# Patient Record
Sex: Male | Born: 1935 | Race: White | Hispanic: No | Marital: Married | Smoking: Former smoker
Health system: Southern US, Community
[De-identification: ages and names within clinical notes are randomized; demographics above are authoritative.]

## PROBLEM LIST (undated history)

## (undated) DIAGNOSIS — M1990 Unspecified osteoarthritis, unspecified site: Secondary | ICD-10-CM

## (undated) DIAGNOSIS — I340 Nonrheumatic mitral (valve) insufficiency: Secondary | ICD-10-CM

## (undated) DIAGNOSIS — K922 Gastrointestinal hemorrhage, unspecified: Secondary | ICD-10-CM

## (undated) DIAGNOSIS — I4821 Permanent atrial fibrillation: Secondary | ICD-10-CM

## (undated) DIAGNOSIS — J449 Chronic obstructive pulmonary disease, unspecified: Secondary | ICD-10-CM

## (undated) DIAGNOSIS — D649 Anemia, unspecified: Secondary | ICD-10-CM

## (undated) DIAGNOSIS — F419 Anxiety disorder, unspecified: Secondary | ICD-10-CM

## (undated) DIAGNOSIS — I5042 Chronic combined systolic (congestive) and diastolic (congestive) heart failure: Secondary | ICD-10-CM

## (undated) DIAGNOSIS — G629 Polyneuropathy, unspecified: Secondary | ICD-10-CM

## (undated) DIAGNOSIS — I639 Cerebral infarction, unspecified: Secondary | ICD-10-CM

## (undated) DIAGNOSIS — K31811 Angiodysplasia of stomach and duodenum with bleeding: Secondary | ICD-10-CM

## (undated) DIAGNOSIS — T7840XA Allergy, unspecified, initial encounter: Secondary | ICD-10-CM

## (undated) DIAGNOSIS — Z860100 Personal history of colon polyps, unspecified: Secondary | ICD-10-CM

## (undated) DIAGNOSIS — I2510 Atherosclerotic heart disease of native coronary artery without angina pectoris: Secondary | ICD-10-CM

## (undated) DIAGNOSIS — E785 Hyperlipidemia, unspecified: Secondary | ICD-10-CM

## (undated) DIAGNOSIS — I219 Acute myocardial infarction, unspecified: Secondary | ICD-10-CM

## (undated) HISTORY — DX: Nonrheumatic mitral (valve) insufficiency: I34.0

## (undated) HISTORY — PX: FOOT FRACTURE SURGERY: SHX645

## (undated) HISTORY — PX: MASTOIDECTOMY: SHX711

## (undated) HISTORY — PX: FRACTURE SURGERY: SHX138

## (undated) HISTORY — DX: Personal history of colonic polyps: Z86.010

## (undated) HISTORY — PX: CARPAL TUNNEL RELEASE: SHX101

## (undated) HISTORY — DX: Permanent atrial fibrillation: I48.21

## (undated) HISTORY — PX: PENILE PROSTHESIS IMPLANT: SHX240

## (undated) HISTORY — PX: KNEE ARTHROSCOPY: SUR90

## (undated) HISTORY — PX: COLONOSCOPY: SHX174

## (undated) HISTORY — DX: Anemia, unspecified: D64.9

## (undated) HISTORY — PX: SHOULDER ARTHROSCOPY: SHX128

## (undated) HISTORY — DX: Cerebral infarction, unspecified: I63.9

## (undated) HISTORY — DX: Anxiety disorder, unspecified: F41.9

## (undated) HISTORY — DX: Allergy, unspecified, initial encounter: T78.40XA

## (undated) HISTORY — PX: APPENDECTOMY: SHX54

## (undated) HISTORY — PX: NASAL SEPTUM SURGERY: SHX37

## (undated) HISTORY — DX: Hyperlipidemia, unspecified: E78.5

## (undated) HISTORY — DX: Chronic obstructive pulmonary disease, unspecified: J44.9

## (undated) HISTORY — DX: Unspecified osteoarthritis, unspecified site: M19.90

## (undated) HISTORY — PX: PENILE PROSTHESIS  REMOVAL: SHX2202

## (undated) HISTORY — DX: Chronic combined systolic (congestive) and diastolic (congestive) heart failure: I50.42

## (undated) HISTORY — DX: Personal history of colon polyps, unspecified: Z86.0100

## (undated) HISTORY — PX: TONSILLECTOMY AND ADENOIDECTOMY: SUR1326

## (undated) HISTORY — DX: Atherosclerotic heart disease of native coronary artery without angina pectoris: I25.10

## (undated) HISTORY — PX: PILONIDAL CYST / SINUS EXCISION: SUR543

---

## 1997-09-07 ENCOUNTER — Other Ambulatory Visit: Admission: RE | Admit: 1997-09-07 | Discharge: 1997-09-07 | Payer: Self-pay | Admitting: Geriatric Medicine

## 1999-11-28 ENCOUNTER — Encounter (INDEPENDENT_AMBULATORY_CARE_PROVIDER_SITE_OTHER): Payer: Self-pay | Admitting: *Deleted

## 1999-11-28 ENCOUNTER — Ambulatory Visit (HOSPITAL_COMMUNITY): Admission: RE | Admit: 1999-11-28 | Discharge: 1999-11-28 | Payer: Self-pay | Admitting: Gastroenterology

## 2000-06-11 DIAGNOSIS — M1990 Unspecified osteoarthritis, unspecified site: Secondary | ICD-10-CM

## 2000-06-11 HISTORY — DX: Unspecified osteoarthritis, unspecified site: M19.90

## 2001-04-04 ENCOUNTER — Encounter: Admission: RE | Admit: 2001-04-04 | Discharge: 2001-04-04 | Payer: Self-pay | Admitting: Geriatric Medicine

## 2001-04-04 ENCOUNTER — Encounter: Payer: Self-pay | Admitting: Geriatric Medicine

## 2004-07-01 ENCOUNTER — Encounter: Admission: RE | Admit: 2004-07-01 | Discharge: 2004-07-01 | Payer: Self-pay | Admitting: Orthopedic Surgery

## 2004-07-11 ENCOUNTER — Encounter: Admission: RE | Admit: 2004-07-11 | Discharge: 2004-07-11 | Payer: Self-pay | Admitting: Orthopedic Surgery

## 2004-07-13 ENCOUNTER — Ambulatory Visit (HOSPITAL_BASED_OUTPATIENT_CLINIC_OR_DEPARTMENT_OTHER): Admission: RE | Admit: 2004-07-13 | Discharge: 2004-07-13 | Payer: Self-pay | Admitting: Orthopedic Surgery

## 2004-07-13 ENCOUNTER — Ambulatory Visit (HOSPITAL_COMMUNITY): Admission: RE | Admit: 2004-07-13 | Discharge: 2004-07-13 | Payer: Self-pay | Admitting: Orthopedic Surgery

## 2004-07-18 ENCOUNTER — Encounter: Payer: Self-pay | Admitting: Internal Medicine

## 2004-07-27 ENCOUNTER — Ambulatory Visit (HOSPITAL_COMMUNITY): Admission: RE | Admit: 2004-07-27 | Discharge: 2004-07-27 | Payer: Self-pay | Admitting: Orthopedic Surgery

## 2004-07-27 ENCOUNTER — Ambulatory Visit (HOSPITAL_BASED_OUTPATIENT_CLINIC_OR_DEPARTMENT_OTHER): Admission: RE | Admit: 2004-07-27 | Discharge: 2004-07-27 | Payer: Self-pay | Admitting: Orthopedic Surgery

## 2004-09-20 ENCOUNTER — Ambulatory Visit (HOSPITAL_COMMUNITY): Admission: RE | Admit: 2004-09-20 | Discharge: 2004-09-20 | Payer: Self-pay | Admitting: Gastroenterology

## 2004-11-08 ENCOUNTER — Encounter: Payer: Self-pay | Admitting: Internal Medicine

## 2007-04-16 ENCOUNTER — Encounter: Payer: Self-pay | Admitting: Internal Medicine

## 2007-09-26 ENCOUNTER — Ambulatory Visit (HOSPITAL_COMMUNITY): Admission: RE | Admit: 2007-09-26 | Discharge: 2007-09-26 | Payer: Self-pay | Admitting: Geriatric Medicine

## 2008-04-14 ENCOUNTER — Ambulatory Visit: Payer: Self-pay | Admitting: Internal Medicine

## 2008-04-14 DIAGNOSIS — I10 Essential (primary) hypertension: Secondary | ICD-10-CM | POA: Insufficient documentation

## 2008-04-14 DIAGNOSIS — F39 Unspecified mood [affective] disorder: Secondary | ICD-10-CM

## 2008-04-14 DIAGNOSIS — J439 Emphysema, unspecified: Secondary | ICD-10-CM

## 2008-04-14 DIAGNOSIS — D649 Anemia, unspecified: Secondary | ICD-10-CM | POA: Insufficient documentation

## 2008-04-14 DIAGNOSIS — E785 Hyperlipidemia, unspecified: Secondary | ICD-10-CM | POA: Insufficient documentation

## 2008-04-14 DIAGNOSIS — I482 Chronic atrial fibrillation, unspecified: Secondary | ICD-10-CM

## 2008-04-14 DIAGNOSIS — I4891 Unspecified atrial fibrillation: Secondary | ICD-10-CM | POA: Insufficient documentation

## 2008-04-14 DIAGNOSIS — I2510 Atherosclerotic heart disease of native coronary artery without angina pectoris: Secondary | ICD-10-CM | POA: Insufficient documentation

## 2008-04-16 ENCOUNTER — Encounter: Payer: Self-pay | Admitting: Internal Medicine

## 2008-04-19 LAB — CONVERTED CEMR LAB
ALT: 30 units/L (ref 0–53)
AST: 32 units/L (ref 0–37)
Albumin: 4.3 g/dL (ref 3.5–5.2)
Alkaline Phosphatase: 52 units/L (ref 39–117)
BUN: 20 mg/dL (ref 6–23)
Basophils Absolute: 0 10*3/uL (ref 0.0–0.1)
Basophils Relative: 0.3 % (ref 0.0–3.0)
Bilirubin, Direct: 0.1 mg/dL (ref 0.0–0.3)
CO2: 31 meq/L (ref 19–32)
Calcium: 10.1 mg/dL (ref 8.4–10.5)
Chloride: 104 meq/L (ref 96–112)
Cholesterol: 123 mg/dL (ref 0–200)
Creatinine, Ser: 1 mg/dL (ref 0.4–1.5)
Direct LDL: 62.4 mg/dL
Eosinophils Absolute: 0.3 10*3/uL (ref 0.0–0.7)
Eosinophils Relative: 5.2 % — ABNORMAL HIGH (ref 0.0–5.0)
GFR calc Af Amer: 94 mL/min
GFR calc non Af Amer: 78 mL/min
Glucose, Bld: 85 mg/dL (ref 70–99)
HCT: 38.8 % — ABNORMAL LOW (ref 39.0–52.0)
HDL: 32.8 mg/dL — ABNORMAL LOW (ref >39.0)
Hemoglobin: 13.1 g/dL (ref 13.0–17.0)
Lymphocytes Relative: 29.3 % (ref 12.0–46.0)
MCHC: 33.9 g/dL (ref 30.0–36.0)
MCV: 87.6 fL (ref 78.0–100.0)
Monocytes Absolute: 0.5 10*3/uL (ref 0.1–1.0)
Monocytes Relative: 8.7 % (ref 3.0–12.0)
Neutro Abs: 3.5 10*3/uL (ref 1.4–7.7)
Neutrophils Relative %: 56.5 % (ref 43.0–77.0)
Phosphorus: 3.9 mg/dL (ref 2.3–4.6)
Platelets: 225 10*3/uL (ref 150–400)
Potassium: 4.8 meq/L (ref 3.5–5.1)
RBC: 4.43 M/uL (ref 4.22–5.81)
RDW: 13.1 % (ref 11.5–14.6)
Sodium: 141 meq/L (ref 135–145)
TSH: 1.18 microintl units/mL (ref 0.35–5.50)
Total Bilirubin: 0.6 mg/dL (ref 0.3–1.2)
Total CHOL/HDL Ratio: 3.8
Total Protein: 7.2 g/dL (ref 6.0–8.3)
Triglycerides: 625 mg/dL (ref 0–149)
VLDL: 125 mg/dL — ABNORMAL HIGH (ref 0–40)
WBC: 6.1 10*3/uL (ref 4.5–10.5)

## 2008-06-21 ENCOUNTER — Emergency Department: Payer: Self-pay | Admitting: Emergency Medicine

## 2008-07-02 ENCOUNTER — Ambulatory Visit: Payer: Self-pay | Admitting: Internal Medicine

## 2008-08-20 ENCOUNTER — Ambulatory Visit: Payer: Self-pay | Admitting: Family Medicine

## 2008-11-11 ENCOUNTER — Ambulatory Visit: Payer: Self-pay | Admitting: Internal Medicine

## 2008-11-11 DIAGNOSIS — J309 Allergic rhinitis, unspecified: Secondary | ICD-10-CM

## 2008-11-16 ENCOUNTER — Ambulatory Visit: Payer: Self-pay | Admitting: Internal Medicine

## 2008-11-17 LAB — CONVERTED CEMR LAB
ALT: 23 units/L (ref 0–53)
AST: 33 units/L (ref 0–37)
Albumin: 4.2 g/dL (ref 3.5–5.2)
Alkaline Phosphatase: 39 units/L (ref 39–117)
BUN: 22 mg/dL (ref 6–23)
Basophils Absolute: 0 10*3/uL (ref 0.0–0.1)
Basophils Relative: 0 % (ref 0.0–3.0)
Bilirubin, Direct: 0.1 mg/dL (ref 0.0–0.3)
CO2: 31 meq/L (ref 19–32)
Calcium: 9.5 mg/dL (ref 8.4–10.5)
Chloride: 110 meq/L (ref 96–112)
Cholesterol: 139 mg/dL (ref 0–200)
Creatinine, Ser: 1.1 mg/dL (ref 0.4–1.5)
Direct LDL: 82.1 mg/dL
Eosinophils Absolute: 0.4 10*3/uL (ref 0.0–0.7)
Eosinophils Relative: 5.8 % — ABNORMAL HIGH (ref 0.0–5.0)
Glucose, Bld: 82 mg/dL (ref 70–99)
HCT: 35.3 % — ABNORMAL LOW (ref 39.0–52.0)
HDL: 41.7 mg/dL (ref >39.00)
Hemoglobin: 12.5 g/dL — ABNORMAL LOW (ref 13.0–17.0)
Lymphocytes Relative: 25.4 % (ref 12.0–46.0)
Lymphs Abs: 1.9 10*3/uL (ref 0.7–4.0)
MCHC: 35.4 g/dL (ref 30.0–36.0)
MCV: 85.3 fL (ref 78.0–100.0)
Monocytes Absolute: 0.6 10*3/uL (ref 0.1–1.0)
Monocytes Relative: 8.5 % (ref 3.0–12.0)
Neutro Abs: 4.7 10*3/uL (ref 1.4–7.7)
Neutrophils Relative %: 60.3 % (ref 43.0–77.0)
Phosphorus: 3 mg/dL (ref 2.3–4.6)
Platelets: 228 10*3/uL (ref 150.0–400.0)
Potassium: 4.9 meq/L (ref 3.5–5.1)
RBC: 4.13 M/uL — ABNORMAL LOW (ref 4.22–5.81)
RDW: 13.6 % (ref 11.5–14.6)
Sodium: 143 meq/L (ref 135–145)
Total Bilirubin: 0.7 mg/dL (ref 0.3–1.2)
Total CHOL/HDL Ratio: 3
Total Protein: 6.9 g/dL (ref 6.0–8.3)
Triglycerides: 671 mg/dL — ABNORMAL HIGH (ref 0.0–149.0)
VLDL: 134.2 mg/dL — ABNORMAL HIGH (ref 0.0–40.0)
WBC: 7.6 10*3/uL (ref 4.5–10.5)

## 2009-02-11 ENCOUNTER — Telehealth: Payer: Self-pay | Admitting: Internal Medicine

## 2009-05-31 ENCOUNTER — Ambulatory Visit: Payer: Self-pay | Admitting: Internal Medicine

## 2009-05-31 DIAGNOSIS — M1990 Unspecified osteoarthritis, unspecified site: Secondary | ICD-10-CM | POA: Insufficient documentation

## 2009-06-02 LAB — CONVERTED CEMR LAB
ALT: 20 units/L (ref 0–53)
AST: 28 units/L (ref 0–37)
Albumin: 4.3 g/dL (ref 3.5–5.2)
Alkaline Phosphatase: 38 units/L — ABNORMAL LOW (ref 39–117)
BUN: 25 mg/dL — ABNORMAL HIGH (ref 6–23)
Basophils Absolute: 0 10*3/uL (ref 0.0–0.1)
Basophils Relative: 0.3 % (ref 0.0–3.0)
Bilirubin, Direct: 0 mg/dL (ref 0.0–0.3)
CO2: 30 meq/L (ref 19–32)
Calcium: 9.8 mg/dL (ref 8.4–10.5)
Chloride: 101 meq/L (ref 96–112)
Cholesterol: 134 mg/dL (ref 0–200)
Creatinine, Ser: 1.1 mg/dL (ref 0.4–1.5)
Direct LDL: 68.2 mg/dL
Eosinophils Absolute: 0.4 10*3/uL (ref 0.0–0.7)
Eosinophils Relative: 5 % (ref 0.0–5.0)
GFR calc non Af Amer: 69.16 mL/min (ref >60)
Glucose, Bld: 76 mg/dL (ref 70–99)
HCT: 36.7 % — ABNORMAL LOW (ref 39.0–52.0)
HDL: 46.6 mg/dL (ref >39.00)
Hemoglobin: 12.2 g/dL — ABNORMAL LOW (ref 13.0–17.0)
Lymphocytes Relative: 21.1 % (ref 12.0–46.0)
Lymphs Abs: 1.7 10*3/uL (ref 0.7–4.0)
MCHC: 33.3 g/dL (ref 30.0–36.0)
MCV: 90.2 fL (ref 78.0–100.0)
Monocytes Absolute: 0.7 10*3/uL (ref 0.1–1.0)
Monocytes Relative: 8.8 % (ref 3.0–12.0)
Neutro Abs: 5.2 10*3/uL (ref 1.4–7.7)
Neutrophils Relative %: 64.8 % (ref 43.0–77.0)
Phosphorus: 3.8 mg/dL (ref 2.3–4.6)
Platelets: 254 10*3/uL (ref 150.0–400.0)
Potassium: 5.2 meq/L — ABNORMAL HIGH (ref 3.5–5.1)
RBC: 4.07 M/uL — ABNORMAL LOW (ref 4.22–5.81)
RDW: 13 % (ref 11.5–14.6)
Sodium: 139 meq/L (ref 135–145)
TSH: 1.28 microintl units/mL (ref 0.35–5.50)
Total Bilirubin: 0.7 mg/dL (ref 0.3–1.2)
Total CHOL/HDL Ratio: 3
Total Protein: 6.6 g/dL (ref 6.0–8.3)
Triglycerides: 740 mg/dL — ABNORMAL HIGH (ref 0.0–149.0)
VLDL: 148 mg/dL — ABNORMAL HIGH (ref 0.0–40.0)
WBC: 8 10*3/uL (ref 4.5–10.5)

## 2009-07-04 ENCOUNTER — Ambulatory Visit: Payer: Self-pay | Admitting: Internal Medicine

## 2009-07-29 ENCOUNTER — Telehealth: Payer: Self-pay | Admitting: Internal Medicine

## 2009-08-02 ENCOUNTER — Encounter: Payer: Self-pay | Admitting: Internal Medicine

## 2009-09-28 ENCOUNTER — Encounter: Payer: Self-pay | Admitting: Internal Medicine

## 2009-12-06 ENCOUNTER — Ambulatory Visit: Payer: Self-pay | Admitting: Internal Medicine

## 2009-12-06 DIAGNOSIS — Z860100 Personal history of colon polyps, unspecified: Secondary | ICD-10-CM | POA: Insufficient documentation

## 2009-12-08 LAB — CONVERTED CEMR LAB
ALT: 20 units/L (ref 0–53)
AST: 26 units/L (ref 0–37)
Albumin: 4.3 g/dL (ref 3.5–5.2)
Alkaline Phosphatase: 51 units/L (ref 39–117)
BUN: 24 mg/dL — ABNORMAL HIGH (ref 6–23)
Basophils Absolute: 0 10*3/uL (ref 0.0–0.1)
Basophils Relative: 0.5 % (ref 0.0–3.0)
Bilirubin, Direct: 0.2 mg/dL (ref 0.0–0.3)
CO2: 31 meq/L (ref 19–32)
Calcium: 9.8 mg/dL (ref 8.4–10.5)
Chloride: 107 meq/L (ref 96–112)
Cholesterol: 136 mg/dL (ref 0–200)
Creatinine, Ser: 1 mg/dL (ref 0.4–1.5)
Direct LDL: 70.1 mg/dL
Eosinophils Absolute: 0.4 10*3/uL (ref 0.0–0.7)
Eosinophils Relative: 5.3 % — ABNORMAL HIGH (ref 0.0–5.0)
GFR calc non Af Amer: 77.99 mL/min (ref >60)
Glucose, Bld: 80 mg/dL (ref 70–99)
HCT: 36.1 % — ABNORMAL LOW (ref 39.0–52.0)
HDL: 45.7 mg/dL (ref >39.00)
Hemoglobin: 12.3 g/dL — ABNORMAL LOW (ref 13.0–17.0)
Lymphocytes Relative: 19.2 % (ref 12.0–46.0)
Lymphs Abs: 1.4 10*3/uL (ref 0.7–4.0)
MCHC: 33.9 g/dL (ref 30.0–36.0)
MCV: 88.9 fL (ref 78.0–100.0)
Monocytes Absolute: 0.7 10*3/uL (ref 0.1–1.0)
Monocytes Relative: 9.7 % (ref 3.0–12.0)
Neutro Abs: 4.8 10*3/uL (ref 1.4–7.7)
Neutrophils Relative %: 65.3 % (ref 43.0–77.0)
Phosphorus: 3.8 mg/dL (ref 2.3–4.6)
Platelets: 229 10*3/uL (ref 150.0–400.0)
Potassium: 5 meq/L (ref 3.5–5.1)
RBC: 4.06 M/uL — ABNORMAL LOW (ref 4.22–5.81)
RDW: 14.5 % (ref 11.5–14.6)
Sodium: 142 meq/L (ref 135–145)
TSH: 1.11 microintl units/mL (ref 0.35–5.50)
Total Bilirubin: 0.5 mg/dL (ref 0.3–1.2)
Total CHOL/HDL Ratio: 3
Total Protein: 7.1 g/dL (ref 6.0–8.3)
Triglycerides: 714 mg/dL — ABNORMAL HIGH (ref 0.0–149.0)
VLDL: 142.8 mg/dL — ABNORMAL HIGH (ref 0.0–40.0)
WBC: 7.4 10*3/uL (ref 4.5–10.5)

## 2010-01-17 ENCOUNTER — Ambulatory Visit: Payer: Self-pay | Admitting: Gastroenterology

## 2010-02-16 ENCOUNTER — Telehealth: Payer: Self-pay | Admitting: Gastroenterology

## 2010-02-17 ENCOUNTER — Ambulatory Visit: Payer: Self-pay | Admitting: Gastroenterology

## 2010-02-22 ENCOUNTER — Encounter: Payer: Self-pay | Admitting: Gastroenterology

## 2010-05-30 ENCOUNTER — Encounter: Payer: Self-pay | Admitting: Internal Medicine

## 2010-06-06 ENCOUNTER — Ambulatory Visit: Payer: Self-pay | Admitting: Internal Medicine

## 2010-06-06 DIAGNOSIS — R21 Rash and other nonspecific skin eruption: Secondary | ICD-10-CM

## 2010-06-11 DIAGNOSIS — I2510 Atherosclerotic heart disease of native coronary artery without angina pectoris: Secondary | ICD-10-CM

## 2010-06-11 DIAGNOSIS — I4821 Permanent atrial fibrillation: Secondary | ICD-10-CM

## 2010-06-11 HISTORY — DX: Permanent atrial fibrillation: I48.21

## 2010-06-11 HISTORY — DX: Atherosclerotic heart disease of native coronary artery without angina pectoris: I25.10

## 2010-06-11 HISTORY — PX: CARDIAC CATHETERIZATION: SHX172

## 2010-06-15 ENCOUNTER — Inpatient Hospital Stay (HOSPITAL_COMMUNITY)
Admission: RE | Admit: 2010-06-15 | Discharge: 2010-06-22 | Payer: Self-pay | Source: Home / Self Care | Attending: Interventional Cardiology | Admitting: Interventional Cardiology

## 2010-06-15 LAB — CARDIAC PANEL(CRET KIN+CKTOT+MB+TROPI)
CK, MB: 16.6 ng/mL (ref 0.3–4.0)
Relative Index: 8.7 — ABNORMAL HIGH (ref 0.0–2.5)
Total CK: 190 U/L (ref 7–232)
Troponin I: 0.25 ng/mL — ABNORMAL HIGH (ref 0.00–0.06)

## 2010-06-15 LAB — PROTIME-INR
INR: 1.12 (ref 0.00–1.49)
Prothrombin Time: 14.6 seconds (ref 11.6–15.2)

## 2010-06-15 LAB — MRSA PCR SCREENING: MRSA by PCR: NEGATIVE

## 2010-06-15 LAB — APTT: aPTT: 31 seconds (ref 24–37)

## 2010-06-16 LAB — BASIC METABOLIC PANEL
BUN: 25 mg/dL — ABNORMAL HIGH (ref 6–23)
CO2: 25 mEq/L (ref 19–32)
Calcium: 9.4 mg/dL (ref 8.4–10.5)
Chloride: 105 mEq/L (ref 96–112)
Creatinine, Ser: 1.11 mg/dL (ref 0.4–1.5)
GFR calc Af Amer: >60 mL/min (ref >60)
GFR calc non Af Amer: >60 mL/min (ref >60)
Glucose, Bld: 108 mg/dL — ABNORMAL HIGH (ref 70–99)
Potassium: 4 mEq/L (ref 3.5–5.1)
Sodium: 138 mEq/L (ref 135–145)

## 2010-06-16 LAB — CBC
HCT: 34.4 % — ABNORMAL LOW (ref 39.0–52.0)
Hemoglobin: 11.1 g/dL — ABNORMAL LOW (ref 13.0–17.0)
MCH: 28.3 pg (ref 26.0–34.0)
MCHC: 32.3 g/dL (ref 30.0–36.0)
MCV: 87.8 fL (ref 78.0–100.0)
Platelets: 220 10*3/uL (ref 150–400)
RBC: 3.92 MIL/uL — ABNORMAL LOW (ref 4.22–5.81)
RDW: 14 % (ref 11.5–15.5)
WBC: 10.8 10*3/uL — ABNORMAL HIGH (ref 4.0–10.5)

## 2010-06-16 LAB — CARDIAC PANEL(CRET KIN+CKTOT+MB+TROPI)
CK, MB: 199.3 ng/mL (ref 0.3–4.0)
CK, MB: 59.5 ng/mL (ref 0.3–4.0)
Relative Index: 15.7 — ABNORMAL HIGH (ref 0.0–2.5)
Relative Index: 17.4 — ABNORMAL HIGH (ref 0.0–2.5)
Total CK: 1146 U/L — ABNORMAL HIGH (ref 7–232)
Total CK: 379 U/L — ABNORMAL HIGH (ref 7–232)
Troponin I: 1.63 ng/mL (ref 0.00–0.06)
Troponin I: 18.56 ng/mL (ref 0.00–0.06)

## 2010-06-16 LAB — HEPARIN LEVEL (UNFRACTIONATED)
Heparin Unfractionated: 0.25 IU/mL — ABNORMAL LOW (ref 0.30–0.70)
Heparin Unfractionated: 0.38 IU/mL (ref 0.30–0.70)
Heparin Unfractionated: <0.1 IU/mL — ABNORMAL LOW (ref 0.30–0.70)

## 2010-06-26 LAB — PROTIME-INR
INR: 1.83 — ABNORMAL HIGH (ref 0.00–1.49)
INR: 1.92 — ABNORMAL HIGH (ref 0.00–1.49)
INR: 1.86 — ABNORMAL HIGH (ref 0.00–1.49)
INR: 1.92 — ABNORMAL HIGH (ref 0.00–1.49)
INR: 2.52 — ABNORMAL HIGH (ref 0.00–1.49)
Prothrombin Time: 21.3 seconds — ABNORMAL HIGH (ref 11.6–15.2)
Prothrombin Time: 22.1 seconds — ABNORMAL HIGH (ref 11.6–15.2)
Prothrombin Time: 21.6 seconds — ABNORMAL HIGH (ref 11.6–15.2)
Prothrombin Time: 22.1 seconds — ABNORMAL HIGH (ref 11.6–15.2)
Prothrombin Time: 27.3 seconds — ABNORMAL HIGH (ref 11.6–15.2)

## 2010-06-26 LAB — CK TOTAL AND CKMB (NOT AT ARMC)
CK, MB: 107.9 ng/mL (ref 0.3–4.0)
CK, MB: 92.9 ng/mL (ref 0.3–4.0)
CK, MB: 19.8 ng/mL (ref 0.3–4.0)
Relative Index: 10.1 — ABNORMAL HIGH (ref 0.0–2.5)
Relative Index: 8.8 — ABNORMAL HIGH (ref 0.0–2.5)
Relative Index: 4 — ABNORMAL HIGH (ref 0.0–2.5)
Total CK: 1057 U/L — ABNORMAL HIGH (ref 7–232)
Total CK: 1069 U/L — ABNORMAL HIGH (ref 7–232)
Total CK: 492 U/L — ABNORMAL HIGH (ref 7–232)

## 2010-06-26 LAB — DIFFERENTIAL
Basophils Absolute: 0 10*3/uL (ref 0.0–0.1)
Basophils Absolute: 0 10*3/uL (ref 0.0–0.1)
Basophils Absolute: 0 10*3/uL (ref 0.0–0.1)
Basophils Relative: 0 % (ref 0–1)
Basophils Relative: 0 % (ref 0–1)
Basophils Relative: 0 % (ref 0–1)
Eosinophils Absolute: 0 10*3/uL (ref 0.0–0.7)
Eosinophils Absolute: 0 10*3/uL (ref 0.0–0.7)
Eosinophils Absolute: 0.2 10*3/uL (ref 0.0–0.7)
Eosinophils Relative: 0 % (ref 0–5)
Eosinophils Relative: 0 % (ref 0–5)
Eosinophils Relative: 1 % (ref 0–5)
Lymphocytes Relative: 7 % — ABNORMAL LOW (ref 12–46)
Lymphocytes Relative: 4 % — ABNORMAL LOW (ref 12–46)
Lymphocytes Relative: 8 % — ABNORMAL LOW (ref 12–46)
Lymphs Abs: 1.5 10*3/uL (ref 0.7–4.0)
Lymphs Abs: 0.7 10*3/uL (ref 0.7–4.0)
Lymphs Abs: 1 10*3/uL (ref 0.7–4.0)
Monocytes Absolute: 2.1 10*3/uL — ABNORMAL HIGH (ref 0.1–1.0)
Monocytes Absolute: 1.7 10*3/uL — ABNORMAL HIGH (ref 0.1–1.0)
Monocytes Absolute: 1.6 10*3/uL — ABNORMAL HIGH (ref 0.1–1.0)
Monocytes Relative: 10 % (ref 3–12)
Monocytes Relative: 11 % (ref 3–12)
Monocytes Relative: 13 % — ABNORMAL HIGH (ref 3–12)
Neutro Abs: 16.8 10*3/uL — ABNORMAL HIGH (ref 1.7–7.7)
Neutro Abs: 12.6 10*3/uL — ABNORMAL HIGH (ref 1.7–7.7)
Neutro Abs: 9.5 10*3/uL — ABNORMAL HIGH (ref 1.7–7.7)
Neutrophils Relative %: 82 % — ABNORMAL HIGH (ref 43–77)
Neutrophils Relative %: 84 % — ABNORMAL HIGH (ref 43–77)
Neutrophils Relative %: 77 % (ref 43–77)

## 2010-06-26 LAB — CBC
HCT: 39.3 % (ref 39.0–52.0)
HCT: 32.5 % — ABNORMAL LOW (ref 39.0–52.0)
HCT: 31.5 % — ABNORMAL LOW (ref 39.0–52.0)
HCT: 35.5 % — ABNORMAL LOW (ref 39.0–52.0)
HCT: 35.8 % — ABNORMAL LOW (ref 39.0–52.0)
HCT: 37.8 % — ABNORMAL LOW (ref 39.0–52.0)
Hemoglobin: 12.9 g/dL — ABNORMAL LOW (ref 13.0–17.0)
Hemoglobin: 11 g/dL — ABNORMAL LOW (ref 13.0–17.0)
Hemoglobin: 10.6 g/dL — ABNORMAL LOW (ref 13.0–17.0)
Hemoglobin: 12 g/dL — ABNORMAL LOW (ref 13.0–17.0)
Hemoglobin: 12.2 g/dL — ABNORMAL LOW (ref 13.0–17.0)
Hemoglobin: 12.4 g/dL — ABNORMAL LOW (ref 13.0–17.0)
MCH: 29.1 pg (ref 26.0–34.0)
MCH: 29.3 pg (ref 26.0–34.0)
MCH: 28.9 pg (ref 26.0–34.0)
MCH: 28.6 pg (ref 26.0–34.0)
MCH: 29.1 pg (ref 26.0–34.0)
MCH: 28.9 pg (ref 26.0–34.0)
MCHC: 32.8 g/dL (ref 30.0–36.0)
MCHC: 33.8 g/dL (ref 30.0–36.0)
MCHC: 33.7 g/dL (ref 30.0–36.0)
MCHC: 33.8 g/dL (ref 30.0–36.0)
MCHC: 34.1 g/dL (ref 30.0–36.0)
MCHC: 32.8 g/dL (ref 30.0–36.0)
MCV: 88.5 fL (ref 78.0–100.0)
MCV: 86.7 fL (ref 78.0–100.0)
MCV: 85.8 fL (ref 78.0–100.0)
MCV: 84.5 fL (ref 78.0–100.0)
MCV: 85.4 fL (ref 78.0–100.0)
MCV: 88.1 fL (ref 78.0–100.0)
Platelets: 234 10*3/uL (ref 150–400)
Platelets: 168 10*3/uL (ref 150–400)
Platelets: 168 10*3/uL (ref 150–400)
Platelets: 223 10*3/uL (ref 150–400)
Platelets: 226 10*3/uL (ref 150–400)
Platelets: 255 10*3/uL (ref 150–400)
RBC: 4.44 MIL/uL (ref 4.22–5.81)
RBC: 3.75 MIL/uL — ABNORMAL LOW (ref 4.22–5.81)
RBC: 3.67 MIL/uL — ABNORMAL LOW (ref 4.22–5.81)
RBC: 4.2 MIL/uL — ABNORMAL LOW (ref 4.22–5.81)
RBC: 4.19 MIL/uL — ABNORMAL LOW (ref 4.22–5.81)
RBC: 4.29 MIL/uL (ref 4.22–5.81)
RDW: 14.2 % (ref 11.5–15.5)
RDW: 13.9 % (ref 11.5–15.5)
RDW: 13.6 % (ref 11.5–15.5)
RDW: 13.4 % (ref 11.5–15.5)
RDW: 13.6 % (ref 11.5–15.5)
RDW: 14 % (ref 11.5–15.5)
WBC: 22.2 10*3/uL — ABNORMAL HIGH (ref 4.0–10.5)
WBC: 20.5 10*3/uL — ABNORMAL HIGH (ref 4.0–10.5)
WBC: 15 10*3/uL — ABNORMAL HIGH (ref 4.0–10.5)
WBC: 12.3 10*3/uL — ABNORMAL HIGH (ref 4.0–10.5)
WBC: 10.2 10*3/uL (ref 4.0–10.5)
WBC: 12.1 10*3/uL — ABNORMAL HIGH (ref 4.0–10.5)

## 2010-06-26 LAB — COMPREHENSIVE METABOLIC PANEL
ALT: 2369 U/L — ABNORMAL HIGH (ref 0–53)
AST: 2565 U/L — ABNORMAL HIGH (ref 0–37)
Albumin: 3.6 g/dL (ref 3.5–5.2)
Alkaline Phosphatase: 82 U/L (ref 39–117)
BUN: 74 mg/dL — ABNORMAL HIGH (ref 6–23)
CO2: 19 mEq/L (ref 19–32)
Calcium: 8.6 mg/dL (ref 8.4–10.5)
Chloride: 101 mEq/L (ref 96–112)
Creatinine, Ser: 4.76 mg/dL — ABNORMAL HIGH (ref 0.4–1.5)
GFR calc Af Amer: 14 mL/min — ABNORMAL LOW (ref >60)
GFR calc non Af Amer: 12 mL/min — ABNORMAL LOW (ref >60)
Glucose, Bld: 102 mg/dL — ABNORMAL HIGH (ref 70–99)
Potassium: 4.3 mEq/L (ref 3.5–5.1)
Sodium: 132 mEq/L — ABNORMAL LOW (ref 135–145)
Total Bilirubin: 0.9 mg/dL (ref 0.3–1.2)
Total Protein: 6.2 g/dL (ref 6.0–8.3)

## 2010-06-26 LAB — BASIC METABOLIC PANEL
BUN: 41 mg/dL — ABNORMAL HIGH (ref 6–23)
BUN: 43 mg/dL — ABNORMAL HIGH (ref 6–23)
BUN: 64 mg/dL — ABNORMAL HIGH (ref 6–23)
BUN: 66 mg/dL — ABNORMAL HIGH (ref 6–23)
BUN: 52 mg/dL — ABNORMAL HIGH (ref 6–23)
BUN: 37 mg/dL — ABNORMAL HIGH (ref 6–23)
CO2: 21 mEq/L (ref 19–32)
CO2: 22 mEq/L (ref 19–32)
CO2: 19 mEq/L (ref 19–32)
CO2: 32 mEq/L (ref 19–32)
CO2: 31 mEq/L (ref 19–32)
CO2: 32 mEq/L (ref 19–32)
Calcium: 9.2 mg/dL (ref 8.4–10.5)
Calcium: 8.7 mg/dL (ref 8.4–10.5)
Calcium: 8.4 mg/dL (ref 8.4–10.5)
Calcium: 9.9 mg/dL (ref 8.4–10.5)
Calcium: 9.5 mg/dL (ref 8.4–10.5)
Calcium: 9.8 mg/dL (ref 8.4–10.5)
Chloride: 103 mEq/L (ref 96–112)
Chloride: 103 mEq/L (ref 96–112)
Chloride: 101 mEq/L (ref 96–112)
Chloride: 96 mEq/L (ref 96–112)
Chloride: 97 mEq/L (ref 96–112)
Chloride: 95 mEq/L — ABNORMAL LOW (ref 96–112)
Creatinine, Ser: 3.15 mg/dL — ABNORMAL HIGH (ref 0.4–1.5)
Creatinine, Ser: 2.92 mg/dL — ABNORMAL HIGH (ref 0.4–1.5)
Creatinine, Ser: 4.35 mg/dL — ABNORMAL HIGH (ref 0.4–1.5)
Creatinine, Ser: 3.41 mg/dL — ABNORMAL HIGH (ref 0.4–1.5)
Creatinine, Ser: 2.42 mg/dL — ABNORMAL HIGH (ref 0.4–1.5)
Creatinine, Ser: 1.75 mg/dL — ABNORMAL HIGH (ref 0.4–1.5)
GFR calc Af Amer: 23 mL/min — ABNORMAL LOW (ref >60)
GFR calc Af Amer: 25 mL/min — ABNORMAL LOW (ref >60)
GFR calc Af Amer: 16 mL/min — ABNORMAL LOW (ref >60)
GFR calc Af Amer: 21 mL/min — ABNORMAL LOW (ref >60)
GFR calc Af Amer: 32 mL/min — ABNORMAL LOW (ref >60)
GFR calc Af Amer: 46 mL/min — ABNORMAL LOW (ref >60)
GFR calc non Af Amer: 19 mL/min — ABNORMAL LOW (ref >60)
GFR calc non Af Amer: 21 mL/min — ABNORMAL LOW (ref >60)
GFR calc non Af Amer: 13 mL/min — ABNORMAL LOW (ref >60)
GFR calc non Af Amer: 18 mL/min — ABNORMAL LOW (ref >60)
GFR calc non Af Amer: 26 mL/min — ABNORMAL LOW (ref >60)
GFR calc non Af Amer: 38 mL/min — ABNORMAL LOW (ref >60)
Glucose, Bld: 122 mg/dL — ABNORMAL HIGH (ref 70–99)
Glucose, Bld: 152 mg/dL — ABNORMAL HIGH (ref 70–99)
Glucose, Bld: 98 mg/dL (ref 70–99)
Glucose, Bld: 119 mg/dL — ABNORMAL HIGH (ref 70–99)
Glucose, Bld: 101 mg/dL — ABNORMAL HIGH (ref 70–99)
Glucose, Bld: 137 mg/dL — ABNORMAL HIGH (ref 70–99)
Potassium: 5.8 mEq/L — ABNORMAL HIGH (ref 3.5–5.1)
Potassium: 5.2 mEq/L — ABNORMAL HIGH (ref 3.5–5.1)
Potassium: 4.8 mEq/L (ref 3.5–5.1)
Potassium: 3.4 mEq/L — ABNORMAL LOW (ref 3.5–5.1)
Potassium: 4 mEq/L (ref 3.5–5.1)
Potassium: 3.9 mEq/L (ref 3.5–5.1)
Sodium: 138 mEq/L (ref 135–145)
Sodium: 135 mEq/L (ref 135–145)
Sodium: 130 mEq/L — ABNORMAL LOW (ref 135–145)
Sodium: 138 mEq/L (ref 135–145)
Sodium: 137 mEq/L (ref 135–145)
Sodium: 135 mEq/L (ref 135–145)

## 2010-06-26 LAB — HEMOCCULT GUIAC POC 1CARD (OFFICE): Fecal Occult Bld: POSITIVE

## 2010-06-26 LAB — URINE MICROSCOPIC-ADD ON

## 2010-06-26 LAB — CREATININE, URINE, RANDOM: Creatinine, Urine: 72.5 mg/dL

## 2010-06-26 LAB — URINALYSIS, ROUTINE W REFLEX MICROSCOPIC
Bilirubin Urine: NEGATIVE
Ketones, ur: NEGATIVE mg/dL
Leukocytes, UA: NEGATIVE
Nitrite: NEGATIVE
Protein, ur: NEGATIVE mg/dL
Specific Gravity, Urine: 1.018 (ref 1.005–1.030)
Urine Glucose, Fasting: NEGATIVE mg/dL
Urobilinogen, UA: 0.2 mg/dL (ref 0.0–1.0)
pH: 5 (ref 5.0–8.0)

## 2010-06-26 LAB — TROPONIN I: Troponin I: 35.87 ng/mL (ref 0.00–0.06)

## 2010-06-26 LAB — SODIUM, URINE, RANDOM: Sodium, Ur: 13 mEq/L

## 2010-06-26 LAB — BRAIN NATRIURETIC PEPTIDE
Pro B Natriuretic peptide (BNP): 477 pg/mL — ABNORMAL HIGH (ref 0.0–100.0)
Pro B Natriuretic peptide (BNP): 748 pg/mL — ABNORMAL HIGH (ref 0.0–100.0)

## 2010-07-02 NOTE — Consult Note (Signed)
Eduardo [name], Eduardo [name]            ACCOUNT NO.:  [id number]  MEDICAL RECORD NO.:  [id number]          PATIENT TYPE:  INP  LOCATION:  2903                         FACILITY:  [facility]  PHYSICIAN:  Sol Blazing, M.D.DATE OF BIRTH:  [birth year]  DATE OF CONSULTATION:  06/18/2010 DATE OF DISCHARGE:                                CONSULTATION   REFERRING PHYSICIAN:  Belva Crome, MD  REASON FOR CONSULTATION:  Acute renal failure.  HISTORY OF PRESENT ILLNESS:  This is a 77 year old with history of atrial fibrillation and hyperlipidemia who had chronic dyspnea on exertion.  The patient was admitted for heart catheterization to rule out ischemia in a patient without clinical evidence of significant lung disease debilitated by dyspnea.  The patient underwent heart catheterization on June 15, 2010.  There was evidence of diastolic heart failure, moderate coronary disease with 50-70% stenosis of the right coronary artery and moderate LAD disease but without severe stenosis.  Procedure was complicated by right coronary artery dissection.  The patient had subsequent ST-elevation MI with positive cardiac enzymes.  He developed acute renal failure as well as bradycardia.  He required another trip to the cath lab on June 16, 2010 for aortogram and repeat coronary angiography.  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  Temporary pacemaker was placed during that second procedure as well, for bradycardia.  Beta-blocker and diltiazem were held and heart rate was stabilized.  The creatinine was 1.1 on admission on June 16, 2010, now up to 3.1 yesterday in the morning and up to 4.35 today in the morning.  The patient is getting fluids.  Cardiac enzymes have been positive.  The patient currently is complaining of dyspnea.  He denies any chest pain, fever, chills, sweats, headache, visual change, sore throat, difficulty swallowing,  abdominal pain, nausea, vomiting, or diarrhea. He does not have a Foley catheter in yet.  No joint pain or swelling. No skin rash or itching.  He denies any history of kidney disease.  PAST MEDICAL HISTORY: 1. Chronic atrial fibrillation. 2. History of coronary artery disease. 3. Hyperlipidemia.  PAST SURGICAL HISTORY:  Mastoid operation remotely.  CURRENT MEDICATIONS: 1. Wellbutrin. 2. Plavix. 3. Lexapro. 4. Zetia. 5. Lopressor 12.5 b.i.d. 6. Lovaza. 7. Crestor. 8. Coumadin. 9. Xanax p.r.n.  ALLERGIES:  SULFA and PENICILLIN medications.  SOCIAL HISTORY:  Noncontributory.  REVIEW OF SYSTEMS:  As above.  PHYSICAL EXAMINATION:  VITAL SIGNS:  Blood pressure is 129/82.  Lowest blood pressure was 89/30, AFib rate of 86, respirations 18, and O2 sat 95% on 2 L.  Dopamine is going at 3 mcg/kg/min.  Urine output has been reasonable today with 1015 mL out.  There was 626 mL out yesterday. Normal saline is going at 15/hour. GENERAL:  The patient is awake and alert.  He is not in any distress. He is slightly tachypneic. SKIN:  Warm and dry without rash, cyanosis, or edema. HEENT:  PERRLA, EOMI.  Throat is clear and moist. NECK:  Supple without JVD. CHEST:  He has rales at both bases, right greater than left about a third up on the  right.  There is some scattered expiratory wheezing as well. CARDIAC:  Irregular rhythm with no rub or gallop.  There is a 2/6 systolic ejection murmur. ABDOMEN:  Moderately obese, nontender, and nondistended.  No ascites. No masses. GU:  Normal male genitalia. EXTREMITIES:  No joint effusion or deformity.  No edema in the lower extremities.  Trace edema in the upper extremities. NEUROLOGIC:  Moves all 4 extremities equally well.  No gross cranial nerve deficits.  LABORATORY DATA:  Sodium 130, potassium 4.8, bicarb 19, BUN 64, and creatinine 4.35.  Hemoglobin 11, hematocrit 32, white blood count 20,000, and platelets 168.  BNP 477.  INR is 1.92.   Other chest x-ray from 2 days ago on June 16, 2010 showed mild cardiomegaly and temporary pacer wire in the right ventricle.  There have been no films shot since then.  Heart size was normal.  IMPRESSION: 1. Acute kidney injury secondary to IV contrast. 2. Acute ST-elevation myocardial infarction with right coronary artery     dissection after heart catheterization procedure. 3. Chronic atrial fibrillation. 4. Dyspnea, new onset tonight.  Suspect congestive heart failure or     volume overload. 5. Leukocytosis, unclear etiology.  PLAN: 1. Check urinalysis and urine electrolytes. 2. Stop IV fluids and give IV Lasix 80 mg IV q.8 h. x2. 3. Check blood cultures for high white blood count. 4. Followup chest x-ray in the morning. 5. Continue dopamine for now at 3 mcg/kg/min. 6. Place Foley.     Sol Blazing, M.D.     RDS/MEDQ  D:  06/18/2010  T:  06/19/2010  Job:  [id number]  Electronically Signed by Roney Jaffe M.D. on 07/02/2010 03:47:45 PM

## 2010-07-04 ENCOUNTER — Encounter: Payer: Self-pay | Admitting: Interventional Cardiology

## 2010-07-12 ENCOUNTER — Encounter: Payer: Self-pay | Admitting: Internal Medicine

## 2010-07-12 ENCOUNTER — Ambulatory Visit (INDEPENDENT_AMBULATORY_CARE_PROVIDER_SITE_OTHER): Payer: MEDICARE | Admitting: Internal Medicine

## 2010-07-12 ENCOUNTER — Encounter: Payer: Self-pay | Admitting: Interventional Cardiology

## 2010-07-12 DIAGNOSIS — R0989 Other specified symptoms and signs involving the circulatory and respiratory systems: Secondary | ICD-10-CM

## 2010-07-12 DIAGNOSIS — R0609 Other forms of dyspnea: Secondary | ICD-10-CM | POA: Insufficient documentation

## 2010-07-12 DIAGNOSIS — R21 Rash and other nonspecific skin eruption: Secondary | ICD-10-CM

## 2010-07-13 NOTE — Letter (Signed)
Summary: [facility] Instructions  [facility]  [place], [place] [address]   Phone: [phone]  Fax: [phone]       Eduardo [name]    03-Dec-1930    MRN: [id number]        Procedure Day /Date:FRIDAY 02/17/2010     Arrival Time:3PM     Procedure Time:4PM     Location of Procedure:                    X  [place] (4th Floor)                        [place]   Starting 5 days prior to your procedure 9/4/2011do not eat nuts, seeds, popcorn, corn, beans, peas,  salads, or any raw vegetables.  Do not take any fiber supplements (e.g. Metamucil, Citrucel, and Benefiber).  THE DAY BEFORE YOUR PROCEDURE         DATE: 02/16/2010  DAY: THURSDAY  1.  Drink clear liquids the entire day-NO SOLID FOOD  2.  Do not drink anything colored red or purple.  Avoid juices with pulp.  No orange juice.  3.  Drink at least 64 oz. (8 glasses) of fluid/clear liquids during the day to prevent dehydration and help the prep work efficiently.  CLEAR LIQUIDS INCLUDE: Water Jello Ice Popsicles Tea (sugar ok, no milk/cream) Powdered fruit flavored drinks Coffee (sugar ok, no milk/cream) Gatorade Juice: apple, white grape, white cranberry  Lemonade Clear bullion, consomm, broth Carbonated beverages (any kind) Strained chicken noodle soup Hard Candy                             4.  In the morning, mix first dose of MoviPrep solution:    Empty 1 Pouch A and 1 Pouch B into the disposable container    Add lukewarm drinking water to the top line of the container. Mix to dissolve    Refrigerate (mixed solution should be used within 24 hrs)  5.  Begin drinking the prep at 5:00 p.m. The MoviPrep container is divided by 4 marks.   Every 15 minutes drink the solution down to the next mark (approximately 8 oz) until the full liter is complete.   6.  Follow completed prep with 16 oz of clear liquid of your choice (Nothing red or  purple).  Continue to drink clear liquids until bedtime.  7.  Before going to bed, mix second dose of MoviPrep solution:    Empty 1 Pouch A and 1 Pouch B into the disposable container    Add lukewarm drinking water to the top line of the container. Mix to dissolve    Refrigerate  THE DAY OF YOUR PROCEDURE      DATE: 02/17/2010 [id number]  Beginning at 11a.m. (5 hours before procedure):         1. Every 15 minutes, drink the solution down to the next mark (approx 8 oz) until the full liter is complete.  2. Follow completed prep with 16 oz. of clear liquid of your choice.    3. You may drink clear liquids until 2PM (2 HOURS BEFORE PROCEDURE).   MEDICATION INSTRUCTIONS  Unless otherwise instructed, you should take regular prescription medications with a small sip of water   as early as possible the morning of your procedure.     Stop taking Coumadin on  10/12/2009  (5 days before procedure).PER DR Deatra Ina  Additional medication instructions: _         OTHER INSTRUCTIONS  You will need a responsible adult at least 75 years of age to accompany you and drive you home.   This person must remain in the waiting room during your procedure.  Wear loose fitting clothing that is easily removed.  Leave jewelry and other valuables at home.  However, you may wish to bring a book to read or  an iPod/MP3 player to listen to music as you wait for your procedure to start.  Remove all body piercing jewelry and leave at home.  Total time from sign-in until discharge is approximately 2-3 hours.  You should go home directly after your procedure and rest.  You can resume normal activities the  day after your procedure.  The day of your procedure you should not:   Drive   Make legal decisions   Operate machinery   Drink alcohol   Return to work  You will receive specific instructions about eating, activities and medications before you leave.    The above instructions have been  reviewed and explained to me by   _______________________    I fully understand and can verbalize these instructions _____________________________ Date _________
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
## 2010-07-13 NOTE — Assessment & Plan Note (Signed)
Summary: 6MTH FOLLOW UP / LFW   Vital Signs:  Patient profile:   77 year old male Height:      62.25 inches Weight:      158.25 pounds BMI:     28.82 Temp:     98.1 degrees F oral Pulse rate:   60 / minute Pulse rhythm:   irregular BP sitting:   122 / 70  (left arm) Cuff size:   regular  Vitals Entered By: Sherrian Divers CMA Deborra Medina) (December 06, 2009 8:59 AM) CC: follow-up visit   History of Present Illness: Doing okay Left hand is better  Just had right knee arthroscopy good outcome---doesn't even need PT  Due for colonoscopy history of polyps ---at [facility] wants to go to  [employer] now  [employer] about ideal weight eats healthy goes to gym daily  No heart trouble no chest pain No SOB---stamina is stable  Changed to lipitor due to persistent high triglycerides still on tricor no sig myalgias  Anxiety mostly controlled some stress from wife---"she is very reactive" still sees Dr Toy Care  Allergies: 1)  ! Penicillin V Potassium (Penicillin V Potassium) 2)  ! Sulfa  Past History:  Past medical, surgical, family and social histories (including risk factors) reviewed for relevance to current acute and chronic problems.  Past Medical History: Anemia-NOS Anticoagulation therapy Anxiety----------------------------------------------------------Dr Toy Care Atrial fibrillation---------------------------------------------------Dr Tamala Julian Coronary artery disease Hyperlipidemia Hypertension COPD Allergic rhinitis Osteoarthritis Colonic polyps, hx of  Past Surgical History: Cardiac cath 1992---50% LAD and luminal irreg in RCA Appendectomy Tonsillectomy Left carpal tunnel repair Mastoid operation x 3 on right Pilonidal cyst Left knee arthroscopy Rght shoulder arthroscopy Right heel fracture repair Nasal septal repair 2/09 Stress test okay Penile implant x 2--got infection after 2nd and had to remove 5/11 Right knee arthroscopy  --Dr Durward Fortes  Family History: Reviewed  history from 04/14/2008 and no changes required. Dad died @47  MI Mom died from chemical induced cancer (leukemia?) @81  Only child CAD on dad's side No HTN, DM No prostate or colon cancer  Social History: Reviewed history from 04/14/2008 and no changes required. 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 Quit in 1980--alcoholic  Has living will. Has designated wife, then step daughter [name] have health care POA. Not sure about DNR. Would leave decisions about feeding tube to wife at this time  Review of Systems       weight is stable sleeps great scattered mild arthritic problems  Physical Exam  General:  alert and normal appearance.   Neck:  supple, no masses, no thyromegaly, no carotid bruits, and no cervical lymphadenopathy.   Lungs:  normal respiratory effort and normal breath sounds.   Heart:  normal rate, no murmur, no gallop, and irregular rhythm.   Abdomen:  soft and non-tender.   Pulses:  1+ in feet Extremities:  no edema Neurologic:  alert & oriented X3 and strength normal in all extremities.   Psych:  normally interactive, good eye contact, not anxious appearing, and not depressed appearing.     Impression & Recommendations:  Problem # 1:  HYPERTENSION (ICD-401.9) Assessment Unchanged  good control no changes needed  His updated medication list for this problem includes:    Verapamil Hcl Cr 240 Mg Cr-tabs (Verapamil hcl) .Marland Kitchen... Take one by mouth daily  BP today: 122/70 Prior BP: 120/70 (07/04/2009)  Labs Reviewed: K+: 5.2 (05/31/2009) Creat: : 1.1 (05/31/2009)  Chol: 134 (05/31/2009)   HDL: 46.60 (05/31/2009)   LDL: DEL (04/14/2008)   TG: 740.0 (05/31/2009)  Orders: TLB-Renal Function Panel (80069-RENAL) TLB-CBC Platelet - w/Differential (85025-CBCD) TLB-TSH (Thyroid Stimulating Hormone)  (84443-TSH)  Problem # 2:  ATRIAL FIBRILLATION (ICD-427.31) Assessment: Unchanged good rate control on coumadin---managed by Dr Tamala Julian  His updated medication list for this problem includes:    Coumadin 5 Mg Tabs (Warfarin sodium) ..... Use as directed    Verapamil Hcl Cr 240 Mg Cr-tabs (Verapamil hcl) .Marland Kitchen... Take one by mouth daily  Problem # 3:  OSTEOARTHRITIS (ICD-715.90) Assessment: Unchanged knee better after arthroscopy scattered pain but generally doesn't take meds  Problem # 4:  HYPERLIPIDEMIA (ICD-272.4) Assessment: Comment Only  not sure zetia needed Dr Tamala Julian manages will check labs on lipitor   His updated medication list for this problem includes:    Lipitor 20 Mg Tabs (Atorvastatin calcium) .Marland Kitchen... Take one tablet by mouth daily    Tricor 145 Mg Tabs (Fenofibrate) .Marland Kitchen... Take one by mouth daily    Zetia 10 Mg Tabs (Ezetimibe) .Marland Kitchen... Take one by mouth daily  Labs Reviewed: SGOT: 28 (05/31/2009)   SGPT: 20 (05/31/2009)   HDL:46.60 (05/31/2009), 41.70 (11/16/2008)  LDL:DEL (04/14/2008)  Chol:134 (05/31/2009), 139 (11/16/2008)  Trig:740.0 (05/31/2009), 671.0 (11/16/2008)  Orders: TLB-Lipid Panel (80061-LIPID) TLB-Hepatic/Liver Function Pnl (80076-HEPATIC) Venipuncture [id number])  Problem # 5:  COLONIC POLYPS, HX OF (ICD-V12.72) Assessment: Comment Only  due for surveillance last in April of 2006 GI records in [place] records  Orders: Gastroenterology Referral (GI)  Problem # 6:  CORONARY ARTERY DISEASE (ICD-414.00) Assessment: Unchanged quiet  His updated medication list for this problem includes:    Verapamil Hcl Cr 240 Mg Cr-tabs (Verapamil hcl) .Marland Kitchen... Take one by mouth daily  Problem # 7:  ANXIETY (ICD-300.00) Assessment: Comment Only stable Dr Toy Care manages  His updated medication list for this problem includes:    Lexapro 20 Mg Tabs (Escitalopram oxalate) .Marland Kitchen... Take 1 & 1/2 tabs  by mouth daily    Budeprion Xl 300 Mg Xr24h-tab (Bupropion hcl) .Marland Kitchen... Take  one by mouth dialy  Complete Medication List: 1)  Coumadin 5 Mg Tabs (Warfarin sodium) .... Use as directed 2)  Verapamil Hcl Cr 240 Mg Cr-tabs (Verapamil hcl) .... Take one by mouth daily 3)  Lipitor 20 Mg Tabs (Atorvastatin calcium) .... Take one tablet by mouth daily 4)  Lexapro 20 Mg Tabs (Escitalopram oxalate) .... Take 1 & 1/2 tabs  by mouth daily 5)  Tricor 145 Mg Tabs (Fenofibrate) .... Take one by mouth daily 6)  Budeprion Xl 300 Mg Xr24h-tab (Bupropion hcl) .... Take one by mouth dialy 7)  Zetia 10 Mg Tabs (Ezetimibe) .... Take one by mouth daily 8)  Citracal Plus Tabs (Multiple minerals-vitamins) .... Take 2 by mouth daily 9)  Vitamin C 500 Mg Tabs (Ascorbic acid) .... Take one by mouth daily 10)  Selenium 200 Mcg Caps (Selenium) .... Take one by mouth dialy 11)  Vitamin E 400 Unit Caps (Vitamin e) .... Take one by mouth daily 12)  B-100 Complex Tabs (Vitamins-lipotropics) .... Take one by mouth daily 13)  Co Q-10 50 Mg Caps (Coenzyme q10) .... Take one by mouth daily 14)  Glucosamine-chondroitin 750-600 Mg Tabs (Glucosamine-chondroitin) .... Take 2 by mouth daily 15)  Saw Palmetto 450 Mg Caps (Saw palmetto (serenoa repens)) .... Take one by mouth daily 16)  Potassium 99 Mg Tabs (Potassium) .... Take one by mouth daily 17)  Zinc 100 Mg Tabs (Zinc) .... Take  one by mouth daily 18)  Fexofenadine Hcl 180 Mg Tabs (Fexofenadine hcl) .Marland Kitchen.. 1 daily as needed for allergies 19)  Vitamin D 400 Unit Tabs (Cholecalciferol) .... Take 1 by mouth once daily 20)  Vitamin B-12 1000 Mcg Tabs (Cyanocobalamin) .... As directed  Patient Instructions: 1)  Please schedule a follow-up appointment in 6 months .  2)  Schedule a colonoscopy/ sigmoidoscopy to help detect colon cancer.   Current Allergies (reviewed today): ! PENICILLIN V POTASSIUM (PENICILLIN V POTASSIUM) ! SULFA

## 2010-07-13 NOTE — Letter (Signed)
Summary: Results Letter  [facility]  [place], [place] [address]   Phone: [phone]  Fax: [phone]        January 17, 2010 MRN: [id number]    [facility] [address] [place], [place]  [address]    Dear Mr. MAYTON,  It is my pleasure to have treated you recently as a new patient in my office. I appreciate your confidence and the opportunity to participate in your care.  Since I do have a busy inpatient endoscopy schedule and office schedule, my office hours vary weekly. I am, however, available for emergency calls everyday through my office. If I am not available for an urgent office appointment, another one of our gastroenterologist will be able to assist you.  My well-trained staff are prepared to help you at all times. For emergencies after office hours, a physician from our Gastroenterology section is always available through my 24 hour answering service  Once again I welcome you as a new patient and I look forward to a happy and healthy relationship            Sincerely,  Inda Castle MD  This letter has been electronically signed by your physician.  Appended Document: Results Letter letter mailed

## 2010-07-13 NOTE — Assessment & Plan Note (Signed)
Summary: consult colonoscopy/lk   History of Present Illness Visit Type: consult  Primary GI MD: Erskine Emery MD [facility] Primary Provider: [name], MD Requesting Provider: [name], MD Chief Complaint: Consult colon. Pt is on Coumadin.  Pt denies any GI complaints  History of Present Illness:   Eduardo [name] is a pleasant 77 year old white male referred  at the request of Dr. [name] for colonoscopy.  He has a history of an adenomatous polyp removed 10 years ago.  He has no GI complaints.  The patient is on Coumadin because of atrial fibrillation.   GI Review of Systems      Denies abdominal pain, acid reflux, belching, bloating, chest pain, dysphagia with liquids, dysphagia with solids, heartburn, loss of appetite, nausea, vomiting, vomiting blood, weight loss, and  weight gain.        Denies anal fissure, black tarry stools, change in bowel habit, constipation, diarrhea, diverticulosis, fecal incontinence, heme positive stool, hemorrhoids, irritable bowel syndrome, jaundice, light color stool, liver problems, rectal bleeding, and  rectal pain.    Current Medications (verified): 1)  Coumadin 5 Mg Tabs (Warfarin Sodium) .... Use As Directed 2)  Verapamil Hcl Cr 240 Mg Cr-Tabs (Verapamil Hcl) .... Take One By Mouth Daily 3)  Lipitor 20 Mg Tabs (Atorvastatin Calcium) .... Take One Tablet By Mouth Daily 4)  Lexapro 20 Mg Tabs (Escitalopram Oxalate) .... Take 1 & 1/2 Tabs  By Mouth Daily 5)  Tricor 145 Mg Tabs (Fenofibrate) .... Take One By Mouth Daily 6)  Budeprion Xl 300 Mg Xr24h-Tab (Bupropion Hcl) .... Take One By Mouth Dialy 7)  Zetia 10 Mg Tabs (Ezetimibe) .... Take One By Mouth Daily 8)  Citracal Plus  Tabs (Multiple Minerals-Vitamins) .... Take 2 By Mouth Daily 9)  Vitamin C 500 Mg Tabs (Ascorbic Acid) .... Take One By Mouth Daily 10)  Selenium 200 Mcg Caps (Selenium) .... Take One By Mouth Dialy 11)  Vitamin E 400 Unit Caps (Vitamin E) .... Take One By Mouth Daily 12)   B-100 Complex  Tabs (Vitamins-Lipotropics) .... Take One By Mouth Daily 13)  Co Q-10 50 Mg Caps (Coenzyme Q10) .... Take One By Mouth Daily 14)  Glucosamine-Chondroitin 750-600 Mg Tabs (Glucosamine-Chondroitin) .... Take 2 By Mouth Daily 15)  Saw Palmetto 450 Mg Caps (Saw Palmetto (Serenoa Repens)) .... Take One By Mouth Daily 16)  Potassium 99 Mg Tabs (Potassium) .... Take One By Mouth Daily 17)  Zinc 100 Mg Tabs (Zinc) .... Take One By Mouth Daily 18)  Fexofenadine Hcl 180 Mg Tabs (Fexofenadine Hcl) .Marland Kitchen.. 1 Daily As Needed For Allergies 19)  Vitamin D 400 Unit Tabs (Cholecalciferol) .... Take 1 By Mouth Once Daily 20)  Vitamin B-12 1000 Mcg Tabs (Cyanocobalamin) .... As Directed  Allergies (verified): 1)  ! Penicillin V Potassium (Penicillin V Potassium) 2)  ! Sulfa  Past History:  Past Medical History: Reviewed history from 01/12/2010 and no changes required. Anemia-NOS Anticoagulation therapy Anxiety----------------------------------------------------------Dr Toy Care Atrial fibrillation---------------------------------------------------Dr Tamala Julian Coronary artery disease Hyperlipidemia Hypertension COPD Allergic rhinitis Osteoarthritis Colonic polyps, hx of Hyperplastic polyp  2001 Adenomatous Polyp 2001  Past Surgical History: Reviewed history from 12/06/2009 and no changes required. 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 Has designated wife, then step daughter [name] have health care POA. Not sure about DNR. Would leave decisions about feeding tube to wife at this time Daily Caffeine Use: 4 daily   Review of Systems       The patient complains of allergy/sinus, anemia, anxiety-new, arthritis/joint pain, back pain, fatigue, and shortness of breath.  The patient denies blood in urine, breast changes/lumps, change in vision, confusion, cough, coughing up blood, depression-new, fainting, fever, headaches-new, hearing problems, heart murmur, heart rhythm changes, itching, muscle pains/cramps, night sweats, nosebleeds, skin rash, sleeping problems, sore throat, swelling of feet/legs, swollen lymph glands, thirst - excessive, urination - excessive, urination changes/pain, urine leakage, vision changes, and voice change.         All other systems were reviewed and were negative   Vital Signs:  Patient profile:   77 year old male Height:      62.25 inches Weight:      158 pounds BMI:     28.77 BSA:     1.74 Pulse rate:   64 / minute Pulse rhythm:   irregular BP sitting:   132 / 72  (left arm) Cuff size:   regular  Vitals Entered By: Hope Pigeon CMA (January 17, 2010 8:32 AM)  Physical Exam  Additional Exam:  On physical exam he is a well-developed well-nourished male  skin: anicteric HEENT: normocephalic; PEERLA; no nasal or pharyngeal abnormalities neck: supple nodes: no cervical lymphadenopathy chest: clear to ausculatation and percussion heart: no murmurs, gallops, or rubs abd: soft, nontender; BS normoactive; no abdominal masses, tenderness, organomegaly rectal: deferred ext: no cynanosis, clubbing, edema skeletal: no deformities neuro: oriented x  3; no focal abnormalities    Impression & Recommendations:  Problem # 1:  COLONIC POLYPS, HX OF (ICD-V12.72)  Plan followup colonoscopy.  Coumadin will be held in advance of the procedure.  The risks and alternatives for holding anticoagulants or antiplatelet medicines were discussed with the patient. Risks, alternatives, and complications of the procedure, including bleeding, perforation, and possible need for surgery, were explained to the patient.  Patient's questions were answered.  Orders: Colonoscopy (Colon)  Problem # 2:  COPD (ICD-496) Assessment: Comment Only  Problem # 3:  CORONARY ARTERY DISEASE (ICD-414.00) Assessment: Comment Only  Problem # 4:  ATRIAL FIBRILLATION (ICD-427.31) Assessment: Comment Only  Problem # 5:  ANTICOAGULATION THERAPY (ICD-V58.61) Assessment: Comment Only  Patient Instructions: 1)  Copy sent to : [name], MD 2)  Colonoscopy and Flexible Sigmoidoscopy brochure given.  3)  Conscious Sedation brochure given.  4)  Your Colonoscopy is scheduled for 02/17/2010 at 4pm 5)  ou can pick up your MoviPrep today from your pharmacy 6)  You have been instructed to hold your coumadin 5 days prior to your colonoscopy per Dr Deatra Ina 7)  The medication list was reviewed and reconciled.  All changed / newly prescribed medications were explained.  A complete medication list was provided to the patient / caregiver. Prescriptions: MOVIPREP 100 GM  SOLR (PEG-KCL-NACL-NASULF-NA ASC-C) As per prep instructions.  #1 x 0   Entered by:   Genella Mech  CMA (AAMA)   Authorized by:   Inda Castle MD   Signed by:   Genella Mech CMA ([place]) on 01/17/2010   Method used:   Electronically to        [facility] (retail)       [address]       [place], [place]  [address]       Ph: [phone]       Fax: [phone]   RxID:   [id number]

## 2010-07-13 NOTE — Letter (Signed)
Summary: Patient Notice-Hyperplastic Polyps  [facility]  [place], [place] [address]   Phone: [phone]  Fax: [phone]        February 22, 2010 MRN: [id number]    [facility] [address] [place], [place]  [address]    Dear Mr. FETTEROLF,  I am pleased to inform you that the colon polyp(s) removed during your recent colonoscopy was (were) found to be hyperplastic.  These types of polyps are NOT pre-cancerous. In view of your family history of colon cancer, however, I recommend followup colonoscopy in 5 years.    Should you develop new or worsening symptoms of abdominal pain, bowel habit changes or bleeding from the rectum or bowels, please schedule an evaluation with either your primary care physician or with me.  Additional information/recommendations:  __No further action with gastroenterology is needed at this time.      Please follow-up with your primary care physician for your other      healthcare needs. __Please call [phone] to schedule a return visit to review      your situation.  __Please keep your follow-up visit as already scheduled.  _x_Continue treatment plan as outlined the day of your exam.  Please call us if you are having persistent problems or have questions about your condition that have not been fully answered at this time.  Sincerely,  Inda Castle MD This letter has been electronically signed by your physician.  Appended Document: Patient Notice-Hyperplastic Polyps letter mailed

## 2010-07-13 NOTE — Op Note (Signed)
Summary: Colonoscopy    [place]  Patient:    Eduardo [name], Eduardo [name]                     MRN: [id number] Proc. Date: 11/28/99 Dictator:   Garlan Fair III, M.D. CC:         Hal T. Stoneking, M.D.                           Procedure Report  PROCEDURE:  Colonoscopy and polypectomy.  REFERRING PHYSICIAN:  Hal T. Stoneking, M.D.  INDICATIONS:  [name] is a 77 year old male.  He underwent his complete physical examination performed by Dr. Lajean Manes August 14, 1999. He submitted stool cards for hemoccult testing September 12, 1999.  One of six stool cards was positive for blood.  Mr. [name] mother developed colon cancer at approximately age 61.  Mr. [name] takes Coumadin chronically to prevent stroke secondary to chronic atrial fibrillation.  Mr. [name] viewed our colonoscopy education film and I discussed with him the complications associated with colonoscopy and polypectomy including intestinal bleeding and intestinal perforation.  Mr. [name] __________  MEDICATION ALLERGIES:  Sulfa.  CHRONIC MEDICATIONS:  Coumadin, Verapamil and digoxin.  PAST MEDICAL HISTORY:  Three remote mastoid operations, chronic atrial fibrillation, hypercholesterolemia and coronary artery disease.  ENDOSCOPIST:  [name], M.D.  PREMEDICATION:  Demerol 30 mg, Versed 5 mg.  ENDOSCOPE:  Olympus pediatric colonoscopy.  DESCRIPTION OF PROCEDURE:  After obtaining informed consent the patient was placed in the left lateral decubitus position.  I administered intravenous Demerol and intravenous Versed to achieve conscious sedation for the procedure.  The patients blood pressure, oxygen saturation and cardiac rhythm were monitored throughout the procedure and documented in the medical record.  Anal inspection was normal.  Digital rectal exam revealed a non-nodular prostate.  The Olympus pediatric videocolonoscope was introduced into the rectum and  under direct vision, advanced to the cecum and identified a normal-appearing ileocecal valve.  Colonic preparation for the exam today was excellent.  Rectum:  Large nonbleeding internal hemorrhoids.  A 1 mm sessile polyp was removed with the cold biopsy forceps from the distal rectum.  Sigmoid colon and descending colon normal.  Splenic flexure normal.  Transverse colon normal.  Hepatic flexure normal.  Ascending colon normal.  Cecum and ileocecal valve:  From the proximal cecum a 1 mm sessile polyp was removed with the cold biopsy forceps.  ASSESSMENT: 1.  Large nonbleeding internal hemorrhoids. 2.  From the proximal cecum a 1 mm sessile polyp was removed with the cold biopsy forceps; from the distal rectum a 1 mm sessile polyp was removed with the cold biopsy forceps.  Both polyps were submitted in one bottle for pathological evaluation.  RECOMMENDATIONS:  Repeat colonoscopy in five years. DD:  11/28/99 TD:  11/30/99 Job: [id number]

## 2010-07-13 NOTE — Consult Note (Signed)
Summary: [facility] By: Edmonia James 08/16/2009 14:23:30  _____________________________________________________________________  External Attachment:    Type:   Image     Comment:   External Document  Appended Document: [place] left hand pain trying conservative Rx

## 2010-07-13 NOTE — Progress Notes (Signed)
Summary: prep ?'s  Phone Note Call from Patient Call back at Home Phone [phone] Call back at [phone]   Caller: wife, Chesley Noon Call For: Dr. Deatra Ina Reason for Call: Talk to Nurse Summary of Call: prep ?'s Initial call taken by: Lucien Mons,  February 16, 2010 9:42 AM  Follow-up for Phone Call        Spoke with pts. wife.  She wanted to know when the clear liquid diet started.  Explained to her that for his procedure tomorrow he needed to be on clear liquids today and tomorrow.  She said that he had a breakfast bar this morning.  Told her that he could not have any other solid foods today and to make sure he drinks plenty of clear liquids.  She verbalized understanding. Follow-up by: Alphonsa Gin RN,  February 16, 2010 10:09 AM

## 2010-07-13 NOTE — Assessment & Plan Note (Signed)
Summary: ROA FOR 6 MONTH FOLLOW-UP/JRR   Vital Signs:  Patient profile:   77 year old male Weight:      161 pounds Temp:     97.6 degrees F oral Pulse rate:   64 / minute Pulse rhythm:   regular BP sitting:   130 / 70  (left arm) Cuff size:   large  Vitals Entered By: Edwin Dada CMA Deborra Medina) (June 06, 2010 9:13 AM) CC: 18mth follow-up   History of Present Illness: Recent visit iwth Dr Tamala Julian Had noticed increased DOE --couldn't go more than 3 minutes on elliptical (did 20-30 minutes in past) Had posterior thigh pain also Also has DOE going up stairs that is new Stress test done and abnormal Now scheduled for cath  Ongoing rash on lateral left ankle and some on right ankle very itchy goes back at least 6 months OTC cortisone cream no help--though it did take away itch some  Anxiety has been controlled still sees Dr Toy Care yearly No new marital issues--things are "very good"  No palpitations  Allergies: 1)  ! Penicillin V Potassium (Penicillin V Potassium) 2)  ! Sulfa  Past History:  Past medical, surgical, family and social histories (including risk factors) reviewed for relevance to current acute and chronic problems.  Past Medical History: Reviewed history from 01/12/2010 and no changes required. Anemia-NOS Anticoagulation therapy Anxiety----------------------------------------------------------Dr Toy Care Atrial fibrillation---------------------------------------------------Dr Tamala Julian Coronary artery disease Hyperlipidemia Hypertension COPD Allergic rhinitis Osteoarthritis Colonic polyps, hx of Hyperplastic polyp  2001 Adenomatous Polyp 2001  Past Surgical History: Reviewed history from 12/06/2009 and no changes required. Cardiac cath 1992---50% LAD and luminal irreg in RCA Appendectomy Tonsillectomy Left carpal tunnel repair Mastoid operation x 3 on right Pilonidal cyst Left knee arthroscopy Rght shoulder arthroscopy Right heel fracture  repair Nasal septal repair 2/09 Stress test okay Penile implant x 2--got infection after 2nd and had to remove 5/11 Right knee arthroscopy  --Dr Durward Fortes  Family History: Reviewed history from 01/17/2010 and no changes required. Dad died @47  MI Mom died from chemical induced cancer (leukemia?) @81  Only child CAD on dad's side No HTN, DM No prostate cancer Family History of Colon Cancer:Mother   Social History: Reviewed history from 01/17/2010 and no changes required. 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 Has designated wife, then step daughter [name] have health care POA. Not sure about DNR. Would leave decisions about feeding tube to wife at this time Daily Caffeine Use: 4 daily   Review of Systems       sleeps well appetite is fine  weight is fairly stable--plans to start with Weight Watchers  Physical Exam  General:  alert and normal appearance.   Neck:  supple, no masses, no thyromegaly, and no cervical lymphadenopathy.   Lungs:  normal respiratory effort, no intercostal retractions, no accessory muscle use, normal breath sounds, no crackles, and no wheezes.   Heart:  normal rate, no murmur, no gallop, and irregular rhythm.   Msk:  no joint tenderness and no joint swelling.   Extremities:  no edema Skin:  diffuse slightly rash and partially lichenfied rash along distal calves and ankles Psych:  normally interactive, good eye contact, not anxious appearing, and not depressed appearing.     Impression & Recommendations:  Problem # 1:  RASH AND OTHER NONSPECIFIC SKIN ERUPTION (ICD-782.1) Assessment New  not clear cut  but looks like neuroderm not consistent with tinea  will try Rx steroid cream derm eval if persists  His updated medication list for this  problem includes:    Triamcinolone Acetonide 0.1 % Crea (Triamcinolone acetonide) .Marland Kitchen... Apply to rash three times a day till clear  Problem # 2:  CORONARY ARTERY DISEASE (ICD-414.00) Assessment: Deteriorated has increased DOE and abnormal stress test Dr Tamala Julian has cath planned  His updated medication list for this problem includes:    Verapamil Hcl Cr 240 Mg Cr-tabs (Verapamil hcl) .Marland Kitchen... Take one by mouth daily  Problem # 3:  ATRIAL FIBRILLATION (ICD-427.31) Assessment: Unchanged good rate control  Dr Tamala Julian monitors protimes  His updated medication list for this problem includes:    Coumadin 5 Mg Tabs (Warfarin sodium) ..... Use as directed    Verapamil Hcl Cr 240 Mg Cr-tabs (Verapamil hcl) .Marland Kitchen... Take one by mouth daily  Problem # 4:  HYPERTENSION (ICD-401.9) Assessment: Unchanged good control may need to consider beta blocker depending on cath results  His updated medication list for this problem includes:    Verapamil Hcl Cr 240 Mg Cr-tabs (Verapamil hcl) .Marland Kitchen... Take one by mouth daily  BP today: 130/70 Prior BP: 132/72 (01/17/2010)  Labs Reviewed: K+: 5.0 (12/06/2009) Creat: : 1.0 (12/06/2009)   Chol: 136 (12/06/2009)   HDL: 45.70 (12/06/2009)   LDL: DEL (04/14/2008)   TG: 714.0 (12/06/2009)  Problem # 5:  HYPERLIPIDEMIA (ICD-272.4) Assessment: Unchanged reasonable control on meds  His updated medication list for this problem includes:    Lipitor 20 Mg Tabs (Atorvastatin calcium) .Marland Kitchen... Take one tablet by mouth daily    Tricor 145 Mg Tabs (Fenofibrate) .Marland Kitchen... Take one by mouth daily    Zetia 10 Mg Tabs (Ezetimibe) .Marland Kitchen... Take one by mouth daily  Labs Reviewed: SGOT: 26 (12/06/2009)   SGPT: 20 (12/06/2009)   HDL:45.70 (12/06/2009), 46.60 (05/31/2009)  LDL:DEL (04/14/2008)  Chol:136 (12/06/2009), 134 (05/31/2009)  Trig:714.0 (12/06/2009), 740.0 (05/31/2009)  Problem # 6:  ANXIETY (ICD-300.00) Assessment: Unchanged controlled wtih meds  His updated medication list for  this problem includes:    Lexapro 20 Mg Tabs (Escitalopram oxalate) .Marland Kitchen... Take 1 & 1/2 tabs  by mouth daily    Budeprion Xl 300 Mg Xr24h-tab (Bupropion hcl) .Marland Kitchen... Take one by mouth dialy  Complete Medication List: 1)  Coumadin 5 Mg Tabs (Warfarin sodium) .... Use as directed 2)  Verapamil Hcl Cr 240 Mg Cr-tabs (Verapamil hcl) .... Take one by mouth daily 3)  Lipitor 20 Mg Tabs (Atorvastatin calcium) .... Take one tablet by mouth daily 4)  Lexapro 20 Mg Tabs (Escitalopram oxalate) .... Take 1 & 1/2 tabs  by mouth daily 5)  Tricor 145 Mg Tabs (Fenofibrate) .... Take one by mouth daily 6)  Budeprion Xl 300 Mg Xr24h-tab (Bupropion hcl) .... Take one by mouth dialy 7)  Zetia 10 Mg Tabs (Ezetimibe) .... Take one by mouth daily 8)  Citracal Plus Tabs (Multiple minerals-vitamins) .... Take 2 by mouth daily 9)  Co Q-10 50 Mg Caps (Coenzyme q10) .... Take one by mouth daily 10)  Glucosamine-chondroitin 750-600 Mg Tabs (Glucosamine-chondroitin) .... Take 2 by mouth daily 11)  Saw Palmetto 450 Mg Caps (Saw palmetto (serenoa repens)) .... Take one by mouth daily 12)  Fexofenadine Hcl 180 Mg Tabs (Fexofenadine hcl) .Marland Kitchen.. 1 daily as needed for allergies 13)  Triamcinolone Acetonide 0.1 % Crea (Triamcinolone acetonide) .... Apply to rash three times a day till clear  Patient Instructions: 1)  Please schedule a follow-up  appointment in 6 months .  Prescriptions: TRIAMCINOLONE ACETONIDE 0.1 % CREA (TRIAMCINOLONE ACETONIDE) apply to rash three times a day till clear  #60gm x 1   Entered and Authorized by:   Claris Gower MD   Signed by:   Claris Gower MD on 06/06/2010   Method used:   Electronically to        [facility] (retail)       [address]       [place], [place]  [address]       Ph: [phone]       Fax: [phone]   RxIDVU:[id number]    Orders Added: 1)  Est. Patient Level IV [id number]    Current Allergies (reviewed today): ! PENICILLIN V POTASSIUM (PENICILLIN V  POTASSIUM) ! SULFA

## 2010-07-13 NOTE — Procedures (Signed)
Summary: Colonoscopy  Patient: Eduardo [name] Note: All result statuses are Final unless otherwise noted.  Tests: (1) Colonoscopy (COL)   COL Colonoscopy           DONE (C)     [facility].     [place], [place]  [address]           COLONOSCOPY PROCEDURE REPORT           PATIENT:  Eduardo [name], Eduardo [name]  MR#:  [id number]     BIRTHDATE:  April 02, 1931, 78 yrs. old  GENDER:  male           ENDOSCOPIST:  Sandy Salaam. Deatra Ina, MD     Referred by:  [name], M.D.           PROCEDURE DATE:  02/17/2010     PROCEDURE:  Colonoscopy with snare polypectomy     ASA CLASS:  Class II     INDICATIONS:  1) screening  2) history of pre-cancerous     (adenomatous) colon polyps Last colo greater than 10 years ago     3)FH colon Ca - mother           MEDICATIONS:   Fentanyl 50 mcg IV, Versed 5 mg IV           DESCRIPTION OF PROCEDURE:   After the risks benefits and     alternatives of the procedure were thoroughly explained, informed     consent was obtained.  Digital rectal exam was performed and     revealed no abnormalities.   The LB CF-[id number] endoscope     was introduced through the anus and advanced to the cecum, which     was identified by both the appendix and ileocecal valve, without     limitations.  The quality of the prep was excellent, using     MoviPrep.  The instrument was then slowly withdrawn as the colon     was fully examined.     <<PROCEDUREIMAGES>>           FINDINGS:  A diminutive polyp was found in the rectum. It was 2 mm     in size. Polyp was snared, then cauterized with monopolar cautery.     Retrieval was successful (see image16). snare polyp  This was     otherwise a normal examination of the colon (see image1, image3,     image5, image6, image8, image9, image10, image13, image14, and     image17).   Retroflexed views in the rectum revealed no     abnormalities.    The time to cecum =  3.25  minutes. The scope     was then withdrawn  (time =  8.25  min) from the patient and the     procedure completed.           COMPLICATIONS:  None           ENDOSCOPIC IMPRESSION:     1) 2 mm diminutive polyp in the rectum     2) Otherwise normal examination     RECOMMENDATIONS:     1) followup colonoscopy 5 years in view of your family history     2) resume coumadin in am           REPEAT EXAM:  No           ______________________________     Sandy Salaam. Deatra Ina, MD  CC:           n.     REVISED:  02/17/2010 04:02 PM     eSIGNED:   Sandy Salaam. Kaplan at 02/17/2010 04:02 PM           Page 2 of 3   Derrol, Sharman Harrison, [id number]  Note: An exclamation mark (!) indicates a result that was not dispersed into the flowsheet. Document Creation Date: 02/17/2010 4:03 PM _______________________________________________________________________  (1) Order result status: Final Collection or observation date-time: 02/17/2010 15:53 Requested date-time:  Receipt date-time:  Reported date-time:  Referring Physician:   Ordering Physician: Erskine Emery [phone]) Specimen Source:  Source: Tawanna Cooler Order Number: [id number] Lab site:   Appended Document: Colonoscopy     Procedures Next Due Date:    Colonoscopy: 02/2015

## 2010-07-13 NOTE — Progress Notes (Signed)
Summary: hand is no better  Phone Note Call from Patient Call back at Home Phone [phone]   Caller: Patient Call For: Claris Gower MD Summary of Call: Hurt hand in october, saw Dr. [name], his hand is still not any better. Would like a referral to orthopaedic dr. Please advise.  Initial call taken by: Lacretia Nicks,  July 29, 2009 9:05 AM  Follow-up for Phone Call        okay to make referral to hand surgeon Follow-up by: Claris Gower MD,  July 29, 2009 10:48 AM  Additional Follow-up for Phone Call Additional follow up Details #1::        Spoke to the patients wife gave them [facility] phone number. They want to call to make the appt and will call us back after it is made.  Additional Follow-up by: Haynes Bast,  July 29, 2009 4:39 PM

## 2010-07-19 NOTE — Assessment & Plan Note (Signed)
Summary: S.O.B.,?RASH/CLE   Vital Signs:  Patient profile:   77 year old male Weight:      160 pounds O2 Sat:      98 % on Room air Pulse rate:   80 / minute Pulse rhythm:   regular Resp:     16 per minute BP sitting:   112 / 53  (left arm) Cuff size:   large  Vitals Entered By: Edwin Dada CMA Deborra Medina) (July 12, 2010 12:37 PM)  O2 Flow:  Room air CC: RASH, WHEEZING, SOB   History of Present Illness: Had MI due to RCA ostial dissection after cath Has already had cardiology follow up since then had echo--was fine  Having some wheezing at night Has affected his sleep since his hospital stay started months ago SOB also--that is why he had the diagnostic cath  Has some sensation across upper chest with exertion---like walking across parking lot to [facility]  Rash on forearms since in hospital some on legs also some itching Off verapamil now On pantoprazoke for gastroprotection for 1 month  No stomach trouble No heartburn     Allergies: 1)  ! Penicillin V Potassium (Penicillin V Potassium) 2)  ! Sulfa  Past History:  Past medical, surgical, family and social histories (including risk factors) reviewed for relevance to current acute and chronic problems.  Past Medical History: Reviewed history from 01/12/2010 and no changes required. Anemia-NOS Anticoagulation therapy Anxiety----------------------------------------------------------Dr Toy Care Atrial fibrillation---------------------------------------------------Dr Tamala Julian Coronary artery disease Hyperlipidemia Hypertension COPD Allergic rhinitis Osteoarthritis Colonic polyps, hx of Hyperplastic polyp  2001 Adenomatous Polyp 2001  Past Surgical History: Cardiac cath 1992---50% LAD and luminal irreg in RCA Appendectomy Tonsillectomy Left carpal tunnel repair Mastoid operation x 3 on right Pilonidal cyst Left knee arthroscopy Rght shoulder arthroscopy Right heel fracture repair Nasal septal  repair 2/09 Stress test okay Penile implant x 2--got infection after 2nd and had to remove 5/11 Right knee arthroscopy  --Dr Durward Fortes 1/12  Post cath MI from damage  Family History: Reviewed history from 01/17/2010 and no changes required. Dad died @47  MI Mom died from chemical induced cancer (leukemia?) @81  Only child CAD on dad's side No HTN, DM No prostate cancer Family History of Colon Cancer:Mother   Social History: Reviewed history from 01/17/2010 and no changes required. 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 Has designated wife, then step daughter [name] have health care POA. Not sure about DNR. Would leave decisions about feeding tube to wife at this time Daily Caffeine Use: 4 daily   Review of Systems       appetite okay weight is stable Now going to [facility]  Physical Exam  General:  alert.  NAD Mouth:  no erythema and no exudates.   Neck:  supple, no masses, no thyromegaly, and no cervical lymphadenopathy.   Lungs:  normal respiratory effort, no intercostal retractions, no accessory muscle use, normal breath sounds, no crackles, and no wheezes.   Heart:  normal rate, regular rhythm, no murmur, and no gallop.   Extremities:  no sig edema Psych:  normally interactive, good eye contact, not anxious appearing, and not depressed appearing.     Impression & Recommendations:  Problem # 1:  DYSPNEA/SHORTNESS OF BREATH (ICD-786.09) Assessment New  some wheezing esp at night Spirometry shows severe obstructin with FEV1/FVC  ~50% no prior symptoms and  hasn't smoked in more than 20 years  ??some resp infection as trigger  P: trial prednisone for 10 days    reeval in 2 weeks if symptoms aren't better  Orders: Spirometry w/Graph (94010)  Problem #  2:  RASH AND OTHER NONSPECIFIC SKIN ERUPTION (ICD-782.1) Assessment: Deteriorated rash may be reaction to med pantoprazole only new one---will stop  Complete Medication List: 1)  Coumadin 5 Mg Tabs (Warfarin sodium) .... Use as directed 2)  Verapamil Hcl Cr 240 Mg Cr-tabs (Verapamil hcl) .... Take one by mouth daily 3)  Lipitor 20 Mg Tabs (Atorvastatin calcium) .... Take one tablet by mouth daily 4)  Lexapro 20 Mg Tabs (Escitalopram oxalate) .... Take 1 & 1/2 tabs  by mouth daily 5)  Tricor 145 Mg Tabs (Fenofibrate) .... Take one by mouth daily 6)  Budeprion Xl 300 Mg Xr24h-tab (Bupropion hcl) .... Take one by mouth dialy 7)  Zetia 10 Mg Tabs (Ezetimibe) .... Take one by mouth daily 8)  Citracal Plus Tabs (Multiple minerals-vitamins) .... Take 2 by mouth daily 9)  Co Q-10 50 Mg Caps (Coenzyme q10) .... Take one by mouth daily 10)  Glucosamine-chondroitin 750-600 Mg Tabs (Glucosamine-chondroitin) .... Take 2 by mouth daily 11)  Saw Palmetto 450 Mg Caps (Saw palmetto (serenoa repens)) .... Take one by mouth daily 12)  Fexofenadine Hcl 180 Mg Tabs (Fexofenadine hcl) .Marland Kitchen.. 1 daily as needed for allergies 13)  Triamcinolone Acetonide 0.1 % Crea (Triamcinolone acetonide) .... Apply to rash three times a day till clear 14)  Prednisone 20 Mg Tabs (Prednisone) .... 2 tabs daily for 5 days, then 1 tab daily for 5 days for wheezing  Patient Instructions: 1)  Please call for reevaluation if breathing is still bad in 2-3 weeks 2)  Otherwise keep regular appt Prescriptions: PREDNISONE 20 MG TABS (PREDNISONE) 2 tabs daily for 5 days, then 1 tab daily for 5 days for wheezing  #15 x 0   Entered and Authorized by:   Claris Gower MD   Signed by:   Claris Gower MD on 07/12/2010   Method used:   Electronically to        [facility] (retail)       [address]       [place], [place]  [address]       Ph: [phone]       Fax: [phone]   [id number]    Orders Added: 1)   Spirometry w/Graph [94010] 2)  Est. Patient Level IV [id number]    Current Allergies (reviewed today): ! PENICILLIN V POTASSIUM (PENICILLIN V POTASSIUM) ! SULFA

## 2010-07-20 NOTE — Discharge Summary (Signed)
Eduardo [name], Eduardo [name]            ACCOUNT NO.:  [id number]  MEDICAL RECORD NO.:  [id number]          PATIENT TYPE:  INP  LOCATION:  2034                         FACILITY:  [facility]  PHYSICIAN:  Belva Crome, M.D.   DATE OF BIRTH:  [birth year]  DATE OF ADMISSION:  06/15/2010 DATE OF DISCHARGE:  06/22/2010                              DISCHARGE SUMMARY   REASON FOR ADMISSION TO THE HOSPITAL:  Coronary artery disease with a complication of outpatient diagnostic cath.  DISCHARGE DIAGNOSES: 1. 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     a.     High-grade atrio-ventricular block on June 19, 2010 requiring      temporary pacemaker. 2. Coronary atherosclerotic heart disease.     a.     80% distal RCA and at least 80% posterior descending      coronary artery ostial narrowing, likely the source of the patient's exertional dyspnea and chest discomfort prior to the      diagnostic cath. 3. Acute diastolic heart failure secondary to acute inferior     myocardial infarction and coronary atherosclerosis. 4. Acute renal failure secondary to contrast nephropathy and     hypotension, resolved at time of discharge. 5. Gastrointestinal bleeding/heme-positive stools on June 19, 2010. 6. Chronic atrial fibrillation.     a.     Complicated by high-grade arteriovenous block on the fourth      hospital day requiring insertion of temporary pacemaker.  High-      grade arteriovenous block was caused by the acute inferior      myocardial infarction as well as the combination of verapamil and      beta-blockers therapy. It resolved with time and med adjustment. 7. History of hypertension. 8. Leukocytosis, resolved at the time of discharge.  PROCEDURES PERFORMED: 1. Diagnostic catheterization on June 15, 2010. 2. Repeat catheterization on June 16, 2010, Dr. Larae Grooms.  CONSULTATIONS:  Nephrology on June 18, 2010.  DISCHARGE INSTRUCTIONS: 1. Enroll in phase II cardiac rehab with [facility]. 2. Medications:     a.     Aspirin 81 mg per day.     b.     Metoprolol 25 mg b.i.d.     c.     Nitroglycerin 0.4 mg sublingually p.r.n. chest pain.     d.     Pantoprazole 40 mg per day for 1 month.     e.     Allegra as needed 180 mg.     f.     Lexapro 20 mg daily.     g.     Lipitor 40 mg daily.     h.     Lovaza 1 g 4 times daily.     i.     TriCor 145 mg daily.     j.     Warfarin 5 mg tablets taken as directed.     k.     Wellbutrin 300 mg daily.  l.     Zetia 10 mg per day.  ACTIVITY:  Progress as instructed by Cardiac Rehab.  SPECIAL INSTRUCTION:  Call if recurrent anginal chest pain.  APPOINTMENTS: 1. Dr. Daneen Schick on June 30, 2010 at 1 p.m. 2. [facility] on June 26, 2010 at 11:45 a.m.  HISTORY AND PHYSICAL AND HOSPITAL COURSE:  The patient was admitted to the hospital after diagnostic catheterization was complicated by ostial RCA dissection.  The patient initially had no symptoms.  An hour and A half or so after the procedure was terminated, he developed mild ST elevation and substernal and neck discomfort.  He states that the discomfort was very similar to discomfort that he had been having intermittently for several days prior to the diagnostic procedure.  His complaint prior to admission however was mostly difficulty with exertion and limiting dyspnea.  He had also undergone a nuclear study that had some abnormalities suggesting ischemia in the anterior wall.  The patient was placed in the Coronary Care Unit and the decision to treat the patient conservatively was made because of the ostial RCA dissection that extended into the aortic root.  The concern was that further manipulation of the RCA ostium could cause perforation or the frank development of an aortic  dissection.  The patient was treated medically with IV nitroglycerin, IV fluids, and minimal analgesics.  He actually did quite well for 36 hours.  On the evening of June 16, 2010, he developed nausea, severe bradycardia, hypotension, and mild chest discomfort with associated mild ST elevation.  He was taken to the cath lab for insertion of temporary transvenous pacemaker due to high-grade AV block.  Following the cath lab, repeat coronary angiography was performed by Dr. Irish Lack.  The findings at this time were very similar to those at his diagnostic procedure 36 hours earlier.  No intervention was performed.  Over the next 2 days, the patient became progressively more ill.  He developed acute tubular necrosis secondary to contrast and hypotension. He developed abdominal distention with guaiac-positive stools. Antiplatelet therapy and anticoagulants were discontinued.  The pacemaker was taken out within 12 hours after verapamil and beta- blocker therapy were discontinued.  By the fourth hospital day, the patient began to feel better.  He had no chest discomfort after the evening that the repeat catheterization was performed.  The patient ambulated for 2-1/2 days intermittently prior to discharge. Creatinine peaked at 5 and at the time of discharge is 1.75.  Coumadin was restarted and the INR at discharge is 2.5.  Hemoglobin is stable at 12.4 and the potassium is 3.9.  Telemetry has not demonstrated any significant bradycardia.  The patient has not had nitroglycerin or angina since June 16, 2010.  The patient had significant enzyme elevation suggesting inferior infarction documented over the first 96 hours of his hospital stay.  The repeat catheterization did demonstrate collaterals from the left coronary system to the PDA.  The patient will remain on a proton pump inhibitor for 30 days.  At the time of discharge, the patient is improved and is felt to  be stable.     Belva Crome, M.D.     HWS/MEDQ  D:  06/22/2010  T:  06/23/2010  Job:  [id number]  cc:   Hal T. Stoneking, M.D. Venia Carbon, MD  Electronically Signed by Daneen Schick M.D. on 07/20/2010 04:56:38 PM

## 2010-07-24 NOTE — Procedures (Signed)
NAMEJEVION, KINNETT            ACCOUNT NO.:  [id number]  MEDICAL RECORD NO.:  [id number]          PATIENT TYPE:  INP  LOCATION:  2903                         FACILITY:  [facility]  PHYSICIAN:  Clawson OF BIRTH:  1930-06-17  DATE OF PROCEDURE:  06/19/2010 DATE OF DISCHARGE:                           CARDIAC CATHETERIZATION   PRIMARY CARDIOLOGIST:  Belva Crome, MD  PRIMARY CARE PHYSICIAN:  Hal T. [place], MD  PROCEDURES PERFORMED:  Aortogram of the ascending aorta, coronary angiogram, temporary pacemaker placement.  OPERATOR:  Jettie Booze, MD  INDICATIONS:  Bradycardia, hypotension, EKG changes.  PROCEDURE NARRATIVE:  Risks and benefits of cardiac catheterization were explained to the patient.  Informed consent was obtained.  He was brought to the cath lab.  He was prepped and draped in the usual sterile fashion.  His right groin was infiltrated with 1% lidocaine.  A 6-French sheath was placed into the right femoral vein using modified Seldinger technique.  A balloon-tipped temporary pacemaker wire was advanced to the right ventricle and a good position was found for capture.  The output was dropped to 0.5 mA.  The pacemaker output was then sent to 1 mA and the backup rate was placed at 50.  It was noted several times that capture was suboptimal.  Temporary pacemaker was repositioned several times to try and achieve better capture.  A 6-French sheath was placed into the right femoral artery using modified Seldinger technique. Left coronary artery angiography was performed using a JL-4 pigtail catheter.  The catheter was advanced to the vessel ostium under fluoroscopic guidance.  Digital angiography was performed in multiple projections using hand injection of contrast.  Right coronary artery was then performed using a JR-4 guide.  A single picture was obtained with a very small injection of contrast.  Prior to the coronary angiogram, pigtail had  been advanced to the ascending aorta and a power injection of contrast performed in the LAO projection.  The sheath was removed using manual compression.  The temporary pacemaker was sewn in along with the venous sheath.  FINDINGS:  The left main was widely patent.  Left circumflex is medium-sized vessel and widely patent.  OM-1 was medium-sized and patent.  The distal circumflex is medium-sized and patent.  Left anterior descending had moderate atherosclerosis proximally.  First diagonal is medium-sized and patent.  There are left-to-right collaterals.  RCA showed dissection of the proximal vessel, which extended into the ostium of the right coronary artery and into the right coronary cusp.  Aortogram showed no staining; however, during the selective right coronary angiogram shot, there was some staining noted, which was improved from the previous pictures from the day before.  IMPRESSION: 1. Dissection of the right coronary artery extending into the right     coronary cusp.  There is TIMI 2 flow in this vessel. 2. Left-to-right collaterals feeding the PDA distribution. 3. Aortic dissection appears improved.  RECOMMENDATIONS: 1. Intervention versus conservative therapy was considered, I decided     on conservative therapy because of the risks of further catheter     manipulation extending the dissection as well as further injection  of contrast worsening the dissection.  In addition, his enzymes     were fairly high earlier today.  He is not having any angina.  I do     not think there would be much to salvage in terms of his right     coronary artery territory.  In addition, attempted PCI will be     somewhat risky in terms of the aorta. 2. Hold rate-slowing drugs.  Hopefully he will need a temporary pacer     for too long.  I think his bradycardia is likely related to the     inferior infarction and hypotension is probably related to RV     involvement of the  infarction. 3. The patient will be monitored in the CCU.     Jettie Booze, MD     JSV/MEDQ  D:  06/19/2010  T:  06/20/2010  Job:  [id number]  Electronically Signed by Larae Grooms MD on 07/24/2010 09:05:44 AM

## 2010-08-01 ENCOUNTER — Encounter: Payer: Self-pay | Admitting: Internal Medicine

## 2010-08-09 ENCOUNTER — Telehealth: Payer: Self-pay | Admitting: Internal Medicine

## 2010-08-10 ENCOUNTER — Encounter: Payer: Self-pay | Admitting: Interventional Cardiology

## 2010-08-11 ENCOUNTER — Encounter: Payer: Self-pay | Admitting: Internal Medicine

## 2010-08-17 NOTE — Progress Notes (Signed)
Summary: wants referral to pulmonologist  Phone Note Call from Patient Call back at Home Phone [phone]   Caller: Patient Call For: Eduardo Gower MD Summary of Call: Pt states his breathing has not gotten any better and he is requesting a referral to [facility].              Marty Heck CMA, AAMA  August 09, 2010 12:03 PM   Follow-up for Phone Call        referral done Follow-up by: Eduardo Gower MD,  August 09, 2010 2:38 PM

## 2010-08-22 NOTE — Letter (Signed)
Summary: [facility]   [facility]   Imported By: Jamelle Haring 08/14/2010 11:02:07  _____________________________________________________________________  External Attachment:    Type:   Image     Comment:   External Document  Appended Document: [facility]  normal ENT exam

## 2010-08-25 ENCOUNTER — Encounter: Payer: Self-pay | Admitting: Internal Medicine

## 2010-08-25 ENCOUNTER — Ambulatory Visit (INDEPENDENT_AMBULATORY_CARE_PROVIDER_SITE_OTHER): Payer: MEDICARE | Admitting: Internal Medicine

## 2010-08-25 DIAGNOSIS — R0989 Other specified symptoms and signs involving the circulatory and respiratory systems: Secondary | ICD-10-CM

## 2010-08-28 ENCOUNTER — Institutional Professional Consult (permissible substitution) (INDEPENDENT_AMBULATORY_CARE_PROVIDER_SITE_OTHER): Payer: MEDICARE | Admitting: Emergency Medicine

## 2010-08-28 ENCOUNTER — Encounter: Payer: Self-pay | Admitting: Emergency Medicine

## 2010-08-28 DIAGNOSIS — R0609 Other forms of dyspnea: Secondary | ICD-10-CM

## 2010-08-29 NOTE — Letter (Signed)
Summary: [place] By: Jamelle Haring 08/23/2010 08:12:31  _____________________________________________________________________  External Attachment:    Type:   Image     Comment:   External Document

## 2010-08-29 NOTE — Assessment & Plan Note (Signed)
Summary: discuss pulminary referral, has sob whith exercise   Vital Signs:  Patient profile:   77 year old male Weight:      152 pounds O2 Sat:      95 % on Room air Pulse rate:   88 / minute Pulse rhythm:   regular BP sitting:   114 / 65  (left arm) Cuff size:   large  Vitals Entered By: Edwin Dada CMA Deborra Medina) (August 25, 2010 11:44 AM)  O2 Flow:  Room air CC: discuss referral and SOB   History of Present Illness: He didn't feel prednisone did much Wife felt it helped---less wheezing at night  Has been going to [facility] Does treadmill and crosstraining Has noted slight improvement over time While doing walking in hall-- got SOB after 3.5 minutes Did 15 minutes on treadmill at 2.5MPH without dyspnea though  Does have appt with Dr Lamonte Sakai next week  Has  been trying accupuncture plans to try some massage as well  Allergies: 1)  ! Penicillin V Potassium (Penicillin V Potassium) 2)  ! Sulfa  Past History:  Past medical, surgical, family and social histories (including risk factors) reviewed for relevance to current acute and chronic problems.  Past Medical History: Reviewed history from 01/12/2010 and no changes required. Anemia-NOS Anticoagulation therapy Anxiety----------------------------------------------------------Dr Toy Care Atrial fibrillation---------------------------------------------------Dr Tamala Julian Coronary artery disease Hyperlipidemia Hypertension COPD Allergic rhinitis Osteoarthritis Colonic polyps, hx of Hyperplastic polyp  2001 Adenomatous Polyp 2001  Past Surgical History: Reviewed history from 07/12/2010 and no changes required. Cardiac cath 1992---50% LAD and luminal irreg in RCA Appendectomy Tonsillectomy Left carpal tunnel repair Mastoid operation x 3 on right Pilonidal cyst Left knee arthroscopy Rght shoulder arthroscopy Right heel fracture repair Nasal septal repair 2/09 Stress test okay Penile implant x 2--got infection after  2nd and had to remove 5/11 Right knee arthroscopy  --Dr Durward Fortes 1/12  Post cath MI from damage  Family History: Reviewed history from 01/17/2010 and no changes required. Dad died @47  MI Mom died from chemical induced cancer (leukemia?) @81  Only child CAD on dad's side No HTN, DM No prostate cancer Family History of Colon Cancer:Mother   Social History: Reviewed history from 01/17/2010 and no changes required. 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 Has designated wife, then step daughter [name] have health care POA. Not sure about DNR. Would leave decisions about feeding tube to wife at this time Daily Caffeine Use: 4 daily    Impression & Recommendations:  Problem # 1:  DYSPNEA/SHORTNESS OF BREATH (ICD-786.09) Assessment Comment Only  COunselled more than half of 25 minute visit he has been anxious since cardiac event less activity though improving with [facility]  Discussed that his DOE is multifactorial He can continue increasing fitness efforts regardless The question is whether Rx for lungs will help. May be worth trial but I doubt Rx will lengthen his life he will review this with Dr Lamonte Sakai  His updated medication list for this problem includes:    Metoprolol Tartrate 25 Mg Tabs (Metoprolol tartrate) .Marland Kitchen... Take 1 by mouth two times a day  Complete Medication List: 1)  Metoprolol Tartrate 25 Mg Tabs (Metoprolol tartrate) .... Take 1 by mouth two times a day 2)  Coumadin 5 Mg Tabs (Warfarin sodium) .... Use as directed 3)  Verapamil Hcl Cr 240 Mg Cr-tabs (Verapamil hcl) .... Take one by mouth  daily 4)  Lipitor 20 Mg Tabs (Atorvastatin calcium) .... Take one tablet by mouth daily 5)  Lexapro 20 Mg Tabs (Escitalopram oxalate) .... Take 1 tabs  by mouth daily 6)   Tricor 145 Mg Tabs (Fenofibrate) .... Take one by mouth daily 7)  Budeprion Xl 300 Mg Xr24h-tab (Bupropion hcl) .... Take one by mouth dialy 8)  Zetia 10 Mg Tabs (Ezetimibe) .... Take one by mouth daily 9)  Citracal Plus Tabs (Multiple minerals-vitamins) .... Take 2 by mouth daily 10)  Co Q-10 50 Mg Caps (Coenzyme q10) .... Take one by mouth daily 11)  Glucosamine-chondroitin 750-600 Mg Tabs (Glucosamine-chondroitin) .... Take 2 by mouth daily 12)  Saw Palmetto 450 Mg Caps (Saw palmetto (serenoa repens)) .... Take one by mouth daily 13)  Fexofenadine Hcl 180 Mg Tabs (Fexofenadine hcl) .Marland Kitchen.. 1 daily as needed for allergies 14)  Fish Oil 1000 Mg Caps (Omega-3 fatty acids) .... Take 4 by mouth once daily 15)  Aspirin 81 Mg Tabs (Aspirin) .... Take 1 by mouth once daily  Patient Instructions: 1)  Please schedule a follow-up appointment in 6 months .    Orders Added: 1)  Est. Patient Level IV [id number]    Current Allergies (reviewed today): ! PENICILLIN V POTASSIUM (PENICILLIN V POTASSIUM) ! SULFA

## 2010-09-07 NOTE — Assessment & Plan Note (Signed)
Summary: dyspnea, COPD   Visit Type:  Initial Consult Copy to:  [name], MD Primary Provider/Referring Provider:  [name], MD  CC:  Pulmonary consult for dyspnea...pt c/o increased SOB w/ activity or exertion.  History of Present Illness: 77 yo man, former smoker, CAD s/p MI 1/12, A Fib, allergies, exertional SOB for a few yrs. This has been worse since recent hosp in 1/12 - was cathed x 2, no intervention made, course c/b renal failure. 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 No hypoxemia reported with exercise. Was sent from the hospital on metoprolol (new). He has heard wheezing, especially when nasal allergies.   Preventive Screening-Counseling & Management  Alcohol-Tobacco     Alcohol drinks/day: 0     Smoking Status: quit     Packs/Day: 2.5     Year Started: 1950     Year Quit: 1980     Pack years: 75pk-yrs  Current Medications (verified): 1)  Metoprolol Tartrate 25 Mg Tabs (Metoprolol Tartrate) .... Take 1 By Mouth Two Times A Day 2)  Coumadin 5 Mg Tabs (Warfarin Sodium) .... Use As Directed 3)  Verapamil Hcl Cr 240 Mg Cr-Tabs (Verapamil Hcl) .... Take One By Mouth Daily 4)  Lipitor 20 Mg Tabs (Atorvastatin Calcium) .... Take One Tablet By Mouth Daily 5)  Lexapro 20 Mg Tabs (Escitalopram Oxalate) .... Take 1 Tabs  By Mouth Daily 6)  Tricor 145 Mg Tabs (Fenofibrate) .... Take One By Mouth Daily 7)  Budeprion Xl 300 Mg Xr24h-Tab (Bupropion Hcl) .... Take One By Mouth Dialy 8)  Zetia 10 Mg Tabs (Ezetimibe) .... Take One By Mouth Daily 9)  Citracal Plus  Tabs (Multiple Minerals-Vitamins) .... Take 2 By Mouth Daily 10)  Co Q-10 50 Mg Caps (Coenzyme Q10) .... Take One By Mouth Daily 11)  Glucosamine-Chondroitin 750-600 Mg Tabs (Glucosamine-Chondroitin) .... Take 2 By Mouth Daily 12)  Saw Palmetto 450 Mg Caps (Saw Palmetto (Serenoa Repens)) .... Take One By Mouth Daily 13)  Fexofenadine Hcl 180 Mg Tabs (Fexofenadine Hcl) .Marland Kitchen.. 1 Daily As Needed For  Allergies 14)  Fish Oil 1000 Mg Caps (Omega-3 Fatty Acids) .... Take 4 By Mouth Once Daily 15)  Aspirin 81 Mg Tabs (Aspirin) .... Take 1 By Mouth Once Daily  Allergies (verified): 1)  ! Penicillin V Potassium (Penicillin V Potassium) 2)  ! Sulfa  Past History:  Past Medical History: Last updated: 01/12/2010 Anemia-NOS Anticoagulation therapy Anxiety----------------------------------------------------------Dr Toy Care Atrial fibrillation---------------------------------------------------Dr Tamala Julian Coronary artery disease Hyperlipidemia Hypertension COPD Allergic rhinitis Osteoarthritis Colonic polyps, hx of Hyperplastic polyp  2001 Adenomatous Polyp 2001  Family History: Last updated: 2010/01/22 Dad died @47  MI Mom died from chemical induced cancer (leukemia?) @81  Only child CAD on dad's side No HTN, DM No prostate cancer Family History of Colon Cancer:Mother   Past Surgical History: Reviewed history from 07/12/2010 and no changes required. 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 Has designated wife, then step daughter [name] have health  care POA. Not sure about DNR. Would leave decisions about feeding tube to wife at this time Daily Caffeine Use: 4 dailyAlcohol drinks/day:  0 Packs/Day:  2.5 Pack years:  75pk-yrs  Vital Signs:  Patient  profile:   77 year old male Height:      63 inches (160.02 cm) Weight:      156.13 pounds (70.97 kg) BMI:     27.76 O2 Sat:      94 % on Room air Temp:     97.9 degrees F (36.61 degrees C) oral Pulse rate:   69 / minute BP sitting:   112 / 70  (left arm) Cuff size:   regular  Vitals Entered By: Francesca Jewett CMA (August 28, 2010 4:35 PM)  O2 Sat at Rest %:  94 O2 Flow:  Room air CC: Pulmonary consult for dyspnea...pt c/o increased SOB w/ activity or exertion Is Patient Diabetic? No Comments Medications reviewed with patient Francesca Jewett CMA  August 28, 2010 4:47 PM   Physical Exam  General:  well developed, well nourished, in no acute distress Head:  normocephalic and atraumatic Eyes:  conjunctiva and sclera clear Mouth:  no erythema and no exudates.   Lungs:  clear bilaterally to auscultation Heart:  irreg irreg no M Msk:  no joint tenderness and no joint swelling.   Extremities:  no significant edema Neurologic:  alert & oriented X3 and strength normal in all extremities.   Psych:  alert and cooperative; normal mood and affect; normal attention span and concentration   Impression & Recommendations:  Problem # 1:  DYSPNEA/SHORTNESS OF BREATH (ICD-786.09)  Spiro consistent with COPD (done in Dr Alla German office), superimposed on his known CAD/A Fib. His dyspnea seems to be worse since d/c from the hosp - ? whether could be related to his b-blocker. Could consider dose change or changing to bisoprolol, selective b-blocker.  - trial SABA before exercise - full PFT - will consider bisoprolol or dose change metop with Drs Gerhard Munch  Orders: Consultation Level IV [phone])  Problem # 2:  ALLERGIC RHINITIS (ICD-477.9)  His updated medication list for this problem includes:    Fexofenadine Hcl 180 Mg Tabs (Fexofenadine hcl) .Marland Kitchen... 1 daily as needed for allergies  Patient Instructions: 1)  We will start ProAir, 2 puffs 15 minutes prior to exerxise, or as needed for shortness of  breath  2)  We will perform full pulmonary function testing on the date of your next visit 3)  We may need to discuss your b-blocker dosing with Dr Tamala Julian.  4)  Follow up with Dr Lamonte Sakai in 3 -4 weeks.

## 2010-09-12 ENCOUNTER — Telehealth: Payer: Self-pay | Admitting: *Deleted

## 2010-09-12 NOTE — Telephone Encounter (Signed)
Please tell him I recommend my partners at [facility] Please give him the number and let them know that I am "making a referral" for ongoing care---so they will be expecting his call and give him an appt

## 2010-09-12 NOTE — Telephone Encounter (Signed)
Pt wants to get a 2nd cardiology opinion on his recent problems.  He is asking that you recommend someone, prefers to go to [facility].  He will call to set up his own appointment.

## 2010-09-13 NOTE — Telephone Encounter (Signed)
Left message on home answering machine asking pt to return my call.

## 2010-09-13 NOTE — Telephone Encounter (Signed)
I am unsure about why he wants to go to [facility] Does he have a specific concern he wants addressed??

## 2010-09-13 NOTE — Telephone Encounter (Signed)
Pt called back.  He and his wife have discussed his going to another cardiologist, but now he instead wants a referral to [facility].  He has done some research and now thinks that is where he needs to  Go.

## 2010-09-18 ENCOUNTER — Ambulatory Visit (INDEPENDENT_AMBULATORY_CARE_PROVIDER_SITE_OTHER): Payer: MEDICARE | Admitting: Emergency Medicine

## 2010-09-18 DIAGNOSIS — R0989 Other specified symptoms and signs involving the circulatory and respiratory systems: Secondary | ICD-10-CM

## 2010-09-18 DIAGNOSIS — R0609 Other forms of dyspnea: Secondary | ICD-10-CM

## 2010-09-18 DIAGNOSIS — J449 Chronic obstructive pulmonary disease, unspecified: Secondary | ICD-10-CM

## 2010-09-18 LAB — PULMONARY FUNCTION TEST

## 2010-09-18 NOTE — Progress Notes (Signed)
PFT done today. 

## 2010-09-18 NOTE — Telephone Encounter (Signed)
Pt want to go to [facility] about his breathing, he states he knows it's not his heart, he want a 2nd opinion to see if he can come off some of his medications, he thinks some of the meds are causing his breathing problems. I asked pt if the [facility] center is cardiology or pulmology? He stated he didn't know, that he thought it was a group of specialty dr's.?

## 2010-09-18 NOTE — Telephone Encounter (Signed)
Please see if you can help him

## 2010-09-18 NOTE — Telephone Encounter (Signed)
I believe their [place] is mostly for geriatric care, like those with dementia  If he wants to see a cardiologist there, he should try to get the name of someone ---or we may be able to refer him there for a second opinion without a specific name

## 2010-09-20 ENCOUNTER — Ambulatory Visit (INDEPENDENT_AMBULATORY_CARE_PROVIDER_SITE_OTHER): Payer: 59 | Admitting: Internal Medicine

## 2010-09-20 ENCOUNTER — Encounter: Payer: Self-pay | Admitting: Internal Medicine

## 2010-09-20 VITALS — BP 148/70 | HR 69 | Temp 98.0°F | Ht 63.0 in | Wt 161.0 lb

## 2010-09-20 DIAGNOSIS — J449 Chronic obstructive pulmonary disease, unspecified: Secondary | ICD-10-CM

## 2010-09-20 DIAGNOSIS — I4891 Unspecified atrial fibrillation: Secondary | ICD-10-CM

## 2010-09-20 DIAGNOSIS — I2510 Atherosclerotic heart disease of native coronary artery without angina pectoris: Secondary | ICD-10-CM

## 2010-09-20 DIAGNOSIS — R609 Edema, unspecified: Secondary | ICD-10-CM | POA: Insufficient documentation

## 2010-09-20 NOTE — Progress Notes (Signed)
  Subjective:    Patient ID: Eduardo [name], male    DOB: 1931/01/05, 77 y.o.   MRN: [id number]  HPI Saw Dr Lamonte Sakai-- using proair bid and prn Had beta blocker stopped by Dr Tamala Julian and verapamil instead (due to nightmares, not the dyspnea)  No real improvement  Finished Heart Track Made slight progress but limited by dyspnea Has had chronic atrial fibrillation for 20 years--this is not new Had been evaluated and had procedure due to his dyspnea Worse now  Noisy at night Talks in sleep still  Now here due to concern about edema Just appeared recently Weight up slightly Not coughing No chest pain   Review of Systems Eating okay No GI problems    Objective:   Physical Exam  Constitutional: He appears well-developed and well-nourished. No distress.  Neck: Normal range of motion.  Cardiovascular: Normal rate and normal heart sounds.  Exam reveals no gallop.   No murmur heard.      Irregular Rate ~70  Pulmonary/Chest: Effort normal. No respiratory distress. He has no wheezes. He has no rales.  Musculoskeletal: He exhibits edema.       Trace edema in calves  Lymphadenopathy:    He has no cervical adenopathy.          Assessment & Plan:

## 2010-09-21 ENCOUNTER — Encounter: Payer: Self-pay | Admitting: Emergency Medicine

## 2010-09-22 ENCOUNTER — Ambulatory Visit (INDEPENDENT_AMBULATORY_CARE_PROVIDER_SITE_OTHER): Payer: 59 | Admitting: Emergency Medicine

## 2010-09-22 ENCOUNTER — Encounter: Payer: Self-pay | Admitting: Emergency Medicine

## 2010-09-22 DIAGNOSIS — J449 Chronic obstructive pulmonary disease, unspecified: Secondary | ICD-10-CM

## 2010-09-22 DIAGNOSIS — J309 Allergic rhinitis, unspecified: Secondary | ICD-10-CM

## 2010-09-22 MED ORDER — MOMETASONE FUROATE 50 MCG/ACT NA SUSP: 2.0000 | Freq: Every day | NASAL | Status: DC

## 2010-09-22 MED ORDER — BUDESONIDE-FORMOTEROL FUMARATE 160-4.5 MCG/ACT IN AERO
2.0000 | INHALATION_SPRAY | Freq: Two times a day (BID) | RESPIRATORY_TRACT | Status: DC
Start: 2010-09-22 — End: 2010-11-20

## 2010-09-22 MED ORDER — ALBUTEROL SULFATE HFA 108 (90 BASE) MCG/ACT IN AERS: 2.0000 | INHALATION_SPRAY | RESPIRATORY_TRACT | Status: DC | PRN

## 2010-09-22 NOTE — Progress Notes (Signed)
  Subjective:    Patient ID: Eduardo [name], male    DOB: 1931/06/06, 77 y.o.   MRN: [id number]  HPI 77 yo man, former smoker, CAD s/p MI 1/12, A Fib, allergies, exertional SOB for a few yrs. This has been worse since recent hosp in 1/12 - was cathed x 2, no intervention made, course c/b renal failure. 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 No hypoxemia reported with exercise. Was sent from the hospital on metoprolol (new). He has heard wheezing, especially when nasal allergies.   ROV 75/13/12 -- f/u SOB due to presumed COPD, CAD/MI, A Fib. Last time we started prn SABA to see if he would benefit - may have helped him some. He had full PFT's (09/18/10) . Dr Tamala Julian changed his metoprolol to verapamil but no change in breathing; he did get LE edema. He continues to have wheeze, cough, DOE. Also currently having severe PND and feels that it is contributing to his trouble. Taking allegra qd.    Review of Systems As per HPI    Objective:   Physical Exam Gen: Pleasant, well-nourished, in no distress,  normal affect  ENT: No lesions,  mouth clear,  oropharynx clear, no postnasal drip  Neck: No JVD, no TMG, no carotid bruits  Lungs: No use of accessory muscles, no dullness to percussion, clear without rales or rhonchi  Cardiovascular: RRR, heart sounds normal, no murmur or gallops, no peripheral edema  Musculoskeletal: No deformities, no cyanosis or clubbing  Neuro: alert, non focal  Skin: Warm, no lesions or rashes      Assessment & Plan:  ALLERGIC RHINITIS Add nasonex to allegra  COPD PFT's confirm moderately severe AFL. He may have benefitted from SABA, but not dramatically. Allergies seem to be contributing  - trial symbicort as he has borderline reversibility on spirometry - prn SABA

## 2010-09-22 NOTE — Assessment & Plan Note (Signed)
PFT's confirm moderately severe AFL. He may have benefitted from SABA, but not dramatically. Allergies seem to be contributing  - trial symbicort as he has borderline reversibility on spirometry - prn SABA

## 2010-09-22 NOTE — Assessment & Plan Note (Signed)
Add nasonex to [employer]

## 2010-09-22 NOTE — Patient Instructions (Addendum)
Start Symbicort 160/4.15mcg 2 puffs twice a day Use ProAir as needed Continue your allegra Start Nasonex, 2 sprays each nostril daily Follow with Dr Lamonte Sakai in 2 months or sooner if any problems.

## 2010-09-26 ENCOUNTER — Encounter: Payer: Self-pay | Admitting: Emergency Medicine

## 2010-10-03 NOTE — Telephone Encounter (Signed)
Dr [name], After you saw this patient does he still need a referral to [facility]? Pls advise, I got nowhere from talking to the patient as he wanted to see the Pulm first before deciding.

## 2010-10-04 ENCOUNTER — Telehealth: Payer: Self-pay | Admitting: Emergency Medicine

## 2010-10-04 DIAGNOSIS — J449 Chronic obstructive pulmonary disease, unspecified: Secondary | ICD-10-CM

## 2010-10-04 NOTE — Telephone Encounter (Signed)
lmomtcb x1 

## 2010-10-04 NOTE — Telephone Encounter (Signed)
atc x2 ,fast busy signal, wcb

## 2010-10-04 NOTE — Telephone Encounter (Signed)
Pt returned call.  He is wondering if RB feels he would be a candidate for pulm rehab.  Dr. Lamonte Sakai, pls advise.  Thanks!

## 2010-10-04 NOTE — Telephone Encounter (Signed)
Working with pulmonary now No further referral needed

## 2010-10-09 ENCOUNTER — Ambulatory Visit: Payer: MEDICARE | Admitting: Emergency Medicine

## 2010-10-09 NOTE — Telephone Encounter (Signed)
Yes, please notify pt that i will refer him for pulm rehab

## 2010-10-09 NOTE — Telephone Encounter (Signed)
Called, spoke with pt.  He is aware RB will be in office this evening and will call back once this has been addressed.  He verbalized understanding of this.    Dr. Lamonte Sakai, pt is calling back.  He wouldlike to know if he would be a candidate for pulm rehab.  Pls advise.  Thanks!

## 2010-10-09 NOTE — Telephone Encounter (Signed)
lmomtcb x1 to advise pt order was sent for pulm rehab.

## 2010-10-09 NOTE — Telephone Encounter (Signed)
Pt still waiting for a call back. [id number] or [phone].Marland Kitchen Pt would like to hear from someone today.

## 2010-10-09 NOTE — Telephone Encounter (Signed)
Pt aware order has been sent to [facility] and they will refax this order to [facility]. Per patient request.

## 2010-10-11 ENCOUNTER — Telehealth: Payer: Self-pay | Admitting: Emergency Medicine

## 2010-10-11 NOTE — Telephone Encounter (Signed)
Spoke w/ Marita Kansas and she gave me fax # [phone] to fax pt pft to over to get insurance to cover costs for his pulm rehab

## 2010-10-27 NOTE — Op Note (Signed)
NAME:  GRIFFON, LEYENDECKER            ACCOUNT NO.:  [id number]   MEDICAL RECORD NO.:  [id number]          PATIENT TYPE:  AMB   LOCATION:  [place]                          FACILITY:  [facility]   PHYSICIAN:  Rodney A. Mortenson, M.D.DATE OF BIRTH:  15-Dec-1930   DATE OF PROCEDURE:  07/27/2004  DATE OF DISCHARGE:                                 OPERATIVE REPORT   PREOPERATIVE DIAGNOSIS:  Tear of posterior horn of medial meniscus, left  knee.   POSTOPERATIVE DIAGNOSIS:  Tear of posterior horn of medial meniscus, left  knee.   SURGEON:  Geroge Baseman. Alphonzo Cruise, M.D.   SURGERY:  Arthroscopy and debridement of posterior horn, medial meniscus,  left knee.   ANESTHESIA:  MAC.   PATHOLOGY:  With the arthroscope, the knee was very carefully examination  was undertaken.  The superior pouch was visualized first.  It appeared  normal.  No synovitis.  The patellofemoral junction was absolutely normal  with normal articular cartilage on both sides of the joint.  The arthroscope  was then passed into the lateral compartment.  There was normal articular  cartilage over the lateral femoral condyle, lateral tibial plateau, and the  entire circumference of the lateral meniscus was normal.  The arthroscope  was placed in the intercondylar notch and the ACL was intact.  In the medial  compartment, there was normal articular cartilage of the medial femoral  condyle and medial tibial plateau, but there was a complex tear of the  posterior horn of the medial meniscus.  No other pathology was seen in the  knee.   DESCRIPTION OF PROCEDURE:  The patient was placed on the operating table in  the supine position with the pneumatic tourniquet about the left upper  thigh.  Left leg was placed in the leg holder.  Entire left lower extremity  was prepped with DuraPrep and draped out in the usual manner.  Marcaine was  placed in the knee and Xylocaine and epinephrine had been injected.  The  patient did have a nerve block.   An infusion cannula was placed in the  superior medial pouch and the knee distended with saline.  Anterior medial  and anterolateral portal was made and the arthroscope was introduced.  The  only pathology was seen in the medial compartment.  Through both medial and  lateral portals, a series of small baskets were inserted in the knee and the  posterior horn was aggressively debrided.  The frayed and torn portion of  the meniscus was completely decompressed.  This was followed with the intra-  articular shaver.  All debris was removed from the knee.  The rim was then  smoothed and balanced with a nice transition to the mid third of the medial  meniscus.  Excellent decompression of the knee was achieved.  A large bulky  compressive dressing was applied and the patient returned to the recovery  room in excellent condition.  Technically, this went extremely well.   FOLLOW-UP CARE:  1.  To my office on Wednesday.  2.  Percocet for pain.      RAM/MEDQ  D:  07/27/2004  T:  07/27/2004  Job:  [id number]

## 2010-10-27 NOTE — Procedures (Signed)
[place]  Patient:    Eduardo [name], Eduardo [name]                     MRN: [id number] Proc. Date: 11/28/99 Dictator:   Garlan Fair III, M.D. CC:         Hal T. Stoneking, M.D.                           Procedure Report  PROCEDURE:  Colonoscopy and polypectomy.  REFERRING PHYSICIAN:  Hal T. Stoneking, M.D.  INDICATIONS:  [name] is a 77 year old male.  He underwent his complete physical examination performed by Dr. Lajean Manes August 14, 1999. He submitted stool cards for hemoccult testing September 12, 1999.  One of six stool cards was positive for blood.  [name] developed colon cancer at approximately age 11.  [name] takes Coumadin chronically to prevent stroke secondary to chronic atrial fibrillation.  [name] viewed our colonoscopy education film and I discussed with him the complications associated with colonoscopy and polypectomy including intestinal bleeding and intestinal perforation.  [name] __________  MEDICATION ALLERGIES:  Sulfa.  CHRONIC MEDICATIONS:  Coumadin, Verapamil and digoxin.  PAST MEDICAL HISTORY:  Three remote mastoid operations, chronic atrial fibrillation, hypercholesterolemia and coronary artery disease.  ENDOSCOPIST:  [name], M.D.  PREMEDICATION:  Demerol 30 mg, Versed 5 mg.  ENDOSCOPE:  Olympus pediatric colonoscopy.  DESCRIPTION OF PROCEDURE:  After obtaining informed consent the patient was placed in the left lateral decubitus position.  I administered intravenous Demerol and intravenous Versed to achieve conscious sedation for the procedure.  The patients blood pressure, oxygen saturation and cardiac rhythm were monitored throughout the procedure and documented in the medical record.  Anal inspection was normal.  Digital rectal exam revealed a non-nodular prostate.  The Olympus pediatric videocolonoscope was introduced into the rectum and under  direct vision, advanced to the cecum and identified a normal-appearing ileocecal valve.  Colonic preparation for the exam today was excellent.  Rectum:  Large nonbleeding internal hemorrhoids.  A 1 mm sessile polyp was removed with the cold biopsy forceps from the distal rectum.  Sigmoid colon and descending colon normal.  Splenic flexure normal.  Transverse colon normal.  Hepatic flexure normal.  Ascending colon normal.  Cecum and ileocecal valve:  From the proximal cecum a 1 mm sessile polyp was removed with the cold biopsy forceps.  ASSESSMENT: 1.  Large nonbleeding internal hemorrhoids. 2.  From the proximal cecum a 1 mm sessile polyp was removed with the cold biopsy forceps; from the distal rectum a 1 mm sessile polyp was removed with the cold biopsy forceps.  Both polyps were submitted in one bottle for pathological evaluation.  RECOMMENDATIONS:  Repeat colonoscopy in five years. DD:  11/28/99 TD:  11/30/99 Job: [id number]

## 2010-11-15 ENCOUNTER — Encounter: Payer: Self-pay | Admitting: Emergency Medicine

## 2010-11-15 ENCOUNTER — Ambulatory Visit (INDEPENDENT_AMBULATORY_CARE_PROVIDER_SITE_OTHER): Payer: 59 | Admitting: Emergency Medicine

## 2010-11-15 VITALS — BP 118/70 | HR 81 | Temp 98.0°F | Ht 65.0 in | Wt 158.4 lb

## 2010-11-15 DIAGNOSIS — J449 Chronic obstructive pulmonary disease, unspecified: Secondary | ICD-10-CM

## 2010-11-15 NOTE — Patient Instructions (Signed)
Stop Symbicort now Continue to have your ProAir available to use as needed Continue your allegra every day Start Spiriva once a day ONE WEEK AFTER this visit as a trial to see if it helps your breathing.  Follow up in 1 month to review your symptoms.

## 2010-11-15 NOTE — Progress Notes (Signed)
  Subjective:    Patient ID: Eduardo [name], male    DOB: [birth year], 77 y.o.   MRN: [id number]  HPI 77 yo man, former smoker, CAD s/p MI 1/12, A Fib, allergies, exertional SOB for a few yrs. This has been worse since recent hosp in 1/12 - was cathed x 2, no intervention made, course c/b renal failure. 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 No hypoxemia reported with exercise. Was sent from the hospital on metoprolol (new). He has heard wheezing, especially when nasal allergies. Arlyce Harman at Dr Alla German office suggested AFL/COPD.   ROV 09/22/10 -- f/u SOB due to presumed COPD, CAD/MI, A Fib. Last time we started prn SABA to see if he would benefit - may have helped him some. He had full PFT's (09/18/10) . Dr Tamala Julian changed his metoprolol to verapamil but no change in breathing; he did get LE edema. He continues to have wheeze, cough, DOE. Also currently having severe PND and feels that it is contributing to his trouble. Taking allegra qd.  ROV 11/15/10 -- follow up for multifactorial dyspnea, CAD, probable COPD, allergic rhinitis. Returns today after we started Symbicort at last visit. He didn't fel that the SymbicorContinues to do Rehab at [place]. The therapists were concerned that he was having exertional dyspnea, having to stop during workouts. Over last few days more allergy sx, on fexafenadine.    Review of Systems As per HPI    Objective:   Physical Exam  Gen: Pleasant, well-nourished, in no distress,  normal affect  ENT: No lesions,  mouth clear,  oropharynx clear, no postnasal drip  Neck: No JVD, no TMG, no carotid bruits  Lungs: No use of accessory muscles, no dullness to percussion, clear without rales or rhonchi  Cardiovascular: RRR, heart sounds normal, no murmur or gallops, no peripheral edema  Musculoskeletal: No deformities, no cyanosis or clubbing  Neuro: alert, non focal  Skin: Warm, no lesions or rashes       Assessment & Plan:

## 2010-11-15 NOTE — Assessment & Plan Note (Signed)
Hasn't benefited from Symbicort, in fact UA irritation may be worse - stop symbicort,  - have ProAir available prn - trial spiriva after a week on no long-acting meds - rov 1 month

## 2010-11-20 ENCOUNTER — Encounter: Payer: Self-pay | Admitting: Internal Medicine

## 2010-11-20 ENCOUNTER — Ambulatory Visit (INDEPENDENT_AMBULATORY_CARE_PROVIDER_SITE_OTHER): Payer: 59 | Admitting: Internal Medicine

## 2010-11-20 DIAGNOSIS — R609 Edema, unspecified: Secondary | ICD-10-CM

## 2010-11-20 NOTE — Assessment & Plan Note (Signed)
He is still concerned but it is really fairly minimal Likely due to the verapamil---which he needs for BP and rate control for atrial fib Discussed support socks (in cooler weather), elevation and avoiding salt If sig worsening, could try prn lasix 20mg 

## 2010-11-20 NOTE — Patient Instructions (Signed)
Please use salt substitute instead of salt Keep appt in September

## 2010-11-20 NOTE — Progress Notes (Signed)
Subjective:    Patient ID: Eduardo [name], male    DOB: 17-Mar-1931, 77 y.o.   MRN: [id number]  HPI Still with a lot of fluid in legs Didn't do well on alternate medication for BP from Dr Tamala Julian (beta blocker?)  Has gone for pulmonary rehab symbicort didn't seem to be helping Now on spiriva--will start in 2 days  Breathing was better for awhile Still notes easy fatigue on machines at the gym No chest pain  No palpitations --he doesn't feel the atrial fib  Swelling is "bad" every day Not too bad in the AM but worsens as the day goes on  Current Outpatient Prescriptions on File Prior to Visit  Medication Sig Dispense Refill  . albuterol (PROAIR HFA) 108 (90 BASE) MCG/ACT inhaler Inhale 2 puffs into the lungs every 4 (four) hours as needed for wheezing or shortness of breath. 2 puffs 15 minutes prior to exerxise, or as needed for shortness of breath  1 Inhaler  11  . aspirin 81 MG tablet Take 81 mg by mouth daily.        Marland Kitchen buPROPion (WELLBUTRIN XL) 300 MG 24 hr tablet Take 1 tablet by mouth daily       . Coenzyme Q10 50 MG CAPS Take by mouth.        . fenofibrate (TRICOR) 145 MG tablet Take 145 mg by mouth daily.        . fexofenadine (ALLEGRA) 180 MG tablet Take 180 mg by mouth daily.        . fish oil-omega-3 fatty acids 1000 MG capsule Take 4 g by mouth daily.        . fluocinonide (LIDEX) 0.05 % cream Apply twice daily      . Glucosamine-Chondroitin 750-600 MG TABS Take by mouth. 2 tablets       . LEXAPRO 20 MG tablet 1 1/2 tablets daily      . LIPITOR 80 MG tablet Take 1 tablet by mouth daily      . mometasone (NASONEX) 50 MCG/ACT nasal spray 2 sprays by Nasal route daily.  17 g  6  . Multiple Minerals-Vitamins (CITRACAL PLUS PO) Take by mouth 2 (two) times daily.        . Saw Palmetto 450 MG CAPS Take by mouth daily.        . verapamil (VERELAN PM) 240 MG 24 hr capsule Take 1 tablet by mouth daily       . warfarin (COUMADIN) 5 MG tablet Use as directed      . DISCONTD:  budesonide-formoterol (SYMBICORT) 160-4.5 MCG/ACT inhaler Inhale 2 puffs into the lungs 2 (two) times daily.  1 Inhaler  12  . DISCONTD: ezetimibe (ZETIA) 10 MG tablet Take 10 mg by mouth daily.        Marland Kitchen DISCONTD: VOLTAREN 1 % GEL As needed       Past Medical History  Diagnosis Date  . Anemia   . Anxiety   . Atrial fibrillation   . CAD (coronary artery disease)   . Hyperlipidemia   . Hypertension   . COPD (chronic obstructive pulmonary disease)   . Allergy   . Osteoarthritis   . Hx of colonic polyps   . Hyperplastic polyp of intestine 2001  . Adenomatous polyp 2001    Past Surgical History  Procedure Date  . Cardiac catheterization 1992    50% LAD and luminal irreg in RCA, 1/12  Post cath MI from damage  . Appendectomy   .  Tonsillectomy   . Carpal tunnel release     left  . Mastoidectomy     x3 on right  . Pilonidal cyst / sinus excision   . Knee arthroscopy     left, right knee 10/2009  . Shoulder arthroscopy     right  . Foot fracture surgery     right heel repair  . Nasal septum surgery     nasal septal repair  . Penile prosthesis implant     x 2--got infection after 2nd and had to remove    Family History  Problem Relation Age of Onset  . Diabetes Neg Hx   . Hypertension Neg Hx   . Heart attack Father   . Cancer Mother     ? leukemia    History   Social History  . Marital Status: Married    Spouse Name: N/A    Number of Children: 4  . Years of Education: N/A   Occupational History  . retired Chief Strategy Officer the job trainin)    Social History Main Topics  . Smoking status: Former Smoker -- 2.5 packs/day for 30 years    Types: Cigarettes    Quit date: 06/11/1978  . Smokeless tobacco: Never Used  . Alcohol Use: No     QUIT [id number] alxoholic  . Drug Use: Not on file  . Sexually Active: Not on file   Other Topics Concern  . 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 Quit in 1980--alcoholicHas living will. Has designated wife, then step daughter [name] have health care POA. Not sure about DNR. Would leave decisions about feeding tube to wife at this timeDaily Caffeine Use: 4 dailyAlcohol drinks/day:  0Packs/Day:  2.5Pack years:  75pk-yrs   Review of Systems Weight is down over the past 2 months--has been working on this Sleeping okay  No PND--no sig orthopnea either    Objective:   Physical Exam  Constitutional: He appears well-developed and well-nourished. No distress.  Neck: Normal range of motion. Neck supple. No thyromegaly present.  Cardiovascular: Normal rate, normal heart sounds and intact distal pulses.  Exam reveals no gallop.   No murmur heard.      irregular  Pulmonary/Chest: Effort normal and breath sounds normal. No respiratory distress. He has no wheezes. He has no rales.  Abdominal: Soft. He exhibits no mass. There is no tenderness.  Musculoskeletal: Normal range of motion. He exhibits edema. He exhibits no tenderness.       Slight pitting along his tibias but no sig swelling  Lymphadenopathy:    He has no cervical adenopathy.  Skin: Skin is warm. No rash noted.          Assessment & Plan:

## 2010-11-22 ENCOUNTER — Ambulatory Visit: Payer: 59 | Admitting: Emergency Medicine

## 2010-12-08 ENCOUNTER — Ambulatory Visit: Payer: Self-pay | Admitting: Internal Medicine

## 2010-12-11 ENCOUNTER — Telehealth: Payer: Self-pay | Admitting: Emergency Medicine

## 2010-12-11 MED ORDER — TIOTROPIUM BROMIDE MONOHYDRATE 18 MCG IN CAPS: 18.0000 ug | ORAL_CAPSULE | Freq: Every day | RESPIRATORY_TRACT | Status: DC

## 2010-12-11 NOTE — Telephone Encounter (Signed)
Rx for spiriva was sent to pharm. Spoke with pt's spouse and notified this was done.

## 2010-12-15 ENCOUNTER — Ambulatory Visit (INDEPENDENT_AMBULATORY_CARE_PROVIDER_SITE_OTHER): Payer: 59 | Admitting: Emergency Medicine

## 2010-12-15 ENCOUNTER — Encounter: Payer: Self-pay | Admitting: Emergency Medicine

## 2010-12-15 DIAGNOSIS — J4489 Other specified chronic obstructive pulmonary disease: Secondary | ICD-10-CM

## 2010-12-15 DIAGNOSIS — J309 Allergic rhinitis, unspecified: Secondary | ICD-10-CM

## 2010-12-15 DIAGNOSIS — J449 Chronic obstructive pulmonary disease, unspecified: Secondary | ICD-10-CM

## 2010-12-15 NOTE — Progress Notes (Signed)
  Subjective:    Patient ID: Eduardo [name], male    DOB: May 31, 1931, 77 y.o.   MRN: [id number]  HPI 77 yo man, former smoker, CAD s/p MI 1/12, A Fib, allergies, exertional SOB for a few yrs. This has been worse since recent hosp in 1/12 - was cathed x 2, no intervention made, course c/b renal failure. 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 No hypoxemia reported with exercise. Was sent from the hospital on metoprolol (new). He has heard wheezing, especially when nasal allergies. Arlyce Harman at Dr Alla German office suggested AFL/COPD.   ROV 09/22/10 -- f/u SOB due to presumed COPD, CAD/MI, A Fib. Last time we started prn SABA to see if he would benefit - may have helped him some. He had full PFT's (09/18/10) . Dr Tamala Julian changed his metoprolol to verapamil but no change in breathing; he did get LE edema. He continues to have wheeze, cough, DOE. Also currently having severe PND and feels that it is contributing to his trouble. Taking allegra qd.  ROV 11/15/10 -- follow up for multifactorial dyspnea, CAD, probable COPD, allergic rhinitis. Returns today after we started Symbicort at last visit. He didn't fel that the SymbicorContinues to do Rehab at [place]. The therapists were concerned that he was having exertional dyspnea, having to stop during workouts. Over last few days more allergy sx, on fexafenadine.   ROV 12/15/10 -- probable COPD, CAD, allergic rhinitis. He didn't improve w Symbicort, so we started Spiriva last visit. He believes that his breathing is a bit better. He has done better at Rehab on eliptical and treadmill. No hypoxemia. He is taking both allegra and zyrtec for allergies, as well as nasonex - still has severe PND and associated cough.   Review of Systems As per HPI    Objective:  Physical Exam  Gen: Pleasant, well-nourished, in no distress,  normal affect  ENT: No lesions,  mouth clear,  oropharynx clear, no postnasal drip  Neck: No JVD, no TMG, no carotid bruits  Lungs:  No use of accessory muscles, no dullness to percussion, clear without rales or rhonchi  Cardiovascular: RRR, heart sounds normal, no murmur or gallops, no peripheral edema  Musculoskeletal: No deformities, no cyanosis or clubbing  Neuro: alert, non focal  Skin: Warm, no lesions or rashes  Assessment & Plan:  ALLERGIC RHINITIS Will stop allegra, continue zyrtec Start NSWs Stop Nasonex Start Astepro  COPD Continue Spiriva qd

## 2010-12-15 NOTE — Assessment & Plan Note (Signed)
Will stop allegra, continue zyrtec Start NSWs Stop Nasonex Start Astepro

## 2010-12-15 NOTE — Assessment & Plan Note (Signed)
Continue Spiriva qd

## 2010-12-15 NOTE — Patient Instructions (Signed)
Stop Allegra Continue your Zyretc daily Start doing nasal saline washes every day If you are still having drainage after a week on washes, please start Astepro nasal spray, 1 -2 sprays each nostril twice a day.  Continue your Spiriva daily Follow up in 6 weeks

## 2011-01-10 ENCOUNTER — Encounter: Payer: Self-pay | Admitting: Family Medicine

## 2011-01-10 ENCOUNTER — Ambulatory Visit (INDEPENDENT_AMBULATORY_CARE_PROVIDER_SITE_OTHER): Payer: 59 | Admitting: Family Medicine

## 2011-01-10 VITALS — BP 120/84 | HR 67 | Temp 97.6°F | Wt 160.2 lb

## 2011-01-10 DIAGNOSIS — Z4802 Encounter for removal of sutures: Secondary | ICD-10-CM | POA: Insufficient documentation

## 2011-01-10 NOTE — Assessment & Plan Note (Signed)
4 simple interrupted sutures removed easily and routine instructions give to pt.

## 2011-01-10 NOTE — Patient Instructions (Signed)
Take care and let us know if you have other concerns.  Have your leg checked if you have pus/drainage/redness/pain.

## 2011-01-10 NOTE — Progress Notes (Signed)
ER f/u.  Seen at ER 12/26/10.  Cut L lateral knee on a beach chair.  He got stitched and needs removal today.  Td given on 12/26/10.    Feeling well.  Meds, vitals, and allergies reviewed.   ROS: See HPI.  Otherwise, noncontributory.  nad L knee with lateral wound, now with good tissue opposition.  4 simple interrupted sutures removed, still with good opposition.  Tolerated well.  No retained FB noted on exam.  Covered with bandaid.

## 2011-01-29 ENCOUNTER — Ambulatory Visit (INDEPENDENT_AMBULATORY_CARE_PROVIDER_SITE_OTHER): Payer: 59 | Admitting: Emergency Medicine

## 2011-01-29 ENCOUNTER — Encounter: Payer: Self-pay | Admitting: Emergency Medicine

## 2011-01-29 DIAGNOSIS — J309 Allergic rhinitis, unspecified: Secondary | ICD-10-CM

## 2011-01-29 DIAGNOSIS — J449 Chronic obstructive pulmonary disease, unspecified: Secondary | ICD-10-CM

## 2011-01-29 NOTE — Assessment & Plan Note (Signed)
continue Spiriva 

## 2011-01-29 NOTE — Assessment & Plan Note (Signed)
-   zyrtec - NSW every night  - experiment with trading astepro for nasonex x 1 week. He will call if her feels better and wants a script called in.  - rov 6 months

## 2011-01-29 NOTE — Progress Notes (Signed)
  Subjective:    Patient ID: Eduardo [name], male    DOB: 05-19-1931, 77 y.o.   MRN: [id number]  HPI 77 yo man, former smoker, CAD s/p MI 1/12, A Fib, allergies, exertional SOB for a few yrs. This has been worse since recent hosp in 1/12 - was cathed x 2, no intervention made, course c/b renal failure. 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 No hypoxemia reported with exercise. Was sent from the hospital on metoprolol (new). He has heard wheezing, especially when nasal allergies. Arlyce Harman at Dr Alla German office suggested AFL/COPD.   ROV 09/22/10 -- f/u SOB due to presumed COPD, CAD/MI, A Fib. Last time we started prn SABA to see if he would benefit - may have helped him some. He had full PFT's (09/18/10) . Dr Tamala Julian changed his metoprolol to verapamil but no change in breathing; he did get LE edema. He continues to have wheeze, cough, DOE. Also currently having severe PND and feels that it is contributing to his trouble. Taking allegra qd.  ROV 11/15/10 -- follow up for multifactorial dyspnea, CAD, probable COPD, allergic rhinitis. Returns today after we started Symbicort at last visit. He didn't fel that the SymbicorContinues to do Rehab at [place]. The therapists were concerned that he was having exertional dyspnea, having to stop during workouts. Over last few days more allergy sx, on fexafenadine.   ROV 12/15/10 -- probable COPD, CAD, allergic rhinitis. He didn't improve w Symbicort, so we started Spiriva last visit. He believes that his breathing is a bit better. He has done better at Rehab on eliptical and treadmill. No hypoxemia. He is taking both allegra and zyrtec for allergies, as well as nasonex - still has severe PND and associated cough.   ROB 01/29/11 -- probable COPD, CAD, allergic rhinitis. Last time we discussed change from nasal steroid to astelin + NSW + zyrtec. He did not make the change. He isn't having the same degree of cough, but still with nasal gtt. Remains on Spiriva and  doing well.   Review of Systems As per HPI    Objective:  Physical Exam  Gen: Pleasant, well-nourished, in no distress,  normal affect  ENT: No lesions,  mouth clear,  oropharynx clear, no postnasal drip  Neck: No JVD, no TMG, no carotid bruits  Lungs: No use of accessory muscles, no dullness to percussion, clear without rales or rhonchi  Cardiovascular: RRR, heart sounds normal, no murmur or gallops, no peripheral edema  Musculoskeletal: No deformities, no cyanosis or clubbing  Neuro: alert, non focal  Skin: Warm, no lesions or rashes  Assessment & Plan:  COPD - continue Spiriva  ALLERGIC RHINITIS - zyrtec - NSW every night  - experiment with trading astepro for nasonex x 1 week. He will call if her feels better and wants a script called in.  - rov 6 months

## 2011-01-29 NOTE — Patient Instructions (Signed)
Continue your Spiriva daily Continue your nasal saline washes daily. You could increase to twice a day.  We will continue your Zyrtec Stop nasonex and start astepro 2 sprays each side twice a day for the next week Call our office if you prefer the astepro.  Follow up with Dr Lamonte Sakai in 6 months or sooner if you have any problems.

## 2011-02-08 ENCOUNTER — Telehealth: Payer: Self-pay | Admitting: Emergency Medicine

## 2011-02-08 MED ORDER — AZELASTINE HCL 0.15 % NA SOLN: NASAL | Status: DC

## 2011-02-08 NOTE — Telephone Encounter (Signed)
Pt states the astepro has helped him a lot and per RB can send rx if he likes it. rx has been sent and pt aware

## 2011-02-13 ENCOUNTER — Ambulatory Visit (INDEPENDENT_AMBULATORY_CARE_PROVIDER_SITE_OTHER): Payer: 59 | Admitting: Internal Medicine

## 2011-02-13 ENCOUNTER — Encounter: Payer: Self-pay | Admitting: Internal Medicine

## 2011-02-13 DIAGNOSIS — I4891 Unspecified atrial fibrillation: Secondary | ICD-10-CM

## 2011-02-13 DIAGNOSIS — R609 Edema, unspecified: Secondary | ICD-10-CM

## 2011-02-13 DIAGNOSIS — F411 Generalized anxiety disorder: Secondary | ICD-10-CM

## 2011-02-13 DIAGNOSIS — D649 Anemia, unspecified: Secondary | ICD-10-CM

## 2011-02-13 NOTE — Assessment & Plan Note (Signed)
Good rate control Needs to stay on the coumadin

## 2011-02-13 NOTE — Progress Notes (Signed)
Subjective:    Patient ID: Eduardo [name], male    DOB: 06-Dec-1930, 77 y.o.   MRN: [id number]  HPI Does feel that his breathing is better Did finish the pulmonary rehab 10% improvement on walking test Has been able to hike 3 miles again (though does have to rest)  Not happy with his bruising Wonders about aspirin instead---explained that the recent studies were not for atrial fib No sig palpitations  No sig edema Still notices a little No chest pain  Anxiety has been okay Not really depressed  Current Outpatient Prescriptions on File Prior to Visit  Medication Sig Dispense Refill  . albuterol (PROAIR HFA) 108 (90 BASE) MCG/ACT inhaler Inhale 2 puffs into the lungs every 4 (four) hours as needed for wheezing or shortness of breath. 2 puffs 15 minutes prior to exerxise, or as needed for shortness of breath  1 Inhaler  11  . aspirin 81 MG tablet Take 81 mg by mouth daily.        . Azelastine HCl (ASTEPRO) 0.15 % SOLN 2 sprays each nostril twice a day  30 mL  3  . buPROPion (WELLBUTRIN XL) 300 MG 24 hr tablet Take 1 tablet by mouth daily       . cetirizine (ZYRTEC) 10 MG tablet Take 10 mg by mouth daily.        . Coenzyme Q10 50 MG CAPS Take by mouth.        . fenofibrate (TRICOR) 145 MG tablet Take 145 mg by mouth daily.        . fish oil-omega-3 fatty acids 1000 MG capsule Take 4 g by mouth daily.        . fluocinonide (LIDEX) 0.05 % cream Apply twice daily      . LEXAPRO 20 MG tablet 1 1/2 tablets daily      . LIPITOR 80 MG tablet Take 1 tablet by mouth daily      . Multiple Minerals-Vitamins (CITRACAL PLUS PO) Take by mouth 2 (two) times daily.        Marland Kitchen tiotropium (SPIRIVA HANDIHALER) 18 MCG inhalation capsule Place 1 capsule (18 mcg total) into inhaler and inhale daily.  30 capsule  5  . verapamil (VERELAN PM) 240 MG 24 hr capsule Take 1 tablet by mouth daily       . warfarin (COUMADIN) 5 MG tablet Use as directed        Allergies  Allergen Reactions  . Penicillins     . Sulfonamide Derivatives     Past Medical History  Diagnosis Date  . Anemia   . Anxiety   . Atrial fibrillation   . CAD (coronary artery disease)   . Hyperlipidemia   . Hypertension   . COPD (chronic obstructive pulmonary disease)   . Allergy   . Osteoarthritis   . Hx of colonic polyps   . Hyperplastic polyp of intestine 2001  . Adenomatous polyp 2001    Past Surgical History  Procedure Date  . Cardiac catheterization 1992    50% LAD and luminal irreg in RCA, 1/12  Post cath MI from damage  . Appendectomy   . Tonsillectomy   . Carpal tunnel release     left  . Mastoidectomy     x3 on right  . Pilonidal cyst / sinus excision   . Knee arthroscopy     left, right knee 10/2009  . Shoulder arthroscopy     right  . Foot fracture surgery  right heel repair  . Nasal septum surgery     nasal septal repair  . Penile prosthesis implant     x 2--got infection after 2nd and had to remove    Family History  Problem Relation Age of Onset  . Diabetes Neg Hx   . Hypertension Neg Hx   . Heart attack Father   . Cancer Mother     ? leukemia    History   Social History  . Marital Status: Married    Spouse Name: N/A    Number of Children: 4  . Years of Education: N/A   Occupational History  . retired Chief Strategy Officer the job trainin)    Social History Main Topics  . Smoking status: Former Smoker -- 2.5 packs/day for 30 years    Types: Cigarettes    Quit date: 06/11/1978  . Smokeless tobacco: Never Used  . Alcohol Use: No     QUIT [id number] alxoholic  . Drug Use: Not on file  . Sexually Active: Not on file   Other Topics Concern  . 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 Quit in 1980--alcoholicHas living will. Has designated wife, then step daughter [name] have  health care POA. Not sure about DNR. Would leave decisions about feeding tube to wife at this timeDaily Caffeine Use: 4 dailyAlcohol drinks/day:  0Packs/Day:  2.5Pack years:  75pk-yrs   Review of Systems Has night time awakening--gets up and has some coffee--and them may get back to sleep Takes afternoon nap---20 -90 minutes. Doesn't refresh him though Goes to sleep about 10PM---awakens 3-4 AM then back to sleep 6-8AM. Generally refreshed in AM    Objective:   Physical Exam  Constitutional: He appears well-developed and well-nourished. No distress.  Neck: Normal range of motion. Neck supple. No thyromegaly present.  Cardiovascular: Normal rate, normal heart sounds and intact distal pulses.  Exam reveals no gallop.   No murmur heard.      irregular  Pulmonary/Chest: Effort normal and breath sounds normal. No respiratory distress. He has no wheezes. He has no rales.  Musculoskeletal: He exhibits no edema and no tenderness.  Lymphadenopathy:    He has no cervical adenopathy.  Psychiatric: He has a normal mood and affect. His behavior is normal. Judgment and thought content normal.          Assessment & Plan:

## 2011-02-13 NOTE — Assessment & Plan Note (Signed)
Seems back to baseline No sig issues No reason to change verapamil

## 2011-02-13 NOTE — Assessment & Plan Note (Signed)
Controlled on the lexapro No changes appropriate

## 2011-02-13 NOTE — Assessment & Plan Note (Signed)
Recent blood work at [facility] He will get me a copy

## 2011-02-23 ENCOUNTER — Telehealth: Payer: Self-pay | Admitting: *Deleted

## 2011-02-23 NOTE — Telephone Encounter (Signed)
Received faxed lab results from [facility], per note written by pt ok to leave message on machine. Per Dr.Letvak he will check blood count at next visit, lab results scanned in.

## 2011-03-01 ENCOUNTER — Ambulatory Visit: Payer: MEDICARE | Admitting: Internal Medicine

## 2011-03-09 ENCOUNTER — Ambulatory Visit: Payer: Self-pay | Admitting: Internal Medicine

## 2011-04-13 ENCOUNTER — Other Ambulatory Visit: Payer: Self-pay | Admitting: Emergency Medicine

## 2011-04-13 DIAGNOSIS — J449 Chronic obstructive pulmonary disease, unspecified: Secondary | ICD-10-CM

## 2011-04-13 MED ORDER — AZELASTINE HCL 0.15 % NA SOLN: NASAL | Status: DC

## 2011-04-13 MED ORDER — TIOTROPIUM BROMIDE MONOHYDRATE 18 MCG IN CAPS: 18.0000 ug | ORAL_CAPSULE | Freq: Every day | RESPIRATORY_TRACT | Status: DC

## 2011-04-13 MED ORDER — ALBUTEROL SULFATE HFA 108 (90 BASE) MCG/ACT IN AERS: 2.0000 | INHALATION_SPRAY | RESPIRATORY_TRACT | Status: DC | PRN

## 2011-04-24 ENCOUNTER — Telehealth: Payer: Self-pay | Admitting: Emergency Medicine

## 2011-04-24 DIAGNOSIS — J449 Chronic obstructive pulmonary disease, unspecified: Secondary | ICD-10-CM

## 2011-04-24 MED ORDER — ALBUTEROL SULFATE HFA 108 (90 BASE) MCG/ACT IN AERS: 2.0000 | INHALATION_SPRAY | RESPIRATORY_TRACT | Status: DC | PRN

## 2011-04-24 NOTE — Telephone Encounter (Signed)
Rx for spiriva and albuterol was sent 04/13/11. Looks like the albuterol printed instead of going electronically. i have re-[facility]. LMOMTCB to make pt aware of this

## 2011-04-25 NOTE — Telephone Encounter (Signed)
Pt is aware Proair rx has been resent to [facility] and will call only if he does not receive this. He is not out of the medication. He did receive the Spiriva.

## 2011-05-14 ENCOUNTER — Encounter: Payer: Self-pay | Admitting: Emergency Medicine

## 2011-05-14 ENCOUNTER — Ambulatory Visit (INDEPENDENT_AMBULATORY_CARE_PROVIDER_SITE_OTHER): Payer: 59 | Admitting: Emergency Medicine

## 2011-05-14 DIAGNOSIS — J449 Chronic obstructive pulmonary disease, unspecified: Secondary | ICD-10-CM

## 2011-05-14 DIAGNOSIS — J309 Allergic rhinitis, unspecified: Secondary | ICD-10-CM

## 2011-05-14 NOTE — Progress Notes (Signed)
  Subjective:    Patient ID: Eduardo [name], male    DOB: Feb 10, 1931, 77 y.o.   MRN: [id number] HPI 77 yo man, former smoker, CAD s/p MI 1/12, A Fib, allergies, exertional SOB for a few yrs. This has been worse since recent hosp in 1/12 - was cathed x 2, no intervention made, course c/b renal failure. 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 No hypoxemia reported with exercise. Was sent from the hospital on metoprolol (new). He has heard wheezing, especially when nasal allergies. Arlyce Harman at Dr Alla German office suggested AFL/COPD.   ROV 09/22/10 -- f/u SOB due to presumed COPD, CAD/MI, A Fib. Last time we started prn SABA to see if he would benefit - may have helped him some. He had full PFT's (09/18/10) . Dr Tamala Julian changed his metoprolol to verapamil but no change in breathing; he did get LE edema. He continues to have wheeze, cough, DOE. Also currently having severe PND and feels that it is contributing to his trouble. Taking allegra qd.  ROV 11/15/10 -- follow up for multifactorial dyspnea, CAD, probable COPD, allergic rhinitis. Returns today after we started Symbicort at last visit. He didn't fel that the SymbicorContinues to do Rehab at [facility]. The therapists were concerned that he was having exertional dyspnea, having to stop during workouts. Over last few days more allergy sx, on fexafenadine.   ROV 12/15/10 -- probable COPD, CAD, allergic rhinitis. He didn't improve w Symbicort, so we started Spiriva last visit. He believes that his breathing is a bit better. He has done better at Rehab on eliptical and treadmill. No hypoxemia. He is taking both allegra and zyrtec for allergies, as well as nasonex - still has severe PND and associated cough.   ROB 01/29/11 -- probable COPD, CAD, allergic rhinitis. Last time we discussed change from nasal steroid to astelin + NSW + zyrtec. He did not make the change. He isn't having the same degree of cough, but still with nasal gtt. Remains on Spiriva and  doing well.   ROV 05/14/11 -- probable COPD, CAD, allergic rhinitis. Returns for f/u. Last time changed nasal steroid to astepro. Still has significant nasal gtt, wheeze in the am, clearing mucous from throat. Arlyce Harman 4/12 was not impressive for significant AFL.   Review of Systems As per HPI    Objective:  Physical Exam  Gen: Pleasant, well-nourished, in no distress,  normal affect  ENT: No lesions,  mouth clear,  oropharynx clear, no postnasal drip  Neck: No JVD, no TMG, no carotid bruits  Lungs: No use of accessory muscles, no dullness to percussion, clear without rales or rhonchi  Cardiovascular: RRR, heart sounds normal, no murmur or gallops, no peripheral edema  Musculoskeletal: No deformities, no cyanosis or clubbing  Neuro: alert, non focal  Skin: Warm, no lesions or rashes  Assessment & Plan:  COPD - mod severe AFL, but has never felt better on BD's - restart exercise routine and follow overall clinical status.    ALLERGIC RHINITIS - still w poor control - same regimen for now but consider allergy referral.

## 2011-05-14 NOTE — Assessment & Plan Note (Signed)
-   still w poor control - same regimen for now but consider allergy referral.

## 2011-05-14 NOTE — Patient Instructions (Signed)
We will restart a scheduled exercise regimen Please continue your nasal spray and allergy regimen as you are taking it.  We may decide to refer you to an allergist at some point in the future Follow with Dr Lamonte Sakai in 6 months or sooner if you have any problems.

## 2011-05-14 NOTE — Assessment & Plan Note (Signed)
-   mod severe AFL, but has never felt better on BD's - restart exercise routine and follow overall clinical status.

## 2011-05-23 ENCOUNTER — Ambulatory Visit (INDEPENDENT_AMBULATORY_CARE_PROVIDER_SITE_OTHER): Payer: 59 | Admitting: Family Medicine

## 2011-05-23 ENCOUNTER — Encounter: Payer: Self-pay | Admitting: Family Medicine

## 2011-05-23 DIAGNOSIS — M792 Neuralgia and neuritis, unspecified: Secondary | ICD-10-CM

## 2011-05-23 DIAGNOSIS — B0223 Postherpetic polyneuropathy: Secondary | ICD-10-CM

## 2011-05-23 DIAGNOSIS — IMO0002 Reserved for concepts with insufficient information to code with codable children: Secondary | ICD-10-CM

## 2011-05-23 DIAGNOSIS — B029 Zoster without complications: Secondary | ICD-10-CM

## 2011-05-23 MED ORDER — ACYCLOVIR 800 MG PO TABS: ORAL_TABLET | ORAL | Status: DC

## 2011-05-23 NOTE — Progress Notes (Signed)
  Patient Name: Eduardo [name] Date of Birth: [birth year] Age: 77 y.o. Medical Record Number: [id number] Gender: male  History of Present Illness:  Eduardo [name] is a 77 y.o. very pleasant male patient who presents with the following:  Left sided arm shingles showed 3 days ago.   L sided arm pain dorsum started about 4 d ago, then rash showed up on upper arm. Painful. Also hit his hand less than a week ago. Intermittent bruisinng, on coumadin. No systemic symptoms. No fever, chills, sweats. Has had zostavax in the past.   Past Medical History, Surgical History, Social History, Family History, and Problem List have been reviewed in EHR and updated if relevant.  Review of Systems: ROS: GEN: Acute illness details above GI: Tolerating PO intake GU: maintaining adequate hydration and urination Pulm: No SOB Interactive and getting along well at home.  Otherwise, ROS is as per the HPI.   Physical Examination: Filed Vitals:   05/23/11 1520  BP: 120/60  Pulse: 60  Temp: 97.6 F (36.4 C)  TempSrc: Oral  Height: 5\' 5"  (1.651 m)  Weight: 162 lb 12.8 oz (73.846 kg)  SpO2: 98%    Body mass index is 27.09 kg/(m^2).   GEN: WDWN, NAD, Non-toxic, Alert & Oriented x 3 HEENT: Atraumatic, Normocephalic.  Ears and Nose: No external deformity. EXTR: No clubbing/cyanosis/edema NEURO: Normal gait.  PSYCH: Normally interactive. Conversant. Not depressed or anxious appearing.  Calm demeanor.  SKIN: scattered vesicles, L upper arm. Bruising b arms, L torso  Assessment and Plan: Shingles: Acyclovir. Counselled. No contact with newborns / infants. Discussed POC, length, possibility of long term pain

## 2011-06-12 DIAGNOSIS — J449 Chronic obstructive pulmonary disease, unspecified: Secondary | ICD-10-CM

## 2011-06-12 HISTORY — DX: Chronic obstructive pulmonary disease, unspecified: J44.9

## 2011-07-13 HISTORY — PX: CATARACT EXTRACTION W/ INTRAOCULAR LENS IMPLANT: SHX1309

## 2011-08-14 ENCOUNTER — Ambulatory Visit (INDEPENDENT_AMBULATORY_CARE_PROVIDER_SITE_OTHER): Payer: 59 | Admitting: Internal Medicine

## 2011-08-14 ENCOUNTER — Encounter: Payer: Self-pay | Admitting: Internal Medicine

## 2011-08-14 VITALS — BP 148/68 | HR 79 | Temp 97.8°F | Ht 65.0 in | Wt 163.0 lb

## 2011-08-14 DIAGNOSIS — E785 Hyperlipidemia, unspecified: Secondary | ICD-10-CM

## 2011-08-14 DIAGNOSIS — I10 Essential (primary) hypertension: Secondary | ICD-10-CM

## 2011-08-14 DIAGNOSIS — F411 Generalized anxiety disorder: Secondary | ICD-10-CM

## 2011-08-14 DIAGNOSIS — I4891 Unspecified atrial fibrillation: Secondary | ICD-10-CM

## 2011-08-14 DIAGNOSIS — J449 Chronic obstructive pulmonary disease, unspecified: Secondary | ICD-10-CM

## 2011-08-14 DIAGNOSIS — I2510 Atherosclerotic heart disease of native coronary artery without angina pectoris: Secondary | ICD-10-CM

## 2011-08-14 LAB — BASIC METABOLIC PANEL
BUN: 22 mg/dL (ref 6–23)
GFR: 85.57 mL/min (ref >60.00)
Glucose, Bld: 112 mg/dL — ABNORMAL HIGH (ref 70–99)
Potassium: 4.3 mEq/L (ref 3.5–5.1)

## 2011-08-14 LAB — CBC WITH DIFFERENTIAL/PLATELET
Basophils Absolute: 0 10*3/uL (ref 0.0–0.1)
Eosinophils Absolute: 0.4 10*3/uL (ref 0.0–0.7)
HCT: 32.1 % — ABNORMAL LOW (ref 39.0–52.0)
Lymphs Abs: 1.6 10*3/uL (ref 0.7–4.0)
MCHC: 32.2 g/dL (ref 30.0–36.0)
Monocytes Absolute: 0.5 10*3/uL (ref 0.1–1.0)
Monocytes Relative: 7.9 % (ref 3.0–12.0)
Platelets: 190 10*3/uL (ref 150.0–400.0)
RDW: 16.7 % — ABNORMAL HIGH (ref 11.5–14.6)

## 2011-08-14 LAB — TSH: TSH: 1.62 u[IU]/mL (ref 0.35–5.50)

## 2011-08-14 NOTE — Assessment & Plan Note (Signed)
Rate is well controlled Dr Tamala Julian monitors protimes

## 2011-08-14 NOTE — Assessment & Plan Note (Signed)
BP Readings from Last 3 Encounters:  08/14/11 148/68  05/23/11 120/60  05/14/11 130/84   Slightly up this time but has been fine No changes for now Due for repeat creatinine as it is chronically elevated

## 2011-08-14 NOTE — Assessment & Plan Note (Signed)
Lipids okay recently

## 2011-08-14 NOTE — Assessment & Plan Note (Signed)
exercise tolerance has decreased.  Finished pulm rehab quite a while ago and not walking as much Told him to increase walking now that weather is improving

## 2011-08-14 NOTE — Assessment & Plan Note (Signed)
Has been doing okay but is not comfortable with decreasing his dose of lexapro

## 2011-08-14 NOTE — Assessment & Plan Note (Signed)
Seems to be quiet No changes needed

## 2011-08-14 NOTE — Progress Notes (Signed)
Subjective:    Patient ID: Eduardo [name], male    DOB: July 14, 1930, 78 y.o.   MRN: [id number]  HPI Doing okay Had recent NMR lipoprofile by Dr Eduardo [name] No changes in meds made He checks the protimes   Shingles did resolve Dr Eduardo [name] prescribed acyclovir in perioperative period  Had right cataract removed about 2 weeks ago  No heart trouble Saw Dr Eduardo [name] in December---no problems No palpitations but he knows it is irregular No chest pain  Had wheezing at night and more breathing problems last week Seems to be better now Can only walk a block without having to stop again ?weather/temperature dependent Has bad post nasal drip at night still  Mood okay No persistent anxiety No depression or anhedonia Has volunteered to help with pulm/cardiac rehab  Current Outpatient Prescriptions on File Prior to Visit  Medication Sig Dispense Refill  . aspirin 81 MG tablet Take 81 mg by mouth daily.        . Azelastine HCl (ASTEPRO) 0.15 % SOLN 2 sprays each nostril twice a day  90 mL  3  . buPROPion (WELLBUTRIN XL) 300 MG 24 hr tablet Take 1 tablet by mouth daily       . cetirizine (ZYRTEC) 10 MG tablet Take 10 mg by mouth daily.        . Coenzyme Q10 50 MG CAPS Take by mouth.        . fish oil-omega-3 fatty acids 1000 MG capsule Take 4 g by mouth daily.        Marland Kitchen LEXAPRO 20 MG tablet 1 1/2 tablets daily      . LIPITOR 80 MG tablet Take 1 tablet by mouth daily      . mometasone (ELOCON) 0.1 % cream Apply 1 application topically daily.        Marland Kitchen tiotropium (SPIRIVA HANDIHALER) 18 MCG inhalation capsule Place 1 capsule (18 mcg total) into inhaler and inhale daily.  90 capsule  3  . verapamil (VERELAN PM) 240 MG 24 hr capsule Take 1 tablet by mouth daily       . warfarin (COUMADIN) 5 MG tablet Use as directed        Allergies  Allergen Reactions  . Penicillins   . Sulfonamide Derivatives     Past Medical History  Diagnosis Date  . Anemia   . Anxiety   . Atrial fibrillation   . CAD  (coronary artery disease)   . Hyperlipidemia   . Hypertension   . COPD (chronic obstructive pulmonary disease)   . Allergy   . Osteoarthritis   . Hx of colonic polyps   . Hyperplastic polyp of intestine 2001  . Adenomatous polyp 2001    Past Surgical History  Procedure Date  . Cardiac catheterization 1992    50% LAD and luminal irreg in RCA, 1/12  Post cath MI from damage  . Appendectomy   . Tonsillectomy   . Carpal tunnel release     left  . Mastoidectomy     x3 on right  . Pilonidal cyst / sinus excision   . Knee arthroscopy     left, right knee 10/2009  . Shoulder arthroscopy     right  . Foot fracture surgery     right heel repair  . Nasal septum surgery     nasal septal repair  . Penile prosthesis implant     x 2--got infection after 2nd and had to remove  . Cataract extraction w/ intraocular lens implant  2/13    Dr Eduardo [name]    Family History  Problem Relation Age of Onset  . Diabetes Neg Hx   . Hypertension Neg Hx   . Heart attack Father   . Cancer Mother     ? leukemia    History   Social History  . Marital Status: Married    Spouse Name: N/A    Number of Children: 4  . Years of Education: N/A   Occupational History  . retired Chief Strategy Officer the job trainin)    Social History Main Topics  . Smoking status: Former Smoker -- 2.5 packs/day for 30 years    Types: Cigarettes    Quit date: 06/11/1978  . Smokeless tobacco: Never Used  . Alcohol Use: No     QUIT [id number] alxoholic  . Drug Use: Not on file  . Sexually Active: Not on file   Other Topics Concern  . Not on file   Social History Narrative   Counselling psychologist (on the job training)Grew up in [place] children [place], [place]  and near [place])--2 stepchildrenFormer Smoker--quit in 1980Alcohol use-no. Quit in 1980--alcoholicHas living will. Has designated wife, then step daughter Eduardo [name] have health care POA. Not sure about DNR. Would leave  decisions about feeding tube to wife at this timeDaily Caffeine Use: 4 dailyAlcohol drinks/day:  0Packs/Day:  2.5Pack years:  75pk-yrs   Review of Systems Sleeps very well--despite PND and occ wheezing. Will have cup of coffee and then can go back to sleep Appetite is fine Weight is stable     Objective:   Physical Exam  Constitutional: He appears well-developed and well-nourished. No distress.  Neck: Normal range of motion. Neck supple.  Cardiovascular: Normal rate and normal heart sounds.  Exam reveals no gallop.   No murmur heard.      irregular  Pulmonary/Chest: Effort normal and breath sounds normal. No respiratory distress. He has no wheezes. He has no rales.  Musculoskeletal: He exhibits edema.       Trace edema in calves  Lymphadenopathy:    He has no cervical adenopathy.  Psychiatric: He has a normal mood and affect. His behavior is normal. Thought content normal.          Assessment & Plan:

## 2011-08-21 ENCOUNTER — Ambulatory Visit: Payer: 59 | Admitting: Internal Medicine

## 2011-09-08 ENCOUNTER — Emergency Department: Payer: Self-pay | Admitting: Emergency Medicine

## 2011-09-08 LAB — PROTIME-INR
INR: 2.1
Prothrombin Time: 24.1 s — ABNORMAL HIGH

## 2011-09-08 LAB — BASIC METABOLIC PANEL WITH GFR
Anion Gap: 11
BUN: 20 mg/dL — ABNORMAL HIGH
Calcium, Total: 9 mg/dL
Chloride: 105 mmol/L
Co2: 26 mmol/L
Creatinine: 0.98 mg/dL
EGFR (African American): >60
EGFR (Non-African Amer.): >60
Glucose: 97 mg/dL
Osmolality: 286
Potassium: 3.9 mmol/L
Sodium: 142 mmol/L

## 2011-09-08 LAB — TROPONIN I: Troponin-I: <0.02 ng/mL

## 2011-09-08 LAB — CBC
HCT: 35.4 % — ABNORMAL LOW
HGB: 11.4 g/dL — ABNORMAL LOW
MCH: 27.2 pg
MCHC: 32.1 g/dL
MCV: 85 fL
Platelet: 203 x10 3/mm 3
RBC: 4.18 x10 6/mm 3 — ABNORMAL LOW
RDW: 14.7 % — ABNORMAL HIGH
WBC: 8.8 x10 3/mm 3

## 2011-10-09 ENCOUNTER — Encounter: Payer: Self-pay | Admitting: Internal Medicine

## 2011-10-09 ENCOUNTER — Ambulatory Visit (INDEPENDENT_AMBULATORY_CARE_PROVIDER_SITE_OTHER): Payer: 59 | Admitting: Internal Medicine

## 2011-10-09 VITALS — BP 148/70 | HR 82 | Temp 97.0°F | Ht 65.0 in | Wt 163.0 lb

## 2011-10-09 DIAGNOSIS — M549 Dorsalgia, unspecified: Secondary | ICD-10-CM

## 2011-10-09 MED ORDER — HYDROCODONE-ACETAMINOPHEN 5-325 MG PO TABS: 1.0000 | ORAL_TABLET | Freq: Four times a day (QID) | ORAL | Status: AC | PRN

## 2011-10-09 NOTE — Assessment & Plan Note (Signed)
Quick onset which suggest sprain but pattern concerning for spinal stenosis Hasn't tried reasonable therapy  Will try heat Acetaminophen--then hydrocodone prn If no better in a couple of weeks, will refer to ortho

## 2011-10-09 NOTE — Progress Notes (Signed)
Subjective:    Patient ID: Eduardo [name], male    DOB: 08-13-1930, 78 y.o.   MRN: [id number]  HPI Has bilateral low back pain--actually in buttocks Also has posterior calf pain in both legs Variable but can be severe at times Started 2 weeks ago  Comes on when standing briefly  Pain worse if he walks No injury Didn't do anything new Stops if he sits  Hasn't tried heat or any meds Thinks he had some compressed discs in the past Scattered doses of acetaminophen---not helping  Current Outpatient Prescriptions on File Prior to Visit  Medication Sig Dispense Refill  . aspirin 81 MG tablet Take 81 mg by mouth daily.        . Azelastine HCl (ASTEPRO) 0.15 % SOLN 2 sprays each nostril twice a day  90 mL  3  . buPROPion (WELLBUTRIN XL) 300 MG 24 hr tablet Take 1 tablet by mouth daily       . cetirizine (ZYRTEC) 10 MG tablet Take 10 mg by mouth daily.        . Coenzyme Q10 50 MG CAPS Take by mouth.        . fish oil-omega-3 fatty acids 1000 MG capsule Take 4 g by mouth daily.        Marland Kitchen LEXAPRO 20 MG tablet 1 1/2 tablets daily      . LIPITOR 80 MG tablet Take 1 tablet by mouth daily      . mometasone (ELOCON) 0.1 % cream Apply 1 application topically daily.        Marland Kitchen tiotropium (SPIRIVA HANDIHALER) 18 MCG inhalation capsule Place 1 capsule (18 mcg total) into inhaler and inhale daily.  90 capsule  3  . verapamil (VERELAN PM) 240 MG 24 hr capsule Take 1 tablet by mouth daily       . warfarin (COUMADIN) 5 MG tablet Use as directed        Allergies  Allergen Reactions  . Penicillins   . Sulfonamide Derivatives     Past Medical History  Diagnosis Date  . Anemia   . Anxiety   . Atrial fibrillation   . CAD (coronary artery disease)   . Hyperlipidemia   . Hypertension   . COPD (chronic obstructive pulmonary disease)   . Allergy   . Osteoarthritis   . Hx of colonic polyps   . Hyperplastic polyp of intestine 2001  . Adenomatous polyp 2001    Past Surgical History  Procedure  Date  . Cardiac catheterization 1992    50% LAD and luminal irreg in RCA, 1/12  Post cath MI from damage  . Appendectomy   . Tonsillectomy   . Carpal tunnel release     left  . Mastoidectomy     x3 on right  . Pilonidal cyst / sinus excision   . Knee arthroscopy     left, right knee 10/2009  . Shoulder arthroscopy     right  . Foot fracture surgery     right heel repair  . Nasal septum surgery     nasal septal repair  . Penile prosthesis implant     x 2--got infection after 2nd and had to remove  . Cataract extraction w/ intraocular lens implant 2/13    Dr [name]    Family History  Problem Relation Age of Onset  . Diabetes Neg Hx   . Hypertension Neg Hx   . Heart attack Father   . Cancer Mother     ?  leukemia    History   Social History  . Marital Status: Married    Spouse Name: N/A    Number of Children: 4  . Years of Education: N/A   Occupational History  . retired Chief Strategy Officer the job trainin)    Social History Main Topics  . Smoking status: Former Smoker -- 2.5 packs/day for 30 years    Types: Cigarettes    Quit date: 06/11/1978  . Smokeless tobacco: Never Used  . Alcohol Use: No     QUIT [id number] alxoholic  . Drug Use: Not on file  . Sexually Active: Not on file   Other Topics Concern  . 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 Quit in 1980--alcoholicHas living will. Has designated wife, then step daughter [name] have health care POA. Not sure about DNR. Would leave decisions about feeding tube to wife at this timeDaily Caffeine Use: 4 dailyAlcohol drinks/day:  0Packs/Day:  2.5Pack years:  75pk-yrs   Review of Systems No change in bowel or bladder function    Objective:   Physical Exam  Constitutional: He appears well-developed and  well-nourished. No distress.  Cardiovascular:       Faint pedal pulses  Musculoskeletal: He exhibits no edema.       Slight weakness at right hip---flexion and extension--compared to left No other focal findings No spine or back tenderness  Neurological:       Gait stiff at first---then improves Faint symmetric LE reflexes  Psychiatric: He has a normal mood and affect. His behavior is normal.          Assessment & Plan:

## 2011-10-09 NOTE — Patient Instructions (Signed)
Please try acetaminophen 650mg  three times daily. Use the stronger medication ---hydrocodone if pain persists. Try heat therapy on back and with hot bath Call for referral to orthopedist if not better in 2 weeks or so

## 2011-10-11 HISTORY — PX: CARPAL TUNNEL RELEASE: SHX101

## 2011-11-12 ENCOUNTER — Encounter (INDEPENDENT_AMBULATORY_CARE_PROVIDER_SITE_OTHER): Payer: Self-pay | Admitting: Surgery

## 2011-11-12 ENCOUNTER — Encounter (HOSPITAL_COMMUNITY): Payer: Self-pay

## 2011-11-12 ENCOUNTER — Emergency Department (HOSPITAL_COMMUNITY)
Admission: EM | Admit: 2011-11-12 | Discharge: 2011-11-12 | Discharge status: Against Medical Advice | Payer: 59 | Attending: Emergency Medicine | Admitting: Emergency Medicine

## 2011-11-12 DIAGNOSIS — R109 Unspecified abdominal pain: Secondary | ICD-10-CM | POA: Insufficient documentation

## 2011-11-12 DIAGNOSIS — M25539 Pain in unspecified wrist: Secondary | ICD-10-CM | POA: Insufficient documentation

## 2011-11-12 NOTE — ED Notes (Signed)
sts fell last week and hurt left wrist and now below ribs on  Left side he has worsening pain.

## 2011-11-13 ENCOUNTER — Ambulatory Visit
Admission: RE | Admit: 2011-11-13 | Discharge: 2011-11-13 | Disposition: A | Discharge status: Home / Self Care | Payer: Medicare Other | Source: Ambulatory Visit | Attending: Interventional Cardiology | Admitting: Interventional Cardiology

## 2011-11-13 ENCOUNTER — Other Ambulatory Visit: Payer: Self-pay | Admitting: Interventional Cardiology

## 2011-11-13 DIAGNOSIS — Z9181 History of falling: Secondary | ICD-10-CM

## 2011-11-13 DIAGNOSIS — R109 Unspecified abdominal pain: Secondary | ICD-10-CM

## 2011-11-13 MED ORDER — IOHEXOL 300 MG/ML  SOLN
125.0000 mL | Freq: Once | INTRAMUSCULAR | Status: AC | PRN
  Administered 2011-11-13: 125 mL via INTRAVENOUS

## 2011-11-16 ENCOUNTER — Ambulatory Visit (INDEPENDENT_AMBULATORY_CARE_PROVIDER_SITE_OTHER): Payer: Medicare Other | Admitting: Internal Medicine

## 2011-11-16 ENCOUNTER — Encounter: Payer: Self-pay | Admitting: Internal Medicine

## 2011-11-16 VITALS — BP 148/80 | HR 73 | Temp 98.2°F | Ht 65.0 in | Wt 165.0 lb

## 2011-11-16 DIAGNOSIS — M79605 Pain in left leg: Secondary | ICD-10-CM | POA: Insufficient documentation

## 2011-11-16 DIAGNOSIS — R200 Anesthesia of skin: Secondary | ICD-10-CM | POA: Insufficient documentation

## 2011-11-16 DIAGNOSIS — M79604 Pain in right leg: Secondary | ICD-10-CM | POA: Insufficient documentation

## 2011-11-16 DIAGNOSIS — R209 Unspecified disturbances of skin sensation: Secondary | ICD-10-CM

## 2011-11-16 DIAGNOSIS — M79609 Pain in unspecified limb: Secondary | ICD-10-CM

## 2011-11-16 NOTE — Assessment & Plan Note (Signed)
Probably related to spinal stenosis Will set up PT for strength and balance training Check circulation ----to exclude possible vascular cause

## 2011-11-16 NOTE — Assessment & Plan Note (Signed)
Has distal lower extremity sensory loss Causing instability with walking and 2 recent falls Suspect this is related to spinal stenosis Will check B12 level though

## 2011-11-16 NOTE — Progress Notes (Signed)
Subjective:    Patient ID: Eduardo [name], male    DOB: 23-Feb-1931, 78 y.o.   MRN: [id number]  HPI Here with wife  [place] [address] 8 days ago--bent over to pick up something and kept going forward [place] onto left hand and damaged his ribs (sig bruising) Saw Dr Worthy Keeler done to exclude splenic damage Has left wrist sprain  Then fell in dining room Bruise on side now  Some numbness in feet Feels that his legs don't support him Has walking stick now---will get cane Legs get tired walking short distances or up stairs Sensation changes go up the leg  Current Outpatient Prescriptions on File Prior to Visit  Medication Sig Dispense Refill  . albuterol (PROVENTIL HFA;VENTOLIN HFA) 108 (90 BASE) MCG/ACT inhaler Inhale 2 puffs into the lungs every 6 (six) hours as needed. For shortness of breath      . aspirin 81 MG tablet Take 81 mg by mouth daily.        . Azelastine HCl (ASTEPRO) 0.15 % SOLN 2 sprays each nostril twice a day  90 mL  3  . buPROPion (WELLBUTRIN XL) 300 MG 24 hr tablet Take 1 tablet by mouth daily       . cetirizine (ZYRTEC) 10 MG tablet Take 10 mg by mouth daily.        . Coenzyme Q10 50 MG CAPS Take by mouth.        . fish oil-omega-3 fatty acids 1000 MG capsule Take 4 g by mouth daily.       Marland Kitchen LEXAPRO 20 MG tablet 1 1/2 tablets daily      . LIPITOR 80 MG tablet Take 1 tablet by mouth daily      . mometasone (ELOCON) 0.1 % cream Apply 1 application topically daily.        Marland Kitchen tiotropium (SPIRIVA HANDIHALER) 18 MCG inhalation capsule Place 1 capsule (18 mcg total) into inhaler and inhale daily.  90 capsule  3  . verapamil (VERELAN PM) 240 MG 24 hr capsule Take 1 tablet by mouth daily       . warfarin (COUMADIN) 5 MG tablet Take 5 mg by mouth at bedtime. Use as directed      . DISCONTD: albuterol (PROVENTIL HFA;VENTOLIN HFA) 108 (90 BASE) MCG/ACT inhaler Inhale 2 puffs into the lungs every 4 (four) hours as needed. 2 puffs 15 minutes prior to exerxise, or as needed for shortness  of breath         Allergies  Allergen Reactions  . Penicillins     itching  . Sulfonamide Derivatives     itching    Past Medical History  Diagnosis Date  . Anemia   . Anxiety   . Atrial fibrillation   . CAD (coronary artery disease)   . Hyperlipidemia   . Hypertension   . COPD (chronic obstructive pulmonary disease)   . Allergy   . Osteoarthritis   . Hx of colonic polyps   . Hyperplastic polyp of intestine 2001  . Adenomatous polyp 2001    Past Surgical History  Procedure Date  . Cardiac catheterization 1992    50% LAD and luminal irreg in RCA, 1/12  Post cath MI from damage  . Appendectomy   . Tonsillectomy   . Carpal tunnel release     left  . Mastoidectomy     x3 on right  . Pilonidal cyst / sinus excision   . Knee arthroscopy     left, right knee 10/2009  .  Shoulder arthroscopy     right  . Foot fracture surgery     right heel repair  . Nasal septum surgery     nasal septal repair  . Penile prosthesis implant     x 2--got infection after 2nd and had to remove  . Cataract extraction w/ intraocular lens implant 2/13    Dr [name]  . Carpal tunnel release 10/11/11    Dr Rush Barer    Family History  Problem Relation Age of Onset  . Diabetes Neg Hx   . Hypertension Neg Hx   . Heart attack Father   . Cancer Mother     ? leukemia    History   Social History  . Marital Status: Married    Spouse Name: N/A    Number of Children: 4  . Years of Education: N/A   Occupational History  . retired Chief Strategy Officer the job trainin)    Social History Main Topics  . Smoking status: Former Smoker -- 2.5 packs/day for 30 years    Types: Cigarettes    Quit date: 06/11/1978  . Smokeless tobacco: Never Used  . Alcohol Use: No     QUIT [id number] alxoholic  . Drug Use: Not on file  . Sexually Active: Not on file   Other Topics Concern  . 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 Quit in 1980--alcoholicHas living will. Has designated wife, then step daughter [name] have health care POA. Not sure about DNR. Would leave decisions about feeding tube to wife at this timeDaily Caffeine Use: 4 dailyAlcohol drinks/day:  0Packs/Day:  2.5Pack years:  75pk-yrs   Review of Systems Bowels are fine No loss of control of urine    Objective:   Physical Exam  Constitutional: He appears well-developed and well-nourished. No distress.  Cardiovascular:       Feet seem to be well perfused but no pulses felt  Pulmonary/Chest:       Pain from rib contusions---makes movement difficult  Musculoskeletal:       No spine tenderness Normal ROM of hips without pain  Neurological:       No focal weakness Trace knee reflexes and absent ankle---but symmetric Normal tone Decreased fine touch sensation in both feet Full weight bearing---gait is not ataxic  Psychiatric: He has a normal mood and affect. His behavior is normal.       Anxious about medical issues          Assessment & Plan:

## 2011-11-20 ENCOUNTER — Encounter: Payer: Self-pay | Admitting: Internal Medicine

## 2011-11-22 ENCOUNTER — Encounter: Payer: Self-pay | Admitting: *Deleted

## 2011-12-10 ENCOUNTER — Encounter: Payer: Self-pay | Admitting: Internal Medicine

## 2011-12-20 ENCOUNTER — Ambulatory Visit (INDEPENDENT_AMBULATORY_CARE_PROVIDER_SITE_OTHER): Payer: Medicare Other | Admitting: Internal Medicine

## 2011-12-20 ENCOUNTER — Encounter: Payer: Self-pay | Admitting: Internal Medicine

## 2011-12-20 VITALS — BP 130/70 | HR 72 | Temp 98.0°F | Wt 162.0 lb

## 2011-12-20 DIAGNOSIS — S7000XA Contusion of unspecified hip, initial encounter: Secondary | ICD-10-CM

## 2011-12-20 DIAGNOSIS — S7002XA Contusion of left hip, initial encounter: Secondary | ICD-10-CM

## 2011-12-20 MED ORDER — HYDROCODONE-ACETAMINOPHEN 5-325 MG PO TABS: 1.0000 | ORAL_TABLET | Freq: Four times a day (QID) | ORAL | Status: DC | PRN

## 2011-12-20 NOTE — Assessment & Plan Note (Signed)
nothing to suggest fracture but pain is severe Renewed hydrocodone Continue ice Has follow up next week Needs to use cane in the house

## 2011-12-20 NOTE — Patient Instructions (Signed)
Please cancel the leg arterial circulation test since Dr Tamala Julian tested his circulation already

## 2011-12-20 NOTE — Progress Notes (Signed)
Subjective:    Patient ID: Eduardo [name], male    DOB: 06-18-1930, 78 y.o.   MRN: [id number]  HPI [place] again today Slipped and hit sharp edge of plant stand in house--then went to floor Was in bare feet on wood floor---wasn't using cane  Hit the lateral part of back on left Hurts bad--sharp pain Did ice pack it --put on arnica to help prevent bruising Tried voltaren gel  Able to walk with pain---barely  Current Outpatient Prescriptions on File Prior to Visit  Medication Sig Dispense Refill  . albuterol (PROVENTIL HFA;VENTOLIN HFA) 108 (90 BASE) MCG/ACT inhaler Inhale 2 puffs into the lungs every 6 (six) hours as needed. For shortness of breath      . aspirin 81 MG tablet Take 81 mg by mouth daily.        . Azelastine HCl (ASTEPRO) 0.15 % SOLN 2 sprays each nostril twice a day  90 mL  3  . buPROPion (WELLBUTRIN XL) 300 MG 24 hr tablet Take 1 tablet by mouth daily       . cetirizine (ZYRTEC) 10 MG tablet Take 10 mg by mouth daily.        . Coenzyme Q10 50 MG CAPS Take by mouth.        . fish oil-omega-3 fatty acids 1000 MG capsule Take 4 g by mouth daily.       Marland Kitchen HYDROcodone-acetaminophen (NORCO) 5-325 MG per tablet Take 1 tablet by mouth as needed.       Marland Kitchen LEXAPRO 20 MG tablet 1 1/2 tablets daily      . LIPITOR 80 MG tablet Take 1 tablet by mouth daily      . mometasone (ELOCON) 0.1 % cream Apply 1 application topically daily.        Marland Kitchen tiotropium (SPIRIVA HANDIHALER) 18 MCG inhalation capsule Place 1 capsule (18 mcg total) into inhaler and inhale daily.  90 capsule  3  . verapamil (VERELAN PM) 240 MG 24 hr capsule Take 1 tablet by mouth daily       . warfarin (COUMADIN) 5 MG tablet Take 5 mg by mouth at bedtime. Use as directed        Allergies  Allergen Reactions  . Penicillins     itching  . Sulfonamide Derivatives     itching    Past Medical History  Diagnosis Date  . Anemia   . Anxiety   . Atrial fibrillation   . CAD (coronary artery disease)   .  Hyperlipidemia   . Hypertension   . COPD (chronic obstructive pulmonary disease)   . Allergy   . Osteoarthritis   . Hx of colonic polyps   . Hyperplastic polyp of intestine 2001  . Adenomatous polyp 2001    Past Surgical History  Procedure Date  . Cardiac catheterization 1992    50% LAD and luminal irreg in RCA, 1/12  Post cath MI from damage  . Appendectomy   . Tonsillectomy   . Carpal tunnel release     left  . Mastoidectomy     x3 on right  . Pilonidal cyst / sinus excision   . Knee arthroscopy     left, right knee 10/2009  . Shoulder arthroscopy     right  . Foot fracture surgery     right heel repair  . Nasal septum surgery     nasal septal repair  . Penile prosthesis implant     x 2--got infection after 2nd and had to  remove  . Cataract extraction w/ intraocular lens implant 2/13    Dr [name]  . Carpal tunnel release 10/11/11    Dr Rush Barer    Family History  Problem Relation Age of Onset  . Diabetes Neg Hx   . Hypertension Neg Hx   . Heart attack Father   . Cancer Mother     ? leukemia    History   Social History  . Marital Status: Married    Spouse Name: N/A    Number of Children: 4  . Years of Education: N/A   Occupational History  . retired Chief Strategy Officer the job trainin)    Social History Main Topics  . Smoking status: Former Smoker -- 2.5 packs/day for 30 years    Types: Cigarettes    Quit date: 06/11/1978  . Smokeless tobacco: Never Used  . Alcohol Use: No     QUIT [id number] alxoholic  . Drug Use: Not on file  . Sexually Active: Not on file   Other Topics Concern  . 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 Quit in 1980--alcoholicHas living will. Has designated wife, then step daughter [name] have health care POA. Not sure  about DNR. Would leave decisions about feeding tube to wife at this timeDaily Caffeine Use: 4 dailyAlcohol drinks/day:  0Packs/Day:  2.5Pack years:  75pk-yrs   Review of Systems No hematuria Voiding okay     Objective:   Physical Exam  Constitutional: He appears well-developed and well-nourished. No distress.  Musculoskeletal:       Some point tenderness over lateral pelvis above hip on left Normal ROM of hip---including internal and external rotation--without pain No spine tenderness  Neurological:       Can bear weight but with pain          Assessment & Plan:

## 2011-12-24 ENCOUNTER — Telehealth: Payer: Self-pay | Admitting: Internal Medicine

## 2011-12-24 NOTE — Telephone Encounter (Signed)
Patient has appt on 12/28/11 and he would like to have his legs tested for circulation (arterial circulation).

## 2011-12-25 ENCOUNTER — Inpatient Hospital Stay (HOSPITAL_COMMUNITY)
Admission: AD | Admit: 2011-12-25 | Discharge: 2011-12-28 | DRG: 811 | Disposition: A | Discharge status: Home / Self Care | Payer: Medicare Other | Source: Ambulatory Visit | Attending: Interventional Cardiology | Admitting: Interventional Cardiology

## 2011-12-25 ENCOUNTER — Encounter (HOSPITAL_COMMUNITY): Payer: Self-pay | Admitting: General Practice

## 2011-12-25 DIAGNOSIS — R296 Repeated falls: Secondary | ICD-10-CM

## 2011-12-25 DIAGNOSIS — R609 Edema, unspecified: Secondary | ICD-10-CM

## 2011-12-25 DIAGNOSIS — J811 Chronic pulmonary edema: Secondary | ICD-10-CM

## 2011-12-25 DIAGNOSIS — I214 Non-ST elevation (NSTEMI) myocardial infarction: Secondary | ICD-10-CM | POA: Diagnosis present

## 2011-12-25 DIAGNOSIS — I10 Essential (primary) hypertension: Secondary | ICD-10-CM | POA: Diagnosis present

## 2011-12-25 DIAGNOSIS — J4489 Other specified chronic obstructive pulmonary disease: Secondary | ICD-10-CM | POA: Diagnosis present

## 2011-12-25 DIAGNOSIS — I2510 Atherosclerotic heart disease of native coronary artery without angina pectoris: Secondary | ICD-10-CM | POA: Diagnosis present

## 2011-12-25 DIAGNOSIS — I4891 Unspecified atrial fibrillation: Secondary | ICD-10-CM | POA: Diagnosis present

## 2011-12-25 DIAGNOSIS — J9620 Acute and chronic respiratory failure, unspecified whether with hypoxia or hypercapnia: Secondary | ICD-10-CM | POA: Diagnosis present

## 2011-12-25 DIAGNOSIS — I509 Heart failure, unspecified: Secondary | ICD-10-CM | POA: Diagnosis present

## 2011-12-25 DIAGNOSIS — J439 Emphysema, unspecified: Secondary | ICD-10-CM | POA: Diagnosis present

## 2011-12-25 DIAGNOSIS — N179 Acute kidney failure, unspecified: Secondary | ICD-10-CM | POA: Diagnosis present

## 2011-12-25 DIAGNOSIS — H109 Unspecified conjunctivitis: Secondary | ICD-10-CM | POA: Diagnosis present

## 2011-12-25 DIAGNOSIS — Z7982 Long term (current) use of aspirin: Secondary | ICD-10-CM

## 2011-12-25 DIAGNOSIS — J309 Allergic rhinitis, unspecified: Secondary | ICD-10-CM

## 2011-12-25 DIAGNOSIS — Z7901 Long term (current) use of anticoagulants: Secondary | ICD-10-CM

## 2011-12-25 DIAGNOSIS — D62 Acute posthemorrhagic anemia: Principal | ICD-10-CM | POA: Diagnosis present

## 2011-12-25 DIAGNOSIS — Z85038 Personal history of other malignant neoplasm of large intestine: Secondary | ICD-10-CM

## 2011-12-25 DIAGNOSIS — D649 Anemia, unspecified: Secondary | ICD-10-CM

## 2011-12-25 DIAGNOSIS — I5033 Acute on chronic diastolic (congestive) heart failure: Secondary | ICD-10-CM | POA: Diagnosis present

## 2011-12-25 DIAGNOSIS — R404 Transient alteration of awareness: Secondary | ICD-10-CM | POA: Diagnosis present

## 2011-12-25 DIAGNOSIS — T45515A Adverse effect of anticoagulants, initial encounter: Secondary | ICD-10-CM | POA: Diagnosis present

## 2011-12-25 DIAGNOSIS — I5032 Chronic diastolic (congestive) heart failure: Secondary | ICD-10-CM | POA: Diagnosis present

## 2011-12-25 DIAGNOSIS — J449 Chronic obstructive pulmonary disease, unspecified: Secondary | ICD-10-CM | POA: Diagnosis present

## 2011-12-25 DIAGNOSIS — S7002XA Contusion of left hip, initial encounter: Secondary | ICD-10-CM

## 2011-12-25 DIAGNOSIS — Y998 Other external cause status: Secondary | ICD-10-CM

## 2011-12-25 DIAGNOSIS — R791 Abnormal coagulation profile: Secondary | ICD-10-CM | POA: Diagnosis present

## 2011-12-25 DIAGNOSIS — D500 Iron deficiency anemia secondary to blood loss (chronic): Secondary | ICD-10-CM | POA: Diagnosis present

## 2011-12-25 DIAGNOSIS — W19XXXA Unspecified fall, initial encounter: Secondary | ICD-10-CM | POA: Diagnosis present

## 2011-12-25 DIAGNOSIS — I252 Old myocardial infarction: Secondary | ICD-10-CM | POA: Diagnosis present

## 2011-12-25 DIAGNOSIS — I482 Chronic atrial fibrillation, unspecified: Secondary | ICD-10-CM | POA: Diagnosis present

## 2011-12-25 HISTORY — DX: Acute myocardial infarction, unspecified: I21.9

## 2011-12-25 LAB — ABO/RH: ABO/RH(D): A POS

## 2011-12-25 LAB — CARDIAC PANEL(CRET KIN+CKTOT+MB+TROPI)
Relative Index: 2.4 (ref 0.0–2.5)
Total CK: 811 U/L — ABNORMAL HIGH (ref 7–232)

## 2011-12-25 LAB — MAGNESIUM: Magnesium: 2.2 mg/dL (ref 1.5–2.5)

## 2011-12-25 MED ORDER — ATORVASTATIN CALCIUM 80 MG PO TABS
80.0000 mg | ORAL_TABLET | Freq: Every day | ORAL | Status: DC
  Administered 2011-12-25 – 2011-12-27 (×3): 80 mg via ORAL
  Filled 2011-12-25 (×4): qty 1

## 2011-12-25 MED ORDER — COENZYME Q10 50 MG PO CAPS: 50.0000 mg | ORAL_CAPSULE | Freq: Every day | ORAL | Status: DC

## 2011-12-25 MED ORDER — ONDANSETRON HCL 4 MG/2ML IJ SOLN: 4.0000 mg | Freq: Four times a day (QID) | INTRAMUSCULAR | Status: DC | PRN

## 2011-12-25 MED ORDER — ESCITALOPRAM OXALATE 20 MG PO TABS
30.0000 mg | ORAL_TABLET | Freq: Every day | ORAL | Status: DC
  Administered 2011-12-25 – 2011-12-28 (×4): 30 mg via ORAL
  Filled 2011-12-25 (×4): qty 1

## 2011-12-25 MED ORDER — ACETAMINOPHEN 325 MG PO TABS
650.0000 mg | ORAL_TABLET | ORAL | Status: DC | PRN
  Administered 2011-12-26 – 2011-12-28 (×2): 650 mg via ORAL
  Filled 2011-12-25 (×2): qty 2

## 2011-12-25 MED ORDER — ALBUTEROL SULFATE HFA 108 (90 BASE) MCG/ACT IN AERS
2.0000 | INHALATION_SPRAY | Freq: Four times a day (QID) | RESPIRATORY_TRACT | Status: DC | PRN
  Administered 2011-12-26 (×2): 2 via RESPIRATORY_TRACT
  Filled 2011-12-25: qty 6.7

## 2011-12-25 MED ORDER — NITROGLYCERIN 0.4 MG SL SUBL: 0.4000 mg | SUBLINGUAL_TABLET | SUBLINGUAL | Status: DC | PRN

## 2011-12-25 MED ORDER — MOMETASONE FUROATE 0.1 % EX CREA
1.0000 "application " | TOPICAL_CREAM | Freq: Every day | CUTANEOUS | Status: AC
  Administered 2011-12-26 – 2011-12-27 (×2): 1 via TOPICAL
  Filled 2011-12-25: qty 15

## 2011-12-25 MED ORDER — BUPROPION HCL ER (XL) 300 MG PO TB24
300.0000 mg | ORAL_TABLET | Freq: Every day | ORAL | Status: DC
  Administered 2011-12-25 – 2011-12-28 (×4): 300 mg via ORAL
  Filled 2011-12-25 (×4): qty 1

## 2011-12-25 MED ORDER — TIOTROPIUM BROMIDE MONOHYDRATE 18 MCG IN CAPS
18.0000 ug | ORAL_CAPSULE | Freq: Every day | RESPIRATORY_TRACT | Status: DC
  Administered 2011-12-26 – 2011-12-28 (×3): 18 ug via RESPIRATORY_TRACT
  Filled 2011-12-25: qty 5

## 2011-12-25 MED ORDER — BUPROPION HCL ER (XL) 300 MG PO TB24: 300.0000 mg | ORAL_TABLET | Freq: Every day | ORAL | Status: DC

## 2011-12-25 MED ORDER — OMEGA-3-ACID ETHYL ESTERS 1 G PO CAPS
4.0000 g | ORAL_CAPSULE | Freq: Every day | ORAL | Status: DC
  Administered 2011-12-25 – 2011-12-28 (×4): 4 g via ORAL
  Filled 2011-12-25 (×4): qty 4

## 2011-12-25 MED ORDER — ESCITALOPRAM OXALATE 20 MG PO TABS: 30.0000 mg | ORAL_TABLET | Freq: Every day | ORAL | Status: DC

## 2011-12-25 MED ORDER — VERAPAMIL HCL ER 240 MG PO CP24: 240.0000 mg | ORAL_CAPSULE | Freq: Every day | ORAL | Status: DC

## 2011-12-25 MED ORDER — OMEGA-3-ACID ETHYL ESTERS 1 G PO CAPS: 4.0000 g | ORAL_CAPSULE | Freq: Every day | ORAL | Status: DC

## 2011-12-25 MED ORDER — VERAPAMIL HCL ER 240 MG PO TBCR: 240.0000 mg | EXTENDED_RELEASE_TABLET | Freq: Every day | ORAL | Status: DC

## 2011-12-25 MED ORDER — HYDROCODONE-ACETAMINOPHEN 5-325 MG PO TABS: 1.0000 | ORAL_TABLET | Freq: Four times a day (QID) | ORAL | Status: DC | PRN

## 2011-12-25 MED ORDER — OMEGA-3 FATTY ACIDS 1000 MG PO CAPS: 4.0000 g | ORAL_CAPSULE | Freq: Every day | ORAL | Status: DC

## 2011-12-25 MED ORDER — LORATADINE 10 MG PO TABS
10.0000 mg | ORAL_TABLET | Freq: Every day | ORAL | Status: DC
  Administered 2011-12-25 – 2011-12-27 (×3): 10 mg via ORAL
  Filled 2011-12-25 (×3): qty 1

## 2011-12-25 MED ORDER — VERAPAMIL HCL ER 240 MG PO TBCR
240.0000 mg | EXTENDED_RELEASE_TABLET | Freq: Every day | ORAL | Status: DC
  Administered 2011-12-25 – 2011-12-28 (×4): 240 mg via ORAL
  Filled 2011-12-25 (×4): qty 1

## 2011-12-25 NOTE — Progress Notes (Signed)
Critical lab value received, MD on call notified, no orders given. Will continue to monitor.

## 2011-12-25 NOTE — Progress Notes (Signed)
CRITICAL VALUE ALERT  Critical value received:  CKMB: 19.6, Troponin: 0.73  Date of notification:  12/25/11  Time of notification:  1940  Critical value read back:yes  Nurse who received alert:  Cherly Anderson  MD notified (1st page):  Dr. Oran Rein  Time of first page:  1947  MD notified (2nd page):  Time of second page:  Responding MD:  Dr. Oran Rein  Time MD responded:  [phone]

## 2011-12-25 NOTE — H&P (Signed)
[place]  Office Visit     Patient: Eduardo [name], Eduardo [name] Provider: Daneen Schick, MD  DOB: 20-Apr-1931 Age: 78 Y Sex: Male Date: 12/25/2011  Phone: [phone]   Address: [address] [place], [address]  Pcp: HAL STONEKING       Subjective:     CC:    1. Chest discomfort.        HPI:  General:  He fell last week rushing to the bathroom. He hit his left side terribly hard. He has large area of ecchymosis around his lower abdomen front and back as well as down into his thighs bilaterally. No obvious palpable hematomas noted. He has dyspnea. He has had intermittent unusual sensation in his chest. He had nausea and dry heaves this morning. These episodes have been occurring in waves. There is no orthopnea..        ROS:  CONSTITUTIONAL:  Patient denies chills, fatigue, fever, insomnia, night sweats, and anorexia.  CARDIOLOGY:  Chest tightness yes. no Claudication. Cough yes. no Cyanosis. Dyspnea on exertion yes. Edema yes. no Fatigue. no Irregular heart beat. Leg edema yes. no Murmurs. no Near Syncope. no Orthopnea. no Palpitations. no PND (paroxsymal nocturnal dyspnea). Signs of GI bleeding None. Snoring and/or insomnia None. Syncope no. Transient neurological symptoms None. Visual changes none.  RESPIRATORY:  Patient denies DOE (dyspnea on exertion), cough, blood-tinged sputum, hemoptysis, pain with breathing , wheezing.  MUSCULOSKELETAL:  Patient complaining of legs weak and increasing falls.Marland Kitchen  NEUROLOGY:  Patient denies recent head trauma.  PSYCHOLOGY:  Patient denies anxiety, mania, memory loss, nervousness, nightmares .         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        Surgical History: Appendectomy , Tonsillectomy , Carpal tunnel , pilonidal cyst excision , left and right knee arthroscopies , right shoulder arthroscopy , right heel repair , nasal septum repair , penile prosthesis implant , cataract .        Hospitalization/Major Diagnostic Procedure: catheterization complication with right coronary osteal dissection January 20 12 .        Family History: Father: deceased Myocardial infarction Mother: deceased Leukemia        Social History:  General:  History of smoking  cigarettes: Former smoker Quit in year 1975 no Smoking.  no Tobacco Exposure.  no Alcohol.  Exercise: yes.  Occupation: unemployed, retired.  Marital Status: married.        Medications: Aspir-81 81 MG Tablet Delayed Release 1 tablet Once a day, Lexapro 30 MG Tablet 1 tablet Once a day, Wellbutrin XL 300 MG Tablet Extended Release 24 Hour 1 tablet every morning Once a day, ProAir HFA 108 (90 Base) MCG/ACT Aerosol Solution 2 puffs as needed as directed, Spiriva HandiHaler 18 mcg once daily, Zyrtec Allergy 10 mg 1 tablet once daily, Astepro 0.15 % Solution as directed , Omega 3 1000 MG Capsule 4 CAPSULES DAILY, CoQ-10 50 MG Capsule 1 capsule with a meal Once a day, Nitroglycerin 0.4 mg 0.4 mg tablet 1 tablet as directed as directed prn chest pain, Verapamil HCl CR 240MG  Capsule Extended Release 24 Hour TAKE 1 CAPSULE EVERY MORNING ONCE A DAY , Atorvastatin Calcium 80 MG Tablet 1 tablet Once a day, Warfarin Sodium  5MG  Tablet per pharmD 5 mg qd except 7.5 mg M, Hydrocodone-Acetaminophen 5-325 MG Tablet 1 tablet as needed for pain as directed, Medication List reviewed and reconciled with the patient       Allergies: Penicillin (for allergy), Sulfa drugs (for allergy).       Objective:     Vitals: Wt 171.6, Wt change 6.6 lb, Ht 62.5, BMI 30.88, Pulse sitting 112, BP sitting 140/70, Oxygen sat % 97% on room air.        Examination:  Cardiology Exam:  GENERAL APPEARANCE: pleasant, pale, NAD, elderly, male, mildly obese. Marland Kitchen Dyspneic. O2 sat 93%.  HEENT: marked pallor left conjunctiva.  CAROTID UPSTROKE: normal.  JVD: flat.  HEART: Irregularly irregular rate and rhythm, normal S1S2, no murmur, no rub, no gallop, or click.  HEART MURMUR: none.  LUNGS: clear to auscultation, no wheezing/rhonchi/rales.Faint expiratory wheezing.  ABDOMEN: soft, non-tender, no hepatomegaly, no masses palpated. Ecchymosis around entire lower abdomen. No obvious hematoma.  EXTREMITIES: no leg edema. There are scattered ecchymoses on both arms. Marland Kitchen  PERIPHERAL PULSES: 2+, bilateral.  NEUROLOGIC: grossly intact except gait is wide based and guarded.Marland Kitchen  MOOD: normal anxious.        Assessment:     Assessment:  1. Pallor - 782.61 (Primary), suspect anemia. CONFIRMED, with hgb 5.9. ? internal bleeding related to trauma from fall vs GIB  2. Coronary atherosclerosis of native coronary artery - 414.01, angina due to anemia.  3. Anticoagulant monitoring - V58.61, ecchymoses around abdomen due to recent fall. Concerned about possible internal bleeding. Had this concern earlier in the year following another fall but CT showed no spleen rupture or internal bleeding.  4. Atrial fibrillation - 427.31, slighly faster than usual  5. Dyspnea - 786.09, COPD, CHF, and ? component related to anemia.  6. Chronic diastolic heart failure - 123XX123, ? decompenstaed due to anemia inducedl stress.  7. Hypertension, essential - 401.1, stable.  8. Acute renal failure - 584.9    Plan:     1. Pallor Stop Warfarin Sodium Tablet, 5MG , per pharmD, Orally, 5 mg qd except 7.5 mg M .  Admit. R/O trauma induced blood loss Stool fo occult blood GI consult if no bleeding noted on CT Chest , abdomen , and hip CT to r/o hematoma/spleen rupture etc.       2. Coronary atherosclerosis of native coronary artery Continue Atorvastatin Calcium Tablet, 80 MG, 1 tablet,  Orally, Once a day ; Continue Nitroglycerin 0.4 mg tablet, 0.4 mg, 1 tablet as directed, SL, as directed prn chest pain ; Continue CoQ-10 Capsule, 50 MG, 1 capsule with a meal, Orally, Once a day ; Continue Omega 3 Capsule, 1000 MG, 4 CAPSULES, Orally, DAILY ; Stop Aspir-81 Tablet Delayed Release, 81 MG, 1 tablet, Orally, Once a day .  LAB: CBC with Diff    WBC 12.5 4.0-11.0 - K/ul H   RBC 2.21 4.20-5.80 - M/uL L   HGB 5.9 13.0-17.0 - g/dL LL   HCT 19.3 39.0-52.0 - % L   MCH 26.8 27.0-33.0 - pg L   MPV 8.3 7.5-10.7 - fL    MCV 87.3 80.0-94.0 - fL    MCHC 30.7 32.0-36.0 - g/dL L   RDW 16.4 11.5-15.5 - % H   NRBC# 0.02 -    PLT 237 150-400 - K/uL    NEUT % 75.3 43.3-71.9 - % H   NRBC% 0.10 - %    LYMPH% 10.6 16.8-43.5 - % L   MONO % 13.6  4.6-12.4 - % H   EOS % 0.1 0.0-7.8 - %    BASO % 0.4 0.0-1.0 - %    NEUT # 9.4 1.9-7.2 - K/uL H   LYMPH# 1.30 1.10-2.70 - K/uL    MONO # 1.7 0.3-0.8 - K/uL H   EOS # 0.0 0.0-0.6 - K/uL    BASO # 0.1 0.0-0.1 - K/uL     LAB: Troponin I [id number]) Diagnostic Imaging:EKG A fiib with IVCD, Boehler,Eileen 12/25/2011 03:11:25 PM > [name] 12/25/2011 03:17:23 PM >       3. Anticoagulant monitoring Stop Warfarin Sodium Tablet, 5MG , per pharmD, Orally, 5 mg qd except 7.5 mg M .  LAB: PT/INR in Office LAB: PT (Prothrombin Time) [id number]) May need to perrmanently stop coumadin due to increasing frailty and falls.       4. Atrial fibrillation Continue Verapamil HCl CR Capsule Extended Release 24 Hour, 240MG , TAKE 1 CAPSULE EVERY MORNING ONCE A DAY .       5. Dyspnea  LAB: BNP    B-NATRIURETIC PEPTIDE 390 0-100 - pg/mL H      6. Acute renal failure  LAB: Basic Metabolic    GLUCOSE 0000000  123456 - mg/dL H   BUN 42  6-26 - mg/dL H   CREATININE 1.09  0.60-1.30 - mg/dl    eGFR (NON-AFRICAN AMERICAN) 65 >60 - calc    eGFR (AFRICAN AMERICAN) 79 >60 - calc    SODIUM 134  136-145 - mmol/L L   POTASSIUM 4.7  3.5-5.5 - mmol/L    CHLORIDE 101  98-107 - mmol/L     C02 25  22-32 - mg/dL    ANION GAP 12.8 6.0-20.0 - mmol/L    CALCIUM 9.1  8.6-10.3 - mg/dL       7. Others Continue Hydrocodone-Acetaminophen Tablet, 5-325 MG, 1 tablet as needed for pain, Orally, as directed ; Continue Astepro Solution, 0.15 %, as directed, Nasally ; Continue Zyrtec Allergy, 10 mg, 1 tablet, once daily ; Continue Spiriva HandiHaler, 18 mcg, once, daily ; Continue ProAir HFA Aerosol Solution, 108 (90 Base) MCG/ACT, 2 puffs as needed, Inhalation, as directed ; Continue Wellbutrin XL Tablet Extended Release 24 Hour, 300 MG, 1 tablet every morning, Orally, Once a day ; Continue Lexapro Tablet, 30 MG, 1 tablet, Orally, Once a day .        Immunizations:        Labs:        Procedure Codes: 93000 EKG I AND R, 85025 ECL CBC PLATELET DIFF, 83880 ECL B NP, 80048 ECL BMP, [id number] BLOOD COLLECTION ROUTINE VENIPUNCTURE       Preventive:         Follow Up: prn (Reason: after hospital discharge)      Provider: Daneen Schick, MD  Patient: Ab, Ellerbe DOB: 07/21/1930 Date: 12/25/2011

## 2011-12-25 NOTE — Progress Notes (Signed)
PHARMACIST - PHYSICIAN ORDER COMMUNICATION  CONCERNING: P&T Medication Policy on Herbal Medications  DESCRIPTION:  This patient's order for:  Coenzyme Q-10  has been noted.  This product(s) is classified as an "herbal" or natural product. Due to a lack of definitive safety studies or FDA approval, nonstandard manufacturing practices, plus the potential risk of unknown drug-drug interactions while on inpatient medications, the Pharmacy and Therapeutics Committee does not permit the use of "herbal" or natural products of this type within [facility].   ACTION TAKEN: The pharmacy department is unable to verify this order at this time and your patient has been informed of this safety policy. Please reevaluate patient's clinical condition at discharge and address if the herbal or natural product(s) should be resumed at that time.   Doreene Eland, PharmD, BCPS.  Pager: [phone] 12/25/2011 7:00 PM

## 2011-12-26 ENCOUNTER — Encounter (HOSPITAL_COMMUNITY): Payer: Self-pay | Admitting: Radiology

## 2011-12-26 ENCOUNTER — Inpatient Hospital Stay (HOSPITAL_COMMUNITY): Payer: Medicare Other

## 2011-12-26 ENCOUNTER — Telehealth: Payer: Self-pay | Admitting: *Deleted

## 2011-12-26 DIAGNOSIS — D649 Anemia, unspecified: Secondary | ICD-10-CM

## 2011-12-26 DIAGNOSIS — R609 Edema, unspecified: Secondary | ICD-10-CM

## 2011-12-26 DIAGNOSIS — J309 Allergic rhinitis, unspecified: Secondary | ICD-10-CM

## 2011-12-26 DIAGNOSIS — J811 Chronic pulmonary edema: Secondary | ICD-10-CM | POA: Insufficient documentation

## 2011-12-26 DIAGNOSIS — J449 Chronic obstructive pulmonary disease, unspecified: Secondary | ICD-10-CM

## 2011-12-26 LAB — FOLATE RBC: RBC Folate: 1284 ng/mL — ABNORMAL HIGH (ref >=366)

## 2011-12-26 LAB — COMPREHENSIVE METABOLIC PANEL
Albumin: 3.4 g/dL — ABNORMAL LOW (ref 3.5–5.2)
Alkaline Phosphatase: 131 U/L — ABNORMAL HIGH (ref 39–117)
BUN: 33 mg/dL — ABNORMAL HIGH (ref 6–23)
CO2: 23 mEq/L (ref 19–32)
Chloride: 99 mEq/L (ref 96–112)
Creatinine, Ser: 0.91 mg/dL (ref 0.50–1.35)
GFR calc non Af Amer: 79 mL/min — ABNORMAL LOW (ref >90)
Potassium: 3.6 mEq/L (ref 3.5–5.1)
Total Bilirubin: 2.2 mg/dL — ABNORMAL HIGH (ref 0.3–1.2)

## 2011-12-26 LAB — CBC
HCT: 24.4 % — ABNORMAL LOW (ref 39.0–52.0)
Hemoglobin: 8.1 g/dL — ABNORMAL LOW (ref 13.0–17.0)
MCV: 84.7 fL (ref 78.0–100.0)
RBC: 2.88 MIL/uL — ABNORMAL LOW (ref 4.22–5.81)
RDW: 15.7 % — ABNORMAL HIGH (ref 11.5–15.5)
WBC: 10.8 10*3/uL — ABNORMAL HIGH (ref 4.0–10.5)

## 2011-12-26 LAB — TSH: TSH: 1.301 u[IU]/mL (ref 0.350–4.500)

## 2011-12-26 LAB — PREPARE RBC (CROSSMATCH)

## 2011-12-26 LAB — PROTIME-INR: INR: 2.88 — ABNORMAL HIGH (ref 0.00–1.49)

## 2011-12-26 LAB — IRON AND TIBC
Saturation Ratios: 6 % — ABNORMAL LOW (ref 20–55)
TIBC: 385 ug/dL (ref 215–435)
UIBC: 362 ug/dL (ref 125–400)

## 2011-12-26 MED ORDER — ALBUTEROL SULFATE (5 MG/ML) 0.5% IN NEBU
INHALATION_SOLUTION | RESPIRATORY_TRACT | Status: AC
  Filled 2011-12-26: qty 0.5

## 2011-12-26 MED ORDER — BUDESONIDE 0.25 MG/2ML IN SUSP
0.2500 mg | Freq: Four times a day (QID) | RESPIRATORY_TRACT | Status: DC
  Administered 2011-12-26 – 2011-12-27 (×3): 0.25 mg via RESPIRATORY_TRACT
  Filled 2011-12-26 (×7): qty 2

## 2011-12-26 MED ORDER — ALBUTEROL SULFATE HFA 108 (90 BASE) MCG/ACT IN AERS
2.0000 | INHALATION_SPRAY | Freq: Two times a day (BID) | RESPIRATORY_TRACT | Status: DC
  Administered 2011-12-26: 2 via RESPIRATORY_TRACT

## 2011-12-26 MED ORDER — ALBUTEROL SULFATE (5 MG/ML) 0.5% IN NEBU
2.5000 mg | INHALATION_SOLUTION | Freq: Four times a day (QID) | RESPIRATORY_TRACT | Status: DC
  Administered 2011-12-26 – 2011-12-28 (×8): 2.5 mg via RESPIRATORY_TRACT
  Filled 2011-12-26 (×8): qty 0.5

## 2011-12-26 MED ORDER — FUROSEMIDE 10 MG/ML IJ SOLN
40.0000 mg | Freq: Once | INTRAMUSCULAR | Status: AC
  Administered 2011-12-26: 40 mg via INTRAVENOUS
  Filled 2011-12-26: qty 4

## 2011-12-26 MED ORDER — SALINE SPRAY 0.65 % NA SOLN
1.0000 | NASAL | Status: DC | PRN
  Administered 2011-12-27: 1 via NASAL
  Filled 2011-12-26: qty 44

## 2011-12-26 MED ORDER — ALBUTEROL SULFATE HFA 108 (90 BASE) MCG/ACT IN AERS
2.0000 | INHALATION_SPRAY | RESPIRATORY_TRACT | Status: DC | PRN
  Administered 2011-12-26: 2 via RESPIRATORY_TRACT
  Filled 2011-12-26: qty 6.7

## 2011-12-26 MED ORDER — AZELASTINE HCL 0.1 % NA SOLN
2.0000 | Freq: Two times a day (BID) | NASAL | Status: DC
  Administered 2011-12-26 – 2011-12-28 (×4): 2 via NASAL
  Filled 2011-12-26: qty 30

## 2011-12-26 MED ORDER — BUDESONIDE 0.25 MG/2ML IN SUSP
0.2500 mg | Freq: Four times a day (QID) | RESPIRATORY_TRACT | Status: DC
  Filled 2011-12-26 (×3): qty 2

## 2011-12-26 NOTE — Care Management Note (Signed)
    Page 1 of 2   12/28/2011     2:18:08 PM   CARE MANAGEMENT NOTE 12/28/2011  Patient:  Eduardo [name], Eduardo [name]   Account Number:  [id number]  Date Initiated:  12/26/2011  Documentation initiated by:  Zubayr Bednarczyk  Subjective/Objective Assessment:   PT ADM WITH S/P FALL WITH DYSPNEA AND HGB <6.0.  PTA, PT INDEPENDENT, LIVES WITH SPOUSE.  HE STATES HE IS CURRENTLY GOING TO OUTPATIENT P.T. AT [facility].     Action/Plan:   MET WITH PT TO DISCUSS DC PLANS.  PT STATES WIFE TO PROVIDE CARE AT HOME.  HE HAS A RW AND A CANE AT HOME, IF NEEDED. WILL FOLLOW FOR HOME NEEDS AS PT PROGRESSES.   Anticipated DC Date:  12/29/2011   Anticipated DC Plan:  [place]  CM consult      [facility] Choice  HOME HEALTH   Choice offered to / List presented to:  C-1 Patient         arranged  HH-1 RN  [place] [facility]      [place].   Status of service:  Completed, signed off Medicare Important Message given?   (If response is "NO", the following Medicare IM given date fields will be blank) Date Medicare IM given:   Date Additional Medicare IM given:    Discharge Disposition:  [place]  Per UR Regulation:  Reviewed for med. necessity/level of care/duration of stay  If discussed at [place] of Stay Meetings, dates discussed:    Comments:  12/28/11 Davionna Blacksher,RN,BSN [place].  PT [place]; PREFERS [facility].  START OF CARE 24-48H POST DC DATE.  WIFE AT BEDSIDE; PT AWAITING PHYSICAL THERAPY CONSULT PRIOR TO DC HOME.  START OF CARE 24-48H POST DC DATE.

## 2011-12-26 NOTE — Progress Notes (Signed)
Pt temp 99.6 axillary while getting blood, MD notified, gave order to give 2 tylenol prn, order carried out. Will continue to monitor.

## 2011-12-26 NOTE — Progress Notes (Signed)
Patient Name: Eduardo [name] Date of Encounter: 12/26/2011    SUBJECTIVE: Dyspnea better and no recurrence of chest pain since admission. Frequent urination. No dizziness or syncope.  TELEMETRY:  A fib with slowing VR as transfusion has improved O2 carrying capacity.: Filed Vitals:   12/26/11 0212 12/26/11 0240 12/26/11 0612 12/26/11 0717  BP: 141/76 132/81 148/84   Pulse: 98 95 81   Temp: 99 F (37.2 C) 99.1 F (37.3 C) 98.2 F (36.8 C)   TempSrc: Oral Oral Oral   Resp: 18 18 19    Height:      Weight:   77.1 kg (169 lb 15.6 oz)   SpO2: 93% 92% 92% 97%    Intake/Output Summary (Last 24 hours) at 12/26/11 1033 Last data filed at 12/25/11 2350  Gross per 24 hour  Intake  412.5 ml  Output      0 ml  Net  412.5 ml    LABS: Basic Metabolic Panel:  Basename 12/26/11 0555 12/25/11 1853  NA 136 --  K 3.6 --  CL 99 --  CO2 23 --  GLUCOSE 91 --  BUN 33* --  CREATININE 0.91 --  CALCIUM 8.9 --  MG -- 2.2  PHOS -- --   CBC:  Basename 12/26/11 0555  WBC 10.8*  NEUTROABS --  HGB 8.1*  HCT 24.4*  MCV 84.7  PLT 246   Cardiac Enzymes:  Basename 12/25/11 1852  CKTOTAL 811*  CKMB 19.6*  CKMBINDEX --  TROPONINI 0.73*   BNP    Component Value Date/Time   PROBNP 748.0* 06/19/2010 0330   Hemoglobin A1C:  Basename 12/25/11 1853  HGBA1C 5.8*   Radiology/Studies:  CT studies show no retroperitoneal or free intrathoracic or intraperitoneal bleeding.  Physical Exam: Blood pressure 148/84, pulse 81, temperature 98.2 F (36.8 C), temperature source Oral, resp. rate 19, height 5\' 3"  (1.6 m), weight 77.1 kg (169 lb 15.6 oz), SpO2 97.00%. Weight change:    Large lower abdominal and, flank, and back ecchymoses  Chest is clear to auscultation anteriorly but faint lower lobe/basilar rales.  No murmur but irregularly irregular rhythm.  No edema  ASSESSMENT:  1. Acute on chronic blood loss anemia with evidence of iron deficiency  2. Acute on chronic diastolic  heart failure induced by stress of anemia.  3. NSTEMI type 2 due to low oxygen carrying capacity due to anemia  4. Acute on chronic respiratory failure due to COPD , anemia, and diastolic heart failure  5. Acute on chronic kidney failure wih azotemia due to GIB  6. Coagulopathy due to coumadin  7. Suspect GIB due to coagulopathy    Plan:  1. D/C coumadin 2. Blood transfusion 3. Diuresis 4. Consider pulmonary consult 5. LA nitrates if recurrent chest pain., 6. Guarded condition due to multiple comorbidities  Signed, Sinclair Grooms 12/26/2011, 10:33 AM

## 2011-12-26 NOTE — Consult Note (Signed)
Name: Eduardo [name] MRN: [id number] DOB: [birth year]    LOS: 1  Referring Provider:  Pernell Dupre Reason for Referral:  Help with bronchodilators  PULMONARY / CRITICAL CARE MEDICINE  HPI: 78 yo pulmonary pt of Dr. Lamonte Sakai who was admitted on 7/16 with anemia(on coumadin) without known source of blood loss. Tx with transfusion. Noted to have increased SOB and wheezing and PCCM asked to evaluate.  Past Medical History  Diagnosis Date  . Anemia   . Anxiety   . Atrial fibrillation   . CAD (coronary artery disease)   . Hyperlipidemia   . COPD (chronic obstructive pulmonary disease)   . Allergy   . Osteoarthritis   . Hx of colonic polyps   . Hyperplastic polyp of intestine 2001  . Adenomatous polyp 2001  . Hypertension 12/25/11    pt denies this history  . Myocardial infarction 06/2010    "twice; 1 day apart; during/after cath"  . Exertional dyspnea   . S/P appy    Past Surgical History  Procedure Date  . Appendectomy   . Carpal tunnel release     left  . Mastoidectomy     x3 on right  . Pilonidal cyst / sinus excision   . Knee arthroscopy     left, right knee 10/2009  . Shoulder arthroscopy     right  . Foot fracture surgery     right heel repair  . Nasal septum surgery     nasal septal repair  . Penile prosthesis implant     x 2--got infection after 2nd and had to remove  . Cataract extraction w/ intraocular lens implant 2/13    Dr [name]  . Carpal tunnel release 10/11/11    Dr Rush Barer  . Tonsillectomy and adenoidectomy   . Fracture surgery   . Cardiac catheterization 2012    50% LAD and luminal irreg in RCA, 1/12  Post cath MI from damage  . Penile prosthesis  removal    Prior to Admission medications   Medication Sig Start Date End Date Taking? Authorizing Provider  albuterol (PROVENTIL HFA;VENTOLIN HFA) 108 (90 BASE) MCG/ACT inhaler Inhale 2 puffs into the lungs every 6 (six) hours as needed. For shortness of breath   Yes Historical Provider, MD   aspirin 81 MG tablet Take 81 mg by mouth daily.     Yes Historical Provider, MD  atorvastatin (LIPITOR) 80 MG tablet Take 80 mg by mouth at bedtime.   Yes Historical Provider, MD  Azelastine HCl (ASTEPRO) 0.15 % SOLN 2 sprays each nostril twice a day 04/13/11  Yes Collene Gobble, MD  buPROPion (WELLBUTRIN XL) 300 MG 24 hr tablet Take 1 tablet by mouth daily  08/03/10  Yes Historical Provider, MD  cetirizine (ZYRTEC) 10 MG tablet Take 10 mg by mouth daily.   Yes Historical Provider, MD  Coenzyme Q10 (CO Q-10) 50 MG CAPS Take 50 mg by mouth daily.   Yes Historical Provider, MD  escitalopram (LEXAPRO) 20 MG tablet Take 30 mg by mouth daily.   Yes Historical Provider, MD  HYDROcodone-acetaminophen (NORCO) 5-325 MG per tablet Take 1-2 tablets by mouth every 6 (six) hours as needed. 12/20/11  Yes Venia Carbon, MD  omega-3 acid ethyl esters (LOVAZA) 1 G capsule Take 1 g by mouth 4 (four) times daily.   Yes Historical Provider, MD  tiotropium (SPIRIVA) 18 MCG inhalation capsule Place 36 mcg into inhaler and inhale daily.   Yes Historical Provider, MD  verapamil (VERELAN PM)  240 MG 24 hr capsule Take 1 tablet by mouth daily  09/08/10  Yes Historical Provider, MD   Allergies Allergies  Allergen Reactions  . Penicillins Itching  . Sulfonamide Derivatives Other (See Comments)    "it affected my kidneys"    Family History Family History  Problem Relation Age of Onset  . Diabetes Neg Hx   . Hypertension Neg Hx   . Heart attack Father   . Cancer Mother     ? leukemia   Social History  reports that he quit smoking about 33 years ago. His smoking use included Cigarettes. He has a 75 pack-year smoking history. He has never used smokeless tobacco. He reports that he drinks alcohol. He reports that he does not use illicit drugs.  Review Of Systems:  Taken extensively see hpi.  Brief patient description:  78 yo wm nad at rest  Events Since Admission: 7/27 transfusion  Current Status: NAD Vital  Signs: Temp:  [98.2 F (36.8 C)-99.6 F (37.6 C)] 98.2 F (36.8 C) (07/17 0612) Pulse Rate:  [81-112] 81  (07/17 0612) Resp:  [18-20] 19  (07/17 0612) BP: (132-149)/(76-84) 148/84 mmHg (07/17 0612) SpO2:  [92 %-97 %] 96 % (07/17 1401) Weight:  [169 lb 15.6 oz (77.1 kg)-170 lb 12.8 oz (77.474 kg)] 169 lb 15.6 oz (77.1 kg) (07/17 0612)  Physical Examination: General:  WNWDWM NAD Neuro:  intact HEENT:  No adenopathy Neck:  nno jvd Cardiovascular:  hsd Lungs:  Forced exp wheeze Abdomen:  Soft + bs Musculoskeletal:  intact Skin:  Warm, no edema  Principal Problem:  *Anemia Ct Abdomen Pelvis Wo Contrast  12/26/2011  *RADIOLOGY REPORT*  Clinical Data:  Anemia post trauma.  CT CHEST, ABDOMEN AND PELVIS WITHOUT CONTRAST  Technique:  Multidetector CT imaging of the chest, abdomen and pelvis was performed following the standard protocol without IV contrast.  Comparison:  CT abdomen and pelvis 11/13/2011.  CT CHEST  Findings:  Cardiac enlargement.  Calcification in the coronary arteries, mitral, and aortic valve regions.  Calcification of thoracic aorta without aneurysm.  No significant mediastinal fluid collection.  Scattered lymph nodes in the axilla and mediastinum are not pathologically enlarged.  The esophagus is decompressed. Atelectasis in the lung bases.  Scattered fibrosis and emphysematous changes in the lungs.  No pleural effusion or pneumothorax.  Airways appear patent.  Degenerative changes in the thoracic spine.  No vertebral compression or displacement.  No sternal depression.  Healing left posterior rib fractures.  IMPRESSION: Healing left rib fractures.  No evidence of mediastinal or chest hematoma.  CT ABDOMEN AND PELVIS  Findings:  There is an acute appearing hematoma in the subcutaneous soft tissues superficial to the left posterior iliac region.  This measures about 9.882 x 4.7 by 7.1 cm.  This is new since the previous study.  There is infiltration throughout the subcutaneous fat  in the left abdominal wall and extending down the gluteal region.  This also may represent contusion or hemorrhage.  The unenhanced appearance of the liver, spleen, gallbladder, pancreas, adrenal glands, and retroperitoneal lymph nodes is unremarkable.  The kidneys demonstrate multiple exophytic lesions likely representing cysts.  Calcification of the abdominal aorta without aneurysm.  The stomach, small bowel, and colon are not abnormally distended.  No free air or free fluid in the abdomen. No mesenteric or retroperitoneal fluid collections.  Pelvis:  The prostate gland is enlarged.  The bladder wall is not thickened.  No free or loculated pelvic fluid collections.  No significant  pelvic lymphadenopathy.  Degenerative changes in the lumbar spine.  No displaced fractures demonstrated in the pelvis, sacrum, or proximal hips.  IMPRESSION: Interval development of hematoma in the subcutaneous fat superficial to the superior aspect of the posterior left iliac bone.  Original Report Authenticated By: Neale Burly, M.D.   Ct Chest Wo Contrast  12/26/2011  *RADIOLOGY REPORT*  Clinical Data:  Anemia post trauma.  CT CHEST, ABDOMEN AND PELVIS WITHOUT CONTRAST  Technique:  Multidetector CT imaging of the chest, abdomen and pelvis was performed following the standard protocol without IV contrast.  Comparison:  CT abdomen and pelvis 11/13/2011.  CT CHEST  Findings:  Cardiac enlargement.  Calcification in the coronary arteries, mitral, and aortic valve regions.  Calcification of thoracic aorta without aneurysm.  No significant mediastinal fluid collection.  Scattered lymph nodes in the axilla and mediastinum are not pathologically enlarged.  The esophagus is decompressed. Atelectasis in the lung bases.  Scattered fibrosis and emphysematous changes in the lungs.  No pleural effusion or pneumothorax.  Airways appear patent.  Degenerative changes in the thoracic spine.  No vertebral compression or displacement.  No sternal  depression.  Healing left posterior rib fractures.  IMPRESSION: Healing left rib fractures.  No evidence of mediastinal or chest hematoma.  CT ABDOMEN AND PELVIS  Findings:  There is an acute appearing hematoma in the subcutaneous soft tissues superficial to the left posterior iliac region.  This measures about 9.882 x 4.7 by 7.1 cm.  This is new since the previous study.  There is infiltration throughout the subcutaneous fat in the left abdominal wall and extending down the gluteal region.  This also may represent contusion or hemorrhage.  The unenhanced appearance of the liver, spleen, gallbladder, pancreas, adrenal glands, and retroperitoneal lymph nodes is unremarkable.  The kidneys demonstrate multiple exophytic lesions likely representing cysts.  Calcification of the abdominal aorta without aneurysm.  The stomach, small bowel, and colon are not abnormally distended.  No free air or free fluid in the abdomen. No mesenteric or retroperitoneal fluid collections.  Pelvis:  The prostate gland is enlarged.  The bladder wall is not thickened.  No free or loculated pelvic fluid collections.  No significant pelvic lymphadenopathy.  Degenerative changes in the lumbar spine.  No displaced fractures demonstrated in the pelvis, sacrum, or proximal hips.  IMPRESSION: Interval development of hematoma in the subcutaneous fat superficial to the superior aspect of the posterior left iliac bone.  Original Report Authenticated By: Neale Burly, M.D.     ASSESSMENT AND PLAN  PULMONARY No results found for this basename: PHART:5,PCO2:5,PCO2ART:5,PO2ART:5,HCO3:5,O2SAT:5 in the last 168 hours Ventilator Settings:    A:  COPD/ P:   See orders for BD  CARDIOVASCULAR  Lab 12/25/11 1852  TROPONINI 0.73*  LATICACIDVEN --  PROBNP --      A: Afib/cad P:  Per cards  Blood loss anemia. Note Ct with hematoma left illiac P. -per cards, stop coumadin, transfuse as needed  Richardson Landry Minor ACNP Maryanna Shape  PCCM Pager [phone] till 3 pm If no answer page [phone] 12/26/2011, 2:02 PM  Patient has end exp wheezes.  He is clearly in some respiratory distress.  Will need active diureses and restart bronchodilator as above.  Transfusion would also be helpful.  Will continue to follow with you.  Patient seen and examined, agree with above note.  I dictated the care and orders written for this patient under my direction.  Jennet Maduro, M.D. [phone]

## 2011-12-26 NOTE — Telephone Encounter (Signed)
Eduardo [name] took message from spouse that pt was admitted for low hgb per his cardiologist, note in chart.  Wife called wanting to speak with Dr.Letvak, .left message to have patient's wife return my call.

## 2011-12-27 ENCOUNTER — Ambulatory Visit: Payer: 59 | Admitting: Emergency Medicine

## 2011-12-27 DIAGNOSIS — S7000XA Contusion of unspecified hip, initial encounter: Secondary | ICD-10-CM

## 2011-12-27 LAB — CBC
HCT: 24.7 % — ABNORMAL LOW (ref 39.0–52.0)
Hemoglobin: 8.1 g/dL — ABNORMAL LOW (ref 13.0–17.0)
MCH: 28 pg (ref 26.0–34.0)
RBC: 2.89 MIL/uL — ABNORMAL LOW (ref 4.22–5.81)

## 2011-12-27 LAB — BASIC METABOLIC PANEL
CO2: 25 mEq/L (ref 19–32)
Calcium: 9.1 mg/dL (ref 8.4–10.5)
Chloride: 99 mEq/L (ref 96–112)
Creatinine, Ser: 0.95 mg/dL (ref 0.50–1.35)
GFR calc non Af Amer: 78 mL/min — ABNORMAL LOW (ref >90)
Glucose, Bld: 109 mg/dL — ABNORMAL HIGH (ref 70–99)
Potassium: 3.6 mEq/L (ref 3.5–5.1)

## 2011-12-27 LAB — PREPARE RBC (CROSSMATCH)

## 2011-12-27 LAB — PROTIME-INR: Prothrombin Time: 24.8 seconds — ABNORMAL HIGH (ref 11.6–15.2)

## 2011-12-27 MED ORDER — DOCUSATE SODIUM 100 MG PO CAPS
100.0000 mg | ORAL_CAPSULE | Freq: Every day | ORAL | Status: DC
  Administered 2011-12-27 – 2011-12-28 (×2): 100 mg via ORAL
  Filled 2011-12-27 (×2): qty 1

## 2011-12-27 MED ORDER — NON FORMULARY: 10.0000 mg | Freq: Every day | Status: DC

## 2011-12-27 MED ORDER — CETIRIZINE HCL 10 MG PO TABS
10.0000 mg | ORAL_TABLET | Freq: Every day | ORAL | Status: DC
  Administered 2011-12-28: 10 mg via ORAL
  Filled 2011-12-27 (×2): qty 1

## 2011-12-27 MED ORDER — POTASSIUM CHLORIDE CRYS ER 20 MEQ PO TBCR
20.0000 meq | EXTENDED_RELEASE_TABLET | Freq: Once | ORAL | Status: AC
  Administered 2011-12-27: 20 meq via ORAL
  Filled 2011-12-27: qty 1

## 2011-12-27 MED ORDER — TOBRAMYCIN 0.3 % OP SOLN
1.0000 [drp] | Freq: Two times a day (BID) | OPHTHALMIC | Status: DC
  Administered 2011-12-27 – 2011-12-28 (×3): 1 [drp] via OPHTHALMIC
  Filled 2011-12-27 (×2): qty 5

## 2011-12-27 MED ORDER — FERROUS SULFATE 325 (65 FE) MG PO TABS
325.0000 mg | ORAL_TABLET | Freq: Two times a day (BID) | ORAL | Status: DC
  Administered 2011-12-27 – 2011-12-28 (×2): 325 mg via ORAL
  Filled 2011-12-27 (×4): qty 1

## 2011-12-27 MED ORDER — FUROSEMIDE 10 MG/ML IJ SOLN
40.0000 mg | Freq: Once | INTRAMUSCULAR | Status: AC
  Administered 2011-12-27: 40 mg via INTRAVENOUS
  Filled 2011-12-27: qty 4

## 2011-12-27 MED ORDER — FUROSEMIDE 10 MG/ML IJ SOLN
40.0000 mg | Freq: Two times a day (BID) | INTRAMUSCULAR | Status: AC
  Administered 2011-12-27 (×2): 40 mg via INTRAVENOUS
  Filled 2011-12-27 (×3): qty 4

## 2011-12-27 NOTE — Telephone Encounter (Signed)
I did speak to her today

## 2011-12-27 NOTE — Progress Notes (Signed)
Patient Name: Eduardo [name] Date of Encounter: 12/27/2011    SUBJECTIVE: He developed respiratory distress mid to early afternoon on yesterday. He was seen by critical care medicine. Bronchodilator therapy was adjusted. We diuresed him. Respiratory status is better today. He has not had chest pain. Stool is negative for blood.  TELEMETRY:  Atrial fibrillation with increasing heart rate.: Filed Vitals:   12/26/11 2228 12/27/11 0109 12/27/11 0440 12/27/11 0539  BP:    143/80  Pulse:   98   Temp:   99 F (37.2 C)   TempSrc:   Oral   Resp:   22   Height:      Weight:    77 kg (169 lb 12.1 oz)  SpO2: 94% 95% 98%     Intake/Output Summary (Last 24 hours) at 12/27/11 0745 Last data filed at 12/26/11 1727  Gross per 24 hour  Intake    240 ml  Output    525 ml  Net   -285 ml    LABS: Basic Metabolic Panel:  Basename 12/27/11 0500 12/26/11 0555 12/25/11 1853  NA 137 136 --  K 3.6 3.6 --  CL 99 99 --  CO2 25 23 --  GLUCOSE 109* 91 --  BUN 31* 33* --  CREATININE 0.95 0.91 --  CALCIUM 9.1 8.9 --  MG -- -- 2.2  PHOS -- -- --   CBC:  Basename 12/27/11 0500 12/26/11 0555  WBC 12.9* 10.8*  NEUTROABS -- --  HGB 8.1* 8.1*  HCT 24.7* 24.4*  MCV 85.5 84.7  PLT 245 246   Cardiac Enzymes:  Basename 12/25/11 1852  CKTOTAL 811*  CKMB 19.6*  CKMBINDEX --  TROPONINI 0.73*   BNP: No components found with this basename: POCBNP:3 Hemoglobin A1C:  Basename 12/25/11 1853  HGBA1C 5.8*   Iron/TIBC/Ferritin    Component Value Date/Time   IRON 23* 12/25/2011 1853   TIBC 385 12/25/2011 1853   FERRITIN 87 12/25/2011 1853   Radiology/Studies:  No new  Physical Exam: Blood pressure 143/80, pulse 98, temperature 99 F (37.2 C), temperature source Oral, resp. rate 22, height 5\' 3"  (1.6 m), weight 77 kg (169 lb 12.1 oz), SpO2 98.00%. Weight change: -0.474 kg (-1 lb 0.7 oz)   Still with some obvious respiratory effort but markedly improved  Right eye is red and has  drainage.  Mild JVD.  Rales are heard at the bases bilaterally.  Cardiac exam reveals a rhythm that is irregularly irregular  ASSESSMENT:  1. Severe anemia, acute on chronic. Apparent deficiency but without obvious evidence of GI blood loss.  2. Acute on chronic diastolic heart failure secondary to the stress of anemia  3. Type II non-ST elevation myocardial infarction due to the stress of anemia.  4. COPD with wheezing, improved on bronchodilator therapy.  5. Chronic renal insufficiency/chronic kidney disease, stable  6. Right eye conjunctivitis   Plan:  . Transfuse 2 units of blood today  2. Twice today starting before transfusion and in between the 2 units.  3. Treat conjunctivitis graft  4. Iron therapy  5. Strict I/O  Signed, Sinclair Grooms 12/27/2011, 7:45 AM

## 2011-12-27 NOTE — Telephone Encounter (Signed)
Patient admitted in hospital.  °

## 2011-12-27 NOTE — Progress Notes (Signed)
Per report, day shift RN stated MD wants to hold off on 1 unit of blood transfusion until he says it is ok to give.  Will continue to monitor pt.

## 2011-12-27 NOTE — Progress Notes (Signed)
Pt very short of breath, with expiratory wheezing, and fine crackles in lungs. MD on call notified, gave order to give lasix IV and potassium PO, orders carried out, will continue to monitor.

## 2011-12-27 NOTE — Progress Notes (Signed)
Name: Eduardo [name] MRN: [id number] DOB: 1931-03-12    LOS: 2  Referring Provider:  Pernell [name] Reason for Referral:  Dyspnea  Primary pulmonary: Eduardo [name]  PULMONARY / CRITICAL CARE MEDICINE  HPI: 78 yo pulmonary pt of Dr. Lamonte Sakai who was admitted on 7/16 with anemia(on coumadin) without known source of blood loss. Rx with transfusion. Noted to have increased SOB and wheezing and PCCM asked to evaluate.  Subjective/ Overnight:  SOB somewhat improved.    Vital Signs: Temp:  [98.7 F (37.1 C)-99.4 F (37.4 C)] 99 F (37.2 C) (07/18 0440) Pulse Rate:  [74-98] 98  (07/18 0440) Resp:  [20-22] 22  (07/18 0440) BP: (143)/(67-92) 143/80 mmHg (07/18 0539) SpO2:  [87 %-98 %] 98 % (07/18 0440) Weight:  [169 lb 12.1 oz (77 kg)] 169 lb 12.1 oz (77 kg) (07/18 0539) 2.5L [place]   Physical Examination: General:  Chronically ill appearing male, NAD  Neuro:  intact HEENT:  No jvd, no adenopathy Cardiovascular:  s1s2 irreg Lungs:  resps even mildly labored with activity, faint exp wheeze  Abdomen: mildly distended, extensive bruising to lower abd Musculoskeletal:  intact Skin:  Warm, no edema  Principal Problem:  *Anemia Active Problems:  ANXIETY  HYPERTENSION  CORONARY ARTERY DISEASE  Atrial fibrillation  COPD  DYSPNEA/SHORTNESS OF BREATH  Pulmonary edema   Lab 12/27/11 0500 12/26/11 0555  HGB 8.1* 8.1*  HCT 24.7* 24.4*  WBC 12.9* 10.8*  PLT 245 246    Lab 12/27/11 0500 12/26/11 0555 12/25/11 1853  NA 137 136 --  K 3.6 3.6 --  CL 99 99 --  CO2 25 23 --  GLUCOSE 109* 91 --  BUN 31* 33* --  CREATININE 0.95 0.91 --  CALCIUM 9.1 8.9 --  MG -- -- 2.2  PHOS -- -- --     Lab 12/25/11 1852  TROPONINI 0.73*   12/26/2011   CT CHEST  Findings:  Cardiac enlargement.  Calcification in the coronary arteries, mitral, and aortic valve regions.  Calcification of thoracic aorta without aneurysm.  No significant mediastinal fluid collection.  Scattered lymph nodes in the axilla and  mediastinum are not pathologically enlarged.  The esophagus is decompressed. Atelectasis in the lung bases.  Scattered fibrosis and emphysematous changes in the lungs.  No pleural effusion or pneumothorax.  Airways appear patent.  Degenerative changes in the thoracic spine.  No vertebral compression or displacement.  No sternal depression.  Healing left posterior rib fractures.   IMPRESSION: Healing left rib fractures.  No evidence of mediastinal or chest hematoma.    12/26/2011 CT ABDOMEN AND PELVIS  Findings:  There is an acute appearing hematoma in the subcutaneous soft tissues superficial to the left posterior iliac region.  This measures about 9.882 x 4.7 by 7.1 cm.  This is new since the previous study.  There is infiltration throughout the subcutaneous fat in the left abdominal wall and extending down the gluteal region.  This also may represent contusion or hemorrhage.  The unenhanced appearance of the liver, spleen, gallbladder, pancreas, adrenal glands, and retroperitoneal lymph nodes is unremarkable.  The kidneys demonstrate multiple exophytic lesions likely representing cysts.  Calcification of the abdominal aorta without aneurysm.  The stomach, small bowel, and colon are not abnormally distended.  No free air or free fluid in the abdomen. No mesenteric or retroperitoneal fluid collections.  Pelvis:  The prostate gland is enlarged.  The bladder wall is not thickened.  No free or loculated pelvic fluid collections.  No significant  pelvic lymphadenopathy.  Degenerative changes in the lumbar spine.  No displaced fractures demonstrated in the pelvis, sacrum, or proximal hips.   IMPRESSION: Interval development of hematoma in the subcutaneous fat superficial to the superior aspect of the posterior left iliac bone.    ASSESSMENT AND PLAN  COPD; mild by spirometry. His wheezing and sx have always been UA in nature, have responded to zyrtec, nasal saline washes, astepro nasal spray.  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  PLAN -  Defer decision re: ??when/if to restart anticoagulation to cards   Acute on chronic diastolic heart failure  PLAN -  Cont gentle diuresis per cards  May need additional dose lasix after blood   Anemia -  On coumadin.   Stool hemoccult neg.   Hematoma in sub-q fat superficial to the superior aspect of the posterior left iliac Bone. Has had multiple falls over the last month, initial Hgb reported as 5.0 (not in our system). Clearly an chronic and acute component here- the subcutaneous hematoma doesn't account for Hgb drop to 5. Fe panel shows evidence for Fe-deficiency PLAN -  Coumadin on hold  Transfusion today per cards  Start Iron replacement + colace Check hemolysis labs in the am Agree would consider Heme consult  Delirium, especially nocturnal- wife mentions this to me and is very concerned about it. I told her that this could reflect this acute illness (Hgb to 5), could relate to meds (he apparently takes most of his meds all at the same time before bedtime), ? An evolving dementia, etc.  - will need to work this up as they prepare to possibly go home; ? Can he go home safely, ? Does she need more help with him   [facility], NP 12/27/2011  8:41 AM Pager: [phone] or [phone]  *Care during the described time interval was provided by me and/or other providers on the critical care team. I have reviewed this patient's available data, including medical history, events of note, physical examination and test results as part of my  evaluation.   Attending Addendum:  I have seen the patient, discussed the issues, test results and plans with K. Whiteheart, NP. I agree with the Assessment and Plans as ammended above.   Baltazar Apo, MD, PhD 12/27/2011, 10:19 AM [facility] [phone] or if no answer [phone]

## 2011-12-28 ENCOUNTER — Inpatient Hospital Stay (HOSPITAL_COMMUNITY): Payer: Medicare Other

## 2011-12-28 ENCOUNTER — Ambulatory Visit: Payer: Medicare Other | Admitting: Internal Medicine

## 2011-12-28 DIAGNOSIS — Z7901 Long term (current) use of anticoagulants: Secondary | ICD-10-CM

## 2011-12-28 DIAGNOSIS — I5032 Chronic diastolic (congestive) heart failure: Secondary | ICD-10-CM | POA: Diagnosis present

## 2011-12-28 DIAGNOSIS — I252 Old myocardial infarction: Secondary | ICD-10-CM | POA: Diagnosis present

## 2011-12-28 DIAGNOSIS — R296 Repeated falls: Secondary | ICD-10-CM

## 2011-12-28 LAB — BASIC METABOLIC PANEL
BUN: 29 mg/dL — ABNORMAL HIGH (ref 6–23)
Calcium: 8.9 mg/dL (ref 8.4–10.5)
Creatinine, Ser: 0.9 mg/dL (ref 0.50–1.35)
GFR calc Af Amer: >90 mL/min (ref >90)
GFR calc non Af Amer: 79 mL/min — ABNORMAL LOW (ref >90)
Glucose, Bld: 99 mg/dL (ref 70–99)
Potassium: 3.2 mEq/L — ABNORMAL LOW (ref 3.5–5.1)

## 2011-12-28 LAB — TYPE AND SCREEN
ABO/RH(D): A POS
Antibody Screen: NEGATIVE
Unit division: 0
Unit division: 0

## 2011-12-28 LAB — CBC
HCT: 32.3 % — ABNORMAL LOW (ref 39.0–52.0)
Hemoglobin: 10.7 g/dL — ABNORMAL LOW (ref 13.0–17.0)
MCH: 28.5 pg (ref 26.0–34.0)
MCHC: 33.1 g/dL (ref 30.0–36.0)
RDW: 16.1 % — ABNORMAL HIGH (ref 11.5–15.5)

## 2011-12-28 MED ORDER — POTASSIUM CHLORIDE CRYS ER 20 MEQ PO TBCR
40.0000 meq | EXTENDED_RELEASE_TABLET | Freq: Three times a day (TID) | ORAL | Status: DC
  Administered 2011-12-28: 40 meq via ORAL
  Filled 2011-12-28: qty 2

## 2011-12-28 MED ORDER — OMEGA-3 FATTY ACIDS 1000 MG PO CAPS: 4.0000 g | ORAL_CAPSULE | Freq: Every day | ORAL | Status: DC

## 2011-12-28 MED ORDER — FERROUS SULFATE 325 (65 FE) MG PO TABS: 325.0000 mg | ORAL_TABLET | Freq: Two times a day (BID) | ORAL | Status: DC

## 2011-12-28 MED ORDER — TOBRAMYCIN 0.3 % OP SOLN: 1.0000 [drp] | Freq: Two times a day (BID) | OPHTHALMIC | Status: AC

## 2011-12-28 MED ORDER — TIOTROPIUM BROMIDE MONOHYDRATE 18 MCG IN CAPS: 18.0000 ug | ORAL_CAPSULE | Freq: Every day | RESPIRATORY_TRACT | Status: DC

## 2011-12-28 MED ORDER — ESCITALOPRAM OXALATE 20 MG PO TABS: 30.0000 mg | ORAL_TABLET | Freq: Every day | ORAL | Status: DC

## 2011-12-28 MED ORDER — NITROGLYCERIN 0.4 MG SL SUBL: 0.4000 mg | SUBLINGUAL_TABLET | SUBLINGUAL | Status: DC | PRN

## 2011-12-28 NOTE — Progress Notes (Signed)
Name: Eduardo [name] MRN: [id number] DOB: 11-Oct-1930    LOS: 3  Referring Provider:  Pernell Dupre Reason for Referral:  Dyspnea  Primary pulmonary: [place]  PULMONARY / CRITICAL CARE MEDICINE  HPI: 78 yo pulmonary pt of Dr. Lamonte Sakai who was admitted on 7/16 with anemia(on coumadin) without known source of blood loss. Rx with transfusion. Noted to have increased SOB and wheezing and PCCM asked to evaluate.  Subjective/ Overnight:  Breathing better.  Reports episode of restlessness while asleep.  States this happens at home also.  He reports feeling like he is acting out his dreams while asleep.  Also reports having shuffling/unsteady gait, and frequent falls recently.  Vital Signs: Temp:  [97.7 F (36.5 C)-98.7 F (37.1 C)] 97.9 F (36.6 C) (07/19 0442) Pulse Rate:  [85-112] 89  (07/19 0442) Resp:  [19-24] 19  (07/19 0442) BP: (129-158)/(69-87) 129/81 mmHg (07/19 0442) SpO2:  [94 %-99 %] 98 % (07/19 0442) Weight:  [169 lb 8.5 oz (76.9 kg)] 169 lb 8.5 oz (76.9 kg) (07/19 0442) 2.5L [place]   Physical Examination: General:  NAD Neuro:  CN intact HEENT:  No jvd, no adenopathy Cardiovascular:  s1s2 irreg Lungs:  Barrel chest, no wheeze Abdomen: soft, non tender Musculoskeletal:  intact Skin:  Warm, no edema  Principal Problem:  *Anemia Active Problems:  ANXIETY  HYPERTENSION  CORONARY ARTERY DISEASE  Atrial fibrillation  COPD  DYSPNEA/SHORTNESS OF BREATH  Pulmonary edema   Lab 12/28/11 0630 12/27/11 0500 12/26/11 0555  HGB 10.7* 8.1* 8.1*  HCT 32.3* 24.7* 24.4*  WBC 11.4* 12.9* 10.8*  PLT 249 245 246    Lab 12/28/11 0630 12/27/11 0500 12/26/11 0555 12/25/11 1853  NA 137 137 136 --  K 3.2* 3.6 -- --  CL 98 99 99 --  CO2 27 25 23  --  GLUCOSE 99 109* 91 --  BUN 29* 31* 33* --  CREATININE 0.90 0.95 0.91 --  CALCIUM 8.9 9.1 8.9 --  MG -- -- -- 2.2  PHOS -- -- -- --      Lab 12/25/11 1852  TROPONINI 0.73*   12/26/2011   CT CHEST  Findings:  Cardiac enlargement.   Calcification in the coronary arteries, mitral, and aortic valve regions.  Calcification of thoracic aorta without aneurysm.  No significant mediastinal fluid collection.  Scattered lymph nodes in the axilla and mediastinum are not pathologically enlarged.  The esophagus is decompressed. Atelectasis in the lung bases.  Scattered fibrosis and emphysematous changes in the lungs.  No pleural effusion or pneumothorax.  Airways appear patent.  Degenerative changes in the thoracic spine.  No vertebral compression or displacement.  No sternal depression.  Healing left posterior rib fractures.   IMPRESSION: Healing left rib fractures.  No evidence of mediastinal or chest hematoma.     ASSESSMENT AND PLAN  A: Mild COPD.  Mostly upper airway wheezing.  Improved. P: Continue spiriva and prn albuterol Continue astelin, zyrtec (loratadine has been ineffective) Titrate oxygen to keep SpO2 > 92%  A: Delirium.  From pt and wife description concern is for possible REM parasomnia.  Also may have subtle manifestations of Parkinson's disease. P: May need neurology evaluation>>defer to primary team whether this should be done as inpatient vs outpt   Respiratory status stable for d/c.  If he remains in hospital, then pulmonary service will follow up on 7/22.  If he is d/c home, then he should follow up with Dr. Lamonte Sakai in pulmonary office in 2 weeks after hospital d/c.  Plan of care d/c Dr. Tamala Julian.   Chesley Mires, MD [facility]Critical Care 12/28/2011, 8:56 AM Pager:  [phone] After 3pm call: [phone]

## 2011-12-28 NOTE — Evaluation (Signed)
Physical Therapy Evaluation Patient Details Name: Eduardo [name] MRN: [id number] DOB: 1931-02-24 Today's Date: 12/28/2011 Time: [id number] PT Time Calculation (min): 39 min  PT Assessment / Plan / Recommendation Clinical Impression  Pt adm with anemia s/p fall with large hematoma on his Rt flank. Pt endorses he's had numerous falls and PTA was undergoing OPPT for balance. Recommend HHPT for home safety eval (especially because pt/wife have concerns re: shower safety and ? need for a different seat).    PT Assessment  All further PT needs can be met in the next venue of care    Follow Up Recommendations  Home health PT;Supervision/Assistance - 24 hour    Barriers to Discharge        Equipment Recommendations  Rolling walker with 5" wheels    Recommendations for Other Services     Frequency      Precautions / Restrictions Precautions Precautions: Fall   Pertinent Vitals/Pain Denies pain      Mobility  Bed Mobility Bed Mobility: Not assessed Transfers Transfers: Sit to Stand;Stand to Sit Sit to Stand: 5: Supervision;With upper extremity assist;With armrests;From bed;From chair/3-in-1 (x 4) Stand to Sit: 5: Supervision;With upper extremity assist;With armrests;To chair/3-in-1 (x4) Details for Transfer Assistance: Pt required instructional cues for safe use of RW; with repetition able to demonstrate; required supervision due to unsteadiness Ambulation/Gait Ambulation/Gait Assistance: 4: Min guard Ambulation Distance (Feet): 150 Feet (150 over 4 different walks) Assistive device: Rolling walker Ambulation/Gait Assistance Details: Pt's RW way too tall for him (was borrowed). Fit him with correct height RW and instructed in safe use, including slowing down and staying closer to RW (especially with turns). Gait Pattern: Step-through pattern;Right steppage;Left steppage;Decreased stride length Stairs: No (vc and demonstrated how son/wife can assist him up steps)    Exercises      PT Diagnosis: Difficulty walking  PT Problem List: Decreased activity tolerance;Decreased balance;Decreased mobility;Decreased knowledge of use of DME PT Treatment Interventions:     PT Goals    Visit Information  Last PT Received On: 12/28/11 Assistance Needed: +1    Subjective Data  Subjective: Reports he first fell end of May, standing on sloped part of his yard and trying to bend over to pick up a tree branch. Then later fell going into bathroom "feet skittered" and recently fell going into kitchen (got off balance), hit his side on a baker's rack and then to the floor. Patient Stated Goal: to go home and continue PT for balance    Prior [place] Lives With: Spouse Available Help at Discharge: Family;Available 24 hours/day Type of Home: House Home Access: Stairs to enter [employer] of Steps: 2 Entrance Stairs-Rails: None Home Layout: Two level;Able to live on main level with bedroom/bathroom Bathroom Shower/Tub: Multimedia programmer: Handicapped height Bathroom Accessibility: Yes How Accessible: Accessible via walker [place]: Built-in shower seat;Grab bars in shower;Straight cane;Other (comment) (had borrowed RW that is too tall for him (present)) Prior Function Level of Independence: Independent with assistive device(s) (primarily has used cane until just recently) Able to Take Stairs?: Yes Driving: Yes Communication Communication: No difficulties    Cognition  Overall Cognitive Status: Appears within functional limits for tasks assessed/performed Arousal/Alertness: Awake/alert Orientation Level: Appears intact for tasks assessed Behavior During Session: Anxious Cognition - Other Comments: able to recall instructions given him and return demonstrate    Extremity/Trunk Assessment Right Lower Extremity Assessment RLE ROM/Strength/Tone: [facility] for tasks assessed RLE Sensation: WFL - Light Touch (reports numbness  gone since got blood) Left Lower Extremity Assessment LLE ROM/Strength/Tone: WFL for tasks assessed LLE Sensation: WFL - Light Touch (reports numbness gone since got blood)   Balance    End of Session PT - End of Session Equipment Utilized During Treatment: Gait belt Activity Tolerance: Other (comment) (limited by dyspnea) Patient left: in chair;with call bell/phone within reach;with family/visitor present (wife present for all education) Nurse Communication: Mobility status;Other (comment) (OK for d/c once RW and HHPT set up)  GP     Izaiah Tabb 12/28/2011, 5:12 PM  Pager [phone]

## 2011-12-28 NOTE — Discharge Summary (Signed)
Patient ID: Eduardo [name] MRN: [id number] DOB/AGE: 1931/03/19 78 y.o.  Admit date: 12/25/2011 Discharge date: 12/28/2011 Primary Physician: [name], MD Primary Discharge Diagnosis: Acute blood loss anemia Secondary Discharge Diagnosis: Non-ST elevation myocardial infarction, type II secondary to anemia  Acute on chronic diastolic heart failure, do to severe anemia  Anticoagulation therapy producing coagulopathy and potentially contributing to anemia  Frequent falls with trauma including recent rib fractures  COPD  Chronic atrial fibrillation,  Chronic coronary disease with suspected total occlusion of the right coronary artery  Hypertension  Depression  Patient Active Problem List  Diagnosis  . HYPERLIPIDEMIA  . ANEMIA-NOS  . ANXIETY  . HYPERTENSION  . CORONARY ARTERY DISEASE  . Atrial fibrillation  . ALLERGIC RHINITIS  . COPD  . DYSPNEA/SHORTNESS OF BREATH  . Edema  . Back pain with radiation  . Sensory loss  . Leg pain, bilateral  . Contusion of hip, left  . Anemia  . Pulmonary edema  . NSTEMI (non-ST elevated myocardial infarction)  . Acute on chronic diastolic heart failure  . Anticoagulation adequate with anticoagulant therapy  . Frequent falls   Significant Diagnostic Studies: Chest, abdominal, and pelvic CT scan on 12/25/2011  Consults: Pulmonary and [place] Hospital Course: Eduardo [name] was admitted after presenting to my office with a large oral abdominal and flank ecchymosis, pallor, dyspnea, and chest pain. He was found to have severe anemia with a hemoglobin of 5.6. He has been on chronic Coumadin therapy for atrial fibrillation embolic prophylaxis and recently has been unstable on his feet and has had frequent falls. Anemia evaluation suggested iron deficiency. He was transfused 4 units of blood during the hospital stay. Anemia and volume expansion related to transfusion exacerbated his known chronic diastolic heart  failure requiring aggressive diuresis. The superimposed COPD with wheezing required a pulmonary consultation to help with broncho-dilator management. Cardiac markers suggested myocardial injury, compatible with a non-ST elevation type II event due to anemia. After transfusion the hemoglobin was above 10.5. He denied angina. We decided to discontinue Coumadin therapy because of his anemia and increasing frailty with falls. CT scans done on admission did not demonstrate evidence of significant intracavitary bleeding to  Discharge Exam: Blood pressure 129/81, pulse 89, temperature 97.9 F (36.6 C), temperature source Oral, resp. rate 19, height 5\' 3"  (1.6 m), weight 76.9 kg (169 lb 8.5 oz), SpO2 98.00%.   Pallor has resolved.  Neck veins are nondistended.  Lungs reveal no wheezes or rhonchi. Faint rales are heard at the bases bilaterally.  No peripheral edema.  Rhythm is irregularly irregular.  Neurological status is grossly intact to Labs:   Lab Results  Component Value Date   WBC 11.4* 12/28/2011   HGB 10.7* 12/28/2011   HCT 32.3* 12/28/2011   MCV 86.1 12/28/2011   PLT 249 12/28/2011    Lab 12/28/11 0630 12/26/11 0555  NA 137 --  K 3.2* --  CL 98 --  CO2 27 --  BUN 29* --  CREATININE 0.90 --  CALCIUM 8.9 --  PROT -- 6.8  BILITOT -- 2.2*  ALKPHOS -- 131*  ALT -- 62*  AST -- 100*  GLUCOSE 99 --   Lab Results  Component Value Date   CKTOTAL 811* 12/25/2011   CKMB 19.6* 12/25/2011   TROPONINI 0.73* 12/25/2011    Lab Results  Component Value Date   CHOL 136 12/06/2009   CHOL 134 05/31/2009   CHOL 139 11/16/2008   Lab Results  Component Value Date   HDL 45.70 12/06/2009   HDL 46.60 05/31/2009   HDL 41.70 11/16/2008     Radiology: CT scan and  x-rays were performed and are contained within health link EKG: Atrial fibrillation.  FOLLOW UP PLANS AND APPOINTMENTS Discharge Orders    Future Appointments: Provider: Department: Dept Phone: Center:   02/25/2012 3:00 PM Venia Carbon, MD [facility] [phone] [id number]     Medication List  As of 12/28/2011 10:42 AM   STOP taking these medications         HYDROcodone-acetaminophen 5-325 MG per tablet         TAKE these medications         albuterol 108 (90 BASE) MCG/ACT inhaler   Commonly known as: PROVENTIL HFA;VENTOLIN HFA   Inhale 2 puffs into the lungs every 6 (six) hours as needed. For shortness of breath      aspirin 81 MG tablet   Take 81 mg by mouth daily.      atorvastatin 80 MG tablet   Commonly known as: LIPITOR   Take 80 mg by mouth at bedtime.      Azelastine HCl 0.15 % Soln   2 sprays each nostril twice a day      buPROPion 300 MG 24 hr tablet   Commonly known as: WELLBUTRIN XL   Take 1 tablet by mouth daily      cetirizine 10 MG tablet   Commonly known as: ZYRTEC   Take 10 mg by mouth daily.      Co Q-10 50 MG Caps   Take 50 mg by mouth daily.      escitalopram 20 MG tablet   Commonly known as: LEXAPRO   Take 30 mg by mouth daily.      ferrous sulfate 325 (65 FE) MG tablet   Take 1 tablet (325 mg total) by mouth 2 (two) times daily with a meal.      fish oil-omega-3 fatty acids 1000 MG capsule   Take 4 capsules (4 g total) by mouth daily.      nitroGLYCERIN 0.4 MG SL tablet   Commonly known as: NITROSTAT   Place 1 tablet (0.4 mg total) under the tongue every 5 (five) minutes x 3 doses as needed for chest pain.      omega-3 acid ethyl esters 1 G capsule   Commonly known as: LOVAZA   Take 1 g by mouth 4 (four) times daily.      tiotropium 18 MCG inhalation capsule   Commonly known as: SPIRIVA   Place 36 mcg into inhaler and inhale daily.      tobramycin 0.3 % ophthalmic solution   Commonly known as: TOBREX   Place 1 drop into the right eye 2 (two) times daily.      verapamil 240 MG 24 hr capsule   Commonly known as: VERELAN PM   Take 1 tablet by mouth daily           Follow-up Information    Follow up with Sinclair Grooms, MD in 2 weeks. (Call to  make appointment)    Contact information:   [address] [place] [phone]          BR[facility] [place] UP APPOINTMENTS  Time spent with patient to include physician time: 30 minutes Signed: Sinclair Grooms 12/28/2011, 10:42 AM

## 2012-01-01 ENCOUNTER — Telehealth: Payer: Self-pay | Admitting: Emergency Medicine

## 2012-01-01 NOTE — Telephone Encounter (Signed)
lmomtcb x1 --pt can be scheduled to see TP

## 2012-01-02 NOTE — Telephone Encounter (Signed)
Pt called back and Holly to schedule the appt. Nothing further needed.

## 2012-01-02 NOTE — Telephone Encounter (Signed)
Attempt to call patient on home and cell number provided, no answer.  LMOMTCB on both.

## 2012-01-02 NOTE — Telephone Encounter (Signed)
Patient would like OV with ONLY you, D/C from hospital 12/28/11... In next two week you are full, ok to double book on a day with only OV? Please advise , thanks.

## 2012-01-02 NOTE — Telephone Encounter (Signed)
Yes that's ok

## 2012-01-02 NOTE — Telephone Encounter (Signed)
Set an appt w/ RB for 01/09/12 at 2:00 pm.  Pt's wife verbalized understanding & stated nothing further needed at this time.  Satira Anis

## 2012-01-02 NOTE — Telephone Encounter (Signed)
Pt came home from the hospital on Fri., 12/28/11 and is requesting a HFU with RB only. I offered to get the pt in with TP but they are only wanting to see Rb. I will forward to Lauren to see if we can work the pt in on 01/14/12 @ 1:30pm. RB has only OV's on that day. Pls advise.

## 2012-01-09 ENCOUNTER — Encounter: Payer: Self-pay | Admitting: Emergency Medicine

## 2012-01-09 ENCOUNTER — Ambulatory Visit (INDEPENDENT_AMBULATORY_CARE_PROVIDER_SITE_OTHER): Payer: Medicare Other | Admitting: Emergency Medicine

## 2012-01-09 VITALS — BP 136/80 | HR 97 | Temp 97.4°F | Ht 63.0 in | Wt 155.0 lb

## 2012-01-09 DIAGNOSIS — J449 Chronic obstructive pulmonary disease, unspecified: Secondary | ICD-10-CM

## 2012-01-09 DIAGNOSIS — D649 Anemia, unspecified: Secondary | ICD-10-CM

## 2012-01-09 NOTE — Assessment & Plan Note (Signed)
Fe-deficient, also with low haptoglobin during hospitalization, although no other evidence hemolysis - CBC to be followed by Dr [name] - ? CSY/EGD if dropping

## 2012-01-09 NOTE — Patient Instructions (Addendum)
Please continue your Spiriva every day Follow with Dr Lamonte Sakai in 6 months or sooner if you have any problems

## 2012-01-09 NOTE — Progress Notes (Addendum)
  Subjective:    Patient ID: Eduardo [name], male    DOB: 1931-04-21, 78 y.o.   MRN: [id number] HPI 78 yo man, former smoker, CAD s/p MI 1/12, A Fib, allergies, exertional SOB for a few yrs. This has been worse since recent hosp in 1/12 - was cathed x 2, no intervention made, course c/b renal failure. 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 No hypoxemia reported with exercise. Was sent from the hospital on metoprolol (new). He has heard wheezing, especially when nasal allergies. Arlyce Harman at Dr Alla German office suggested AFL/COPD.   ROV 09/22/10 -- f/u SOB due to presumed COPD, CAD/MI, A Fib. Last time we started prn SABA to see if he would benefit - may have helped him some. He had full PFT's (09/18/10) . Dr Tamala Julian changed his metoprolol to verapamil but no change in breathing; he did get LE edema. He continues to have wheeze, cough, DOE. Also currently having severe PND and feels that it is contributing to his trouble. Taking allegra qd.  ROV 11/15/10 -- follow up for multifactorial dyspnea, CAD, probable COPD, allergic rhinitis. Returns today after we started Symbicort at last visit. He didn't fel that the SymbicorContinues to do Rehab at [facility]. The therapists were concerned that he was having exertional dyspnea, having to stop during workouts. Over last few days more allergy sx, on fexafenadine.   ROV 12/15/10 -- probable COPD, CAD, allergic rhinitis. He didn't improve w Symbicort, so we started Spiriva last visit. He believes that his breathing is a bit better. He has done better at Rehab on eliptical and treadmill. No hypoxemia. He is taking both allegra and zyrtec for allergies, as well as nasonex - still has severe PND and associated cough.   ROB 01/29/11 -- probable COPD, CAD, allergic rhinitis. Last time we discussed change from nasal steroid to astelin + NSW + zyrtec. He did not make the change. He isn't having the same degree of cough, but still with nasal gtt. Remains on Spiriva and  doing well.   ROV 05/14/11 -- probable COPD, CAD, allergic rhinitis. Returns for f/u. Last time changed nasal steroid to astepro. Still has significant nasal gtt, wheeze in the am, clearing mucous from throat. Arlyce Harman 4/12 was not impressive for significant AFL.   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 Was recently hospitalized with acute on chronic anemia while on coumadin. Also some degree of diastolic CHF and pulmonary edema. Feels much stronger, breathing is better. Occult blood negative. Fe started during the hosp.   Review of Systems As per HPI    Objective:  Physical Exam Filed Vitals:   01/09/12 1401  BP: 136/80  Pulse: 97  Temp: 97.4 F (36.3 C)   Gen: Pleasant, well-nourished, in no distress,  normal affect  ENT: No lesions,  mouth clear,  oropharynx clear, no postnasal drip  Neck: No JVD, no TMG, no carotid bruits  Lungs: No use of accessory muscles, no dullness to percussion, clear without rales or rhonchi  Cardiovascular: RRR, heart sounds normal, no murmur or gallops, no peripheral edema  Musculoskeletal: No deformities, no cyanosis or clubbing  Neuro: alert, non focal  Skin: Warm, no lesions or rashes  Assessment & Plan:  COPD Stable  - conitnue spiriva and prn SABA  Anemia Fe-deficient, also with low haptoglobin during hospitalization, although no other evidence hemolysis - CBC to be followed by Dr [name] - ? CSY/EGD if dropping

## 2012-01-09 NOTE — Assessment & Plan Note (Signed)
Stable  - conitnue spiriva and prn SABA

## 2012-01-10 ENCOUNTER — Encounter: Payer: Self-pay | Admitting: Internal Medicine

## 2012-01-11 ENCOUNTER — Encounter: Payer: Self-pay | Admitting: Internal Medicine

## 2012-01-11 ENCOUNTER — Ambulatory Visit (INDEPENDENT_AMBULATORY_CARE_PROVIDER_SITE_OTHER): Payer: Medicare Other | Admitting: Internal Medicine

## 2012-01-11 VITALS — BP 120/68 | HR 75 | Temp 97.9°F | Ht 63.0 in | Wt 159.2 lb

## 2012-01-11 DIAGNOSIS — I4891 Unspecified atrial fibrillation: Secondary | ICD-10-CM

## 2012-01-11 DIAGNOSIS — I214 Non-ST elevation (NSTEMI) myocardial infarction: Secondary | ICD-10-CM

## 2012-01-11 DIAGNOSIS — D649 Anemia, unspecified: Secondary | ICD-10-CM

## 2012-01-11 LAB — CBC WITH DIFFERENTIAL/PLATELET
Basophils Relative: 0.9 % (ref 0.0–3.0)
Eosinophils Absolute: 0.3 10*3/uL (ref 0.0–0.7)
Eosinophils Relative: 4.2 % (ref 0.0–5.0)
HCT: 40.4 % (ref 39.0–52.0)
Hemoglobin: 13.2 g/dL (ref 13.0–17.0)
Lymphocytes Relative: 23 % (ref 12.0–46.0)
Lymphs Abs: 1.9 10*3/uL (ref 0.7–4.0)
MCHC: 32.7 g/dL (ref 30.0–36.0)
MCV: 88.8 fl (ref 78.0–100.0)
Monocytes Absolute: 0.7 10*3/uL (ref 0.1–1.0)
Monocytes Relative: 8.3 % (ref 3.0–12.0)
Neutro Abs: 5.2 10*3/uL (ref 1.4–7.7)
Neutrophils Relative %: 63.6 % (ref 43.0–77.0)
Platelets: 301 10*3/uL (ref 150.0–400.0)
RBC: 4.55 Mil/uL (ref 4.22–5.81)
WBC: 8.1 10*3/uL (ref 4.5–10.5)

## 2012-01-11 LAB — BASIC METABOLIC PANEL
BUN: 21 mg/dL (ref 6–23)
Calcium: 9.7 mg/dL (ref 8.4–10.5)
Chloride: 101 mEq/L (ref 96–112)
Creatinine, Ser: 0.8 mg/dL (ref 0.4–1.5)
GFR: 103.66 mL/min (ref >60.00)
Glucose, Bld: 77 mg/dL (ref 70–99)
Potassium: 5 mEq/L (ref 3.5–5.1)

## 2012-01-11 NOTE — Assessment & Plan Note (Signed)
Due to severe anemia Doing well now with increased stamina, etc No med changes needed

## 2012-01-11 NOTE — Progress Notes (Signed)
Subjective:    Patient ID: Eduardo [name], male    DOB: April 17, 1931, 78 y.o.   MRN: [id number]  HPI Here with wife Feels better  Hospitalized for severe anemia after bad hematoma from fall Had non Q MI related to the anemia On iron now  Wife has noted improvement in functional status over the past 2 weeks Now able to walk independently with cane  Breathing has improved No longer thrashing around at night No wheezing No med changes other than changing coumadin to aspirin  No palpitations No chest pain No edema or just a trace  Current Outpatient Prescriptions on File Prior to Visit  Medication Sig Dispense Refill  . albuterol (PROVENTIL HFA;VENTOLIN HFA) 108 (90 BASE) MCG/ACT inhaler Inhale 2 puffs into the lungs every 6 (six) hours as needed. For shortness of breath      . aspirin 81 MG tablet Take 81 mg by mouth daily.        Marland Kitchen atorvastatin (LIPITOR) 80 MG tablet Take 80 mg by mouth at bedtime.      . Azelastine HCl (ASTEPRO) 0.15 % SOLN 2 sprays each nostril twice a day  90 mL  3  . buPROPion (WELLBUTRIN XL) 300 MG 24 hr tablet Take 1 tablet by mouth daily       . cetirizine (ZYRTEC) 10 MG tablet Take 10 mg by mouth daily.      . Coenzyme Q10 (CO Q-10) 50 MG CAPS Take 50 mg by mouth daily.      Marland Kitchen escitalopram (LEXAPRO) 20 MG tablet Take 30 mg by mouth daily.      . ferrous sulfate 325 (65 FE) MG tablet Take 1 tablet (325 mg total) by mouth 2 (two) times daily with a meal.  60 tablet  3  . fish oil-omega-3 fatty acids 1000 MG capsule Take 4 capsules (4 g total) by mouth daily.      . nitroGLYCERIN (NITROSTAT) 0.4 MG SL tablet Place 1 tablet (0.4 mg total) under the tongue every 5 (five) minutes x 3 doses as needed for chest pain.  25 tablet  3  . tiotropium (SPIRIVA) 18 MCG inhalation capsule Place 36 mcg into inhaler and inhale daily.      . verapamil (VERELAN PM) 240 MG 24 hr capsule Take 1 tablet by mouth daily         Allergies  Allergen Reactions  . Penicillins  Itching  . Sulfonamide Derivatives Other (See Comments)    "it affected my kidneys"    Past Medical History  Diagnosis Date  . Anemia   . Anxiety   . Atrial fibrillation   . CAD (coronary artery disease)   . Hyperlipidemia   . COPD (chronic obstructive pulmonary disease)   . Allergy   . Osteoarthritis   . Hx of colonic polyps   . Hyperplastic polyp of intestine 2001  . Adenomatous polyp 2001  . Hypertension 12/25/11    pt denies this history  . Myocardial infarction 06/2010    "twice; 1 day apart; during/after cath"  . Exertional dyspnea   . S/P appy     Past Surgical History  Procedure Date  . Appendectomy   . Carpal tunnel release     left  . Mastoidectomy     x3 on right  . Pilonidal cyst / sinus excision   . Knee arthroscopy     left, right knee 10/2009  . Shoulder arthroscopy     right  . Foot fracture surgery  right heel repair  . Nasal septum surgery     nasal septal repair  . Penile prosthesis implant     x 2--got infection after 2nd and had to remove  . Cataract extraction w/ intraocular lens implant 2/13    Dr [name]  . Carpal tunnel release 10/11/11    Dr Rush Barer  . Tonsillectomy and adenoidectomy   . Fracture surgery   . Cardiac catheterization 2012    50% LAD and luminal irreg in RCA, 1/12  Post cath MI from damage  . Penile prosthesis  removal     Family History  Problem Relation Age of Onset  . Diabetes Neg Hx   . Hypertension Neg Hx   . Heart attack Father   . Cancer Mother     ? leukemia    History   Social History  . Marital Status: Married    Spouse Name: N/A    Number of Children: 4  . Years of Education: N/A   Occupational History  . retired Chief Strategy Officer the job trainin)    Social History Main Topics  . Smoking status: Former Smoker -- 2.5 packs/day for 30 years    Types: Cigarettes    Quit date: 06/11/1978  . Smokeless tobacco: Never Used  . Alcohol Use: Yes     QUIT [id number] alcoholic  . Drug  Use: No  . Sexually Active: Not Currently   Other Topics Concern  . 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 Quit in 1980--alcoholicHas living will. Has designated wife, then step daughter [name] have health care POA. Not sure about DNR. Would leave decisions about feeding tube to wife at this timeDaily Caffeine Use: 4 dailyAlcohol drinks/day:  0Packs/Day:  2.5Pack years:  75pk-yrs   Review of Systems Sleeping very well now Appetite is great    Objective:   Physical Exam  Constitutional: He appears well-developed and well-nourished. No distress.  Neck: Normal range of motion. Neck supple. No thyromegaly present.  Cardiovascular: Normal rate, regular rhythm and normal heart sounds.  Exam reveals no gallop.   No murmur heard. Pulmonary/Chest: Effort normal and breath sounds normal. No respiratory distress. He has no wheezes. He has no rales.  Abdominal: Soft. There is no tenderness.  Musculoskeletal: He exhibits no edema.       Still with hard hematoma without tenderness on lower left back (above hip)  Lymphadenopathy:    He has no cervical adenopathy.  Psychiatric: He has a normal mood and affect. His behavior is normal.          Assessment & Plan:

## 2012-01-11 NOTE — Assessment & Plan Note (Signed)
Regular now Will continue just aspirin given the severity of bleeding he had

## 2012-01-11 NOTE — Assessment & Plan Note (Signed)
Mostly due to blood loss in hematoma Will recheck now Stop iron if hgb over 11

## 2012-01-15 ENCOUNTER — Encounter: Payer: Self-pay | Admitting: *Deleted

## 2012-01-16 ENCOUNTER — Telehealth: Payer: Self-pay | Admitting: *Deleted

## 2012-01-16 DIAGNOSIS — R296 Repeated falls: Secondary | ICD-10-CM

## 2012-01-16 DIAGNOSIS — M549 Dorsalgia, unspecified: Secondary | ICD-10-CM

## 2012-01-16 NOTE — Telephone Encounter (Signed)
[facility] calling wants order faxed to [facility] outpatient physical therapy b/c patient is being discharged from home health.

## 2012-01-21 NOTE — Telephone Encounter (Signed)
Order faxed to [facility]

## 2012-02-01 ENCOUNTER — Other Ambulatory Visit: Payer: Self-pay | Admitting: Emergency Medicine

## 2012-02-10 ENCOUNTER — Encounter: Payer: Self-pay | Admitting: Internal Medicine

## 2012-02-25 ENCOUNTER — Ambulatory Visit (INDEPENDENT_AMBULATORY_CARE_PROVIDER_SITE_OTHER): Payer: Medicare Other | Admitting: Internal Medicine

## 2012-02-25 ENCOUNTER — Encounter: Payer: Self-pay | Admitting: Internal Medicine

## 2012-02-25 VITALS — BP 142/82 | HR 74 | Temp 98.0°F | Ht 63.0 in | Wt 163.0 lb

## 2012-02-25 DIAGNOSIS — I2510 Atherosclerotic heart disease of native coronary artery without angina pectoris: Secondary | ICD-10-CM

## 2012-02-25 DIAGNOSIS — Z0000 Encounter for general adult medical examination without abnormal findings: Secondary | ICD-10-CM

## 2012-02-25 DIAGNOSIS — E785 Hyperlipidemia, unspecified: Secondary | ICD-10-CM

## 2012-02-25 DIAGNOSIS — I4891 Unspecified atrial fibrillation: Secondary | ICD-10-CM

## 2012-02-25 DIAGNOSIS — J449 Chronic obstructive pulmonary disease, unspecified: Secondary | ICD-10-CM

## 2012-02-25 DIAGNOSIS — I10 Essential (primary) hypertension: Secondary | ICD-10-CM

## 2012-02-25 NOTE — Assessment & Plan Note (Signed)
Still has DOE but this is stable Exercise tolerance is improving with regular exercise No changes in meds

## 2012-02-25 NOTE — Assessment & Plan Note (Signed)
Recent MI related to severe anemia Better now Working on increasing fitness

## 2012-02-25 NOTE — Assessment & Plan Note (Signed)
Dr Tamala Julian checked labs recently

## 2012-02-25 NOTE — Progress Notes (Signed)
Subjective:    Patient ID: Eduardo [name], male    DOB: [birth year], 78 y.o.   MRN: [id number]  HPI Here for physical Wife is here  Has gotten back to regular exercise Still some limitations in exercise tolerance but is improving Has trouble walking still---now using cane though (most he can go is 4 blocks) Still working with therapists for balance training  Still has some foot swelling Not a big problem though Does sit on stool with legs down  Current Outpatient Prescriptions on File Prior to Visit  Medication Sig Dispense Refill  . albuterol (PROVENTIL HFA;VENTOLIN HFA) 108 (90 BASE) MCG/ACT inhaler Inhale 2 puffs into the lungs every 6 (six) hours as needed. For shortness of breath      . aspirin 81 MG tablet Take 81 mg by mouth daily.        . ASTEPRO 0.15 % SOLN USE 2 SPRAYS IN EACH NOSTRIL TWICE A DAY  3 mL  2  . atorvastatin (LIPITOR) 80 MG tablet Take 80 mg by mouth at bedtime.      Marland Kitchen buPROPion (WELLBUTRIN XL) 300 MG 24 hr tablet Take 1 tablet by mouth daily       . cetirizine (ZYRTEC) 10 MG tablet Take 10 mg by mouth daily.      . Coenzyme Q10 (CO Q-10) 50 MG CAPS Take 50 mg by mouth daily.      Marland Kitchen escitalopram (LEXAPRO) 20 MG tablet Take 40 mg by mouth daily.       . nitroGLYCERIN (NITROSTAT) 0.4 MG SL tablet Place 1 tablet (0.4 mg total) under the tongue every 5 (five) minutes x 3 doses as needed for chest pain.  25 tablet  3  . tiotropium (SPIRIVA) 18 MCG inhalation capsule Place 36 mcg into inhaler and inhale daily.      . verapamil (VERELAN PM) 240 MG 24 hr capsule Take 1 tablet by mouth daily         Allergies  Allergen Reactions  . Penicillins Itching  . Sulfonamide Derivatives Other (See Comments)    "it affected my kidneys"    Past Medical History  Diagnosis Date  . Anemia   . Anxiety   . Atrial fibrillation   . CAD (coronary artery disease)   . Hyperlipidemia   . COPD (chronic obstructive pulmonary disease)   . Allergy   . Osteoarthritis   . Hx  of colonic polyps   . Hyperplastic polyp of intestine 2001  . Adenomatous polyp 2001  . Hypertension 12/25/11    pt denies this history  . Myocardial infarction 06/2010    "twice; 1 day apart; during/after cath"  . Exertional dyspnea   . S/P appy     Past Surgical History  Procedure Date  . Appendectomy   . Carpal tunnel release     left  . Mastoidectomy     x3 on right  . Pilonidal cyst / sinus excision   . Knee arthroscopy     left, right knee 10/2009  . Shoulder arthroscopy     right  . Foot fracture surgery     right heel repair  . Nasal septum surgery     nasal septal repair  . Penile prosthesis implant     x 2--got infection after 2nd and had to remove  . Cataract extraction w/ intraocular lens implant 2/13    Dr [name]  . Carpal tunnel release 10/11/11    Dr Rush Barer  . Tonsillectomy and adenoidectomy   .  Fracture surgery   . Cardiac catheterization 2012    50% LAD and luminal irreg in RCA, 1/12  Post cath MI from damage  . Penile prosthesis  removal     Family History  Problem Relation Age of Onset  . Diabetes Neg Hx   . Hypertension Neg Hx   . Heart attack Father   . Cancer Mother     ? leukemia    History   Social History  . Marital Status: Married    Spouse Name: N/A    Number of Children: 4  . Years of Education: N/A   Occupational History  . retired Chief Strategy Officer the job trainin)    Social History Main Topics  . Smoking status: Former Smoker -- 2.5 packs/day for 30 years    Types: Cigarettes    Quit date: 06/11/1978  . Smokeless tobacco: Never Used  . Alcohol Use: Yes     QUIT [id number] alcoholic  . Drug Use: No  . Sexually Active: Not Currently   Other Topics Concern  . Not on file   Social History Narrative   Counselling psychologist (on the job training)Grew up in [place], [place]  and near [place]2 stepchildrenFormer Smoker--quit in 1980Alcohol use-no. Quit in  1980--alcoholicHas living will. Has designated wife, then step daughter [name] have health care POA.Would accept resuscitation attempts.No feeding tube if cognitively unaware.   Review of Systems  Constitutional: Negative for fatigue and unexpected weight change.       Wears seat belt  HENT: Positive for hearing loss, congestion, rhinorrhea and postnasal drip. Negative for dental problem and tinnitus.        Ragweed sensitive---uses zyrtec Regular with dentist  Eyes: Negative for visual disturbance.       No diplopia or unilateral vision loss  Respiratory: Positive for cough and shortness of breath. Negative for chest tightness.        Stable DOE---like going up a flight of stairs  Cardiovascular: Positive for leg swelling. Negative for chest pain and palpitations.  Gastrointestinal: Negative for nausea, vomiting, abdominal pain, constipation and blood in stool.       No heartburn  Genitourinary: Negative for dysuria and difficulty urinating.       No sex--no problem  Musculoskeletal: Positive for back pain and arthralgias. Negative for joint swelling.  Skin: Negative for rash.       No suspicious lesions  Neurological: Positive for weakness and numbness. Negative for dizziness, syncope, light-headedness and headaches.       Numbness in toes bilaterally--intermittently  Hematological: Negative for adenopathy. Bruises/bleeds easily.  Psychiatric/Behavioral: Negative for disturbed wake/sleep cycle and dysphoric mood. The patient is nervous/anxious.        Does get short tempered at times---escitalopram increased On wellbutrin just in Am       Objective:   Physical Exam  Constitutional: He appears well-developed and well-nourished. No distress.  Neck: Normal range of motion. Neck supple. No thyromegaly present.  Cardiovascular: Normal rate, normal heart sounds and intact distal pulses.  Exam reveals no gallop.   No murmur heard.      Irregular Faint distal pulses    Pulmonary/Chest: Effort normal and breath sounds normal. No respiratory distress. He has no wheezes. He has no rales.  Abdominal: Soft. There is no tenderness.  Musculoskeletal: He exhibits no edema and no tenderness.  Lymphadenopathy:    He has no cervical adenopathy.  Neurological:       Gait normal for short distances No foot  drop  Psychiatric: He has a normal mood and affect. His behavior is normal.          Assessment & Plan:

## 2012-02-25 NOTE — Assessment & Plan Note (Signed)
BP Readings from Last 3 Encounters:  02/25/12 142/82  01/11/12 120/68  01/09/12 136/80   Reasonable control No changes for now

## 2012-02-25 NOTE — Assessment & Plan Note (Signed)
Good rate control ASA only due to bleeding problems

## 2012-02-25 NOTE — Assessment & Plan Note (Signed)
Doing better now UTD on colonoscopy Imms UTD

## 2012-03-20 ENCOUNTER — Other Ambulatory Visit: Payer: Self-pay | Admitting: Emergency Medicine

## 2012-06-11 DIAGNOSIS — G629 Polyneuropathy, unspecified: Secondary | ICD-10-CM

## 2012-06-11 HISTORY — DX: Polyneuropathy, unspecified: G62.9

## 2012-07-03 ENCOUNTER — Other Ambulatory Visit: Payer: Self-pay | Admitting: Emergency Medicine

## 2012-07-10 ENCOUNTER — Ambulatory Visit: Payer: Medicare Other | Admitting: Emergency Medicine

## 2012-07-18 ENCOUNTER — Ambulatory Visit (INDEPENDENT_AMBULATORY_CARE_PROVIDER_SITE_OTHER): Payer: Medicare Other | Admitting: Emergency Medicine

## 2012-07-18 ENCOUNTER — Encounter: Payer: Self-pay | Admitting: Emergency Medicine

## 2012-07-18 ENCOUNTER — Other Ambulatory Visit (INDEPENDENT_AMBULATORY_CARE_PROVIDER_SITE_OTHER): Payer: Medicare Other

## 2012-07-18 VITALS — BP 158/82 | HR 71 | Temp 98.3°F | Ht 63.0 in | Wt 169.8 lb

## 2012-07-18 DIAGNOSIS — J449 Chronic obstructive pulmonary disease, unspecified: Secondary | ICD-10-CM

## 2012-07-18 DIAGNOSIS — J309 Allergic rhinitis, unspecified: Secondary | ICD-10-CM

## 2012-07-18 DIAGNOSIS — D649 Anemia, unspecified: Secondary | ICD-10-CM

## 2012-07-18 LAB — CBC WITH DIFFERENTIAL/PLATELET
Basophils Absolute: 0 10*3/uL (ref 0.0–0.1)
Eosinophils Relative: 6.4 % — ABNORMAL HIGH (ref 0.0–5.0)
HCT: 36.5 % — ABNORMAL LOW (ref 39.0–52.0)
Lymphs Abs: 1.8 10*3/uL (ref 0.7–4.0)
Monocytes Relative: 8.5 % (ref 3.0–12.0)
Neutrophils Relative %: 61.1 % (ref 43.0–77.0)
Platelets: 183 10*3/uL (ref 150.0–400.0)
RDW: 14.1 % (ref 11.5–14.6)
WBC: 7.9 10*3/uL (ref 4.5–10.5)

## 2012-07-18 NOTE — Assessment & Plan Note (Addendum)
Try changing the timing of the [place] so that it doesn't drain to his throat when he goes to bed. continue zyrtec and astepro, try going to tid

## 2012-07-18 NOTE — Patient Instructions (Addendum)
Please continue spiriva Continue zyrtec Change the time of your nasal saline washes to the afternoon (not before bedtime) Try using your astepro 2 -3 times a day We will check your hemoglobin today Follow with Dr Lamonte Sakai in 4 months or sooner if you have any problems.

## 2012-07-18 NOTE — Assessment & Plan Note (Signed)
Will check hemoglobin today at his request

## 2012-07-18 NOTE — Assessment & Plan Note (Signed)
spiriva + SABA prn 

## 2012-07-18 NOTE — Progress Notes (Signed)
Subjective:    Patient ID: Eduardo [name], male    DOB: [birth year], 79 y.o.   MRN: [id number] HPI 79 yo man, former smoker, CAD s/p MI 1/12, A Fib, allergies, exertional SOB for a few yrs. This has been worse since recent hosp in 1/12 - was cathed x 2, no intervention made, course c/b renal failure. 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 No hypoxemia reported with exercise. Was sent from the hospital on metoprolol (new). He has heard wheezing, especially when nasal allergies. Arlyce Harman at Dr Alla German office suggested AFL/COPD.   ROV 09/22/10 -- f/u SOB due to presumed COPD, CAD/MI, A Fib. Last time we started prn SABA to see if he would benefit - may have helped him some. He had full PFT's (09/18/10) . Dr Tamala Julian changed his metoprolol to verapamil but no change in breathing; he did get LE edema. He continues to have wheeze, cough, DOE. Also currently having severe PND and feels that it is contributing to his trouble. Taking allegra qd.  ROV 11/15/10 -- follow up for multifactorial dyspnea, CAD, probable COPD, allergic rhinitis. Returns today after we started Symbicort at last visit. He didn't fel that the SymbicorContinues to do Rehab at [facility]. The therapists were concerned that he was having exertional dyspnea, having to stop during workouts. Over last few days more allergy sx, on fexafenadine.   ROV 12/15/10 -- probable COPD, CAD, allergic rhinitis. He didn't improve w Symbicort, so we started Spiriva last visit. He believes that his breathing is a bit better. He has done better at Rehab on eliptical and treadmill. No hypoxemia. He is taking both allegra and zyrtec for allergies, as well as nasonex - still has severe PND and associated cough.   ROB 01/29/11 -- probable COPD, CAD, allergic rhinitis. Last time we discussed change from nasal steroid to astelin + NSW + zyrtec. He did not make the change. He isn't having the same degree of cough, but still with nasal gtt. Remains on Spiriva and  doing well.   ROV 05/14/11 -- probable COPD, CAD, allergic rhinitis. Returns for f/u. Last time changed nasal steroid to astepro. Still has significant nasal gtt, wheeze in the am, clearing mucous from throat. Arlyce Harman 4/12 was not impressive for significant AFL.   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 Was recently hospitalized with acute on chronic anemia while on coumadin. Also some degree of diastolic CHF and pulmonary edema. Feels much stronger, breathing is better. Occult blood negative. Fe started during the hosp.   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 Also hx chronic anemia on Fe. Returns f/u. He reports that his last Hgb w Dr Tamala Julian was 75. He is able to exert, but has some DOE after a block. Remains on Spiriva, uses SABA prn a couple times a day.  Lots of mucous and sinus drainage - taking zyrtec, NSW, astepro. Doing NSW before bed and then getting drainage to throat.   Review of Systems As per HPI    Objective:  Physical Exam Filed Vitals:   07/18/12 1131  BP: 158/82  Pulse: 71  Temp: 98.3 F (36.8 C)   Gen: Pleasant, well-nourished, in no distress,  normal affect  ENT: No lesions,  mouth clear,  oropharynx clear, no postnasal drip  Neck: No JVD, no TMG, no carotid bruits  Lungs: No use of accessory muscles, no dullness to percussion, clear without rales or rhonchi  Cardiovascular: RRR, heart  sounds normal, no murmur or gallops, no peripheral edema  Musculoskeletal: No deformities, no cyanosis or clubbing  Neuro: alert, non focal  Skin: Warm, no lesions or rashes  Assessment & Plan:  ANEMIA-NOS Will check hemoglobin today at his request  COPD spiriva + SABA prn  ALLERGIC RHINITIS Try changing the timing of the [place] so that it doesn't drain to his throat when he goes to bed. continue zyrtec and astepro, try going to tid

## 2012-07-23 NOTE — Progress Notes (Signed)
Quick Note:  Spoke with patient, made him aware of results as listed below per RB. Patient verbalized understanding and nothing further needed at this time. ______

## 2012-08-08 ENCOUNTER — Telehealth: Payer: Self-pay | Admitting: Emergency Medicine

## 2012-08-08 NOTE — Telephone Encounter (Signed)
lmomtcb x1 for pt 

## 2012-08-11 MED ORDER — TIOTROPIUM BROMIDE MONOHYDRATE 18 MCG IN CAPS: ORAL_CAPSULE | RESPIRATORY_TRACT | Status: DC

## 2012-08-11 MED ORDER — AZELASTINE HCL 0.15 % NA SOLN: NASAL | Status: DC

## 2012-08-11 MED ORDER — ALBUTEROL SULFATE HFA 108 (90 BASE) MCG/ACT IN AERS: INHALATION_SPRAY | RESPIRATORY_TRACT | Status: DC

## 2012-08-11 NOTE — Telephone Encounter (Signed)
Spiriva, proair, and astepro rxs sent to [facility].  Pt aware and voiced no further questions or concerns at this time.

## 2012-09-01 ENCOUNTER — Encounter: Payer: Self-pay | Admitting: Internal Medicine

## 2012-09-01 ENCOUNTER — Ambulatory Visit (INDEPENDENT_AMBULATORY_CARE_PROVIDER_SITE_OTHER): Payer: Medicare Other | Admitting: Internal Medicine

## 2012-09-01 VITALS — BP 138/68 | HR 92 | Temp 98.1°F | Wt 168.0 lb

## 2012-09-01 DIAGNOSIS — F411 Generalized anxiety disorder: Secondary | ICD-10-CM

## 2012-09-01 DIAGNOSIS — J449 Chronic obstructive pulmonary disease, unspecified: Secondary | ICD-10-CM

## 2012-09-01 DIAGNOSIS — I4891 Unspecified atrial fibrillation: Secondary | ICD-10-CM

## 2012-09-01 DIAGNOSIS — I10 Essential (primary) hypertension: Secondary | ICD-10-CM

## 2012-09-01 MED ORDER — MONTELUKAST SODIUM 10 MG PO TABS: 10.0000 mg | ORAL_TABLET | Freq: Every day | ORAL | Status: DC

## 2012-09-01 NOTE — Assessment & Plan Note (Signed)
BP Readings from Last 3 Encounters:  09/01/12 138/68  07/18/12 158/82  02/25/12 142/82   Good control  No changes needed

## 2012-09-01 NOTE — Assessment & Plan Note (Signed)
Controlled with the meds No wean indicated at this point

## 2012-09-01 NOTE — Patient Instructions (Signed)
Please try the montelukast at bedtime. If the drainage and wheezing are not better after 2-4 weeks, don't refill it---let me know and we will try a different nasal spray

## 2012-09-01 NOTE — Assessment & Plan Note (Signed)
Seems to be stable Has post nasal drip and cough with wheezing at night in bed They will switch pillows Try singulair Consider trying ipratropium nasal if that doesn't work

## 2012-09-01 NOTE — Progress Notes (Signed)
Subjective:    Patient ID: Eduardo [name], male    DOB: 23-Jan-1931, 79 y.o.   MRN: [id number]  HPI Here with wife Still has breathing problems---it awakens him at night (post nasal drip).  Collection of phlegm makes him cough. Then he wheezes Zyrtec and astepro don't control symptoms Okay while sitting---drainage when he lies down Gets some SOB with the wheezing  Hard time bending down also Still can't walk far  No chest pain No palpitations but notes his pulse goes up with activity (110 with minimal exertion) Still goes to the gym but not really aerobic  No depressed but can be "grouchy" per wife Anxiety is not a problem  Current Outpatient Prescriptions on File Prior to Visit  Medication Sig Dispense Refill  . aspirin 81 MG tablet Take 81 mg by mouth daily.        Marland Kitchen atorvastatin (LIPITOR) 80 MG tablet Take 80 mg by mouth at bedtime.      . Azelastine HCl (ASTEPRO) 0.15 % SOLN USE 2 SPRAYS IN EACH NOSTRIL TWICE A DAY  90 mL  1  . B Complex-C (SUPER B COMPLEX PO) Take 1 tablet by mouth daily.      Marland Kitchen buPROPion (WELLBUTRIN XL) 300 MG 24 hr tablet Take 1 tablet by mouth daily       . cetirizine (ZYRTEC) 10 MG tablet Take 10 mg by mouth daily.      . Coenzyme Q10 (CO Q-10) 50 MG CAPS Take 50 mg by mouth daily.      Marland Kitchen escitalopram (LEXAPRO) 20 MG tablet Take 40 mg by mouth daily.       . nitroGLYCERIN (NITROSTAT) 0.4 MG SL tablet Place 1 tablet (0.4 mg total) under the tongue every 5 (five) minutes x 3 doses as needed for chest pain.  25 tablet  3  . tiotropium (SPIRIVA HANDIHALER) 18 MCG inhalation capsule INHALE THE CONTENTS OF 1 CAPSULE VIA HANDIHALER DAILY  90 capsule  1  . verapamil (VERELAN PM) 240 MG 24 hr capsule Take 1 tablet by mouth daily        No current facility-administered medications on file prior to visit.    Allergies  Allergen Reactions  . Penicillins Itching  . Sulfonamide Derivatives Other (See Comments)    "it affected my kidneys"    Past Medical  History  Diagnosis Date  . Anemia   . Anxiety   . Atrial fibrillation   . CAD (coronary artery disease)   . Hyperlipidemia   . COPD (chronic obstructive pulmonary disease)   . Allergy   . Osteoarthritis   . Hx of colonic polyps   . Hyperplastic polyp of intestine 2001  . Adenomatous polyp 2001  . Hypertension 12/25/11    pt denies this history  . Myocardial infarction 06/2010    "twice; 1 day apart; during/after cath"  . Exertional dyspnea   . S/P appy     Past Surgical History  Procedure Laterality Date  . Appendectomy    . Carpal tunnel release      left  . Mastoidectomy      x3 on right  . Pilonidal cyst / sinus excision    . Knee arthroscopy      left, right knee 10/2009  . Shoulder arthroscopy      right  . Foot fracture surgery      right heel repair  . Nasal septum surgery      nasal septal repair  . Penile prosthesis implant  x 2--got infection after 2nd and had to remove  . Cataract extraction w/ intraocular lens implant  2/13    Dr [name]  . Carpal tunnel release  10/11/11    Dr Rush Barer  . Tonsillectomy and adenoidectomy    . Fracture surgery    . Cardiac catheterization  2012    50% LAD and luminal irreg in RCA, 1/12  Post cath MI from damage  . Penile prosthesis  removal      Family History  Problem Relation Age of Onset  . Diabetes Neg Hx   . Hypertension Neg Hx   . Heart attack Father   . Cancer Mother     ? leukemia    History   Social History  . Marital Status: Married    Spouse Name: N/A    Number of Children: 4  . Years of Education: N/A   Occupational History  . retired Chief Strategy Officer the job trainin)    Social History Main Topics  . Smoking status: Former Smoker -- 2.50 packs/day for 30 years    Types: Cigarettes    Quit date: 06/11/1978  . Smokeless tobacco: Never Used  . Alcohol Use: Yes     Comment: QUIT [id number] alcoholic  . Drug Use: No  . Sexually Active: Not Currently   Other Topics Concern   . Not on file   Social History Narrative   Counselling psychologist (on the job training)   Grew up in [place]   Married---2nd   2 children ([place], [place]  and near [place])--2 stepchildren   Former Smoker--quit in 1980   Alcohol use-no. Quit in 1980--alcoholic   Has living will. Has designated wife, then step daughter [name] have health care POA.   Would accept resuscitation attempts.   No feeding tube if cognitively unaware.         Review of Systems Eventually sleeps okay Appetite is good Weight is down a pound    Objective:   Physical Exam  Constitutional: He appears well-developed and well-nourished. No distress.  Neck: Normal range of motion. Neck supple. No thyromegaly present.  Cardiovascular: Normal rate, regular rhythm and normal heart sounds.  Exam reveals no gallop.   No murmur heard. Pulmonary/Chest: Effort normal and breath sounds normal. No respiratory distress. He has no wheezes. He has no rales.  Abdominal: Soft. There is no tenderness.  Musculoskeletal: He exhibits no edema and no tenderness.  Lymphadenopathy:    He has no cervical adenopathy.  Psychiatric: He has a normal mood and affect. His behavior is normal.          Assessment & Plan:

## 2012-09-01 NOTE — Assessment & Plan Note (Signed)
Regular today On only aspirin due to bleeding issues

## 2012-09-25 ENCOUNTER — Telehealth: Payer: Self-pay | Admitting: Internal Medicine

## 2012-09-25 NOTE — Telephone Encounter (Signed)
Tina/Spouserequesting wheelchair for use with history of COPD, becoming hard for him to walk; also  requesting to get off Lipitor due to symptoms: peripheral neuropathy, please call with recommendations.

## 2012-09-26 NOTE — Telephone Encounter (Signed)
Please let them know it is okay for him to try off the lipitor. If insurance is to pay for a wheelchair, it is now required that we discuss it at an office visit and I properly document the reasons for the wheelchair to be paid for. They can set up an appt for this purpose if they like or we can do it at his next appt

## 2012-09-29 NOTE — Telephone Encounter (Signed)
LMOVM to return call.

## 2012-09-30 ENCOUNTER — Telehealth: Payer: Self-pay

## 2012-09-30 NOTE — Telephone Encounter (Signed)
Same message and 1st phone note

## 2012-09-30 NOTE — Telephone Encounter (Signed)
Pt left v/m returning Dee's call.

## 2012-09-30 NOTE — Telephone Encounter (Signed)
Spoke with patient and advised results, he will call for an appt when his wife gets back in town.

## 2012-10-08 ENCOUNTER — Ambulatory Visit (INDEPENDENT_AMBULATORY_CARE_PROVIDER_SITE_OTHER): Payer: Medicare Other | Admitting: Internal Medicine

## 2012-10-08 ENCOUNTER — Encounter: Payer: Self-pay | Admitting: Internal Medicine

## 2012-10-08 VITALS — BP 120/70 | HR 74 | Temp 97.7°F | Wt 167.0 lb

## 2012-10-08 DIAGNOSIS — M159 Polyosteoarthritis, unspecified: Secondary | ICD-10-CM

## 2012-10-08 NOTE — Patient Instructions (Signed)
Please start extended release acetaminophen 650mg  three times daily.

## 2012-10-08 NOTE — Assessment & Plan Note (Signed)
Hands and hips worst Limited with walking due to dyspnea Needs wheelchair only for out of house---Medicare won't cover (Rx given for tax though) Try regular acetaminophen

## 2012-10-08 NOTE — Progress Notes (Signed)
Subjective:    Patient ID: Eduardo [name], male    DOB: 1931-04-14, 79 y.o.   MRN: [id number]  HPI Came in with wife--then she had to leave  Having more trouble getting around When he goes out, can't walk far They are interested in getting a wheelchair Has handicapped permit already  Also problems with hands Stiff in AM--up to several hours Finally loosen up Hasn't used any meds  Still with pain in posterior hips with walking  Current Outpatient Prescriptions on File Prior to Visit  Medication Sig Dispense Refill  . albuterol (PROVENTIL HFA;VENTOLIN HFA) 108 (90 BASE) MCG/ACT inhaler Inhale 2 puffs into the lungs every 4 (four) hours as needed for shortness of breath.      Marland Kitchen aspirin 81 MG tablet Take 81 mg by mouth daily.        . Azelastine HCl (ASTEPRO) 0.15 % SOLN USE 2 SPRAYS IN EACH NOSTRIL TWICE A DAY  90 mL  1  . B Complex-C (SUPER B COMPLEX PO) Take 1 tablet by mouth daily.      Marland Kitchen buPROPion (WELLBUTRIN XL) 300 MG 24 hr tablet Take 1 tablet by mouth daily       . cetirizine (ZYRTEC) 10 MG tablet Take 10 mg by mouth daily.      . Coenzyme Q10 (CO Q-10) 50 MG CAPS Take 50 mg by mouth daily.      Marland Kitchen escitalopram (LEXAPRO) 20 MG tablet Take 40 mg by mouth daily.       . montelukast (SINGULAIR) 10 MG tablet Take 1 tablet (10 mg total) by mouth at bedtime.  30 tablet  11  . nitroGLYCERIN (NITROSTAT) 0.4 MG SL tablet Place 1 tablet (0.4 mg total) under the tongue every 5 (five) minutes x 3 doses as needed for chest pain.  25 tablet  3  . tiotropium (SPIRIVA HANDIHALER) 18 MCG inhalation capsule INHALE THE CONTENTS OF 1 CAPSULE VIA HANDIHALER DAILY  90 capsule  1  . verapamil (VERELAN PM) 240 MG 24 hr capsule Take 1 tablet by mouth daily        No current facility-administered medications on file prior to visit.    Allergies  Allergen Reactions  . Penicillins Itching  . Sulfonamide Derivatives Other (See Comments)    "it affected my kidneys"    Past Medical History   Diagnosis Date  . Anemia   . Anxiety   . Atrial fibrillation   . CAD (coronary artery disease)   . Hyperlipidemia   . COPD (chronic obstructive pulmonary disease)   . Allergy   . Osteoarthritis   . Hx of colonic polyps   . Hyperplastic polyp of intestine 2001  . Adenomatous polyp 2001  . Hypertension 12/25/11    pt denies this history  . Myocardial infarction 06/2010    "twice; 1 day apart; during/after cath"  . Exertional dyspnea   . S/P appy     Past Surgical History  Procedure Laterality Date  . Appendectomy    . Carpal tunnel release      left  . Mastoidectomy      x3 on right  . Pilonidal cyst / sinus excision    . Knee arthroscopy      left, right knee 10/2009  . Shoulder arthroscopy      right  . Foot fracture surgery      right heel repair  . Nasal septum surgery      nasal septal repair  . Penile prosthesis implant  x 2--got infection after 2nd and had to remove  . Cataract extraction w/ intraocular lens implant  2/13    Dr [name]  . Carpal tunnel release  10/11/11    Dr Rush Barer  . Tonsillectomy and adenoidectomy    . Fracture surgery    . Cardiac catheterization  2012    50% LAD and luminal irreg in RCA, 1/12  Post cath MI from damage  . Penile prosthesis  removal      Family History  Problem Relation Age of Onset  . Diabetes Neg Hx   . Hypertension Neg Hx   . Heart attack Father   . Cancer Mother     ? leukemia    History   Social History  . Marital Status: Married    Spouse Name: N/A    Number of Children: 4  . Years of Education: N/A   Occupational History  . retired Chief Strategy Officer the job trainin)    Social History Main Topics  . Smoking status: Former Smoker -- 2.50 packs/day for 30 years    Types: Cigarettes    Quit date: 06/11/1978  . Smokeless tobacco: Never Used  . Alcohol Use: Yes     Comment: [id number] [id number] alcoholic  . Drug Use: No  . Sexually Active: Not Currently   Other Topics Concern  . Not  on file   Social History Narrative   Counselling psychologist (on the job training)   Grew up in [place]   Married---2nd   2 children ([place], [place]  and near [place])--2 stepchildren   Former Smoker--quit in 1980   Alcohol use-no. Quit in 1980--alcoholic   Has living will. Has designated wife, then step daughter Eduardo [name] have health care POA.   Would accept resuscitation attempts.   No feeding tube if cognitively unaware.         Review of Systems Still gets easy dyspnea also Sleeps okay    Objective:   Physical Exam  Constitutional: He appears well-developed and well-nourished. No distress.  Cardiovascular: Intact distal pulses.   Musculoskeletal:  Mild thickening in PIP>DIPs in hands Marked decrease internal rotation of left hip          Assessment & Plan:

## 2012-10-14 ENCOUNTER — Ambulatory Visit: Payer: Medicare Other | Admitting: Internal Medicine

## 2012-11-07 ENCOUNTER — Encounter: Payer: Self-pay | Admitting: Emergency Medicine

## 2012-11-07 ENCOUNTER — Ambulatory Visit (INDEPENDENT_AMBULATORY_CARE_PROVIDER_SITE_OTHER): Payer: Medicare Other | Admitting: Emergency Medicine

## 2012-11-07 VITALS — BP 152/80 | HR 100 | Temp 98.6°F | Ht 63.0 in | Wt 167.8 lb

## 2012-11-07 DIAGNOSIS — J309 Allergic rhinitis, unspecified: Secondary | ICD-10-CM

## 2012-11-07 DIAGNOSIS — R05 Cough: Secondary | ICD-10-CM

## 2012-11-07 NOTE — Assessment & Plan Note (Signed)
Continue regimen including singulair, add back nasal saline

## 2012-11-07 NOTE — Progress Notes (Signed)
Subjective:    Patient ID: Eduardo [name], male    DOB: 1931/04/22, 79 y.o.   MRN: [id number] HPI 79 yo man, former smoker, CAD s/p MI 1/12, A Fib, allergies, exertional SOB for a few yrs. This has been worse since recent hosp in 1/12 - was cathed x 2, no intervention made, course c/b renal failure. 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 No hypoxemia reported with exercise. Was sent from the hospital on metoprolol (new). He has heard wheezing, especially when nasal allergies. Arlyce Harman at Dr Alla German office suggested AFL/COPD.   ROV 09/22/10 -- f/u SOB due to presumed COPD, CAD/MI, A Fib. Last time we started prn SABA to see if he would benefit - may have helped him some. He had full PFT's (09/18/10) . Dr Tamala Julian changed his metoprolol to verapamil but no change in breathing; he did get LE edema. He continues to have wheeze, cough, DOE. Also currently having severe PND and feels that it is contributing to his trouble. Taking allegra qd.  ROV 11/15/10 -- follow up for multifactorial dyspnea, CAD, probable COPD, allergic rhinitis. Returns today after we started Symbicort at last visit. He didn't fel that the SymbicorContinues to do Rehab at [place]. The therapists were concerned that he was having exertional dyspnea, having to stop during workouts. Over last few days more allergy sx, on fexafenadine.   ROV 12/15/10 -- probable COPD, CAD, allergic rhinitis. He didn't improve w Symbicort, so we started Spiriva last visit. He believes that his breathing is a bit better. He has done better at Rehab on eliptical and treadmill. No hypoxemia. He is taking both allegra and zyrtec for allergies, as well as nasonex - still has severe PND and associated cough.   ROB 01/29/11 -- probable COPD, CAD, allergic rhinitis. Last time we discussed change from nasal steroid to astelin + NSW + zyrtec. He did not make the change. He isn't having the same degree of cough, but still with nasal gtt. Remains on Spiriva and  doing well.   ROV 05/14/11 -- probable COPD, CAD, allergic rhinitis. Returns for f/u. Last time changed nasal steroid to astepro. Still has significant nasal gtt, wheeze in the am, clearing mucous from throat. Arlyce Harman 4/12 was not impressive for significant AFL.   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 Was recently hospitalized with acute on chronic anemia while on coumadin. Also some degree of diastolic CHF and pulmonary edema. Feels much stronger, breathing is better. Occult blood negative. Fe started during the hosp.   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 Also hx chronic anemia on Fe. Returns f/u. He reports that his last Hgb w Dr Tamala Julian was 66. He is able to exert, but has some DOE after a block. Remains on Spiriva, uses SABA prn a couple times a day.  Lots of mucous and sinus drainage - taking zyrtec, NSW, astepro. Doing NSW before bed and then getting drainage to throat.   ROV 11/07/12 -- COPD, CAD, allergic rhinitis, chronic anemia. He reports that he has been having more problems at night - feels a drainage, a globus sensation, wheezing, coughing. No real snoring. He tries to clear his throat but there is nothing there. He stopped doing nasal saline washes months ago, still on zyrtec, astepro. Dr [name] added singulair recently.   Review of Systems As per HPI    Objective:  Physical Exam Filed Vitals:   11/07/12 1423  BP: 152/80  Pulse: 100  Temp: 98.6 F (37 C)   Gen: Pleasant, well-nourished, in no distress,  normal affect  ENT: No lesions,  mouth clear,  oropharynx clear, no postnasal drip  Neck: No JVD, no TMG, no carotid bruits  Lungs: No use of accessory muscles, no dullness to percussion, clear without rales or rhonchi  Cardiovascular: RRR, heart sounds normal, no murmur or gallops, no peripheral edema  Musculoskeletal: No deformities, no cyanosis or clubbing  Neuro: alert, non focal  Skin:  Warm, no lesions or rashes  Assessment & Plan:  ALLERGIC RHINITIS Continue regimen including singulair, add back nasal saline  Cough The night time predominence suggests that there might be a gerd component in addition to his known allergies. Offered to treat this empirically - he would like to wait, work on wt loss, see how he improves. We can revisit in the future,

## 2012-11-07 NOTE — Assessment & Plan Note (Addendum)
The night time predominence suggests that there might be a gerd component in addition to his known allergies. Offered to treat this empirically - he would like to wait, work on wt loss, see how he improves. We can revisit in the future,

## 2012-11-07 NOTE — Patient Instructions (Addendum)
Please continue your current medications including your singulair Restart your nasal saline washes. Don't do them right before bedtime We may want to consider starting a reflux medication in the future if you continue to have trouble.  Work on [employer] and weight loss, and we will reassess to see if a reflux medication makes sense in the future We may want to consider a sleep study in the future depending on you symptoms and improvement.  Follow with Dr Lamonte Sakai in 3 months or sooner if you have any problems.

## 2012-12-23 ENCOUNTER — Ambulatory Visit (INDEPENDENT_AMBULATORY_CARE_PROVIDER_SITE_OTHER): Payer: Medicare Other | Admitting: Internal Medicine

## 2012-12-23 ENCOUNTER — Encounter: Payer: Self-pay | Admitting: Internal Medicine

## 2012-12-23 VITALS — BP 150/80 | HR 78 | Temp 97.9°F | Wt 166.0 lb

## 2012-12-23 DIAGNOSIS — M48062 Spinal stenosis, lumbar region with neurogenic claudication: Secondary | ICD-10-CM

## 2012-12-23 NOTE — Assessment & Plan Note (Signed)
Currently only mild claudication and early strength deficits on the right Will have him take the tylenol tid eval by PT

## 2012-12-23 NOTE — Progress Notes (Signed)
Subjective:    Patient ID: Eduardo [name], male    DOB: 05/31/1931, 79 y.o.   MRN: [id number]  HPI Here with wife Having more trouble with his balance---if he leans forward, he feels ready to fall over Walks up flight of stairs at home--gets dyspneic and then his legs are more tired  Wife feels he needs work with PT Goes to gym but doesn't have any direction Last therapy was about 6 months ago  Tries to limit going out for long distances Can walk up to a block Did use the wheelchair when vacationing at [employer]  Still with AM stiffness Gets pain in lower buttocks into posterior legs--when up on feet Hasn't been taking the tylenol regularly--just at bedtime  Current Outpatient Prescriptions on File Prior to Visit  Medication Sig Dispense Refill  . acetaminophen (TYLENOL) 650 MG CR tablet Take 650 mg by mouth 3 (three) times daily.      Marland Kitchen albuterol (PROVENTIL HFA;VENTOLIN HFA) 108 (90 BASE) MCG/ACT inhaler Inhale 2 puffs into the lungs every 4 (four) hours as needed for shortness of breath.      Marland Kitchen aspirin 81 MG tablet Take 81 mg by mouth daily.        . Azelastine HCl (ASTEPRO) 0.15 % SOLN USE 2 SPRAYS IN EACH NOSTRIL TWICE A DAY  90 mL  1  . B Complex-C (SUPER B COMPLEX PO) Take 1 tablet by mouth daily.      Marland Kitchen buPROPion (WELLBUTRIN XL) 300 MG 24 hr tablet Take 1 tablet by mouth daily       . cetirizine (ZYRTEC) 10 MG tablet Take 10 mg by mouth daily.      . Coenzyme Q10 (CO Q-10) 50 MG CAPS Take 50 mg by mouth daily.      Marland Kitchen escitalopram (LEXAPRO) 20 MG tablet Take 40 mg by mouth daily.       . montelukast (SINGULAIR) 10 MG tablet Take 1 tablet (10 mg total) by mouth at bedtime.  30 tablet  11  . nitroGLYCERIN (NITROSTAT) 0.4 MG SL tablet Place 1 tablet (0.4 mg total) under the tongue every 5 (five) minutes x 3 doses as needed for chest pain.  25 tablet  3  . pyridoxine (B-6) 100 MG tablet Take 100 mg by mouth daily.      Marland Kitchen tiotropium (SPIRIVA HANDIHALER) 18 MCG inhalation  capsule INHALE THE CONTENTS OF 1 CAPSULE VIA HANDIHALER DAILY  90 capsule  1  . verapamil (VERELAN PM) 240 MG 24 hr capsule Take 1 tablet by mouth daily       . vitamin B-12 (CYANOCOBALAMIN) 1000 MCG tablet Take 1,000 mcg by mouth daily.       No current facility-administered medications on file prior to visit.    Allergies  Allergen Reactions  . Penicillins Itching  . Sulfonamide Derivatives Other (See Comments)    "it affected my kidneys"    Past Medical History  Diagnosis Date  . Anemia   . Anxiety   . Atrial fibrillation   . CAD (coronary artery disease)   . Hyperlipidemia   . COPD (chronic obstructive pulmonary disease)   . Allergy   . Osteoarthritis   . Hx of colonic polyps   . Hyperplastic polyp of intestine 2001  . Adenomatous polyp 2001  . Hypertension 12/25/11    pt denies this history  . Myocardial infarction 06/2010    "twice; 1 day apart; during/after cath"  . Exertional dyspnea   . S/P appy  Past Surgical History  Procedure Laterality Date  . Appendectomy    . Carpal tunnel release      left  . Mastoidectomy      x3 on right  . Pilonidal cyst / sinus excision    . Knee arthroscopy      left, right knee 10/2009  . Shoulder arthroscopy      right  . Foot fracture surgery      right heel repair  . Nasal septum surgery      nasal septal repair  . Penile prosthesis implant      x 2--got infection after 2nd and had to remove  . Cataract extraction w/ intraocular lens implant  2/13    Dr [name]  . Carpal tunnel release  10/11/11    Dr Rush Barer  . Tonsillectomy and adenoidectomy    . Fracture surgery    . Cardiac catheterization  2012    50% LAD and luminal irreg in RCA, 1/12  Post cath MI from damage  . Penile prosthesis  removal      Family History  Problem Relation Age of Onset  . Diabetes Neg Hx   . Hypertension Neg Hx   . Heart attack Father   . Cancer Mother     ? leukemia    History   Social History  . Marital  Status: Married    Spouse Name: N/A    Number of Children: 4  . Years of Education: N/A   Occupational History  . retired Chief Strategy Officer the job trainin)    Social History Main Topics  . Smoking status: Former Smoker -- 2.50 packs/day for 30 years    Types: Cigarettes    Quit date: 06/11/1978  . Smokeless tobacco: Never Used  . Alcohol Use: Yes     Comment: QUIT [id number] alcoholic  . Drug Use: No  . Sexually Active: Not Currently   Other Topics Concern  . Not on file   Social History Narrative   Counselling psychologist (on the job training)   Grew up in [place]   Married---2nd   2 children ([place], [place]  and near [place])--2 stepchildren   Former Smoker--quit in 1980   Alcohol use-no. Quit in 1980--alcoholic   Has living will. Has designated wife, then step daughter [name] have health care POA.   Would accept resuscitation attempts.   No feeding tube if cognitively unaware.         Review of Systems No fever Not sick Appetite is okay    Objective:   Physical Exam  Constitutional: He appears well-developed and well-nourished. No distress.  Cardiovascular: Intact distal pulses.   Neurological:  Mild decreased strength in right leg--flexion at hip and knee only Gait is normal          Assessment & Plan:

## 2012-12-31 ENCOUNTER — Encounter: Payer: Self-pay | Admitting: Internal Medicine

## 2013-01-09 ENCOUNTER — Encounter: Payer: Self-pay | Admitting: Internal Medicine

## 2013-01-27 ENCOUNTER — Other Ambulatory Visit: Payer: Self-pay | Admitting: *Deleted

## 2013-01-27 MED ORDER — TIOTROPIUM BROMIDE MONOHYDRATE 18 MCG IN CAPS: ORAL_CAPSULE | RESPIRATORY_TRACT | Status: DC

## 2013-02-06 ENCOUNTER — Ambulatory Visit (INDEPENDENT_AMBULATORY_CARE_PROVIDER_SITE_OTHER): Payer: Medicare Other | Admitting: Emergency Medicine

## 2013-02-06 ENCOUNTER — Encounter: Payer: Self-pay | Admitting: Emergency Medicine

## 2013-02-06 ENCOUNTER — Ambulatory Visit: Payer: Medicare Other | Admitting: Emergency Medicine

## 2013-02-06 VITALS — BP 140/80 | HR 106 | Ht 64.0 in | Wt 169.4 lb

## 2013-02-06 DIAGNOSIS — J4489 Other specified chronic obstructive pulmonary disease: Secondary | ICD-10-CM

## 2013-02-06 DIAGNOSIS — J449 Chronic obstructive pulmonary disease, unspecified: Secondary | ICD-10-CM

## 2013-02-06 DIAGNOSIS — R059 Cough, unspecified: Secondary | ICD-10-CM

## 2013-02-06 DIAGNOSIS — R05 Cough: Secondary | ICD-10-CM

## 2013-02-06 DIAGNOSIS — J309 Allergic rhinitis, unspecified: Secondary | ICD-10-CM

## 2013-02-06 MED ORDER — TIOTROPIUM BROMIDE MONOHYDRATE 18 MCG IN CAPS: ORAL_CAPSULE | RESPIRATORY_TRACT | Status: DC

## 2013-02-06 NOTE — Assessment & Plan Note (Signed)
-   still believe that GERD could be a part of this. Will defer PPi for now so we can see if current interventions are effective.

## 2013-02-06 NOTE — Assessment & Plan Note (Signed)
Difficult to determine whether this is being driven by rhinitis or by his COPD. His drainage is severe, but do not believe it should make him worse on 6 minute walk.  - add Breo to Spiriva for a month - albuterol prn

## 2013-02-06 NOTE — Progress Notes (Signed)
Subjective:    Patient ID: Eduardo [name], male    DOB: 08-Jul-1930, 79 y.o.   MRN: [id number] HPI 79 yo man, former smoker, CAD s/p MI 1/12, A Fib, allergies, exertional SOB for a few yrs. This has been worse since recent hosp in 1/12 - was cathed x 2, no intervention made, course c/b renal failure. 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 No hypoxemia reported with exercise. Was sent from the hospital on metoprolol (new). He has heard wheezing, especially when nasal allergies. Arlyce Harman at Dr Alla German office suggested AFL/COPD.   ROV 09/22/10 -- f/u SOB due to presumed COPD, CAD/MI, A Fib. Last time we started prn SABA to see if he would benefit - may have helped him some. He had full PFT's (09/18/10) . Dr Tamala Julian changed his metoprolol to verapamil but no change in breathing; he did get LE edema. He continues to have wheeze, cough, DOE. Also currently having severe PND and feels that it is contributing to his trouble. Taking allegra qd.  ROV 11/15/10 -- follow up for multifactorial dyspnea, CAD, probable COPD, allergic rhinitis. Returns today after we started Symbicort at last visit. He didn't fel that the SymbicorContinues to do Rehab at [place]. The therapists were concerned that he was having exertional dyspnea, having to stop during workouts. Over last few days more allergy sx, on fexafenadine.   ROV 12/15/10 -- probable COPD, CAD, allergic rhinitis. He didn't improve w Symbicort, so we started Spiriva last visit. He believes that his breathing is a bit better. He has done better at Rehab on eliptical and treadmill. No hypoxemia. He is taking both allegra and zyrtec for allergies, as well as nasonex - still has severe PND and associated cough.   ROB 01/29/11 -- probable COPD, CAD, allergic rhinitis. Last time we discussed change from nasal steroid to astelin + NSW + zyrtec. He did not make the change. He isn't having the same degree of cough, but still with nasal gtt. Remains on Spiriva and  doing well.   ROV 05/14/11 -- probable COPD, CAD, allergic rhinitis. Returns for f/u. Last time changed nasal steroid to astepro. Still has significant nasal gtt, wheeze in the am, clearing mucous from throat. Arlyce Harman 4/12 was not impressive for significant AFL.   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 Was recently hospitalized with acute on chronic anemia while on coumadin. Also some degree of diastolic CHF and pulmonary edema. Feels much stronger, breathing is better. Occult blood negative. Fe started during the hosp.   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 Also hx chronic anemia on Fe. Returns f/u. He reports that his last Hgb w Dr Tamala Julian was 38. He is able to exert, but has some DOE after a block. Remains on Spiriva, uses SABA prn a couple times a day.  Lots of mucous and sinus drainage - taking zyrtec, NSW, astepro. Doing NSW before bed and then getting drainage to throat.   ROV 11/07/12 -- COPD, CAD, allergic rhinitis, chronic anemia. He reports that he has been having more problems at night - feels a drainage, a globus sensation, wheezing, coughing. No real snoring. He tries to clear his throat but there is nothing there. He stopped doing nasal saline washes months ago, still on zyrtec, astepro. Dr [name] added singulair recently.   ROV 02/06/13 -- COPD, CAD, allergic rhinitis, chronic anemia. He is still having trouble with cough especially at night. Has been doing  nasal saline at night. He had a 6 minute walk 8/28, walked shorter distance than he did previously, feels more SOB, has neuropathic pain. He is on spiriva, he is using albuterol 2x a day. Nasal congestion - on astepro qhs  Review of Systems As per HPI    Objective:  Physical Exam Filed Vitals:   02/06/13 1625  BP: 140/80  Pulse: 106  Height: 5\' 4"  (1.626 m)  Weight: 169 lb 6.4 oz (76.839 kg)  SpO2: 97%   Gen: Pleasant, well-nourished, in no distress,   normal affect  ENT: No lesions,  mouth clear,  oropharynx clear, no postnasal drip  Neck: No JVD, no TMG, no carotid bruits  Lungs: No use of accessory muscles, clear without rales or rhonchi  Cardiovascular: RRR, heart sounds normal, no murmur or gallops, no peripheral edema  Musculoskeletal: No deformities, no cyanosis or clubbing  Neuro: alert, non focal  Skin: Warm, no lesions or rashes  Assessment & Plan:  COPD Difficult to determine whether this is being driven by rhinitis or by his COPD. His drainage is severe, but do not believe it should make him worse on 6 minute walk.  - add Breo to Spiriva for a month - albuterol prn  ALLERGIC RHINITIS - change nasal saline to qam - increase freq of astepro - ? Add back nasal steroid in the future  Cough - still believe that GERD could be a part of this. Will defer PPi for now so we can see if current interventions are effective.

## 2013-02-06 NOTE — Patient Instructions (Addendum)
Continue Spiriva daily Start Breo once a day Use albuterol as needed Change the timing of nasal saline to the morning Use the astpro nasal spray up to 3x a day if needed for drainage.  Follow with Dr Lamonte Sakai in 1 month

## 2013-02-06 NOTE — Assessment & Plan Note (Signed)
-   change nasal saline to qam - increase freq of astepro - ? Add back nasal steroid in the future

## 2013-02-09 ENCOUNTER — Encounter: Payer: Self-pay | Admitting: Internal Medicine

## 2013-02-24 ENCOUNTER — Other Ambulatory Visit: Payer: Self-pay | Admitting: Interventional Cardiology

## 2013-02-24 DIAGNOSIS — E782 Mixed hyperlipidemia: Secondary | ICD-10-CM

## 2013-03-05 ENCOUNTER — Ambulatory Visit (INDEPENDENT_AMBULATORY_CARE_PROVIDER_SITE_OTHER): Payer: Medicare Other | Admitting: Emergency Medicine

## 2013-03-05 ENCOUNTER — Encounter: Payer: Self-pay | Admitting: Emergency Medicine

## 2013-03-05 VITALS — BP 138/80 | HR 74 | Temp 98.2°F | Ht 63.0 in | Wt 166.6 lb

## 2013-03-05 DIAGNOSIS — Z23 Encounter for immunization: Secondary | ICD-10-CM

## 2013-03-05 DIAGNOSIS — J449 Chronic obstructive pulmonary disease, unspecified: Secondary | ICD-10-CM

## 2013-03-05 DIAGNOSIS — R059 Cough, unspecified: Secondary | ICD-10-CM

## 2013-03-05 DIAGNOSIS — R05 Cough: Secondary | ICD-10-CM

## 2013-03-05 DIAGNOSIS — J4489 Other specified chronic obstructive pulmonary disease: Secondary | ICD-10-CM

## 2013-03-05 NOTE — Assessment & Plan Note (Addendum)
Continue same routine > nsw, astelin, singulair, zyrtec

## 2013-03-05 NOTE — Progress Notes (Signed)
Subjective:    Patient ID: Eduardo [name], male    DOB: 1930/10/31, 79 y.o.   MRN: [id number] HPI 79 yo man, former smoker, CAD s/p MI 1/12, A Fib, allergies, exertional SOB for a few yrs. This has been worse since recent hosp in 1/12 - was cathed x 2, no intervention made, course c/b renal failure. 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 No hypoxemia reported with exercise. Was sent from the hospital on metoprolol (new). He has heard wheezing, especially when nasal allergies. Arlyce Harman at Dr Alla German office suggested AFL/COPD.   ROV 09/22/10 -- f/u SOB due to presumed COPD, CAD/MI, A Fib. Last time we started prn SABA to see if he would benefit - may have helped him some. He had full PFT's (09/18/10) . Dr Tamala Julian changed his metoprolol to verapamil but no change in breathing; he did get LE edema. He continues to have wheeze, cough, DOE. Also currently having severe PND and feels that it is contributing to his trouble. Taking allegra qd.  ROV 11/15/10 -- follow up for multifactorial dyspnea, CAD, probable COPD, allergic rhinitis. Returns today after we started Symbicort at last visit. He didn't fel that the SymbicorContinues to do Rehab at [place]. The therapists were concerned that he was having exertional dyspnea, having to stop during workouts. Over last few days more allergy sx, on fexafenadine.   ROV 12/15/10 -- probable COPD, CAD, allergic rhinitis. He didn't improve w Symbicort, so we started Spiriva last visit. He believes that his breathing is a bit better. He has done better at Rehab on eliptical and treadmill. No hypoxemia. He is taking both allegra and zyrtec for allergies, as well as nasonex - still has severe PND and associated cough.   ROB 01/29/11 -- probable COPD, CAD, allergic rhinitis. Last time we discussed change from nasal steroid to astelin + NSW + zyrtec. He did not make the change. He isn't having the same degree of cough, but still with nasal gtt. Remains on Spiriva and  doing well.   ROV 05/14/11 -- probable COPD, CAD, allergic rhinitis. Returns for f/u. Last time changed nasal steroid to astepro. Still has significant nasal gtt, wheeze in the am, clearing mucous from throat. Arlyce Harman 4/12 was not impressive for significant AFL.   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 Was recently hospitalized with acute on chronic anemia while on coumadin. Also some degree of diastolic CHF and pulmonary edema. Feels much stronger, breathing is better. Occult blood negative. Fe started during the hosp.   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 Also hx chronic anemia on Fe. Returns f/u. He reports that his last Hgb w Dr Tamala Julian was 5. He is able to exert, but has some DOE after a block. Remains on Spiriva, uses SABA prn a couple times a day.  Lots of mucous and sinus drainage - taking zyrtec, NSW, astepro. Doing NSW before bed and then getting drainage to throat.   ROV 11/07/12 -- COPD, CAD, allergic rhinitis, chronic anemia. He reports that he has been having more problems at night - feels a drainage, a globus sensation, wheezing, coughing. No real snoring. He tries to clear his throat but there is nothing there. He stopped doing nasal saline washes months ago, still on zyrtec, astepro. Dr [name] added singulair recently.   ROV 02/06/13 -- COPD, CAD, allergic rhinitis, chronic anemia. He is still having trouble with cough especially at night. Has been doing  nasal saline at night. He had a 6 minute walk 8/28, walked shorter distance than he did previously, feels more SOB, has neuropathic pain. He is on spiriva, he is using albuterol 2x a day. Nasal congestion - on astepro qhs  ROV 03/05/13 -- COPD, CAD, allergic rhinitis, chronic anemia. Last time he was having more dyspnea, more cough. We tried adding Breo to Spiriva to see if he would benefit > his dyspnea and wheezing are both better. We discussed increasing  astepro but he hasn't needed to. He changed NSW to qam from bedtime.   Review of Systems As per HPI    Objective:  Physical Exam Filed Vitals:   03/05/13 1048  BP: 138/80  Pulse: 74  Temp: 98.2 F (36.8 C)  TempSrc: Oral  Height: 5\' 3"  (1.6 m)  Weight: 166 lb 9.6 oz (75.569 kg)  SpO2: 96%   Gen: Pleasant, well-nourished, in no distress,  normal affect  ENT: No lesions,  mouth clear,  oropharynx clear, no postnasal drip  Neck: No JVD, no TMG, no carotid bruits  Lungs: No use of accessory muscles, clear without rales or rhonchi  Cardiovascular: RRR, heart sounds normal, no murmur or gallops, no peripheral edema  Musculoskeletal: No deformities, no cyanosis or clubbing  Neuro: alert, non focal  Skin: Warm, no lesions or rashes  Assessment & Plan:  COPD We will continue breo + spiriva as he has benefited   Cough Continue same routine > nsw, astelin, singulair, zyrtec

## 2013-03-05 NOTE — Patient Instructions (Addendum)
Please continue your Breo and Spiriva Flu Shot today Please continue your nasal saline, astelin spray, singulair and zyrtec as you are taking them.  Follow with Dr Lamonte Sakai in 6 months or sooner if you have any problems

## 2013-03-05 NOTE — Assessment & Plan Note (Signed)
We will continue breo + spiriva as he has benefited

## 2013-03-11 ENCOUNTER — Encounter: Payer: Self-pay | Admitting: Internal Medicine

## 2013-03-16 ENCOUNTER — Ambulatory Visit (INDEPENDENT_AMBULATORY_CARE_PROVIDER_SITE_OTHER): Payer: Medicare Other | Admitting: Internal Medicine

## 2013-03-16 ENCOUNTER — Encounter: Payer: Self-pay | Admitting: Internal Medicine

## 2013-03-16 VITALS — BP 140/80 | HR 88 | Temp 97.7°F | Wt 164.0 lb

## 2013-03-16 DIAGNOSIS — Z0000 Encounter for general adult medical examination without abnormal findings: Secondary | ICD-10-CM

## 2013-03-16 DIAGNOSIS — J449 Chronic obstructive pulmonary disease, unspecified: Secondary | ICD-10-CM

## 2013-03-16 DIAGNOSIS — I4891 Unspecified atrial fibrillation: Secondary | ICD-10-CM

## 2013-03-16 DIAGNOSIS — I2510 Atherosclerotic heart disease of native coronary artery without angina pectoris: Secondary | ICD-10-CM

## 2013-03-16 DIAGNOSIS — E785 Hyperlipidemia, unspecified: Secondary | ICD-10-CM

## 2013-03-16 DIAGNOSIS — F411 Generalized anxiety disorder: Secondary | ICD-10-CM

## 2013-03-16 LAB — CBC WITH DIFFERENTIAL/PLATELET
Basophils Relative: 0.6 % (ref 0.0–3.0)
Eosinophils Absolute: 0.4 10*3/uL (ref 0.0–0.7)
Eosinophils Relative: 4.9 % (ref 0.0–5.0)
HCT: 39.1 % (ref 39.0–52.0)
Hemoglobin: 13.1 g/dL (ref 13.0–17.0)
Lymphs Abs: 1.7 10*3/uL (ref 0.7–4.0)
MCHC: 33.5 g/dL (ref 30.0–36.0)
MCV: 87.8 fl (ref 78.0–100.0)
Monocytes Relative: 7 % (ref 3.0–12.0)
Neutro Abs: 4.9 10*3/uL (ref 1.4–7.7)
Platelets: 217 10*3/uL (ref 150.0–400.0)
RDW: 14.1 % (ref 11.5–14.6)
WBC: 7.5 10*3/uL (ref 4.5–10.5)

## 2013-03-16 LAB — BASIC METABOLIC PANEL
Calcium: 9.5 mg/dL (ref 8.4–10.5)
Creatinine, Ser: 0.9 mg/dL (ref 0.4–1.5)
GFR: 83.11 mL/min (ref >60.00)
Glucose, Bld: 103 mg/dL — ABNORMAL HIGH (ref 70–99)
Potassium: 4.2 mEq/L (ref 3.5–5.1)
Sodium: 138 mEq/L (ref 135–145)

## 2013-03-16 LAB — HEPATIC FUNCTION PANEL
AST: 36 U/L (ref 0–37)
Albumin: 4.1 g/dL (ref 3.5–5.2)
Alkaline Phosphatase: 56 U/L (ref 39–117)
Total Protein: 6.9 g/dL (ref 6.0–8.3)

## 2013-03-16 LAB — LIPID PANEL: HDL: 34.3 mg/dL — ABNORMAL LOW (ref >39.00)

## 2013-03-16 LAB — LDL CHOLESTEROL, DIRECT: Direct LDL: 158.6 mg/dL

## 2013-03-16 NOTE — Progress Notes (Signed)
Subjective:    Patient ID: Eduardo [name], male    DOB: 1931/01/01, 79 y.o.   MRN: [id number]  HPI Here for physical Feels he is doing better Not coughing much now (had kept him up and made him depressed) Feels his meds now controlling this  Still with arthritis pain--mostly in AM in hands Still walks with a cane---will use wheelchair for long distances Balance problems with unlevel ground-- cane helps No falls this year Tries to exercise regularly Still working with physical therapist  No heart problems No palpitations No chest pain  No change in DOE  Current Outpatient Prescriptions on File Prior to Visit  Medication Sig Dispense Refill  . acetaminophen (TYLENOL) 650 MG CR tablet Take 650 mg by mouth 3 (three) times daily.      Marland Kitchen albuterol (PROVENTIL HFA;VENTOLIN HFA) 108 (90 BASE) MCG/ACT inhaler Inhale 2 puffs into the lungs every 4 (four) hours as needed for shortness of breath.      Marland Kitchen aspirin 81 MG tablet Take 81 mg by mouth daily.        . Azelastine HCl (ASTEPRO) 0.15 % SOLN USE 2 SPRAYS IN EACH NOSTRIL TWICE A DAY  90 mL  1  . B Complex-C (SUPER B COMPLEX PO) Take 1 tablet by mouth daily.      Marland Kitchen buPROPion (WELLBUTRIN XL) 300 MG 24 hr tablet Take 1 tablet by mouth daily       . cetirizine (ZYRTEC) 10 MG tablet Take 10 mg by mouth daily.      . Coenzyme Q10 (CO Q-10) 50 MG CAPS Take 50 mg by mouth daily.      Marland Kitchen escitalopram (LEXAPRO) 20 MG tablet Take 40 mg by mouth daily.       . fenofibrate 160 MG tablet Take 160 mg by mouth daily.      . Fluticasone Furoate-Vilanterol (BREO ELLIPTA) 100-25 MCG/INH AEPB Inhale 1 puff into the lungs daily.      . montelukast (SINGULAIR) 10 MG tablet Take 1 tablet (10 mg total) by mouth at bedtime.  30 tablet  11  . Multiple Vitamin (MULTIVITAMIN) tablet Take 1 tablet by mouth daily.      Marland Kitchen pyridoxine (B-6) 100 MG tablet Take 100 mg by mouth daily.      Marland Kitchen tiotropium (SPIRIVA HANDIHALER) 18 MCG inhalation capsule INHALE THE CONTENTS  OF 1 CAPSULE VIA HANDIHALER DAILY  90 capsule  3  . verapamil (VERELAN PM) 240 MG 24 hr capsule Take 1 tablet by mouth daily       . vitamin B-12 (CYANOCOBALAMIN) 1000 MCG tablet Take 1,000 mcg by mouth daily.       No current facility-administered medications on file prior to visit.    Allergies  Allergen Reactions  . Penicillins Itching  . Sulfonamide Derivatives Other (See Comments)    "it affected my kidneys"    Past Medical History  Diagnosis Date  . Anemia   . Anxiety   . Atrial fibrillation   . CAD (coronary artery disease)   . Hyperlipidemia   . COPD (chronic obstructive pulmonary disease)   . Allergy   . Osteoarthritis   . Hx of colonic polyps   . Hyperplastic polyp of intestine 2001  . Adenomatous polyp 2001  . Hypertension 12/25/11    pt denies this history  . Myocardial infarction 06/2010    "twice; 1 day apart; during/after cath"  . Exertional dyspnea   . S/P appy     Past Surgical History  Procedure Laterality Date  . Appendectomy    . Carpal tunnel release      left  . Mastoidectomy      x3 on right  . Pilonidal cyst / sinus excision    . Knee arthroscopy      left, right knee 10/2009  . Shoulder arthroscopy      right  . Foot fracture surgery      right heel repair  . Nasal septum surgery      nasal septal repair  . Penile prosthesis implant      x 2--got infection after 2nd and had to remove  . Cataract extraction w/ intraocular lens implant  2/13    Dr [name]  . Carpal tunnel release  10/11/11    Dr Rush Barer  . Tonsillectomy and adenoidectomy    . Fracture surgery    . Cardiac catheterization  2012    50% LAD and luminal irreg in RCA, 1/12  Post cath MI from damage  . Penile prosthesis  removal      Family History  Problem Relation Age of Onset  . Diabetes Neg Hx   . Hypertension Neg Hx   . Heart attack Father   . Cancer Mother     ? leukemia    History   Social History  . Marital Status: Married    Spouse Name:  N/A    Number of Children: 4  . Years of Education: N/A   Occupational History  . Retired Chief Strategy Officer the job trainin)    Social History Main Topics  . Smoking status: Former Smoker -- 2.50 packs/day for 30 years    Types: Cigarettes    Quit date: 06/11/1978  . Smokeless tobacco: Never Used  . Alcohol Use: Yes     Comment: QUIT [id number] alcoholic  . Drug Use: No  . Sexual Activity: Not Currently   Other Topics Concern  . Not on file   Social History Narrative   Grew up in [place]   Married---2nd   2 children ([place], [place]  and near [place])--2 stepchildren   Former Smoker--quit in 1980   Alcohol use-no. Quit in 1980--alcoholic      Has living will.   Has designated wife, then step daughter [name] have health care POA.   Would accept resuscitation attempts.   No feeding tube if cognitively unaware.            Review of Systems  Constitutional: Negative for fatigue and unexpected weight change.       Wears seat belt  HENT: Positive for hearing loss, congestion and rhinorrhea. Negative for dental problem and tinnitus.        Partial on top---regular with dentist  Eyes: Negative for visual disturbance.       No diplopia or unilateral vision loss  Respiratory: Positive for shortness of breath. Negative for cough and chest tightness.   Cardiovascular: Positive for leg swelling. Negative for chest pain and palpitations.       Slight swelling ---socks indent calf a little  Gastrointestinal: Negative for nausea, vomiting, abdominal pain, constipation and blood in stool.       No heartburn  Endocrine: Negative for cold intolerance and heat intolerance.  Genitourinary: Negative for urgency, frequency and difficulty urinating.       Nocturia x 1-2  Musculoskeletal: Positive for back pain and arthralgias. Negative for joint swelling.       Acetaminophen helps  Skin: Negative for rash.  Recent visit with dermatologist Several lesions treated   Allergic/Immunologic: Positive for environmental allergies. Negative for immunocompromised state.       Mild symptoms---no meds  Neurological: Positive for numbness. Negative for dizziness, syncope, weakness, light-headedness and headaches.       Decreased sensation in feet  Hematological: Negative for adenopathy. Bruises/bleeds easily.  Psychiatric/Behavioral: Negative for sleep disturbance and dysphoric mood. The patient is not nervous/anxious.        Objective:   Physical Exam  Constitutional: He is oriented to person, place, and time. He appears well-developed and well-nourished. No distress.  HENT:  Head: Normocephalic and atraumatic.  Right Ear: External ear normal.  Left Ear: External ear normal.  Mouth/Throat: Oropharynx is clear and moist. No oropharyngeal exudate.  Eyes: Conjunctivae and EOM are normal. Pupils are equal, round, and reactive to light.  Neck: Normal range of motion. Neck supple. No thyromegaly present.  Cardiovascular: Normal rate, normal heart sounds and intact distal pulses.  Exam reveals no gallop.   No murmur heard. irregular  Pulmonary/Chest: Effort normal and breath sounds normal. No respiratory distress. He has no wheezes. He has no rales.  Abdominal: Soft. There is no tenderness.  Musculoskeletal: He exhibits no edema and no tenderness.  Lymphadenopathy:    He has no cervical adenopathy.  Neurological: He is alert and oriented to person, place, and time.  Skin: No rash noted. No erythema.  Psychiatric: He has a normal mood and affect. His behavior is normal.          Assessment & Plan:

## 2013-03-16 NOTE — Assessment & Plan Note (Signed)
Stable DOE Cough and wheezing gone He is satisfied

## 2013-03-16 NOTE — Assessment & Plan Note (Signed)
Good rate control ASA only due to severe bleeding/anemia in past

## 2013-03-16 NOTE — Assessment & Plan Note (Signed)
Fenofibrate recently started for high triglycerides Will recheck labs

## 2013-03-16 NOTE — Assessment & Plan Note (Signed)
Doing well on meds No changes

## 2013-03-16 NOTE — Assessment & Plan Note (Signed)
MI associated with severe anemia Doing well now

## 2013-03-16 NOTE — Assessment & Plan Note (Signed)
Doing well Stable DOE UTD on imms Tries to exercise regularly

## 2013-03-20 ENCOUNTER — Telehealth: Payer: Self-pay | Admitting: Emergency Medicine

## 2013-03-20 MED ORDER — FLUTICASONE FUROATE-VILANTEROL 100-25 MCG/INH IN AEPB: 1.0000 | INHALATION_SPRAY | Freq: Every day | RESPIRATORY_TRACT | Status: DC

## 2013-03-20 NOTE — Telephone Encounter (Signed)
Rx was sent and pt aware

## 2013-03-27 ENCOUNTER — Telehealth: Payer: Self-pay | Admitting: Emergency Medicine

## 2013-03-27 NOTE — Telephone Encounter (Signed)
Pt states he rec'd his mail order for Breo from [facility] today, so sample(s) is no longer needed.  Pt states nothing further is needed at this time.  Satira Anis

## 2013-03-27 NOTE — Telephone Encounter (Signed)
Pt called back requesting we call him @ [phone] if he doesn't answer his home #. Eduardo [name]

## 2013-04-03 ENCOUNTER — Ambulatory Visit (INDEPENDENT_AMBULATORY_CARE_PROVIDER_SITE_OTHER): Payer: Medicare Other | Admitting: Interventional Cardiology

## 2013-04-03 ENCOUNTER — Encounter: Payer: Self-pay | Admitting: Interventional Cardiology

## 2013-04-03 VITALS — BP 130/70 | HR 80 | Ht 63.0 in | Wt 164.8 lb

## 2013-04-03 DIAGNOSIS — I5032 Chronic diastolic (congestive) heart failure: Secondary | ICD-10-CM

## 2013-04-03 DIAGNOSIS — J449 Chronic obstructive pulmonary disease, unspecified: Secondary | ICD-10-CM

## 2013-04-03 DIAGNOSIS — I10 Essential (primary) hypertension: Secondary | ICD-10-CM

## 2013-04-03 DIAGNOSIS — I4891 Unspecified atrial fibrillation: Secondary | ICD-10-CM

## 2013-04-03 DIAGNOSIS — I2510 Atherosclerotic heart disease of native coronary artery without angina pectoris: Secondary | ICD-10-CM

## 2013-04-03 NOTE — Progress Notes (Signed)
Patient ID: Eduardo [name], male   DOB: 06-18-1930, 79 y.o.   MRN: [id number]    [address], [address] [place], [place]  [address] Phone: [phone] Fax:  [phone]  Date:  04/03/2013   ID:  Eduardo [name], DOB [birth year], MRN [id number]  PCP:  [name], MD   ASSESSMENT:  1. Chronic atrial fibrillation with rate control  2. Coronary artery disease with stable angina pectoris  3. Chronic diastolic heart failure, stable but with some limitation  4. COPD, a significant comorbidity  5. Not on Coumadin therapy due to significant prior bleeding episodes  6. Hypertension currently under control  PLAN:  1. continue current medical regimen  2. I encouraged physical activity to help maintain strength and mobility  3. Clinical followup with me in one year.   SUBJECTIVE: Eduardo [name] is a 79 y.o. male is feeling the best he has in several years. Wheezing is improved because of the new medication started by his pulmonologist. He has not had angina. He and his wife were happy that he is off of anticoagulation therapy. No genital bleeding or falls. He denies orthopnea. There is significant exertional dyspnea. Appetite is good. He needs encouragement to stay active.   Wt Readings from Last 3 Encounters:  04/03/13 164 lb 12.8 oz (74.753 kg)  03/16/13 164 lb (74.39 kg)  03/05/13 166 lb 9.6 oz (75.569 kg)     Past Medical History  Diagnosis Date  . Anemia   . Anxiety   . Atrial fibrillation   . CAD (coronary artery disease)   . Hyperlipidemia   . COPD (chronic obstructive pulmonary disease)   . Allergy   . Osteoarthritis   . Hx of colonic polyps   . Hyperplastic polyp of intestine 2001  . Adenomatous polyp 2001  . Hypertension 12/25/11    pt denies this history  . Myocardial infarction 06/2010    "twice; 1 day apart; during/after cath"  . Exertional dyspnea   . S/P appy     Current Outpatient Prescriptions  Medication Sig Dispense Refill  .  acetaminophen (TYLENOL) 650 MG CR tablet Take 650 mg by mouth 3 (three) times daily.      Marland Kitchen albuterol (PROVENTIL HFA;VENTOLIN HFA) 108 (90 BASE) MCG/ACT inhaler Inhale 2 puffs into the lungs every 4 (four) hours as needed for shortness of breath.      Marland Kitchen aspirin 81 MG tablet Take 81 mg by mouth daily.        . Azelastine HCl (ASTEPRO) 0.15 % SOLN USE 2 SPRAYS IN EACH NOSTRIL TWICE A DAY  90 mL  1  . B Complex-C (SUPER B COMPLEX PO) Take 1 tablet by mouth daily.      Marland Kitchen buPROPion (WELLBUTRIN XL) 300 MG 24 hr tablet Take 1 tablet by mouth daily       . cetirizine (ZYRTEC) 10 MG tablet Take 10 mg by mouth daily.      . Coenzyme Q10 (CO Q-10) 50 MG CAPS Take 50 mg by mouth daily.      Marland Kitchen escitalopram (LEXAPRO) 20 MG tablet Take 40 mg by mouth daily.       . fenofibrate 160 MG tablet Take 160 mg by mouth daily.      . Fluticasone Furoate-Vilanterol (BREO ELLIPTA) 100-25 MCG/INH AEPB Inhale 1 puff into the lungs daily.  3 each  3  . montelukast (SINGULAIR) 10 MG tablet Take 1 tablet (10 mg total) by mouth at bedtime.  [place]  tablet  11  . Multiple Vitamin (MULTIVITAMIN) tablet Take 1 tablet by mouth daily.      Marland Kitchen pyridoxine (B-6) 100 MG tablet Take 100 mg by mouth daily.      Marland Kitchen tiotropium (SPIRIVA HANDIHALER) 18 MCG inhalation capsule INHALE THE CONTENTS OF 1 CAPSULE VIA HANDIHALER DAILY  90 capsule  3  . verapamil (VERELAN PM) 240 MG 24 hr capsule Take 1 tablet by mouth daily       . vitamin B-12 (CYANOCOBALAMIN) 1000 MCG tablet Take 1,000 mcg by mouth daily.       No current facility-administered medications for this visit.    Allergies:    Allergies  Allergen Reactions  . Penicillins Itching  . Sulfonamide Derivatives Other (See Comments)    "it affected my kidneys"    Social History:  The patient  reports that he quit smoking about 34 years ago. His smoking use included Cigarettes. He has a 75 pack-year smoking history. He has never used smokeless tobacco. He reports that he drinks alcohol. He  reports that he does not use illicit drugs.   ROS:  Please see the history of present illness.      All other systems reviewed and negative.   OBJECTIVE: VS:  BP 130/70  Pulse 80  Ht 5\' 3"  (1.6 m)  Wt 164 lb 12.8 oz (74.753 kg)  BMI 29.2 kg/m2 Well nourished, well developed, in no acute distress, moderately obese HEENT: normal Neck: JVD flat. Carotid bruit absent  Cardiac:  normal S1, S2; RRR; no murmur Lungs:  clear to auscultation bilaterally, no wheezing, rhonchi or rales Abd: soft, nontender, no hepatomegaly Ext: Edema  Trace bilateral . Pulses Present  Skin: warm and dry Neuro:  CNs 2-12 intact, no focal abnormalities noted  EKG:  Atrial fibrillation with controlled ventricular response. Interventricular conduction delay. No change from prior tracings.       Signed, Illene Labrador III, MD 04/03/2013 9:40 AM

## 2013-04-03 NOTE — Patient Instructions (Signed)
Your physician recommends that you continue on your current medications as directed. Please refer to the Current Medication list given to you today.  Your physician wants you to follow-up in: 1 year. You will receive a reminder letter in the mail two months in advance. If you don't receive a letter, please call our office to schedule the follow-up appointment.  

## 2013-04-11 ENCOUNTER — Encounter: Payer: Self-pay | Admitting: Internal Medicine

## 2013-04-24 ENCOUNTER — Encounter: Payer: Medicare Other | Admitting: Internal Medicine

## 2013-05-26 ENCOUNTER — Ambulatory Visit (INDEPENDENT_AMBULATORY_CARE_PROVIDER_SITE_OTHER): Payer: Medicare Other

## 2013-05-26 DIAGNOSIS — E782 Mixed hyperlipidemia: Secondary | ICD-10-CM

## 2013-05-26 LAB — LIPID PANEL
Triglycerides: 745 mg/dL — ABNORMAL HIGH (ref 0.0–149.0)
VLDL: 149 mg/dL — ABNORMAL HIGH (ref 0.0–40.0)

## 2013-05-26 LAB — ALT: ALT: 31 U/L (ref 0–53)

## 2013-06-01 ENCOUNTER — Telehealth: Payer: Self-pay

## 2013-06-01 DIAGNOSIS — E785 Hyperlipidemia, unspecified: Secondary | ICD-10-CM

## 2013-06-01 NOTE — Telephone Encounter (Signed)
called to verify what type of statin mediaction pt has been taking.pt sts that he has not been taking his medication because he just didnt want to."he felt he was taking to many medications and just stopped on his own".pt given lab results .advised pt that I would f/u with Ysidro Evert and Dr.Smith on his medication dosage and call him back. Pt verbalized understanding.

## 2013-06-03 MED ORDER — ATORVASTATIN CALCIUM 10 MG PO TABS: 10.0000 mg | ORAL_TABLET | Freq: Every day | ORAL | Status: DC

## 2013-06-03 NOTE — Telephone Encounter (Signed)
pt given Ysidro Evert S.pharm-D instructions. restart fenofibrate 160mg  to be taken daily with largest meal. start liptor 10mg  daily, Rx sent to pt pharmacy. repeat fasting labs appt made 07/16/12 for lipid, hepatic.pt agreeable with plan and verbalized understanding

## 2013-06-03 NOTE — Telephone Encounter (Signed)
Message copied by Lamar Laundry on Wed Jun 03, 2013  8:55 AM ------      Message from: SMART, Maralyn Sago      Created: Mon Jun 01, 2013 12:44 PM       Please see 05/29/13 and let me know if patient taking fenofibrate and/or lipitor. ------

## 2013-07-13 ENCOUNTER — Ambulatory Visit (INDEPENDENT_AMBULATORY_CARE_PROVIDER_SITE_OTHER): Payer: Medicare Other | Admitting: Family Medicine

## 2013-07-13 ENCOUNTER — Encounter: Payer: Self-pay | Admitting: Family Medicine

## 2013-07-13 ENCOUNTER — Ambulatory Visit: Payer: Medicare Other | Admitting: Family Medicine

## 2013-07-13 VITALS — BP 144/64 | HR 90 | Temp 100.4°F | Wt 163.8 lb

## 2013-07-13 DIAGNOSIS — J441 Chronic obstructive pulmonary disease with (acute) exacerbation: Secondary | ICD-10-CM

## 2013-07-13 MED ORDER — ALBUTEROL SULFATE (2.5 MG/3ML) 0.083% IN NEBU
2.5000 mg | INHALATION_SOLUTION | Freq: Once | RESPIRATORY_TRACT | Status: AC
  Administered 2013-07-13: 2.5 mg via RESPIRATORY_TRACT

## 2013-07-13 MED ORDER — PREDNISONE 20 MG PO TABS: ORAL_TABLET | ORAL | Status: DC

## 2013-07-13 MED ORDER — IPRATROPIUM BROMIDE 0.02 % IN SOLN
0.5000 mg | Freq: Once | RESPIRATORY_TRACT | Status: AC
Start: 2013-07-13 — End: 2013-07-13
  Administered 2013-07-13: 0.5 mg via RESPIRATORY_TRACT

## 2013-07-13 MED ORDER — DOXYCYCLINE HYCLATE 100 MG PO CAPS
100.0000 mg | ORAL_CAPSULE | Freq: Two times a day (BID) | ORAL | Status: DC
Start: 2013-07-13 — End: 2013-08-03

## 2013-07-13 NOTE — Patient Instructions (Signed)
You have a COPD exacerbation. Treat with albuterol/atrovent nebulizer in office today as well as may increase albuterol inhaler use to 3 times daily for next 2 days then back down to as needed. Treat with prednisone taper and doxycycline course. May also use over the counter guaifenesin immediate or fast relief with a glass of water to help mobilize mucous. Let us know if not improving as expected or any worsening symptoms such as worsening productive cough, persistent fever >101, or worsening shortness of breath. Good to see you today, I hope you start feeling better.

## 2013-07-13 NOTE — Progress Notes (Signed)
Pre-visit discussion using our clinic review tool. No additional management support is needed unless otherwise documented below in the visit note.  

## 2013-07-13 NOTE — Progress Notes (Signed)
   Subjective:    Patient ID: Eduardo [name], male    DOB: [birth year], 80 y.o.   MRN: [id number]  HPI CC: cough wit hdyspnea.  2d h/o PNDrainage and cough productive of sputum.  Has noticed increased dyspnea from coughing.  Increased wheezing as well as abd soreness from coughing.  Somewhat successful in bringing up phlegm.  Slept on recliner overnight.  Tmax 100.5 s/p tylenol this morning.  In office T 100.4.  On triple therapy for COPD by Dr. Lamonte Sakai (breo, spiriva) as well as singulair and albuterol prn (regularly taking bid). No sick contacts at home.  No smokers at home.  Did receive flu shot this year. Lab Results  Component Value Date   CREATININE 0.9 03/16/2013    Past Medical History  Diagnosis Date  . Anemia   . Anxiety   . Atrial fibrillation   . CAD (coronary artery disease)   . Hyperlipidemia   . COPD (chronic obstructive pulmonary disease)   . Allergy   . Osteoarthritis   . Hx of colonic polyps   . Hyperplastic polyp of intestine 2001  . Adenomatous polyp 2001  . Hypertension 12/25/11    pt denies this history  . Myocardial infarction 06/2010    "twice; 1 day apart; during/after cath"  . Exertional dyspnea   . S/P appy      Review of Systems Per HPI     Objective:   Physical Exam  Nursing note and vitals reviewed. Constitutional: He appears well-developed and well-nourished. No distress.  HENT:  Head: Normocephalic and atraumatic.  Right Ear: Hearing, tympanic membrane, external ear and ear canal normal.  Left Ear: Hearing, tympanic membrane, external ear and ear canal normal.  Nose: Nose normal. No mucosal edema or rhinorrhea.  Mouth/Throat: Uvula is midline, oropharynx is clear and moist and mucous membranes are normal. No oropharyngeal exudate, posterior oropharyngeal edema, posterior oropharyngeal erythema or tonsillar abscesses.  Eyes: Conjunctivae and EOM are normal. Pupils are equal, round, and reactive to light. No scleral icterus.  Neck:  Normal range of motion. Neck supple.  Cardiovascular: Normal rate, regular rhythm, normal heart sounds and intact distal pulses.   No murmur heard. Pulmonary/Chest: Effort normal. No respiratory distress. He has no decreased breath sounds. He has wheezes (exp throughout). He has no rhonchi. He has no rales.  Coarse and somewhat tight Crackles bibasilarly  Musculoskeletal: He exhibits no edema.  Lymphadenopathy:    He has no cervical adenopathy.  Skin: Skin is warm and dry. No rash noted.   After alb/atrovent neb - improved air flow and decreased wheeze, subjective improvement     Assessment & Plan:

## 2013-07-13 NOTE — Assessment & Plan Note (Signed)
Increased cough, sputum production, and dyspnea consistent with COPD exacerbation. Has decreased oxygenation from baseline as well as low grade fever. Will treat with prednisone taper and doxycycline course. Albuterol/atrovent neb provided in office. Discussed more frequent use of albuterol over next 2-3 days. Update if sxs persist or fail to improve.

## 2013-07-15 ENCOUNTER — Telehealth: Payer: Self-pay | Admitting: Emergency Medicine

## 2013-07-15 ENCOUNTER — Other Ambulatory Visit: Payer: Medicare Other

## 2013-07-15 ENCOUNTER — Ambulatory Visit (INDEPENDENT_AMBULATORY_CARE_PROVIDER_SITE_OTHER): Payer: Medicare Other | Admitting: Emergency Medicine

## 2013-07-15 ENCOUNTER — Ambulatory Visit: Payer: Medicare Other | Admitting: Pulmonary Disease

## 2013-07-15 ENCOUNTER — Encounter: Payer: Self-pay | Admitting: Emergency Medicine

## 2013-07-15 VITALS — BP 130/88 | HR 105 | Ht 63.0 in | Wt 169.8 lb

## 2013-07-15 DIAGNOSIS — J449 Chronic obstructive pulmonary disease, unspecified: Secondary | ICD-10-CM

## 2013-07-15 NOTE — Telephone Encounter (Signed)
Spoke with [name]. She is asking for pt to be worked in to see RB ONLY this afternoon. Pt saw PCP 07/13/13 was giving abx and prednisone. Pt is very SOB per [name]. Refuses to go to ED/UC. Per Meghan okay to work in on schedule this afternoon Called daughter and appt scheduled. Nothing further needed

## 2013-07-15 NOTE — Patient Instructions (Signed)
Please continue your prednisone and doxycycline as you are taking them Continue your zyrtec, singulair and astepro nasal spray Try using over the counter symptom relief like Thera-flu or Tylenol Cold & Flu.  You may also use over the counter allergy medications that contain chlorpheniramine Call our office if you are not improving in the next week Follow with Dr Lamonte Sakai in 3 months or sooner if you have any problems.

## 2013-07-15 NOTE — Progress Notes (Signed)
Subjective:    Patient ID: JEMARI HOARD, male    DOB: Oct 18, 1930, 80 y.o.   MRN: [id number] HPI 80 yo man, former smoker, CAD s/p MI 1/12, A Fib, allergies, exertional SOB for a few yrs. This has been worse since recent hosp in 1/12 - was cathed x 2, no intervention made, course c/b renal failure. 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 No hypoxemia reported with exercise. Was sent from the hospital on metoprolol (new). He has heard wheezing, especially when nasal allergies. Arlyce Harman at Dr Alla German office suggested AFL/COPD.   ROV 09/22/10 -- f/u SOB due to presumed COPD, CAD/MI, A Fib. Last time we started prn SABA to see if he would benefit - may have helped him some. He had full PFT's (09/18/10) . Dr Tamala Julian changed his metoprolol to verapamil but no change in breathing; he did get LE edema. He continues to have wheeze, cough, DOE. Also currently having severe PND and feels that it is contributing to his trouble. Taking allegra qd.  ROV 11/15/10 -- follow up for multifactorial dyspnea, CAD, probable COPD, allergic rhinitis. Returns today after we started Symbicort at last visit. He didn't fel that the SymbicorContinues to do Rehab at [facility]. The therapists were concerned that he was having exertional dyspnea, having to stop during workouts. Over last few days more allergy sx, on fexafenadine.   ROV 12/15/10 -- probable COPD, CAD, allergic rhinitis. He didn't improve w Symbicort, so we started Spiriva last visit. He believes that his breathing is a bit better. He has done better at Rehab on eliptical and treadmill. No hypoxemia. He is taking both allegra and zyrtec for allergies, as well as nasonex - still has severe PND and associated cough.   ROB 01/29/11 -- probable COPD, CAD, allergic rhinitis. Last time we discussed change from nasal steroid to astelin + NSW + zyrtec. He did not make the change. He isn't having the same degree of cough, but still with nasal gtt. Remains on Spiriva and  doing well.   ROV 05/14/11 -- probable COPD, CAD, allergic rhinitis. Returns for f/u. Last time changed nasal steroid to astepro. Still has significant nasal gtt, wheeze in the am, clearing mucous from throat. Arlyce Harman 4/12 was not impressive for significant AFL.   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 Was recently hospitalized with acute on chronic anemia while on coumadin. Also some degree of diastolic CHF and pulmonary edema. Feels much stronger, breathing is better. Occult blood negative. Fe started during the hosp.   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 Also hx chronic anemia on Fe. Returns f/u. He reports that his last Hgb w Dr Tamala Julian was 50. He is able to exert, but has some DOE after a block. Remains on Spiriva, uses SABA prn a couple times a day.  Lots of mucous and sinus drainage - taking zyrtec, NSW, astepro. Doing NSW before bed and then getting drainage to throat.   ROV 11/07/12 -- COPD, CAD, allergic rhinitis, chronic anemia. He reports that he has been having more problems at night - feels a drainage, a globus sensation, wheezing, coughing. No real snoring. He tries to clear his throat but there is nothing there. He stopped doing nasal saline washes months ago, still on zyrtec, astepro. Dr [name] added singulair recently.   ROV 02/06/13 -- COPD, CAD, allergic rhinitis, chronic anemia. He is still having trouble with cough especially at night. Has been doing  nasal saline at night. He had a 6 minute walk 8/28, walked shorter distance than he did previously, feels more SOB, has neuropathic pain. He is on spiriva, he is using albuterol 2x a day. Nasal congestion - on astepro qhs  ROV 03/05/13 -- COPD, CAD, allergic rhinitis, chronic anemia. Last time he was having more dyspnea, more cough. We tried adding Breo to Spiriva to see if he would benefit > his dyspnea and wheezing are both better. We discussed increasing  astepro but he hasn't needed to. He changed NSW to qam from bedtime.   ROV 07/15/13 -- COPD, CAD, allergic rhinitis, chronic anemia on Breo and Spiriva. He was seen 2/2 by PCP with URI sx > lots of nasal gtt, cough non-productive, some low grade fever. He was started on doxy + prednisone. The prednisone has helped him some. He is still having drainage. Remains on singulair, zyrtec, astepro nasal spray. Not using [place] regularly.   Review of Systems As per HPI    Objective:  Physical Exam Filed Vitals:   07/15/13 1550  BP: 130/88  Pulse: 105  Height: 5\' 3"  (1.6 m)  Weight: 169 lb 12.8 oz (77.021 kg)  SpO2: 93%   Gen: Pleasant, well-nourished, in no distress,  normal affect  ENT: No lesions,  mouth clear,  oropharynx clear, no postnasal drip  Neck: No JVD, insp and exp stridor  Lungs: No use of accessory muscles, referred UA noises.   Cardiovascular: RRR, heart sounds normal, no murmur or gallops, no peripheral edema  Musculoskeletal: No deformities, no cyanosis or clubbing  Neuro: alert, non focal  Skin: Warm, no lesions or rashes  Assessment & Plan:  COPD Acute flare of COPD and his UA instability in setting of apparent URI. I agree with his pred and doxy. This will have to run its course. Recommended symptomatic relief in addition to the above. He will call if not improving. If persists then will see him back and check CXR.

## 2013-07-15 NOTE — Assessment & Plan Note (Signed)
Acute flare of COPD and his UA instability in setting of apparent URI. I agree with his pred and doxy. This will have to run its course. Recommended symptomatic relief in addition to the above. He will call if not improving. If persists then will see him back and check CXR.

## 2013-08-03 ENCOUNTER — Ambulatory Visit (INDEPENDENT_AMBULATORY_CARE_PROVIDER_SITE_OTHER): Payer: Medicare Other | Admitting: Internal Medicine

## 2013-08-03 ENCOUNTER — Encounter: Payer: Self-pay | Admitting: Internal Medicine

## 2013-08-03 ENCOUNTER — Ambulatory Visit (INDEPENDENT_AMBULATORY_CARE_PROVIDER_SITE_OTHER)
Admission: RE | Admit: 2013-08-03 | Discharge: 2013-08-03 | Disposition: A | Discharge status: Home / Self Care | Payer: Medicare Other | Source: Ambulatory Visit | Attending: Internal Medicine | Admitting: Internal Medicine

## 2013-08-03 VITALS — BP 130/70 | HR 86 | Temp 97.8°F | Wt 162.0 lb

## 2013-08-03 DIAGNOSIS — M25532 Pain in left wrist: Secondary | ICD-10-CM

## 2013-08-03 DIAGNOSIS — M25539 Pain in unspecified wrist: Secondary | ICD-10-CM

## 2013-08-03 NOTE — Patient Instructions (Signed)
Please continue the acetaminophen. Try ice intermittently throughout the day. If you are not improving by the end of the week, please call for an appt with the sports medicine specialist--Dr Copland

## 2013-08-03 NOTE — Progress Notes (Signed)
Pre visit review using our clinic review tool, if applicable. No additional management support is needed unless otherwise documented below in the visit note. 

## 2013-08-03 NOTE — Progress Notes (Signed)
Subjective:    Patient ID: Eduardo [name], male    DOB: 1930-09-28, 80 y.o.   MRN: [id number]  HPI Here with wife  Having trouble with his left wrist Has been using pain med, voltaren gel and ice Started yesterday AM---awoke with the pain  Hadn't done anything new No known injury Has been wearing a brace that he has  ?slight swelling  Current Outpatient Prescriptions on File Prior to Visit  Medication Sig Dispense Refill  . acetaminophen (TYLENOL) 650 MG CR tablet Take 650 mg by mouth 3 (three) times daily.      Marland Kitchen albuterol (PROVENTIL HFA;VENTOLIN HFA) 108 (90 BASE) MCG/ACT inhaler Inhale 2 puffs into the lungs every 6 (six) hours as needed for wheezing or shortness of breath.      Marland Kitchen aspirin 81 MG tablet Take 81 mg by mouth daily.        Marland Kitchen atorvastatin (LIPITOR) 10 MG tablet Take 1 tablet (10 mg total) by mouth daily.  30 tablet  1  . Azelastine HCl (ASTEPRO) 0.15 % SOLN USE 2 SPRAYS IN EACH NOSTRIL TWICE A DAY  90 mL  1  . B Complex-C (SUPER B COMPLEX PO) Take 1 tablet by mouth daily.      Marland Kitchen buPROPion (WELLBUTRIN XL) 300 MG 24 hr tablet Take 1 tablet by mouth daily       . cetirizine (ZYRTEC) 10 MG tablet Take 10 mg by mouth daily.      . Coenzyme Q10 (CO Q-10) 50 MG CAPS Take 50 mg by mouth daily.      Marland Kitchen escitalopram (LEXAPRO) 20 MG tablet Take 40 mg by mouth daily.       . fenofibrate 160 MG tablet Take 160 mg by mouth daily.      . Fluticasone Furoate-Vilanterol (BREO ELLIPTA) 100-25 MCG/INH AEPB Inhale 1 puff into the lungs daily.  3 each  3  . montelukast (SINGULAIR) 10 MG tablet Take 1 tablet (10 mg total) by mouth at bedtime.  30 tablet  11  . Multiple Vitamin (MULTIVITAMIN) tablet Take 1 tablet by mouth daily.      Marland Kitchen pyridoxine (B-6) 100 MG tablet Take 100 mg by mouth daily.      Marland Kitchen tiotropium (SPIRIVA HANDIHALER) 18 MCG inhalation capsule INHALE THE CONTENTS OF 1 CAPSULE VIA HANDIHALER DAILY  90 capsule  3  . verapamil (VERELAN PM) 240 MG 24 hr capsule Take 1 tablet  by mouth daily       . vitamin B-12 (CYANOCOBALAMIN) 1000 MCG tablet Take 1,000 mcg by mouth daily.       No current facility-administered medications on file prior to visit.    Allergies  Allergen Reactions  . Penicillins Itching  . Sulfonamide Derivatives Other (See Comments)    "it affected my kidneys"    Past Medical History  Diagnosis Date  . Anemia   . Anxiety   . Atrial fibrillation   . CAD (coronary artery disease)   . Hyperlipidemia   . COPD (chronic obstructive pulmonary disease)   . Allergy   . Osteoarthritis   . Hx of colonic polyps   . Hyperplastic polyp of intestine 2001  . Adenomatous polyp 2001  . Hypertension 12/25/11    pt denies this history  . Myocardial infarction 06/2010    "twice; 1 day apart; during/after cath"  . Exertional dyspnea   . S/P appy     Past Surgical History  Procedure Laterality Date  . Appendectomy    .  Carpal tunnel release      left  . Mastoidectomy      x3 on right  . Pilonidal cyst / sinus excision    . Knee arthroscopy      left, right knee 10/2009  . Shoulder arthroscopy      right  . Foot fracture surgery      right heel repair  . Nasal septum surgery      nasal septal repair  . Penile prosthesis implant      x 2--got infection after 2nd and had to remove  . Cataract extraction w/ intraocular lens implant  2/13    Dr [name]  . Carpal tunnel release  10/11/11    Dr Rush Barer  . Tonsillectomy and adenoidectomy    . Fracture surgery    . Cardiac catheterization  2012    50% LAD and luminal irreg in RCA, 1/12  Post cath MI from damage  . Penile prosthesis  removal      Family History  Problem Relation Age of Onset  . Diabetes Neg Hx   . Hypertension Neg Hx   . Heart attack Father   . Cancer Mother     ? leukemia    History   Social History  . Marital Status: Married    Spouse Name: N/A    Number of Children: 4  . Years of Education: N/A   Occupational History  . Retired Tour manager the job trainin)    Social History Main Topics  . Smoking status: Former Smoker -- 2.50 packs/day for 30 years    Types: Cigarettes    Quit date: 06/11/1978  . Smokeless tobacco: Never Used  . Alcohol Use: Yes     Comment: [id number] alcoholic  . Drug Use: No  . Sexual Activity: Not Currently   Other Topics Concern  . Not on file   Social History Narrative   Grew up in [place]   Married---2nd   2 children ([place], [place]  and near [place])--2 stepchildren   Former Smoker--quit in 1980   Alcohol use-no. Quit in 1980--alcoholic      Has living will.   Has designated wife, then step daughter [name] have health care POA.   Would accept resuscitation attempts.   No feeding tube if cognitively unaware.            Review of Systems No fever No GI problems Is better from respiratory illness    Objective:   Physical Exam  Constitutional: He appears well-developed and well-nourished. No distress.  Musculoskeletal:  No sig swelling in left hand/wrist Marked tenderness over distal ulna Pain with passive supination>pronation Decreased grip strength          Assessment & Plan:

## 2013-08-03 NOTE — Assessment & Plan Note (Addendum)
Seems like soft tissue but striking localized tenderness X-ray is reassuring Not sure the brace is a good idea---probably some degree of tendonitis Discussed ice

## 2013-08-27 ENCOUNTER — Other Ambulatory Visit: Payer: Self-pay

## 2013-08-27 ENCOUNTER — Other Ambulatory Visit: Payer: Self-pay | Admitting: Interventional Cardiology

## 2013-08-27 MED ORDER — VERAPAMIL HCL ER 240 MG PO CP24: 240.0000 mg | ORAL_CAPSULE | Freq: Every day | ORAL | Status: DC

## 2013-09-16 ENCOUNTER — Other Ambulatory Visit: Payer: Self-pay

## 2013-09-16 ENCOUNTER — Telehealth: Payer: Self-pay | Admitting: Emergency Medicine

## 2013-09-16 MED ORDER — MONTELUKAST SODIUM 10 MG PO TABS: 10.0000 mg | ORAL_TABLET | Freq: Every day | ORAL | Status: DC

## 2013-09-16 MED ORDER — ATORVASTATIN CALCIUM 10 MG PO TABS: ORAL_TABLET | ORAL | Status: DC

## 2013-09-16 MED ORDER — ALBUTEROL SULFATE HFA 108 (90 BASE) MCG/ACT IN AERS: 2.0000 | INHALATION_SPRAY | Freq: Four times a day (QID) | RESPIRATORY_TRACT | Status: DC | PRN

## 2013-09-16 NOTE — Telephone Encounter (Signed)
Spoke with pt. Aware RX has been sent. Nothing further needed 

## 2013-09-16 NOTE — Telephone Encounter (Signed)
Pt request refill montelukast to [facility]. Advised done. Pt will schedule f/u appt with Dr [name] before refills are gone.

## 2013-10-21 ENCOUNTER — Encounter: Payer: Self-pay | Admitting: Internal Medicine

## 2013-10-21 ENCOUNTER — Ambulatory Visit (INDEPENDENT_AMBULATORY_CARE_PROVIDER_SITE_OTHER): Payer: Medicare Other | Admitting: Internal Medicine

## 2013-10-21 VITALS — BP 130/70 | HR 81 | Temp 98.2°F | Wt 163.0 lb

## 2013-10-21 DIAGNOSIS — R296 Repeated falls: Secondary | ICD-10-CM

## 2013-10-21 DIAGNOSIS — M48062 Spinal stenosis, lumbar region with neurogenic claudication: Secondary | ICD-10-CM

## 2013-10-21 DIAGNOSIS — Z9181 History of falling: Secondary | ICD-10-CM

## 2013-10-21 DIAGNOSIS — F411 Generalized anxiety disorder: Secondary | ICD-10-CM

## 2013-10-21 NOTE — Assessment & Plan Note (Signed)
Ongoing problems Combination of balance issues related to numbness on feet and poor endurance Some weakness at right hip---but not striking Discussed PT vs just working out in his gym---I think he can do this in the gym

## 2013-10-21 NOTE — Assessment & Plan Note (Signed)
This is part of the problem He needs to work on leg strength and that should help his endurance

## 2013-10-21 NOTE — Progress Notes (Signed)
Pre visit review using our clinic review tool, if applicable. No additional management support is needed unless otherwise documented below in the visit note. 

## 2013-10-21 NOTE — Assessment & Plan Note (Signed)
Seems to be reasonably controlled Will cut back the escitalopram to hopefully decrease his fall risk

## 2013-10-21 NOTE — Progress Notes (Signed)
Subjective:    Patient ID: Eduardo [name], male    DOB: 07/07/1930, 80 y.o.   MRN: [id number]  HPI Here with wife Has had some recent falls--twice in recent week and several before that Just loses balance--even falls backwards if he is just combing his hair [place] coming up basement stairs Does have some foot numbness  Wife still feels he has weakness in legs Fatigues easy if he walks more Still uses the cane  Does do yoga once a week Occasionally does weight work at his gym Has spoken to a trainer  Anxiety controlled "to a point" Unhappy about his limitations  Current Outpatient Prescriptions on File Prior to Visit  Medication Sig Dispense Refill  . acetaminophen (TYLENOL) 650 MG CR tablet Take 650 mg by mouth 3 (three) times daily.      Marland Kitchen albuterol (PROAIR HFA) 108 (90 BASE) MCG/ACT inhaler Inhale 2 puffs into the lungs every 6 (six) hours as needed for wheezing or shortness of breath.  3 Inhaler  3  . aspirin 81 MG tablet Take 81 mg by mouth daily.        Marland Kitchen atorvastatin (LIPITOR) 10 MG tablet TAKE 1 TABLET BY MOUTH DAILY  90 tablet  3  . Azelastine HCl (ASTEPRO) 0.15 % SOLN USE 2 SPRAYS IN EACH NOSTRIL TWICE A DAY  90 mL  1  . buPROPion (WELLBUTRIN XL) 300 MG 24 hr tablet Take 1 tablet by mouth daily       . cetirizine (ZYRTEC) 10 MG tablet Take 10 mg by mouth daily.      Marland Kitchen escitalopram (LEXAPRO) 20 MG tablet Take 40 mg by mouth daily.       . fenofibrate 160 MG tablet Take 160 mg by mouth daily.      . Fluticasone Furoate-Vilanterol (BREO ELLIPTA) 100-25 MCG/INH AEPB Inhale 1 puff into the lungs daily.  3 each  3  . montelukast (SINGULAIR) 10 MG tablet Take 1 tablet (10 mg total) by mouth at bedtime.  90 tablet  1  . Multiple Vitamin (MULTIVITAMIN) tablet Take 1 tablet by mouth daily.      Marland Kitchen tiotropium (SPIRIVA HANDIHALER) 18 MCG inhalation capsule INHALE THE CONTENTS OF 1 CAPSULE VIA HANDIHALER DAILY  90 capsule  3  . verapamil (VERELAN PM) 240 MG 24 hr capsule Take 1  capsule (240 mg total) by mouth at bedtime.  90 capsule  4  . vitamin B-12 (CYANOCOBALAMIN) 1000 MCG tablet Take 1,000 mcg by mouth daily.       No current facility-administered medications on file prior to visit.    Allergies  Allergen Reactions  . Penicillins Itching  . Sulfonamide Derivatives Other (See Comments)    "it affected my kidneys"    Past Medical History  Diagnosis Date  . Anemia   . Anxiety   . Atrial fibrillation   . CAD (coronary artery disease)   . Hyperlipidemia   . COPD (chronic obstructive pulmonary disease)   . Allergy   . Osteoarthritis   . Hx of colonic polyps   . Hyperplastic polyp of intestine 2001  . Adenomatous polyp 2001  . Hypertension 12/25/11    pt denies this history  . Myocardial infarction 06/2010    "twice; 1 day apart; during/after cath"  . Exertional dyspnea   . S/P appy     Past Surgical History  Procedure Laterality Date  . Appendectomy    . Carpal tunnel release      left  .  Mastoidectomy      x3 on right  . Pilonidal cyst / sinus excision    . Knee arthroscopy      left, right knee 10/2009  . Shoulder arthroscopy      right  . Foot fracture surgery      right heel repair  . Nasal septum surgery      nasal septal repair  . Penile prosthesis implant      x 2--got infection after 2nd and had to remove  . Cataract extraction w/ intraocular lens implant  2/13    Dr [name]  . Carpal tunnel release  10/11/11    Dr Rush Barer  . Tonsillectomy and adenoidectomy    . Fracture surgery    . Cardiac catheterization  2012    50% LAD and luminal irreg in RCA, 1/12  Post cath MI from damage  . Penile prosthesis  removal      Family History  Problem Relation Age of Onset  . Diabetes Neg Hx   . Hypertension Neg Hx   . Heart attack Father   . Cancer Mother     ? leukemia    History   Social History  . Marital Status: Married    Spouse Name: N/A    Number of Children: 4  . Years of Education: N/A    Occupational History  . Retired Chief Strategy Officer the job trainin)    Social History Main Topics  . Smoking status: Former Smoker -- 2.50 packs/day for 30 years    Types: Cigarettes    Quit date: 06/11/1978  . Smokeless tobacco: Never Used  . Alcohol Use: Yes     Comment: [id number] alcoholic  . Drug Use: No  . Sexual Activity: Not Currently   Other Topics Concern  . Not on file   Social History Narrative   Grew up in [place]   Married---2nd   2 children ([place], [place]  and near [place])--2 stepchildren   Former Smoker--quit in 1980   Alcohol use-no. Quit in 1980--alcoholic      Has living will.   Has designated wife, then step daughter [name] have health care POA.   Would accept resuscitation attempts.   No feeding tube if cognitively unaware.            Review of Systems Sleeping well Appetite is okay     Objective:   Physical Exam  Constitutional: He appears well-developed and well-nourished. No distress.  Musculoskeletal:  No joint swelling  Neurological:  Gait is fine with cane Romberg negative Decreased strength in right hip flexors and extensors (very mild) Very little fine touch sensation on plantar feet  Psychiatric: He has a normal mood and affect. His behavior is normal.          Assessment & Plan:

## 2013-11-12 ENCOUNTER — Ambulatory Visit (INDEPENDENT_AMBULATORY_CARE_PROVIDER_SITE_OTHER): Payer: Medicare Other | Admitting: Internal Medicine

## 2013-11-12 ENCOUNTER — Encounter: Payer: Self-pay | Admitting: Internal Medicine

## 2013-11-12 VITALS — BP 140/70 | HR 80 | Temp 98.2°F | Wt 161.0 lb

## 2013-11-12 DIAGNOSIS — R030 Elevated blood-pressure reading, without diagnosis of hypertension: Secondary | ICD-10-CM

## 2013-11-12 NOTE — Assessment & Plan Note (Signed)
BP Readings from Last 3 Encounters:  11/12/13 140/70  10/21/13 130/70  08/03/13 130/70   Repeat 144/70 on right Reassured his BP has been fine Might need to calibrate the machine the therapist is using

## 2013-11-12 NOTE — Progress Notes (Signed)
Subjective:    Patient ID: Eduardo [name], male    DOB: 1930/07/31, 80 y.o.   MRN: [id number]  HPI Started therapy with Jacqlyn Larsen from [place] at a gym (former [employer]) Doing work on different things---ball for [employer], etc Seems to be making some progress She checks his BP daily and thinks it is high (she uses automated machine) 149-164/90 or below  No headache No chest pain No SOB  Current Outpatient Prescriptions on File Prior to Visit  Medication Sig Dispense Refill  . acetaminophen (TYLENOL) 650 MG CR tablet Take 650 mg by mouth 3 (three) times daily.      Marland Kitchen albuterol (PROAIR HFA) 108 (90 BASE) MCG/ACT inhaler Inhale 2 puffs into the lungs every 6 (six) hours as needed for wheezing or shortness of breath.  3 Inhaler  3  . aspirin 81 MG tablet Take 81 mg by mouth daily.        Marland Kitchen atorvastatin (LIPITOR) 10 MG tablet TAKE 1 TABLET BY MOUTH DAILY  90 tablet  3  . Azelastine HCl (ASTEPRO) 0.15 % SOLN USE 2 SPRAYS IN EACH NOSTRIL TWICE A DAY  90 mL  1  . buPROPion (WELLBUTRIN XL) 300 MG 24 hr tablet Take 1 tablet by mouth daily       . cetirizine (ZYRTEC) 10 MG tablet Take 10 mg by mouth daily.      Marland Kitchen escitalopram (LEXAPRO) 20 MG tablet Take 20 mg by mouth daily.       . fenofibrate 160 MG tablet Take 160 mg by mouth daily.      . Fluticasone Furoate-Vilanterol (BREO ELLIPTA) 100-25 MCG/INH AEPB Inhale 1 puff into the lungs daily.  3 each  3  . montelukast (SINGULAIR) 10 MG tablet Take 1 tablet (10 mg total) by mouth at bedtime.  90 tablet  1  . Multiple Vitamin (MULTIVITAMIN) tablet Take 1 tablet by mouth daily.      Marland Kitchen tiotropium (SPIRIVA HANDIHALER) 18 MCG inhalation capsule INHALE THE CONTENTS OF 1 CAPSULE VIA HANDIHALER DAILY  90 capsule  3  . verapamil (VERELAN PM) 240 MG 24 hr capsule Take 1 capsule (240 mg total) by mouth at bedtime.  90 capsule  4  . vitamin B-12 (CYANOCOBALAMIN) 1000 MCG tablet Take 1,000 mcg by mouth daily.       No current facility-administered  medications on file prior to visit.    Allergies  Allergen Reactions  . Penicillins Itching  . Sulfonamide Derivatives Other (See Comments)    "it affected my kidneys"    Past Medical History  Diagnosis Date  . Anemia   . Anxiety   . Atrial fibrillation   . CAD (coronary artery disease)   . Hyperlipidemia   . COPD (chronic obstructive pulmonary disease)   . Allergy   . Osteoarthritis   . Hx of colonic polyps   . Hyperplastic polyp of intestine 2001  . Adenomatous polyp 2001  . Hypertension 12/25/11    pt denies this history  . Myocardial infarction 06/2010    "twice; 1 day apart; during/after cath"  . Exertional dyspnea   . S/P appy     Past Surgical History  Procedure Laterality Date  . Appendectomy    . Carpal tunnel release      left  . Mastoidectomy      x3 on right  . Pilonidal cyst / sinus excision    . Knee arthroscopy      left, right knee 10/2009  . Shoulder  arthroscopy      right  . Foot fracture surgery      right heel repair  . Nasal septum surgery      nasal septal repair  . Penile prosthesis implant      x 2--got infection after 2nd and had to remove  . Cataract extraction w/ intraocular lens implant  2/13    Dr [name]  . Carpal tunnel release  10/11/11    Dr Rush Barer  . Tonsillectomy and adenoidectomy    . Fracture surgery    . Cardiac catheterization  2012    50% LAD and luminal irreg in RCA, 1/12  Post cath MI from damage  . Penile prosthesis  removal      Family History  Problem Relation Age of Onset  . Diabetes Neg Hx   . Hypertension Neg Hx   . Heart attack Father   . Cancer Mother     ? leukemia    History   Social History  . Marital Status: Married    Spouse Name: N/A    Number of Children: 4  . Years of Education: N/A   Occupational History  . Retired Chief Strategy Officer the job trainin)    Social History Main Topics  . Smoking status: Former Smoker -- 2.50 packs/day for 30 years    Types: Cigarettes     Quit date: 06/11/1978  . Smokeless tobacco: Never Used  . Alcohol Use: Yes     Comment: [id number] alcoholic  . Drug Use: No  . Sexual Activity: Not Currently   Other Topics Concern  . Not on file   Social History Narrative   Grew up in [place]   Married---2nd   2 children ([place], [place]  and near [place])--2 stepchildren   Former Smoker--quit in 1980   Alcohol use-no. Quit in 1980--alcoholic      Has living will.   Has designated wife, then step daughter [name] have health care POA.   Would accept resuscitation attempts.   No feeding tube if cognitively unaware.            Review of Systems Balance seems to be improving Sleeping well    Objective:   Physical Exam  Constitutional: He appears well-developed and well-nourished. No distress.  Neck: Normal range of motion. Neck supple. No thyromegaly present.  Cardiovascular: Normal rate, regular rhythm and normal heart sounds.  Exam reveals no gallop.   No murmur heard. Pulmonary/Chest: Effort normal and breath sounds normal. No respiratory distress. He has no wheezes. He has no rales.  Musculoskeletal: He exhibits no edema and no tenderness.  Lymphadenopathy:    He has no cervical adenopathy.  Psychiatric: He has a normal mood and affect. His behavior is normal.          Assessment & Plan:

## 2013-11-17 ENCOUNTER — Telehealth: Payer: Self-pay | Admitting: Internal Medicine

## 2013-11-17 NOTE — Telephone Encounter (Signed)
Tell her no exercise if his BP is over 180/110.  I don't think she should have to test his blood pressure before he exercises if I don't think he has a problem. She has caused him great anxiety (and he does okay with that without any help from someone else!)

## 2013-11-17 NOTE — Telephone Encounter (Signed)
Spoke with therapist and advised results

## 2013-11-17 NOTE — Telephone Encounter (Signed)
Dr. [name], [name] a Physical Therapist at her own private practice travels to [place] (pt's gym) to assist Eduardo [name] in good exercise and a more therapeutic fitness program. She is very concerned with Eduardo [name] blood pressure. Yesterday's reading was 168/93. Other readings she has gotten from pt is 165/91. You recently saw pt on 6/5 and mentioned to him that the BP machine needed to be calibrated. Eduardo [name] went and bought a new machine, tested it on herself and compared the two machines and they were accurate. She states that she is not comfortable doing exercises when his BP is that high. She states this is an un medicated problem. She would like a verbal order with clarifications to not perform any formal exercises if BP reading is over a certain number. Patients next appointment with Eduardo [name] is tomorrow 6/10 @ [facility] callback # [phone]. Please advise.

## 2013-12-10 ENCOUNTER — Other Ambulatory Visit: Payer: Self-pay | Admitting: *Deleted

## 2013-12-10 MED ORDER — FENOFIBRATE 160 MG PO TABS: 160.0000 mg | ORAL_TABLET | Freq: Every day | ORAL | Status: DC

## 2014-01-01 ENCOUNTER — Ambulatory Visit (INDEPENDENT_AMBULATORY_CARE_PROVIDER_SITE_OTHER): Payer: Medicare Other | Admitting: Emergency Medicine

## 2014-01-01 ENCOUNTER — Encounter: Payer: Self-pay | Admitting: Emergency Medicine

## 2014-01-01 VITALS — BP 128/88 | HR 76 | Ht 63.0 in | Wt 162.8 lb

## 2014-01-01 DIAGNOSIS — J309 Allergic rhinitis, unspecified: Secondary | ICD-10-CM

## 2014-01-01 DIAGNOSIS — J4489 Other specified chronic obstructive pulmonary disease: Secondary | ICD-10-CM

## 2014-01-01 DIAGNOSIS — J449 Chronic obstructive pulmonary disease, unspecified: Secondary | ICD-10-CM

## 2014-01-01 NOTE — Patient Instructions (Signed)
We will continue your Breo and Spiriva daily You may need to restart your nasal saline washes this Fall for allergy season.  Continue your other medications as you have been taking them.  Follow with Dr Lamonte Sakai in 6 months or sooner if you have any problems

## 2014-01-01 NOTE — Progress Notes (Signed)
Subjective:    Patient ID: Eduardo [name], male    DOB: [birth year], 80 y.o.   MRN: [id number] HPI 80 yo man, former smoker, CAD s/p MI 1/12, A Fib, allergies, exertional SOB for a few yrs. This has been worse since recent hosp in 1/12 - was cathed x 2, no intervention made, course c/b renal failure. 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 No hypoxemia reported with exercise. Was sent from the hospital on metoprolol (new). He has heard wheezing, especially when nasal allergies. Arlyce Harman at Dr Alla German office suggested AFL/COPD.   ROV 09/22/10 -- f/u SOB due to presumed COPD, CAD/MI, A Fib. Last time we started prn SABA to see if he would benefit - may have helped him some. He had full PFT's (09/18/10) . Dr Tamala Julian changed his metoprolol to verapamil but no change in breathing; he did get LE edema. He continues to have wheeze, cough, DOE. Also currently having severe PND and feels that it is contributing to his trouble. Taking allegra qd.  ROV 11/15/10 -- follow up for multifactorial dyspnea, CAD, probable COPD, allergic rhinitis. Returns today after we started Symbicort at last visit. He didn't fel that the SymbicorContinues to do Rehab at [facility]. The therapists were concerned that he was having exertional dyspnea, having to stop during workouts. Over last few days more allergy sx, on fexafenadine.   ROV 12/15/10 -- probable COPD, CAD, allergic rhinitis. He didn't improve w Symbicort, so we started Spiriva last visit. He believes that his breathing is a bit better. He has done better at Rehab on eliptical and treadmill. No hypoxemia. He is taking both allegra and zyrtec for allergies, as well as nasonex - still has severe PND and associated cough.   ROB 01/29/11 -- probable COPD, CAD, allergic rhinitis. Last time we discussed change from nasal steroid to astelin + NSW + zyrtec. He did not make the change. He isn't having the same degree of cough, but still with nasal gtt. Remains on Spiriva and  doing well.   ROV 05/14/11 -- probable COPD, CAD, allergic rhinitis. Returns for f/u. Last time changed nasal steroid to astepro. Still has significant nasal gtt, wheeze in the am, clearing mucous from throat. Arlyce Harman 4/12 was not impressive for significant AFL.   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 Was recently hospitalized with acute on chronic anemia while on coumadin. Also some degree of diastolic CHF and pulmonary edema. Feels much stronger, breathing is better. Occult blood negative. Fe started during the hosp.   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 Also hx chronic anemia on Fe. Returns f/u. He reports that his last Hgb w Dr Tamala Julian was 5. He is able to exert, but has some DOE after a block. Remains on Spiriva, uses SABA prn a couple times a day.  Lots of mucous and sinus drainage - taking zyrtec, NSW, astepro. Doing NSW before bed and then getting drainage to throat.   ROV 11/07/12 -- COPD, CAD, allergic rhinitis, chronic anemia. He reports that he has been having more problems at night - feels a drainage, a globus sensation, wheezing, coughing. No real snoring. He tries to clear his throat but there is nothing there. He stopped doing nasal saline washes months ago, still on zyrtec, astepro. Dr [name] added singulair recently.   ROV 02/06/13 -- COPD, CAD, allergic rhinitis, chronic anemia. He is still having trouble with cough especially at night. Has been doing  nasal saline at night. He had a 6 minute walk 8/28, walked shorter distance than he did previously, feels more SOB, has neuropathic pain. He is on spiriva, he is using albuterol 2x a day. Nasal congestion - on astepro qhs  ROV 03/05/13 -- COPD, CAD, allergic rhinitis, chronic anemia. Last time he was having more dyspnea, more cough. We tried adding Breo to Spiriva to see if he would benefit > his dyspnea and wheezing are both better. We discussed increasing  astepro but he hasn't needed to. He changed NSW to qam from bedtime.   ROV 07/15/13 -- COPD, CAD, allergic rhinitis, chronic anemia on Breo and Spiriva. He was seen 2/2 by PCP with URI sx > lots of nasal gtt, cough non-productive, some low grade fever. He was started on doxy + prednisone. The prednisone has helped him some. He is still having drainage. Remains on singulair, zyrtec, astepro nasal spray. Not using [place] regularly.   ROV 01/01/14 -- COPD, CAD, allergic rhinitis, chronic anemia on Breo and Spiriva. He has been doing very well, especially since change to [facility]. He has been active, breathing is improved. He is doing well on his usual allergy regimen.   Review of Systems As per HPI    Objective:  Physical Exam Filed Vitals:   01/01/14 1020  BP: 128/88  Pulse: 76  Height: 5\' 3"  (1.6 m)  Weight: 162 lb 12.8 oz (73.846 kg)  SpO2: 94%   Gen: Pleasant, well-nourished, in no distress,  normal affect  ENT: No lesions,  mouth clear,  oropharynx clear, no postnasal drip  Neck: No JVD, insp and exp stridor  Lungs: No use of accessory muscles, referred UA noises.   Cardiovascular: RRR, heart sounds normal, no murmur or gallops, no peripheral edema  Musculoskeletal: No deformities, no cyanosis or clubbing  Neuro: alert, non focal  Skin: Warm, no lesions or rashes  Assessment & Plan:  COPD He has been doing very well. No evidence of exacerbations and he felt a clinical improvement when Breo was added to his regimen. We discussed possibly simplifying today by  stopping Spiriva. At this time he would like to leave the medications as they are because he is doing so well. He can revisit this next time. I reminded him to get the flu shot this fall. Follow in 6 months  ALLERGIC RHINITIS Continue same allergy regimen. Reminded him to add nasal saline washes daily this fall for allergy season.

## 2014-01-01 NOTE — Assessment & Plan Note (Signed)
He has been doing very well. No evidence of exacerbations and he felt a clinical improvement when Breo was added to his regimen. We discussed possibly simplifying today by  stopping Spiriva. At this time he would like to leave the medications as they are because he is doing so well. He can revisit this next time. I reminded him to get the flu shot this fall. Follow in 6 months

## 2014-01-01 NOTE — Assessment & Plan Note (Signed)
Continue same allergy regimen. Reminded him to add nasal saline washes daily this fall for allergy season.

## 2014-02-16 ENCOUNTER — Telehealth: Payer: Self-pay

## 2014-02-16 NOTE — Telephone Encounter (Signed)
Pt left v/m requesting authorization from Dr [name] to use a tens unit for pts back pain. Pt request cb.

## 2014-02-17 ENCOUNTER — Ambulatory Visit (INDEPENDENT_AMBULATORY_CARE_PROVIDER_SITE_OTHER): Payer: Medicare Other

## 2014-02-17 DIAGNOSIS — Z23 Encounter for immunization: Secondary | ICD-10-CM

## 2014-02-17 NOTE — Telephone Encounter (Signed)
It is okay for them to try the TENS unit

## 2014-02-17 NOTE — Telephone Encounter (Signed)
Spoke with patient and advised results   

## 2014-02-25 ENCOUNTER — Other Ambulatory Visit: Payer: Self-pay | Admitting: *Deleted

## 2014-02-25 MED ORDER — FENOFIBRATE 160 MG PO TABS: 160.0000 mg | ORAL_TABLET | Freq: Every day | ORAL | Status: DC

## 2014-03-09 ENCOUNTER — Telehealth: Payer: Self-pay | Admitting: Internal Medicine

## 2014-03-09 NOTE — Telephone Encounter (Signed)
I left a message on patient's voice mail that appointment was rescheduled to 03/2014  at 11:15.  I asked patient to call me back to confirm appointment.

## 2014-03-09 NOTE — Telephone Encounter (Signed)
Pt had to cancel his mcr wellness to tomorrow 0000000 due to a conflict with anther another Dr appt. Your next available cpe is not until March, will you be able to accommodate him before March?   [phone]

## 2014-03-09 NOTE — Telephone Encounter (Signed)
Please find him an earlier time

## 2014-03-17 ENCOUNTER — Encounter: Payer: Medicare Other | Admitting: Internal Medicine

## 2014-03-26 ENCOUNTER — Other Ambulatory Visit: Payer: Self-pay | Admitting: Internal Medicine

## 2014-03-30 ENCOUNTER — Encounter: Payer: Medicare Other | Admitting: Internal Medicine

## 2014-04-05 ENCOUNTER — Encounter: Payer: Self-pay | Admitting: Internal Medicine

## 2014-04-05 ENCOUNTER — Ambulatory Visit (INDEPENDENT_AMBULATORY_CARE_PROVIDER_SITE_OTHER): Payer: Medicare Other | Admitting: Internal Medicine

## 2014-04-05 VITALS — BP 150/70 | HR 95 | Temp 98.0°F | Wt 160.0 lb

## 2014-04-05 DIAGNOSIS — J069 Acute upper respiratory infection, unspecified: Secondary | ICD-10-CM

## 2014-04-05 NOTE — Assessment & Plan Note (Signed)
Lots of clear drainage Could be allergy but doubt anything in [place] that isn't here (mold?) Will try loratadine for symptom relief Sleep propped up Would consider empiric antibiotic if worsening later this week

## 2014-04-05 NOTE — Patient Instructions (Signed)
Please try loratadine 10mg  once or twice a day to reduce the drainage. Call if you are getting worse later this week.

## 2014-04-05 NOTE — Progress Notes (Signed)
Subjective:    Patient ID: Eduardo [name], male    DOB: May 05, 1931, 80 y.o.   MRN: [id number]  HPI Having "terrible" nasal dripping by afternoon Sinuses are clogges  Drainage to throat--very bad when lying down Throat is itchy--cough drops help in day but keeping him up at night  Started 3 days ago Recent trip to [employer]  Cough is persistent--mostly dry Regular SOB--- no worse then usual No fever No night sweats or chills  Hasn't tried anything for the drainage Wife did give him something--- may have helped a little  Current Outpatient Prescriptions on File Prior to Visit  Medication Sig Dispense Refill  . albuterol (PROAIR HFA) 108 (90 BASE) MCG/ACT inhaler Inhale 2 puffs into the lungs every 6 (six) hours as needed for wheezing or shortness of breath.  3 Inhaler  3  . aspirin 81 MG tablet Take 81 mg by mouth daily.        Marland Kitchen atorvastatin (LIPITOR) 10 MG tablet TAKE 1 TABLET BY MOUTH DAILY  90 tablet  3  . Azelastine HCl (ASTEPRO) 0.15 % SOLN USE 2 SPRAYS IN EACH NOSTRIL TWICE A DAY  90 mL  1  . cetirizine (ZYRTEC) 10 MG tablet Take 10 mg by mouth daily.      Marland Kitchen escitalopram (LEXAPRO) 20 MG tablet Take 20 mg by mouth daily.       . fenofibrate 160 MG tablet Take 1 tablet (160 mg total) by mouth daily.  90 tablet  0  . Fluticasone Furoate-Vilanterol (BREO ELLIPTA) 100-25 MCG/INH AEPB Inhale 1 puff into the lungs daily.  3 each  3  . montelukast (SINGULAIR) 10 MG tablet TAKE ONE TABLET BY MOUTH EVERY NIGHT AT BEDTIME  30 tablet  5  . Multiple Vitamin (MULTIVITAMIN) tablet Take 1 tablet by mouth daily.      . verapamil (VERELAN PM) 240 MG 24 hr capsule Take 1 capsule (240 mg total) by mouth at bedtime.  90 capsule  4   No current facility-administered medications on file prior to visit.    Allergies  Allergen Reactions  . Penicillins Itching  . Sulfonamide Derivatives Other (See Comments)    "it affected my kidneys"    Past Medical History  Diagnosis  Date  . Anemia   . Anxiety   . Atrial fibrillation   . CAD (coronary artery disease)   . Hyperlipidemia   . COPD (chronic obstructive pulmonary disease)   . Allergy   . Osteoarthritis   . Hx of colonic polyps   . Hyperplastic polyp of intestine 2001  . Adenomatous polyp 2001  . Hypertension 12/25/11    pt denies this history  . Myocardial infarction 06/2010    "twice; 1 day apart; during/after cath"  . Exertional dyspnea   . S/P appy   . Arrhythmia     Past Surgical History  Procedure Laterality Date  . Appendectomy    . Carpal tunnel release      left  . Mastoidectomy      x3 on right  . Pilonidal cyst / sinus excision    . Knee arthroscopy      left, right knee 10/2009  . Shoulder arthroscopy      right  . Foot fracture surgery      right heel repair  . Nasal septum surgery      nasal septal repair  . Penile prosthesis implant      x 2--got infection after 2nd and had to remove  .  Cataract extraction w/ intraocular lens implant  2/13    Dr [name]  . Carpal tunnel release  10/11/11    Dr Rush Barer  . Tonsillectomy and adenoidectomy    . Fracture surgery    . Cardiac catheterization  2012    50% LAD and luminal irreg in RCA, 1/12  Post cath MI from damage  . Penile prosthesis  removal      Family History  Problem Relation Age of Onset  . Diabetes Neg Hx   . Hypertension Neg Hx   . Heart attack Father   . Cancer Mother     ? leukemia    History   Social History  . Marital Status: Married    Spouse Name: N/A    Number of Children: 4  . Years of Education: N/A   Occupational History  . Retired Chief Strategy Officer the job trainin)    Social History Main Topics  . Smoking status: Former Smoker -- 2.50 packs/day for 30 years    Types: Cigarettes    Quit date: 06/11/1978  . Smokeless tobacco: Never Used  . Alcohol Use: Yes     Comment: QUIT [id number] alcoholic  . Drug Use: No  . Sexual Activity: Not Currently   Other Topics Concern  .  Not on file   Social History Narrative   Grew up in [place]   Married---2nd   2 children ([place], [place]  and near [place])--2 stepchildren   Former Smoker--quit in 1980   Alcohol use-no. Quit in 1980--alcoholic      Has living will.   Has designated wife, then step daughter [name] have health care POA.   Would accept resuscitation attempts.   No feeding tube if cognitively unaware.            Review of Systems Flu shot over a month ago No rash No vomiting or diarrhea Some rectal urgency with little success at times    Objective:   Physical Exam  Constitutional: He appears well-developed and well-nourished. No distress.  HENT:  Mouth/Throat: Oropharynx is clear and moist. No oropharyngeal exudate.  No sinus tenderness TMs normal Mild nasal congestion  Neck: Normal range of motion. Neck supple.  Pulmonary/Chest: Effort normal and breath sounds normal. No respiratory distress. He has no wheezes. He has no rales.  Lymphadenopathy:    He has no cervical adenopathy.          Assessment & Plan:

## 2014-04-15 ENCOUNTER — Ambulatory Visit (INDEPENDENT_AMBULATORY_CARE_PROVIDER_SITE_OTHER): Payer: Medicare Other | Admitting: Interventional Cardiology

## 2014-04-15 ENCOUNTER — Encounter: Payer: Self-pay | Admitting: Interventional Cardiology

## 2014-04-15 VITALS — BP 149/79 | HR 80 | Ht 67.0 in | Wt 159.0 lb

## 2014-04-15 DIAGNOSIS — I10 Essential (primary) hypertension: Secondary | ICD-10-CM

## 2014-04-15 DIAGNOSIS — E782 Mixed hyperlipidemia: Secondary | ICD-10-CM

## 2014-04-15 DIAGNOSIS — I25119 Atherosclerotic heart disease of native coronary artery with unspecified angina pectoris: Secondary | ICD-10-CM

## 2014-04-15 DIAGNOSIS — I4819 Other persistent atrial fibrillation: Secondary | ICD-10-CM

## 2014-04-15 DIAGNOSIS — I481 Persistent atrial fibrillation: Secondary | ICD-10-CM

## 2014-04-15 DIAGNOSIS — I2510 Atherosclerotic heart disease of native coronary artery without angina pectoris: Secondary | ICD-10-CM

## 2014-04-15 DIAGNOSIS — I5032 Chronic diastolic (congestive) heart failure: Secondary | ICD-10-CM

## 2014-04-15 NOTE — Progress Notes (Signed)
Patient ID: Eduardo [name], male   DOB: 1930-08-22, 80 y.o.   MRN: [id number]    [address], [place]  [address] Phone: [phone] Fax:  [phone]  Date:  04/15/2014   ID:  Eduardo [name], DOB 06/24/1930, MRN [id number]  PCP:  [name], MD   ASSESSMENT:  1. Chronic atrial fibrillation with good rate control. No anticoagulation therapy because of previous significant bleeding 2. Chronic combined systolic and diastolic heart failure 3. Coronary artery disease, asymptomatic 4. Hypertension, controlled  PLAN:  1. No change in current medical regimen 2. Clinical follow-up with me in one year 3. Call if angina, dyspnea, or edema.   SUBJECTIVE: Eduardo [name] is a 80 y.o. male who is limited by lower extremity weakness and pain. He has as back discomfort. He has not had syncope. He denies nitroglycerin use. No orthopnea or PND.   Wt Readings from Last 3 Encounters:  04/15/14 159 lb (72.122 kg)  04/05/14 160 lb (72.576 kg)  01/01/14 162 lb 12.8 oz (73.846 kg)     Past Medical History  Diagnosis Date  . Anemia   . Anxiety   . Atrial fibrillation   . CAD (coronary artery disease)   . Hyperlipidemia   . COPD (chronic obstructive pulmonary disease)   . Allergy   . Osteoarthritis   . Hx of colonic polyps   . Hyperplastic polyp of intestine 2001  . Adenomatous polyp 2001  . Hypertension 12/25/11    pt denies this history  . Myocardial infarction 06/2010    "twice; 1 day apart; during/after cath"  . Exertional dyspnea   . S/P appy   . Arrhythmia     Current Outpatient Prescriptions  Medication Sig Dispense Refill  . albuterol (PROAIR HFA) 108 (90 BASE) MCG/ACT inhaler Inhale 2 puffs into the lungs every 6 (six) hours as needed for wheezing or shortness of breath. 3 Inhaler 3  . ARIPiprazole (ABILIFY) 5 MG tablet Take 5 mg by mouth daily.    Marland Kitchen aspirin 81 MG tablet Take 81 mg by mouth daily.      Marland Kitchen atorvastatin (LIPITOR) 10 MG  tablet TAKE 1 TABLET BY MOUTH DAILY 90 tablet 3  . Azelastine HCl (ASTEPRO) 0.15 % SOLN USE 2 SPRAYS IN EACH NOSTRIL TWICE A DAY 90 mL 1  . cetirizine (ZYRTEC) 10 MG tablet Take 10 mg by mouth daily.    Marland Kitchen escitalopram (LEXAPRO) 20 MG tablet Take 20 mg by mouth daily.     . fenofibrate 160 MG tablet Take 1 tablet (160 mg total) by mouth daily. 90 tablet 0  . Fluticasone Furoate-Vilanterol (BREO ELLIPTA) 100-25 MCG/INH AEPB Inhale 1 puff into the lungs daily. 3 each 3  . montelukast (SINGULAIR) 10 MG tablet TAKE ONE TABLET BY MOUTH EVERY NIGHT AT BEDTIME 30 tablet 5  . Multiple Vitamin (MULTIVITAMIN) tablet Take 1 tablet by mouth daily.    . verapamil (VERELAN PM) 240 MG 24 hr capsule Take 1 capsule (240 mg total) by mouth at bedtime. 90 capsule 4   No current facility-administered medications for this visit.    Allergies:    Allergies  Allergen Reactions  . Penicillins Itching  . Sulfonamide Derivatives Other (See Comments)    "it affected my kidneys"    Social History:  The patient  reports that he quit smoking about 35 years ago. His smoking use included Cigarettes. He has a 75 pack-year smoking history. He has never used smokeless tobacco.  He reports that he drinks alcohol. He reports that he does not use illicit drugs.   ROS:  Please see the history of present illness.   He decided against chronic anticoagulation therapy because of a significant bleed remotely. He has not had transient neurological complaints. Appetite is stable.   All other systems reviewed and negative.   OBJECTIVE: VS:  BP 149/79 mmHg  Pulse 80  Ht 5\' 7"  (1.702 m)  Wt 159 lb (72.122 kg)  BMI 24.90 kg/m2 Well nourished, well developed, in no acute distress, elderly HEENT: normal Neck: JVD flat. Carotid bruit absent  Cardiac:  normal S1, S2; IIRR; no murmur Lungs:  clear to auscultation bilaterally, no wheezing, rhonchi or rales Abd: soft, nontender, no hepatomegaly Ext: Edema trace bilateral lower extremity  edema. Pulses trace bilateral Skin: warm and dry Neuro:  CNs 2-12 intact, no focal abnormalities noted  EKG:  Atrial fibrillation with controlled rate at 80 bpm Paul wave progression V1 through V3 inferolateral T-wave abnormality interventricular conduction delay       Signed, Illene Labrador III, MD 04/15/2014 8:44 AM

## 2014-04-15 NOTE — Patient Instructions (Signed)
Your physician recommends that you continue on your current medications as directed. Please refer to the Current Medication list given to you today.  Your physician wants you to follow-up in: 1 year with Dr.Smith You will receive a reminder letter in the mail two months in advance. If you don't receive a letter, please call our office to schedule the follow-up appointment.  

## 2014-04-19 ENCOUNTER — Telehealth: Payer: Self-pay

## 2014-04-19 ENCOUNTER — Ambulatory Visit (INDEPENDENT_AMBULATORY_CARE_PROVIDER_SITE_OTHER): Payer: Medicare Other | Admitting: Internal Medicine

## 2014-04-19 ENCOUNTER — Encounter: Payer: Self-pay | Admitting: Internal Medicine

## 2014-04-19 VITALS — BP 148/68 | HR 89 | Temp 97.7°F | Ht 63.0 in | Wt 158.0 lb

## 2014-04-19 DIAGNOSIS — I481 Persistent atrial fibrillation: Secondary | ICD-10-CM

## 2014-04-19 DIAGNOSIS — Z0000 Encounter for general adult medical examination without abnormal findings: Secondary | ICD-10-CM

## 2014-04-19 DIAGNOSIS — F39 Unspecified mood [affective] disorder: Secondary | ICD-10-CM

## 2014-04-19 DIAGNOSIS — Z23 Encounter for immunization: Secondary | ICD-10-CM

## 2014-04-19 DIAGNOSIS — E785 Hyperlipidemia, unspecified: Secondary | ICD-10-CM

## 2014-04-19 DIAGNOSIS — I4819 Other persistent atrial fibrillation: Secondary | ICD-10-CM

## 2014-04-19 DIAGNOSIS — M4806 Spinal stenosis, lumbar region: Secondary | ICD-10-CM

## 2014-04-19 DIAGNOSIS — Z7189 Other specified counseling: Secondary | ICD-10-CM

## 2014-04-19 DIAGNOSIS — J438 Other emphysema: Secondary | ICD-10-CM

## 2014-04-19 DIAGNOSIS — M48062 Spinal stenosis, lumbar region with neurogenic claudication: Secondary | ICD-10-CM

## 2014-04-19 DIAGNOSIS — I5032 Chronic diastolic (congestive) heart failure: Secondary | ICD-10-CM

## 2014-04-19 DIAGNOSIS — D649 Anemia, unspecified: Secondary | ICD-10-CM

## 2014-04-19 NOTE — Assessment & Plan Note (Signed)
Ongoing anxiety with dysthymia at times Doing well now on current regimen

## 2014-04-19 NOTE — Progress Notes (Signed)
Pre visit review using our clinic review tool, if applicable. No additional management support is needed unless otherwise documented below in the visit note. 

## 2014-04-19 NOTE — Assessment & Plan Note (Signed)
No problems with statin for secondary prevention Due for labs

## 2014-04-19 NOTE — Addendum Note (Signed)
Addended by: Despina Hidden on: 04/19/2014 05:09 PM   Modules accepted: Orders

## 2014-04-19 NOTE — Progress Notes (Signed)
Subjective:    Patient ID: Eduardo [name], male    DOB: Dec 23, 1930, 80 y.o.   MRN: [id number]  HPI Here for Medicare wellness and follow up Reviewed form and advanced directives No tobacco now and no alcohol Just had recent fall--he turned too quick. No injury Hearing mildly down but okay Vision is okay No recent depressed mood (he relates this to the abilify) Still drives and helps with instrumental ADLs Reviewed his list of other doctors Tries to do some exercise--but is limited. Walks with cane for stability Memory is not great--- doesn't think he has major cognitive loss  Did get over the last respiratory illness Just "a virus"  Still limited Can only do a limited amount of exertion---then gets SOB Relates to the COPD  Recent cardiology visit No chest pain No dizziness or syncope Mild stable edema No palpitations  Seeing Dr Cephus Shelling for his anxiety Doing much better with her regimen  Current Outpatient Prescriptions on File Prior to Visit  Medication Sig Dispense Refill  . albuterol (PROAIR HFA) 108 (90 BASE) MCG/ACT inhaler Inhale 2 puffs into the lungs every 6 (six) hours as needed for wheezing or shortness of breath. 3 Inhaler 3  . ARIPiprazole (ABILIFY) 5 MG tablet Take 5 mg by mouth daily.    Marland Kitchen aspirin 81 MG tablet Take 81 mg by mouth daily.      Marland Kitchen atorvastatin (LIPITOR) 10 MG tablet TAKE 1 TABLET BY MOUTH DAILY 90 tablet 3  . Azelastine HCl (ASTEPRO) 0.15 % SOLN USE 2 SPRAYS IN EACH NOSTRIL TWICE A DAY 90 mL 1  . cetirizine (ZYRTEC) 10 MG tablet Take 10 mg by mouth daily.    Marland Kitchen escitalopram (LEXAPRO) 20 MG tablet Take 20 mg by mouth daily.     . fenofibrate 160 MG tablet Take 1 tablet (160 mg total) by mouth daily. 90 tablet 0  . Fluticasone Furoate-Vilanterol (BREO ELLIPTA) 100-25 MCG/INH AEPB Inhale 1 puff into the lungs daily. 3 each 3  . montelukast (SINGULAIR) 10 MG tablet TAKE ONE TABLET BY MOUTH EVERY NIGHT AT BEDTIME 30 tablet 5  . Multiple  Vitamin (MULTIVITAMIN) tablet Take 1 tablet by mouth daily.    . verapamil (VERELAN PM) 240 MG 24 hr capsule Take 1 capsule (240 mg total) by mouth at bedtime. 90 capsule 4   No current facility-administered medications on file prior to visit.    Allergies  Allergen Reactions  . Penicillins Itching  . Sulfonamide Derivatives Other (See Comments)    "it affected my kidneys"    Past Medical History  Diagnosis Date  . Anemia   . Anxiety   . Atrial fibrillation   . CAD (coronary artery disease)   . Hyperlipidemia   . COPD (chronic obstructive pulmonary disease)   . Allergy   . Osteoarthritis   . Hx of colonic polyps   . Hyperplastic polyp of intestine 2001  . Adenomatous polyp 2001  . Hypertension 12/25/11    pt denies this history  . Myocardial infarction 06/2010    "twice; 1 day apart; during/after cath"  . Exertional dyspnea   . S/P appy   . Arrhythmia     Past Surgical History  Procedure Laterality Date  . Appendectomy    . Carpal tunnel release      left  . Mastoidectomy      x3 on right  . Pilonidal cyst / sinus excision    . Knee arthroscopy      left, right  knee 10/2009  . Shoulder arthroscopy      right  . Foot fracture surgery      right heel repair  . Nasal septum surgery      nasal septal repair  . Penile prosthesis implant      x 2--got infection after 2nd and had to remove  . Cataract extraction w/ intraocular lens implant  2/13    Dr [name]  . Carpal tunnel release  10/11/11    Dr Rush Barer  . Tonsillectomy and adenoidectomy    . Fracture surgery    . Cardiac catheterization  2012    50% LAD and luminal irreg in RCA, 1/12  Post cath MI from damage  . Penile prosthesis  removal      Family History  Problem Relation Age of Onset  . Diabetes Neg Hx   . Hypertension Neg Hx   . Heart attack Father   . Cancer Mother     ? leukemia    History   Social History  . Marital Status: Married    Spouse Name: N/A    Number of  Children: 4  . Years of Education: N/A   Occupational History  . Retired Chief Strategy Officer the job trainin)    Social History Main Topics  . Smoking status: Former Smoker -- 2.50 packs/day for 30 years    Types: Cigarettes    Quit date: 06/11/1978  . Smokeless tobacco: Never Used  . Alcohol Use: Yes     Comment: [id number] alcoholic  . Drug Use: No  . Sexual Activity: Not Currently   Other Topics Concern  . Not on file   Social History Narrative   Grew up in [place]   Married---2nd   2 children ([place], [place]  and near [place])--2 stepchildren   Former Smoker--quit in 1980   Alcohol use-no. Quit in 1980--alcoholic      Has living will.   Has designated wife, then step daughter [name] have health care POA.   Would accept resuscitation attempts.   No feeding tube if cognitively unaware.            Review of Systems Sleep is good initially. Will awaken early and have some coffee. Then goes back to bed for 3-4 hours Appetite is good Bowels are fine    Objective:   Physical Exam  Constitutional: He is oriented to person, place, and time. He appears well-developed and well-nourished. No distress.  HENT:  Mouth/Throat: Oropharynx is clear and moist. No oropharyngeal exudate.  Neck: Normal range of motion. Neck supple. No thyromegaly present.  Cardiovascular: Normal rate and normal heart sounds.  Exam reveals no gallop.   No murmur heard. Irregular  Feet cool. Faint pulse in right foot, absent in left  Abdominal: Soft. There is no tenderness.  Musculoskeletal: He exhibits no edema or tenderness.  Lymphadenopathy:    He has no cervical adenopathy.  Neurological: He is alert and oriented to person, place, and time.  President - "Elyn Peers, [place]" [phone] [id number] Recall 2/3  Skin: No rash noted. No erythema.  Psychiatric: He has a normal mood and affect. His behavior is normal.          Assessment & Plan:

## 2014-04-19 NOTE — Assessment & Plan Note (Signed)
I have personally reviewed the Medicare Annual Wellness questionnaire and have noted 1. The patient's medical and social history 2. Their use of alcohol, tobacco or illicit drugs 3. Their current medications and supplements 4. The patient's functional ability including ADL's, fall risks, home safety risks and hearing or visual             impairment. 5. Diet and physical activities 6. Evidence for depression or mood disorders  The patients weight, height, BMI and visual acuity have been recorded in the chart I have made referrals, counseling and provided education to the patient based review of the above and I have provided the pt with a written personalized care plan for preventive services.  I have provided you with a copy of your personalized plan for preventive services. Please take the time to review along with your updated medication list.  Needs prevnar No cancer screening due to age UTD otherwise

## 2014-04-19 NOTE — Assessment & Plan Note (Signed)
Has been compensated Rate related and from anemia

## 2014-04-19 NOTE — Assessment & Plan Note (Signed)
Chronic  Will recheck labs

## 2014-04-19 NOTE — Assessment & Plan Note (Signed)
See social history 

## 2014-04-19 NOTE — Assessment & Plan Note (Signed)
Stable but still limits him No med changes needed

## 2014-04-19 NOTE — Telephone Encounter (Signed)
We discussed this Okay to do labs at visit --was 5 by then

## 2014-04-19 NOTE — Telephone Encounter (Signed)
Pt left v/m; pt was advised not to eat lunch today; pt has medicare wellness appt today at 3:45 PM. Pt will need to eat lunch so he can take his medicine and pt will come back tomorrow morning for lab testing. Pt does not require cb; just wanted Dr [name] to know what he was going to do.

## 2014-04-19 NOTE — Assessment & Plan Note (Signed)
Good rate control Asa only due to frequent falls in past

## 2014-04-19 NOTE — Assessment & Plan Note (Signed)
This also limits him No pain issues lately though

## 2014-04-20 LAB — LIPID PANEL
CHOL/HDL RATIO: 5
CHOLESTEROL: 186 mg/dL (ref 0–200)
HDL: 38.1 mg/dL — AB (ref >39.00)
NonHDL: 147.9
Triglycerides: 873 mg/dL — ABNORMAL HIGH (ref 0.0–149.0)
VLDL: 174.6 mg/dL — AB (ref 0.0–40.0)

## 2014-04-20 LAB — COMPREHENSIVE METABOLIC PANEL
ALT: 28 U/L (ref 0–53)
AST: 43 U/L — ABNORMAL HIGH (ref 0–37)
Albumin: 3.8 g/dL (ref 3.5–5.2)
Alkaline Phosphatase: 48 U/L (ref 39–117)
BUN: 27 mg/dL — ABNORMAL HIGH (ref 6–23)
CO2: 23 meq/L (ref 19–32)
Calcium: 10.3 mg/dL (ref 8.4–10.5)
Chloride: 108 mEq/L (ref 96–112)
Creatinine, Ser: 1 mg/dL (ref 0.4–1.5)
GFR: 72.85 mL/min (ref >60.00)
GLUCOSE: 75 mg/dL (ref 70–99)
Potassium: 5.3 mEq/L — ABNORMAL HIGH (ref 3.5–5.1)
Sodium: 144 mEq/L (ref 135–145)
Total Bilirubin: 0.7 mg/dL (ref 0.2–1.2)
Total Protein: 7.4 g/dL (ref 6.0–8.3)

## 2014-04-20 LAB — CBC WITH DIFFERENTIAL/PLATELET
BASOS PCT: 0.8 % (ref 0.0–3.0)
Basophils Absolute: 0.1 10*3/uL (ref 0.0–0.1)
EOS PCT: 4.9 % (ref 0.0–5.0)
Eosinophils Absolute: 0.4 10*3/uL (ref 0.0–0.7)
HEMATOCRIT: 38.8 % — AB (ref 39.0–52.0)
Hemoglobin: 12.6 g/dL — ABNORMAL LOW (ref 13.0–17.0)
LYMPHS ABS: 1.9 10*3/uL (ref 0.7–4.0)
Lymphocytes Relative: 21.9 % (ref 12.0–46.0)
MCHC: 32.6 g/dL (ref 30.0–36.0)
MCV: 86.7 fl (ref 78.0–100.0)
MONO ABS: 0.6 10*3/uL (ref 0.1–1.0)
MONOS PCT: 7.4 % (ref 3.0–12.0)
NEUTROS PCT: 65 % (ref 43.0–77.0)
Neutro Abs: 5.6 10*3/uL (ref 1.4–7.7)
PLATELETS: 254 10*3/uL (ref 150.0–400.0)
RBC: 4.48 Mil/uL (ref 4.22–5.81)
RDW: 14.2 % (ref 11.5–15.5)
WBC: 8.6 10*3/uL (ref 4.0–10.5)

## 2014-04-20 LAB — T4, FREE: Free T4: 0.75 ng/dL (ref 0.60–1.60)

## 2014-04-21 LAB — LDL CHOLESTEROL, DIRECT: Direct LDL: 114.2 mg/dL

## 2014-04-29 ENCOUNTER — Emergency Department: Payer: Self-pay | Admitting: Emergency Medicine

## 2014-05-27 ENCOUNTER — Ambulatory Visit (INDEPENDENT_AMBULATORY_CARE_PROVIDER_SITE_OTHER): Payer: Medicare Other | Admitting: Internal Medicine

## 2014-05-27 ENCOUNTER — Encounter: Payer: Self-pay | Admitting: Internal Medicine

## 2014-05-27 VITALS — BP 138/70 | HR 81 | Temp 98.0°F | Wt 164.0 lb

## 2014-05-27 DIAGNOSIS — M4806 Spinal stenosis, lumbar region: Secondary | ICD-10-CM

## 2014-05-27 DIAGNOSIS — M48062 Spinal stenosis, lumbar region with neurogenic claudication: Secondary | ICD-10-CM

## 2014-05-27 NOTE — Progress Notes (Signed)
Pre visit review using our clinic review tool, if applicable. No additional management support is needed unless otherwise documented below in the visit note. 

## 2014-05-27 NOTE — Progress Notes (Signed)
Subjective:    Patient ID: Eduardo [name], male    DOB: [birth year], 80 y.o.   MRN: [id number]  HPI Here with wife to discuss surgery options for spinal stenosis  Has improved some with PT The therapist thinks he is plateaued She thinks orthotics might help him--no foot drop though She thought he might need MRI and surgery for the spinal stenosis  Currently can walk about 1 block--then has leg pain He will rest and then can go again Not sure how limited he would be just from his COPD Only mild low back arthritis Does have leg weakness and decreased sensation in feet  Current Outpatient Prescriptions on File Prior to Visit  Medication Sig Dispense Refill  . albuterol (PROAIR HFA) 108 (90 BASE) MCG/ACT inhaler Inhale 2 puffs into the lungs every 6 (six) hours as needed for wheezing or shortness of breath. 3 Inhaler 3  . aspirin 81 MG tablet Take 81 mg by mouth daily.      Marland Kitchen atorvastatin (LIPITOR) 10 MG tablet TAKE 1 TABLET BY MOUTH DAILY 90 tablet 3  . Azelastine HCl (ASTEPRO) 0.15 % SOLN USE 2 SPRAYS IN EACH NOSTRIL TWICE A DAY 90 mL 1  . cetirizine (ZYRTEC) 10 MG tablet Take 10 mg by mouth daily.    Marland Kitchen escitalopram (LEXAPRO) 20 MG tablet Take 20 mg by mouth daily.     . fenofibrate 160 MG tablet Take 1 tablet (160 mg total) by mouth daily. 90 tablet 0  . Fluticasone Furoate-Vilanterol (BREO ELLIPTA) 100-25 MCG/INH AEPB Inhale 1 puff into the lungs daily. 3 each 3  . montelukast (SINGULAIR) 10 MG tablet TAKE ONE TABLET BY MOUTH EVERY NIGHT AT BEDTIME 30 tablet 5  . Multiple Vitamin (MULTIVITAMIN) tablet Take 1 tablet by mouth daily.    . verapamil (VERELAN PM) 240 MG 24 hr capsule Take 1 capsule (240 mg total) by mouth at bedtime. 90 capsule 4   No current facility-administered medications on file prior to visit.    Allergies  Allergen Reactions  . Penicillins Itching  . Sulfonamide Derivatives Other (See Comments)    "it affected my kidneys"    Past Medical History    Diagnosis Date  . Anemia   . Anxiety   . Atrial fibrillation   . CAD (coronary artery disease)   . Hyperlipidemia   . COPD (chronic obstructive pulmonary disease)   . Allergy   . Osteoarthritis   . Hx of colonic polyps   . Hyperplastic polyp of intestine 2001  . Adenomatous polyp 2001  . Hypertension 12/25/11    pt denies this history  . Myocardial infarction 06/2010    "twice; 1 day apart; during/after cath"  . Exertional dyspnea   . S/P appy   . Arrhythmia     Past Surgical History  Procedure Laterality Date  . Appendectomy    . Carpal tunnel release      left  . Mastoidectomy      x3 on right  . Pilonidal cyst / sinus excision    . Knee arthroscopy      left, right knee 10/2009  . Shoulder arthroscopy      right  . Foot fracture surgery      right heel repair  . Nasal septum surgery      nasal septal repair  . Penile prosthesis implant      x 2--got infection after 2nd and had to remove  . Cataract extraction w/ intraocular lens implant  2/13  Dr [name]  . Carpal tunnel release  10/11/11    Dr Rush Barer  . Tonsillectomy and adenoidectomy    . Fracture surgery    . Cardiac catheterization  2012    50% LAD and luminal irreg in RCA, 1/12  Post cath MI from damage  . Penile prosthesis  removal      Family History  Problem Relation Age of Onset  . Diabetes Neg Hx   . Hypertension Neg Hx   . Heart attack Father   . Cancer Mother     ? leukemia    History   Social History  . Marital Status: Married    Spouse Name: N/A    Number of Children: 4  . Years of Education: N/A   Occupational History  . Retired Chief Strategy Officer the job trainin)    Social History Main Topics  . Smoking status: Former Smoker -- 2.50 packs/day for 30 years    Types: Cigarettes    Quit date: 06/11/1978  . Smokeless tobacco: Never Used  . Alcohol Use: Yes     Comment: [id number] alcoholic  . Drug Use: No  . Sexual Activity: Not Currently   Other Topics  Concern  . Not on file   Social History Narrative   Grew up in [place]   Married---2nd   2 children ([place], [place]  and near [place])--2 stepchildren   Former Smoker--quit in 1980   Alcohol use-no. Quit in 1980--alcoholic      Has living will.   Has designated wife, then step daughter [name] have health care POA.   Would accept resuscitation attempts.   No feeding tube if cognitively unaware.            Review of Systems  No problems with bowels--no loss of control Bladder control is fine     Objective:   Physical Exam  Constitutional: He appears well-developed and well-nourished. No distress.  Musculoskeletal:  Fairly normal arches--symmetric No spine tenderness  Neurological:  Gait is stiff but otherwise normal No focal leg weakness but can't lift up on toes           Assessment & Plan:

## 2014-05-27 NOTE — Assessment & Plan Note (Signed)
Discussed the seriousness of his stenosis---limited but still functional Multiple comorbidities that would complicate his surgical risk I am not excited about surgery They will think about it---thought there was an easy laser Rx (I have never heard of this) If he is interested in pursuing this--will check MRI

## 2014-06-01 ENCOUNTER — Other Ambulatory Visit: Payer: Self-pay

## 2014-06-01 MED ORDER — FENOFIBRATE 160 MG PO TABS
160.0000 mg | ORAL_TABLET | Freq: Every day | ORAL | Status: DC
Start: 2014-06-01 — End: 2014-09-21

## 2014-06-11 DIAGNOSIS — I639 Cerebral infarction, unspecified: Secondary | ICD-10-CM

## 2014-06-11 HISTORY — DX: Cerebral infarction, unspecified: I63.9

## 2014-06-17 ENCOUNTER — Other Ambulatory Visit: Payer: Self-pay

## 2014-06-17 MED ORDER — ATORVASTATIN CALCIUM 10 MG PO TABS
ORAL_TABLET | ORAL | Status: DC
Start: 2014-06-17 — End: 2014-12-31

## 2014-07-27 ENCOUNTER — Telehealth: Payer: Self-pay | Admitting: Emergency Medicine

## 2014-07-27 MED ORDER — FLUTICASONE FUROATE-VILANTEROL 100-25 MCG/INH IN AEPB: 1.0000 | INHALATION_SPRAY | Freq: Every day | RESPIRATORY_TRACT | Status: DC

## 2014-07-27 NOTE — Telephone Encounter (Signed)
Pt has pending OV on 08/27/14 with RB. Advised pt that I would send in enough medication to last until his appointment. This has been done. Nothing further was needed.

## 2014-08-25 ENCOUNTER — Other Ambulatory Visit: Payer: Self-pay

## 2014-08-25 ENCOUNTER — Telehealth: Payer: Self-pay | Admitting: *Deleted

## 2014-08-25 MED ORDER — VERAPAMIL HCL ER 240 MG PO CP24: 240.0000 mg | ORAL_CAPSULE | Freq: Every day | ORAL | Status: DC

## 2014-08-25 NOTE — Telephone Encounter (Signed)
He needs to be on Verapamil SR 240 mg daily

## 2014-08-25 NOTE — Telephone Encounter (Signed)
Pharmacist from [facility] called and states that the verelan pm does not come in a 240mg  capsule. It only comes in 100, 200, and 300mg . The plain verapamil does come in 240mg . Please advise. Thanks, MI

## 2014-08-25 NOTE — Telephone Encounter (Signed)
Routed to Dr.Smith to advise 

## 2014-08-26 ENCOUNTER — Other Ambulatory Visit: Payer: Self-pay | Admitting: *Deleted

## 2014-08-26 MED ORDER — VERAPAMIL HCL ER 240 MG PO TBCR: 240.0000 mg | EXTENDED_RELEASE_TABLET | Freq: Every day | ORAL | Status: DC

## 2014-08-27 ENCOUNTER — Ambulatory Visit (INDEPENDENT_AMBULATORY_CARE_PROVIDER_SITE_OTHER): Payer: PPO | Admitting: Emergency Medicine

## 2014-08-27 ENCOUNTER — Encounter: Payer: Self-pay | Admitting: Emergency Medicine

## 2014-08-27 VITALS — BP 110/82 | HR 66 | Ht 63.0 in | Wt 159.0 lb

## 2014-08-27 DIAGNOSIS — J438 Other emphysema: Secondary | ICD-10-CM

## 2014-08-27 DIAGNOSIS — J309 Allergic rhinitis, unspecified: Secondary | ICD-10-CM

## 2014-08-27 NOTE — Patient Instructions (Signed)
Please continue your Breo every day Use your nasal saline washes and Zyrtec daily Try using your Incentive Spirometer 2-3 times a day, 10 breaths each repetition.  Also work on using pursed-lip breathing Follow with Dr Lamonte Sakai in 6 -9 months or sooner if you have any problems

## 2014-08-27 NOTE — Assessment & Plan Note (Signed)
This is been a persistent and severe problem but he is fairly well controlled at this time. He is using nasal saline washes frequently as well as Zyrtec. He's no longer on Singulair.

## 2014-08-27 NOTE — Progress Notes (Signed)
Subjective:    Patient ID: Eduardo [name], male    DOB: Dec 26, 1930, 81 y.o.   MRN: [id number] HPI 81 yo man, former smoker, CAD s/p MI 1/12, A Fib, allergies, exertional SOB for a few yrs. This has been worse since recent hosp in 1/12 - was cathed x 2, no intervention made, course c/b renal failure. 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 No hypoxemia reported with exercise. Was sent from the hospital on metoprolol (new). He has heard wheezing, especially when nasal allergies. Arlyce Harman at Dr Alla German office suggested AFL/COPD.   ROV 09/22/10 -- f/u SOB due to presumed COPD, CAD/MI, A Fib. Last time we started prn SABA to see if he would benefit - may have helped him some. He had full PFT's (09/18/10) . Dr Tamala Julian changed his metoprolol to verapamil but no change in breathing; he did get LE edema. He continues to have wheeze, cough, DOE. Also currently having severe PND and feels that it is contributing to his trouble. Taking allegra qd.  ROV 11/15/10 -- follow up for multifactorial dyspnea, CAD, probable COPD, allergic rhinitis. Returns today after we started Symbicort at last visit. He didn't fel that the SymbicorContinues to do Rehab at [place]. The therapists were concerned that he was having exertional dyspnea, having to stop during workouts. Over last few days more allergy sx, on fexafenadine.   ROV 12/15/10 -- probable COPD, CAD, allergic rhinitis. He didn't improve w Symbicort, so we started Spiriva last visit. He believes that his breathing is a bit better. He has done better at Rehab on eliptical and treadmill. No hypoxemia. He is taking both allegra and zyrtec for allergies, as well as nasonex - still has severe PND and associated cough.   ROB 01/29/11 -- probable COPD, CAD, allergic rhinitis. Last time we discussed change from nasal steroid to astelin + NSW + zyrtec. He did not make the change. He isn't having the same degree of cough, but still with nasal gtt. Remains on Spiriva and  doing well.   ROV 05/14/11 -- probable COPD, CAD, allergic rhinitis. Returns for f/u. Last time changed nasal steroid to astepro. Still has significant nasal gtt, wheeze in the am, clearing mucous from throat. Arlyce Harman 4/12 was not impressive for significant AFL.   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 Was recently hospitalized with acute on chronic anemia while on coumadin. Also some degree of diastolic CHF and pulmonary edema. Feels much stronger, breathing is better. Occult blood negative. Fe started during the hosp.   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 Also hx chronic anemia on Fe. Returns f/u. He reports that his last Hgb w Dr Tamala Julian was 96. He is able to exert, but has some DOE after a block. Remains on Spiriva, uses SABA prn a couple times a day.  Lots of mucous and sinus drainage - taking zyrtec, NSW, astepro. Doing NSW before bed and then getting drainage to throat.   ROV 11/07/12 -- COPD, CAD, allergic rhinitis, chronic anemia. He reports that he has been having more problems at night - feels a drainage, a globus sensation, wheezing, coughing. No real snoring. He tries to clear his throat but there is nothing there. He stopped doing nasal saline washes months ago, still on zyrtec, astepro. Dr [name] added singulair recently.   ROV 02/06/13 -- COPD, CAD, allergic rhinitis, chronic anemia. He is still having trouble with cough especially at night. Has been doing  nasal saline at night. He had a 6 minute walk 8/28, walked shorter distance than he did previously, feels more SOB, has neuropathic pain. He is on spiriva, he is using albuterol 2x a day. Nasal congestion - on astepro qhs  ROV 03/05/13 -- COPD, CAD, allergic rhinitis, chronic anemia. Last time he was having more dyspnea, more cough. We tried adding Breo to Spiriva to see if he would benefit > his dyspnea and wheezing are both better. We discussed increasing  astepro but he hasn't needed to. He changed NSW to qam from bedtime.   ROV 07/15/13 -- COPD, CAD, allergic rhinitis, chronic anemia on Breo and Spiriva. He was seen 2/2 by PCP with URI sx > lots of nasal gtt, cough non-productive, some low grade fever. He was started on doxy + prednisone. The prednisone has helped him some. He is still having drainage. Remains on singulair, zyrtec, astepro nasal spray. Not using [place] regularly.   ROV 01/01/14 -- COPD, CAD, allergic rhinitis, chronic anemia on Breo and Spiriva. He has been doing very well, especially since change to [facility]. He has been active, breathing is improved. He is doing well on his usual allergy regimen.   ROV 08/27/14 -- follow up visit for COPD and allergic rhinitis, cough. He tells me that he has continued to have a lot of mucous - uses NSW's regularly. He does build up mucous overnight but it doesn't wake him. He is congested in the am. He is on zyrtec, no longer on singulair.  We discussed pursed lip breathing and incentive spirometry today. He is on Breo and tolerates well. His activity level is pretty good but spinal stenosis does slow him down. He works with a Clinical research associate 2x a week. No exacerbations since last time.   Review of Systems As per HPI    Objective:  Physical Exam Filed Vitals:   08/27/14 1042 08/27/14 1043  BP:  110/82  Pulse:  66  Height: 5\' 3"  (1.6 m)   Weight: 159 lb (72.122 kg)   SpO2:  96%   Gen: Pleasant, well-nourished, in no distress,  normal affect  ENT: No lesions,  mouth clear,  oropharynx clear, no postnasal drip  Neck: No JVD, insp and exp stridor  Lungs: No use of accessory muscles, referred UA noises.   Cardiovascular: RRR, heart sounds normal, no murmur or gallops, no peripheral edema  Musculoskeletal: No deformities, no cyanosis or clubbing  Neuro: alert, non focal  Skin: Warm, no lesions or rashes  Assessment & Plan:  COPD (chronic obstructive pulmonary disease) with emphysema He has severe  obstructive lung disease but has been remarkably functional and continues to exert. He is tolerating Breo. He had questions today about any type of breathing exercises that he may be able to do so we reviewed pursed lip breathing and also incentive spirometry.  - Plan to continue Breo as ordered - albuterol prn - rov 6-9 months   Allergic rhinitis This is been a persistent and severe problem but he is fairly well controlled at this time. He is using nasal saline washes frequently as well as Zyrtec. He's no longer on Singulair.

## 2014-08-27 NOTE — Assessment & Plan Note (Signed)
He has severe obstructive lung disease but has been remarkably functional and continues to exert. He is tolerating Breo. He had questions today about any type of breathing exercises that he may be able to do so we reviewed pursed lip breathing and also incentive spirometry.  - Plan to continue Breo as ordered - albuterol prn - rov 6-9 months

## 2014-09-01 ENCOUNTER — Other Ambulatory Visit: Payer: Self-pay | Admitting: Emergency Medicine

## 2014-09-02 ENCOUNTER — Telehealth: Payer: Self-pay | Admitting: Emergency Medicine

## 2014-09-02 MED ORDER — FLUTICASONE FUROATE-VILANTEROL 100-25 MCG/INH IN AEPB
1.0000 | INHALATION_SPRAY | Freq: Every day | RESPIRATORY_TRACT | Status: DC
Start: 2014-09-02 — End: 2015-01-05

## 2014-09-02 NOTE — Telephone Encounter (Signed)
Rx for [facility] has been sent in for 90 day supply with years worth of refills. Sample has been placed up front for pick up. Pt is aware. Nothing further was needed.

## 2014-09-21 ENCOUNTER — Other Ambulatory Visit: Payer: Self-pay | Admitting: *Deleted

## 2014-09-21 MED ORDER — FENOFIBRATE 160 MG PO TABS: 160.0000 mg | ORAL_TABLET | Freq: Every day | ORAL | Status: DC

## 2014-10-03 NOTE — H&P (Signed)
PATIENT NAME:  Eduardo [name], DIAB MR#:  [id number] DATE OF BIRTH:  1930/07/27  DATE OF ADMISSION:  09/08/2011  PRIMARY CARE PHYSICIAN: Dr. [name]  CARDIOLOGIST: Dr. Daneen Schick at [facility] in [place]: Chest pain.   HISTORY OF PRESENT ILLNESS: This is a 81 year old man who presents to the Emergency Room with left-sided chest pain in the lower chest described as sharp, intermittent over a 45 per minute period lasting seconds to minutes at a time, 4 to 5/10 in intensity. Currently now no pain. He did have another pain he described in the upper chest near his neck as an ache, not associated with any diaphoresis. No new shortness of breath. No nausea. He took a nitroglycerin and it made no difference. Pain went away on its own. Hospitalist services were contacted for further evaluation.   PAST MEDICAL HISTORY:  1. Atrial fibrillation. 2. Chronic obstructive pulmonary disease. 3. Coronary artery disease. 4. Mild heart attack after a coronary dissection after coronary catheterization last year.  5. Hyperlipidemia.  6. depression.   PAST SURGICAL HISTORY:  1. Three mastoid surgeries on the right. 2. Deviated septum. 3. Appendix. 4. Pilonidal cyst. 5. Bilateral knee arthroscopy. 6. Rotator cuff surgery.   ALLERGIES: Sulfa and penicillin.   MEDICATIONS: As per patient's medication list include:  1. Warfarin 7.5 mg on Monday and Thursday, 5 mg the rest of the week.  2. Zyrtec 10 mg daily.  3. Verapamil 240 mg daily.  4. Lipitor 80 mg at bedtime.  5. Lexapro 30 mg daily. 6. TriCor 145 mg daily.  7. Wellbutrin XL 300 mg daily.  8. Omega-3, 4 pills a day. 9. Aspirin 81 mg daily.  10. ProAir as needed. 11. Astepro nasal spray twice a day.  12. Spiriva 1 inhalation daily.  13. Co-Q10 daily.  14. Saw palmetto daily. 15. Multivitamin with minerals daily.  16. Nettle Tea.    SOCIAL HISTORY: No smoking. No alcohol. No drug use. Used to work as an Chief Financial Officer.   FAMILY  HISTORY: Mother died at 21, had leukemia and also colon cancer. Father died at 38 of a myocardial infarction.   REVIEW OF SYSTEMS: CONSTITUTIONAL: No fever, chills, or sweats. No weight loss. No weight gain. No weakness or fatigue. EYES: Does wear glasses. EARS, NOSE, MOUTH, AND THROAT: No hearing loss. No sore throat. No difficulty swallowing. CARDIOVASCULAR: Positive for chest pain. No palpitations. RESPIRATORY: Occasional shortness of breath with chronic obstructive pulmonary disease and wheeze. No coughing. No sputum. No hemoptysis. GASTROINTESTINAL: No nausea. No vomiting. No abdominal pain. No diarrhea. No constipation. No bright red blood per rectum. No melena. GENITOURINARY: No burning on urination. No hematuria. MUSCULOSKELETAL: No joint pain or muscle pain. INTEGUMENT: No rashes or eruptions. NEUROLOGIC: No fainting or blackouts. PSYCHIATRIC: No anxiety or depression. ENDOCRINE: No thyroid problems. HEMATOLOGIC/LYMPHATIC: No anemia. No easy bruising or bleeding.   PHYSICAL EXAMINATION:  VITAL SIGNS: Temperature 95.6, pulse 75, respirations 16, blood pressure 148/72, pulse oximetry 98%.   GENERAL: No respiratory distress.   EYES: Conjunctivae and lids normal. Pupils equal, round, and reactive to light. Extraocular muscles intact. No nystagmus.   EARS, NOSE, MOUTH, AND THROAT: Tympanic membranes no erythema. Nasal mucosa no erythema. Throat no erythema. No exudate seen. Lips and gums no lesions.   NECK: No JVD. No bruits. No lymphadenopathy. No thyromegaly. No thyroid nodules palpated.   RESPIRATORY: Lungs clear to auscultation. No use of accessory muscles to breathe. No rhonchi, rales, or wheeze heard.   CARDIOVASCULAR: S1,  S2 normal. No gallops, rubs, or murmurs heard. Carotid upstroke 2+ bilaterally. No bruits.   EXTREMITIES: Dorsalis pedis pulses 2+ bilaterally. No edema of the lower extremity. Pulse is irregular, irregular but rate controlled.   ABDOMEN: Soft, nontender. No  organomegaly/splenomegaly. Normoactive bowel sounds. No masses felt.   LYMPHATIC: No lymph nodes in the neck.   MUSCULOSKELETAL: No clubbing, edema, or cyanosis.   SKIN: No ulcers or lesions.   NEUROLOGIC: Cranial nerves II-XII grossly intact. Deep tendon reflexes 2+ bilateral lower extremities.   PSYCHIATRIC: Patient is oriented to person, place, and time.   LABORATORY, DIAGNOSTIC, AND RADIOLOGICAL DATA: EKG: Atrial fibrillation, nonspecific ST-T wave changes. White blood cell count 8.8, hemoglobin and hematocrit 11.4 and 35.4, platelet count 203, glucose 97, BUN 20, creatinine 0.98, sodium 142, potassium 3.9, chloride 105, CO2 26, calcium 9.0. Troponin negative. INR 2.1.     ASSESSMENT AND PLAN:  1. Chest pain, atypical. Will admit as an observation. Get serial cardiac enzymes to rule out myocardial infarction. Hopefully will be able to be discharged in the morning. Unable to do stress test on Sunday. They would rather follow up with her cardiologist as outpatient.  2. Atrial fibrillation, rate controlled on verapamil. INR therapeutic.  3. Chronic obstructive pulmonary disease with wheeze. Continue Spiriva. Will give DuoNeb nebulizer solution while here.  4. Hyperlipidemia. Continue Lipitor and TriCor.  5. Depression. Continue Wellbutrin and Prozac.  6. Allergies. Continue Zyrtec   TIME SPENT ON ADMISSION: 55 minutes.  ____________________________ Tana Conch. Leslye Peer, MD rjw:cms D: 09/08/2011 13:08:34 ET T: 09/08/2011 13:24:36 ET JOB#: [id number]  cc: Tana Conch. Leslye Peer, MD, <Dictator> Venia Carbon, MD Dr. Daneen Schick at [facility] in [facility] MD ELECTRONICALLY SIGNED 09/09/2011 7:19

## 2014-10-03 NOTE — H&P (Signed)
PATIENT NAME:  Eduardo [name], Eduardo [name] MR#:  [id number] DATE OF BIRTH:  06-May-1931  DATE OF ADMISSION:  09/08/2011  ADDENDUM  PRIMARY CARE PHYSICIAN: Dr. [name] CARDIOLOGIST: Dr. Daneen Schick at [facility] in [place]   Prior to patient being admitted to the hospital the patient signed out [place]. They stated that he feels fine and they want to go home and follow up with their normal cardiologist. I told them it would probably be best to stay overnight and check the cardiac enzymes. They did not want to do that so I had them sign out [place]. Risk of death and heart attack explained. Patient still signed out [place].   ____________________________ Tana Conch. Leslye Peer, MD rjw:cms D: 09/08/2011 15:13:17 ET T: 09/08/2011 15:35:43 ET JOB#: [id number]  cc: Tana Conch. Leslye Peer, MD, <Dictator> Venia Carbon, MD Dr. Daneen Schick at [facility] in [facility] MD ELECTRONICALLY SIGNED 09/09/2011 7:19

## 2014-10-25 ENCOUNTER — Encounter: Payer: Self-pay | Admitting: Emergency Medicine

## 2014-10-25 ENCOUNTER — Ambulatory Visit (INDEPENDENT_AMBULATORY_CARE_PROVIDER_SITE_OTHER): Payer: PPO | Admitting: Emergency Medicine

## 2014-10-25 VITALS — BP 130/80 | HR 56 | Ht 62.65 in | Wt 161.0 lb

## 2014-10-25 DIAGNOSIS — J438 Other emphysema: Secondary | ICD-10-CM | POA: Diagnosis not present

## 2014-10-25 NOTE — Progress Notes (Signed)
Subjective:    Patient ID: Eduardo [name], male    DOB: 1930-07-22, 81 y.o.   MRN: [id number] HPI 81 yo man, former smoker, CAD s/p MI 1/12, A Fib, allergies, exertional SOB for a few yrs. This has been worse since recent hosp in 1/12 - was cathed x 2, no intervention made, course c/b renal failure. 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 No hypoxemia reported with exercise. Was sent from the hospital on metoprolol (new). He has heard wheezing, especially when nasal allergies. Arlyce Harman at Dr Alla German office suggested AFL/COPD.   ROV 09/22/10 -- f/u SOB due to presumed COPD, CAD/MI, A Fib. Last time we started prn SABA to see if he would benefit - may have helped him some. He had full PFT's (09/18/10) . Dr Tamala Julian changed his metoprolol to verapamil but no change in breathing; he did get LE edema. He continues to have wheeze, cough, DOE. Also currently having severe PND and feels that it is contributing to his trouble. Taking allegra qd.  ROV 11/15/10 -- follow up for multifactorial dyspnea, CAD, probable COPD, allergic rhinitis. Returns today after we started Symbicort at last visit. He didn't fel that the SymbicorContinues to do Rehab at [facility]. The therapists were concerned that he was having exertional dyspnea, having to stop during workouts. Over last few days more allergy sx, on fexafenadine.   ROV 12/15/10 -- probable COPD, CAD, allergic rhinitis. He didn't improve w Symbicort, so we started Spiriva last visit. He believes that his breathing is a bit better. He has done better at Rehab on eliptical and treadmill. No hypoxemia. He is taking both allegra and zyrtec for allergies, as well as nasonex - still has severe PND and associated cough.   ROB 01/29/11 -- probable COPD, CAD, allergic rhinitis. Last time we discussed change from nasal steroid to astelin + NSW + zyrtec. He did not make the change. He isn't having the same degree of cough, but still with nasal gtt. Remains on Spiriva and  doing well.   ROV 05/14/11 -- probable COPD, CAD, allergic rhinitis. Returns for f/u. Last time changed nasal steroid to astepro. Still has significant nasal gtt, wheeze in the am, clearing mucous from throat. Arlyce Harman 4/12 was not impressive for significant AFL.   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 Was recently hospitalized with acute on chronic anemia while on coumadin. Also some degree of diastolic CHF and pulmonary edema. Feels much stronger, breathing is better. Occult blood negative. Fe started during the hosp.   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 Also hx chronic anemia on Fe. Returns f/u. He reports that his last Hgb w Dr Tamala Julian was 61. He is able to exert, but has some DOE after a block. Remains on Spiriva, uses SABA prn a couple times a day.  Lots of mucous and sinus drainage - taking zyrtec, NSW, astepro. Doing NSW before bed and then getting drainage to throat.   ROV 11/07/12 -- COPD, CAD, allergic rhinitis, chronic anemia. He reports that he has been having more problems at night - feels a drainage, a globus sensation, wheezing, coughing. No real snoring. He tries to clear his throat but there is nothing there. He stopped doing nasal saline washes months ago, still on zyrtec, astepro. Dr [name] added singulair recently.   ROV 02/06/13 -- COPD, CAD, allergic rhinitis, chronic anemia. He is still having trouble with cough especially at night. Has been doing  nasal saline at night. He had a 6 minute walk 8/28, walked shorter distance than he did previously, feels more SOB, has neuropathic pain. He is on spiriva, he is using albuterol 2x a day. Nasal congestion - on astepro qhs  ROV 03/05/13 -- COPD, CAD, allergic rhinitis, chronic anemia. Last time he was having more dyspnea, more cough. We tried adding Breo to Spiriva to see if he would benefit > his dyspnea and wheezing are both better. We discussed increasing  astepro but he hasn't needed to. He changed NSW to qam from bedtime.   ROV 07/15/13 -- COPD, CAD, allergic rhinitis, chronic anemia on Breo and Spiriva. He was seen 2/2 by PCP with URI sx > lots of nasal gtt, cough non-productive, some low grade fever. He was started on doxy + prednisone. The prednisone has helped him some. He is still having drainage. Remains on singulair, zyrtec, astepro nasal spray. Not using [place] regularly.   ROV 01/01/14 -- COPD, CAD, allergic rhinitis, chronic anemia on Breo and Spiriva. He has been doing very well, especially since change to [facility]. He has been active, breathing is improved. He is doing well on his usual allergy regimen.   ROV 08/27/14 -- follow up visit for COPD and allergic rhinitis, cough. He tells me that he has continued to have a lot of mucous - uses NSW's regularly. He does build up mucous overnight but it doesn't wake him. He is congested in the am. He is on zyrtec, no longer on singulair.  We discussed pursed lip breathing and incentive spirometry today. He is on Breo and tolerates well. His activity level is pretty good but spinal stenosis does slow him down. He works with a Clinical research associate 2x a week. No exacerbations since last time.   ROV 10/25/14 -- follow-up visit for COPD (FEV1 1.03 L or 50% predicted, 09/2010), cough and allergic rhinitis. He reports today progressive dyspnea for last 3 months, happens when sleeping and sometimes when supine. His wife has also noted some exertional SOB, has to stop after 200 feet. He is on Breo, but we stopped spiriva. With these new sx he added back spiriva 4 days ago. He is still able to do his daily workouts, trainer 2x a week.  No wheeze or cough. His wife is interested in looking into getting a scooter for him.   Review of Systems As per HPI    Objective:  Physical Exam Filed Vitals:   10/25/14 1447  BP: 130/80  Pulse: 56  Height: 5' 2.65" (1.591 m)  Weight: 161 lb (73.029 kg)  SpO2: 96%   Gen: Pleasant,  well-nourished, in no distress,  normal affect  ENT: No lesions,  mouth clear,  oropharynx clear, no postnasal drip  Neck: No JVD, insp and exp stridor  Lungs: No use of accessory muscles, referred UA noises.   Cardiovascular: RRR, heart sounds normal, no murmur or gallops, no peripheral edema  Musculoskeletal: No deformities, no cyanosis or clubbing  Neuro: alert, non focal  Skin: Warm, no lesions or rashes   Assessment & Plan:  COPD (chronic obstructive pulmonary disease) with emphysema Severe obstructive lung disease. He has been amazingly functional for years despite his degree of obstruction. Sound like he is beginning to have decreased functional capacity. We did stop his Spiriva several months ago and I suspect that he does miss it. On walking oximetry today he had to stop after 2 laps around the office but he did not desaturate below 92%. He also mentions some  nocturnal symptoms and shallow breathing without any witnessed apneas or snoring.   We will restart his Spiriva Respimat version. Continue Breo. Use albuterol prn. Walking oximetry today was reassuring. Consider a PSG in the future depending on response to Spiriva.. We will assist him in obtaining a motorized scooter if he desires to do this

## 2014-10-25 NOTE — Assessment & Plan Note (Signed)
Severe obstructive lung disease. He has been amazingly functional for years despite his degree of obstruction. Sound like he is beginning to have decreased functional capacity. We did stop his Spiriva several months ago and I suspect that he does miss it. On walking oximetry today he had to stop after 2 laps around the office but he did not desaturate below 92%. He also mentions some nocturnal symptoms and shallow breathing without any witnessed apneas or snoring.   We will restart his Spiriva Respimat version. Continue Breo. Use albuterol prn. Walking oximetry today was reassuring. Consider a PSG in the future depending on response to Spiriva.. We will assist him in obtaining a motorized scooter if he desires to do this

## 2014-10-25 NOTE — Patient Instructions (Signed)
We will restart Spiriva, 2 sprays once a day (Respimat version) Continue breo every day.  Walking oximetry today Forward any information you collect about a scooter to our office and we will try to assist with this.  We may decide to consider a sleep test at some point in the future.  Follow with Dr Lamonte Sakai in 3 months or sooner if you have any problems.

## 2014-11-01 ENCOUNTER — Telehealth: Payer: Self-pay | Admitting: Emergency Medicine

## 2014-11-01 MED ORDER — TIOTROPIUM BROMIDE MONOHYDRATE 2.5 MCG/ACT IN AERS: 2.0000 | INHALATION_SPRAY | Freq: Every day | RESPIRATORY_TRACT | Status: DC

## 2014-11-01 NOTE — Telephone Encounter (Signed)
Per 10/25/14 OV: Patient Instructions       We will restart Spiriva, 2 sprays once a day (Respimat version) Continue breo every day.   Walking oximetry today Forward any information you collect about a scooter to our office and we will try to assist with this.   We may decide to consider a sleep test at some point in the future.  Follow with Dr Lamonte Sakai in 3 months or sooner if you have any problems  ---  Called pt and aware RX for spiriva resp has been sent for 90 days. Nothing further needed

## 2014-12-02 ENCOUNTER — Telehealth: Payer: Self-pay | Admitting: Internal Medicine

## 2014-12-02 DIAGNOSIS — M48062 Spinal stenosis, lumbar region with neurogenic claudication: Secondary | ICD-10-CM

## 2014-12-02 NOTE — Telephone Encounter (Signed)
Patient said Dr. [name] suggested he sees Dr. Sharlet Salina but he needs a referral.

## 2014-12-08 ENCOUNTER — Telehealth: Payer: Self-pay | Admitting: Emergency Medicine

## 2014-12-08 MED ORDER — ALBUTEROL SULFATE HFA 108 (90 BASE) MCG/ACT IN AERS: 2.0000 | INHALATION_SPRAY | Freq: Four times a day (QID) | RESPIRATORY_TRACT | Status: DC | PRN

## 2014-12-08 NOTE — Telephone Encounter (Signed)
Spoke with pt. Aware proair RX sent in. Nothing further needed

## 2014-12-23 ENCOUNTER — Encounter: Payer: Self-pay | Admitting: Gastroenterology

## 2014-12-30 ENCOUNTER — Inpatient Hospital Stay (HOSPITAL_COMMUNITY)
Admission: EM | Admit: 2014-12-30 | Discharge: 2014-12-31 | DRG: 065 | Disposition: A | Discharge status: Home / Self Care | Payer: PPO | Attending: Internal Medicine | Admitting: Internal Medicine

## 2014-12-30 ENCOUNTER — Encounter (HOSPITAL_COMMUNITY): Payer: Self-pay | Admitting: *Deleted

## 2014-12-30 DIAGNOSIS — R531 Weakness: Secondary | ICD-10-CM | POA: Diagnosis not present

## 2014-12-30 DIAGNOSIS — F419 Anxiety disorder, unspecified: Secondary | ICD-10-CM | POA: Diagnosis present

## 2014-12-30 DIAGNOSIS — M4806 Spinal stenosis, lumbar region: Secondary | ICD-10-CM | POA: Diagnosis present

## 2014-12-30 DIAGNOSIS — E785 Hyperlipidemia, unspecified: Secondary | ICD-10-CM | POA: Diagnosis present

## 2014-12-30 DIAGNOSIS — I482 Chronic atrial fibrillation, unspecified: Secondary | ICD-10-CM | POA: Diagnosis present

## 2014-12-30 DIAGNOSIS — M48062 Spinal stenosis, lumbar region with neurogenic claudication: Secondary | ICD-10-CM | POA: Diagnosis present

## 2014-12-30 DIAGNOSIS — I2510 Atherosclerotic heart disease of native coronary artery without angina pectoris: Secondary | ICD-10-CM | POA: Diagnosis present

## 2014-12-30 DIAGNOSIS — I5043 Acute on chronic combined systolic (congestive) and diastolic (congestive) heart failure: Secondary | ICD-10-CM | POA: Diagnosis present

## 2014-12-30 DIAGNOSIS — I5032 Chronic diastolic (congestive) heart failure: Secondary | ICD-10-CM | POA: Diagnosis present

## 2014-12-30 DIAGNOSIS — I63432 Cerebral infarction due to embolism of left posterior cerebral artery: Secondary | ICD-10-CM | POA: Diagnosis not present

## 2014-12-30 DIAGNOSIS — Z87891 Personal history of nicotine dependence: Secondary | ICD-10-CM

## 2014-12-30 DIAGNOSIS — Z880 Allergy status to penicillin: Secondary | ICD-10-CM

## 2014-12-30 DIAGNOSIS — I63412 Cerebral infarction due to embolism of left middle cerebral artery: Secondary | ICD-10-CM

## 2014-12-30 DIAGNOSIS — Z9841 Cataract extraction status, right eye: Secondary | ICD-10-CM

## 2014-12-30 DIAGNOSIS — I4891 Unspecified atrial fibrillation: Secondary | ICD-10-CM | POA: Diagnosis present

## 2014-12-30 DIAGNOSIS — G459 Transient cerebral ischemic attack, unspecified: Secondary | ICD-10-CM | POA: Diagnosis present

## 2014-12-30 DIAGNOSIS — Z7982 Long term (current) use of aspirin: Secondary | ICD-10-CM

## 2014-12-30 DIAGNOSIS — F39 Unspecified mood [affective] disorder: Secondary | ICD-10-CM | POA: Diagnosis present

## 2014-12-30 DIAGNOSIS — Z961 Presence of intraocular lens: Secondary | ICD-10-CM | POA: Diagnosis present

## 2014-12-30 DIAGNOSIS — J449 Chronic obstructive pulmonary disease, unspecified: Secondary | ICD-10-CM | POA: Diagnosis present

## 2014-12-30 DIAGNOSIS — Z882 Allergy status to sulfonamides status: Secondary | ICD-10-CM

## 2014-12-30 DIAGNOSIS — I10 Essential (primary) hypertension: Secondary | ICD-10-CM | POA: Diagnosis present

## 2014-12-30 DIAGNOSIS — D649 Anemia, unspecified: Secondary | ICD-10-CM | POA: Diagnosis present

## 2014-12-30 DIAGNOSIS — J439 Emphysema, unspecified: Secondary | ICD-10-CM | POA: Diagnosis present

## 2014-12-30 DIAGNOSIS — R29898 Other symptoms and signs involving the musculoskeletal system: Secondary | ICD-10-CM | POA: Diagnosis present

## 2014-12-30 NOTE — ED Notes (Signed)
Pt states around 2230 tonight pt had a sudden onset of right hand weakness. Pt's right hand grip is weaker than left. Pt states he usually has full function and full ROM in all fingers. Pt now unable to touch each finger to thumb. Pt denies any recent fall or injury to hand. No drift, no facial droop, lower extremities strong and equal. Pt denies pain.

## 2014-12-31 ENCOUNTER — Observation Stay (HOSPITAL_COMMUNITY): Payer: PPO

## 2014-12-31 ENCOUNTER — Emergency Department (HOSPITAL_COMMUNITY): Payer: PPO

## 2014-12-31 ENCOUNTER — Encounter (HOSPITAL_COMMUNITY): Payer: Self-pay | Admitting: General Practice

## 2014-12-31 DIAGNOSIS — I2510 Atherosclerotic heart disease of native coronary artery without angina pectoris: Secondary | ICD-10-CM | POA: Diagnosis present

## 2014-12-31 DIAGNOSIS — Z87891 Personal history of nicotine dependence: Secondary | ICD-10-CM | POA: Diagnosis not present

## 2014-12-31 DIAGNOSIS — R531 Weakness: Secondary | ICD-10-CM | POA: Diagnosis present

## 2014-12-31 DIAGNOSIS — J449 Chronic obstructive pulmonary disease, unspecified: Secondary | ICD-10-CM | POA: Diagnosis present

## 2014-12-31 DIAGNOSIS — Z961 Presence of intraocular lens: Secondary | ICD-10-CM | POA: Diagnosis present

## 2014-12-31 DIAGNOSIS — I5032 Chronic diastolic (congestive) heart failure: Secondary | ICD-10-CM

## 2014-12-31 DIAGNOSIS — I482 Chronic atrial fibrillation: Secondary | ICD-10-CM

## 2014-12-31 DIAGNOSIS — F419 Anxiety disorder, unspecified: Secondary | ICD-10-CM | POA: Diagnosis present

## 2014-12-31 DIAGNOSIS — M6289 Other specified disorders of muscle: Secondary | ICD-10-CM | POA: Diagnosis not present

## 2014-12-31 DIAGNOSIS — F39 Unspecified mood [affective] disorder: Secondary | ICD-10-CM | POA: Diagnosis present

## 2014-12-31 DIAGNOSIS — M4806 Spinal stenosis, lumbar region: Secondary | ICD-10-CM | POA: Diagnosis present

## 2014-12-31 DIAGNOSIS — I639 Cerebral infarction, unspecified: Secondary | ICD-10-CM | POA: Insufficient documentation

## 2014-12-31 DIAGNOSIS — I4891 Unspecified atrial fibrillation: Secondary | ICD-10-CM | POA: Diagnosis present

## 2014-12-31 DIAGNOSIS — Z882 Allergy status to sulfonamides status: Secondary | ICD-10-CM | POA: Diagnosis not present

## 2014-12-31 DIAGNOSIS — I63432 Cerebral infarction due to embolism of left posterior cerebral artery: Secondary | ICD-10-CM | POA: Diagnosis present

## 2014-12-31 DIAGNOSIS — Z880 Allergy status to penicillin: Secondary | ICD-10-CM | POA: Diagnosis not present

## 2014-12-31 DIAGNOSIS — R42 Dizziness and giddiness: Secondary | ICD-10-CM | POA: Diagnosis not present

## 2014-12-31 DIAGNOSIS — Z7982 Long term (current) use of aspirin: Secondary | ICD-10-CM | POA: Diagnosis not present

## 2014-12-31 DIAGNOSIS — R29898 Other symptoms and signs involving the musculoskeletal system: Secondary | ICD-10-CM | POA: Diagnosis present

## 2014-12-31 DIAGNOSIS — J438 Other emphysema: Secondary | ICD-10-CM | POA: Diagnosis not present

## 2014-12-31 DIAGNOSIS — E785 Hyperlipidemia, unspecified: Secondary | ICD-10-CM | POA: Diagnosis present

## 2014-12-31 DIAGNOSIS — Z9841 Cataract extraction status, right eye: Secondary | ICD-10-CM | POA: Diagnosis not present

## 2014-12-31 DIAGNOSIS — I480 Paroxysmal atrial fibrillation: Secondary | ICD-10-CM | POA: Diagnosis not present

## 2014-12-31 DIAGNOSIS — I481 Persistent atrial fibrillation: Secondary | ICD-10-CM | POA: Diagnosis not present

## 2014-12-31 DIAGNOSIS — I10 Essential (primary) hypertension: Secondary | ICD-10-CM

## 2014-12-31 DIAGNOSIS — G459 Transient cerebral ischemic attack, unspecified: Secondary | ICD-10-CM | POA: Diagnosis present

## 2014-12-31 DIAGNOSIS — D649 Anemia, unspecified: Secondary | ICD-10-CM | POA: Diagnosis present

## 2014-12-31 LAB — DIFFERENTIAL
Basophils Absolute: 0 10*3/uL (ref 0.0–0.1)
Basophils Relative: 0 % (ref 0–1)
EOS PCT: 2 % (ref 0–5)
Eosinophils Absolute: 0.2 10*3/uL (ref 0.0–0.7)
LYMPHS ABS: 1.2 10*3/uL (ref 0.7–4.0)
Lymphocytes Relative: 15 % (ref 12–46)
Monocytes Absolute: 0.7 10*3/uL (ref 0.1–1.0)
Monocytes Relative: 8 % (ref 3–12)
NEUTROS ABS: 6.4 10*3/uL (ref 1.7–7.7)
NEUTROS PCT: 75 % (ref 43–77)

## 2014-12-31 LAB — URINALYSIS, ROUTINE W REFLEX MICROSCOPIC
Bilirubin Urine: NEGATIVE
GLUCOSE, UA: NEGATIVE mg/dL
Hgb urine dipstick: NEGATIVE
KETONES UR: NEGATIVE mg/dL
LEUKOCYTES UA: NEGATIVE
Nitrite: NEGATIVE
Protein, ur: 30 mg/dL — AB
Specific Gravity, Urine: 1.017 (ref 1.005–1.030)
Urobilinogen, UA: 0.2 mg/dL (ref 0.0–1.0)
pH: 6 (ref 5.0–8.0)

## 2014-12-31 LAB — I-STAT CHEM 8, ED
BUN: 34 mg/dL — ABNORMAL HIGH (ref 6–20)
CHLORIDE: 103 mmol/L (ref 101–111)
Calcium, Ion: 1.22 mmol/L (ref 1.13–1.30)
Creatinine, Ser: 1.4 mg/dL — ABNORMAL HIGH (ref 0.61–1.24)
Glucose, Bld: 101 mg/dL — ABNORMAL HIGH (ref 65–99)
HCT: 37 % — ABNORMAL LOW (ref 39.0–52.0)
Hemoglobin: 12.6 g/dL — ABNORMAL LOW (ref 13.0–17.0)
POTASSIUM: 3.9 mmol/L (ref 3.5–5.1)
SODIUM: 139 mmol/L (ref 135–145)
TCO2: 24 mmol/L (ref 0–100)

## 2014-12-31 LAB — I-STAT TROPONIN, ED: Troponin i, poc: 0.02 ng/mL (ref 0.00–0.08)

## 2014-12-31 LAB — COMPREHENSIVE METABOLIC PANEL
ALBUMIN: 4.1 g/dL (ref 3.5–5.0)
ALT: 20 U/L (ref 17–63)
AST: 33 U/L (ref 15–41)
Alkaline Phosphatase: 44 U/L (ref 38–126)
Anion gap: 9 (ref 5–15)
BUN: 31 mg/dL — ABNORMAL HIGH (ref 6–20)
CHLORIDE: 102 mmol/L (ref 101–111)
CO2: 26 mmol/L (ref 22–32)
CREATININE: 1.32 mg/dL — AB (ref 0.61–1.24)
Calcium: 9.9 mg/dL (ref 8.9–10.3)
GFR, EST AFRICAN AMERICAN: 57 mL/min — AB (ref >60)
GFR, EST NON AFRICAN AMERICAN: 49 mL/min — AB (ref >60)
Glucose, Bld: 107 mg/dL — ABNORMAL HIGH (ref 65–99)
Potassium: 4.2 mmol/L (ref 3.5–5.1)
Sodium: 137 mmol/L (ref 135–145)
Total Bilirubin: 0.7 mg/dL (ref 0.3–1.2)
Total Protein: 6.9 g/dL (ref 6.5–8.1)

## 2014-12-31 LAB — LIPID PANEL
CHOLESTEROL: 135 mg/dL (ref 0–200)
HDL: 39 mg/dL — ABNORMAL LOW (ref >40)
LDL CALC: UNDETERMINED mg/dL (ref 0–99)
TRIGLYCERIDES: 801 mg/dL — AB (ref <150)
Total CHOL/HDL Ratio: 3.5 RATIO
VLDL: UNDETERMINED mg/dL (ref 0–40)

## 2014-12-31 LAB — CBC
HEMATOCRIT: 35.8 % — AB (ref 39.0–52.0)
HEMOGLOBIN: 11.5 g/dL — AB (ref 13.0–17.0)
MCH: 28.1 pg (ref 26.0–34.0)
MCHC: 32.1 g/dL (ref 30.0–36.0)
MCV: 87.5 fL (ref 78.0–100.0)
Platelets: 199 10*3/uL (ref 150–400)
RBC: 4.09 MIL/uL — ABNORMAL LOW (ref 4.22–5.81)
RDW: 14.9 % (ref 11.5–15.5)
WBC: 8.5 10*3/uL (ref 4.0–10.5)

## 2014-12-31 LAB — URINE MICROSCOPIC-ADD ON

## 2014-12-31 LAB — RAPID URINE DRUG SCREEN, HOSP PERFORMED
AMPHETAMINES: NOT DETECTED
Barbiturates: NOT DETECTED
Benzodiazepines: NOT DETECTED
Cocaine: NOT DETECTED
Opiates: NOT DETECTED
Tetrahydrocannabinol: NOT DETECTED

## 2014-12-31 LAB — ETHANOL

## 2014-12-31 LAB — APTT: APTT: 32 s (ref 24–37)

## 2014-12-31 LAB — PROTIME-INR
INR: 1.13 (ref 0.00–1.49)
PROTHROMBIN TIME: 14.7 s (ref 11.6–15.2)

## 2014-12-31 MED ORDER — TIOTROPIUM BROMIDE MONOHYDRATE 18 MCG IN CAPS
1.0000 | ORAL_CAPSULE | Freq: Every day | RESPIRATORY_TRACT | Status: DC
  Administered 2014-12-31: 18 ug via RESPIRATORY_TRACT
  Filled 2014-12-31: qty 5

## 2014-12-31 MED ORDER — ASPIRIN 300 MG RE SUPP: 300.0000 mg | Freq: Every day | RECTAL | Status: DC

## 2014-12-31 MED ORDER — STROKE: EARLY STAGES OF RECOVERY BOOK
Freq: Once | Status: AC
  Administered 2014-12-31: 03:00:00
  Filled 2014-12-31: qty 1

## 2014-12-31 MED ORDER — ESCITALOPRAM OXALATE 10 MG PO TABS
20.0000 mg | ORAL_TABLET | Freq: Every day | ORAL | Status: DC
  Filled 2014-12-31: qty 2

## 2014-12-31 MED ORDER — ACETAMINOPHEN 650 MG RE SUPP: 650.0000 mg | RECTAL | Status: DC | PRN

## 2014-12-31 MED ORDER — AZELASTINE HCL 0.1 % NA SOLN
2.0000 | Freq: Every day | NASAL | Status: DC
  Filled 2014-12-31: qty 30

## 2014-12-31 MED ORDER — HEPARIN SODIUM (PORCINE) 5000 UNIT/ML IJ SOLN
5000.0000 [IU] | Freq: Three times a day (TID) | INTRAMUSCULAR | Status: DC
Start: 2014-12-31 — End: 2014-12-31
  Administered 2014-12-31: 5000 [IU] via SUBCUTANEOUS
  Filled 2014-12-31: qty 1

## 2014-12-31 MED ORDER — FLUTICASONE FUROATE-VILANTEROL 100-25 MCG/INH IN AEPB: 2.0000 | INHALATION_SPRAY | Freq: Every day | RESPIRATORY_TRACT | Status: DC

## 2014-12-31 MED ORDER — ALBUTEROL SULFATE (2.5 MG/3ML) 0.083% IN NEBU: 2.5000 mg | INHALATION_SOLUTION | Freq: Four times a day (QID) | RESPIRATORY_TRACT | Status: DC | PRN

## 2014-12-31 MED ORDER — ARIPIPRAZOLE 10 MG PO TABS
5.0000 mg | ORAL_TABLET | Freq: Every day | ORAL | Status: DC
  Filled 2014-12-31: qty 1

## 2014-12-31 MED ORDER — APIXABAN 5 MG PO TABS: 5.0000 mg | ORAL_TABLET | Freq: Two times a day (BID) | ORAL | Status: DC

## 2014-12-31 MED ORDER — ATORVASTATIN CALCIUM 40 MG PO TABS
40.0000 mg | ORAL_TABLET | Freq: Every day | ORAL | Status: DC
  Administered 2014-12-31: 40 mg via ORAL
  Filled 2014-12-31: qty 1

## 2014-12-31 MED ORDER — ASPIRIN 325 MG PO TABS
325.0000 mg | ORAL_TABLET | Freq: Every day | ORAL | Status: DC
  Administered 2014-12-31: 325 mg via ORAL
  Filled 2014-12-31: qty 1

## 2014-12-31 MED ORDER — FENOFIBRATE 160 MG PO TABS
160.0000 mg | ORAL_TABLET | Freq: Every day | ORAL | Status: DC
  Filled 2014-12-31: qty 1

## 2014-12-31 MED ORDER — APIXABAN 5 MG PO TABS
5.0000 mg | ORAL_TABLET | Freq: Two times a day (BID) | ORAL | Status: DC
  Administered 2014-12-31: 5 mg via ORAL
  Filled 2014-12-31: qty 1

## 2014-12-31 MED ORDER — ATORVASTATIN CALCIUM 40 MG PO TABS: 40.0000 mg | ORAL_TABLET | Freq: Every day | ORAL | Status: DC

## 2014-12-31 MED ORDER — VERAPAMIL HCL ER 240 MG PO TBCR
240.0000 mg | EXTENDED_RELEASE_TABLET | Freq: Every day | ORAL | Status: DC
  Filled 2014-12-31: qty 1

## 2014-12-31 MED ORDER — ACETAMINOPHEN 325 MG PO TABS: 650.0000 mg | ORAL_TABLET | ORAL | Status: DC | PRN

## 2014-12-31 MED ORDER — ALBUTEROL SULFATE HFA 108 (90 BASE) MCG/ACT IN AERS: 2.0000 | INHALATION_SPRAY | Freq: Four times a day (QID) | RESPIRATORY_TRACT | Status: DC | PRN

## 2014-12-31 NOTE — ED Notes (Signed)
Pt in room from CT scan. RR RN, Neurologist and this RN in room

## 2014-12-31 NOTE — Discharge Instructions (Signed)

## 2014-12-31 NOTE — Evaluation (Addendum)
Physical Therapy Evaluation and D/C Patient Details Name: Eduardo [name] MRN: [id number] DOB: [birth year] Today's Date: 12/31/2014   History of Present Illness  This 81 y.o. male admitted with acute onset of Rt hand weakness.  MRI revealed:  small acute ischemic nonhemorrhagic infarct posterior Lt frontal lobe, and a tiny acute ischemic nonhemorrhagic infarct Lt occipital lobe.  PMH includes COPD, A-Fib, MI, HTN, CAD  Clinical Impression  Pt admitted with above diagnosis. Pt currently without significant  functional limitations to keep pt in hospital.  Pt able to ambulate with quad cane and go up and down steps with supervision and no physical assist.  DOE 3/4 with SOB which pt states is premorbid.  Wife and pt feelcomfortable with d/c home today and understand to use quad cane in home and ourdoors.  To f/u with Outpt PT as PTA.Marland Kitchen  Pt to d/c today.  Will sign off.    Follow Up Recommendations Outpatient PT;Supervision/Assistance - 24 hour    Equipment Recommendations  None recommended by PT    Recommendations for Other Services       Precautions / Restrictions Precautions Precautions: Fall Restrictions Weight Bearing Restrictions: No      Mobility  Bed Mobility Overal bed mobility: Modified Independent                Transfers Overall transfer level: Needs assistance Equipment used: Quad cane Transfers: Sit to/from Stand Sit to Stand: Supervision Stand pivot transfers: Supervision       General transfer comment: cues needed to use cane in left hand since right hand weak  Ambulation/Gait Ambulation/Gait assistance: Supervision Ambulation Distance (Feet): 250 Feet Assistive device: Quad cane Gait Pattern/deviations: Step-through pattern;Decreased stride length   Gait velocity interpretation: <1.8 ft/sec, indicative of risk for recurrent falls General Gait Details: Pt able to ambulate with quad cane with good safety awareness.  DOE 3/4 with sats to 89% toward end  of walk.  Pt has not had resp meds as hospital does not carry them therefore pt states SOB normal.  Returned to 90% or better in 3 min of rest at EOB.  Pt feels he is at baseline for his mobility.   Stairs Stairs: Yes Stairs assistance: Supervision Stair Management: One rail Left;One rail Right;Alternating pattern;Forwards Number of Stairs: 10 General stair comments: No difficulties on stairs.  Used rail.  Pt states he will leave a cane on each floor as he understands PT is recommending that he use cane at all times at home.    Wheelchair Mobility    Modified Rankin (Stroke Patients Only)       Balance Overall balance assessment: Needs assistance Sitting-balance support: No upper extremity supported;Feet supported Sitting balance-Leahy Scale: Good     Standing balance support: Single extremity supported;During functional activity Standing balance-Leahy Scale: Fair Standing balance comment: can stand statically without UE support for static stance.                              Pertinent Vitals/Pain Pain Assessment: No/denies pain  Sats 88-90% on RA after ambulating distance.  Pt and wife states this is pts baseline and he hasnt' had breathing meds. Nurse aware.     Home Living Family/patient expects to be discharged to:: Private residence Living Arrangements: Spouse/significant other Available Help at Discharge: Family;Available 24 hours/day Type of Home: House Home Access: Stairs to enter Entrance Stairs-Rails: Right;Left;Can reach both Entrance Stairs-Number of Steps: 3 Home Layout: Two level;Able  to live on main level with bedroom/bathroom Home Equipment: Kasandra Knudsen - quad;Cane - single point;Shower seat - built in;Grab bars - tub/shower      Prior Function Level of Independence: Independent;Independent with assistive device(s)         Comments: pt ambulates with cane outdoors.  Drives.  H/o falls      Hand Dominance   Dominant Hand: Right     Extremity/Trunk Assessment   Upper Extremity Assessment: Defer to OT evaluation RUE Deficits / Details: strength shoulder to wrist 5/5.  Pt with full Active flexion and extension.  Able to opose to digit 4, not 5.            Lower Extremity Assessment: Generalized weakness      Cervical / Trunk Assessment: Normal  Communication   Communication: No difficulties  Cognition Arousal/Alertness: Awake/alert Behavior During Therapy: WFL for tasks assessed/performed Overall Cognitive Status: Within Functional Limits for tasks assessed       Memory: Decreased recall of precautions              General Comments General comments (skin integrity, edema, etc.): Reviewed signs/symptoms of stroke and BEFAST.  02 sats 92% on RA; BP 144/77    Exercises Other Exercises Other Exercises: Pt instructed in breathing exercises and given handout for home.        Assessment/Plan    PT Assessment All further PT needs can be met in the next venue of care  PT Diagnosis Generalized weakness   PT Problem List Decreased balance;Decreased mobility;Decreased activity tolerance;Decreased knowledge of use of DME;Decreased safety awareness;Decreased knowledge of precautions  PT Treatment Interventions     PT Goals (Current goals can be found in the Care Plan section) Acute Rehab PT Goals PT Goal Formulation: All assessment and education complete, DC therapy    Frequency     Barriers to discharge        Co-evaluation               End of Session Equipment Utilized During Treatment: Gait belt Activity Tolerance: Patient limited by fatigue Patient left: in bed;with call bell/phone within reach;with family/visitor present Nurse Communication: Mobility status         Time: 4599-7741 PT Time Calculation (min) (ACUTE ONLY): 15 min   Charges:   PT Evaluation $Initial PT Evaluation Tier I: 1 Procedure     PT G CodesDenice Paradise 01/21/15, 3:46 PM [facility] Acute Rehabilitation [phone] (pager)

## 2014-12-31 NOTE — ED Provider Notes (Addendum)
CSN: [id number]     Arrival date & time 12/30/14  2335 History  This chart was scribed for Everlene Balls, MD by Evelene Croon, ED Scribe. This patient was seen in room B16C/B16C and the patient's care was started 12:01 AM.   Chief Complaint  Patient presents with  . Extremity Weakness    The history is provided by the patient and the spouse.     HPI Comments:  Eduardo [name] is a 81 y.o. male with a history of AFIB who presents to the Emergency Department complaining of sudden onset constant weakness to his right hand which started ~2230 last night while seated at home. Wife denies facial droop and lower extremity weakness. No alleviating factors noted.   Past Medical History  Diagnosis Date  . Anemia   . Anxiety   . Atrial fibrillation   . CAD (coronary artery disease)   . Hyperlipidemia   . COPD (chronic obstructive pulmonary disease)   . Allergy   . Osteoarthritis   . Hx of colonic polyps   . Hyperplastic polyp of intestine 2001  . Adenomatous polyp 2001  . Hypertension 12/25/11    pt denies this history  . Myocardial infarction 06/2010    "twice; 1 day apart; during/after cath"  . Exertional dyspnea   . S/P appy   . Arrhythmia    Past Surgical History  Procedure Laterality Date  . Appendectomy    . Carpal tunnel release      left  . Mastoidectomy      x3 on right  . Pilonidal cyst / sinus excision    . Knee arthroscopy      left, right knee 10/2009  . Shoulder arthroscopy      right  . Foot fracture surgery      right heel repair  . Nasal septum surgery      nasal septal repair  . Penile prosthesis implant      x 2--got infection after 2nd and had to remove  . Cataract extraction w/ intraocular lens implant  2/13    Dr [name]  . Carpal tunnel release  10/11/11    Dr Rush Barer  . Tonsillectomy and adenoidectomy    . Fracture surgery    . Cardiac catheterization  2012    50% LAD and luminal irreg in RCA, 1/12  Post cath MI from damage  .  Penile prosthesis  removal     Family History  Problem Relation Age of Onset  . Diabetes Neg Hx   . Hypertension Neg Hx   . Heart attack Father   . Cancer Mother     ? leukemia   History  Substance Use Topics  . Smoking status: Former Smoker -- 2.50 packs/day for 30 years    Types: Cigarettes    Quit date: 06/11/1978  . Smokeless tobacco: Never Used  . Alcohol Use: Yes     Comment: QUIT [id number] alcoholic    Review of Systems  A complete 10 system review of systems was obtained and all systems are negative except as noted in the HPI and PMH.    Allergies  Penicillins and Sulfonamide derivatives  Home Medications   Prior to Admission medications   Medication Sig Start Date End Date Taking? Authorizing Provider  albuterol (PROAIR HFA) 108 (90 BASE) MCG/ACT inhaler Inhale 2 puffs into the lungs every 6 (six) hours as needed for wheezing or shortness of breath. 12/08/14   Collene Gobble, MD  ARIPiprazole (ABILIFY) 10 MG  tablet Take 10 mg by mouth daily.  05/26/14   Historical Provider, MD  aspirin 81 MG tablet Take 81 mg by mouth daily.      Historical Provider, MD  atorvastatin (LIPITOR) 10 MG tablet TAKE 1 TABLET BY MOUTH DAILY 06/17/14   Belva Crome, MD  Azelastine HCl (ASTEPRO) 0.15 % SOLN USE 2 SPRAYS IN EACH NOSTRIL TWICE A DAY 08/11/12   Collene Gobble, MD  cetirizine (ZYRTEC) 10 MG tablet Take 10 mg by mouth daily.    Historical Provider, MD  co-enzyme Q-10 30 MG capsule Take 30 mg by mouth daily.    Historical Provider, MD  escitalopram (LEXAPRO) 20 MG tablet Take 20 mg by mouth daily.     Historical Provider, MD  fenofibrate 160 MG tablet Take 1 tablet (160 mg total) by mouth daily. 09/21/14   Belva Crome, MD  Fluticasone Furoate-Vilanterol (BREO ELLIPTA) 100-25 MCG/INH AEPB Inhale 1 puff into the lungs daily. 09/02/14   Collene Gobble, MD  Multiple Vitamin (MULTIVITAMIN) tablet Take 1 tablet by mouth daily.    Historical Provider, MD  tiotropium (SPIRIVA) 18 MCG inhalation  capsule Place 18 mcg into inhaler and inhale daily.    Historical Provider, MD  Tiotropium Bromide Monohydrate (SPIRIVA RESPIMAT) 2.5 MCG/ACT AERS Inhale 2 puffs into the lungs daily. 11/01/14   Collene Gobble, MD  verapamil (CALAN-SR) 240 MG CR tablet Take 1 tablet (240 mg total) by mouth at bedtime. 08/26/14   Belva Crome, MD   BP 154/67 mmHg  Pulse 90  Temp(Src) 98.7 F (37.1 C) (Oral)  Resp 23  Ht 5\' 3"  (1.6 m)  Wt 163 lb (73.936 kg)  BMI 28.88 kg/m2  SpO2 98% Physical Exam  Constitutional: He is oriented to person, place, and time. Vital signs are normal. He appears well-developed and well-nourished.  Non-toxic appearance. He does not appear ill. No distress.  HENT:  Head: Normocephalic and atraumatic.  Nose: Nose normal.  Mouth/Throat: Oropharynx is clear and moist. No oropharyngeal exudate.  Eyes: Conjunctivae and EOM are normal. Pupils are equal, round, and reactive to light. No scleral icterus.  Neck: Normal range of motion. Neck supple. No tracheal deviation, no edema, no erythema and normal range of motion present. No thyroid mass and no thyromegaly present.  Cardiovascular: Normal rate, regular rhythm, S1 normal, S2 normal, normal heart sounds, intact distal pulses and normal pulses.  Exam reveals no gallop and no friction rub.   No murmur heard. Pulses:      Radial pulses are 2+ on the right side, and 2+ on the left side.       Dorsalis pedis pulses are 2+ on the right side, and 2+ on the left side.  Pulmonary/Chest: Effort normal and breath sounds normal. No respiratory distress. He has no wheezes. He has no rhonchi. He has no rales.  Abdominal: Soft. Normal appearance and bowel sounds are normal. He exhibits no distension, no ascites and no mass. There is no hepatosplenomegaly. There is no tenderness. There is no rebound, no guarding and no CVA tenderness.  Musculoskeletal: Normal range of motion. He exhibits no edema or tenderness.  Lymphadenopathy:    He has no cervical  adenopathy.  Neurological: He is alert and oriented to person, place, and time. He has normal strength. No cranial nerve deficit or sensory deficit. He exhibits abnormal muscle tone.  Functional deficits on the R hand.  No other abnormalities found.  Skin: Skin is warm, dry and intact. No  petechiae and no rash noted. He is not diaphoretic. No erythema. No pallor.  Psychiatric: He has a normal mood and affect. His behavior is normal. Judgment normal.  Nursing note and vitals reviewed.   ED Course  Procedures   DIAGNOSTIC STUDIES:  Oxygen Saturation is 98% on RA, normal by my interpretation.    COORDINATION OF CARE:  12:43 AM Discussed treatment plan with pt at bedside and pt agreed to plan.  Labs Review Labs Reviewed  CBC - Abnormal; Notable for the following:    RBC 4.09 (*)    Hemoglobin 11.5 (*)    HCT 35.8 (*)    All other components within normal limits  COMPREHENSIVE METABOLIC PANEL - Abnormal; Notable for the following:    Glucose, Bld 107 (*)    BUN 31 (*)    Creatinine, Ser 1.32 (*)    GFR calc non Af Amer 49 (*)    GFR calc Af Amer 57 (*)    All other components within normal limits  URINALYSIS, ROUTINE W REFLEX MICROSCOPIC (NOT AT [facility]) - Abnormal; Notable for the following:    Protein, ur 30 (*)    All other components within normal limits  I-STAT CHEM 8, ED - Abnormal; Notable for the following:    BUN 34 (*)    Creatinine, Ser 1.40 (*)    Glucose, Bld 101 (*)    Hemoglobin 12.6 (*)    HCT 37.0 (*)    All other components within normal limits  ETHANOL  PROTIME-INR  APTT  DIFFERENTIAL  URINE RAPID DRUG SCREEN, HOSP PERFORMED  URINE MICROSCOPIC-ADD ON  HEMOGLOBIN A1C  LIPID PANEL  I-STAT TROPOININ, ED    Imaging Review Dg Chest 2 View  12/31/2014   CLINICAL DATA:  81 year old male with right-sided weakness, shortness of breath and wheezing  EXAM: CHEST  2 VIEW  COMPARISON:  Radiograph dated 12/1911  FINDINGS: Two views of the chest demonstrate  emphysematous changes of the lungs. There is an area of increased opacity in the lingula similar to prior study likely atelectasis/ scarring. Pneumonia is not excluded. Clinical correlation is recommended. Stable cardiomegaly. There is no pleural effusion or pneumothorax. Osteopenia with degenerative changes of the spine.  IMPRESSION: Lingular atelectasis/scarring. Pneumonia is less likely. Clinical correlation is recommended.   Electronically Signed   By: Anner Crete M.D.   On: 12/31/2014 01:59   Ct Head Wo Contrast  12/31/2014   CLINICAL DATA:  81 year old male with right hand weakness. Code stroke.  EXAM: CT HEAD WITHOUT CONTRAST  TECHNIQUE: Contiguous axial images were obtained from the base of the skull through the vertex without intravenous contrast.  COMPARISON:  None.  FINDINGS: The ventricles are dilated and the sulci are prominent compatible with age-related atrophy. A septum pellucidum and cavum vergae noted. Periventricular and deep white matter hypodensities represent chronic microvascular ischemic changes. There is no intracranial hemorrhage. No mass effect or midline shift identified.  There is mild mucoperiosteal thickening of paranasal sinuses. Partial right mastoid surgery noted. The visualized mastoid air cells are well aerated. The calvarium is intact.  IMPRESSION: No acute intracranial pathology.  Age-related atrophy and chronic microvascular ischemic disease.  If symptoms persist and there are no contraindications, MRI may provide better evaluation if clinically indicated.  Critical Value/emergent results were called by telephone at the time of interpretation on 12/31/2014 at 12:17 am to Dr. Stark Jock, who verbally acknowledged these results.   Electronically Signed   By: Anner Crete M.D.   On: 12/31/2014  00:19     EKG Interpretation   Date/Time:  Friday December 31 2014 00:18:48 EDT Ventricular Rate:  92 PR Interval:    QRS Duration: 113 QT Interval:  376 QTC Calculation: 465 R  Axis:   85 Text Interpretation:  Atrial fibrillation Borderline intraventricular  conduction delay Borderline ST depression, diffuse leads Abnormal T,  consider ischemia, inferior leads Confirmed by Glynn Octave  [phone]) on 12/31/2014 1:00:06 AM      MDM   Final diagnoses:  Weakness   Patient presents emergency department for sudden onset right hand weakness beginning at 10 PM. He is still in the window, code stroke was called. Dr. Janann Colonel with neurology evaluated the patient recommends for inpatient workup. Patient and family do not want TPA for treatment. I believe this is the proper choice as well. He continues to have his neurological deficits. We'll page triad hospitalist for admission, I spoke with Dr. Posey Pronto who accepts the patient.  CRITICAL CARE Performed by: Everlene Balls   Total critical care time: 14min - tpa considered for ischemic stroke, family refused it  Critical care time was exclusive of separately billable procedures and treating other patients.  Critical care was necessary to treat or prevent imminent or life-threatening deterioration.  Critical care was time spent personally by me on the following activities: development of treatment plan with patient and/or surrogate as well as nursing, discussions with consultants, evaluation of patient's response to treatment, examination of patient, obtaining history from patient or surrogate, ordering and performing treatments and interventions, ordering and review of laboratory studies, ordering and review of radiographic studies, pulse oximetry and re-evaluation of patient's condition.   I personally performed the services described in this documentation, which was scribed in my presence. The recorded information has been reviewed and is accurate.   Everlene Balls, MD 12/31/14 [id number]  Everlene Balls, MD 12/31/14 [phone]

## 2014-12-31 NOTE — Progress Notes (Signed)
Pt arrived to 4N28 from ED. Pt is alert and oriented and in no distress.  Family at bedside. Safety measures in place.  Will continue to monitor.   Fredrich Romans, RN

## 2014-12-31 NOTE — ED Notes (Signed)
Admitting at bedside 

## 2014-12-31 NOTE — Progress Notes (Signed)
STROKE TEAM PROGRESS NOTE   HISTORY Eduardo [name] is a right handed 81 y.o. male with hx of A fib (not on anticoagulation), MI, HTN, HLD presenting with acute onset of right hand weakness. LSW 2230 when he noted difficulty using his right hand. Weakness is from the wrist hand, noted difficulty with grip strength. No proximal weakness, no sensory deficits. No LE weakness. No speech or visual changes. Feels symptoms are slightly improving though strength is not back to normal. No prior CVA history. He denies any abnormal positioning of his arm prior to symptom onset, no trauma or neck pain.   CT head imaging reviewed and overall unremarkable. Initial NIHSS of 0   Date last known well: 12/31/2014 Time last known well: 2230 tPA Given: no, too mild to treat Modified Rankin: Rankin Score=0   SUBJECTIVE (INTERVAL HISTORY) Multiple family members present. The patient's wife reports the patient had been on Coumadin in the past; however, this was discontinued over concerns of falling and bleeding requiring blood transfusion 2 years ago. The patient reports that his right hand is much improved. Wife is asking to go home.   OBJECTIVE Temp:  [97.9 F (36.6 C)-99.2 F (37.3 C)] 98.2 F (36.8 C) (07/22 0600) Pulse Rate:  [60-90] 60 (07/22 0600) Cardiac Rhythm:  [-]  Resp:  [16-26] 18 (07/22 0600) BP: (113-167)/(49-87) 113/49 mmHg (07/22 0600) SpO2:  [95 %-99 %] 98 % (07/22 0600) Weight:  [72.122 kg (159 lb)-73.936 kg (163 lb)] 73.1 kg (161 lb 2.5 oz) (07/22 0241)  No results for input(s): GLUCAP in the last 168 hours.  Recent Labs Lab 12/31/14 0012 12/31/14 0016  NA 139 137  K 3.9 4.2  CL 103 102  CO2  --  26  GLUCOSE 101* 107*  BUN 34* 31*  CREATININE 1.40* 1.32*  CALCIUM  --  9.9    Recent Labs Lab 12/31/14 0016  AST 33  ALT 20  ALKPHOS 44  BILITOT 0.7  PROT 6.9  ALBUMIN 4.1    Recent Labs Lab 12/31/14 0012 12/31/14 0016  WBC  --  8.5  NEUTROABS  --  6.4   HGB 12.6* 11.5*  HCT 37.0* 35.8*  MCV  --  87.5  PLT  --  199   No results for input(s): CKTOTAL, CKMB, CKMBINDEX, TROPONINI in the last 168 hours.  Recent Labs  12/31/14 0016  LABPROT 14.7  INR 1.13    Recent Labs  12/31/14 0037  COLORURINE YELLOW  LABSPEC 1.017  PHURINE 6.0  GLUCOSEU NEGATIVE  HGBUR NEGATIVE  BILIRUBINUR NEGATIVE  KETONESUR NEGATIVE  PROTEINUR 30*  UROBILINOGEN 0.2  NITRITE NEGATIVE  LEUKOCYTESUR NEGATIVE       Component Value Date/Time   CHOL 135 12/31/2014 0510   TRIG 801* 12/31/2014 0510   HDL 39* 12/31/2014 0510   CHOLHDL 3.5 12/31/2014 0510   VLDL UNABLE TO CALCULATE IF TRIGLYCERIDE OVER 400 mg/dL 12/31/2014 0510   LDLCALC UNABLE TO CALCULATE IF TRIGLYCERIDE OVER 400 mg/dL 12/31/2014 0510   Lab Results  Component Value Date   HGBA1C 5.8* 12/25/2011      Component Value Date/Time   LABOPIA NONE DETECTED 12/31/2014 0037   COCAINSCRNUR NONE DETECTED 12/31/2014 0037   LABBENZ NONE DETECTED 12/31/2014 0037   AMPHETMU NONE DETECTED 12/31/2014 0037   THCU NONE DETECTED 12/31/2014 0037   LABBARB NONE DETECTED 12/31/2014 0037     Recent Labs Lab 12/31/14 0016  ETH <5   2-D echocardiogram 12/31/2014 Study Conclusions - Left ventricle: The cavity  size was normal. There was severe concentric hypertrophy. Systolic function was normal. The estimated ejection fraction was in the range of 60% to 65%. Wall motion was normal; there were no regional wall motion abnormalities. The study was not technically sufficient to allow evaluation of LV diastolic dysfunction due to atrial fibrillation. - Aortic valve: Trileaflet; moderately thickened, severely calcified leaflets with fixed left coronary cusp. Valve mobility was restricted. There was mild stenosis. There was no regurgitation. Mean gradient (S): 9 mm Hg. Peak gradient (S): 19 mm Hg. Valve area (VTI): 2.07 cm^2. Valve area (Vmax): 1.58 cm^2. Valve area (Vmean):  1.75 cm^2. - Aortic root: The aortic root was normal in size. - Ascending aorta: The ascending aorta was normal in size. - Mitral valve: Calcified annulus. Moderately thickened, moderately calcified leaflets . Mobility was restricted. The findings are consistent with mild stenosis. There was mild regurgitation. Valve area by continuity equation (using LVOT flow): 2.59 cm^2. - Left atrium: The atrium was severely dilated. - Right ventricle: Systolic function was normal. - Right atrium: The atrium was mildly dilated. - Tricuspid valve: There was mild regurgitation. - Pulmonary arteries: Systolic pressure was within the normal range. PA peak pressure: 34 mm Hg (S). - Pericardium, extracardiac: There was no pericardial effusion.  CUS - Bilateral: 1-39% ICA stenosis. Vertebral artery flow is antegrade.  Imaging  Dg Chest 2 View 12/31/2014    Lingular atelectasis/scarring. Pneumonia is less likely. Clinical correlation is recommended.     Ct Head Wo Contrast 12/31/2014    No acute intracranial pathology.  Age-related atrophy and chronic microvascular ischemic disease.  If symptoms persist and there are no contraindications, MRI may provide better evaluation if clinically indicated.    Mri and Mra Head Wo Contrast 12/31/2014     MRI HEAD  1. Small acute ischemic nonhemorrhagic 10 x 9 x 12 mm cortical infarct involving the posterior left frontal lobe in the left precentral gyrus.  2. Additional tiny acute ischemic nonhemorrhagic 5 mm cortical infarct within the left occipital lobe.  3. Advanced age-related cerebral atrophy with mild chronic microvascular ischemic disease.    MRA HEAD    1. No proximal or large vessel occlusion identified within the intracranial circulation.  2. No correctable or focal hemosiderin and likely significant stenosis identified.  3. Diminutive left A2 segment with moderate to advanced multi focal atheromatous irregularity.  4. Distal branch atheromatous  irregularity within the MCA branches bilaterally.      PHYSICAL EXAM  Temp:  [97.9 F (36.6 C)-99.2 F (37.3 C)] 97.9 F (36.6 C) (07/22 1440) Pulse Rate:  [45-109] 71 (07/22 1440) Resp:  [16-26] 18 (07/22 1440) BP: (113-167)/(38-87) 148/55 mmHg (07/22 1440) SpO2:  [93 %-99 %] 93 % (07/22 1440) Weight:  [159 lb (72.122 kg)-161 lb 2.5 oz (73.1 kg)] 161 lb 2.5 oz (73.1 kg) (07/22 0241)  General - Well nourished, well developed, in no apparent distress.  Ophthalmologic - Fundi not visualized due to eye movement.  Cardiovascular - Regular rate and rhythm, not in afib rhythm.  Mental Status -  Level of arousal and orientation to time, place, and person were intact. Language including expression, naming, repetition, comprehension was assessed and found intact. Fund of Knowledge was assessed and was intact.  Cranial Nerves II - XII - II - Visual field intact OU. III, IV, VI - Extraocular movements intact V - Facial sensation intact bilaterally. VII - Facial movement intact bilaterally. VIII - Hearing & vestibular intact bilaterally. X - Palate elevates symmetrically. XI -  Chin turning & shoulder shrug intact bilaterally. XII - Tongue protrusion intact.  Motor Strength - The patient's strength was normal in all extremities except right wrist 4+/5 flexion and extension, difficulty with dexterity and pronator drift was absent.  Bulk was normal and fasciculations were absent.   Motor Tone - Muscle tone was assessed at the neck and appendages and was normal.  Reflexes - The patient's reflexes were 1+ in all extremities and he had no pathological reflexes.  Sensory - Light touch, temperature/pinprick were assessed and were symmetrical.    Coordination - The patient had normal movements in the hands and feet with no ataxia or dysmetria.  Tremor was absent.  Gait and Station - deferred due to CUS testing in room.  ASSESSMENT/PLAN Mr. TENNISON SLOMKA is a 81 y.o. male with history  of atrial fibrillation not on anticoagulation, previous MI, hypertension,COPD, and hyperlipidemia presenting with right hand weakness. He did not receive IV t-PA due to mild deficits.   Stroke:  Dominant left frontal and left occipital small infarct embolic secondary to atrial fibrillation.  Resultant  - improvement in deficits.  MRI - as above  MRA  unremarkable  Carotid Doppler unremarkable  2D Echo - EF 60-65%   LDL unable to calculate secondary to triglycerides greater than 801  HgbA1c pending  Eliquis for VTE prophylaxis  Diet Heart Room service appropriate?: Yes; Fluid consistency:: Thin  aspirin 81 mg orally every day prior to admission, now on eliquis (apixaban) for stroke prevention  Patient counseled to be compliant with his antithrombotic medications  Ongoing aggressive stroke risk factor management  Therapy recommendations:  Outpatient physical therapy recommended  Disposition: pending  afib  Pt was on coumadin but d/c due to a fall with extensive bleeding  However, due to cortical stroke, we would recommend eliquis for stroke prevention  Hypertension  Home meds: verapamil  Blood pressure mildly low at times   Hyperlipidemia  Home meds:  Lipitor10 mg daily and fenofibrate resumed in hospital  LDL unable to calculate, goal < 70  Lipitor increased to 20 mg daily  Continue statin at discharge  Follow up with PCP to consider PCSK9 initiators for high TG   Other Stroke Risk Factors  Advanced age  Cigarette smoker, quit smoking 36 years ago.  ETOH use  Coronary artery disease  Atrial fibrillation - now on Eliquis  Other Active Problems  Renal insufficiency  Other Pertinent History    Hospital day # 0  Neurology will sign off. Please call with questions. Pt will follow up with Dr. Erlinda Hong at [facility] in about 2 months. Thanks for the consult.  Rosalin Hawking, MD PhD Stroke Neurology 01/01/2015 12:19 AM    To contact Stroke Continuity  provider, please refer to [email or web address] After hours, contact General Neurology

## 2014-12-31 NOTE — Consult Note (Addendum)
Stroke Consult    Chief Complaint: right hand weakness  HPI: Eduardo [name] is a right handed 81 y.o. male with hx of A fib (not on anticoagulation), MI, HTN, HLD presenting with acute onset of right hand weakness. LSW 2230 when he noted difficulty using his right hand. Weakness is from the wrist hand, noted difficulty with grip strength. No proximal weakness, no sensory deficits. No LE weakness. No speech or visual changes. Feels symptoms are slightly improving though strength is not back to normal. No prior CVA history. He denies any abnormal positioning of his arm prior to symptom onset, no trauma or neck pain.   CT head imaging reviewed and overall unremarkable. Initial NIHSS of 0   Date last known well: 12/31/2014  Time last known well: 2230 tPA Given: no, too mild to treat Modified Rankin: Rankin Score=0  Past Medical History  Diagnosis Date  . Anemia   . Anxiety   . Atrial fibrillation   . CAD (coronary artery disease)   . Hyperlipidemia   . COPD (chronic obstructive pulmonary disease)   . Allergy   . Osteoarthritis   . Hx of colonic polyps   . Hyperplastic polyp of intestine 2001  . Adenomatous polyp 2001  . Hypertension 12/25/11    pt denies this history  . Myocardial infarction 06/2010    "twice; 1 day apart; during/after cath"  . Exertional dyspnea   . S/P appy   . Arrhythmia     Past Surgical History  Procedure Laterality Date  . Appendectomy    . Carpal tunnel release      left  . Mastoidectomy      x3 on right  . Pilonidal cyst / sinus excision    . Knee arthroscopy      left, right knee 10/2009  . Shoulder arthroscopy      right  . Foot fracture surgery      right heel repair  . Nasal septum surgery      nasal septal repair  . Penile prosthesis implant      x 2--got infection after 2nd and had to remove  . Cataract extraction w/ intraocular lens implant  2/13    Dr [name]  . Carpal tunnel release  10/11/11    Dr Rush Barer  .  Tonsillectomy and adenoidectomy    . Fracture surgery    . Cardiac catheterization  2012    50% LAD and luminal irreg in RCA, 1/12  Post cath MI from damage  . Penile prosthesis  removal      Family History  Problem Relation Age of Onset  . Diabetes Neg Hx   . Hypertension Neg Hx   . Heart attack Father   . Cancer Mother     ? leukemia   Social History:  reports that he quit smoking about 36 years ago. His smoking use included Cigarettes. He has a 75 pack-year smoking history. He has never used smokeless tobacco. He reports that he drinks alcohol. He reports that he does not use illicit drugs.  Allergies:  Allergies  Allergen Reactions  . Penicillins Itching  . Sulfonamide Derivatives Other (See Comments)    "it affected my kidneys"     (Not in a hospital admission)  ROS: Out of a complete 14 system review, the patient complains of only the following symptoms, and all other reviewed systems are negative. + hand weakness  Physical Examination: Filed Vitals:   12/31/14 0027  BP: 167/87  Pulse: 89  Temp:   Resp: 26   Physical Exam  Constitutional: He appears well-developed and well-nourished.  Psych: Affect appropriate to situation Eyes: No scleral injection HENT: No OP obstrucion Head: Normocephalic, atraumatic Cardio: irregular, no murmurs.  Respiratory: Effort normal and breath sounds normal.  GI: Soft. Bowel sounds are normal. No distension. There is no tenderness.  Skin: WDI   Neurologic Examination: Mental Status: Alert, oriented, thought content appropriate.  Speech fluent without evidence of aphasia.  Able to follow 3 step commands without difficulty. Cranial Nerves: II: funduscopic exam wnl bilaterally, visual fields grossly normal, pupils equal, round, reactive to light and accommodation III,IV, VI: ptosis not present, extra-ocular motions intact bilaterally V,VII: smile symmetric, facial light touch sensation normal bilaterally VIII: hearing  normal bilaterally IX,X: gag reflex present XI: trapezius strength/neck flexion strength normal bilaterally XII: tongue strength normal  Motor: Right : Upper extremity    Left:     Upper extremity 5/5 deltoid       5/5 deltoid 5/5 biceps      5/5 biceps  5/5 triceps      5/5 triceps 4+/5wrist flexion     5/5 wrist flexion 4+/5 wrist extension     5/5 wrist extension 4+/5 hand grip      5/5 hand grip  Lower extremity     Lower extremity 5/5 hip flexor      5/5 hip flexor 5/5 quadricep      5/5 quadriceps  5/5 hamstrings     5/5 hamstrings 5/5 plantar flexion       5/5 plantar flexion 5/5 plantar extension     5/5 plantar extension *Unable to write due to difficulty holding a pen Tone and bulk:normal tone throughout; no atrophy noted Sensory: Pinprick and light touch intact throughout, bilaterally Deep Tendon Reflexes: 2+ and symmetric throughout Plantars: Right: downgoing   Left: downgoing Cerebellar: normal finger-to-nose, and normal heel-to-shin test Gait: deferred  Laboratory Studies:   Basic Metabolic Panel:  Recent Labs Lab 12/31/14 0012  NA 139  K 3.9  CL 103  GLUCOSE 101*  BUN 34*  CREATININE 1.40*    Liver Function Tests: No results for input(s): AST, ALT, ALKPHOS, BILITOT, PROT, ALBUMIN in the last 168 hours. No results for input(s): LIPASE, AMYLASE in the last 168 hours. No results for input(s): AMMONIA in the last 168 hours.  CBC:  Recent Labs Lab 12/31/14 0012  HGB 12.6*  HCT 37.0*    Cardiac Enzymes: No results for input(s): CKTOTAL, CKMB, CKMBINDEX, TROPONINI in the last 168 hours.  BNP: Invalid input(s): POCBNP  CBG: No results for input(s): GLUCAP in the last 168 hours.  Microbiology: Results for orders placed or performed during the hospital encounter of 06/15/10  MR Staph DNA probe, amplified     Status: None   Collection Time: 06/15/10  4:25 PM  Result Value Ref Range Status   MRSA by PCR  NEGATIVE Final    NEGATIVE        The  GeneXpert MRSA Assay (FDA approved for NASAL specimens only), is one component of a comprehensive MRSA colonization surveillance program. It is not intended to diagnose MRSA infection nor to guide or monitor treatment for MRSA infections.    Coagulation Studies: No results for input(s): LABPROT, INR in the last 72 hours.  Urinalysis: No results for input(s): COLORURINE, LABSPEC, PHURINE, GLUCOSEU, HGBUR, BILIRUBINUR, KETONESUR, PROTEINUR, UROBILINOGEN, NITRITE, LEUKOCYTESUR in the last 168 hours.  Invalid input(s): APPERANCEUR  Lipid Panel:     Component Value Date/Time  CHOL 186 04/19/2014 1700   TRIG 873.0* 04/19/2014 1700   HDL 38.10* 04/19/2014 1700   CHOLHDL 5 04/19/2014 1700   VLDL 174.6* 04/19/2014 1700    HgbA1C:  Lab Results  Component Value Date   HGBA1C 5.8* 12/25/2011    Urine Drug Screen:  No results found for: LABOPIA, COCAINSCRNUR, LABBENZ, AMPHETMU, THCU, LABBARB  Alcohol Level: No results for input(s): ETH in the last 168 hours.  Other results: EKG: atrial fibrillation, .  Imaging: Ct Head Wo Contrast  12/31/2014   CLINICAL DATA:  81 year old male with right hand weakness. Code stroke.  EXAM: CT HEAD WITHOUT CONTRAST  TECHNIQUE: Contiguous axial images were obtained from the base of the skull through the vertex without intravenous contrast.  COMPARISON:  None.  FINDINGS: The ventricles are dilated and the sulci are prominent compatible with age-related atrophy. A septum pellucidum and cavum vergae noted. Periventricular and deep white matter hypodensities represent chronic microvascular ischemic changes. There is no intracranial hemorrhage. No mass effect or midline shift identified.  There is mild mucoperiosteal thickening of paranasal sinuses. Partial right mastoid surgery noted. The visualized mastoid air cells are well aerated. The calvarium is intact.  IMPRESSION: No acute intracranial pathology.  Age-related atrophy and chronic microvascular ischemic  disease.  If symptoms persist and there are no contraindications, MRI may provide better evaluation if clinically indicated.  Critical Value/emergent results were called by telephone at the time of interpretation on 12/31/2014 at 12:17 am to Dr. Stark Jock, who verbally acknowledged these results.   Electronically Signed   By: Anner Crete M.D.   On: 12/31/2014 00:19    Assessment: 81 y.o. male hx of HTN, HLD, A fib (not on anticoagulation) presenting with acute onset of right hand weakness. LSW 2230. Exam shows distal RUE weakness. CT head imaging reviewed, overall unremarkable. Differential includes small CVA vs possible peripheral neuropathy.   Code stroke activated. Initial NIHSS of 0 though with noted right hand weakness in a right handed person. Had extensive discussion of risk benefits of tPA with patient and his wife. Discussed ICH risk with tPA vs functional deficit or progression of symptoms if no treatment. They expressed understanding and decided to hold on tPA at this time. He will remain in the tPA window until 0300.   Plan: 1. HgbA1c, fasting lipid panel 2. MRI, MRA  of the brain without contrast 3. PT consult, OT consult, Speech consult 4. Echocardiogram 5. Carotid dopplers 6. Prophylactic therapy-ASA 325mg  daily. May need to consider anticoagulation pending MRI results 7. Risk factor modification 8. Telemetry monitoring 9. Frequent neuro checks 10. NPO until RN stroke swallow screen      Jim Like, DO [facility] [phone]  If 7pm- 7am, please page neurology on call as listed in [place]. 12/31/2014, 12:31 AM

## 2014-12-31 NOTE — Progress Notes (Signed)
Copay for Eliquis 5mg  BID/30 day supply will be $40.00  No prior authorization needed.  Pt notified of copay amt.  Lionel December, RN, BSN [phone]

## 2014-12-31 NOTE — Code Documentation (Signed)
Pt LSW at 2230 hrs on 7/21/16while drinking a glass of water in the kitchen with his wife.  He suddenly became unable to make a fist with his right hand  and pick up his glass.  No HA, blurred vision, nausea, no speech difficulties ,no facial droop, no motor deficits in LUE or BLE.  Family brought pt to the ED where he arrived at 2343 hrs.  He was seen in triage where a code stroke was called at 2355 hrs.  Pt was cleared for CT by Dr Claudine Mouton at 0001, with arrival of Dr Janann Colonel and RRT at [place].  Pt arrived in CT at 0004 hrs.   Upon completion of CT pt was taken to ED B16 where his NIHSS score was a 0.  Pt  continued with right hand weakness, inability to grasp a pen and write his name or make a fist.  Pt does state that movement of his fingers has increased since arrival.  TPA option was presented to Pt and his wife by Dr Janann Colonel but was declined by pt and wife. CT head results of no acute intracranial pathology were called to Dr Stark Jock at [phone].  Pt will remain within the window for TPA until 0300 hrs.

## 2014-12-31 NOTE — Evaluation (Signed)
Occupational Therapy Evaluation Patient Details Name: Eduardo [name] MRN: [id number] DOB: 1930-08-16 Today's Date: 12/31/2014    History of Present Illness This 81 y.o. male admitted with acute onset of Rt hand weakness.  MRI revealed:  small acute ischemic nonhemorrhagic infarct posterior Lt frontal lobe, and a tiny acute ischemic nonhemorrhagic infarct Lt occipital lobe.  PMH includes COPD, A-Fib, MI, HTN, CAD   Clinical Impression   Pt admitted with above.  He demonstrates decreased coordination Rt hand and impaired balance.  He requires supervision for ADLs.  Wife is very supportive.  HEP provided for Rt hand.  Recommend OPOT    Follow Up Recommendations  Outpatient OT;Supervision - Intermittent    Equipment Recommendations  None recommended by OT    Recommendations for Other Services       Precautions / Restrictions Precautions Precautions: Fall Restrictions Weight Bearing Restrictions: No      Mobility Bed Mobility Overal bed mobility: Modified Independent                Transfers Overall transfer level: Needs assistance   Transfers: Sit to/from Stand;Stand Pivot Transfers Sit to Stand: Supervision Stand pivot transfers: Supervision            Balance Overall balance assessment: Needs assistance Sitting-balance support: Feet supported Sitting balance-Leahy Scale: Good     Standing balance support: No upper extremity supported Standing balance-Leahy Scale: Fair                              ADL Overall ADL's : Needs assistance/impaired Eating/Feeding: Modified independent   Grooming: Wash/dry hands;Wash/dry face;Brushing hair;Supervision/safety;Standing   Upper Body Bathing: Set up;Sitting   Lower Body Bathing: Supervison/ safety;Sit to/from stand   Upper Body Dressing : Supervision/safety;Set up;Sitting   Lower Body Dressing: Supervision/safety;Sit to/from stand   Toilet Transfer: Supervision/safety;Ambulation;Comfort  height toilet   Toileting- Clothing Manipulation and Hygiene: Supervision/safety;Sit to/from stand   Tub/ Shower Transfer: Min guard;Ambulation;Grab bars;Shower seat   Functional mobility during ADLs: Min guard;Supervision/safety;Cane General ADL Comments: Pt with DOE 3/4 - h/o COPD     Vision Vision Assessment?: Yes Eye Alignment: Within Functional Limits Ocular Range of Motion: Within Functional Limits Tracking/Visual Pursuits: Able to track stimulus in all quads without difficulty Saccades: Within functional limits Visual Fields: No apparent deficits   Agricultural engineer Tested?: Yes   Praxis Praxis Praxis tested?: Within functional limits    Pertinent Vitals/Pain Pain Assessment: No/denies pain     Hand Dominance Right   Extremity/Trunk Assessment Upper Extremity Assessment Upper Extremity Assessment: RUE deficits/detail RUE Deficits / Details: strength shoulder to wrist 5/5.  Pt with full Active flexion and extension.  Able to opose to digit 4, not 5.    RUE Coordination: decreased fine motor   Lower Extremity Assessment Lower Extremity Assessment: Defer to PT evaluation       Communication Communication Communication: No difficulties   Cognition   Behavior During Therapy: WFL for tasks assessed/performed Overall Cognitive Status: Within Functional Limits for tasks assessed       Memory: Decreased recall of precautions             General Comments       Exercises Exercises: Other exercises Other Exercises Other Exercises: Pt instructed in HEP for coordination Rt hand - written info provided    Shoulder Instructions      Home Living Family/patient expects to be discharged to:: Private residence Living Arrangements: Spouse/significant other  Available Help at Discharge: Family;Available 24 hours/day Type of Home: House Home Access: Stairs to enter [employer] of Steps: 3 Entrance Stairs-Rails: Right;Left;Can reach  both Home Layout: Two level;Able to live on main level with bedroom/bathroom Alternate Level Stairs-Number of Steps: 16-20   Bathroom Shower/Tub: Occupational psychologist: Handicapped height                Prior Functioning/Environment Level of Independence: Independent;Independent with assistive device(s)        Comments: pt ambulates with cane.  Drives.  H/o falls     OT Diagnosis: Hemiplegia dominant side   OT Problem List: Decreased coordination   OT Treatment/Interventions:      OT Goals(Current goals can be found in the care plan section) Acute Rehab OT Goals OT Goal Formulation: All assessment and education complete, DC therapy  OT Frequency:     Barriers to D/C:            Co-evaluation              End of Session Nurse Communication: Mobility status  Activity Tolerance: Patient tolerated treatment well Patient left: in bed;with call bell/phone within reach;with family/visitor present   Time: 1344-1416 OT Time Calculation (min): 32 min Charges:  OT General Charges $OT Visit: 1 Procedure OT Evaluation $Initial OT Evaluation Tier I: 1 Procedure OT Treatments $Self Care/Home Management : 8-22 mins G-Codes:    Cloyde Oregel M 01/12/15, 2:34 PM

## 2014-12-31 NOTE — H&P (Signed)
[facility] History and Physical  Patient: Eduardo [name]  MRN: [id number]  DOB: 20-Apr-1931  DOS: the patient was seen and examined on 12/31/2014 PCP: [name], MD  Referring physician: Dr. Claudine Mouton Chief Complaint: Right hand weakness  HPI: Eduardo [name] is a 81 y.o. male with Past medical history of anemia, atrial fibrillation, coronary artery disease, COPD, dyslipidemia, not on any anticoagulation,. The patient presents with complaints of right hand weakness. Patient mentions that he was sitting and suddenly he felt that his right hand was significantly weak and he was having making a tight grip with that. He did not have any other weakness anywhere no tongue tingling or numbness no dizziness no lightheadedness no recent changes no slurred speech. And there are no recent change in his medication. Neck: No chills, He mentions he is compliant with all his medications.  Pt denies any fever, chills, headache, runny nose, neck pain, choking episodes, cough, rash anywhere,  chest pain, palpitation, shortness of breath, orthopnea, PND,  nausea, vomiting, diarrhea, constipation, abdominal pain, acid reflux, active bleeding, black color BM,  burning urination, increase urinary frequency,  fall, trauma or injury, dizziness, pedal edema, difficulty swallowing, focal neurological deficit.   The patient is coming from home  At his baseline ambulates without any support And is independent for most of his ADL manages her medication on his own.  Review of Systems: as mentioned in the history of present illness.  A comprehensive review of the other systems is negative.  Past Medical History  Diagnosis Date  . Anemia   . Anxiety   . Atrial fibrillation   . CAD (coronary artery disease)   . Hyperlipidemia   . COPD (chronic obstructive pulmonary disease)   . Allergy   . Osteoarthritis   . Hx of colonic polyps   . Hyperplastic polyp of intestine 2001  . Adenomatous polyp 2001    . Hypertension 12/25/11    pt denies this history  . Myocardial infarction 06/2010    "twice; 1 day apart; during/after cath"  . Exertional dyspnea   . S/P appy   . Arrhythmia    Past Surgical History  Procedure Laterality Date  . Appendectomy    . Carpal tunnel release      left  . Mastoidectomy      x3 on right  . Pilonidal cyst / sinus excision    . Knee arthroscopy      left, right knee 10/2009  . Shoulder arthroscopy      right  . Foot fracture surgery      right heel repair  . Nasal septum surgery      nasal septal repair  . Penile prosthesis implant      x 2--got infection after 2nd and had to remove  . Cataract extraction w/ intraocular lens implant  2/13    Dr [name]  . Carpal tunnel release  10/11/11    Dr Rush Barer  . Tonsillectomy and adenoidectomy    . Fracture surgery    . Cardiac catheterization  2012    50% LAD and luminal irreg in RCA, 1/12  Post cath MI from damage  . Penile prosthesis  removal     Social History:  reports that he quit smoking about 36 years ago. His smoking use included Cigarettes. He has a 75 pack-year smoking history. He has never used smokeless tobacco. He reports that he drinks alcohol. He reports that he does not use illicit drugs.  Allergies  Allergen Reactions  .  Penicillins Itching  . Sulfonamide Derivatives Other (See Comments)    "it affected my kidneys"    Family History  Problem Relation Age of Onset  . Diabetes Neg Hx   . Hypertension Neg Hx   . Heart attack Father   . Cancer Mother     ? leukemia    Prior to Admission medications   Medication Sig Start Date End Date Taking? Authorizing Provider  albuterol (PROAIR HFA) 108 (90 BASE) MCG/ACT inhaler Inhale 2 puffs into the lungs every 6 (six) hours as needed for wheezing or shortness of breath. 12/08/14  Yes Collene Gobble, MD  ARIPiprazole (ABILIFY) 10 MG tablet Take 5 mg by mouth daily.  05/26/14  Yes Historical Provider, MD  aspirin 81 MG tablet  Take 81 mg by mouth daily.     Yes Historical Provider, MD  atorvastatin (LIPITOR) 10 MG tablet TAKE 1 TABLET BY MOUTH DAILY 06/17/14  Yes Belva Crome, MD  Azelastine HCl (ASTEPRO) 0.15 % SOLN USE 2 SPRAYS IN EACH NOSTRIL TWICE A DAY Patient taking differently: Place 2 sprays into both nostrils at bedtime.  08/11/12  Yes Collene Gobble, MD  cetirizine (ZYRTEC) 10 MG tablet Take 10 mg by mouth daily.   Yes Historical Provider, MD  co-enzyme Q-10 30 MG capsule Take 30 mg by mouth daily.   Yes Historical Provider, MD  escitalopram (LEXAPRO) 20 MG tablet Take 20 mg by mouth daily.    Yes Historical Provider, MD  fenofibrate 160 MG tablet Take 1 tablet (160 mg total) by mouth daily. 09/21/14  Yes Belva Crome, MD  Fluticasone Furoate-Vilanterol (BREO ELLIPTA) 100-25 MCG/INH AEPB Inhale 1 puff into the lungs daily. Patient taking differently: Inhale 2 puffs into the lungs at bedtime.  09/02/14  Yes Collene Gobble, MD  Multiple Vitamin (MULTIVITAMIN) tablet Take 1 tablet by mouth daily.   Yes Historical Provider, MD  Tiotropium Bromide Monohydrate (SPIRIVA RESPIMAT) 2.5 MCG/ACT AERS Inhale 2 puffs into the lungs daily. Patient taking differently: Inhale 1 puff into the lungs every morning.  11/01/14  Yes Collene Gobble, MD  verapamil (CALAN-SR) 240 MG CR tablet Take 1 tablet (240 mg total) by mouth at bedtime. 08/26/14  Yes Belva Crome, MD    Physical Exam: Filed Vitals:   12/31/14 0033 12/31/14 0216 12/31/14 0241 12/31/14 0458  BP:  145/75 162/74 129/61  Pulse:  80 77 65  Temp: 99.2 F (37.3 C)  98.4 F (36.9 C) 97.9 F (36.6 C)  TempSrc:   Oral Oral  Resp:  16 18 18   Height:   5\' 3"  (1.6 m)   Weight:   73.1 kg (161 lb 2.5 oz)   SpO2:  95% 95% 99%    General: Alert, Awake and Oriented to Time, Place and Person. Appear in mild distress Eyes: PERRL ENT: Oral Mucosa clear moist. Neck: no JVD Cardiovascular: S1 and S2 Present, no Murmur, Peripheral Pulses Present Respiratory: Bilateral Air  entry equal and Decreased,  Clear to Auscultation, no Crackles, no wheezes Abdomen: Bowel Sound present, Soft and no tender Skin: no Rash Extremities: no Pedal edema, no calf tenderness Neurologic: Mental status AAOx3, speech normal, attention normal,  Cranial Nerves PERRL, EOM normal and present, facial sensation to light touch present,  Motor strength right hand grip 1 out of 5 Right elbow strength 5/5 Elsewhere 5 x 5  Sensation present to light touch,  reflexes present knee and biceps, babinski negative,  Cerebellar test normal finger nose finger.  Labs on Admission:  CBC:  Recent Labs Lab 12/31/14 0012 12/31/14 0016  WBC  --  8.5  NEUTROABS  --  6.4  HGB 12.6* 11.5*  HCT 37.0* 35.8*  MCV  --  87.5  PLT  --  199    CMP     Component Value Date/Time   NA 137 12/31/2014 0016   NA 142 09/08/2011 0956   K 4.2 12/31/2014 0016   K 3.9 09/08/2011 0956   CL 102 12/31/2014 0016   CL 105 09/08/2011 0956   CO2 26 12/31/2014 0016   CO2 26 09/08/2011 0956   GLUCOSE 107* 12/31/2014 0016   GLUCOSE 97 09/08/2011 0956   BUN 31* 12/31/2014 0016   BUN 20* 09/08/2011 0956   CREATININE 1.32* 12/31/2014 0016   CREATININE 0.98 09/08/2011 0956   CALCIUM 9.9 12/31/2014 0016   CALCIUM 9.0 09/08/2011 0956   PROT 6.9 12/31/2014 0016   ALBUMIN 4.1 12/31/2014 0016   AST 33 12/31/2014 0016   ALT 20 12/31/2014 0016   ALKPHOS 44 12/31/2014 0016   BILITOT 0.7 12/31/2014 0016   GFRNONAA 49* 12/31/2014 0016   GFRAA 57* 12/31/2014 0016    No results for input(s): LIPASE, AMYLASE in the last 168 hours.  No results for input(s): CKTOTAL, CKMB, CKMBINDEX, TROPONINI in the last 168 hours. BNP (last 3 results) No results for input(s): BNP in the last 8760 hours.  ProBNP (last 3 results) No results for input(s): PROBNP in the last 8760 hours.   Radiological Exams on Admission: Dg Chest 2 View  12/31/2014   CLINICAL DATA:  81 year old male with right-sided weakness, shortness of breath  and wheezing  EXAM: CHEST  2 VIEW  COMPARISON:  Radiograph dated 12/1911  FINDINGS: Two views of the chest demonstrate emphysematous changes of the lungs. There is an area of increased opacity in the lingula similar to prior study likely atelectasis/ scarring. Pneumonia is not excluded. Clinical correlation is recommended. Stable cardiomegaly. There is no pleural effusion or pneumothorax. Osteopenia with degenerative changes of the spine.  IMPRESSION: Lingular atelectasis/scarring. Pneumonia is less likely. Clinical correlation is recommended.   Electronically Signed   By: Anner Crete M.D.   On: 12/31/2014 01:59   Ct Head Wo Contrast  12/31/2014   CLINICAL DATA:  81 year old male with right hand weakness. Code stroke.  EXAM: CT HEAD WITHOUT CONTRAST  TECHNIQUE: Contiguous axial images were obtained from the base of the skull through the vertex without intravenous contrast.  COMPARISON:  None.  FINDINGS: The ventricles are dilated and the sulci are prominent compatible with age-related atrophy. A septum pellucidum and cavum vergae noted. Periventricular and deep white matter hypodensities represent chronic microvascular ischemic changes. There is no intracranial hemorrhage. No mass effect or midline shift identified.  There is mild mucoperiosteal thickening of paranasal sinuses. Partial right mastoid surgery noted. The visualized mastoid air cells are well aerated. The calvarium is intact.  IMPRESSION: No acute intracranial pathology.  Age-related atrophy and chronic microvascular ischemic disease.  If symptoms persist and there are no contraindications, MRI may provide better evaluation if clinically indicated.  Critical Value/emergent results were called by telephone at the time of interpretation on 12/31/2014 at 12:17 am to Dr. Stark Jock, who verbally acknowledged these results.   Electronically Signed   By: Anner Crete M.D.   On: 12/31/2014 00:19   EKG: Independently reviewed. normal sinus rhythm,  nonspecific ST and T waves changes.  Assessment/Plan Principal Problem:   Right hand weakness Active Problems:  Essential hypertension   Atrial fibrillation   COPD (chronic obstructive pulmonary disease) with emphysema   Chronic diastolic HF (heart failure)   Spinal stenosis, lumbar region, with neurogenic claudication   TIA (transient ischemic attack)   1. Right hand weakness The patient is presenting with numbness of right hand weakness. CT of the head is negative for any acute abnormality. The patient initially presented within window period for TPA but since his NIHSS was low family decided to again stop using TPA. Patient will be given aspirin. Will check MRI/MRA of brain. PTOT speech consultation. Monitor on telemetry. Echogram as well as carotid Doppler. Symptoms appear more likely peripheral neuropathy finding as opposed to generalized stroke versus TIA.  2. COPD. Does not appear to be flared up. We will continue with home inhalers.  3. Essential hypertension. Continue home medications.  4. Dyslipidemia. Increasing Lipitor from 10 mg to 40 mg.  5. Mood disorder. Continuing Lexapro as well as Abilify.  Advance goals of care discussion: Full code   Consults: ED discussed with neurology.  DVT Prophylaxis: subcutaneous Heparin Nutrition: Nothing by mouth pending stroke evaluation  Family Communication: family was present at bedside, opportunity was given to ask question and all questions were answered satisfactorily at the time of interview. Disposition: Admitted as observation, telemetry unit.  Author: Berle Mull, MD [facility] Pager: [phone] 12/31/2014  If 7PM-7AM, please contact night-coverage [email or web address] Password [email or web address]

## 2014-12-31 NOTE — Progress Notes (Signed)
  Echocardiogram 2D Echocardiogram has been performed.  Eduardo [name] 12/31/2014, 1:01 PM

## 2014-12-31 NOTE — Progress Notes (Signed)
*  PRELIMINARY RESULTS* Vascular Ultrasound Carotid Duplex (Doppler) has been completed.   Findings suggest 1-39% internal carotid artery stenosis bilaterally. Vertebral arteries are patent with antegrade flow.  12/31/2014 3:30 PM Maudry Mayhew, RVT, RDCS, RDMS

## 2014-12-31 NOTE — Discharge Summary (Signed)
Physician Discharge Summary  Eduardo [name] [id number] DOB: 04-Apr-1931 DOA: 12/30/2014  PCP: [name], MD  Admit date: 12/30/2014 Discharge date: 12/31/2014  Time spent: 35 minutes  Recommendations for Outpatient Follow-up:  1. Follow up with cardiology for A fib.  2. Follow up with neurology   Discharge Diagnoses:    Acute stroke.    Essential hypertension   Atrial fibrillation   COPD (chronic obstructive pulmonary disease) with emphysema   Chronic diastolic HF (heart failure)   Spinal stenosis, lumbar region, with neurogenic claudication   TIA (transient ischemic attack)   Discharge Condition: stable.   Diet recommendation: heart healthy  Filed Weights   12/30/14 2358 12/31/14 0019 12/31/14 0241  Weight: 73.936 kg (163 lb) 72.122 kg (159 lb) 73.1 kg (161 lb 2.5 oz)    History of present illness:  Eduardo [name] is a 81 y.o. male with Past medical history of anemia, atrial fibrillation, coronary artery disease, COPD, dyslipidemia, not on any anticoagulation,. The patient presents with complaints of right hand weakness. Patient mentions that he was sitting and suddenly he felt that his right hand was significantly weak and he was having making a tight grip with that. He did not have any other weakness anywhere no tongue tingling or numbness no dizziness no lightheadedness no recent changes no slurred speech. And there are no recent change in his medication. Neck: No chills, He mentions he is compliant with all his medications.  Pt denies any fever, chills, headache, runny nose, neck pain, choking episodes, cough, rash anywhere,  chest pain, palpitation, shortness of breath, orthopnea, PND,  nausea, vomiting, diarrhea, constipation, abdominal pain, acid reflux, active bleeding, black color BM,  burning urination, increase urinary frequency,  fall, trauma or injury, dizziness, pedal edema, difficulty swallowing, focal neurological deficit.   Hospital  Course:  1.Acute stroke:  The patient is presenting with numbness of right hand weakness. CT of the head is negative for any acute abnormality. The patient initially presented within window period for TPA but since his NIHSS was low family decided to again stop using TPA. MRI/MRA of brain; Small acute ischemic nonhemorrhagic 10 x 9 x 12 mm cortical infarct involving the posterior left frontal lobe in the left precentral gyrus. Echogram as well as carotid Doppler. Neurology recommending eliquis. Discussed with Dr Tamala Julian ok to use eliquis.  Risk benefit discussed with patient wife regarding anticoagulation.  Patient wants to be discharge today/ will complete stroke work up  2. COPD. We will continue with home inhalers.  3. Essential hypertension. Continue home medications.  4. Dyslipidemia. Increasing Lipitor from 10 mg to 40 mg. Continue with fenofibrate's.  Follow up with PCP for further options.   5. Mood disorder. Continuing Lexapro as well as Abilify.  Procedures: ECHO; no source of embolism Doppler: no Significant ICA stenosis/   Consultations:  Neurology  Phone conversation with Dr Tamala Julian   Discharge Exam: Filed Vitals:   12/31/14 1440  BP: 148/55  Pulse: 71  Temp: 97.9 F (36.6 C)  Resp: 18    General: Alert in no distress.  Cardiovascular: S 1, S 2 IRR Respiratory: CTA  Discharge Instructions    Current Discharge Medication List    CONTINUE these medications which have NOT CHANGED   Details  albuterol (PROAIR HFA) 108 (90 BASE) MCG/ACT inhaler Inhale 2 puffs into the lungs every 6 (six) hours as needed for wheezing or shortness of breath. Qty: 3 Inhaler, Refills: 3    ARIPiprazole (ABILIFY) 10 MG tablet Take  5 mg by mouth daily.  Refills: 0    aspirin 81 MG tablet Take 81 mg by mouth daily.      atorvastatin (LIPITOR) 10 MG tablet TAKE 1 TABLET BY MOUTH DAILY Qty: 90 tablet, Refills: 3    Azelastine HCl (ASTEPRO) 0.15 % SOLN USE 2 SPRAYS IN  EACH NOSTRIL TWICE A DAY Qty: 90 mL, Refills: 1    cetirizine (ZYRTEC) 10 MG tablet Take 10 mg by mouth daily.    co-enzyme Q-10 30 MG capsule Take 30 mg by mouth daily.    escitalopram (LEXAPRO) 20 MG tablet Take 20 mg by mouth daily.     fenofibrate 160 MG tablet Take 1 tablet (160 mg total) by mouth daily. Qty: 90 tablet, Refills: 1    Fluticasone Furoate-Vilanterol (BREO ELLIPTA) 100-25 MCG/INH AEPB Inhale 1 puff into the lungs daily. Qty: 180 each, Refills: 3    Multiple Vitamin (MULTIVITAMIN) tablet Take 1 tablet by mouth daily.    Tiotropium Bromide Monohydrate (SPIRIVA RESPIMAT) 2.5 MCG/ACT AERS Inhale 2 puffs into the lungs daily. Qty: 3 Inhaler, Refills: 3    verapamil (CALAN-SR) 240 MG CR tablet Take 1 tablet (240 mg total) by mouth at bedtime. Qty: 90 tablet, Refills: 2       Allergies  Allergen Reactions  . Penicillins Itching  . Sulfonamide Derivatives Other (See Comments)    "it affected my kidneys"      The results of significant diagnostics from this hospitalization (including imaging, microbiology, ancillary and laboratory) are listed below for reference.    Significant Diagnostic Studies: Dg Chest 2 View  12/31/2014   CLINICAL DATA:  81 year old male with right-sided weakness, shortness of breath and wheezing  EXAM: CHEST  2 VIEW  COMPARISON:  Radiograph dated 12/1911  FINDINGS: Two views of the chest demonstrate emphysematous changes of the lungs. There is an area of increased opacity in the lingula similar to prior study likely atelectasis/ scarring. Pneumonia is not excluded. Clinical correlation is recommended. Stable cardiomegaly. There is no pleural effusion or pneumothorax. Osteopenia with degenerative changes of the spine.  IMPRESSION: Lingular atelectasis/scarring. Pneumonia is less likely. Clinical correlation is recommended.   Electronically Signed   By: Anner Crete M.D.   On: 12/31/2014 01:59   Ct Head Wo Contrast  12/31/2014   CLINICAL  DATA:  81 year old male with right hand weakness. Code stroke.  EXAM: CT HEAD WITHOUT CONTRAST  TECHNIQUE: Contiguous axial images were obtained from the base of the skull through the vertex without intravenous contrast.  COMPARISON:  None.  FINDINGS: The ventricles are dilated and the sulci are prominent compatible with age-related atrophy. A septum pellucidum and cavum vergae noted. Periventricular and deep white matter hypodensities represent chronic microvascular ischemic changes. There is no intracranial hemorrhage. No mass effect or midline shift identified.  There is mild mucoperiosteal thickening of paranasal sinuses. Partial right mastoid surgery noted. The visualized mastoid air cells are well aerated. The calvarium is intact.  IMPRESSION: No acute intracranial pathology.  Age-related atrophy and chronic microvascular ischemic disease.  If symptoms persist and there are no contraindications, MRI may provide better evaluation if clinically indicated.  Critical Value/emergent results were called by telephone at the time of interpretation on 12/31/2014 at 12:17 am to Dr. Stark Jock, who verbally acknowledged these results.   Electronically Signed   By: Anner Crete M.D.   On: 12/31/2014 00:19   Mr Jodene Nam Head Wo Contrast  12/31/2014   CLINICAL DATA:  Initial evaluation for acute onset right  hand weakness.  EXAM: MRI HEAD WITHOUT CONTRAST  MRA HEAD WITHOUT CONTRAST  TECHNIQUE: Multiplanar, multiecho pulse sequences of the brain and surrounding structures were obtained without intravenous contrast. Angiographic images of the head were obtained using MRA technique without contrast.  COMPARISON:  Prior CT from earlier the same day.  FINDINGS: MRI HEAD FINDINGS  Diffuse prominence of the CSF containing spaces is compatible with generalized age-related cerebral atrophy. Patchy and confluent T2/FLAIR hyperintensity within the periventricular and deep white matter both cerebral hemispheres most consistent with chronic  small vessel ischemic disease. Small remote lacunar infarct within the right lentiform nuclei.  There is a small cortical infarct measuring 10 x 9 x 12 mm in the posterior left frontal region (series 4, image 37). This involves the precentral gyrus. Possible minimal subcortical extension. No significant mass effect or associated hemorrhage.  Additional tiny 5 mm cortical infarct within the left occipital lobe (series 4, image 18). No associated hemorrhage or mass effect.  No other acute intracranial infarct identified. Gray-white matter differentiation otherwise maintained. Normal intravascular flow voids are maintained.  No mass lesion, midline shift, or mass effect. Ventricular prominence related global parenchymal volume loss present without evidence of hydrocephalus. Cavum septum pellucidum noted. No extra-axial fluid collection.  Craniocervical junction within normal limits. Multilevel degenerative changes noted within the visualized upper cervical spine. Pituitary gland normal.  No acute abnormality about the orbits. Sequelae of prior bilateral lens extraction noted.  Mild mucosal thickening present within the ethmoidal air cells and maxillary sinuses bilaterally. No air-fluid levels to suggest active sinus infection. No mastoid effusion.  Bone marrow signal in cyst within normal limits. Scalp soft tissues are unremarkable.  MRA HEAD FINDINGS  ANTERIOR CIRCULATION:  Visualized portions of the distal cervical segments of the internal carotid arteries are widely patent with antegrade flow. The petrous, cavernous, and supra clinoid segments are widely patent. A1 segments, anterior communicating artery, and anterior cerebral arteries are opacified bilaterally. Left A2 segment is diminutive with multi focal atheromatous irregularity.  M1 segments patent without stenosis or occlusion. MCA bifurcations normal. Distal MCA branches symmetric bilaterally. Distal branch atheromatous irregularity present within the distal  MCA branches.  POSTERIOR CIRCULATION:  Vertebral arteries are patent to the vertebrobasilar junction. Posterior inferior cerebellar arteries patent proximally. Basilar artery patent. Superior cerebellar arteries well opacified. P1 segments are slightly hypoplastic bilaterally with bilateral posterior communicating arteries present. Posterior cerebral arteries are well opacified to their distal aspects.  No aneurysm or vascular malformation.  IMPRESSION: MRI HEAD IMPRESSION:  1. Small acute ischemic nonhemorrhagic 10 x 9 x 12 mm cortical infarct involving the posterior left frontal lobe in the left precentral gyrus. 2. Additional tiny acute ischemic nonhemorrhagic 5 mm cortical infarct within the left occipital lobe. 3. Advanced age-related cerebral atrophy with mild chronic microvascular ischemic disease.  MRA HEAD IMPRESSION:  1. No proximal or large vessel occlusion identified within the intracranial circulation. 2. No correctable or focal hemosiderin and likely significant stenosis identified. 3. Diminutive left A2 segment with moderate to advanced multi focal atheromatous irregularity. 4. Distal branch atheromatous irregularity within the MCA branches bilaterally.   Electronically Signed   By: Jeannine Boga M.D.   On: 12/31/2014 05:45   Mr Brain Wo Contrast  12/31/2014   CLINICAL DATA:  Initial evaluation for acute onset right hand weakness.  EXAM: MRI HEAD WITHOUT CONTRAST  MRA HEAD WITHOUT CONTRAST  TECHNIQUE: Multiplanar, multiecho pulse sequences of the brain and surrounding structures were obtained without intravenous contrast. Angiographic images of  the head were obtained using MRA technique without contrast.  COMPARISON:  Prior CT from earlier the same day.  FINDINGS: MRI HEAD FINDINGS  Diffuse prominence of the CSF containing spaces is compatible with generalized age-related cerebral atrophy. Patchy and confluent T2/FLAIR hyperintensity within the periventricular and deep white matter both  cerebral hemispheres most consistent with chronic small vessel ischemic disease. Small remote lacunar infarct within the right lentiform nuclei.  There is a small cortical infarct measuring 10 x 9 x 12 mm in the posterior left frontal region (series 4, image 37). This involves the precentral gyrus. Possible minimal subcortical extension. No significant mass effect or associated hemorrhage.  Additional tiny 5 mm cortical infarct within the left occipital lobe (series 4, image 18). No associated hemorrhage or mass effect.  No other acute intracranial infarct identified. Gray-white matter differentiation otherwise maintained. Normal intravascular flow voids are maintained.  No mass lesion, midline shift, or mass effect. Ventricular prominence related global parenchymal volume loss present without evidence of hydrocephalus. Cavum septum pellucidum noted. No extra-axial fluid collection.  Craniocervical junction within normal limits. Multilevel degenerative changes noted within the visualized upper cervical spine. Pituitary gland normal.  No acute abnormality about the orbits. Sequelae of prior bilateral lens extraction noted.  Mild mucosal thickening present within the ethmoidal air cells and maxillary sinuses bilaterally. No air-fluid levels to suggest active sinus infection. No mastoid effusion.  Bone marrow signal in cyst within normal limits. Scalp soft tissues are unremarkable.  MRA HEAD FINDINGS  ANTERIOR CIRCULATION:  Visualized portions of the distal cervical segments of the internal carotid arteries are widely patent with antegrade flow. The petrous, cavernous, and supra clinoid segments are widely patent. A1 segments, anterior communicating artery, and anterior cerebral arteries are opacified bilaterally. Left A2 segment is diminutive with multi focal atheromatous irregularity.  M1 segments patent without stenosis or occlusion. MCA bifurcations normal. Distal MCA branches symmetric bilaterally. Distal branch  atheromatous irregularity present within the distal MCA branches.  POSTERIOR CIRCULATION:  Vertebral arteries are patent to the vertebrobasilar junction. Posterior inferior cerebellar arteries patent proximally. Basilar artery patent. Superior cerebellar arteries well opacified. P1 segments are slightly hypoplastic bilaterally with bilateral posterior communicating arteries present. Posterior cerebral arteries are well opacified to their distal aspects.  No aneurysm or vascular malformation.  IMPRESSION: MRI HEAD IMPRESSION:  1. Small acute ischemic nonhemorrhagic 10 x 9 x 12 mm cortical infarct involving the posterior left frontal lobe in the left precentral gyrus. 2. Additional tiny acute ischemic nonhemorrhagic 5 mm cortical infarct within the left occipital lobe. 3. Advanced age-related cerebral atrophy with mild chronic microvascular ischemic disease.  MRA HEAD IMPRESSION:  1. No proximal or large vessel occlusion identified within the intracranial circulation. 2. No correctable or focal hemosiderin and likely significant stenosis identified. 3. Diminutive left A2 segment with moderate to advanced multi focal atheromatous irregularity. 4. Distal branch atheromatous irregularity within the MCA branches bilaterally.   Electronically Signed   By: Jeannine Boga M.D.   On: 12/31/2014 05:45    Microbiology: No results found for this or any previous visit (from the past 240 hour(s)).   Labs: Basic Metabolic Panel:  Recent Labs Lab 12/31/14 0012 12/31/14 0016  NA 139 137  K 3.9 4.2  CL 103 102  CO2  --  26  GLUCOSE 101* 107*  BUN 34* 31*  CREATININE 1.40* 1.32*  CALCIUM  --  9.9   Liver Function Tests:  Recent Labs Lab 12/31/14 0016  AST 33  ALT 20  ALKPHOS 44  BILITOT 0.7  PROT 6.9  ALBUMIN 4.1   No results for input(s): LIPASE, AMYLASE in the last 168 hours. No results for input(s): AMMONIA in the last 168 hours. CBC:  Recent Labs Lab 12/31/14 0012 12/31/14 0016  WBC   --  8.5  NEUTROABS  --  6.4  HGB 12.6* 11.5*  HCT 37.0* 35.8*  MCV  --  87.5  PLT  --  199   Cardiac Enzymes: No results for input(s): CKTOTAL, CKMB, CKMBINDEX, TROPONINI in the last 168 hours. BNP: BNP (last 3 results) No results for input(s): BNP in the last 8760 hours.  ProBNP (last 3 results) No results for input(s): PROBNP in the last 8760 hours.  CBG: No results for input(s): GLUCAP in the last 168 hours.     Signed:  Niel Hummer A  [facility] 12/31/2014, 3:26 PM

## 2014-12-31 NOTE — ED Notes (Signed)
Pt not receiving TPA at this time, but will be kept in the window until 0300.

## 2014-12-31 NOTE — Progress Notes (Addendum)
ANTICOAGULATION CONSULT NOTE - Initial Consult  Pharmacy Consult for apixaban Indication: atrial fibrillation  Allergies  Allergen Reactions  . Penicillins Itching  . Sulfonamide Derivatives Other (See Comments)    "it affected my kidneys"    Patient Measurements: Height: 5\' 3"  (160 cm) Weight: 161 lb 2.5 oz (73.1 kg) IBW/kg (Calculated) : 56.9  Vital Signs: Temp: 97.9 F (36.6 C) (07/22 1000) Temp Source: Oral (07/22 1000) BP: 115/46 mmHg (07/22 1004) Pulse Rate: 45 (07/22 1004)  Labs:  Recent Labs  12/31/14 0012 12/31/14 0016  HGB 12.6* 11.5*  HCT 37.0* 35.8*  PLT  --  199  APTT  --  32  LABPROT  --  14.7  INR  --  1.13  CREATININE 1.40* 1.32*    Estimated Creatinine Clearance: 39.4 mL/min (by C-G formula based on Cr of 1.32).   Medical History: Past Medical History  Diagnosis Date  . Anemia   . Anxiety   . Atrial fibrillation   . CAD (coronary artery disease)   . Hyperlipidemia   . COPD (chronic obstructive pulmonary disease)   . Allergy   . Osteoarthritis   . Hx of colonic polyps   . Hyperplastic polyp of intestine 2001  . Adenomatous polyp 2001  . Hypertension 12/25/11    pt denies this history  . Myocardial infarction 06/2010    "twice; 1 day apart; during/after cath"  . Exertional dyspnea   . S/P appy   . Arrhythmia    Assessment: 81 year old male presenting with right handed weakness. Patient was classified as code stroke. Alteplase was not given since pt's NIH was low and pt's family wanted to hold off. CT head showed no acute processes including hemorrhage. MRI showed a small acute ischemic nonhemorrhagic cortical infarct in the left fontal and left occipital lobes. MRA showed no vessel occlusion   PMH, A Fib, CAD, HLD, COPD, CVA  AC: None pta.   Renal: SCr 1.32  Heme: H&H 11.5/35.8, Plt 199  Plan:  Discontinue heparin Initiate apixaban 5 mg BID Monitor for s/sx of bleeding Educate patient  Pharmacy will sign off, but will  continue to monitor patient and are available as needed  Levester Fresh, PharmD, BCPS Clinical Pharmacist Pager [phone] 12/31/2014 1:09 PM

## 2014-12-31 NOTE — Progress Notes (Signed)
Discharge orders received. Pt and spouse educated on discharge instructions and stroke prevention. Pt verbalized understanding. Pt given discharge packet and prescriptions. IV removed. Pt and belongings taken downstairs by staff via wheelchair.

## 2014-12-31 NOTE — Care Management Note (Signed)
Case Management Note  Patient Details  Name: Eduardo [name] MRN: [id number] Date of Birth: 07-03-1930  Subjective/Objective:                    Action/Plan: Met with patient to provide Eliquis 30 day free card.  CM informed patient that a benefits check is underway to determine his copay.  Bedside RN will be updated when results are available.  Expected Discharge Date:                  Expected Discharge Plan:  Home/Self Care  In-House Referral:     Discharge planning Services  Medication Assistance, CM Consult  Post Acute Care Choice:    Choice offered to:     DME Arranged:    DME Agency:     HH Arranged:    HH Agency:     Status of Service:  In process, will continue to follow  Medicare Important Message Given:    Date Medicare IM Given:    Medicare IM give by:    Date Additional Medicare IM Given:    Additional Medicare Important Message give by:     If discussed at [place] of Stay Meetings, dates discussed:    Additional Comments:  Rolm Baptise, RN 12/31/2014, 2:58 PM

## 2015-01-01 ENCOUNTER — Other Ambulatory Visit: Payer: Self-pay | Admitting: Neurology

## 2015-01-01 DIAGNOSIS — I63412 Cerebral infarction due to embolism of left middle cerebral artery: Secondary | ICD-10-CM

## 2015-01-03 ENCOUNTER — Telehealth: Payer: Self-pay | Admitting: Neurology

## 2015-01-03 ENCOUNTER — Telehealth: Payer: Self-pay | Admitting: Interventional Cardiology

## 2015-01-03 NOTE — Telephone Encounter (Signed)
New message     Pt wife states pt had mild stroke last week. Pt wife states dr. Russella Dar lipitor from 10mg  to 40mg .   Pt wife states pt has taken 20mg  instead of 40mg  because they thought 40mg  was too much of an increase for pt Please call to discuss

## 2015-01-03 NOTE — Telephone Encounter (Signed)
Returned pt call. lmtcb if assistance is still needed 

## 2015-01-03 NOTE — Telephone Encounter (Signed)
Returned pt call. Pt would like clarification on his Atorvastatin. When pt was seen in the hospital last week for a stroke, he told the attending physician he was taking 20mg  of Atorvastatin. It was doubled to 40mg  qd. Because of his elevated triglycerides. When he returned home he realized he is actually taking 10mg . He is concerned that 40mg  is to high and would like clarification from [facility]. He also sts Dr.Smith wants to see him this wk. Adv pt I will fwd Dr.Smith the message and call back with clarification on med and f/u appt info

## 2015-01-03 NOTE — Telephone Encounter (Signed)
Follow up     Pt returning your call.   Please call to discuss

## 2015-01-03 NOTE — Telephone Encounter (Signed)
Pt wife called Dr Erlinda Hong saw pt in hospital over the weekend and she said the he said that pt could have enzyme injection twice a month to help Please call wife dg

## 2015-01-04 NOTE — Telephone Encounter (Signed)
Follow up      Calling to see what Dr Tamala Julian said about the lipitor dosage increase.

## 2015-01-04 NOTE — Telephone Encounter (Signed)
Called back to wife and answered all her questions.  Rosalin Hawking, MD PhD Stroke Neurology 01/04/2015 5:46 PM

## 2015-01-05 ENCOUNTER — Telehealth: Payer: Self-pay | Admitting: Emergency Medicine

## 2015-01-05 LAB — HEMOGLOBIN A1C
HEMOGLOBIN A1C: 5.8 % — AB (ref 4.8–5.6)
Mean Plasma Glucose: 120 mg/dL

## 2015-01-05 MED ORDER — FLUTICASONE FUROATE-VILANTEROL 100-25 MCG/INH IN AEPB: 2.0000 | INHALATION_SPRAY | Freq: Every day | RESPIRATORY_TRACT | Status: DC

## 2015-01-05 NOTE — Telephone Encounter (Signed)
Pt aware per Dr.Smith he should be on Atorvastatin 20mg  qd. F/u appt scheduled with Dr.Smith for 8/12 @ 9am

## 2015-01-05 NOTE — Telephone Encounter (Signed)
Spoke with pt and advised that rx for Memory Dance was went to pharmacy.

## 2015-01-05 NOTE — Telephone Encounter (Signed)
Called to give pt Dr.Smith's response.lmtcb

## 2015-01-05 NOTE — Telephone Encounter (Signed)
He should take 20 mg of atorvastatin daily not 40 and not 10. At some point over the next few weeks he needs follow-up here concerning the stroke and final decision about anticoagulation therapy

## 2015-01-07 MED ORDER — UNABLE TO FIND: Status: DC

## 2015-01-11 ENCOUNTER — Other Ambulatory Visit: Payer: Self-pay | Admitting: Physical Medicine and Rehabilitation

## 2015-01-11 DIAGNOSIS — M5416 Radiculopathy, lumbar region: Secondary | ICD-10-CM

## 2015-01-12 ENCOUNTER — Encounter: Payer: Self-pay | Admitting: Internal Medicine

## 2015-01-12 ENCOUNTER — Ambulatory Visit (INDEPENDENT_AMBULATORY_CARE_PROVIDER_SITE_OTHER): Payer: PPO | Admitting: Internal Medicine

## 2015-01-12 VITALS — BP 140/70 | HR 67 | Temp 97.5°F | Wt 159.0 lb

## 2015-01-12 DIAGNOSIS — I481 Persistent atrial fibrillation: Secondary | ICD-10-CM | POA: Diagnosis not present

## 2015-01-12 DIAGNOSIS — I119 Hypertensive heart disease without heart failure: Secondary | ICD-10-CM | POA: Diagnosis not present

## 2015-01-12 DIAGNOSIS — F39 Unspecified mood [affective] disorder: Secondary | ICD-10-CM

## 2015-01-12 DIAGNOSIS — I6319 Cerebral infarction due to embolism of other precerebral artery: Secondary | ICD-10-CM

## 2015-01-12 DIAGNOSIS — I4819 Other persistent atrial fibrillation: Secondary | ICD-10-CM

## 2015-01-12 NOTE — Progress Notes (Signed)
Pre visit review using our clinic review tool, if applicable. No additional management support is needed unless otherwise documented below in the visit note. 

## 2015-01-12 NOTE — Assessment & Plan Note (Signed)
Small left frontal CVA Hospital records reviewed Minimal residual Will do OT for hand Probably best to get PT to assess ---need to strengthen to minimize fall risk On apixaban now and increased statin

## 2015-01-12 NOTE — Assessment & Plan Note (Signed)
Good rate control Now on apixaban

## 2015-01-12 NOTE — Progress Notes (Signed)
Subjective:    Patient ID: Eduardo [name], male    DOB: [birth year], 81 y.o.   MRN: [id number]  HPI Here for follow up of recent stroke Wife is here  Had right hand weakness--couldn't move at all To ER--- CT negative but MRI confirmed small stroke Carotids were negative Echo didn't show any embolic source-- but had sig LVH  No chest pain No dizziness or syncope No SOB No palpitations Mild edema---he doesn't elevate his feet (but is trying and that helps)  Did see Dr Sharlet Salina Has MRI for back Will decide about Rx based on that  Current Outpatient Prescriptions on File Prior to Visit  Medication Sig Dispense Refill  . albuterol (PROAIR HFA) 108 (90 BASE) MCG/ACT inhaler Inhale 2 puffs into the lungs every 6 (six) hours as needed for wheezing or shortness of breath. 3 Inhaler 3  . apixaban (ELIQUIS) 5 MG TABS tablet Take 1 tablet (5 mg total) by mouth 2 (two) times daily. 60 tablet 0  . cetirizine (ZYRTEC) 10 MG tablet Take 10 mg by mouth daily.    Marland Kitchen escitalopram (LEXAPRO) 20 MG tablet Take 20 mg by mouth daily.     . fenofibrate 160 MG tablet Take 1 tablet (160 mg total) by mouth daily. 90 tablet 1  . Fluticasone Furoate-Vilanterol (BREO ELLIPTA) 100-25 MCG/INH AEPB Inhale 2 puffs into the lungs at bedtime. 180 each 3  . Multiple Vitamin (MULTIVITAMIN) tablet Take 1 tablet by mouth daily.    . verapamil (CALAN-SR) 240 MG CR tablet Take 1 tablet (240 mg total) by mouth at bedtime. 90 tablet 2   No current facility-administered medications on file prior to visit.    Allergies  Allergen Reactions  . Penicillins Itching  . Sulfonamide Derivatives Other (See Comments)    "it affected my kidneys"    Past Medical History  Diagnosis Date  . Anemia   . Anxiety   . Atrial fibrillation   . CAD (coronary artery disease)   . Hyperlipidemia   . COPD (chronic obstructive pulmonary disease)   . Allergy   . Osteoarthritis   . Hx of colonic polyps   . Hyperplastic polyp of  intestine 2001  . Adenomatous polyp 2001  . Hypertension 12/25/11    pt denies this history  . Myocardial infarction 06/2010    "twice; 1 day apart; during/after cath"  . Exertional dyspnea   . S/P appy   . Arrhythmia     Past Surgical History  Procedure Laterality Date  . Appendectomy    . Carpal tunnel release      left  . Mastoidectomy      x3 on right  . Pilonidal cyst / sinus excision    . Knee arthroscopy      left, right knee 10/2009  . Shoulder arthroscopy      right  . Foot fracture surgery      right heel repair  . Nasal septum surgery      nasal septal repair  . Penile prosthesis implant      x 2--got infection after 2nd and had to remove  . Cataract extraction w/ intraocular lens implant  2/13    Dr [name]  . Carpal tunnel release  10/11/11    Dr Rush Barer  . Tonsillectomy and adenoidectomy    . Fracture surgery    . Cardiac catheterization  2012    50% LAD and luminal irreg in RCA, 1/12  Post cath MI from damage  . Penile  prosthesis  removal      Family History  Problem Relation Age of Onset  . Diabetes Neg Hx   . Hypertension Neg Hx   . Heart attack Father   . Cancer Mother     ? leukemia    History   Social History  . Marital Status: Married    Spouse Name: N/A  . Number of Children: 4  . Years of Education: N/A   Occupational History  . Retired Chief Strategy Officer the job trainin)    Social History Main Topics  . Smoking status: Former Smoker -- 2.50 packs/day for 30 years    Types: Cigarettes    Quit date: 06/11/1978  . Smokeless tobacco: Never Used  . Alcohol Use: Yes     Comment: [id number] [id number] alcoholic  . Drug Use: No  . Sexual Activity: Not Currently   Other Topics Concern  . Not on file   Social History Narrative   Grew up in [place]   Married---2nd   2 children ([place], [place]  and near [place])--2 stepchildren   Former Smoker--quit in 1980   Alcohol use-no. Quit in 1980--alcoholic      Has living will.     Has designated wife, then step daughter [name] have health care POA.   Would accept resuscitation attempts.   No feeding tube if cognitively unaware.            Review of Systems Appetite is fine Sleeps well    Objective:   Physical Exam  Constitutional: He appears well-developed and well-nourished. No distress.  Neck: Normal range of motion. Neck supple. No thyromegaly present.  Cardiovascular: Normal rate and normal heart sounds.  Exam reveals no gallop.   No murmur heard. irregular  Pulmonary/Chest: Effort normal and breath sounds normal. No respiratory distress. He has no wheezes. He has no rales.  Musculoskeletal: He exhibits no edema.  Lymphadenopathy:    He has no cervical adenopathy.  Neurological:  Very subtle right hand weakness  Psychiatric: He has a normal mood and affect. His behavior is normal.          Assessment & Plan:

## 2015-01-12 NOTE — Assessment & Plan Note (Signed)
Still sees Dr Nicolasa Ducking Taken off abilify due to the stroke If this was embolic though--- I don't think it is related to the med (so if his mood worsens off this, it could be restarted)

## 2015-01-12 NOTE — Assessment & Plan Note (Signed)
Echo shows severe LVH No assessment of diastolic dysfunction but no CHF Will need to keep BP down  BP Readings from Last 3 Encounters:  01/12/15 140/70  12/31/14 148/55  10/25/14 130/80

## 2015-01-14 ENCOUNTER — Ambulatory Visit: Payer: PPO

## 2015-01-14 ENCOUNTER — Encounter: Payer: Self-pay | Admitting: Occupational Therapy

## 2015-01-14 ENCOUNTER — Telehealth: Payer: Self-pay | Admitting: Internal Medicine

## 2015-01-14 ENCOUNTER — Encounter: Payer: Self-pay | Admitting: Physical Therapy

## 2015-01-14 ENCOUNTER — Ambulatory Visit: Payer: PPO | Admitting: Physical Therapy

## 2015-01-14 ENCOUNTER — Ambulatory Visit: Payer: PPO | Attending: Internal Medicine | Admitting: Occupational Therapy

## 2015-01-14 DIAGNOSIS — R279 Unspecified lack of coordination: Secondary | ICD-10-CM | POA: Diagnosis present

## 2015-01-14 DIAGNOSIS — R2681 Unsteadiness on feet: Secondary | ICD-10-CM | POA: Insufficient documentation

## 2015-01-14 DIAGNOSIS — R41841 Cognitive communication deficit: Secondary | ICD-10-CM | POA: Diagnosis present

## 2015-01-14 DIAGNOSIS — Z7409 Other reduced mobility: Secondary | ICD-10-CM | POA: Diagnosis present

## 2015-01-14 DIAGNOSIS — R531 Weakness: Secondary | ICD-10-CM | POA: Insufficient documentation

## 2015-01-14 DIAGNOSIS — G8191 Hemiplegia, unspecified affecting right dominant side: Secondary | ICD-10-CM | POA: Diagnosis present

## 2015-01-14 DIAGNOSIS — R269 Unspecified abnormalities of gait and mobility: Secondary | ICD-10-CM

## 2015-01-14 DIAGNOSIS — I639 Cerebral infarction, unspecified: Secondary | ICD-10-CM

## 2015-01-14 DIAGNOSIS — R6889 Other general symptoms and signs: Secondary | ICD-10-CM | POA: Insufficient documentation

## 2015-01-14 DIAGNOSIS — IMO0002 Reserved for concepts with insufficient information to code with codable children: Secondary | ICD-10-CM

## 2015-01-14 DIAGNOSIS — I6980 Unspecified sequelae of other cerebrovascular disease: Secondary | ICD-10-CM | POA: Diagnosis present

## 2015-01-14 DIAGNOSIS — M6281 Muscle weakness (generalized): Secondary | ICD-10-CM | POA: Diagnosis present

## 2015-01-14 NOTE — Therapy (Signed)
[facility] [address] [place], [place], [address] Phone: [phone]   Fax:  [phone]  Physical Therapy Evaluation  Patient Details  Name: Eduardo [name] MRN: [id number] Date of Birth: Jun 22, 1996 Referring Provider:  Caren Griffins, MD  Encounter Date: 01/14/2015      PT End of Session - 01/14/15 1709    Visit Number 1   Number of Visits 17  eval + 16 visits   Date for PT Re-Evaluation 03/15/15   PT Start Time [id number]   PT Stop Time 1400   PT Time Calculation (min) 45 min   Equipment Utilized During Treatment Gait belt   Activity Tolerance Patient limited by fatigue   Behavior During Therapy [facility] for tasks assessed/performed      Past Medical History  Diagnosis Date  . Anemia   . Anxiety   . Atrial fibrillation   . CAD (coronary artery disease)   . Hyperlipidemia   . COPD (chronic obstructive pulmonary disease)   . Allergy   . Osteoarthritis   . Hx of colonic polyps   . Hyperplastic polyp of intestine 2001  . Adenomatous polyp 2001  . Hypertension 12/25/11    pt denies this history  . Myocardial infarction 06/2010    "twice; 1 day apart; during/after cath"  . Exertional dyspnea   . S/P appy   . Arrhythmia     Past Surgical History  Procedure Laterality Date  . Appendectomy    . Carpal tunnel release      left  . Mastoidectomy      x3 on right  . Pilonidal cyst / sinus excision    . Knee arthroscopy      left, right knee 10/2009  . Shoulder arthroscopy      right  . Foot fracture surgery      right heel repair  . Nasal septum surgery      nasal septal repair  . Penile prosthesis implant      x 2--got infection after 2nd and had to remove  . Cataract extraction w/ intraocular lens implant  2/13    Dr [name]  . Carpal tunnel release  10/11/11    Dr Rush Barer  . Tonsillectomy and adenoidectomy    . Fracture surgery    . Cardiac catheterization  2012    50% LAD and  luminal irreg in RCA, 1/12  Post cath MI from damage  . Penile prosthesis  removal      There were no vitals filed for this visit.  Visit Diagnosis:  Abnormality of gait - Plan: PT plan of care cert/re-cert  Unsteadiness - Plan: PT plan of care cert/re-cert  Weakness generalized - Plan: PT plan of care cert/re-cert  Hemiplegia affecting right dominant side - Plan: PT plan of care cert/re-cert  Decreased functional activity tolerance - Plan: PT plan of care cert/re-cert      Subjective Assessment - 01/14/15 1326    Subjective Pt is "not as steady on his feet", per wife, since pt sustained CVA on 12/30/14. She also reports pt having difficulty getting into/out of low chairs. Wife also notes decreased endurance. Prior to CVA, pt was indepedent for household ambulation and utilized cane for longer community distances. Since said CVA, pt/wife feel as though pt needs cane at all times, in all environments.   Pertinent History Chronic atrial fibrillation, combined systolic and diastolic heart failure, CAD, HTN   Limitations House hold activities;Walking   Patient Stated Goals "Improve  my endurance, get better balance, walk like I used to, and get back to my regimen."   Currently in Pain? Yes   Pain Score 3    Pain Location Back   Pain Orientation Right;Left   Pain Descriptors / Indicators Burning   Pain Type Chronic pain   Pain Radiating Towards buttocks and posterior aspect of B thighs   Pain Onset More than a month ago   Pain Frequency Intermittent   Aggravating Factors  prolonged standing, walking   Pain Relieving Factors sitting down   Effect of Pain on Daily Activities very limited in activity tolerance   Multiple Pain Sites No            [facility] PT Assessment - 01/14/15 0001    Assessment   Medical Diagnosis CVA   Onset Date/Surgical Date 12/30/14   Precautions   Precautions None   Restrictions   Weight Bearing Restrictions No   Balance Screen   Has the patient fallen  in the past 6 months Yes   How many times? 3   Has the patient had a decrease in activity level because of a fear of falling?  Yes   Is the patient reluctant to leave their home because of a fear of falling?  No   Home Environment   Living Environment Private residence   Living Arrangements Spouse/significant other   Available Help at Discharge Family   Type of [place] to enter   Entrance Stairs-Number of Steps 3   Entrance Stairs-Rails Can reach both   [place]  bedroom on main floor   Alternate Level Stairs-Number of Steps 26  2 flights   Alternate Level Stairs-Rails Can reach both   [place] Other (comment)  hurri-cane   Prior Function   Level of Independence Independent;Requires assistive device for independence;Independent with transfers  used huri-cane for longer community ambulation only   Vocation Retired   [employer] retired Chief Financial Officer   Leisure used to like to hike; like to go to [employer];    Cognition   Overall Cognitive Status Impaired/Different from baseline   Attention Sustained  Per wife, sustained attention seems to be impaired   Programmer, applications Movements are Fluid and Coordinated Yes   ROM / Strength   AROM / PROM / Strength Strength   Strength   Overall Strength Deficits   Overall Strength Comments B hip extension 4/5; R hip ABD 3+/5, L hip ABD 4-/5   Transfers   Transfers Sit to Stand;Stand to Sit   Sit to Stand 5: Supervision   Stand to Sit 5: Supervision   Ambulation/Gait   Ambulation/Gait Yes   Ambulation/Gait Assistance 5: Supervision;4: Min guard   Ambulation/Gait Assistance Details R toe catch x1 requiring min A to recover balance   Ambulation Distance (Feet) 693 Feet  during 6MWT; prolonged seated rest break after 388'   Assistive device Straight cane  "hurrycane"   Gait Pattern Step-through pattern;Decreased step length - left;Decreased stance time - right;Decreased weight shift  to right;Right flexed knee in stance;Trendelenburg;Decreased trunk rotation;Trunk flexed;Poor foot clearance - right  R Trendelenburg   Ambulation Surface Level;Indoor   Gait velocity 3.13 ft/sec   6 Minute Walk- Baseline   6 Minute Walk- Baseline --   6 Minute walk- Post Test   6 Minute Walk Post Test yes  RR: 30 breaths/min   BP (mmHg) 141/80 mmHg   HR (bpm) 77   02 Sat (%  RA) 96 %   Modified Borg Scale for Dyspnea 3- Moderate shortness of breath or breathing difficulty   6 minute walk test results    Aerobic Endurance Distance Walked 693   Endurance additional comments During initial 150', pt exhibited R Trendelenburg gait pattern; during remainder of test, pt exhibited B Trendelenburg. After ambulating for 2 minutes, 12 sec (388'), pt required seated rest break until 4 minutes, 28 sec.   Balance   Balance Assessed Yes   Dynamic Standing Balance   Dynamic Standing - Balance Support No upper extremity supported   Dynamic Standing - Level of Assistance 4: Min assist   Dynamic Standing - Comments Tendency toward posterior LOB when performing dynamic standing activities without UE support                           PT Education - 01/14/15 1708    Education provided Yes   Education Details PT evaluation findings, goals, and POC.   Person(s) Educated Patient;Spouse   Methods Explanation   Comprehension Verbalized understanding          PT Short Term Goals - 01/14/15 1721    PT SHORT TERM GOAL #1   Title Pt will perform home exercises with mod I using paper handout to indicate safe HEP compliance and to maximize functional gains. Target date: 02/11/15.   Status New   PT SHORT TERM GOAL #2   Title Pt/wife will report consistent participation in home walking program (3-5 days/week at minimum) to facilitate improved endurance, activity tolerance. Target date: 02/11/15.   Status New   PT SHORT TERM GOAL #3   Title Pt will increase 6 Minute Walk distance from 693' to  750' to indicate progress towrd significant improvement in functional endurance. Target date: 02/11/15.   Status New   PT SHORT TERM GOAL #4   Title Pt will negotiate 12 stairs with 2 rails and mod I to progress toward pt ability to safely access second floor of home without wife providing supervision/assistance. Target date: 02/11/15.   Status New   PT SHORT TERM GOAL #5   Title Perform Berg Balance Scale to assess/address functional standing balance and fall risk. Target date: 02/11/15.   Status New           PT Long Term Goals - 01/14/15 1725    PT LONG TERM GOAL #1   Title Pt will increase 6 Minute Walk Test distance from 693' to 806' to indicate significant improvement in functional endurance. Target date: 03/11/15   Status New   PT LONG TERM GOAL #2   Title Pt/wife will verbalize understanding of safe fitness regimen for gym progress toward pt-stated goal of being able to safely exercise at gym. Target date: 03/11/15   Status New   PT LONG TERM GOAL #3   Title Pt will report no more than 2-point increase in within-session pain rating to indicate ability to perform functional mobility without limitation of pain. Target date: 03/11/15   Status New   PT LONG TERM GOAL #4   Title Pt will increase Berg Balance score by 8 points from baseline to indicate significant improvement in functional standing balance. Target date: 03/11/15   Status New   PT LONG TERM GOAL #5   Title Pt/wife will verbalize understanding of CVA warning signs to indicate understanding of situations in which to seek medical attention. Target date: 03/11/15   Status New   Additional Long Term Goals  Additional Long Term Goals Yes   PT LONG TERM GOAL #6   Title Pt will ambulate > 200' over level surfaces with mod I to indicate increased stability/independence with household mobility. Target date: 03/11/15   Status New   PT LONG TERM GOAL #7   Title Pt will ambulate > 300' over unlevel, paved surfaces with mod I using LRAF  to indicate increased safety/independence with community mobility, return to PLOF. Target date: 03/11/15   Status New   PT LONG TERM GOAL #8   Title Pt will negotiate standard ramp and curb with mod I using LRAD to indicate increased safety/independence negotiating community obstacles, return to PLOF. Target date: 03/11/15   Status New   PT LONG TERM GOAL  #9   TITLE Pt will negotiate 26 stairs with 2 rails requiring single standing rest break (< 1 minute duration) with mod I, increased time to enable pt to safely access second floor of home. Target date: 03/11/15   Status New               Plan - Jan 27, 2015 1709    Clinical Impression Statement Pt is an 81 y/o M presenting to outpatient PT to address functional impairments associated with CVA sustained on 12/30/14. Prior to CVA, pt ambulated household distances independently and utilized single point cane for prolonged community ambulation. PT evaluation reveals the following functional impairments: generalized weakness in B hips (R > L); gait abnormality; significant impairment in endurance/functional activity tolerance, as indicated by 6 Minute Walk Test distance; impaired attention and awareness; decreased standing balance. Pt will benefit from skilled outpatient PT 2x/week for 8 weeks to address said impairments.   Pt will benefit from skilled therapeutic intervention in order to improve on the following deficits Abnormal gait;Decreased endurance;Decreased activity tolerance;Decreased balance;Decreased cognition;Decreased mobility;Decreased strength;Decreased safety awareness;Decreased knowledge of use of DME   Rehab Potential Good   PT Frequency 2x / week   PT Duration 8 weeks   PT Treatment/Interventions ADLs/Self Care Home Management;Functional mobility training;Stair training;Gait training;DME Instruction;Therapeutic activities;Therapeutic exercise;Balance training;Neuromuscular re-education;Patient/family education;Orthotic  Fit/Training;Vestibular;Manual techniques   PT Next Visit Plan Perform Berg. Initiate walking program and HEP.   Recommended Other Services SLP evaluation for cognitive remediation   Consulted and Agree with Plan of Care Patient;Family member/caregiver   Family Member Consulted wife, Karmen Stabs - 2015/01/27 1742    Functional Assessment Tool Used 6 Minute Walk distance: 693'   Functional Limitation Mobility: Walking and moving around   Mobility: Walking and Moving Around Current Status [phone]) At least 40 percent but less than 60 percent impaired, limited or restricted   Mobility: Walking and Moving Around Goal Status [phone]) At least 20 percent but less than 40 percent impaired, limited or restricted       Problem List Patient Active Problem List   Diagnosis Date Noted  . 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 CVA (cerebral infarction) 12/31/2014  . Right hand weakness 12/31/2014  . TIA (transient ischemic attack) 12/31/2014  . Advanced directives, counseling/discussion 04/19/2014  . Spinal stenosis, lumbar region, with neurogenic claudication 12/23/2012  . Osteoarthritis, multiple sites 10/08/2012  . Routine general medical examination at a health care facility 02/25/2012  . NSTEMI (non-ST elevated myocardial infarction) 12/28/2011  . Chronic diastolic HF (heart failure) 12/28/2011    Class: Acute  . Frequent falls 12/28/2011  . Allergic rhinitis 11/11/2008  . Hyperlipemia 04/14/2008  . Anemia 04/14/2008  . Episodic mood disorder  04/14/2008  . Essential hypertension 04/14/2008  . Coronary atherosclerosis 04/14/2008  . Atrial fibrillation 04/14/2008  . COPD (chronic obstructive pulmonary disease) with emphysema 04/14/2008    Billie Ruddy, PT, DPT [facility] [address] [place], [place], [address] Phone: [phone]   Fax:  [phone] 01/14/2015, 5:49 PM

## 2015-01-14 NOTE — Telephone Encounter (Signed)
Please put order in Epic

## 2015-01-14 NOTE — Therapy (Signed)
[facility] [address] [place], [place], [address] Phone: [phone]   Fax:  [phone]  Occupational Therapy Evaluation  Patient Details  Name: Eduardo [name] MRN: [id number] Date of Birth: 12/27/1930 Referring Provider:  Caren Griffins, MD  Encounter Date: 01/14/2015      OT End of Session - 01/14/15 1517    Visit Number 1  will need G code every 10 visits and d/c   Number of Visits 16   Date for OT Re-Evaluation 03/11/15   Authorization Type [employer] - assume medicare managed plan and will need G code!!!   Authorization Time Period 01/14/2015-03/11/2015   OT Start Time 1400   OT Stop Time 1444   OT Time Calculation (min) 44 min   Activity Tolerance Patient tolerated treatment well      Past Medical History  Diagnosis Date  . Anemia   . Anxiety   . Atrial fibrillation   . CAD (coronary artery disease)   . Hyperlipidemia   . COPD (chronic obstructive pulmonary disease)   . Allergy   . Osteoarthritis   . Hx of colonic polyps   . Hyperplastic polyp of intestine 2001  . Adenomatous polyp 2001  . Hypertension 12/25/11    pt denies this history  . Myocardial infarction 06/2010    "twice; 1 day apart; during/after cath"  . Exertional dyspnea   . S/P appy   . Arrhythmia     Past Surgical History  Procedure Laterality Date  . Appendectomy    . Carpal tunnel release      left  . Mastoidectomy      x3 on right  . Pilonidal cyst / sinus excision    . Knee arthroscopy      left, right knee 10/2009  . Shoulder arthroscopy      right  . Foot fracture surgery      right heel repair  . Nasal septum surgery      nasal septal repair  . Penile prosthesis implant      x 2--got infection after 2nd and had to remove  . Cataract extraction w/ intraocular lens implant  2/13    Dr [name]  . Carpal tunnel release  10/11/11    Dr Rush Barer  . Tonsillectomy and adenoidectomy    .  Fracture surgery    . Cardiac catheterization  2012    50% LAD and luminal irreg in RCA, 1/12  Post cath MI from damage  . Penile prosthesis  removal      There were no vitals filed for this visit.  Visit Diagnosis:  Hemiplegia affecting right dominant side - Plan: Ot plan of care cert/re-cert  Muscle weakness - Plan: Ot plan of care cert/re-cert  Lack of coordination due to stroke - Plan: Ot plan of care cert/re-cert  Impaired functional mobility and activity tolerance - Plan: Ot plan of care cert/re-cert      Subjective Assessment - 01/14/15 1406    Patient is accompained by: Family member  wife and family friend   Pertinent History see epic snapshot   Currently in Pain? Yes   Pain Score 3    Pain Location Back   Pain Orientation Right;Left   Pain Descriptors / Indicators Burning   Pain Type Chronic pain   Pain Radiating Towards buttocks and posterior aspect of b thights   Pain Onset More than a month ago   Pain Frequency Intermittent   Aggravating Factors  prolonged standing  and walking   Pain Relieving Factors sittng down   Effect of Pain on Daily Activities very limited in activity tolerance           [facility] OT Assessment - 01/14/15 1408    Assessment   Diagnosis CVA   Onset Date 12/30/14   Prior Therapy Acute OT only    Precautions   Precautions None   Restrictions   Weight Bearing Restrictions No   Balance Screen   Has the patient fallen in the past 6 months No   Has the patient had a decrease in activity level because of a fear of falling?  No   Is the patient reluctant to leave their home because of a fear of falling?  No   Home  Environment   Family/patient expects to be discharged to: Private residence   Living Arrangements Spouse/significant other   Available Help at Discharge Available 24 hours/day   [place] Two level   Bathroom Shower/Tub Walk-in Shower   Additional Comments Pt has grab bar and shower seat in shower,  raised toilet seat   Prior Function   Level of [place] Retired   ADL   Eating/Feeding Minimal assistance   Grooming Modified independent   Upper Body Bathing Modified independent  difficult to use R hand uses L hand more   Lower Body Bathing Modified independent   Upper Body Dressing Independent   Lower Body Dressing Modified independent  more difficult but able to complete   Toilet Tranfer Modified independent   Toileting - Clothing Manipulation Modified independent   [place] Transfer Modified independent   IADL   Shopping --  pt has not shopped yet would need assistance   Light Housekeeping Needs help with all home maintenance tasks   Meal Prep --  [place] own vehicle   Medication Management Is responsible for taking medication in correct dosages at correct time   Physiological scientist financial matters independently (budgets, writes checks, pays rent, bills goes to bank), collects and keeps track of income  with wife as he was previously doing   Public librarian Status Needs assist  supervision/ limited distance walking   Written Expression   Dominant Hand Right   Handwriting 75% legible  with printing name only   Vision - History   Baseline Vision Wears glasses only for reading   Additional Comments Pt states no visual changes since the stroke   Activity Tolerance   Activity Tolerance Tolerates 10-20 min activity with muiltiple rests   Cognition   Attention Selective;Alternating   Selective Attention Impaired   Selective Attention Impairment Verbal complex;Functional complex   Alternating Attention Impaired   Alternating Attention Impairment Verbal basic;Verbal complex;Functional basic;Functional complex   Memory Impaired  to be further assessed   Problem Solving Impaired   Problem Solving Impairment Functional complex;Verbal complex   Executive Function --  to be  further assessed   Sensation   Light Touch Appears Intact   Hot/Cold Appears Intact   Proprioception Appears Intact   Coordination   Gross Motor Movements are Fluid and Coordinated Yes   9 Hole Peg Test Right;Left   Right 9 Hole Peg Test 53.43   Left 9 Hole Peg Test 27.71   Tone   Assessment Location Right Upper Extremity   ROM / Strength   AROM / PROM / Strength AROM;Strength   AROM  Overall AROM  Within functional limits for tasks performed   Strength   Overall Strength Within functional limits for tasks performed   Overall Strength Comments Strength shoulder to wrist WFL's however weaker in R hand   Hand Function   Right Hand Gross Grasp Impaired   Right Hand Grip (lbs) 45 pounds   Right Hand Lateral Pinch 14 lbs   Right Hand 3 Point Pinch 8 lbs  2 pt = 9   Left Hand Gross Grasp Functional   Left Hand Grip (lbs) 55   Left Hand Lateral Pinch 15 lbs   Left 3 point pinch 12 lbs  11   RUE Tone   RUE Tone Within Functional Limits                           OT Short Term Goals - 01/14/15 1503    OT SHORT TERM GOAL #1   Title Pt will be mod I with HEP - 02/11/2115   Status New   OT SHORT TERM GOAL #2   Title Pt will demonstrate improved grip strenth in R hand by at least 5 pounds (baseline=45 pounds) to assist with functional tasks   Status New   OT SHORT TERM GOAL #3   Title Pt will demonstrate improved pinch strength R hand by at least 2 pounds combined (baseline = 31) to assist with functional tasks   Status New   OT SHORT TERM GOAL #4   Title Pt will be mod I with cutting meat, AE prn   Status New   OT SHORT TERM GOAL #5   Title Pt will demonstrate mod I with selective attention for basic familiar functional tasks   Status New   Additional Short Term Goals   Additional Short Term Goals Yes   OT SHORT TERM GOAL #6   Title Pt will complete at least 10 minutes of functional activity without rest breaks   Status New           OT Long Term  Goals - 01/14/15 1506    OT LONG TERM GOAL #1   Title Pt will be mod I with upgraded HEP- 03/11/2015   Status New   OT LONG TERM GOAL #2   Title Pt will demonstrate mod I with alternating attention with basic familiar functional tasks   Status New   OT LONG TERM GOAL #3   Title Pt will demonstrate improved grip strength in R hand by at least 8 pounds (baseline= 45) to assist with functional tasks   Status New   OT LONG TERM GOAL #4   Title Pt will be mod I with simple home mgmt tasks   Status New   OT LONG TERM GOAL #5   Title Pt will be able to complete 20 minutes of activity without rest break   Status New   Long Term Additional Goals   Additional Long Term Goals Yes   OT LONG TERM GOAL #6   Title Pt will demonstrate improved coordination as evidenced by decreasing time on 9 hole peg test by at least 10 seconds (baseline = 53.43) to assist with functional tasks   Status New               Plan - 01/14/15 1512    Clinical Impression Statement Pt is an 81 year old male s/p L CVA on 12/30/2014. Pt presents to the outpatient center today with the following deficits that impair his  ability to complete ADL and IADL activities:  decreased balance, decreased activity tolerance, decreased grip strength, decreased coordination, decreased pinch strength, decreased functional use of R hand, impared cognition. Pt will benefit from skilled OT to address these deficits to maximize independent functioning. Also feel pt would benefit from skilled ST to evaluate and address cognitive - linguistic skills.   Pt will benefit from skilled therapeutic intervention in order to improve on the following deficits (Retired) Decreased activity tolerance;Decreased balance;Decreased cognition;Decreased mobility;Decreased endurance;Decreased coordination;Decreased safety awareness;Decreased strength;Impaired UE functional use   Rehab Potential Good   OT Frequency 2x / week   OT Duration 8 weeks   OT  Treatment/Interventions Self-care/ADL training;Fluidtherapy;Therapeutic exercise;Neuromuscular education;Manual Therapy;Functional Mobility Training;DME and/or AE instruction;Cognitive remediation/compensation;Therapeutic activities;Patient/family education;Balance training   Plan review goals and tx plan, initiate HEP   Consulted and Agree with Plan of Care Patient;Family member/caregiver   Family Member Consulted wife          G-Codes - 02-06-15 1523    Functional Assessment Tool Used 9 hole peg, grip strength, clinical observation   Functional Limitation Carrying, moving and handling objects   Carrying, Moving and Handling Objects Current Status [phone]) At least 60 percent but less than 80 percent impaired, limited or restricted   Carrying, Moving and Handling Objects Goal Status [id number]) At least 20 percent but less than 40 percent impaired, limited or restricted      Problem List Patient Active Problem List   Diagnosis Date Noted  . 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 CVA (cerebral infarction) 12/31/2014  . Right hand weakness 12/31/2014  . TIA (transient ischemic attack) 12/31/2014  . Advanced directives, counseling/discussion 04/19/2014  . Spinal stenosis, lumbar region, with neurogenic claudication 12/23/2012  . Osteoarthritis, multiple sites 10/08/2012  . Routine general medical examination at a health care facility 02/25/2012  . NSTEMI (non-ST elevated myocardial infarction) 12/28/2011  . Chronic diastolic HF (heart failure) 12/28/2011    Class: Acute  . Frequent falls 12/28/2011  . Allergic rhinitis 11/11/2008  . Hyperlipemia 04/14/2008  . Anemia 04/14/2008  . Episodic mood disorder 04/14/2008  . Essential hypertension 04/14/2008  . Coronary atherosclerosis 04/14/2008  . Atrial fibrillation 04/14/2008  . COPD (chronic obstructive pulmonary disease) with emphysema 04/14/2008    Quay Burow, OTR/L 02/06/2015, 3:26 PM  [facility] [address] [place], [place], [address] Phone: [phone]   Fax:  [phone]

## 2015-01-14 NOTE — Telephone Encounter (Signed)
Angie from [facility] outpatient [facility] called. Need urgent order for outpatient PT and OT dated today 01/14/15. Pt is there at rehab now. Can you please order asap as pt is there now?   Jimmy Footman [phone] Fx: [phone]

## 2015-01-17 ENCOUNTER — Other Ambulatory Visit (HOSPITAL_COMMUNITY): Payer: Self-pay | Admitting: Dermatology

## 2015-01-17 ENCOUNTER — Ambulatory Visit (HOSPITAL_COMMUNITY)
Admission: RE | Admit: 2015-01-17 | Discharge: 2015-01-17 | Disposition: A | Discharge status: Home / Self Care | Payer: PPO | Source: Ambulatory Visit | Attending: Vascular Surgery | Admitting: Vascular Surgery

## 2015-01-17 ENCOUNTER — Ambulatory Visit: Payer: PPO

## 2015-01-17 ENCOUNTER — Telehealth: Payer: Self-pay | Admitting: Internal Medicine

## 2015-01-17 DIAGNOSIS — M7989 Other specified soft tissue disorders: Secondary | ICD-10-CM | POA: Insufficient documentation

## 2015-01-17 DIAGNOSIS — M79604 Pain in right leg: Secondary | ICD-10-CM

## 2015-01-17 DIAGNOSIS — I639 Cerebral infarction, unspecified: Secondary | ICD-10-CM

## 2015-01-17 NOTE — Progress Notes (Signed)
Preliminary results phoned and given to  [facility] Dr. Doug Sou nurse

## 2015-01-17 NOTE — Telephone Encounter (Signed)
[facility] is requesting a referral for speech therapy  Call back [phone]

## 2015-01-17 NOTE — Telephone Encounter (Signed)
Referral placed.

## 2015-01-18 ENCOUNTER — Encounter: Payer: Self-pay | Admitting: Internal Medicine

## 2015-01-18 ENCOUNTER — Ambulatory Visit: Payer: PPO | Admitting: Physical Therapy

## 2015-01-18 ENCOUNTER — Ambulatory Visit: Payer: PPO | Admitting: Occupational Therapy

## 2015-01-18 ENCOUNTER — Telehealth: Payer: Self-pay

## 2015-01-18 DIAGNOSIS — G8191 Hemiplegia, unspecified affecting right dominant side: Secondary | ICD-10-CM | POA: Diagnosis not present

## 2015-01-18 DIAGNOSIS — Z7409 Other reduced mobility: Secondary | ICD-10-CM

## 2015-01-18 DIAGNOSIS — R269 Unspecified abnormalities of gait and mobility: Secondary | ICD-10-CM

## 2015-01-18 DIAGNOSIS — R2681 Unsteadiness on feet: Secondary | ICD-10-CM

## 2015-01-18 DIAGNOSIS — IMO0002 Reserved for concepts with insufficient information to code with codable children: Secondary | ICD-10-CM

## 2015-01-18 DIAGNOSIS — R531 Weakness: Secondary | ICD-10-CM

## 2015-01-18 NOTE — Patient Instructions (Addendum)
  Coordination Activities  Perform the following activities for 20-30 minutes 2 times per day with right hand(s).   Rotate ball in fingertips (clockwise and counter-clockwise).  Toss ball in air and catch with the same hand.  Flip cards 1 at a time as fast as you can.  Deal cards with your thumb (Hold deck in hand and push card off top with thumb).  Rotate card in hand (clockwise and counter-clockwise).  Pick up coins, buttons, marbles, dried beans/pasta of different sizes and place in container.  Pick up coins and stack.  Pick up coins one at a time until you get 5-10 in your hand, then move coins from palm to fingertips to stack one at a time.  Lift fingers and thumb off table one at a time. Increase speed as able.   Touch thumb to each fingertip. Increase speed as able.   Practice writing skills, dot to dot, puzzles, etc.  Roll out red putty and pinch with each finger and thumb.  Can do ring/little fingers together if you need to.    Squeeze putty with your whole hand  20x.  Loop putty around fingers (doughnut) and put fingers and thumb in hole.  Then spread your fingers apart

## 2015-01-18 NOTE — Telephone Encounter (Signed)
I spoke with pt and it looks like he is getting separate therapy for another therapist, pt is getting OT and PT, both Dr. [name] and Dr. Renne Crigler ordered therapy and now this therapist Willaim Rayas needs an other for PT for gait balance, which is what pt got today from Billie Ruddy, PT, [place], not sure what else he may need, I did advise the letter was ready for pick-up.

## 2015-01-18 NOTE — Telephone Encounter (Signed)
Mrs Ohanlon left v/m requesting a note for Willaim Rayas, PT that pt is capable to do physical therapy and Jacqlyn Larsen can treat pt. Mrs Reichelderfer request cb.

## 2015-01-18 NOTE — Telephone Encounter (Signed)
Letter written Hopefully that will work Not sure what they needs since I made the referral anyway.Marland KitchenMarland Kitchen

## 2015-01-18 NOTE — Therapy (Signed)
[facility] [address] [place], [place], [address] Phone: [phone]   Fax:  [phone]  Physical Therapy Treatment  Patient Details  Name: Eduardo [name] MRN: [id number] Date of Birth: 05/01/1931 Referring Provider:  Venia Carbon, MD  Encounter Date: 01/18/2015      PT End of Session - 01/18/15 0907    Visit Number 2   Number of Visits 17   Date for PT Re-Evaluation 03/15/15   PT Start Time 0802   PT Stop Time 0845   PT Time Calculation (min) 43 min   Equipment Utilized During Treatment Gait belt   Activity Tolerance Patient tolerated treatment well;No increased pain   Behavior During Therapy [facility] for tasks assessed/performed      Past Medical History  Diagnosis Date  . Anemia   . Anxiety   . Atrial fibrillation   . CAD (coronary artery disease)   . Hyperlipidemia   . COPD (chronic obstructive pulmonary disease)   . Allergy   . Osteoarthritis   . Hx of colonic polyps   . Hyperplastic polyp of intestine 2001  . Adenomatous polyp 2001  . Hypertension 12/25/11    pt denies this history  . Myocardial infarction 06/2010    "twice; 1 day apart; during/after cath"  . Exertional dyspnea   . S/P appy   . Arrhythmia     Past Surgical History  Procedure Laterality Date  . Appendectomy    . Carpal tunnel release      left  . Mastoidectomy      x3 on right  . Pilonidal cyst / sinus excision    . Knee arthroscopy      left, right knee 10/2009  . Shoulder arthroscopy      right  . Foot fracture surgery      right heel repair  . Nasal septum surgery      nasal septal repair  . Penile prosthesis implant      x 2--got infection after 2nd and had to remove  . Cataract extraction w/ intraocular lens implant  2/13    Dr [name]  . Carpal tunnel release  10/11/11    Dr Rush Barer  . Tonsillectomy and adenoidectomy    . Fracture surgery    . Cardiac catheterization  2012    50% LAD and  luminal irreg in RCA, 1/12  Post cath MI from damage  . Penile prosthesis  removal      There were no vitals filed for this visit.  Visit Diagnosis:  Abnormality of gait  Unsteadiness  Weakness generalized  Hemiplegia affecting right dominant side      Subjective Assessment - 01/18/15 0805    Subjective Pt reporting having seen dermatologist yesterday. Dermatologist looked at L lower leg and recommended pt see vein specialist immediately due to suspected blood clot. However, vein specialist did not think pt had clot but did recommend an MRI this Thursday. Pt denies falls but reports noticing increased gait instability/imbalance with "turning too quickly" and walking over unlevel outdoor surfaces.   Pertinent History Chronic atrial fibrillation, combined systolic and diastolic heart failure, CAD, HTN   Limitations House hold activities;Walking   Patient Stated Goals "Improve my endurance, get better balance, walk like I used to, and get back to my regimen."   Currently in Pain? No/denies            [facility] PT Assessment - 01/18/15 0001    Standardized Balance Assessment   Standardized  Balance Assessment Berg Balance Test   Berg Balance Test   Sit to Stand Able to stand without using hands and stabilize independently   Standing Unsupported Able to stand safely 2 minutes   Sitting with Back Unsupported but Feet Supported on Floor or Stool Able to sit safely and securely 2 minutes   Stand to Sit Sits safely with minimal use of hands   Transfers Able to transfer safely, minor use of hands   Standing Unsupported with Eyes Closed Able to stand 10 seconds with supervision  (S) due to postural sway   Standing Ubsupported with Feet Together Able to place feet together independently and stand for 1 minute with supervision  lateral trunk lean to L side but no LOB   From Standing, Reach Forward with Outstretched Arm Can reach forward >12 cm safely (5")  8" prior to taking step   From  Standing Position, Pick up Object from Floor Able to pick up shoe, needs supervision   From Standing Position, Turn to Look Behind Over each Shoulder Needs supervision when turning   Turn 360 Degrees Needs close supervision or verbal cueing   Standing Unsupported, Alternately Place Feet on Step/Stool Able to complete >2 steps/needs minimal assist  8 steps with min guard due to near LOB to R side   Standing Unsupported, One Foot in Front Able to plae foot ahead of the other independently and hold 30 seconds   Standing on One Leg Tries to lift leg/unable to hold 3 seconds but remains standing independently   Total Score 39                     [facility] Adult PT Treatment/Exercise - 01/18/15 0001    Transfers   Transfers Sit to Stand;Stand to Sit   Sit to Stand 6: Modified independent (Device/Increase time)   Stand to Sit 6: Modified independent (Device/Increase time)   Ambulation/Gait   Ambulation/Gait Yes   Ambulation/Gait Assistance 5: Supervision;4: Min guard   Ambulation Distance (Feet) 250 Feet   Assistive device Straight cane  "hurrycane"   Gait Pattern Step-through pattern;Decreased step length - left;Decreased stance time - right;Decreased weight shift to right;Right flexed knee in stance;Trendelenburg;Decreased trunk rotation;Trunk flexed;Poor foot clearance - right  R Trendelenburg   Gait velocity --   Neuro Re-ed    Neuro Re-ed Details  Completed Berg Balance Scale with score of 39/56. See Merrilee Jansky for detailed findings. Instructed pt in single limb stance home exercise in corner with verbal, demonstration cueing for technique.   Exercises   Exercises Other Exercises   Other Exercises  Pt performed B clamshells 2 x20 reps per side with cueing for hip alignment, safe/proper technique. Also performed supine bridging x10 2 x20 reps with cueing for technique. During both exercises, freuent cueing for slow, quality movement required.                PT Education -  01/18/15 [phone]    Education provided Yes   Education Details Merrilee Jansky findings, functional implications, and fall risk. HEP; walking program   Person(s) Educated Patient   Methods Explanation;Demonstration;Verbal cues;Handout;Tactile cues   Comprehension Verbalized understanding;Returned demonstration;Need further instruction  Will likely need reinforcement          PT Short Term Goals - 01/18/15 0910    PT SHORT TERM GOAL #1   Title Pt will perform home exercises with mod I using paper handout to indicate safe HEP compliance and to maximize functional gains. Target date:  02/11/15.   Baseline Initiated on 8/9.   Status On-going   PT SHORT TERM GOAL #2   Title Pt/wife will report consistent participation in home walking program (3-5 days/week at minimum) to facilitate improved endurance, activity tolerance. Target date: 02/11/15.   Baseline Initiated on 8/9.   Status On-going   PT SHORT TERM GOAL #3   Title Pt will increase 6 Minute Walk distance from 693' to 750' to indicate progress towrd significant improvement in functional endurance. Target date: 02/11/15.   Status On-going   PT SHORT TERM GOAL #4   Title Pt will negotiate 12 stairs with 2 rails and mod I to progress toward pt ability to safely access second floor of home without wife providing supervision/assistance. Target date: 02/11/15.   Status New   PT SHORT TERM GOAL #5   Title Perform Berg Balance Scale to assess/address functional standing balance and fall risk. Target date: 02/11/15.   Baseline Scored 39/56 on 8/9.   Status Achieved           PT Long Term Goals - 01/18/15 0911    PT LONG TERM GOAL #1   Title Pt will increase 6 Minute Walk Test distance from 693' to 806' to indicate significant improvement in functional endurance. Target date: 03/11/15   Status On-going   PT LONG TERM GOAL #2   Title Pt/wife will verbalize understanding of safe fitness regimen for gym progress toward pt-stated goal of being able to safely  exercise at gym. Target date: 03/11/15   Status On-going   PT LONG TERM GOAL #3   Title Pt will report no more than 2-point increase in within-session pain rating to indicate ability to perform functional mobility without limitation of pain. Target date: 03/11/15   Status On-going   PT LONG TERM GOAL #4   Title Pt will increase Berg Balance score by 8 points from baseline to indicate significant improvement in functional standing balance. Target date: 03/11/15   Baseline 8/9: baseline Berg score = 39/56   Status On-going   PT LONG TERM GOAL #5   Title Pt/wife will verbalize understanding of CVA warning signs to indicate understanding of situations in which to seek medical attention. Target date: 03/11/15   Status On-going   PT LONG TERM GOAL #6   Title Pt will ambulate > 200' over level surfaces with mod I to indicate increased stability/independence with household mobility. Target date: 03/11/15   Status On-going   PT LONG TERM GOAL #7   Title Pt will ambulate > 300' over unlevel, paved surfaces with mod I using LRAF to indicate increased safety/independence with community mobility, return to PLOF. Target date: 03/11/15   Status On-going   PT LONG TERM GOAL #8   Title Pt will negotiate standard ramp and curb with mod I using LRAD to indicate increased safety/independence negotiating community obstacles, return to PLOF. Target date: 03/11/15   Status On-going   PT LONG TERM GOAL  #9   TITLE Pt will negotiate 26 stairs with 2 rails requiring single standing rest break (< 1 minute duration) with mod I, increased time to enable pt to safely access second floor of home. Target date: 03/11/15   Status On-going               Plan - 01/18/15 0908    Clinical Impression Statement Session focused on completion of Berg, initiation of walking program and HEP for strengthening and balance. Pt reports and exhibits increased gait instability with turning, ambulating  over unlevel surfaces. Will plan on  performing SOT on Balance Master to guide treatment. Continue per POC.   Pt will benefit from skilled therapeutic intervention in order to improve on the following deficits Abnormal gait;Decreased endurance;Decreased activity tolerance;Decreased balance;Decreased cognition;Decreased mobility;Decreased strength;Decreased safety awareness;Decreased knowledge of use of DME   Rehab Potential Good   PT Frequency 2x / week   PT Duration 8 weeks   PT Treatment/Interventions ADLs/Self Care Home Management;Functional mobility training;Stair training;Gait training;DME Instruction;Therapeutic activities;Therapeutic exercise;Balance training;Neuromuscular re-education;Patient/family education;Orthotic Fit/Training;Vestibular;Manual techniques   PT Next Visit Plan Perform SOT on Balance Master. Check safety with HEP. Ask about walking program. Address CVA risk factors and warning signs.   Consulted and Agree with Plan of Care Patient        Problem List Patient Active Problem List   Diagnosis Date Noted  . 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 CVA (cerebral infarction) 12/31/2014  . Right hand weakness 12/31/2014  . TIA (transient ischemic attack) 12/31/2014  . Advanced directives, counseling/discussion 04/19/2014  . Spinal stenosis, lumbar region, with neurogenic claudication 12/23/2012  . Osteoarthritis, multiple sites 10/08/2012  . Routine general medical examination at a health care facility 02/25/2012  . NSTEMI (non-ST elevated myocardial infarction) 12/28/2011  . Chronic diastolic HF (heart failure) 12/28/2011    Class: Acute  . Frequent falls 12/28/2011  . Allergic rhinitis 11/11/2008  . Hyperlipemia 04/14/2008  . Anemia 04/14/2008  . Episodic mood disorder 04/14/2008  . Essential hypertension 04/14/2008  . Coronary atherosclerosis 04/14/2008  . Atrial fibrillation 04/14/2008  . COPD (chronic obstructive pulmonary disease) with emphysema 04/14/2008    Billie Ruddy, PT, DPT [facility] [address] [place], [place], [address] Phone: [phone]   Fax:  [phone] 01/18/2015, 9:13 AM

## 2015-01-18 NOTE — Patient Instructions (Addendum)
SINGLE LIMB STANCE   Stance: single leg on floor. Raise leg. Try to hold for _10__ seconds. Repeat with other leg. Do this 3 times per day, _5__ days per week   Abduction: Clam (Eccentric) - Side-Lying   Lie on side with knees bent. Lift top knee, keeping feet together. Keep trunk steady. SLOWLY lower for 3-5 seconds. You should feel this in your "back pocket muscle" (like we talked about during PT). Perform 20 reps, 3 times per day on each side.   Bridging   Lie down with both knees bent, both feet positioned flat on bed. Slowly raise buttocks from floor, keeping stomach tight. Hold for 2 seconds, then SLOWLY lower your hips back to resting position. Perform 20 reps, 3 times per day.  Walking Program:  Begin walking for exercise for 3 minutes, 2 times/day, 3 days/week. Don't worry about how quickly you walk; go as slowly as you need to in order to walk 3 consecutive minutes. Progress your walking program by adding 1 minute to your routine each week, as tolerated. Be sure to wear good walking shoes, walk in a safe environment (at the gym, as we discussed), use your cane, and only progress to your tolerance.

## 2015-01-18 NOTE — Therapy (Signed)
[facility] [address] [place], [place], [address] Phone: [phone]   Fax:  [phone]  Occupational Therapy Treatment  Patient Details  Name: Eduardo [name] MRN: [id number] Date of Birth: 1931/03/25 Referring Provider:  Venia Carbon, MD  Encounter Date: 01/18/2015      OT End of Session - 01/18/15 0940    Visit Number 2  will need G code every 10 visits and d/c   Number of Visits 16   Date for OT Re-Evaluation 03/11/15   Authorization Type [employer] - assume medicare managed plan and will need G code!!!   Authorization Time Period 01/14/2015-03/11/2015   Authorization - Visit Number 2   Authorization - Number of Visits 10   OT Start Time [phone]   OT Stop Time 0930   OT Time Calculation (min) 41 min   Activity Tolerance Patient tolerated treatment well   Behavior During Therapy Impulsive  with frustration      Past Medical History  Diagnosis Date  . Anemia   . Anxiety   . Atrial fibrillation   . CAD (coronary artery disease)   . Hyperlipidemia   . COPD (chronic obstructive pulmonary disease)   . Allergy   . Osteoarthritis   . Hx of colonic polyps   . Hyperplastic polyp of intestine 2001  . Adenomatous polyp 2001  . Hypertension 12/25/11    pt denies this history  . Myocardial infarction 06/2010    "twice; 1 day apart; during/after cath"  . Exertional dyspnea   . S/P appy   . Arrhythmia     Past Surgical History  Procedure Laterality Date  . Appendectomy    . Carpal tunnel release      left  . Mastoidectomy      x3 on right  . Pilonidal cyst / sinus excision    . Knee arthroscopy      left, right knee 10/2009  . Shoulder arthroscopy      right  . Foot fracture surgery      right heel repair  . Nasal septum surgery      nasal septal repair  . Penile prosthesis implant      x 2--got infection after 2nd and had to remove  . Cataract extraction w/ intraocular lens implant   2/13    Dr [name]  . Carpal tunnel release  10/11/11    Dr Rush Barer  . Tonsillectomy and adenoidectomy    . Fracture surgery    . Cardiac catheterization  2012    50% LAD and luminal irreg in RCA, 1/12  Post cath MI from damage  . Penile prosthesis  removal      There were no vitals filed for this visit.  Visit Diagnosis:  Hemiplegia affecting right dominant side  Lack of coordination due to stroke  Impaired functional mobility and activity tolerance      Subjective Assessment - 01/18/15 0850    Subjective  Pt reports that he has swelling in RLE, but had it checked for DVT and it was NOT a DVT   Pertinent History see epic snapshot   Currently in Pain? No/denies                      OT Treatments/Exercises (OP) - 01/18/15 0935    ADLs   Overall ADLs Reviewed POC/goals with pt.  Pt reports that he has had difficulty with extending R thumb (at MP) since injury using wrench (prior  to CVA) and that he has had drooling for 3-4 months prior to CVA  and that MD is aware.   Fine Motor Coordination   Other Fine Motor Exercises attempted ball push-ups and finger ext with ball on table, but pt with max difficulty.  Coordination affected by frustration and attention.  Pt encouraged to slow down and try not to get frustrated with difficulty.                OT Education - 01/18/15 0900    Education provided Yes   Education Details coordination, putty HEP (red)   Person(s) Educated Patient   Methods Explanation;Verbal cues;Handout;Tactile cues   Comprehension Verbalized understanding;Returned demonstration          OT Short Term Goals - 01/14/15 1503    OT SHORT TERM GOAL #1   Title Pt will be mod I with HEP - 02/11/2115   Status New   OT SHORT TERM GOAL #2   Title Pt will demonstrate improved grip strenth in R hand by at least 5 pounds (baseline=45 pounds) to assist with functional tasks   Status New   OT SHORT TERM GOAL #3   Title Pt will  demonstrate improved pinch strength R hand by at least 2 pounds combined (baseline = 31) to assist with functional tasks   Status New   OT SHORT TERM GOAL #4   Title Pt will be mod I with cutting meat, AE prn   Status New   OT SHORT TERM GOAL #5   Title Pt will demonstrate mod I with selective attention for basic familiar functional tasks   Status New   Additional Short Term Goals   Additional Short Term Goals Yes   OT SHORT TERM GOAL #6   Title Pt will complete at least 10 minutes of functional activity without rest breaks   Status New           OT Long Term Goals - 01/14/15 1506    OT LONG TERM GOAL #1   Title Pt will be mod I with upgraded HEP- 03/11/2015   Status New   OT LONG TERM GOAL #2   Title Pt will demonstrate mod I with alternating attention with basic familiar functional tasks   Status New   OT LONG TERM GOAL #3   Title Pt will demonstrate improved grip strength in R hand by at least 8 pounds (baseline= 45) to assist with functional tasks   Status New   OT LONG TERM GOAL #4   Title Pt will be mod I with simple home mgmt tasks   Status New   OT LONG TERM GOAL #5   Title Pt will be able to complete 20 minutes of activity without rest break   Status New   Long Term Additional Goals   Additional Long Term Goals Yes   OT LONG TERM GOAL #6   Title Pt will demonstrate improved coordination as evidenced by decreasing time on 9 hole peg test by at least 10 seconds (baseline = 53.43) to assist with functional tasks   Status New               Plan - 01/18/15 0947    Clinical Impression Statement Pt instructed in initial HEP and verbalized understanding.  Pt needs cueing to slow down for coordination tasks due to incr speed with frustration which leads to incr difficulty.   Plan review HEP prn, coordination, activity tolerance   Consulted and Agree with Plan of  Care Patient        Problem List Patient Active Problem List   Diagnosis Date Noted  .  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 CVA (cerebral infarction) 12/31/2014  . Right hand weakness 12/31/2014  . TIA (transient ischemic attack) 12/31/2014  . Advanced directives, counseling/discussion 04/19/2014  . Spinal stenosis, lumbar region, with neurogenic claudication 12/23/2012  . Osteoarthritis, multiple sites 10/08/2012  . Routine general medical examination at a health care facility 02/25/2012  . NSTEMI (non-ST elevated myocardial infarction) 12/28/2011  . Chronic diastolic HF (heart failure) 12/28/2011    Class: Acute  . Frequent falls 12/28/2011  . Allergic rhinitis 11/11/2008  . Hyperlipemia 04/14/2008  . Anemia 04/14/2008  . Episodic mood disorder 04/14/2008  . Essential hypertension 04/14/2008  . Coronary atherosclerosis 04/14/2008  . Atrial fibrillation 04/14/2008  . COPD (chronic obstructive pulmonary disease) with emphysema 04/14/2008    [facility] 01/18/2015, 9:54 AM  [facility] [address] [place], [place], [address] Phone: [phone]   Fax:  [place], OTR/L 01/18/2015 9:54 AM

## 2015-01-19 NOTE — Telephone Encounter (Signed)
.  left message to have patient return my call.  

## 2015-01-19 NOTE — Telephone Encounter (Signed)
Please call Eduardo [name] and let her know it is okay to do a personally designed exercise routine for him---in fact, it is what I have recommended.  I hope I don't have to do yet another letter.

## 2015-01-19 NOTE — Telephone Encounter (Signed)
Spoke with patient's wife and advised results, she will discuss with physical therapy at [facility]

## 2015-01-19 NOTE — Telephone Encounter (Signed)
Called patients wife Eduardo [name]. What the patients wife is asking you for is this, the patient has been going to a gym for the past year working with Willaim Rayas who is a PT but works at a gym now. He works out with her twice a week. Willaim Rayas just wants to know that its okay to work with the patient in the gym, since his stroke. They pay her privately to work with him. She motivates him  and that is what they are asking you for the note, she wants to make sure that it is okay to be working out with him. Please call the wife back at home# [phone]. This is just extra time working and helping the patient be more mobile.

## 2015-01-19 NOTE — Telephone Encounter (Signed)
He can only see one therapist each for PT and OT. I cannot approve a second therapist

## 2015-01-20 ENCOUNTER — Encounter: Payer: Self-pay | Admitting: Occupational Therapy

## 2015-01-20 ENCOUNTER — Ambulatory Visit
Admission: RE | Admit: 2015-01-20 | Discharge: 2015-01-20 | Disposition: A | Discharge status: Home / Self Care | Payer: PPO | Source: Ambulatory Visit | Attending: Physical Medicine and Rehabilitation | Admitting: Physical Medicine and Rehabilitation

## 2015-01-20 ENCOUNTER — Ambulatory Visit: Payer: PPO | Admitting: Occupational Therapy

## 2015-01-20 DIAGNOSIS — M5416 Radiculopathy, lumbar region: Secondary | ICD-10-CM

## 2015-01-20 DIAGNOSIS — M1288 Other specific arthropathies, not elsewhere classified, other specified site: Secondary | ICD-10-CM | POA: Insufficient documentation

## 2015-01-20 DIAGNOSIS — M4806 Spinal stenosis, lumbar region: Secondary | ICD-10-CM | POA: Diagnosis not present

## 2015-01-20 DIAGNOSIS — M545 Low back pain: Secondary | ICD-10-CM | POA: Diagnosis present

## 2015-01-20 DIAGNOSIS — Z7409 Other reduced mobility: Secondary | ICD-10-CM

## 2015-01-20 DIAGNOSIS — G8191 Hemiplegia, unspecified affecting right dominant side: Secondary | ICD-10-CM

## 2015-01-20 DIAGNOSIS — M5126 Other intervertebral disc displacement, lumbar region: Secondary | ICD-10-CM | POA: Diagnosis not present

## 2015-01-20 DIAGNOSIS — IMO0002 Reserved for concepts with insufficient information to code with codable children: Secondary | ICD-10-CM

## 2015-01-20 NOTE — Therapy (Signed)
[facility] [address] [place], [place], [address] Phone: [phone]   Fax:  [phone]  Occupational Therapy Treatment  Patient Details  Name: Eduardo [name] MRN: [id number] Date of Birth: [birth year] Referring Provider:  Venia Carbon, MD  Encounter Date: 01/20/2015      OT End of Session - 01/20/15 1659    Visit Number 3   Number of Visits 16   Date for OT Re-Evaluation 03/11/15   Authorization Type [employer] - assume medicare managed plan and will need G code!!!   Authorization Time Period 01/14/2015-03/11/2015   OT Start Time 1616   OT Stop Time 1658   OT Time Calculation (min) 42 min   Activity Tolerance Patient tolerated treatment well      Past Medical History  Diagnosis Date  . Anemia   . Anxiety   . Atrial fibrillation   . CAD (coronary artery disease)   . Hyperlipidemia   . COPD (chronic obstructive pulmonary disease)   . Allergy   . Osteoarthritis   . Hx of colonic polyps   . Hyperplastic polyp of intestine 2001  . Adenomatous polyp 2001  . Hypertension 12/25/11    pt denies this history  . Myocardial infarction 06/2010    "twice; 1 day apart; during/after cath"  . Exertional dyspnea   . S/P appy   . Arrhythmia     Past Surgical History  Procedure Laterality Date  . Appendectomy    . Carpal tunnel release      left  . Mastoidectomy      x3 on right  . Pilonidal cyst / sinus excision    . Knee arthroscopy      left, right knee 10/2009  . Shoulder arthroscopy      right  . Foot fracture surgery      right heel repair  . Nasal septum surgery      nasal septal repair  . Penile prosthesis implant      x 2--got infection after 2nd and had to remove  . Cataract extraction w/ intraocular lens implant  2/13    Dr [name]  . Carpal tunnel release  10/11/11    Dr Rush Barer  . Tonsillectomy and adenoidectomy    . Fracture surgery    . Cardiac  catheterization  2012    50% LAD and luminal irreg in RCA, 1/12  Post cath MI from damage  . Penile prosthesis  removal      There were no vitals filed for this visit.  Visit Diagnosis:  Hemiplegia affecting right dominant side  Lack of coordination due to stroke  Impaired functional mobility and activity tolerance      Subjective Assessment - 01/20/15 1620    Subjective  I am trying to do the coordination things at home - they are hard   Pertinent History see epic snapshot   Currently in Pain? Yes  pt had MRI today   Pain Score 4    Pain Location Back   Pain Orientation Lower;Medial   Pain Type Chronic pain   Pain Radiating Towards both lower legs   Pain Onset More than a month ago  a couple years   Aggravating Factors  prolonged standing and walking   Pain Relieving Factors short distance walking,                       OT Treatments/Exercises (OP) - 01/20/15 0001    Exercises  Exercises Hand   Hand Exercises   Theraputty Flatten;Roll;Grip;Pinch;Locate Pegs  putty activities with red,20 pegs in green putty   Hand Gripper with Small Beads 35# of resistance picking up 1 inch blocks with min dropping and 4 rest breaks. Pt reports fatigue level of 3/10 however felt he needed to take break and stop activity 85% of the way through.                OT Education - 01/20/15 1641    Education provided Yes   Education Details full theraputty HEP with red putty   Person(s) Educated Patient   Methods Explanation;Demonstration;Verbal cues;Handout   Comprehension Verbalized understanding;Returned demonstration          OT Short Term Goals - 01/20/15 1643    OT SHORT TERM GOAL #1   Title Pt will be mod I with HEP - 02/11/2115   Status On-going   OT SHORT TERM GOAL #2   Title Pt will demonstrate improved grip strenth in R hand by at least 5 pounds (baseline=45 pounds) to assist with functional tasks   Status On-going   OT SHORT TERM GOAL #3   Title Pt  will demonstrate improved pinch strength R hand by at least 2 pounds combined (baseline = 31) to assist with functional tasks   Status On-going   OT SHORT TERM GOAL #4   Title Pt will be mod I with cutting meat, AE prn   Status On-going   OT SHORT TERM GOAL #5   Title Pt will demonstrate mod I with selective attention for basic familiar functional tasks   Status On-going   OT SHORT TERM GOAL #6   Title Pt will complete at least 10 minutes of functional activity without rest breaks   Status On-going           OT Long Term Goals - 01/20/15 1643    OT LONG TERM GOAL #1   Title Pt will be mod I with upgraded HEP- 03/11/2015   Status On-going   OT LONG TERM GOAL #2   Title Pt will demonstrate mod I with alternating attention with basic familiar functional tasks   Status On-going   OT LONG TERM GOAL #3   Title Pt will demonstrate improved grip strength in R hand by at least 8 pounds (baseline= 45) to assist with functional tasks   Status On-going   OT LONG TERM GOAL #4   Title Pt will be mod I with simple home mgmt tasks   Status On-going   OT LONG TERM GOAL #5   Title Pt will be able to complete 20 minutes of activity without rest break   Status On-going   OT LONG TERM GOAL #6   Title Pt will demonstrate improved coordination as evidenced by decreasing time on 9 hole peg test by at least 10 seconds (baseline = 53.43) to assist with functional tasks   Status On-going               Plan - 01/20/15 1643    Clinical Impression Statement Pt progressing toward goals. Pt has HEP for both coordination and strengthening (theraputty).    Pt will benefit from skilled therapeutic intervention in order to improve on the following deficits (Retired) Decreased activity tolerance;Decreased balance;Decreased cognition;Decreased mobility;Decreased endurance;Decreased coordination;Decreased safety awareness;Decreased strength;Impaired UE functional use   Rehab Potential Good   OT Frequency  2x / week   OT Duration 8 weeks   OT Treatment/Interventions Self-care/ADL training;Fluidtherapy;Therapeutic exercise;Neuromuscular education;Manual Therapy;Functional Mobility  Training;DME and/or AE instruction;Cognitive remediation/compensation;Therapeutic activities;Patient/family education;Balance training   Plan check HEP prn, coordination, strength activity tolerance,   Consulted and Agree with Plan of Care Patient        Problem List Patient Active Problem List   Diagnosis Date Noted  . 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 CVA (cerebral infarction) 12/31/2014  . Right hand weakness 12/31/2014  . TIA (transient ischemic attack) 12/31/2014  . Advanced directives, counseling/discussion 04/19/2014  . Spinal stenosis, lumbar region, with neurogenic claudication 12/23/2012  . Osteoarthritis, multiple sites 10/08/2012  . Routine general medical examination at a health care facility 02/25/2012  . NSTEMI (non-ST elevated myocardial infarction) 12/28/2011  . Chronic diastolic HF (heart failure) 12/28/2011    Class: Acute  . Frequent falls 12/28/2011  . Allergic rhinitis 11/11/2008  . Hyperlipemia 04/14/2008  . Anemia 04/14/2008  . Episodic mood disorder 04/14/2008  . Essential hypertension 04/14/2008  . Coronary atherosclerosis 04/14/2008  . Atrial fibrillation 04/14/2008  . COPD (chronic obstructive pulmonary disease) with emphysema 04/14/2008    Quay Burow, OTR/L 01/20/2015, 5:00 PM  [facility] [address] [place], [place], [address] Phone: [phone]   Fax:  [phone]

## 2015-01-20 NOTE — Patient Instructions (Signed)
Theraputty exercises:  Using red color putty:  Do these 1-2 times per day. STOP if you get pain and let your therapist know.  1. Make a ball     Make a pancake     Make a cone Do this sequence 3 times  2. Make a fat hot dog. Squeeze as hard as you can. Do this 5 times 3. Pull taffy.  Do this 5 times 4. Ring around the fingers.  Do this 5 times. 5. Make a snake:  2 pt pinch  3 pt pinch  Lateral pinch  Do these 2 times each

## 2015-01-21 ENCOUNTER — Encounter: Payer: Self-pay | Admitting: Interventional Cardiology

## 2015-01-21 ENCOUNTER — Ambulatory Visit (INDEPENDENT_AMBULATORY_CARE_PROVIDER_SITE_OTHER): Payer: PPO | Admitting: Interventional Cardiology

## 2015-01-21 VITALS — BP 118/56 | HR 46 | Ht 63.0 in | Wt 156.6 lb

## 2015-01-21 DIAGNOSIS — I10 Essential (primary) hypertension: Secondary | ICD-10-CM

## 2015-01-21 DIAGNOSIS — I5032 Chronic diastolic (congestive) heart failure: Secondary | ICD-10-CM

## 2015-01-21 DIAGNOSIS — I482 Chronic atrial fibrillation, unspecified: Secondary | ICD-10-CM

## 2015-01-21 DIAGNOSIS — I6310 Cerebral infarction due to embolism of unspecified precerebral artery: Secondary | ICD-10-CM

## 2015-01-21 DIAGNOSIS — I2510 Atherosclerotic heart disease of native coronary artery without angina pectoris: Secondary | ICD-10-CM | POA: Diagnosis not present

## 2015-01-21 MED ORDER — VERAPAMIL HCL ER 180 MG PO TBCR: 180.0000 mg | EXTENDED_RELEASE_TABLET | Freq: Every day | ORAL | Status: DC

## 2015-01-21 MED ORDER — APIXABAN 5 MG PO TABS: 5.0000 mg | ORAL_TABLET | Freq: Two times a day (BID) | ORAL | Status: DC

## 2015-01-21 NOTE — Telephone Encounter (Signed)
This encounter was created in error - please disregard.

## 2015-01-21 NOTE — Patient Instructions (Signed)
Called pt to schedule f/u in the [place] office, spoke with pt wife, she will have pt to call us and discuss if he wants to be seen in [place], or [place] office.

## 2015-01-21 NOTE — Patient Instructions (Signed)
Medication Instructions:  Your physician has recommended you make the following change in your medication:  REDUCE Verapamil to 180mg  daily. An Rx has been sent to your pharmacy  A refill Rx for Eliquis has been sent to your pharmacy  Labwork: None   Testing/Procedures: None   Follow-Up: Your physician recommends that you schedule a follow-up appointment in: 3-4 months with Dr.Smith   Any Other Special Instructions Will Be Listed Below (If Applicable).

## 2015-01-21 NOTE — Progress Notes (Signed)
Cardiology Office Note   Date:  01/21/2015   ID:  Eduardo [name], DOB 1930-11-11, MRN [id number]  PCP:  Eduardo Simpler, MD  Cardiologist:  Eduardo Grooms, MD   Chief Complaint  Patient presents with  . Atrial Fibrillation      History of Present Illness: Eduardo [name] is a 81 y.o. male who presents for chronic atrial fibrillation, recent embolic CVA, chronic anticoagulation therapy, chronic coronary artery disease, hypertension, and newly noted bradycardia.  Margin recently had right hand numbness and weakness. He was diagnosed as an embolic CVA. He still has some persisting but improved weakness. He denies dyspnea. He has not had angina. No episodes of syncope. There is no peripheral edema. He has not had bleeding on file a course which was recently started. He had grave concerns about anticoagulation after having a life-threatening bleed on Coumadin many years ago. So far things are going well.   Past Medical History  Diagnosis Date  . Anemia   . Anxiety   . Atrial fibrillation   . CAD (coronary artery disease)   . Hyperlipidemia   . COPD (chronic obstructive pulmonary disease)   . Allergy   . Osteoarthritis   . Hx of colonic polyps   . Hyperplastic polyp of intestine 2001  . Adenomatous polyp 2001  . Hypertension 12/25/11    pt denies this history  . Myocardial infarction 06/2010    "twice; 1 day apart; during/after cath"  . Exertional dyspnea   . S/P appy   . Arrhythmia     Past Surgical History  Procedure Laterality Date  . Appendectomy    . Carpal tunnel release      left  . Mastoidectomy      x3 on right  . Pilonidal cyst / sinus excision    . Knee arthroscopy      left, right knee 10/2009  . Shoulder arthroscopy      right  . Foot fracture surgery      right heel repair  . Nasal septum surgery      nasal septal repair  . Penile prosthesis implant      x 2--got infection after 2nd and had to remove  . Cataract extraction w/  intraocular lens implant  2/13    Dr Eduardo [name]  . Carpal tunnel release  10/11/11    Dr Eduardo [name]  . Tonsillectomy and adenoidectomy    . Fracture surgery    . Cardiac catheterization  2012    50% LAD and luminal irreg in RCA, 1/12  Post cath MI from damage  . Penile prosthesis  removal       Current Outpatient Prescriptions  Medication Sig Dispense Refill  . albuterol (PROAIR HFA) 108 (90 BASE) MCG/ACT inhaler Inhale 2 puffs into the lungs every 6 (six) hours as needed for wheezing or shortness of breath. 3 Inhaler 3  . apixaban (ELIQUIS) 5 MG TABS tablet Take 1 tablet (5 mg total) by mouth 2 (two) times daily. 60 tablet 0  . atorvastatin (LIPITOR) 10 MG tablet Take 20 mg by mouth daily.    Marland Kitchen azelastine (ASTELIN) 0.1 % nasal spray Place 2 sprays into both nostrils daily. Use in each nostril as directed    . cetirizine (ZYRTEC) 10 MG tablet Take 10 mg by mouth daily.    Marland Kitchen escitalopram (LEXAPRO) 20 MG tablet Take 20 mg by mouth daily.     . fenofibrate 160 MG tablet Take 1 tablet (160 mg total) by  mouth daily. 90 tablet 1  . Fluticasone Furoate-Vilanterol (BREO ELLIPTA) 100-25 MCG/INH AEPB Inhale 2 puffs into the lungs at bedtime. 180 each 3  . Multiple Vitamin (MULTIVITAMIN) tablet Take 1 tablet by mouth daily.    . Tiotropium Bromide Monohydrate (SPIRIVA RESPIMAT) 2.5 MCG/ACT AERS Inhale 1 puff into the lungs every morning.    . verapamil (CALAN-SR) 240 MG CR tablet Take 1 tablet (240 mg total) by mouth at bedtime. 90 tablet 2   No current facility-administered medications for this visit.    Allergies:   Penicillins and Sulfonamide derivatives    Social History:  The patient  reports that he quit smoking about 36 years ago. His smoking use included Cigarettes. He has a 75 pack-year smoking history. He has never used smokeless tobacco. He reports that he drinks alcohol. He reports that he does not use illicit drugs.   Family History:  The patient's family history includes  Cancer in his mother; Heart attack in his father. There is no history of Diabetes or Hypertension.    ROS:  Please see the history of present illness.   Otherwise, review of systems are positive for significant back discomfort, leg swelling, irregular heartbeat, easy bruising, difficulty with balance..   All other systems are reviewed and negative.    PHYSICAL EXAM: VS:  BP 118/56 mmHg  Pulse 46  Ht 5\' 3"  (1.6 m)  Wt 71.033 kg (156 lb 9.6 oz)  BMI 27.75 kg/m2  SpO2 94% , BMI Body mass index is 27.75 kg/(m^2). GEN: Well nourished, well developed, in no acute distress HEENT: normal Neck: no JVD, carotid bruits, or masses Cardiac: RRR; no murmurs, rubs, or gallops,no edema  Respiratory:  clear to auscultation bilaterally, normal work of breathing GI: soft, nontender, nondistended, + BS MS: no deformity or atrophy Skin: warm and dry, no rash Neuro:  Strength and sensation are intact Psych: euthymic mood, full affect   EKG:  EKG is ordered today.    Recent Labs: 12/31/2014: ALT 20; BUN 31*; Creatinine, Ser 1.32*; Hemoglobin 11.5*; Platelets 199; Potassium 4.2; Sodium 137    Lipid Panel    Component Value Date/Time   CHOL 135 12/31/2014 0510   TRIG 801* 12/31/2014 0510   HDL 39* 12/31/2014 0510   CHOLHDL 3.5 12/31/2014 0510   VLDL UNABLE TO CALCULATE IF TRIGLYCERIDE OVER 400 mg/dL 12/31/2014 0510   LDLCALC UNABLE TO CALCULATE IF TRIGLYCERIDE OVER 400 mg/dL 12/31/2014 0510   LDLDIRECT 114.2 04/19/2014 1700      Wt Readings from Last 3 Encounters:  01/21/15 71.033 kg (156 lb 9.6 oz)  01/12/15 72.122 kg (159 lb)  12/31/14 73.1 kg (161 lb 2.5 oz)      Other studies Reviewed: Additional studies/ records that were reviewed today include: Recent hospitalization records were reviewed.. Review of the above records demonstrates: Echocardiogram, laboratory data, and diagnostic tests and were all reviewed.   ASSESSMENT AND PLAN:  1. 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  2. Essential hypertension Improved.  3. Atherosclerosis of native coronary artery of native heart without angina pectoris Denies angina.  4. Chronic atrial fibrillation Slow ventricular response. We need to decrease the intensity of verapamil therapy.  5. Chronic diastolic HF (heart failure) No evidence of volume overload    Current medicines are reviewed at length with the patient today.  The patient does not have concerns regarding medicines.  The following changes have been made:  Decreased verapamil SR to 180 mg per day. Monitor heart rate at home. If heart rates remain less than 50 we will further decrease verapamil.  Labs/ tests ordered today include:  No orders of the defined types were placed in this encounter.     Disposition:   FU with HS in 3 months  Signed, Eduardo Grooms, MD  01/21/2015 9:58 AM    [facility] [place], [place], [place]  [address] Phone: [phone]; Fax: [phone]

## 2015-01-26 ENCOUNTER — Ambulatory Visit: Payer: PPO | Admitting: Physical Therapy

## 2015-01-26 VITALS — HR 78

## 2015-01-26 DIAGNOSIS — R2681 Unsteadiness on feet: Secondary | ICD-10-CM

## 2015-01-26 DIAGNOSIS — R531 Weakness: Secondary | ICD-10-CM

## 2015-01-26 DIAGNOSIS — G8191 Hemiplegia, unspecified affecting right dominant side: Secondary | ICD-10-CM | POA: Diagnosis not present

## 2015-01-26 DIAGNOSIS — R269 Unspecified abnormalities of gait and mobility: Secondary | ICD-10-CM

## 2015-01-26 NOTE — Therapy (Signed)
[facility] [address] [place], [place], [address] Phone: [phone]   Fax:  [phone]  Physical Therapy Treatment  Patient Details  Name: Eduardo [name] MRN: [id number] Date of Birth: 1930/08/01 Referring Provider:  Venia Carbon, MD  Encounter Date: 01/26/2015      PT End of Session - 01/26/15 1739    Visit Number 3   Number of Visits 17   Date for PT Re-Evaluation 03/15/15   PT Start Time 0933   PT Stop Time 1018   PT Time Calculation (min) 45 min   Equipment Utilized During Treatment Gait belt   Activity Tolerance Patient tolerated treatment well;No increased pain   Behavior During Therapy [place] for tasks assessed/performed      Past Medical History  Diagnosis Date  . Anemia   . Anxiety   . Atrial fibrillation   . CAD (coronary artery disease)   . Hyperlipidemia   . COPD (chronic obstructive pulmonary disease)   . Allergy   . Osteoarthritis   . Hx of colonic polyps   . Hyperplastic polyp of intestine 2001  . Adenomatous polyp 2001  . Hypertension 12/25/11    pt denies this history  . Myocardial infarction 06/2010    "twice; 1 day apart; during/after cath"  . Exertional dyspnea   . S/P appy   . Arrhythmia     Past Surgical History  Procedure Laterality Date  . Appendectomy    . Carpal tunnel release      left  . Mastoidectomy      x3 on right  . Pilonidal cyst / sinus excision    . Knee arthroscopy      left, right knee 10/2009  . Shoulder arthroscopy      right  . Foot fracture surgery      right heel repair  . Nasal septum surgery      nasal septal repair  . Penile prosthesis implant      x 2--got infection after 2nd and had to remove  . Cataract extraction w/ intraocular lens implant  2/13    Dr [name]  . Carpal tunnel release  10/11/11    Dr Rush Barer  . Tonsillectomy and adenoidectomy    . Fracture surgery    . Cardiac catheterization  2012    50% LAD  and luminal irreg in RCA, 1/12  Post cath MI from damage  . Penile prosthesis  removal      Filed Vitals:   01/26/15 0938  Pulse: 78  SpO2: 97%    Visit Diagnosis:  Abnormality of gait  Unsteadiness  Weakness generalized  Hemiplegia affecting right dominant side      Subjective Assessment - 01/26/15 0938    Subjective Pt reports having seen cardiologist since last session. Veramipil dosage decreased due to HR < 50 bpm. Cardiologist asked that pt monitor HR and let him know if it remains <50 bpm. Pt reports sometimes seeing another PT (pays out of pocket) to address back pain. Pt not seen PT since beginning this POC.   Pertinent History Chronic atrial fibrillation, combined systolic and diastolic heart failure, CAD, HTN. * Notify cardiologist if HR < 50 bpm   Limitations House hold activities;Walking   Patient Stated Goals "Improve my endurance, get better balance, walk like I used to, and get back to my regimen."   Currently in Pain? Yes   Pain Score 4    Pain Location Hip   Pain Orientation Right;Left;Posterior  Pain Descriptors / Indicators Sore   Pain Type Chronic pain   Pain Onset In the past 7 days   Pain Frequency Intermittent   Aggravating Factors  "If I don't take a pain pill"   Pain Relieving Factors "that's hard to call"   Multiple Pain Sites No                         [facility] Adult PT Treatment/Exercise - 01/26/15 0001    Transfers   Transfers Sit to Stand;Stand to Sit   Sit to Stand 6: Modified independent (Device/Increase time)   Stand to Sit 6: Modified independent (Device/Increase time)   Ambulation/Gait   Ambulation/Gait Yes   Ambulation/Gait Assistance 5: Supervision   Ambulation Distance (Feet) 345 Feet   Assistive device Straight cane  "hurrycane"   Gait Pattern Step-through pattern;Decreased step length - left;Decreased stance time - right;Decreased weight shift to right;Right flexed knee in stance;Trendelenburg;Decreased trunk  rotation;Trunk flexed  R Trendelenburg   Ambulation Surface Level;Indoor   Exercises   Exercises Other Exercises   Other Exercises  Pt performed all home exercises (including clamshells and bridging, as performed in previous session) with up to 50% cueing for proper technique. Modified bridging and clamshells to standing, Theraband-resisted B hip ABD, extension due to pt difficulty performing former exercises properly, and increased pt difficulty breathing in supine, sidelying. See Pt Instructions for details on exercises, reps, sets, frequency.                PT Education - 01/26/15 1733    Education provided Yes   Education Details Reviewed/modified HEP. Recommended pt see only one physical therapist at a time to ensure PT ability to assess improvement based on interventions.   Person(s) Educated Patient   Methods Explanation;Demonstration;Tactile cues;Handout   Comprehension Verbalized understanding;Returned demonstration;Need further instruction          PT Short Term Goals - 01/18/15 0910    PT SHORT TERM GOAL #1   Title Pt will perform home exercises with mod I using paper handout to indicate safe HEP compliance and to maximize functional gains. Target date: 02/11/15.   Baseline Initiated on 8/9.   Status On-going   PT SHORT TERM GOAL #2   Title Pt/wife will report consistent participation in home walking program (3-5 days/week at minimum) to facilitate improved endurance, activity tolerance. Target date: 02/11/15.   Baseline Initiated on 8/9.   Status On-going   PT SHORT TERM GOAL #3   Title Pt will increase 6 Minute Walk distance from 693' to 750' to indicate progress towrd significant improvement in functional endurance. Target date: 02/11/15.   Status On-going   PT SHORT TERM GOAL #4   Title Pt will negotiate 12 stairs with 2 rails and mod I to progress toward pt ability to safely access second floor of home without wife providing supervision/assistance. Target date:  02/11/15.   Status New   PT SHORT TERM GOAL #5   Title Perform Berg Balance Scale to assess/address functional standing balance and fall risk. Target date: 02/11/15.   Baseline Scored 39/56 on 8/9.   Status Achieved           PT Long Term Goals - 01/18/15 0911    PT LONG TERM GOAL #1   Title Pt will increase 6 Minute Walk Test distance from 693' to 806' to indicate significant improvement in functional endurance. Target date: 03/11/15   Status On-going   PT LONG TERM GOAL #  2   Title Pt/wife will verbalize understanding of safe fitness regimen for gym progress toward pt-stated goal of being able to safely exercise at gym. Target date: 03/11/15   Status On-going   PT LONG TERM GOAL #3   Title Pt will report no more than 2-point increase in within-session pain rating to indicate ability to perform functional mobility without limitation of pain. Target date: 03/11/15   Status On-going   PT LONG TERM GOAL #4   Title Pt will increase Berg Balance score by 8 points from baseline to indicate significant improvement in functional standing balance. Target date: 03/11/15   Baseline 8/9: baseline Berg score = 39/56   Status On-going   PT LONG TERM GOAL #5   Title Pt/wife will verbalize understanding of CVA warning signs to indicate understanding of situations in which to seek medical attention. Target date: 03/11/15   Status On-going   PT LONG TERM GOAL #6   Title Pt will ambulate > 200' over level surfaces with mod I to indicate increased stability/independence with household mobility. Target date: 03/11/15   Status On-going   PT LONG TERM GOAL #7   Title Pt will ambulate > 300' over unlevel, paved surfaces with mod I using LRAF to indicate increased safety/independence with community mobility, return to PLOF. Target date: 03/11/15   Status On-going   PT LONG TERM GOAL #8   Title Pt will negotiate standard ramp and curb with mod I using LRAD to indicate increased safety/independence negotiating  community obstacles, return to PLOF. Target date: 03/11/15   Status On-going   PT LONG TERM GOAL  #9   TITLE Pt will negotiate 26 stairs with 2 rails requiring single standing rest break (< 1 minute duration) with mod I, increased time to enable pt to safely access second floor of home. Target date: 03/11/15   Status On-going               Plan - 01/26/15 1740    Clinical Impression Statement Focused on functional LE strengthening, single limb stance stability to improve stability/independence with functional mobility. Modified HEP to maximize pt safety, promote proper technique. HR remained WNL (>75 bpm) throughout session. Continue per POC.   Pt will benefit from skilled therapeutic intervention in order to improve on the following deficits Abnormal gait;Decreased endurance;Decreased activity tolerance;Decreased balance;Decreased cognition;Decreased mobility;Decreased strength;Decreased safety awareness;Decreased knowledge of use of DME   Rehab Potential Good   PT Frequency 2x / week   PT Duration 8 weeks   PT Treatment/Interventions ADLs/Self Care Home Management;Functional mobility training;Stair training;Gait training;DME Instruction;Therapeutic activities;Therapeutic exercise;Balance training;Neuromuscular re-education;Patient/family education;Orthotic Fit/Training;Vestibular;Manual techniques   PT Next Visit Plan Consider performing SOT on Balance Master. Ask about walking program. Address CVA risk factors and warning signs.   Consulted and Agree with Plan of Care Patient        Problem List Patient Active Problem List   Diagnosis Date Noted  . 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 CVA (cerebral infarction) 12/31/2014  . Right hand weakness 12/31/2014  . TIA (transient ischemic attack) 12/31/2014  . Advanced directives, counseling/discussion 04/19/2014  . Spinal stenosis, lumbar region, with neurogenic claudication 12/23/2012  . Osteoarthritis,  multiple sites 10/08/2012  . Routine general medical examination at a health care facility 02/25/2012  . NSTEMI (non-ST elevated myocardial infarction) 12/28/2011  . Chronic diastolic HF (heart failure) 12/28/2011    Class: Acute  . Frequent falls 12/28/2011  . Allergic rhinitis 11/11/2008  . Hyperlipemia 04/14/2008  . Anemia  04/14/2008  . Episodic mood disorder 04/14/2008  . Essential hypertension 04/14/2008  . Coronary atherosclerosis 04/14/2008  . Atrial fibrillation 04/14/2008  . COPD (chronic obstructive pulmonary disease) with emphysema 04/14/2008   Billie Ruddy, PT, DPT [facility] [address] [place], [place], [address] Phone: [phone]   Fax:  [phone] 01/26/2015, 5:43 PM

## 2015-01-26 NOTE — Patient Instructions (Addendum)
Hamstring Stretch (Sitting)   Sitting, extend one leg and place hands on same thigh for support. Keeping torso straight, lean forward, sliding hands down leg, until a stretch is felt in back of thigh. This should be a slow, gentle stretch - it should NOT be painful. Hold _60___ seconds. Repeat with other leg. Perform twice on each leg each day. This should improve the soreness in the back of your legs.  SINGLE LIMB STANCE   Stand with your back to a corner. Don't touch the corner with your back, but stand close in case you lose your balance. Place a stable chair in front of you.  Stand on one leg o Try to hold for _10__ seconds. Repeat with other leg. Do this 3 times per day, _5__ days per week   Strengthening: Hip Abduction - Resisted   With RED tubing around right leg, other side toward countertop extend RIGHT leg out from side. LEFT hand should be holding onto countertop. Repeat _10___ times per set. Do _3___ sets per session on each side. Do __2_ sessions per day.   * Be sure you're standing up tall at your waist and that you're leading with your heel. You should feel this in the back of your hip, not the front.   Strengthening: Hip Extension - Resisted   Stand facing your countertop; finger tip support on countertop. With RED tubing around right ankle, pull leg straight back. Repeat _15___ times per set. Do _3___ sets per session on each side. Do __2__ sessions per day.    Walking Program:  Begin walking for exercise for 3 minutes, 2 times/day, 3 days/week. Don't worry about how quickly you walk; go as slowly as you need to in order to walk 3 consecutive minutes. Progress your walking program by adding 1 minute to your routine each week, as tolerated. Be sure to wear good walking shoes, walk in a safe environment (at the gym, as we discussed), use your cane, and only progress to your tolerance.

## 2015-01-28 ENCOUNTER — Ambulatory Visit: Payer: PPO | Admitting: Physical Therapy

## 2015-01-28 VITALS — BP 145/74 | HR 81 | Resp 16

## 2015-01-28 DIAGNOSIS — G8191 Hemiplegia, unspecified affecting right dominant side: Secondary | ICD-10-CM | POA: Diagnosis not present

## 2015-01-28 DIAGNOSIS — IMO0002 Reserved for concepts with insufficient information to code with codable children: Secondary | ICD-10-CM

## 2015-01-28 DIAGNOSIS — Z7409 Other reduced mobility: Secondary | ICD-10-CM

## 2015-01-28 DIAGNOSIS — R531 Weakness: Secondary | ICD-10-CM

## 2015-01-28 DIAGNOSIS — R2681 Unsteadiness on feet: Secondary | ICD-10-CM

## 2015-01-28 DIAGNOSIS — R269 Unspecified abnormalities of gait and mobility: Secondary | ICD-10-CM

## 2015-01-28 NOTE — Therapy (Signed)
[facility] [address] [place], [place], [address] Phone: [phone]   Fax:  [phone]  Physical Therapy Treatment  Patient Details  Name: Eduardo [name] MRN: [id number] Date of Birth: [birth year] Referring Provider:  Venia Carbon, MD  Encounter Date: 01/28/2015      PT End of Session - 01/28/15 1155    Visit Number 4   Number of Visits 17   PT Start Time 1100   PT Stop Time 1145   PT Time Calculation (min) 45 min   Equipment Utilized During Treatment Gait belt   Activity Tolerance Patient tolerated treatment well   Behavior During Therapy [facility] for tasks assessed/performed      Past Medical History  Diagnosis Date  . Anemia   . Anxiety   . Atrial fibrillation   . CAD (coronary artery disease)   . Hyperlipidemia   . COPD (chronic obstructive pulmonary disease)   . Allergy   . Osteoarthritis   . Hx of colonic polyps   . Hyperplastic polyp of intestine 2001  . Adenomatous polyp 2001  . Hypertension 12/25/11    pt denies this history  . Myocardial infarction 06/2010    "twice; 1 day apart; during/after cath"  . Exertional dyspnea   . S/P appy   . Arrhythmia     Past Surgical History  Procedure Laterality Date  . Appendectomy    . Carpal tunnel release      left  . Mastoidectomy      x3 on right  . Pilonidal cyst / sinus excision    . Knee arthroscopy      left, right knee 10/2009  . Shoulder arthroscopy      right  . Foot fracture surgery      right heel repair  . Nasal septum surgery      nasal septal repair  . Penile prosthesis implant      x 2--got infection after 2nd and had to remove  . Cataract extraction w/ intraocular lens implant  2/13    Dr [name]  . Carpal tunnel release  10/11/11    Dr Rush Barer  . Tonsillectomy and adenoidectomy    . Fracture surgery    . Cardiac catheterization  2012    50% LAD and luminal irreg in RCA, 1/12  Post cath MI from damage   . Penile prosthesis  removal      Filed Vitals:   01/28/15 1110  BP: 145/74  Pulse: 81  Resp: 16    Visit Diagnosis:  Abnormality of gait  Weakness generalized  Unsteadiness  Lack of coordination due to stroke  Impaired functional mobility and activity tolerance      Subjective Assessment - 01/28/15 1106    Subjective Stated the hamstring stretch has helped the leg pain;  blood pressure has been good  ; I took it today 138/74  ;  the balance ex's last time were too easy   Currently in Pain? No/denies         Therapeutic exercise;   6 min walk test;  Made .34 mile  (1716ft) ; walking on level surface; inside vq's for breathing;   Vitals afterward at 175/90  HR 80--he did have to focus on breathing  After 2 min rest vitals at 151/85 hr 77  Star Stepping ; both legs;  vq's for slow and high stepping;  Gait belt; CGA;     Walking; gait belt; verbal ques to turn head; disassociate head  and body and eye movt  - 2 laps in gym  230'                     PT Education - 01/28/15 1154    Education provided Yes   Education Details Discussed breathing technique as associated with COPD; instructed to focus on exhalation and getting deoxygenated air out to get good air in .    Person(s) Educated Patient   Methods Explanation   Comprehension Returned demonstration          PT Short Term Goals - 01/18/15 0910    PT SHORT TERM GOAL #1   Title Pt will perform home exercises with mod I using paper handout to indicate safe HEP compliance and to maximize functional gains. Target date: 02/11/15.   Baseline Initiated on 8/9.   Status On-going   PT SHORT TERM GOAL #2   Title Pt/wife will report consistent participation in home walking program (3-5 days/week at minimum) to facilitate improved endurance, activity tolerance. Target date: 02/11/15.   Baseline Initiated on 8/9.   Status On-going   PT SHORT TERM GOAL #3   Title Pt will increase 6 Minute Walk distance  from 693' to 750' to indicate progress towrd significant improvement in functional endurance. Target date: 02/11/15.   Status On-going   PT SHORT TERM GOAL #4   Title Pt will negotiate 12 stairs with 2 rails and mod I to progress toward pt ability to safely access second floor of home without wife providing supervision/assistance. Target date: 02/11/15.   Status New   PT SHORT TERM GOAL #5   Title Perform Berg Balance Scale to assess/address functional standing balance and fall risk. Target date: 02/11/15.   Baseline Scored 39/56 on 8/9.   Status Achieved           PT Long Term Goals - 01/18/15 0911    PT LONG TERM GOAL #1   Title Pt will increase 6 Minute Walk Test distance from 693' to 806' to indicate significant improvement in functional endurance. Target date: 03/11/15   Status On-going   PT LONG TERM GOAL #2   Title Pt/wife will verbalize understanding of safe fitness regimen for gym progress toward pt-stated goal of being able to safely exercise at gym. Target date: 03/11/15   Status On-going   PT LONG TERM GOAL #3   Title Pt will report no more than 2-point increase in within-session pain rating to indicate ability to perform functional mobility without limitation of pain. Target date: 03/11/15   Status On-going   PT LONG TERM GOAL #4   Title Pt will increase Berg Balance score by 8 points from baseline to indicate significant improvement in functional standing balance. Target date: 03/11/15   Baseline 8/9: baseline Berg score = 39/56   Status On-going   PT LONG TERM GOAL #5   Title Pt/wife will verbalize understanding of CVA warning signs to indicate understanding of situations in which to seek medical attention. Target date: 03/11/15   Status On-going   PT LONG TERM GOAL #6   Title Pt will ambulate > 200' over level surfaces with mod I to indicate increased stability/independence with household mobility. Target date: 03/11/15   Status On-going   PT LONG TERM GOAL #7   Title Pt will  ambulate > 300' over unlevel, paved surfaces with mod I using LRAF to indicate increased safety/independence with community mobility, return to PLOF. Target date: 03/11/15   Status On-going  PT LONG TERM GOAL #8   Title Pt will negotiate standard ramp and curb with mod I using LRAD to indicate increased safety/independence negotiating community obstacles, return to PLOF. Target date: 03/11/15   Status On-going   PT LONG TERM GOAL  #9   TITLE Pt will negotiate 26 stairs with 2 rails requiring single standing rest break (< 1 minute duration) with mod I, increased time to enable pt to safely access second floor of home. Target date: 03/11/15   Status On-going               Plan - 01/28/15 1156    Clinical Impression Statement Patient was able to complete the 6 min walk test with the vq's to keep breathing focused on exhalation; helped him not panic;  he demonstrated effecitve step reflex with loss of balance during the balance and gait activities; he felt the tx was effective and appeared to understand concept of seperating head/eye/body movt during mvt to stim balance reacitons;  vitals were WNL; expecpt his diastolic increased by 20 ; he was aymptomatic throughout the tx;  cont per POC        Problem List Patient Active Problem List   Diagnosis Date Noted  . 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 CVA (cerebral infarction) 12/31/2014  . Right hand weakness 12/31/2014  . TIA (transient ischemic attack) 12/31/2014  . Advanced directives, counseling/discussion 04/19/2014  . Spinal stenosis, lumbar region, with neurogenic claudication 12/23/2012  . Osteoarthritis, multiple sites 10/08/2012  . Routine general medical examination at a health care facility 02/25/2012  . NSTEMI (non-ST elevated myocardial infarction) 12/28/2011  . Chronic diastolic HF (heart failure) 12/28/2011    Class: Acute  . Frequent falls 12/28/2011  . Allergic rhinitis 11/11/2008  .  Hyperlipemia 04/14/2008  . Anemia 04/14/2008  . Episodic mood disorder 04/14/2008  . Essential hypertension 04/14/2008  . Coronary atherosclerosis 04/14/2008  . Atrial fibrillation 04/14/2008  . COPD (chronic obstructive pulmonary disease) with emphysema 04/14/2008    Rosaura Carpenter D PT DPT 01/28/2015, 12:01 PM  [facility] [address] [place], [place], [address] Phone: [phone]   Fax:  [phone]

## 2015-01-31 ENCOUNTER — Ambulatory Visit: Payer: PPO | Admitting: Physical Therapy

## 2015-01-31 ENCOUNTER — Ambulatory Visit: Payer: PPO | Admitting: Occupational Therapy

## 2015-01-31 ENCOUNTER — Encounter: Payer: Self-pay | Admitting: Occupational Therapy

## 2015-01-31 DIAGNOSIS — R531 Weakness: Secondary | ICD-10-CM

## 2015-01-31 DIAGNOSIS — G8191 Hemiplegia, unspecified affecting right dominant side: Secondary | ICD-10-CM | POA: Diagnosis not present

## 2015-01-31 DIAGNOSIS — IMO0002 Reserved for concepts with insufficient information to code with codable children: Secondary | ICD-10-CM

## 2015-01-31 DIAGNOSIS — R269 Unspecified abnormalities of gait and mobility: Secondary | ICD-10-CM

## 2015-01-31 DIAGNOSIS — Z7409 Other reduced mobility: Secondary | ICD-10-CM

## 2015-01-31 DIAGNOSIS — R2681 Unsteadiness on feet: Secondary | ICD-10-CM

## 2015-01-31 NOTE — Therapy (Signed)
[facility] [address] [place], [place], [address] Phone: [phone]   Fax:  [phone]  Occupational Therapy Treatment  Patient Details  Name: Eduardo [name] MRN: [id number] Date of Birth: 17-May-1931 Referring Provider:  Venia Carbon, MD  Encounter Date: 01/31/2015      OT End of Session - 01/31/15 1305    Visit Number 4   Number of Visits 16   Date for OT Re-Evaluation 03/11/15   Authorization Type [employer] - assume medicare managed plan and will need G code!!!   Authorization Time Period 01/14/2015-03/11/2015   OT Start Time 0930   OT Stop Time 1013   OT Time Calculation (min) 43 min   Activity Tolerance Patient tolerated treatment well      Past Medical History  Diagnosis Date  . Anemia   . Anxiety   . Atrial fibrillation   . CAD (coronary artery disease)   . Hyperlipidemia   . COPD (chronic obstructive pulmonary disease)   . Allergy   . Osteoarthritis   . Hx of colonic polyps   . Hyperplastic polyp of intestine 2001  . Adenomatous polyp 2001  . Hypertension 12/25/11    pt denies this history  . Myocardial infarction 06/2010    "twice; 1 day apart; during/after cath"  . Exertional dyspnea   . S/P appy   . Arrhythmia     Past Surgical History  Procedure Laterality Date  . Appendectomy    . Carpal tunnel release      left  . Mastoidectomy      x3 on right  . Pilonidal cyst / sinus excision    . Knee arthroscopy      left, right knee 10/2009  . Shoulder arthroscopy      right  . Foot fracture surgery      right heel repair  . Nasal septum surgery      nasal septal repair  . Penile prosthesis implant      x 2--got infection after 2nd and had to remove  . Cataract extraction w/ intraocular lens implant  2/13    Dr [name]  . Carpal tunnel release  10/11/11    Dr Rush Barer  . Tonsillectomy and adenoidectomy    . Fracture surgery    . Cardiac  catheterization  2012    50% LAD and luminal irreg in RCA, 1/12  Post cath MI from damage  . Penile prosthesis  removal      There were no vitals filed for this visit.  Visit Diagnosis:  Lack of coordination due to stroke  Impaired functional mobility and activity tolerance  Hemiplegia affecting right dominant side      Subjective Assessment - 01/31/15 0932    Subjective  My hand is doing better - the writing is getting better slowly   Pertinent History see epic snapshot   Patient Stated Goals get my hand working better   Currently in Pain? Yes   Pain Score 4    Pain Location Leg   Pain Orientation Right;Left;Posterior   Pain Descriptors / Indicators Sore   Pain Type Chronic pain   Pain Onset More than a month ago   Pain Frequency Intermittent   Aggravating Factors  If don't take a pain pill, worse in the morning   Pain Relieving Factors getting up and moving around, pain meds   Effect of Pain on Daily Activities limits my activity  OT Treatments/Exercises (OP) - 01/31/15 0001    ADLs   Eating Pt issued red foam to use on fork at home. Pt also stated he felt like wrist was weak when trying to hold fork. Pt given wrist exercsies to do at gym with 4 pound weight. Pt able to do 10 reps.    Writing Pt practiced writing at 3 sentence paragraph level. Pt benefited from minimal build up with coban wrap. Pt issued some coban for home use. Pt with 50% legibility.   Exercises   Exercises Hand   Hand Exercises   Hand Gripper with Small Beads 1 inch blocks with 35 pounds of resistance with 3 rest breaks.    Other Hand Exercises Demonstrated and had pt return demonstrate activities from HEP for fine motor program .Pt with moderate difficulty with most tasks. Pt became frustrated - recommended pt do short burst of activities vs long sessions. Pt in agreement and able to verbaliize understanding.                 OT Education - 01/31/15 1004     Education provided Yes   Education Details fine motor HEP, wrist exercises   Person(s) Educated Patient   Methods Explanation;Demonstration;Verbal cues;Handout   Comprehension Verbalized understanding;Returned demonstration          OT Short Term Goals - 01/31/15 1301    OT SHORT TERM GOAL #1   Title Pt will be mod I with HEP - 02/11/2115   Status On-going   OT SHORT TERM GOAL #2   Title Pt will demonstrate improved grip strenth in R hand by at least 5 pounds (baseline=45 pounds) to assist with functional tasks   Status On-going   OT SHORT TERM GOAL #3   Title Pt will demonstrate improved pinch strength R hand by at least 2 pounds combined (baseline = 31) to assist with functional tasks   Status On-going   OT SHORT TERM GOAL #4   Title Pt will be mod I with cutting meat, AE prn   Status On-going   OT SHORT TERM GOAL #5   Title Pt will demonstrate mod I with selective attention for basic familiar functional tasks   Status On-going   Additional Short Term Goals   Additional Short Term Goals Yes   OT SHORT TERM GOAL #6   Title Pt will complete at least 10 minutes of functional activity without rest breaks   Status On-going   OT SHORT TERM GOAL #7   Title Pt will demonstrate ability to write at 2 sentence level with 80% legibility with AE prn   Status New           OT Long Term Goals - 01/31/15 1302    OT LONG TERM GOAL #1   Title Pt will be mod I with upgraded HEP- 03/11/2015   Status On-going   OT LONG TERM GOAL #2   Title Pt will demonstrate mod I with alternating attention with basic familiar functional tasks   Status On-going   OT LONG TERM GOAL #3   Title Pt will demonstrate improved grip strength in R hand by at least 8 pounds (baseline= 45) to assist with functional tasks   Status On-going   OT LONG TERM GOAL #4   Title Pt will be mod I with simple home mgmt tasks   Status On-going   OT LONG TERM GOAL #5   Title Pt will be able to complete 20 minutes of activity  without rest  break   Status On-going   Long Term Additional Goals   Additional Long Term Goals Yes   OT LONG TERM GOAL #6   Title Pt will demonstrate improved coordination as evidenced by decreasing time on 9 hole peg test by at least 10 seconds (baseline = 53.43) to assist with functional tasks   Status On-going   OT LONG TERM GOAL #7   Title Pt will demonstrate ability to write at 3 sentence level with 100% legibility with  AE prn   Status New               Plan - 01/31/15 1303    Clinical Impression Statement Pt progressing toward goals. Pt has been compliant with HEP and issued fine motor program today. Pt able to return demonstrate.   Pt will benefit from skilled therapeutic intervention in order to improve on the following deficits (Retired) Decreased activity tolerance;Decreased balance;Decreased cognition;Decreased mobility;Decreased endurance;Decreased coordination;Decreased safety awareness;Decreased strength;Impaired UE functional use   Rehab Potential Good   OT Frequency 2x / week   OT Duration 8 weeks   OT Treatment/Interventions Self-care/ADL training;Fluidtherapy;Therapeutic exercise;Neuromuscular education;Manual Therapy;Functional Mobility Training;DME and/or AE instruction;Cognitive remediation/compensation;Therapeutic activities;Patient/family education;Balance training   Plan continue strength, coordination and functional RUE use, especially writing.   Consulted and Agree with Plan of Care Patient        Problem List Patient Active Problem List   Diagnosis Date Noted  . 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 CVA (cerebral infarction) 12/31/2014  . Right hand weakness 12/31/2014  . TIA (transient ischemic attack) 12/31/2014  . Advanced directives, counseling/discussion 04/19/2014  . Spinal stenosis, lumbar region, with neurogenic claudication 12/23/2012  . Osteoarthritis, multiple sites 10/08/2012  . Routine general medical  examination at a health care facility 02/25/2012  . NSTEMI (non-ST elevated myocardial infarction) 12/28/2011  . Chronic diastolic HF (heart failure) 12/28/2011    Class: Acute  . Frequent falls 12/28/2011  . Allergic rhinitis 11/11/2008  . Hyperlipemia 04/14/2008  . Anemia 04/14/2008  . Episodic mood disorder 04/14/2008  . Essential hypertension 04/14/2008  . Coronary atherosclerosis 04/14/2008  . Atrial fibrillation 04/14/2008  . COPD (chronic obstructive pulmonary disease) with emphysema 04/14/2008    Quay Burow, OTR/L 01/31/2015, 1:06 PM  [facility] [address] [place], [place], [address] Phone: [phone]   Fax:  [phone]

## 2015-01-31 NOTE — Patient Instructions (Addendum)
Exercises for your right wrist.  1. Sit at table with your hand off the table and palm facing down.  Hold 4 pound weight. Use your left hand to hold right forearm on the table. Pull wrist back and hold for count of 5 Return to starting position.  Do 10, take a break and do 10 more. Do 1 time per day.  STOP if you get wrist pain and let your therapist know.     Coordination Activities  Perform the following activities for 15-20 minutes 2 times per day with right hand(s).   Rotate ball in fingertips (clockwise and counter-clockwise).  Toss ball between hands.  Toss ball in air and catch with the same hand.  Flip cards 1 at a time as fast as you can.  Deal cards with your thumb (Hold deck in hand and push card off top with thumb).  Rotate card in hand (clockwise and counter-clockwise).  Shuffle cards.  Pick up coins and place in container or coin bank.

## 2015-01-31 NOTE — Therapy (Signed)
[facility] [address] [place], [place], [address] Phone: [phone]   Fax:  [phone]  Physical Therapy Treatment  Patient Details  Name: Eduardo [name] MRN: [id number] Date of Birth: June 27, 1930 Referring Provider:  Venia Carbon, MD  Encounter Date: 01/31/2015      PT End of Session - 01/31/15 1629    Visit Number 5   Number of Visits 17   Date for PT Re-Evaluation 03/15/15   PT Start Time 0842   PT Stop Time 0928   PT Time Calculation (min) 46 min   Equipment Utilized During Treatment Gait belt   Activity Tolerance Patient tolerated treatment well   Behavior During Therapy [facility] for tasks assessed/performed      Past Medical History  Diagnosis Date  . Anemia   . Anxiety   . Atrial fibrillation   . CAD (coronary artery disease)   . Hyperlipidemia   . COPD (chronic obstructive pulmonary disease)   . Allergy   . Osteoarthritis   . Hx of colonic polyps   . Hyperplastic polyp of intestine 2001  . Adenomatous polyp 2001  . Hypertension 12/25/11    pt denies this history  . Myocardial infarction 06/2010    "twice; 1 day apart; during/after cath"  . Exertional dyspnea   . S/P appy   . Arrhythmia     Past Surgical History  Procedure Laterality Date  . Appendectomy    . Carpal tunnel release      left  . Mastoidectomy      x3 on right  . Pilonidal cyst / sinus excision    . Knee arthroscopy      left, right knee 10/2009  . Shoulder arthroscopy      right  . Foot fracture surgery      right heel repair  . Nasal septum surgery      nasal septal repair  . Penile prosthesis implant      x 2--got infection after 2nd and had to remove  . Cataract extraction w/ intraocular lens implant  2/13    Dr [name]  . Carpal tunnel release  10/11/11    Dr Rush Barer  . Tonsillectomy and adenoidectomy    . Fracture surgery    . Cardiac catheterization  2012    50% LAD and luminal irreg in  RCA, 1/12  Post cath MI from damage  . Penile prosthesis  removal      There were no vitals filed for this visit.  Visit Diagnosis:  Abnormality of gait  Weakness generalized  Unsteadiness  Hemiplegia affecting right dominant side      Subjective Assessment - 01/31/15 0843    Subjective Pt expressing understanding that he needs to breathe more regularly during walking. Denies falls.   Pertinent History Chronic atrial fibrillation, combined systolic and diastolic heart failure, CAD, HTN. * Notify cardiologist if HR < 50 bpm   Limitations House hold activities;Walking   Patient Stated Goals "Improve my endurance, get better balance, walk like I used to, and get back to my regimen."   Currently in Pain? No/denies                         [facility] Adult PT Treatment/Exercise - 01/31/15 0001    Ambulation/Gait   Ambulation/Gait Yes   Ambulation/Gait Assistance 4: Min guard   Ambulation Distance (Feet) 415 Feet  then multiple trials x75-115' with focus on core stability  Assistive device None   Gait Pattern Step-through pattern;Decreased step length - left;Decreased stance time - right;Right flexed knee in stance;Trendelenburg;Decreased trunk rotation;Trunk flexed  R Trendelenburg noted during final 100'   Ambulation Surface Level;Indoor   Gait Comments Cueing focused on consistent breathing, pursed lip breathing during iniital trial. During subsequent shorter-distance trials, cueing focused on core stability, RLE stance stability.   Self-Care   Self-Care Other Self-Care Comments   Other Self-Care Comments  Discussed CVA risk factors, warning signs. Paper handout provided.   Neuro Re-ed    Neuro Re-ed Details  Pt performed the following activities in tall kneeling without UE support to increase proximal stability/control, to increase R stance stability: static tall kneeling x3 consecutive minutes; forward/retro stepping with RLE then LLE with tactile cueing for postural  alignment. Transitioned from tall kneeling <> quadruped for increased proprioceptive input. In quadruped, pt performed shoulder flexion with concurrent extension of contralateral LE x10 reps, x5 reps. Activity ended secondary to increased pain/muscle fatigue in B gluteus medius. Standing in front of mirror for visual feedback, pt performed alternating toe taps on 2" step with verbal cueing to increase pt attention to mirror/awareness of pelvic/hip instability (on RLE > LLE). Per pt request to perform tall kneeling at home, performed floor transfer using mat to ensure pt safety perform home activity. Pt  gave effective return demonstration of floor transfer with mod I. In tall kneeling without UE support, pt performed forward/retro /B lateral stepping without UE support x5 steps per direction. Activity ended secondary to R hamstring cramp when attempting squats in tall kneeling.                PT Education - 01/31/15 0850    Education provided Yes   Education Details CVA risk factors, warning signs.   Person(s) Educated Patient   Methods Explanation;Handout   Comprehension Verbalized understanding          PT Short Term Goals - 01/18/15 0910    PT SHORT TERM GOAL #1   Title Pt will perform home exercises with mod I using paper handout to indicate safe HEP compliance and to maximize functional gains. Target date: 02/11/15.   Baseline Initiated on 8/9.   Status On-going   PT SHORT TERM GOAL #2   Title Pt/wife will report consistent participation in home walking program (3-5 days/week at minimum) to facilitate improved endurance, activity tolerance. Target date: 02/11/15.   Baseline Initiated on 8/9.   Status On-going   PT SHORT TERM GOAL #3   Title Pt will increase 6 Minute Walk distance from 693' to 750' to indicate progress towrd significant improvement in functional endurance. Target date: 02/11/15.   Status On-going   PT SHORT TERM GOAL #4   Title Pt will negotiate 12 stairs with 2  rails and mod I to progress toward pt ability to safely access second floor of home without wife providing supervision/assistance. Target date: 02/11/15.   Status New   PT SHORT TERM GOAL #5   Title Perform Berg Balance Scale to assess/address functional standing balance and fall risk. Target date: 02/11/15.   Baseline Scored 39/56 on 8/9.   Status Achieved           PT Long Term Goals - 01/18/15 0911    PT LONG TERM GOAL #1   Title Pt will increase 6 Minute Walk Test distance from 693' to 806' to indicate significant improvement in functional endurance. Target date: 03/11/15   Status On-going   PT LONG TERM  GOAL #2   Title Pt/wife will verbalize understanding of safe fitness regimen for gym progress toward pt-stated goal of being able to safely exercise at gym. Target date: 03/11/15   Status On-going   PT LONG TERM GOAL #3   Title Pt will report no more than 2-point increase in within-session pain rating to indicate ability to perform functional mobility without limitation of pain. Target date: 03/11/15   Status On-going   PT LONG TERM GOAL #4   Title Pt will increase Berg Balance score by 8 points from baseline to indicate significant improvement in functional standing balance. Target date: 03/11/15   Baseline 8/9: baseline Berg score = 39/56   Status On-going   PT LONG TERM GOAL #5   Title Pt/wife will verbalize understanding of CVA warning signs to indicate understanding of situations in which to seek medical attention. Target date: 03/11/15   Status On-going   PT LONG TERM GOAL #6   Title Pt will ambulate > 200' over level surfaces with mod I to indicate increased stability/independence with household mobility. Target date: 03/11/15   Status On-going   PT LONG TERM GOAL #7   Title Pt will ambulate > 300' over unlevel, paved surfaces with mod I using LRAF to indicate increased safety/independence with community mobility, return to PLOF. Target date: 03/11/15   Status On-going   PT LONG  TERM GOAL #8   Title Pt will negotiate standard ramp and curb with mod I using LRAD to indicate increased safety/independence negotiating community obstacles, return to PLOF. Target date: 03/11/15   Status On-going   PT LONG TERM GOAL  #9   TITLE Pt will negotiate 26 stairs with 2 rails requiring single standing rest break (< 1 minute duration) with mod I, increased time to enable pt to safely access second floor of home. Target date: 03/11/15   Status On-going               Plan - 01/31/15 1630    Clinical Impression Statement Session focused on increased proximal stability/control, RLE stance stability to improve stability and energy efficiency of mobility/gait. Pt exhibited increased awareness of postural/gait impairments within this session and was able to self-correct with visual feedback of mirror. Continue per POC.   Pt will benefit from skilled therapeutic intervention in order to improve on the following deficits Abnormal gait;Decreased endurance;Decreased activity tolerance;Decreased balance;Decreased cognition;Decreased mobility;Decreased strength;Decreased safety awareness;Decreased knowledge of use of DME   Rehab Potential Good   PT Frequency 2x / week   PT Duration 8 weeks   PT Treatment/Interventions ADLs/Self Care Home Management;Functional mobility training;Stair training;Gait training;DME Instruction;Therapeutic activities;Therapeutic exercise;Balance training;Neuromuscular re-education;Patient/family education;Orthotic Fit/Training;Vestibular;Manual techniques   PT Next Visit Plan Continue to address proximal stability/control, single limb stance stability (focus on RLE), breathing technques during mobility.   Consulted and Agree with Plan of Care Patient        Problem List Patient Active Problem List   Diagnosis Date Noted  . 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 CVA (cerebral infarction) 12/31/2014  . Right hand weakness 12/31/2014  .  TIA (transient ischemic attack) 12/31/2014  . Advanced directives, counseling/discussion 04/19/2014  . Spinal stenosis, lumbar region, with neurogenic claudication 12/23/2012  . Osteoarthritis, multiple sites 10/08/2012  . Routine general medical examination at a health care facility 02/25/2012  . NSTEMI (non-ST elevated myocardial infarction) 12/28/2011  . Chronic diastolic HF (heart failure) 12/28/2011    Class: Acute  . Frequent falls 12/28/2011  . Allergic rhinitis 11/11/2008  .  Hyperlipemia 04/14/2008  . Anemia 04/14/2008  . Episodic mood disorder 04/14/2008  . Essential hypertension 04/14/2008  . Coronary atherosclerosis 04/14/2008  . Atrial fibrillation 04/14/2008  . COPD (chronic obstructive pulmonary disease) with emphysema 04/14/2008    Billie Ruddy, PT, DPT [facility] [address] [place], [place], [address] Phone: [phone]   Fax:  [phone] 01/31/2015, 4:35 PM

## 2015-02-02 ENCOUNTER — Ambulatory Visit: Payer: PPO | Admitting: Physical Therapy

## 2015-02-02 ENCOUNTER — Ambulatory Visit: Payer: PPO | Admitting: Occupational Therapy

## 2015-02-02 ENCOUNTER — Encounter: Payer: Self-pay | Admitting: Occupational Therapy

## 2015-02-02 VITALS — HR 87

## 2015-02-02 DIAGNOSIS — G8191 Hemiplegia, unspecified affecting right dominant side: Secondary | ICD-10-CM

## 2015-02-02 DIAGNOSIS — R2681 Unsteadiness on feet: Secondary | ICD-10-CM

## 2015-02-02 DIAGNOSIS — R269 Unspecified abnormalities of gait and mobility: Secondary | ICD-10-CM

## 2015-02-02 DIAGNOSIS — IMO0002 Reserved for concepts with insufficient information to code with codable children: Secondary | ICD-10-CM

## 2015-02-02 NOTE — Therapy (Signed)
[facility] [address] [place], [place], [address] Phone: [phone]   Fax:  [phone]  Occupational Therapy Treatment  Patient Details  Name: Eduardo [name] MRN: [id number] Date of Birth: 06/29/1930 Referring Provider:  Venia Carbon, MD  Encounter Date: 02/02/2015      OT End of Session - 02/02/15 1222    Visit Number 5   Number of Visits 16   Date for OT Re-Evaluation 03/11/15   Authorization Type [employer] - assume medicare managed plan and will need G code!!!   Authorization Time Period 01/14/2015-03/11/2015   Authorization - Visit Number 5   Authorization - Number of Visits 10   OT Start Time 1145   OT Stop Time 1227   OT Time Calculation (min) 42 min   Activity Tolerance Patient tolerated treatment well   Behavior During Therapy WFL for tasks assessed/performed      Past Medical History  Diagnosis Date  . Anemia   . Anxiety   . Atrial fibrillation   . CAD (coronary artery disease)   . Hyperlipidemia   . COPD (chronic obstructive pulmonary disease)   . Allergy   . Osteoarthritis   . Hx of colonic polyps   . Hyperplastic polyp of intestine 2001  . Adenomatous polyp 2001  . Hypertension 12/25/11    pt denies this history  . Myocardial infarction 06/2010    "twice; 1 day apart; during/after cath"  . Exertional dyspnea   . S/P appy   . Arrhythmia     Past Surgical History  Procedure Laterality Date  . Appendectomy    . Carpal tunnel release      left  . Mastoidectomy      x3 on right  . Pilonidal cyst / sinus excision    . Knee arthroscopy      left, right knee 10/2009  . Shoulder arthroscopy      right  . Foot fracture surgery      right heel repair  . Nasal septum surgery      nasal septal repair  . Penile prosthesis implant      x 2--got infection after 2nd and had to remove  . Cataract extraction w/ intraocular lens implant  2/13    Dr [name]  . Carpal  tunnel release  10/11/11    Dr Rush Barer  . Tonsillectomy and adenoidectomy    . Fracture surgery    . Cardiac catheterization  2012    50% LAD and luminal irreg in RCA, 1/12  Post cath MI from damage  . Penile prosthesis  removal      There were no vitals filed for this visit.  Visit Diagnosis:  Lack of coordination due to stroke  Hemiplegia affecting right dominant side      Subjective Assessment - 02/02/15 1151    Subjective  My signature when I sign checks looks like somebody else wrote it.  Some of it is legible.   Pertinent History see epic snapshot   Patient Stated Goals get my hand working better   Currently in Pain? Yes   Pain Score 3    Pain Location Back   Pain Orientation Right;Left;Lower   Pain Descriptors / Indicators Aching   Pain Type Acute pain   Pain Onset Yesterday   Pain Frequency Intermittent   Aggravating Factors  problem in the morning   Pain Relieving Factors better after stretching   Effect of Pain on Daily Activities I walk hunched  over in the morning   Multiple Pain Sites No                      OT Treatments/Exercises (OP) - 02/02/15 0001    Hand Exercises   Digiticizer 3,5,7 lb digiflex - needs to do 4th and 5th digit concurrently, 5 reps of 5, digiextend red - limited thumb due tro prior  thumb injury   Other Hand Exercises in hand manipulation with steel balls clockwise and counterclockwise.  Rotating nuts and bolts with right hand only.  Gripper at 35 lbs for 5 min   Other Hand Exercises Forearm gym to address forearm wrist, and hand coordination / relationship.  Worked on strengthening with resistive clothespins to address muscle endurance for right hand.                  OT Education - 02/02/15 1219    Education provided Yes   Education Details coordination exercises, reviewed putty exercises   Person(s) Educated Patient   Methods Explanation;Demonstration   Comprehension Verbalized understanding;Returned  demonstration          OT Short Term Goals - 02/02/15 1224    OT SHORT TERM GOAL #2   Title Pt will demonstrate improved grip strenth in R hand by at least 5 pounds (baseline=45 pounds) to assist with functional tasks   Status Achieved  60 lbs           OT Long Term Goals - 01/31/15 1302    OT LONG TERM GOAL #1   Title Pt will be mod I with upgraded HEP- 03/11/2015   Status On-going   OT LONG TERM GOAL #2   Title Pt will demonstrate mod I with alternating attention with basic familiar functional tasks   Status On-going   OT LONG TERM GOAL #3   Title Pt will demonstrate improved grip strength in R hand by at least 8 pounds (baseline= 45) to assist with functional tasks   Status On-going   OT LONG TERM GOAL #4   Title Pt will be mod I with simple home mgmt tasks   Status On-going   OT LONG TERM GOAL #5   Title Pt will be able to complete 20 minutes of activity without rest break   Status On-going   Long Term Additional Goals   Additional Long Term Goals Yes   OT LONG TERM GOAL #6   Title Pt will demonstrate improved coordination as evidenced by decreasing time on 9 hole peg test by at least 10 seconds (baseline = 53.43) to assist with functional tasks   Status On-going   OT LONG TERM GOAL #7   Title Pt will demonstrate ability to write at 3 sentence level with 100% legibility with  AE prn   Status New               Plan - 02/02/15 1223    Clinical Impression Statement Patient actively working on home exercise  / activity program and is progressing toward OT goals.  Patient very motivated for improved fiunction in right hand   Pt will benefit from skilled therapeutic intervention in order to improve on the following deficits (Retired) Decreased activity tolerance;Decreased balance;Decreased cognition;Decreased mobility;Decreased endurance;Decreased coordination;Decreased safety awareness;Decreased strength;Impaired UE functional use   Rehab Potential Good   OT  Frequency 2x / week   OT Duration 8 weeks   OT Treatment/Interventions Self-care/ADL training;Fluidtherapy;Therapeutic exercise;Neuromuscular education;Manual Therapy;Functional Mobility Training;DME and/or AE instruction;Cognitive remediation/compensation;Therapeutic activities;Patient/family education;Balance training  Plan strength, coordination and functional use of RUE, especially writing   OT Home Exercise Plan added coordination exercise   Consulted and Agree with Plan of Care Patient        Problem List Patient Active Problem List   Diagnosis Date Noted  . 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 CVA (cerebral infarction) 12/31/2014  . Right hand weakness 12/31/2014  . TIA (transient ischemic attack) 12/31/2014  . Advanced directives, counseling/discussion 04/19/2014  . Spinal stenosis, lumbar region, with neurogenic claudication 12/23/2012  . Osteoarthritis, multiple sites 10/08/2012  . Routine general medical examination at a health care facility 02/25/2012  . NSTEMI (non-ST elevated myocardial infarction) 12/28/2011  . Chronic diastolic HF (heart failure) 12/28/2011    Class: Acute  . Frequent falls 12/28/2011  . Allergic rhinitis 11/11/2008  . Hyperlipemia 04/14/2008  . Anemia 04/14/2008  . Episodic mood disorder 04/14/2008  . Essential hypertension 04/14/2008  . Coronary atherosclerosis 04/14/2008  . Atrial fibrillation 04/14/2008  . COPD (chronic obstructive pulmonary disease) with emphysema 04/14/2008    Mariah Milling, OTR/L 02/02/2015, 12:32 PM  [facility] [address] [place], [place], [address] Phone: [phone]   Fax:  [phone]

## 2015-02-02 NOTE — Patient Instructions (Signed)
Hip Stretch   In seated, cross your right knee over your left and GENTLY bring your chest toward your crossed. Hold for 60 seconds, 2 times to help with pain in right buttock.

## 2015-02-02 NOTE — Therapy (Signed)
[facility] [address] [place], [place], [address] Phone: [phone]   Fax:  [phone]  Physical Therapy Treatment  Patient Details  Name: Eduardo [name] MRN: [id number] Date of Birth: 1931-02-04 Referring Provider:  Venia Carbon, MD  Encounter Date: 02/02/2015      PT End of Session - 02/02/15 2007    Visit Number 6   Number of Visits 17   Date for PT Re-Evaluation 03/15/15   PT Start Time 1103   PT Stop Time 1145   PT Time Calculation (min) 42 min   Equipment Utilized During Treatment Gait belt   Activity Tolerance Patient limited by pain   Behavior During Therapy [facility] for tasks assessed/performed      Past Medical History  Diagnosis Date  . Anemia   . Anxiety   . Atrial fibrillation   . CAD (coronary artery disease)   . Hyperlipidemia   . COPD (chronic obstructive pulmonary disease)   . Allergy   . Osteoarthritis   . Hx of colonic polyps   . Hyperplastic polyp of intestine 2001  . Adenomatous polyp 2001  . Hypertension 12/25/11    pt denies this history  . Myocardial infarction 06/2010    "twice; 1 day apart; during/after cath"  . Exertional dyspnea   . S/P appy   . Arrhythmia     Past Surgical History  Procedure Laterality Date  . Appendectomy    . Carpal tunnel release      left  . Mastoidectomy      x3 on right  . Pilonidal cyst / sinus excision    . Knee arthroscopy      left, right knee 10/2009  . Shoulder arthroscopy      right  . Foot fracture surgery      right heel repair  . Nasal septum surgery      nasal septal repair  . Penile prosthesis implant      x 2--got infection after 2nd and had to remove  . Cataract extraction w/ intraocular lens implant  2/13    Dr [name]  . Carpal tunnel release  10/11/11    Dr Rush Barer  . Tonsillectomy and adenoidectomy    . Fracture surgery    . Cardiac catheterization  2012    50% LAD and luminal irreg in RCA,  1/12  Post cath MI from damage  . Penile prosthesis  removal      Filed Vitals:   02/02/15 1107  Pulse: 87  SpO2: 92%    Visit Diagnosis:  Abnormality of gait  Hemiplegia affecting right dominant side  Unsteadiness      Subjective Assessment - 02/02/15 1107    Subjective Pt reporting increased pain in R buttock since last PT session. Pt reported 4/10 pain upon waking, 2/10 pain at this time. Pt pointing to R piriformis as area of pain.   Pertinent History Chronic atrial fibrillation, combined systolic and diastolic heart failure, CAD, HTN. * Notify cardiologist if HR < 50 bpm   Limitations House hold activities;Walking   Patient Stated Goals "Improve my endurance, get better balance, walk like I used to, and get back to my regimen."   Currently in Pain? Yes   Pain Score 2    Pain Location Buttocks   Pain Orientation Right;Posterior   Pain Descriptors / Indicators Dull;Aching   Pain Type Acute pain   Pain Radiating Towards N/A   Pain Onset Yesterday   Pain Frequency Intermittent  Aggravating Factors  worst in the morning   Pain Relieving Factors better after stretching   Effect of Pain on Daily Activities "makes me walk around like an old man"   Multiple Pain Sites No            [facility] PT Assessment - 02/02/15 0001    Palpation   Palpation comment (+) R Fortin Finger Test, suggestive of pain in R SI joint   Special Tests    Special Tests Sacrolliac Tests   Sacroiliac Tests  Pelvic Compression   Pelvic Dictraction   Findings Negative   Pelvic Compression   Findings Negative   Sacral thrust    Findings Positive   Side Right   Gaenslen's test   Findings Positive   Side  Left   Comments pain in R SI Joint with L Gaenslen's                     [facility] Adult PT Treatment/Exercise - 02/02/15 1951    Ambulation/Gait   Ambulation/Gait Yes   Ambulation/Gait Assistance 4: Min guard   Ambulation Distance (Feet) 619 Feet  x115' then x504'    Assistive  device Straight cane   Gait Pattern Step-through pattern;Decreased step length - left;Decreased stance time - right;Right flexed knee in stance;Trendelenburg;Decreased trunk rotation;Trunk flexed  R Trendelenberg with increased fatigue during 6MWT   Ambulation Surface Level;Indoor   Gait Comments Initiated 6MWT; however, pt unable to ambulate >504' (2 min, 50 sec) consecutively prior to requesting seated rest break due to pain in R PSIS and R buttock (area of R piriformis).   Self-Care   Self-Care --   Other Self-Care Comments  --   Neuro Re-ed    Neuro Re-ed Details  Pt transferred from sitting on mat table <> tall kneeling on mat on floor with mod I. In talll kneeling without UE support, pt performed lateral stepping x5 reps per direction, forward/retro stepping 2 x3 reps each; squats x4 reps. Tall kneeling exercises ended secondary to increased pain in R PSIS, R piriformis, R gluteus medius.   Exercises   Exercises Other Exercises   Other Exercises  Instructed pt in seated R piriformis stretch 2 x60-second holds to address pain in R piriformis. Pt repeated stretch x60 seconds at end of session without cueing for technique.                PT Education - 02/02/15 1949    Education provided Yes   Education Details Location of SI joint; how SI joint hypermobility causes pain, discomfort in piriformis.   Person(s) Educated Patient   Methods Other (comment);Explanation  illustration   Comprehension Verbalized understanding          PT Short Term Goals - 01/18/15 0910    PT SHORT TERM GOAL #1   Title Pt will perform home exercises with mod I using paper handout to indicate safe HEP compliance and to maximize functional gains. Target date: 02/11/15.   Baseline Initiated on 8/9.   Status On-going   PT SHORT TERM GOAL #2   Title Pt/wife will report consistent participation in home walking program (3-5 days/week at minimum) to facilitate improved endurance, activity tolerance. Target  date: 02/11/15.   Baseline Initiated on 8/9.   Status On-going   PT SHORT TERM GOAL #3   Title Pt will increase 6 Minute Walk distance from 693' to 750' to indicate progress towrd significant improvement in functional endurance. Target date: 02/11/15.   Status On-going  PT SHORT TERM GOAL #4   Title Pt will negotiate 12 stairs with 2 rails and mod I to progress toward pt ability to safely access second floor of home without wife providing supervision/assistance. Target date: 02/11/15.   Status New   PT SHORT TERM GOAL #5   Title Perform Berg Balance Scale to assess/address functional standing balance and fall risk. Target date: 02/11/15.   Baseline Scored 39/56 on 8/9.   Status Achieved           PT Long Term Goals - 01/18/15 0911    PT LONG TERM GOAL #1   Title Pt will increase 6 Minute Walk Test distance from 693' to 806' to indicate significant improvement in functional endurance. Target date: 03/11/15   Status On-going   PT LONG TERM GOAL #2   Title Pt/wife will verbalize understanding of safe fitness regimen for gym progress toward pt-stated goal of being able to safely exercise at gym. Target date: 03/11/15   Status On-going   PT LONG TERM GOAL #3   Title Pt will report no more than 2-point increase in within-session pain rating to indicate ability to perform functional mobility without limitation of pain. Target date: 03/11/15   Status On-going   PT LONG TERM GOAL #4   Title Pt will increase Berg Balance score by 8 points from baseline to indicate significant improvement in functional standing balance. Target date: 03/11/15   Baseline 8/9: baseline Berg score = 39/56   Status On-going   PT LONG TERM GOAL #5   Title Pt/wife will verbalize understanding of CVA warning signs to indicate understanding of situations in which to seek medical attention. Target date: 03/11/15   Status On-going   PT LONG TERM GOAL #6   Title Pt will ambulate > 200' over level surfaces with mod I to indicate  increased stability/independence with household mobility. Target date: 03/11/15   Status On-going   PT LONG TERM GOAL #7   Title Pt will ambulate > 300' over unlevel, paved surfaces with mod I using LRAF to indicate increased safety/independence with community mobility, return to PLOF. Target date: 03/11/15   Status On-going   PT LONG TERM GOAL #8   Title Pt will negotiate standard ramp and curb with mod I using LRAD to indicate increased safety/independence negotiating community obstacles, return to PLOF. Target date: 03/11/15   Status On-going   PT LONG TERM GOAL  #9   TITLE Pt will negotiate 26 stairs with 2 rails requiring single standing rest break (< 1 minute duration) with mod I, increased time to enable pt to safely access second floor of home. Target date: 03/11/15   Status On-going               Plan - 02/02/15 2010    Clinical Impression Statement Continued to focus on proximal stability/control to increase gait stability and increase pt tolerance to ambulation. Pt continues to demonstrate excessive lateral movement of pelvis during R > L stance phase of gait. Suspect this is due to hip weakness (R Trendelenburg), which may be attributing to lower back/buttock pain, as pelvic movement appears to be placing a considerable loading of lumbar spine. Plan to continue to focus on core/hip strengthening/stability. Continue per POC.    Pt will benefit from skilled therapeutic intervention in order to improve on the following deficits Abnormal gait;Decreased endurance;Decreased activity tolerance;Decreased balance;Decreased cognition;Decreased mobility;Decreased strength;Decreased safety awareness;Decreased knowledge of use of DME   Rehab Potential Good   PT Frequency 2x /  week   PT Duration 8 weeks   PT Treatment/Interventions ADLs/Self Care Home Management;Functional mobility training;Stair training;Gait training;DME Instruction;Therapeutic activities;Therapeutic exercise;Balance  training;Neuromuscular re-education;Patient/family education;Orthotic Fit/Training;Vestibular;Manual techniques   PT Next Visit Plan Core activation, proximal stability/control, single limb stance stability (focus on RLE), breathing technques during mobility.   Consulted and Agree with Plan of Care Patient        Problem List Patient Active Problem List   Diagnosis Date Noted  . 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 CVA (cerebral infarction) 12/31/2014  . Right hand weakness 12/31/2014  . TIA (transient ischemic attack) 12/31/2014  . Advanced directives, counseling/discussion 04/19/2014  . Spinal stenosis, lumbar region, with neurogenic claudication 12/23/2012  . Osteoarthritis, multiple sites 10/08/2012  . Routine general medical examination at a health care facility 02/25/2012  . NSTEMI (non-ST elevated myocardial infarction) 12/28/2011  . Chronic diastolic HF (heart failure) 12/28/2011    Class: Acute  . Frequent falls 12/28/2011  . Allergic rhinitis 11/11/2008  . Hyperlipemia 04/14/2008  . Anemia 04/14/2008  . Episodic mood disorder 04/14/2008  . Essential hypertension 04/14/2008  . Coronary atherosclerosis 04/14/2008  . Atrial fibrillation 04/14/2008  . COPD (chronic obstructive pulmonary disease) with emphysema 04/14/2008   Billie Ruddy, PT, DPT [facility] [address] [place], [place], [address] Phone: [phone]   Fax:  [phone] 02/02/2015, 8:25 PM

## 2015-02-02 NOTE — Patient Instructions (Signed)
Coordination for right hand "In Hand manipulation"  Use 2 golf balls and rotate them against each other in your hand.  Rotate clockwise 1 min Rest Rotate counter-clockwise 1 min

## 2015-02-03 ENCOUNTER — Encounter: Payer: PPO | Admitting: Occupational Therapy

## 2015-02-04 ENCOUNTER — Ambulatory Visit: Payer: PPO | Admitting: Physical Therapy

## 2015-02-04 ENCOUNTER — Ambulatory Visit: Payer: PPO

## 2015-02-04 DIAGNOSIS — G8191 Hemiplegia, unspecified affecting right dominant side: Secondary | ICD-10-CM | POA: Diagnosis not present

## 2015-02-04 DIAGNOSIS — R41841 Cognitive communication deficit: Secondary | ICD-10-CM

## 2015-02-04 NOTE — Patient Instructions (Signed)
We will begin speech therapy focusing on linguistic thinking skills at higher levels twice a week.

## 2015-02-04 NOTE — Therapy (Signed)
[facility] [address] [place], [place], [address] Phone: [phone]   Fax:  [phone]  Speech Language Pathology Evaluation  Patient Details  Name: Eduardo [name] MRN: [id number] Date of Birth: 1930-12-06 Referring Provider:  Venia Carbon, MD  Encounter Date: 02/04/2015      End of Session - 02/04/15 1134    Visit Number 1   Number of Visits 16   Date for SLP Re-Evaluation 04/05/15   Authorization Type G - CODE!!!   SLP Start Time 27   SLP Stop Time  1102   SLP Time Calculation (min) 43 min   Activity Tolerance Patient tolerated treatment well      Past Medical History  Diagnosis Date  . Anemia   . Anxiety   . Atrial fibrillation   . CAD (coronary artery disease)   . Hyperlipidemia   . COPD (chronic obstructive pulmonary disease)   . Allergy   . Osteoarthritis   . Hx of colonic polyps   . Hyperplastic polyp of intestine 2001  . Adenomatous polyp 2001  . Hypertension 12/25/11    pt denies this history  . Myocardial infarction 06/2010    "twice; 1 day apart; during/after cath"  . Exertional dyspnea   . S/P appy   . Arrhythmia     Past Surgical History  Procedure Laterality Date  . Appendectomy    . Carpal tunnel release      left  . Mastoidectomy      x3 on right  . Pilonidal cyst / sinus excision    . Knee arthroscopy      left, right knee 10/2009  . Shoulder arthroscopy      right  . Foot fracture surgery      right heel repair  . Nasal septum surgery      nasal septal repair  . Penile prosthesis implant      x 2--got infection after 2nd and had to remove  . Cataract extraction w/ intraocular lens implant  2/13    Dr [name]  . Carpal tunnel release  10/11/11    Dr Rush Barer  . Tonsillectomy and adenoidectomy    . Fracture surgery    . Cardiac catheterization  2012    50% LAD and luminal irreg in RCA, 1/12  Post cath MI from damage  . Penile prosthesis   removal      There were no vitals filed for this visit.  Visit Diagnosis: Cognitive communication deficit      Subjective Assessment - 02/04/15 1029    Subjective "I'm more fatigued." (Differences since CVA)            SLP Evaluation [facility] - 02/04/15 1030    SLP Visit Information   SLP Received On 02/04/15   Onset Date Pt could not recall "3 maybe 4 weeks ago"   Medical Diagnosis CVA   Pain Assessment   Currently in Pain? Yes   Pain Score 2    Pain Location Back   Pain Orientation Right;Left;Lower   Pain Type Chronic pain   Pain Onset More than a month ago   Pain Frequency Constant   Pain Relieving Factors meds, stretching   Effect of Pain on Daily Activities limits my movement   Prior Functional Status   Cognitive/Linguistic Baseline Within functional limits   Type of Home House    Lives With Spouse   Vocation Retired   Associate Professor   Overall Cognitive Status Impaired/Different from baseline  Attention Alternating;Divided   Selective Attention --   Selective Attention Impairment Verbal complex   Alternating Attention Impaired   Alternating Attention Impairment Functional complex;Verbal complex   Divided Attention Impaired   Divided Attention Impairment Functional complex;Verbal complex   Awareness Impaired  possible impairment    Awareness Impairment Emergent impairment  pt did not correct mistakes on eval tasks 33% of the time   Problem Solving Impaired   Problem Solving Impairment Verbal complex  possibly exacerbated by decr'd attention   Auditory Comprehension   Overall Auditory Comprehension Appears within functional limits for tasks assessed   Verbal Expression   Overall Verbal Expression Appears within functional limits for tasks assessed   Oral Motor/Sensory Function   Overall Oral Motor/Sensory Function Appears within functional limits for tasks assessed   Overall Oral Motor/Sensory Function pt with anterior saliva leakage from lt labial margin    Motor Speech   Overall Motor Speech Appears within functional limits for tasks assessed     Pt's score on the Self Adminstered Gerocognitive Evaluation was 16/22 (scores above 17 are considered WNL). Pt had difficulty with mental math ("How many quarters are in $6.75?"), clock drawing (face too small to place all numbers legibly, wrong hand configuration, numbers outside of correct quadrants), and short term memory. Goals will be added to current POC as necessary after initiation of therapy.                    SLP Education - 2015/02/18 1133    Education provided Yes   Education Details deficits in cognitive-linguistics (attention - demo'd by slowed processing time)   Person(s) Educated Patient   Methods Explanation   Comprehension Verbalized understanding          SLP Short Term Goals - 02/18/2015 1140    SLP SHORT TERM GOAL #1   Title pt will demo alternating attention skills in mod complex cognitive-linguistic tasks with 95% success on both tasks over 3 sessions   Time 4   Period Weeks   Status New   SLP SHORT TERM GOAL #2   Title pt will demo emergent awareness demo'd by correcting mod complex cognitive linguistic tasks 100% of the time during task, over two therapy sessions   Time 4   Period Weeks   Status New          SLP Long Term Goals - 2015/02/18 1141    SLP LONG TERM GOAL #1   Title pt will demo divided attention in functional cognitive-linguistic tasks with 90% success each task over two sessions   Time 8   Period Weeks   Status New   SLP LONG TERM GOAL #2   Title pt will demo anticipatory awareness and review cognitive linguistic therapy tasks 100% of the time in 3 therapy sessions   Time 8   Period Weeks   Status New          Plan - 02/18/2015 1135    Clinical Impression Statement Pt presents with deficits in higher level cognitive-linguistic abilities such as attention. Skilled ST needed to maximize pt's potential in these areas.   Speech  Therapy Frequency 2x / week   Duration --  8 weeks   Treatment/Interventions Patient/family education;Compensatory strategies;Internal/external aids;Cognitive reorganization;SLP instruction and feedback;Functional tasks;Cueing hierarchy   Potential to Achieve Goals Good          G-Codes - 2015-02-18 1143    Functional Assessment Tool Used noms approx 30%   Functional Limitations Attention  Attention Current Status [phone]) At least 20 percent but less than 40 percent impaired, limited or restricted   Attention Goal Status [id number]) At least 1 percent but less than 20 percent impaired, limited or restricted      Problem List Patient Active Problem List   Diagnosis Date Noted  . 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 CVA (cerebral infarction) 12/31/2014  . Right hand weakness 12/31/2014  . TIA (transient ischemic attack) 12/31/2014  . Advanced directives, counseling/discussion 04/19/2014  . Spinal stenosis, lumbar region, with neurogenic claudication 12/23/2012  . Osteoarthritis, multiple sites 10/08/2012  . Routine general medical examination at a health care facility 02/25/2012  . NSTEMI (non-ST elevated myocardial infarction) 12/28/2011  . Chronic diastolic HF (heart failure) 12/28/2011    Class: Acute  . Frequent falls 12/28/2011  . Allergic rhinitis 11/11/2008  . Hyperlipemia 04/14/2008  . Anemia 04/14/2008  . Episodic mood disorder 04/14/2008  . Essential hypertension 04/14/2008  . Coronary atherosclerosis 04/14/2008  . Atrial fibrillation 04/14/2008  . COPD (chronic obstructive pulmonary disease) with emphysema 04/14/2008    [facility] , MS, CCC-SLP    02/04/2015, 12:22 PM  [facility] [address] [place], [place], [address] Phone: [phone]   Fax:  [phone]

## 2015-02-08 ENCOUNTER — Encounter: Payer: Self-pay | Admitting: Physical Therapy

## 2015-02-08 ENCOUNTER — Ambulatory Visit: Payer: PPO | Admitting: Physical Therapy

## 2015-02-08 ENCOUNTER — Encounter: Payer: Self-pay | Admitting: Occupational Therapy

## 2015-02-08 ENCOUNTER — Ambulatory Visit: Payer: PPO

## 2015-02-08 ENCOUNTER — Ambulatory Visit: Payer: PPO | Admitting: Occupational Therapy

## 2015-02-08 DIAGNOSIS — R2681 Unsteadiness on feet: Secondary | ICD-10-CM

## 2015-02-08 DIAGNOSIS — R41841 Cognitive communication deficit: Secondary | ICD-10-CM

## 2015-02-08 DIAGNOSIS — R531 Weakness: Secondary | ICD-10-CM

## 2015-02-08 DIAGNOSIS — G8191 Hemiplegia, unspecified affecting right dominant side: Secondary | ICD-10-CM

## 2015-02-08 DIAGNOSIS — IMO0002 Reserved for concepts with insufficient information to code with codable children: Secondary | ICD-10-CM

## 2015-02-08 DIAGNOSIS — R269 Unspecified abnormalities of gait and mobility: Secondary | ICD-10-CM

## 2015-02-08 NOTE — Patient Instructions (Signed)
  Please complete the assigned speech therapy homework and return it to your next session.  

## 2015-02-08 NOTE — Therapy (Signed)
[facility] [address] [place], [place], [address] Phone: [phone]   Fax:  [phone]  Speech Language Pathology Treatment  Patient Details  Name: Eduardo [name] MRN: [id number] Date of Birth: July 11, 1930 Referring Provider:  Venia Carbon, MD  Encounter Date: 02/08/2015      End of Session - 02/08/15 1716    Visit Number 2   Number of Visits 16   Date for SLP Re-Evaluation 04/05/15   SLP Start Time 1104   SLP Stop Time  1146   SLP Time Calculation (min) 42 min   Activity Tolerance Patient tolerated treatment well      Past Medical History  Diagnosis Date  . Anemia   . Anxiety   . Atrial fibrillation   . CAD (coronary artery disease)   . Hyperlipidemia   . COPD (chronic obstructive pulmonary disease)   . Allergy   . Osteoarthritis   . Hx of colonic polyps   . Hyperplastic polyp of intestine 2001  . Adenomatous polyp 2001  . Hypertension 12/25/11    pt denies this history  . Myocardial infarction 06/2010    "twice; 1 day apart; during/after cath"  . Exertional dyspnea   . S/P appy   . Arrhythmia     Past Surgical History  Procedure Laterality Date  . Appendectomy    . Carpal tunnel release      left  . Mastoidectomy      x3 on right  . Pilonidal cyst / sinus excision    . Knee arthroscopy      left, right knee 10/2009  . Shoulder arthroscopy      right  . Foot fracture surgery      right heel repair  . Nasal septum surgery      nasal septal repair  . Penile prosthesis implant      x 2--got infection after 2nd and had to remove  . Cataract extraction w/ intraocular lens implant  2/13    Dr [name]  . Carpal tunnel release  10/11/11    Dr Rush Barer  . Tonsillectomy and adenoidectomy    . Fracture surgery    . Cardiac catheterization  2012    50% LAD and luminal irreg in RCA, 1/12  Post cath MI from damage  . Penile prosthesis  removal      There were no vitals  filed for this visit.  Visit Diagnosis: Cognitive communication deficit      Subjective Assessment - 02/08/15 1108    Subjective Pt stretched this morning and does not feel pain like he did last week.               ADULT SLP TREATMENT - 02/08/15 1108    General Information   Behavior/Cognition Cooperative;Pleasant mood;Distractible   Treatment Provided   Treatment provided Cognitive-Linquistic   Pain Assessment   Pain Assessment No/denies pain   Cognitive-Linquistic Treatment   Treatment focused on Cognition   Skilled Treatment SLP facilitated pt's cognitive linguistic skills in attention by targeting alternating attention in  written and auditory means. Pt 84% with written and 100% with self correction with auditory. SLP discussed performing detailed tasks at home (e.g, meds ) and the need to double-check performance. Pt showed decr'd emergent awareness as he did not ID errors when SLP suggested he double-check his work.   Assessment / Recommendations / Plan   Plan Continue with current plan of care   Progression Toward Goals   Progression  toward goals Progressing toward goals          SLP Education - 02/08/15 1716    Education provided Yes   Education Details need to double check detailed work   Forensic psychologist) Educated Patient   Methods Explanation   Comprehension Verbalized understanding          SLP Short Term Goals - 02/08/15 1718    SLP SHORT TERM GOAL #1   Title pt will demo alternating attention skills in mod complex cognitive-linguistic tasks with 95% success on both tasks over 3 sessions   Time 4   Period Weeks   Status On-going   SLP SHORT TERM GOAL #2   Title pt will demo emergent awareness demo'd by correcting mod complex cognitive linguistic tasks 100% of the time during task, over two therapy sessions   Time 4   Period Weeks   Status On-going          SLP Long Term Goals - 02/08/15 1718    SLP LONG TERM GOAL #1   Title pt will demo divided  attention in functional cognitive-linguistic tasks with 90% success each task over two sessions   Time 8   Period Weeks   Status On-going   SLP LONG TERM GOAL #2   Title pt will demo anticipatory awareness and review cognitive linguistic therapy tasks 100% of the time in 3 therapy sessions   Time 8   Period Weeks   Status On-going          Plan - 02/08/15 1717    Clinical Impression Statement Pt with skilled ST cont necessary to maximize higher level cognitive linguistic skills like alternating and divided attention and emergent awareness.   Speech Therapy Frequency 2x / week   Duration --  8 weeks   Treatment/Interventions Patient/family education;Compensatory strategies;Internal/external aids;Cognitive reorganization;SLP instruction and feedback;Functional tasks;Cueing hierarchy   Potential to Achieve Goals Good        Problem List Patient Active Problem List   Diagnosis Date Noted  . 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 CVA (cerebral infarction) 12/31/2014  . Right hand weakness 12/31/2014  . TIA (transient ischemic attack) 12/31/2014  . Advanced directives, counseling/discussion 04/19/2014  . Spinal stenosis, lumbar region, with neurogenic claudication 12/23/2012  . Osteoarthritis, multiple sites 10/08/2012  . Routine general medical examination at a health care facility 02/25/2012  . NSTEMI (non-ST elevated myocardial infarction) 12/28/2011  . Chronic diastolic HF (heart failure) 12/28/2011    Class: Acute  . Frequent falls 12/28/2011  . Allergic rhinitis 11/11/2008  . Hyperlipemia 04/14/2008  . Anemia 04/14/2008  . Episodic mood disorder 04/14/2008  . Essential hypertension 04/14/2008  . Coronary atherosclerosis 04/14/2008  . Atrial fibrillation 04/14/2008  . COPD (chronic obstructive pulmonary disease) with emphysema 04/14/2008    [facility] , MS, CCC-SLP  02/08/2015, 5:19 PM  [facility] [address] [place], [place], [address] Phone: [phone]   Fax:  [phone]

## 2015-02-08 NOTE — Therapy (Signed)
[facility] [address] [place], [place], [address] Phone: [phone]   Fax:  [phone]  Occupational Therapy Treatment  Patient Details  Name: Eduardo [name] MRN: [id number] Date of Birth: 1931/03/15 Referring Provider:  Venia Carbon, MD  Encounter Date: 02/08/2015      OT End of Session - 02/08/15 1408    Visit Number 6   Number of Visits 16   Date for OT Re-Evaluation 03/11/15   Authorization Type [employer] - assume medicare managed plan and will need G code!!!   Authorization Time Period 01/14/2015-03/11/2015   Authorization - Visit Number 6   Authorization - Number of Visits 10   OT Start Time 1315   OT Stop Time 1400   OT Time Calculation (min) 45 min   Activity Tolerance Patient tolerated treatment well      Past Medical History  Diagnosis Date  . Anemia   . Anxiety   . Atrial fibrillation   . CAD (coronary artery disease)   . Hyperlipidemia   . COPD (chronic obstructive pulmonary disease)   . Allergy   . Osteoarthritis   . Hx of colonic polyps   . Hyperplastic polyp of intestine 2001  . Adenomatous polyp 2001  . Hypertension 12/25/11    pt denies this history  . Myocardial infarction 06/2010    "twice; 1 day apart; during/after cath"  . Exertional dyspnea   . S/P appy   . Arrhythmia     Past Surgical History  Procedure Laterality Date  . Appendectomy    . Carpal tunnel release      left  . Mastoidectomy      x3 on right  . Pilonidal cyst / sinus excision    . Knee arthroscopy      left, right knee 10/2009  . Shoulder arthroscopy      right  . Foot fracture surgery      right heel repair  . Nasal septum surgery      nasal septal repair  . Penile prosthesis implant      x 2--got infection after 2nd and had to remove  . Cataract extraction w/ intraocular lens implant  2/13    Dr [name]  . Carpal tunnel release  10/11/11    Dr Rush Barer  .  Tonsillectomy and adenoidectomy    . Fracture surgery    . Cardiac catheterization  2012    50% LAD and luminal irreg in RCA, 1/12  Post cath MI from damage  . Penile prosthesis  removal      There were no vitals filed for this visit.  Visit Diagnosis:  Hemiplegia affecting right dominant side  Lack of coordination due to stroke  Weakness generalized      Subjective Assessment - 02/08/15 1318    Pertinent History see epic snapshot   Patient Stated Goals get my hand working better   Currently in Pain? Yes   Pain Score 2    Pain Location Back   Pain Orientation Lower   Pain Descriptors / Indicators Aching   Pain Type Chronic pain   Pain Onset More than a month ago   Pain Frequency Intermittent   Aggravating Factors  standing   Pain Relieving Factors meds, stretching   Effect of Pain on Daily Activities limits my movement                      OT Treatments/Exercises (OP) - 02/08/15 0001  ADLs   Writing Writing 2 sentences at approx. 70% legibility in cursive with regular pen   ADL Comments Pinch strength: lateral = 11 lbs, tip = 9 lbs, 3 jaw = 10 lbs. Assessed more STG's today and progress to date   Hand Exercises   Other Hand Exercises Pinch strength: using lateral pinch and 3 jaw pinch to retrieve and replace clothespins on antenna to increase overall pinch strength.    Fine Motor Coordination   Other Fine Motor Exercises practiced in hand manipulation (fingertips to palm and back) manipulating 5 coins at a time with min drops/difficulty. Emphasized certain coordination exercises that help with intrinsic mvmt including: rotating ball, manipulating coins in hand, and dealing cards with thumb. Also encouraged pt to continue at home as well as practice writing at home.                   OT Short Term Goals - 02/08/15 1321    OT SHORT TERM GOAL #1   Title Pt will be mod I with HEP - 02/11/2115   Status Achieved   OT SHORT TERM GOAL #2   Title Pt  will demonstrate improved grip strenth in R hand by at least 5 pounds (baseline=45 pounds) to assist with functional tasks   Status Achieved  60 lbs   OT SHORT TERM GOAL #3   Title Pt will demonstrate improved pinch strength R hand by at least 2 pounds combined (baseline = 31) to assist with functional tasks   Status On-going   OT SHORT TERM GOAL #4   Title Pt will be mod I with cutting meat, AE prn   Status Achieved   OT SHORT TERM GOAL #5   Title Pt will demonstrate mod I with selective attention for basic familiar functional tasks   Status On-going   OT SHORT TERM GOAL #6   Title Pt will complete at least 10 minutes of functional activity without rest breaks   Status Achieved  per pt report   OT SHORT TERM GOAL #7   Title Pt will demonstrate ability to write at 2 sentence level with 80% legibility with AE prn   Status On-going           OT Long Term Goals - 01/31/15 1302    OT LONG TERM GOAL #1   Title Pt will be mod I with upgraded HEP- 03/11/2015   Status On-going   OT LONG TERM GOAL #2   Title Pt will demonstrate mod I with alternating attention with basic familiar functional tasks   Status On-going   OT LONG TERM GOAL #3   Title Pt will demonstrate improved grip strength in R hand by at least 8 pounds (baseline= 45) to assist with functional tasks   Status On-going   OT LONG TERM GOAL #4   Title Pt will be mod I with simple home mgmt tasks   Status On-going   OT LONG TERM GOAL #5   Title Pt will be able to complete 20 minutes of activity without rest break   Status On-going   Long Term Additional Goals   Additional Long Term Goals Yes   OT LONG TERM GOAL #6   Title Pt will demonstrate improved coordination as evidenced by decreasing time on 9 hole peg test by at least 10 seconds (baseline = 53.43) to assist with functional tasks   Status On-going   OT LONG TERM GOAL #7   Title Pt will demonstrate ability to write at  3 sentence level with 100% legibility with  AE  prn   Status New               Plan - 02/08/15 1409    Clinical Impression Statement Pt met STG's #1, #4, and #6 today. Pt with improved handwriting since last week.    Plan practice tracing letters for control, free writing, pinch and grip strength   Consulted and Agree with Plan of Care Patient        Problem List Patient Active Problem List   Diagnosis Date Noted  . 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 CVA (cerebral infarction) 12/31/2014  . Right hand weakness 12/31/2014  . TIA (transient ischemic attack) 12/31/2014  . Advanced directives, counseling/discussion 04/19/2014  . Spinal stenosis, lumbar region, with neurogenic claudication 12/23/2012  . Osteoarthritis, multiple sites 10/08/2012  . Routine general medical examination at a health care facility 02/25/2012  . NSTEMI (non-ST elevated myocardial infarction) 12/28/2011  . Chronic diastolic HF (heart failure) 12/28/2011    Class: Acute  . Frequent falls 12/28/2011  . Allergic rhinitis 11/11/2008  . Hyperlipemia 04/14/2008  . Anemia 04/14/2008  . Episodic mood disorder 04/14/2008  . Essential hypertension 04/14/2008  . Coronary atherosclerosis 04/14/2008  . Atrial fibrillation 04/14/2008  . COPD (chronic obstructive pulmonary disease) with emphysema 04/14/2008    Carey Bullocks, OTR/L 02/08/2015, 2:11 PM  [facility] [address] [place], [place], [address] Phone: [phone]   Fax:  [phone]

## 2015-02-08 NOTE — Therapy (Signed)
[facility] [address] [place], [place], [address] Phone: [phone]   Fax:  [phone]  Physical Therapy Treatment  Patient Details  Name: Eduardo [name] MRN: [id number] Date of Birth: 05/11/1931 Referring Provider:  Venia Carbon, MD  Encounter Date: 02/08/2015      PT End of Session - 02/08/15 1531    Visit Number 7   Number of Visits 17   Date for PT Re-Evaluation 03/15/15   PT Start Time 1400   PT Stop Time 1443   PT Time Calculation (min) 43 min   Activity Tolerance Patient tolerated treatment well   Behavior During Therapy [facility] for tasks assessed/performed      Past Medical History  Diagnosis Date  . Anemia   . Anxiety   . Atrial fibrillation   . CAD (coronary artery disease)   . Hyperlipidemia   . COPD (chronic obstructive pulmonary disease)   . Allergy   . Osteoarthritis   . Hx of colonic polyps   . Hyperplastic polyp of intestine 2001  . Adenomatous polyp 2001  . Hypertension 12/25/11    pt denies this history  . Myocardial infarction 06/2010    "twice; 1 day apart; during/after cath"  . Exertional dyspnea   . S/P appy   . Arrhythmia     Past Surgical History  Procedure Laterality Date  . Appendectomy    . Carpal tunnel release      left  . Mastoidectomy      x3 on right  . Pilonidal cyst / sinus excision    . Knee arthroscopy      left, right knee 10/2009  . Shoulder arthroscopy      right  . Foot fracture surgery      right heel repair  . Nasal septum surgery      nasal septal repair  . Penile prosthesis implant      x 2--got infection after 2nd and had to remove  . Cataract extraction w/ intraocular lens implant  2/13    Dr [name]  . Carpal tunnel release  10/11/11    Dr Rush Barer  . Tonsillectomy and adenoidectomy    . Fracture surgery    . Cardiac catheterization  2012    50% LAD and luminal irreg in RCA, 1/12  Post cath MI from damage  . Penile  prosthesis  removal      There were no vitals filed for this visit.  Visit Diagnosis:  Abnormality of gait  Unsteadiness  Weakness generalized  Hemiplegia affecting right dominant side      Subjective Assessment - 02/08/15 1406    Subjective Pt reporting pain in back has started going down B LEs.  Reports not ambulating long distances due to increased pain.    Pertinent History Chronic atrial fibrillation, combined systolic and diastolic heart failure, CAD, HTN. * Notify cardiologist if HR < 50 bpm   Limitations House hold activities;Walking   Patient Stated Goals "Improve my endurance, get better balance, walk like I used to, and get back to my regimen."   Currently in Pain? Yes   Pain Score 3    Pain Location Back  radiating to LEs.    Pain Orientation Lower   Pain Descriptors / Indicators Aching;Radiating   Pain Type Chronic pain   Pain Onset More than a month ago   Pain Frequency Intermittent   Aggravating Factors  standing and walking   Pain Relieving Factors meds, stretching, rest  [facility] PT Assessment - 02/08/15 1412    6 Minute walk- Post Test   6 Minute Walk Post Test yes   BP (mmHg) 186/86 mmHg  172/83 following 3 mins of rest   HR (bpm) 81   02 Sat (%RA) 99 %   Modified Borg Scale for Dyspnea 3- Moderate shortness of breath or breathing difficulty   6 minute walk test results    Aerobic Endurance Distance Walked 948   Endurance additional comments Continue to note Trendelenburg during most of 6MWT and no pain following 6MWT.                       [facility] PT Treatment/Exercise - 02/08/15 1412    Transfers   Transfers Sit to Stand;Stand to Sit   Sit to Stand 6: Modified independent (Device/Increase time)   Stand to Sit 6: Modified independent (Device/Increase time)   Ambulation/Gait   Ambulation/Gait Yes   Ambulation/Gait Assistance 5: Supervision;6: Modified independent (Device/Increase time)   Ambulation Distance (Feet)  948 Feet  w/ 6MWT   Assistive device Straight cane   Gait Pattern Step-through pattern;Decreased step length - left;Decreased stance time - right;Right flexed knee in stance;Trendelenburg;Decreased trunk rotation;Trunk flexed  R Trendelenberg with increased fatigue during 6MWT   Gait Comments Performed 6MWT, note heavy Trendelenburg during second half of test as well as R toe drag, esp when negotiating obstacles.     Neuro Re-ed    Neuro Re-ed Details  --   Exercises   Exercises Other Exercises   Other Exercises  Performed HEP as follows; red tband resisted hip abd/ext 15 reps BLE, SLS in corner 2x 10 secs per side, piriformis stretch BLE x 30 secs with cues for technique and safety                PT Education - 02/08/15 1409    Education provided Yes   Education Details educated on re-implementing walking program, stretching prior to ambulation, progress towards goals, education to assess BP and notify MD if remains elevated   Person(s) Educated Patient   Methods Explanation;Demonstration   Comprehension Returned demonstration;Verbalized understanding          PT Short Term Goals - 02/08/15 1422    PT SHORT TERM GOAL #1   Title Pt will perform home exercises with mod I using paper handout to indicate safe HEP compliance and to maximize functional gains. Target date: 02/11/15.   Baseline Still needs cues for technique on all exercises, will go over again next visit.    Status Not Met   PT SHORT TERM GOAL #2   Title Pt/wife will report consistent participation in home walking program (3-5 days/week at minimum) to facilitate improved endurance, activity tolerance. Target date: 02/11/15.   Baseline Pt reports that increased walking causes increased pain. Pt reports he will begin to attempt again.    Status Not Met   PT SHORT TERM GOAL #3   Title Pt will increase 6 Minute Walk distance from 693' to 750' to indicate progress towrd significant improvement in functional endurance.  Target date: 02/11/15.   Baseline 948' on 8/30   Status Achieved   PT SHORT TERM GOAL #4   Title Pt will negotiate 12 stairs with 2 rails and mod I to progress toward pt ability to safely access second floor of home without wife providing supervision/assistance. Target date: 02/11/15.   Baseline met 8/30   Status Achieved   PT SHORT TERM GOAL #5  Title Perform Berg Balance Scale to assess/address functional standing balance and fall risk. Target date: 02/11/15.   Baseline Scored 39/56 on 8/9.   Status Achieved           PT Long Term Goals - 02/08/15 1445    PT LONG TERM GOAL #1   Title Pt will increase 6 Minute Walk Test distance from 693' to 806' to indicate significant improvement in functional endurance. Target date: 03/11/15   Baseline 948' on 8/30    Status Achieved   PT LONG TERM GOAL #2   Title Pt/wife will verbalize understanding of safe fitness regimen for gym progress toward pt-stated goal of being able to safely exercise at gym. Target date: 03/11/15   Status On-going   PT LONG TERM GOAL #3   Title Pt will report no more than 2-point increase in within-session pain rating to indicate ability to perform functional mobility without limitation of pain. Target date: 03/11/15   Status On-going   PT LONG TERM GOAL #4   Title Pt will increase Berg Balance score by 8 points from baseline to indicate significant improvement in functional standing balance. Target date: 03/11/15   Baseline 8/9: baseline Berg score = 39/56   Status On-going   PT LONG TERM GOAL #5   Title Pt/wife will verbalize understanding of CVA warning signs to indicate understanding of situations in which to seek medical attention. Target date: 03/11/15   Status On-going   PT LONG TERM GOAL #6   Title Pt will ambulate > 200' over level surfaces with mod I to indicate increased stability/independence with household mobility. Target date: 03/11/15   Status On-going   PT LONG TERM GOAL #7   Title Pt will ambulate > 300'  over unlevel, paved surfaces with mod I using LRAF to indicate increased safety/independence with community mobility, return to PLOF. Target date: 03/11/15   Status On-going   PT LONG TERM GOAL #8   Title Pt will negotiate standard ramp and curb with mod I using LRAD to indicate increased safety/independence negotiating community obstacles, return to PLOF. Target date: 03/11/15   Status On-going   PT LONG TERM GOAL  #9   TITLE Pt will negotiate 26 stairs with 2 rails requiring single standing rest break (< 1 minute duration) with mod I, increased time to enable pt to safely access second floor of home. Target date: 03/11/15   Status On-going               Plan - 02/08/15 1532    Clinical Impression Statement Skilled session focused on assessing and checking off STG's.  Pt met 2/4 STG's as he has been limited by increased pain in low back from increased ambulation and requires cues for safe and appropriate carryover of HEP at this time.     Pt will benefit from skilled therapeutic intervention in order to improve on the following deficits Abnormal gait;Decreased endurance;Decreased activity tolerance;Decreased balance;Decreased cognition;Decreased mobility;Decreased strength;Decreased safety awareness;Decreased knowledge of use of DME   Rehab Potential Good   PT Frequency 2x / week   PT Duration 8 weeks   PT Treatment/Interventions ADLs/Self Care Home Management;Functional mobility training;Stair training;Gait training;DME Instruction;Therapeutic activities;Therapeutic exercise;Balance training;Neuromuscular re-education;Patient/family education;Orthotic Fit/Training;Vestibular;Manual techniques   PT Next Visit Plan Review HEP, core activation, proximal stability/control, SLS-focus on RLE, breathing techniques during mobility   Consulted and Agree with Plan of Care Patient        Problem List Patient Active Problem List   Diagnosis Date Noted  .  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 CVA (cerebral infarction) 12/31/2014  . Right hand weakness 12/31/2014  . TIA (transient ischemic attack) 12/31/2014  . Advanced directives, counseling/discussion 04/19/2014  . Spinal stenosis, lumbar region, with neurogenic claudication 12/23/2012  . Osteoarthritis, multiple sites 10/08/2012  . Routine general medical examination at a health care facility 02/25/2012  . NSTEMI (non-ST elevated myocardial infarction) 12/28/2011  . Chronic diastolic HF (heart failure) 12/28/2011    Class: Acute  . Frequent falls 12/28/2011  . Allergic rhinitis 11/11/2008  . Hyperlipemia 04/14/2008  . Anemia 04/14/2008  . Episodic mood disorder 04/14/2008  . Essential hypertension 04/14/2008  . Coronary atherosclerosis 04/14/2008  . Atrial fibrillation 04/14/2008  . COPD (chronic obstructive pulmonary disease) with emphysema 04/14/2008    Stefano Gaul 02/08/2015, 4:44 PM  [facility] [address] [place], [place], [address] Phone: [phone]   Fax:  [phone]  Documented by: Cameron Sprang, PT, MPT [facility] [address] [place], [place], [address] Phone: [phone]   Fax:  [phone] 02/08/2015, 4:45 PM

## 2015-02-10 ENCOUNTER — Ambulatory Visit: Payer: PPO | Attending: Internal Medicine

## 2015-02-10 ENCOUNTER — Ambulatory Visit: Payer: PPO

## 2015-02-10 ENCOUNTER — Telehealth: Payer: Self-pay | Admitting: Interventional Cardiology

## 2015-02-10 ENCOUNTER — Ambulatory Visit: Payer: PPO | Admitting: Occupational Therapy

## 2015-02-10 VITALS — HR 70

## 2015-02-10 DIAGNOSIS — R269 Unspecified abnormalities of gait and mobility: Secondary | ICD-10-CM | POA: Insufficient documentation

## 2015-02-10 DIAGNOSIS — Z7409 Other reduced mobility: Secondary | ICD-10-CM | POA: Insufficient documentation

## 2015-02-10 DIAGNOSIS — R279 Unspecified lack of coordination: Secondary | ICD-10-CM | POA: Diagnosis present

## 2015-02-10 DIAGNOSIS — R41841 Cognitive communication deficit: Secondary | ICD-10-CM | POA: Diagnosis present

## 2015-02-10 DIAGNOSIS — G8191 Hemiplegia, unspecified affecting right dominant side: Secondary | ICD-10-CM | POA: Insufficient documentation

## 2015-02-10 DIAGNOSIS — R531 Weakness: Secondary | ICD-10-CM | POA: Diagnosis present

## 2015-02-10 DIAGNOSIS — I6980 Unspecified sequelae of other cerebrovascular disease: Secondary | ICD-10-CM | POA: Diagnosis present

## 2015-02-10 DIAGNOSIS — M6281 Muscle weakness (generalized): Secondary | ICD-10-CM | POA: Insufficient documentation

## 2015-02-10 DIAGNOSIS — R2681 Unsteadiness on feet: Secondary | ICD-10-CM | POA: Diagnosis present

## 2015-02-10 DIAGNOSIS — R001 Bradycardia, unspecified: Secondary | ICD-10-CM

## 2015-02-10 DIAGNOSIS — IMO0002 Reserved for concepts with insufficient information to code with codable children: Secondary | ICD-10-CM

## 2015-02-10 NOTE — Telephone Encounter (Signed)
New message      Pt states his pulse this am was 40.  Do you think Dr Tamala Julian needs to change his medication?

## 2015-02-10 NOTE — Therapy (Signed)
[facility] [address] [place], [place], [address] Phone: [phone]   Fax:  [phone]  Physical Therapy Treatment  Patient Details  Name: Eduardo [name] MRN: [id number] Date of Birth: Jun 16, 1930 Referring Provider:  Venia Carbon, MD  Encounter Date: 02/10/2015      PT End of Session - 02/10/15 1537    Visit Number 8   Number of Visits 17   Date for PT Re-Evaluation 03/15/15   Authorization Type G-Code every 10th visit.   PT Start Time 1402   PT Stop Time 1445   PT Time Calculation (min) 43 min   Activity Tolerance Patient tolerated treatment well   Behavior During Therapy [facility] for tasks assessed/performed      Past Medical History  Diagnosis Date  . Anemia   . Anxiety   . Atrial fibrillation   . CAD (coronary artery disease)   . Hyperlipidemia   . COPD (chronic obstructive pulmonary disease)   . Allergy   . Osteoarthritis   . Hx of colonic polyps   . Hyperplastic polyp of intestine 2001  . Adenomatous polyp 2001  . Hypertension 12/25/11    pt denies this history  . Myocardial infarction 06/2010    "twice; 1 day apart; during/after cath"  . Exertional dyspnea   . S/P appy   . Arrhythmia     Past Surgical History  Procedure Laterality Date  . Appendectomy    . Carpal tunnel release      left  . Mastoidectomy      x3 on right  . Pilonidal cyst / sinus excision    . Knee arthroscopy      left, right knee 10/2009  . Shoulder arthroscopy      right  . Foot fracture surgery      right heel repair  . Nasal septum surgery      nasal septal repair  . Penile prosthesis implant      x 2--got infection after 2nd and had to remove  . Cataract extraction w/ intraocular lens implant  2/13    Dr [name]  . Carpal tunnel release  10/11/11    Dr Rush Barer  . Tonsillectomy and adenoidectomy    . Fracture surgery    . Cardiac catheterization  2012    50% LAD and luminal irreg in  RCA, 1/12  Post cath MI from damage  . Penile prosthesis  removal      Filed Vitals:   02/10/15 1406  Pulse: 70  SpO2: 98%    Visit Diagnosis:  Muscle weakness  Unsteadiness  Abnormality of gait      Subjective Assessment - 02/10/15 1406    Subjective Pt denied falls since last visit. Pt reported his HR was 40bpm at home this morning, he called and left a msg for his cardiologist to call him back.    Pertinent History Chronic atrial fibrillation, combined systolic and diastolic heart failure, CAD, HTN. * Notify cardiologist if HR < 50 bpm   Limitations House hold activities;Walking   Patient Stated Goals "Improve my endurance, get better balance, walk like I used to, and get back to my regimen."   Currently in Pain? Yes   Pain Score 2    Pain Location Buttocks   Pain Orientation Right;Left   Pain Descriptors / Indicators Aching   Pain Type Chronic pain   Pain Onset More than a month ago   Pain Frequency Intermittent   Aggravating Factors  walking  Pain Relieving Factors stretches       Therex: Pt performed and reviewed HEP, and progressed as appropriate. Pt continues to require cues for technique and to improve control/slow down. PT and pt discussed importance of walking program but did not ambulate during session. Please see pt instructions for details.  Neuro re-ed: 2 minutes Pt performed B SLS HEP. See pt instructions for details.                          PT Education - 02/10/15 1537    Education provided Yes   Education Details Pt reviewed, performed and progressed HEP as appropriate. PT reiterated the importance of informing MD regarding decreased HR this morning.   Person(s) Educated Patient   Methods Explanation;Tactile cues;Verbal cues;Handout   Comprehension Returned demonstration;Verbalized understanding;Need further instruction          PT Short Term Goals - 02/08/15 1422    PT SHORT TERM GOAL #1   Title Pt will perform home  exercises with mod I using paper handout to indicate safe HEP compliance and to maximize functional gains. Target date: 02/11/15.   Baseline Still needs cues for technique on all exercises, will go over again next visit.    Status Not Met   PT SHORT TERM GOAL #2   Title Pt/wife will report consistent participation in home walking program (3-5 days/week at minimum) to facilitate improved endurance, activity tolerance. Target date: 02/11/15.   Baseline Pt reports that increased walking causes increased pain. Pt reports he will begin to attempt again.    Status Not Met   PT SHORT TERM GOAL #3   Title Pt will increase 6 Minute Walk distance from 693' to 750' to indicate progress towrd significant improvement in functional endurance. Target date: 02/11/15.   Baseline 948' on 8/30   Status Achieved   PT SHORT TERM GOAL #4   Title Pt will negotiate 12 stairs with 2 rails and mod I to progress toward pt ability to safely access second floor of home without wife providing supervision/assistance. Target date: 02/11/15.   Baseline met 8/30   Status Achieved   PT SHORT TERM GOAL #5   Title Perform Berg Balance Scale to assess/address functional standing balance and fall risk. Target date: 02/11/15.   Baseline Scored 39/56 on 8/9.   Status Achieved           PT Long Term Goals - 02/08/15 1445    PT LONG TERM GOAL #1   Title Pt will increase 6 Minute Walk Test distance from 693' to 806' to indicate significant improvement in functional endurance. Target date: 03/11/15   Baseline 948' on 8/30    Status Achieved   PT LONG TERM GOAL #2   Title Pt/wife will verbalize understanding of safe fitness regimen for gym progress toward pt-stated goal of being able to safely exercise at gym. Target date: 03/11/15   Status On-going   PT LONG TERM GOAL #3   Title Pt will report no more than 2-point increase in within-session pain rating to indicate ability to perform functional mobility without limitation of pain. Target  date: 03/11/15   Status On-going   PT LONG TERM GOAL #4   Title Pt will increase Berg Balance score by 8 points from baseline to indicate significant improvement in functional standing balance. Target date: 03/11/15   Baseline 8/9: baseline Berg score = 39/56   Status On-going   PT LONG TERM GOAL #5  Title Pt/wife will verbalize understanding of CVA warning signs to indicate understanding of situations in which to seek medical attention. Target date: 03/11/15   Status On-going   PT LONG TERM GOAL #6   Title Pt will ambulate > 200' over level surfaces with mod I to indicate increased stability/independence with household mobility. Target date: 03/11/15   Status On-going   PT LONG TERM GOAL #7   Title Pt will ambulate > 300' over unlevel, paved surfaces with mod I using LRAF to indicate increased safety/independence with community mobility, return to PLOF. Target date: 03/11/15   Status On-going   PT LONG TERM GOAL #8   Title Pt will negotiate standard ramp and curb with mod I using LRAD to indicate increased safety/independence negotiating community obstacles, return to PLOF. Target date: 03/11/15   Status On-going   PT LONG TERM GOAL  #9   TITLE Pt will negotiate 26 stairs with 2 rails requiring single standing rest break (< 1 minute duration) with mod I, increased time to enable pt to safely access second floor of home. Target date: 03/11/15   Status On-going               Plan - 02/10/15 1539    Clinical Impression Statement Pt continues to require cues to improve technique and eccentric control. Pt demonstrated improved strength, as he was able to add resistance to clamshell HEP. Pt's HR ranged from 63-73bpm during session and SaO2 on room air remained >/=95% with no SOB, lightheadedness, nausea reported. Continue with POC.   Pt will benefit from skilled therapeutic intervention in order to improve on the following deficits Abnormal gait;Decreased endurance;Decreased activity  tolerance;Decreased balance;Decreased cognition;Decreased mobility;Decreased strength;Decreased safety awareness;Decreased knowledge of use of DME   Rehab Potential Good   PT Frequency 2x / week   PT Duration 8 weeks   PT Treatment/Interventions ADLs/Self Care Home Management;Functional mobility training;Stair training;Gait training;DME Instruction;Therapeutic activities;Therapeutic exercise;Balance training;Neuromuscular re-education;Patient/family education;Orthotic Fit/Training;Vestibular;Manual techniques   PT Next Visit Plan core activation, proximal stability/control, SLS-focus on RLE, breathing techniques during mobility   PT Home Exercise Plan Strengthening/flexibility/balance HEP   Consulted and Agree with Plan of Care Patient        Problem List Patient Active Problem List   Diagnosis Date Noted  . 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 CVA (cerebral infarction) 12/31/2014  . Right hand weakness 12/31/2014  . TIA (transient ischemic attack) 12/31/2014  . Advanced directives, counseling/discussion 04/19/2014  . Spinal stenosis, lumbar region, with neurogenic claudication 12/23/2012  . Osteoarthritis, multiple sites 10/08/2012  . Routine general medical examination at a health care facility 02/25/2012  . NSTEMI (non-ST elevated myocardial infarction) 12/28/2011  . Chronic diastolic HF (heart failure) 12/28/2011    Class: Acute  . Frequent falls 12/28/2011  . Allergic rhinitis 11/11/2008  . Hyperlipemia 04/14/2008  . Anemia 04/14/2008  . Episodic mood disorder 04/14/2008  . Essential hypertension 04/14/2008  . Coronary atherosclerosis 04/14/2008  . Atrial fibrillation 04/14/2008  . COPD (chronic obstructive pulmonary disease) with emphysema 04/14/2008    Anas Reister L 02/10/2015, 3:42 PM  [facility] [address] [place] [address] [place], [place], [address] Phone: [phone]   Fax:   [phone]     Geoffry Paradise, PT,DPT 02/10/2015 3:42 PM Phone: [phone] Fax: [phone]

## 2015-02-10 NOTE — Telephone Encounter (Signed)
Returned pt call. Pt wife sts that the pt was not home. She reports that this morning the pt checked his heart rate and it was 40 bpm. He rechecked it again and got 52,54 bpm. Pt did not have any complaints. Pt cks his bp regularly. Pt wife could not provide a reading,but sts that it is always good. Pt Verapamil was reduce at pt last o/v. Pt would like to know if it need to be reduced again. Adv pt I will fwd and update to Dr.Smith and call back with his recommendation.

## 2015-02-10 NOTE — Therapy (Signed)
[facility] [address] [place], [place], [address] Phone: [phone]   Fax:  [phone]  Occupational Therapy Treatment  Patient Details  Name: Eduardo [name] MRN: [id number] Date of Birth: 1931/02/26 Referring Provider:  Venia Carbon, MD  Encounter Date: 02/10/2015      OT End of Session - 02/10/15 1539    Visit Number 7   Number of Visits 16   Date for OT Re-Evaluation 03/11/15   Authorization Type [employer] - assume medicare managed plan and will need G code!!!   Authorization Time Period 01/14/2015-03/11/2015   Authorization - Visit Number 7   Authorization - Number of Visits 10   OT Start Time 1450   OT Stop Time 1530   OT Time Calculation (min) 40 min   Activity Tolerance Patient tolerated treatment well      Past Medical History  Diagnosis Date  . Anemia   . Anxiety   . Atrial fibrillation   . CAD (coronary artery disease)   . Hyperlipidemia   . COPD (chronic obstructive pulmonary disease)   . Allergy   . Osteoarthritis   . Hx of colonic polyps   . Hyperplastic polyp of intestine 2001  . Adenomatous polyp 2001  . Hypertension 12/25/11    pt denies this history  . Myocardial infarction 06/2010    "twice; 1 day apart; during/after cath"  . Exertional dyspnea   . S/P appy   . Arrhythmia     Past Surgical History  Procedure Laterality Date  . Appendectomy    . Carpal tunnel release      left  . Mastoidectomy      x3 on right  . Pilonidal cyst / sinus excision    . Knee arthroscopy      left, right knee 10/2009  . Shoulder arthroscopy      right  . Foot fracture surgery      right heel repair  . Nasal septum surgery      nasal septal repair  . Penile prosthesis implant      x 2--got infection after 2nd and had to remove  . Cataract extraction w/ intraocular lens implant  2/13    Dr [name]  . Carpal tunnel release  10/11/11    Dr Rush Barer  .  Tonsillectomy and adenoidectomy    . Fracture surgery    . Cardiac catheterization  2012    50% LAD and luminal irreg in RCA, 1/12  Post cath MI from damage  . Penile prosthesis  removal      There were no vitals filed for this visit.  Visit Diagnosis:  Lack of coordination due to stroke  Hemiplegia affecting right dominant side      Subjective Assessment - 02/10/15 1453    Pertinent History see epic snapshot   Patient Stated Goals get my hand working better   Currently in Pain? Yes   Pain Score 2    Pain Location Buttocks   Pain Orientation Right;Left   Pain Descriptors / Indicators Aching   Pain Type Chronic pain   Pain Onset More than a month ago   Pain Frequency Intermittent   Aggravating Factors  walking, standing   Pain Relieving Factors stretches                      OT Treatments/Exercises (OP) - 02/10/15 0001    ADLs   Writing Pre-writing ex's including: tracing shapes (triangles, circles), tracing  letters, and distal finger control worksheet with mild difficulty. Pt writing  2 setences in cursive with 85-90% legibility (improved from last session). Pt printing 3 sentences with 90% legibility. All writing with extra time required                  OT Short Term Goals - 02/10/15 1540    OT SHORT TERM GOAL #1   Title Pt will be mod I with HEP - 02/11/2115   Status Achieved   OT SHORT TERM GOAL #2   Title Pt will demonstrate improved grip strenth in R hand by at least 5 pounds (baseline=45 pounds) to assist with functional tasks   Status Achieved  60 lbs   OT SHORT TERM GOAL #3   Title Pt will demonstrate improved pinch strength R hand by at least 2 pounds combined (baseline = 31) to assist with functional tasks   Status On-going   OT SHORT TERM GOAL #4   Title Pt will be mod I with cutting meat, AE prn   Status Achieved   OT SHORT TERM GOAL #5   Title Pt will demonstrate mod I with selective attention for basic familiar functional tasks    Status Achieved   OT SHORT TERM GOAL #6   Title Pt will complete at least 10 minutes of functional activity without rest breaks   Status Achieved  per pt report   OT SHORT TERM GOAL #7   Title Pt will demonstrate ability to write at 2 sentence level with 80% legibility with AE prn   Status Achieved           OT Long Term Goals - 01/31/15 1302    OT LONG TERM GOAL #1   Title Pt will be mod I with upgraded HEP- 03/11/2015   Status On-going   OT LONG TERM GOAL #2   Title Pt will demonstrate mod I with alternating attention with basic familiar functional tasks   Status On-going   OT LONG TERM GOAL #3   Title Pt will demonstrate improved grip strength in R hand by at least 8 pounds (baseline= 45) to assist with functional tasks   Status On-going   OT LONG TERM GOAL #4   Title Pt will be mod I with simple home mgmt tasks   Status On-going   OT LONG TERM GOAL #5   Title Pt will be able to complete 20 minutes of activity without rest break   Status On-going   Long Term Additional Goals   Additional Long Term Goals Yes   OT LONG TERM GOAL #6   Title Pt will demonstrate improved coordination as evidenced by decreasing time on 9 hole peg test by at least 10 seconds (baseline = 53.43) to assist with functional tasks   Status On-going   OT LONG TERM GOAL #7   Title Pt will demonstrate ability to write at 3 sentence level with 100% legibility with  AE prn   Status New               Plan - 02/10/15 1541    Clinical Impression Statement Pt met STG's #5 and #7 today. Pt with improved legibility and attention.    Plan grip strength, pinch strength, coordination with key shaped pegs   Consulted and Agree with Plan of Care Patient        Problem List Patient Active Problem List   Diagnosis Date Noted  . 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 CVA (  cerebral infarction) 12/31/2014  . Right hand weakness 12/31/2014  . TIA (transient ischemic attack)  12/31/2014  . Advanced directives, counseling/discussion 04/19/2014  . Spinal stenosis, lumbar region, with neurogenic claudication 12/23/2012  . Osteoarthritis, multiple sites 10/08/2012  . Routine general medical examination at a health care facility 02/25/2012  . NSTEMI (non-ST elevated myocardial infarction) 12/28/2011  . Chronic diastolic HF (heart failure) 12/28/2011    Class: Acute  . Frequent falls 12/28/2011  . Allergic rhinitis 11/11/2008  . Hyperlipemia 04/14/2008  . Anemia 04/14/2008  . Episodic mood disorder 04/14/2008  . Essential hypertension 04/14/2008  . Coronary atherosclerosis 04/14/2008  . Atrial fibrillation 04/14/2008  . COPD (chronic obstructive pulmonary disease) with emphysema 04/14/2008    Carey Bullocks, OTR/L 02/10/2015, 3:42 PM  [facility] [address] [place], [place], [address] Phone: [phone]   Fax:  [phone]

## 2015-02-10 NOTE — Patient Instructions (Signed)
  Please complete the assigned speech therapy homework and return it to your next session.  

## 2015-02-10 NOTE — Therapy (Signed)
[facility] [address] [place], [place], [address] Phone: [phone]   Fax:  [phone]  Speech Language Pathology Treatment  Patient Details  Name: Eduardo [name] MRN: [id number] Date of Birth: 1931-05-19 Referring Provider:  Venia Carbon, MD  Encounter Date: 02/10/2015      End of Session - 02/10/15 1350    Visit Number 3   Number of Visits 16   Date for SLP Re-Evaluation 04/05/15   Authorization Type G - CODE!!!   SLP Start Time 1318   SLP Stop Time  1400   SLP Time Calculation (min) 42 min   Activity Tolerance Patient tolerated treatment well      Past Medical History  Diagnosis Date  . Anemia   . Anxiety   . Atrial fibrillation   . CAD (coronary artery disease)   . Hyperlipidemia   . COPD (chronic obstructive pulmonary disease)   . Allergy   . Osteoarthritis   . Hx of colonic polyps   . Hyperplastic polyp of intestine 2001  . Adenomatous polyp 2001  . Hypertension 12/25/11    pt denies this history  . Myocardial infarction 06/2010    "twice; 1 day apart; during/after cath"  . Exertional dyspnea   . S/P appy   . Arrhythmia     Past Surgical History  Procedure Laterality Date  . Appendectomy    . Carpal tunnel release      left  . Mastoidectomy      x3 on right  . Pilonidal cyst / sinus excision    . Knee arthroscopy      left, right knee 10/2009  . Shoulder arthroscopy      right  . Foot fracture surgery      right heel repair  . Nasal septum surgery      nasal septal repair  . Penile prosthesis implant      x 2--got infection after 2nd and had to remove  . Cataract extraction w/ intraocular lens implant  2/13    Dr [name]  . Carpal tunnel release  10/11/11    Dr Rush Barer  . Tonsillectomy and adenoidectomy    . Fracture surgery    . Cardiac catheterization  2012    50% LAD and luminal irreg in RCA, 1/12  Post cath MI from damage  . Penile prosthesis   removal      There were no vitals filed for this visit.  Visit Diagnosis: Cognitive communication deficit      Subjective Assessment - 02/10/15 1322    Subjective Pt finished homework and had 100% correct. He went through and changed all "cross-outs" from "x"s to lines through, and missed one that was still an "X".               ADULT SLP TREATMENT - 02/10/15 1325    General Information   Behavior/Cognition Cooperative;Pleasant mood;Distractible;Alert   Treatment Provided   Treatment provided Cognitive-Linquistic   Pain Assessment   Pain Assessment 0-10   Pain Score 2    Pain Location back, buttocks   Pain Descriptors / Indicators Sore   Pain Intervention(s) Monitored during session   Cognitive-Linquistic Treatment   Treatment focused on Cognition   Skilled Treatment SLP targeted attention and awareness in cognitive linguistic tasks in picture description in 6-step sequences. Initially, pt without double-checking sequences and having errors, but third sequence he began double checking for errors and self correcting (needed self correction 80% of the time).  Pt's last sequence he did not correct and self corrected errors. He remarked he would need to double check after third sequence, but failed to do so on his last sequence.   Assessment / Recommendations / Plan   Plan Continue with current plan of care   Progression Toward Goals   Progression toward goals Progressing toward goals            SLP Short Term Goals - 02/08/15 1718    SLP SHORT TERM GOAL #1   Title pt will demo alternating attention skills in mod complex cognitive-linguistic tasks with 95% success on both tasks over 3 sessions   Time 4   Period Weeks   Status On-going   SLP SHORT TERM GOAL #2   Title pt will demo emergent awareness demo'd by correcting mod complex cognitive linguistic tasks 100% of the time during task, over two therapy sessions   Time 4   Period Weeks   Status On-going           SLP Long Term Goals - 02/08/15 1718    SLP LONG TERM GOAL #1   Title pt will demo divided attention in functional cognitive-linguistic tasks with 90% success each task over two sessions   Time 8   Period Weeks   Status On-going   SLP LONG TERM GOAL #2   Title pt will demo anticipatory awareness and review cognitive linguistic therapy tasks 100% of the time in 3 therapy sessions   Time 8   Period Weeks   Status On-going          Plan - 02/10/15 1353    Clinical Impression Statement Pt with skilled ST cont necessary to maximize higher level cognitive linguistic skills like alternating and divided attention and emergent awareness. Pt remarked he did better when he double checked his responses - showing anticipatory awareness.   Speech Therapy Frequency 2x / week   Duration --  8 weeks   Treatment/Interventions Patient/family education;Compensatory strategies;Internal/external aids;Cognitive reorganization;SLP instruction and feedback;Functional tasks;Cueing hierarchy   Potential to Achieve Goals Good        Problem List Patient Active Problem List   Diagnosis Date Noted  . 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 CVA (cerebral infarction) 12/31/2014  . Right hand weakness 12/31/2014  . TIA (transient ischemic attack) 12/31/2014  . Advanced directives, counseling/discussion 04/19/2014  . Spinal stenosis, lumbar region, with neurogenic claudication 12/23/2012  . Osteoarthritis, multiple sites 10/08/2012  . Routine general medical examination at a health care facility 02/25/2012  . NSTEMI (non-ST elevated myocardial infarction) 12/28/2011  . Chronic diastolic HF (heart failure) 12/28/2011    Class: Acute  . Frequent falls 12/28/2011  . Allergic rhinitis 11/11/2008  . Hyperlipemia 04/14/2008  . Anemia 04/14/2008  . Episodic mood disorder 04/14/2008  . Essential hypertension 04/14/2008  . Coronary atherosclerosis 04/14/2008  . Atrial fibrillation  04/14/2008  . COPD (chronic obstructive pulmonary disease) with emphysema 04/14/2008    [facility] , MS, CCC-SLP  02/10/2015, 2:04 PM  [facility] [address] [place], [place], [address] Phone: [phone]   Fax:  [phone]

## 2015-02-10 NOTE — Patient Instructions (Signed)
SINGLE LIMB STANCE   Stance: single leg on floor. Raise leg. Try to hold for _10__ seconds. Repeat with other leg. Do this 3 times per day, _5__ days per week   Abduction: Clam (Eccentric) - Side-Lying   Lie on side with knees bent. Lift top knee, keeping feet together. Keep trunk steady. SLOWLY lower for 3-5 seconds. You should feel this in your "back pocket muscle" (like we talked about during PT). Perform 10 reps, 3 times per day on each side. Perform 3-4 days per week.    Bridging   Lie down with both knees bent, both feet positioned flat on bed. Slowly raise buttocks from floor, keeping stomach tight. Hold for 2 seconds, then SLOWLY lower your hips back to resting position. Perform 15 reps, 3 times per day. 3-4 days per week.  Walking Program:  Begin walking for exercise for 3 minutes, 2 times/day, 3 days/week. Don't worry about how quickly you walk; go as slowly as you need to in order to walk 3 consecutive minutes. Progress your walking program by adding 1 minute to your routine each week, as tolerated. Be sure to wear good walking shoes, walk in a safe environment (at the gym, as we discussed), use your cane, and only progress to your tolerance.   Hamstring Stretch (Sitting)   Sitting, extend one leg and place hands on same thigh for support. Keeping torso straight, lean forward, sliding hands down leg, until a stretch is felt in back of thigh. This should be a slow, gentle stretch - it should NOT be painful. Hold _60___ seconds. Repeat with other leg. Perform twice on each leg each day. This should improve the soreness in the back of your legs.      Lie supine, folded towel under sacrum, one ankle crossed onto opposite knee. Holding bottom leg behind knee, gently pull legs toward chest and roll toward top-leg side. Feel stretch in hip or pelvic region. Hold _30__ seconds.  Repeat _2-3__ times per session. Do _2__ sessions per day.  Copyright  VHI. All rights reserved.      Strengthening: Hip Abduction - Resisted   With RED tubing around right leg, other side toward countertop extend RIGHT leg out from side. LEFT hand should be holding onto countertop. Repeat _10___ times per set. Do _3___ sets per session on each side. Do __2_ sessions per day.   * Be sure you're standing up tall at your waist and that you're leading with your heel. You should feel this in the back of your hip, not the front.   Strengthening: Hip Extension - Resisted   Stand facing your countertop; finger tip support on countertop. With RED tubing around right ankle, pull leg straight back. Repeat _15___ times per set. Do _3___ sets per session on each side. Do __2__ sessions per day.

## 2015-02-11 ENCOUNTER — Ambulatory Visit (INDEPENDENT_AMBULATORY_CARE_PROVIDER_SITE_OTHER): Payer: PPO

## 2015-02-11 DIAGNOSIS — R001 Bradycardia, unspecified: Secondary | ICD-10-CM

## 2015-02-11 NOTE — Telephone Encounter (Signed)
Follow up  ° ° °Pt returning call  °

## 2015-02-11 NOTE — Telephone Encounter (Signed)
Please have him wear 24 hour holter ASAP

## 2015-02-11 NOTE — Telephone Encounter (Signed)
Returned pt call.  Pt wife is upset that it has taken so long for them to hear back. Adv her that it has been less than 24 hrs. Pt called in for the first time yesterday morning and I returned the call an hour later. A message was fwd to Dr.Smith and he responded at 9am this morning. Pt wife called back this morning before I had the opportunity to call them.  Pt wife aware of Dr.Smith's recommendation. Please have him wear 24 hour holter ASAP  Pt wife insist pt get monitor today. Adv her that I will need to talk with our monitor tech to see if we have any on hand, and if she has an available appt to put it on. Pt wife sts that any thing less would be unacceptable   Pt wife aware Katy B, monitor tech will work pt in today @ 11:45am for his holter.  Pt wife sts that they are going to [place] next week Thurs for their granddaughters wedding and are worrying about traveling. Adv her that pt monitor results may not be available by Thurs 9/8. Pt is asymptomatic. FYI fwd to Dr.Smith

## 2015-02-15 ENCOUNTER — Ambulatory Visit: Payer: PPO | Admitting: Rehabilitation

## 2015-02-15 ENCOUNTER — Other Ambulatory Visit: Payer: Self-pay

## 2015-02-15 ENCOUNTER — Encounter: Payer: Self-pay | Admitting: Rehabilitation

## 2015-02-15 ENCOUNTER — Encounter: Payer: PPO | Admitting: Speech Pathology

## 2015-02-15 ENCOUNTER — Ambulatory Visit: Payer: PPO | Admitting: Occupational Therapy

## 2015-02-15 DIAGNOSIS — R269 Unspecified abnormalities of gait and mobility: Secondary | ICD-10-CM

## 2015-02-15 DIAGNOSIS — IMO0002 Reserved for concepts with insufficient information to code with codable children: Secondary | ICD-10-CM

## 2015-02-15 DIAGNOSIS — M6281 Muscle weakness (generalized): Secondary | ICD-10-CM

## 2015-02-15 DIAGNOSIS — Z7409 Other reduced mobility: Secondary | ICD-10-CM

## 2015-02-15 DIAGNOSIS — R2681 Unsteadiness on feet: Secondary | ICD-10-CM

## 2015-02-15 DIAGNOSIS — G8191 Hemiplegia, unspecified affecting right dominant side: Secondary | ICD-10-CM

## 2015-02-15 MED ORDER — ATORVASTATIN CALCIUM 20 MG PO TABS: 20.0000 mg | ORAL_TABLET | Freq: Every day | ORAL | Status: DC

## 2015-02-15 NOTE — Therapy (Signed)
[facility] [address] [place], [place], [address] Phone: [phone]   Fax:  [phone]  Occupational Therapy Treatment  Patient Details  Name: Eduardo [name] MRN: [id number] Date of Birth: 05-Apr-1931 Referring Provider:  Venia Carbon, MD  Encounter Date: 02/15/2015      OT End of Session - 02/15/15 1419    Visit Number 8   Number of Visits 16   Date for OT Re-Evaluation 03/11/15   Authorization Type [employer] - assume medicare managed plan and will need G code!!!   Authorization Time Period 01/14/2015-03/11/2015   Authorization - Visit Number 8   Authorization - Number of Visits 10   OT Start Time [id number]   OT Stop Time [id number]   OT Time Calculation (min) 42 min   Activity Tolerance Patient tolerated treatment well      Past Medical History  Diagnosis Date  . Anemia   . Anxiety   . Atrial fibrillation   . CAD (coronary artery disease)   . Hyperlipidemia   . COPD (chronic obstructive pulmonary disease)   . Allergy   . Osteoarthritis   . Hx of colonic polyps   . Hyperplastic polyp of intestine 2001  . Adenomatous polyp 2001  . Hypertension 12/25/11    pt denies this history  . Myocardial infarction 06/2010    "twice; 1 day apart; during/after cath"  . Exertional dyspnea   . S/P appy   . Arrhythmia     Past Surgical History  Procedure Laterality Date  . Appendectomy    . Carpal tunnel release      left  . Mastoidectomy      x3 on right  . Pilonidal cyst / sinus excision    . Knee arthroscopy      left, right knee 10/2009  . Shoulder arthroscopy      right  . Foot fracture surgery      right heel repair  . Nasal septum surgery      nasal septal repair  . Penile prosthesis implant      x 2--got infection after 2nd and had to remove  . Cataract extraction w/ intraocular lens implant  2/13    Dr [name]  . Carpal tunnel release  10/11/11    Dr Rush Barer  .  Tonsillectomy and adenoidectomy    . Fracture surgery    . Cardiac catheterization  2012    50% LAD and luminal irreg in RCA, 1/12  Post cath MI from damage  . Penile prosthesis  removal      There were no vitals filed for this visit.  Visit Diagnosis:  Lack of coordination due to stroke  Hemiplegia affecting right dominant side      Subjective Assessment - 02/15/15 1449    Subjective  Pt reports that he is going to go around the 20th to have shots in his back.  "I can tell my hand is better"   Pertinent History see epic snapshot   Patient Stated Goals get my hand working better   Currently in Pain? No/denies            [facility] OT Assessment - 02/15/15 0001    Hand Function   Right Hand Grip (lbs) 60   Right Hand 3 Point Pinch 11 lbs                  OT Treatments/Exercises (OP) - 02/15/15 1422    ADLs   Writing Pt  practiced writing 5 sentences with approx 90-95% legibility.   Hand Exercises   Other Hand Exercises Picking up blocks using 35lbs sustained grip strength with min-mod difficulty and 1 rest break.  Removing/replacing clothespins with 1-8lb resistance using tip pinch with min difficulty for incr strength.   Other Hand Exercises Removing pegs from red putty to place in pegboard for incr pinch strength and coordination with min-mod difficulty.   Fine Motor Coordination   Fine Motor Coordination Grooved pegs   Grooved pegs with mod difficulty and min v.c. for compensation as pt demo difficulty with in-hand manipulation   Other Fine Motor Exercises Manipulating cylinder pegs in hand to flip with min difficulty/drops.   Other Fine Motor Exercises Manipulating coins in hand to place in containers with min-mod difficulty/drops.                    OT Short Term Goals - 02/15/15 1429    OT SHORT TERM GOAL #1   Title Pt will be mod I with HEP - 02/11/2115   Status Achieved   OT SHORT TERM GOAL #2   Title Pt will demonstrate improved grip strenth in  R hand by at least 5 pounds (baseline=45 pounds) to assist with functional tasks   Status Achieved  60 lbs   OT SHORT TERM GOAL #3   Title Pt will demonstrate improved pinch strength R hand by at least 2 pounds combined (baseline = 31) to assist with functional tasks   Status On-going  02/15/15:  11lbs 3-point   OT SHORT TERM GOAL #4   Title Pt will be mod I with cutting meat, AE prn   Status Achieved   OT SHORT TERM GOAL #5   Title Pt will demonstrate mod I with selective attention for basic familiar functional tasks   Status Achieved   OT SHORT TERM GOAL #6   Title Pt will complete at least 10 minutes of functional activity without rest breaks   Status Achieved  per pt report   OT SHORT TERM GOAL #7   Title Pt will demonstrate ability to write at 2 sentence level with 80% legibility with AE prn   Status Achieved           OT Long Term Goals - 01/31/15 1302    OT LONG TERM GOAL #1   Title Pt will be mod I with upgraded HEP- 03/11/2015   Status On-going   OT LONG TERM GOAL #2   Title Pt will demonstrate mod I with alternating attention with basic familiar functional tasks   Status On-going   OT LONG TERM GOAL #3   Title Pt will demonstrate improved grip strength in R hand by at least 8 pounds (baseline= 45) to assist with functional tasks   Status On-going   OT LONG TERM GOAL #4   Title Pt will be mod I with simple home mgmt tasks   Status On-going   OT LONG TERM GOAL #5   Title Pt will be able to complete 20 minutes of activity without rest break   Status On-going   Long Term Additional Goals   Additional Long Term Goals Yes   OT LONG TERM GOAL #6   Title Pt will demonstrate improved coordination as evidenced by decreasing time on 9 hole peg test by at least 10 seconds (baseline = 53.43) to assist with functional tasks   Status On-going   OT LONG TERM GOAL #7   Title Pt will demonstrate ability to  write at 3 sentence level with 100% legibility with  AE prn   Status New                Plan - 02/15/15 1420    Clinical Impression Statement Pt continues to progress with coordination, strength, and handwriting.   Plan coordination, hand strength   Consulted and Agree with Plan of Care Patient        Problem List Patient Active Problem List   Diagnosis Date Noted  . 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 CVA (cerebral infarction) 12/31/2014  . Right hand weakness 12/31/2014  . TIA (transient ischemic attack) 12/31/2014  . Advanced directives, counseling/discussion 04/19/2014  . Spinal stenosis, lumbar region, with neurogenic claudication 12/23/2012  . Osteoarthritis, multiple sites 10/08/2012  . Routine general medical examination at a health care facility 02/25/2012  . NSTEMI (non-ST elevated myocardial infarction) 12/28/2011  . Chronic diastolic HF (heart failure) 12/28/2011    Class: Acute  . Frequent falls 12/28/2011  . Allergic rhinitis 11/11/2008  . Hyperlipemia 04/14/2008  . Anemia 04/14/2008  . Episodic mood disorder 04/14/2008  . Essential hypertension 04/14/2008  . Coronary atherosclerosis 04/14/2008  . Atrial fibrillation 04/14/2008  . COPD (chronic obstructive pulmonary disease) with emphysema 04/14/2008    [facility] 02/15/2015, 3:04 PM  [facility] [address] [place], [place], [address] Phone: [phone]   Fax:  [place], OTR/L 02/15/2015 3:05 PM

## 2015-02-15 NOTE — Telephone Encounter (Signed)
Refill sent in

## 2015-02-15 NOTE — Therapy (Signed)
[facility] [address] [place], [place], [address] Phone: [phone]   Fax:  [phone]  Physical Therapy Treatment  Patient Details  Name: Eduardo [name] MRN: [id number] Date of Birth: 1930/08/31 Referring Provider:  Venia Carbon, MD  Encounter Date: 02/15/2015      PT End of Session - 02/15/15 1419    Visit Number 9   Number of Visits 17   Date for PT Re-Evaluation 03/15/15   Authorization Type G-Code every 10th visit.   PT Start Time 1315   PT Stop Time 1400   PT Time Calculation ([name]) 45 [name]   Activity Tolerance Patient tolerated treatment well   Behavior During Therapy WFL for tasks assessed/performed      Past Medical History  Diagnosis Date  . Anemia   . Anxiety   . Atrial fibrillation   . CAD (coronary artery disease)   . Hyperlipidemia   . COPD (chronic obstructive pulmonary disease)   . Allergy   . Osteoarthritis   . Hx of colonic polyps   . Hyperplastic polyp of intestine 2001  . Adenomatous polyp 2001  . Hypertension 12/25/11    pt denies this history  . Myocardial infarction 06/2010    "twice; 1 day apart; during/after cath"  . Exertional dyspnea   . S/P appy   . Arrhythmia     Past Surgical History  Procedure Laterality Date  . Appendectomy    . Carpal tunnel release      left  . Mastoidectomy      x3 on right  . Pilonidal cyst / sinus excision    . Knee arthroscopy      left, right knee 10/2009  . Shoulder arthroscopy      right  . Foot fracture surgery      right heel repair  . Nasal septum surgery      nasal septal repair  . Penile prosthesis implant      x 2--got infection after 2nd and had to remove  . Cataract extraction w/ intraocular lens implant  2/13    Dr [name]  . Carpal tunnel release  10/11/11    Dr Rush Barer  . Tonsillectomy and adenoidectomy    . Fracture surgery    . Cardiac catheterization  2012    50% LAD and luminal irreg in  RCA, 1/12  Post cath MI from damage  . Penile prosthesis  removal      There were no vitals filed for this visit.  Visit Diagnosis:  Abnormality of gait  Unsteadiness  Impaired functional mobility and activity tolerance  Muscle weakness      Subjective Assessment - 02/15/15 1318    Subjective Pt states that he visited with spine MD prior to this session and discussed results of MRI.  Note increased stenosis and arthritis per pt report and will be getting cortisone injection towards the end of the month.  Also discussed having to wear heart monitor for 24 hours but that no results had been read at this time.  Reports HR has been "fine" since then.       Pertinent History Chronic atrial fibrillation, combined systolic and diastolic heart failure, CAD, HTN. * Notify cardiologist if HR < 50 bpm   Limitations House hold activities;Walking   Patient Stated Goals "Improve my endurance, get better balance, walk like I used to, and get back to my regimen."   Currently in Pain? Yes   Pain Score 1  Pain Location Back   Pain Orientation Left;Right   Pain Descriptors / Indicators Aching   Pain Type Chronic pain   Pain Onset More than a month ago   Pain Frequency Intermittent   Aggravating Factors  walking and standing   Pain Relieving Factors stretches   Effect of Pain on Daily Activities limits movement                         [facility] Adult PT Treatment/Exercise - 02/15/15 0001    Transfers   Transfers Sit to Stand;Stand to Sit   Sit to Stand 6: Modified independent (Device/Increase time)   Stand to Sit 6: Modified independent (Device/Increase time)   Ambulation/Gait   Ambulation/Gait Yes   Ambulation/Gait Assistance 5: Supervision;4: [name] guard  [name]/guard on outdoor surfaces   Ambulation Distance (Feet) 150 Feet  and various short distances in therapy gym   Assistive device Straight cane   Gait Pattern Step-through pattern;Decreased step length - left;Decreased  stance time - right;Right flexed knee in stance;Trendelenburg;Decreased trunk rotation;Trunk flexed  R Trendelenberg with increased fatigue    Ambulation Surface Level;Unlevel;Indoor;Outdoor;Gravel;Grass;Other (comment)  mulch   Curb 5: Supervision;Other (comment)  [name]/guard   Curb Details (indicate cue type and reason) cues for safety and technique including slower speed of descent/ascent.     Gait Comments Performed gait on outdoor surfaces, over grass, gravel, mulch and up/down curb with use of SPC.  Requires S to [name]/guard for safety with heavy cues for slower gait speed and breath control.  Provided education to ambulate slower with pursed lip breathing during task rather than hurrying and then being out of breath.     High Level Balance   High Level Balance Activities Other (comment)   High Level Balance Comments Performed modified SLS in // bars with LLE on ball rolling forwards/backwards and side/side with focus on increased stability on RLE with BUE support>single UE support.   Also performed marching on airdex foam pad x 20 reps.  Note R ankle inversion, therefore had pt face mirror for increased visual feedback and pt was able to correct.  Utilized BUE support>single UE support.     Exercises   Exercises Other Exercises   Other Exercises  Had pt work on SLS while tapping to cones on red mat to increase balance challenge with compliant surface.  Cues for posture and increased stability on RLE when tapping with LLE.  Tactile cues at R hip to prevent hip from dropping during task.  Progressed to tipping cone over with one LE and bringing back up with other to increase time spent in SLS and further increase RLE WB.   Addressed core strengthening with balance and RLE WB with quadruped alternating UE/LE in extension x 10 reps each side progressing to alternating UE/LE in extension then flex back to extension x 5 reps without touch down in between to increase LE/UE stability, WB, balance and core  strength.                  PT Education - 02/15/15 1408    Education provided Yes   Education Details Pt educated on beginning walking program now that pain is lower.  Provided education on safety with breathing and options of going to gym that has track with wife in order to sit as needed (following the 3 mins).   Person(s) Educated Patient   Methods Explanation   Comprehension Verbalized understanding  PT Short Term Goals - 02/08/15 1422    PT SHORT TERM GOAL #1   Title Pt will perform home exercises with mod I using paper handout to indicate safe HEP compliance and to maximize functional gains. Target date: 02/11/15.   Baseline Still needs cues for technique on all exercises, will go over again next visit.    Status Not Met   PT SHORT TERM GOAL #2   Title Pt/wife will report consistent participation in home walking program (3-5 days/week at minimum) to facilitate improved endurance, activity tolerance. Target date: 02/11/15.   Baseline Pt reports that increased walking causes increased pain. Pt reports he will begin to attempt again.    Status Not Met   PT SHORT TERM GOAL #3   Title Pt will increase 6 Minute Walk distance from 693' to 750' to indicate progress towrd significant improvement in functional endurance. Target date: 02/11/15.   Baseline 948' on 8/30   Status Achieved   PT SHORT TERM GOAL #4   Title Pt will negotiate 12 stairs with 2 rails and mod I to progress toward pt ability to safely access second floor of home without wife providing supervision/assistance. Target date: 02/11/15.   Baseline met 8/30   Status Achieved   PT SHORT TERM GOAL #5   Title Perform Berg Balance Scale to assess/address functional standing balance and fall risk. Target date: 02/11/15.   Baseline Scored 39/56 on 8/9.   Status Achieved           PT Long Term Goals - 02/08/15 1445    PT LONG TERM GOAL #1   Title Pt will increase 6 Minute Walk Test distance from 693' to 806' to  indicate significant improvement in functional endurance. Target date: 03/11/15   Baseline 948' on 8/30    Status Achieved   PT LONG TERM GOAL #2   Title Pt/wife will verbalize understanding of safe fitness regimen for gym progress toward pt-stated goal of being able to safely exercise at gym. Target date: 03/11/15   Status On-going   PT LONG TERM GOAL #3   Title Pt will report no more than 2-point increase in within-session pain rating to indicate ability to perform functional mobility without limitation of pain. Target date: 03/11/15   Status On-going   PT LONG TERM GOAL #4   Title Pt will increase Berg Balance score by 8 points from baseline to indicate significant improvement in functional standing balance. Target date: 03/11/15   Baseline 8/9: baseline Berg score = 39/56   Status On-going   PT LONG TERM GOAL #5   Title Pt/wife will verbalize understanding of CVA warning signs to indicate understanding of situations in which to seek medical attention. Target date: 03/11/15   Status On-going   PT LONG TERM GOAL #6   Title Pt will ambulate > 200' over level surfaces with mod I to indicate increased stability/independence with household mobility. Target date: 03/11/15   Status On-going   PT LONG TERM GOAL #7   Title Pt will ambulate > 300' over unlevel, paved surfaces with mod I using LRAF to indicate increased safety/independence with community mobility, return to PLOF. Target date: 03/11/15   Status On-going   PT LONG TERM GOAL #8   Title Pt will negotiate standard ramp and curb with mod I using LRAD to indicate increased safety/independence negotiating community obstacles, return to PLOF. Target date: 03/11/15   Status On-going   PT LONG TERM GOAL  #9   TITLE Pt will  negotiate 26 stairs with 2 rails requiring single standing rest break (< 1 minute duration) with mod I, increased time to enable pt to safely access second floor of home. Target date: 03/11/15   Status On-going                Plan - 02/15/15 1419    Clinical Impression Statement Skilled session focused on dynamic balance on compliant surfaces with modified and performance of SLS for balance and RLE WB/stabilization/activation.  Also addressed core strengthening, proximal stabilization and balance with quadruped activities and ended session with gait over outdoor surfaces.  Continue to discuss initiation of walking program.   Vitals WFL during session with HR 72-79 during session and SaO2 at 93% at lowest.      Pt will benefit from skilled therapeutic intervention in order to improve on the following deficits Abnormal gait;Decreased endurance;Decreased activity tolerance;Decreased balance;Decreased cognition;Decreased mobility;Decreased strength;Decreased safety awareness;Decreased knowledge of use of DME   Rehab Potential Good   PT Frequency 2x / week   PT Duration 8 weeks   PT Treatment/Interventions ADLs/Self Care Home Management;Functional mobility training;Stair training;Gait training;DME Instruction;Therapeutic activities;Therapeutic exercise;Balance training;Neuromuscular re-education;Patient/family education;Orthotic Fit/Training;Vestibular;Manual techniques   PT Next Visit Plan G-CODE!! ankle strengthening on RLE (esp on compliant surface), gait and balance on compliant surfaces, breathing techniques during mobility   Consulted and Agree with Plan of Care Patient        Problem List Patient Active Problem List   Diagnosis Date Noted  . 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 CVA (cerebral infarction) 12/31/2014  . Right hand weakness 12/31/2014  . TIA (transient ischemic attack) 12/31/2014  . Advanced directives, counseling/discussion 04/19/2014  . Spinal stenosis, lumbar region, with neurogenic claudication 12/23/2012  . Osteoarthritis, multiple sites 10/08/2012  . Routine general medical examination at a health care facility 02/25/2012  . NSTEMI (non-ST elevated myocardial  infarction) 12/28/2011  . Chronic diastolic HF (heart failure) 12/28/2011    Class: Acute  . Frequent falls 12/28/2011  . Allergic rhinitis 11/11/2008  . Hyperlipemia 04/14/2008  . Anemia 04/14/2008  . Episodic mood disorder 04/14/2008  . Essential hypertension 04/14/2008  . Coronary atherosclerosis 04/14/2008  . Atrial fibrillation 04/14/2008  . COPD (chronic obstructive pulmonary disease) with emphysema 04/14/2008    Cameron Sprang, PT, MPT [facility] [address] [place], [place], [address] Phone: [phone]   Fax:  [phone] 02/15/2015, 2:27 PM

## 2015-02-15 NOTE — Telephone Encounter (Signed)
4. Dyslipidemia. Increasing Lipitor from 10 mg to 40 mg. Continue with fenofibrate's.  Follow up with PCP for further options

## 2015-02-15 NOTE — Telephone Encounter (Signed)
Pt should be taking lipitor 20mg  daily. Ok to refill

## 2015-02-16 ENCOUNTER — Telehealth: Payer: Self-pay | Admitting: *Deleted

## 2015-02-16 NOTE — Telephone Encounter (Signed)
[facility] called and said that they had a result for Holter Monitor test.  The result was AFIB, with multiform PVC.  They are faxing the result.  Holter Monitor placed on 02-11-15.  Called [facility] and will deliver lab report when it arrives.

## 2015-02-16 NOTE — Telephone Encounter (Signed)
holter results received. Pt has known afib Dr.Smith will be in the office tomorrow 9/8, will have him review

## 2015-02-17 ENCOUNTER — Ambulatory Visit: Payer: PPO

## 2015-02-17 ENCOUNTER — Ambulatory Visit: Payer: PPO | Admitting: Occupational Therapy

## 2015-02-17 ENCOUNTER — Encounter: Payer: Self-pay | Admitting: Occupational Therapy

## 2015-02-17 VITALS — HR 70

## 2015-02-17 DIAGNOSIS — IMO0002 Reserved for concepts with insufficient information to code with codable children: Secondary | ICD-10-CM

## 2015-02-17 DIAGNOSIS — R41841 Cognitive communication deficit: Secondary | ICD-10-CM

## 2015-02-17 DIAGNOSIS — M6281 Muscle weakness (generalized): Secondary | ICD-10-CM

## 2015-02-17 DIAGNOSIS — R2681 Unsteadiness on feet: Secondary | ICD-10-CM

## 2015-02-17 DIAGNOSIS — R269 Unspecified abnormalities of gait and mobility: Secondary | ICD-10-CM

## 2015-02-17 NOTE — Patient Instructions (Addendum)
Perform in a corner with a chair in front of you OR at kitchen sink with a chair behind you for safety:  "I love a Parade" Lift   Perform at counter, and hold onto counter. March along counter for about 10 feet. Repeat _4___ times. Do __1__ sessions per day.  http://gt2.exer.us/344   Copyright  VHI. All rights reserved.    Feet Together (Compliant Surface) Head Motion - Eyes Open   With eyes open, standing on compliant surface: __pillows______, feet together, move head slowly: up and down 10 times and side to side 10 times. Repeat __3__ times per session. Do __1__ sessions per day.  Copyright  VHI. All rights reserved.

## 2015-02-17 NOTE — Telephone Encounter (Signed)
Prelim holter report reviewed by Dr.Smith. Pt min heart rate 59 bpm, median 75 bpm, max 95 bpm. No action required

## 2015-02-17 NOTE — Therapy (Signed)
[facility] [address] [place], [place], [address] Phone: [phone]   Fax:  [phone]  Speech Language Pathology Treatment  Patient Details  Name: Eduardo [name] MRN: [id number] Date of Birth: Mar 10, 1931 Referring Provider:  Venia Carbon, MD  Encounter Date: 02/17/2015      End of Session - 02/17/15 1346    Visit Number 4   Number of Visits 16   Date for SLP Re-Evaluation 04/05/15   SLP Start Time 0846   SLP Stop Time  0930   SLP Time Calculation (min) 44 min   Activity Tolerance Patient tolerated treatment well      Past Medical History  Diagnosis Date  . Anemia   . Anxiety   . Atrial fibrillation   . CAD (coronary artery disease)   . Hyperlipidemia   . COPD (chronic obstructive pulmonary disease)   . Allergy   . Osteoarthritis   . Hx of colonic polyps   . Hyperplastic polyp of intestine 2001  . Adenomatous polyp 2001  . Hypertension 12/25/11    pt denies this history  . Myocardial infarction 06/2010    "twice; 1 day apart; during/after cath"  . Exertional dyspnea   . S/P appy   . Arrhythmia     Past Surgical History  Procedure Laterality Date  . Appendectomy    . Carpal tunnel release      left  . Mastoidectomy      x3 on right  . Pilonidal cyst / sinus excision    . Knee arthroscopy      left, right knee 10/2009  . Shoulder arthroscopy      right  . Foot fracture surgery      right heel repair  . Nasal septum surgery      nasal septal repair  . Penile prosthesis implant      x 2--got infection after 2nd and had to remove  . Cataract extraction w/ intraocular lens implant  2/13    Dr [name]  . Carpal tunnel release  10/11/11    Dr Rush Barer  . Tonsillectomy and adenoidectomy    . Fracture surgery    . Cardiac catheterization  2012    50% LAD and luminal irreg in RCA, 1/12  Post cath MI from damage  . Penile prosthesis  removal      There were no vitals  filed for this visit.  Visit Diagnosis: Cognitive communication deficit      Subjective Assessment - 02/17/15 0852    Currently in Pain? Yes   Pain Score 1    Pain Location Back   Pain Orientation Lower   Pain Descriptors / Indicators --  tender muscular pain   Pain Type Acute pain   Pain Onset In the past 7 days   Pain Frequency Several days a week   Aggravating Factors  standing   Pain Relieving Factors stretching it out   Effect of Pain on Daily Activities limits movement               ADULT SLP TREATMENT - 02/17/15 0855    General Information   Behavior/Cognition Cooperative;Pleasant mood;Distractible;Alert   Treatment Provided   Treatment provided Cognitive-Linquistic   Cognitive-Linquistic Treatment   Treatment focused on Cognition   Skilled Treatment SLP facilitated pt's attention to detail and error awareness by going through his homework (sequencing - mod complex) with SLP. Pt missed errors in two sets of sequences (15%), following his correction of the  task. In a simple checkbook task, pt made one error he did not change (10%). In deductive reasoning task ([place]) pt required initial cues rarely from SLP.    Assessment / Recommendations / Plan   Plan Continue with current plan of care   Progression Toward Goals   Progression toward goals Progressing toward goals            SLP Short Term Goals - 02/17/15 1348    SLP SHORT TERM GOAL #1   Title pt will demo alternating attention skills in mod complex cognitive-linguistic tasks with 95% success on both tasks over 3 sessions   Time 3   Period Weeks   Status On-going   SLP SHORT TERM GOAL #2   Title pt will demo emergent awareness demo'd by correcting mod complex cognitive linguistic tasks 100% of the time during task, over two therapy sessions   Time 3   Period Weeks   Status On-going          SLP Long Term Goals - 02/17/15 1349    SLP LONG TERM GOAL #1   Title pt will demo divided attention  in functional cognitive-linguistic tasks with 90% success each task over two sessions   Time 7   Period Weeks   Status On-going   SLP LONG TERM GOAL #2   Title pt will demo anticipatory awareness and review cognitive linguistic therapy tasks 100% of the time in 3 therapy sessions   Time 7   Period Weeks   Status On-going          Plan - 02/17/15 1347    Clinical Impression Statement Pt with skilled ST cont necessary to maximize higher level cognitive linguistic skills like alternating and divided attention and emergent awareness. Pt's emergent awareness during checking his work has improved this week.   Speech Therapy Frequency 2x / week   Duration --  7 weeks   Treatment/Interventions Patient/family education;Compensatory strategies;Internal/external aids;Cognitive reorganization;SLP instruction and feedback;Functional tasks;Cueing hierarchy   Potential to Achieve Goals Good        Problem List Patient Active Problem List   Diagnosis Date Noted  . 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 CVA (cerebral infarction) 12/31/2014  . Right hand weakness 12/31/2014  . TIA (transient ischemic attack) 12/31/2014  . Advanced directives, counseling/discussion 04/19/2014  . Spinal stenosis, lumbar region, with neurogenic claudication 12/23/2012  . Osteoarthritis, multiple sites 10/08/2012  . Routine general medical examination at a health care facility 02/25/2012  . NSTEMI (non-ST elevated myocardial infarction) 12/28/2011  . Chronic diastolic HF (heart failure) 12/28/2011    Class: Acute  . Frequent falls 12/28/2011  . Allergic rhinitis 11/11/2008  . Hyperlipemia 04/14/2008  . Anemia 04/14/2008  . Episodic mood disorder 04/14/2008  . Essential hypertension 04/14/2008  . Coronary atherosclerosis 04/14/2008  . Atrial fibrillation 04/14/2008  . COPD (chronic obstructive pulmonary disease) with emphysema 04/14/2008    [facility] , MS, CCC-SLP   02/17/2015,  1:49 PM  [facility] [address] [place], [place], [address] Phone: [phone]   Fax:  [phone]

## 2015-02-17 NOTE — Therapy (Signed)
[facility] [address] [place], [place], [address] Phone: [phone]   Fax:  [phone]  Occupational Therapy Treatment  Patient Details  Name: Eduardo [name] MRN: [id number] Date of Birth: 13-Apr-1931 Referring Provider:  Venia Carbon, MD  Encounter Date: 02/17/2015      OT End of Session - 02/17/15 1030    Visit Number 9   Number of Visits 16   Date for OT Re-Evaluation 03/11/15   Authorization Type [employer] - assume medicare managed plan and will need G code!!!   Authorization Time Period 01/14/2015-03/11/2015   Authorization - Visit Number 9   Authorization - Number of Visits 10   OT Start Time 0940   OT Stop Time 1020   OT Time Calculation (min) 40 min   Activity Tolerance Patient tolerated treatment well   Behavior During Therapy WFL for tasks assessed/performed      Past Medical History  Diagnosis Date  . Anemia   . Anxiety   . Atrial fibrillation   . CAD (coronary artery disease)   . Hyperlipidemia   . COPD (chronic obstructive pulmonary disease)   . Allergy   . Osteoarthritis   . Hx of colonic polyps   . Hyperplastic polyp of intestine 2001  . Adenomatous polyp 2001  . Hypertension 12/25/11    pt denies this history  . Myocardial infarction 06/2010    "twice; 1 day apart; during/after cath"  . Exertional dyspnea   . S/P appy   . Arrhythmia     Past Surgical History  Procedure Laterality Date  . Appendectomy    . Carpal tunnel release      left  . Mastoidectomy      x3 on right  . Pilonidal cyst / sinus excision    . Knee arthroscopy      left, right knee 10/2009  . Shoulder arthroscopy      right  . Foot fracture surgery      right heel repair  . Nasal septum surgery      nasal septal repair  . Penile prosthesis implant      x 2--got infection after 2nd and had to remove  . Cataract extraction w/ intraocular lens implant  2/13    Dr [name]  . Carpal  tunnel release  10/11/11    Dr Rush Barer  . Tonsillectomy and adenoidectomy    . Fracture surgery    . Cardiac catheterization  2012    50% LAD and luminal irreg in RCA, 1/12  Post cath MI from damage  . Penile prosthesis  removal      There were no vitals filed for this visit.  Visit Diagnosis:  Lack of coordination due to stroke  Muscle weakness      Subjective Assessment - 02/17/15 0953    Subjective  I still need to work on my writing.   Patient is accompained by: Family member   Pertinent History see epic snapshot   Patient Stated Goals get my hand working better   Currently in Pain? No/denies   Pain Score 0-No pain                      OT Treatments/Exercises (OP) - 02/17/15 0001    ADLs   Eating Practiced simulated cutting pattern using knife and fork.  Patient with increased tension in neck and shoulders during this task.  With verbal and physical cueing to use gentle sawing motion, patient  able to apply adequatre force to cut "food."  Practiced using spoon to effectively eat cereal.  Patient indicates that he is having difficulty controlling spoon to eat Cheerios.  Patient with tremor in right thumb - and tendency to hold spoon quite distally - less control. Cued patient to move up shaft of spoon and adjust grip to increase control with minimal results.  Attempted ace wrap to provide increased stability to thumb joint, with minimal result.     Home Maintenance Worked on threading and unthreading nuts and bolts one handed, two handed with vision, and two handed with vision occluded.  Patient needed verbal cueing and demonstration to ensure activation of ulnar aspect of hand in this task.   Writing Maintained ace wrap on right thumb to increase stability - patient with old thumb injury, lacks strength / stability, and now with slight tremor - ace wrap provided increased stability, and patient felt writing was better with wrap.  Patient may benefit from soft  thumb support.     Hand Exercises   Digiticizer 5 lbs 10 reps, 5th and 4th digit moving in unison   Fine Motor Coordination   Other Fine Motor Exercises Worked on in hand manipoulation tasks with emphasis on coordination between radial side and ulnar side of right hand.  Patient having difficulty transitioning balls across his palm due to 4th and 5th MCP flexion and extension - fingers tend toward flexion at MCP's, extension much more challenging.  With repetition and verbal cueing, and increased attention to specific motor patterns, patient able to effectively manipulate two steel balls clounter clockwise and clockwise.                                   OT Education - 02/17/15 1029    Education provided Yes   Education Details Writing exercise - start larger as warm up, work to smaller cursive writing, modified grip on spoon, knife, pen   Person(s) Educated Patient   Methods Explanation;Demonstration   Comprehension Verbalized understanding          OT Short Term Goals - 02/15/15 1429    OT SHORT TERM GOAL #1   Title Pt will be mod I with HEP - 02/11/2115   Status Achieved   OT SHORT TERM GOAL #2   Title Pt will demonstrate improved grip strenth in R hand by at least 5 pounds (baseline=45 pounds) to assist with functional tasks   Status Achieved  60 lbs   OT SHORT TERM GOAL #3   Title Pt will demonstrate improved pinch strength R hand by at least 2 pounds combined (baseline = 31) to assist with functional tasks   Status On-going  02/15/15:  11lbs 3-point   OT SHORT TERM GOAL #4   Title Pt will be mod I with cutting meat, AE prn   Status Achieved   OT SHORT TERM GOAL #5   Title Pt will demonstrate mod I with selective attention for basic familiar functional tasks   Status Achieved   OT SHORT TERM GOAL #6   Title Pt will complete at least 10 minutes of functional activity without rest breaks   Status Achieved  per pt report   OT SHORT TERM GOAL #7   Title Pt will  demonstrate ability to write at 2 sentence level with 80% legibility with AE prn   Status Achieved  OT Long Term Goals - 01/31/15 1302    OT LONG TERM GOAL #1   Title Pt will be mod I with upgraded HEP- 03/11/2015   Status On-going   OT LONG TERM GOAL #2   Title Pt will demonstrate mod I with alternating attention with basic familiar functional tasks   Status On-going   OT LONG TERM GOAL #3   Title Pt will demonstrate improved grip strength in R hand by at least 8 pounds (baseline= 45) to assist with functional tasks   Status On-going   OT LONG TERM GOAL #4   Title Pt will be mod I with simple home mgmt tasks   Status On-going   OT LONG TERM GOAL #5   Title Pt will be able to complete 20 minutes of activity without rest break   Status On-going   Long Term Additional Goals   Additional Long Term Goals Yes   OT LONG TERM GOAL #6   Title Pt will demonstrate improved coordination as evidenced by decreasing time on 9 hole peg test by at least 10 seconds (baseline = 53.43) to assist with functional tasks   Status On-going   OT LONG TERM GOAL #7   Title Pt will demonstrate ability to write at 3 sentence level with 100% legibility with  AE prn   Status New               Plan - 02/17/15 1031    Clinical Impression Statement Patient showing improved coordiantion, strength and this is translating to good progress toward overall OT goals.   Pt will benefit from skilled therapeutic intervention in order to improve on the following deficits (Retired) Decreased activity tolerance;Decreased balance;Decreased cognition;Decreased mobility;Decreased endurance;Decreased coordination;Decreased safety awareness;Decreased strength;Impaired UE functional use   Rehab Potential Good   OT Frequency 2x / week   OT Duration 8 weeks   OT Treatment/Interventions Self-care/ADL training;Fluidtherapy;Therapeutic exercise;Neuromuscular education;Manual Therapy;Functional Mobility Training;DME  and/or AE instruction;Cognitive remediation/compensation;Therapeutic activities;Patient/family education;Balance training   Plan ??consider soft thumb support to increase stability, coordination, in hand manipulation, hand strengthening, decrease shoulder / neck over involvement in activity   OT Home Exercise Plan added coordination exercise   Consulted and Agree with Plan of Care Patient        Problem List Patient Active Problem List   Diagnosis Date Noted  . 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 CVA (cerebral infarction) 12/31/2014  . Right hand weakness 12/31/2014  . TIA (transient ischemic attack) 12/31/2014  . Advanced directives, counseling/discussion 04/19/2014  . Spinal stenosis, lumbar region, with neurogenic claudication 12/23/2012  . Osteoarthritis, multiple sites 10/08/2012  . Routine general medical examination at a health care facility 02/25/2012  . NSTEMI (non-ST elevated myocardial infarction) 12/28/2011  . Chronic diastolic HF (heart failure) 12/28/2011    Class: Acute  . Frequent falls 12/28/2011  . Allergic rhinitis 11/11/2008  . Hyperlipemia 04/14/2008  . Anemia 04/14/2008  . Episodic mood disorder 04/14/2008  . Essential hypertension 04/14/2008  . Coronary atherosclerosis 04/14/2008  . Atrial fibrillation 04/14/2008  . COPD (chronic obstructive pulmonary disease) with emphysema 04/14/2008    Mariah Milling, OTR/L 02/17/2015, 10:34 AM  [facility] [address] [place], [place], [address] Phone: [phone]   Fax:  [phone]

## 2015-02-17 NOTE — Patient Instructions (Signed)
  Please complete the assigned speech therapy homework and return it to your next session.  

## 2015-02-17 NOTE — Therapy (Signed)
[facility] [address] [place], [place], [address] Phone: [phone]   Fax:  [phone]  Physical Therapy Treatment  Patient Details  Name: Eduardo [name] MRN: [id number] Date of Birth: 1930-12-23 Referring Provider:  Venia Carbon, MD  Encounter Date: 02/17/2015      PT End of Session - 02/17/15 0901    Visit Number 10   Number of Visits 17   Date for PT Re-Evaluation 03/15/15   Authorization Type G-Code every 10th visit.   PT Start Time 0802   PT Stop Time [phone]   PT Time Calculation (min) 41 min   Equipment Utilized During Treatment Gait belt   Activity Tolerance Patient tolerated treatment well   Behavior During Therapy WFL for tasks assessed/performed      Past Medical History  Diagnosis Date  . Anemia   . Anxiety   . Atrial fibrillation   . CAD (coronary artery disease)   . Hyperlipidemia   . COPD (chronic obstructive pulmonary disease)   . Allergy   . Osteoarthritis   . Hx of colonic polyps   . Hyperplastic polyp of intestine 2001  . Adenomatous polyp 2001  . Hypertension 12/25/11    pt denies this history  . Myocardial infarction 06/2010    "twice; 1 day apart; during/after cath"  . Exertional dyspnea   . S/P appy   . Arrhythmia     Past Surgical History  Procedure Laterality Date  . Appendectomy    . Carpal tunnel release      left  . Mastoidectomy      x3 on right  . Pilonidal cyst / sinus excision    . Knee arthroscopy      left, right knee 10/2009  . Shoulder arthroscopy      right  . Foot fracture surgery      right heel repair  . Nasal septum surgery      nasal septal repair  . Penile prosthesis implant      x 2--got infection after 2nd and had to remove  . Cataract extraction w/ intraocular lens implant  2/13    Dr [name]  . Carpal tunnel release  10/11/11    Dr Rush Barer  . Tonsillectomy and adenoidectomy    . Fracture surgery    . Cardiac  catheterization  2012    50% LAD and luminal irreg in RCA, 1/12  Post cath MI from damage  . Penile prosthesis  removal      Filed Vitals:   02/17/15 0803  Pulse: 70  SpO2: 96%  first reading at rest: End of session SaO2 room air: 98% and HR 86bpm.  Visit Diagnosis:  Abnormality of gait  Unsteadiness  Muscle weakness      Subjective Assessment - 02/17/15 0803    Subjective Pt denied falls or changes since last visit. Pt reported his HR "has been doing good".    Pertinent History Chronic atrial fibrillation, combined systolic and diastolic heart failure, CAD, HTN. * Notify cardiologist if HR < 50 bpm   Patient Stated Goals "Improve my endurance, get better balance, walk like I used to, and get back to my regimen."   Currently in Pain? Yes   Pain Score 2    Pain Location Back   Pain Orientation Right;Left   Pain Descriptors / Indicators Aching   Pain Type Chronic pain   Pain Onset More than a month ago   Pain Frequency Intermittent   Aggravating Factors  walking and standing   Pain Relieving Factors stretches           Therex: 6 MWT: 705' with one minute standing rest break to allow SaO2 on room air to increase >90%, as it decreased to 88% with pt experiencing SOB. SaO2 prior at rest: 96% and HR at rest: 70bpm. SaO2 at 4 minutes: 88% with SOB and HR 103bpm, one minute after 6MWT SaO2: 97% and HR 68bpm. Pt required seated rest break after 6MWT to allow SOB to subside.               Balance Exercises - 02/17/15 0854    Balance Exercises: Standing   Standing Eyes Opened Narrow base of support (BOS);Wide (BOA);Head turns;Foam/compliant surface;Other reps (comment);30 secs  10 reps and 30 second holds on pillow   Other Standing Exercises On blue mat: B heel taps to dots x10/LE, B Cone taps (single 2x5/LE), forward amb. with and without bean bags placed under blue mat. At counter, non-compliant surface: forward marches with 1 UE support and min guard-supervision  for safety. Pt required cues for technique and min guard to ensure safety, but did require min A during SLS cone taps to maintain balance.           PT Education - 02/17/15 0901    Education provided Yes   Education Details Balance HEP. Pursed lip breathing to improve SaO2 and decrease SOB.   Person(s) Educated Patient   Methods Explanation;Demonstration;Verbal cues;Handout   Comprehension Returned demonstration;Verbalized understanding          PT Short Term Goals - 02/08/15 1422    PT SHORT TERM GOAL #1   Title Pt will perform home exercises with mod I using paper handout to indicate safe HEP compliance and to maximize functional gains. Target date: 02/11/15.   Baseline Still needs cues for technique on all exercises, will go over again next visit.    Status Not Met   PT SHORT TERM GOAL #2   Title Pt/wife will report consistent participation in home walking program (3-5 days/week at minimum) to facilitate improved endurance, activity tolerance. Target date: 02/11/15.   Baseline Pt reports that increased walking causes increased pain. Pt reports he will begin to attempt again.    Status Not Met   PT SHORT TERM GOAL #3   Title Pt will increase 6 Minute Walk distance from 693' to 750' to indicate progress towrd significant improvement in functional endurance. Target date: 02/11/15.   Baseline 948' on 8/30   Status Achieved   PT SHORT TERM GOAL #4   Title Pt will negotiate 12 stairs with 2 rails and mod I to progress toward pt ability to safely access second floor of home without wife providing supervision/assistance. Target date: 02/11/15.   Baseline met 8/30   Status Achieved   PT SHORT TERM GOAL #5   Title Perform Berg Balance Scale to assess/address functional standing balance and fall risk. Target date: 02/11/15.   Baseline Scored 39/56 on 8/9.   Status Achieved           PT Long Term Goals - 02/08/15 1445    PT LONG TERM GOAL #1   Title Pt will increase 6 Minute Walk Test  distance from 693' to 806' to indicate significant improvement in functional endurance. Target date: 03/11/15   Baseline 948' on 8/30    Status Achieved   PT LONG TERM GOAL #2   Title Pt/wife will verbalize understanding of safe fitness regimen for gym  progress toward pt-stated goal of being able to safely exercise at gym. Target date: 03/11/15   Status On-going   PT LONG TERM GOAL #3   Title Pt will report no more than 2-point increase in within-session pain rating to indicate ability to perform functional mobility without limitation of pain. Target date: 03/11/15   Status On-going   PT LONG TERM GOAL #4   Title Pt will increase Berg Balance score by 8 points from baseline to indicate significant improvement in functional standing balance. Target date: 03/11/15   Baseline 8/9: baseline Berg score = 39/56   Status On-going   PT LONG TERM GOAL #5   Title Pt/wife will verbalize understanding of CVA warning signs to indicate understanding of situations in which to seek medical attention. Target date: 03/11/15   Status On-going   PT LONG TERM GOAL #6   Title Pt will ambulate > 200' over level surfaces with mod I to indicate increased stability/independence with household mobility. Target date: 03/11/15   Status On-going   PT LONG TERM GOAL #7   Title Pt will ambulate > 300' over unlevel, paved surfaces with mod I using LRAF to indicate increased safety/independence with community mobility, return to PLOF. Target date: 03/11/15   Status On-going   PT LONG TERM GOAL #8   Title Pt will negotiate standard ramp and curb with mod I using LRAD to indicate increased safety/independence negotiating community obstacles, return to PLOF. Target date: 03/11/15   Status On-going   PT LONG TERM GOAL  #9   TITLE Pt will negotiate 26 stairs with 2 rails requiring single standing rest break (< 1 minute duration) with mod I, increased time to enable pt to safely access second floor of home. Target date: 03/11/15   Status  On-going               Plan - March 06, 2015 0901    Clinical Impression Statement Pt improved 6MWT distance by 12' which is not considered a significant change, but was limited by decreased SaO2 on room air (88%) and had to take standing rest break to allow SaO2 >90%. Pt's HR was WNL during session, but SaO2 on room air did drop to 88% during 6MWT. Pt continues to experience increased postural sway and LOB during SLS activities, especially on compliant surfaces. Pt would continue to benefit from skilled PT to improve safety during functional mobility.   Pt will benefit from skilled therapeutic intervention in order to improve on the following deficits Abnormal gait;Decreased endurance;Decreased activity tolerance;Decreased balance;Decreased cognition;Decreased mobility;Decreased strength;Decreased safety awareness;Decreased knowledge of use of DME   Rehab Potential Good   PT Frequency 2x / week   PT Duration 8 weeks   PT Treatment/Interventions ADLs/Self Care Home Management;Functional mobility training;Stair training;Gait training;DME Instruction;Therapeutic activities;Therapeutic exercise;Balance training;Neuromuscular re-education;Patient/family education;Orthotic Fit/Training;Vestibular;Manual techniques   PT Next Visit Plan Initate ant/post weight shifting, as pt has tendency to lean in post. direction. Continue ankle strengthening/balance training on RLE (esp on compliant surface), on compliant surfaces, breathing techniques during mobility   PT Home Exercise Plan Strengthening/flexibility/balance HEP   Consulted and Agree with Plan of Care Patient          G-Codes - 03/06/2015 8850    Functional Assessment Tool Used 6 minute walk test (MWT): 705' with 1 minute rest break to allow SaO2 on room air to increase to >90%.   Functional Limitation Mobility: Walking and moving around   Mobility: Walking and Moving Around Current Status [phone]) At least 40 percent  but less than 60 percent  impaired, limited or restricted   Mobility: Walking and Moving Around Goal Status [phone]) At least 20 percent but less than 40 percent impaired, limited or restricted      Problem List Patient Active Problem List   Diagnosis Date Noted  . 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 CVA (cerebral infarction) 12/31/2014  . Right hand weakness 12/31/2014  . TIA (transient ischemic attack) 12/31/2014  . Advanced directives, counseling/discussion 04/19/2014  . Spinal stenosis, lumbar region, with neurogenic claudication 12/23/2012  . Osteoarthritis, multiple sites 10/08/2012  . Routine general medical examination at a health care facility 02/25/2012  . NSTEMI (non-ST elevated myocardial infarction) 12/28/2011  . Chronic diastolic HF (heart failure) 12/28/2011    Class: Acute  . Frequent falls 12/28/2011  . Allergic rhinitis 11/11/2008  . Hyperlipemia 04/14/2008  . Anemia 04/14/2008  . Episodic mood disorder 04/14/2008  . Essential hypertension 04/14/2008  . Coronary atherosclerosis 04/14/2008  . Atrial fibrillation 04/14/2008  . COPD (chronic obstructive pulmonary disease) with emphysema 04/14/2008    Dezire Turk L 02/17/2015, 9:06 AM  [facility] [address] [place], [place], [address] Phone: [phone]   Fax:  [phone]  Physical Therapy Progress Note  Dates of Reporting Period: 01/14/15 to 02/17/15  Objective Reports of Subjective Statement: Pt has progressed to balance/amb. Tasks on compliant surfaces vs. Non-compliant surfaces. Pt is also demonstrated improvement in slowing down his gait speed in order to improve safey.  Objective Measurements:      PT Short Term Goals - 02/08/15 1422    PT SHORT TERM GOAL #1   Title Pt will perform home exercises with mod I using paper handout to indicate safe HEP compliance and to maximize functional gains. Target date: 02/11/15.   Baseline Still needs  cues for technique on all exercises, will go over again next visit.    Status Not Met   PT SHORT TERM GOAL #2   Title Pt/wife will report consistent participation in home walking program (3-5 days/week at minimum) to facilitate improved endurance, activity tolerance. Target date: 02/11/15.   Baseline Pt reports that increased walking causes increased pain. Pt reports he will begin to attempt again.    Status Not Met   PT SHORT TERM GOAL #3   Title Pt will increase 6 Minute Walk distance from 693' to 750' to indicate progress towrd significant improvement in functional endurance. Target date: 02/11/15.   Baseline 948' on 8/30   Status Achieved   PT SHORT TERM GOAL #4   Title Pt will negotiate 12 stairs with 2 rails and mod I to progress toward pt ability to safely access second floor of home without wife providing supervision/assistance. Target date: 02/11/15.   Baseline met 8/30   Status Achieved   PT SHORT TERM GOAL #5   Title Perform Berg Balance Scale to assess/address functional standing balance and fall risk. Target date: 02/11/15.   Baseline Scored 39/56 on 8/9.   Status Achieved       Goal Update: Continue towards LTGs.  Plan: Continue with current POC.  Reason Skilled Services are Required: To improve strength, balance, endurance, and safety during functional mobility.     Geoffry Paradise, PT,DPT 02/17/2015 9:06 AM Phone: [phone] Fax: [phone]

## 2015-02-22 ENCOUNTER — Ambulatory Visit: Payer: PPO | Admitting: Occupational Therapy

## 2015-02-22 ENCOUNTER — Encounter: Payer: Self-pay | Admitting: Occupational Therapy

## 2015-02-22 ENCOUNTER — Ambulatory Visit: Payer: PPO | Admitting: Speech Pathology

## 2015-02-22 ENCOUNTER — Encounter: Payer: Self-pay | Admitting: Rehabilitation

## 2015-02-22 ENCOUNTER — Ambulatory Visit: Payer: PPO | Admitting: Rehabilitation

## 2015-02-22 DIAGNOSIS — R41841 Cognitive communication deficit: Secondary | ICD-10-CM

## 2015-02-22 DIAGNOSIS — M6281 Muscle weakness (generalized): Secondary | ICD-10-CM | POA: Diagnosis not present

## 2015-02-22 DIAGNOSIS — R531 Weakness: Secondary | ICD-10-CM

## 2015-02-22 DIAGNOSIS — IMO0002 Reserved for concepts with insufficient information to code with codable children: Secondary | ICD-10-CM

## 2015-02-22 DIAGNOSIS — Z7409 Other reduced mobility: Secondary | ICD-10-CM

## 2015-02-22 DIAGNOSIS — R269 Unspecified abnormalities of gait and mobility: Secondary | ICD-10-CM

## 2015-02-22 DIAGNOSIS — G8191 Hemiplegia, unspecified affecting right dominant side: Secondary | ICD-10-CM

## 2015-02-22 DIAGNOSIS — R2681 Unsteadiness on feet: Secondary | ICD-10-CM

## 2015-02-22 NOTE — Therapy (Signed)
[facility] [address] [place], [place], [address] Phone: [phone]   Fax:  [phone]  Occupational Therapy Treatment  Patient Details  Name: Eduardo [name] MRN: [id number] Date of Birth: 07/15/1930 Referring Provider:  Venia Carbon, MD  Encounter Date: 02/22/2015      OT End of Session - 02/22/15 1307    Visit Number 10   Number of Visits 16   Date for OT Re-Evaluation 03/11/15   Authorization Type [employer] - assume medicare managed plan and will need G code!!!   Authorization Time Period 01/14/2015-03/11/2015   Authorization - Visit Number 10   Authorization - Number of Visits 10   OT Start Time 1102   OT Stop Time 1145   OT Time Calculation (min) 43 min   Activity Tolerance Patient tolerated treatment well      Past Medical History  Diagnosis Date  . Anemia   . Anxiety   . Atrial fibrillation   . CAD (coronary artery disease)   . Hyperlipidemia   . COPD (chronic obstructive pulmonary disease)   . Allergy   . Osteoarthritis   . Hx of colonic polyps   . Hyperplastic polyp of intestine 2001  . Adenomatous polyp 2001  . Hypertension 12/25/11    pt denies this history  . Myocardial infarction 06/2010    "twice; 1 day apart; during/after cath"  . Exertional dyspnea   . S/P appy   . Arrhythmia     Past Surgical History  Procedure Laterality Date  . Appendectomy    . Carpal tunnel release      left  . Mastoidectomy      x3 on right  . Pilonidal cyst / sinus excision    . Knee arthroscopy      left, right knee 10/2009  . Shoulder arthroscopy      right  . Foot fracture surgery      right heel repair  . Nasal septum surgery      nasal septal repair  . Penile prosthesis implant      x 2--got infection after 2nd and had to remove  . Cataract extraction w/ intraocular lens implant  2/13    Dr [name]  . Carpal tunnel release  10/11/11    Dr Rush Barer  .  Tonsillectomy and adenoidectomy    . Fracture surgery    . Cardiac catheterization  2012    50% LAD and luminal irreg in RCA, 1/12  Post cath MI from damage  . Penile prosthesis  removal      There were no vitals filed for this visit.  Visit Diagnosis:  Lack of coordination due to stroke  Muscle weakness  Hemiplegia affecting right dominant side  Impaired functional mobility and activity tolerance      Subjective Assessment - 02/22/15 1107    Subjective  My hand is getting much better   Pertinent History see epic snapshot   Patient Stated Goals get my hand working better   Currently in Pain? No/denies                      OT Treatments/Exercises (OP) - 02/22/15 0001    ADLs   Writing Practiced writing with emphasis on starting big and then bringing letters down to normal size - pt needs mod vc's to slow down and keep letters big.  educated pt on soft thumb splint for support and how to obtain one - pt able  to verbalize understanding.    ADL Comments Checked goals.  Please see goal section for updates   Fine Motor Coordination   Other Fine Motor Exercises Addressed in hand manipulation with emphasis on adjusting speed based on activity, focus on hand orienation to task, 2 point vs 3 point pinch instead of always substituting lateral pinch when task is difficult and when possible increasing speed while maintaining accuracy.                    OT Short Term Goals - 02/22/15 1302    OT SHORT TERM GOAL #1   Title Pt will be mod I with HEP - 02/11/2115   Status Achieved   OT SHORT TERM GOAL #2   Title Pt will demonstrate improved grip strenth in R hand by at least 5 pounds (baseline=45 pounds) to assist with functional tasks   Status Achieved  60 lbs   OT SHORT TERM GOAL #3   Title Pt will demonstrate improved pinch strength R hand by at least 2 pounds combined (baseline = 31) to assist with functional tasks   Status Achieved  02/21/2015 combined = 38  pounds   OT SHORT TERM GOAL #4   Title Pt will be mod I with cutting meat, AE prn   Status Achieved   OT SHORT TERM GOAL #5   Title Pt will demonstrate mod I with selective attention for basic familiar functional tasks   Status Achieved   OT SHORT TERM GOAL #6   Title Pt will complete at least 10 minutes of functional activity without rest breaks   Status Achieved  per pt report   OT SHORT TERM GOAL #7   Title Pt will demonstrate ability to write at 2 sentence level with 80% legibility with AE prn   Status Achieved           OT Long Term Goals - 02/22/15 1303    OT LONG TERM GOAL #1   Title Pt will be mod I with upgraded HEP- 03/11/2015   Status On-going   OT LONG TERM GOAL #2   Title Pt will demonstrate mod I with alternating attention with basic familiar functional tasks   Status On-going   OT LONG TERM GOAL #3   Title Pt will demonstrate improved grip strength in R hand by at least 8 pounds (baseline= 45) to assist with functional tasks   Status Achieved  60 pounds.Pt feels strength is at basline   OT LONG TERM GOAL #4   Title Pt will be mod I with simple home mgmt tasks   Status Achieved  pt is cooking breakfast, doing dishes, sweeping floor, etc   OT LONG TERM GOAL #5   Title Pt will be able to complete 20 minutes of activity without rest break   Status Achieved  pt able to complete shower, dressing and hot meal prep before sitting down   OT LONG TERM GOAL #6   Title Pt will demonstrate improved coordination as evidenced by decreasing time on 9 hole peg test by at least 10 seconds (baseline = 53.43) to assist with functional tasks   Status On-going  44 on 02/21/2015   OT LONG TERM GOAL #7   Title Pt will demonstrate ability to write at 3 sentence level with 100% legibility with  AE prn   Status On-going               Plan - 02/22/15 1305    Clinical Impression Statement  Pt with good improvement in strength, functional use of hand and coordination. Pt feels  strength is now at baseline and is doing many activities at home.  Pt continues to exhibit coordination difficulties that also impact writing   Pt will benefit from skilled therapeutic intervention in order to improve on the following deficits (Retired) Decreased activity tolerance;Decreased balance;Decreased cognition;Decreased mobility;Decreased endurance;Decreased coordination;Decreased safety awareness;Decreased strength;Impaired UE functional use   Rehab Potential Good   OT Frequency 2x / week   OT Duration 8 weeks   OT Treatment/Interventions Self-care/ADL training;Fluidtherapy;Therapeutic exercise;Neuromuscular education;Manual Therapy;Functional Mobility Training;DME and/or AE instruction;Cognitive remediation/compensation;Therapeutic activities;Patient/family education;Balance training   Plan in hand manipulation, coordination, writing, speed of using hand, hand orientation to task   OT Home Exercise Plan added coordination exercise   Consulted and Agree with Plan of Care Patient          G-Codes - 2015/03/08 1309    Functional Assessment Tool Used 9 hole peg, grip strength, clinical observation   Functional Limitation Carrying, moving and handling objects   Carrying, Moving and Handling Objects Current Status (B3403) At least 40 percent but less than 60 percent impaired, limited or restricted   Carrying, Moving and Handling Objects Goal Status (J0964) At least 20 percent but less than 40 percent impaired, limited or restricted      Problem List Patient Active Problem List   Diagnosis Date Noted  . 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 CVA (cerebral infarction) 12/31/2014  . Right hand weakness 12/31/2014  . TIA (transient ischemic attack) 12/31/2014  . Advanced directives, counseling/discussion 04/19/2014  . Spinal stenosis, lumbar region, with neurogenic claudication 12/23/2012  . Osteoarthritis, multiple sites 10/08/2012  . Routine general medical  examination at a health care facility 02/25/2012  . NSTEMI (non-ST elevated myocardial infarction) 12/28/2011  . Chronic diastolic HF (heart failure) 12/28/2011    Class: Acute  . Frequent falls 12/28/2011  . Allergic rhinitis 11/11/2008  . Hyperlipemia 04/14/2008  . Anemia 04/14/2008  . Episodic mood disorder 04/14/2008  . Essential hypertension 04/14/2008  . Coronary atherosclerosis 04/14/2008  . Atrial fibrillation 04/14/2008  . COPD (chronic obstructive pulmonary disease) with emphysema 04/14/2008   Occupational Therapy Progress Note  Dates of Reporting Period: 01/14/2015  to 02/21/2015  Objective Reports of Subjective Statement: Pt states he feels strength in hand is at baseline and he is doing many activities at home now  Objective Measurements: 9 hole peg dropped 9 seconds, grip strength improved by 15 pounds to 60 pounds.   Goal Update: See goals above (pt has met 6/6 STG's and 3/7 LTG's)  Plan: See POC and goals.  Reason Skilled Services are Required: continue to address coordination, writing and in hand manipulation    Quay Burow, OTR/L Mar 08, 2015, 1:11 PM  [facility] [address] [place], [place], [address] Phone: [phone]   Fax:  [phone]

## 2015-02-22 NOTE — Telephone Encounter (Signed)
Called to give pt holter monitor results.[email or web address]

## 2015-02-22 NOTE — Telephone Encounter (Signed)
-----   Message from Belva Crome, MD sent at 02/19/2015  4:18 PM EDT ----- Let them know rate is in a very safe range on current therapy. No further changes needed.

## 2015-02-22 NOTE — Therapy (Signed)
[facility] [address] [place], [place], [address] Phone: [phone]   Fax:  [phone]  Speech Language Pathology Treatment  Patient Details  Name: Eduardo [name] MRN: [id number] Date of Birth: January 14, 1931 Referring Provider:  Venia Carbon, MD  Encounter Date: 02/22/2015      End of Session - 02/22/15 1057    SLP Start Time 1017   SLP Stop Time  [id number]   SLP Time Calculation (min) 41 min      Past Medical History  Diagnosis Date  . Anemia   . Anxiety   . Atrial fibrillation   . CAD (coronary artery disease)   . Hyperlipidemia   . COPD (chronic obstructive pulmonary disease)   . Allergy   . Osteoarthritis   . Hx of colonic polyps   . Hyperplastic polyp of intestine 2001  . Adenomatous polyp 2001  . Hypertension 12/25/11    pt denies this history  . Myocardial infarction 06/2010    "twice; 1 day apart; during/after cath"  . Exertional dyspnea   . S/P appy   . Arrhythmia     Past Surgical History  Procedure Laterality Date  . Appendectomy    . Carpal tunnel release      left  . Mastoidectomy      x3 on right  . Pilonidal cyst / sinus excision    . Knee arthroscopy      left, right knee 10/2009  . Shoulder arthroscopy      right  . Foot fracture surgery      right heel repair  . Nasal septum surgery      nasal septal repair  . Penile prosthesis implant      x 2--got infection after 2nd and had to remove  . Cataract extraction w/ intraocular lens implant  2/13    Dr [name]  . Carpal tunnel release  10/11/11    Dr Rush Barer  . Tonsillectomy and adenoidectomy    . Fracture surgery    . Cardiac catheterization  2012    50% LAD and luminal irreg in RCA, 1/12  Post cath MI from damage  . Penile prosthesis  removal      There were no vitals filed for this visit.  Visit Diagnosis: Cognitive communication deficit      Subjective Assessment - 02/22/15 1023    Subjective  "The homework was easy except the moving van sheet"   Currently in Pain? No/denies               ADULT SLP TREATMENT - 02/22/15 1032    General Information   Behavior/Cognition Cooperative;Pleasant mood;Distractible;Alert   Treatment Provided   Treatment provided Cognitive-Linquistic   Cognitive-Linquistic Treatment   Treatment focused on Cognition   Skilled Treatment Attention to detail and alternating attention sorting cards into 3 different rules for each pile with extended time and occasional min cues to attend to various piles and attend to the cards in his hand, 85% accuracy for each pile.  Initiate alternating attention/reasoning tasks [facility] court), with rare min A - to be completed for homework.   Assessment / Recommendations / Plan   Plan Continue with current plan of care   Progression Toward Goals   Progression toward goals Progressing toward goals          SLP Education - 02/22/15 1053    Education provided Yes   Education Details Alternating attention compensations.   Person(s) Educated Patient   Methods Explanation;Demonstration  Comprehension Verbalized understanding          SLP Short Term Goals - 02/22/15 1055    SLP SHORT TERM GOAL #1   Title pt will demo alternating attention skills in mod complex cognitive-linguistic tasks with 95% success on both tasks over 3 sessions   Time 2   Period Weeks   Status On-going   SLP SHORT TERM GOAL #2   Title pt will demo emergent awareness demo'd by correcting mod complex cognitive linguistic tasks 100% of the time during task, over two therapy sessions   Time 2   Period Weeks   Status On-going          SLP Long Term Goals - 02/22/15 1055    SLP LONG TERM GOAL #1   Title pt will demo divided attention in functional cognitive-linguistic tasks with 90% success each task over two sessions   Time 6   Period Weeks   Status On-going   SLP LONG TERM GOAL #2   Title pt will demo anticipatory awareness and  review cognitive linguistic therapy tasks 100% of the time in 3 therapy sessions   Time 6   Period Weeks   Status On-going          Plan - 02/22/15 1055    Clinical Impression Statement Pt with skilled ST cont necessary to maximize higher level cognitive linguistic skills like alternating and divided attention and emergent awareness. Pt's emergent awareness during checking his work has improved this week.   Speech Therapy Frequency 2x / week   Treatment/Interventions Patient/family education;Compensatory strategies;Internal/external aids;Cognitive reorganization;SLP instruction and feedback;Functional tasks;Cueing hierarchy   Potential to Achieve Goals Good        Problem List Patient Active Problem List   Diagnosis Date Noted  . 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 CVA (cerebral infarction) 12/31/2014  . Right hand weakness 12/31/2014  . TIA (transient ischemic attack) 12/31/2014  . Advanced directives, counseling/discussion 04/19/2014  . Spinal stenosis, lumbar region, with neurogenic claudication 12/23/2012  . Osteoarthritis, multiple sites 10/08/2012  . Routine general medical examination at a health care facility 02/25/2012  . NSTEMI (non-ST elevated myocardial infarction) 12/28/2011  . Chronic diastolic HF (heart failure) 12/28/2011    Class: Acute  . Frequent falls 12/28/2011  . Allergic rhinitis 11/11/2008  . Hyperlipemia 04/14/2008  . Anemia 04/14/2008  . Episodic mood disorder 04/14/2008  . Essential hypertension 04/14/2008  . Coronary atherosclerosis 04/14/2008  . Atrial fibrillation 04/14/2008  . COPD (chronic obstructive pulmonary disease) with emphysema 04/14/2008    Honore Wipperfurth, Annye Rusk MS, CCC-SLP 02/22/2015, 10:58 AM  [facility] [address] [place], [place], [address] Phone: [phone]   Fax:  [phone]

## 2015-02-22 NOTE — Patient Instructions (Signed)
Feet Apart (Compliant Surface) Varied Arm Positions - Eyes Open   With eyes open, standing on compliant surface: _Arms by your side_, feet shoulder width apart and arms out, look at a stationary object. Hold __15-20__ seconds. Repeat _2___ times per session. Do __2__ sessions per day.  Have chair in front of you for support if needed and do in corner of room in case of loss of balance.  Progress this by closing eyes (keep light touch on chair in front for safety).  Hold with eyes closed for 15 secs, repeat 2 times and do 2 times per day.    Do this progression for 2-3 days before increasing challenge.    Weight Shift: Anterior / Posterior (Righting / Equilibrium)    BEGIN WITH BACK LEANING AGAINST THE WALL AND FEET 4 INCHES AWAY. Move your hips off the wall and come to upright standing.  Hold for 5 seconds. Return slowly to the wall letting your hips bump the wall and return to stand.   Hold each position _10___ seconds. Repeat __10_ times per session. Do _2__ sessions per day.    Copyright  VHI. All rights reserved.

## 2015-02-22 NOTE — Therapy (Signed)
[facility] [address] [name], [place], [address] Phone: [phone]   Fax:  [phone]  Physical Therapy Treatment  Patient Details  Name: Eduardo [name] MRN: [id number] Date of Birth: 1930/06/24 Referring Provider:  Venia Carbon, MD  Encounter Date: 02/22/2015      PT End of Session - 02/22/15 1245    Visit Number 11   Number of Visits 17   Date for PT Re-Evaluation 03/15/15   Authorization Type G-Code every 10th visit.   PT Start Time 1149   PT Stop Time 1230   PT Time Calculation (min) 41 min   Activity Tolerance Patient tolerated treatment well   Behavior During Therapy WFL for tasks assessed/performed      Past Medical History  Diagnosis Date  . Anemia   . Anxiety   . Atrial fibrillation   . CAD (coronary artery disease)   . Hyperlipidemia   . COPD (chronic obstructive pulmonary disease)   . Allergy   . Osteoarthritis   . Hx of colonic polyps   . Hyperplastic polyp of intestine 2001  . Adenomatous polyp 2001  . Hypertension 12/25/11    pt denies this history  . Myocardial infarction 06/2010    "twice; 1 day apart; during/after cath"  . Exertional dyspnea   . S/P appy   . Arrhythmia     Past Surgical History  Procedure Laterality Date  . Appendectomy    . Carpal tunnel release      left  . Mastoidectomy      x3 on right  . Pilonidal cyst / sinus excision    . Knee arthroscopy      left, right knee 10/2009  . Shoulder arthroscopy      right  . Foot fracture surgery      right heel repair  . Nasal septum surgery      nasal septal repair  . Penile prosthesis implant      x 2--got infection after 2nd and had to remove  . Cataract extraction w/ intraocular lens implant  2/13    Dr [name]  . Carpal tunnel release  10/11/11    Dr Rush Barer  . Tonsillectomy and adenoidectomy    . Fracture surgery    . Cardiac catheterization  2012    50% LAD and luminal irreg in  RCA, 1/12  Post cath MI from damage  . Penile prosthesis  removal      There were no vitals filed for this visit.  Visit Diagnosis:  Abnormality of gait  Unsteadiness  Weakness generalized      Subjective Assessment - 02/22/15 1231    Subjective Pt states that granddaughter's wedding went well, was on his feet a lot with minimal pain in back.  No falls since last visit.    Limitations House hold activities;Walking   Patient Stated Goals "Improve my endurance, get better balance, walk like I used to, and get back to my regimen."   Currently in Pain? No/denies                         [facility] Adult PT Treatment/Exercise - 02/22/15 1232    Transfers   Transfers Sit to Stand;Stand to Sit   Sit to Stand 6: Modified independent (Device/Increase time)   Stand to Sit 6: Modified independent (Device/Increase time)   Ambulation/Gait   Ambulation/Gait Yes   Ambulation/Gait Assistance 5: Supervision  without device   Ambulation Distance (  Feet) 50 Feet   Gait Pattern Step-through pattern;Decreased step length - left;Decreased stance time - right;Right flexed knee in stance;Trendelenburg;Decreased trunk rotation;Trunk flexed  R Trendelenberg with increased fatigue    Gait Comments Performed short distance gait in clinic without AD at S level for safety.  Continue to note Trendelenburg gait pattern, esp following balance exercises.  Performed stretching during session to assess.     Dynamic Standing Balance   Foam balance beam comments: Performed wall bumps on foam balance beam to address ant/posterior weight shifts, graded movement in weight shifts as well as ankle/hip strategy.  Cues for safe technique and decreasing amount of B knee flex to compensate for poor anterior weight shift.    High Level Balance   High Level Balance Activities Other (comment)   High Level Balance Comments Performed corner balance tasks as follows; standing on foam pad with chair in front for support  with feet apart EO>feet apart EC (with light support on chair)>feet closer together EO>feet closer [facility] (provided some of these for HEP, see pt instruction for details).  Provided cues for safety with use of chair as well as visual targeting prior to closing eyes.  Performed the following activities on compliant surface to challenge balance, increase SLS and WB on RLE; marching forwards/backwards, tandem gait forwards/backwards, combination marching w/ tandem placement forwards and backwards x 3 reps each (of approx 8').  Cues for light touch on counter for safety.    Neuro Re-ed    Neuro Re-ed Details  Performed ant/posterior weight shifts while on small rocker board to address adequate weight shifts and functional ankle strength.  Requires tactile cues from therapist on small graded movements and cues for decreasing B knee flex as compensation for poor anterior weight shift.  Performed 2 sets of 10 reps.                  PT Education - 02/22/15 1244    Education provided Yes   Education Details Added corner balance tasks to HEP, see pt instructions.    Person(s) Educated Patient   Methods Explanation   Comprehension Verbalized understanding;Returned demonstration;Need further instruction          PT Short Term Goals - 02/08/15 1422    PT SHORT TERM GOAL #1   Title Pt will perform home exercises with mod I using paper handout to indicate safe HEP compliance and to maximize functional gains. Target date: 02/11/15.   Baseline Still needs cues for technique on all exercises, will go over again next visit.    Status Not Met   PT SHORT TERM GOAL #2   Title Pt/wife will report consistent participation in home walking program (3-5 days/week at minimum) to facilitate improved endurance, activity tolerance. Target date: 02/11/15.   Baseline Pt reports that increased walking causes increased pain. Pt reports he will begin to attempt again.    Status Not Met   PT SHORT TERM GOAL #3   Title Pt will  increase 6 Minute Walk distance from 693' to 750' to indicate progress towrd significant improvement in functional endurance. Target date: 02/11/15.   Baseline 948' on 8/30   Status Achieved   PT SHORT TERM GOAL #4   Title Pt will negotiate 12 stairs with 2 rails and mod I to progress toward pt ability to safely access second floor of home without wife providing supervision/assistance. Target date: 02/11/15.   Baseline met 8/30   Status Achieved   PT SHORT TERM GOAL #5  Title Perform Berg Balance Scale to assess/address functional standing balance and fall risk. Target date: 02/11/15.   Baseline Scored 39/56 on 8/9.   Status Achieved           PT Long Term Goals - 02/08/15 1445    PT LONG TERM GOAL #1   Title Pt will increase 6 Minute Walk Test distance from 693' to 806' to indicate significant improvement in functional endurance. Target date: 03/11/15   Baseline 948' on 8/30    Status Achieved   PT LONG TERM GOAL #2   Title Pt/wife will verbalize understanding of safe fitness regimen for gym progress toward pt-stated goal of being able to safely exercise at gym. Target date: 03/11/15   Status On-going   PT LONG TERM GOAL #3   Title Pt will report no more than 2-point increase in within-session pain rating to indicate ability to perform functional mobility without limitation of pain. Target date: 03/11/15   Status On-going   PT LONG TERM GOAL #4   Title Pt will increase Berg Balance score by 8 points from baseline to indicate significant improvement in functional standing balance. Target date: 03/11/15   Baseline 8/9: baseline Berg score = 39/56   Status On-going   PT LONG TERM GOAL #5   Title Pt/wife will verbalize understanding of CVA warning signs to indicate understanding of situations in which to seek medical attention. Target date: 03/11/15   Status On-going   PT LONG TERM GOAL #6   Title Pt will ambulate > 200' over level surfaces with mod I to indicate increased  stability/independence with household mobility. Target date: 03/11/15   Status On-going   PT LONG TERM GOAL #7   Title Pt will ambulate > 300' over unlevel, paved surfaces with mod I using LRAF to indicate increased safety/independence with community mobility, return to PLOF. Target date: 03/11/15   Status On-going   PT LONG TERM GOAL #8   Title Pt will negotiate standard ramp and curb with mod I using LRAD to indicate increased safety/independence negotiating community obstacles, return to PLOF. Target date: 03/11/15   Status On-going   PT LONG TERM GOAL  #9   TITLE Pt will negotiate 26 stairs with 2 rails requiring single standing rest break (< 1 minute duration) with mod I, increased time to enable pt to safely access second floor of home. Target date: 03/11/15   Status On-going               Plan - 02/22/15 1245    Clinical Impression Statement Skilled session addressed pts ability to perform anterior/posterior weight shifts, graded movement in these positions, ankle and hip strategies, high level balance on compliant surface and addition of balance exercises to HEP.  Pt tolerated well, SaO2 97% during session, however pt seemed somewhat more dyspneic today (2/5 on MRC dyspnea scale).  Continue POC.    Pt will benefit from skilled therapeutic intervention in order to improve on the following deficits Abnormal gait;Decreased endurance;Decreased activity tolerance;Decreased balance;Decreased cognition;Decreased mobility;Decreased strength;Decreased safety awareness;Decreased knowledge of use of DME   Rehab Potential Good   PT Frequency 2x / week   PT Duration 8 weeks   PT Treatment/Interventions ADLs/Self Care Home Management;Functional mobility training;Stair training;Gait training;DME Instruction;Therapeutic activities;Therapeutic exercise;Balance training;Neuromuscular re-education;Patient/family education;Orthotic Fit/Training;Vestibular;Manual techniques   PT Next Visit Plan Review new  additions to HEP from 02/22/15.  Continue ant/post weight shifting, as pt has tendency to lean in post. direction. Continue ankle strengthening/balance training on RLE (esp  on compliant surface), on compliant surfaces, breathing techniques during mobility   Consulted and Agree with Plan of Care Patient        Problem List Patient Active Problem List   Diagnosis Date Noted  . 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 CVA (cerebral infarction) 12/31/2014  . Right hand weakness 12/31/2014  . TIA (transient ischemic attack) 12/31/2014  . Advanced directives, counseling/discussion 04/19/2014  . Spinal stenosis, lumbar region, with neurogenic claudication 12/23/2012  . Osteoarthritis, multiple sites 10/08/2012  . Routine general medical examination at a health care facility 02/25/2012  . NSTEMI (non-ST elevated myocardial infarction) 12/28/2011  . Chronic diastolic HF (heart failure) 12/28/2011    Class: Acute  . Frequent falls 12/28/2011  . Allergic rhinitis 11/11/2008  . Hyperlipemia 04/14/2008  . Anemia 04/14/2008  . Episodic mood disorder 04/14/2008  . Essential hypertension 04/14/2008  . Coronary atherosclerosis 04/14/2008  . Atrial fibrillation 04/14/2008  . COPD (chronic obstructive pulmonary disease) with emphysema 04/14/2008    Cameron Sprang, PT, MPT [facility] [address] [place], [place], [address] Phone: [phone]   Fax:  [phone] 02/22/2015, 12:51 PM

## 2015-02-23 NOTE — Telephone Encounter (Signed)
-----   Message from Belva Crome, MD sent at 02/19/2015  4:18 PM EDT ----- Let them know rate is in a very safe range on current therapy. No further changes needed.

## 2015-02-23 NOTE — Telephone Encounter (Signed)
Pt aware of holter monitor results with verbal understanding.

## 2015-02-24 ENCOUNTER — Ambulatory Visit: Payer: PPO | Admitting: Physical Therapy

## 2015-02-24 ENCOUNTER — Ambulatory Visit: Payer: PPO | Admitting: Speech Pathology

## 2015-02-24 ENCOUNTER — Ambulatory Visit: Payer: PPO | Admitting: Occupational Therapy

## 2015-02-24 ENCOUNTER — Encounter: Payer: Self-pay | Admitting: Occupational Therapy

## 2015-02-24 DIAGNOSIS — R41841 Cognitive communication deficit: Secondary | ICD-10-CM

## 2015-02-24 DIAGNOSIS — M6281 Muscle weakness (generalized): Secondary | ICD-10-CM | POA: Diagnosis not present

## 2015-02-24 DIAGNOSIS — G8191 Hemiplegia, unspecified affecting right dominant side: Secondary | ICD-10-CM

## 2015-02-24 DIAGNOSIS — IMO0002 Reserved for concepts with insufficient information to code with codable children: Secondary | ICD-10-CM

## 2015-02-24 NOTE — Therapy (Signed)
[facility] [address] [place], [place], [address] Phone: [phone]   Fax:  [phone]  Occupational Therapy Treatment  Patient Details  Name: Eduardo [name] MRN: [id number] Date of Birth: [birth year] Referring Provider:  Venia Carbon, MD  Encounter Date: 02/24/2015      OT End of Session - 02/24/15 1503    Visit Number 11   Number of Visits 16   Date for OT Re-Evaluation 03/11/15   Authorization Type [employer] - assume medicare managed plan and will need G code!!!   Authorization Time Period 01/14/2015-03/11/2015   Authorization - Visit Number 11   Authorization - Number of Visits 11   OT Start Time [id number]   OT Stop Time 1400   OT Time Calculation (min) 43 min   Activity Tolerance Patient tolerated treatment well   Behavior During Therapy WFL for tasks assessed/performed      Past Medical History  Diagnosis Date  . Anemia   . Anxiety   . Atrial fibrillation   . CAD (coronary artery disease)   . Hyperlipidemia   . COPD (chronic obstructive pulmonary disease)   . Allergy   . Osteoarthritis   . Hx of colonic polyps   . Hyperplastic polyp of intestine 2001  . Adenomatous polyp 2001  . Hypertension 12/25/11    pt denies this history  . Myocardial infarction 06/2010    "twice; 1 day apart; during/after cath"  . Exertional dyspnea   . S/P appy   . Arrhythmia     Past Surgical History  Procedure Laterality Date  . Appendectomy    . Carpal tunnel release      left  . Mastoidectomy      x3 on right  . Pilonidal cyst / sinus excision    . Knee arthroscopy      left, right knee 10/2009  . Shoulder arthroscopy      right  . Foot fracture surgery      right heel repair  . Nasal septum surgery      nasal septal repair  . Penile prosthesis implant      x 2--got infection after 2nd and had to remove  . Cataract extraction w/ intraocular lens implant  2/13    Dr [name]  . Carpal  tunnel release  10/11/11    Dr Rush Barer  . Tonsillectomy and adenoidectomy    . Fracture surgery    . Cardiac catheterization  2012    50% LAD and luminal irreg in RCA, 1/12  Post cath MI from damage  . Penile prosthesis  removal      There were no vitals filed for this visit.  Visit Diagnosis:  Lack of coordination due to stroke  Hemiplegia affecting right dominant side      Subjective Assessment - 02/24/15 1344    Subjective  Patient shared homework from speech therapy in which he printed answers.  Printing very legible.   Pertinent History see epic snapshot   Patient Stated Goals get my hand working better   Currently in Pain? No/denies   Pain Score 0-No pain                      OT Treatments/Exercises (OP) - 02/24/15 0001    ADLs   Eating Patient reports that he has slowed down considerably when eating cold cereal, and has noticed less food dropping from spoon.  Discussed strategy to hold spoon closer to the base -  to increase strength of position.     Writing Practiced handwriting with emphasis on speed and size of letters.  Patient prefers cursive writing for check writing, and signatures.  Patient tends to print for other writing tasks.  Overall legibility improving considerably, especially when he reduces speed.     Fine Motor Coordination   In Hand Manipulation Training Worked on rotating steel balls clockwise and counterclockwise within his right hand.  Patient better able to complete this task, and increased challenge by completing this rotation while walking - without device.  Patient needed to completely stop walking when changing direction of rotation.                  OT Education - 02/24/15 1503    Education provided Yes   Education Details Reviewed coordination exercises, patient is completing at home.   Person(s) Educated Patient   Methods Explanation   Comprehension Verbalized understanding          OT Short Term Goals -  02/22/15 1302    OT SHORT TERM GOAL #1   Title Pt will be mod I with HEP - 02/11/2115   Status Achieved   OT SHORT TERM GOAL #2   Title Pt will demonstrate improved grip strenth in R hand by at least 5 pounds (baseline=45 pounds) to assist with functional tasks   Status Achieved  60 lbs   OT SHORT TERM GOAL #3   Title Pt will demonstrate improved pinch strength R hand by at least 2 pounds combined (baseline = 31) to assist with functional tasks   Status Achieved  02/21/2015 combined = 38 pounds   OT SHORT TERM GOAL #4   Title Pt will be mod I with cutting meat, AE prn   Status Achieved   OT SHORT TERM GOAL #5   Title Pt will demonstrate mod I with selective attention for basic familiar functional tasks   Status Achieved   OT SHORT TERM GOAL #6   Title Pt will complete at least 10 minutes of functional activity without rest breaks   Status Achieved  per pt report   OT SHORT TERM GOAL #7   Title Pt will demonstrate ability to write at 2 sentence level with 80% legibility with AE prn   Status Achieved           OT Long Term Goals - 02/22/15 1303    OT LONG TERM GOAL #1   Title Pt will be mod I with upgraded HEP- 03/11/2015   Status On-going   OT LONG TERM GOAL #2   Title Pt will demonstrate mod I with alternating attention with basic familiar functional tasks   Status On-going   OT LONG TERM GOAL #3   Title Pt will demonstrate improved grip strength in R hand by at least 8 pounds (baseline= 45) to assist with functional tasks   Status Achieved  60 pounds.Pt feels strength is at basline   OT LONG TERM GOAL #4   Title Pt will be mod I with simple home mgmt tasks   Status Achieved  pt is cooking breakfast, doing dishes, sweeping floor, etc   OT LONG TERM GOAL #5   Title Pt will be able to complete 20 minutes of activity without rest break   Status Achieved  pt able to complete shower, dressing and hot meal prep before sitting down   OT LONG TERM GOAL #6   Title Pt will  demonstrate improved coordination as evidenced by decreasing time  on 9 hole peg test by at least 10 seconds (baseline = 53.43) to assist with functional tasks   Status On-going  44 on 02/21/2015   OT LONG TERM GOAL #7   Title Pt will demonstrate ability to write at 3 sentence level with 100% legibility with  AE prn   Status On-going               Plan - 02/24/15 1504    Clinical Impression Statement Patient improving with coordination in right hand, and is reporting improved performance with functional use of right hand for eating, writing, etc.     Pt will benefit from skilled therapeutic intervention in order to improve on the following deficits (Retired) Decreased activity tolerance;Decreased balance;Decreased cognition;Decreased mobility;Decreased endurance;Decreased coordination;Decreased safety awareness;Decreased strength;Impaired UE functional use   Rehab Potential Good   OT Frequency 2x / week   OT Duration 8 weeks   OT Treatment/Interventions Self-care/ADL training;Fluidtherapy;Therapeutic exercise;Neuromuscular education;Manual Therapy;Functional Mobility Training;DME and/or AE instruction;Cognitive remediation/compensation;Therapeutic activities;Patient/family education;Balance training   Plan cognitive goal - check, coordination, and strengthening right hand   Consulted and Agree with Plan of Care Patient        Problem List Patient Active Problem List   Diagnosis Date Noted  . 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 CVA (cerebral infarction) 12/31/2014  . Right hand weakness 12/31/2014  . TIA (transient ischemic attack) 12/31/2014  . Advanced directives, counseling/discussion 04/19/2014  . Spinal stenosis, lumbar region, with neurogenic claudication 12/23/2012  . Osteoarthritis, multiple sites 10/08/2012  . Routine general medical examination at a health care facility 02/25/2012  . NSTEMI (non-ST elevated myocardial infarction) 12/28/2011   . Chronic diastolic HF (heart failure) 12/28/2011    Class: Acute  . Frequent falls 12/28/2011  . Allergic rhinitis 11/11/2008  . Hyperlipemia 04/14/2008  . Anemia 04/14/2008  . Episodic mood disorder 04/14/2008  . Essential hypertension 04/14/2008  . Coronary atherosclerosis 04/14/2008  . Atrial fibrillation 04/14/2008  . COPD (chronic obstructive pulmonary disease) with emphysema 04/14/2008    Mariah Milling, OTR/L 02/24/2015, 3:07 PM  [facility] [address] [place], [place], [address] Phone: [phone]   Fax:  [phone]

## 2015-02-24 NOTE — Patient Instructions (Signed)
Homework provided

## 2015-02-24 NOTE — Therapy (Signed)
[facility] [address] [place], [place], [address] Phone: [phone]   Fax:  [phone]  Speech Language Pathology Treatment  Patient Details  Name: Eduardo [name] MRN: [id number] Date of Birth: July 28, 1930 Referring Provider:  Venia Carbon, MD  Encounter Date: 02/24/2015      End of Session - 02/24/15 1314    Visit Number 6   Number of Visits 16   Date for SLP Re-Evaluation 04/05/15   Authorization Type G - CODE!!!   SLP Start Time [id number]   SLP Stop Time  1314   SLP Time Calculation (min) 44 min   Activity Tolerance Patient tolerated treatment well      Past Medical History  Diagnosis Date  . Anemia   . Anxiety   . Atrial fibrillation   . CAD (coronary artery disease)   . Hyperlipidemia   . COPD (chronic obstructive pulmonary disease)   . Allergy   . Osteoarthritis   . Hx of colonic polyps   . Hyperplastic polyp of intestine 2001  . Adenomatous polyp 2001  . Hypertension 12/25/11    pt denies this history  . Myocardial infarction 06/2010    "twice; 1 day apart; during/after cath"  . Exertional dyspnea   . S/P appy   . Arrhythmia     Past Surgical History  Procedure Laterality Date  . Appendectomy    . Carpal tunnel release      left  . Mastoidectomy      x3 on right  . Pilonidal cyst / sinus excision    . Knee arthroscopy      left, right knee 10/2009  . Shoulder arthroscopy      right  . Foot fracture surgery      right heel repair  . Nasal septum surgery      nasal septal repair  . Penile prosthesis implant      x 2--got infection after 2nd and had to remove  . Cataract extraction w/ intraocular lens implant  2/13    Dr [name]  . Carpal tunnel release  10/11/11    Dr Rush Barer  . Tonsillectomy and adenoidectomy    . Fracture surgery    . Cardiac catheterization  2012    50% LAD and luminal irreg in RCA, 1/12  Post cath MI from damage  . Penile prosthesis   removal      There were no vitals filed for this visit.  Visit Diagnosis: Cognitive communication deficit      Subjective Assessment - 02/24/15 1234    Subjective "Once I could put my mind to it, I did the homework easily"               ADULT SLP TREATMENT - 02/24/15 1234    General Information   Behavior/Cognition Cooperative;Pleasant mood;Distractible;Alert   Treatment Provided   Treatment provided Cognitive-Linquistic   Pain Assessment   Pain Assessment No/denies pain   Cognitive-Linquistic Treatment   Treatment focused on Cognition   Skilled Treatment Pt reported  he was able to do his homework wit relative ease - Facilitated alternating attention and attention to details with pt reading bank schedule chart and answering detail quesstions re: chart with occasional cues to id errors with details and rare min A for math to solve problems, with 90% accuracy on reading chart, attending to details and solving problems.  Pt divided attention between category matrices and naming bills and coins that  add up to given amount  with primarily alternating attention, requesting repetition of the amount frequently.  Pt verbalized frustration with needing money amout repeated frequently. He completed matrices with 95% accuracy and spervision cues.    Assessment / Recommendations / Plan   Plan Continue with current plan of care   Progression Toward Goals   Progression toward goals Progressing toward goals            SLP Short Term Goals - 02/24/15 1313    SLP SHORT TERM GOAL #1   Title pt will demo alternating attention skills in mod complex cognitive-linguistic tasks with 95% success on both tasks over 3 sessions   Time 2   Period Weeks   Status On-going   SLP SHORT TERM GOAL #2   Title pt will demo emergent awareness demo'd by correcting mod complex cognitive linguistic tasks 100% of the time during task, over two therapy sessions   Time 2   Period Weeks   Status On-going           SLP Long Term Goals - 02/24/15 1314    SLP LONG TERM GOAL #1   Title pt will demo divided attention in functional cognitive-linguistic tasks with 90% success each task over two sessions   Time 6   Period Weeks   Status On-going   SLP LONG TERM GOAL #2   Title pt will demo anticipatory awareness and review cognitive linguistic therapy tasks 100% of the time in 3 therapy sessions   Time 6   Period Weeks   Status On-going          Plan - 02/24/15 1313    Clinical Impression Statement Pt with skilled ST cont necessary to maximize higher level cognitive linguistic skills like alternating and divided attention and emergent awareness. Pt's emergent awareness during checking his work has improved this week.   Speech Therapy Frequency 2x / week   Treatment/Interventions Patient/family education;Compensatory strategies;Internal/external aids;Cognitive reorganization;SLP instruction and feedback;Functional tasks;Cueing hierarchy   Potential to Achieve Goals Good   Consulted and Agree with Plan of Care Patient        Problem List Patient Active Problem List   Diagnosis Date Noted  . 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 CVA (cerebral infarction) 12/31/2014  . Right hand weakness 12/31/2014  . TIA (transient ischemic attack) 12/31/2014  . Advanced directives, counseling/discussion 04/19/2014  . Spinal stenosis, lumbar region, with neurogenic claudication 12/23/2012  . Osteoarthritis, multiple sites 10/08/2012  . Routine general medical examination at a health care facility 02/25/2012  . NSTEMI (non-ST elevated myocardial infarction) 12/28/2011  . Chronic diastolic HF (heart failure) 12/28/2011    Class: Acute  . Frequent falls 12/28/2011  . Allergic rhinitis 11/11/2008  . Hyperlipemia 04/14/2008  . Anemia 04/14/2008  . Episodic mood disorder 04/14/2008  . Essential hypertension 04/14/2008  . Coronary atherosclerosis 04/14/2008  . Atrial  fibrillation 04/14/2008  . COPD (chronic obstructive pulmonary disease) with emphysema 04/14/2008    Najah Liverman, Annye Rusk MS, CCC-SLP 02/24/2015, 1:15 PM  [facility] [address] [place], [place], [address] Phone: [phone]   Fax:  [phone]

## 2015-03-01 ENCOUNTER — Encounter: Payer: Self-pay | Admitting: Neurology

## 2015-03-01 ENCOUNTER — Ambulatory Visit: Payer: PPO | Admitting: Physical Therapy

## 2015-03-01 ENCOUNTER — Encounter: Payer: Self-pay | Admitting: Occupational Therapy

## 2015-03-01 ENCOUNTER — Ambulatory Visit: Payer: PPO | Admitting: Speech Pathology

## 2015-03-01 ENCOUNTER — Ambulatory Visit (INDEPENDENT_AMBULATORY_CARE_PROVIDER_SITE_OTHER): Payer: PPO | Admitting: Neurology

## 2015-03-01 ENCOUNTER — Ambulatory Visit: Payer: PPO | Admitting: Occupational Therapy

## 2015-03-01 ENCOUNTER — Telehealth: Payer: Self-pay | Admitting: Neurology

## 2015-03-01 VITALS — HR 77

## 2015-03-01 VITALS — BP 119/61 | HR 76 | Ht 63.0 in | Wt 159.4 lb

## 2015-03-01 DIAGNOSIS — G8191 Hemiplegia, unspecified affecting right dominant side: Secondary | ICD-10-CM

## 2015-03-01 DIAGNOSIS — I69351 Hemiplegia and hemiparesis following cerebral infarction affecting right dominant side: Secondary | ICD-10-CM | POA: Insufficient documentation

## 2015-03-01 DIAGNOSIS — M6281 Muscle weakness (generalized): Secondary | ICD-10-CM

## 2015-03-01 DIAGNOSIS — Z7901 Long term (current) use of anticoagulants: Secondary | ICD-10-CM | POA: Diagnosis not present

## 2015-03-01 DIAGNOSIS — I10 Essential (primary) hypertension: Secondary | ICD-10-CM

## 2015-03-01 DIAGNOSIS — IMO0002 Reserved for concepts with insufficient information to code with codable children: Secondary | ICD-10-CM

## 2015-03-01 DIAGNOSIS — R2681 Unsteadiness on feet: Secondary | ICD-10-CM

## 2015-03-01 DIAGNOSIS — R269 Unspecified abnormalities of gait and mobility: Secondary | ICD-10-CM

## 2015-03-01 DIAGNOSIS — I482 Chronic atrial fibrillation, unspecified: Secondary | ICD-10-CM

## 2015-03-01 DIAGNOSIS — Z7409 Other reduced mobility: Secondary | ICD-10-CM

## 2015-03-01 DIAGNOSIS — R41841 Cognitive communication deficit: Secondary | ICD-10-CM

## 2015-03-01 DIAGNOSIS — E785 Hyperlipidemia, unspecified: Secondary | ICD-10-CM | POA: Insufficient documentation

## 2015-03-01 DIAGNOSIS — I63132 Cerebral infarction due to embolism of left carotid artery: Secondary | ICD-10-CM

## 2015-03-01 NOTE — Therapy (Signed)
[facility] [address] [place], [place], [address] Phone: [phone]   Fax:  [phone]  Occupational Therapy Treatment  Patient Details  Name: Eduardo [name] MRN: [id number] Date of Birth: 03-09-1931 Referring Colonel Krauser:  Venia Carbon, MD  Encounter Date: 03/01/2015      OT End of Session - 03/01/15 1428    Visit Number 12   Date for OT Re-Evaluation 03/11/15   Authorization Type [employer] - assume medicare managed plan and will need G code!!! copay with unlimited visits   Authorization Time Period 01/14/2015-03/11/2015   OT Start Time 1103   OT Stop Time 1145   OT Time Calculation (min) 42 min   Activity Tolerance Patient tolerated treatment well      Past Medical History  Diagnosis Date  . Anemia   . Anxiety   . Atrial fibrillation   . CAD (coronary artery disease)   . Hyperlipidemia   . COPD (chronic obstructive pulmonary disease)   . Allergy   . Osteoarthritis   . Hx of colonic polyps   . Hyperplastic polyp of intestine 2001  . Adenomatous polyp 2001  . Myocardial infarction 06/2010    "twice; 1 day apart; during/after cath"  . Exertional dyspnea   . S/P appy   . Arrhythmia   . Stroke     Past Surgical History  Procedure Laterality Date  . Appendectomy    . Carpal tunnel release      left  . Mastoidectomy      x3 on right  . Pilonidal cyst / sinus excision    . Knee arthroscopy      left, right knee 10/2009  . Shoulder arthroscopy      right  . Foot fracture surgery      right heel repair  . Nasal septum surgery      nasal septal repair  . Penile prosthesis implant      x 2--got infection after 2nd and had to remove  . Cataract extraction w/ intraocular lens implant  2/13    Dr [name]  . Carpal tunnel release  10/11/11    Dr Rush Barer  . Tonsillectomy and adenoidectomy    . Fracture surgery    . Cardiac catheterization  2012    50% LAD and luminal  irreg in RCA, 1/12  Post cath MI from damage  . Penile prosthesis  removal      There were no vitals filed for this visit.  Visit Diagnosis:  Lack of coordination due to stroke  Hemiplegia affecting right dominant side  Muscle weakness  Impaired functional mobility and activity tolerance      Subjective Assessment - 03/01/15 1308    Subjective  I was alone all weekend as my wife went to [place] and I did great!   Pertinent History see epic snapshot   Patient Stated Goals get my hand working better   Currently in Pain? No/denies                      OT Treatments/Exercises (OP) - 03/01/15 1421    ADLs   Writing Practiced writing in more "real live situation" with pt taking quick notes for telephone messages - legibility decreases to 80% when pt has to write quickly.  Will continue to address   Financial Management     Cognitive Exercises   Attention Span Alternating Practiced alternating between 2 functional tasks (working on organizational task then having to  stop to take a telephone message )  Pt with 95% accuracy for detail with telephone messages and 100% accuracy for complex organizational task. Pt with significant improvement in alternating attention with complex functional tasks. Pt reports his wife has verbalized that pt is paying attention better at home and therefore recalling more details.                   OT Short Term Goals - 03/01/15 1426    OT SHORT TERM GOAL #1   Title Pt will be mod I with HEP - 02/11/2115   Status Achieved   OT SHORT TERM GOAL #2   Title Pt will demonstrate improved grip strenth in R hand by at least 5 pounds (baseline=45 pounds) to assist with functional tasks   Status Achieved  60 lbs   OT SHORT TERM GOAL #3   Title Pt will demonstrate improved pinch strength R hand by at least 2 pounds combined (baseline = 31) to assist with functional tasks   Status Achieved  02/21/2015 combined = 38 pounds   OT SHORT TERM GOAL  #4   Title Pt will be mod I with cutting meat, AE prn   Status Achieved   OT SHORT TERM GOAL #5   Title Pt will demonstrate mod I with selective attention for basic familiar functional tasks   Status Achieved   OT SHORT TERM GOAL #6   Title Pt will complete at least 10 minutes of functional activity without rest breaks   Status Achieved  per pt report   OT SHORT TERM GOAL #7   Title Pt will demonstrate ability to write at 2 sentence level with 80% legibility with AE prn   Status Achieved           OT Long Term Goals - 03/01/15 1426    OT LONG TERM GOAL #1   Title Pt will be mod I with upgraded HEP- 03/11/2015   Status Achieved   OT LONG TERM GOAL #2   Title Pt will demonstrate mod I with alternating attention with basic familiar functional tasks   Status Achieved   OT LONG TERM GOAL #3   Title Pt will demonstrate improved grip strength in R hand by at least 8 pounds (baseline= 45) to assist with functional tasks   Status Achieved  60 pounds.Pt feels strength is at basline   OT LONG TERM GOAL #4   Title Pt will be mod I with simple home mgmt tasks   Status Achieved  pt is cooking breakfast, doing dishes, sweeping floor, etc   OT LONG TERM GOAL #5   Title Pt will be able to complete 20 minutes of activity without rest break   Status Achieved  pt able to complete shower, dressing and hot meal prep before sitting down   OT LONG TERM GOAL #6   Title Pt will demonstrate improved coordination as evidenced by decreasing time on 9 hole peg test by at least 10 seconds (baseline = 53.43) to assist with functional tasks   Status On-going  44 on 02/21/2015   OT LONG TERM GOAL #7   Title Pt will demonstrate ability to write at 3 sentence level with 100% legibility with  AE prn   Status On-going               Plan - 03/01/15 1426    Clinical Impression Statement Pt continues to make excellent progress and reports he stayed by himself this weekend while his wife  went to [place].  Pt reports the airlines called several times with changes in flights and pt was able to keep all details accurate and write himself notes when they called.    Pt will benefit from skilled therapeutic intervention in order to improve on the following deficits (Retired) Decreased activity tolerance;Decreased balance;Decreased cognition;Decreased mobility;Decreased endurance;Decreased coordination;Decreased safety awareness;Decreased strength;Impaired UE functional use   Rehab Potential Good   OT Frequency 2x / week   OT Duration 8 weeks   OT Treatment/Interventions Self-care/ADL training;Fluidtherapy;Therapeutic exercise;Neuromuscular education;Manual Therapy;Functional Mobility Training;DME and/or AE instruction;Cognitive remediation/compensation;Therapeutic activities;Patient/family education;Balance training   Plan focus on writing, coordination and strength in R hand   [place] added coordination exercise   Consulted and Agree with Plan of Care Patient        Problem List Patient Active Problem List   Diagnosis Date Noted  . 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 CVA (cerebral infarction) 12/31/2014  . Right hand weakness 12/31/2014  . TIA (transient ischemic attack) 12/31/2014  . Advanced directives, counseling/discussion 04/19/2014  . Spinal stenosis, lumbar region, with neurogenic claudication 12/23/2012  . Osteoarthritis, multiple sites 10/08/2012  . Routine general medical examination at a health care facility 02/25/2012  . NSTEMI (non-ST elevated myocardial infarction) 12/28/2011  . Chronic diastolic HF (heart failure) 12/28/2011    Class: Acute  . Frequent falls 12/28/2011  . Allergic rhinitis 11/11/2008  . Hyperlipemia 04/14/2008  . Anemia 04/14/2008  . Episodic mood disorder 04/14/2008  . Essential hypertension 04/14/2008  . Coronary atherosclerosis 04/14/2008  . Atrial fibrillation 04/14/2008  . COPD (chronic obstructive pulmonary  disease) with emphysema 04/14/2008    Quay Burow, OTR/L 03/01/2015, 2:31 PM  [facility] [address] [place], [place], [address] Phone: [phone]   Fax:  [phone]

## 2015-03-01 NOTE — Therapy (Signed)
[facility] [address] [place], [place], [address] Phone: [phone]   Fax:  [phone]  Physical Therapy Treatment  Patient Details  Name: Eduardo [name] MRN: [id number] Date of Birth: February 20, 1931 Referring Provider:  Venia Carbon, MD  Encounter Date: 03/01/2015      PT End of Session - 03/01/15 1246    Visit Number 12   Number of Visits 17   Date for PT Re-Evaluation 03/15/15   Authorization Type G-Code every 10th visit.   PT Start Time 1150   PT Stop Time 1230   PT Time Calculation (min) 40 min   Activity Tolerance Patient tolerated treatment well   Behavior During Therapy WFL for tasks assessed/performed      Past Medical History  Diagnosis Date  . Anemia   . Anxiety   . Atrial fibrillation   . CAD (coronary artery disease)   . Hyperlipidemia   . COPD (chronic obstructive pulmonary disease)   . Allergy   . Osteoarthritis   . Hx of colonic polyps   . Hyperplastic polyp of intestine 2001  . Adenomatous polyp 2001  . Hypertension 12/25/11    pt denies this history  . Myocardial infarction 06/2010    "twice; 1 day apart; during/after cath"  . Exertional dyspnea   . S/P appy   . Arrhythmia     Past Surgical History  Procedure Laterality Date  . Appendectomy    . Carpal tunnel release      left  . Mastoidectomy      x3 on right  . Pilonidal cyst / sinus excision    . Knee arthroscopy      left, right knee 10/2009  . Shoulder arthroscopy      right  . Foot fracture surgery      right heel repair  . Nasal septum surgery      nasal septal repair  . Penile prosthesis implant      x 2--got infection after 2nd and had to remove  . Cataract extraction w/ intraocular lens implant  2/13    Dr [name]  . Carpal tunnel release  10/11/11    Dr Rush Barer  . Tonsillectomy and adenoidectomy    . Fracture surgery    . Cardiac catheterization  2012    50% LAD and luminal irreg in  RCA, 1/12  Post cath MI from damage  . Penile prosthesis  removal      Filed Vitals:   03/01/15 1246  Pulse: 77  SpO2: 98%    Visit Diagnosis:  Abnormality of gait  Unsteadiness  Hemiplegia affecting right dominant side      Subjective Assessment - 03/01/15 1230    Subjective Pt reporting no falls. Reports he was able to "walk from the second terminal to the [employer]; my wife said, 'you aren't even breathing hard'." Pt reports plan to have spinal injection due to ongoing pain associated with spinal stenosis.   Pertinent History Chronic atrial fibrillation, combined systolic and diastolic heart failure, CAD, HTN, spinal stenosis. * Notify cardiologist if HR < 50 bpm   Limitations House hold activities;Walking   Patient Stated Goals "Improve my endurance, get better balance, walk like I used to, and get back to my regimen."   Currently in Pain? No/denies            [facility] PT Assessment - 03/01/15 0001    Berg Balance Test   Sit to Stand Able to stand without using hands  and stabilize independently   Standing Unsupported Able to stand safely 2 minutes   Sitting with Back Unsupported but Feet Supported on Floor or Stool Able to sit safely and securely 2 minutes   Stand to Sit Sits safely with minimal use of hands   Transfers Able to transfer safely, minor use of hands   Standing Unsupported with Eyes Closed Able to stand 10 seconds with supervision  secondary to A/P sway   Standing Ubsupported with Feet Together Able to place feet together independently and stand 1 minute safely   From Standing, Reach Forward with Outstretched Arm Can reach forward >12 cm safely (5")   From Standing Position, Pick up Object from Floor Able to pick up shoe safely and easily   From Standing Position, Turn to Look Behind Over each Shoulder Looks behind one side only/other side shows less weight shift  wt shift to R > L   Turn 360 Degrees Able to turn 360 degrees safely in 4 seconds or less    Standing Unsupported, Alternately Place Feet on Step/Stool Able to stand independently and complete 8 steps >20 seconds   Standing Unsupported, One Foot in Front Able to plae foot ahead of the other independently and hold 30 seconds   Standing on One Leg Tries to lift leg/unable to hold 3 seconds but remains standing independently   Total Score 48                     [facility] Adult PT Treatment/Exercise - 03/01/15 0001    Transfers   Transfers Sit to Stand;Stand to Sit   Sit to Stand 7: Independent   Stand to Sit 7: Independent   Ambulation/Gait   Ambulation/Gait Yes   Ambulation/Gait Assistance 6: Modified independent (Device/Increase time);5: Supervision   Ambulation Distance (Feet) 575 Feet  no rest breaks   Assistive device Straight cane  "hurry-cane"   Gait Pattern Step-through pattern;Decreased step length - left;Decreased stance time - right;Right flexed knee in stance;Trendelenburg;Decreased trunk rotation  R Trendelenberg with increased fatigue    Curb 6: Modified independent (Device/increase time);5: Supervision   Curb Details (indicate cue type and reason) Using [facility], pt descended with supervision, ascended with mod I.   Self-Care   Self-Care Other Self-Care Comments   Other Self-Care Comments  Discussed CVA warning signs, risk factors with pt verbalizing understanding of all prior education.   Neuro Re-ed    Neuro Re-ed Details  Completed Berg with score of 48/56; see Merrilee Jansky for detailed findings.                PT Education - 03/01/15 1239    Education provided Yes   Education Details Berg score, progress, and functional implications.   Person(s) Educated Patient   Methods Explanation   Comprehension Verbalized understanding          PT Short Term Goals - 02/08/15 1422    PT SHORT TERM GOAL #1   Title Pt will perform home exercises with mod I using paper handout to indicate safe HEP compliance and to maximize functional gains. Target date:  02/11/15.   Baseline Still needs cues for technique on all exercises, will go over again next visit.    Status Not Met   PT SHORT TERM GOAL #2   Title Pt/wife will report consistent participation in home walking program (3-5 days/week at minimum) to facilitate improved endurance, activity tolerance. Target date: 02/11/15.   Baseline Pt reports that increased walking causes increased pain.  Pt reports he will begin to attempt again.    Status Not Met   PT SHORT TERM GOAL #3   Title Pt will increase 6 Minute Walk distance from 693' to 750' to indicate progress towrd significant improvement in functional endurance. Target date: 02/11/15.   Baseline 948' on 8/30   Status Achieved   PT SHORT TERM GOAL #4   Title Pt will negotiate 12 stairs with 2 rails and mod I to progress toward pt ability to safely access second floor of home without wife providing supervision/assistance. Target date: 02/11/15.   Baseline met 8/30   Status Achieved   PT SHORT TERM GOAL #5   Title Perform Berg Balance Scale to assess/address functional standing balance and fall risk. Target date: 02/11/15.   Baseline Scored 39/56 on 8/9.   Status Achieved           PT Long Term Goals - 03/01/15 1158    PT LONG TERM GOAL #1   Title Pt will increase 6 Minute Walk Test distance from 693' to 806' to indicate significant improvement in functional endurance. Target date: 03/11/15   Baseline 948' on 8/30    Status Achieved   PT LONG TERM GOAL #2   Title Pt/wife will verbalize understanding of safe fitness regimen for gym progress toward pt-stated goal of being able to safely exercise at gym. Target date: 03/11/15   Status On-going   PT LONG TERM GOAL #3   Title Pt will report no more than 2-point increase in within-session pain rating to indicate ability to perform functional mobility without limitation of pain. Target date: 03/11/15   Status On-going   PT LONG TERM GOAL #4   Title Pt will increase Berg Balance score by 8 points from  baseline to indicate significant improvement in functional standing balance. Target date: 03/11/15   Baseline 8/9: baseline Berg score = 39/56;   9/20: Berg score = 48/56   Status Achieved   PT LONG TERM GOAL #5   Title Pt/wife will verbalize understanding of CVA warning signs to indicate understanding of situations in which to seek medical attention. Target date: 03/11/15   Baseline Met 03/01/15.   Status Achieved   PT LONG TERM GOAL #6   Title Pt will ambulate > 200' over level surfaces with mod I to indicate increased stability/independence with household mobility. Target date: 03/11/15   Baseline Met 03/01/15   Status Achieved   PT LONG TERM GOAL #7   Title Pt will ambulate > 300' over unlevel, paved surfaces with mod I using LRAD to indicate increased safety/independence with community mobility, return to PLOF. Target date: 03/11/15   Baseline Met 03/01/15   Status Achieved   PT LONG TERM GOAL #8   Title Pt will negotiate standard ramp and curb with mod I using LRAD to indicate increased safety/independence negotiating community obstacles, return to PLOF. Target date: 03/11/15   Status On-going   PT LONG TERM GOAL  #9   TITLE Pt will negotiate 26 stairs with 2 rails requiring single standing rest break (< 1 minute duration) with mod I, increased time to enable pt to safely access second floor of home. Target date: 03/11/15   Status On-going               Plan - 03/01/15 1248    Clinical Impression Statement Pt met LTG's for Berg (score increased from 39 to 48/56), household mobility with mod I, community mobility with mod I. Required supervision with curb  step negotiation during this session. Continue per POC.   Pt will benefit from skilled therapeutic intervention in order to improve on the following deficits Abnormal gait;Decreased endurance;Decreased activity tolerance;Decreased balance;Decreased cognition;Decreased mobility;Decreased strength;Decreased safety awareness;Decreased  knowledge of use of DME   Rehab Potential Good   PT Frequency 2x / week   PT Duration 8 weeks   PT Treatment/Interventions ADLs/Self Care Home Management;Functional mobility training;Stair training;Gait training;DME Instruction;Therapeutic activities;Therapeutic exercise;Balance training;Neuromuscular re-education;Patient/family education;Orthotic Fit/Training;Vestibular;Manual techniques   PT Next Visit Plan Review new additions to HEP from 02/22/15. Continue to address weight shifting (A/P and lateral with focus on lateral weight shift to L).   Consulted and Agree with Plan of Care Patient        Problem List Patient Active Problem List   Diagnosis Date Noted  . 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 CVA (cerebral infarction) 12/31/2014  . Right hand weakness 12/31/2014  . TIA (transient ischemic attack) 12/31/2014  . Advanced directives, counseling/discussion 04/19/2014  . Spinal stenosis, lumbar region, with neurogenic claudication 12/23/2012  . Osteoarthritis, multiple sites 10/08/2012  . Routine general medical examination at a health care facility 02/25/2012  . NSTEMI (non-ST elevated myocardial infarction) 12/28/2011  . Chronic diastolic HF (heart failure) 12/28/2011    Class: Acute  . Frequent falls 12/28/2011  . Allergic rhinitis 11/11/2008  . Hyperlipemia 04/14/2008  . Anemia 04/14/2008  . Episodic mood disorder 04/14/2008  . Essential hypertension 04/14/2008  . Coronary atherosclerosis 04/14/2008  . Atrial fibrillation 04/14/2008  . COPD (chronic obstructive pulmonary disease) with emphysema 04/14/2008    Billie Ruddy, PT, DPT [facility] [address] [place], [place], [address] Phone: [phone]   Fax:  [phone] 03/01/2015, 12:54 PM

## 2015-03-01 NOTE — Patient Instructions (Signed)
-   continue eliquis and lipitor for stroke prevention - continue fenofibrate and lipitor for high TG and LDL. Read the information for PCSK9 inhitors. - Follow up with your primary care physician for stroke risk factor modification. Recommend maintain blood pressure goal <130/80, diabetes with hemoglobin A1c goal below 6.5% and lipids with LDL cholesterol goal below 70 mg/dL.  - continue follow up with cardiologist - check BP at home - follow up in 3 months.

## 2015-03-01 NOTE — Telephone Encounter (Signed)
Pt called and wants to know if Dr. Erlinda Hong would want to see MRI of his lower back? Please call and [phone] Pt will be home until 1030 9/20.

## 2015-03-01 NOTE — Progress Notes (Signed)
STROKE NEUROLOGY FOLLOW UP NOTE  NAME: Eduardo [name] DOB: 02/12/1931  REASON FOR VISIT: stroke follow up HISTORY FROM: [name], wife and chart  Today we had Eduardo pleasure of seeing Eduardo [name] in follow-up at our Neurology Clinic. Pt was accompanied by wife.   History Summary Eduardo [name] is a 81 y.o. male with history of atrial fibrillation not on anticoagulation, previous MI, hypertension, COPD, and hyperlipidemia was admitted on 12/30/2014 for right hand weakness. Eduardo [name] did not receive IV t-PA due to mild deficits. MRI showed left frontal and left occipital small infarct, embolic pattern, likely due to A. fib not on anticoagulation. Stroke workup including MRA, carotid Doppler, 2-D echo, were unremarkable. Lipid panel found [name] to have high triglycerides at 801, LDL not able to calculate, A1c 5.8. Eduardo [name] was put on eliquis and increased Lipitor to 20 mg for stroke prevention, continued on fenofibrate for high TG. Eduardo [name] was discharged with outpatient PT OT.  Interval History During Eduardo interval time, Eduardo [name] has been doing well.  No recurrent strokelike symptoms. Eduardo [name] has been working with PT OT for right hand weakness, and muscle strengths much improved. Eduardo [name] has no side effect from an increase, Eduardo [name] is also on aspirin 81 due to previous MI. Blood pressure 119/61 today in clinic. Wife asked for information about PCSK9 inhibitors.   REVIEW OF SYSTEMS: Full 14 system review of systems performed and notable only for those listed below and in HPI above, all others are negative:  Constitutional:   Cardiovascular: Leg swelling Ear/Nose/Throat:  drooling Skin:  Eyes:   Respiratory:  SOB Gastroitestinal:   Genitourinary:  Hematology/Lymphatic:  Bruise easily Endocrine:  Musculoskeletal:  Back pain, aching muscles, walking difficulty Allergy/Immunology:   Neurological:   Psychiatric:  Sleep:   Eduardo following represents Eduardo [name]'s updated allergies and side effects  list: Allergies  Allergen Reactions  . Penicillins Itching  . Sulfonamide Derivatives Other (See Comments)    "it affected my kidneys"    Eduardo neurologically relevant items on Eduardo [name]'s problem list were reviewed on today's visit.  Neurologic Examination  A problem focused neurological exam (12 or more points of Eduardo single system neurologic examination, vital signs counts as 1 point, cranial nerves count for 8 points) was performed.  Blood pressure 119/61, pulse 76, height 5\' 3"  (1.6 m), weight 159 lb 6.4 oz (72.303 kg).  General - Well nourished, well developed, in no apparent distress.  Ophthalmologic - Sharp disc margins OU.  Cardiovascular - irregularly irregular heart rate and rhythm.  Mental Status -  Level of arousal and orientation to time, place, and person were intact. Language including expression, naming, repetition, comprehension was assessed and found intact. Fund of Knowledge was assessed and was intact.  Cranial Nerves II - XII - II - Visual field intact OU. III, IV, VI - Extraocular movements intact. V - Facial sensation intact bilaterally. VII - Facial movement intact bilaterally. VIII - Hearing & vestibular intact bilaterally. X - Palate elevates symmetrically. XI - Chin turning & shoulder shrug intact bilaterally. XII - Tongue protrusion intact.  Motor Strength - Eduardo [name]'s strength was normal in all extremities except very subtle right hand dexterity difficulty and pronator drift was absent.  Bulk was normal and fasciculations were absent.   Motor Tone - Muscle tone was assessed at Eduardo neck and appendages and was normal.  Reflexes - Eduardo [name]'s reflexes were 1+ in all extremities and Eduardo [name] had no pathological reflexes.  Sensory - Light touch,  temperature/pinprick were assessed and were normal.    Coordination - Eduardo [name] had normal movements in Eduardo hands and feet with no ataxia or dysmetria.  Tremor was absent.  Gait and Station - walk with  cane, stable, no tendency to fall.  Data reviewed: I personally reviewed Eduardo images and agree with Eduardo radiology interpretations.  2-D echocardiogram 12/31/2014 Study Conclusions - Left ventricle: Eduardo cavity size was normal. There was severe concentric hypertrophy. Systolic function was normal. Eduardo estimated ejection fraction was in Eduardo range of 60% to 65%. Wall motion was normal; there were no regional wall motion abnormalities. Eduardo study was not technically sufficient to allow evaluation of LV diastolic dysfunction due to atrial fibrillation. - Aortic valve: Trileaflet; moderately thickened, severely calcified leaflets with fixed left coronary cusp. Valve mobility was restricted. There was mild stenosis. There was no regurgitation. Mean gradient (S): 9 mm Hg. Peak gradient (S): 19 mm Hg. Valve area (VTI): 2.07 cm^2. Valve area (Vmax): 1.58 cm^2. Valve area (Vmean): 1.75 cm^2. - Aortic root: Eduardo aortic root was normal in size. - Ascending aorta: Eduardo ascending aorta was normal in size. - Mitral valve: Calcified annulus. Moderately thickened, moderately calcified leaflets . Mobility was restricted. Eduardo findings are consistent with mild stenosis. There was mild regurgitation. Valve area by continuity equation (using LVOT flow): 2.59 cm^2. - Left atrium: Eduardo atrium was severely dilated. - Right ventricle: Systolic function was normal. - Right atrium: Eduardo atrium was mildly dilated. - Tricuspid valve: There was mild regurgitation. - Pulmonary arteries: Systolic pressure was within Eduardo normal range. PA peak pressure: 34 mm Hg (S). - Pericardium, extracardiac: There was no pericardial effusion.  CUS - Bilateral: 1-39% ICA stenosis. Vertebral artery flow is antegrade.  Ct Head Wo Contrast 12/31/2014  No acute intracranial pathology. Age-related atrophy and chronic microvascular ischemic disease. If symptoms persist and there are no contraindications,  MRI may provide better evaluation if clinically indicated.   Mri and Mra Head Wo Contrast 12/31/2014   MRI HEAD  1. Small acute ischemic nonhemorrhagic 10 x 9 x 12 mm cortical infarct involving Eduardo posterior left frontal lobe in Eduardo left precentral gyrus.  2. Additional tiny acute ischemic nonhemorrhagic 5 mm cortical infarct within Eduardo left occipital lobe.  3. Advanced age-related cerebral atrophy with mild chronic microvascular ischemic disease.   MRA HEAD  1. No proximal or large vessel occlusion identified within Eduardo intracranial circulation.  2. No correctable or focal hemosiderin and likely significant stenosis identified.  3. Diminutive left A2 segment with moderate to advanced multi focal atheromatous irregularity.  4. Distal branch atheromatous irregularity within Eduardo MCA branches bilaterally.    Assessment: As you may recall, Eduardo [name] is a 81 y.o. Caucasian male with PMH of afib not on anticoagulation, previous MI, hypertension, COPD, and hyperlipidemia was admitted on 12/30/2014 for right hand weakness. MRI showed left frontal and left occipital small infarct, embolic pattern, likely due to A. fib not on anticoagulation. Stroke workup including MRA, carotid Doppler, 2-D echo, were unremarkable. Lipid panel found [name] to have high triglycerides at 801, LDL not able to calculate, A1c 5.8. Eduardo [name] was put on eliquis and increased Lipitor to 20 mg for stroke prevention, continued on fenofibrate for high TG. Eduardo [name] was discharged with outpatient PT OT. During Eduardo interval time, Eduardo [name] is doing well, right hand weakness almost resolved.  Plan:  - continue eliquis and lipitor for stroke prevention - continue fenofibrate and lipitor for high TG and LDL. Provided information for PCSK9 inhitors. -  Follow up with your primary care physician for stroke risk factor modification. Recommend maintain blood pressure goal <130/80, diabetes with hemoglobin A1c goal below 6.5% and lipids with LDL cholesterol  goal below 70 mg/dL.  - continue follow up with cardiology for A. fib management - check BP at home - RTC in 3 months.  I spent more than 25 minutes of face to face time with Eduardo [name]. Greater than 50% of time was spent in counseling and coordination of care. We have discussed about management of hyperlipidemia, stroke prevention, and Eduardo importance of continued PT OT.   No orders of Eduardo defined types were placed in this encounter.    Meds ordered this encounter  Medications  . aspirin 81 MG tablet    Sig: Take 81 mg by mouth daily.  . B Complex-C (SUPER B COMPLEX PO)    Sig: Take by mouth.    [name] Instructions  - continue eliquis and lipitor for stroke prevention - continue fenofibrate and lipitor for high TG and LDL. Read Eduardo information for PCSK9 inhitors. - Follow up with your primary care physician for stroke risk factor modification. Recommend maintain blood pressure goal <130/80, diabetes with hemoglobin A1c goal below 6.5% and lipids with LDL cholesterol goal below 70 mg/dL.  - continue follow up with cardiologist - check BP at home - follow up in 3 months.   Rosalin Hawking, MD PhD [facility] [address] [place], [place] [address] [phone]

## 2015-03-01 NOTE — Therapy (Signed)
[facility] [address] [place], [place], [address] Phone: [phone]   Fax:  [phone]  Speech Language Pathology Treatment  Patient Details  Name: Eduardo [name] MRN: [id number] Date of Birth: May 03, 1931 Referring Provider:  Venia Carbon, MD  Encounter Date: 03/01/2015      End of Session - 03/01/15 1317    Visit Number 7   Number of Visits 16   Date for SLP Re-Evaluation 04/05/15   SLP Start Time 1232   SLP Stop Time  1316   SLP Time Calculation (min) 44 min   Activity Tolerance Patient tolerated treatment well      Past Medical History  Diagnosis Date  . Anemia   . Anxiety   . Atrial fibrillation   . CAD (coronary artery disease)   . Hyperlipidemia   . COPD (chronic obstructive pulmonary disease)   . Allergy   . Osteoarthritis   . Hx of colonic polyps   . Hyperplastic polyp of intestine 2001  . Adenomatous polyp 2001  . Hypertension 12/25/11    pt denies this history  . Myocardial infarction 06/2010    "twice; 1 day apart; during/after cath"  . Exertional dyspnea   . S/P appy   . Arrhythmia     Past Surgical History  Procedure Laterality Date  . Appendectomy    . Carpal tunnel release      left  . Mastoidectomy      x3 on right  . Pilonidal cyst / sinus excision    . Knee arthroscopy      left, right knee 10/2009  . Shoulder arthroscopy      right  . Foot fracture surgery      right heel repair  . Nasal septum surgery      nasal septal repair  . Penile prosthesis implant      x 2--got infection after 2nd and had to remove  . Cataract extraction w/ intraocular lens implant  2/13    Dr [name]  . Carpal tunnel release  10/11/11    Dr Rush Barer  . Tonsillectomy and adenoidectomy    . Fracture surgery    . Cardiac catheterization  2012    50% LAD and luminal irreg in RCA, 1/12  Post cath MI from damage  . Penile prosthesis  removal      There were no vitals  filed for this visit.  Visit Diagnosis: Cognitive communication deficit      Subjective Assessment - 03/01/15 1233    Subjective "I cheated - I couldn't follow the news and find the words"   Currently in Pain? No/denies               ADULT SLP TREATMENT - 03/01/15 1235    General Information   Behavior/Cognition Cooperative;Pleasant mood;Distractible;Alert   Treatment Provided   Treatment provided Cognitive-Linquistic   Pain Assessment   Pain Assessment No/denies pain   Cognitive-Linquistic Treatment   Treatment focused on Cognition   Skilled Treatment Pt reported difficulty attending to TV and homework sheet at the same time. He stated he had to turn off the TV to complete word finding homework. Alternating attention reading map, delivery locations and sorting/scheduling deliveries according to map with  rare min A to attend to one way streets and to locate package number on chart - 90% accuracy reading map and assigning deliveries.  Pt divided attention between sorting cards according to suit and stating 4-5 letter words that ST spelled  aloud - 85% with rare min  request for repetition . As divided attention became mildly complex (alphabetizing letters in words while sorting) pt pirmarily used alternating attention .    Assessment / Recommendations / Plan   Plan Continue with current plan of care   Progression Toward Goals   Progression toward goals Progressing toward goals          SLP Education - 03/01/15 1311    Education provided Yes   Education Details compensations for attention    Person(s) Educated Patient   Methods Explanation   Comprehension Verbalized understanding          SLP Short Term Goals - 03/01/15 1317    SLP SHORT TERM GOAL #1   Title pt will demo alternating attention skills in mod complex cognitive-linguistic tasks with 95% success on both tasks over 3 sessions   Time 1   Period Weeks   Status On-going   SLP SHORT TERM GOAL #2   Title pt  will demo emergent awareness demo'd by correcting mod complex cognitive linguistic tasks 100% of the time during task, over two therapy sessions   Time 1   Period Weeks   Status On-going          SLP Long Term Goals - 03/01/15 1317    SLP LONG TERM GOAL #1   Title pt will demo divided attention in functional cognitive-linguistic tasks with 90% success each task over two sessions   Time 5   Period Weeks   Status On-going   SLP LONG TERM GOAL #2   Title pt will demo anticipatory awareness and review cognitive linguistic therapy tasks 100% of the time in 3 therapy sessions   Time 5   Period Weeks   Status On-going          Plan - 03/01/15 1314    Clinical Impression Statement Improvement with alternating attention with rare min A, and occasinal min A for divided attention of simple tasks. Continue skilled ST to maximize attention and awareness for independence.    Speech Therapy Frequency 2x / week   Treatment/Interventions Patient/family education;Compensatory strategies;Internal/external aids;Cognitive reorganization;SLP instruction and feedback;Functional tasks;Cueing hierarchy   Potential to Achieve Goals Good   Consulted and Agree with Plan of Care Patient        Problem List Patient Active Problem List   Diagnosis Date Noted  . 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 CVA (cerebral infarction) 12/31/2014  . Right hand weakness 12/31/2014  . TIA (transient ischemic attack) 12/31/2014  . Advanced directives, counseling/discussion 04/19/2014  . Spinal stenosis, lumbar region, with neurogenic claudication 12/23/2012  . Osteoarthritis, multiple sites 10/08/2012  . Routine general medical examination at a health care facility 02/25/2012  . NSTEMI (non-ST elevated myocardial infarction) 12/28/2011  . Chronic diastolic HF (heart failure) 12/28/2011    Class: Acute  . Frequent falls 12/28/2011  . Allergic rhinitis 11/11/2008  . Hyperlipemia  04/14/2008  . Anemia 04/14/2008  . Episodic mood disorder 04/14/2008  . Essential hypertension 04/14/2008  . Coronary atherosclerosis 04/14/2008  . Atrial fibrillation 04/14/2008  . COPD (chronic obstructive pulmonary disease) with emphysema 04/14/2008    Lovvorn, Annye Rusk MS, CCC-SLP 03/01/2015, 1:18 PM  [facility] [address] [place], [place], [address] Phone: [phone]   Fax:  [phone]

## 2015-03-02 NOTE — Telephone Encounter (Signed)
Rn call patient and he stated Dr. Erlinda Hong call him yesterday about the MRI. He stated he got all of his questions answer and understood.

## 2015-03-02 NOTE — Telephone Encounter (Signed)
Not really. Please kindly tell the patient that I have seen his MRI of the brain and the diagnoses are clear to me so far and I do not need to see the lower back MRI at this moment. Please let him keep the MRI of lower back and I will let him know if I need to see it. Thank you  Rosalin Hawking, MD PhD Stroke Neurology 03/02/2015 11:06 AM

## 2015-03-03 ENCOUNTER — Encounter: Payer: PPO | Admitting: Occupational Therapy

## 2015-03-03 ENCOUNTER — Ambulatory Visit: Payer: PPO

## 2015-03-03 ENCOUNTER — Encounter: Payer: PPO | Admitting: Speech Pathology

## 2015-03-07 ENCOUNTER — Encounter: Payer: Self-pay | Admitting: Occupational Therapy

## 2015-03-07 ENCOUNTER — Ambulatory Visit: Payer: PPO | Admitting: Occupational Therapy

## 2015-03-07 ENCOUNTER — Ambulatory Visit: Payer: PPO | Admitting: Physical Therapy

## 2015-03-07 ENCOUNTER — Ambulatory Visit: Payer: PPO | Admitting: Speech Pathology

## 2015-03-07 DIAGNOSIS — R2681 Unsteadiness on feet: Secondary | ICD-10-CM

## 2015-03-07 DIAGNOSIS — R269 Unspecified abnormalities of gait and mobility: Secondary | ICD-10-CM

## 2015-03-07 DIAGNOSIS — M6281 Muscle weakness (generalized): Secondary | ICD-10-CM

## 2015-03-07 DIAGNOSIS — G8191 Hemiplegia, unspecified affecting right dominant side: Secondary | ICD-10-CM

## 2015-03-07 DIAGNOSIS — IMO0002 Reserved for concepts with insufficient information to code with codable children: Secondary | ICD-10-CM

## 2015-03-07 DIAGNOSIS — R41841 Cognitive communication deficit: Secondary | ICD-10-CM

## 2015-03-07 NOTE — Therapy (Signed)
[facility] [address] [place], [place], [address] Phone: [phone]   Fax:  [phone]  Physical Therapy Treatment  Patient Details  Name: Eduardo [name] MRN: [id number] Date of Birth: Nov 10, 1930 Referring Provider:  Venia Carbon, MD  Encounter Date: 03/07/2015      PT End of Session - 03/07/15 1831    Visit Number 13   Number of Visits 17   Date for PT Re-Evaluation 03/15/15   Authorization Type G-Code every 10th visit.   PT Start Time 1319   PT Stop Time 1400   PT Time Calculation (min) 41 min   Activity Tolerance Patient tolerated treatment well   Behavior During Therapy WFL for tasks assessed/performed      Past Medical History  Diagnosis Date  . Anemia   . Anxiety   . Atrial fibrillation   . CAD (coronary artery disease)   . Hyperlipidemia   . COPD (chronic obstructive pulmonary disease)   . Allergy   . Osteoarthritis   . Hx of colonic polyps   . Hyperplastic polyp of intestine 2001  . Adenomatous polyp 2001  . Myocardial infarction 06/2010    "twice; 1 day apart; during/after cath"  . Exertional dyspnea   . S/P appy   . Arrhythmia   . Stroke     Past Surgical History  Procedure Laterality Date  . Appendectomy    . Carpal tunnel release      left  . Mastoidectomy      x3 on right  . Pilonidal cyst / sinus excision    . Knee arthroscopy      left, right knee 10/2009  . Shoulder arthroscopy      right  . Foot fracture surgery      right heel repair  . Nasal septum surgery      nasal septal repair  . Penile prosthesis implant      x 2--got infection after 2nd and had to remove  . Cataract extraction w/ intraocular lens implant  2/13    Dr [name]  . Carpal tunnel release  10/11/11    Dr Rush Barer  . Tonsillectomy and adenoidectomy    . Fracture surgery    . Cardiac catheterization  2012    50% LAD and luminal irreg in RCA, 1/12  Post cath MI from damage  .  Penile prosthesis  removal      There were no vitals filed for this visit.  Visit Diagnosis:  Hemiplegia affecting right dominant side  Abnormality of gait  Unsteadiness      Subjective Assessment - 03/07/15 1828    Subjective Pt reports significant improvement in back/buttock pain, symptoms since having gotten recent cortisone injections. Denies falls. Agreeable to discharge from PT today.   Pertinent History Chronic atrial fibrillation, combined systolic and diastolic heart failure, CAD, HTN, spinal stenosis. * Notify cardiologist if HR < 50 bpm   Limitations House hold activities;Walking   Patient Stated Goals "Improve my endurance, get better balance, walk like I used to, and get back to my regimen."   Currently in Pain? No/denies                         [facility] Adult PT Treatment/Exercise - 03/07/15 0001    Ambulation/Gait   Ambulation/Gait Yes   Ambulation/Gait Assistance 6: Modified independent (Device/Increase time)   Ambulation/Gait Assistance Details x500' outdoors, x200' indoors   Ambulation Distance (Feet) 700 Feet  Assistive device Straight cane   Gait Pattern Step-through pattern;Decreased step length - left;Decreased stance time - right;Right flexed knee in stance;Trendelenburg;Decreased trunk rotation  R Trendelenberg with increased fatigue    Ambulation Surface Level;Unlevel;Indoor;Outdoor;Paved   Stairs Yes   Stairs Assistance 6: Modified independent (Device/Increase time)   Stair Management Technique Two rails;Alternating pattern;Forwards   Number of Stairs 28   Height of Stairs 6   Door Management 6: Modified independent (Device/Increase time)  with SPC   Ramp 6: Modified independent (Device)  with SPC   Curb 6: Modified independent (Device/increase time)   Curb Details (indicate cue type and reason) with [facility]   Neuro Re-ed    Neuro Re-ed Details  Reviewed wall bumps and standing on compliant surface with EO then EC x30 seconds each with  pt requiring supervision and subtle cueing for technique. Instructed pt in standing in corner (chair in front for safety) on solid surface with wife BOA, EC performing horizontal and vertical head turns x10 reps each. Pt with effective return demonstration. See Pt Instructions for further details.                PT Education - 03/07/15 1830    Education provided Yes   Education Details Pt goals, progress, and discharge plan. Addition made to HEP; see Pt Instrucitons for details.   Person(s) Educated Patient   Methods Explanation;Demonstration;Verbal cues;Tactile cues;Handout   Comprehension Verbalized understanding;Returned demonstration          PT Short Term Goals - 03/07/15 1836    PT SHORT TERM GOAL #1   Title Pt will perform home exercises with mod I using paper handout to indicate safe HEP compliance and to maximize functional gains. Target date: 02/11/15.   Baseline Met 03/07/15.   Status Achieved   PT SHORT TERM GOAL #2   Title Pt/wife will report consistent participation in home walking program (3-5 days/week at minimum) to facilitate improved endurance, activity tolerance. Target date: 02/11/15.   Baseline Pt reports that increased walking causes increased pain. Pt reports he will begin to attempt again.    Status Not Met   PT SHORT TERM GOAL #3   Title Pt will increase 6 Minute Walk distance from 693' to 750' to indicate progress towrd significant improvement in functional endurance. Target date: 02/11/15.   Baseline 948' on 8/30   Status Achieved   PT SHORT TERM GOAL #4   Title Pt will negotiate 12 stairs with 2 rails and mod I to progress toward pt ability to safely access second floor of home without wife providing supervision/assistance. Target date: 02/11/15.   Baseline met 8/30   Status Achieved   PT SHORT TERM GOAL #5   Title Perform Berg Balance Scale to assess/address functional standing balance and fall risk. Target date: 02/11/15.   Baseline Scored 39/56 on 8/9.    Status Achieved           PT Long Term Goals - 03/07/15 1836    PT LONG TERM GOAL #1   Title Pt will increase 6 Minute Walk Test distance from 693' to 806' to indicate significant improvement in functional endurance. Target date: 03/11/15   Baseline 948' on 8/30    Status Achieved   PT LONG TERM GOAL #2   Title Pt/wife will verbalize understanding of safe fitness regimen for gym progress toward pt-stated goal of being able to safely exercise at gym. Target date: 03/11/15   Baseline 9/26: pt verbalizes plan to perform exercise under supervision  of licensed PT (as he has in past).   Status Achieved   PT LONG TERM GOAL #3   Title Pt will report no more than 2-point increase in within-session pain rating to indicate ability to perform functional mobility without limitation of pain. Target date: 03/11/15   Baseline Met 2023/03/11.   Status Achieved   PT LONG TERM GOAL #4   Title Pt will increase Berg Balance score by 8 points from baseline to indicate significant improvement in functional standing balance. Target date: 03/11/15   Baseline 8/9: baseline Berg score = 39/56;   9/20: Berg score = 48/56   Status Achieved   PT LONG TERM GOAL #5   Title Pt/wife will verbalize understanding of CVA warning signs to indicate understanding of situations in which to seek medical attention. Target date: 03/11/15   Baseline Met 03/01/15.   Status Achieved   PT LONG TERM GOAL #6   Title Pt will ambulate > 200' over level surfaces with mod I to indicate increased stability/independence with household mobility. Target date: 03/11/15   Baseline Met 03/01/15   Status Achieved   PT LONG TERM GOAL #7   Title Pt will ambulate > 300' over unlevel, paved surfaces with mod I using LRAD to indicate increased safety/independence with community mobility, return to PLOF. Target date: 03/11/15   Baseline Met 03/01/15   Status Achieved   PT LONG TERM GOAL #8   Title Pt will negotiate standard ramp and curb with mod I using LRAD  to indicate increased safety/independence negotiating community obstacles, return to PLOF. Target date: 03/11/15   Baseline Met 2023-03-11.   Status Achieved   PT LONG TERM GOAL  #9   TITLE Pt will negotiate 26 stairs with 2 rails requiring single standing rest break (< 1 minute duration) with mod I, increased time to enable pt to safely access second floor of home. Target date: 03/11/15   Baseline Met 03-11-23.   Status Achieved               Plan - 2015-03-11 1840    Clinical Impression Statement Pt has met 9 of 9 LTG's and therefore will be discharged from outpatient PT at this time. Pt verbalized understanding and was in full agreement wth D/C plan.   Pt will benefit from skilled therapeutic intervention in order to improve on the following deficits Abnormal gait;Decreased endurance;Decreased activity tolerance;Decreased balance;Decreased cognition;Decreased mobility;Decreased strength;Decreased safety awareness;Decreased knowledge of use of DME   Rehab Potential Good   PT Frequency 2x / week   PT Duration 8 weeks   PT Treatment/Interventions ADLs/Self Care Home Management;Functional mobility training;Stair training;Gait training;DME Instruction;Therapeutic activities;Therapeutic exercise;Balance training;Neuromuscular re-education;Patient/family education;Orthotic Fit/Training;Vestibular;Manual techniques   PT Next Visit Plan N/A, as patient will be discharged from outpatient PT at this time.   Consulted and Agree with Plan of Care Patient          G-Codes - 03/11/2015 1839    Functional Assessment Tool Used 6 minute walk test (MWT): 948' with no rest breaks. Vital signs WNL.   Functional Limitation Mobility: Walking and moving around   Mobility: Walking and Moving Around Current Status [phone]) At least 20 percent but less than 40 percent impaired, limited or restricted   Mobility: Walking and Moving Around Goal Status [phone]) At least 20 percent but less than 40 percent impaired, limited or  restricted   Mobility: Walking and Moving Around Discharge Status [phone]) At least 20 percent but less than 40 percent impaired, limited or restricted  Problem List Patient Active Problem List   Diagnosis Date Noted  . 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 Hyperlipidemia 03/01/2015  . Chronic anticoagulation 03/01/2015  . 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 CVA (cerebral infarction) 12/31/2014  . Right hand weakness 12/31/2014  . TIA (transient ischemic attack) 12/31/2014  . Advanced directives, counseling/discussion 04/19/2014  . Spinal stenosis, lumbar region, with neurogenic claudication 12/23/2012  . Osteoarthritis, multiple sites 10/08/2012  . Routine general medical examination at a health care facility 02/25/2012  . NSTEMI (non-ST elevated myocardial infarction) 12/28/2011  . Chronic diastolic HF (heart failure) 12/28/2011    Class: Acute  . Frequent falls 12/28/2011  . Allergic rhinitis 11/11/2008  . Hyperlipemia 04/14/2008  . Anemia 04/14/2008  . Episodic mood disorder 04/14/2008  . Essential hypertension 04/14/2008  . Coronary atherosclerosis 04/14/2008  . Atrial fibrillation 04/14/2008  . COPD (chronic obstructive pulmonary disease) with emphysema 04/14/2008    PHYSICAL THERAPY DISCHARGE SUMMARY  Visits from Start of Care: 13  Current functional level related to goals / functional outcomes: See goals and statuses above   Remaining deficits: Pt continues to utilize [facility] for all functional mobility; however, pt was using SPC prior to CVA.    Education / Equipment: Provided HEP, education on CVA risk factors and warning signs.  Plan: Patient agrees to discharge.  Patient goals were met. Patient is being discharged due to meeting the stated rehab goals.  ?????       Billie Ruddy, PT, [facility] [address] [place], [place], [address] Phone:  [phone]   Fax:  [phone] 03/07/2015, 6:42 PM

## 2015-03-07 NOTE — Therapy (Signed)
[facility] [address] [place], [place], [address] Phone: [phone]   Fax:  [phone]  Occupational Therapy Treatment  Patient Details  Name: Eduardo [name] MRN: [id number] Date of Birth: December 23, 1930 Referring Provider:  Venia Carbon, MD  Encounter Date: 03/07/2015      OT End of Session - 03/07/15 1433    Visit Number 13   Number of Visits 16   Date for OT Re-Evaluation 03/11/15   Authorization Type [employer] - assume medicare managed plan and will need G code!!! copay with unlimited visits   Authorization Time Period 01/14/2015-03/11/2015   Authorization - Visit Number 13   Authorization - Number of Visits 16   OT Start Time 1400   OT Stop Time 1443   OT Time Calculation (min) 43 min   Activity Tolerance Patient tolerated treatment well      Past Medical History  Diagnosis Date  . Anemia   . Anxiety   . Atrial fibrillation   . CAD (coronary artery disease)   . Hyperlipidemia   . COPD (chronic obstructive pulmonary disease)   . Allergy   . Osteoarthritis   . Hx of colonic polyps   . Hyperplastic polyp of intestine 2001  . Adenomatous polyp 2001  . Myocardial infarction 06/2010    "twice; 1 day apart; during/after cath"  . Exertional dyspnea   . S/P appy   . Arrhythmia   . Stroke     Past Surgical History  Procedure Laterality Date  . Appendectomy    . Carpal tunnel release      left  . Mastoidectomy      x3 on right  . Pilonidal cyst / sinus excision    . Knee arthroscopy      left, right knee 10/2009  . Shoulder arthroscopy      right  . Foot fracture surgery      right heel repair  . Nasal septum surgery      nasal septal repair  . Penile prosthesis implant      x 2--got infection after 2nd and had to remove  . Cataract extraction w/ intraocular lens implant  2/13    Dr [name]  . Carpal tunnel release  10/11/11    Dr Rush Barer  . Tonsillectomy and  adenoidectomy    . Fracture surgery    . Cardiac catheterization  2012    50% LAD and luminal irreg in RCA, 1/12  Post cath MI from damage  . Penile prosthesis  removal      There were no vitals filed for this visit.  Visit Diagnosis:  Hemiplegia affecting right dominant side  Lack of coordination due to stroke  Muscle weakness      Subjective Assessment - 03/07/15 1404    Subjective  I got my injection and I have almost no pain in my back anymore   Pertinent History see epic snapshot   Patient Stated Goals get my hand working better   Currently in Pain? No/denies                      OT Treatments/Exercises (OP) - 03/07/15 0001    ADLs   Writing Pt able to complete three sentences with cursive and three sentences with printing with 100% legibility. Pt''s printing easier to read however pt prefers to complete cursive. Pt able to verbalize strategies of big letters and slow rate.    Fine Motor Coordination  Other Fine Motor Exercises Therapeutic actvities with emphasis on in hand manipulation, timing, force and speed. Pt with min dificulty.                   OT Short Term Goals - 03/07/15 1431    OT SHORT TERM GOAL #1   Title Pt will be mod I with HEP - 02/11/2115   Status Achieved   OT SHORT TERM GOAL #2   Title Pt will demonstrate improved grip strenth in R hand by at least 5 pounds (baseline=45 pounds) to assist with functional tasks   Status Achieved  60 lbs   OT SHORT TERM GOAL #3   Title Pt will demonstrate improved pinch strength R hand by at least 2 pounds combined (baseline = 31) to assist with functional tasks   Status Achieved  02/21/2015 combined = 38 pounds   OT SHORT TERM GOAL #4   Title Pt will be mod I with cutting meat, AE prn   Status Achieved   OT SHORT TERM GOAL #5   Title Pt will demonstrate mod I with selective attention for basic familiar functional tasks   Status Achieved   OT SHORT TERM GOAL #6   Title Pt will complete  at least 10 minutes of functional activity without rest breaks   Status Achieved  per pt report   OT SHORT TERM GOAL #7   Title Pt will demonstrate ability to write at 2 sentence level with 80% legibility with AE prn   Status Achieved           OT Long Term Goals - 03/07/15 1431    OT LONG TERM GOAL #1   Title Pt will be mod I with upgraded HEP- 03/11/2015   Status Achieved   OT LONG TERM GOAL #2   Title Pt will demonstrate mod I with alternating attention with basic familiar functional tasks   Status Achieved   OT LONG TERM GOAL #3   Title Pt will demonstrate improved grip strength in R hand by at least 8 pounds (baseline= 45) to assist with functional tasks   Status Achieved  60 pounds.Pt feels strength is at basline   OT LONG TERM GOAL #4   Title Pt will be mod I with simple home mgmt tasks   Status Achieved  pt is cooking breakfast, doing dishes, sweeping floor, etc   OT LONG TERM GOAL #5   Title Pt will be able to complete 20 minutes of activity without rest break   Status Achieved  pt able to complete shower, dressing and hot meal prep before sitting down   OT LONG TERM GOAL #6   Title Pt will demonstrate improved coordination as evidenced by decreasing time on 9 hole peg test by at least 10 seconds (baseline = 53.43) to assist with functional tasks   Status On-going  44 on 02/21/2015   OT LONG TERM GOAL #7   Title Pt will demonstrate ability to write at 3 sentence level with 100% legibility with  AE prn   Status Achieved               Plan - 03/07/15 1431    Clinical Impression Statement Pt continues to make progress and has met all short term goals and all but one long term goal. Pt states he is very happy with his writing.   Pt will benefit from skilled therapeutic intervention in order to improve on the following deficits (Retired) Decreased activity tolerance;Decreased balance;Decreased cognition;Decreased  mobility;Decreased endurance;Decreased  coordination;Decreased safety awareness;Decreased strength;Impaired UE functional use   Rehab Potential Good   OT Frequency 2x / week   OT Duration 8 weeks   OT Treatment/Interventions Self-care/ADL training;Fluidtherapy;Therapeutic exercise;Neuromuscular education;Manual Therapy;Functional Mobility Training;DME and/or AE instruction;Cognitive remediation/compensation;Therapeutic activities;Patient/family education;Balance training   Plan fine motor coordination with emphasis on speed with accuracy and in hand manipulation   OT Home Exercise Plan added coordination exercise   Consulted and Agree with Plan of Care Patient        Problem List Patient Active Problem List   Diagnosis Date Noted  . 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 Hyperlipidemia 03/01/2015  . Chronic anticoagulation 03/01/2015  . 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 CVA (cerebral infarction) 12/31/2014  . Right hand weakness 12/31/2014  . TIA (transient ischemic attack) 12/31/2014  . Advanced directives, counseling/discussion 04/19/2014  . Spinal stenosis, lumbar region, with neurogenic claudication 12/23/2012  . Osteoarthritis, multiple sites 10/08/2012  . Routine general medical examination at a health care facility 02/25/2012  . NSTEMI (non-ST elevated myocardial infarction) 12/28/2011  . Chronic diastolic HF (heart failure) 12/28/2011    Class: Acute  . Frequent falls 12/28/2011  . Allergic rhinitis 11/11/2008  . Hyperlipemia 04/14/2008  . Anemia 04/14/2008  . Episodic mood disorder 04/14/2008  . Essential hypertension 04/14/2008  . Coronary atherosclerosis 04/14/2008  . Atrial fibrillation 04/14/2008  . COPD (chronic obstructive pulmonary disease) with emphysema 04/14/2008    Quay Burow, OTR/L 03/07/2015, 2:47 PM  [facility] [address] [place], [place],  [address] Phone: [phone]   Fax:  [phone]

## 2015-03-07 NOTE — Therapy (Signed)
[facility] [address] [place], [place], [address] Phone: [phone]   Fax:  [phone]  Speech Language Pathology Treatment  Patient Details  Name: Eduardo [name] MRN: [id number] Date of Birth: [birth year] Referring Provider:  Venia Carbon, MD  Encounter Date: 03/07/2015      End of Session - 03/07/15 1315    SLP Start Time 6546   SLP Stop Time  5035   SLP Time Calculation (min) 40 min      Past Medical History  Diagnosis Date  . Anemia   . Anxiety   . Atrial fibrillation   . CAD (coronary artery disease)   . Hyperlipidemia   . COPD (chronic obstructive pulmonary disease)   . Allergy   . Osteoarthritis   . Hx of colonic polyps   . Hyperplastic polyp of intestine 2001  . Adenomatous polyp 2001  . Myocardial infarction 06/2010    "twice; 1 day apart; during/after cath"  . Exertional dyspnea   . S/P appy   . Arrhythmia   . Stroke     Past Surgical History  Procedure Laterality Date  . Appendectomy    . Carpal tunnel release      left  . Mastoidectomy      x3 on right  . Pilonidal cyst / sinus excision    . Knee arthroscopy      left, right knee 10/2009  . Shoulder arthroscopy      right  . Foot fracture surgery      right heel repair  . Nasal septum surgery      nasal septal repair  . Penile prosthesis implant      x 2--got infection after 2nd and had to remove  . Cataract extraction w/ intraocular lens implant  2/13    Dr [name]  . Carpal tunnel release  10/11/11    Dr Rush Barer  . Tonsillectomy and adenoidectomy    . Fracture surgery    . Cardiac catheterization  2012    50% LAD and luminal irreg in RCA, 1/12  Post cath MI from damage  . Penile prosthesis  removal      There were no vitals filed for this visit.  Visit Diagnosis: Cognitive communication deficit      Subjective Assessment - 03/07/15 1238    Subjective "I had my spine shot last week - it  seems to be working"               ADULT SLP TREATMENT - 03/07/15 1239    General Information   Behavior/Cognition Cooperative;Pleasant mood;Distractible;Alert   Treatment Provided   Treatment provided Cognitive-Linquistic   Pain Assessment   Pain Assessment No/denies pain   Cognitive-Linquistic Treatment   Treatment focused on Cognition   Skilled Treatment Alternating attention facilitated between unscrambling moderately complex sentences and sorting/stragegizing to use the most cards with 95% on each and rare min verbal cues. Divided attention between  making 4 letter words from longer words and naming sequential categories with  occasional min cues ,80% on writing words and 90% on sequntial categorization - primarily alternated attention.    Assessment / Recommendations / Plan   Plan Continue with current plan of care   Progression Toward Goals   Progression toward goals Progressing toward goals            SLP Short Term Goals - 03/07/15 1310    SLP SHORT TERM GOAL #1   Title pt will demo alternating attention  skills in mod complex cognitive-linguistic tasks with 95% success on both tasks over 3 sessions   Time 1   Period Weeks   Status Achieved   SLP SHORT TERM GOAL #2   Title pt will demo emergent awareness demo'd by correcting mod complex cognitive linguistic tasks 100% of the time during task, over two therapy sessions   Time 1   Period Weeks   Status Not Met          SLP Long Term Goals - 03/07/15 1311    SLP LONG TERM GOAL #1   Title pt will demo divided attention in functional cognitive-linguistic tasks with 90% success each task over two sessions   Time 4   Period Weeks   Status On-going   SLP LONG TERM GOAL #2   Title pt will demo anticipatory awareness and review cognitive linguistic therapy tasks 100% of the time in 3 therapy sessions   Time 4   Period Weeks   Status On-going          Plan - 03/07/15 1312    Clinical Impression Statement  Pt continues to required min to mod A for simple divided attention tasks and error awareness. Continue skilled ST to maximize independence for high level cogntive activities   Speech Therapy Frequency 2x / week   Treatment/Interventions Patient/family education;Compensatory strategies;Internal/external aids;Cognitive reorganization;SLP instruction and feedback;Functional tasks;Cueing hierarchy   Potential to Achieve Goals Good   Consulted and Agree with Plan of Care Patient        Problem List Patient Active Problem List   Diagnosis Date Noted  . 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 Hyperlipidemia 03/01/2015  . Chronic anticoagulation 03/01/2015  . 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 CVA (cerebral infarction) 12/31/2014  . Right hand weakness 12/31/2014  . TIA (transient ischemic attack) 12/31/2014  . Advanced directives, counseling/discussion 04/19/2014  . Spinal stenosis, lumbar region, with neurogenic claudication 12/23/2012  . Osteoarthritis, multiple sites 10/08/2012  . Routine general medical examination at a health care facility 02/25/2012  . NSTEMI (non-ST elevated myocardial infarction) 12/28/2011  . Chronic diastolic HF (heart failure) 12/28/2011    Class: Acute  . Frequent falls 12/28/2011  . Allergic rhinitis 11/11/2008  . Hyperlipemia 04/14/2008  . Anemia 04/14/2008  . Episodic mood disorder 04/14/2008  . Essential hypertension 04/14/2008  . Coronary atherosclerosis 04/14/2008  . Atrial fibrillation 04/14/2008  . COPD (chronic obstructive pulmonary disease) with emphysema 04/14/2008    [name], Annye Rusk MS, CCC-SLP 03/07/2015, 1:15 PM  [facility] [address] [place], [place], [address] Phone: [phone]   Fax:  [phone]

## 2015-03-07 NOTE — Patient Instructions (Signed)
Feet Apart (Compliant Surface) Varied Arm Positions - Eyes Open   With eyes open, standing on compliant surface with eyes closed. Arms by your side_, feet shoulder width apart and arms out, look at a stationary object. Hold __15-20__ seconds. Repeat _2___ times per session. Do __2__ sessions per day. Have chair in front of you for support if needed and do in corner of room in case of loss of balance.   Feet Apart, Head Motion - Eyes Closed   With eyes closed and feet shoulder width apart, move head slowly, up and down. Repeat _10___ times per session. Then perform with head moving up/down 10 times. Do _1-2___ sessions per day.    Weight Shift: Anterior / Posterior (Righting / Equilibrium)    BEGIN WITH BACK LEANING AGAINST THE WALL AND FEET 6 INCHES AWAY. Move your hips off the wall and come to upright standing. Hold for 5 seconds. Return slowly to the wall letting your hips bump the wall and return to stand.  Hold each position _10___ seconds. Repeat __10_ times per session. Do _2__ sessions per day.

## 2015-03-09 ENCOUNTER — Ambulatory Visit: Payer: PPO

## 2015-03-09 ENCOUNTER — Encounter: Payer: Self-pay | Admitting: Occupational Therapy

## 2015-03-09 ENCOUNTER — Ambulatory Visit: Payer: PPO | Admitting: Occupational Therapy

## 2015-03-09 ENCOUNTER — Ambulatory Visit: Payer: PPO | Admitting: Physical Therapy

## 2015-03-09 DIAGNOSIS — R41841 Cognitive communication deficit: Secondary | ICD-10-CM

## 2015-03-09 DIAGNOSIS — IMO0002 Reserved for concepts with insufficient information to code with codable children: Secondary | ICD-10-CM

## 2015-03-09 DIAGNOSIS — M6281 Muscle weakness (generalized): Secondary | ICD-10-CM | POA: Diagnosis not present

## 2015-03-09 NOTE — Therapy (Signed)
[facility] [address] [place], [place], [address] Phone: [phone]   Fax:  [phone]  Occupational Therapy Treatment  Patient Details  Name: Eduardo [name] MRN: [id number] Date of Birth: 1931-03-23 Referring Provider:  Venia Carbon, MD  Encounter Date: 03/09/2015      OT End of Session - 03/09/15 1353    Visit Number 14   Number of Visits 16   Date for OT Re-Evaluation 03/11/15   Authorization Type [employer] - assume medicare managed plan and will need G code!!! copay with unlimited visits   Authorization Time Period 01/14/2015-03/11/2015   Authorization - Visit Number 14   Authorization - Number of Visits 16   OT Start Time 2549   OT Stop Time 1355   OT Time Calculation (min) 40 min   Activity Tolerance Patient tolerated treatment well   Behavior During Therapy [facility] for tasks assessed/performed      Past Medical History  Diagnosis Date  . Anemia   . Anxiety   . Atrial fibrillation   . CAD (coronary artery disease)   . Hyperlipidemia   . COPD (chronic obstructive pulmonary disease)   . Allergy   . Osteoarthritis   . Hx of colonic polyps   . Hyperplastic polyp of intestine 2001  . Adenomatous polyp 2001  . Myocardial infarction 06/2010    "twice; 1 day apart; during/after cath"  . Exertional dyspnea   . S/P appy   . Arrhythmia   . Stroke     Past Surgical History  Procedure Laterality Date  . Appendectomy    . Carpal tunnel release      left  . Mastoidectomy      x3 on right  . Pilonidal cyst / sinus excision    . Knee arthroscopy      left, right knee 10/2009  . Shoulder arthroscopy      right  . Foot fracture surgery      right heel repair  . Nasal septum surgery      nasal septal repair  . Penile prosthesis implant      x 2--got infection after 2nd and had to remove  . Cataract extraction w/ intraocular lens implant  2/13    Dr [name]  . Carpal tunnel  release  10/11/11    Dr Rush Barer  . Tonsillectomy and adenoidectomy    . Fracture surgery    . Cardiac catheterization  2012    50% LAD and luminal irreg in RCA, 1/12  Post cath MI from damage  . Penile prosthesis  removal      There were no vitals filed for this visit.  Visit Diagnosis:  Lack of coordination due to stroke      Subjective Assessment - 03/09/15 1331    Subjective  I am doing well on my own, my wife went to visit her frined.   Pertinent History see epic snapshot   Patient Stated Goals get my hand working better   Currently in Pain? No/denies   Pain Score 0-No pain            [facility] OT Assessment - 03/09/15 0001    Coordination   Right 9 Hole Peg Test 31.84 seconds                  OT Treatments/Exercises (OP) - 03/09/15 0001    ADLs   Writing Reviewed with patient the legibility with both printing and writing tasks.  Patient very  pleased with progress with handwriting and printing at this time.     Fine Motor Coordination   Other Fine Motor Exercises [place] tweezer dexterity pegboard to address remaining goal of speed and accuarcy of fine motor coordination tasks.  Patient able to effectively utilize tweezers for very fine pegs effectively and manipulate pegs with tweezers without difficulty.  ABle to manipulate small pegs in hand without difficulty as well.                  OT Education - 03/09/15 1353    Education provided Yes   Education Details Reviewed long term goals   Person(s) Educated Patient   Methods Explanation;Demonstration   Comprehension Verbalized understanding          OT Short Term Goals - 03/07/15 1431    OT SHORT TERM GOAL #1   Title Pt will be mod I with HEP - 02/11/2115   Status Achieved   OT SHORT TERM GOAL #2   Title Pt will demonstrate improved grip strenth in R hand by at least 5 pounds (baseline=45 pounds) to assist with functional tasks   Status Achieved  60 lbs   OT SHORT TERM GOAL #3    Title Pt will demonstrate improved pinch strength R hand by at least 2 pounds combined (baseline = 31) to assist with functional tasks   Status Achieved  02/21/2015 combined = 38 pounds   OT SHORT TERM GOAL #4   Title Pt will be mod I with cutting meat, AE prn   Status Achieved   OT SHORT TERM GOAL #5   Title Pt will demonstrate mod I with selective attention for basic familiar functional tasks   Status Achieved   OT SHORT TERM GOAL #6   Title Pt will complete at least 10 minutes of functional activity without rest breaks   Status Achieved  per pt report   OT SHORT TERM GOAL #7   Title Pt will demonstrate ability to write at 2 sentence level with 80% legibility with AE prn   Status Achieved           OT Long Term Goals - 03/09/15 1358    OT LONG TERM GOAL #1   Title Pt will be mod I with upgraded HEP- 03/11/2015   Status Achieved   OT LONG TERM GOAL #2   Title Pt will demonstrate mod I with alternating attention with basic familiar functional tasks   Status Achieved   OT LONG TERM GOAL #3   Title Pt will demonstrate improved grip strength in R hand by at least 8 pounds (baseline= 45) to assist with functional tasks   Status Achieved   OT LONG TERM GOAL #4   Title Pt will be mod I with simple home mgmt tasks   Status Achieved   OT LONG TERM GOAL #5   Title Pt will be able to complete 20 minutes of activity without rest break   Status Achieved   OT LONG TERM GOAL #6   Title Pt will demonstrate improved coordination as evidenced by decreasing time on 9 hole peg test by at least 10 seconds (baseline = 53.43) to assist with functional tasks   Status Achieved   OT LONG TERM GOAL #7   Title Pt will demonstrate ability to write at 3 sentence level with 100% legibility with  AE prn   Status Achieved               Plan - 03/09/15 1354  Clinical Impression Statement Patient has met all long term goals and is ready for discharge.     Pt will benefit from skilled  therapeutic intervention in order to improve on the following deficits (Retired) Decreased activity tolerance;Decreased balance;Decreased cognition;Decreased mobility;Decreased endurance;Decreased coordination;Decreased safety awareness;Decreased strength;Impaired UE functional use   Rehab Potential Good   OT Frequency 2x / week   OT Duration 8 weeks   OT Treatment/Interventions Self-care/ADL training;Fluidtherapy;Therapeutic exercise;Neuromuscular education;Manual Therapy;Functional Mobility Training;DME and/or AE instruction;Cognitive remediation/compensation;Therapeutic activities;Patient/family education;Balance training   Plan discharge from [facility] added coordination exercise   Consulted and Agree with Plan of Care Patient          G-Codes - 31-Mar-2015 1549    Functional Assessment Tool Used 9 hole peg, grip strength, clinical observation   Functional Limitation Carrying, moving and handling objects   Carrying, Moving and Handling Objects Current Status (W2993) At least 20 percent but less than 40 percent impaired, limited or restricted   Carrying, Moving and Handling Objects Goal Status (Z1696) At least 20 percent but less than 40 percent impaired, limited or restricted   Carrying, Moving and Handling Objects Discharge Status [phone]) At least 20 percent but less than 40 percent impaired, limited or restricted      Problem List Patient Active Problem List   Diagnosis Date Noted  . 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 Hyperlipidemia 03/01/2015  . Chronic anticoagulation 03/01/2015  . 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 CVA (cerebral infarction) 12/31/2014  . Right hand weakness 12/31/2014  . TIA (transient ischemic attack) 12/31/2014  . Advanced directives, counseling/discussion 04/19/2014  . Spinal stenosis, lumbar region, with neurogenic claudication 12/23/2012  . Osteoarthritis, multiple sites  10/08/2012  . Routine general medical examination at a health care facility 02/25/2012  . NSTEMI (non-ST elevated myocardial infarction) 12/28/2011  . Chronic diastolic HF (heart failure) 12/28/2011    Class: Acute  . Frequent falls 12/28/2011  . Allergic rhinitis 11/11/2008  . Hyperlipemia 04/14/2008  . Anemia 04/14/2008  . Episodic mood disorder 04/14/2008  . Essential hypertension 04/14/2008  . Coronary atherosclerosis 04/14/2008  . Atrial fibrillation 04/14/2008  . COPD (chronic obstructive pulmonary disease) with emphysema 04/14/2008   OCCUPATIONAL THERAPY DISCHARGE SUMMARY  Visits from Start of Care: 14  Current functional level related to goals / functional outcomes: Modified independence with ADL, legible cursive and printing, improved coordination and strength in left hand.   Remaining deficits: Thumb instability left (longstanding), tremor left hand   Education / Equipment: Patient independent in home exercise program.  Patient instructed on potential splint to purchase to help with thumb stability in left hand.   Plan: Patient agrees to discharge.  Patient goals were partially met. Patient is being discharged due to meeting the stated rehab goals.  ?????      Mariah Milling, OTR/L 03/31/15, 3:50 PM  [facility] [address] [place], [place], [address] Phone: [phone]   Fax:  [phone]

## 2015-03-10 NOTE — Therapy (Signed)
[facility] [address] [place], [place], [address] Phone: [phone]   Fax:  [phone]  Speech Language Pathology Treatment  Patient Details  Name: Eduardo [name] MRN: [id number] Date of Birth: 05-22-1931 Referring Provider:  Venia Carbon, MD  Encounter Date: 03/09/2015      End of Session - 03/09/15 1100    Visit Number 9   Number of Visits 16   Date for SLP Re-Evaluation 04/05/15   SLP Start Time 3295   SLP Stop Time  1445   SLP Time Calculation (min) 42 min   Activity Tolerance Patient tolerated treatment well      Past Medical History  Diagnosis Date  . Anemia   . Anxiety   . Atrial fibrillation   . CAD (coronary artery disease)   . Hyperlipidemia   . COPD (chronic obstructive pulmonary disease)   . Allergy   . Osteoarthritis   . Hx of colonic polyps   . Hyperplastic polyp of intestine 2001  . Adenomatous polyp 2001  . Myocardial infarction 06/2010    "twice; 1 day apart; during/after cath"  . Exertional dyspnea   . S/P appy   . Arrhythmia   . Stroke     Past Surgical History  Procedure Laterality Date  . Appendectomy    . Carpal tunnel release      left  . Mastoidectomy      x3 on right  . Pilonidal cyst / sinus excision    . Knee arthroscopy      left, right knee 10/2009  . Shoulder arthroscopy      right  . Foot fracture surgery      right heel repair  . Nasal septum surgery      nasal septal repair  . Penile prosthesis implant      x 2--got infection after 2nd and had to remove  . Cataract extraction w/ intraocular lens implant  2/13    Dr [name]  . Carpal tunnel release  10/11/11    Dr Rush Barer  . Tonsillectomy and adenoidectomy    . Fracture surgery    . Cardiac catheterization  2012    50% LAD and luminal irreg in RCA, 1/12  Post cath MI from damage  . Penile prosthesis  removal      There were no vitals filed for this visit.  Visit Diagnosis:  Cognitive communication deficit      Subjective Assessment - 03/09/15 1404    Subjective "I graduated another class today - the OT."               ADULT SLP TREATMENT - 03/09/15 1404    General Information   Behavior/Cognition Cooperative;Pleasant mood;Distractible;Alert   Treatment Provided   Treatment provided Cognitive-Linquistic   Pain Assessment   Pain Assessment No/denies pain   Cognitive-Linquistic Treatment   Treatment focused on Cognition   Skilled Treatment Alternating attention facilitated between written task and auditory task (ID animals in a word stream) completed with 100% animals and 90% divided attnetion noted. In auditory (simple math) and word search task, pt was 92% successful  with responses but divided attention seen 25% of the time- alternating attentoin seen 75%. Errors ID'd with SLP assistance 90% of the time.   Assessment / Recommendations / Plan   Plan Continue with current plan of care   Progression Toward Goals   Progression toward goals Progressing toward goals  SLP Short Term Goals - 03/07/15 1310    SLP SHORT TERM GOAL #1   Title pt will demo alternating attention skills in mod complex cognitive-linguistic tasks with 95% success on both tasks over 3 sessions   Time 1   Period Weeks   Status Achieved   SLP SHORT TERM GOAL #2   Title pt will demo emergent awareness demo'd by correcting mod complex cognitive linguistic tasks 100% of the time during task, over two therapy sessions   Time 1   Period Weeks   Status Not Met          SLP Long Term Goals - 03/07/15 1311    SLP LONG TERM GOAL #1   Title pt will demo divided attention in functional cognitive-linguistic tasks with 90% success each task over two sessions   Time 4   Period Weeks   Status On-going   SLP LONG TERM GOAL #2   Title pt will demo anticipatory awareness and review cognitive linguistic therapy tasks 100% of the time in 3 therapy sessions   Time 4    Period Weeks   Status On-going          Plan - 03/10/15 1101    Clinical Impression Statement Pt continues to required min to mod A for simple divided attention tasks and error awareness. Continue skilled ST to maximize independence for high level cogntive activities   Speech Therapy Frequency 2x / week   Duration 4 weeks   Treatment/Interventions Patient/family education;Compensatory strategies;Internal/external aids;Cognitive reorganization;SLP instruction and feedback;Functional tasks;Cueing hierarchy   Potential to Achieve Goals Good        Problem List Patient Active Problem List   Diagnosis Date Noted  . 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 Hyperlipidemia 03/01/2015  . Chronic anticoagulation 03/01/2015  . 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 CVA (cerebral infarction) 12/31/2014  . Right hand weakness 12/31/2014  . TIA (transient ischemic attack) 12/31/2014  . Advanced directives, counseling/discussion 04/19/2014  . Spinal stenosis, lumbar region, with neurogenic claudication 12/23/2012  . Osteoarthritis, multiple sites 10/08/2012  . Routine general medical examination at a health care facility 02/25/2012  . NSTEMI (non-ST elevated myocardial infarction) 12/28/2011  . Chronic diastolic HF (heart failure) 12/28/2011    Class: Acute  . Frequent falls 12/28/2011  . Allergic rhinitis 11/11/2008  . Hyperlipemia 04/14/2008  . Anemia 04/14/2008  . Episodic mood disorder 04/14/2008  . Essential hypertension 04/14/2008  . Coronary atherosclerosis 04/14/2008  . Atrial fibrillation 04/14/2008  . COPD (chronic obstructive pulmonary disease) with emphysema 04/14/2008    [facility] , MS, CCC-SLP  03/10/2015, 11:03 AM  [facility] [address] [place], [place], [address] Phone: [phone]   Fax:  [phone]

## 2015-03-10 NOTE — Patient Instructions (Signed)
  Please complete the assigned speech therapy homework and return it to your next session.  

## 2015-03-11 ENCOUNTER — Encounter: Payer: Self-pay | Admitting: Emergency Medicine

## 2015-03-11 ENCOUNTER — Ambulatory Visit (INDEPENDENT_AMBULATORY_CARE_PROVIDER_SITE_OTHER): Payer: PPO | Admitting: Emergency Medicine

## 2015-03-11 VITALS — BP 118/66 | HR 78 | Ht 62.65 in | Wt 158.6 lb

## 2015-03-11 DIAGNOSIS — Z23 Encounter for immunization: Secondary | ICD-10-CM | POA: Diagnosis not present

## 2015-03-11 DIAGNOSIS — J438 Other emphysema: Secondary | ICD-10-CM

## 2015-03-11 NOTE — Assessment & Plan Note (Signed)
Severe disease but stable, good exercise tolerance. No exacerbations  Continue current BD regimen of spiriva and breo, albuterol prn Flu shot today Follow with Dr Lamonte Sakai in 6 months or sooner if you have any problems

## 2015-03-11 NOTE — Patient Instructions (Signed)
Please continue your Breo and Spiriva as you are using them  Keep albuterol available to use if needed for shortness of breath or wheezing. Continue zyrtec daily Use astelin nasal spray as needed for drainage, especially this Fall Flu shot today.  Follow with Dr Lamonte Sakai in 6 months or sooner if you have any problems

## 2015-03-11 NOTE — Progress Notes (Signed)
Subjective:    Patient ID: Eduardo [name], male    DOB: 10-22-1930, 81 y.o.   MRN: [id number] HPI 81 yo man, former smoker, CAD s/p MI 1/12, A Fib, allergies, exertional SOB for a few yrs. This has been worse since recent hosp in 1/12 - was cathed x 2, no intervention made, course c/b renal failure. 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 No hypoxemia reported with exercise. Was sent from the hospital on metoprolol (new). He has heard wheezing, especially when nasal allergies. Arlyce Harman at Dr Alla German office suggested AFL/COPD.   ROV 09/22/10 -- f/u SOB due to presumed COPD, CAD/MI, A Fib. Last time we started prn SABA to see if he would benefit - may have helped him some. He had full PFT's (09/18/10) . Dr Tamala Julian changed his metoprolol to verapamil but no change in breathing; he did get LE edema. He continues to have wheeze, cough, DOE. Also currently having severe PND and feels that it is contributing to his trouble. Taking allegra qd.  ROV 11/15/10 -- follow up for multifactorial dyspnea, CAD, probable COPD, allergic rhinitis. Returns today after we started Symbicort at last visit. He didn't fel that the SymbicorContinues to do Rehab at [facility]. The therapists were concerned that he was having exertional dyspnea, having to stop during workouts. Over last few days more allergy sx, on fexafenadine.   ROV 12/15/10 -- probable COPD, CAD, allergic rhinitis. He didn't improve w Symbicort, so we started Spiriva last visit. He believes that his breathing is a bit better. He has done better at Rehab on eliptical and treadmill. No hypoxemia. He is taking both allegra and zyrtec for allergies, as well as nasonex - still has severe PND and associated cough.   ROB 01/29/11 -- probable COPD, CAD, allergic rhinitis. Last time we discussed change from nasal steroid to astelin + NSW + zyrtec. He did not make the change. He isn't having the same degree of cough, but still with nasal gtt. Remains on Spiriva and  doing well.   ROV 05/14/11 -- probable COPD, CAD, allergic rhinitis. Returns for f/u. Last time changed nasal steroid to astepro. Still has significant nasal gtt, wheeze in the am, clearing mucous from throat. Arlyce Harman 4/12 was not impressive for significant AFL.   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 Was recently hospitalized with acute on chronic anemia while on coumadin. Also some degree of diastolic CHF and pulmonary edema. Feels much stronger, breathing is better. Occult blood negative. Fe started during the hosp.   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 Also hx chronic anemia on Fe. Returns f/u. He reports that his last Hgb w Dr Tamala Julian was 78. He is able to exert, but has some DOE after a block. Remains on Spiriva, uses SABA prn a couple times a day.  Lots of mucous and sinus drainage - taking zyrtec, NSW, astepro. Doing NSW before bed and then getting drainage to throat.   ROV 11/07/12 -- COPD, CAD, allergic rhinitis, chronic anemia. He reports that he has been having more problems at night - feels a drainage, a globus sensation, wheezing, coughing. No real snoring. He tries to clear his throat but there is nothing there. He stopped doing nasal saline washes months ago, still on zyrtec, astepro. Dr [name] added singulair recently.   ROV 02/06/13 -- COPD, CAD, allergic rhinitis, chronic anemia. He is still having trouble with cough especially at night. Has been doing  nasal saline at night. He had a 6 minute walk 8/28, walked shorter distance than he did previously, feels more SOB, has neuropathic pain. He is on spiriva, he is using albuterol 2x a day. Nasal congestion - on astepro qhs  ROV 03/05/13 -- COPD, CAD, allergic rhinitis, chronic anemia. Last time he was having more dyspnea, more cough. We tried adding Breo to Spiriva to see if he would benefit > his dyspnea and wheezing are both better. We discussed increasing  astepro but he hasn't needed to. He changed NSW to qam from bedtime.   ROV 07/15/13 -- COPD, CAD, allergic rhinitis, chronic anemia on Breo and Spiriva. He was seen 2/2 by PCP with URI sx > lots of nasal gtt, cough non-productive, some low grade fever. He was started on doxy + prednisone. The prednisone has helped him some. He is still having drainage. Remains on singulair, zyrtec, astepro nasal spray. Not using [place] regularly.   ROV 01/01/14 -- COPD, CAD, allergic rhinitis, chronic anemia on Breo and Spiriva. He has been doing very well, especially since change to [facility]. He has been active, breathing is improved. He is doing well on his usual allergy regimen.   ROV 08/27/14 -- follow up visit for COPD and allergic rhinitis, cough. He tells me that he has continued to have a lot of mucous - uses NSW's regularly. He does build up mucous overnight but it doesn't wake him. He is congested in the am. He is on zyrtec, no longer on singulair.  We discussed pursed lip breathing and incentive spirometry today. He is on Breo and tolerates well. His activity level is pretty good but spinal stenosis does slow him down. He works with a Clinical research associate 2x a week. No exacerbations since last time.   ROV 10/25/14 -- follow-up visit for COPD (FEV1 1.03 L or 50% predicted, 09/2010), cough and allergic rhinitis. He reports today progressive dyspnea for last 3 months, happens when sleeping and sometimes when supine. His wife has also noted some exertional SOB, has to stop after 200 feet. He is on Breo, but we stopped spiriva. With these new sx he added back spiriva 4 days ago. He is still able to do his daily workouts, trainer 2x a week.  No wheeze or cough. His wife is interested in looking into getting a scooter for him.   ROV 03/11/15 -- follow up visit for severe COPD, rhinitis and chronic cough. 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 His cognitive abilities are  improved to baseline. Breathing has been good since last visit.  He usually has trouble with Fall allergies. He is on Zyrtec, astelin prn.  He is stable on breo and spiriva. He is able to walk indefinitely. Sometimes coughs. No wheezing.   Review of Systems As per HPI    Objective:  Physical Exam Filed Vitals:   03/11/15 1627  BP: 118/66  Pulse: 78  Height: 5' 2.65" (1.591 m)  Weight: 158 lb 9.6 oz (71.94 kg)  SpO2: 97%   Gen: Pleasant, well-nourished, in no distress,  normal affect  ENT: No lesions,  mouth clear,  oropharynx clear, no postnasal drip  Neck: No JVD, insp and exp stridor  Lungs: No use of accessory muscles, referred UA noises.   Cardiovascular: irregular, heart sounds normal, no murmur or gallops, no peripheral edema  Musculoskeletal: No deformities, no cyanosis or clubbing  Neuro: alert, non focal  Skin: Warm, no lesions or rashes   Assessment & Plan:  COPD (chronic obstructive pulmonary disease) with emphysema Severe disease but stable, good exercise tolerance. No exacerbations  Continue current BD regimen of spiriva and breo, albuterol prn Flu shot today Follow with Dr Lamonte Sakai in 6 months or sooner if you have any problems

## 2015-03-14 ENCOUNTER — Telehealth: Payer: Self-pay

## 2015-03-14 NOTE — Telephone Encounter (Signed)
l mom to schedule f/u. Pt Sees Dr. Tamala Julian in [place] office

## 2015-03-14 NOTE — Telephone Encounter (Signed)
-----   Message from Stana Bunting, RN sent at 01/17/2015  7:58 AM EDT ----- Regarding: FW: appt Chante [place],  This is more your area of expertise. Can you work some magic? -Moo  ----- Message -----    From: Minna Merritts, MD    Sent: 01/16/2015  11:23 AM      To: Venia Carbon, MD, Stana Bunting, RN Subject: appt                                           No problem! Happy to help. Mandi,  can we set him up to be seen in [place]. Perhaps see when he is supposed to have his regular follow up with cardiology and we can step in line with that. thx Tim  ----- Message -----    From: Venia Carbon, MD    Sent: 01/12/2015  11:29 AM      To: Minna Merritts, MD  Tim,  He would like to switch to you ---closer for him in [place] and he has heard nice things about you. Can you set this up?  Rich

## 2015-03-16 ENCOUNTER — Encounter: Payer: PPO | Admitting: Occupational Therapy

## 2015-03-16 ENCOUNTER — Ambulatory Visit: Payer: PPO | Attending: Internal Medicine

## 2015-03-16 ENCOUNTER — Ambulatory Visit: Payer: PPO | Admitting: Physical Therapy

## 2015-03-16 DIAGNOSIS — R41841 Cognitive communication deficit: Secondary | ICD-10-CM | POA: Diagnosis present

## 2015-03-16 NOTE — Patient Instructions (Signed)
  Please complete the assigned speech therapy homework and return it to your next session.  

## 2015-03-16 NOTE — Therapy (Addendum)
[facility] [address] [place], [place], [address] Phone: [phone]   Fax:  [phone]  Speech Language Pathology Treatment  Patient Details  Name: Eduardo [name] MRN: [id number] Date of Birth: September 29, 1930 Referring Provider:  Venia Carbon, MD  Encounter Date: 03/16/2015      End of Session - 03/16/15 1712    Visit Number 10   Number of Visits 16   Date for SLP Re-Evaluation 04/05/15   SLP Start Time 1449   SLP Stop Time  1530   SLP Time Calculation (min) 41 min   Activity Tolerance Patient tolerated treatment well      Past Medical History  Diagnosis Date  . Anemia   . Anxiety   . Atrial fibrillation   . CAD (coronary artery disease)   . Hyperlipidemia   . COPD (chronic obstructive pulmonary disease)   . Allergy   . Osteoarthritis   . Hx of colonic polyps   . Hyperplastic polyp of intestine 2001  . Adenomatous polyp 2001  . Myocardial infarction 06/2010    "twice; 1 day apart; during/after cath"  . Exertional dyspnea   . S/P appy   . Arrhythmia   . Stroke     Past Surgical History  Procedure Laterality Date  . Appendectomy    . Carpal tunnel release      left  . Mastoidectomy      x3 on right  . Pilonidal cyst / sinus excision    . Knee arthroscopy      left, right knee 10/2009  . Shoulder arthroscopy      right  . Foot fracture surgery      right heel repair  . Nasal septum surgery      nasal septal repair  . Penile prosthesis implant      x 2--got infection after 2nd and had to remove  . Cataract extraction w/ intraocular lens implant  2/13    Dr [name]  . Carpal tunnel release  10/11/11    Dr Rush Barer  . Tonsillectomy and adenoidectomy    . Fracture surgery    . Cardiac catheterization  2012    50% LAD and luminal irreg in RCA, 1/12  Post cath MI from damage  . Penile prosthesis  removal      There were no vitals filed for this visit.  Visit  Diagnosis: Cognitive communication deficit      Subjective Assessment - 03/16/15 1456    Subjective I Watched the vice president's debate. It was a zoo."               ADULT SLP TREATMENT - 03/16/15 1456    General Information   Behavior/Cognition Cooperative;Pleasant mood;Distractible;Alert   Treatment Provided   Treatment provided Cognitive-Linquistic   Pain Assessment   Pain Assessment 0-10   Pain Score 1    Pain Location lower back   Pain Descriptors / Indicators Aching   Pain Intervention(s) Monitored during session   Cognitive-Linquistic Treatment   Treatment focused on Cognition   Skilled Treatment Alternating attention between two mod complex tasks completed with good success. 6-7 switches in 35 minutes   Assessment / Recommendations / Plan   Plan Continue with current plan of care   Progression Toward Goals   Progression toward goals Progressing toward goals            SLP Short Term Goals - 03/07/15 1310    SLP SHORT TERM GOAL #1  Title pt will demo alternating attention skills in mod complex cognitive-linguistic tasks with 95% success on both tasks over 3 sessions   Time 1   Period Weeks   Status Achieved   SLP SHORT TERM GOAL #2   Title pt will demo emergent awareness demo'd by correcting mod complex cognitive linguistic tasks 100% of the time during task, over two therapy sessions   Time 1   Period Weeks   Status Not Met          SLP Long Term Goals - 04/15/2015 1718    SLP LONG TERM GOAL #1   Title pt will demo divided attention in functional cognitive-linguistic tasks with 90% success each task over two sessions   Time 3   Period Weeks   Status On-going   SLP LONG TERM GOAL #2   Title pt will demo anticipatory awareness and review cognitive linguistic therapy tasks 100% of the time in 3 therapy sessions   Time 3   Period Weeks   Status On-going          Plan - 04-15-15 1713    Clinical Impression Statement Alternating attention  skills look WNL/WFL from today's session. Cont with divided attention tasks and work with error awareness.   Speech Therapy Frequency 2x / week   Duration --  3 weeks   Treatment/Interventions Patient/family education;Compensatory strategies;Internal/external aids;Cognitive reorganization;SLP instruction and feedback;Functional tasks;Cueing hierarchy   Potential to Achieve Goals Good          G-Codes - 2015-04-15 1719    Functional Limitations Attention   Attention Current Status (Z6109) At least 1 percent but less than 20 percent impaired, limited or restricted   Attention Goal Status (U0454) At least 1 percent but less than 20 percent impaired, limited or restricted      Speech Therapy Progress Note  Dates of Reporting Period: 02-04-15 to present  Objective Reports of Subjective Statement: Pt has completed 10 ST visits focusing on cognitive-linguistic skills   Objective Measurements: Pt's attention skills have improved, as have his skills with error awareness with simple and mod complex tasks. See above goals for details. He is now WFL/WNL with alternating attention skills between two mod complex linguistic tasks.  Goal Update: See above goals for details  Plan: Pt will be seen for approx 3 more weeks to address divided attention skills.  Reason Skilled Services are Required: Pt's skills in divided attention were WNL/WFL pre morbidly, skilled ST needed to maximize pt's cognitive-linguistic skills to improve pt's QOL.     Problem List Patient Active Problem List   Diagnosis Date Noted  . Cerebral infarction due to embolism of left carotid artery ([place]) 03/01/2015  . Hyperlipidemia 03/01/2015  . Chronic anticoagulation 03/01/2015  . 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 CVA (cerebral infarction) 12/31/2014  . Right hand weakness 12/31/2014  . TIA (transient ischemic attack) 12/31/2014  . Advanced directives, counseling/discussion 04/19/2014  . Spinal  stenosis, lumbar region, with neurogenic claudication 12/23/2012  . Osteoarthritis, multiple sites 10/08/2012  . Routine general medical examination at a health care facility 02/25/2012  . NSTEMI (non-ST elevated myocardial infarction) ([place]) 12/28/2011  . Chronic diastolic HF (heart failure) ([place]) 12/28/2011    Class: Acute  . Frequent falls 12/28/2011  . Allergic rhinitis 11/11/2008  . Hyperlipemia 04/14/2008  . Anemia 04/14/2008  . Episodic mood disorder ([place]) 04/14/2008  . Essential hypertension 04/14/2008  . Coronary atherosclerosis 04/14/2008  . Atrial fibrillation ([place]) 04/14/2008  . COPD (chronic obstructive pulmonary  disease) with emphysema ([place]) 04/14/2008    [facility] , [place], [place]  03/16/2015, 5:19 PM  [facility] [address] [place], [place], [address] Phone: [phone]   Fax:  [phone]

## 2015-03-18 ENCOUNTER — Ambulatory Visit: Payer: PPO

## 2015-03-18 ENCOUNTER — Encounter: Payer: PPO | Admitting: Occupational Therapy

## 2015-03-18 ENCOUNTER — Other Ambulatory Visit: Payer: Self-pay | Admitting: *Deleted

## 2015-03-18 ENCOUNTER — Ambulatory Visit: Payer: PPO | Admitting: Physical Therapy

## 2015-03-18 MED ORDER — FENOFIBRATE 160 MG PO TABS: 160.0000 mg | ORAL_TABLET | Freq: Every day | ORAL | Status: DC

## 2015-03-22 ENCOUNTER — Ambulatory Visit: Payer: PPO

## 2015-03-22 ENCOUNTER — Ambulatory Visit: Payer: PPO | Admitting: Physical Therapy

## 2015-03-22 ENCOUNTER — Encounter: Payer: PPO | Admitting: Occupational Therapy

## 2015-03-22 DIAGNOSIS — R41841 Cognitive communication deficit: Secondary | ICD-10-CM | POA: Diagnosis not present

## 2015-03-22 NOTE — Therapy (Signed)
[facility] [address] [place], [place], [address] Phone: [phone]   Fax:  [phone]  Speech Language Pathology Treatment  Patient Details  Name: Eduardo [name] MRN: [id number] Date of Birth: April 24, 1931 Referring Provider:  Venia Carbon, MD  Encounter Date: 03/22/2015      End of Session - 03/22/15 1403    Visit Number 11   Number of Visits 16   Date for SLP Re-Evaluation 04/05/15   Authorization Type G - CODE!!!   SLP Start Time 1317   SLP Stop Time  1400   SLP Time Calculation (min) 43 min   Activity Tolerance Patient tolerated treatment well      Past Medical History  Diagnosis Date  . Anemia   . Anxiety   . Atrial fibrillation   . CAD (coronary artery disease)   . Hyperlipidemia   . COPD (chronic obstructive pulmonary disease)   . Allergy   . Osteoarthritis   . Hx of colonic polyps   . Hyperplastic polyp of intestine 2001  . Adenomatous polyp 2001  . Myocardial infarction 06/2010    "twice; 1 day apart; during/after cath"  . Exertional dyspnea   . S/P appy   . Arrhythmia   . Stroke     Past Surgical History  Procedure Laterality Date  . Appendectomy    . Carpal tunnel release      left  . Mastoidectomy      x3 on right  . Pilonidal cyst / sinus excision    . Knee arthroscopy      left, right knee 10/2009  . Shoulder arthroscopy      right  . Foot fracture surgery      right heel repair  . Nasal septum surgery      nasal septal repair  . Penile prosthesis implant      x 2--got infection after 2nd and had to remove  . Cataract extraction w/ intraocular lens implant  2/13    Dr [name]  . Carpal tunnel release  10/11/11    Dr Rush Barer  . Tonsillectomy and adenoidectomy    . Fracture surgery    . Cardiac catheterization  2012    50% LAD and luminal irreg in RCA, 1/12  Post cath MI from damage  . Penile prosthesis  removal      There were no vitals filed  for this visit.  Visit Diagnosis: Cognitive communication deficit      Subjective Assessment - 03/22/15 1325    Subjective "Eduardo [name], I'm doing pretty good. I think today will be my last day." Pt arrives witih 90% of homework completed.               ADULT SLP TREATMENT - 03/22/15 1320    General Information   Behavior/Cognition Cooperative;Pleasant mood;Distractible;Alert   Treatment Provided   Treatment provided Cognitive-Linquistic   Pain Assessment   Pain Assessment 0-10   Pain Score 1    Pain Location lower back   Pain Descriptors / Indicators Aching   Pain Intervention(s) Monitored during session   Cognitive-Linquistic Treatment   Treatment focused on Cognition   Skilled Treatment Divided attention tasks (written/auditory) completed with  35% divided and 65% alternating attention. Pt reports he will likely stop one task and complete the other if needed, with challenging divided attention tasks in the future.   Assessment / Recommendations / Plan   Plan Continue with current plan of care   Progression Toward Goals  Progression toward goals Progressing toward goals            SLP Short Term Goals - 03/07/15 1310    SLP SHORT TERM GOAL #1   Title pt will demo alternating attention skills in mod complex cognitive-linguistic tasks with 95% success on both tasks over 3 sessions   Time 1   Period Weeks   Status Achieved   SLP SHORT TERM GOAL #2   Title pt will demo emergent awareness demo'd by correcting mod complex cognitive linguistic tasks 100% of the time during task, over two therapy sessions   Time 1   Period Weeks   Status Not Met          SLP Long Term Goals - 04-19-2015 1404    SLP LONG TERM GOAL #1   Title pt will demo divided attention in functional cognitive-linguistic tasks with 90% success each task over two sessions   Status Not Met   SLP LONG TERM GOAL #2   Title pt will demo anticipatory awareness and review cognitive linguistic therapy tasks  100% of the time in 3 therapy sessions   Status Not Met          Plan - 19-Apr-2015 1404    Clinical Impression Statement Pt would like to discharge today. He is pleased with progress and no longer desires ST.   Potential to Achieve Goals Good          G-Codes - Apr 19, 2015 1406    Functional Assessment Tool Used noms    Functional Limitations Attention   Attention Goal Status [phone]) At least 1 percent but less than 20 percent impaired, limited or restricted   Attention Discharge Status ([id number]) At least 1 percent but less than 20 percent impaired, limited or restricted      [place]  Visits from Start of Care: 11  Current functional level related to goals / functional outcomes: See goal summary above. Pt had deficits lingering in cognitive-linguistics, specifically in divided attention. Pt indicated in situations with divided attention he would likely copmlete one task then do the other. Pt requested discharge as he is pleased with progress.   Remaining deficits: Attention   Education / Equipment: Therapy plan   Plan: Patient agrees to discharge.  Patient goals were partially met. Patient is being discharged due to being pleased with the current functional level.  ?????       Problem List Patient Active Problem List   Diagnosis Date Noted  . 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 Hyperlipidemia 03/01/2015  . Chronic anticoagulation 03/01/2015  . 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 CVA (cerebral infarction) 12/31/2014  . Right hand weakness 12/31/2014  . TIA (transient ischemic attack) 12/31/2014  . Advanced directives, counseling/discussion 04/19/2014  . Spinal stenosis, lumbar region, with neurogenic claudication 12/23/2012  . Osteoarthritis, multiple sites 10/08/2012  . Routine general medical examination at a health care facility 02/25/2012  . NSTEMI (non-ST elevated  myocardial infarction) ([place]) 12/28/2011  . Chronic diastolic HF (heart failure) ([place]) 12/28/2011    Class: Acute  . Frequent falls 12/28/2011  . Allergic rhinitis 11/11/2008  . Hyperlipemia 04/14/2008  . Anemia 04/14/2008  . Episodic mood disorder ([place]) 04/14/2008  . Essential hypertension 04/14/2008  . Coronary atherosclerosis 04/14/2008  . Atrial fibrillation ([place]) 04/14/2008  . COPD (chronic obstructive pulmonary disease) with emphysema ([place]) 04/14/2008    Najee Cowens , [place], [place]  04-19-15, 2:11 PM  [place]  [facility] [address] [place], [place], [address] Phone: [phone]   Fax:  [phone]

## 2015-03-25 ENCOUNTER — Encounter: Payer: PPO | Admitting: Occupational Therapy

## 2015-03-25 ENCOUNTER — Ambulatory Visit: Payer: PPO | Admitting: Physical Therapy

## 2015-04-25 ENCOUNTER — Ambulatory Visit: Payer: PPO | Admitting: Interventional Cardiology

## 2015-04-26 ENCOUNTER — Telehealth: Payer: Self-pay | Admitting: Interventional Cardiology

## 2015-04-26 NOTE — Telephone Encounter (Signed)
I don't know if there is a hazard with tumeric and his current medical regimen. I will ask Gay Filler.

## 2015-04-26 NOTE — Telephone Encounter (Signed)
New message     Pt C/O medication issue:  1. Name of Medication: Tumeric - Seen Dr. Sharlet [place] @  [facility] on yesterday  2. How are you currently taking this medication (dosage and times per day)? Has not started yet   3. Are you having a reaction (difficulty breathing--STAT)? Has not started yet - patient has pain in back    4. What is your medication issue? Should he used Tumeric as antiflammatory

## 2015-04-26 NOTE — Telephone Encounter (Signed)
Fwd to Dr.Smith to advise 

## 2015-04-27 NOTE — Telephone Encounter (Signed)
Tumeric may increase his bleeding risk slightly since he is on Eliquis.  Will just need to watch for any signs of bleeding or excessive bruising.

## 2015-04-27 NOTE — Telephone Encounter (Signed)
Called to give pt Dr.Smith's and Sally,Pharm-D recommendation. [email or web address]

## 2015-04-28 NOTE — Telephone Encounter (Signed)
Left a detailed message on pt voicemail as requested.  Per Rogers Blocker, RPH at 04/27/2015 4:16 PM             Tumeric may increase his bleeding risk slightly since he is on Eliquis. Will just need to watch for any signs of bleeding or excessive bruising.       Pt is to call back if he has any additional questions

## 2015-04-28 NOTE — Telephone Encounter (Signed)
Follow up      Returning a call to the nurse.  OK to leave a message on vm

## 2015-04-29 ENCOUNTER — Other Ambulatory Visit: Payer: Self-pay | Admitting: Internal Medicine

## 2015-04-29 DIAGNOSIS — E785 Hyperlipidemia, unspecified: Secondary | ICD-10-CM

## 2015-05-03 ENCOUNTER — Encounter: Payer: Self-pay | Admitting: Cardiovascular Disease

## 2015-05-03 ENCOUNTER — Ambulatory Visit (INDEPENDENT_AMBULATORY_CARE_PROVIDER_SITE_OTHER): Payer: PPO | Admitting: Cardiovascular Disease

## 2015-05-03 VITALS — BP 140/60 | HR 63 | Ht 63.0 in | Wt 160.0 lb

## 2015-05-03 DIAGNOSIS — J438 Other emphysema: Secondary | ICD-10-CM | POA: Diagnosis not present

## 2015-05-03 DIAGNOSIS — I4891 Unspecified atrial fibrillation: Secondary | ICD-10-CM

## 2015-05-03 DIAGNOSIS — R0602 Shortness of breath: Secondary | ICD-10-CM | POA: Diagnosis not present

## 2015-05-03 DIAGNOSIS — I214 Non-ST elevation (NSTEMI) myocardial infarction: Secondary | ICD-10-CM | POA: Diagnosis not present

## 2015-05-03 DIAGNOSIS — E785 Hyperlipidemia, unspecified: Secondary | ICD-10-CM

## 2015-05-03 DIAGNOSIS — I63132 Cerebral infarction due to embolism of left carotid artery: Secondary | ICD-10-CM

## 2015-05-03 DIAGNOSIS — I2510 Atherosclerotic heart disease of native coronary artery without angina pectoris: Secondary | ICD-10-CM

## 2015-05-03 DIAGNOSIS — Z7189 Other specified counseling: Secondary | ICD-10-CM | POA: Insufficient documentation

## 2015-05-03 NOTE — Progress Notes (Signed)
Patient ID: Eduardo [name], male    DOB: 31-May-1931, 81 y.o.   MRN: [id number]  HPI Comments: Eduardo [name] is a 81 y.o. male who presents for a second opinion regarding his cardiac issues including chronic atrial fibrillation, embolic stroke, anticoagulation discussion, coronary artery disease, hypertension. For many years he has been well managed in [place]  History dates back to cardiac catheterization in 2012 with moderate LAD and RCA disease noted at that time, STEMI with repeat catheterization documenting RCA dissection, managed medically  Echocardiogram showing severe biatrial enlargement, calcification of the mitral valve, normal ejection fraction Carotid ultrasound with less than 39% bilateral carotid disease  Prior history of GI bleed/profound anemia in July 2013 with a fall at the time Hemoglobin at that time was around 6. 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, periodically seen by the pain clinic  Suffered a stroke July [id number] felt to be embolic, anticoagulation restarted So far he has been tolerating eliquis 5 mill grams twice a day with no symptoms, denies any bleeding  Relatively sedentary, limited by his back pain, does exercise 2 days per week No symptoms of chest pain on exertion breath   Verapamil dose recently decreased from 240 mg daily down to 180 mg daily for bradycardia On a lower dose he has noticed heart rate has improved Denies any lightheadedness or dizziness on standing Overall feels well with no complaints   lab work reviewed with him showing chronically elevated triglycerides for which she is on fenofibrate. Total cholesterol at goal, less than 150 hemoglobin A1c less than 6 Nonsmoker   EKG on today's visit shows atrial fibrillation with ventricular rate 63 bpm, nonspecific ST abnormality   Allergies  Allergen Reactions  .  Penicillins Itching  . Sulfonamide Derivatives Other (See Comments)    "it affected my kidneys"    Current Outpatient Prescriptions on File Prior to Visit  Medication Sig Dispense Refill  . albuterol (PROAIR HFA) 108 (90 BASE) MCG/ACT inhaler Inhale 2 puffs into the lungs every 6 (six) hours as needed for wheezing or shortness of breath. 3 Inhaler 3  . apixaban (ELIQUIS) 5 MG TABS tablet Take 1 tablet (5 mg total) by mouth 2 (two) times daily. 60 tablet 11  . aspirin 81 MG tablet Take 81 mg by mouth daily.    Marland Kitchen atorvastatin (LIPITOR) 20 MG tablet Take 1 tablet (20 mg total) by mouth daily. 90 tablet 3  . azelastine (ASTELIN) 0.1 % nasal spray Place 2 sprays into both nostrils daily. Use in each nostril as directed    . B Complex-C (SUPER B COMPLEX PO) Take by mouth.    . cetirizine (ZYRTEC) 10 MG tablet Take 10 mg by mouth daily.    Marland Kitchen escitalopram (LEXAPRO) 20 MG tablet Take 20 mg by mouth daily.     . fenofibrate 160 MG tablet Take 1 tablet (160 mg total) by mouth daily. 90 tablet 0  . Fluticasone Furoate-Vilanterol (BREO ELLIPTA) 100-25 MCG/INH AEPB Inhale 2 puffs into the lungs at bedtime. 180 each 3  . Multiple Vitamin (MULTIVITAMIN) tablet Take 1 tablet by mouth daily.    . Tiotropium Bromide Monohydrate (SPIRIVA RESPIMAT) 2.5 MCG/ACT AERS Inhale 2 puffs into the lungs every morning.     . verapamil (CALAN-SR) 180 MG CR tablet Take 1 tablet (180 mg total) by mouth at bedtime. 30 tablet 11  No current facility-administered medications on file prior to visit.    Past Medical History  Diagnosis Date  . Anemia   . Anxiety   . Atrial fibrillation ([place])   . CAD (coronary artery disease)   . Hyperlipidemia   . COPD (chronic obstructive pulmonary disease) ([place])   . Allergy   . Osteoarthritis   . Hx of colonic polyps   . Hyperplastic polyp of intestine 2001  . Adenomatous polyp 2001  . Myocardial infarction ([place]) 06/2010    "twice; 1 day apart; during/after cath"  . Exertional  dyspnea   . S/P appy   . Arrhythmia   . Stroke [facility])     Past Surgical History  Procedure Laterality Date  . Appendectomy    . Carpal tunnel release      left  . Mastoidectomy      x3 on right  . Pilonidal cyst / sinus excision    . Knee arthroscopy      left, right knee 10/2009  . Shoulder arthroscopy      right  . Foot fracture surgery      right heel repair  . Nasal septum surgery      nasal septal repair  . Penile prosthesis implant      x 2--got infection after 2nd and had to remove  . Cataract extraction w/ intraocular lens implant  2/13    Dr [name]  . Carpal tunnel release  10/11/11    Dr Rush Barer  . Tonsillectomy and adenoidectomy    . Fracture surgery    . Cardiac catheterization  2012    50% LAD and luminal irreg in RCA, 1/12  Post cath MI from damage  . Penile prosthesis  removal      Social History  reports that he quit smoking about 36 years ago. His smoking use included Cigarettes. He has a 75 pack-year smoking history. He has never used smokeless tobacco. He reports that he does not drink alcohol or use illicit drugs.  Family History family history includes Cancer in his mother; Heart attack in his father. There is no history of Diabetes or Hypertension.   Review of Systems  Constitutional: Negative.   Respiratory: Negative.   Cardiovascular: Negative.   Gastrointestinal: Negative.   Musculoskeletal: Positive for back pain and arthralgias.  Skin: Negative.   Allergic/Immunologic: Negative.   Neurological: Negative.   Hematological: Negative.   Psychiatric/Behavioral: Negative.   All other systems reviewed and are negative.   BP 140/60 mmHg  Pulse 63  Ht 5\' 3"  (1.6 m)  Wt 160 lb (72.576 kg)  BMI 28.35 kg/m2  Physical Exam  Constitutional: He is oriented to person, place, and time. He appears well-developed and well-nourished.  HENT:  Head: Normocephalic.  Nose: Nose normal.  Mouth/Throat: Oropharynx is clear and moist.   Eyes: Conjunctivae are normal. Pupils are equal, round, and reactive to light.  Neck: Normal range of motion. Neck supple. No JVD present.  Cardiovascular: Normal rate, normal heart sounds and intact distal pulses.  An irregularly irregular rhythm present. Exam reveals no gallop and no friction rub.   No murmur heard. Pulmonary/Chest: Effort normal and breath sounds normal. No respiratory distress. He has no wheezes. He has no rales. He exhibits no tenderness.  Abdominal: Soft. Bowel sounds are normal. He exhibits no distension. There is no tenderness.  Musculoskeletal: Normal range of motion. He exhibits no edema or tenderness.  Lymphadenopathy:    He has no cervical adenopathy.  Neurological: He is  alert and oriented to person, place, and time. Coordination normal.  Skin: Skin is warm and dry. No rash noted. No erythema.  Psychiatric: He has a normal mood and affect. His behavior is normal. Judgment and thought content normal.

## 2015-05-03 NOTE — Assessment & Plan Note (Signed)
Prior cardiac catheterization showing 50-70% LAD disease, moderate RCA disease Dissection of RCA treated medically

## 2015-05-03 NOTE — Assessment & Plan Note (Signed)
He denies any angina  Cholesterol at goal,  No further testing needed  Will defer back to Dr. Tamala Julian

## 2015-05-03 NOTE — Assessment & Plan Note (Signed)
Very mild bilateral carotid disease  Previous embolic stroke likely secondary to atrial fibrillation, was not on anticoagulation at the time

## 2015-05-03 NOTE — Patient Instructions (Signed)
You are doing well. No medication changes were made.  Please call us if you have new issues that need to be addressed before your next appt.    

## 2015-05-03 NOTE — Assessment & Plan Note (Signed)
Cholesterol at goal  He does not want  Additional medications for triglycerides

## 2015-05-03 NOTE — Assessment & Plan Note (Signed)
We did discuss the risk and benefit of eliquis. He is willing to stay on his current medications Recommended he hold the medication and call us for any severe fall or bleeding

## 2015-05-03 NOTE — Assessment & Plan Note (Signed)
Long discussion concerning anticoagulation.  He is tolerating Eliquis  5 mg twice a day  He does have a significant fall risk history , bleeding risk  If he does have a significant bleed in the future, perhaps would be a candidate for a Watchman device  To close the left atrial appendage

## 2015-05-03 NOTE — Assessment & Plan Note (Signed)
Stable chronic mild shortness of breath, ambulates without oxygen, no recent COPD exacerbation

## 2015-05-11 ENCOUNTER — Other Ambulatory Visit: Payer: Medicare Other

## 2015-05-12 ENCOUNTER — Other Ambulatory Visit (INDEPENDENT_AMBULATORY_CARE_PROVIDER_SITE_OTHER): Payer: PPO

## 2015-05-12 ENCOUNTER — Encounter: Payer: Self-pay | Admitting: Gastroenterology

## 2015-05-12 DIAGNOSIS — E785 Hyperlipidemia, unspecified: Secondary | ICD-10-CM | POA: Diagnosis not present

## 2015-05-12 LAB — COMPREHENSIVE METABOLIC PANEL
ALT: 18 U/L (ref 0–53)
AST: 26 U/L (ref 0–37)
Albumin: 4.2 g/dL (ref 3.5–5.2)
Alkaline Phosphatase: 44 U/L (ref 39–117)
BILIRUBIN TOTAL: 0.5 mg/dL (ref 0.2–1.2)
BUN: 28 mg/dL — ABNORMAL HIGH (ref 6–23)
CALCIUM: 10.2 mg/dL (ref 8.4–10.5)
CHLORIDE: 105 meq/L (ref 96–112)
CO2: 30 meq/L (ref 19–32)
Creatinine, Ser: 1.05 mg/dL (ref 0.40–1.50)
GFR: 71.86 mL/min (ref >60.00)
Glucose, Bld: 83 mg/dL (ref 70–99)
POTASSIUM: 4.5 meq/L (ref 3.5–5.1)
Sodium: 141 mEq/L (ref 135–145)
Total Protein: 7.2 g/dL (ref 6.0–8.3)

## 2015-05-12 LAB — CBC WITH DIFFERENTIAL/PLATELET
BASOS ABS: 0.1 10*3/uL (ref 0.0–0.1)
Basophils Relative: 0.7 % (ref 0.0–3.0)
EOS PCT: 4.9 % (ref 0.0–5.0)
Eosinophils Absolute: 0.4 10*3/uL (ref 0.0–0.7)
HCT: 38.3 % — ABNORMAL LOW (ref 39.0–52.0)
Hemoglobin: 12.7 g/dL — ABNORMAL LOW (ref 13.0–17.0)
LYMPHS PCT: 27.2 % (ref 12.0–46.0)
Lymphs Abs: 2.1 10*3/uL (ref 0.7–4.0)
MCHC: 33.2 g/dL (ref 30.0–36.0)
MCV: 87 fl (ref 78.0–100.0)
MONOS PCT: 7.8 % (ref 3.0–12.0)
Monocytes Absolute: 0.6 10*3/uL (ref 0.1–1.0)
NEUTROS ABS: 4.5 10*3/uL (ref 1.4–7.7)
Neutrophils Relative %: 59.4 % (ref 43.0–77.0)
PLATELETS: 207 10*3/uL (ref 150.0–400.0)
RBC: 4.4 Mil/uL (ref 4.22–5.81)
RDW: 15.3 % (ref 11.5–15.5)
WBC: 7.5 10*3/uL (ref 4.0–10.5)

## 2015-05-12 LAB — LIPID PANEL
Cholesterol: 150 mg/dL (ref 0–200)
HDL: 44.1 mg/dL (ref >39.00)
Total CHOL/HDL Ratio: 3

## 2015-05-12 LAB — T4, FREE: Free T4: 0.77 ng/dL (ref 0.60–1.60)

## 2015-05-12 LAB — LDL CHOLESTEROL, DIRECT: LDL DIRECT: 88 mg/dL

## 2015-05-18 ENCOUNTER — Encounter: Payer: Self-pay | Admitting: Internal Medicine

## 2015-05-18 ENCOUNTER — Ambulatory Visit (INDEPENDENT_AMBULATORY_CARE_PROVIDER_SITE_OTHER): Payer: PPO | Admitting: Internal Medicine

## 2015-05-18 VITALS — BP 128/68 | HR 67 | Temp 98.1°F | Ht 63.0 in | Wt 157.0 lb

## 2015-05-18 DIAGNOSIS — I69351 Hemiplegia and hemiparesis following cerebral infarction affecting right dominant side: Secondary | ICD-10-CM | POA: Diagnosis not present

## 2015-05-18 DIAGNOSIS — Z0000 Encounter for general adult medical examination without abnormal findings: Secondary | ICD-10-CM

## 2015-05-18 DIAGNOSIS — I5032 Chronic diastolic (congestive) heart failure: Secondary | ICD-10-CM | POA: Diagnosis not present

## 2015-05-18 DIAGNOSIS — Z7189 Other specified counseling: Secondary | ICD-10-CM

## 2015-05-18 DIAGNOSIS — I481 Persistent atrial fibrillation: Secondary | ICD-10-CM | POA: Diagnosis not present

## 2015-05-18 DIAGNOSIS — I4819 Other persistent atrial fibrillation: Secondary | ICD-10-CM

## 2015-05-18 DIAGNOSIS — D649 Anemia, unspecified: Secondary | ICD-10-CM

## 2015-05-18 DIAGNOSIS — J439 Emphysema, unspecified: Secondary | ICD-10-CM | POA: Diagnosis not present

## 2015-05-18 DIAGNOSIS — F39 Unspecified mood [affective] disorder: Secondary | ICD-10-CM

## 2015-05-18 NOTE — Progress Notes (Signed)
Subjective:    Patient ID: Eduardo [name], male    DOB: 23-Jul-1930, 81 y.o.   MRN: [id number]  HPI Here for Medicare wellness and follow up of chronic medical problems Reviewed form and advanced directives Reviewed other doctors Wife is here No alcohol or tobacco Regular exercise Vision is fine. No significant hearing problems Independent with instrumental ADLs Mildly forgetful--but no worrisome cognitive concerns  Right hand is mostly better---trouble writing but no trouble eating, etc Is right handed Done with all rehab He never was concerned about his language Still working for balance training--walks with cane  No palpitations No chest pain Some SOB if he pushes it at the gym No dizziness or syncope Remains on the eliquis  Regular cough--but not a lot Feels post nasal drip may contribute to this Almost always dry Rarely needs the albuterol  Still sees Dr Nicolasa Ducking for depression Mood seems to be okay  Current Outpatient Prescriptions on File Prior to Visit  Medication Sig Dispense Refill  . albuterol (PROAIR HFA) 108 (90 BASE) MCG/ACT inhaler Inhale 2 puffs into the lungs every 6 (six) hours as needed for wheezing or shortness of breath. 3 Inhaler 3  . apixaban (ELIQUIS) 5 MG TABS tablet Take 1 tablet (5 mg total) by mouth 2 (two) times daily. 60 tablet 11  . aspirin 81 MG tablet Take 81 mg by mouth daily.    Marland Kitchen atorvastatin (LIPITOR) 20 MG tablet Take 1 tablet (20 mg total) by mouth daily. 90 tablet 3  . azelastine (ASTELIN) 0.1 % nasal spray Place 2 sprays into both nostrils daily. Use in each nostril as directed    . B Complex-C (SUPER B COMPLEX PO) Take by mouth.    . cetirizine (ZYRTEC) 10 MG tablet Take 10 mg by mouth daily.    Marland Kitchen escitalopram (LEXAPRO) 20 MG tablet Take 20 mg by mouth daily.     . fenofibrate 160 MG tablet Take 1 tablet (160 mg total) by mouth daily. 90 tablet 0  . Flavocoxid-Cit Zn Bisglcinate (LIMBREL500) 500-50 MG CAPS 1 po bid prn    .  Fluticasone Furoate-Vilanterol (BREO ELLIPTA) 100-25 MCG/INH AEPB Inhale 2 puffs into the lungs at bedtime. 180 each 3  . Multiple Vitamin (MULTIVITAMIN) tablet Take 1 tablet by mouth daily.    . Tiotropium Bromide Monohydrate (SPIRIVA RESPIMAT) 2.5 MCG/ACT AERS Inhale 2 puffs into the lungs every morning.     . verapamil (CALAN-SR) 180 MG CR tablet Take 1 tablet (180 mg total) by mouth at bedtime. 30 tablet 11   No current facility-administered medications on file prior to visit.    Allergies  Allergen Reactions  . Penicillins Itching  . Sulfonamide Derivatives Other (See Comments)    "it affected my kidneys"    Past Medical History  Diagnosis Date  . Anemia   . Anxiety   . Atrial fibrillation ([place])   . CAD (coronary artery disease)   . Hyperlipidemia   . COPD (chronic obstructive pulmonary disease) ([place])   . Allergy   . Osteoarthritis   . Hx of colonic polyps   . Hyperplastic polyp of intestine 2001  . Adenomatous polyp 2001  . Myocardial infarction ([place]) 06/2010    "twice; 1 day apart; during/after cath"  . Exertional dyspnea   . S/P appy   . Arrhythmia   . Stroke [facility])     Past Surgical History  Procedure Laterality Date  . Appendectomy    . Carpal tunnel release  left  . Mastoidectomy      x3 on right  . Pilonidal cyst / sinus excision    . Knee arthroscopy      left, right knee 10/2009  . Shoulder arthroscopy      right  . Foot fracture surgery      right heel repair  . Nasal septum surgery      nasal septal repair  . Penile prosthesis implant      x 2--got infection after 2nd and had to remove  . Cataract extraction w/ intraocular lens implant  2/13    Dr [name]  . Carpal tunnel release  10/11/11    Dr Rush Barer  . Tonsillectomy and adenoidectomy    . Fracture surgery    . Cardiac catheterization  2012    50% LAD and luminal irreg in RCA, 1/12  Post cath MI from damage  . Penile prosthesis  removal      Family History  Problem  Relation Age of Onset  . Diabetes Neg Hx   . Hypertension Neg Hx   . Heart attack Father   . Cancer Mother     ? leukemia    Social History   Social History  . Marital Status: Married    Spouse Name: N/A  . Number of Children: 4  . Years of Education: N/A   Occupational History  . Retired Chief Strategy Officer the job trainin)    Social History Main Topics  . Smoking status: Former Smoker -- 2.50 packs/day for 30 years    Types: Cigarettes    Quit date: 06/11/1978  . Smokeless tobacco: Never Used  . Alcohol Use: No     Comment: QUIT [id number] alcoholic  . Drug Use: No  . Sexual Activity: Not Currently   Other Topics Concern  . Not on file   Social History Narrative   Grew up in [place]   Married---2nd   2 children ([place], [place]  and near [place])--2 stepchildren   Former Smoker--quit in 1980   Alcohol use-no. Quit in 1980--alcoholic      Has living will.   Has designated wife, then step daughter [name] have health care POA.   Would accept resuscitation attempts.   No feeding tube if cognitively unaware.            Review of Systems Having bothersome joint pains---okay to take tylenol Appetite is good Sleeps okay Weight is stable Wears seat belt Teeth okay---regular with dentist Bowels fine--no blood Voids okay-- no hematuria Some spots on skin---derm is treating    Objective:   Physical Exam  Constitutional: He is oriented to person, place, and time. He appears well-developed and well-nourished. No distress.  HENT:  Mouth/Throat: Oropharynx is clear and moist. No oropharyngeal exudate.  Neck: Normal range of motion. Neck supple. No thyromegaly present.  Cardiovascular: Normal rate and normal heart sounds.  Exam reveals no gallop.   No murmur heard. Irregular Feet cool but faint pulses  Pulmonary/Chest: Effort normal and breath sounds normal. No respiratory distress. He has no wheezes. He has no rales.  Abdominal: Soft. There is no  tenderness.  Musculoskeletal: He exhibits no edema or tenderness.  Lymphadenopathy:    He has no cervical adenopathy.  Neurological: He is alert and oriented to person, place, and time.  President-- blocked out [id number] Recall 2/3  Skin: No rash noted. No erythema.  Psychiatric: He has a normal mood and affect. His behavior is normal.  Assessment & Plan:

## 2015-05-18 NOTE — Assessment & Plan Note (Signed)
I have personally reviewed the Medicare Annual Wellness questionnaire and have noted 1. The patient's medical and social history 2. Their use of alcohol, tobacco or illicit drugs 3. Their current medications and supplements 4. The patient's functional ability including ADL's, fall risks, home safety risks and hearing or visual             impairment. 5. Diet and physical activities 6. Evidence for depression or mood disorders  The patients weight, height, BMI and visual acuity have been recorded in the chart I have made referrals, counseling and provided education to the patient based review of the above and I have provided the pt with a written personalized care plan for preventive services.  I have provided you with a copy of your personalized plan for preventive services. Please take the time to review along with your updated medication list.  UTD on imms Exercises regularly No cancer screening due to age

## 2015-05-18 NOTE — Assessment & Plan Note (Signed)
See social history 

## 2015-05-18 NOTE — Assessment & Plan Note (Signed)
Good rate control On apixaban

## 2015-05-18 NOTE — Assessment & Plan Note (Signed)
Minimal right hand trouble Mostly resolved On appropriate secondary infection

## 2015-05-18 NOTE — Assessment & Plan Note (Signed)
Controlled on current regimen Limited exercise tolerance but no decline

## 2015-05-18 NOTE — Progress Notes (Signed)
Pre visit review using our clinic review tool, if applicable. No additional management support is needed unless otherwise documented below in the visit note. 

## 2015-05-18 NOTE — Assessment & Plan Note (Signed)
Chronic depression is controlled Sees Dr Nicolasa Ducking still

## 2015-05-18 NOTE — Assessment & Plan Note (Signed)
Compensated No changes needed Will stick with Dr Tamala Julian

## 2015-05-18 NOTE — Assessment & Plan Note (Signed)
Goes back at least 3 years No Rx

## 2015-05-20 ENCOUNTER — Telehealth: Payer: Self-pay | Admitting: Emergency Medicine

## 2015-05-20 NOTE — Telephone Encounter (Signed)
Patient Returned call  [phone]

## 2015-05-20 NOTE — Telephone Encounter (Signed)
lmtcb X1 for pt  

## 2015-05-20 NOTE — Telephone Encounter (Signed)
lmtcb X2 for pt.  

## 2015-05-23 ENCOUNTER — Ambulatory Visit (INDEPENDENT_AMBULATORY_CARE_PROVIDER_SITE_OTHER)
Admission: RE | Admit: 2015-05-23 | Discharge: 2015-05-23 | Disposition: A | Discharge status: Home / Self Care | Payer: PPO | Source: Ambulatory Visit | Attending: Internal Medicine | Admitting: Internal Medicine

## 2015-05-23 ENCOUNTER — Ambulatory Visit (INDEPENDENT_AMBULATORY_CARE_PROVIDER_SITE_OTHER): Payer: PPO | Admitting: Internal Medicine

## 2015-05-23 ENCOUNTER — Encounter: Payer: Self-pay | Admitting: Internal Medicine

## 2015-05-23 VITALS — BP 138/78 | HR 80 | Temp 97.7°F | Ht 63.0 in | Wt 161.2 lb

## 2015-05-23 DIAGNOSIS — J432 Centrilobular emphysema: Secondary | ICD-10-CM | POA: Diagnosis not present

## 2015-05-23 DIAGNOSIS — J449 Chronic obstructive pulmonary disease, unspecified: Secondary | ICD-10-CM

## 2015-05-23 MED ORDER — PREDNISONE 10 MG PO TABS: ORAL_TABLET | ORAL | Status: DC

## 2015-05-23 MED ORDER — AZITHROMYCIN 250 MG PO TABS: ORAL_TABLET | ORAL | Status: DC

## 2015-05-23 MED ORDER — TIOTROPIUM BROMIDE-OLODATEROL 2.5-2.5 MCG/ACT IN AERS: 2.0000 | INHALATION_SPRAY | Freq: Every day | RESPIRATORY_TRACT | Status: DC

## 2015-05-23 NOTE — Telephone Encounter (Signed)
lmtcb for Eduardo [name].  

## 2015-05-23 NOTE — Telephone Encounter (Signed)
Called and LM with [name]

## 2015-05-23 NOTE — Progress Notes (Signed)
Subjective:    Patient ID: Eduardo [name], male    DOB: 01/17/1931, 81 y.o.   MRN: [id number] HPI 81 yo man, former smoker, CAD s/p MI 1/12, A Fib, allergies, exertional SOB for a few yrs. This has been worse since recent hosp in 1/12 - was cathed x 2, no intervention made, course c/b renal failure. 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 No hypoxemia reported with exercise. Was sent from the hospital on metoprolol (new). He has heard wheezing, especially when nasal allergies. Arlyce Harman at Dr Alla German office suggested AFL/COPD.   ROV 09/22/10 -- f/u SOB due to presumed COPD, CAD/MI, A Fib. Last time we started prn SABA to see if he would benefit - may have helped him some. He had full PFT's (09/18/10) . Dr Tamala Julian changed his metoprolol to verapamil but no change in breathing; he did get LE edema. He continues to have wheeze, cough, DOE. Also currently having severe PND and feels that it is contributing to his trouble. Taking allegra qd.  ROV 11/15/10 -- follow up for multifactorial dyspnea, CAD, probable COPD, allergic rhinitis. Returns today after we started Symbicort at last visit. He didn't fel that the SymbicorContinues to do Rehab at [facility]. The therapists were concerned that he was having exertional dyspnea, having to stop during workouts. Over last few days more allergy sx, on fexafenadine.   ROV 12/15/10 -- probable COPD, CAD, allergic rhinitis. He didn't improve w Symbicort, so we started Spiriva last visit. He believes that his breathing is a bit better. He has done better at Rehab on eliptical and treadmill. No hypoxemia. He is taking both allegra and zyrtec for allergies, as well as nasonex - still has severe PND and associated cough.   ROB 01/29/11 -- probable COPD, CAD, allergic rhinitis. Last time we discussed change from nasal steroid to astelin + NSW + zyrtec. He did not make the change. He isn't having the same degree of cough, but still with nasal gtt. Remains on Spiriva and  doing well.   ROV 05/14/11 -- probable COPD, CAD, allergic rhinitis. Returns for f/u. Last time changed nasal steroid to astepro. Still has significant nasal gtt, wheeze in the am, clearing mucous from throat. Arlyce Harman 4/12 was not impressive for significant AFL.   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 Was recently hospitalized with acute on chronic anemia while on coumadin. Also some degree of diastolic CHF and pulmonary edema. Feels much stronger, breathing is better. Occult blood negative. Fe started during the hosp.   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 Also hx chronic anemia on Fe. Returns f/u. He reports that his last Hgb w Dr Tamala Julian was 66. He is able to exert, but has some DOE after a block. Remains on Spiriva, uses SABA prn a couple times a day.  Lots of mucous and sinus drainage - taking zyrtec, NSW, astepro. Doing NSW before bed and then getting drainage to throat.   ROV 11/07/12 -- COPD, CAD, allergic rhinitis, chronic anemia. He reports that he has been having more problems at night - feels a drainage, a globus sensation, wheezing, coughing. No real snoring. He tries to clear his throat but there is nothing there. He stopped doing nasal saline washes months ago, still on zyrtec, astepro. Dr [name] added singulair recently.   ROV 02/06/13 -- COPD, CAD, allergic rhinitis, chronic anemia. He is still having trouble with cough especially at night. Has been doing  nasal saline at night. He had a 6 minute walk 8/28, walked shorter distance than he did previously, feels more SOB, has neuropathic pain. He is on spiriva, he is using albuterol 2x a day. Nasal congestion - on astepro qhs  ROV 03/05/13 -- COPD, CAD, allergic rhinitis, chronic anemia. Last time he was having more dyspnea, more cough. We tried adding Breo to Spiriva to see if he would benefit > his dyspnea and wheezing are both better. We discussed increasing  astepro but he hasn't needed to. He changed NSW to qam from bedtime.   ROV 07/15/13 -- COPD, CAD, allergic rhinitis, chronic anemia on Breo and Spiriva. He was seen 2/2 by PCP with URI sx > lots of nasal gtt, cough non-productive, some low grade fever. He was started on doxy + prednisone. The prednisone has helped him some. He is still having drainage. Remains on singulair, zyrtec, astepro nasal spray. Not using [place] regularly.   ROV 01/01/14 -- COPD, CAD, allergic rhinitis, chronic anemia on Breo and Spiriva. He has been doing very well, especially since change to [facility]. He has been active, breathing is improved. He is doing well on his usual allergy regimen.   ROV 08/27/14 -- follow up visit for COPD and allergic rhinitis, cough. He tells me that he has continued to have a lot of mucous - uses NSW's regularly. He does build up mucous overnight but it doesn't wake him. He is congested in the am. He is on zyrtec, no longer on singulair.  We discussed pursed lip breathing and incentive spirometry today. He is on Breo and tolerates well. His activity level is pretty good but spinal stenosis does slow him down. He works with a Clinical research associate 2x a week. No exacerbations since last time.   ROV 10/25/14 -- follow-up visit for COPD (FEV1 1.03 L or 50% predicted, 09/2010), cough and allergic rhinitis. He reports today progressive dyspnea for last 3 months, happens when sleeping and sometimes when supine. His wife has also noted some exertional SOB, has to stop after 200 feet. He is on Breo, but we stopped spiriva. With these new sx he added back spiriva 4 days ago. He is still able to do his daily workouts, trainer 2x a week.  No wheeze or cough. His wife is interested in looking into getting a scooter for him.   ROV 03/11/15 -- follow up visit for severe COPD, rhinitis and chronic cough. 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 His cognitive abilities are  improved to baseline. Breathing has been good since last visit.  He usually has trouble with Fall allergies. He is on Zyrtec, astelin prn.  He is stable on breo and spiriva. He is able to walk indefinitely. Sometimes coughs. No wheezing.  rec Please continue your Breo and Spiriva as you are using them  Keep albuterol available to use if needed for shortness of breath or wheezing. Continue zyrtec daily Use astelin nasal spray as needed for drainage, especially this Fall Flu shot today.    05/23/2015  Acute ov/Jasraj Lappe re:  Chief Complaint  Patient presents with  . Acute Visit    Pt of Dr Agustina Caroli. Pt c/o decreased o2 sats, increased DOE, fever and congestion x 1 wk.    onset one week with sratchy throat / hacking cough/ 101.5 week prior to OV  / comfortable at rest p saba   No obvious day to day  or daytime variability or assoc   cp or chest tightness, subjective wheeze or overt sinus or hb symptoms. No unusual exp hx or h/o childhood pna/ asthma or knowledge of premature birth.  Sleeping ok without nocturnal  or early am exacerbation  of respiratory  c/o's or need for noct saba. Also denies any obvious fluctuation of symptoms with weather or environmental changes or other aggravating or alleviating factors except as outlined above   Current Medications, Allergies, Complete Past Medical History, Past Surgical History, Family History, and Social History were reviewed in [employer] record.  ROS  The following are not active complaints unless bolded sore throat, dysphagia, dental problems, itching, sneezing,  nasal congestion or excess/ purulent secretions, ear ache,   fever, chills, sweats, unintended wt loss, classically pleuritic or exertional cp, hemoptysis,  orthopnea pnd or leg swelling, presyncope, palpitations, abdominal pain, anorexia, nausea, vomiting, diarrhea  or change in bowel or bladder habits, change in stools or urine, dysuria,hematuria,  rash, arthralgias,  visual complaints, headache, numbness, weakness or ataxia or problems with walking or coordination,  change in mood/affect or memory.            Objective:  Physical Exam  Wt Readings from Last 3 Encounters:  05/23/15 161 lb 3.2 oz (73.12 kg)  05/18/15 157 lb (71.215 kg)  05/03/15 160 lb (72.576 kg)    Vital signs reviewed   amb severely hoarse wm nad   ENT: No lesions,  mouth clear,  oropharynx clear, no postnasal drip  Neck: No JVD, insp and exp pseudowheezing   Lungs: No use of accessory muscles, referred UA noises.   Cardiovascular: irregular, heart sounds normal, no murmur or gallops, no peripheral edema  Musculoskeletal: No deformities, no cyanosis or clubbing  Neuro: alert, non focal  Skin: Warm, no lesions or rashes   CXR PA and Lateral:   05/23/2015 :    I personally reviewed images and agree with radiology impression as follows:    Stable lingular scarring. No acute cardiopulmonary abnormality seen.     Assessment & Plan:

## 2015-05-23 NOTE — Telephone Encounter (Signed)
[phone] calling back

## 2015-05-23 NOTE — Patient Instructions (Addendum)
zpak  Prednisone 10 mg take  4 each am x 2 days,   2 each am x 2 days,  1 each am x 2 days and stop  Stop breo Stop spiriva   stiolto 2 puffs each am   Only use your albuterol (proair)as a rescue medication to be used if you can't catch your breath by resting or doing a relaxed purse lip breathing pattern.  - The less you use it, the better it will work when you need it. - Ok to use up to 2 puffs  every 4 hours if you must but call for immediate appointment if use goes up over your usual need - Don't leave home without it !!  (think of it like the spare tire for your car)   Work on inhaler technique:  relax and gently blow all the way out then take a nice smooth deep breath back in, triggering the inhaler at same time you start breathing in.  Hold for up to 5 seconds if you can. Blow out thru nose. Rinse and gargle with water when done    Please remember to go to the  x-ray department downstairs for your tests - we will call you with the results when they are available.  GERD (REFLUX)  is an extremely common cause of respiratory symptoms just like yours , many times with no obvious heartburn at all.    It can be treated with medication, but also with lifestyle changes including elevation of the head of your bed (ideally with 6 inch  bed blocks),  Smoking cessation, avoidance of late meals, excessive alcohol, and avoid fatty foods, chocolate, peppermint, colas, red wine, and acidic juices such as orange juice.  NO MINT OR MENTHOL PRODUCTS SO NO COUGH DROPS  USE SUGARLESS CANDY INSTEAD (Jolley ranchers or Stover's or Life Savers) or even ice chips will also do - the key is to swallow to prevent all throat clearing. NO OIL BASED VITAMINS - use powdered substitutes.  Use mucinex dm up to 1200 every 12 hours as needed for cough    Please schedule a follow up office visit in 2 weeks, sooner if needed to see Tammy, Byrum or me for recheck

## 2015-05-24 NOTE — Telephone Encounter (Signed)
Left message for Tina to call back. 

## 2015-05-24 NOTE — Progress Notes (Signed)
Quick Note:  LMTCB ______ 

## 2015-05-24 NOTE — Telephone Encounter (Signed)
Eduardo [name] called back for chest x-ray results.

## 2015-05-25 ENCOUNTER — Encounter: Payer: Self-pay | Admitting: Internal Medicine

## 2015-05-25 DIAGNOSIS — J449 Chronic obstructive pulmonary disease, unspecified: Secondary | ICD-10-CM | POA: Insufficient documentation

## 2015-05-25 NOTE — Assessment & Plan Note (Addendum)
PFT's  09/18/10    FEV1 1.20 (58 % ) ratio 59  p 17 % improvement from saba with DLCO  56 % corrects to 102 % for alv volume  - 05/23/2015  extensive coaching HFA effectiveness =    90% > try off dpi's and just use stiolto 2 each am   Symptoms are markedly disproportionate to objective findings and not clear this is all a lung problem but pt does appear to have difficult airway management issues. DDX of  difficult airways management all start with A and  include Adherence, Ace Inhibitors, Acid Reflux, Active Sinus Disease, Alpha 1 Antitripsin deficiency, Anxiety masquerading as Airways dz,  ABPA,  allergy(esp in young), Aspiration (esp in elderly), Adverse effects of meds,  Active smokers, A bunch of PE's (a small clot burden can't cause this syndrome unless there is already severe underlying pulm or vascular dz with poor reserve) plus two Bs  = Bronchiectasis and Beta blocker use..and one C= CHF  Adherence is always the initial "prime suspect" and is a multilayered concern that requires a "trust but verify" approach in every patient - starting with knowing how to use medications, especially inhalers, correctly, keeping up with refills and understanding the fundamental difference between maintenance and prns vs those medications only taken for a very short course and then stopped and not refilled.  - The proper method of use, as well as anticipated side effects, of a metered-dose inhaler are discussed and demonstrated to the patient. Improved effectiveness after extensive coaching during this visit to a level of approximately 90 % from a baseline of 50 % > try respimat stiolto   ? Adverse effects of dpis > try respimat  ? Acute sinusitis/ rhinitis > doubt so  zpak only for now  ? Allergy/asthmatic component > also doubt but cover short term with Prednisone 10 mg take  4 each am x 2 days,   2 each am x 2 days,  1 each am x 2 days and stop and f/u in 2 weeks -obviously if he continues to exacerbate off  dpi's then an inhaled steroid needs to be considered again or treatment directed at reflux, which can cause some of the same symptoms that don't bother him chronically, mainly sensation of postnasal drainage and chronic hoarseness.   I had an extended discussion with the patient and wife  reviewing all relevant studies completed to date and  lasting 15 to 20 minutes of a 25 minute visit    Each maintenance medication was reviewed in detail including most importantly the difference between maintenance and prns and under what circumstances the prns are to be triggered using an action plan format that is not reflected in the computer generated alphabetically organized AVS.    Please see instructions for details which were reviewed in writing and the patient given a copy highlighting the part that I personally wrote and discussed at today's ov.

## 2015-05-25 NOTE — Progress Notes (Signed)
Quick Note:  LMTCB ______ 

## 2015-05-26 ENCOUNTER — Telehealth: Payer: Self-pay | Admitting: Internal Medicine

## 2015-05-26 NOTE — Telephone Encounter (Signed)
LMTCB

## 2015-05-26 NOTE — Telephone Encounter (Signed)
Result Note     Call pt: Reviewed cxr and no acute change so no change in recommendations made at ov  --  lmomtcb x1 

## 2015-05-26 NOTE — Telephone Encounter (Signed)
Left Message to make Appointment.  Eduardo [name]

## 2015-05-27 NOTE — Telephone Encounter (Signed)
lmtcb x2 for pt. 

## 2015-05-27 NOTE — Telephone Encounter (Signed)
Patient Returned call  [phone]

## 2015-05-27 NOTE — Telephone Encounter (Signed)
LMTCB for pt 

## 2015-05-27 NOTE — Telephone Encounter (Signed)
Results have been explained to patient, pt expressed understanding. Nothing further needed.  

## 2015-06-03 ENCOUNTER — Telehealth: Payer: Self-pay | Admitting: Internal Medicine

## 2015-06-03 NOTE — Telephone Encounter (Signed)
I do not see anything in the chart showing that we tried to call patient. Attempted to call back, Left message for patient to call back.

## 2015-06-07 NOTE — Telephone Encounter (Signed)
lmtcb x2 for pt. 

## 2015-06-08 NOTE — Telephone Encounter (Signed)
LMTCB x 3 Will close encounter, no record of our office contacting the patient. Left Detailed message stating this and that if the patient has anything further to discuss to call back and a new phone note will be created.

## 2015-06-10 ENCOUNTER — Ambulatory Visit (INDEPENDENT_AMBULATORY_CARE_PROVIDER_SITE_OTHER): Payer: PPO | Admitting: Internal Medicine

## 2015-06-10 ENCOUNTER — Encounter: Payer: Self-pay | Admitting: Internal Medicine

## 2015-06-10 VITALS — BP 136/84 | HR 80 | Ht 63.0 in | Wt 157.0 lb

## 2015-06-10 DIAGNOSIS — J449 Chronic obstructive pulmonary disease, unspecified: Secondary | ICD-10-CM

## 2015-06-10 MED ORDER — TIOTROPIUM BROMIDE-OLODATEROL 2.5-2.5 MCG/ACT IN AERS: 2.0000 | INHALATION_SPRAY | Freq: Every day | RESPIRATORY_TRACT | Status: DC

## 2015-06-10 NOTE — Progress Notes (Signed)
Subjective:    Patient ID: Eduardo [name], male    DOB: 1930-07-04, 81 y.o.   MRN: [id number] HPI 81 yo man, former smoker, CAD s/p MI 1/12, A Fib, allergies, exertional SOB for a few yrs. This has been worse since recent hosp in 1/12 - was cathed x 2, no intervention made, course c/b renal failure. 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 No hypoxemia reported with exercise. Was sent from the hospital on metoprolol (new). He has heard wheezing, especially when nasal allergies. Arlyce Harman at Dr Alla German office suggested AFL/COPD.   ROV 09/22/10 -- f/u SOB due to presumed COPD, CAD/MI, A Fib. Last time we started prn SABA to see if he would benefit - may have helped him some. He had full PFT's (09/18/10) . Dr Tamala Julian changed his metoprolol to verapamil but no change in breathing; he did get LE edema. He continues to have wheeze, cough, DOE. Also currently having severe PND and feels that it is contributing to his trouble. Taking allegra qd.  ROV 11/15/10 -- follow up for multifactorial dyspnea, CAD, probable COPD, allergic rhinitis. Returns today after we started Symbicort at last visit. He didn't fel that the SymbicorContinues to do Rehab at [place]. The therapists were concerned that he was having exertional dyspnea, having to stop during workouts. Over last few days more allergy sx, on fexafenadine.   ROV 12/15/10 -- probable COPD, CAD, allergic rhinitis. He didn't improve w Symbicort, so we started Spiriva last visit. He believes that his breathing is a bit better. He has done better at Rehab on eliptical and treadmill. No hypoxemia. He is taking both allegra and zyrtec for allergies, as well as nasonex - still has severe PND and associated cough.   ROB 01/29/11 -- probable COPD, CAD, allergic rhinitis. Last time we discussed change from nasal steroid to astelin + NSW + zyrtec. He did not make the change. He isn't having the same degree of cough, but still with nasal gtt. Remains on Spiriva and  doing well.   ROV 05/14/11 -- probable COPD, CAD, allergic rhinitis. Returns for f/u. Last time changed nasal steroid to astepro. Still has significant nasal gtt, wheeze in the am, clearing mucous from throat. Arlyce Harman 4/12 was not impressive for significant AFL.   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 Was recently hospitalized with acute on chronic anemia while on coumadin. Also some degree of diastolic CHF and pulmonary edema. Feels much stronger, breathing is better. Occult blood negative. Fe started during the hosp.   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 Also hx chronic anemia on Fe. Returns f/u. He reports that his last Hgb w Dr Tamala Julian was 41. He is able to exert, but has some DOE after a block. Remains on Spiriva, uses SABA prn a couple times a day.  Lots of mucous and sinus drainage - taking zyrtec, NSW, astepro. Doing NSW before bed and then getting drainage to throat.   ROV 11/07/12 -- COPD, CAD, allergic rhinitis, chronic anemia. He reports that he has been having more problems at night - feels a drainage, a globus sensation, wheezing, coughing. No real snoring. He tries to clear his throat but there is nothing there. He stopped doing nasal saline washes months ago, still on zyrtec, astepro. Dr [name] added singulair recently.   ROV 02/06/13 -- COPD, CAD, allergic rhinitis, chronic anemia. He is still having trouble with cough especially at night. Has been doing  nasal saline at night. He had a 6 minute walk 8/28, walked shorter distance than he did previously, feels more SOB, has neuropathic pain. He is on spiriva, he is using albuterol 2x a day. Nasal congestion - on astepro qhs  ROV 03/05/13 -- COPD, CAD, allergic rhinitis, chronic anemia. Last time he was having more dyspnea, more cough. We tried adding Breo to Spiriva to see if he would benefit > his dyspnea and wheezing are both better. We discussed increasing  astepro but he hasn't needed to. He changed NSW to qam from bedtime.   ROV 07/15/13 -- COPD, CAD, allergic rhinitis, chronic anemia on Breo and Spiriva. He was seen 2/2 by PCP with URI sx > lots of nasal gtt, cough non-productive, some low grade fever. He was started on doxy + prednisone. The prednisone has helped him some. He is still having drainage. Remains on singulair, zyrtec, astepro nasal spray. Not using Tyro regularly.   ROV 01/01/14 -- COPD, CAD, allergic rhinitis, chronic anemia on Breo and Spiriva. He has been doing very well, especially since change to [facility]. He has been active, breathing is improved. He is doing well on his usual allergy regimen.   ROV 08/27/14 -- follow up visit for COPD and allergic rhinitis, cough. He tells me that he has continued to have a lot of mucous - uses NSW's regularly. He does build up mucous overnight but it doesn't wake him. He is congested in the am. He is on zyrtec, no longer on singulair.  We discussed pursed lip breathing and incentive spirometry today. He is on Breo and tolerates well. His activity level is pretty good but spinal stenosis does slow him down. He works with a Clinical research associate 2x a week. No exacerbations since last time.   ROV 10/25/14 -- follow-up visit for COPD (FEV1 1.03 L or 50% predicted, 09/2010), cough and allergic rhinitis. He reports today progressive dyspnea for last 3 months, happens when sleeping and sometimes when supine. His wife has also noted some exertional SOB, has to stop after 200 feet. He is on Breo, but we stopped spiriva. With these new sx he added back spiriva 4 days ago. He is still able to do his daily workouts, trainer 2x a week.  No wheeze or cough. His wife is interested in looking into getting a scooter for him.   ROV 03/11/15 -- follow up visit for severe COPD, rhinitis and chronic cough. 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 His cognitive abilities are  improved to baseline. Breathing has been good since last visit.  He usually has trouble with Fall allergies. He is on Zyrtec, astelin prn.  He is stable on breo and spiriva. He is able to walk indefinitely. Sometimes coughs. No wheezing.  rec Please continue your Breo and Spiriva as you are using them  Keep albuterol available to use if needed for shortness of breath or wheezing. Continue zyrtec daily Use astelin nasal spray as needed for drainage, especially this Fall Flu shot today.    05/23/2015  Acute ov/Wert re: severe copd Chief Complaint  Patient presents with  . Acute Visit    Pt of Dr Agustina Caroli. Pt c/o decreased o2 sats, increased DOE, fever and congestion x 1 wk.    onset one week with sratchy throat / hacking cough/ 101.5 week prior to OV  / comfortable at rest p saba  rec zpak  Prednisone 10  mg take  4 each am x 2 days,   2 each am x 2 days,  1 each am x 2 days and stop  Stop breo Stop spiriva  stiolto 2 puffs each am  Only use your albuterol (proair)as a rescue medication Work on inhaler technique:  GERD diet  Use mucinex dm up to 1200 every 12 hours as needed for cough   Please schedule a follow up office visit in 2 weeks, sooner if needed to see Tammy, Byrum or me for recheck         06/10/2015  f/u ov/Wert re: copd GOLD II on stiolto and rare need for saba  / no longer carries with him  Chief Complaint  Patient presents with  . Follow-up    breathing is doing better.  No concerns. Refill Stiolto.  not sure he's really better on stiolto vs spiriva and has lots of spiriva at home he hasn't used   No obvious day to day or daytime variability or assoc  excess/ purulent sputum or mucus plugs   cp or chest tightness, subjective wheeze or overt sinus or hb symptoms. No unusual exp hx or h/o childhood pna/ asthma or knowledge of premature birth.  Sleeping ok without nocturnal  or early am exacerbation  of respiratory  c/o's or need for noct saba. Also denies any obvious  fluctuation of symptoms with weather or environmental changes or other aggravating or alleviating factors except as outlined above   Current Medications, Allergies, Complete Past Medical History, Past Surgical History, Family History, and Social History were reviewed in [employer] record.  ROS  The following are not active complaints unless bolded sore throat, dysphagia, dental problems, itching, sneezing,  nasal congestion or excess/ purulent secretions, ear ache,   fever, chills, sweats, unintended wt loss, classically pleuritic or exertional cp, hemoptysis,  orthopnea pnd or leg swelling, presyncope, palpitations, abdominal pain, anorexia, nausea, vomiting, diarrhea  or change in bowel or bladder habits, change in stools or urine, dysuria,hematuria,  rash, arthralgias, visual complaints, headache, numbness, weakness or ataxia or problems with walking or coordination,  change in mood/affect or memory.            Objective:  Physical Exam  06/10/2015      161   05/23/15 161 lb 3.2 oz (73.12 kg)  05/18/15 157 lb (71.215 kg)  05/03/15 160 lb (72.576 kg)    Vital signs reviewed   amb still a little  hoarse wm nad   ENT: No lesions,  mouth clear,  oropharynx clear, no postnasal drip  Neck: No JVD,  Min ronchi bilaterally / mostly pseudowheeze    Lungs: No use of accessory muscles, referred UA noises.   Cardiovascular: irregular, heart sounds normal, no murmur or gallops, no peripheral edema  Musculoskeletal: No deformities, no cyanosis or clubbing  Neuro: alert, non focal  Skin: Warm, no lesions or rashes   CXR PA and Lateral:   05/23/2015 :    I personally reviewed images and agree with radiology impression as follows:    Stable lingular scarring. No acute cardiopulmonary abnormality seen.     Assessment & Plan:

## 2015-06-10 NOTE — Patient Instructions (Addendum)
Ok to change back to spiriva and see if you note any difference and if it's significant then resume the stiolto   Keep your rescue inhaler on hand  At all times

## 2015-06-15 NOTE — Assessment & Plan Note (Signed)
PFT's  09/18/10    FEV1 1.20 (58 % ) ratio 59  p 17 % improvement from saba with DLCO  56 % corrects to 102 % for alv volume  - 05/23/2015  extensive coaching HFA effectiveness =    90% > try off dpi's and just use stiolto 2 each am   Clearly improved on stiolto but wants to try to use up his spiriva first. Since not clear his improvement was really due to stiolto vs stopping the two dpi's (which may have irritated his upper airway enough to imitate aecopd) fine with me to resume just the spiriva to see to what extent this reduces his ex tol or increase need for saba and if either is a problem resume rx with stiolto  I had an extended discussion with the patient reviewing all relevant studies completed to date and  lasting 15 to 20 minutes of a 25 minute visit    Each maintenance medication was reviewed in detail including most importantly the difference between maintenance and prns and under what circumstances the prns are to be triggered using an action plan format that is not reflected in the computer generated alphabetically organized AVS.    Please see instructions for details which were reviewed in writing and the patient given a copy highlighting the part that I personally wrote and discussed at today's ov.

## 2015-06-16 ENCOUNTER — Other Ambulatory Visit: Payer: Self-pay | Admitting: Cardiovascular Disease

## 2015-06-16 ENCOUNTER — Other Ambulatory Visit: Payer: Self-pay | Admitting: Interventional Cardiology

## 2015-06-16 MED ORDER — FENOFIBRATE 160 MG PO TABS: 160.0000 mg | ORAL_TABLET | Freq: Every day | ORAL | Status: DC

## 2015-06-16 NOTE — Telephone Encounter (Signed)
Pt calling requesting refill on fenofibrate, 90 day supply, sent to [facility]. Please advise

## 2015-06-16 NOTE — Telephone Encounter (Signed)
Refill sent for Fenofibrate 90 day supply to [facility].

## 2015-06-22 ENCOUNTER — Encounter: Payer: Self-pay | Admitting: Neurology

## 2015-06-22 ENCOUNTER — Ambulatory Visit (INDEPENDENT_AMBULATORY_CARE_PROVIDER_SITE_OTHER): Payer: PPO | Admitting: Neurology

## 2015-06-22 VITALS — BP 133/61 | HR 64 | Ht 63.0 in | Wt 157.6 lb

## 2015-06-22 DIAGNOSIS — Z7901 Long term (current) use of anticoagulants: Secondary | ICD-10-CM

## 2015-06-22 DIAGNOSIS — E785 Hyperlipidemia, unspecified: Secondary | ICD-10-CM | POA: Diagnosis not present

## 2015-06-22 DIAGNOSIS — I482 Chronic atrial fibrillation, unspecified: Secondary | ICD-10-CM

## 2015-06-22 DIAGNOSIS — I10 Essential (primary) hypertension: Secondary | ICD-10-CM | POA: Diagnosis not present

## 2015-06-22 DIAGNOSIS — I63132 Cerebral infarction due to embolism of left carotid artery: Secondary | ICD-10-CM

## 2015-06-22 NOTE — Progress Notes (Signed)
STROKE NEUROLOGY FOLLOW UP NOTE  NAME: Eduardo [name] DOB: 1931-05-06  REASON FOR VISIT: stroke follow up HISTORY FROM: Patient, wife and chart  Today we had the pleasure of seeing Eduardo [name] in follow-up at our Neurology Clinic. Pt was accompanied by wife.   History Summary Eduardo [name] is a 82 y.o. male with history of atrial fibrillation not on anticoagulation, previous MI, hypertension, COPD, and hyperlipidemia was admitted on 12/30/2014 for right hand weakness. He did not receive IV t-PA due to mild deficits. MRI showed left frontal and left occipital small infarct, embolic pattern, likely due to A. fib not on anticoagulation. Stroke workup including MRA, carotid Doppler, 2-D echo, were unremarkable. Lipid panel found patient to have high triglycerides at 801, LDL not able to calculate, A1c 5.8. He was put on eliquis and increased Lipitor to 20 mg for stroke prevention, continued on fenofibrate for high TG. He was discharged with outpatient PT OT.  03/01/15 follow up -  the patient has been doing well.  No recurrent strokelike symptoms. He has been working with PT OT for right hand weakness, and muscle strengths much improved. He has no side effect from an increase, he is also on aspirin 81 due to previous MI. Blood pressure 119/61 today in clinic. Wife asked for information about PCSK9 inhibitors.   Interval History During the interval time, the pt has been doing well. No stroke like symptoms. Right hand weakness further improved and now handwriting is much better. No complains. Follows with cardiology for afib and CAD, continued on eliquis and ASA 81 without side effects. They have talked with PCP about PCSK9 inhibitors but would like to hold off on it at this time. BP today 133/61.  REVIEW OF SYSTEMS: Full 14 system review of systems performed and notable only for those listed below and in HPI above, all others are negative:  Constitutional:   Cardiovascular:    Ear/Nose/Throat:   Skin:  Eyes:   Respiratory:   Gastroitestinal:   Genitourinary:  Hematology/Lymphatic:  Bruise easily Endocrine:  Musculoskeletal:   Allergy/Immunology:   Neurological:   Psychiatric:  Sleep:   The following represents the patient's updated allergies and side effects list: Allergies  Allergen Reactions  . Penicillins Itching  . Sulfonamide Derivatives Other (See Comments)    "it affected my kidneys"    The neurologically relevant items on the patient's problem list were reviewed on today's visit.  Neurologic Examination  A problem focused neurological exam (12 or more points of the single system neurologic examination, vital signs counts as 1 point, cranial nerves count for 8 points) was performed.  Blood pressure 133/61, pulse 64, height 5\' 3"  (1.6 m), weight 157 lb 9.6 oz (71.487 kg).  General - Well nourished, well developed, in no apparent distress.  Ophthalmologic - Sharp disc margins OU.  Cardiovascular - irregularly irregular heart rate and rhythm.  Mental Status -  Level of arousal and orientation to time, place, and person were intact. Language including expression, naming, repetition, comprehension was assessed and found intact. Fund of Knowledge was assessed and was intact.  Cranial Nerves II - XII - II - Visual field intact OU. III, IV, VI - Extraocular movements intact. V - Facial sensation intact bilaterally. VII - Facial movement intact bilaterally. VIII - Hearing & vestibular intact bilaterally. X - Palate elevates symmetrically. XI - Chin turning & shoulder shrug intact bilaterally. XII - Tongue protrusion intact.  Motor Strength - The patient's strength was normal  in all extremities and pronator drift was absent.  Bulk was normal and fasciculations were absent.   Motor Tone - Muscle tone was assessed at the neck and appendages and was normal.  Reflexes - The patient's reflexes were 1+ in all extremities and he had no  pathological reflexes.  Sensory - Light touch, temperature/pinprick were assessed and were normal.    Coordination - The patient had normal movements in the hands and feet with no ataxia or dysmetria.  Tremor was absent.  Gait and Station - walk with cane, stable, no tendency to fall.  Data reviewed: I personally reviewed the images and agree with the radiology interpretations.  2-D echocardiogram 12/31/2014 Study Conclusions - Left ventricle: The cavity size was normal. There was severe concentric hypertrophy. Systolic function was normal. The estimated ejection fraction was in the range of 60% to 65%. Wall motion was normal; there were no regional wall motion abnormalities. The study was not technically sufficient to allow evaluation of LV diastolic dysfunction due to atrial fibrillation. - Aortic valve: Trileaflet; moderately thickened, severely calcified leaflets with fixed left coronary cusp. Valve mobility was restricted. There was mild stenosis. There was no regurgitation. Mean gradient (S): 9 mm Hg. Peak gradient (S): 19 mm Hg. Valve area (VTI): 2.07 cm^2. Valve area (Vmax): 1.58 cm^2. Valve area (Vmean): 1.75 cm^2. - Aortic root: The aortic root was normal in size. - Ascending aorta: The ascending aorta was normal in size. - Mitral valve: Calcified annulus. Moderately thickened, moderately calcified leaflets . Mobility was restricted. The findings are consistent with mild stenosis. There was mild regurgitation. Valve area by continuity equation (using LVOT flow): 2.59 cm^2. - Left atrium: The atrium was severely dilated. - Right ventricle: Systolic function was normal. - Right atrium: The atrium was mildly dilated. - Tricuspid valve: There was mild regurgitation. - Pulmonary arteries: Systolic pressure was within the normal range. PA peak pressure: 34 mm Hg (S). - Pericardium, extracardiac: There was no pericardial effusion.  CUS -  Bilateral: 1-39% ICA stenosis. Vertebral artery flow is antegrade.  Ct Head Wo Contrast 12/31/2014  No acute intracranial pathology. Age-related atrophy and chronic microvascular ischemic disease. If symptoms persist and there are no contraindications, MRI may provide better evaluation if clinically indicated.   Mri and Mra Head Wo Contrast 12/31/2014   MRI HEAD  1. Small acute ischemic nonhemorrhagic 10 x 9 x 12 mm cortical infarct involving the posterior left frontal lobe in the left precentral gyrus.  2. Additional tiny acute ischemic nonhemorrhagic 5 mm cortical infarct within the left occipital lobe.  3. Advanced age-related cerebral atrophy with mild chronic microvascular ischemic disease.   MRA HEAD  1. No proximal or large vessel occlusion identified within the intracranial circulation.  2. No correctable or focal hemosiderin and likely significant stenosis identified.  3. Diminutive left A2 segment with moderate to advanced multi focal atheromatous irregularity.  4. Distal branch atheromatous irregularity within the MCA branches bilaterally.    Assessment: As you may recall, he is a 82 y.o. Caucasian male with PMH of afib not on anticoagulation, previous MI, hypertension, COPD, and hyperlipidemia was admitted on 12/30/2014 for right hand weakness. MRI showed left frontal and left occipital small infarct, embolic pattern, likely due to A. fib not on anticoagulation. Stroke workup including MRA, carotid Doppler, 2-D echo, were unremarkable. Lipid panel found patient to have high triglycerides at 801, LDL not able to calculate, A1c 5.8. He was put on eliquis and increased Lipitor to 20 mg for  stroke prevention, continued on fenofibrate for high TG. Continued ASA 81 for hx of CAD and NSTEMI. He was discharged with outpatient PT OT. During the interval time, he is doing well, right hand weakness has resolved.  Plan:  - continue eliquis and baby ASA and lipitor for stroke  and cardiac prevention - continue fenofibrate and lipitor for high TG and LDL. May consider PCSK9 inhibitors if needed. - Follow up with your primary care physician for stroke risk factor modification. Recommend maintain blood pressure goal around 130/80, diabetes with hemoglobin A1c goal below 6.5% and lipids with LDL cholesterol goal below 70 mg/dL.  - continue follow up with cardiology for A. fib management - check BP at home - follow up as needed.   No orders of the defined types were placed in this encounter.    No orders of the defined types were placed in this encounter.    Patient Instructions  - continue eliquis and baby ASA and lipitor for stroke and cardiac prevention - continue fenofibrate and lipitor for high TG and LDL. May consider PCSK9 inhibitors if needed. - Follow up with your primary care physician for stroke risk factor modification. Recommend maintain blood pressure goal around 130/80, diabetes with hemoglobin A1c goal below 6.5% and lipids with LDL cholesterol goal below 70 mg/dL.  - continue follow up with cardiology for A. fib management - check BP at home - follow up as needed.    Rosalin Hawking, MD PhD [facility] [address], [place], [place] [address] [phone]

## 2015-06-22 NOTE — Patient Instructions (Signed)
-   continue eliquis and baby ASA and lipitor for stroke and cardiac prevention - continue fenofibrate and lipitor for high TG and LDL. May consider PCSK9 inhibitors if needed. - Follow up with your primary care physician for stroke risk factor modification. Recommend maintain blood pressure goal around 130/80, diabetes with hemoglobin A1c goal below 6.5% and lipids with LDL cholesterol goal below 70 mg/dL.  - continue follow up with cardiology for A. fib management - check BP at home - follow up as needed.

## 2015-07-07 DIAGNOSIS — F411 Generalized anxiety disorder: Secondary | ICD-10-CM | POA: Diagnosis not present

## 2015-07-07 DIAGNOSIS — F39 Unspecified mood [affective] disorder: Secondary | ICD-10-CM | POA: Diagnosis not present

## 2015-08-02 ENCOUNTER — Other Ambulatory Visit: Payer: Self-pay

## 2015-08-02 MED ORDER — APIXABAN 5 MG PO TABS: 5.0000 mg | ORAL_TABLET | Freq: Two times a day (BID) | ORAL | Status: DC

## 2015-08-02 NOTE — Telephone Encounter (Signed)
90 day supply

## 2015-08-10 DIAGNOSIS — L308 Other specified dermatitis: Secondary | ICD-10-CM | POA: Diagnosis not present

## 2015-08-10 DIAGNOSIS — K922 Gastrointestinal hemorrhage, unspecified: Secondary | ICD-10-CM

## 2015-08-10 DIAGNOSIS — K31811 Angiodysplasia of stomach and duodenum with bleeding: Secondary | ICD-10-CM

## 2015-08-10 DIAGNOSIS — B078 Other viral warts: Secondary | ICD-10-CM | POA: Diagnosis not present

## 2015-08-10 HISTORY — DX: Gastrointestinal hemorrhage, unspecified: K92.2

## 2015-08-10 HISTORY — DX: Angiodysplasia of stomach and duodenum with bleeding: K31.811

## 2015-08-14 ENCOUNTER — Telehealth: Payer: Self-pay | Admitting: Physician Assistant

## 2015-08-14 ENCOUNTER — Emergency Department (HOSPITAL_COMMUNITY): Payer: PPO

## 2015-08-14 ENCOUNTER — Encounter (HOSPITAL_COMMUNITY): Payer: Self-pay

## 2015-08-14 ENCOUNTER — Inpatient Hospital Stay (HOSPITAL_COMMUNITY)
Admission: EM | Admit: 2015-08-14 | Discharge: 2015-08-18 | DRG: 378 | Disposition: A | Discharge status: Home / Self Care | Payer: PPO | Attending: Internal Medicine | Admitting: Internal Medicine

## 2015-08-14 DIAGNOSIS — E785 Hyperlipidemia, unspecified: Secondary | ICD-10-CM | POA: Diagnosis present

## 2015-08-14 DIAGNOSIS — Z8673 Personal history of transient ischemic attack (TIA), and cerebral infarction without residual deficits: Secondary | ICD-10-CM

## 2015-08-14 DIAGNOSIS — I110 Hypertensive heart disease with heart failure: Secondary | ICD-10-CM | POA: Diagnosis not present

## 2015-08-14 DIAGNOSIS — I252 Old myocardial infarction: Secondary | ICD-10-CM

## 2015-08-14 DIAGNOSIS — I482 Chronic atrial fibrillation, unspecified: Secondary | ICD-10-CM | POA: Diagnosis present

## 2015-08-14 DIAGNOSIS — Z87891 Personal history of nicotine dependence: Secondary | ICD-10-CM | POA: Diagnosis not present

## 2015-08-14 DIAGNOSIS — K921 Melena: Secondary | ICD-10-CM | POA: Diagnosis present

## 2015-08-14 DIAGNOSIS — I10 Essential (primary) hypertension: Secondary | ICD-10-CM | POA: Diagnosis present

## 2015-08-14 DIAGNOSIS — I25119 Atherosclerotic heart disease of native coronary artery with unspecified angina pectoris: Secondary | ICD-10-CM | POA: Diagnosis not present

## 2015-08-14 DIAGNOSIS — R079 Chest pain, unspecified: Secondary | ICD-10-CM | POA: Diagnosis present

## 2015-08-14 DIAGNOSIS — J439 Emphysema, unspecified: Secondary | ICD-10-CM | POA: Diagnosis not present

## 2015-08-14 DIAGNOSIS — Z7982 Long term (current) use of aspirin: Secondary | ICD-10-CM | POA: Diagnosis not present

## 2015-08-14 DIAGNOSIS — I4891 Unspecified atrial fibrillation: Secondary | ICD-10-CM | POA: Diagnosis present

## 2015-08-14 DIAGNOSIS — I5032 Chronic diastolic (congestive) heart failure: Secondary | ICD-10-CM | POA: Diagnosis present

## 2015-08-14 DIAGNOSIS — D62 Acute posthemorrhagic anemia: Secondary | ICD-10-CM | POA: Diagnosis not present

## 2015-08-14 DIAGNOSIS — K31811 Angiodysplasia of stomach and duodenum with bleeding: Principal | ICD-10-CM | POA: Diagnosis present

## 2015-08-14 DIAGNOSIS — Z7901 Long term (current) use of anticoagulants: Secondary | ICD-10-CM

## 2015-08-14 DIAGNOSIS — I5043 Acute on chronic combined systolic (congestive) and diastolic (congestive) heart failure: Secondary | ICD-10-CM | POA: Diagnosis present

## 2015-08-14 DIAGNOSIS — F419 Anxiety disorder, unspecified: Secondary | ICD-10-CM | POA: Diagnosis present

## 2015-08-14 DIAGNOSIS — R0602 Shortness of breath: Secondary | ICD-10-CM | POA: Diagnosis not present

## 2015-08-14 DIAGNOSIS — R0789 Other chest pain: Secondary | ICD-10-CM | POA: Diagnosis not present

## 2015-08-14 HISTORY — DX: Angiodysplasia of stomach and duodenum with bleeding: K31.811

## 2015-08-14 HISTORY — DX: Polyneuropathy, unspecified: G62.9

## 2015-08-14 HISTORY — DX: Gastrointestinal hemorrhage, unspecified: K92.2

## 2015-08-14 LAB — CBC
HCT: 19.8 % — ABNORMAL LOW (ref 39.0–52.0)
HCT: 24.1 % — ABNORMAL LOW (ref 39.0–52.0)
Hemoglobin: 6.5 g/dL — CL (ref 13.0–17.0)
Hemoglobin: 8 g/dL — ABNORMAL LOW (ref 13.0–17.0)
MCH: 29.8 pg (ref 26.0–34.0)
MCH: 29.3 pg (ref 26.0–34.0)
MCHC: 32.8 g/dL (ref 30.0–36.0)
MCHC: 33.2 g/dL (ref 30.0–36.0)
MCV: 90.8 fL (ref 78.0–100.0)
MCV: 88.3 fL (ref 78.0–100.0)
PLATELETS: 203 10*3/uL (ref 150–400)
Platelets: 245 10*3/uL (ref 150–400)
RBC: 2.18 MIL/uL — ABNORMAL LOW (ref 4.22–5.81)
RBC: 2.73 MIL/uL — AB (ref 4.22–5.81)
RDW: 16.2 % — ABNORMAL HIGH (ref 11.5–15.5)
RDW: 18.3 % — ABNORMAL HIGH (ref 11.5–15.5)
WBC: 11.3 10*3/uL — ABNORMAL HIGH (ref 4.0–10.5)
WBC: 9.9 10*3/uL (ref 4.0–10.5)

## 2015-08-14 LAB — PREPARE RBC (CROSSMATCH)

## 2015-08-14 LAB — I-STAT TROPONIN, ED: Troponin i, poc: 0.45 ng/mL (ref 0.00–0.08)

## 2015-08-14 LAB — PROTIME-INR
INR: 1.62 — ABNORMAL HIGH (ref 0.00–1.49)
PROTHROMBIN TIME: 19.3 s — AB (ref 11.6–15.2)

## 2015-08-14 LAB — BASIC METABOLIC PANEL
Anion gap: 14 (ref 5–15)
BUN: 45 mg/dL — ABNORMAL HIGH (ref 6–20)
CO2: 21 mmol/L — ABNORMAL LOW (ref 22–32)
Calcium: 9.6 mg/dL (ref 8.9–10.3)
Chloride: 104 mmol/L (ref 101–111)
Creatinine, Ser: 1.13 mg/dL (ref 0.61–1.24)
GFR calc Af Amer: >60 mL/min (ref >60)
GFR calc non Af Amer: 59 mL/min — ABNORMAL LOW (ref >60)
Glucose, Bld: 100 mg/dL — ABNORMAL HIGH (ref 65–99)
Potassium: 4.2 mmol/L (ref 3.5–5.1)
Sodium: 139 mmol/L (ref 135–145)

## 2015-08-14 LAB — HEPATIC FUNCTION PANEL
ALBUMIN: 3.5 g/dL (ref 3.5–5.0)
ALT: 17 U/L (ref 17–63)
AST: 30 U/L (ref 15–41)
Alkaline Phosphatase: 31 U/L — ABNORMAL LOW (ref 38–126)
BILIRUBIN DIRECT: 0.1 mg/dL (ref 0.1–0.5)
BILIRUBIN TOTAL: 1 mg/dL (ref 0.3–1.2)
Indirect Bilirubin: 0.9 mg/dL (ref 0.3–0.9)
Total Protein: 5.9 g/dL — ABNORMAL LOW (ref 6.5–8.1)

## 2015-08-14 LAB — TROPONIN I: TROPONIN I: 0.59 ng/mL — AB (ref <0.031)

## 2015-08-14 LAB — MRSA PCR SCREENING: MRSA BY PCR: NEGATIVE

## 2015-08-14 MED ORDER — SODIUM CHLORIDE 0.9 % IV SOLN
10.0000 mL/h | Freq: Once | INTRAVENOUS | Status: AC
  Administered 2015-08-14: 10 mL/h via INTRAVENOUS

## 2015-08-14 MED ORDER — FLUTICASONE FUROATE-VILANTEROL 100-25 MCG/INH IN AEPB
1.0000 | INHALATION_SPRAY | Freq: Every day | RESPIRATORY_TRACT | Status: DC
  Administered 2015-08-14 – 2015-08-17 (×4): 1 via RESPIRATORY_TRACT
  Filled 2015-08-14: qty 28

## 2015-08-14 MED ORDER — SODIUM CHLORIDE 0.9 % IV SOLN
80.0000 mg | Freq: Once | INTRAVENOUS | Status: AC
  Administered 2015-08-14: 80 mg via INTRAVENOUS
  Filled 2015-08-14: qty 80

## 2015-08-14 MED ORDER — VERAPAMIL HCL ER 180 MG PO TBCR: 180.0000 mg | EXTENDED_RELEASE_TABLET | Freq: Every day | ORAL | Status: DC

## 2015-08-14 MED ORDER — ACETAMINOPHEN 650 MG RE SUPP: 650.0000 mg | Freq: Four times a day (QID) | RECTAL | Status: DC | PRN

## 2015-08-14 MED ORDER — ONDANSETRON HCL 4 MG/2ML IJ SOLN: 4.0000 mg | Freq: Four times a day (QID) | INTRAMUSCULAR | Status: DC | PRN

## 2015-08-14 MED ORDER — ACETAMINOPHEN 325 MG PO TABS
650.0000 mg | ORAL_TABLET | Freq: Four times a day (QID) | ORAL | Status: DC | PRN
  Filled 2015-08-14: qty 2

## 2015-08-14 MED ORDER — ESCITALOPRAM OXALATE 10 MG PO TABS: 20.0000 mg | ORAL_TABLET | Freq: Every day | ORAL | Status: DC

## 2015-08-14 MED ORDER — SODIUM CHLORIDE 0.9 % IV SOLN
8.0000 mg/h | INTRAVENOUS | Status: DC
  Administered 2015-08-14 – 2015-08-15 (×2): 8 mg/h via INTRAVENOUS
  Filled 2015-08-14 (×7): qty 80

## 2015-08-14 MED ORDER — ALBUTEROL SULFATE HFA 108 (90 BASE) MCG/ACT IN AERS: 2.0000 | INHALATION_SPRAY | Freq: Four times a day (QID) | RESPIRATORY_TRACT | Status: DC | PRN

## 2015-08-14 MED ORDER — MORPHINE SULFATE (PF) 2 MG/ML IV SOLN: 1.0000 mg | INTRAVENOUS | Status: DC | PRN

## 2015-08-14 MED ORDER — VERAPAMIL HCL ER 180 MG PO TBCR
180.0000 mg | EXTENDED_RELEASE_TABLET | Freq: Every day | ORAL | Status: DC
  Administered 2015-08-14: 180 mg via ORAL
  Filled 2015-08-14 (×2): qty 1

## 2015-08-14 MED ORDER — ONDANSETRON HCL 4 MG PO TABS: 4.0000 mg | ORAL_TABLET | Freq: Four times a day (QID) | ORAL | Status: DC | PRN

## 2015-08-14 MED ORDER — TIOTROPIUM BROMIDE MONOHYDRATE 18 MCG IN CAPS
18.0000 ug | ORAL_CAPSULE | Freq: Every day | RESPIRATORY_TRACT | Status: DC
  Administered 2015-08-15 – 2015-08-17 (×3): 18 ug via RESPIRATORY_TRACT
  Filled 2015-08-14 (×2): qty 5

## 2015-08-14 MED ORDER — ALBUTEROL SULFATE (2.5 MG/3ML) 0.083% IN NEBU: 2.5000 mg | INHALATION_SOLUTION | Freq: Four times a day (QID) | RESPIRATORY_TRACT | Status: DC | PRN

## 2015-08-14 MED ORDER — TIOTROPIUM BROMIDE MONOHYDRATE 18 MCG IN CAPS
18.0000 ug | ORAL_CAPSULE | Freq: Every day | RESPIRATORY_TRACT | Status: DC
  Filled 2015-08-14: qty 5

## 2015-08-14 MED ORDER — SODIUM CHLORIDE 0.9% FLUSH
3.0000 mL | Freq: Two times a day (BID) | INTRAVENOUS | Status: DC
  Administered 2015-08-15 – 2015-08-18 (×7): 3 mL via INTRAVENOUS

## 2015-08-14 MED ORDER — LAMOTRIGINE 150 MG PO TABS
150.0000 mg | ORAL_TABLET | Freq: Every day | ORAL | Status: DC
  Administered 2015-08-15 – 2015-08-18 (×4): 150 mg via ORAL
  Filled 2015-08-14 (×7): qty 1

## 2015-08-14 NOTE — Consult Note (Addendum)
Reason for Consult: Chest pain and sob in a patient with GI Bleed  Referring Physician: Dr. Neysa Hotter Eduardo [name] is an 82 y.o. male.   HPI: The patient is an 82 yo man with multiple medical problems who presents with chest pain and sob in the setting of acute blood loss anemia. He has a baseline Hgb of 12-13 and today was found to be 6.5 after presenting with a couple of days of dyspnea with exertion and sscp which is dull but does not radiate. He notes black stools for a week.  His ECG in the ER demontrated atrial fib and diffuse STT changes but no acute findings. His history is notable for multiple GI bleeds though this is the first on Eliquis. He had a stroke last year. He appears to have been in chronic atrial fib since 2013. I did not go back further in the chart. His primary cardiologist is Dr. Tamala Julian.   PMH: Past Medical History  Diagnosis Date  . Anemia   . Anxiety   . Atrial fibrillation ([place])   . CAD (coronary artery disease)   . Hyperlipidemia   . COPD (chronic obstructive pulmonary disease) ([place])   . Allergy   . Osteoarthritis   . Hx of colonic polyps   . Hyperplastic polyp of intestine 2001  . Adenomatous polyp 2001  . Myocardial infarction ([place]) 06/2010    "twice; 1 day apart; during/after cath"  . Exertional dyspnea   . S/P appy   . Arrhythmia   . Stroke [facility])     PSHX: Past Surgical History  Procedure Laterality Date  . Appendectomy    . Carpal tunnel release      left  . Mastoidectomy      x3 on right  . Pilonidal cyst / sinus excision    . Knee arthroscopy      left, right knee 10/2009  . Shoulder arthroscopy      right  . Foot fracture surgery      right heel repair  . Nasal septum surgery      nasal septal repair  . Penile prosthesis implant      x 2--got infection after 2nd and had to remove  . Cataract extraction w/ intraocular lens implant  2/13    Dr [name]  . Carpal tunnel release  10/11/11    Dr Rush Barer  .  Tonsillectomy and adenoidectomy    . Fracture surgery    . Cardiac catheterization  2012    50% LAD and luminal irreg in RCA, 1/12  Post cath MI from damage  . Penile prosthesis  removal      FAMHX: Family History  Problem Relation Age of Onset  . Diabetes Neg Hx   . Hypertension Neg Hx   . Heart attack Father   . Cancer Mother     ? leukemia    Social History:  reports that he quit smoking about 37 years ago. His smoking use included Cigarettes. He has a 75 pack-year smoking history. He has never used smokeless tobacco. He reports that he does not drink alcohol or use illicit drugs.  Allergies:  Allergies  Allergen Reactions  . Penicillins Itching    Has patient had a PCN reaction causing immediate rash, facial/tongue/throat swelling, SOB or lightheadedness with hypotension: Yes Has patient had a PCN reaction causing severe rash involving mucus membranes or skin necrosis: No Has patient had a PCN reaction that required hospitalization No Has patient had a PCN  reaction occurring within the last 10 years: No If all of the above answers are "NO", then may proceed with Cephalosporin use.   . Sulfonamide Derivatives Other (See Comments)    "it affected my kidneys"  . Tape Other (See Comments)    Arms black and blue, tears skin.  Please use "paper" tape    Medications: reviewed. Eliquis will be held.  Dg Chest 2 View  08/14/2015  CLINICAL DATA:  Shortness of breath for 1 week EXAM: CHEST  2 VIEW COMPARISON:  05/23/2015 FINDINGS: There is moderate cardiac enlargement. Diffuse bilateral bronchial wall thickening is identified. Basilar predominant interstitial coarsening is also noted. No superimposed pulmonary edema, airspace consolidation or pleural effusion identified. Spondylosis is present within the thoracic spine. IMPRESSION: 1. No acute findings. 2. Changes of chronic bronchitis. Electronically Signed   By: Kerby Moors M.D.   On: 08/14/2015 10:31    ROS  As stated in the  HPI and negative for all other systems.  Physical Exam  Vitals:Blood pressure 129/81, pulse 98, temperature 98.7 F (37.1 C), temperature source Oral, resp. rate 24, height 5\' 3"  (1.6 m), weight 157 lb 6.4 oz (71.396 kg), SpO2 97 %.  Chronically ill appearing elderly man, NAD HEENT: Unremarkable Neck:  7 cm JVD, no thyromegally Lymphatics:  No adenopathy Back:  No CVA tenderness Lungs:  Clear with scattered rales HEART:  IRegular rate rhythm, no murmurs, no rubs, no clicks Abd:  soft, positive bowel sounds, no organomegally, no rebound, no guarding Ext:  2 plus pulses, no edema, no cyanosis, no clubbing Skin:  No rashes no nodules Neuro: non-focal. Speech is intact   ECG - atrial fib with diffuse STT abnormality  Labs - reviewed   Assessment/Plan: 1. Crescendo angina with borderline troponin - this is due to supply demand mismatch and does not represent an acute coronary occlusion. Treatment will be supportive. No indication for invasive evaluation. Might consider an outpatient stress test.  2. Acute GI bleed - he has had black stools for a week. He will need GI eval and at least endoscopy. He will need his Eliquis held in the short term. He will likely need 2 units of PRBC's. Watch for volume overload and can use IV lasix if the patient develops sob with transfusion. 3. Stroke prevention - if GI bleed can be treated, would suggest he undergo re-initiation of Eliquis when ok with GI team followed by insertion of a Watchman device followed by discontinuation of Eliquis.  4. Atrial fib - this is a chronic problem. His rate is controlled. Continue home meds for rate control.   Carleene Overlie TaylorMD 08/14/2015, 2:48 PM

## 2015-08-14 NOTE — H&P (Signed)
[facility] History and Physical  Eduardo [name] [id number] DOB: [birth year] DOA: 08/14/2015  Referring physician: Wilson Singer PCP: [name], MD   Chief Complaint: worsening dyspnea and chest pain  HPI: Eduardo [name] is a delightful 82 y.o. male with a past medical history that includes A. Fib on eliquis, COPD not on home oxygen, chronic diastolic heart failure, hypertension presents to the emergency Department chief complaint of worsening dyspnea with exertion and intermittent chest pain. Initial evaluation reveals anemia likely related to acute blood loss from GI bleed, elevated troponin.  Information is obtained from the patient reports worsening dyspnea with exertion over the last 2 days. Associated symptoms include 3-4 melonic loose stools over the last 2 days as well. Addition he's experienced intermittent chest pain. Pain is located left anterior radiates to both shoulders described as sharp. Each episode last only a minute or 2. He denies abdominal pain nausea vomiting. He denies headache dizziness syncope or near-syncope. He denies fever chills cough dysuria hematuria frequency or urgency. He does take Eliquis. He reported earlier that he also takes ibuprofen but this is error. He takes tylenol regularly.   The emergency department he's afebrile hemodynamically stable and not hypoxic. He is provided with 2 units of normal saline protonix bolus and  Protonix drip initiated.   Review of Systems:  10 point review of systems complete and all systems are negative except as indicated in the history of present illness   Past Medical History  Diagnosis Date  . Anemia   . Anxiety   . Atrial fibrillation ([place])   . CAD (coronary artery disease)   . Hyperlipidemia   . COPD (chronic obstructive pulmonary disease) ([place])   . Allergy   . Osteoarthritis   . Hx of colonic polyps   . Hyperplastic polyp of intestine 2001  . Adenomatous polyp 2001  . Myocardial infarction ([place])  06/2010    "twice; 1 day apart; during/after cath"  . Exertional dyspnea   . S/P appy   . Arrhythmia   . Stroke [facility])    Past Surgical History  Procedure Laterality Date  . Appendectomy    . Carpal tunnel release      left  . Mastoidectomy      x3 on right  . Pilonidal cyst / sinus excision    . Knee arthroscopy      left, right knee 10/2009  . Shoulder arthroscopy      right  . Foot fracture surgery      right heel repair  . Nasal septum surgery      nasal septal repair  . Penile prosthesis implant      x 2--got infection after 2nd and had to remove  . Cataract extraction w/ intraocular lens implant  2/13    Dr [name]  . Carpal tunnel release  10/11/11    Dr Rush Barer  . Tonsillectomy and adenoidectomy    . Fracture surgery    . Cardiac catheterization  2012    50% LAD and luminal irreg in RCA, 1/12  Post cath MI from damage  . Penile prosthesis  removal     Social History:  reports that he quit smoking about 37 years ago. His smoking use included Cigarettes. He has a 75 pack-year smoking history. He has never used smokeless tobacco. He reports that he does not drink alcohol or use illicit drugs. He is married lives at home with his wife and has done so for the last 30 years. He is  a retired Chief Financial Officer. He is independent with ADLs Allergies  Allergen Reactions  . Penicillins Itching    Has patient had a PCN reaction causing immediate rash, facial/tongue/throat swelling, SOB or lightheadedness with hypotension: Yes Has patient had a PCN reaction causing severe rash involving mucus membranes or skin necrosis: No Has patient had a PCN reaction that required hospitalization No Has patient had a PCN reaction occurring within the last 10 years: No If all of the above answers are "NO", then may proceed with Cephalosporin use.   . Sulfonamide Derivatives Other (See Comments)    "it affected my kidneys"  . Tape Other (See Comments)    Arms black and blue, tears skin.   Please use "paper" tape    Family History  Problem Relation Age of Onset  . Diabetes Neg Hx   . Hypertension Neg Hx   . Heart attack Father   . Cancer Mother     ? leukemia     Prior to Admission medications   Medication Sig Start Date End Date Taking? Authorizing Provider  acetaminophen (TYLENOL) 650 MG CR tablet Take 650 mg by mouth 3 (three) times daily.    Historical Provider, MD  albuterol (PROAIR HFA) 108 (90 BASE) MCG/ACT inhaler Inhale 2 puffs into the lungs every 6 (six) hours as needed for wheezing or shortness of breath. 12/08/14   Collene Gobble, MD  apixaban (ELIQUIS) 5 MG TABS tablet Take 1 tablet (5 mg total) by mouth 2 (two) times daily. 08/02/15   Minna Merritts, MD  aspirin 81 MG tablet Take 81 mg by mouth daily.    Historical Provider, MD  atorvastatin (LIPITOR) 20 MG tablet Take 1 tablet (20 mg total) by mouth daily. 02/15/15   Belva Crome, MD  azelastine (ASTELIN) 0.1 % nasal spray Place 2 sprays into both nostrils daily. Use in each nostril as directed    Historical Provider, MD  B Complex-C (SUPER B COMPLEX PO) Take by mouth.    Historical Provider, MD  BREO ELLIPTA 100-25 MCG/INH AEPB Inhale 1 puff into the lungs 2 (two) times daily. 07/20/15   Historical Provider, MD  cetirizine (ZYRTEC) 10 MG tablet Take 10 mg by mouth daily.    Historical Provider, MD  escitalopram (LEXAPRO) 20 MG tablet Take 20 mg by mouth daily.     Historical Provider, MD  fenofibrate 160 MG tablet Take 1 tablet (160 mg total) by mouth daily. 06/16/15   Minna Merritts, MD  Flavocoxid-Cit Zn Bisglcinate (LIMBREL500) 500-50 MG CAPS 1 po bid prn 04/25/15   Historical Provider, MD  lamoTRIgine (LAMICTAL) 150 MG tablet Take 150 mg by mouth daily. 07/07/15   Historical Provider, MD  Multiple Vitamin (MULTIVITAMIN) tablet Take 1 tablet by mouth daily.    Historical Provider, MD  Tiotropium Bromide-Olodaterol (STIOLTO RESPIMAT) 2.5-2.5 MCG/ACT AERS Inhale 2 puffs into the lungs daily. 06/10/15   Tanda Rockers, MD  triamcinolone cream (KENALOG) 0.1 % Apply 1 application topically daily as needed (for skin).  08/10/15   Historical Provider, MD  verapamil (CALAN-SR) 180 MG CR tablet Take 1 tablet (180 mg total) by mouth at bedtime. 01/21/15   Belva Crome, MD   Physical Exam: Filed Vitals:   08/14/15 1130 08/14/15 1257 08/14/15 1313 08/14/15 1321  BP: 122/55 131/67 138/53 132/60  Pulse: 80 98 97 100  Temp:  98.6 F (37 C)  98.7 F (37.1 C)  TempSrc:  Oral  Oral  Resp: 19 18 19  19  Height:      Weight:      SpO2: 98% 97% 97% 97%    Wt Readings from Last 3 Encounters:  08/14/15 71.396 kg (157 lb 6.4 oz)  06/22/15 71.487 kg (157 lb 9.6 oz)  06/10/15 71.215 kg (157 lb)    General:  Appears calm and comfortable, Slightly pale Eyes: PERRL, normal lids, irises & conjunctiva ENT: grossly normal hearing, lips & tongue his membranes of his mouth are slightly dry slightly pale Neck: no LAD, masses or thyromegaly Cardiovascular: Irregularly irregular, no m/r/g. No LE edema.  Respiratory: CTA bilaterally, no w/r/r. Normal respiratory effort. Mild use of abdominal accessory muscles Abdomen:  slightly firm sluggish bowel sounds nontender to palpation throughout, no guarding or rebounding Skin: no rash or induration seen on limited exam Musculoskeletal: grossly normal tone BUE/BLE Psychiatric: grossly normal mood and affect, speech fluent and appropriate Neurologic: grossly non-focal. Speech clear facial symmetry           Labs on Admission:  Basic Metabolic Panel:  Recent Labs Lab 08/14/15 1000  NA 139  K 4.2  CL 104  CO2 21*  GLUCOSE 100*  BUN 45*  CREATININE 1.13  CALCIUM 9.6   Liver Function Tests: No results for input(s): AST, ALT, ALKPHOS, BILITOT, PROT, ALBUMIN in the last 168 hours. No results for input(s): LIPASE, AMYLASE in the last 168 hours. No results for input(s): AMMONIA in the last 168 hours. CBC:  Recent Labs Lab 08/14/15 1000  WBC 11.3*  HGB 6.5*  HCT  19.8*  MCV 90.8  PLT 245   Cardiac Enzymes: No results for input(s): CKTOTAL, CKMB, CKMBINDEX, TROPONINI in the last 168 hours.  BNP (last 3 results) No results for input(s): BNP in the last 8760 hours.  ProBNP (last 3 results) No results for input(s): PROBNP in the last 8760 hours.  CBG: No results for input(s): GLUCAP in the last 168 hours.  Radiological Exams on Admission: Dg Chest 2 View  08/14/2015  CLINICAL DATA:  Shortness of breath for 1 week EXAM: CHEST  2 VIEW COMPARISON:  05/23/2015 FINDINGS: There is moderate cardiac enlargement. Diffuse bilateral bronchial wall thickening is identified. Basilar predominant interstitial coarsening is also noted. No superimposed pulmonary edema, airspace consolidation or pleural effusion identified. Spondylosis is present within the thoracic spine. IMPRESSION: 1. No acute findings. 2. Changes of chronic bronchitis. Electronically Signed   By: Kerby Moors M.D.   On: 08/14/2015 10:31    EKG: Independently reviewed. Atrial fibrillation Incomplete left bundle branch block ST depr, consider ischemia, anterolateral lds  Assessment/Plan Principal Problem:   Acute blood loss anemia Active Problems:   Hyperlipemia   Essential hypertension   Atrial fibrillation (HCC)   COPD (chronic obstructive pulmonary disease) with emphysema (HCC)   Chronic diastolic HF (heart failure) (HCC)   Chest pain  #1. Acute blood loss anemia. Likely related to GI bleed given melenic stools in setting of Eliquis. Hemoglobin 6.5 on admission. Chart review indicates 3 months ago hemoglobin 12.7. Chart review indicates last colonoscopy 2011 revealing 1 polyp otherwise normal. He is afebrile and hemodynamically stable and not hypoxic. -Admit to step down -Transfuse 2 units of packed red blood cells -Obtain serial CBCs -Continue protonix gtt -hold eliquis -discussed with GI who will see in am (given pt is stable) -Nothing by mouth  #2. Chest pain. Likely demand  ischemia in the setting of A. fib and chronic diastolic heart failure. Troponin 0.45. EKG without acute changes. Improved on admission -Monitor on telemetry -Cycle  troponins -Serial EKG -Cardiology consult  #3. A. Fib. [place] score 6. On eliquis. Currently rate controlled. Home medications include Eliquis, verapamil. -Hold Eliquis -Holding verapamil do to nothing by mouth status -monitor on tele  4. Chronic diastolic heart failure. Currently compensated. EF in July 2016 60%. -Daily weights -Monitor intake and output  #5. Hypertension. Blood pressure controlled in the emergency department. Home medications include verapamil. -Monitor  #6. COPD. Not on home oxygen. Appears stable at baseline. Will continue home inhalers  Dr Henrene Pastor gastroenterology Aaron Edelman with cardiology   Code Status: limited DVT Prophylaxis: Family Communication: wife [name]) at bedside Disposition Plan: home when ready  Time spent: 66 minutes  [facility]

## 2015-08-14 NOTE — ED Notes (Signed)
Eduardo Black, NP at bedside 

## 2015-08-14 NOTE — ED Notes (Signed)
Attempted report x1. 

## 2015-08-14 NOTE — ED Notes (Signed)
Attempted report x 2 

## 2015-08-14 NOTE — ED Notes (Signed)
Pt and family member asking about bed placement. Explained delay.

## 2015-08-14 NOTE — ED Notes (Signed)
Pt reports shortness of breath x 1 week, worse with exertion but still occurs at rest. Symptoms especially worse last 2 days. Pt took home inhalers in previous days with some relief, has not taken any today. Pt hx COPD, A Fib, 2 prev MIs.

## 2015-08-14 NOTE — Telephone Encounter (Signed)
82 yo male with COPD, Afib, CAD.  I spoke to the patient's wife who relayed the issues Eduardo [name] is having:  CP, which radiates across his shoulders and worsening dyspnea for the last two days.  Exertion seems to make it worse.  I  Recommended coming to the ER for evaluation and not waiting until tomorrow.  Tarri Fuller PAC

## 2015-08-14 NOTE — ED Provider Notes (Signed)
CSN: [id number]     Arrival date & time 08/14/15  [id number] History   First MD Initiated Contact with Patient 08/14/15 1034     Chief Complaint  Patient presents with  . Shortness of Breath  . Chest Pain     (Consider location/radiation/quality/duration/timing/severity/associated sxs/prior Treatment) HPI   82yM with exertional dyspnea. Worsening over past week or so. Feels ok at rest but now even minimal effort makes him "winded." Sometimes also has pressure in upper chest which improved with rest. No fever or chills. No cough. No unusual swelling. Has had melanotic stool for the last several days.  He is on eliquis for afib/previous CVA. No hx of PUD. Doesn't drink alcohol. Has been taking 600mg  ibuprofen twice per day for the last several weeks for back pain.   Past Medical History  Diagnosis Date  . Anemia   . Anxiety   . Atrial fibrillation ([place])   . CAD (coronary artery disease)   . Hyperlipidemia   . COPD (chronic obstructive pulmonary disease) ([place])   . Allergy   . Osteoarthritis   . Hx of colonic polyps   . Hyperplastic polyp of intestine 2001  . Adenomatous polyp 2001  . Myocardial infarction ([place]) 06/2010    "twice; 1 day apart; during/after cath"  . Exertional dyspnea   . S/P appy   . Arrhythmia   . Stroke [facility])    Past Surgical History  Procedure Laterality Date  . Appendectomy    . Carpal tunnel release      left  . Mastoidectomy      x3 on right  . Pilonidal cyst / sinus excision    . Knee arthroscopy      left, right knee 10/2009  . Shoulder arthroscopy      right  . Foot fracture surgery      right heel repair  . Nasal septum surgery      nasal septal repair  . Penile prosthesis implant      x 2--got infection after 2nd and had to remove  . Cataract extraction w/ intraocular lens implant  2/13    Dr [name]  . Carpal tunnel release  10/11/11    Dr Rush Barer  . Tonsillectomy and adenoidectomy    . Fracture surgery    . Cardiac  catheterization  2012    50% LAD and luminal irreg in RCA, 1/12  Post cath MI from damage  . Penile prosthesis  removal     Family History  Problem Relation Age of Onset  . Diabetes Neg Hx   . Hypertension Neg Hx   . Heart attack Father   . Cancer Mother     ? leukemia   Social History  Substance Use Topics  . Smoking status: Former Smoker -- 2.50 packs/day for 30 years    Types: Cigarettes    Quit date: 06/11/1978  . Smokeless tobacco: Never Used  . Alcohol Use: No     Comment: QUIT [id number] alcoholic    Review of Systems  All systems reviewed and negative, other than as noted in HPI.   Allergies  Penicillins and Sulfonamide derivatives  Home Medications   Prior to Admission medications   Medication Sig Start Date End Date Taking? Authorizing Provider  acetaminophen (TYLENOL) 650 MG CR tablet Take 650 mg by mouth 3 (three) times daily.    Historical Provider, MD  albuterol (PROAIR HFA) 108 (90 BASE) MCG/ACT inhaler Inhale 2 puffs into the lungs every 6 (six) hours as  needed for wheezing or shortness of breath. 12/08/14   Collene Gobble, MD  apixaban (ELIQUIS) 5 MG TABS tablet Take 1 tablet (5 mg total) by mouth 2 (two) times daily. 08/02/15   Minna Merritts, MD  aspirin 81 MG tablet Take 81 mg by mouth daily.    Historical Provider, MD  atorvastatin (LIPITOR) 20 MG tablet Take 1 tablet (20 mg total) by mouth daily. 02/15/15   Belva Crome, MD  azelastine (ASTELIN) 0.1 % nasal spray Place 2 sprays into both nostrils daily. Use in each nostril as directed    Historical Provider, MD  B Complex-C (SUPER B COMPLEX PO) Take by mouth.    Historical Provider, MD  cetirizine (ZYRTEC) 10 MG tablet Take 10 mg by mouth daily.    Historical Provider, MD  escitalopram (LEXAPRO) 20 MG tablet Take 20 mg by mouth daily.     Historical Provider, MD  fenofibrate 160 MG tablet Take 1 tablet (160 mg total) by mouth daily. 06/16/15   Minna Merritts, MD  Flavocoxid-Cit Zn Bisglcinate (LIMBREL500)  500-50 MG CAPS 1 po bid prn 04/25/15   Historical Provider, MD  Multiple Vitamin (MULTIVITAMIN) tablet Take 1 tablet by mouth daily.    Historical Provider, MD  Tiotropium Bromide-Olodaterol (STIOLTO RESPIMAT) 2.5-2.5 MCG/ACT AERS Inhale 2 puffs into the lungs daily. 06/10/15   Tanda Rockers, MD  verapamil (CALAN-SR) 180 MG CR tablet Take 1 tablet (180 mg total) by mouth at bedtime. 01/21/15   Belva Crome, MD   BP 122/55 mmHg  Pulse 80  Temp(Src) 97.6 F (36.4 C) (Oral)  Resp 19  Ht 5\' 3"  (1.6 m)  Wt 157 lb 6.4 oz (71.396 kg)  BMI 27.89 kg/m2  SpO2 98% Physical Exam  Constitutional: He appears well-developed and well-nourished. No distress.  Sitting in bed. Pleasant. NAD.   HENT:  Head: Normocephalic and atraumatic.  Eyes: Right eye exhibits no discharge. Left eye exhibits no discharge.  Pale conjunctiva  Neck: Neck supple.  Cardiovascular: Regular rhythm and normal heart sounds.  Exam reveals no gallop and no friction rub.   No murmur heard. irreg irreg  Pulmonary/Chest: Effort normal and breath sounds normal. No respiratory distress.  Abdominal: Soft. He exhibits no distension. There is no tenderness.  Musculoskeletal: He exhibits no edema or tenderness.  Neurological: He is alert.  Skin: Skin is warm and dry.  Psychiatric: He has a normal mood and affect. His behavior is normal. Thought content normal.  Nursing note and vitals reviewed.   ED Course  Procedures (including critical care time)  CRITICAL CARE Performed by: Virgel Manifold Total critical care time: 35 minutes Critical care time was exclusive of separately billable procedures and treating other patients. Critical care was necessary to treat or prevent imminent or life-threatening deterioration. Critical care was time spent personally by me on the following activities: development of treatment plan with patient and/or surrogate as well as nursing, discussions with consultants, evaluation of patient's response  to treatment, examination of patient, obtaining history from patient or surrogate, ordering and performing treatments and interventions, ordering and review of laboratory studies, ordering and review of radiographic studies, pulse oximetry and re-evaluation of patient's condition.  Labs Review Labs Reviewed  BASIC METABOLIC PANEL - Abnormal; Notable for the following:    CO2 21 (*)    Glucose, Bld 100 (*)    BUN 45 (*)    GFR calc non Af Amer 59 (*)    All other components within normal  limits  CBC - Abnormal; Notable for the following:    WBC 11.3 (*)    RBC 2.18 (*)    Hemoglobin 6.5 (*)    HCT 19.8 (*)    RDW 16.2 (*)    All other components within normal limits  I-STAT TROPOININ, ED - Abnormal; Notable for the following:    Troponin i, poc 0.45 (*)    All other components within normal limits  OCCULT BLOOD X 1 CARD TO LAB, STOOL  PREPARE RBC (CROSSMATCH)  TYPE AND SCREEN    Imaging Review Dg Chest 2 View  08/14/2015  CLINICAL DATA:  Shortness of breath for 1 week EXAM: CHEST  2 VIEW COMPARISON:  05/23/2015 FINDINGS: There is moderate cardiac enlargement. Diffuse bilateral bronchial wall thickening is identified. Basilar predominant interstitial coarsening is also noted. No superimposed pulmonary edema, airspace consolidation or pleural effusion identified. Spondylosis is present within the thoracic spine. IMPRESSION: 1. No acute findings. 2. Changes of chronic bronchitis. Electronically Signed   By: Kerby Moors M.D.   On: 08/14/2015 10:31   I have personally reviewed and evaluated these images and lab results as part of my medical decision-making.   EKG Interpretation   Date/Time:  Sunday August 14 2015 09:48:57 EST Ventricular Rate:  77 PR Interval:    QRS Duration: 118 QT Interval:  366 QTC Calculation: 414 R Axis:   80 Text Interpretation:  Atrial fibrillation Incomplete left bundle branch  block ST depr, consider ischemia, anterolateral lds Confirmed by Wilson Singer   MD,  Reisa Coppola ([id number]) on 08/14/2015 10:42:33 AM      MDM   Final diagnoses:  Acute blood loss anemia    82 year old male with exertional dyspnea and fatigue. Significantly anemic. Hemoglobin from baseline around 12 -> 6. Reports melanotic stool recently. He is on Eliquis and also reports taking ibuprofen regularly for the past several weeks or back pain. Likely upper GI bleed. Protonix started. Will transfuse. He does complain of some chest pain with exertion as well. His troponin is mildly elevated. I suspect this is more secondary to demand ischemia but he does have a known history of CAD.    Virgel Manifold, MD 08/16/15 [phone]

## 2015-08-15 ENCOUNTER — Encounter (HOSPITAL_COMMUNITY): Payer: Self-pay | Admitting: Physician Assistant

## 2015-08-15 ENCOUNTER — Encounter (HOSPITAL_COMMUNITY)
Admission: EM | Disposition: A | Discharge status: Home / Self Care | Payer: Self-pay | Source: Home / Self Care | Attending: Internal Medicine

## 2015-08-15 DIAGNOSIS — D62 Acute posthemorrhagic anemia: Secondary | ICD-10-CM

## 2015-08-15 DIAGNOSIS — I110 Hypertensive heart disease with heart failure: Secondary | ICD-10-CM | POA: Diagnosis not present

## 2015-08-15 DIAGNOSIS — K921 Melena: Secondary | ICD-10-CM | POA: Diagnosis present

## 2015-08-15 DIAGNOSIS — K31811 Angiodysplasia of stomach and duodenum with bleeding: Secondary | ICD-10-CM | POA: Diagnosis present

## 2015-08-15 DIAGNOSIS — I482 Chronic atrial fibrillation: Secondary | ICD-10-CM | POA: Diagnosis not present

## 2015-08-15 DIAGNOSIS — I5032 Chronic diastolic (congestive) heart failure: Secondary | ICD-10-CM | POA: Diagnosis not present

## 2015-08-15 HISTORY — PX: ESOPHAGOGASTRODUODENOSCOPY: SHX5428

## 2015-08-15 LAB — CBC
HEMATOCRIT: 23.2 % — AB (ref 39.0–52.0)
HEMATOCRIT: 27.9 % — AB (ref 39.0–52.0)
HEMOGLOBIN: 7.6 g/dL — AB (ref 13.0–17.0)
Hemoglobin: 9.1 g/dL — ABNORMAL LOW (ref 13.0–17.0)
MCH: 28.6 pg (ref 26.0–34.0)
MCH: 29 pg (ref 26.0–34.0)
MCHC: 32.8 g/dL (ref 30.0–36.0)
MCHC: 32.6 g/dL (ref 30.0–36.0)
MCV: 87.2 fL (ref 78.0–100.0)
MCV: 88.9 fL (ref 78.0–100.0)
PLATELETS: 192 10*3/uL (ref 150–400)
Platelets: 203 10*3/uL (ref 150–400)
RBC: 2.66 MIL/uL — ABNORMAL LOW (ref 4.22–5.81)
RBC: 3.14 MIL/uL — AB (ref 4.22–5.81)
RDW: 18.9 % — AB (ref 11.5–15.5)
RDW: 18 % — ABNORMAL HIGH (ref 11.5–15.5)
WBC: 8.5 10*3/uL (ref 4.0–10.5)
WBC: 7.7 10*3/uL (ref 4.0–10.5)

## 2015-08-15 LAB — COMPREHENSIVE METABOLIC PANEL
ALBUMIN: 3.3 g/dL — AB (ref 3.5–5.0)
ALT: 18 U/L (ref 17–63)
ANION GAP: 7 (ref 5–15)
AST: 29 U/L (ref 15–41)
Alkaline Phosphatase: 28 U/L — ABNORMAL LOW (ref 38–126)
BUN: 34 mg/dL — AB (ref 6–20)
CALCIUM: 8.8 mg/dL — AB (ref 8.9–10.3)
CO2: 24 mmol/L (ref 22–32)
Chloride: 111 mmol/L (ref 101–111)
Creatinine, Ser: 1.03 mg/dL (ref 0.61–1.24)
GFR calc Af Amer: >60 mL/min (ref >60)
GFR calc non Af Amer: >60 mL/min (ref >60)
GLUCOSE: 95 mg/dL (ref 65–99)
Potassium: 3.7 mmol/L (ref 3.5–5.1)
SODIUM: 142 mmol/L (ref 135–145)
Total Bilirubin: 0.5 mg/dL (ref 0.3–1.2)
Total Protein: 5.2 g/dL — ABNORMAL LOW (ref 6.5–8.1)

## 2015-08-15 LAB — PREPARE RBC (CROSSMATCH)

## 2015-08-15 LAB — TROPONIN I: TROPONIN I: 0.58 ng/mL — AB (ref <0.031)

## 2015-08-15 SURGERY — EGD (ESOPHAGOGASTRODUODENOSCOPY)
Anesthesia: Moderate Sedation

## 2015-08-15 MED ORDER — SODIUM CHLORIDE 0.9 % IV SOLN
INTRAVENOUS | Status: DC
  Administered 2015-08-15: 10:00:00 via INTRAVENOUS

## 2015-08-15 MED ORDER — PANTOPRAZOLE SODIUM 40 MG PO TBEC
40.0000 mg | DELAYED_RELEASE_TABLET | Freq: Every day | ORAL | Status: DC
  Administered 2015-08-16 – 2015-08-18 (×3): 40 mg via ORAL
  Filled 2015-08-15 (×3): qty 1

## 2015-08-15 MED ORDER — MIDAZOLAM HCL 5 MG/ML IJ SOLN
INTRAMUSCULAR | Status: AC
  Filled 2015-08-15: qty 2

## 2015-08-15 MED ORDER — BUTAMBEN-TETRACAINE-BENZOCAINE 2-2-14 % EX AERO
INHALATION_SPRAY | CUTANEOUS | Status: DC | PRN
  Administered 2015-08-15: 2 via TOPICAL

## 2015-08-15 MED ORDER — MIDAZOLAM HCL 10 MG/2ML IJ SOLN
INTRAMUSCULAR | Status: DC | PRN
  Administered 2015-08-15: 2 mg via INTRAVENOUS
  Administered 2015-08-15: 1 mg via INTRAVENOUS

## 2015-08-15 MED ORDER — DIPHENHYDRAMINE HCL 50 MG/ML IJ SOLN
INTRAMUSCULAR | Status: AC
  Filled 2015-08-15: qty 1

## 2015-08-15 MED ORDER — VERAPAMIL HCL ER 180 MG PO TBCR
180.0000 mg | EXTENDED_RELEASE_TABLET | Freq: Every day | ORAL | Status: DC
  Administered 2015-08-15 – 2015-08-17 (×3): 180 mg via ORAL
  Filled 2015-08-15 (×3): qty 1

## 2015-08-15 MED ORDER — SODIUM CHLORIDE 0.9 % IV SOLN
Freq: Once | INTRAVENOUS | Status: AC
  Administered 2015-08-15: 13:00:00 via INTRAVENOUS

## 2015-08-15 MED ORDER — FENTANYL CITRATE (PF) 100 MCG/2ML IJ SOLN
INTRAMUSCULAR | Status: DC | PRN
Start: 2015-08-15 — End: 2015-08-15
  Administered 2015-08-15: 25 ug via INTRAVENOUS

## 2015-08-15 MED ORDER — ESCITALOPRAM OXALATE 10 MG PO TABS
20.0000 mg | ORAL_TABLET | Freq: Every day | ORAL | Status: DC
  Administered 2015-08-15 – 2015-08-17 (×4): 20 mg via ORAL
  Filled 2015-08-15 (×4): qty 2

## 2015-08-15 MED ORDER — FENTANYL CITRATE (PF) 100 MCG/2ML IJ SOLN
INTRAMUSCULAR | Status: AC
  Filled 2015-08-15: qty 2

## 2015-08-15 NOTE — Progress Notes (Addendum)
[facility] TEAM 1 - Stepdown/ICU TEAM PROGRESS NOTE  Eduardo [name] [id number] DOB: [birth year] DOA: 08/14/2015 PCP: [name], MD  Admit HPI / Brief Narrative: 82 yo M with history of A fib on Eliquis, COPD, and HTN who presented to the ED with exertional dyspnea and chest pressure. He had been having melena for a few days and was found to have a hemoglobin of 6.5 with a baseline of 12.7 3 months prior. His troponin was elevated at 0.45 w/ depression and TWI in V5-6.   HPI/Subjective: The patient is sitting up at the bedside and resting comfortably.  He denies any chest pain shortness breath fevers chills nausea or vomiting.  He is quite pleasant and can provide a full history.  Assessment/Plan:  UGIB on anticoag - melena  For endoscopic evaluation today - holding anticoag   Acute blood loss anemia  Goal will be to keep Hgb 8-9 - transfuse 1 additional unit (total of 4 thus far) and recheck Hgb this PM   Chest pain - Hx of CAD Cards suggests no ASA - Cards following - no plans for further CAD evaluation at this time   Afib  CHA2DS2-VASc 6 - on chronic eliquis - anticoagulation currently on hold - plan is to resume when safe to do so and then refer for watchman evaluation   COPD Well compensated at present  HTN Follow blood pressure trend  Chronic diastolic CHF Well compensated at present  Code Status: NO CPR - NO INTUBATION Family Communication: Spoke with family at bedside Disposition Plan: SDU  Consultants: [facility]  [facility]   Procedures: none  Antibiotics: none  DVT prophylaxis: SCDs  Objective: Blood pressure 138/61, pulse 75, temperature 98.2 F (36.8 C), temperature source Oral, resp. rate 17, height 5\' 3"  (1.6 m), weight 69.6 kg (153 lb 7 oz), SpO2 99 %.  Intake/Output Summary (Last 24 hours) at 08/15/15 1158 Last data filed at 08/15/15 1100  Gross per 24 hour  Intake 940.42 ml  Output    825 ml  Net 115.42 ml    Exam: General: No acute respiratory distress Lungs: Clear to auscultation bilaterally without wheezes or crackles Cardiovascular: Regular rate without murmur gallop or rub normal S1 and S2 Abdomen: Nontender, nondistended, soft, bowel sounds positive, no rebound, no ascites, no appreciable mass Extremities: No significant cyanosis, clubbing, or edema bilateral lower extremities  Data Reviewed:  Basic Metabolic Panel:  Recent Labs Lab 08/14/15 1000 08/15/15 0457  NA 139 142  K 4.2 3.7  CL 104 111  CO2 21* 24  GLUCOSE 100* 95  BUN 45* 34*  CREATININE 1.13 1.03  CALCIUM 9.6 8.8*    CBC:  Recent Labs Lab 08/14/15 1000 08/14/15 1904 08/15/15 0457  WBC 11.3* 9.9 8.5  HGB 6.5* 8.0* 7.6*  HCT 19.8* 24.1* 23.2*  MCV 90.8 88.3 87.2  PLT 245 203 203    Liver Function Tests:  Recent Labs Lab 08/14/15 1904 08/15/15 0457  AST 30 29  ALT 17 18  ALKPHOS 31* 28*  BILITOT 1.0 0.5  PROT 5.9* 5.2*  ALBUMIN 3.5 3.3*   Coags:  Recent Labs Lab 08/14/15 1904  INR 1.62*   Cardiac Enzymes:  Recent Labs Lab 08/14/15 1904 08/15/15 0457  TROPONINI 0.59* 0.58*    Recent Results (from the past 240 hour(s))  MRSA PCR Screening     Status: None   Collection Time: 08/14/15  7:34 PM  Result Value Ref Range Status   MRSA by PCR NEGATIVE  NEGATIVE Final    Comment:        The GeneXpert MRSA Assay (FDA approved for NASAL specimens only), is one component of a comprehensive MRSA colonization surveillance program. It is not intended to diagnose MRSA infection nor to guide or monitor treatment for MRSA infections.      Studies:   Recent x-ray studies have been reviewed in detail by the Attending Physician  Scheduled Meds:  Scheduled Meds: . escitalopram  20 mg Oral QHS  . fluticasone furoate-vilanterol  1 puff Inhalation QHS  . lamoTRIgine  150 mg Oral Daily  . sodium chloride flush  3 mL Intravenous Q12H  . tiotropium  18 mcg Inhalation QHS  . verapamil   180 mg Oral QHS    Time spent on care of this patient: 35 mins   MCCLUNG,JEFFREY T , MD   [facility] Office  [phone] Pager - Text Page per Shea Evans as per below:  On-Call/Text Page:      [email or web address]      password [email or web address]  If 7PM-7AM, please contact night-coverage [email or web address] Password [email or web address] 08/15/2015, 11:58 AM   LOS: 1 day

## 2015-08-15 NOTE — Progress Notes (Signed)
    Subjective:  Denies CP or dyspnea   Objective:  Filed Vitals:   08/14/15 2318 08/14/15 2355 08/15/15 0405 08/15/15 0500  BP: 135/63  125/64   Pulse: 86  81   Temp: 98.7 F (37.1 C)  98.1 F (36.7 C)   TempSrc: Oral  Oral   Resp:   19   Height:      Weight:    153 lb 7 oz (69.6 kg)  SpO2:  97% 95%     Intake/Output from previous day:  Intake/Output Summary (Last 24 hours) at 08/15/15 0734 Last data filed at 08/15/15 0600  Gross per 24 hour  Intake 755.42 ml  Output    825 ml  Net -69.58 ml    Physical Exam: Physical exam: Well-developed well-nourished in no acute distress.  Skin is warm and dry.  HEENT is normal.  Neck is supple. No thyromegaly.  Chest is clear to auscultation with normal expansion.  Cardiovascular exam is irregular Abdominal exam nontender or distended. No masses palpated. Extremities show no edema. neuro grossly intact    Lab Results: Basic Metabolic Panel:  Recent Labs  08/14/15 1000 08/15/15 0457  NA 139 142  K 4.2 3.7  CL 104 111  CO2 21* 24  GLUCOSE 100* 95  BUN 45* 34*  CREATININE 1.13 1.03  CALCIUM 9.6 8.8*   CBC:  Recent Labs  08/14/15 1904 08/15/15 0457  WBC 9.9 8.5  HGB 8.0* 7.6*  HCT 24.1* 23.2*  MCV 88.3 87.2  PLT 203 203   Cardiac Enzymes:  Recent Labs  08/14/15 1904 08/15/15 0457  TROPONINI 0.59* 0.58*     Assessment/Plan:  1 Atrial fibrillation-continue verapamil for rate control; apixaban on hold due to active GI bleed. Once GI bleed improved and hemoglobin stable, will resume anticoagulation and refer for Watchman. 2 GI bleed-Continue protonix; given chest pain on admission and mildly elevated troponin, would transfuse to keep Hgb > 9; GI consult pending. 3 Elevated troponin-likely related to demand ischemia in the setting of severe anemia; plan outpt nuclear study when he improves. 4 acute blood loss anemia-transfuse as per #2. 5 H/o diastolic CHF-follow exam and diurese as needed; not  volume overloaded at present. 6 CAD-Would not resume ASA at time of DC given GI bleed and need for anticoagulation; resume statin at DC.  Kirk Ruths 08/15/2015, 7:34 AM

## 2015-08-15 NOTE — Consult Note (Signed)
CARDIOLOGY CONSULT NOTE  Patient ID: Eduardo [name], MRN: [id number], DOB/AGE: 1930/08/21 82 y.o. Admit date: 08/14/2015 Date of Consult: 08/15/2015  Primary Physician: [name], MD Primary Cardiologist: Dr Tamala Julian Referring Physician: Dr Lovena Le  Chief Complaint: GI Bleed Reason for Consultation: Consideration of Watchman  HPI: 82 year old gentleman with a chronic atrial fibrillation since at least 2013. He had been on warfarin for many years and tolerated it well until he had a fall a few years back. He had a large hematoma on the left side when he fell and warfarin was discontinued at that point. He presented in July 2016 with an acute stroke. He was started on Eliquis at that time. He had done well until recently when he developed melena. He is now hospitalized with chest pain and shortness of breath in the setting of an acute GI bleed with anemia. He has required packed red blood cell transfusion and has stabilized at this point. He is going for EGD today. Eliquis has been placed on hold. Consultation requested for consideration of left atrial appendage occlusion in this gentleman with GI bleeding on anticoagulation.  The patient has had no recurrence of chest pain or shortness of breath at rest in the hospital. His wife states that he did very well on warfarin for decades until he had a fall a few years back with extensive bruising and blood loss. He has had no recurrent falls. No other complaints at this time.  Medical History:  Past Medical History  Diagnosis Date  . Anemia 2012    chronic. Acute due to large hematoma 2013.   [place] Kitchen Anxiety   . Atrial fibrillation ([place]) 2012  . CAD (coronary artery disease) 2012  . Hyperlipidemia   . COPD (chronic obstructive pulmonary disease) ([place]) 2013    exertional dyspnea  . Allergy   . Osteoarthritis 2002    spinal stenosis, spondylolisthesis, disc protrusion multilevel in lumbar spine  . Hx of colonic polyps 2001, 2011    adenomatous  2001. HP 2001, 2011.  . Myocardial infarction ([place]) 06/2010.  12/2011.     "twice; 1 day apart; during/after cath" NQMI in setting severe anemia 2013.   . Stroke ([place])   . Neuropathy ([place]) 2014    in feet, likely due to spinal stenosis, DDD, HNP      Surgical History:  Past Surgical History  Procedure Laterality Date  . Appendectomy    . Carpal tunnel release      left  . Mastoidectomy      x3 on right  . Pilonidal cyst / sinus excision    . Knee arthroscopy      left, right knee 10/2009  . Shoulder arthroscopy      right  . Foot fracture surgery      right heel repair  . Nasal septum surgery      nasal septal repair  . Penile prosthesis implant      x 2--got infection after 2nd and had to remove  . Cataract extraction w/ intraocular lens implant  2/13    Dr [name]  . Carpal tunnel release  10/11/11    Dr Rush Barer  . Tonsillectomy and adenoidectomy    . Fracture surgery    . Cardiac catheterization  2012    50% LAD and luminal irreg in RCA, 1/12  Post cath MI from damage  . Penile prosthesis  removal    . Colonoscopy  2001, 02/2010    Dr Deatra Ina.  Home Meds: Prior to Admission medications   Medication Sig Start Date End Date Taking? Authorizing Provider  acetaminophen (TYLENOL) 650 MG CR tablet Take 650 mg by mouth 2 (two) times daily.    Yes Historical Provider, MD  albuterol (PROAIR HFA) 108 (90 BASE) MCG/ACT inhaler Inhale 2 puffs into the lungs every 6 (six) hours as needed for wheezing or shortness of breath. 12/08/14  Yes Collene Gobble, MD  apixaban (ELIQUIS) 5 MG TABS tablet Take 1 tablet (5 mg total) by mouth 2 (two) times daily. 08/02/15  Yes Minna Merritts, MD  aspirin 81 MG tablet Take 81 mg by mouth at bedtime.    Yes Historical Provider, MD  atorvastatin (LIPITOR) 20 MG tablet Take 1 tablet (20 mg total) by mouth daily. Patient taking differently: Take 20 mg by mouth daily at 6 PM.  02/15/15  Yes Belva Crome, MD  B Complex-C (SUPER B COMPLEX  PO) Take 1 tablet by mouth at bedtime.    Yes Historical Provider, MD  BREO ELLIPTA 100-25 MCG/INH AEPB Inhale 1 puff into the lungs at bedtime.  07/20/15  Yes Historical Provider, MD  cetirizine (ZYRTEC) 10 MG tablet Take 10 mg by mouth at bedtime.    Yes Historical Provider, MD  escitalopram (LEXAPRO) 20 MG tablet Take 20 mg by mouth at bedtime.    Yes Historical Provider, MD  fenofibrate 160 MG tablet Take 1 tablet (160 mg total) by mouth daily. Patient taking differently: Take 160 mg by mouth at bedtime.  06/16/15  Yes Minna Merritts, MD  Flavocoxid (LIMBREL) 500 MG CAPS Take 500 mg by mouth 2 (two) times daily. Medical food product   Yes Historical Provider, MD  lamoTRIgine (LAMICTAL) 150 MG tablet Take 150 mg by mouth at bedtime. Reported on 08/14/2015 07/07/15  Yes Historical Provider, MD  Multiple Vitamin (MULTIVITAMIN) tablet Take 1 tablet by mouth at bedtime.    Yes Historical Provider, MD  tiotropium (SPIRIVA) 18 MCG inhalation capsule Place 18 mcg into inhaler and inhale at bedtime.   Yes Historical Provider, MD  Tiotropium Bromide-Olodaterol (STIOLTO RESPIMAT) 2.5-2.5 MCG/ACT AERS Inhale 2 puffs into the lungs daily. Patient taking differently: Inhale 2 puffs into the lungs at bedtime.  06/10/15  Yes Tanda Rockers, MD  triamcinolone cream (KENALOG) 0.1 % Apply 1 application topically 2 (two) times daily.  08/10/15  Yes Historical Provider, MD  TURMERIC PO Take 1,200 mg by mouth 2 (two) times daily.   Yes Historical Provider, MD  verapamil (CALAN-SR) 180 MG CR tablet Take 1 tablet (180 mg total) by mouth at bedtime. 01/21/15  Yes Belva Crome, MD    Inpatient Medications:  . sodium chloride   Intravenous Once  . escitalopram  20 mg Oral QHS  . fluticasone furoate-vilanterol  1 puff Inhalation QHS  . lamoTRIgine  150 mg Oral Daily  . sodium chloride flush  3 mL Intravenous Q12H  . tiotropium  18 mcg Inhalation QHS  . verapamil  180 mg Oral QHS   . sodium chloride 20 mL/hr at 08/15/15  0957  . pantoprozole (PROTONIX) infusion 8 mg/hr (08/15/15 0130)    Allergies:  Allergies  Allergen Reactions  . Penicillins Itching    Has patient had a PCN reaction causing immediate rash, facial/tongue/throat swelling, SOB or lightheadedness with hypotension: Yes Has patient had a PCN reaction causing severe rash involving mucus membranes or skin necrosis: No Has patient had a PCN reaction that required hospitalization No Has patient had a  PCN reaction occurring within the last 10 years: No If all of the above answers are "NO", then may proceed with Cephalosporin use.   . Sulfonamide Derivatives Other (See Comments)    "it affected my kidneys"  . Tape Other (See Comments)    Arms black and blue, tears skin.  Please use "paper" tape    Social History   Social History  . Marital Status: Married    Spouse Name: N/A  . Number of Children: 4  . Years of Education: N/A   Occupational History  . Retired Chief Strategy Officer the job trainin)    Social History Main Topics  . Smoking status: Former Smoker -- 2.50 packs/day for 30 years    Types: Cigarettes    Quit date: 06/11/1978  . Smokeless tobacco: Never Used  . Alcohol Use: No     Comment: QUIT [id number] alcoholic  . Drug Use: No  . Sexual Activity: Not Currently   Other Topics Concern  . Not on file   Social History Narrative   Grew up in [place]   Married---2nd   2 children ([place], [place]  and near [place])--2 stepchildren   Former Smoker--quit in 1980   Alcohol use-no. Quit in 1980--alcoholic      Has living will.   Has designated wife, then step daughter [name] have health care POA.   Would accept resuscitation attempts.   No feeding tube if cognitively unaware.              Family History  Problem Relation Age of Onset  . Diabetes Neg Hx   . Hypertension Neg Hx   . Heart attack Father   . Cancer Mother     ? leukemia     Review of Systems: General: negative for chills, fever, night  sweats or weight changes.  ENT: negative for rhinorrhea or epistaxis Cardiovascular: Positive for chest pain. Negative for palpitations, orthopnea, or PND Dermatological: negative for rash Respiratory: negative for cough or wheezing, positive for shortness of breath GI: Positive for melena and blood in stool. Negative for nausea or vomiting GU: no hematuria, urgency, or frequency Neurologic: negative for visual changes, syncope, headache. Positive for dizziness Heme: Positive for easy bruising Endo: negative for excessive thirst, thyroid disorder, or flushing Musculoskeletal: negative for joint pain or swelling, negative for myalgias  All other systems reviewed and are otherwise negative except as noted above.  Physical Exam: Blood pressure 144/65, pulse 90, temperature 98.6 F (37 C), temperature source Oral, resp. rate 23, height 5\' 3"  (1.6 m), weight 69.6 kg (153 lb 7 oz), SpO2 99 %. Pt is alert and oriented, very pleasant elderly male, in no distress. HEENT: normal Neck: JVP normal. Carotid upstrokes normal without bruits. No thyromegaly. Lungs: equal expansion, clear bilaterally CV: Apex is discrete and nondisplaced, irregularly irregular without murmur or gallop Abd: soft, NT, +BS, no bruit, no hepatosplenomegaly Back: no CVA tenderness Ext: no C/C/E        DP/PT pulses intact and = Skin: warm and dry without rash Neuro: CNII-XII intact             Strength intact = bilaterally    Labs:  Recent Labs  08/14/15 1904 08/15/15 0457  TROPONINI 0.59* 0.58*   Lab Results  Component Value Date   WBC 8.5 08/15/2015   HGB 7.6* 08/15/2015   HCT 23.2* 08/15/2015   MCV 87.2 08/15/2015   PLT 203 08/15/2015    Recent Labs Lab 08/15/15 0457  NA 142  K 3.7  CL 111  CO2 24  BUN 34*  CREATININE 1.03  CALCIUM 8.8*  PROT 5.2*  BILITOT 0.5  ALKPHOS 28*  ALT 18  AST 29  GLUCOSE 95   Lab Results  Component Value Date   CHOL 150 05/12/2015   HDL 44.10 05/12/2015    LDLCALC UNABLE TO CALCULATE IF TRIGLYCERIDE OVER 400 mg/dL 12/31/2014   TRIG * 05/12/2015    791.0 Triglyceride is over 400; calculations on Lipids are invalid.   No results found for: DDIMER  Radiology/Studies:  Dg Chest 2 View  08/14/2015  CLINICAL DATA:  Shortness of breath for 1 week EXAM: CHEST  2 VIEW COMPARISON:  05/23/2015 FINDINGS: There is moderate cardiac enlargement. Diffuse bilateral bronchial wall thickening is identified. Basilar predominant interstitial coarsening is also noted. No superimposed pulmonary edema, airspace consolidation or pleural effusion identified. Spondylosis is present within the thoracic spine. IMPRESSION: 1. No acute findings. 2. Changes of chronic bronchitis. Electronically Signed   By: Kerby Moors M.D.   On: 08/14/2015 10:31    EKG: Atrial fibrillation with left IVCD and nonspecific ST abnormality  Cardiac Studies: 2-D echocardiogram 12/31/2014: Study Conclusions  - Left ventricle: The cavity size was normal. There was severe concentric hypertrophy. Systolic function was normal. The estimated ejection fraction was in the range of 60% to 65%. Wall motion was normal; there were no regional wall motion abnormalities. The study was not technically sufficient to allow evaluation of LV diastolic dysfunction due to atrial fibrillation. - Aortic valve: Trileaflet; moderately thickened, severely calcified leaflets with fixed left coronary cusp. Valve mobility was restricted. There was mild stenosis. There was no regurgitation. Mean gradient (S): 9 mm Hg. Peak gradient (S): 19 mm Hg. Valve area (VTI): 2.07 cm^2. Valve area (Vmax): 1.58 cm^2. Valve area (Vmean): 1.75 cm^2. - Aortic root: The aortic root was normal in size. - Ascending aorta: The ascending aorta was normal in size. - Mitral valve: Calcified annulus. Moderately thickened, moderately calcified leaflets . Mobility was restricted. The findings are consistent with  mild stenosis. There was mild regurgitation. Valve area by continuity equation (using LVOT flow): 2.59 cm^2. - Left atrium: The atrium was severely dilated. - Right ventricle: Systolic function was normal. - Right atrium: The atrium was mildly dilated. - Tricuspid valve: There was mild regurgitation. - Pulmonary arteries: Systolic pressure was within the normal range. PA peak pressure: 34 mm Hg (S). - Pericardium, extracardiac: There was no pericardial effusion.  ASSESSMENT AND PLAN:  I have seen Eduardo [name] is a 82 y.o. male in hospital consultation today who has been referred by Dr Lovena Le for a Watchman left atrial appendage closure device.  He has a history of chronic atrial fibrillation and stroke that occurred last year off of anticoagulation.  This patients CHA2DS2-VASc Score and unadjusted Ischemic Stroke Rate (% per year) is equal to 9.7 % stroke rate/year from a score of 6 which necessitates long term oral anticoagulation to prevent stroke. HasBled score is 5.  Unfortunately, He is not felt to be a long term Warfarin candidate secondary to recurrent bleeding.  The patients chart has been reviewed and I along with their referring cardiologist feel that they would be a candidate for short term oral anticoagulation.  Procedural risks for the Watchman implant have been reviewed with the patient including a 1% risk of stroke, 2% risk of perforation, 0.1% risk of device embolization.  Given the patient's poor candidacy for long-term oral anticoagulation, ability to tolerate short term oral  anticoagulation, I have recommended consideration of the watchman left atrial appendage closure system.   At this time, the patient and his wife are reluctant to consider an invasive procedure such as watchman. They do understand the rationale for this procedure and also understand his ongoing bleeding risk with a background of chronic oral anticoagulation. His wife is particularly concerned about his  advanced age. I have reviewed the procedural details and expected recovery in depth with both of them today. I think it would be worthwhile for them to follow-up with Chanetta Marshall, NP in the office in a few weeks to obtain more information on the procedure. At that point, they will have results from an EGD as well as formal recommendations from gastroenterology. Despite his advanced age, he seems to be in reasonably good condition and I don't think he would be at excessive risk of undergoing general anesthesia for Watchman implantation.   They seem to have a preference of returning to warfarin since he had done well on this for so many years prior to his fall. I will leave this to the discretion of the primary cardiology team and await formal GI recommendations for when this patient can resume oral anticoagulation.  Again, will arrange outpatient FU with Chanetta Marshall, NP and Dr Tamala Julian to provide more information on Watchman and assess the patient's clinical status at the time of follow-up.   SignedSherren Mocha MD, [facility] 08/15/2015, 12:27 PM

## 2015-08-15 NOTE — Op Note (Addendum)
[facility] [place], [id number]   ENDOSCOPY PROCEDURE REPORT  PATIENT: Rodrick, Priestly  MR#: [id number] BIRTHDATE: [birth year] , 33  yrs. old GENDER: male ENDOSCOPIST: [name], MD, [facility] PROCEDURE DATE:  08/15/2015 PROCEDURE:  EGD w/ control of bleeding ASA CLASS:     Class III INDICATIONS:  diagnostic procedure and melena. MEDICATIONS: Fentanyl 25 mcg IV and Versed 3 mg IV TOPICAL ANESTHETIC: Cetacaine Spray  DESCRIPTION OF PROCEDURE: After the risks benefits and alternatives of the procedure were thoroughly explained, informed consent was obtained.  The PENTAX GASTOROSCOPE [id number] endoscope was introduced through the mouth and advanced to the second portion of the duodenum , Without limitations.  The instrument was slowly withdrawn as the mucosa was fully examined.    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 Retroflexed views revealed as previously described.     The scope was then withdrawn from the patient and the procedure completed.  COMPLICATIONS: There were no immediate complications.  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 warfarin - wife concerned about risks of Watchman though that may be a good option - I told her would definitely do that if more bleding after this Tx will see tomorrow and advise dc/f/u plans likely Hgb through our office and then f/u to be determined (GI)   eSigned:  [name], MD, [facility] 08/15/2015 3:17 PM Revised: 08/15/2015 3:17  PM  CC: The patient

## 2015-08-15 NOTE — Consult Note (Signed)
[facility] Consult: 8:24 AM 08/15/2015  LOS: 1 day    Referring Provider: Dr Thereasa Solo  Primary Care Physician:  [name], MD Primary Gastroenterologist:  Dr. Deatra Ina     Reason for Consultation:  Blood loss anemia   HPI: Eduardo [name] is a 82 y.o. male.  On Eliquis as well as  83 ASA for A fib since acute but small, embolic CVA in [id number].  hHad previously taken Coumadin but was not taking in 12/2014.  Post CVA cognitive/linguistic issues that have resolved.  CAD with MI 2012. COPD.  Acute, transfusion-requiring anemia on chronic iron deficiency anemia associated with large, trauma related hematoma in 12/2011.  Non Q MI a/w severe anemia at the time and Coumadin discontinued.  Hx diastolic dysfunction, though on 2011 and 2016 echos unable to assess diastolic function due to a fib but LVEF 60 to 65%.  Equilibrium issues and falls due to poor balance, LE numbness and poor endurance.   Spinal stenosis and back pain but no NSAIDs in use. Hyperlipidemia.   02/2010 Colonoscopy.  For hx of adenomatous polyp 2001, Mother hx colon cancer.  Diminutive rectal polyp (hyperplastic).   2001 Colonoscopy pathology only: HP and adenomatus polyp.  Note per cardiologist Dr Rockey Situ in 04/2015 states "If he does have a significant bleed in the future, perhaps would be a candidate for a Watchman deviceto close the left atrial appendage".   Admitted from ED yesterday AM with increased above baseline DOE and exertional chest pain in previous 48 hours.  7 to 10 days dark stools.  Stool norm is formed, brown, daily.  Started to turn dark and formed then maroon and loose up to 3 per day.  No nausea, heartburn, anorexia, abdominal pain, weight fluctuation, cough.  No dizziness or falls.  Hgb 6.5, MCV 90.   Hgb 11.5 to 12.5 from 12/2014 -  05/2015.   Platelets WNL.  Coags 19/1.6 BUN up to 45.  Creatinine normal at 1.1.  Protonix gtt initiated.  Eliquis held, last dose was 3/5 in AM. .  S/p PRBC x2.        Past Medical History  Diagnosis Date  . Anemia 2012    chronic. Acute due to large hematoma 2013.   Marland Kitchen Anxiety   . Atrial fibrillation ([place]) 2012  . CAD (coronary artery disease) 2012  . Hyperlipidemia   . COPD (chronic obstructive pulmonary disease) ([place]) 2013    exertional dyspnea  . Allergy   . Osteoarthritis   . Hx of colonic polyps 2001, 2011    adenomatous 2001. HP 2001, 2011.  . Myocardial infarction ([place]) 06/2010    "twice; 1 day apart; during/after cath" NQMI in setting severe anemia 2013.   . Stroke [facility])     Past Surgical History  Procedure Laterality Date  . Appendectomy    . Carpal tunnel release      left  . Mastoidectomy      x3 on right  . Pilonidal cyst / sinus excision    . Knee arthroscopy  left, right knee 10/2009  . Shoulder arthroscopy      right  . Foot fracture surgery      right heel repair  . Nasal septum surgery      nasal septal repair  . Penile prosthesis implant      x 2--got infection after 2nd and had to remove  . Cataract extraction w/ intraocular lens implant  2/13    Dr [name]  . Carpal tunnel release  10/11/11    Dr Rush Barer  . Tonsillectomy and adenoidectomy    . Fracture surgery    . Cardiac catheterization  2012    50% LAD and luminal irreg in RCA, 1/12  Post cath MI from damage  . Penile prosthesis  removal    . Colonoscopy  2001, 02/2010    Dr Deatra Ina.     Prior to Admission medications   Medication Sig Start Date End Date Taking? Authorizing Provider  acetaminophen (TYLENOL) 650 MG CR tablet Take 650 mg by mouth 2 (two) times daily.    Yes Historical Provider, MD  albuterol (PROAIR HFA) 108 (90 BASE) MCG/ACT inhaler Inhale 2 puffs into the lungs every 6 (six) hours as needed for wheezing or shortness of breath. 12/08/14  Yes Collene Gobble, MD  apixaban (ELIQUIS) 5 MG TABS tablet Take 1 tablet (5 mg total) by mouth 2 (two) times daily. 08/02/15  Yes Minna Merritts, MD  aspirin 81 MG tablet Take 81 mg by mouth at bedtime.    Yes Historical Provider, MD  atorvastatin (LIPITOR) 20 MG tablet Take 1 tablet (20 mg total) by mouth daily. Patient taking differently: Take 20 mg by mouth daily at 6 PM.  02/15/15  Yes Belva Crome, MD  B Complex-C (SUPER B COMPLEX PO) Take 1 tablet by mouth at bedtime.    Yes Historical Provider, MD  BREO ELLIPTA 100-25 MCG/INH AEPB Inhale 1 puff into the lungs at bedtime.  07/20/15  Yes Historical Provider, MD  cetirizine (ZYRTEC) 10 MG tablet Take 10 mg by mouth at bedtime.    Yes Historical Provider, MD  escitalopram (LEXAPRO) 20 MG tablet Take 20 mg by mouth at bedtime.    Yes Historical Provider, MD  fenofibrate 160 MG tablet Take 1 tablet (160 mg total) by mouth daily. Patient taking differently: Take 160 mg by mouth at bedtime.  06/16/15  Yes Minna Merritts, MD  Flavocoxid (LIMBREL) 500 MG CAPS Take 500 mg by mouth 2 (two) times daily. Medical food product   Yes Historical Provider, MD  lamoTRIgine (LAMICTAL) 150 MG tablet Take 150 mg by mouth at bedtime. Reported on 08/14/2015 07/07/15  Yes Historical Provider, MD  Multiple Vitamin (MULTIVITAMIN) tablet Take 1 tablet by mouth at bedtime.    Yes Historical Provider, MD  tiotropium (SPIRIVA) 18 MCG inhalation capsule Place 18 mcg into inhaler and inhale at bedtime.   Yes Historical Provider, MD  Tiotropium Bromide-Olodaterol (STIOLTO RESPIMAT) 2.5-2.5 MCG/ACT AERS Inhale 2 puffs into the lungs daily. Patient taking differently: Inhale 2 puffs into the lungs at bedtime.  06/10/15  Yes Tanda Rockers, MD  triamcinolone cream (KENALOG) 0.1 % Apply 1 application topically 2 (two) times daily.  08/10/15  Yes Historical Provider, MD  TURMERIC PO Take 1,200 mg by mouth 2 (two) times daily.   Yes Historical Provider, MD  verapamil (CALAN-SR) 180 MG CR tablet Take  1 tablet (180 mg total) by mouth at bedtime. 01/21/15  Yes Belva Crome, MD  Scheduled Meds: . fluticasone furoate-vilanterol  1 puff Inhalation QHS  . lamoTRIgine  150 mg Oral Daily  . sodium chloride flush  3 mL Intravenous Q12H  . tiotropium  18 mcg Inhalation QHS  . verapamil  180 mg Oral Daily   Infusions: . pantoprozole (PROTONIX) infusion 8 mg/hr (08/15/15 0130)   PRN Meds: acetaminophen **OR** acetaminophen, albuterol, morphine injection, ondansetron **OR** ondansetron (ZOFRAN) IV   Allergies as of 08/14/2015 - Review Complete 08/14/2015  Allergen Reaction Noted  . Penicillins Itching   . Sulfonamide derivatives Other (See Comments)   . Tape Other (See Comments) 08/14/2015    Family History  Problem Relation Age of Onset  . Diabetes Neg Hx   . Hypertension Neg Hx   . Heart attack Father   . Cancer Mother     ? leukemia    Social History   Social History  . Marital Status: Married    Spouse Name: N/A  . Number of Children: 4  . Years of Education: N/A   Occupational History  . Retired Chief Strategy Officer the job trainin)    Social History Main Topics  . Smoking status: Former Smoker -- 2.50 packs/day for 30 years    Types: Cigarettes    Quit date: 06/11/1978  . Smokeless tobacco: Never Used  . Alcohol Use: No     Comment: QUIT [id number] alcoholic  . Drug Use: No  . Sexual Activity: Not Currently   Other Topics Concern  . Not on file   Social History Narrative   Grew up in [place]   Married---2nd   2 children ([place], [place]  and near [place])--2 stepchildren   Former Smoker--quit in 1980   Alcohol use-no. Quit in 1980--alcoholic      Has living will.   Has designated wife, then step daughter [name] have health care POA.   Would accept resuscitation attempts.   No feeding tube if cognitively unaware.             REVIEW OF SYSTEMS: Constitutional:  No weight loss, no profound fatigue ENT:  No nose bleeds Pulm:  Per HPI.  No  cough, no pleuritic pain.  CV:  No palpitations, no LE edema.  GU:  No hematuria, no frequency GI:  Per HPI.  No hx liver or GB disease Heme:  Per HPI.  No excessive bruising or bleeding.    Transfusions:  In 2013 and last night.  Neuro:  + numbness in feet, balance compromised: uses cane when leaves his home.  Balances on furniture/walls at home as needed.  No recent falls.  Derm:  No itching, no rash or sores.  Endocrine:  No sweats or chills.  No polyuria or dysuria Immunization:  Up to date on multiple vaccines.  Reviewed record Travel:  None beyond local counties in last few months.    PHYSICAL EXAM: Vital signs in last 24 hours: Filed Vitals:   08/15/15 0405 08/15/15 0801  BP: 125/64 138/61  Pulse: 81 75  Temp: 98.1 F (36.7 C) 98.2 F (36.8 C)  Resp: 19 17   Wt Readings from Last 3 Encounters:  08/15/15 69.6 kg (153 lb 7 oz)  06/22/15 71.487 kg (157 lb 9.6 oz)  06/10/15 71.215 kg (157 lb)    General: pleasant, comfortable WM.  No distress.  Though mild pallor, does not look ill Head:  No swelling, asymmetry or trauma  Eyes:  No conj pallor.  EOMI Ears:  Not HOH  Nose:  No congestion or discharge  Mouth:  Good dental repair. Bridge in upper jaw.  Pink, moist and clear oral MM.  Tongue midline Neck:  No mass, no TMG, no JVD Lungs:  Clear bil.  No cough or SOB Heart: Irreg/irreg, rate controlled Abdomen:  Soft, NT, ND.  No bruits, masses, HSM, hernias.  Active BS.   Rectal: deferred.     Musc/Skeltl: no joint redness, swelling, gross deformity.  Extremities:  No CCE.    Neurologic:  Oriented x 3.  Moves all 4 limbs, strength not tested.  No tremor Skin:  No telangectasia.  No sores Tattoos:  none Nodes:  No cervical adenopathy   Psych:  Pleasant, calm, appropriate.   Good historian.   Intake/Output from previous day: 03/05 0701 - 03/06 0700 In: 755.4 [I.V.:420.4; Blood:335] Out: 825 [Urine:825] Intake/Output this shift:    LAB RESULTS:  Recent Labs   08/14/15 1000 08/14/15 1904 08/15/15 0457  WBC 11.3* 9.9 8.5  HGB 6.5* 8.0* 7.6*  HCT 19.8* 24.1* 23.2*  PLT 245 203 203   BMET Lab Results  Component Value Date   NA 142 08/15/2015   NA 139 08/14/2015   NA 141 05/12/2015   K 3.7 08/15/2015   K 4.2 08/14/2015   K 4.5 05/12/2015   CL 111 08/15/2015   CL 104 08/14/2015   CL 105 05/12/2015   CO2 24 08/15/2015   CO2 21* 08/14/2015   CO2 30 05/12/2015   GLUCOSE 95 08/15/2015   GLUCOSE 100* 08/14/2015   GLUCOSE 83 05/12/2015   BUN 34* 08/15/2015   BUN 45* 08/14/2015   BUN 28* 05/12/2015   CREATININE 1.03 08/15/2015   CREATININE 1.13 08/14/2015   CREATININE 1.05 05/12/2015   CALCIUM 8.8* 08/15/2015   CALCIUM 9.6 08/14/2015   CALCIUM 10.2 05/12/2015   LFT  Recent Labs  08/14/15 1904 08/15/15 0457  PROT 5.9* 5.2*  ALBUMIN 3.5 3.3*  AST 30 29  ALT 17 18  ALKPHOS 31* 28*  BILITOT 1.0 0.5  BILIDIR 0.1  --   IBILI 0.9  --    PT/INR Lab Results  Component Value Date   INR 1.62* 08/14/2015   INR 1.13 12/31/2014   INR 2.20* 12/27/2011   Hepatitis Panel No results for input(s): HEPBSAG, HCVAB, HEPAIGM, HEPBIGM in the last 72 hours. C-Diff No components found for: CDIFF Lipase  No results found for: LIPASE  Drugs of Abuse     Component Value Date/Time   LABOPIA NONE DETECTED 12/31/2014 0037   COCAINSCRNUR NONE DETECTED 12/31/2014 0037   LABBENZ NONE DETECTED 12/31/2014 0037   AMPHETMU NONE DETECTED 12/31/2014 0037   THCU NONE DETECTED 12/31/2014 0037   LABBARB NONE DETECTED 12/31/2014 0037     RADIOLOGY STUDIES: Dg Chest 2 View  08/14/2015  CLINICAL DATA:  Shortness of breath for 1 week EXAM: CHEST  2 VIEW COMPARISON:  05/23/2015 FINDINGS: There is moderate cardiac enlargement. Diffuse bilateral bronchial wall thickening is identified. Basilar predominant interstitial coarsening is also noted. No superimposed pulmonary edema, airspace consolidation or pleural effusion identified. Spondylosis is present  within the thoracic spine. IMPRESSION: 1. No acute findings. 2. Changes of chronic bronchitis. Electronically Signed   By: Kerby Moors M.D.   On: 08/14/2015 10:31    ENDOSCOPIC STUDIES: Per HPI  IMPRESSION:   *  Acute blood loss anemia in setting of UGIB with melena in pt on chronic AC (Eliquis) for hx A fib, CVA. S/p PRBC x 2 and PPI gtt in place.   *  Hx anxiety.  Pt and wife concerned that he is not receiving Lexapro and Lamictal prescribed for this issue and current situation makes him more anxious.   *  CAD.  Acute MI 06/2010.  NQMI in setting of transfusion requiring anemia 12/2011. Anginal sxs for ~ 48 hours PTA: Troponin slightly elevated at 0.4, then 0.5 x 2.  Receiving Verapamil.  Dr Stanford Breed plans to resume Abixiban when safe but refer pt for Watchman atrial appendage occlusion.     PLAN:     *  EGD.  2 PM.  Last dose Eliquis was yesterday AM.  Ok to get AM meds now, including Lexapro.    *  BID CBC.    Azucena Freed  08/15/2015, 8:24 AM Pager: [phone]       [facility] Attending   I have taken an interval history, reviewed the chart and examined the patient. I agree with the Advanced Practitioner's note, impression and recommendations. Patient was seen and examined prior to EGD.  [name], MD, [facility] [phone] (pager) [phone] after 5 PM, weekends and holidays  08/15/2015 3:31 PM

## 2015-08-16 ENCOUNTER — Encounter (HOSPITAL_COMMUNITY): Payer: Self-pay | Admitting: Physician Assistant

## 2015-08-16 DIAGNOSIS — J438 Other emphysema: Secondary | ICD-10-CM

## 2015-08-16 DIAGNOSIS — D62 Acute posthemorrhagic anemia: Secondary | ICD-10-CM | POA: Diagnosis not present

## 2015-08-16 DIAGNOSIS — I5032 Chronic diastolic (congestive) heart failure: Secondary | ICD-10-CM

## 2015-08-16 DIAGNOSIS — I10 Essential (primary) hypertension: Secondary | ICD-10-CM

## 2015-08-16 DIAGNOSIS — R0789 Other chest pain: Secondary | ICD-10-CM | POA: Diagnosis not present

## 2015-08-16 DIAGNOSIS — I482 Chronic atrial fibrillation: Secondary | ICD-10-CM | POA: Diagnosis not present

## 2015-08-16 DIAGNOSIS — K31811 Angiodysplasia of stomach and duodenum with bleeding: Secondary | ICD-10-CM | POA: Diagnosis present

## 2015-08-16 DIAGNOSIS — K921 Melena: Secondary | ICD-10-CM | POA: Diagnosis not present

## 2015-08-16 LAB — CBC
HEMATOCRIT: 25.1 % — AB (ref 39.0–52.0)
HEMOGLOBIN: 8.2 g/dL — AB (ref 13.0–17.0)
MCH: 29.1 pg (ref 26.0–34.0)
MCHC: 32.7 g/dL (ref 30.0–36.0)
MCV: 89 fL (ref 78.0–100.0)
Platelets: 188 10*3/uL (ref 150–400)
RBC: 2.82 MIL/uL — ABNORMAL LOW (ref 4.22–5.81)
RDW: 17.9 % — AB (ref 11.5–15.5)
WBC: 7.4 10*3/uL (ref 4.0–10.5)

## 2015-08-16 LAB — COMPREHENSIVE METABOLIC PANEL
ALBUMIN: 3.1 g/dL — AB (ref 3.5–5.0)
ALK PHOS: 28 U/L — AB (ref 38–126)
ALT: 17 U/L (ref 17–63)
AST: 30 U/L (ref 15–41)
Anion gap: 10 (ref 5–15)
BUN: 24 mg/dL — AB (ref 6–20)
CALCIUM: 9 mg/dL (ref 8.9–10.3)
CHLORIDE: 106 mmol/L (ref 101–111)
CO2: 26 mmol/L (ref 22–32)
CREATININE: 0.92 mg/dL (ref 0.61–1.24)
GFR calc Af Amer: >60 mL/min (ref >60)
GFR calc non Af Amer: >60 mL/min (ref >60)
GLUCOSE: 92 mg/dL (ref 65–99)
Potassium: 4.3 mmol/L (ref 3.5–5.1)
Sodium: 142 mmol/L (ref 135–145)
Total Bilirubin: 0.5 mg/dL (ref 0.3–1.2)
Total Protein: 5.2 g/dL — ABNORMAL LOW (ref 6.5–8.1)

## 2015-08-16 LAB — HEMOGLOBIN: Hemoglobin: 9.3 g/dL — ABNORMAL LOW (ref 13.0–17.0)

## 2015-08-16 NOTE — Care Management Important Message (Signed)
Important Message  Patient Details  Name: WENCES DELPOZO MRN: [id number] Date of Birth: 01-06-1931   Medicare Important Message Given:  Yes    Fabiana Dromgoole, Leroy Sea 08/16/2015, 8:22 AM

## 2015-08-16 NOTE — Progress Notes (Signed)
[facility] TEAM 1 - Stepdown/ICU TEAM Progress Note  Eduardo [name] [id number] DOB: 1931/02/10 DOA: 08/14/2015 PCP: [name], MD  Admit HPI / Brief Narrative: 82 yo WM PMHx  A fib on Eliquis, COPD, and HTN who presented to the ED with exertional dyspnea and chest pressure. He had been having melena for a few days and was found to have a hemoglobin of 6.5 with a baseline of 12.7 3 months prior. His troponin was elevated at 0.45 w/ depression and TWI in V5-6.   HPI/Subjective: 3/7  A/O 4, NAD  Assessment/Plan:  UGIB on anticoag/Bleeding AVM  - holding anticoag  -Bleeding AVM see EGD report below -Per GI if patient H/H stable overnight DC in a.m.   Acute blood loss anemia  -Goal will be to keep Hgb 8-9  - transfuse 1 additional unit (total of 4 thus far) and recheck Hgb this PM  -Cardiology requests transfuse for hemoglobin <9   Chest pain - Hx of CAD -Cards suggests no ASA - Cards following - no plans for further CAD evaluation at this time   Afib  -CHA2DS2-VASc 6 - on chronic eliquis  -If no bleed overnight would restart Apixaban in A.m.  -Cardiology plans to place Watchman device as outpatient  - plan is to resume when safe to do so and then refer for watchman evaluation   COPD Well compensated at present  HTN -Fairly well controlled  -Continue verapamil 180 mg daily  Chronic diastolic CHF -Well compensated at present    Code Status: FULL Family Communication: no family present at time of exam Disposition Plan: 3/8    Consultants: [facility]  [facility]   Procedure/Significant Events: 3/6 EGD;-2 small AVM's in gastric body, greater curve - one actively bleeding - both Tx w/ APC and the actively bleeding one was clipped post APC Tx - gastric settings used    Culture NA  Antibiotics: NA  DVT prophylaxis: SCD   Devices NA   LINES / TUBES:  NA    Continuous Infusions:   Objective: VITAL SIGNS: Temp: 98.8 F (37.1  C) (03/07 2006) Temp Source: Oral (03/07 2006) BP: 130/67 mmHg (03/07 1617) Pulse Rate: 91 (03/07 1616) SPO2; FIO2:   Intake/Output Summary (Last 24 hours) at 08/16/15 2153 Last data filed at 08/16/15 1930  Gross per 24 hour  Intake    960 ml  Output   1100 ml  Net   -140 ml     Exam: General: A/O 4, NAD, No acute respiratory distress Eyes: Negative headache, negative scleral hemorrhage ENT: Negative Runny nose, negative gingival bleeding, Neck:  Negative scars, masses, torticollis, lymphadenopathy, JVD Lungs: Clear to auscultation bilaterally without wheezes or crackles Cardiovascular: Irregular irregular rhythm and rate, without murmur gallop or rub normal S1 and S2 Abdomen:negative abdominal pain, nondistended, positive soft, bowel sounds, no rebound, no ascites, no appreciable mass Extremities: No significant cyanosis, clubbing, or edema bilateral lower extremities Psychiatric:  Negative depression, negative anxiety, negative fatigue, negative mania Neurologic:  Cranial nerves II through XII intact, tongue/uvula midline, all extremities muscle strength 5/5, sensation intact throughout,negative dysarthria, negative expressive aphasia, negative receptive aphasia.   Data Reviewed: Basic Metabolic Panel:  Recent Labs Lab 08/14/15 1000 08/15/15 0457 08/16/15 0408  NA 139 142 142  K 4.2 3.7 4.3  CL 104 111 106  CO2 21* 24 26  GLUCOSE 100* 95 92  BUN 45* 34* 24*  CREATININE 1.13 1.03 0.92  CALCIUM 9.6 8.8* 9.0   Liver Function Tests:  Recent Labs Lab 08/14/15 1904 08/15/15 0457 08/16/15 0408  AST 30 29 30   ALT 17 18 17   ALKPHOS 31* 28* 28*  BILITOT 1.0 0.5 0.5  PROT 5.9* 5.2* 5.2*  ALBUMIN 3.5 3.3* 3.1*   No results for input(s): LIPASE, AMYLASE in the last 168 hours. No results for input(s): AMMONIA in the last 168 hours. CBC:  Recent Labs Lab 08/14/15 1000 08/14/15 1904 08/15/15 0457 08/15/15 1544 08/16/15 0408 08/16/15 1850  WBC 11.3* 9.9 8.5  7.7 7.4  --   HGB 6.5* 8.0* 7.6* 9.1* 8.2* 9.3*  HCT 19.8* 24.1* 23.2* 27.9* 25.1*  --   MCV 90.8 88.3 87.2 88.9 89.0  --   PLT 245 203 203 192 188  --    Cardiac Enzymes:  Recent Labs Lab 08/14/15 1904 08/15/15 0457  TROPONINI 0.59* 0.58*   BNP (last 3 results) No results for input(s): BNP in the last 8760 hours.  ProBNP (last 3 results) No results for input(s): PROBNP in the last 8760 hours.  CBG: No results for input(s): GLUCAP in the last 168 hours.  Recent Results (from the past 240 hour(s))  MRSA PCR Screening     Status: None   Collection Time: 08/14/15  7:34 PM  Result Value Ref Range Status   MRSA by PCR NEGATIVE NEGATIVE Final    Comment:        The GeneXpert MRSA Assay (FDA approved for NASAL specimens only), is one component of a comprehensive MRSA colonization surveillance program. It is not intended to diagnose MRSA infection nor to guide or monitor treatment for MRSA infections.      Studies:  Recent x-ray studies have been reviewed in detail by the Attending Physician  Scheduled Meds:  Scheduled Meds: . escitalopram  20 mg Oral QHS  . fluticasone furoate-vilanterol  1 puff Inhalation QHS  . lamoTRIgine  150 mg Oral Daily  . pantoprazole  40 mg Oral QAC breakfast  . sodium chloride flush  3 mL Intravenous Q12H  . tiotropium  18 mcg Inhalation QHS  . verapamil  180 mg Oral QHS    Time spent on care of this patient: 40 mins   WOODS, Geraldo Docker , MD  [facility] Office  [phone] Pager [phone]  On-Call/Text Page:      [email or web address]      password [email or web address]  If 7PM-7AM, please contact night-coverage [email or web address] Password [email or web address] 08/16/2015, 9:53 PM   LOS: 2 days   Care during the described time interval was provided by me .  I have reviewed this patient's available data, including medical history, events of note, physical examination, and all test results as part of my evaluation. I have personally reviewed and interpreted all  radiology studies.   [name], MD [phone] Pager

## 2015-08-16 NOTE — Progress Notes (Signed)
    Subjective:  Denies CP or dyspnea   Objective:  Filed Vitals:   08/15/15 1943 08/15/15 2100 08/15/15 2343 08/16/15 0345  BP: 131/77  117/76   Pulse:      Temp: 98.5 F (36.9 C)  98.2 F (36.8 C) 98.6 F (37 C)  TempSrc: Oral  Oral Oral  Resp: 21 25 17    Height:      Weight:      SpO2: 98%  98% 96%    Intake/Output from previous day:  Intake/Output Summary (Last 24 hours) at 08/16/15 0719 Last data filed at 08/16/15 0349  Gross per 24 hour  Intake    710 ml  Output    775 ml  Net    -65 ml    Physical Exam: Physical exam: Well-developed well-nourished in no acute distress.  Skin is warm and dry.  HEENT is normal.  Neck is supple. No thyromegaly.  Chest is clear to auscultation with normal expansion.  Cardiovascular exam is irregular Abdominal exam nontender or distended. No masses palpated. Extremities show no edema. neuro grossly intact    Lab Results: Basic Metabolic Panel:  Recent Labs  08/15/15 0457 08/16/15 0408  NA 142 142  K 3.7 4.3  CL 111 106  CO2 24 26  GLUCOSE 95 92  BUN 34* 24*  CREATININE 1.03 0.92  CALCIUM 8.8* 9.0   CBC:  Recent Labs  08/15/15 1544 08/16/15 0408  WBC 7.7 7.4  HGB 9.1* 8.2*  HCT 27.9* 25.1*  MCV 88.9 89.0  PLT 192 188   Cardiac Enzymes:  Recent Labs  08/14/15 1904 08/15/15 0457  TROPONINI 0.59* 0.58*     Assessment/Plan:  1 Atrial fibrillation-continue verapamil for rate control; apixaban on hold due to active GI bleed. Will resume when ok with GI. I favor continuing apixaban instead of resuming coumadin. Watchman can be considered as outpatient. 2 GI bleed-Continue protonix; EGD results noted-related to AVMs which have been addressed; would continue to transfuse to keep Hgb > 9. 3 Elevated troponin-likely related to demand ischemia in the setting of severe anemia; plan outpt nuclear study when he improves. 4 acute blood loss anemia-transfuse as per #2. 5 H/o diastolic CHF-follow exam and  diurese as needed; not volume overloaded at present. 6 CAD-Would not resume ASA at time of DC given GI bleed and need for anticoagulation; resume statin at DC. Patient can be transferred to telemetry from cardiac standpoint. Kirk Ruths 08/16/2015, 7:19 AM

## 2015-08-16 NOTE — Progress Notes (Signed)
          Daily Rounding Note  08/16/2015, 8:36 AM  LOS: 2 days   SUBJECTIVE:       Cleaned up his HH breakfast plate.  No BM since early in admission, none yesterday or today.  Feels better, well enough to go home.   No chest pain or SOB.  No dizziness.  OBJECTIVE:         Vital signs in last 24 hours:    Temp:  [97.4 F (36.3 C)-99 F (37.2 C)] 98.4 F (36.9 C) (03/07 0808) Pulse Rate:  [72-91] 81 (03/07 0808) Resp:  [14-25] 18 (03/07 0808) BP: (117-188)/(51-79) 129/72 mmHg (03/07 0808) SpO2:  [84 %-100 %] 98 % (03/07 0808)   Filed Weights   08/14/15 0951 08/14/15 1800 08/15/15 0500  Weight: 71.396 kg (157 lb 6.4 oz) 70.6 kg (155 lb 10.3 oz) 69.6 kg (153 lb 7 oz)   General: looks well.  comfortable   Heart: regular at present. Chest: clear bil.  No labored breathing.  No cough Abdomen: soft, NT, ND.   Active BS  Extremities: no CCE Neuro/Psych:  Pleasant, calm, cooperative.  No gross deficits.   Intake/Output from previous day: 03/06 0701 - 03/07 0700 In: 710 [P.O.:120; I.V.:255; Blood:335] Out: [id number] [Urine:1075]  Lab Results:  Recent Labs  08/15/15 0457 08/15/15 1544 08/16/15 0408  WBC 8.5 7.7 7.4  HGB 7.6* 9.1* 8.2*  HCT 23.2* 27.9* 25.1*  PLT 203 192 188      ASSESMENT:   * Acute blood loss anemia in setting of UGIB with melena in pt on chronic AC (Eliquis) for hx A fib, CVA. Eliquis on hols S/p PRBC x 3.    08/15/15 EGD: 2 small gastric AVMs, 1 actively bleeding.  Both treated with APC ablation, clipping of the bleeder.   PPI drip replaced with once daily oral Protonix. (not on PPI at home)  * CAD. Acute MI 06/2010. NQMI in setting of transfusion requiring anemia 12/2011. Anginal sxs for ~ 48 hours PTA: Troponin slightly elevated at 0.4, then 0.5 x 2. Dr Stanford Breed plans to resume Abixiban when safe but refer pt for Watchman atrial appendage occlusion procedure.    PLAN   *  CBC in AM.  *  Resume  Eliquis today or wait another 24 hours?  *  Duration of PPI treatment?, was not taking and had no GI sxs PTA.     Azucena Freed  08/16/2015, 8:36 AM Pager: [phone]    [facility] Attending   I have taken an interval history, reviewed the chart and examined the patient. I agree with the Advanced Practitioner's note, impression and recommendations.   No melena/stools Feels better hgb down though  Will check Hgb and 1 U RBC if < 9 If looking ok and no good signs of bleeding will restart apixaban tomorrow and probably could go home Would leave on a daily PPI to reduce bleeding risk on apixaban  [name], MD, [facility] [phone] (pager) [phone] after 5 PM, weekends and holidays  08/16/2015 5:14 PM

## 2015-08-17 DIAGNOSIS — K31811 Angiodysplasia of stomach and duodenum with bleeding: Secondary | ICD-10-CM | POA: Diagnosis not present

## 2015-08-17 DIAGNOSIS — I482 Chronic atrial fibrillation: Secondary | ICD-10-CM | POA: Diagnosis not present

## 2015-08-17 DIAGNOSIS — K921 Melena: Secondary | ICD-10-CM | POA: Diagnosis not present

## 2015-08-17 DIAGNOSIS — D62 Acute posthemorrhagic anemia: Secondary | ICD-10-CM | POA: Diagnosis not present

## 2015-08-17 LAB — FERRITIN: Ferritin: 26 ng/mL (ref 24–336)

## 2015-08-17 LAB — CBC
HCT: 26 % — ABNORMAL LOW (ref 39.0–52.0)
HCT: 28.6 % — ABNORMAL LOW (ref 39.0–52.0)
Hemoglobin: 8.5 g/dL — ABNORMAL LOW (ref 13.0–17.0)
Hemoglobin: 9.8 g/dL — ABNORMAL LOW (ref 13.0–17.0)
MCH: 29.2 pg (ref 26.0–34.0)
MCH: 30.7 pg (ref 26.0–34.0)
MCHC: 32.7 g/dL (ref 30.0–36.0)
MCHC: 34.3 g/dL (ref 30.0–36.0)
MCV: 89.3 fL (ref 78.0–100.0)
MCV: 89.7 fL (ref 78.0–100.0)
PLATELETS: 209 10*3/uL (ref 150–400)
PLATELETS: 203 10*3/uL (ref 150–400)
RBC: 2.91 MIL/uL — AB (ref 4.22–5.81)
RBC: 3.19 MIL/uL — AB (ref 4.22–5.81)
RDW: 18 % — AB (ref 11.5–15.5)
RDW: 17.5 % — AB (ref 11.5–15.5)
WBC: 6.7 10*3/uL (ref 4.0–10.5)
WBC: 6.3 10*3/uL (ref 4.0–10.5)

## 2015-08-17 LAB — FOLATE: FOLATE: 34.9 ng/mL (ref >5.9)

## 2015-08-17 LAB — IRON AND TIBC
IRON: 70 ug/dL (ref 45–182)
SATURATION RATIOS: 15 % — AB (ref 17.9–39.5)
TIBC: 462 ug/dL — AB (ref 250–450)
UIBC: 392 ug/dL

## 2015-08-17 LAB — RETICULOCYTES
RBC.: 3.19 MIL/uL — ABNORMAL LOW (ref 4.22–5.81)
Retic Count, Absolute: 178.6 10*3/uL (ref 19.0–186.0)
Retic Ct Pct: 5.6 % — ABNORMAL HIGH (ref 0.4–3.1)

## 2015-08-17 LAB — PREPARE RBC (CROSSMATCH)

## 2015-08-17 LAB — VITAMIN B12: Vitamin B-12: 974 pg/mL — ABNORMAL HIGH (ref 180–914)

## 2015-08-17 MED ORDER — ATORVASTATIN CALCIUM 20 MG PO TABS
20.0000 mg | ORAL_TABLET | Freq: Every day | ORAL | Status: DC
  Administered 2015-08-17: 20 mg via ORAL
  Filled 2015-08-17: qty 1

## 2015-08-17 MED ORDER — ALBUTEROL SULFATE (2.5 MG/3ML) 0.083% IN NEBU: 2.5000 mg | INHALATION_SOLUTION | RESPIRATORY_TRACT | Status: DC | PRN

## 2015-08-17 MED ORDER — SODIUM CHLORIDE 0.9 % IV SOLN
Freq: Once | INTRAVENOUS | Status: AC
  Administered 2015-08-17: 08:00:00 via INTRAVENOUS

## 2015-08-17 NOTE — Progress Notes (Signed)
     [facility] Progress Note  Subjective:  Feels well.  Eating entire meals.  No BM since procedure.  Receiving a unit of PRBC's currently.  Objective:  Vital signs in last 24 hours: Temp:  [97.6 F (36.4 C)-99.2 F (37.3 C)] 97.6 F (36.4 C) (03/08 0700) Pulse Rate:  [77-91] 77 (03/08 0700) Resp:  [17-28] 19 (03/08 0300) BP: (111-149)/(65-106) 116/65 mmHg (03/08 0300) SpO2:  [95 %-98 %] 95 % (03/08 0700) Weight:  [154 lb 12.2 oz (70.2 kg)] 154 lb 12.2 oz (70.2 kg) (03/08 0300)   General:  Alert, Well-developed, in NAD Heart:  Regular rate and rhythm; no murmurs Pulm:  CTAB.  No W/R/R. Abdomen:  Soft, non-distended.  BS present.  Non-tender, Extremities:  Without edema. Neurologic:  Alert and oriented x 4;  grossly normal neurologically. Psych:  Alert and cooperative. Normal mood and affect.  Intake/Output from previous day: 03/07 0701 - 03/08 0700 In: 840 [P.O.:840] Out: 600 [Urine:600] Intake/Output this shift: Total I/O In: 240 [P.O.:240] Out: -   Lab Results:  Recent Labs  08/15/15 1544 08/16/15 0408 08/16/15 1850 08/17/15 0325  WBC 7.7 7.4  --  6.7  HGB 9.1* 8.2* 9.3* 8.5*  HCT 27.9* 25.1*  --  26.0*  PLT 192 188  --  209   BMET  LFT  PT/INR  Recent Labs  08/14/15 1904  LABPROT 19.3*  INR 1.62*   Assessment / Plan: *Acute blood loss anemia in setting of UGIB with melena in pt on chronic AC (Eliquis) for hx A fib, CVA. Eliquis on hold. S/p PRBC x 4 (receiving 4th one currently).  Hgb was down yesterday AM then bumped up for the afternoon but was down some again today so they are transfusing one more unit. 08/15/15 EGD: 2 small gastric AVMs, 1 actively bleeding. Both treated with APC ablation, clipping of the bleeder.  No further signs of bleeding, but no BM since procedure.  Ok to restart Eliquis today and ok for discharge from GI standpoint later today or tomorrow AM.  Should continue on daily PPI for prophylaxis.  *CAD. Acute MI  06/2010. NQMI in setting of transfusion requiring anemia 12/2011. Anginal sxs for ~ 48 hours PTA: Troponin slightly elevated at 0.4, then 0.5 x 2. Dr Stanford Breed plans to resume Abixiban when safe but refer pt for Watchman atrial appendage occlusion procedure.    LOS: 3 days   ZEHR, JESSICA D.  08/17/2015, 8:52 AM  Pager number [facility] Attending   I have taken an interval history, reviewed the chart and examined the patient. I agree with the Advanced Practitioner's note, impression and recommendations.     I do not think he is bleeding - Hgb up and down a bit but in a narrow range. Did get 1 U RBC. Ok to restart anti-coagulation when cardiology wants. Signing off. GI f/u prn Stay on a PPI to help reduce future bleeding risk Will need PCP f/u Hgb along with cardiology.  [name], MD, [facility] [phone] (pager) [phone] after 5 PM, weekends and holidays  08/17/2015 4:35 PM

## 2015-08-17 NOTE — Progress Notes (Signed)
[facility] TEAM 1 - Stepdown/ICU TEAM PROGRESS NOTE  Eduardo [name] [id number] DOB: 10-07-1930 DOA: 08/14/2015 PCP: [name], MD  Admit HPI / Brief Narrative: 82 yo M with history of A fib on Eliquis, COPD, and HTN who presented to the ED with exertional dyspnea and chest pressure. He had been having melena for a few days and was found to have a hemoglobin of 6.5 with a baseline of 12.7 3 months prior. His troponin was elevated at 0.45 w/ depression and TWI in V5-6.   HPI/Subjective: The patient is resting comfortably in a bedside chair.  He is tolerating his meals without difficulty.  He has not yet had a bowel movement.  He denies chest pain shortness breath fevers chills nausea or vomiting.  He denies abdominal pain.  Assessment/Plan:  UGIB due to Gastric AVM  2 gastric AVMs note on EGD, with one actively bleeding requiring clip and injection - QD PPI - cleared for anticoag per GI, but Cards favors holding til Monday (f/u in Cards clinic)   Acute blood loss anemia  Goal will be to keep Hgb 8-9 - hemoglobin does not yet appear to have stabilized patient receiving 1 unit of packed red blood cells presently, representing his fourth unit since admission -  recheck hemoglobin this evening and again in the morning - if remains stable code potentially be discharged 3/9   Chest pain - Hx of CAD Cards suggests no ASA - Cards following - no plans for further CAD evaluation at this time   Afib  CHA2DS2-VASc 6 - on chronic eliquis - anticoagulation currently on hold - plan is to resume when safe to do so (Monday per Cards) and then refer for watchman evaluation   COPD Well compensated presently   HTN Currently well controlled   Chronic diastolic CHF Well compensated at present  Code Status: NO CPR - NO INTUBATION Family Communication: Spoke with wife at bedside Disposition Plan: stable for transfer to tele bed later today if Hgb holds steady - possible d/c home 3/9 if Hgb  stable   Consultants: [facility]   Procedures: EGD 3/6  Antibiotics: none  DVT prophylaxis: SCDs  Objective: Blood pressure 116/53, pulse 58, temperature 97.5 F (36.4 C), temperature source Oral, resp. rate 18, height 5\' 3"  (1.6 m), weight 70.2 kg (154 lb 12.2 oz), SpO2 98 %.  Intake/Output Summary (Last 24 hours) at 08/17/15 1036 Last data filed at 08/17/15 0836  Gross per 24 hour  Intake    720 ml  Output   1350 ml  Net   -630 ml   Exam: General: No acute respiratory distress - alert and pleasant  Lungs: Clear to auscultation bilaterally - no wheezes or crackles Cardiovascular: Regular rate without murmur gallop or rub  Abdomen: Nontender, nondistended, soft, bowel sounds positive, no rebound Extremities: No significant cyanosis, clubbing, edema bilateral lower extremities  Data Reviewed:  Basic Metabolic Panel:  Recent Labs Lab 08/14/15 1000 08/15/15 0457 08/16/15 0408  NA 139 142 142  K 4.2 3.7 4.3  CL 104 111 106  CO2 21* 24 26  GLUCOSE 100* 95 92  BUN 45* 34* 24*  CREATININE 1.13 1.03 0.92  CALCIUM 9.6 8.8* 9.0    CBC:  Recent Labs Lab 08/14/15 1904 08/15/15 0457 08/15/15 1544 08/16/15 0408 08/16/15 1850 08/17/15 0325  WBC 9.9 8.5 7.7 7.4  --  6.7  HGB 8.0* 7.6* 9.1* 8.2* 9.3* 8.5*  HCT 24.1* 23.2* 27.9* 25.1*  --  26.0*  MCV 88.3 87.2 88.9 89.0  --  89.3  PLT 203 203 192 188  --  209    Liver Function Tests:  Recent Labs Lab 08/14/15 1904 08/15/15 0457 08/16/15 0408  AST 30 29 30   ALT 17 18 17   ALKPHOS 31* 28* 28*  BILITOT 1.0 0.5 0.5  PROT 5.9* 5.2* 5.2*  ALBUMIN 3.5 3.3* 3.1*   Coags:  Recent Labs Lab 08/14/15 1904  INR 1.62*   Cardiac Enzymes:  Recent Labs Lab 08/14/15 1904 08/15/15 0457  TROPONINI 0.59* 0.58*    Recent Results (from the past 240 hour(s))  MRSA PCR Screening     Status: None   Collection Time: 08/14/15  7:34 PM  Result Value Ref Range Status   MRSA by PCR NEGATIVE  NEGATIVE Final    Comment:        The GeneXpert MRSA Assay (FDA approved for NASAL specimens only), is one component of a comprehensive MRSA colonization surveillance program. It is not intended to diagnose MRSA infection nor to guide or monitor treatment for MRSA infections.      Studies:   Recent x-ray studies have been reviewed in detail by the Attending Physician  Scheduled Meds:  Scheduled Meds: . sodium chloride   Intravenous Once  . escitalopram  20 mg Oral QHS  . fluticasone furoate-vilanterol  1 puff Inhalation QHS  . lamoTRIgine  150 mg Oral Daily  . pantoprazole  40 mg Oral QAC breakfast  . sodium chloride flush  3 mL Intravenous Q12H  . tiotropium  18 mcg Inhalation QHS  . verapamil  180 mg Oral QHS    Time spent on care of this patient: 35 mins   MCCLUNG,JEFFREY T , MD   [facility] Office  [phone] Pager - Text Page per Shea Evans as per below:  On-Call/Text Page:      [email or web address]      password [email or web address]  If 7PM-7AM, please contact night-coverage [email or web address] Password [email or web address] 08/17/2015, 10:36 AM   LOS: 3 days

## 2015-08-17 NOTE — Progress Notes (Signed)
When giving nighttime medications patients stated he took his own home medications. Discussed with patient why we need to know what medications are being taken and to alert [place] if he is ready for meds and we haven't brought them. Patient very agreeable.

## 2015-08-17 NOTE — Progress Notes (Signed)
    Subjective:  Denies CP or dyspnea   Objective:  Filed Vitals:   08/16/15 2310 08/16/15 2322 08/17/15 0300 08/17/15 0700  BP: 111/74  116/65   Pulse:    77  Temp: 98.9 F (37.2 C)  99.2 F (37.3 C) 97.6 F (36.4 C)  TempSrc: Oral  Oral Oral  Resp: 28  19   Height:      Weight:   154 lb 12.2 oz (70.2 kg)   SpO2:  98%  95%    Intake/Output from previous day:  Intake/Output Summary (Last 24 hours) at 08/17/15 0814 Last data filed at 08/16/15 1930  Gross per 24 hour  Intake    840 ml  Output    600 ml  Net    240 ml    Physical Exam: Physical exam: Well-developed well-nourished in no acute distress.  Skin is warm and dry.  HEENT is normal.  Neck is supple.  Chest is clear to auscultation with normal expansion.  Cardiovascular exam is irregular Abdominal exam nontender or distended. No masses palpated. Extremities show no edema. neuro grossly intact    Lab Results: Basic Metabolic Panel:  Recent Labs  08/15/15 0457 08/16/15 0408  NA 142 142  K 3.7 4.3  CL 111 106  CO2 24 26  GLUCOSE 95 92  BUN 34* 24*  CREATININE 1.03 0.92  CALCIUM 8.8* 9.0   CBC:  Recent Labs  08/16/15 0408 08/16/15 1850 08/17/15 0325  WBC 7.4  --  6.7  HGB 8.2* 9.3* 8.5*  HCT 25.1*  --  26.0*  MCV 89.0  --  89.3  PLT 188  --  209   Cardiac Enzymes:  Recent Labs  08/14/15 1904 08/15/15 0457  TROPONINI 0.59* 0.58*     Assessment/Plan:  1 Atrial fibrillation-continue verapamil for rate control; apixaban on hold due to active GI bleed. Hgb continues to trend down; would not resume anticoagulation for now. He has a fu appt Monday in our office; recheck Hgb then and if stable, resume anticoagulation then. I favor continuing apixaban instead of resuming coumadin. Watchman can be considered as outpatient. 2 GI bleed-Continue protonix; EGD results noted-related to AVMs which have been addressed; would continue to transfuse to keep Hgb > 9. 3 Elevated troponin-likely  related to demand ischemia in the setting of severe anemia; plan outpt nuclear study when he improves. 4 acute blood loss anemia-transfuse as per #2. 5 H/o diastolic CHF-follow exam and diurese as needed; not volume overloaded at present. 6 CAD-Would not resume ASA at time of DC given GI bleed and need for anticoagulation; resume statin at DC.  Kirk Ruths 08/17/2015, 8:14 AM

## 2015-08-18 ENCOUNTER — Other Ambulatory Visit: Payer: Self-pay | Admitting: *Deleted

## 2015-08-18 DIAGNOSIS — K31811 Angiodysplasia of stomach and duodenum with bleeding: Secondary | ICD-10-CM | POA: Diagnosis not present

## 2015-08-18 DIAGNOSIS — D62 Acute posthemorrhagic anemia: Secondary | ICD-10-CM | POA: Diagnosis not present

## 2015-08-18 DIAGNOSIS — I482 Chronic atrial fibrillation: Secondary | ICD-10-CM | POA: Diagnosis not present

## 2015-08-18 DIAGNOSIS — R0789 Other chest pain: Secondary | ICD-10-CM | POA: Diagnosis not present

## 2015-08-18 DIAGNOSIS — I5032 Chronic diastolic (congestive) heart failure: Secondary | ICD-10-CM | POA: Diagnosis not present

## 2015-08-18 DIAGNOSIS — E785 Hyperlipidemia, unspecified: Secondary | ICD-10-CM

## 2015-08-18 LAB — TYPE AND SCREEN
ABO/RH(D): A POS
Antibody Screen: NEGATIVE
Unit division: 0
Unit division: 0
Unit division: 0
Unit division: 0

## 2015-08-18 LAB — CBC
HEMATOCRIT: 27.8 % — AB (ref 39.0–52.0)
Hemoglobin: 9.5 g/dL — ABNORMAL LOW (ref 13.0–17.0)
MCH: 30.4 pg (ref 26.0–34.0)
MCHC: 34.2 g/dL (ref 30.0–36.0)
MCV: 89.1 fL (ref 78.0–100.0)
PLATELETS: 201 10*3/uL (ref 150–400)
RBC: 3.12 MIL/uL — AB (ref 4.22–5.81)
RDW: 17.5 % — ABNORMAL HIGH (ref 11.5–15.5)
WBC: 6.5 10*3/uL (ref 4.0–10.5)

## 2015-08-18 LAB — HEMOGLOBIN AND HEMATOCRIT, BLOOD
HCT: 29.8 % — ABNORMAL LOW (ref 39.0–52.0)
HEMOGLOBIN: 10 g/dL — AB (ref 13.0–17.0)

## 2015-08-18 LAB — COMPREHENSIVE METABOLIC PANEL
ALBUMIN: 3.2 g/dL — AB (ref 3.5–5.0)
ALT: 20 U/L (ref 17–63)
AST: 32 U/L (ref 15–41)
Alkaline Phosphatase: 35 U/L — ABNORMAL LOW (ref 38–126)
Anion gap: 10 (ref 5–15)
BUN: 20 mg/dL (ref 6–20)
CHLORIDE: 104 mmol/L (ref 101–111)
CO2: 25 mmol/L (ref 22–32)
CREATININE: 0.95 mg/dL (ref 0.61–1.24)
Calcium: 9.1 mg/dL (ref 8.9–10.3)
GFR calc Af Amer: >60 mL/min (ref >60)
GLUCOSE: 82 mg/dL (ref 65–99)
POTASSIUM: 4.1 mmol/L (ref 3.5–5.1)
Sodium: 139 mmol/L (ref 135–145)
Total Bilirubin: 0.5 mg/dL (ref 0.3–1.2)
Total Protein: 5.3 g/dL — ABNORMAL LOW (ref 6.5–8.1)

## 2015-08-18 MED ORDER — PANTOPRAZOLE SODIUM 40 MG PO TBEC: 40.0000 mg | DELAYED_RELEASE_TABLET | Freq: Every day | ORAL | Status: DC

## 2015-08-18 NOTE — Discharge Summary (Signed)
Physician Discharge Summary  Eduardo [name] [id number] DOB: 03-16-1931 DOA: 08/14/2015  PCP: [name], MD  Admit date: 08/14/2015 Discharge date: 08/18/2015  Time spent: 40 minutes  Recommendations for Outpatient Follow-up:  UGIB on anticoag/Bleeding AVM  - holding anticoag until patient sees cardiology on Monday  -Bleeding AVM see EGD report below -Hemoglobin stable safe for discharge  -Schedule follow-up for Dr. [name] in 1-2 weeks upper GI bleed/acute blood loss anemia  Acute blood loss anemia  -Goal will be to keep Hgb 8-9 - transfuse 1 additional unit (total of 4 thus far) and recheck Hgb this PM  -Cardiology requests transfuse for hemoglobin <9  Chest pain - Hx of CAD -no plans for further CAD evaluation at this time  -Restart aspirin 81 mg daily  Chronic Afib  -CHA2DS2-VASc 6 - on chronic eliquis  -If no bleed overnight would restart Apixaban in A.m.  -Cardiology plans to place Watchman device as outpatient, patient has appointment on Monday with cardiology to discuss Watchman device vs restarting anticoagulation    COPD Well compensated at present  HTN -Fairly well controlled  -Continue verapamil 180 mg daily  Chronic diastolic CHF -Well compensated at present     Discharge Diagnoses:  Principal Problem:   Acute blood loss anemia Active Problems:   Hyperlipemia   Essential hypertension   Atrial fibrillation (HCC)   COPD (chronic obstructive pulmonary disease) with emphysema (HCC)   Chronic diastolic HF (heart failure) (HCC)   Chest pain   Gastric angiodysplasia with hemorrhage   Melena   AVM (arteriovenous malformation) of stomach, acquired with hemorrhage   Discharge Condition: Stable  Diet recommendation: Heart healthy  Filed Weights   08/15/15 0500 08/17/15 0300 08/18/15 0417  Weight: 69.6 kg (153 lb 7 oz) 70.2 kg (154 lb 12.2 oz) 71 kg (156 lb 8.4 oz)    History of present illness:  82 yo WM PMHx A fib on  Eliquis, COPD, and HTN who presented to the ED with exertional dyspnea and chest pressure. He had been having melena for a few days and was found to have a hemoglobin of 6.5 with a baseline of 12.7 3 months prior. His troponin was elevated at 0.45 w/ depression and TWI in V5-6. During his hospitalization patient was treated for upper GI bleed secondary to AVM. Patient's Eliquis will BE held until he sees cardiologist on Monday.   Consultants: [facility]  [facility]   Procedure/Significant Events: 3/6 EGD;-2 small AVM's in gastric body, greater curve - one actively bleeding - both Tx w/ APC and the actively bleeding one was clipped post APC Tx - gastric settings used     Discharge Exam: Filed Vitals:   08/17/15 2325 08/18/15 0417 08/18/15 0731 08/18/15 1124  BP: 133/74 125/61    Pulse: 75 67    Temp: 97.5 F (36.4 C) 98.4 F (36.9 C) 97.7 F (36.5 C) 98 F (36.7 C)  TempSrc: Oral Oral Oral Oral  Resp: 15 23    Height:      Weight:  71 kg (156 lb 8.4 oz)    SpO2: 97% 98% 98% 97%    General: A/O 4, NAD, No acute respiratory distress Eyes: Negative headache, negative scleral hemorrhage ENT: Negative Runny nose, negative gingival bleeding, Neck: Negative scars, masses, torticollis, lymphadenopathy, JVD Lungs: Clear to auscultation bilaterally without wheezes or crackles Cardiovascular: Regular rhythm and rate, without murmur gallop or rub normal S1 and S2   Discharge Instructions  Discharge Instructions    AMB  Referral to [facility] Management    Complete by:  As directed   Please assign to [facility] for transition of care. History of AFIB, CHF, COPD. Written consent obtained. Likely discharge today 08/18/15. Please call with questions. Marthenia Rolling, [place], RN,BSN [facility] Liaison-[phone]  Reason for consult:  Please assign to [facility] RNCM  Diagnoses of:   COPD/ Pneumonia Heart Failure    Expected date of contact:  1-3 days (reserved for hospital  discharges)            Medication List    ASK your doctor about these medications        acetaminophen 650 MG CR tablet  Commonly known as:  TYLENOL  Take 650 mg by mouth 2 (two) times daily.     albuterol 108 (90 Base) MCG/ACT inhaler  Commonly known as:  PROAIR HFA  Inhale 2 puffs into the lungs every 6 (six) hours as needed for wheezing or shortness of breath.     apixaban 5 MG Tabs tablet  Commonly known as:  ELIQUIS  Take 1 tablet (5 mg total) by mouth 2 (two) times daily.     aspirin 81 MG tablet  Take 81 mg by mouth at bedtime.     atorvastatin 20 MG tablet  Commonly known as:  LIPITOR  Take 1 tablet (20 mg total) by mouth daily.     BREO ELLIPTA 100-25 MCG/INH Aepb  Generic drug:  fluticasone furoate-vilanterol  Inhale 1 puff into the lungs at bedtime.     cetirizine 10 MG tablet  Commonly known as:  ZYRTEC  Take 10 mg by mouth at bedtime.     escitalopram 20 MG tablet  Commonly known as:  LEXAPRO  Take 20 mg by mouth at bedtime.     fenofibrate 160 MG tablet  Take 1 tablet (160 mg total) by mouth daily.     lamoTRIgine 150 MG tablet  Commonly known as:  LAMICTAL  Take 150 mg by mouth at bedtime. Reported on 08/14/2015     LIMBREL 500 MG Caps  Generic drug:  Flavocoxid  Take 500 mg by mouth 2 (two) times daily. Medical food product     multivitamin tablet  Take 1 tablet by mouth at bedtime.     SUPER B COMPLEX PO  Take 1 tablet by mouth at bedtime.     tiotropium 18 MCG inhalation capsule  Commonly known as:  SPIRIVA  Place 18 mcg into inhaler and inhale at bedtime.     Tiotropium Bromide-Olodaterol 2.5-2.5 MCG/ACT Aers  Commonly known as:  STIOLTO RESPIMAT  Inhale 2 puffs into the lungs daily.     triamcinolone cream 0.1 %  Commonly known as:  KENALOG  Apply 1 application topically 2 (two) times daily.     TURMERIC PO  Take 1,200 mg by mouth 2 (two) times daily.     verapamil 180 MG CR tablet  Commonly known as:  CALAN-SR  Take 1  tablet (180 mg total) by mouth at bedtime.       Allergies  Allergen Reactions  . Penicillins Itching    Has patient had a PCN reaction causing immediate rash, facial/tongue/throat swelling, SOB or lightheadedness with hypotension: Yes Has patient had a PCN reaction causing severe rash involving mucus membranes or skin necrosis: No Has patient had a PCN reaction that required hospitalization No Has patient had a PCN reaction occurring within the last 10 years: No If all of the above answers are "NO",  then may proceed with Cephalosporin use.   . Sulfonamide Derivatives Other (See Comments)    "it affected my kidneys"  . Tape Other (See Comments)    Arms black and blue, tears skin.  Please use "paper" tape      The results of significant diagnostics from this hospitalization (including imaging, microbiology, ancillary and laboratory) are listed below for reference.    Significant Diagnostic Studies: Dg Chest 2 View  08/14/2015  CLINICAL DATA:  Shortness of breath for 1 week EXAM: CHEST  2 VIEW COMPARISON:  05/23/2015 FINDINGS: There is moderate cardiac enlargement. Diffuse bilateral bronchial wall thickening is identified. Basilar predominant interstitial coarsening is also noted. No superimposed pulmonary edema, airspace consolidation or pleural effusion identified. Spondylosis is present within the thoracic spine. IMPRESSION: 1. No acute findings. 2. Changes of chronic bronchitis. Electronically Signed   By: Kerby Moors M.D.   On: 08/14/2015 10:31    Microbiology: Recent Results (from the past 240 hour(s))  MRSA PCR Screening     Status: None   Collection Time: 08/14/15  7:34 PM  Result Value Ref Range Status   MRSA by PCR NEGATIVE NEGATIVE Final    Comment:        The GeneXpert MRSA Assay (FDA approved for NASAL specimens only), is one component of a comprehensive MRSA colonization surveillance program. It is not intended to diagnose MRSA infection nor to guide or monitor  treatment for MRSA infections.      Labs: Basic Metabolic Panel:  Recent Labs Lab 08/14/15 1000 08/15/15 0457 08/16/15 0408 08/18/15 0220  NA 139 142 142 139  K 4.2 3.7 4.3 4.1  CL 104 111 106 104  CO2 21* 24 26 25   GLUCOSE 100* 95 92 82  BUN 45* 34* 24* 20  CREATININE 1.13 1.03 0.92 0.95  CALCIUM 9.6 8.8* 9.0 9.1   Liver Function Tests:  Recent Labs Lab 08/14/15 1904 08/15/15 0457 08/16/15 0408 08/18/15 0220  AST 30 29 30  32  ALT 17 18 17 20   ALKPHOS 31* 28* 28* 35*  BILITOT 1.0 0.5 0.5 0.5  PROT 5.9* 5.2* 5.2* 5.3*  ALBUMIN 3.5 3.3* 3.1* 3.2*   No results for input(s): LIPASE, AMYLASE in the last 168 hours. No results for input(s): AMMONIA in the last 168 hours. CBC:  Recent Labs Lab 08/15/15 1544 08/16/15 0408 08/16/15 1850 08/17/15 0325 08/17/15 1558 08/18/15 0220 08/18/15 1008  WBC 7.7 7.4  --  6.7 6.3 6.5  --   HGB 9.1* 8.2* 9.3* 8.5* 9.8* 9.5* 10.0*  HCT 27.9* 25.1*  --  26.0* 28.6* 27.8* 29.8*  MCV 88.9 89.0  --  89.3 89.7 89.1  --   PLT 192 188  --  209 203 201  --    Cardiac Enzymes:  Recent Labs Lab 08/14/15 1904 08/15/15 0457  TROPONINI 0.59* 0.58*   BNP: BNP (last 3 results) No results for input(s): BNP in the last 8760 hours.  ProBNP (last 3 results) No results for input(s): PROBNP in the last 8760 hours.  CBG: No results for input(s): GLUCAP in the last 168 hours.     Signed:  [name], MD [facility] [phone] pager

## 2015-08-18 NOTE — Consult Note (Signed)
   [facility] CM Inpatient Consult   08/18/2015  Eduardo [name] [birth year] [id number]    Patient screened for [place] Management services. Went to bedside to discuss and offer [place] Management services. Spoke with patient and wife and both are agreeable. Written consent signed. Explained that he will receive post hospital transition of care calls and will be evaluated for monthly home visits. Confirmed Primary Care MD is Dr. [name]. Best contact number is home at [phone]. Patient has history of COPD, CHF, AFIB. Could benefit from post transition of care program with [facility] Management. Left [facility] Care Management packet and contact information at bedside. Appreciative of visit. Will make inpatient RNCM aware [facility] Management will follow post discharge.  Marthenia Rolling, MSN-Ed, RN,BSN [facility] Liaison [phone]

## 2015-08-19 ENCOUNTER — Telehealth: Payer: Self-pay | Admitting: *Deleted

## 2015-08-19 NOTE — Telephone Encounter (Signed)
Transition Care Management Follow-up Telephone Call   Date discharged? 08/18/15   How have you been since you were released from the hospital? Much improved.   Do you understand why you were in the hospital? yes   Do you understand the discharge instructions? yes   Where were you discharged to? home   Items Reviewed:  Medications reviewed: yes  Allergies reviewed: yes  Dietary changes reviewed: no  Referrals reviewed: cardiology, GI   Functional Questionnaire:   Activities of Daily Living (ADLs):   He states they are independent in the following: ambulation, bathing and hygiene, feeding, continence, grooming, toileting and dressing States they require assistance with the following: none   Any transportation issues/concerns?: no   Any patient concerns? no   Confirmed importance and date/time of follow-up visits scheduled yes, 09/01/15 @ 1430  Provider Appointment booked with [name], MD  Confirmed with patient if condition begins to worsen call PCP or go to the ER.  Patient was given the office number and encouraged to call back with question or concerns.  : yes

## 2015-08-19 NOTE — Telephone Encounter (Signed)
Transitional care call attempted.  Left message for patient to return call. 

## 2015-08-21 NOTE — Progress Notes (Signed)
Cardiology Office Note:    Date:  08/22/2015   ID:  Eduardo [name], DOB 1930/12/08, MRN [id number]  PCP:  [name], MD  Cardiologist:  Dr. Daneen Schick   Electrophysiologist:  Dr. Cristopher Peru   Chief Complaint  Patient presents with  . Hospitalization Follow-up    s/p UGI Bleed (tx with 4 units PRBCs)    History of Present Illness:     Eduardo [name] is a 82 y.o. male with a hx of chronic atrial fibrillation, prior warfarin therapy (discontinued after a fall resulting in large hematoma), CVA in 7/16 (Eliquis started at that time) 6, CAD with LHC in 0000000 complicated by dissection of ostial RCA (treated conservatively), diastolic CHF, HTN, HL, prior GI bleed.     Admitted 3/5-3/9 with crescendo angina and borderline troponin elevation in the setting of acute GI bleed. He was transfused with 4 units PRBCs.  EGD demonstrated 2 small AVMs both treated with APC (bleeding AVM clipped).  Troponin elevation was felt to be related to demand ischemia. Seen by Dr. Sherren Mocha in the hospital for consideration of Watchman Device implantation.  The patient and his wife were reluctant to consider invasive procedure and plan was to FU with Chanetta Marshall, NP as an OP to discuss this further.  Prior to discharge, recommendation from cardiology was to recheck Hgb at follow-up visit and, if stable, resume anticoagulation (Apixaban favored over Warfarin).  OP nuc study was recommended once he recovered.  It was recommended that ASA not be resumed due to risk of GI bleed and need for anticoagulation.    CHADS2-VASc=6.  Returns for FU. Here with his wife.  He is doing well.  He denies chest pain, syncope, orthopnea, PND. He has chronic LE edema that is stable. He has chronic SOB that is stable.     Past Medical History  Diagnosis Date  . Anemia 2012, 08/2015    chronic. Acute due to large hematoma 2013. 2017: due to gastric AVM bleeding.   . Anxiety   . Atrial fibrillation ([place]) 2012  . CAD  (coronary artery disease) 2012  . Hyperlipidemia   . COPD (chronic obstructive pulmonary disease) ([place]) 2013    exertional dyspnea  . Allergy   . Osteoarthritis 2002    spinal stenosis, spondylolisthesis, disc protrusion multilevel in lumbar spine  . Hx of colonic polyps 2001, 2011    adenomatous 2001. HP 2001, 2011.  . Myocardial infarction ([place]) 06/2010.  12/2011.     "twice; 1 day apart; during/after cath". NQMI in setting severe anemia 2013.   . Stroke ([place])   . Neuropathy ([place]) 2014    in feet, likely due to spinal stenosis, DDD, HNP  . Upper GI bleed 08/2015    EGD: 2 small gastric AVMs, 1 actively bleeding.  Both treated with APC ablation, clipping of the bleeder  . Gastric angiodysplasia with hemorrhage 08/2015    Past Surgical History  Procedure Laterality Date  . Appendectomy    . Carpal tunnel release      left  . Mastoidectomy      x3 on right  . Pilonidal cyst / sinus excision    . Knee arthroscopy      left, right knee 10/2009  . Shoulder arthroscopy      right  . Foot fracture surgery      right heel repair  . Nasal septum surgery      nasal septal repair  . Penile prosthesis implant  x 2--got infection after 2nd and had to remove  . Cataract extraction w/ intraocular lens implant  2/13    Dr [name]  . Carpal tunnel release  10/11/11    Dr Rush Barer  . Tonsillectomy and adenoidectomy    . Fracture surgery    . Cardiac catheterization  2012    50% LAD and luminal irreg in RCA, 1/12  Post cath MI from damage  . Penile prosthesis  removal    . Colonoscopy  2001, 02/2010    Dr Deatra Ina.   . Esophagogastroduodenoscopy N/A 08/15/2015    [name] MD; 2 small gastric AVMs, 1 actively bleeding.  Both treated with APC ablation, clipping of the bleeder    Current Medications: Outpatient Prescriptions Prior to Visit  Medication Sig Dispense Refill  . albuterol (PROAIR HFA) 108 (90 BASE) MCG/ACT inhaler Inhale 2 puffs into the lungs every 6 (six)  hours as needed for wheezing or shortness of breath. 3 Inhaler 3  . atorvastatin (LIPITOR) 20 MG tablet Take 1 tablet (20 mg total) by mouth daily. (Patient taking differently: Take 20 mg by mouth daily at 6 PM. ) 90 tablet 3  . B Complex-C (SUPER B COMPLEX PO) Take 1 tablet by mouth at bedtime.     Marland Kitchen BREO ELLIPTA 100-25 MCG/INH AEPB Inhale 1 puff into the lungs at bedtime.     . cetirizine (ZYRTEC) 10 MG tablet Take 10 mg by mouth at bedtime.     Marland Kitchen escitalopram (LEXAPRO) 20 MG tablet Take 20 mg by mouth at bedtime.     . fenofibrate 160 MG tablet Take 1 tablet (160 mg total) by mouth daily. (Patient taking differently: Take 160 mg by mouth at bedtime. ) 90 tablet 3  . Flavocoxid (LIMBREL) 500 MG CAPS Take 500 mg by mouth 2 (two) times daily. Medical food product    . lamoTRIgine (LAMICTAL) 150 MG tablet Take 150 mg by mouth at bedtime. Reported on 08/14/2015    . Multiple Vitamin (MULTIVITAMIN) tablet Take 1 tablet by mouth at bedtime.     . pantoprazole (PROTONIX) 40 MG tablet Take 1 tablet (40 mg total) by mouth daily before breakfast. 30 tablet 0  . Tiotropium Bromide-Olodaterol (STIOLTO RESPIMAT) 2.5-2.5 MCG/ACT AERS Inhale 2 puffs into the lungs daily. (Patient taking differently: Inhale 2 puffs into the lungs at bedtime. ) 3 Inhaler 3  . triamcinolone cream (KENALOG) 0.1 % Apply 1 application topically 2 (two) times daily.     . verapamil (CALAN-SR) 180 MG CR tablet Take 1 tablet (180 mg total) by mouth at bedtime. 30 tablet 11  . acetaminophen (TYLENOL) 650 MG CR tablet Take 650 mg by mouth 2 (two) times daily.     Marland Kitchen aspirin 81 MG tablet Take 81 mg by mouth at bedtime.      No facility-administered medications prior to visit.     Allergies:   Penicillins; Sulfonamide derivatives; and Tape   Social History   Social History  . Marital Status: Married    Spouse Name: N/A  . Number of Children: 4  . Years of Education: N/A   Occupational History  . Retired Chief Strategy Officer the  job trainin)    Social History Main Topics  . Smoking status: Former Smoker -- 2.50 packs/day for 30 years    Types: Cigarettes    Quit date: 06/11/1978  . Smokeless tobacco: Never Used  . Alcohol Use: No     Comment: QUIT [id number] alcoholic  . Drug Use: No  .  Sexual Activity: Not Currently   Other Topics Concern  . None   Social History Narrative   Grew up in [place]   Married---2nd   2 children ([place], [place]  and near [place])--2 stepchildren   Former Smoker--quit in 1980   Alcohol use-no. Quit in 1980--alcoholic      Has living will.   Has designated wife, then step daughter [name] have health care POA.   Would accept resuscitation attempts.   No feeding tube if cognitively unaware.     Family History:  The patient's family history includes Cancer in his mother; Heart attack in his father. There is no history of Diabetes or Hypertension.   ROS:   Please see the history of present illness.    Review of Systems  Cardiovascular: Positive for chest pain, dyspnea on exertion and irregular heartbeat.  Hematologic/Lymphatic: Positive for bleeding problem. Bruises/bleeds easily.  Musculoskeletal: Positive for back pain.  Gastrointestinal: Positive for hematochezia.  Neurological: Positive for loss of balance.  All other systems reviewed and are negative.   Physical Exam:    VS:  BP 138/60 mmHg  Pulse 54  Ht 5\' 3"  (1.6 m)  Wt 160 lb (72.576 kg)  BMI 28.35 kg/m2   GEN: Well nourished, well developed, in no acute distress HEENT: normal Neck: no JVD, no masses Cardiac: Normal S1/S2, irreg irreg rhythm; no murmurs, rubs, or gallops, trace to 1+ bilat LE edema;     Respiratory:  clear to auscultation bilaterally; no wheezing, rhonchi or rales GI: soft, nontender, nondistended MS: no deformity or atrophy Skin: warm and dry Neuro: No focal deficits  Psych: Alert and oriented x 3, normal affect  Wt Readings from Last 3 Encounters:  08/22/15 160 lb (72.576 kg)    08/18/15 156 lb 8.4 oz (71 kg)  06/22/15 157 lb 9.6 oz (71.487 kg)      Studies/Labs Reviewed:     EKG:  EKG is  ordered today.  The ekg ordered today demonstrates AFib, HR 53, normal axis, no change from prior tracing  Recent Labs: 08/18/2015: ALT 20; BUN 20; Creatinine, Ser 0.95; Hemoglobin 10.0*; Platelets 201; Potassium 4.1; Sodium 139   Recent Lipid Panel    Component Value Date/Time   CHOL 150 05/12/2015 0806   TRIG * 05/12/2015 0806    791.0 Triglyceride is over 400; calculations on Lipids are invalid.   HDL 44.10 05/12/2015 0806   CHOLHDL 3 05/12/2015 0806   VLDL UNABLE TO CALCULATE IF TRIGLYCERIDE OVER 400 mg/dL 12/31/2014 0510   LDLCALC UNABLE TO CALCULATE IF TRIGLYCERIDE OVER 400 mg/dL 12/31/2014 0510   LDLDIRECT 88.0 05/12/2015 0806    Additional studies/ records that were reviewed today include:   Carotid US 7/16 B/L ICA 1-39%  Echo 7/16 Severe concentric LVH, EF 60-65%, normal wall motion, calcified aortic valve leaflets with restricted motion, mild aortic stenosis (mean 9 mmHg), MAC, mild mitral stenosis, mild MR, severe LAE, normal RV function, mild TR, PASP 34 mmHg  LHC 1/12 LM patent LCx patent OM1 patent Distal LCx patent LAD moderate atherosclerosis proximally; L-R collaterals RCA proximal dissection  ASSESSMENT:     1. Coronary artery disease involving native coronary artery of native heart without angina pectoris   2. History of GI bleed   3. Anemia, unspecified anemia type   4. Chronic atrial fibrillation (HCC)   5. History of CVA (cerebrovascular accident)   6. Essential hypertension     PLAN:     In order of problems listed above:  1. CAD - No further angina. He had elevated Troponin levels in the setting of acute UGI bleed.  This was related to demand ischemia. I reviewed with Dr. Daneen Schick.  We do not feel that he needs stress testing at this time.  No ASA as he will likely go back on Apixaban. Continue statin.    2. Hx of GI bleed  - s/p acute bleed 2/2 AVMs. These were treated with clipping and APC.  Reviewed notes.  Dr. Carlean Purl felt like anticoagulation could be resumed per cardiology's recommendations.  3. Anemia - Repeat CBC today. If hemoglobin stable, resume Eliquis 5 mg twice a day. I would plan on checking CBC quite often over 6 weeks' time after resuming Eliquis.  4. Chronic atrial fibrillation - Rate controlled. As noted, hemoglobin will be checked again today. If stable, resume Eliquis.  5. History of CVA - He has high risk for recurrent stroke. Resume Eliquis if possible. If he has recurrent bleeding on Eliquis, reconsider Watchman Device.  6. HTN - Controlled.    Medication Adjustments/Labs and Tests Ordered: Current medicines are reviewed at length with the patient today.  Concerns regarding medicines are outlined above.  Medication changes, Labs and Tests ordered today are outlined in the Patient Instructions noted below. Patient Instructions  Medication Instructions:  Your physician recommends that you continue on your current medications as directed. Please refer to the Current Medication list given to you today.  Labwork: TODAY BMET, CBC W/DIFF  Testing/Procedures: NONE  Follow-Up: KEEP YOUR APPT WITH DR. Tamala Julian 11/11/15  Any Other Special Instructions Will Be Listed Below (If Applicable).  If you need a refill on your cardiac medications before your next appointment, please call your pharmacy.     Signed, Richardson Dopp, PA-C  08/22/2015 9:59 AM    [facility] [place], [place], [place]  [address] Phone: [phone]; Fax: [phone]

## 2015-08-22 ENCOUNTER — Other Ambulatory Visit: Payer: Self-pay | Admitting: *Deleted

## 2015-08-22 ENCOUNTER — Ambulatory Visit (INDEPENDENT_AMBULATORY_CARE_PROVIDER_SITE_OTHER): Payer: PPO | Admitting: Physician Assistant

## 2015-08-22 ENCOUNTER — Telehealth: Payer: Self-pay | Admitting: Physician Assistant

## 2015-08-22 ENCOUNTER — Encounter: Payer: Self-pay | Admitting: Physician Assistant

## 2015-08-22 VITALS — BP 138/60 | HR 54 | Ht 63.0 in | Wt 160.0 lb

## 2015-08-22 DIAGNOSIS — Z8719 Personal history of other diseases of the digestive system: Secondary | ICD-10-CM

## 2015-08-22 DIAGNOSIS — Z8673 Personal history of transient ischemic attack (TIA), and cerebral infarction without residual deficits: Secondary | ICD-10-CM

## 2015-08-22 DIAGNOSIS — I2510 Atherosclerotic heart disease of native coronary artery without angina pectoris: Secondary | ICD-10-CM | POA: Diagnosis not present

## 2015-08-22 DIAGNOSIS — I10 Essential (primary) hypertension: Secondary | ICD-10-CM

## 2015-08-22 DIAGNOSIS — D649 Anemia, unspecified: Secondary | ICD-10-CM | POA: Diagnosis not present

## 2015-08-22 DIAGNOSIS — I482 Chronic atrial fibrillation, unspecified: Secondary | ICD-10-CM

## 2015-08-22 LAB — CBC WITH DIFFERENTIAL/PLATELET
BASOS PCT: 0 % (ref 0–1)
Basophils Absolute: 0 10*3/uL (ref 0.0–0.1)
EOS ABS: 0.3 10*3/uL (ref 0.0–0.7)
EOS PCT: 4 % (ref 0–5)
HCT: 31.5 % — ABNORMAL LOW (ref 39.0–52.0)
Hemoglobin: 10.3 g/dL — ABNORMAL LOW (ref 13.0–17.0)
LYMPHS ABS: 1.2 10*3/uL (ref 0.7–4.0)
Lymphocytes Relative: 17 % (ref 12–46)
MCH: 29.9 pg (ref 26.0–34.0)
MCHC: 32.7 g/dL (ref 30.0–36.0)
MCV: 91.6 fL (ref 78.0–100.0)
MONO ABS: 0.7 10*3/uL (ref 0.1–1.0)
MONOS PCT: 10 % (ref 3–12)
MPV: 9.6 fL (ref 8.6–12.4)
Neutro Abs: 4.8 10*3/uL (ref 1.7–7.7)
Neutrophils Relative %: 69 % (ref 43–77)
PLATELETS: 270 10*3/uL (ref 150–400)
RBC: 3.44 MIL/uL — ABNORMAL LOW (ref 4.22–5.81)
RDW: 16.8 % — AB (ref 11.5–15.5)
WBC: 7 10*3/uL (ref 4.0–10.5)

## 2015-08-22 LAB — BASIC METABOLIC PANEL
BUN: 23 mg/dL (ref 7–25)
CALCIUM: 9.2 mg/dL (ref 8.6–10.3)
CHLORIDE: 106 mmol/L (ref 98–110)
CO2: 28 mmol/L (ref 20–31)
CREATININE: 1.01 mg/dL (ref 0.70–1.11)
GLUCOSE: 100 mg/dL — AB (ref 65–99)
Potassium: 4.4 mmol/L (ref 3.5–5.3)
Sodium: 139 mmol/L (ref 135–146)

## 2015-08-22 MED ORDER — VERAPAMIL HCL ER 180 MG PO TBCR: 180.0000 mg | EXTENDED_RELEASE_TABLET | Freq: Every day | ORAL | Status: DC

## 2015-08-22 NOTE — Telephone Encounter (Signed)
Lmptcb for lab results and recommendations 

## 2015-08-22 NOTE — Patient Outreach (Signed)
[facility]) Care Management Transition of Care Telephone Outreach, week 1 post-discharge 08/22/2015  Eduardo [name] 1931/04/27 [id number]  Successful telephone outreach to Eduardo [name], who is an 82 year old male referred to [facility] after inpatient hospital visit 3/5-3/19, 2017, for UGIB with anticoagulation.  Today, I spoke with the pt.'s wife, Robynn Pane, as she reported patient was resting after visiting the doctor today.  HIPPA verified through Ms. Artist Beach.  Ms. Artist Beach reported that patient has been feeling good after being discharged from the hospital and that he followed up today with his cardiologist.  Ms. Artist Beach reported that patient is taking his medications as prescribed and fills his own pill box.  Ms. Artist Beach reported that he has a BP machine and pulse oximeter at home, which they "rarely need to use," stating that Mr. Ganser manages his healthcare with her assistance without difficulty.  Ms. Artist Beach reported that he "weighs himself frequently," although he does not record his weights, stating, "his weight stays steady between 155-160 pounds... Today it was 155 pounds."  Ms. Artist Beach and I discussed purpose of [place] and she verbalized that she "doesn't believe we need you to come out to our home."   [facility] transition of care guidelines discussed with her, and she shared that they "would prefer phone calls over a home visit; we don't need anyone coming to our home, because we manage ourselves very well."  I explained that participation was voluntary, noting that he had signed the written consent while hospitalized, and asked her to discuss with Mr. Deubel, which she stated she would do.  Plan: Ms. Artist Beach will discuss [place] follow up phone call with Mr. Atiles [facility] RNCM will call patient back next week during morning hours for transition of care outreach for (post-discharge) week [place], RN, BSN, [place] Coordinator [facility] Care Management  [phone]

## 2015-08-22 NOTE — Telephone Encounter (Signed)
New Message:  Pt is calling in to get the results to his blood test that was done today

## 2015-08-22 NOTE — Patient Instructions (Addendum)
Medication Instructions:  Your physician recommends that you continue on your current medications as directed. Please refer to the Current Medication list given to you today.  Labwork: TODAY BMET, CBC W/DIFF  Testing/Procedures: NONE  Follow-Up: KEEP YOUR APPT WITH DR. Tamala Julian 11/11/15  Any Other Special Instructions Will Be Listed Below (If Applicable).  If you need a refill on your cardiac medications before your next appointment, please call your pharmacy.

## 2015-08-23 ENCOUNTER — Encounter: Payer: Self-pay | Admitting: Physician Assistant

## 2015-08-23 NOTE — Telephone Encounter (Signed)
Returned call to patient's wife no answer.LMTC. 

## 2015-08-23 NOTE — Telephone Encounter (Signed)
Letter is on my cart in [facility]. Richardson Dopp, PA-C   08/23/2015 5:27 PM

## 2015-08-23 NOTE — Telephone Encounter (Signed)
Follow Up:  Pt says she needs to talk to you again,she needs lab order.

## 2015-08-23 NOTE — Telephone Encounter (Signed)
Spoke to patient's wife Eduardo [name] recommendations given.Stated Physical Therapist needs a letter from [employer] PA clearing him to return to Physical Therapy in a gym setting.Needs letter to indicate what he should not do.Stated when letter is completed mail to her home and she will give to PT.Message sent to [facility].

## 2015-08-23 NOTE — Telephone Encounter (Signed)
Follow up      Returning a call to the nurse to get lab results.  Pt request someone call him soon because he has another appt to go to

## 2015-08-23 NOTE — Telephone Encounter (Signed)
He may slowly resume his normal activities. Richardson Dopp, PA-C   08/23/2015 12:47 PM

## 2015-08-23 NOTE — Telephone Encounter (Signed)
Returned call to patient's wife lab results given.Wife stated patient will have repeat HGB's done at PCP and results to be faxed to [facility] PA.Stated she wanted to ask Nicki Reaper if ok for husband to go back to gym.Message sent to [facility] for advice.

## 2015-08-24 NOTE — Telephone Encounter (Signed)
08/22/15 lab results with order to have repeat Hgb's faxed to PCP Dr.Letvak at fax # [phone].

## 2015-08-24 NOTE — Telephone Encounter (Signed)
08/22/15 Lab results with order to have repeat Hgb's faxed to PCP Dr.Letvak at fax # [phone].

## 2015-08-29 ENCOUNTER — Encounter: Payer: Self-pay | Admitting: *Deleted

## 2015-08-29 ENCOUNTER — Telehealth: Payer: Self-pay | Admitting: Physician Assistant

## 2015-08-29 ENCOUNTER — Encounter: Payer: Self-pay | Admitting: Physician Assistant

## 2015-08-29 ENCOUNTER — Other Ambulatory Visit (INDEPENDENT_AMBULATORY_CARE_PROVIDER_SITE_OTHER): Payer: PPO

## 2015-08-29 DIAGNOSIS — D649 Anemia, unspecified: Secondary | ICD-10-CM | POA: Diagnosis not present

## 2015-08-29 DIAGNOSIS — H52213 Irregular astigmatism, bilateral: Secondary | ICD-10-CM | POA: Diagnosis not present

## 2015-08-29 DIAGNOSIS — Z9841 Cataract extraction status, right eye: Secondary | ICD-10-CM | POA: Diagnosis not present

## 2015-08-29 DIAGNOSIS — Z9842 Cataract extraction status, left eye: Secondary | ICD-10-CM | POA: Diagnosis not present

## 2015-08-29 LAB — HEMOGLOBIN: Hemoglobin: 10.9 g/dL — ABNORMAL LOW (ref 13.0–17.0)

## 2015-08-29 NOTE — Telephone Encounter (Signed)
Lmptcb for lab results. I also need to let pt know letter for exercise program is ready and who does he need me to fax it too.

## 2015-08-29 NOTE — Telephone Encounter (Signed)
Lmptcb for lab results 

## 2015-08-29 NOTE — Telephone Encounter (Signed)
Scott ,  See note below from today pt call; you did a note on 3/14 say he could resume exercise. Pt asking note needs to state what degree can he exercise. Please advise.   Pt said he needs a note to go back to exercising and what degree he could exercise. Please fax to [phone].  Thank you Arbie Cookey

## 2015-08-29 NOTE — Telephone Encounter (Signed)
New letter completed. Richardson Dopp, PA-C   08/29/2015 1:41 PM

## 2015-08-29 NOTE — Telephone Encounter (Signed)
New Message:   Pt said he needs a note to go back to exercising and what degree he could exercise. Please fax to [phone].

## 2015-08-30 ENCOUNTER — Other Ambulatory Visit: Payer: Self-pay | Admitting: *Deleted

## 2015-08-30 NOTE — Telephone Encounter (Signed)
Lmptcb x 3

## 2015-08-30 NOTE — Patient Outreach (Signed)
[facility]) Care Management [facility] Community CM Transition of Care telephone outreach, post-discharge week 2 08/30/2015  Eduardo [name] 1931-03-04 [id number]  Unsuccessful telephone outreach to follow up with patient after discharge from [place] visit March 5-9, 2017.  HIPPA compliant VM msg left for patient, asking for him to call me back; will re-try later this week if I don't hear back from him first.  Oneta Rack, RN, BSN, [place] Coordinator [facility] Care Management  [phone]

## 2015-08-30 NOTE — Telephone Encounter (Signed)
Ptcb and has been notified about lab results. I verified fax # for letter for pt and he states this is to his house. I stated to pt that I cannot verify that is a secure fax and if he is ok with that then I will fax letter to him. Pt said yes ok.

## 2015-08-30 NOTE — Telephone Encounter (Signed)
F/u  Pt giving fax # for letter to be sent- [phone]. Please call back and discuss.

## 2015-09-01 ENCOUNTER — Encounter: Payer: Self-pay | Admitting: Internal Medicine

## 2015-09-01 ENCOUNTER — Ambulatory Visit (INDEPENDENT_AMBULATORY_CARE_PROVIDER_SITE_OTHER): Payer: PPO | Admitting: Internal Medicine

## 2015-09-01 VITALS — BP 158/86 | HR 80 | Temp 98.0°F | Wt 159.0 lb

## 2015-09-01 DIAGNOSIS — Z5189 Encounter for other specified aftercare: Secondary | ICD-10-CM

## 2015-09-01 DIAGNOSIS — I4891 Unspecified atrial fibrillation: Secondary | ICD-10-CM

## 2015-09-01 DIAGNOSIS — K31811 Angiodysplasia of stomach and duodenum with bleeding: Secondary | ICD-10-CM | POA: Diagnosis not present

## 2015-09-01 DIAGNOSIS — R0789 Other chest pain: Secondary | ICD-10-CM | POA: Diagnosis not present

## 2015-09-01 MED ORDER — PANTOPRAZOLE SODIUM 40 MG PO TBEC: 40.0000 mg | DELAYED_RELEASE_TABLET | Freq: Every day | ORAL | Status: DC

## 2015-09-01 NOTE — Assessment & Plan Note (Signed)
Due to anemia No recurrence

## 2015-09-01 NOTE — Progress Notes (Signed)
Subjective:    Patient ID: Eduardo [name], male    DOB: 06-08-1931, 82 y.o.   MRN: [id number]  HPI Here for hospital follow up due to GI bleed Wife is here  Had upper chest ache felt to be anginal Demand ischemia only diagnosed Anemia--- needed 4 units of blood 2 gastric AVMs  No black stools Bowels are good No blood in stool  eliquis restarted now Off ASA No chest pain or ache No palpitations No dizziness Troubled by edema--even persists in AM (more than his usual). No pain   Did have consultation about a Watchman device He is not excited about this yet  Current Outpatient Prescriptions on File Prior to Visit  Medication Sig Dispense Refill  . albuterol (PROAIR HFA) 108 (90 BASE) MCG/ACT inhaler Inhale 2 puffs into the lungs every 6 (six) hours as needed for wheezing or shortness of breath. 3 Inhaler 3  . atorvastatin (LIPITOR) 20 MG tablet Take 1 tablet (20 mg total) by mouth daily. (Patient taking differently: Take 20 mg by mouth daily at 6 PM. ) 90 tablet 3  . B Complex-C (SUPER B COMPLEX PO) Take 1 tablet by mouth at bedtime.     . cetirizine (ZYRTEC) 10 MG tablet Take 10 mg by mouth at bedtime.     Marland Kitchen escitalopram (LEXAPRO) 20 MG tablet Take 20 mg by mouth at bedtime.     . fenofibrate 160 MG tablet Take 1 tablet (160 mg total) by mouth daily. (Patient taking differently: Take 160 mg by mouth at bedtime. ) 90 tablet 3  . Flavocoxid (LIMBREL) 500 MG CAPS Take 500 mg by mouth 2 (two) times daily. Medical food product    . lamoTRIgine (LAMICTAL) 150 MG tablet Take 150 mg by mouth at bedtime. Reported on 08/14/2015    . Multiple Vitamin (MULTIVITAMIN) tablet Take 1 tablet by mouth at bedtime.     . pantoprazole (PROTONIX) 40 MG tablet Take 1 tablet (40 mg total) by mouth daily before breakfast. 30 tablet 0  . Tiotropium Bromide-Olodaterol (STIOLTO RESPIMAT) 2.5-2.5 MCG/ACT AERS Inhale 2 puffs into the lungs daily. (Patient taking differently: Inhale 2 puffs into the lungs  at bedtime. ) 3 Inhaler 3  . triamcinolone cream (KENALOG) 0.1 % Apply 1 application topically 2 (two) times daily.     . verapamil (CALAN-SR) 180 MG CR tablet Take 1 tablet (180 mg total) by mouth at bedtime. 90 tablet 3   No current facility-administered medications on file prior to visit.    Allergies  Allergen Reactions  . Penicillins Itching    Has patient had a PCN reaction causing immediate rash, facial/tongue/throat swelling, SOB or lightheadedness with hypotension: Yes Has patient had a PCN reaction causing severe rash involving mucus membranes or skin necrosis: No Has patient had a PCN reaction that required hospitalization No Has patient had a PCN reaction occurring within the last 10 years: No If all of the above answers are "NO", then may proceed with Cephalosporin use.   . Sulfonamide Derivatives Other (See Comments)    "it affected my kidneys"  . Tape Other (See Comments)    Arms black and blue, tears skin.  Please use "paper" tape    Past Medical History  Diagnosis Date  . Anemia 2012, 08/2015    chronic. Acute due to large hematoma 2013. 2017: due to gastric AVM bleeding.   . Anxiety   . Atrial fibrillation ([place]) 2012  . CAD (coronary artery disease) 2012  . Hyperlipidemia   .  COPD (chronic obstructive pulmonary disease) ([place]) 2013    exertional dyspnea  . Allergy   . Osteoarthritis 2002    spinal stenosis, spondylolisthesis, disc protrusion multilevel in lumbar spine  . Hx of colonic polyps 2001, 2011    adenomatous 2001. HP 2001, 2011.  . Myocardial infarction ([place]) 06/2010.  12/2011.     "twice; 1 day apart; during/after cath". NQMI in setting severe anemia 2013.   . Stroke ([place])   . Neuropathy ([place]) 2014    in feet, likely due to spinal stenosis, DDD, HNP  . Upper GI bleed 08/2015    EGD: 2 small gastric AVMs, 1 actively bleeding.  Both treated with APC ablation, clipping of the bleeder  . Gastric angiodysplasia with hemorrhage 08/2015    Past Surgical  History  Procedure Laterality Date  . Appendectomy    . Carpal tunnel release      left  . Mastoidectomy      x3 on right  . Pilonidal cyst / sinus excision    . Knee arthroscopy      left, right knee 10/2009  . Shoulder arthroscopy      right  . Foot fracture surgery      right heel repair  . Nasal septum surgery      nasal septal repair  . Penile prosthesis implant      x 2--got infection after 2nd and had to remove  . Cataract extraction w/ intraocular lens implant  2/13    Dr [name]  . Carpal tunnel release  10/11/11    Dr Rush Barer  . Tonsillectomy and adenoidectomy    . Fracture surgery    . Cardiac catheterization  2012    50% LAD and luminal irreg in RCA, 1/12  Post cath MI from damage  . Penile prosthesis  removal    . Colonoscopy  2001, 02/2010    Dr Deatra Ina.   . Esophagogastroduodenoscopy N/A 08/15/2015    [name] MD; 2 small gastric AVMs, 1 actively bleeding.  Both treated with APC ablation, clipping of the bleeder    Family History  Problem Relation Age of Onset  . Diabetes Neg Hx   . Hypertension Neg Hx   . Heart attack Father   . Cancer Mother     ? leukemia    Social History   Social History  . Marital Status: Married    Spouse Name: N/A  . Number of Children: 4  . Years of Education: N/A   Occupational History  . Retired Chief Strategy Officer the job trainin)    Social History Main Topics  . Smoking status: Former Smoker -- 2.50 packs/day for 30 years    Types: Cigarettes    Quit date: 06/11/1978  . Smokeless tobacco: Never Used  . Alcohol Use: No     Comment: QUIT [id number] alcoholic  . Drug Use: No  . Sexual Activity: Not Currently   Other Topics Concern  . Not on file   Social History Narrative   Grew up in [place]   Married---2nd   2 children ([place], [place]  and near [place])--2 stepchildren   Former Smoker--quit in 1980   Alcohol use-no. Quit in 1980--alcoholic      Has living will.   Has designated wife,  then step daughter [name] have health care POA.   Would accept resuscitation attempts.   No feeding tube if cognitively unaware.   Review of Systems Appetite is good Sleeps well --in bed Voids okay  Objective:   Physical Exam  Constitutional: He appears well-developed and well-nourished. No distress.  Neck: Normal range of motion. Neck supple. No thyromegaly present.  Cardiovascular: Normal rate and normal heart sounds.  Exam reveals no gallop.   No murmur heard. Slightly irregular  Pulmonary/Chest: Effort normal and breath sounds normal. No respiratory distress. He has no wheezes. He has no rales.  Abdominal: Soft. There is no tenderness.  Musculoskeletal:  Trace to 1+ edema in both calves. No tenderness  Lymphadenopathy:    He has no cervical adenopathy.          Assessment & Plan:

## 2015-09-01 NOTE — Progress Notes (Signed)
Pre visit review using our clinic review tool, if applicable. No additional management support is needed unless otherwise documented below in the visit note. 

## 2015-09-01 NOTE — Assessment & Plan Note (Signed)
Good rate control  Back on eliquis which is appropriate I would agree with them that we should hold off on Watchman unless he has another bleed

## 2015-09-01 NOTE — Assessment & Plan Note (Addendum)
No evidence of recurrence Hemoglobin is on the rise Will continue the PPI for now

## 2015-09-02 ENCOUNTER — Other Ambulatory Visit: Payer: Self-pay | Admitting: *Deleted

## 2015-09-02 ENCOUNTER — Ambulatory Visit: Payer: Self-pay | Admitting: *Deleted

## 2015-09-02 NOTE — Patient Outreach (Signed)
[facility]) Care Management  09/02/2015  PETIE RUDLOFF 1930-07-22 [id number]   Transition of Care, week 2, attempt 3  Unsuccessful telephone outreach to follow up with patient after discharge from [place] visit March 5-9, 2017. Mr. Trettel returned my call from earlier today and left a VM msg, asking that I call him back this afternoon.    Mr. Schlabach wife, Ms. Artist Beach, answered the phone and said that Mr. Landino was not available at this time.  She stated that she "would have him call back next week."  I offered to generate telephone outreach next week, but Ms. Artist Beach said, "it really works better if he just calls you."  I asked her to please have him a leave a msg for me if he got my VM, and she said she would do so.  Ms. Artist Beach said that Mr. Winward "was doing great."  Plan:  Will listen out for a call from Mr. Ellwood next week, week 3 in transition of care post-hospital discharge.  Oneta Rack, RN, BSN, [employer] [facility] Care Management  [phone]

## 2015-09-02 NOTE — Patient Outreach (Signed)
[facility]) Care Management [place] telephone outreach   09/02/2015   Eduardo [name] 11-Mar-1931 [id number]   Transition of Care, week 2, attempt 2  Unsuccessful telephone outreach to follow up with patient after discharge from [place] visit March 5-9, 2017. HIPPA compliant VM msg left for patient, asking for him to call me back; will re-try next week if I don't hear back from him first.  Oneta Rack, RN, BSN, [place] Coordinator [facility] Care Management  [phone]

## 2015-09-06 ENCOUNTER — Ambulatory Visit: Payer: PPO | Admitting: *Deleted

## 2015-09-08 ENCOUNTER — Other Ambulatory Visit: Payer: Self-pay | Admitting: *Deleted

## 2015-09-08 ENCOUNTER — Encounter: Payer: Self-pay | Admitting: *Deleted

## 2015-09-08 NOTE — Patient Outreach (Signed)
[place] [facility]) Care Management  09/08/2015  HORUS BELLOFATTO 17-Aug-1930 [id number]   Successful telephone outreach to Mr. Jamicheal Glunz, who is an 82 y/o male followed by [facility] for transition of care after recent [facility] hospital visit, March 5-9, 2017.  Numerous previous unsuccessful telephone attempts to engage patient have been placed to Mr. Paskett since his discharge home.  Today, Mr. Manjarres reports that he is "doing great, having trouble, and going to the gym to get some light exercise." Mr. donivan burau that he does not wish to continue participating in [place] outreach as he believes he does not need [facility] CM services at this time.  I explained [facility] CM services to Mr. Steedley, and let him know that should he wish to participate in [facility] services in the future, we would be happy to become re-involved.  I also explained that I would put a letter in the mail to him with my direct contact information if he wished to resume participation in the [facility] program.  I expressed encouragement to the patient around his reports that he is doing well post-discharge, and Mr. Catton was very appreciative of the phone call.  Plan: I will close this [facility] encounter and send a letter to both the patient and his PCP informing of same.  Oneta Rack, RN, BSN, [employer] [facility] Care Management  [phone]

## 2015-09-09 ENCOUNTER — Encounter: Payer: Self-pay | Admitting: Emergency Medicine

## 2015-09-09 ENCOUNTER — Ambulatory Visit (INDEPENDENT_AMBULATORY_CARE_PROVIDER_SITE_OTHER): Payer: PPO | Admitting: Emergency Medicine

## 2015-09-09 VITALS — BP 144/72 | HR 80 | Wt 158.0 lb

## 2015-09-09 DIAGNOSIS — J438 Other emphysema: Secondary | ICD-10-CM | POA: Diagnosis not present

## 2015-09-09 DIAGNOSIS — J309 Allergic rhinitis, unspecified: Secondary | ICD-10-CM

## 2015-09-09 NOTE — Progress Notes (Signed)
Subjective:    Patient ID: Eduardo [name], male    DOB: 1931-06-05, 82 y.o.   MRN: [id number] HPI 82 yo man, former smoker, CAD s/p MI 1/12, A Fib, allergies, exertional SOB for a few yrs. This has been worse since recent hosp in 1/12 - was cathed x 2, no intervention made, course c/b renal failure. 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 No hypoxemia reported with exercise. Was sent from the hospital on metoprolol (new). He has heard wheezing, especially when nasal allergies. Arlyce Harman at Dr Alla German office suggested AFL/COPD.    ROV 08/27/14 -- follow up visit for COPD and allergic rhinitis, cough. He tells me that he has continued to have a lot of mucous - uses NSW's regularly. He does build up mucous overnight but it doesn't wake him. He is congested in the am. He is on zyrtec, no longer on singulair.  We discussed pursed lip breathing and incentive spirometry today. He is on Breo and tolerates well. His activity level is pretty good but spinal stenosis does slow him down. He works with a Clinical research associate 2x a week. No exacerbations since last time.   ROV 10/25/14 -- follow-up visit for COPD (FEV1 1.03 L or 50% predicted, 09/2010), cough and allergic rhinitis. He reports today progressive dyspnea for last 3 months, happens when sleeping and sometimes when supine. His wife has also noted some exertional SOB, has to stop after 200 feet. He is on Breo, but we stopped spiriva. With these new sx he added back spiriva 4 days ago. He is still able to do his daily workouts, trainer 2x a week.  No wheeze or cough. His wife is interested in looking into getting a scooter for him.   ROV 03/11/15 -- follow up visit for severe COPD, rhinitis and chronic cough. 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 His cognitive abilities are improved to baseline. Breathing has been good since last visit.  He usually has trouble with Fall allergies. He is on  Zyrtec, astelin prn.  He is stable on breo and spiriva. He is able to walk indefinitely. Sometimes coughs. No wheezing.   ROV 09/09/15 -- patient with a history of significant COPD, allergic rhinitis, chronic cough. Been following him for the last 5-6 years. He was last seen in our office by Dr Melvyn Novas in December 2016 at which time he was treated for an acute exacerbation and also given a trial of Stiolto. He was subsequently continued on Spiriva until it ran out. He then switched to [facility] and he feels that he is doing well on it. 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 He is now back on his anti-coagulation. He is doing well, breathing is not limiting. He has astelin NS that he can use prn. Is on zyrtec.   Review of Systems As per HPI    Objective:  Physical Exam Filed Vitals:   09/09/15 1644  BP: 144/72  Pulse: 80  Weight: 158 lb (71.668 kg)  SpO2: 93%   Gen: Pleasant, well-nourished, in no distress,  normal affect  ENT: No lesions,  mouth clear,  oropharynx clear, no postnasal drip  Neck: No JVD, insp and exp stridor  Lungs: No use of accessory muscles, referred UA noises.   Cardiovascular: irregular, heart sounds normal, no murmur or gallops, no peripheral edema  Musculoskeletal: No deformities, no cyanosis or clubbing  Neuro: alert, non focal  Skin: Warm, no lesions or rashes   Assessment & Plan:  COPD (chronic obstructive pulmonary disease) with emphysema ([place]) He has done well on Stiolto, and we will continue this. He will use albuterol when necessary. We reviewed the signs and symptoms of an acute exacerbation and he will call if he develops any of these. Follow-up in 6 months  Allergic rhinitis Continue Zyrtec daily and Astelin nasal spray when necessary

## 2015-09-09 NOTE — Patient Instructions (Addendum)
We will continue Stioloto 2 puffs every day Take albuterol 2 puffs up to every 4 hours if needed for shortness of breath.  Follow with Dr Lamonte Sakai in 6 months or sooner if you have any problems

## 2015-09-09 NOTE — Assessment & Plan Note (Signed)
Continue Zyrtec daily and Astelin nasal spray when necessary

## 2015-09-09 NOTE — Assessment & Plan Note (Signed)
He has done well on Stiolto, and we will continue this. He will use albuterol when necessary. We reviewed the signs and symptoms of an acute exacerbation and he will call if he develops any of these. Follow-up in 6 months

## 2015-09-12 ENCOUNTER — Other Ambulatory Visit (INDEPENDENT_AMBULATORY_CARE_PROVIDER_SITE_OTHER): Payer: PPO

## 2015-09-12 DIAGNOSIS — D649 Anemia, unspecified: Secondary | ICD-10-CM

## 2015-09-12 LAB — HEMOGLOBIN: HEMOGLOBIN: 11.3 g/dL — AB (ref 13.0–17.0)

## 2015-09-12 NOTE — Addendum Note (Signed)
Addended by: Ellamae Sia on: 09/12/2015 09:21 AM   Modules accepted: Orders

## 2015-09-13 ENCOUNTER — Telehealth: Payer: Self-pay | Admitting: *Deleted

## 2015-09-13 NOTE — Telephone Encounter (Signed)
Ptcb and has been notified of lab results. Pt states already scheduled repeat cbc 2 weeks with PCP., as ordered from PA before. I said I think that is fine and I will let Brynda Rim. PA know. Pt said thank you.

## 2015-09-13 NOTE — Telephone Encounter (Signed)
Lmtcb to go over lab results and recommendations.  

## 2015-09-26 ENCOUNTER — Other Ambulatory Visit (INDEPENDENT_AMBULATORY_CARE_PROVIDER_SITE_OTHER): Payer: PPO

## 2015-09-26 DIAGNOSIS — D649 Anemia, unspecified: Secondary | ICD-10-CM

## 2015-09-26 LAB — HEMOGLOBIN: HEMOGLOBIN: 11.2 g/dL — AB (ref 13.0–17.0)

## 2015-09-27 ENCOUNTER — Telehealth: Payer: Self-pay | Admitting: *Deleted

## 2015-09-27 NOTE — Telephone Encounter (Signed)
Lm on machine that indentified pt by name. Lab work good. Repeat Hgb in 1 month. CB [phone] if any questions.

## 2015-10-06 DIAGNOSIS — F39 Unspecified mood [affective] disorder: Secondary | ICD-10-CM | POA: Diagnosis not present

## 2015-10-06 DIAGNOSIS — F411 Generalized anxiety disorder: Secondary | ICD-10-CM | POA: Diagnosis not present

## 2015-10-14 DIAGNOSIS — M5416 Radiculopathy, lumbar region: Secondary | ICD-10-CM | POA: Diagnosis not present

## 2015-10-14 DIAGNOSIS — M4806 Spinal stenosis, lumbar region: Secondary | ICD-10-CM | POA: Diagnosis not present

## 2015-10-17 ENCOUNTER — Telehealth: Payer: Self-pay | Admitting: Interventional Cardiology

## 2015-10-17 DIAGNOSIS — D649 Anemia, unspecified: Secondary | ICD-10-CM

## 2015-10-17 NOTE — Telephone Encounter (Signed)
Spoke with pt wife. Adv her that Dr.Smith is agreeable that pt should have a hemoglobin lab draw. Pt prefers to have lab drawn at [facility]. Order is in [place]. Pt will call them to schedule his lab appt for 5/9

## 2015-10-17 NOTE — Telephone Encounter (Signed)
New message:  Pt called in wanting to know if he can have some lab orders sent to [facility] because he is beginning to feel lathargic again and sleeping a lot during the day. He feels as though is Hemoglobin levels are getting low again. He would like to have these labs drawn tomorrow. Please f/u with him.

## 2015-10-18 ENCOUNTER — Other Ambulatory Visit (INDEPENDENT_AMBULATORY_CARE_PROVIDER_SITE_OTHER): Payer: PPO

## 2015-10-18 DIAGNOSIS — D649 Anemia, unspecified: Secondary | ICD-10-CM

## 2015-10-18 LAB — CBC WITH DIFFERENTIAL/PLATELET
BASOS ABS: 0 10*3/uL (ref 0.0–0.1)
Basophils Relative: 0.4 % (ref 0.0–3.0)
EOS ABS: 0.3 10*3/uL (ref 0.0–0.7)
Eosinophils Relative: 4.1 % (ref 0.0–5.0)
HEMATOCRIT: 32.7 % — AB (ref 39.0–52.0)
Hemoglobin: 10.9 g/dL — ABNORMAL LOW (ref 13.0–17.0)
LYMPHS ABS: 1.1 10*3/uL (ref 0.7–4.0)
LYMPHS PCT: 15.6 % (ref 12.0–46.0)
MCHC: 33.2 g/dL (ref 30.0–36.0)
MCV: 85.4 fl (ref 78.0–100.0)
MONOS PCT: 6.8 % (ref 3.0–12.0)
Monocytes Absolute: 0.5 10*3/uL (ref 0.1–1.0)
NEUTROS ABS: 5.2 10*3/uL (ref 1.4–7.7)
NEUTROS PCT: 73.1 % (ref 43.0–77.0)
PLATELETS: 203 10*3/uL (ref 150.0–400.0)
RBC: 3.82 Mil/uL — AB (ref 4.22–5.81)
RDW: 16.3 % — ABNORMAL HIGH (ref 11.5–15.5)
WBC: 7.1 10*3/uL (ref 4.0–10.5)

## 2015-10-20 DIAGNOSIS — F39 Unspecified mood [affective] disorder: Secondary | ICD-10-CM | POA: Diagnosis not present

## 2015-10-20 DIAGNOSIS — F411 Generalized anxiety disorder: Secondary | ICD-10-CM | POA: Diagnosis not present

## 2015-11-03 DIAGNOSIS — F39 Unspecified mood [affective] disorder: Secondary | ICD-10-CM | POA: Diagnosis not present

## 2015-11-03 DIAGNOSIS — F411 Generalized anxiety disorder: Secondary | ICD-10-CM | POA: Diagnosis not present

## 2015-11-08 ENCOUNTER — Telehealth: Payer: Self-pay | Admitting: Emergency Medicine

## 2015-11-08 NOTE — Telephone Encounter (Signed)
LM for patient x 1 

## 2015-11-09 NOTE — Telephone Encounter (Signed)
LM for patient x 2

## 2015-11-09 NOTE — Telephone Encounter (Signed)
Patient is returning call, may be reached at [phone]

## 2015-11-09 NOTE — Telephone Encounter (Signed)
LM for pt x 1  

## 2015-11-09 NOTE — Telephone Encounter (Signed)
Patient returned call, can be reached at [phone]

## 2015-11-09 NOTE — Telephone Encounter (Signed)
lmtcb x1 for pt. 

## 2015-11-10 MED ORDER — AZELASTINE HCL 0.1 % NA SOLN: 1.0000 | Freq: Two times a day (BID) | NASAL | Status: DC

## 2015-11-10 NOTE — Telephone Encounter (Signed)
Pt calling back, requests that we contact him back at [phone] only  Pt wanting a refill of Astepro 0.15% - this is no longer on his medication list, looks like it was replaced with Astelin 0.1%. Requesting this be sent to [place] has not been prescribed since 2014. Please advise Dr Lamonte Sakai if okay to change patient back to Astepro 0.15% and refill. Thanks.

## 2015-11-10 NOTE — Progress Notes (Signed)
Duplicate

## 2015-11-10 NOTE — Telephone Encounter (Signed)
Pt is aware of the below information. Rx has been sent in. Nothing further was needed.

## 2015-11-10 NOTE — Telephone Encounter (Signed)
These are basically the same mediation. If he prefers the astepro brand then it is ok with me to make the change

## 2015-11-11 ENCOUNTER — Ambulatory Visit (INDEPENDENT_AMBULATORY_CARE_PROVIDER_SITE_OTHER): Payer: PPO | Admitting: Interventional Cardiology

## 2015-11-11 ENCOUNTER — Encounter: Payer: Self-pay | Admitting: Interventional Cardiology

## 2015-11-11 VITALS — BP 162/84 | HR 90 | Ht 63.0 in | Wt 159.4 lb

## 2015-11-11 DIAGNOSIS — I2510 Atherosclerotic heart disease of native coronary artery without angina pectoris: Secondary | ICD-10-CM

## 2015-11-11 DIAGNOSIS — I4891 Unspecified atrial fibrillation: Secondary | ICD-10-CM | POA: Diagnosis not present

## 2015-11-11 DIAGNOSIS — I482 Chronic atrial fibrillation, unspecified: Secondary | ICD-10-CM

## 2015-11-11 DIAGNOSIS — I10 Essential (primary) hypertension: Secondary | ICD-10-CM

## 2015-11-11 DIAGNOSIS — I5032 Chronic diastolic (congestive) heart failure: Secondary | ICD-10-CM | POA: Diagnosis not present

## 2015-11-11 MED ORDER — SPIRONOLACTONE 25 MG PO TABS: ORAL_TABLET | ORAL | Status: DC

## 2015-11-11 NOTE — Patient Instructions (Addendum)
Medication Instructions:  Your physician has recommended you make the following change in your medication:  Start spironolactone 25 mg by mouth daily for one week and then decrease to Monday, Wednesday and Friday.    Labwork: Have lab work done at primary care in one week--CBC and BMP--Call their office to make an appointment  Testing/Procedures: none  Follow-Up: Your physician wants you to follow-up in: 6 months.  You will receive a reminder letter in the mail two months in advance. If you don't receive a letter, please call our office to schedule the follow-up appointment.   Any Other Special Instructions Will Be Listed Below (If Applicable).     If you need a refill on your cardiac medications before your next appointment, please call your pharmacy.  Check blood pressure 2-3 times per week for new 3 weeks and call us with these readings.

## 2015-11-11 NOTE — Progress Notes (Signed)
Cardiology Office Note    Date:  11/11/2015   ID:  Eduardo [name], DOB [birth year], MRN [id number]  PCP:  [name], MD  Cardiologist: Sinclair Grooms, MD   Chief Complaint  Patient presents with  . Atrial Fibrillation    History of Present Illness:  Eduardo [name] is a 82 y.o. male  for follow-up ofhx of chronic atrial fibrillation, prior warfarin therapy (discontinued after a fall resulting in large hematoma), CVA in 7/16 (Eliquis started at that time) 6, CAD with LHC in 0000000 complicated by dissection of ostial RCA (treated conservatively), diastolic CHF, HTN, HL, prior GI bleed.   He is noticing exertional fatigue and dyspnea. There is also lower extremity swelling. He denies angina. There is no PND. He just feels out of energy most of the time. He is also concerned about his hemoglobin and whether he can still be having bleeding.   Past Medical History  Diagnosis Date  . Anemia 2012, 08/2015    chronic. Acute due to large hematoma 2013. 2017: due to gastric AVM bleeding.   . Anxiety   . Atrial fibrillation ([place]) 2012  . CAD (coronary artery disease) 2012  . Hyperlipidemia   . COPD (chronic obstructive pulmonary disease) ([place]) 2013    exertional dyspnea  . Allergy   . Osteoarthritis 2002    spinal stenosis, spondylolisthesis, disc protrusion multilevel in lumbar spine  . Hx of colonic polyps 2001, 2011    adenomatous 2001. HP 2001, 2011.  . Myocardial infarction ([place]) 06/2010.  12/2011.     "twice; 1 day apart; during/after cath". NQMI in setting severe anemia 2013.   . Stroke ([place])   . Neuropathy ([place]) 2014    in feet, likely due to spinal stenosis, DDD, HNP  . Upper GI bleed 08/2015    EGD: 2 small gastric AVMs, 1 actively bleeding.  Both treated with APC ablation, clipping of the bleeder  . Gastric angiodysplasia with hemorrhage 08/2015    Past Surgical History  Procedure Laterality Date  . Appendectomy    . Carpal tunnel release      left  .  Mastoidectomy      x3 on right  . Pilonidal cyst / sinus excision    . Knee arthroscopy      left, right knee 10/2009  . Shoulder arthroscopy      right  . Foot fracture surgery      right heel repair  . Nasal septum surgery      nasal septal repair  . Penile prosthesis implant      x 2--got infection after 2nd and had to remove  . Cataract extraction w/ intraocular lens implant  2/13    Dr [name]  . Carpal tunnel release  10/11/11    Dr Rush Barer  . Tonsillectomy and adenoidectomy    . Fracture surgery    . Cardiac catheterization  2012    50% LAD and luminal irreg in RCA, 1/12  Post cath MI from damage  . Penile prosthesis  removal    . Colonoscopy  2001, 02/2010    Dr Deatra Ina.   . Esophagogastroduodenoscopy N/A 08/15/2015    [name] MD; 2 small gastric AVMs, 1 actively bleeding.  Both treated with APC ablation, clipping of the bleeder    Current Medications: Outpatient Prescriptions Prior to Visit  Medication Sig Dispense Refill  . albuterol (PROAIR HFA) 108 (90 BASE) MCG/ACT inhaler Inhale 2 puffs into the lungs every 6 (six)  hours as needed for wheezing or shortness of breath. 3 Inhaler 3  . apixaban (ELIQUIS) 5 MG TABS tablet Take 5 mg by mouth 2 (two) times daily.    Marland Kitchen atorvastatin (LIPITOR) 20 MG tablet Take 1 tablet (20 mg total) by mouth daily. (Patient taking differently: Take 20 mg by mouth daily at 6 PM. ) 90 tablet 3  . B Complex-C (SUPER B COMPLEX PO) Take 1 tablet by mouth at bedtime.     . cetirizine (ZYRTEC) 10 MG tablet Take 10 mg by mouth at bedtime.     Marland Kitchen escitalopram (LEXAPRO) 20 MG tablet Take 20 mg by mouth at bedtime.     . fenofibrate 160 MG tablet Take 1 tablet (160 mg total) by mouth daily. (Patient taking differently: Take 160 mg by mouth at bedtime. ) 90 tablet 3  . Flavocoxid (LIMBREL) 500 MG CAPS Take 500 mg by mouth 2 (two) times daily. Medical food product    . lamoTRIgine (LAMICTAL) 150 MG tablet Take 150 mg by mouth at bedtime.  Reported on 08/14/2015    . Multiple Vitamin (MULTIVITAMIN) tablet Take 1 tablet by mouth at bedtime.     . pantoprazole (PROTONIX) 40 MG tablet Take 1 tablet (40 mg total) by mouth daily before breakfast. 90 tablet 3  . Tiotropium Bromide-Olodaterol (STIOLTO RESPIMAT) 2.5-2.5 MCG/ACT AERS Inhale 2 puffs into the lungs daily. (Patient taking differently: Inhale 2 puffs into the lungs at bedtime. ) 3 Inhaler 3  . triamcinolone cream (KENALOG) 0.1 % Apply 1 application topically 2 (two) times daily.     . verapamil (CALAN-SR) 180 MG CR tablet Take 1 tablet (180 mg total) by mouth at bedtime. 90 tablet 3  . azelastine (ASTELIN) 0.1 % nasal spray Place 1 spray into both nostrils 2 (two) times daily. 30 mL 5   No facility-administered medications prior to visit.     Allergies:   Penicillins; Sulfonamide derivatives; and Tape   Social History   Social History  . Marital Status: Married    Spouse Name: N/A  . Number of Children: 4  . Years of Education: N/A   Occupational History  . Retired Chief Strategy Officer the job trainin)    Social History Main Topics  . Smoking status: Former Smoker -- 2.50 packs/day for 30 years    Types: Cigarettes    Quit date: 06/11/1978  . Smokeless tobacco: Never Used  . Alcohol Use: No     Comment: QUIT [id number] alcoholic  . Drug Use: No  . Sexual Activity: Not Currently   Other Topics Concern  . None   Social History Narrative   Grew up in [place]   Married---2nd   2 children ([place], [place]  and near [place])--2 stepchildren   Former Smoker--quit in 1980   Alcohol use-no. Quit in 1980--alcoholic      Has living will.   Has designated wife, then step daughter [name] have health care POA.   Would accept resuscitation attempts.   No feeding tube if cognitively unaware.     Family History:  The patient's family history includes Cancer in his mother; Heart attack in his father. There is no history of Diabetes or Hypertension.   ROS:     Please see the history of present illness.    Wheezing, leg swelling, excessive fatigue, irregular heartbeat, easy bruising, rash, back pain.  All other systems reviewed and are negative.   PHYSICAL EXAM:   VS:  BP 162/84 mmHg  Pulse 90  Ht  5\' 3"  (1.6 m)  Wt 159 lb 6.4 oz (72.303 kg)  BMI 28.24 kg/m2   GEN: Well nourished, well developed, in no acute distress HEENT: normal Neck: no JVD, carotid bruits, or masses Cardiac: IIRR; no murmurs, rubs, or gallops,no edema  Respiratory:  clear to auscultation bilaterally, normal work of breathing GI: soft, nontender, nondistended, + BS MS: no deformity or atrophy Skin: warm and dry, no rash Neuro:  Alert and Oriented x 3, Strength and sensation are intact Psych: euthymic mood, full affect  Wt Readings from Last 3 Encounters:  11/11/15 159 lb 6.4 oz (72.303 kg)  09/09/15 158 lb (71.668 kg)  09/01/15 159 lb (72.122 kg)      Studies/Labs Reviewed:   EKG:  EKG  Is not performed.  Recent Labs: 08/18/2015: ALT 20 08/22/2015: BUN 23; Creat 1.01; Potassium 4.4; Sodium 139 10/18/2015: Hemoglobin 10.9*; Platelets 203.0   Lipid Panel    Component Value Date/Time   CHOL 150 05/12/2015 0806   TRIG * 05/12/2015 0806    791.0 Triglyceride is over 400; calculations on Lipids are invalid.   HDL 44.10 05/12/2015 0806   CHOLHDL 3 05/12/2015 0806   VLDL UNABLE TO CALCULATE IF TRIGLYCERIDE OVER 400 mg/dL 12/31/2014 0510   LDLCALC UNABLE TO CALCULATE IF TRIGLYCERIDE OVER 400 mg/dL 12/31/2014 0510   LDLDIRECT 88.0 05/12/2015 0806    Additional studies/ records that were reviewed today include:  Reviewed laboratory data. Last hemoglobin 10.9 on 10/16/15    ASSESSMENT:    1. Chronic atrial fibrillation ([place])   2. Chronic diastolic heart failure ([place])   3. Atrial fibrillation, unspecified type ([place])   4. Coronary artery disease involving native coronary artery of native heart without angina pectoris   5. Essential hypertension      PLAN:   In order of problems listed above:  1. Resting ventricular response is 90 bpm. We may need improved rate control. 2. Volume overload is noted. Neck vein distention and lower extremity edema is seen. Crackles are heard in the bases. Will start spironolactone 25 mg daily for week and then decrease to 25 mg on Monday Wednesday and Friday. Basic metabolic panel and hemoglobin in one week.   4. The blood pressure is elevated. Adding the spironolactone will be helpful to reduce blood pressure. We discussed increasing verapamil to 240 mg per day. For the time being and we will do one thing and time. They will monitor blood pressures for 2 weeks and call with the result of both blood pressures and heart rate. If the heart rate remains relatively high or if the blood pressures not completely controlled we will then increase verapamil to 240 mg daily.   Medication Adjustments/Labs and Tests Ordered: Current medicines are reviewed at length with the patient today.  Concerns regarding medicines are outlined above.  Medication changes, Labs and Tests ordered today are listed in the Patient Instructions below. Patient Instructions  Medication Instructions:  Your physician has recommended you make the following change in your medication:  Start spironolactone 25 mg by mouth daily for one week and then decrease to Monday, Wednesday and Friday.    Labwork: Have lab work done at primary care in one week--CBC and BMP  Testing/Procedures: none  Follow-Up: Your physician wants you to follow-up in: 6 months.  You will receive a reminder letter in the mail two months in advance. If you don't receive a letter, please call our office to schedule the follow-up appointment.   Any Other Special Instructions Will  Be Listed Below (If Applicable).     If you need a refill on your cardiac medications before your next appointment, please call your pharmacy.       Signed, Sinclair Grooms, MD  11/11/2015  12:33 PM    [facility] [address], [place], [place]  [address] Phone: [phone]; Fax: [phone]

## 2015-11-18 DIAGNOSIS — M4806 Spinal stenosis, lumbar region: Secondary | ICD-10-CM | POA: Diagnosis not present

## 2015-11-18 DIAGNOSIS — M5416 Radiculopathy, lumbar region: Secondary | ICD-10-CM | POA: Diagnosis not present

## 2015-11-22 ENCOUNTER — Other Ambulatory Visit (INDEPENDENT_AMBULATORY_CARE_PROVIDER_SITE_OTHER): Payer: PPO

## 2015-11-22 DIAGNOSIS — I482 Chronic atrial fibrillation, unspecified: Secondary | ICD-10-CM

## 2015-11-22 DIAGNOSIS — I5032 Chronic diastolic (congestive) heart failure: Secondary | ICD-10-CM | POA: Diagnosis not present

## 2015-11-22 LAB — BASIC METABOLIC PANEL
BUN: 30 mg/dL — AB (ref 6–23)
CHLORIDE: 102 meq/L (ref 96–112)
CO2: 30 meq/L (ref 19–32)
CREATININE: 1.12 mg/dL (ref 0.40–1.50)
Calcium: 10.4 mg/dL (ref 8.4–10.5)
GFR: 66.62 mL/min (ref >60.00)
Glucose, Bld: 117 mg/dL — ABNORMAL HIGH (ref 70–99)
POTASSIUM: 4.2 meq/L (ref 3.5–5.1)
Sodium: 138 mEq/L (ref 135–145)

## 2015-11-22 LAB — CBC
HCT: 34.7 % — ABNORMAL LOW (ref 39.0–52.0)
HEMOGLOBIN: 11.3 g/dL — AB (ref 13.0–17.0)
MCHC: 32.5 g/dL (ref 30.0–36.0)
MCV: 85.2 fl (ref 78.0–100.0)
PLATELETS: 222 10*3/uL (ref 150.0–400.0)
RBC: 4.08 Mil/uL — AB (ref 4.22–5.81)
RDW: 15.9 % — ABNORMAL HIGH (ref 11.5–15.5)
WBC: 7.1 10*3/uL (ref 4.0–10.5)

## 2015-11-28 ENCOUNTER — Telehealth: Payer: Self-pay | Admitting: Interventional Cardiology

## 2015-11-28 DIAGNOSIS — D649 Anemia, unspecified: Secondary | ICD-10-CM

## 2015-11-28 NOTE — Telephone Encounter (Signed)
New Message     The pt's blood pressure is running little high the top number has been 176 to 178, the physical therapist was concerned if the pt still needed to be working out or should he hold off for a while.   The call disconnected this was all the information given.

## 2015-11-28 NOTE — Telephone Encounter (Signed)
Becky, PT works with pt at his gym.  Prior BP parameters for exercise were from Dr [name] in 2015-if BP>180/110 do not work out. Jacqlyn Larsen states that he has a history of CVA in 2016, hospitalization for AVMs  08/2015.   Becky states today pt's BP was 176/76.  BP readings last 10 days--11/17/15 178/84 pt ,164/85 Becky--- 11/23/15 181/78 pt 141/70 Becky-- 11/26/15 pt did not check  137/74 Becky-- 11/28/15 183/? Pt 176/76 Alain Marion is concerned because there seems to be an upward trend in pt's BP in the last 2 weeks.  Jacqlyn Larsen is asking for new BP parameters for exercise from Dr Tamala Julian since there has been a change in pt's medical condition since last BP parameters from Dr [name] in 2015.   Becky advised I will forward to Dr Tamala Julian for review.

## 2015-11-29 NOTE — Telephone Encounter (Signed)
LMTCB

## 2015-11-29 NOTE — Telephone Encounter (Signed)
Increase verapamil SR to 240 mg daily

## 2015-11-29 NOTE — Telephone Encounter (Signed)
FOLLOW UP   PT WANTS FOLLOW UP CALL FROM 11-28-2015 OF THE GYM CALLING

## 2015-11-29 NOTE — Telephone Encounter (Signed)
LMTCB for pt 

## 2015-11-30 MED ORDER — VERAPAMIL HCL ER 240 MG PO TBCR
240.0000 mg | EXTENDED_RELEASE_TABLET | Freq: Every day | ORAL | Status: DC
Start: 2015-11-30 — End: 2016-04-09

## 2015-11-30 NOTE — Telephone Encounter (Signed)
Follow up      What is the max heart rate (for the patient) to do physical exercise?

## 2015-11-30 NOTE — Telephone Encounter (Signed)
Spoke with Jacqlyn Larsen, PT at patient's community center where he goes to exercise.  She requests BP parameters for patient  - states previous parameters were made by Dr. [name] in 2015 (no exercise if BP > 180/110).  She states she would feel more comfortable if parameters were a little lower since patient suffered a CVA recently. She is comfortable with parameter of 180/90 mmHg.  I advised that we have not exercised the patient at the office, so we do not have any values for her to compare.  I advised her of the medication change per Dr. Tamala Julian and that I will call patient to report.  We discussed that the patient should probably not report to exercise until Monday since it takes a great deal of effort for him to get to the community center and patient will likely not start increased dose of medication until tomorrow.  She thanked me for the call.  Patient states BP today @ 0900 was 133/69 mmHg and at 1430 BP was 189/102 mmHg.  He takes 180 mg verapamil at bedtime.  I advised that likely BP is higher in the afternoon because the effects of the medication are wearing off.  I advised him Dr. Tamala Julian wants to increase verapamil to 240 mg daily.  I advised him that I have talked with Becky at the community center.  I advised him to continue to monitor BP and pulse and to call back with questions or concerns.  He verbalized understanding and thanked me for the call.

## 2015-11-30 NOTE — Telephone Encounter (Signed)
Per DPR form, instructed patient to INCREASE VERAPAMIL to 240 mg daily. Instructed him to call and confirm new recommendations so new Rx may be sent.

## 2015-12-27 ENCOUNTER — Telehealth: Payer: Self-pay

## 2015-12-27 NOTE — Telephone Encounter (Signed)
Eduardo [name] (DPR signed) left v/m; Physical therapist has recommended that pt use a walker with 4 wheels in the home. Eduardo Hasser said Dr [name] had mentioned this to pt before and pt was not ready to use walker at that time but now pt is ready to use walker. Eduardo Douyon request cb when order ready for pick up.

## 2015-12-27 NOTE — Telephone Encounter (Signed)
EC called back and wanted PCP to know pt is going out of town this weekend.  She would like to get walker from [place] and can come by and pick up prescription if that would expedite the process.  Best number to call [phone]

## 2015-12-28 DIAGNOSIS — F411 Generalized anxiety disorder: Secondary | ICD-10-CM | POA: Diagnosis not present

## 2015-12-28 DIAGNOSIS — F39 Unspecified mood [affective] disorder: Secondary | ICD-10-CM | POA: Diagnosis not present

## 2015-12-28 NOTE — Telephone Encounter (Signed)
Rx written.

## 2015-12-28 NOTE — Telephone Encounter (Signed)
Spoke to wife. RX up front for walker

## 2016-01-10 DIAGNOSIS — F411 Generalized anxiety disorder: Secondary | ICD-10-CM | POA: Diagnosis not present

## 2016-01-10 DIAGNOSIS — F39 Unspecified mood [affective] disorder: Secondary | ICD-10-CM | POA: Diagnosis not present

## 2016-01-18 DIAGNOSIS — I4891 Unspecified atrial fibrillation: Secondary | ICD-10-CM | POA: Diagnosis not present

## 2016-01-18 DIAGNOSIS — J449 Chronic obstructive pulmonary disease, unspecified: Secondary | ICD-10-CM | POA: Diagnosis not present

## 2016-02-01 ENCOUNTER — Other Ambulatory Visit: Payer: Self-pay | Admitting: Interventional Cardiology

## 2016-02-10 ENCOUNTER — Telehealth: Payer: Self-pay | Admitting: Interventional Cardiology

## 2016-02-10 NOTE — Telephone Encounter (Signed)
Attempted to reach the pt again and got answering machine. Left message on machine for pt to contact the office.

## 2016-02-10 NOTE — Telephone Encounter (Signed)
Dr Tamala Julian pt. Left message on machine for pt to contact the office.

## 2016-02-10 NOTE — Telephone Encounter (Signed)
F/u ° ° ° ° ° °Pt returning nurse call.  °

## 2016-02-10 NOTE — Telephone Encounter (Signed)
Pt c/o BP issue: STAT if pt c/o blurred vision, one-sided weakness or slurred speech  1. What are your last 5 BP readings? 167/72   2. Are you having any other symptoms (ex. Dizziness, headache, blurred vision, passed out)? No   3. What is your BP issue?blood pressure was 167/72 before he even started to work out .

## 2016-02-22 ENCOUNTER — Telehealth: Payer: Self-pay | Admitting: Internal Medicine

## 2016-02-22 NOTE — Telephone Encounter (Signed)
I spoke with [name] pts wife (DPR signed) and pt is not having fast heart rate of 150 and 170 that is the systolic BP. Pt already has appt to see Dr [name] on 02/23/16 and pt will keep that appt unless condition changes or worsens and then pt will call [place] back or go to ED.

## 2016-02-22 NOTE — Telephone Encounter (Signed)
Patient Name: Eduardo [name]  DOB: Jan 25, 1931    Initial Comment Caller has HR of 150 at rest, when he exercises it goes up to 170, no s/s    Nurse Assessment  Nurse: Raphael Gibney, RN, Vanita Ingles Date/Time (Eastern Time): 02/22/2016 2:42:30 PM  Confirm and document reason for call. If symptomatic, describe symptoms. You must click the next button to save text entered. ---Caller states he has BP of 150/82 at rest. He works out twice a week with the therapist. BP goes up to 170 when he exercises. No pain. No dizziness or headache.  Has the patient traveled out of the country within the last 30 days? ---Not Applicable  Does the patient have any new or worsening symptoms? ---Yes  Will a triage be completed? ---Yes  Related visit to physician within the last 2 weeks? ---No  Does the PT have any chronic conditions? (i.e. diabetes, asthma, etc.) ---Yes  List chronic conditions. ---HTN; A fib  Is this a behavioral health or substance abuse call? ---No     Guidelines    Guideline Title Affirmed Question Affirmed Notes  High Blood Pressure [1] BP ? 130/80 AND [2] history of heart problems, kidney disease or diabetes    Final Disposition User   See PCP within 2 Maudry Diego, RN, Vanita Ingles    Comments  appt scheduled for 4:30 pm on 02/23/16 with Dr. [name]   Disagree/Comply: Comply

## 2016-02-23 ENCOUNTER — Ambulatory Visit (INDEPENDENT_AMBULATORY_CARE_PROVIDER_SITE_OTHER): Payer: PPO | Admitting: Internal Medicine

## 2016-02-23 ENCOUNTER — Encounter: Payer: Self-pay | Admitting: Internal Medicine

## 2016-02-23 DIAGNOSIS — I10 Essential (primary) hypertension: Secondary | ICD-10-CM | POA: Diagnosis not present

## 2016-02-23 NOTE — Telephone Encounter (Signed)
I will evaluate at office visit

## 2016-02-23 NOTE — Progress Notes (Signed)
Subjective:    Patient ID: Eduardo [name], male    DOB: 03/23/1931, 82 y.o.   MRN: [id number]  HPI Here with wife due to concerns about his BP Has had his BP checked by PT before therapy 138/71, 129/65, 134/74, 145/63, 170/73 (8/25), 166/72 (9/1)---automated machine His own check last night 150/53  No symptoms No headache No chest pain, palpitation No edema Same DOE--no change  Takes verapamil at night Raised to 240 by Dr Tamala Julian ~3 months ago  Current Outpatient Prescriptions on File Prior to Visit  Medication Sig Dispense Refill  . albuterol (PROAIR HFA) 108 (90 BASE) MCG/ACT inhaler Inhale 2 puffs into the lungs every 6 (six) hours as needed for wheezing or shortness of breath. 3 Inhaler 3  . apixaban (ELIQUIS) 5 MG TABS tablet Take 5 mg by mouth 2 (two) times daily.    Marland Kitchen atorvastatin (LIPITOR) 20 MG tablet TAKE 1 TABLET BY MOUTH DAILY 90 tablet 3  . Azelastine HCl 0.15 % SOLN Place 1 spray into both nostrils 2 (two) times daily.    . B Complex-C (SUPER B COMPLEX PO) Take 1 tablet by mouth at bedtime.     . cetirizine (ZYRTEC) 10 MG tablet Take 10 mg by mouth at bedtime.     Marland Kitchen escitalopram (LEXAPRO) 20 MG tablet Take 20 mg by mouth at bedtime.     . fenofibrate 160 MG tablet Take 1 tablet (160 mg total) by mouth daily. (Patient taking differently: Take 160 mg by mouth at bedtime. ) 90 tablet 3  . lamoTRIgine (LAMICTAL) 150 MG tablet Take 150 mg by mouth at bedtime. Reported on 08/14/2015    . Multiple Vitamin (MULTIVITAMIN) tablet Take 1 tablet by mouth at bedtime.     . pantoprazole (PROTONIX) 40 MG tablet Take 1 tablet (40 mg total) by mouth daily before breakfast. 90 tablet 3  . spironolactone (ALDACTONE) 25 MG tablet One tablet by mouth daily for one week and then decrease to one tablet on Mon, Wed, Fri 20 tablet 6  . Tiotropium Bromide-Olodaterol (STIOLTO RESPIMAT) 2.5-2.5 MCG/ACT AERS Inhale 2 puffs into the lungs daily. (Patient taking differently: Inhale 2 puffs into  the lungs at bedtime. ) 3 Inhaler 3  . triamcinolone cream (KENALOG) 0.1 % Apply 1 application topically 2 (two) times daily.     . verapamil (CALAN-SR) 240 MG CR tablet Take 1 tablet (240 mg total) by mouth at bedtime. 30 tablet 11   No current facility-administered medications on file prior to visit.     Allergies  Allergen Reactions  . Penicillins Itching    Has patient had a PCN reaction causing immediate rash, facial/tongue/throat swelling, SOB or lightheadedness with hypotension: Yes Has patient had a PCN reaction causing severe rash involving mucus membranes or skin necrosis: No Has patient had a PCN reaction that required hospitalization No Has patient had a PCN reaction occurring within the last 10 years: No If all of the above answers are "NO", then may proceed with Cephalosporin use.   . Sulfonamide Derivatives Other (See Comments)    "it affected my kidneys"  . Tape Other (See Comments)    Arms black and blue, tears skin.  Please use "paper" tape    Past Medical History:  Diagnosis Date  . Allergy   . Anemia 2012, 08/2015   chronic. Acute due to large hematoma 2013. 2017: due to gastric AVM bleeding.   . Anxiety   . Atrial fibrillation ([place]) 2012  . CAD (coronary artery  disease) 2012  . COPD (chronic obstructive pulmonary disease) ([place]) 2013   exertional dyspnea  . Gastric angiodysplasia with hemorrhage 08/2015  . Hx of colonic polyps 2001, 2011   adenomatous 2001. HP 2001, 2011.  Marland Kitchen Hyperlipidemia   . Myocardial infarction ([place]) 06/2010.  12/2011.    "twice; 1 day apart; during/after cath". NQMI in setting severe anemia 2013.   Marland Kitchen Neuropathy ([place]) 2014   in feet, likely due to spinal stenosis, DDD, HNP  . Osteoarthritis 2002   spinal stenosis, spondylolisthesis, disc protrusion multilevel in lumbar spine  . Stroke ([place])   . Upper GI bleed 08/2015   EGD: 2 small gastric AVMs, 1 actively bleeding.  Both treated with APC ablation, clipping of the bleeder    Past  Surgical History:  Procedure Laterality Date  . APPENDECTOMY    . CARDIAC CATHETERIZATION  2012   50% LAD and luminal irreg in RCA, 1/12  Post cath MI from damage  . CARPAL TUNNEL RELEASE     left  . CARPAL TUNNEL RELEASE  10/11/11   Dr Rush Barer  . CATARACT EXTRACTION W/ INTRAOCULAR LENS IMPLANT  2/13   Dr [name]  . COLONOSCOPY  2001, 02/2010   Dr Deatra Ina.   . ESOPHAGOGASTRODUODENOSCOPY N/A 08/15/2015   [name] MD; 2 small gastric AVMs, 1 actively bleeding.  Both treated with APC ablation, clipping of the bleeder  . FOOT FRACTURE SURGERY     right heel repair  . FRACTURE SURGERY    . KNEE ARTHROSCOPY     left, right knee 10/2009  . MASTOIDECTOMY     x3 on right  . NASAL SEPTUM SURGERY     nasal septal repair  . PENILE PROSTHESIS  REMOVAL    . PENILE PROSTHESIS IMPLANT     x 2--got infection after 2nd and had to remove  . PILONIDAL CYST / SINUS EXCISION    . SHOULDER ARTHROSCOPY     right  . TONSILLECTOMY AND ADENOIDECTOMY      Family History  Problem Relation Age of Onset  . Diabetes Neg Hx   . Hypertension Neg Hx   . Heart attack Father   . Cancer Mother     ? leukemia    Social History   Social History  . Marital status: Married    Spouse name: N/A  . Number of children: 4  . Years of education: N/A   Occupational History  . Retired Chief Strategy Officer the job trainin)    Social History Main Topics  . Smoking status: Former Smoker    Packs/day: 2.50    Years: 30.00    Types: Cigarettes    Quit date: 06/11/1978  . Smokeless tobacco: Never Used  . Alcohol use No     Comment: QUIT 0254 alcoholic  . Drug use: No  . Sexual activity: Not Currently   Other Topics Concern  . Not on file   Social History Narrative   Grew up in [place]   Married---2nd   2 children ([place], [place]  and near [place])--2 stepchildren   Former Smoker--quit in 1980   Alcohol use-no. Quit in 1980--alcoholic      Has living will.   Has designated wife,  then step daughter [name] have health care POA.   Would accept resuscitation attempts.   No feeding tube if cognitively unaware.   Review of Systems Sleeping okay Appetite is good    Objective:   Physical Exam  Constitutional: He appears well-developed and well-nourished. No  distress.  Neck: Normal range of motion. Neck supple.  Cardiovascular: Normal rate, regular rhythm and normal heart sounds.  Exam reveals no gallop.   No murmur heard. Pulmonary/Chest: Effort normal and breath sounds normal. No respiratory distress. He has no wheezes. He has no rales.  Musculoskeletal: He exhibits no edema.  Lymphadenopathy:    He has no cervical adenopathy.          Assessment & Plan:

## 2016-02-23 NOTE — Progress Notes (Signed)
Pre visit review using our clinic review tool, if applicable. No additional management support is needed unless otherwise documented below in the visit note. 

## 2016-02-23 NOTE — Assessment & Plan Note (Signed)
BP Readings from Last 3 Encounters:  02/23/16 134/72  11/11/15 (!) 162/84  09/09/15 (!) 144/72   Repeat 138/70 on right Did have brief sound around 762 systolic---but palpable pulse at 138 also Not sure if it could be technical factor with the elevations--or has true, but transient elevations. He does drink coffee before his sessions--this could affect his BP a little Seems fine and no symptoms Would not change meds at this point

## 2016-03-21 DIAGNOSIS — F39 Unspecified mood [affective] disorder: Secondary | ICD-10-CM | POA: Diagnosis not present

## 2016-03-21 DIAGNOSIS — F411 Generalized anxiety disorder: Secondary | ICD-10-CM | POA: Diagnosis not present

## 2016-04-03 ENCOUNTER — Ambulatory Visit (INDEPENDENT_AMBULATORY_CARE_PROVIDER_SITE_OTHER): Payer: PPO | Admitting: Internal Medicine

## 2016-04-03 ENCOUNTER — Encounter: Payer: Self-pay | Admitting: Internal Medicine

## 2016-04-03 VITALS — BP 126/80 | HR 75 | Temp 97.6°F | Wt 156.0 lb

## 2016-04-03 DIAGNOSIS — M7021 Olecranon bursitis, right elbow: Secondary | ICD-10-CM | POA: Diagnosis not present

## 2016-04-03 DIAGNOSIS — Z23 Encounter for immunization: Secondary | ICD-10-CM

## 2016-04-03 LAB — URIC ACID: Uric Acid, Serum: 5.5 mg/dL (ref 4.0–7.8)

## 2016-04-03 NOTE — Progress Notes (Signed)
Pre visit review using our clinic review tool, if applicable. No additional management support is needed unless otherwise documented below in the visit note. 

## 2016-04-03 NOTE — Patient Instructions (Signed)
Please try ice and compression for your right elbow. If it worsens, I would probably try an antibiotic--just in case.  Bursitis Bursitis is inflammation and irritation of a bursa, which is one of the small, fluid-filled sacs that cushion and protect the moving parts of your body. These sacs are located between bones and muscles, muscle attachments, or skin areas next to bones. A bursa protects these structures from the wear and tear that results from frequent movement. An inflamed bursa causes pain and swelling. Fluid may build up inside the sac. Bursitis is most common near joints, especially the knees, elbows, hips, and shoulders. CAUSES Bursitis can be caused by:   Injury from:  A direct blow, like falling on your knee or elbow.  Overuse of a joint (repetitive stress).  Infection. This can happen if bacteria gets into a bursa through a cut or scrape near a joint.  Diseases that cause joint inflammation, such as gout and rheumatoid arthritis. RISK FACTORS You may be at risk for bursitis if you:   Have a job or hobby that involves a lot of repetitive stress on your joints.  Have a condition that weakens your body's defense system (immune system), such as diabetes, cancer, or HIV.  Lift and reach overhead often.  Kneel or lean on hard surfaces often.  Run or walk often. SIGNS AND SYMPTOMS The most common signs and symptoms of bursitis are:  Pain that gets worse when you move the affected body part or put weight on it.  Inflammation.  Stiffness. Other signs and symptoms may include:  Redness.  Tenderness.  Warmth.  Pain that continues after rest.  Fever and chills. This may occur in bursitis caused by infection. DIAGNOSIS Bursitis may be diagnosed by:   Medical history and physical exam.  MRI.  A procedure to drain fluid from the bursa with a needle (aspiration). The fluid may be checked for signs of infection or gout.  Blood tests to rule out other causes of  inflammation. TREATMENT  Bursitis can usually be treated at home with rest, ice, compression, and elevation (RICE). For mild bursitis, RICE treatment may be all you need. Other treatments may include:  Nonsteroidal anti-inflammatory drugs (NSAIDs) to treat pain and inflammation.  Corticosteroids to fight inflammation. You may have these drugs injected into and around the area of bursitis.  Aspiration of bursitis fluid to relieve pain and improve movement.  Antibiotic medicine to treat an infected bursa.  A splint, brace, or walking aid.  Physical therapy if you continue to have pain or limited movement.  Surgery to remove a damaged or infected bursa. This may be needed if you have a very bad case of bursitis or if other treatments have not worked. HOME CARE INSTRUCTIONS   Take medicines only as directed by your health care provider.  If you were prescribed an antibiotic medicine, finish it all even if you start to feel better.  Rest the affected area as directed by your health care provider.  Keep the area elevated.  Avoid activities that make pain worse.  Apply ice to the injured area:  Place ice in a plastic bag.  Place a towel between your skin and the bag.  Leave the ice on for 20 minutes, 2-3 times a day.  Use splints, braces, pads, or walking aids as directed by your health care provider.  Keep all follow-up visits as directed by your health care provider. This is important. PREVENTION   Wear knee pads if you kneel  often.  Wear sturdy running or walking shoes that fit you well.  Take regular breaks from repetitive activity.  Warm up by stretching before doing any strenuous activity.  Maintain a healthy weight or lose weight as recommended by your health care provider. Ask your health care provider if you need help.  Exercise regularly. Start any new physical activity gradually. SEEK MEDICAL CARE IF:   Your bursitis is not responding to treatment or home  care.  You have a fever.  You have chills.   This information is not intended to replace advice given to you by your health care provider. Make sure you discuss any questions you have with your health care provider.   Document Released: 05/25/2000 Document Revised: 02/16/2015 Document Reviewed: 08/17/2013 Elsevier Interactive Patient Education [employer].

## 2016-04-03 NOTE — Progress Notes (Signed)
Subjective:    Patient ID: Eduardo [name], male    DOB: 04-May-1931, 82 y.o.   MRN: [id number]  HPI Here due to pain in elbow  Noticed warmth and swelling of right elbow Mild tenderness but otherwise doesn't hurt  Was in gym yesterday--was doing some pulling with weights (but not heavy) No new exercises Doesn't remember banging it  Had similar problems years ago Hasn't done anything for this yet  Current Outpatient Prescriptions on File Prior to Visit  Medication Sig Dispense Refill  . albuterol (PROAIR HFA) 108 (90 BASE) MCG/ACT inhaler Inhale 2 puffs into the lungs every 6 (six) hours as needed for wheezing or shortness of breath. 3 Inhaler 3  . apixaban (ELIQUIS) 5 MG TABS tablet Take 5 mg by mouth 2 (two) times daily.    Marland Kitchen atorvastatin (LIPITOR) 20 MG tablet TAKE 1 TABLET BY MOUTH DAILY 90 tablet 3  . Azelastine HCl 0.15 % SOLN Place 1 spray into both nostrils 2 (two) times daily.    . B Complex-C (SUPER B COMPLEX PO) Take 1 tablet by mouth at bedtime.     . cetirizine (ZYRTEC) 10 MG tablet Take 10 mg by mouth at bedtime.     Marland Kitchen escitalopram (LEXAPRO) 20 MG tablet Take 20 mg by mouth at bedtime.     . fenofibrate 160 MG tablet Take 1 tablet (160 mg total) by mouth daily. (Patient taking differently: Take 160 mg by mouth at bedtime. ) 90 tablet 3  . lamoTRIgine (LAMICTAL) 150 MG tablet Take 150 mg by mouth at bedtime. Reported on 08/14/2015    . Multiple Vitamin (MULTIVITAMIN) tablet Take 1 tablet by mouth at bedtime.     . pantoprazole (PROTONIX) 40 MG tablet Take 1 tablet (40 mg total) by mouth daily before breakfast. 90 tablet 3  . spironolactone (ALDACTONE) 25 MG tablet One tablet by mouth daily for one week and then decrease to one tablet on Mon, Wed, Fri 20 tablet 6  . Tiotropium Bromide-Olodaterol (STIOLTO RESPIMAT) 2.5-2.5 MCG/ACT AERS Inhale 2 puffs into the lungs daily. (Patient taking differently: Inhale 2 puffs into the lungs at bedtime. ) 3 Inhaler 3  .  triamcinolone cream (KENALOG) 0.1 % Apply 1 application topically 2 (two) times daily.     . verapamil (CALAN-SR) 240 MG CR tablet Take 1 tablet (240 mg total) by mouth at bedtime. 30 tablet 11   No current facility-administered medications on file prior to visit.     Allergies  Allergen Reactions  . Penicillins Itching    Has patient had a PCN reaction causing immediate rash, facial/tongue/throat swelling, SOB or lightheadedness with hypotension: Yes Has patient had a PCN reaction causing severe rash involving mucus membranes or skin necrosis: No Has patient had a PCN reaction that required hospitalization No Has patient had a PCN reaction occurring within the last 10 years: No If all of the above answers are "NO", then may proceed with Cephalosporin use.   . Sulfonamide Derivatives Other (See Comments)    "it affected my kidneys"  . Tape Other (See Comments)    Arms black and blue, tears skin.  Please use "paper" tape    Past Medical History:  Diagnosis Date  . Allergy   . Anemia 2012, 08/2015   chronic. Acute due to large hematoma 2013. 2017: due to gastric AVM bleeding.   . Anxiety   . Atrial fibrillation ([place]) 2012  . CAD (coronary artery disease) 2012  . COPD (chronic obstructive pulmonary disease) (  [place]) 2013   exertional dyspnea  . Gastric angiodysplasia with hemorrhage 08/2015  . Hx of colonic polyps 2001, 2011   adenomatous 2001. HP 2001, 2011.  Marland Kitchen Hyperlipidemia   . Myocardial infarction 06/2010.  12/2011.    "twice; 1 day apart; during/after cath". NQMI in setting severe anemia 2013.   Marland Kitchen Neuropathy ([place]) 2014   in feet, likely due to spinal stenosis, DDD, HNP  . Osteoarthritis 2002   spinal stenosis, spondylolisthesis, disc protrusion multilevel in lumbar spine  . Stroke ([place])   . Upper GI bleed 08/2015   EGD: 2 small gastric AVMs, 1 actively bleeding.  Both treated with APC ablation, clipping of the bleeder    Past Surgical History:  Procedure Laterality Date  .  APPENDECTOMY    . CARDIAC CATHETERIZATION  2012   50% LAD and luminal irreg in RCA, 1/12  Post cath MI from damage  . CARPAL TUNNEL RELEASE     left  . CARPAL TUNNEL RELEASE  10/11/11   Dr Rush Barer  . CATARACT EXTRACTION W/ INTRAOCULAR LENS IMPLANT  2/13   Dr [name]  . COLONOSCOPY  2001, 02/2010   Dr Deatra Ina.   . ESOPHAGOGASTRODUODENOSCOPY N/A 08/15/2015   [name] MD; 2 small gastric AVMs, 1 actively bleeding.  Both treated with APC ablation, clipping of the bleeder  . FOOT FRACTURE SURGERY     right heel repair  . FRACTURE SURGERY    . KNEE ARTHROSCOPY     left, right knee 10/2009  . MASTOIDECTOMY     x3 on right  . NASAL SEPTUM SURGERY     nasal septal repair  . PENILE PROSTHESIS  REMOVAL    . PENILE PROSTHESIS IMPLANT     x 2--got infection after 2nd and had to remove  . PILONIDAL CYST / SINUS EXCISION    . SHOULDER ARTHROSCOPY     right  . TONSILLECTOMY AND ADENOIDECTOMY      Family History  Problem Relation Age of Onset  . Diabetes Neg Hx   . Hypertension Neg Hx   . Heart attack Father   . Cancer Mother     ? leukemia    Social History   Social History  . Marital status: Married    Spouse name: N/A  . Number of children: 4  . Years of education: N/A   Occupational History  . Retired Chief Strategy Officer the job trainin)    Social History Main Topics  . Smoking status: Former Smoker    Packs/day: 2.50    Years: 30.00    Types: Cigarettes    Quit date: 06/11/1978  . Smokeless tobacco: Never Used  . Alcohol use No     Comment: QUIT [id number] alcoholic  . Drug use: No  . Sexual activity: Not Currently   Other Topics Concern  . Not on file   Social History Narrative   Grew up in [place]   Married---2nd   2 children ([place], [place]  and near [place])--2 stepchildren   Former Smoker--quit in 1980   Alcohol use-no. Quit in 1980--alcoholic      Has living will.   Has designated wife, then step daughter [name] have health care  POA.   Would accept resuscitation attempts.   No feeding tube if cognitively unaware.   Review of Systems No fever Doesn't feel sick No history of gout    Objective:   Physical Exam  Musculoskeletal:  Mild swelling, redness, warmth and tenderness of right olecranon bursa No  skin breaks Doesn't seem infected          Assessment & Plan:

## 2016-04-03 NOTE — Assessment & Plan Note (Signed)
Looks more inflamed and not infected No history of gout--will hold off on aspiration but check uric acid (reassuring if low) Try ice and compression If worsens, might try empiric antibiotic

## 2016-04-03 NOTE — Addendum Note (Signed)
Addended by: Pilar Grammes on: 04/03/2016 03:10 PM   Modules accepted: Orders

## 2016-04-09 ENCOUNTER — Other Ambulatory Visit: Payer: Self-pay | Admitting: *Deleted

## 2016-04-09 MED ORDER — VERAPAMIL HCL ER 240 MG PO TBCR: 240.0000 mg | EXTENDED_RELEASE_TABLET | Freq: Every day | ORAL | 1 refills | Status: DC

## 2016-04-16 ENCOUNTER — Ambulatory Visit (INDEPENDENT_AMBULATORY_CARE_PROVIDER_SITE_OTHER): Payer: PPO | Admitting: Emergency Medicine

## 2016-04-16 ENCOUNTER — Encounter: Payer: Self-pay | Admitting: Emergency Medicine

## 2016-04-16 DIAGNOSIS — J438 Other emphysema: Secondary | ICD-10-CM

## 2016-04-16 DIAGNOSIS — J301 Allergic rhinitis due to pollen: Secondary | ICD-10-CM

## 2016-04-16 MED ORDER — IPRATROPIUM BROMIDE 0.03 % NA SOLN: 2.0000 | Freq: Every day | NASAL | 5 refills | Status: DC

## 2016-04-16 NOTE — Patient Instructions (Signed)
Stop astelin nasal spray for now.  Try starting ipratropium nasal spray, 2 sprays each nostril 1 hour before bedtime. Continue your Stiolto  Take albuterol 2 puffs up to every 4 hours if needed for shortness of breath.  Follow with Dr Lamonte Sakai in 6 months or sooner if you have any problems

## 2016-04-16 NOTE — Assessment & Plan Note (Signed)
Appears to be doing well. We will continue Stiolto as ordered. Albuterol as needed. Flu shot up-to-date.

## 2016-04-16 NOTE — Addendum Note (Signed)
Addended by: Desmond Dike C on: 04/16/2016 02:30 PM   Modules accepted: Orders

## 2016-04-16 NOTE — Assessment & Plan Note (Signed)
Continues to have severe drainage particularly at night. It causes him to clear his throat and cough in the evenings when supine. I will temporarily stop Astelin nasal spray. Try starting ipratropium nasal spray one hour before bedtime.

## 2016-04-16 NOTE — Progress Notes (Signed)
Subjective:    Patient ID: Eduardo [name], male    DOB: January 28, 1931, 82 y.o.   MRN: [id number] HPI 82 yo man, former smoker, CAD s/p MI 1/12, A Fib, allergies, exertional SOB for a few yrs. This has been worse since recent hosp in 1/12 - was cathed x 2, no intervention made, course c/b renal failure. 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 No hypoxemia reported with exercise. Was sent from the hospital on metoprolol (new). He has heard wheezing, especially when nasal allergies. Arlyce Harman at Dr Alla German office suggested AFL/COPD.    ROV 08/27/14 -- follow up visit for COPD and allergic rhinitis, cough. He tells me that he has continued to have a lot of mucous - uses NSW's regularly. He does build up mucous overnight but it doesn't wake him. He is congested in the am. He is on zyrtec, no longer on singulair.  We discussed pursed lip breathing and incentive spirometry today. He is on Breo and tolerates well. His activity level is pretty good but spinal stenosis does slow him down. He works with a Clinical research associate 2x a week. No exacerbations since last time.   ROV 10/25/14 -- follow-up visit for COPD (FEV1 1.03 L or 50% predicted, 09/2010), cough and allergic rhinitis. He reports today progressive dyspnea for last 3 months, happens when sleeping and sometimes when supine. His wife has also noted some exertional SOB, has to stop after 200 feet. He is on Breo, but we stopped spiriva. With these new sx he added back spiriva 4 days ago. He is still able to do his daily workouts, trainer 2x a week.  No wheeze or cough. His wife is interested in looking into getting a scooter for him.   ROV 03/11/15 -- follow up visit for severe COPD, rhinitis and chronic cough. 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 His cognitive abilities are improved to baseline. Breathing has been good since last visit.  He usually has trouble with Fall allergies. He is on  Zyrtec, astelin prn.  He is stable on breo and spiriva. He is able to walk indefinitely. Sometimes coughs. No wheezing.   ROV 09/09/15 -- patient with a history of significant COPD, allergic rhinitis, chronic cough. Been following him for the last 5-6 years. He was last seen in our office by Dr Melvyn Novas in December 2016 at which time he was treated for an acute exacerbation and also given a trial of Stiolto. He was subsequently continued on Spiriva until it ran out. He then switched to [facility] and he feels that he is doing well on it. 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 He is now back on his anti-coagulation. He is doing well, breathing is not limiting. He has astelin NS that he can use prn. Is on zyrtec.   ROV 04/16/16 -- patient has a history of COPD, allergic rhinitis, chronic cough. He is currently managed on Stiolto. He feels that he has been doing fairly well. He has been less active due to back discomfort and increased dyspnea. He does exercise twice a week. No acute flares since last time. He has a lot of nasal gtt at night, a lot of throat clearing and cough at night.    Review of Systems As per HPI    Objective:  Physical Exam Vitals:   04/16/16 1410  BP: 132/62  BP Location: Left  Arm  Cuff Size: Normal  Pulse: 66  SpO2: 96%  Weight: 168 lb (76.2 kg)  Height: 5\' 3"  (1.6 m)   Gen: Pleasant, well-nourished, in no distress,  normal affect  ENT: No lesions,  mouth clear,  oropharynx clear, no postnasal drip  Neck: No JVD, insp and exp stridor  Lungs: No use of accessory muscles, referred UA noises.   Cardiovascular: irregular, heart sounds normal, no murmur or gallops, no peripheral edema  Musculoskeletal: No deformities, no cyanosis or clubbing  Neuro: alert, non focal  Skin: Warm, no lesions or rashes   Assessment & Plan:  COPD (chronic obstructive pulmonary disease) with emphysema (HCC) Appears to be doing well. We will continue  Stiolto as ordered. Albuterol as needed. Flu shot up-to-date.  Allergic rhinitis Continues to have severe drainage particularly at night. It causes him to clear his throat and cough in the evenings when supine. I will temporarily stop Astelin nasal spray. Try starting ipratropium nasal spray one hour before bedtime.   Baltazar Apo, MD, PhD 04/16/2016, 2:25 PM [facility] [phone] or if no answer [phone]

## 2016-05-10 ENCOUNTER — Other Ambulatory Visit: Payer: Self-pay | Admitting: Internal Medicine

## 2016-05-10 DIAGNOSIS — I4891 Unspecified atrial fibrillation: Secondary | ICD-10-CM

## 2016-05-14 ENCOUNTER — Telehealth: Payer: Self-pay | Admitting: Emergency Medicine

## 2016-05-14 NOTE — Telephone Encounter (Signed)
lmtcb x1 for pt. 

## 2016-05-15 NOTE — Telephone Encounter (Signed)
Patient returned call, CB is [phone]

## 2016-05-15 NOTE — Telephone Encounter (Signed)
lmtcb x1 for pt. 

## 2016-05-15 NOTE — Telephone Encounter (Signed)
lmtcb x2 for pt. 

## 2016-05-16 NOTE — Telephone Encounter (Signed)
lmtcb x2 for pt. 

## 2016-05-17 NOTE — Telephone Encounter (Signed)
Patient is returning call, CB is [phone].

## 2016-05-17 NOTE — Telephone Encounter (Signed)
LMTCB

## 2016-05-18 ENCOUNTER — Other Ambulatory Visit (INDEPENDENT_AMBULATORY_CARE_PROVIDER_SITE_OTHER): Payer: PPO

## 2016-05-18 DIAGNOSIS — I4891 Unspecified atrial fibrillation: Secondary | ICD-10-CM | POA: Diagnosis not present

## 2016-05-18 DIAGNOSIS — R7989 Other specified abnormal findings of blood chemistry: Secondary | ICD-10-CM | POA: Diagnosis not present

## 2016-05-18 LAB — CBC WITH DIFFERENTIAL/PLATELET
BASOS PCT: 0.6 % (ref 0.0–3.0)
Basophils Absolute: 0 10*3/uL (ref 0.0–0.1)
EOS PCT: 6.2 % — AB (ref 0.0–5.0)
Eosinophils Absolute: 0.4 10*3/uL (ref 0.0–0.7)
HCT: 35.5 % — ABNORMAL LOW (ref 39.0–52.0)
Hemoglobin: 11.7 g/dL — ABNORMAL LOW (ref 13.0–17.0)
LYMPHS ABS: 2 10*3/uL (ref 0.7–4.0)
Lymphocytes Relative: 29.7 % (ref 12.0–46.0)
MCHC: 33 g/dL (ref 30.0–36.0)
MCV: 87.5 fl (ref 78.0–100.0)
MONOS PCT: 7.5 % (ref 3.0–12.0)
Monocytes Absolute: 0.5 10*3/uL (ref 0.1–1.0)
NEUTROS ABS: 3.7 10*3/uL (ref 1.4–7.7)
NEUTROS PCT: 56 % (ref 43.0–77.0)
PLATELETS: 204 10*3/uL (ref 150.0–400.0)
RBC: 4.06 Mil/uL — AB (ref 4.22–5.81)
RDW: 16.2 % — AB (ref 11.5–15.5)
WBC: 6.6 10*3/uL (ref 4.0–10.5)

## 2016-05-18 LAB — COMPREHENSIVE METABOLIC PANEL
ALK PHOS: 53 U/L (ref 39–117)
ALT: 16 U/L (ref 0–53)
AST: 24 U/L (ref 0–37)
Albumin: 4.3 g/dL (ref 3.5–5.2)
BUN: 30 mg/dL — ABNORMAL HIGH (ref 6–23)
CO2: 29 mEq/L (ref 19–32)
Calcium: 10 mg/dL (ref 8.4–10.5)
Chloride: 106 mEq/L (ref 96–112)
Creatinine, Ser: 1.11 mg/dL (ref 0.40–1.50)
GFR: 67.23 mL/min (ref >60.00)
GLUCOSE: 72 mg/dL (ref 70–99)
POTASSIUM: 4.2 meq/L (ref 3.5–5.1)
Sodium: 142 mEq/L (ref 135–145)
Total Bilirubin: 0.5 mg/dL (ref 0.2–1.2)
Total Protein: 7.2 g/dL (ref 6.0–8.3)

## 2016-05-18 LAB — LIPID PANEL
CHOLESTEROL: 134 mg/dL (ref 0–200)
HDL: 42.8 mg/dL (ref >39.00)
Total CHOL/HDL Ratio: 3
Triglycerides: 952 mg/dL — ABNORMAL HIGH (ref 0.0–149.0)

## 2016-05-18 LAB — T4, FREE: Free T4: 0.77 ng/dL (ref 0.60–1.60)

## 2016-05-18 LAB — LDL CHOLESTEROL, DIRECT: Direct LDL: 65 mg/dL

## 2016-05-18 NOTE — Telephone Encounter (Signed)
RB  Pt called and wanted to let you know that the new nasal spray is working great for him  Instructions   Stop astelin nasal spray for now.  Try starting ipratropium nasal spray, 2 sprays each nostril 1 hour before bedtime. Continue your Stiolto  Take albuterol 2 puffs up to every 4 hours if needed for shortness of breath.  Follow with Dr Lamonte Sakai in 6 months or sooner if you have any problems

## 2016-05-18 NOTE — Telephone Encounter (Signed)
lmomtcb x 2  

## 2016-05-21 NOTE — Telephone Encounter (Signed)
Pt aware of below message. Pt state he does not need refills at this time, but will contact when needed. Nothing further needed.

## 2016-05-21 NOTE — Telephone Encounter (Signed)
Thank you. We will probably continue this indefinitely. Please give him refills if he needs them.

## 2016-05-22 ENCOUNTER — Encounter: Payer: PPO | Admitting: Internal Medicine

## 2016-05-25 ENCOUNTER — Encounter: Payer: PPO | Admitting: Internal Medicine

## 2016-05-29 ENCOUNTER — Encounter: Payer: Self-pay | Admitting: Internal Medicine

## 2016-05-29 ENCOUNTER — Encounter: Payer: Self-pay | Admitting: Interventional Cardiology

## 2016-05-29 ENCOUNTER — Ambulatory Visit (INDEPENDENT_AMBULATORY_CARE_PROVIDER_SITE_OTHER): Payer: PPO | Admitting: Internal Medicine

## 2016-05-29 ENCOUNTER — Ambulatory Visit (INDEPENDENT_AMBULATORY_CARE_PROVIDER_SITE_OTHER): Payer: PPO | Admitting: Interventional Cardiology

## 2016-05-29 VITALS — BP 138/72 | HR 65 | Ht 63.0 in | Wt 154.0 lb

## 2016-05-29 VITALS — BP 132/80 | HR 71 | Temp 97.8°F | Ht 62.0 in | Wt 155.0 lb

## 2016-05-29 DIAGNOSIS — I482 Chronic atrial fibrillation, unspecified: Secondary | ICD-10-CM

## 2016-05-29 DIAGNOSIS — J438 Other emphysema: Secondary | ICD-10-CM | POA: Diagnosis not present

## 2016-05-29 DIAGNOSIS — I480 Paroxysmal atrial fibrillation: Secondary | ICD-10-CM | POA: Diagnosis not present

## 2016-05-29 DIAGNOSIS — I5032 Chronic diastolic (congestive) heart failure: Secondary | ICD-10-CM | POA: Diagnosis not present

## 2016-05-29 DIAGNOSIS — I119 Hypertensive heart disease without heart failure: Secondary | ICD-10-CM

## 2016-05-29 DIAGNOSIS — E784 Other hyperlipidemia: Secondary | ICD-10-CM | POA: Diagnosis not present

## 2016-05-29 DIAGNOSIS — Z0000 Encounter for general adult medical examination without abnormal findings: Secondary | ICD-10-CM | POA: Diagnosis not present

## 2016-05-29 DIAGNOSIS — K31811 Angiodysplasia of stomach and duodenum with bleeding: Secondary | ICD-10-CM

## 2016-05-29 DIAGNOSIS — Z7901 Long term (current) use of anticoagulants: Secondary | ICD-10-CM | POA: Diagnosis not present

## 2016-05-29 DIAGNOSIS — I69351 Hemiplegia and hemiparesis following cerebral infarction affecting right dominant side: Secondary | ICD-10-CM | POA: Diagnosis not present

## 2016-05-29 DIAGNOSIS — I2510 Atherosclerotic heart disease of native coronary artery without angina pectoris: Secondary | ICD-10-CM

## 2016-05-29 DIAGNOSIS — F39 Unspecified mood [affective] disorder: Secondary | ICD-10-CM

## 2016-05-29 DIAGNOSIS — I10 Essential (primary) hypertension: Secondary | ICD-10-CM

## 2016-05-29 DIAGNOSIS — E7849 Other hyperlipidemia: Secondary | ICD-10-CM

## 2016-05-29 NOTE — Assessment & Plan Note (Signed)
Will continue PPI

## 2016-05-29 NOTE — Assessment & Plan Note (Signed)
Paroxysmal On eliquis

## 2016-05-29 NOTE — Progress Notes (Signed)
Cardiology Office Note    Date:  05/29/2016   ID:  HUBBERT [name], DOB 1930-11-03, MRN [id number]  PCP:  [name], MD  Cardiologist: Sinclair Grooms, MD   Chief Complaint  Patient presents with  . Atrial Fibrillation    History of Present Illness:  Eduardo [name] is a 82 y.o. male follow-up ofhx of chronic atrial fibrillation, prior warfarin therapy (discontinued after a fall resulting in large hematoma), CVA in 7/16 (Eliquis started at that time) 6, CAD with LHC in 8/67 complicated by dissection of ostial RCA (treated conservatively), diastolic CHF, HTN, HL, prior GI bleed.     He is doing well. He has not bled on anticoagulation. He denies chest pain. He does have exertional dyspnea. His wife states that he is very sedentary. He won't go out to walk. They do go to the gym twice a week.   Past Medical History:  Diagnosis Date  . Allergy   . Anemia 2012, 08/2015   chronic. Acute due to large hematoma 2013. 2017: due to gastric AVM bleeding.   . Anxiety   . Atrial fibrillation ([place]) 2012  . CAD (coronary artery disease) 2012  . COPD (chronic obstructive pulmonary disease) ([place]) 2013   exertional dyspnea  . Gastric angiodysplasia with hemorrhage 08/2015  . Hx of colonic polyps 2001, 2011   adenomatous 2001. HP 2001, 2011.  Marland Kitchen Hyperlipidemia   . Myocardial infarction 06/2010.  12/2011.    "twice; 1 day apart; during/after cath". NQMI in setting severe anemia 2013.   Marland Kitchen Neuropathy ([place]) 2014   in feet, likely due to spinal stenosis, DDD, HNP  . Osteoarthritis 2002   spinal stenosis, spondylolisthesis, disc protrusion multilevel in lumbar spine  . Stroke ([place])   . Upper GI bleed 08/2015   EGD: 2 small gastric AVMs, 1 actively bleeding.  Both treated with APC ablation, clipping of the bleeder    Past Surgical History:  Procedure Laterality Date  . APPENDECTOMY    . CARDIAC CATHETERIZATION  2012   50% LAD and luminal irreg in RCA, 1/12  Post cath MI from damage    . CARPAL TUNNEL RELEASE     left  . CARPAL TUNNEL RELEASE  10/11/11   Dr Rush Barer  . CATARACT EXTRACTION W/ INTRAOCULAR LENS IMPLANT  2/13   Dr [name]  . COLONOSCOPY  2001, 02/2010   Dr Deatra Ina.   . ESOPHAGOGASTRODUODENOSCOPY N/A 08/15/2015   [name] MD; 2 small gastric AVMs, 1 actively bleeding.  Both treated with APC ablation, clipping of the bleeder  . FOOT FRACTURE SURGERY     right heel repair  . FRACTURE SURGERY    . KNEE ARTHROSCOPY     left, right knee 10/2009  . MASTOIDECTOMY     x3 on right  . NASAL SEPTUM SURGERY     nasal septal repair  . PENILE PROSTHESIS  REMOVAL    . PENILE PROSTHESIS IMPLANT     x 2--got infection after 2nd and had to remove  . PILONIDAL CYST / SINUS EXCISION    . SHOULDER ARTHROSCOPY     right  . TONSILLECTOMY AND ADENOIDECTOMY      Current Medications: Outpatient Medications Prior to Visit  Medication Sig Dispense Refill  . albuterol (PROAIR HFA) 108 (90 BASE) MCG/ACT inhaler Inhale 2 puffs into the lungs every 6 (six) hours as needed for wheezing or shortness of breath. 3 Inhaler 3  . apixaban (ELIQUIS) 5 MG TABS tablet Take 5  mg by mouth 2 (two) times daily.    Marland Kitchen atorvastatin (LIPITOR) 20 MG tablet TAKE 1 TABLET BY MOUTH DAILY 90 tablet 3  . B Complex-C (SUPER B COMPLEX PO) Take 1 tablet by mouth at bedtime.     . cetirizine (ZYRTEC) 10 MG tablet Take 10 mg by mouth at bedtime.     Marland Kitchen escitalopram (LEXAPRO) 20 MG tablet Take 20 mg by mouth at bedtime.     . lamoTRIgine (LAMICTAL) 150 MG tablet Take 150 mg by mouth at bedtime. Reported on 08/14/2015    . Multiple Vitamin (MULTIVITAMIN) tablet Take 1 tablet by mouth at bedtime.     . pantoprazole (PROTONIX) 40 MG tablet Take 1 tablet (40 mg total) by mouth daily before breakfast. 90 tablet 3  . triamcinolone cream (KENALOG) 0.1 % Apply 1 application topically 2 (two) times daily.     . verapamil (CALAN-SR) 240 MG CR tablet Take 1 tablet (240 mg total) by mouth at bedtime. 90  tablet 1  . Azelastine HCl 0.15 % SOLN Place 1 spray into both nostrils 2 (two) times daily.    . fenofibrate 160 MG tablet Take 1 tablet (160 mg total) by mouth daily. (Patient not taking: Reported on 05/29/2016) 90 tablet 3  . ipratropium (ATROVENT) 0.03 % nasal spray Place 2 sprays into both nostrils at bedtime. (Patient not taking: Reported on 05/29/2016) 30 mL 5  . spironolactone (ALDACTONE) 25 MG tablet One tablet by mouth daily for one week and then decrease to one tablet on Mon, Wed, Fri (Patient not taking: Reported on 05/29/2016) 20 tablet 6  . Tiotropium Bromide-Olodaterol (STIOLTO RESPIMAT) 2.5-2.5 MCG/ACT AERS Inhale 2 puffs into the lungs daily. (Patient not taking: Reported on 05/29/2016) 3 Inhaler 3   No facility-administered medications prior to visit.      Allergies:   Penicillins; Sulfonamide derivatives; and Tape   Social History   Social History  . Marital status: Married    Spouse name: N/A  . Number of children: 4  . Years of education: N/A   Occupational History  . Retired Chief Strategy Officer the job trainin)    Social History Main Topics  . Smoking status: Former Smoker    Packs/day: 2.50    Years: 30.00    Types: Cigarettes    Quit date: 06/11/1978  . Smokeless tobacco: Never Used  . Alcohol use No     Comment: QUIT [id number] alcoholic  . Drug use: No  . Sexual activity: Not Currently   Other Topics Concern  . None   Social History Narrative   Grew up in [place]   Married---2nd   2 children ([place], [place]  and near [place])--2 stepchildren   Former Smoker--quit in 1980   Alcohol use-no. Quit in 1980--alcoholic      Has living will.   Has designated wife, then step daughter [name] have health care POA.   Would accept resuscitation attempts.   No feeding tube if cognitively unaware.     Family History:  The patient's family history includes Cancer in his mother; Heart attack in his father.   ROS:   Please see the history of present  illness.    No blood in the urine or stool. Back pain difficulty with balance easy bruising decreased hearing shortness of breath on exertion.  All other systems reviewed and are negative.   PHYSICAL EXAM:   VS:  BP 138/72 (BP Location: Left Arm)   Pulse 65   Ht 5\' 3"  (1.6  m)   Wt 154 lb (69.9 kg)   BMI 27.28 kg/m    GEN: Well nourished, well developed, in no acute distress  HEENT: normal  Neck: no JVD, carotid bruits, or masses Cardiac: IIRR; no murmurs, rubs, or gallops,no edema  Respiratory:  clear to auscultation bilaterally, normal work of breathing GI: soft, nontender, nondistended, + BS MS: no deformity or atrophy  Skin: warm and dry, no rash Neuro:  Alert and Oriented x 3, Strength and sensation are intact Psych: euthymic mood, full affect  Wt Readings from Last 3 Encounters:  05/29/16 155 lb (70.3 kg)  05/29/16 154 lb (69.9 kg)  04/16/16 168 lb (76.2 kg)      Studies/Labs Reviewed:   EKG:  EKG  Not performed  Recent Labs: 05/18/2016: ALT 16; BUN 30; Creatinine, Ser 1.11; Hemoglobin 11.7; Platelets 204.0; Potassium 4.2; Sodium 142   Lipid Panel    Component Value Date/Time   CHOL 134 05/18/2016 0810   TRIG (H) 05/18/2016 0810    952.0 Triglyceride is over 400; calculations on Lipids are invalid.   HDL 42.80 05/18/2016 0810   CHOLHDL 3 05/18/2016 0810   VLDL UNABLE TO CALCULATE IF TRIGLYCERIDE OVER 400 mg/dL 12/31/2014 0510   LDLCALC UNABLE TO CALCULATE IF TRIGLYCERIDE OVER 400 mg/dL 12/31/2014 0510   LDLDIRECT 65.0 05/18/2016 0810    Additional studies/ records that were reviewed today include:  No new data    ASSESSMENT:    1. Atherosclerosis of native coronary artery of native heart without angina pectoris   2. Chronic diastolic HF (heart failure) ([place])   3. Essential hypertension   4. 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 Chronic atrial fibrillation (HCC)   6. Other emphysema ([place])   7. Other hyperlipidemia   8. Chronic  anticoagulation      PLAN:  In order of problems listed above:  1. Clinically stable without 2. No evidence of volume overload 3. Blood pressures under adequate control 4. Addressed above 5. Continue current anticoagulation regimen 6. Problem not a dressed. 7. Followed by primary care 8. This point no bleeding, occasions on the current medical regimen.   Medication Adjustments/Labs and Tests Ordered: Current medicines are reviewed at length with the patient today.  Concerns regarding medicines are outlined above.  Medication changes, Labs and Tests ordered today are listed in the Patient Instructions below. Patient Instructions  Medication Instructions:  None  Labwork: None  Testing/Procedures: None  Follow-Up: Your physician wants you to follow-up in: 9-12 months with Dr. Tamala Julian.  You will receive a reminder letter in the mail two months in advance. If you don't receive a letter, please call our office to schedule the follow-up appointment.   Any Other Special Instructions Will Be Listed Below (If Applicable).     If you need a refill on your cardiac medications before your next appointment, please call your pharmacy.      Signed, Sinclair Grooms, MD  05/29/2016 1:43 PM    [facility] [place], [place], [place]  [address] Phone: [phone]; Fax: [phone]

## 2016-05-29 NOTE — Assessment & Plan Note (Signed)
Chronic depression controlled with meds---Dr Nicolasa Ducking

## 2016-05-29 NOTE — Assessment & Plan Note (Signed)
Neutral fluid status Better with improvement of anemia

## 2016-05-29 NOTE — Assessment & Plan Note (Signed)
Very mild in hand Continues to work with therapist

## 2016-05-29 NOTE — Progress Notes (Signed)
Subjective:    Patient ID: Eduardo [name], male    DOB: 08-Jul-1930, 82 y.o.   MRN: [id number]  HPI Here with wife for Medicare wellness and follow up of chronic health conditions Reviewed form and advanced directives Reviewed multiple other doctors-- see his other form No alcohol or tobacco 1 fall this year--down step in garage-- mild soreness in shoulder Chronic mood issues Vision is fine--reading glasses Hearing aides yesterday--likes them Independent with instrumental ADLs No sig memory issues  No evidence of further GI bleeding Asked about the protonix--I recommended continuing this No abdominal pain  Still has vague weakness in right hand Going to PT still twice a week Good function--- handwriting still not great and hard to loosen things with it  Still sees Dr Nicolasa Ducking for depression Mood has been good No anhedonia  No chest pain No SOB--but has stable DOE while doing upper body work or fast walking No dizziness or syncope Still has balance issues No palpitations  Current Outpatient Prescriptions on File Prior to Visit  Medication Sig Dispense Refill  . albuterol (PROAIR HFA) 108 (90 BASE) MCG/ACT inhaler Inhale 2 puffs into the lungs every 6 (six) hours as needed for wheezing or shortness of breath. 3 Inhaler 3  . apixaban (ELIQUIS) 5 MG TABS tablet Take 5 mg by mouth 2 (two) times daily.    Marland Kitchen atorvastatin (LIPITOR) 20 MG tablet TAKE 1 TABLET BY MOUTH DAILY 90 tablet 3  . B Complex-C (SUPER B COMPLEX PO) Take 1 tablet by mouth at bedtime.     . cetirizine (ZYRTEC) 10 MG tablet Take 10 mg by mouth at bedtime.     Marland Kitchen escitalopram (LEXAPRO) 20 MG tablet Take 20 mg by mouth at bedtime.     . lamoTRIgine (LAMICTAL) 150 MG tablet Take 150 mg by mouth at bedtime. Reported on 08/14/2015    . Multiple Vitamin (MULTIVITAMIN) tablet Take 1 tablet by mouth at bedtime.     . pantoprazole (PROTONIX) 40 MG tablet Take 1 tablet (40 mg total) by mouth daily before breakfast. 90  tablet 3  . triamcinolone cream (KENALOG) 0.1 % Apply 1 application topically 2 (two) times daily.     . verapamil (CALAN-SR) 240 MG CR tablet Take 1 tablet (240 mg total) by mouth at bedtime. 90 tablet 1   No current facility-administered medications on file prior to visit.     Allergies  Allergen Reactions  . Penicillins Itching    Has patient had a PCN reaction causing immediate rash, facial/tongue/throat swelling, SOB or lightheadedness with hypotension: Yes Has patient had a PCN reaction causing severe rash involving mucus membranes or skin necrosis: No Has patient had a PCN reaction that required hospitalization No Has patient had a PCN reaction occurring within the last 10 years: No If all of the above answers are "NO", then may proceed with Cephalosporin use.   . Sulfonamide Derivatives Other (See Comments)    "it affected my kidneys"  . Tape Other (See Comments)    Arms black and blue, tears skin.  Please use "paper" tape    Past Medical History:  Diagnosis Date  . Allergy   . Anemia 2012, 08/2015   chronic. Acute due to large hematoma 2013. 2017: due to gastric AVM bleeding.   . Anxiety   . Atrial fibrillation ([place]) 2012  . CAD (coronary artery disease) 2012  . COPD (chronic obstructive pulmonary disease) ([place]) 2013   exertional dyspnea  . Gastric angiodysplasia with hemorrhage 08/2015  .  Hx of colonic polyps 2001, 2011   adenomatous 2001. HP 2001, 2011.  Marland Kitchen Hyperlipidemia   . Myocardial infarction 06/2010.  12/2011.    "twice; 1 day apart; during/after cath". NQMI in setting severe anemia 2013.   Marland Kitchen Neuropathy ([place]) 2014   in feet, likely due to spinal stenosis, DDD, HNP  . Osteoarthritis 2002   spinal stenosis, spondylolisthesis, disc protrusion multilevel in lumbar spine  . Stroke ([place])   . Upper GI bleed 08/2015   EGD: 2 small gastric AVMs, 1 actively bleeding.  Both treated with APC ablation, clipping of the bleeder    Past Surgical History:  Procedure  Laterality Date  . APPENDECTOMY    . CARDIAC CATHETERIZATION  2012   50% LAD and luminal irreg in RCA, 1/12  Post cath MI from damage  . CARPAL TUNNEL RELEASE     left  . CARPAL TUNNEL RELEASE  10/11/11   Dr Rush Barer  . CATARACT EXTRACTION W/ INTRAOCULAR LENS IMPLANT  2/13   Dr [name]  . COLONOSCOPY  2001, 02/2010   Dr Deatra Ina.   . ESOPHAGOGASTRODUODENOSCOPY N/A 08/15/2015   [name] MD; 2 small gastric AVMs, 1 actively bleeding.  Both treated with APC ablation, clipping of the bleeder  . FOOT FRACTURE SURGERY     right heel repair  . FRACTURE SURGERY    . KNEE ARTHROSCOPY     left, right knee 10/2009  . MASTOIDECTOMY     x3 on right  . NASAL SEPTUM SURGERY     nasal septal repair  . PENILE PROSTHESIS  REMOVAL    . PENILE PROSTHESIS IMPLANT     x 2--got infection after 2nd and had to remove  . PILONIDAL CYST / SINUS EXCISION    . SHOULDER ARTHROSCOPY     right  . TONSILLECTOMY AND ADENOIDECTOMY      Family History  Problem Relation Age of Onset  . Cancer Mother     ? leukemia  . Heart attack Father   . Diabetes Neg Hx   . Hypertension Neg Hx     Social History   Social History  . Marital status: Married    Spouse name: N/A  . Number of children: 4  . Years of education: N/A   Occupational History  . Retired Chief Strategy Officer the job trainin)    Social History Main Topics  . Smoking status: Former Smoker    Packs/day: 2.50    Years: 30.00    Types: Cigarettes    Quit date: 06/11/1978  . Smokeless tobacco: Never Used  . Alcohol use No     Comment: QUIT [id number] alcoholic  . Drug use: No  . Sexual activity: Not Currently   Other Topics Concern  . Not on file   Social History Narrative   Grew up in [place]   Married---2nd   2 children ([place], [place]  and near [place])--2 stepchildren   Former Smoker--quit in 1980   Alcohol use-no. Quit in 1980--alcoholic      Has living will.   Has designated wife, then step daughter [name] have health care POA.   Would accept resuscitation attempts.   No feeding tube if cognitively unaware.   Review of Systems Appetite is good Weight stable Sleeps well Wears seat belt Teeth are okay--- upper partial. Keeps up with dentist Occasional back pain--relates to spinal stenosis.no radiation to legs Ongoing skin lesions-- uses cream and keeps up with dermatologist Bowels are fine. No blood in stool.  Objective:   Physical Exam  Constitutional: He is oriented to person, place, and time. He appears well-nourished. No distress.  HENT:  Mouth/Throat: Oropharynx is clear and moist. No oropharyngeal exudate.  Neck: Normal range of motion. Neck supple. No thyromegaly present.  Cardiovascular: Normal rate, regular rhythm, normal heart sounds and intact distal pulses.  Exam reveals no gallop.   No murmur heard. Rare skip  Pulmonary/Chest: Effort normal. No respiratory distress. He has no wheezes. He has no rales.  Decreased breath sounds but clear  Musculoskeletal: He exhibits no edema or tenderness.  Lymphadenopathy:    He has no cervical adenopathy.  Neurological: He is alert and oriented to person, place, and time.  President-- "Trump, Obama, ?" [id number] Recall 2/3  Mild decreased right grip strength  Skin: No rash noted. No erythema.  Psychiatric: He has a normal mood and affect. His behavior is normal.          Assessment & Plan:

## 2016-05-29 NOTE — Progress Notes (Signed)
Pre visit review using our clinic review tool, if applicable. No additional management support is needed unless otherwise documented below in the visit note. 

## 2016-05-29 NOTE — Assessment & Plan Note (Signed)
I have personally reviewed the Medicare Annual Wellness questionnaire and have noted 1. The patient's medical and social history 2. Their use of alcohol, tobacco or illicit drugs 3. Their current medications and supplements 4. The patient's functional ability including ADL's, fall risks, home safety risks and hearing or visual             impairment. 5. Diet and physical activities 6. Evidence for depression or mood disorders  The patients weight, height, BMI and visual acuity have been recorded in the chart I have made referrals, counseling and provided education to the patient based review of the above and I have provided the pt with a written personalized care plan for preventive services.  I have provided you with a copy of your personalized plan for preventive services. Please take the time to review along with your updated medication list.  No cancer screening due to age UTD on imms Discussed fitness

## 2016-05-29 NOTE — Patient Instructions (Signed)
Medication Instructions:  None  Labwork: None  Testing/Procedures: None  Follow-Up: Your physician wants you to follow-up in: 9-12 months with Dr. Smith.  You will receive a reminder letter in the mail two months in advance. If you don't receive a letter, please call our office to schedule the follow-up appointment.   Any Other Special Instructions Will Be Listed Below (If Applicable).     If you need a refill on your cardiac medications before your next appointment, please call your pharmacy.   

## 2016-06-13 ENCOUNTER — Other Ambulatory Visit: Payer: Self-pay | Admitting: Internal Medicine

## 2016-06-22 ENCOUNTER — Telehealth: Payer: Self-pay | Admitting: Interventional Cardiology

## 2016-06-22 NOTE — Telephone Encounter (Signed)
Received a call from [facility] PT.She stated patient comes to exercise at her gym.Stated today his pulse 48 B/P 138/63.Stated patient feels fine no complaints.Stated his pulse normally in mid 70's.She checked pulse manually.She wanted to ask Dr.Smith vital signs parameters.Advised Dr.Smith out of office will send message to him for advice.

## 2016-06-22 NOTE — Telephone Encounter (Signed)
New message  Pt c/o BP issue: STAT if pt c/o blurred vision, one-sided weakness or slurred speech  1. What are your last 5 BP readings? 2 days ago 160/69 yesterday 143/69 today 135/63 pulse 48   2. Are you having any other symptoms (ex. Dizziness, headache, blurred vision, passed out? no  3. What is your BP issue? Per PT wants to know with pt pulse can he go ahead with PT today.

## 2016-06-23 NOTE — Telephone Encounter (Signed)
He has atrial fib and peripheral pulse count will be inconsistent and not a reason to change therapy of alter program if patient is asymptomatic.

## 2016-06-25 NOTE — Telephone Encounter (Signed)
Returned call to patient no answer.LMTC. 

## 2016-06-29 NOTE — Telephone Encounter (Signed)
Returned call to patient.Left message on personal voice mail Dr.Smith's recommendations.Unable to call Eduardo [name] PT no phone number listed.

## 2016-06-30 ENCOUNTER — Other Ambulatory Visit: Payer: Self-pay | Admitting: Internal Medicine

## 2016-06-30 ENCOUNTER — Other Ambulatory Visit: Payer: Self-pay | Admitting: Cardiovascular Disease

## 2016-07-02 NOTE — Telephone Encounter (Signed)
Please review for refill. Thanks!  

## 2016-07-03 NOTE — Telephone Encounter (Signed)
.   fenofibrate 160 MG tablet Take 1 tablet (160 mg total) by mouth daily. (Patient not taking: Reported on 05/29/2016) 90 tablet

## 2016-07-03 NOTE — Telephone Encounter (Signed)
Please advise on refill request as per last office visit, patient reported not taking. Thanks, MI

## 2016-07-03 NOTE — Telephone Encounter (Signed)
If he does actually need it, that will need to come from PCP as they follow lipids.

## 2016-07-04 DIAGNOSIS — L853 Xerosis cutis: Secondary | ICD-10-CM | POA: Diagnosis not present

## 2016-07-04 DIAGNOSIS — L821 Other seborrheic keratosis: Secondary | ICD-10-CM | POA: Diagnosis not present

## 2016-07-04 DIAGNOSIS — L308 Other specified dermatitis: Secondary | ICD-10-CM | POA: Diagnosis not present

## 2016-07-04 DIAGNOSIS — L738 Other specified follicular disorders: Secondary | ICD-10-CM | POA: Diagnosis not present

## 2016-07-06 ENCOUNTER — Other Ambulatory Visit: Payer: Self-pay

## 2016-07-06 NOTE — Telephone Encounter (Signed)
Left a message on cell to find out how much fenofibrate he has. Will it last until after Dr [name] returns on Monday.

## 2016-07-06 NOTE — Telephone Encounter (Signed)
Pt left v/m; pt requested wrong med refill; pt does not need Lamictal; pt wants the fenofibrate refilled; see phone note from Dr Rockey Situ on 06/30/16; see 05/18/16 lab results and Dr Everardo Beals comments for labs; pt seen 05/29/16 annual. Pt request cb. Larene Beach said to send note to her; Dr [name] out of office.

## 2016-07-06 NOTE — Telephone Encounter (Signed)
Approved: can refill for a year 

## 2016-07-06 NOTE — Telephone Encounter (Signed)
Pt left v/m requesting refill lamictal.do not see where Dr [name] has prescribed. Last annual exam on 05/29/16. Pt thinks cardiologist transferred refills to be done by PCP.Please advise. Pt request cb when refilled. [facility] [place].

## 2016-07-09 MED ORDER — FENOFIBRATE 160 MG PO TABS: 160.0000 mg | ORAL_TABLET | Freq: Every day | ORAL | 3 refills | Status: DC

## 2016-07-09 NOTE — Telephone Encounter (Signed)
Left message on vm advising him we will follow up at next cpe.

## 2016-07-09 NOTE — Telephone Encounter (Signed)
Approved: okay x 1 year Let him know that there is a lot of variability in the triglycerides---so I would not change anything due to the increase. When he comes next time for his yearly, he should make sure he has an overnight fast

## 2016-07-09 NOTE — Telephone Encounter (Signed)
Spoke to pt. He said he took his last one yesterday. I told him I wanted to get with Dr [name] because his Triglycerides went up over 150 points in a year from 791 to 952. I was not sure if any changes needed to be made. He said they are usually always high, but not this high.

## 2016-07-12 ENCOUNTER — Ambulatory Visit (INDEPENDENT_AMBULATORY_CARE_PROVIDER_SITE_OTHER): Payer: PPO | Admitting: Adult Health

## 2016-07-12 ENCOUNTER — Encounter: Payer: Self-pay | Admitting: Adult Health

## 2016-07-12 DIAGNOSIS — J439 Emphysema, unspecified: Secondary | ICD-10-CM | POA: Diagnosis not present

## 2016-07-12 MED ORDER — PREDNISONE 10 MG PO TABS: ORAL_TABLET | ORAL | 0 refills | Status: DC

## 2016-07-12 MED ORDER — DOXYCYCLINE HYCLATE 100 MG PO TABS: 100.0000 mg | ORAL_TABLET | Freq: Two times a day (BID) | ORAL | 0 refills | Status: DC

## 2016-07-12 NOTE — Assessment & Plan Note (Signed)
Exacerbation  xopenex neb x 1   Plan  Patient Instructions  Extend Doxycycline for additional 4 days .  Mucinex DM Twice daily As needed  Cough/congestion  Prednisone taper over next week.  Saline nasal rinses As needed   Follow up with Dr. Lamonte Sakai  3 months and As needed   Please contact office for sooner follow up if symptoms do not improve or worsen or seek emergency care

## 2016-07-12 NOTE — Patient Instructions (Signed)
Extend Doxycycline for additional 4 days .  Mucinex DM Twice daily As needed  Cough/congestion  Prednisone taper over next week.  Saline nasal rinses As needed   Follow up with Dr. Lamonte Sakai  3 months and As needed   Please contact office for sooner follow up if symptoms do not improve or worsen or seek emergency care

## 2016-07-12 NOTE — Progress Notes (Signed)
@Patient  ID: Eduardo [name], male    DOB: 07/21/1930, 83 y.o.   MRN: [id number]  Chief Complaint  Patient presents with  . Acute Visit    COPD /coug h    Referring provider: Venia Carbon, MD  HPI: 83 yo man, former smoker, CAD s/p MI 1/12, A Fib, followed for COPD , AR and Chronic cough   07/12/2016 Acute OV  Pt presents for an acute office visit. Complains for last 2-3 days of increased cough, congestion with thick yellow mucus. And wheezing . No fever or body aches.  Good appetite w/ no nv/d. Taking mucinex with min help.   He remains on Stiolto for COPD   He is currently on Doxycycline from Dermatology , on 8/10 days.  Allergies  Allergen Reactions  . Penicillins Itching    Has patient had a PCN reaction causing immediate rash, facial/tongue/throat swelling, SOB or lightheadedness with hypotension: Yes Has patient had a PCN reaction causing severe rash involving mucus membranes or skin necrosis: No Has patient had a PCN reaction that required hospitalization No Has patient had a PCN reaction occurring within the last 10 years: No If all of the above answers are "NO", then may proceed with Cephalosporin use.   . Sulfonamide Derivatives Other (See Comments)    "it affected my kidneys"  . Tape Other (See Comments)    Arms black and blue, tears skin.  Please use "paper" tape    Immunization History  Administered Date(s) Administered  . Influenza Split 03/12/2011, 05/12/2012  . Influenza Whole 05/31/2009  . Influenza,inj,Quad PF,36+ Mos 03/05/2013, 02/17/2014, 03/11/2015, 04/03/2016  . Pneumococcal Conjugate-13 04/19/2014  . Pneumococcal Polysaccharide-23 06/11/2005  . Td 12/27/2010  . Zoster 09/09/2006    Past Medical History:  Diagnosis Date  . Allergy   . Anemia 2012, 08/2015   chronic. Acute due to large hematoma 2013. 2017: due to gastric AVM bleeding.   . Anxiety   . Atrial fibrillation ([place]) 2012  . CAD (coronary artery disease) 2012  . COPD  (chronic obstructive pulmonary disease) ([place]) 2013   exertional dyspnea  . Gastric angiodysplasia with hemorrhage 08/2015  . Hx of colonic polyps 2001, 2011   adenomatous 2001. HP 2001, 2011.  Marland Kitchen Hyperlipidemia   . Myocardial infarction 06/2010.  12/2011.    "twice; 1 day apart; during/after cath". NQMI in setting severe anemia 2013.   Marland Kitchen Neuropathy ([place]) 2014   in feet, likely due to spinal stenosis, DDD, HNP  . Osteoarthritis 2002   spinal stenosis, spondylolisthesis, disc protrusion multilevel in lumbar spine  . Stroke ([place])   . Upper GI bleed 08/2015   EGD: 2 small gastric AVMs, 1 actively bleeding.  Both treated with APC ablation, clipping of the bleeder    Tobacco History: History  Smoking Status  . Former Smoker  . Packs/day: 2.50  . Years: 30.00  . Types: Cigarettes  . Quit date: 06/11/1978  Smokeless Tobacco  . Never Used   Counseling given: Not Answered   Outpatient Encounter Prescriptions as of 07/12/2016  Medication Sig  . acetaminophen (TYLENOL) 650 MG CR tablet Take 1,300 mg by mouth 2 (two) times daily.  Marland Kitchen albuterol (PROAIR HFA) 108 (90 BASE) MCG/ACT inhaler Inhale 2 puffs into the lungs every 6 (six) hours as needed for wheezing or shortness of breath.  Marland Kitchen apixaban (ELIQUIS) 5 MG TABS tablet Take 5 mg by mouth 2 (two) times daily.  Marland Kitchen atorvastatin (LIPITOR) 20 MG tablet TAKE 1 TABLET BY MOUTH DAILY  .  azelastine (ASTELIN) 0.1 % nasal spray Place 2 sprays into both nostrils at bedtime. Use in each nostril as directed  . B Complex-C (SUPER B COMPLEX PO) Take 1 tablet by mouth at bedtime.   . cetirizine (ZYRTEC) 10 MG tablet Take 10 mg by mouth at bedtime.   . cholecalciferol (VITAMIN D) 1000 units tablet Take 1,000 Units by mouth daily.  Marland Kitchen escitalopram (LEXAPRO) 20 MG tablet Take 20 mg by mouth at bedtime.   . fenofibrate 160 MG tablet Take 1 tablet (160 mg total) by mouth at bedtime.  . lamoTRIgine (LAMICTAL) 150 MG tablet Take 150 mg by mouth at bedtime. Reported on  08/14/2015  . Multiple Vitamin (MULTIVITAMIN) tablet Take 1 tablet by mouth at bedtime.   . pantoprazole (PROTONIX) 40 MG tablet TAKE 1 TABLET (40 MG TOTAL) BY MOUTH DAILY BEFORE BREAKFAST.  Marland Kitchen spironolactone (ALDACTONE) 25 MG tablet Take 25 mg by mouth 3 (three) times a week. (Mondays, Wednesdays, and Fridays)  . STIOLTO RESPIMAT 2.5-2.5 MCG/ACT AERS INHALE 2 PUFFS INTO THE LUNGS DAILY  . triamcinolone cream (KENALOG) 0.1 % Apply 1 application topically 2 (two) times daily.   . verapamil (CALAN-SR) 240 MG CR tablet Take 1 tablet (240 mg total) by mouth at bedtime.  Marland Kitchen doxycycline (VIBRA-TABS) 100 MG tablet Take 1 tablet (100 mg total) by mouth 2 (two) times daily.  . predniSONE (DELTASONE) 10 MG tablet 4 tabs for 2 days, then 3 tabs for 2 days, 2 tabs for 2 days, then 1 tab for 2 days, then stop   No facility-administered encounter medications on file as of 07/12/2016.      Review of Systems  Constitutional:   No  weight loss, night sweats,  Fevers, chills, fatigue, or  lassitude.  HEENT:   No headaches,  Difficulty swallowing,  Tooth/dental problems, or  Sore throat,                No sneezing, itching, ear ache,  +nasal congestion, post nasal drip,   CV:  No chest pain,  Orthopnea, PND, swelling in lower extremities, anasarca, dizziness, palpitations, syncope.   GI  No heartburn, indigestion, abdominal pain, nausea, vomiting, diarrhea, change in bowel habits, loss of appetite, bloody stools.   Resp:    No wheezing.  No chest wall deformity  Skin: no rash or lesions.  GU: no dysuria, change in color of urine, no urgency or frequency.  No flank pain, no hematuria   MS:  No joint pain or swelling.  No decreased range of motion.  No back pain.    Physical Exam  BP 136/66 (BP Location: Left Arm, Cuff Size: Normal)   Pulse 75   Ht 5\' 3"  (1.6 m)   Wt 153 lb (69.4 kg)   SpO2 96%   BMI 27.10 kg/m   GEN: A/Ox3; pleasant , NAD, elderly    HEENT:  [place]/AT,  EACs-clear, TMs-wnl,  NOSE-clear drainage  THROAT-clear, no lesions, no postnasal drip or exudate noted.   NECK:  Supple w/ fair ROM; no JVD; normal carotid impulses w/o bruits; no thyromegaly or nodules palpated; no lymphadenopathy.    RESP  Scattered rhonchi , trace exp wheeze , talking in full sentences. . no accessory muscle use, no dullness to percussion  CARD:  RRR, no m/r/g, no peripheral edema, pulses intact, no cyanosis or clubbing.  GI:   Soft & nt; nml bowel sounds; no organomegaly or masses detected.   Musco: Warm bil, no deformities or joint swelling noted.  Neuro: alert, no focal deficits noted.    Skin: Warm, no lesions or rashes  Psych:  No change in mood or affect. No depression or anxiety.  No memory loss.  Lab Results:  CBC   Imaging: No results found.   Assessment & Plan:   COPD (chronic obstructive pulmonary disease) with emphysema ([place]) Exacerbation  xopenex neb x 1   Plan  Patient Instructions  Extend Doxycycline for additional 4 days .  Mucinex DM Twice daily As needed  Cough/congestion  Prednisone taper over next week.  Saline nasal rinses As needed   Follow up with Dr. Lamonte Sakai  3 months and As needed   Please contact office for sooner follow up if symptoms do not improve or worsen or seek emergency care         Rexene Edison, NP 07/12/2016

## 2016-07-13 ENCOUNTER — Inpatient Hospital Stay (HOSPITAL_COMMUNITY)
Admission: EM | Admit: 2016-07-13 | Discharge: 2016-07-20 | DRG: 871 | Disposition: A | Discharge status: Home Health | Payer: PPO | Attending: Internal Medicine | Admitting: Internal Medicine

## 2016-07-13 ENCOUNTER — Encounter (HOSPITAL_COMMUNITY): Payer: Self-pay | Admitting: Emergency Medicine

## 2016-07-13 ENCOUNTER — Emergency Department (HOSPITAL_COMMUNITY): Payer: PPO

## 2016-07-13 DIAGNOSIS — J029 Acute pharyngitis, unspecified: Secondary | ICD-10-CM | POA: Diagnosis present

## 2016-07-13 DIAGNOSIS — N183 Chronic kidney disease, stage 3 (moderate): Secondary | ICD-10-CM | POA: Diagnosis present

## 2016-07-13 DIAGNOSIS — I5043 Acute on chronic combined systolic (congestive) and diastolic (congestive) heart failure: Secondary | ICD-10-CM | POA: Diagnosis present

## 2016-07-13 DIAGNOSIS — Z7901 Long term (current) use of anticoagulants: Secondary | ICD-10-CM

## 2016-07-13 DIAGNOSIS — Z9981 Dependence on supplemental oxygen: Secondary | ICD-10-CM

## 2016-07-13 DIAGNOSIS — I482 Chronic atrial fibrillation, unspecified: Secondary | ICD-10-CM | POA: Diagnosis present

## 2016-07-13 DIAGNOSIS — I4891 Unspecified atrial fibrillation: Secondary | ICD-10-CM | POA: Diagnosis not present

## 2016-07-13 DIAGNOSIS — M48062 Spinal stenosis, lumbar region with neurogenic claudication: Secondary | ICD-10-CM | POA: Diagnosis present

## 2016-07-13 DIAGNOSIS — I252 Old myocardial infarction: Secondary | ICD-10-CM

## 2016-07-13 DIAGNOSIS — N189 Chronic kidney disease, unspecified: Secondary | ICD-10-CM

## 2016-07-13 DIAGNOSIS — Z882 Allergy status to sulfonamides status: Secondary | ICD-10-CM

## 2016-07-13 DIAGNOSIS — J440 Chronic obstructive pulmonary disease with acute lower respiratory infection: Secondary | ICD-10-CM | POA: Diagnosis present

## 2016-07-13 DIAGNOSIS — E871 Hypo-osmolality and hyponatremia: Secondary | ICD-10-CM | POA: Diagnosis present

## 2016-07-13 DIAGNOSIS — J441 Chronic obstructive pulmonary disease with (acute) exacerbation: Secondary | ICD-10-CM | POA: Diagnosis not present

## 2016-07-13 DIAGNOSIS — Z880 Allergy status to penicillin: Secondary | ICD-10-CM

## 2016-07-13 DIAGNOSIS — I481 Persistent atrial fibrillation: Secondary | ICD-10-CM | POA: Diagnosis present

## 2016-07-13 DIAGNOSIS — R6521 Severe sepsis with septic shock: Secondary | ICD-10-CM | POA: Diagnosis present

## 2016-07-13 DIAGNOSIS — Z66 Do not resuscitate: Secondary | ICD-10-CM | POA: Diagnosis present

## 2016-07-13 DIAGNOSIS — Z8673 Personal history of transient ischemic attack (TIA), and cerebral infarction without residual deficits: Secondary | ICD-10-CM

## 2016-07-13 DIAGNOSIS — I248 Other forms of acute ischemic heart disease: Secondary | ICD-10-CM | POA: Diagnosis present

## 2016-07-13 DIAGNOSIS — J9621 Acute and chronic respiratory failure with hypoxia: Secondary | ICD-10-CM | POA: Diagnosis present

## 2016-07-13 DIAGNOSIS — J209 Acute bronchitis, unspecified: Secondary | ICD-10-CM | POA: Diagnosis present

## 2016-07-13 DIAGNOSIS — M1990 Unspecified osteoarthritis, unspecified site: Secondary | ICD-10-CM | POA: Diagnosis present

## 2016-07-13 DIAGNOSIS — N179 Acute kidney failure, unspecified: Secondary | ICD-10-CM

## 2016-07-13 DIAGNOSIS — B338 Other specified viral diseases: Secondary | ICD-10-CM

## 2016-07-13 DIAGNOSIS — E872 Acidosis: Secondary | ICD-10-CM | POA: Diagnosis present

## 2016-07-13 DIAGNOSIS — I519 Heart disease, unspecified: Secondary | ICD-10-CM

## 2016-07-13 DIAGNOSIS — R778 Other specified abnormalities of plasma proteins: Secondary | ICD-10-CM | POA: Diagnosis present

## 2016-07-13 DIAGNOSIS — E875 Hyperkalemia: Secondary | ICD-10-CM | POA: Diagnosis present

## 2016-07-13 DIAGNOSIS — L282 Other prurigo: Secondary | ICD-10-CM | POA: Diagnosis present

## 2016-07-13 DIAGNOSIS — Z79899 Other long term (current) drug therapy: Secondary | ICD-10-CM

## 2016-07-13 DIAGNOSIS — Z87891 Personal history of nicotine dependence: Secondary | ICD-10-CM | POA: Diagnosis not present

## 2016-07-13 DIAGNOSIS — I130 Hypertensive heart and chronic kidney disease with heart failure and stage 1 through stage 4 chronic kidney disease, or unspecified chronic kidney disease: Secondary | ICD-10-CM | POA: Diagnosis present

## 2016-07-13 DIAGNOSIS — R7989 Other specified abnormal findings of blood chemistry: Secondary | ICD-10-CM | POA: Diagnosis present

## 2016-07-13 DIAGNOSIS — T380X5A Adverse effect of glucocorticoids and synthetic analogues, initial encounter: Secondary | ICD-10-CM | POA: Diagnosis present

## 2016-07-13 DIAGNOSIS — R0602 Shortness of breath: Secondary | ICD-10-CM | POA: Diagnosis not present

## 2016-07-13 DIAGNOSIS — J9600 Acute respiratory failure, unspecified whether with hypoxia or hypercapnia: Secondary | ICD-10-CM

## 2016-07-13 DIAGNOSIS — R5381 Other malaise: Secondary | ICD-10-CM

## 2016-07-13 DIAGNOSIS — L270 Generalized skin eruption due to drugs and medicaments taken internally: Secondary | ICD-10-CM | POA: Diagnosis not present

## 2016-07-13 DIAGNOSIS — K219 Gastro-esophageal reflux disease without esophagitis: Secondary | ICD-10-CM | POA: Diagnosis present

## 2016-07-13 DIAGNOSIS — Z7952 Long term (current) use of systemic steroids: Secondary | ICD-10-CM

## 2016-07-13 DIAGNOSIS — T360X5A Adverse effect of penicillins, initial encounter: Secondary | ICD-10-CM | POA: Diagnosis not present

## 2016-07-13 DIAGNOSIS — I340 Nonrheumatic mitral (valve) insufficiency: Secondary | ICD-10-CM

## 2016-07-13 DIAGNOSIS — Z8249 Family history of ischemic heart disease and other diseases of the circulatory system: Secondary | ICD-10-CM

## 2016-07-13 DIAGNOSIS — J9601 Acute respiratory failure with hypoxia: Secondary | ICD-10-CM | POA: Diagnosis present

## 2016-07-13 DIAGNOSIS — R739 Hyperglycemia, unspecified: Secondary | ICD-10-CM | POA: Diagnosis present

## 2016-07-13 DIAGNOSIS — B974 Respiratory syncytial virus as the cause of diseases classified elsewhere: Secondary | ICD-10-CM | POA: Diagnosis present

## 2016-07-13 DIAGNOSIS — I2510 Atherosclerotic heart disease of native coronary artery without angina pectoris: Secondary | ICD-10-CM

## 2016-07-13 DIAGNOSIS — A4189 Other specified sepsis: Principal | ICD-10-CM | POA: Diagnosis present

## 2016-07-13 DIAGNOSIS — F419 Anxiety disorder, unspecified: Secondary | ICD-10-CM | POA: Diagnosis present

## 2016-07-13 DIAGNOSIS — I5032 Chronic diastolic (congestive) heart failure: Secondary | ICD-10-CM | POA: Diagnosis present

## 2016-07-13 DIAGNOSIS — A419 Sepsis, unspecified organism: Secondary | ICD-10-CM | POA: Diagnosis present

## 2016-07-13 DIAGNOSIS — E785 Hyperlipidemia, unspecified: Secondary | ICD-10-CM | POA: Diagnosis present

## 2016-07-13 LAB — I-STAT TROPONIN, ED: Troponin i, poc: 0 ng/mL (ref 0.00–0.08)

## 2016-07-13 LAB — I-STAT CHEM 8, ED
BUN: 36 mg/dL — ABNORMAL HIGH (ref 6–20)
CALCIUM ION: 1.31 mmol/L (ref 1.15–1.40)
Chloride: 104 mmol/L (ref 101–111)
Creatinine, Ser: 1.1 mg/dL (ref 0.61–1.24)
GLUCOSE: 152 mg/dL — AB (ref 65–99)
HCT: 36 % — ABNORMAL LOW (ref 39.0–52.0)
Hemoglobin: 12.2 g/dL — ABNORMAL LOW (ref 13.0–17.0)
Potassium: 3.9 mmol/L (ref 3.5–5.1)
Sodium: 140 mmol/L (ref 135–145)
TCO2: 24 mmol/L (ref 0–100)

## 2016-07-13 LAB — BASIC METABOLIC PANEL
Anion gap: 15 (ref 5–15)
BUN: 35 mg/dL — ABNORMAL HIGH (ref 6–20)
CALCIUM: 10.3 mg/dL (ref 8.9–10.3)
CHLORIDE: 101 mmol/L (ref 101–111)
CO2: 24 mmol/L (ref 22–32)
CREATININE: 1.29 mg/dL — AB (ref 0.61–1.24)
GFR calc non Af Amer: 50 mL/min — ABNORMAL LOW (ref >60)
GFR, EST AFRICAN AMERICAN: 57 mL/min — AB (ref >60)
Glucose, Bld: 153 mg/dL — ABNORMAL HIGH (ref 65–99)
Potassium: 3.9 mmol/L (ref 3.5–5.1)
SODIUM: 140 mmol/L (ref 135–145)

## 2016-07-13 LAB — CBC
HCT: 35.1 % — ABNORMAL LOW (ref 39.0–52.0)
HEMOGLOBIN: 11.2 g/dL — AB (ref 13.0–17.0)
MCH: 28.8 pg (ref 26.0–34.0)
MCHC: 31.9 g/dL (ref 30.0–36.0)
MCV: 90.2 fL (ref 78.0–100.0)
Platelets: 220 10*3/uL (ref 150–400)
RBC: 3.89 MIL/uL — ABNORMAL LOW (ref 4.22–5.81)
RDW: 14.4 % (ref 11.5–15.5)
WBC: 16.6 10*3/uL — AB (ref 4.0–10.5)

## 2016-07-13 MED ORDER — DILTIAZEM HCL 100 MG IV SOLR
5.0000 mg/h | INTRAVENOUS | Status: DC
  Administered 2016-07-13: 5 mg/h via INTRAVENOUS
  Administered 2016-07-15: 10 mg/h via INTRAVENOUS
  Administered 2016-07-16: 15 mg/h via INTRAVENOUS
  Administered 2016-07-16: 5 mg/h via INTRAVENOUS
  Administered 2016-07-17: 15 mg/h via INTRAVENOUS
  Filled 2016-07-13 (×9): qty 100

## 2016-07-13 MED ORDER — LEVALBUTEROL HCL 0.63 MG/3ML IN NEBU
0.6300 mg | INHALATION_SOLUTION | Freq: Once | RESPIRATORY_TRACT | Status: AC
  Administered 2016-07-12: 0.63 mg via RESPIRATORY_TRACT

## 2016-07-13 MED ORDER — LEVALBUTEROL HCL 1.25 MG/0.5ML IN NEBU
1.2500 mg | INHALATION_SOLUTION | RESPIRATORY_TRACT | Status: DC | PRN
  Administered 2016-07-13: 1.25 mg via RESPIRATORY_TRACT
  Filled 2016-07-13: qty 0.5

## 2016-07-13 MED ORDER — DILTIAZEM LOAD VIA INFUSION
20.0000 mg | Freq: Once | INTRAVENOUS | Status: AC
  Administered 2016-07-13: 20 mg via INTRAVENOUS
  Filled 2016-07-13: qty 20

## 2016-07-13 MED ORDER — SODIUM CHLORIDE 0.9 % IV BOLUS (SEPSIS)
1000.0000 mL | Freq: Once | INTRAVENOUS | Status: AC
  Administered 2016-07-14: 1000 mL via INTRAVENOUS

## 2016-07-13 NOTE — ED Notes (Signed)
Family at bedside. 

## 2016-07-13 NOTE — ED Provider Notes (Signed)
[facility] DEPT Provider Note   CSN: [id number] Arrival date & time: 07/13/16  2239     History   Chief Complaint Chief Complaint  Patient presents with  . Shortness of Breath    HPI Eduardo [name] is a 83 y.o. male.  83 yo M with a cc of sob.  Going on for 3 days.  Cough, congestion, fevers.  Seen at pulm clinic yesterday found to have likely COPD exacerbation.  On doxy, prednisone, home inhalers.  Worsening today with fever.  Denies edema, denies CP.  Denies sick contacts.    The history is provided by the patient.  Shortness of Breath  This is a new problem. The average episode lasts 3 days. The problem occurs continuously.The current episode started more than 2 days ago. The problem has not changed since onset.Associated symptoms include a fever and cough. Pertinent negatives include no headaches, no chest pain, no vomiting, no abdominal pain and no rash. He has tried nothing for the symptoms. He has had no prior hospitalizations. Associated medical issues include COPD and chronic lung disease.    Past Medical History:  Diagnosis Date  . Allergy   . Anemia 2012, 08/2015   chronic. Acute due to large hematoma 2013. 2017: due to gastric AVM bleeding.   . Anxiety   . Atrial fibrillation ([place]) 2012  . CAD (coronary artery disease) 2012  . COPD (chronic obstructive pulmonary disease) ([place]) 2013   exertional dyspnea  . Gastric angiodysplasia with hemorrhage 08/2015  . Hx of colonic polyps 2001, 2011   adenomatous 2001. HP 2001, 2011.  Marland Kitchen Hyperlipidemia   . Myocardial infarction 06/2010.  12/2011.    "twice; 1 day apart; during/after cath". NQMI in setting severe anemia 2013.   Marland Kitchen Neuropathy ([place]) 2014   in feet, likely due to spinal stenosis, DDD, HNP  . Osteoarthritis 2002   spinal stenosis, spondylolisthesis, disc protrusion multilevel in lumbar spine  . Stroke ([place])   . Upper GI bleed 08/2015   EGD: 2 small gastric AVMs, 1 actively bleeding.  Both treated with APC  ablation, clipping of the bleeder    Patient Active Problem List   Diagnosis Date Noted  . Olecranon bursitis of right elbow 04/03/2016  . AVM (arteriovenous malformation) of stomach, acquired with hemorrhage   . Gastric angiodysplasia with hemorrhage   . Acute blood loss anemia 08/14/2015  . Encounter for anticoagulation discussion and counseling 05/03/2015  . Hemiparesis affecting right side as late effect of stroke ([place]) 03/01/2015  . Chronic anticoagulation 03/01/2015  . 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 Advanced directives, counseling/discussion 04/19/2014  . Spinal stenosis, lumbar region, with neurogenic claudication 12/23/2012  . Osteoarthritis, multiple sites 10/08/2012  . Routine general medical examination at a health care facility 02/25/2012  . Chronic diastolic HF (heart failure) ([place]) 12/28/2011    Class: Acute  . Allergic rhinitis 11/11/2008  . Hyperlipemia 04/14/2008  . Anemia 04/14/2008  . Episodic mood disorder ([place]) 04/14/2008  . Essential hypertension 04/14/2008  . Coronary atherosclerosis 04/14/2008  . Atrial fibrillation ([place]) 04/14/2008  . COPD (chronic obstructive pulmonary disease) with emphysema ([place]) 04/14/2008    Past Surgical History:  Procedure Laterality Date  . APPENDECTOMY    . CARDIAC CATHETERIZATION  2012   50% LAD and luminal irreg in RCA, 1/12  Post cath MI from damage  . CARPAL TUNNEL RELEASE     left  . CARPAL TUNNEL RELEASE  10/11/11   Dr Rush Barer  . CATARACT EXTRACTION  W/ INTRAOCULAR LENS IMPLANT  2/13   Dr [name]  . COLONOSCOPY  2001, 02/2010   Dr Deatra Ina.   . ESOPHAGOGASTRODUODENOSCOPY N/A 08/15/2015   [name] MD; 2 small gastric AVMs, 1 actively bleeding.  Both treated with APC ablation, clipping of the bleeder  . FOOT FRACTURE SURGERY     right heel repair  . FRACTURE SURGERY    . KNEE ARTHROSCOPY     left, right knee 10/2009  . MASTOIDECTOMY     x3 on right  . NASAL SEPTUM  SURGERY     nasal septal repair  . PENILE PROSTHESIS  REMOVAL    . PENILE PROSTHESIS IMPLANT     x 2--got infection after 2nd and had to remove  . PILONIDAL CYST / SINUS EXCISION    . SHOULDER ARTHROSCOPY     right  . TONSILLECTOMY AND ADENOIDECTOMY         Home Medications    Prior to Admission medications   Medication Sig Start Date End Date Taking? Authorizing Provider  acetaminophen (TYLENOL) 650 MG CR tablet Take 1,300 mg by mouth 2 (two) times daily.    Historical Provider, MD  albuterol (PROAIR HFA) 108 (90 BASE) MCG/ACT inhaler Inhale 2 puffs into the lungs every 6 (six) hours as needed for wheezing or shortness of breath. 12/08/14   Collene Gobble, MD  apixaban (ELIQUIS) 5 MG TABS tablet Take 5 mg by mouth 2 (two) times daily.    Historical Provider, MD  atorvastatin (LIPITOR) 20 MG tablet TAKE 1 TABLET BY MOUTH DAILY 02/01/16   Belva Crome, MD  azelastine (ASTELIN) 0.1 % nasal spray Place 2 sprays into both nostrils at bedtime. Use in each nostril as directed    Historical Provider, MD  B Complex-C (SUPER B COMPLEX PO) Take 1 tablet by mouth at bedtime.     Historical Provider, MD  cetirizine (ZYRTEC) 10 MG tablet Take 10 mg by mouth at bedtime.     Historical Provider, MD  cholecalciferol (VITAMIN D) 1000 units tablet Take 1,000 Units by mouth daily.    Historical Provider, MD  doxycycline (VIBRA-TABS) 100 MG tablet Take 1 tablet (100 mg total) by mouth 2 (two) times daily. 07/12/16   Tammy S Parrett, NP  escitalopram (LEXAPRO) 20 MG tablet Take 20 mg by mouth at bedtime.     Historical Provider, MD  fenofibrate 160 MG tablet Take 1 tablet (160 mg total) by mouth at bedtime. 07/09/16   Venia Carbon, MD  lamoTRIgine (LAMICTAL) 150 MG tablet Take 150 mg by mouth at bedtime. Reported on 08/14/2015 07/07/15   Historical Provider, MD  Multiple Vitamin (MULTIVITAMIN) tablet Take 1 tablet by mouth at bedtime.     Historical Provider, MD  pantoprazole (PROTONIX) 40 MG tablet TAKE 1  TABLET (40 MG TOTAL) BY MOUTH DAILY BEFORE BREAKFAST. 07/02/16   Venia Carbon, MD  predniSONE (DELTASONE) 10 MG tablet 4 tabs for 2 days, then 3 tabs for 2 days, 2 tabs for 2 days, then 1 tab for 2 days, then stop 07/12/16   Melvenia Needles, NP  spironolactone (ALDACTONE) 25 MG tablet Take 25 mg by mouth 3 (three) times a week. (Mondays, Wednesdays, and Fridays)    Historical Provider, MD  STIOLTO RESPIMAT 2.5-2.5 MCG/ACT AERS INHALE 2 PUFFS INTO THE LUNGS DAILY 06/13/16   Tanda Rockers, MD  triamcinolone cream (KENALOG) 0.1 % Apply 1 application topically 2 (two) times daily.  08/10/15   Historical Provider, MD  verapamil (CALAN-SR) 240 MG CR tablet Take 1 tablet (240 mg total) by mouth at bedtime. 04/09/16   Belva Crome, MD    Family History Family History  Problem Relation Age of Onset  . Cancer Mother     ? leukemia  . Heart attack Father   . Diabetes Neg Hx   . Hypertension Neg Hx     Social History Social History  Substance Use Topics  . Smoking status: Former Smoker    Packs/day: 2.50    Years: 30.00    Types: Cigarettes    Quit date: 06/11/1978  . Smokeless tobacco: Never Used  . Alcohol use No     Comment: QUIT 5956 alcoholic     Allergies   Penicillins; Sulfonamide derivatives; and Tape   Review of Systems Review of Systems  Constitutional: Positive for chills and fever.  HENT: Positive for congestion. Negative for facial swelling.   Eyes: Negative for discharge and visual disturbance.  Respiratory: Positive for cough and shortness of breath.   Cardiovascular: Negative for chest pain and palpitations.  Gastrointestinal: Negative for abdominal pain, diarrhea and vomiting.  Musculoskeletal: Negative for arthralgias and myalgias.  Skin: Negative for color change and rash.  Neurological: Negative for tremors, syncope and headaches.  Psychiatric/Behavioral: Negative for confusion and dysphoric mood.     Physical Exam Updated Vital Signs BP 157/86 (BP Location:  Right Arm)   Pulse (!) 138 Comment: A-FIB  Temp 99.8 F (37.7 C) (Oral)   Resp 22   SpO2 99%   Physical Exam  Constitutional: He is oriented to person, place, and time. He appears well-developed and well-nourished.  HENT:  Head: Normocephalic and atraumatic.  Eyes: EOM are normal. Pupils are equal, round, and reactive to light.  Neck: Normal range of motion. Neck supple. No JVD present.  Cardiovascular: Normal rate and regular rhythm.  Exam reveals no gallop and no friction rub.   No murmur heard. Pulmonary/Chest: No respiratory distress. He has wheezes.  Diffuse wheezes with prolonged expiration  Abdominal: He exhibits no distension and no mass. There is no tenderness. There is no rebound and no guarding.  Musculoskeletal: Normal range of motion.  Neurological: He is alert and oriented to person, place, and time.  Skin: No rash noted. No pallor.  Psychiatric: He has a normal mood and affect. His behavior is normal.  Nursing note and vitals reviewed.    ED Treatments / Results  Labs (all labs ordered are listed, but only abnormal results are displayed) Labs Reviewed  CBC - Abnormal; Notable for the following:       Result Value   WBC 16.6 (*)    RBC 3.89 (*)    Hemoglobin 11.2 (*)    HCT 35.1 (*)    All other components within normal limits  BASIC METABOLIC PANEL - Abnormal; Notable for the following:    Glucose, Bld 153 (*)    BUN 35 (*)    Creatinine, Ser 1.29 (*)    GFR calc non Af Amer 50 (*)    GFR calc Af Amer 57 (*)    All other components within normal limits  I-STAT CHEM 8, ED - Abnormal; Notable for the following:    BUN 36 (*)    Glucose, Bld 152 (*)    Hemoglobin 12.2 (*)    HCT 36.0 (*)    All other components within normal limits  BRAIN NATRIURETIC PEPTIDE  INFLUENZA PANEL BY PCR (TYPE A & B)  I-STAT TROPOININ, ED  EKG  EKG Interpretation  Date/Time:  Friday July 13 2016 22:47:45 EST Ventricular Rate:  141 PR Interval:    QRS  Duration: 119 QT Interval:  325 QTC Calculation: 498 R Axis:   82 Text Interpretation:  Atrial fibrillation with rapid ventricular response Nonspecific intraventricular conduction delay Repolarization abnormality, prob rate related Baseline wander in lead(s) V2 Since last tracing rate faster st changes likely rate related Otherwise no significant change Confirmed by Tyrone Nine MD, DANIEL [id number]) on 07/13/2016 11:01:05 PM       Radiology Dg Chest Portable 1 View  Result Date: 07/13/2016 CLINICAL DATA:  Shortness of breath. Diagnosed with bronchitis yesterday. Wheezing. Fall. EXAM: PORTABLE CHEST 1 VIEW COMPARISON:  Chest radiographs 08/14/2015 FINDINGS: Chronic hyperinflation and bronchial thickening. The heart is enlarged, stable from prior. Atherosclerosis of the thoracic aorta. Subsegmental left basilar atelectasis. No confluent consolidation, pulmonary edema, pleural effusion or pneumothorax. The bones are under mineralized, remote right clavicle fracture. Remote right rib fractures. IMPRESSION: Chronic hyperinflation and bronchial thickening. Chronic cardiomegaly.  Thoracic aortic atherosclerosis. Electronically Signed   By: Jeb Levering M.D.   On: 07/13/2016 23:21    Procedures Procedures (including critical care time)  Medications Ordered in ED Medications  diltiazem (CARDIZEM) 1 mg/mL load via infusion 20 mg (20 mg Intravenous Bolus from Bag 07/13/16 2357)    And  diltiazem (CARDIZEM) 100 mg in dextrose 5 % 100 mL (1 mg/mL) infusion (5 mg/hr Intravenous New Bag/Given 07/13/16 2353)  levalbuterol (XOPENEX) nebulizer solution 1.25 mg (1.25 mg Nebulization Given 07/13/16 2355)  sodium chloride 0.9 % bolus 1,000 mL (1,000 mLs Intravenous New Bag/Given 07/14/16 0008)     Initial Impression / Assessment and Plan / ED Course  I have reviewed the triage vital signs and the nursing notes.  Pertinent labs & imaging results that were available during my care of the patient were reviewed by me and  considered in my medical decision making (see chart for details).     83 yo M With a chief complaint of cough congestion fevers. Going on for the past 3 days. Has been doing his home inhalers and ran his own and doxycycline without improvement. Patient is only able to take a few steps at a time without becoming acutely short of breath. Is on eliquis, I doubt PE.  Likely COPD exacerbation. Patient is complicated by his history of A. fib. Is currently in A. fib with RVR. This is likely making his shortness of breath worse. We'll attempt to give him xopenex and start him on a diltiazem drip.   Patient had some improvement clinically with Xopenex and diltiazem. Heart rate now in the 110s to 120s. Patient still with diffuse wheezes. Discussed with hospitalist for admission.  CRITICAL CARE Performed by: Cecilio Asper   Total critical care time: 35 minutes  Critical care time was exclusive of separately billable procedures and treating other patients.  Critical care was necessary to treat or prevent imminent or life-threatening deterioration.  Critical care was time spent personally by me on the following activities: development of treatment plan with patient and/or surrogate as well as nursing, discussions with consultants, evaluation of patient's response to treatment, examination of patient, obtaining history from patient or surrogate, ordering and performing treatments and interventions, ordering and review of laboratory studies, ordering and review of radiographic studies, pulse oximetry and re-evaluation of patient's condition.   The patients results and plan were reviewed and discussed.   Any x-rays performed were independently reviewed by myself.  Differential diagnosis were considered with the presenting HPI.  Medications  diltiazem (CARDIZEM) 1 mg/mL load via infusion 20 mg (20 mg Intravenous Bolus from Bag 07/13/16 2357)    And  diltiazem (CARDIZEM) 100 mg in dextrose 5 % 100  mL (1 mg/mL) infusion (5 mg/hr Intravenous New Bag/Given 07/13/16 2353)  levalbuterol (XOPENEX) nebulizer solution 1.25 mg (1.25 mg Nebulization Given 07/13/16 2355)  sodium chloride 0.9 % bolus 1,000 mL (1,000 mLs Intravenous New Bag/Given 07/14/16 0008)    Vitals:   07/13/16 2247 07/13/16 2257  BP:  157/86  Pulse:  (!) 138  Resp:  22  Temp:  99.8 F (37.7 C)  TempSrc:  Oral  SpO2: 90% 99%    Final diagnoses:  COPD exacerbation (HCC)  Atrial fibrillation with rapid ventricular response (HCC)    Admission/ observation were discussed with the admitting physician, patient and/or family and they are comfortable with the plan.    Final Clinical Impressions(s) / ED Diagnoses   Final diagnoses:  COPD exacerbation ([place])  Atrial fibrillation with rapid ventricular response [facility])    New Prescriptions New Prescriptions   No medications on file     Deno Etienne, DO 07/14/16 0112

## 2016-07-13 NOTE — ED Notes (Signed)
Pt and family made aware of bed assignment 

## 2016-07-13 NOTE — Addendum Note (Signed)
Addended by: Parke Poisson E on: 07/13/2016 09:30 AM   Modules accepted: Orders

## 2016-07-13 NOTE — ED Triage Notes (Signed)
Per [facility]   SOB diagnosed with Bronchitis yesterday,.   Neb and abts and prednisone yesterday . Pt unable to cath breath with inhales. Wheezing upper lobes, and dec in lower lobes. Duo neb an improved with the pt. Rhonchi in lower lobes and some wheezing. Pain when coughing. Pt has hx of Afib and is in Afib. Pt takes   15 albuterol 1 atrovent Hx COPD    20LH

## 2016-07-14 ENCOUNTER — Encounter (HOSPITAL_COMMUNITY): Payer: Self-pay | Admitting: Nurse Practitioner

## 2016-07-14 DIAGNOSIS — Z66 Do not resuscitate: Secondary | ICD-10-CM | POA: Diagnosis not present

## 2016-07-14 DIAGNOSIS — E785 Hyperlipidemia, unspecified: Secondary | ICD-10-CM | POA: Diagnosis not present

## 2016-07-14 DIAGNOSIS — J441 Chronic obstructive pulmonary disease with (acute) exacerbation: Secondary | ICD-10-CM | POA: Diagnosis not present

## 2016-07-14 DIAGNOSIS — T360X5A Adverse effect of penicillins, initial encounter: Secondary | ICD-10-CM | POA: Diagnosis not present

## 2016-07-14 DIAGNOSIS — R0600 Dyspnea, unspecified: Secondary | ICD-10-CM | POA: Diagnosis not present

## 2016-07-14 DIAGNOSIS — I4891 Unspecified atrial fibrillation: Secondary | ICD-10-CM

## 2016-07-14 DIAGNOSIS — R739 Hyperglycemia, unspecified: Secondary | ICD-10-CM | POA: Diagnosis present

## 2016-07-14 DIAGNOSIS — J209 Acute bronchitis, unspecified: Secondary | ICD-10-CM | POA: Diagnosis not present

## 2016-07-14 DIAGNOSIS — A4189 Other specified sepsis: Secondary | ICD-10-CM | POA: Diagnosis not present

## 2016-07-14 DIAGNOSIS — I5032 Chronic diastolic (congestive) heart failure: Secondary | ICD-10-CM | POA: Diagnosis not present

## 2016-07-14 DIAGNOSIS — E871 Hypo-osmolality and hyponatremia: Secondary | ICD-10-CM | POA: Diagnosis not present

## 2016-07-14 DIAGNOSIS — I130 Hypertensive heart and chronic kidney disease with heart failure and stage 1 through stage 4 chronic kidney disease, or unspecified chronic kidney disease: Secondary | ICD-10-CM | POA: Diagnosis not present

## 2016-07-14 DIAGNOSIS — E872 Acidosis: Secondary | ICD-10-CM | POA: Diagnosis not present

## 2016-07-14 DIAGNOSIS — I481 Persistent atrial fibrillation: Secondary | ICD-10-CM | POA: Diagnosis not present

## 2016-07-14 DIAGNOSIS — F419 Anxiety disorder, unspecified: Secondary | ICD-10-CM | POA: Diagnosis not present

## 2016-07-14 DIAGNOSIS — I214 Non-ST elevation (NSTEMI) myocardial infarction: Secondary | ICD-10-CM | POA: Diagnosis not present

## 2016-07-14 DIAGNOSIS — I482 Chronic atrial fibrillation: Secondary | ICD-10-CM | POA: Diagnosis not present

## 2016-07-14 DIAGNOSIS — L270 Generalized skin eruption due to drugs and medicaments taken internally: Secondary | ICD-10-CM | POA: Diagnosis not present

## 2016-07-14 DIAGNOSIS — R778 Other specified abnormalities of plasma proteins: Secondary | ICD-10-CM | POA: Diagnosis present

## 2016-07-14 DIAGNOSIS — A419 Sepsis, unspecified organism: Secondary | ICD-10-CM

## 2016-07-14 DIAGNOSIS — J440 Chronic obstructive pulmonary disease with acute lower respiratory infection: Secondary | ICD-10-CM | POA: Diagnosis not present

## 2016-07-14 DIAGNOSIS — Z452 Encounter for adjustment and management of vascular access device: Secondary | ICD-10-CM | POA: Diagnosis not present

## 2016-07-14 DIAGNOSIS — N179 Acute kidney failure, unspecified: Secondary | ICD-10-CM | POA: Diagnosis not present

## 2016-07-14 DIAGNOSIS — J9621 Acute and chronic respiratory failure with hypoxia: Secondary | ICD-10-CM | POA: Diagnosis not present

## 2016-07-14 DIAGNOSIS — E875 Hyperkalemia: Secondary | ICD-10-CM | POA: Diagnosis not present

## 2016-07-14 DIAGNOSIS — I248 Other forms of acute ischemic heart disease: Secondary | ICD-10-CM | POA: Diagnosis not present

## 2016-07-14 DIAGNOSIS — J029 Acute pharyngitis, unspecified: Secondary | ICD-10-CM | POA: Diagnosis not present

## 2016-07-14 DIAGNOSIS — J9601 Acute respiratory failure with hypoxia: Secondary | ICD-10-CM | POA: Diagnosis present

## 2016-07-14 DIAGNOSIS — Z87891 Personal history of nicotine dependence: Secondary | ICD-10-CM | POA: Diagnosis not present

## 2016-07-14 DIAGNOSIS — L282 Other prurigo: Secondary | ICD-10-CM | POA: Diagnosis present

## 2016-07-14 DIAGNOSIS — R748 Abnormal levels of other serum enzymes: Secondary | ICD-10-CM | POA: Diagnosis not present

## 2016-07-14 DIAGNOSIS — R7989 Other specified abnormal findings of blood chemistry: Secondary | ICD-10-CM

## 2016-07-14 DIAGNOSIS — R0602 Shortness of breath: Secondary | ICD-10-CM | POA: Diagnosis not present

## 2016-07-14 DIAGNOSIS — B974 Respiratory syncytial virus as the cause of diseases classified elsewhere: Secondary | ICD-10-CM | POA: Diagnosis not present

## 2016-07-14 DIAGNOSIS — N183 Chronic kidney disease, stage 3 (moderate): Secondary | ICD-10-CM | POA: Diagnosis not present

## 2016-07-14 DIAGNOSIS — R05 Cough: Secondary | ICD-10-CM | POA: Diagnosis not present

## 2016-07-14 DIAGNOSIS — R6521 Severe sepsis with septic shock: Secondary | ICD-10-CM | POA: Diagnosis not present

## 2016-07-14 DIAGNOSIS — I2510 Atherosclerotic heart disease of native coronary artery without angina pectoris: Secondary | ICD-10-CM | POA: Diagnosis not present

## 2016-07-14 DIAGNOSIS — R5381 Other malaise: Secondary | ICD-10-CM | POA: Diagnosis not present

## 2016-07-14 LAB — CBC
HEMATOCRIT: 32.1 % — AB (ref 39.0–52.0)
Hemoglobin: 10.3 g/dL — ABNORMAL LOW (ref 13.0–17.0)
MCH: 28.9 pg (ref 26.0–34.0)
MCHC: 32.1 g/dL (ref 30.0–36.0)
MCV: 90.2 fL (ref 78.0–100.0)
Platelets: 188 10*3/uL (ref 150–400)
RBC: 3.56 MIL/uL — ABNORMAL LOW (ref 4.22–5.81)
RDW: 14.4 % (ref 11.5–15.5)
WBC: 13.8 10*3/uL — AB (ref 4.0–10.5)

## 2016-07-14 LAB — LACTIC ACID, PLASMA
LACTIC ACID, VENOUS: 3 mmol/L — AB (ref 0.5–1.9)
LACTIC ACID, VENOUS: 1.5 mmol/L (ref 0.5–1.9)

## 2016-07-14 LAB — I-STAT ARTERIAL BLOOD GAS, ED
ACID-BASE DEFICIT: 3 mmol/L — AB (ref 0.0–2.0)
Bicarbonate: 20.9 mmol/L (ref 20.0–28.0)
O2 SAT: 98 %
PH ART: 7.403 (ref 7.350–7.450)
Patient temperature: 98.6
TCO2: 22 mmol/L (ref 0–100)
pCO2 arterial: 33.5 mmHg (ref 32.0–48.0)
pO2, Arterial: 102 mmHg (ref 83.0–108.0)

## 2016-07-14 LAB — MRSA PCR SCREENING: MRSA by PCR: NEGATIVE

## 2016-07-14 LAB — BASIC METABOLIC PANEL
ANION GAP: 15 (ref 5–15)
BUN: 39 mg/dL — AB (ref 6–20)
CALCIUM: 9.9 mg/dL (ref 8.9–10.3)
CO2: 21 mmol/L — AB (ref 22–32)
Chloride: 103 mmol/L (ref 101–111)
Creatinine, Ser: 1.21 mg/dL (ref 0.61–1.24)
GFR calc Af Amer: >60 mL/min (ref >60)
GFR calc non Af Amer: 54 mL/min — ABNORMAL LOW (ref >60)
GLUCOSE: 232 mg/dL — AB (ref 65–99)
Potassium: 3.8 mmol/L (ref 3.5–5.1)
Sodium: 139 mmol/L (ref 135–145)

## 2016-07-14 LAB — GLUCOSE, CAPILLARY
GLUCOSE-CAPILLARY: 135 mg/dL — AB (ref 65–99)
GLUCOSE-CAPILLARY: 156 mg/dL — AB (ref 65–99)

## 2016-07-14 LAB — CBG MONITORING, ED
GLUCOSE-CAPILLARY: 153 mg/dL — AB (ref 65–99)
Glucose-Capillary: 93 mg/dL (ref 65–99)

## 2016-07-14 LAB — TROPONIN I
TROPONIN I: 3.9 ng/mL — AB (ref <0.03)
Troponin I: 0.48 ng/mL (ref <0.03)
Troponin I: 2.69 ng/mL (ref <0.03)

## 2016-07-14 LAB — HEPARIN LEVEL (UNFRACTIONATED): Heparin Unfractionated: >2.2 IU/mL — ABNORMAL HIGH (ref 0.30–0.70)

## 2016-07-14 LAB — I-STAT CG4 LACTIC ACID, ED: Lactic Acid, Venous: 5.29 mmol/L (ref 0.5–1.9)

## 2016-07-14 LAB — I-STAT TROPONIN, ED: TROPONIN I, POC: 0.57 ng/mL — AB (ref 0.00–0.08)

## 2016-07-14 LAB — BRAIN NATRIURETIC PEPTIDE: B Natriuretic Peptide: 261.9 pg/mL — ABNORMAL HIGH (ref 0.0–100.0)

## 2016-07-14 LAB — APTT
aPTT: 42 seconds — ABNORMAL HIGH (ref 24–36)
aPTT: 64 seconds — ABNORMAL HIGH (ref 24–36)

## 2016-07-14 LAB — INFLUENZA PANEL BY PCR (TYPE A & B)
Influenza A By PCR: NEGATIVE
Influenza B By PCR: NEGATIVE

## 2016-07-14 MED ORDER — ONDANSETRON HCL 4 MG/2ML IJ SOLN: 4.0000 mg | Freq: Four times a day (QID) | INTRAMUSCULAR | Status: DC | PRN

## 2016-07-14 MED ORDER — INSULIN ASPART 100 UNIT/ML ~~LOC~~ SOLN
0.0000 [IU] | Freq: Three times a day (TID) | SUBCUTANEOUS | Status: DC
  Administered 2016-07-14: 1 [IU] via SUBCUTANEOUS
  Administered 2016-07-15: 2 [IU] via SUBCUTANEOUS
  Administered 2016-07-15 – 2016-07-17 (×6): 1 [IU] via SUBCUTANEOUS
  Administered 2016-07-17: 2 [IU] via SUBCUTANEOUS
  Administered 2016-07-17: 3 [IU] via SUBCUTANEOUS
  Administered 2016-07-18 (×2): 2 [IU] via SUBCUTANEOUS
  Administered 2016-07-18: 1 [IU] via SUBCUTANEOUS
  Administered 2016-07-20: 2 [IU] via SUBCUTANEOUS

## 2016-07-14 MED ORDER — AZELASTINE HCL 0.1 % NA SOLN
2.0000 | Freq: Every day | NASAL | Status: DC
  Administered 2016-07-14 – 2016-07-19 (×7): 2 via NASAL
  Filled 2016-07-14 (×2): qty 30

## 2016-07-14 MED ORDER — ACETAMINOPHEN 500 MG PO TABS
1000.0000 mg | ORAL_TABLET | Freq: Two times a day (BID) | ORAL | Status: DC
  Administered 2016-07-14 – 2016-07-20 (×13): 1000 mg via ORAL
  Filled 2016-07-14 (×13): qty 2

## 2016-07-14 MED ORDER — ADULT MULTIVITAMIN W/MINERALS CH
1.0000 | ORAL_TABLET | Freq: Every day | ORAL | Status: DC
  Administered 2016-07-14 – 2016-07-19 (×7): 1 via ORAL
  Filled 2016-07-14 (×7): qty 1

## 2016-07-14 MED ORDER — FENOFIBRATE 160 MG PO TABS
160.0000 mg | ORAL_TABLET | Freq: Every day | ORAL | Status: DC
  Administered 2016-07-14 – 2016-07-19 (×7): 160 mg via ORAL
  Filled 2016-07-14 (×7): qty 1

## 2016-07-14 MED ORDER — LORATADINE 10 MG PO TABS
10.0000 mg | ORAL_TABLET | Freq: Every day | ORAL | Status: DC
  Administered 2016-07-14 – 2016-07-20 (×7): 10 mg via ORAL
  Filled 2016-07-14 (×7): qty 1

## 2016-07-14 MED ORDER — METHYLPREDNISOLONE SODIUM SUCC 125 MG IJ SOLR
60.0000 mg | Freq: Three times a day (TID) | INTRAMUSCULAR | Status: DC
  Administered 2016-07-14 – 2016-07-15 (×3): 60 mg via INTRAVENOUS
  Filled 2016-07-14 (×3): qty 2

## 2016-07-14 MED ORDER — LEVALBUTEROL HCL 0.63 MG/3ML IN NEBU
0.6300 mg | INHALATION_SOLUTION | Freq: Four times a day (QID) | RESPIRATORY_TRACT | Status: DC
  Administered 2016-07-14 – 2016-07-17 (×16): 0.63 mg via RESPIRATORY_TRACT
  Filled 2016-07-14 (×16): qty 3

## 2016-07-14 MED ORDER — PANTOPRAZOLE SODIUM 40 MG PO TBEC
40.0000 mg | DELAYED_RELEASE_TABLET | Freq: Two times a day (BID) | ORAL | Status: DC
  Administered 2016-07-14 – 2016-07-20 (×12): 40 mg via ORAL
  Filled 2016-07-14 (×12): qty 1

## 2016-07-14 MED ORDER — LEVOFLOXACIN IN D5W 500 MG/100ML IV SOLN
500.0000 mg | INTRAVENOUS | Status: DC
  Administered 2016-07-14: 500 mg via INTRAVENOUS
  Filled 2016-07-14: qty 100

## 2016-07-14 MED ORDER — APIXABAN 5 MG PO TABS
5.0000 mg | ORAL_TABLET | Freq: Two times a day (BID) | ORAL | Status: DC
  Administered 2016-07-14 (×2): 5 mg via ORAL
  Filled 2016-07-14 (×2): qty 1

## 2016-07-14 MED ORDER — LAMOTRIGINE 150 MG PO TABS
150.0000 mg | ORAL_TABLET | Freq: Every day | ORAL | Status: DC
  Administered 2016-07-14 – 2016-07-19 (×7): 150 mg via ORAL
  Filled 2016-07-14 (×8): qty 1

## 2016-07-14 MED ORDER — METHYLPREDNISOLONE SODIUM SUCC 125 MG IJ SOLR
125.0000 mg | INTRAMUSCULAR | Status: AC
  Administered 2016-07-14: 125 mg via INTRAVENOUS
  Filled 2016-07-14: qty 2

## 2016-07-14 MED ORDER — ATORVASTATIN CALCIUM 20 MG PO TABS
20.0000 mg | ORAL_TABLET | Freq: Every day | ORAL | Status: DC
  Filled 2016-07-14: qty 1

## 2016-07-14 MED ORDER — ACETAMINOPHEN 325 MG PO TABS: 650.0000 mg | ORAL_TABLET | Freq: Four times a day (QID) | ORAL | Status: DC | PRN

## 2016-07-14 MED ORDER — TRIAMCINOLONE ACETONIDE 0.1 % EX CREA: 1.0000 "application " | TOPICAL_CREAM | Freq: Two times a day (BID) | CUTANEOUS | Status: DC | PRN

## 2016-07-14 MED ORDER — ONDANSETRON HCL 4 MG PO TABS: 4.0000 mg | ORAL_TABLET | Freq: Four times a day (QID) | ORAL | Status: DC | PRN

## 2016-07-14 MED ORDER — ARFORMOTEROL TARTRATE 15 MCG/2ML IN NEBU
15.0000 ug | INHALATION_SOLUTION | Freq: Two times a day (BID) | RESPIRATORY_TRACT | Status: DC
  Administered 2016-07-14 – 2016-07-19 (×12): 15 ug via RESPIRATORY_TRACT
  Filled 2016-07-14 (×13): qty 2

## 2016-07-14 MED ORDER — LEVOFLOXACIN IN D5W 500 MG/100ML IV SOLN
500.0000 mg | INTRAVENOUS | Status: AC
  Administered 2016-07-16 – 2016-07-18 (×2): 500 mg via INTRAVENOUS
  Filled 2016-07-14 (×2): qty 100

## 2016-07-14 MED ORDER — INSULIN ASPART 100 UNIT/ML ~~LOC~~ SOLN: 0.0000 [IU] | Freq: Every day | SUBCUTANEOUS | Status: DC

## 2016-07-14 MED ORDER — GUAIFENESIN ER 600 MG PO TB12
600.0000 mg | ORAL_TABLET | Freq: Two times a day (BID) | ORAL | Status: DC
  Administered 2016-07-14 – 2016-07-20 (×14): 600 mg via ORAL
  Filled 2016-07-14 (×14): qty 1

## 2016-07-14 MED ORDER — HYDROCOD POLST-CPM POLST ER 10-8 MG/5ML PO SUER
5.0000 mL | Freq: Two times a day (BID) | ORAL | Status: DC
  Administered 2016-07-14 – 2016-07-19 (×10): 5 mL via ORAL
  Filled 2016-07-14 (×11): qty 5

## 2016-07-14 MED ORDER — ESCITALOPRAM OXALATE 20 MG PO TABS
20.0000 mg | ORAL_TABLET | Freq: Every day | ORAL | Status: DC
  Administered 2016-07-14 – 2016-07-19 (×7): 20 mg via ORAL
  Filled 2016-07-14: qty 1
  Filled 2016-07-14: qty 2
  Filled 2016-07-14 (×5): qty 1

## 2016-07-14 MED ORDER — LEVALBUTEROL HCL 0.63 MG/3ML IN NEBU
0.6300 mg | INHALATION_SOLUTION | RESPIRATORY_TRACT | Status: DC | PRN
  Filled 2016-07-14: qty 3

## 2016-07-14 MED ORDER — PANTOPRAZOLE SODIUM 40 MG PO TBEC
40.0000 mg | DELAYED_RELEASE_TABLET | Freq: Every day | ORAL | Status: DC
  Administered 2016-07-14: 40 mg via ORAL
  Filled 2016-07-14: qty 1

## 2016-07-14 MED ORDER — ACETAMINOPHEN 650 MG RE SUPP: 650.0000 mg | Freq: Four times a day (QID) | RECTAL | Status: DC | PRN

## 2016-07-14 MED ORDER — VANCOMYCIN HCL 500 MG IV SOLR
500.0000 mg | Freq: Two times a day (BID) | INTRAVENOUS | Status: DC
  Administered 2016-07-14 – 2016-07-15 (×4): 500 mg via INTRAVENOUS
  Filled 2016-07-14 (×5): qty 500

## 2016-07-14 MED ORDER — SPIRONOLACTONE 25 MG PO TABS
25.0000 mg | ORAL_TABLET | ORAL | Status: DC
  Administered 2016-07-16 – 2016-07-18 (×2): 25 mg via ORAL
  Filled 2016-07-14 (×2): qty 1

## 2016-07-14 MED ORDER — BUDESONIDE 0.5 MG/2ML IN SUSP
0.5000 mg | Freq: Two times a day (BID) | RESPIRATORY_TRACT | Status: DC
  Administered 2016-07-14 – 2016-07-19 (×12): 0.5 mg via RESPIRATORY_TRACT
  Filled 2016-07-14 (×13): qty 2

## 2016-07-14 MED ORDER — FLUTICASONE PROPIONATE 50 MCG/ACT NA SUSP
2.0000 | Freq: Two times a day (BID) | NASAL | Status: DC
  Administered 2016-07-14 – 2016-07-20 (×12): 2 via NASAL
  Filled 2016-07-14 (×2): qty 16

## 2016-07-14 MED ORDER — VERAPAMIL HCL ER 240 MG PO TBCR
240.0000 mg | EXTENDED_RELEASE_TABLET | Freq: Every day | ORAL | Status: DC
  Administered 2016-07-14 – 2016-07-19 (×7): 240 mg via ORAL
  Filled 2016-07-14 (×9): qty 1

## 2016-07-14 MED ORDER — HEPARIN (PORCINE) IN NACL 100-0.45 UNIT/ML-% IJ SOLN
1150.0000 [IU]/h | INTRAMUSCULAR | Status: DC
  Administered 2016-07-14: 850 [IU]/h via INTRAVENOUS
  Filled 2016-07-14 (×2): qty 250

## 2016-07-14 NOTE — Plan of Care (Signed)
Problem: Education: Goal: Knowledge of [place] [facility] Education information/materials will improve Outcome: Progressing Discussed with the patient and his wife about breathing treatments and condom catheter to reduce his labored breathing upon excretion with some teach back displayed.

## 2016-07-14 NOTE — H&P (Signed)
History and Physical    Eduardo [name] TKW:[id number] DOB: [birth year] DOA: 07/13/2016  Referring MD/NP/PA: Deno Etienne PCP: [name], MD  Patient coming from: Home  Chief Complaint:  Shortness of breath  HPI: Eduardo [name] is a 83 y.o. male with medical history significant of  HTN, HLD, chronic a.fib on Eliquis (followed by Dr. Daneen Schick), COPD not on oxygen at home( followed by Dr. Lamonte Sakai), CAD s/p MI w/ PCI, CVA; who presents with complaints of progressively worsening shortness of breath and cough over the last 3 days. Patient reports that the cough was productive of a white gray sputum. He had been utilizing his home breathing treatments and inhalers as prescribed without relief in symptoms. 2 days ago he was seen in his pulmonologist office and thought to have a COPD exacerbation. He was advised to continue doxycycline, prednisone taper, and utilize mucinex. The patient had been on a seven-day course of doxycycline prescribed by his dermatologist for a itchy rash of his legs and back that he had been dealing with. Patient reports taking prednisone and continuing the doxycycline.That night the patient developed a fever  up to 100.95F, but initially felt better upon waking up. By midafternoon was feeling more congested and coughing more. Patient denies any significant chest pain, nausea, vomiting, headache, or diarrhea.  ED Course: Upon admission patient was seen to be in atrial fibrillation with heart rates up into the 140s requiring is oxygen to maintain O2 saturations. WBC 16.6, BUN 36, creatinine 1.29, BNP 261, troponin 0.0 Chest x-ray showed cardiomegaly and hyperinflation without  acute infiltrate.  He was started on a diltiazem drip due to elevated heart rates.  Review of S ystems: As per HPI otherwise 10 point review of systems negative.   Past Medical History:  Diagnosis Date  . Allergy   . Anemia 2012, 08/2015   chronic. Acute due to large hematoma 2013. 2017: due to  gastric AVM bleeding.   . Anxiety   . Atrial fibrillation ([place]) 2012  . CAD (coronary artery disease) 2012  . COPD (chronic obstructive pulmonary disease) ([place]) 2013   exertional dyspnea  . Gastric angiodysplasia with hemorrhage 08/2015  . Hx of colonic polyps 2001, 2011   adenomatous 2001. HP 2001, 2011.  Marland Kitchen Hyperlipidemia   . Myocardial infarction 06/2010.  12/2011.    "twice; 1 day apart; during/after cath". NQMI in setting severe anemia 2013.   Marland Kitchen Neuropathy ([place]) 2014   in feet, likely due to spinal stenosis, DDD, HNP  . Osteoarthritis 2002   spinal stenosis, spondylolisthesis, disc protrusion multilevel in lumbar spine  . Stroke ([place])   . Upper GI bleed 08/2015   EGD: 2 small gastric AVMs, 1 actively bleeding.  Both treated with APC ablation, clipping of the bleeder    Past Surgical History:  Procedure Laterality Date  . APPENDECTOMY    . CARDIAC CATHETERIZATION  2012   50% LAD and luminal irreg in RCA, 1/12  Post cath MI from damage  . CARPAL TUNNEL RELEASE     left  . CARPAL TUNNEL RELEASE  10/11/11   Dr Rush Barer  . CATARACT EXTRACTION W/ INTRAOCULAR LENS IMPLANT  2/13   Dr [name]  . COLONOSCOPY  2001, 02/2010   Dr Deatra Ina.   . ESOPHAGOGASTRODUODENOSCOPY N/A 08/15/2015   [name] MD; 2 small gastric AVMs, 1 actively bleeding.  Both treated with APC ablation, clipping of the bleeder  . FOOT FRACTURE SURGERY     right heel repair  .  FRACTURE SURGERY    . KNEE ARTHROSCOPY     left, right knee 10/2009  . MASTOIDECTOMY     x3 on right  . NASAL SEPTUM SURGERY     nasal septal repair  . PENILE PROSTHESIS  REMOVAL    . PENILE PROSTHESIS IMPLANT     x 2--got infection after 2nd and had to remove  . PILONIDAL CYST / SINUS EXCISION    . SHOULDER ARTHROSCOPY     right  . TONSILLECTOMY AND ADENOIDECTOMY       reports that he quit smoking about 38 years ago. His smoking use included Cigarettes. He has a 75.00 pack-year smoking history. He has never used  smokeless tobacco. He reports that he does not drink alcohol or use drugs.  Allergies  Allergen Reactions  . Penicillins Itching    Has patient had a PCN reaction causing immediate rash, facial/tongue/throat swelling, SOB or lightheadedness with hypotension: Yes Has patient had a PCN reaction causing severe rash involving mucus membranes or skin necrosis: No Has patient had a PCN reaction that required hospitalization No Has patient had a PCN reaction occurring within the last 10 years: No If all of the above answers are "NO", then may proceed with Cephalosporin use.   . Sulfonamide Derivatives Other (See Comments)    "it affected my kidneys"  . Tape Other (See Comments)    Arms black and blue, tears skin.  Please use "paper" tape    Family History  Problem Relation Age of Onset  . Cancer Mother     ? leukemia  . Heart attack Father   . Diabetes Neg Hx   . Hypertension Neg Hx     Prior to Admission medications   Medication Sig Start Date End Date Taking? Authorizing Provider  acetaminophen (TYLENOL) 650 MG CR tablet Take 1,300 mg by mouth 2 (two) times daily.   Yes Historical Provider, MD  albuterol (PROAIR HFA) 108 (90 BASE) MCG/ACT inhaler Inhale 2 puffs into the lungs every 6 (six) hours as needed for wheezing or shortness of breath. 12/08/14  Yes Collene Gobble, MD  apixaban (ELIQUIS) 5 MG TABS tablet Take 5 mg by mouth 2 (two) times daily.   Yes Historical Provider, MD  atorvastatin (LIPITOR) 20 MG tablet TAKE 1 TABLET BY MOUTH DAILY 02/01/16  Yes Belva Crome, MD  azelastine (ASTELIN) 0.1 % nasal spray Place 2 sprays into both nostrils at bedtime. Use in each nostril as directed   Yes Historical Provider, MD  B Complex-C (SUPER B COMPLEX PO) Take 1 tablet by mouth at bedtime.    Yes Historical Provider, MD  cetirizine (ZYRTEC) 10 MG tablet Take 10 mg by mouth at bedtime.    Yes Historical Provider, MD  cholecalciferol (VITAMIN D) 1000 units tablet Take 1,000 Units by mouth  daily.   Yes Historical Provider, MD  doxycycline (VIBRA-TABS) 100 MG tablet Take 1 tablet (100 mg total) by mouth 2 (two) times daily. Patient taking differently: Take 100 mg by mouth at bedtime.  07/12/16  Yes Tammy S Parrett, NP  escitalopram (LEXAPRO) 20 MG tablet Take 20 mg by mouth at bedtime.    Yes Historical Provider, MD  fenofibrate 160 MG tablet Take 1 tablet (160 mg total) by mouth at bedtime. 07/09/16  Yes Venia Carbon, MD  lamoTRIgine (LAMICTAL) 150 MG tablet Take 150 mg by mouth at bedtime. Reported on 08/14/2015 07/07/15  Yes Historical Provider, MD  Multiple Vitamin (MULTIVITAMIN) tablet Take 1 tablet by  mouth at bedtime.    Yes Historical Provider, MD  pantoprazole (PROTONIX) 40 MG tablet TAKE 1 TABLET (40 MG TOTAL) BY MOUTH DAILY BEFORE BREAKFAST. 07/02/16  Yes Venia Carbon, MD  predniSONE (DELTASONE) 10 MG tablet 4 tabs for 2 days, then 3 tabs for 2 days, 2 tabs for 2 days, then 1 tab for 2 days, then stop 07/12/16  Yes Tammy S Parrett, NP  spironolactone (ALDACTONE) 25 MG tablet Take 25 mg by mouth 3 (three) times a week. (Mondays, Wednesdays, and Fridays)   Yes Historical Provider, MD  STIOLTO RESPIMAT 2.5-2.5 MCG/ACT AERS INHALE 2 PUFFS INTO THE LUNGS DAILY 06/13/16  Yes Tanda Rockers, MD  triamcinolone cream (KENALOG) 0.1 % Apply 1 application topically 2 (two) times daily as needed (for skin).  08/10/15  Yes Historical Provider, MD  verapamil (CALAN-SR) 240 MG CR tablet Take 1 tablet (240 mg total) by mouth at bedtime. 04/09/16  Yes Belva Crome, MD    Physical Exam:   Constitutional: Well-developed male who appears in moderate respiratory distress Vitals:   07/13/16 2247 07/13/16 2257  BP:  157/86  Pulse:  (!) 138  Resp:  22  Temp:  99.8 F (37.7 C)  TempSrc:  Oral  SpO2: 90% 99%   Eyes: PERRL, lids and conjunctivae normal ENMT: Mucous membranes are moist. Posterior pharynx clear of any exudate or lesions.  Neck: normal, supple, no masses, no  thyromegaly Respiratory: Tachypneic with diffuse expiratory wheezes appreciated throughout both lungs fields. Patient only able to shorten sentences Cardiovascular: Irregularly irregular with castrate, no murmurs / rubs / gallops. Trace lower  extremity edema. 2+ pedal pulses. No carotid bruits.  Abdomen: no tenderness, no masses palpated. No hepatosplenomegaly. Bowel sounds positive.  Musculoskeletal: no clubbing / cyanosis. No joint deformity upper and lower extremities. Good ROM, no contractures. Normal muscle tone.  Skin: Positive nonspecific rash of the lower extremities and back noted. No other lesions, ulcers. No induration Neurologic: CN 2-12 grossly intact. Sensation intact, DTR normal. Strength 5/5 in all 4.  Psychiatric: Normal judgment and insight. Alert and oriented x 3. Anxious mood.     Labs on Admission: I have personally reviewed following labs and imaging studies  CBC:  Recent Labs Lab 07/13/16 2254 07/13/16 2313  WBC 16.6*  --   HGB 11.2* 12.2*  HCT 35.1* 36.0*  MCV 90.2  --   PLT 220  --    Basic Metabolic Panel:  Recent Labs Lab 07/13/16 2254 07/13/16 2313  NA 140 140  K 3.9 3.9  CL 101 104  CO2 24  --   GLUCOSE 153* 152*  BUN 35* 36*  CREATININE 1.29* 1.10  CALCIUM 10.3  --    GFR: Estimated Creatinine Clearance: 44.5 mL/min (by C-G formula based on SCr of 1.1 mg/dL). Liver Function Tests: No results for input(s): AST, ALT, ALKPHOS, BILITOT, PROT, ALBUMIN in the last 168 hours. No results for input(s): LIPASE, AMYLASE in the last 168 hours. No results for input(s): AMMONIA in the last 168 hours. Coagulation Profile: No results for input(s): INR, PROTIME in the last 168 hours. Cardiac Enzymes: No results for input(s): CKTOTAL, CKMB, CKMBINDEX, TROPONINI in the last 168 hours. BNP (last 3 results) No results for input(s): PROBNP in the last 8760 hours. HbA1C: No results for input(s): HGBA1C in the last 72 hours. CBG: No results for input(s):  GLUCAP in the last 168 hours. Lipid Profile: No results for input(s): CHOL, HDL, LDLCALC, TRIG, CHOLHDL, LDLDIRECT in the last  72 hours. Thyroid Function Tests: No results for input(s): TSH, T4TOTAL, FREET4, T3FREE, THYROIDAB in the last 72 hours. Anemia Panel: No results for input(s): VITAMINB12, FOLATE, FERRITIN, TIBC, IRON, RETICCTPCT in the last 72 hours. Urine analysis:    Component Value Date/Time   COLORURINE YELLOW 12/31/2014 0037   APPEARANCEUR CLEAR 12/31/2014 0037   LABSPEC 1.017 12/31/2014 0037   PHURINE 6.0 12/31/2014 0037   GLUCOSEU NEGATIVE 12/31/2014 0037   HGBUR NEGATIVE 12/31/2014 0037   BILIRUBINUR NEGATIVE 12/31/2014 0037   KETONESUR NEGATIVE 12/31/2014 0037   PROTEINUR 30 (A) 12/31/2014 0037   UROBILINOGEN 0.2 12/31/2014 0037   NITRITE NEGATIVE 12/31/2014 0037   LEUKOCYTESUR NEGATIVE 12/31/2014 0037   Sepsis Labs: No results found for this or any previous visit (from the past 240 hour(s)).   Radiological Exams on Admission: Dg Chest Portable 1 View  Result Date: 07/13/2016 CLINICAL DATA:  Shortness of breath. Diagnosed with bronchitis yesterday. Wheezing. Fall. EXAM: PORTABLE CHEST 1 VIEW COMPARISON:  Chest radiographs 08/14/2015 FINDINGS: Chronic hyperinflation and bronchial thickening. The heart is enlarged, stable from prior. Atherosclerosis of the thoracic aorta. Subsegmental left basilar atelectasis. No confluent consolidation, pulmonary edema, pleural effusion or pneumothorax. The bones are under mineralized, remote right clavicle fracture. Remote right rib fractures. IMPRESSION: Chronic hyperinflation and bronchial thickening. Chronic cardiomegaly.  Thoracic aortic atherosclerosis. Electronically Signed   By: Jeb Levering M.D.   On: 07/13/2016 23:21    EKG: Independently reviewed. Atrial fibrillation w/ RVR 141 bpm  Assessment/Plan Acute respiratory failure with hypoxia / COPD exacerbation:  -  admit to stepdown  - Continuous pulse oximetry with  nasal cannula oxygen and keep O2 sats greater than 92% - Check sputum culture, respiratory viral panel, urine legionella and strep - Solu-Medrol IV - Xopenex  - Brovana and Budesonide nebs - Solu-Medrol 125 mg IV 1 dose now, then 60 mg IV every 8 hours  SIRS/SEPSIS: The patient presents with complaints of cough and congestion found to be tachycardic and  tachypneic with new oxygen requirement. Previously noted to be febrile up to 100.8 at home with elevated WBC although this could be secondary to recent steroids. Chest x-ray showing chronic hyperinflation with bronchial thickening. Patient had artery been on doxycycline with worsening symptoms therefore this was discontinued and antibiotic coverage was broadened. - Follow-up blood cultures and other cultures as seen above - Sepsis order set initiated - Check stat lactic acid - Empiric antibiotics of vancomycin and Levaquin   Atrial fibrillation on chronic anticoagulation: Patient on  Eliquis at home. Reports baseline heart rates into the 100's Chadsvasc = 5. - Diltazem gtt, wean off as able - Continue verapamil and Eliquis - Consider need to consult cardiology  Elevated troponin: Patient's initial troponin 0.48 suspect secondary to supply demand. Patient currently complaining of no pain. Upon review of patient's previous troponins it seems that he is chronically elevated. - continue to monitor  Diastolic CHF: Romie Minus presents with mildly elevated BNP of 261. Patient does not appear fluid overloaded at this time. Last EF noted to be 60-65%  - Continue spironolactone  Chronic kidney disease stage III: Patient's baseline creatinine previously been noted to be 0.95 to 1.12 in the past. He presents with elevation in creatinine to 1.29 and BUN of 35. - Recheck BMP in a.m.   Pruritic rash: Patient was prescribed a seven-day course of doxycycline by his dermatologist initially or. - Continue triamcinolone   Dyslipidemia   - Continue atorvastatin  and  fenofibrate   Hyperglycemia likely secondary  to steroids  Anxiety - continue Lexapro  Gerd - continue Protonix  DVT prophylaxis: Eliquis Code Status:  partial Family Communication: Discussed plan of care with the patient has one present at bedside Disposition Plan: Likely discharge home once medically stable  Consults called: None Admission status: Inpatient  Norval Morton MD [facility] Pager [phone]  If 7PM-7AM, please contact night-coverage [email or web address] Password [facility]  07/14/2016, 12:36 AM

## 2016-07-14 NOTE — Consult Note (Signed)
Cardiology Consult    Patient ID: Eduardo [name] MRN: [id number], DOB/AGE: 09-Aug-1930   Admit date: 07/13/2016 Date of Consult: 07/14/2016  Primary Physician: [name], MD Primary Cardiologist: Linard Millers, MD  Requesting Provider: I. Doyle Askew, MD  Patient Profile    83 year old male with a history of CAD, chronic atrial fibrillation on eliquis, COPD, hyperlipidemia, and stroke, who presented to the emergency department on February 2 with a four-day history of progressive dyspnea, cough, and wheezing, and was noted to have an elevated troponin.  Past Medical History   Past Medical History:  Diagnosis Date  . Allergy   . Anemia 2012, 08/2015   chronic. Acute due to large hematoma 2013. 2017: due to gastric AVM bleeding.   . Anxiety   . Atrial fibrillation ([place]) 2012   a.  CHA2DS2VASc = 6-->eliquis;  b. 12/2015 Echo: EF 60-65%, no rwma, LVH, mild AS/MS/MR, sev dil LA, mild TR, PASP 46mmHg.  Marland Kitchen CAD (coronary artery disease)    a. 06/2010 Cath: LM nl, LAD 50p/m, LCX 58m, RCA 50p/m -->catheter dissection but good flow, 70d, RPDA 50-70;  b. 06/2010 Relook cath due to bradycardia and inf ST elev: RCA dissection @ ostium extending into coronary cusp and prox RCA, Ao dissection-->less staining than previously-->Med Rx.  Marland Kitchen COPD (chronic obstructive pulmonary disease) ([place]) 2013   exertional dyspnea  . Gastric angiodysplasia with hemorrhage 08/2015  . Hx of colonic polyps 2001, 2011   adenomatous 2001. HP 2001, 2011.  Marland Kitchen Hyperlipidemia   . Myocardial infarction 06/2010.  12/2011.   "twice; 1 day apart; during/after cath". NQMI in setting severe anemia 2013.   Marland Kitchen Neuropathy ([place]) 2014   in feet, likely due to spinal stenosis, DDD, HNP  . Osteoarthritis 2002   spinal stenosis, spondylolisthesis, disc protrusion multilevel in lumbar spine  . Stroke ([place]) 2016  . Upper GI bleed 08/2015   EGD: 2 small gastric AVMs, 1 actively bleeding.  Both treated with APC ablation, clipping of the bleeder      Past Surgical History:  Procedure Laterality Date  . APPENDECTOMY    . CARDIAC CATHETERIZATION  2012   50% LAD and luminal irreg in RCA, 1/12  Post cath MI from damage  . CARPAL TUNNEL RELEASE     left  . CARPAL TUNNEL RELEASE  10/11/11   Dr Rush Barer  . CATARACT EXTRACTION W/ INTRAOCULAR LENS IMPLANT  2/13   Dr [name]  . COLONOSCOPY  2001, 02/2010   Dr Deatra Ina.   . ESOPHAGOGASTRODUODENOSCOPY N/A 08/15/2015   [name] MD; 2 small gastric AVMs, 1 actively bleeding.  Both treated with APC ablation, clipping of the bleeder  . FOOT FRACTURE SURGERY     right heel repair  . FRACTURE SURGERY    . KNEE ARTHROSCOPY     left, right knee 10/2009  . MASTOIDECTOMY     x3 on right  . NASAL SEPTUM SURGERY     nasal septal repair  . PENILE PROSTHESIS  REMOVAL    . PENILE PROSTHESIS IMPLANT     x 2--got infection after 2nd and had to remove  . PILONIDAL CYST / SINUS EXCISION    . SHOULDER ARTHROSCOPY     right  . TONSILLECTOMY AND ADENOIDECTOMY       Allergies  Allergies  Allergen Reactions  . Penicillins Itching    Has patient had a PCN reaction causing immediate rash, facial/tongue/throat swelling, SOB or lightheadedness with hypotension: Yes Has patient had a PCN reaction causing severe  rash involving mucus membranes or skin necrosis: No Has patient had a PCN reaction that required hospitalization No Has patient had a PCN reaction occurring within the last 10 years: No If all of the above answers are "NO", then may proceed with Cephalosporin use.   . Sulfonamide Derivatives Other (See Comments)    "it affected my kidneys"  . Tape Other (See Comments)    Arms black and blue, tears skin.  Please use "paper" tape    History of Present Illness    83 year old male with the above complex past medical history. 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 This is ultimately medically managed. He also has a history of chronic atrial fibrillation and has been anticoagulated with eliquis since his stroke in 2016. Other history includes hyperlipidemia, history GI bleed, anemia, anxiety, and COPD followed by pulmonology.  He was in his usual state of health until approximate 4 days prior to admission, when he began to experience progressive dyspnea, cough, and wheezing. At the time, he had been under treatment for a rash and on doxycycline therapy. He saw his primary care provider approximately 3 days ago and was advised to continue doxycycline. Oral steroids were added. Unfortunately, he had worsening of dyspnea and wheezing and on the evening of February 2 was having significant trouble breathing at rest. He denies chest pain. He presented to the [facility] ED for evaluation and was noted to be in rapid atrial fibrillation in the 140s. Chest x-ray showed chronic hyperinflation and bronchial thickening. WBCs were elevated at 13.8. Troponin has risen to 2.69. He was placed on IV diltiazem for rate control with improvement. He is also receiving IV Solu-Medrol, and inhalers. Currently denies chest pain. We've been asked to evaluate secondary to elevated troponin.  Inpatient Medications    . acetaminophen  1,000 mg Oral BID  . apixaban  5 mg Oral BID  . arformoterol  15 mcg Nebulization BID  . atorvastatin  20 mg Oral Daily  . azelastine  2 spray Each Nare QHS  . budesonide (PULMICORT) nebulizer solution  0.5 mg Nebulization BID  . escitalopram  20 mg Oral QHS  . fenofibrate  160 mg Oral QHS  . guaiFENesin  600 mg Oral BID  . insulin aspart  0-5 Units Subcutaneous QHS  . insulin aspart  0-9 Units Subcutaneous TID WC  . lamoTRIgine  150 mg Oral QHS  . levalbuterol  0.63 mg Nebulization Q6H  . loratadine  10 mg Oral Daily  . methylPREDNISolone (SOLU-MEDROL) injection  60 mg  Intravenous Q8H  . multivitamin with minerals  1 tablet Oral QHS  . pantoprazole  40 mg Oral QAC breakfast  . [START ON 07/16/2016] spironolactone  25 mg Oral Once per day on Mon Wed Fri  . verapamil  240 mg Oral QHS    Family History    Family History  Problem Relation Age of Onset  . Cancer Mother     ? leukemia  . Heart attack Father   . Diabetes Neg Hx   . Hypertension Neg Hx     Social History    Social History   Social History  . Marital status: Married    Spouse name: N/A  . Number of children: 4  . Years of education: N/A   Occupational History  . Retired  electrical engineer(on the job trainin)    Social History Main Topics  . Smoking status: Former Smoker    Packs/day: 2.50    Years: 30.00    Types: Cigarettes    Quit date: 06/11/1978  . Smokeless tobacco: Never Used  . Alcohol use No     Comment: QUIT [id number] alcoholic  . Drug use: No  . Sexual activity: Not Currently   Other Topics Concern  . Not on file   Social History Narrative   Grew up in [place], [place].  Lives in [place].   Married---2nd   2 children ([place], [place]  and near [place])--2 stepchildren   Former Smoker--quit in 1980   Alcohol use-no. Quit in 1980--alcoholic      Has living will.   Has designated wife, then step daughter [name] have health care POA.   Would accept resuscitation attempts.   No feeding tube if cognitively unaware.     Review of Systems    General:  No chills, fever, night sweats or weight changes.  Cardiovascular:  No chest pain, +++ dyspnea on exertion, no edema, orthopnea, palpitations, paroxysmal nocturnal dyspnea. Dermatological: No rash, lesions/masses Respiratory: +++ cough, dyspnea Urologic: No hematuria, dysuria Abdominal:   No nausea, vomiting, diarrhea, bright red blood per rectum, melena, or hematemesis Neurologic:  No visual changes, wkns, changes in mental status. All other systems reviewed and are otherwise negative except as noted  above.  Physical Exam    Blood pressure 135/62, pulse 80, temperature 99.8 F (37.7 C), temperature source Oral, resp. rate 23, SpO2 96 %.  General: Pleasant, NAD Psych: Normal affect. Neuro: Alert and oriented X 3. Moves all extremities spontaneously. HEENT: Normal  Neck: Supple without bruits or JVD. Lungs:  Resp regular and unlabored, coarse breath sounds throughout with inspiratory and expiratory wheezing. Heart: Irregularly irregular, no s3, s4, or murmurs. Abdomen: Firm, protuberant, nontender, BS + x 4.  Extremities: No clubbing, cyanosis or edema. DP/PT/Radials 2+ and equal bilaterally.  Labs    Troponin [facility] Test)  Recent Labs  07/14/16 0241  TROPIPOC 0.57*    Recent Labs  07/14/16 0210 07/14/16 0715  TROPONINI 0.48* 2.69*   Lab Results  Component Value Date   WBC 13.8 (H) 07/14/2016   HGB 10.3 (L) 07/14/2016   HCT 32.1 (L) 07/14/2016   MCV 90.2 07/14/2016   PLT 188 07/14/2016    Recent Labs Lab 07/14/16 0210  NA 139  K 3.8  CL 103  CO2 21*  BUN 39*  CREATININE 1.21  CALCIUM 9.9  GLUCOSE 232*   Lab Results  Component Value Date   CHOL 134 05/18/2016   HDL 42.80 05/18/2016   LDLCALC UNABLE TO CALCULATE IF TRIGLYCERIDE OVER 400 mg/dL 12/31/2014   TRIG (H) 05/18/2016    952.0 Triglyceride is over 400; calculations on Lipids are invalid.     Radiology Studies    Dg Chest Portable 1 View  Result Date: 07/13/2016 CLINICAL DATA:  Shortness of breath. Diagnosed with bronchitis yesterday. Wheezing. Fall. EXAM: PORTABLE CHEST 1 VIEW COMPARISON:  Chest radiographs 08/14/2015 FINDINGS: Chronic hyperinflation and bronchial thickening. The heart is enlarged, stable from prior. Atherosclerosis of the thoracic aorta. Subsegmental left basilar atelectasis. No confluent consolidation, pulmonary edema, pleural effusion or pneumothorax. The bones are under mineralized, remote right clavicle fracture. Remote right rib fractures. IMPRESSION: Chronic  hyperinflation and bronchial thickening. Chronic cardiomegaly.  Thoracic aortic atherosclerosis. Electronically Signed   By: Jeb Levering M.D.   On: 07/13/2016 23:21  ECG & Cardiac Imaging    Atrial fibrillation, 141, IVCD, inferolateral ST depression.  Assessment & Plan    1. Acute respiratory failure with hypoxia/acute exacerbation of COPD: Patient presented with a four-day history of progressive dyspnea despite outpatient doxycycline therapy and initiation of oral steroids. He has been seen by internal medicine and pulmonology has also been consult at. IV steroids, inhalers, and empiric antibiotics have been initiated.  2. Coronary artery disease/elevated troponin: In the setting of above, patient has been noted to have an elevated troponin him a currently at 2.69. He has not been having any chest pain. He has had dyspnea in the setting of above. Plan to continue to cycle cardiac markers. We will hold his eliquis and place him on heparin for the time being. Follow-up echocardiogram. If echo shows normal LV function and troponin trend flattens, we would consider an inpatient versus outpatient noninvasive evaluation. Patient first to avoid diagnostic catheterization at all costs given prior history of right coronary artery dissection during catheterization. Continue statin therapy. He is not on beta blocker in the setting of advanced COPD.  3. Chronic atrial fibrillation: Rate was elevated in the setting of acute respiratory failure. Currently that are controlled on IV diltiazem. With management of respiratory illness, suspect rates will improve and oral verapamil can be resumed. In the setting of elevated troponin, will hold eliquis and place him on heparin for the time being, in case invasive evaluation is required.  4. Stage III chronic kidney disease: Creatinine mildly elevated. Follow.  5. Hyperlipidemia: Continue current statin and fiber regimen.  6. Normocytic anemia:  Stable.  Signed, [name], NP 07/14/2016, 12:47 PM  Patient seen with NP, agree with the above note.  He presents with what appears to be a COPD exacerbation with marked wheezing and dyspnea.  No chest pain except with cough.  Troponin elevated, initially in atrial fibrillation with RVR.  - Treat COPD exacerbation.  - Stop Eliquis and start heparin gtt.  Continue diltiazem gtt, rate control much better.  - Will trend troponin and check echo.  Patient wants to avoid cath if at all possible (h/o catheter-induced RCA dissection).  Will follow clinical course.   Loralie Champagne 07/14/2016 1:39 PM

## 2016-07-14 NOTE — Progress Notes (Signed)
Pharmacy Antibiotic Note  Eduardo [name] is a 83 y.o. male admitted on 07/13/2016 with pneumonia.  Pharmacy has been consulted for Vancomycin dosing. WBC elevated. CrCl ~45.   Plan: -Vancomycin 500 mg IV q12h -Levaquin 500 mg IV q24h per MD -Trend WBC, temp, renal function -Drug levels as indicated   Temp (24hrs), Avg:99.8 F (37.7 C), Min:99.8 F (37.7 C), Max:99.8 F (37.7 C)   Recent Labs Lab 07/13/16 2254 07/13/16 2313  WBC 16.6*  --   CREATININE 1.29* 1.10    Estimated Creatinine Clearance: 44.5 mL/min (by C-G formula based on SCr of 1.1 mg/dL).    Allergies  Allergen Reactions  . Penicillins Itching    Has patient had a PCN reaction causing immediate rash, facial/tongue/throat swelling, SOB or lightheadedness with hypotension: Yes Has patient had a PCN reaction causing severe rash involving mucus membranes or skin necrosis: No Has patient had a PCN reaction that required hospitalization No Has patient had a PCN reaction occurring within the last 10 years: No If all of the above answers are "NO", then may proceed with Cephalosporin use.   . Sulfonamide Derivatives Other (See Comments)    "it affected my kidneys"  . Tape Other (See Comments)    Arms black and blue, tears skin.  Please use "paper" tape    Narda Bonds 07/14/2016 1:58 AM

## 2016-07-14 NOTE — ED Notes (Signed)
Dr Tyrone Nine given a copy of lactic acid results 5.29

## 2016-07-14 NOTE — ED Notes (Signed)
Dr. Doyle Askew text paged to notify that troponin resulted at 2.69 and lactic acid resulted at 3.0. Waiting return phone call.

## 2016-07-14 NOTE — ED Notes (Signed)
Dr Tyrone Nine given a copy of troponin .[place]

## 2016-07-14 NOTE — Progress Notes (Signed)
ANTICOAGULATION CONSULT NOTE - Initial Consult  Pharmacy Consult for Heparin (pt on apixaban PTA) Indication: chest pain/ACS  Allergies  Allergen Reactions  . Penicillins Itching    Has patient had a PCN reaction causing immediate rash, facial/tongue/throat swelling, SOB or lightheadedness with hypotension: Yes Has patient had a PCN reaction causing severe rash involving mucus membranes or skin necrosis: No Has patient had a PCN reaction that required hospitalization No Has patient had a PCN reaction occurring within the last 10 years: No If all of the above answers are "NO", then may proceed with Cephalosporin use.   . Sulfonamide Derivatives Other (See Comments)    "it affected my kidneys"  . Tape Other (See Comments)    Arms black and blue, tears skin.  Please use "paper" tape    Patient Measurements:   Heparin Dosing Weight: ~70 kg  Vital Signs: BP: 135/62 (02/03 1143) Pulse Rate: 80 (02/03 1143)  Labs:  Recent Labs  07/13/16 2254 07/13/16 2313 07/14/16 0210 07/14/16 0715  HGB 11.2* 12.2* 10.3*  --   HCT 35.1* 36.0* 32.1*  --   PLT 220  --  188  --   CREATININE 1.29* 1.10 1.21  --   TROPONINI  --   --  0.48* 2.69*    Estimated Creatinine Clearance: 40.5 mL/min (by C-G formula based on SCr of 1.21 mg/dL).   Medical History: Past Medical History:  Diagnosis Date  . Allergy   . Anemia 2012, 08/2015   chronic. Acute due to large hematoma 2013. 2017: due to gastric AVM bleeding.   . Anxiety   . Atrial fibrillation ([place]) 2012   a.  CHA2DS2VASc = 6-->eliquis;  b. 12/2015 Echo: EF 60-65%, no rwma, LVH, mild AS/MS/MR, sev dil LA, mild TR, PASP 83mmHg.  Marland Kitchen CAD (coronary artery disease)    a. 06/2010 Cath: LM nl, LAD 50p/m, LCX 55m, RCA 50p/m -->catheter dissection but good flow, 70d, RPDA 50-70;  b. 06/2010 Relook cath due to bradycardia and inf ST elev: RCA dissection @ ostium extending into coronary cusp and prox RCA, Ao dissection-->less staining than previously-->Med  Rx.  Marland Kitchen COPD (chronic obstructive pulmonary disease) ([place]) 2013   exertional dyspnea  . Gastric angiodysplasia with hemorrhage 08/2015  . Hx of colonic polyps 2001, 2011   adenomatous 2001. HP 2001, 2011.  Marland Kitchen Hyperlipidemia   . Myocardial infarction 06/2010.  12/2011.   "twice; 1 day apart; during/after cath". NQMI in setting severe anemia 2013.   Marland Kitchen Neuropathy ([place]) 2014   in feet, likely due to spinal stenosis, DDD, HNP  . Osteoarthritis 2002   spinal stenosis, spondylolisthesis, disc protrusion multilevel in lumbar spine  . Stroke ([place]) 2016  . Upper GI bleed 08/2015   EGD: 2 small gastric AVMs, 1 actively bleeding.  Both treated with APC ablation, clipping of the bleeder    Assessment: 83 yo male with PMH s/f CAD, AFib on apixaban PTA (last dose documented as 2/2), h/o stroke, HLD, and COPD. Pt is presenting with dyspnea, cough, wheezing, and was noted to have elevated troponins. Pharmacy consulted to dose heparin for ACS, holding apixaban while on heparin.   Baseline Heparin level and APTT not known. Hgb 10.3, PLTC 188, no bleeding noted.     Goal of Therapy:  Heparin level 0.3-0.7 units/ml Monitor platelets by anticoagulation protocol: Yes   Plan:  No Heparin bolus as pt is on apixaban PTA.  Ordered baseline APTT and heparin level Start heparin infusion at 850 units/hr Check anti-Xa level in  8 hours and daily while on heparin Continue to monitor H&H and platelets  Carlean Jews, Pharm.D. PGY1 Pharmacy Resident 2/3/20181:24 PM Pager [phone]

## 2016-07-14 NOTE — Progress Notes (Signed)
Eduardo [name] for Heparin (pt on apixaban PTA) Indication: chest pain/ACS  Allergies  Allergen Reactions  . Penicillins Itching    Has patient had a PCN reaction causing immediate rash, facial/tongue/throat swelling, SOB or lightheadedness with hypotension: Yes Has patient had a PCN reaction causing severe rash involving mucus membranes or skin necrosis: No Has patient had a PCN reaction that required hospitalization No Has patient had a PCN reaction occurring within the last 10 years: No If all of the above answers are "NO", then may proceed with Cephalosporin use.   . Sulfonamide Derivatives Other (See Comments)    "it affected my kidneys"  . Tape Other (See Comments)    Arms black and blue, tears skin.  Please use "paper" tape    Patient Measurements: Height: 5\' 3"  (160 cm) Weight: 155 lb 13.8 oz (70.7 kg) IBW/kg (Calculated) : 56.9 Heparin Dosing Weight: ~70 kg  Vital Signs: Temp: 97.6 F (36.4 C) (02/03 1938) Temp Source: Oral (02/03 1938) BP: 154/84 (02/03 2213) Pulse Rate: 82 (02/03 2100)  Labs:  Recent Labs  07/13/16 2254 07/13/16 2313 07/14/16 0210 07/14/16 0715 07/14/16 1322 07/14/16 2117  HGB 11.2* 12.2* 10.3*  --   --   --   HCT 35.1* 36.0* 32.1*  --   --   --   PLT 220  --  188  --   --   --   APTT  --   --   --   --  42* 64*  HEPARINUNFRC  --   --   --   --  >2.20*  --   CREATININE 1.29* 1.10 1.21  --   --   --   TROPONINI  --   --  0.48* 2.69* 3.90*  --     Estimated Creatinine Clearance: 40.8 mL/min (by C-G formula based on SCr of 1.21 mg/dL).   Medical History: Past Medical History:  Diagnosis Date  . Allergy   . Anemia 2012, 08/2015   chronic. Acute due to large hematoma 2013. 2017: due to gastric AVM bleeding.   . Anxiety   . Atrial fibrillation ([place]) 2012   a.  CHA2DS2VASc = 6-->eliquis;  b. 12/2015 Echo: EF 60-65%, no rwma, LVH, mild AS/MS/MR, sev dil LA, mild TR, PASP 66mmHg.  Marland Kitchen CAD (coronary artery  disease)    a. 06/2010 Cath: LM nl, LAD 50p/m, LCX 50m, RCA 50p/m -->catheter dissection but good flow, 70d, RPDA 50-70;  b. 06/2010 Relook cath due to bradycardia and inf ST elev: RCA dissection @ ostium extending into coronary cusp and prox RCA, Ao dissection-->less staining than previously-->Med Rx.  Marland Kitchen COPD (chronic obstructive pulmonary disease) ([place]) 2013   exertional dyspnea  . Gastric angiodysplasia with hemorrhage 08/2015  . Hx of colonic polyps 2001, 2011   adenomatous 2001. HP 2001, 2011.  Marland Kitchen Hyperlipidemia   . Myocardial infarction 06/2010.  12/2011.   "twice; 1 day apart; during/after cath". NQMI in setting severe anemia 2013.   Marland Kitchen Neuropathy ([place]) 2014   in feet, likely due to spinal stenosis, DDD, HNP  . Osteoarthritis 2002   spinal stenosis, spondylolisthesis, disc protrusion multilevel in lumbar spine  . Stroke ([place]) 2016  . Upper GI bleed 08/2015   EGD: 2 small gastric AVMs, 1 actively bleeding.  Both treated with APC ablation, clipping of the bleeder    Assessment: 83 yo male with PMH s/f CAD, AFib on apixaban PTA (last dose documented as 2/2), h/o stroke, HLD, and COPD. Pt  is presenting with dyspnea, cough, wheezing, and was noted to have elevated troponins. Pharmacy consulted to dose heparin for ACS, holding apixaban while on heparin. Heparin level was > 2.2 today so will need to dose using aPTTs -aPTT= 62  Goal of Therapy:  APTT= 66-102 Heparin level 0.3-0.7 units/ml Monitor platelets by anticoagulation protocol: Yes   Plan:  -Increase heparin to 950 units/hr -aPTT in 8 hours and daily wth CBC daily -Heparin level daily  Hildred Laser, Pharm D 07/14/2016 10:29 PM

## 2016-07-14 NOTE — Progress Notes (Signed)
Pt seen and examined at bedside, please see earlier admission note by Dr. Tamala Julian. Pt admitted for evaluation and management of acute respiratory distress thought to be secondary to acute COPD with bronchitis. Noted elevated troponins as well. Cardiology and PCCM consulted, assistance appreciated. Pt will go to SDU when bed available.   Faye Ramsay, MD  [facility] Pager [phone]  If 7PM-7AM, please contact night-coverage [email or web address] Password [email or web address]

## 2016-07-14 NOTE — Progress Notes (Signed)
PHARMACY NOTE:  ANTIMICROBIAL RENAL DOSAGE ADJUSTMENT  Current antimicrobial regimen includes a mismatch between antimicrobial dosage and estimated renal function.  As per policy approved by the Pharmacy & Therapeutics and Medical Executive Committees, the antimicrobial dosage will be adjusted accordingly.  Current antimicrobial dosage:  Levaquin 500mg  IV Q24  Indication: Sepsis / possible PNA  Renal Function: Estimated Creatinine Clearance: 40.5 mL/min (by C-G formula based on SCr of 1.21 mg/dL).  Antimicrobial dosage has been changed to:  Levaquin 500mg  IV Q48  Thank you for allowing pharmacy to be a part of this patient's care.  Reginia Naas, [facility] 07/14/2016 10:07 AM

## 2016-07-14 NOTE — ED Notes (Signed)
Dr. Doyle Askew returned phone call. New orders received. Will continue to monitor.

## 2016-07-14 NOTE — Consult Note (Signed)
Name: Eduardo [name] MRN: [id number] DOB: [birth year]    ADMISSION DATE:  07/13/2016 CONSULTATION DATE:  2/3  REFERRING MD :  Doyle Askew   CHIEF COMPLAINT:  Acute on chronic respiratory failure   BRIEF PATIENT DESCRIPTION:  83 year old male w/ known h/o Chronic respiratory failure in setting of COPD (f/b [place]). Admitted 2/3 w/ acute bronchitis, AECOPD and resultant acute on chronic respiratory failure   SIGNIFICANT EVENTS    STUDIES:     HISTORY OF PRESENT ILLNESS:   This is a 83 year old male f/b [place] for COPD on chronic oxygen. Was in usual state of health until ~ 7d ago when developed cough. It was initially productive of grey sputum and began to be associated w/ increasing shortness of breath and wheezing so he actually went to our office on 2/1. At that time he was felt to have an AECOPD. He already had doxy for a rash he was being treated for. We instructed him to continue the doxy and he was prescribed a prednisone taper. He continued to worsen over the following days. The cough was almost persistent and he developed raspy voice w/ rhonchus upper airway noises. The pm hrs of 2/2 the shortness of breath progressed to resting dyspnea so he called EMS and reported to the ER. In the ER he was found to be in [place] w/ RVR, was in acute respiratory distress w/ marked accessory muscle use and required escalating oxygen to maintain sats > 90%. PCCM was asked to see re: acute on chronic respiratory failure  PAST MEDICAL HISTORY :   has a past medical history of Allergy; Anemia (2012, 08/2015); Anxiety; Atrial fibrillation ([place]) (2012); CAD (coronary artery disease); COPD (chronic obstructive pulmonary disease) ([place]) (2013); Gastric angiodysplasia with hemorrhage (08/2015); colonic polyps (2001, 2011); Hyperlipidemia; Myocardial infarction (06/2010.  12/2011.); Neuropathy ([place]) (2014); Osteoarthritis (2002); Stroke [facility]) (2016); and Upper GI bleed (08/2015).  has a past surgical history that includes  Appendectomy; Carpal tunnel release; Mastoidectomy; Pilonidal cyst / sinus excision; Knee arthroscopy; Shoulder arthroscopy; Foot fracture surgery; Nasal septum surgery; Penile prosthesis implant; Cataract extraction w/ intraocular lens implant (2/13); Carpal tunnel release (10/11/11); Tonsillectomy and adenoidectomy; Fracture surgery; Cardiac catheterization (2012); Penile prosthesis removal; Colonoscopy (2001, 02/2010); and Esophagogastroduodenoscopy (N/A, 08/15/2015). Prior to Admission medications   Medication Sig Start Date End Date Taking? Authorizing Provider  acetaminophen (TYLENOL) 650 MG CR tablet Take 1,300 mg by mouth 2 (two) times daily.   Yes Historical Provider, MD  albuterol (PROAIR HFA) 108 (90 BASE) MCG/ACT inhaler Inhale 2 puffs into the lungs every 6 (six) hours as needed for wheezing or shortness of breath. 12/08/14  Yes Collene Gobble, MD  apixaban (ELIQUIS) 5 MG TABS tablet Take 5 mg by mouth 2 (two) times daily.   Yes Historical Provider, MD  atorvastatin (LIPITOR) 20 MG tablet TAKE 1 TABLET BY MOUTH DAILY 02/01/16  Yes Belva Crome, MD  azelastine (ASTELIN) 0.1 % nasal spray Place 2 sprays into both nostrils at bedtime. Use in each nostril as directed   Yes Historical Provider, MD  B Complex-C (SUPER B COMPLEX PO) Take 1 tablet by mouth at bedtime.    Yes Historical Provider, MD  cetirizine (ZYRTEC) 10 MG tablet Take 10 mg by mouth at bedtime.    Yes Historical Provider, MD  cholecalciferol (VITAMIN D) 1000 units tablet Take 1,000 Units by mouth daily.   Yes Historical Provider, MD  doxycycline (VIBRA-TABS) 100 MG tablet Take 1 tablet (100 mg total)  by mouth 2 (two) times daily. Patient taking differently: Take 100 mg by mouth at bedtime.  07/12/16  Yes Tammy S Parrett, NP  escitalopram (LEXAPRO) 20 MG tablet Take 20 mg by mouth at bedtime.    Yes Historical Provider, MD  fenofibrate 160 MG tablet Take 1 tablet (160 mg total) by mouth at bedtime. 07/09/16  Yes Venia Carbon, MD    lamoTRIgine (LAMICTAL) 150 MG tablet Take 150 mg by mouth at bedtime. Reported on 08/14/2015 07/07/15  Yes Historical Provider, MD  Multiple Vitamin (MULTIVITAMIN) tablet Take 1 tablet by mouth at bedtime.    Yes Historical Provider, MD  pantoprazole (PROTONIX) 40 MG tablet TAKE 1 TABLET (40 MG TOTAL) BY MOUTH DAILY BEFORE BREAKFAST. 07/02/16  Yes Venia Carbon, MD  predniSONE (DELTASONE) 10 MG tablet 4 tabs for 2 days, then 3 tabs for 2 days, 2 tabs for 2 days, then 1 tab for 2 days, then stop 07/12/16  Yes Tammy S Parrett, NP  spironolactone (ALDACTONE) 25 MG tablet Take 25 mg by mouth 3 (three) times a week. (Mondays, Wednesdays, and Fridays)   Yes Historical Provider, MD  STIOLTO RESPIMAT 2.5-2.5 MCG/ACT AERS INHALE 2 PUFFS INTO THE LUNGS DAILY 06/13/16  Yes Tanda Rockers, MD  triamcinolone cream (KENALOG) 0.1 % Apply 1 application topically 2 (two) times daily as needed (for skin).  08/10/15  Yes Historical Provider, MD  verapamil (CALAN-SR) 240 MG CR tablet Take 1 tablet (240 mg total) by mouth at bedtime. 04/09/16  Yes Belva Crome, MD   Allergies  Allergen Reactions  . Penicillins Itching    Has patient had a PCN reaction causing immediate rash, facial/tongue/throat swelling, SOB or lightheadedness with hypotension: Yes Has patient had a PCN reaction causing severe rash involving mucus membranes or skin necrosis: No Has patient had a PCN reaction that required hospitalization No Has patient had a PCN reaction occurring within the last 10 years: No If all of the above answers are "NO", then may proceed with Cephalosporin use.   . Sulfonamide Derivatives Other (See Comments)    "it affected my kidneys"  . Tape Other (See Comments)    Arms black and blue, tears skin.  Please use "paper" tape    FAMILY HISTORY:  family history includes Cancer in his mother; Heart attack in his father. SOCIAL HISTORY:  reports that he quit smoking about 38 years ago. His smoking use included Cigarettes. He  has a 75.00 pack-year smoking history. He has never used smokeless tobacco. He reports that he does not drink alcohol or use drugs.  REVIEW OF SYSTEMS:   Constitutional: Negative for fever, chills, weight loss, + fatigue and diaphoresis.  HENT: Negative for hearing loss, ear pain, nosebleeds, congestion, sore throat, neck pain, tinnitus and ear discharge.   Eyes: Negative for blurred vision, double vision, photophobia, pain, discharge and redness.  Respiratory: + cough, hemoptysis, sputum production grey sputum, shortness of breath worse , wheezing not better w/ SABA and no stridor.   Cardiovascular: Negative for chest pain, palpitations, orthopnea, claudication, leg swelling and PND.  Gastrointestinal: Negative for heartburn, nausea, vomiting, abdominal pain, diarrhea, constipation, blood in stool and melena.  Genitourinary: Negative for dysuria, urgency, frequency, hematuria and flank pain.  Musculoskeletal: Negative for myalgias, back pain, joint pain and falls.  Skin: Negative for itching and rash.  Neurological: Negative for dizziness, tingling, tremors, sensory change, speech change, focal weakness, seizures, loss of consciousness, weakness and headaches.  Endo/Heme/Allergies: Negative for environmental allergies and  polydipsia. Does not bruise/bleed easily.  SUBJECTIVE:  Feels a little better  VITAL SIGNS: Temp:  [99.8 F (37.7 C)] 99.8 F (37.7 C) (02/02 2257) Pulse Rate:  [78-138] 80 (02/03 1143) Resp:  [17-26] 23 (02/03 1143) BP: (135-161)/(47-97) 135/62 (02/03 1143) SpO2:  [90 %-99 %] 96 % (02/03 1143) FiO2 (%):  [28 %] 28 % (02/03 0845)  PHYSICAL EXAMINATION: General:  83 year old white male, resting in bed. No acute distress when resting  Neuro:  Awake, alert, no focal def  HEENT:  NCAT, no JVD. Rhonchus upper airway noises and upper airway wheeze  Cardiovascular:  Tachy irreg irreg AF on tele  Lungs:  Diffuse wheeze and rhonchi  Abdomen:  Soft, not  tender Musculoskeletal:  Equal st and bulk  Skin:  Warm and dry   Recent Labs Lab 07/13/16 2254 07/13/16 2313 07/14/16 0210  NA 140 140 139  K 3.9 3.9 3.8  CL 101 104 103  CO2 24  --  21*  BUN 35* 36* 39*  CREATININE 1.29* 1.10 1.21  GLUCOSE 153* 152* 232*    Recent Labs Lab 07/13/16 2254 07/14/16 0210 07/14/16 0244 07/14/16 0715  WBC 16.6* 13.8*  --   --   LATICACIDVEN  --   --  5.29* 3.0*      Recent Labs Lab 07/13/16 2254 07/13/16 2313 07/14/16 0210  HGB 11.2* 12.2* 10.3*  HCT 35.1* 36.0* 32.1*  WBC 16.6*  --  13.8*  PLT 220  --  188   Dg Chest Portable 1 View  Result Date: 07/13/2016 CLINICAL DATA:  Shortness of breath. Diagnosed with bronchitis yesterday. Wheezing. Fall. EXAM: PORTABLE CHEST 1 VIEW COMPARISON:  Chest radiographs 08/14/2015 FINDINGS: Chronic hyperinflation and bronchial thickening. The heart is enlarged, stable from prior. Atherosclerosis of the thoracic aorta. Subsegmental left basilar atelectasis. No confluent consolidation, pulmonary edema, pleural effusion or pneumothorax. The bones are under mineralized, remote right clavicle fracture. Remote right rib fractures. IMPRESSION: Chronic hyperinflation and bronchial thickening. Chronic cardiomegaly.  Thoracic aortic atherosclerosis. Electronically Signed   By: Jeb Levering M.D.   On: 07/13/2016 23:21    ASSESSMENT / PLAN:  Acute on chronic hypoxic respiratory failure in setting of  Acute bronchitis and AECOPD  -d-dx includes slow to resolve viral process OR bacterial superinfection. Has a significant upper airway component Plan Scheduled BDs Wean O2 BIPAP PRN Systemic steroids Agree w/ empiric levaquin (would rx x5d) Ck resp viral panel Add tussionex Add nasal steroids  Increase PPI to BID   Sirs/sepsis LA cleared. Some of the lactic acidosis was d/t respiratory failure > shock state.  Plan No further lactic acid draws Admit to tele Cont abx   Af w/ RVR; Chadsvasc:  mild  troponin rise  Known CAD Plan Tele recs per cards    ckd stage III Plan F/u am chemistry   Hyperglycemia  Plan ssi   Erick Colace ACNP-BC [place] Pager # [phone] OR # [phone] if no answer  07/14/2016, 12:49 PM   ATTENDING NOTE / ATTESTATION NOTE :   I have discussed the case with the resident/APP  Marni Griffon  I agree with the resident/APP's  history, physical examination, assessment, and plans.    I have edited the above note and modified it according to our agreed history, physical examination, assessment and plan.   Briefly, 83 year old male w/ known h/o Chronic respiratory failure in setting of COPD (f/b [place]). Admitted 2/3 w/ acute bronchitis, AECOPD and resultant acute on  chronic respiratory failure. He had recent cough, wheezing, prompting visit to his pulmonologist. He was given antibiotic and prednisone. He initially improved but worse in the last couple of days.  Patient was treated as acute exacerbation of COPD and possible healthcare associated pneumonia. He was noted to be in rapid atrial fib.  Troponin has also been climbing up.  Patient was tried on BiPAP. He has been taken off BiPAP for rest and appears comfortable on nasal cannula. When I saw him, his breathing is better, denies any chest pain.  Vitals:  Vitals:   07/14/16 1443 07/14/16 1500 07/14/16 1538 07/14/16 1600  BP: (!) 181/79 116/81  (!) 147/63  Pulse: 86 67  78  Resp: (!) 26 (!) 26  16  Temp: 98.5 F (36.9 C) 98.5 F (36.9 C)  98.4 F (36.9 C)  TempSrc: Oral Oral  Oral  SpO2: 92% 94% 93% 95%  Weight: 70.7 kg (155 lb 13.8 oz)     Height: 5\' 3"  (1.6 m)       Constitutional/General: well-nourished, well-developed, in mild resp distress.   Body mass index is 27.61 kg/m. Wt Readings from Last 3 Encounters:  07/14/16 70.7 kg (155 lb 13.8 oz)  07/12/16 69.4 kg (153 lb)  05/29/16 70.3 kg (155 lb)    HEENT: PERLA, anicteric sclerae. (-) Oral thrush.   Neck: No  masses. Midline trachea. No JVD, (-) LAD. (-) bruits appreciated.  Respiratory/Chest: Grossly normal chest. (-) deformity. Occasional Accessory muscle use.  Symmetric expansion. Diminished BS on both lower lung zones. Rhonchi in [place]. Occasional wheezing.  (-) egophony  Cardiovascular: variable s1, heart sounds normal, no murmur or gallops,  Trace peripheral edema  Gastrointestinal:  Normal bowel sounds. Soft, non-tender. No hepatosplenomegaly.  (-) masses.   Musculoskeletal:  Normal muscle tone.   Extremities: Grossly normal. (-) clubbing, cyanosis.  (-) edema  Skin: (-) rash,lesions seen.   Neurological/Psychiatric : sedated, intubated. CN grossly intact. (-) lateralizing signs.   CBC Recent Labs     07/13/16  2254  07/13/16  2313  07/14/16  0210  WBC  16.6*   --   13.8*  HGB  11.2*  12.2*  10.3*  HCT  35.1*  36.0*  32.1*  PLT  220   --   188    Coag's Recent Labs     07/14/16  1322  APTT  42*    BMET Recent Labs     07/13/16  2254  07/13/16  2313  07/14/16  0210  NA  140  140  139  K  3.9  3.9  3.8  CL  101  104  103  CO2  24   --   21*  BUN  35*  36*  39*  CREATININE  1.29*  1.10  1.21  GLUCOSE  153*  152*  232*    Electrolytes Recent Labs     07/13/16  2254  07/14/16  0210  CALCIUM  10.3  9.9    Sepsis Markers No results for input(s): PROCALCITON, O2SATVEN in the last 72 hours.  Invalid input(s): LACTICACIDVEN  ABG Recent Labs     07/14/16  1135  PHART  7.403  PCO2ART  33.5  PO2ART  102.0    Liver Enzymes No results for input(s): AST, ALT, ALKPHOS, BILITOT, ALBUMIN in the last 72 hours.  Cardiac Enzymes Recent Labs     07/14/16  0210  07/14/16  0715  07/14/16  1322  TROPONINI  0.48*  2.69*  3.90*    Glucose Recent Labs     07/14/16  0808  07/14/16  1222  07/14/16  1705  GLUCAP  93  153*  135*    Imaging Dg Chest Portable 1 View  Result Date: 07/13/2016 CLINICAL DATA:  Shortness of breath. Diagnosed with  bronchitis yesterday. Wheezing. Fall. EXAM: PORTABLE CHEST 1 VIEW COMPARISON:  Chest radiographs 08/14/2015 FINDINGS: Chronic hyperinflation and bronchial thickening. The heart is enlarged, stable from prior. Atherosclerosis of the thoracic aorta. Subsegmental left basilar atelectasis. No confluent consolidation, pulmonary edema, pleural effusion or pneumothorax. The bones are under mineralized, remote right clavicle fracture. Remote right rib fractures. IMPRESSION: Chronic hyperinflation and bronchial thickening. Chronic cardiomegaly.  Thoracic aortic atherosclerosis. Electronically Signed   By: Jeb Levering M.D.   On: 07/13/2016 23:21   Assessment/plan : 1. Acute on chronic hypoxemic respiratory failure secondary to acute exacerbation of COPD + Bronchitis + Demand ischemia - Pt is a partial DNR. No intubation, no CPR. May use pressors.  - Bipap prn for resp distress. Suggest may try on and off and at HS to help with copd and demand ischemia if he tolerates it.  - Keep o2 sats > 88%. Currently on 3L HC [place].  - cont brovana, budesonide, xopenex, atrovent - cont IV steroids - on vanc and levaquin > try to deescalate once with cultures.   2. Demand ischemia. Atiral fibrillation - Bipap prn. - on heparin drip - try to keep even   3. AKI - keep euvolemic to even   Plan to transfer pt to SDU. PCCM will follow.   I spent  30  minutes of Critical Care time with this patient today. This is my time spent independent of the APP or resident.   Family :Family updated at length today.  Son updated at bedside.    Monica Becton, MD 07/14/2016, 6:24 PM [facility] Pager [phone] After 3 pm or if no answer, call [phone]

## 2016-07-14 NOTE — ED Notes (Signed)
I will draw lactic acid when collect troponin..per orders nurse

## 2016-07-15 ENCOUNTER — Inpatient Hospital Stay (HOSPITAL_COMMUNITY): Payer: PPO

## 2016-07-15 DIAGNOSIS — R0600 Dyspnea, unspecified: Secondary | ICD-10-CM

## 2016-07-15 DIAGNOSIS — R748 Abnormal levels of other serum enzymes: Secondary | ICD-10-CM

## 2016-07-15 DIAGNOSIS — E785 Hyperlipidemia, unspecified: Secondary | ICD-10-CM

## 2016-07-15 LAB — ECHOCARDIOGRAM COMPLETE
AO mean calculated velocity dopler: 146 cm/s
AV Area mean vel: 1.49 cm2
AV Mean grad: 10 mmHg
AV Peak grad: 19 mmHg
AV VEL mean LVOT/AV: 0.47
AV pk vel: 216 cm/s
AV vel: 1.5
AVA: 1.5 cm2
AVAREAMEANVIN: 0.86 cm2/m2
AVAREAVTI: 1.6 cm2
AVAREAVTIIND: 0.86 cm2/m2
Ao pk vel: 0.51 m/s
CHL CUP AV PEAK INDEX: 0.92
CHL CUP AV VALUE AREA INDEX: 0.86
CHL CUP DOP CALC LVOT VTI: 21.8 cm
FS: 21 % — AB (ref 28–44)
HEIGHTINCHES: 63 in
IV/PV OW: 0.77
LA ID, A-P, ES: 54 mm
LA diam index: 3.1 cm/m2
LA vol A4C: 84 ml
LA vol: 89.2 mL
LAVOLIN: 51.3 mL/m2
LDCA: 3.14 cm2
LEFT ATRIUM END SYS DIAM: 54 mm
LV PW d: 11.7 mm — AB (ref 0.6–1.1)
LVOT SV: 68 mL
LVOT diameter: 20 mm
LVOT peak VTI: 0.48 cm
LVOTPV: 110 cm/s
Lateral S' vel: 10.2 cm/s
TAPSE: 18 mm
VTI: 45.7 cm
WEIGHTICAEL: 2493.84 [oz_av]

## 2016-07-15 LAB — CBC
HEMATOCRIT: 31.4 % — AB (ref 39.0–52.0)
HEMOGLOBIN: 10.1 g/dL — AB (ref 13.0–17.0)
MCH: 29 pg (ref 26.0–34.0)
MCHC: 32.2 g/dL (ref 30.0–36.0)
MCV: 90.2 fL (ref 78.0–100.0)
PLATELETS: 181 10*3/uL (ref 150–400)
RBC: 3.48 MIL/uL — AB (ref 4.22–5.81)
RDW: 14.8 % (ref 11.5–15.5)
WBC: 12.2 10*3/uL — AB (ref 4.0–10.5)

## 2016-07-15 LAB — GLUCOSE, CAPILLARY
Glucose-Capillary: 139 mg/dL — ABNORMAL HIGH (ref 65–99)
Glucose-Capillary: 178 mg/dL — ABNORMAL HIGH (ref 65–99)
Glucose-Capillary: 150 mg/dL — ABNORMAL HIGH (ref 65–99)
Glucose-Capillary: 155 mg/dL — ABNORMAL HIGH (ref 65–99)

## 2016-07-15 LAB — BASIC METABOLIC PANEL
Anion gap: 12 (ref 5–15)
BUN: 49 mg/dL — ABNORMAL HIGH (ref 6–20)
CALCIUM: 10 mg/dL (ref 8.9–10.3)
CHLORIDE: 102 mmol/L (ref 101–111)
CO2: 21 mmol/L — AB (ref 22–32)
Creatinine, Ser: 1.15 mg/dL (ref 0.61–1.24)
GFR calc non Af Amer: 57 mL/min — ABNORMAL LOW (ref >60)
Glucose, Bld: 148 mg/dL — ABNORMAL HIGH (ref 65–99)
POTASSIUM: 5.3 mmol/L — AB (ref 3.5–5.1)
Sodium: 135 mmol/L (ref 135–145)

## 2016-07-15 LAB — RESPIRATORY PANEL BY PCR
ADENOVIRUS-RVPPCR: NOT DETECTED
BORDETELLA PERTUSSIS-RVPCR: NOT DETECTED
CORONAVIRUS 229E-RVPPCR: NOT DETECTED
CORONAVIRUS HKU1-RVPPCR: NOT DETECTED
Chlamydophila pneumoniae: NOT DETECTED
Coronavirus NL63: NOT DETECTED
Coronavirus OC43: NOT DETECTED
Influenza A: NOT DETECTED
Influenza B: NOT DETECTED
METAPNEUMOVIRUS-RVPPCR: NOT DETECTED
Mycoplasma pneumoniae: NOT DETECTED
PARAINFLUENZA VIRUS 2-RVPPCR: NOT DETECTED
Parainfluenza Virus 1: NOT DETECTED
Parainfluenza Virus 3: NOT DETECTED
Parainfluenza Virus 4: NOT DETECTED
Respiratory Syncytial Virus: DETECTED — AB
Rhinovirus / Enterovirus: NOT DETECTED

## 2016-07-15 LAB — APTT
aPTT: >200 seconds (ref 24–36)
aPTT: 72 seconds — ABNORMAL HIGH (ref 24–36)

## 2016-07-15 LAB — STREP PNEUMONIAE URINARY ANTIGEN: STREP PNEUMO URINARY ANTIGEN: NEGATIVE

## 2016-07-15 MED ORDER — GUAIFENESIN-DM 100-10 MG/5ML PO SYRP: 5.0000 mL | ORAL_SOLUTION | ORAL | Status: DC | PRN

## 2016-07-15 MED ORDER — METHYLPREDNISOLONE SODIUM SUCC 125 MG IJ SOLR
60.0000 mg | Freq: Four times a day (QID) | INTRAMUSCULAR | Status: DC
  Administered 2016-07-15 – 2016-07-16 (×4): 60 mg via INTRAVENOUS
  Filled 2016-07-15 (×4): qty 2

## 2016-07-15 MED ORDER — ASPIRIN 81 MG PO CHEW
81.0000 mg | CHEWABLE_TABLET | Freq: Every day | ORAL | Status: DC
  Administered 2016-07-16 – 2016-07-20 (×5): 81 mg via ORAL
  Filled 2016-07-15 (×5): qty 1

## 2016-07-15 MED ORDER — ATORVASTATIN CALCIUM 80 MG PO TABS
80.0000 mg | ORAL_TABLET | Freq: Every day | ORAL | Status: DC
  Administered 2016-07-15 – 2016-07-19 (×5): 80 mg via ORAL
  Filled 2016-07-15 (×5): qty 1

## 2016-07-15 MED ORDER — ASPIRIN 81 MG PO CHEW
324.0000 mg | CHEWABLE_TABLET | Freq: Once | ORAL | Status: AC
  Administered 2016-07-15: 324 mg via ORAL
  Filled 2016-07-15: qty 4

## 2016-07-15 NOTE — Progress Notes (Signed)
DAILY PROGRESS NOTE  Subjective:  No chest pain. Still significant dyspnea. In rate-controlled a-fib. Troponin elevated to 3.5. Family is very concerned about cath given prior history of spontaneous RCA dissection.  Objective:  Temp:  [97.6 F (36.4 C)-98.5 F (36.9 C)] 97.7 F (36.5 C) (02/04 0817) Pulse Rate:  [60-91] 66 (02/04 0817) Resp:  [14-28] 19 (02/04 0817) BP: (116-181)/(62-103) 142/83 (02/04 0817) SpO2:  [91 %-99 %] 95 % (02/04 0823) Weight:  [155 lb 13.8 oz (70.7 kg)] 155 lb 13.8 oz (70.7 kg) (02/03 1443) Weight change:   Intake/Output from previous day: 02/03 0701 - 02/04 0700 In: 1471.6 [P.O.:120; I.V.:251.6; IV Piggyback:1100] Out: 850 [Urine:850]  Intake/Output from this shift: No intake/output data recorded.  Medications: No current facility-administered medications on file prior to encounter.    Current Outpatient Prescriptions on File Prior to Encounter  Medication Sig Dispense Refill  . acetaminophen (TYLENOL) 650 MG CR tablet Take 1,300 mg by mouth 2 (two) times daily.    Marland Kitchen albuterol (PROAIR HFA) 108 (90 BASE) MCG/ACT inhaler Inhale 2 puffs into the lungs every 6 (six) hours as needed for wheezing or shortness of breath. 3 Inhaler 3  . apixaban (ELIQUIS) 5 MG TABS tablet Take 5 mg by mouth 2 (two) times daily.    Marland Kitchen atorvastatin (LIPITOR) 20 MG tablet TAKE 1 TABLET BY MOUTH DAILY 90 tablet 3  . azelastine (ASTELIN) 0.1 % nasal spray Place 2 sprays into both nostrils at bedtime. Use in each nostril as directed    . B Complex-C (SUPER B COMPLEX PO) Take 1 tablet by mouth at bedtime.     . cetirizine (ZYRTEC) 10 MG tablet Take 10 mg by mouth at bedtime.     . cholecalciferol (VITAMIN D) 1000 units tablet Take 1,000 Units by mouth daily.    Marland Kitchen doxycycline (VIBRA-TABS) 100 MG tablet Take 1 tablet (100 mg total) by mouth 2 (two) times daily. (Patient taking differently: Take 100 mg by mouth at bedtime. ) 8 tablet 0  . escitalopram (LEXAPRO) 20 MG tablet Take  20 mg by mouth at bedtime.     . fenofibrate 160 MG tablet Take 1 tablet (160 mg total) by mouth at bedtime. 90 tablet 3  . lamoTRIgine (LAMICTAL) 150 MG tablet Take 150 mg by mouth at bedtime. Reported on 08/14/2015    . Multiple Vitamin (MULTIVITAMIN) tablet Take 1 tablet by mouth at bedtime.     . pantoprazole (PROTONIX) 40 MG tablet TAKE 1 TABLET (40 MG TOTAL) BY MOUTH DAILY BEFORE BREAKFAST. 90 tablet 3  . predniSONE (DELTASONE) 10 MG tablet 4 tabs for 2 days, then 3 tabs for 2 days, 2 tabs for 2 days, then 1 tab for 2 days, then stop 20 tablet 0  . spironolactone (ALDACTONE) 25 MG tablet Take 25 mg by mouth 3 (three) times a week. (Mondays, Wednesdays, and Fridays)    . STIOLTO RESPIMAT 2.5-2.5 MCG/ACT AERS INHALE 2 PUFFS INTO THE LUNGS DAILY 12 Inhaler 3  . triamcinolone cream (KENALOG) 0.1 % Apply 1 application topically 2 (two) times daily as needed (for skin).     . verapamil (CALAN-SR) 240 MG CR tablet Take 1 tablet (240 mg total) by mouth at bedtime. 90 tablet 1    Physical Exam: General appearance: alert and mild distress Lungs: rales bilaterally, rhonchi bilaterally and wheezes bilaterally Heart: irregularly irregular rhythm Extremities: extremities normal, atraumatic, no cyanosis or edema Neurologic: Grossly normal  Lab Results: Results for orders placed or performed during  the hospital encounter of 07/13/16 (from the past 48 hour(s))  CBC     Status: Abnormal   Collection Time: 07/13/16 10:54 PM  Result Value Ref Range   WBC 16.6 (H) 4.0 - 10.5 K/uL   RBC 3.89 (L) 4.22 - 5.81 MIL/uL   Hemoglobin 11.2 (L) 13.0 - 17.0 g/dL   HCT 35.1 (L) 39.0 - 52.0 %   MCV 90.2 78.0 - 100.0 fL   MCH 28.8 26.0 - 34.0 pg   MCHC 31.9 30.0 - 36.0 g/dL   RDW 14.4 11.5 - 15.5 %   Platelets 220 150 - 400 K/uL  Basic metabolic panel     Status: Abnormal   Collection Time: 07/13/16 10:54 PM  Result Value Ref Range   Sodium 140 135 - 145 mmol/L   Potassium 3.9 3.5 - 5.1 mmol/L   Chloride 101  101 - 111 mmol/L   CO2 24 22 - 32 mmol/L   Glucose, Bld 153 (H) 65 - 99 mg/dL   BUN 35 (H) 6 - 20 mg/dL   Creatinine, Ser 1.29 (H) 0.61 - 1.24 mg/dL   Calcium 10.3 8.9 - 10.3 mg/dL   GFR calc non Af Amer 50 (L) >60 mL/min   GFR calc Af Amer 57 (L) >60 mL/min    Comment: (NOTE) The eGFR has been calculated using the CKD EPI equation. This calculation has not been validated in all clinical situations. eGFR's persistently <60 mL/min signify possible Chronic Kidney Disease.    Anion gap 15 5 - 15  Brain natriuretic peptide     Status: Abnormal   Collection Time: 07/13/16 11:03 PM  Result Value Ref Range   B Natriuretic Peptide 261.9 (H) 0.0 - 100.0 pg/mL  I-Stat Troponin, ED - 0, 3, 6 hours (not at [facility])     Status: None   Collection Time: 07/13/16 11:11 PM  Result Value Ref Range   Troponin i, poc 0.00 0.00 - 0.08 ng/mL   Comment 3            Comment: Due to the release kinetics of cTnI, a negative result within the first hours of the onset of symptoms does not rule out myocardial infarction with certainty. If myocardial infarction is still suspected, repeat the test at appropriate intervals.   I-Stat Chem 8, ED     Status: Abnormal   Collection Time: 07/13/16 11:13 PM  Result Value Ref Range   Sodium 140 135 - 145 mmol/L   Potassium 3.9 3.5 - 5.1 mmol/L   Chloride 104 101 - 111 mmol/L   BUN 36 (H) 6 - 20 mg/dL   Creatinine, Ser 1.10 0.61 - 1.24 mg/dL   Glucose, Bld 152 (H) 65 - 99 mg/dL   Calcium, Ion 1.31 1.15 - 1.40 mmol/L   TCO2 24 0 - 100 mmol/L   Hemoglobin 12.2 (L) 13.0 - 17.0 g/dL   HCT 36.0 (L) 39.0 - 52.0 %  Influenza panel by PCR (type A & B)     Status: None   Collection Time: 07/14/16 12:09 AM  Result Value Ref Range   Influenza A By PCR NEGATIVE NEGATIVE   Influenza B By PCR NEGATIVE NEGATIVE    Comment: (NOTE) The Xpert Xpress Flu assay is intended as an aid in the diagnosis of  influenza and should not be used as a sole basis for treatment.  This  assay  is FDA approved for nasopharyngeal swab specimens only. Nasal  washings and aspirates are unacceptable for Xpert Xpress Flu  testing.   Respiratory Panel by PCR     Status: Abnormal   Collection Time: 07/14/16 12:09 AM  Result Value Ref Range   Adenovirus NOT DETECTED NOT DETECTED   Coronavirus 229E NOT DETECTED NOT DETECTED   Coronavirus HKU1 NOT DETECTED NOT DETECTED   Coronavirus NL63 NOT DETECTED NOT DETECTED   Coronavirus OC43 NOT DETECTED NOT DETECTED   Metapneumovirus NOT DETECTED NOT DETECTED   Rhinovirus / Enterovirus NOT DETECTED NOT DETECTED   Influenza A NOT DETECTED NOT DETECTED   Influenza B NOT DETECTED NOT DETECTED   Parainfluenza Virus 1 NOT DETECTED NOT DETECTED   Parainfluenza Virus 2 NOT DETECTED NOT DETECTED   Parainfluenza Virus 3 NOT DETECTED NOT DETECTED   Parainfluenza Virus 4 NOT DETECTED NOT DETECTED   Respiratory Syncytial Virus DETECTED (A) NOT DETECTED    Comment: CRITICAL RESULT CALLED TO, READ BACK BY AND VERIFIED WITH: Adora Fridge AT [phone] 07/15/16 BY L BENFIELD    Bordetella pertussis NOT DETECTED NOT DETECTED   Chlamydophila pneumoniae NOT DETECTED NOT DETECTED   Mycoplasma pneumoniae NOT DETECTED NOT DETECTED  CBC     Status: Abnormal   Collection Time: 07/14/16  2:10 AM  Result Value Ref Range   WBC 13.8 (H) 4.0 - 10.5 K/uL   RBC 3.56 (L) 4.22 - 5.81 MIL/uL   Hemoglobin 10.3 (L) 13.0 - 17.0 g/dL   HCT 32.1 (L) 39.0 - 52.0 %   MCV 90.2 78.0 - 100.0 fL   MCH 28.9 26.0 - 34.0 pg   MCHC 32.1 30.0 - 36.0 g/dL   RDW 14.4 11.5 - 15.5 %   Platelets 188 150 - 400 K/uL  Basic metabolic panel     Status: Abnormal   Collection Time: 07/14/16  2:10 AM  Result Value Ref Range   Sodium 139 135 - 145 mmol/L   Potassium 3.8 3.5 - 5.1 mmol/L   Chloride 103 101 - 111 mmol/L   CO2 21 (L) 22 - 32 mmol/L   Glucose, Bld 232 (H) 65 - 99 mg/dL   BUN 39 (H) 6 - 20 mg/dL   Creatinine, Ser 1.21 0.61 - 1.24 mg/dL   Calcium 9.9 8.9 - 10.3 mg/dL   GFR calc non Af  Amer 54 (L) >60 mL/min   GFR calc Af Amer >60 >60 mL/min    Comment: (NOTE) The eGFR has been calculated using the CKD EPI equation. This calculation has not been validated in all clinical situations. eGFR's persistently <60 mL/min signify possible Chronic Kidney Disease.    Anion gap 15 5 - 15  Troponin I     Status: Abnormal   Collection Time: 07/14/16  2:10 AM  Result Value Ref Range   Troponin I 0.48 (HH) <0.03 ng/mL    Comment: CRITICAL RESULT CALLED TO, READ BACK BY AND VERIFIED WITH: HILL J,RN 07/14/16 0316 WAYK   I-Stat Troponin, ED - 0, 3, 6 hours (not at [facility])     Status: Abnormal   Collection Time: 07/14/16  2:41 AM  Result Value Ref Range   Troponin i, poc 0.57 (HH) 0.00 - 0.08 ng/mL   Comment NOTIFIED PHYSICIAN    Comment 3            Comment: Due to the release kinetics of cTnI, a negative result within the first hours of the onset of symptoms does not rule out myocardial infarction with certainty. If myocardial infarction is still suspected, repeat the test at appropriate intervals.   I-Stat CG4 Lactic Acid,  ED     Status: Abnormal   Collection Time: 07/14/16  2:44 AM  Result Value Ref Range   Lactic Acid, Venous 5.29 (HH) 0.5 - 1.9 mmol/L   Comment NOTIFIED PHYSICIAN   Troponin I     Status: Abnormal   Collection Time: 07/14/16  7:15 AM  Result Value Ref Range   Troponin I 2.69 (HH) <0.03 ng/mL    Comment: CRITICAL VALUE NOTED.  VALUE IS CONSISTENT WITH PREVIOUSLY REPORTED AND CALLED VALUE.  Lactic acid, plasma     Status: Abnormal   Collection Time: 07/14/16  7:15 AM  Result Value Ref Range   Lactic Acid, Venous 3.0 (HH) 0.5 - 1.9 mmol/L    Comment: CRITICAL RESULT CALLED TO, READ BACK BY AND VERIFIED WITH: W.HICKS RN @ [phone] 07/14/16 BY C.EDENS   CBG monitoring, ED     Status: None   Collection Time: 07/14/16  8:08 AM  Result Value Ref Range   Glucose-Capillary 93 65 - 99 mg/dL  I-Stat arterial blood gas, ED     Status: Abnormal   Collection Time:  07/14/16 11:35 AM  Result Value Ref Range   pH, Arterial 7.403 7.350 - 7.450   pCO2 arterial 33.5 32.0 - 48.0 mmHg   pO2, Arterial 102.0 83.0 - 108.0 mmHg   Bicarbonate 20.9 20.0 - 28.0 mmol/L   TCO2 22 0 - 100 mmol/L   O2 Saturation 98.0 %   Acid-base deficit 3.0 (H) 0.0 - 2.0 mmol/L   Patient temperature 98.6 F    Collection site RADIAL, ALLEN'S TEST ACCEPTABLE    Drawn by Operator    Sample type ARTERIAL   CBG monitoring, ED     Status: Abnormal   Collection Time: 07/14/16 12:22 PM  Result Value Ref Range   Glucose-Capillary 153 (H) 65 - 99 mg/dL  Troponin I     Status: Abnormal   Collection Time: 07/14/16  1:22 PM  Result Value Ref Range   Troponin I 3.90 (HH) <0.03 ng/mL    Comment: CRITICAL VALUE NOTED.  VALUE IS CONSISTENT WITH PREVIOUSLY REPORTED AND CALLED VALUE.  Lactic acid, plasma     Status: None   Collection Time: 07/14/16  1:22 PM  Result Value Ref Range   Lactic Acid, Venous 1.5 0.5 - 1.9 mmol/L  APTT     Status: Abnormal   Collection Time: 07/14/16  1:22 PM  Result Value Ref Range   aPTT 42 (H) 24 - 36 seconds    Comment:        IF BASELINE aPTT IS ELEVATED, SUGGEST PATIENT RISK ASSESSMENT BE USED TO DETERMINE APPROPRIATE ANTICOAGULANT THERAPY.   Heparin level (unfractionated)     Status: Abnormal   Collection Time: 07/14/16  1:22 PM  Result Value Ref Range   Heparin Unfractionated >2.20 (H) 0.30 - 0.70 IU/mL    Comment: RESULTS CONFIRMED BY MANUAL DILUTION        IF HEPARIN RESULTS ARE BELOW EXPECTED VALUES, AND PATIENT DOSAGE HAS BEEN CONFIRMED, SUGGEST FOLLOW UP TESTING OF ANTITHROMBIN III LEVELS.   MRSA PCR Screening     Status: None   Collection Time: 07/14/16  2:37 PM  Result Value Ref Range   MRSA by PCR NEGATIVE NEGATIVE    Comment:        The GeneXpert MRSA Assay (FDA approved for NASAL specimens only), is one component of a comprehensive MRSA colonization surveillance program. It is not intended to diagnose MRSA infection nor to  guide or monitor treatment for MRSA  infections.   Glucose, capillary     Status: Abnormal   Collection Time: 07/14/16  5:05 PM  Result Value Ref Range   Glucose-Capillary 135 (H) 65 - 99 mg/dL  Heparin level (unfractionated)     Status: Abnormal   Collection Time: 07/14/16  9:17 PM  Result Value Ref Range   Heparin Unfractionated >2.20 (H) 0.30 - 0.70 IU/mL    Comment: RESULTS CONFIRMED BY MANUAL DILUTION        IF HEPARIN RESULTS ARE BELOW EXPECTED VALUES, AND PATIENT DOSAGE HAS BEEN CONFIRMED, SUGGEST FOLLOW UP TESTING OF ANTITHROMBIN III LEVELS.   APTT     Status: Abnormal   Collection Time: 07/14/16  9:17 PM  Result Value Ref Range   aPTT 64 (H) 24 - 36 seconds    Comment:        IF BASELINE aPTT IS ELEVATED, SUGGEST PATIENT RISK ASSESSMENT BE USED TO DETERMINE APPROPRIATE ANTICOAGULANT THERAPY.   Glucose, capillary     Status: Abnormal   Collection Time: 07/14/16 10:10 PM  Result Value Ref Range   Glucose-Capillary 156 (H) 65 - 99 mg/dL  Basic metabolic panel     Status: Abnormal   Collection Time: 07/15/16  6:36 AM  Result Value Ref Range   Sodium 135 135 - 145 mmol/L   Potassium 5.3 (H) 3.5 - 5.1 mmol/L    Comment: HEMOLYSIS AT THIS LEVEL MAY AFFECT RESULT   Chloride 102 101 - 111 mmol/L   CO2 21 (L) 22 - 32 mmol/L   Glucose, Bld 148 (H) 65 - 99 mg/dL   BUN 49 (H) 6 - 20 mg/dL   Creatinine, Ser 1.15 0.61 - 1.24 mg/dL   Calcium 10.0 8.9 - 10.3 mg/dL   GFR calc non Af Amer 57 (L) >60 mL/min   GFR calc Af Amer >60 >60 mL/min    Comment: (NOTE) The eGFR has been calculated using the CKD EPI equation. This calculation has not been validated in all clinical situations. eGFR's persistently <60 mL/min signify possible Chronic Kidney Disease.    Anion gap 12 5 - 15  APTT     Status: Abnormal   Collection Time: 07/15/16  6:36 AM  Result Value Ref Range   aPTT >200 (HH) 24 - 36 seconds    Comment:        IF BASELINE aPTT IS ELEVATED, SUGGEST PATIENT RISK  ASSESSMENT BE USED TO DETERMINE APPROPRIATE ANTICOAGULANT THERAPY. REPEATED TO VERIFY CRITICAL RESULT CALLED TO, READ BACK BY AND VERIFIED WITH: Adora Fridge 07/15/16 0837 RHOLMES   CBC     Status: Abnormal   Collection Time: 07/15/16  6:36 AM  Result Value Ref Range   WBC 12.2 (H) 4.0 - 10.5 K/uL   RBC 3.48 (L) 4.22 - 5.81 MIL/uL   Hemoglobin 10.1 (L) 13.0 - 17.0 g/dL   HCT 31.4 (L) 39.0 - 52.0 %   MCV 90.2 78.0 - 100.0 fL   MCH 29.0 26.0 - 34.0 pg   MCHC 32.2 30.0 - 36.0 g/dL   RDW 14.8 11.5 - 15.5 %   Platelets 181 150 - 400 K/uL  Glucose, capillary     Status: Abnormal   Collection Time: 07/15/16  8:16 AM  Result Value Ref Range   Glucose-Capillary 139 (H) 65 - 99 mg/dL    Imaging: Dg Chest Portable 1 View  Result Date: 07/13/2016 CLINICAL DATA:  Shortness of breath. Diagnosed with bronchitis yesterday. Wheezing. Fall. EXAM: PORTABLE CHEST 1 VIEW COMPARISON:  Chest radiographs 08/14/2015 FINDINGS:  Chronic hyperinflation and bronchial thickening. The heart is enlarged, stable from prior. Atherosclerosis of the thoracic aorta. Subsegmental left basilar atelectasis. No confluent consolidation, pulmonary edema, pleural effusion or pneumothorax. The bones are under mineralized, remote right clavicle fracture. Remote right rib fractures. IMPRESSION: Chronic hyperinflation and bronchial thickening. Chronic cardiomegaly.  Thoracic aortic atherosclerosis. Electronically Signed   By: Jeb Levering M.D.   On: 07/13/2016 23:21    Assessment:  1. Principal Problem: 2.   Acute respiratory failure with hypoxia (HCC) 3. Active Problems: 4.   Atrial fibrillation with rapid ventricular response ([place]) 5.   Chronic diastolic HF (heart failure) ([place]) 6.   COPD exacerbation (HCC) 7.   Elevated troponin 8.   Hyperglycemia 9.   Dyslipidemia 10.   Pruritic rash 11.   Sepsis ([employer]) 12.   Plan:  1. Respiratory failure with hypoxia - RSV noted on viral panel. Chronic a-fib which is  rate-controlled. Troponin elevated higher than expected d/t demand ischemia. Again discussed cath once respiratory status improves, however, they have significant reservations. Awaiting echo today - if LV function decreased or if there are new WMA's, would push more for cath. On IV heparin. Load aspirin 324 mg today then 81 mg daily thereafter. Increase lipitor to 80 mg QHS.  Time Spent Directly with Patient:  30 minutes  Length of Stay:  LOS: 1 day   Pixie Casino, MD, [facility] Attending Cardiologist [place] 07/15/2016, 8:53 AM

## 2016-07-15 NOTE — Plan of Care (Signed)
Problem: Education: Goal: Knowledge of [facility] Education information/materials will improve Outcome: Progressing Discussed patient plan of care for the night and his breathing getting better with expiratory wheezing decreasing with some teach back displayed.

## 2016-07-15 NOTE — Progress Notes (Signed)
[place] for Heparin (pt on apixaban PTA) Indication: chest pain/ACS  Allergies  Allergen Reactions  . Penicillins Itching    Has patient had a PCN reaction causing immediate rash, facial/tongue/throat swelling, SOB or lightheadedness with hypotension: Yes Has patient had a PCN reaction causing severe rash involving mucus membranes or skin necrosis: No Has patient had a PCN reaction that required hospitalization No Has patient had a PCN reaction occurring within the last 10 years: No If all of the above answers are "NO", then may proceed with Cephalosporin use.   . Sulfonamide Derivatives Other (See Comments)    "it affected my kidneys"  . Tape Other (See Comments)    Arms black and blue, tears skin.  Please use "paper" tape    Patient Measurements: Height: 5\' 3"  (160 cm) Weight: 155 lb 13.8 oz (70.7 kg) IBW/kg (Calculated) : 56.9 Heparin Dosing Weight: ~70 kg  Vital Signs: Temp: 97.7 F (36.5 C) (02/04 0817) Temp Source: Oral (02/04 0817) BP: 142/83 (02/04 0817) Pulse Rate: 66 (02/04 0817)  Labs:  Recent Labs  07/13/16 2254 07/13/16 2313 07/14/16 0210 07/14/16 0715  07/14/16 1322 07/14/16 2117 07/15/16 0636 07/15/16 0854  HGB 11.2* 12.2* 10.3*  --   --   --   --  10.1*  --   HCT 35.1* 36.0* 32.1*  --   --   --   --  31.4*  --   PLT 220  --  188  --   --   --   --  181  --   APTT  --   --   --   --   < > 42* 64* >200* 72*  HEPARINUNFRC  --   --   --   --   --  >2.20* >2.20*  --   --   CREATININE 1.29* 1.10 1.21  --   --   --   --  1.15  --   TROPONINI  --   --  0.48* 2.69*  --  3.90*  --   --   --   < > = values in this interval not displayed.  Estimated Creatinine Clearance: 43 mL/min (by C-G formula based on SCr of 1.15 mg/dL).   Medical History: Past Medical History:  Diagnosis Date  . Allergy   . Anemia 2012, 08/2015   chronic. Acute due to large hematoma 2013. 2017: due to gastric AVM bleeding.   . Anxiety   .  Atrial fibrillation ([place]) 2012   a.  CHA2DS2VASc = 6-->eliquis;  b. 12/2015 Echo: EF 60-65%, no rwma, LVH, mild AS/MS/MR, sev dil LA, mild TR, PASP 26mmHg.  Marland Kitchen CAD (coronary artery disease)    a. 06/2010 Cath: LM nl, LAD 50p/m, LCX 77m, RCA 50p/m -->catheter dissection but good flow, 70d, RPDA 50-70;  b. 06/2010 Relook cath due to bradycardia and inf ST elev: RCA dissection @ ostium extending into coronary cusp and prox RCA, Ao dissection-->less staining than previously-->Med Rx.  Marland Kitchen COPD (chronic obstructive pulmonary disease) ([place]) 2013   exertional dyspnea  . Gastric angiodysplasia with hemorrhage 08/2015  . Hx of colonic polyps 2001, 2011   adenomatous 2001. HP 2001, 2011.  Marland Kitchen Hyperlipidemia   . Myocardial infarction 06/2010.  12/2011.   "twice; 1 day apart; during/after cath". NQMI in setting severe anemia 2013.   Marland Kitchen Neuropathy ([place]) 2014   in feet, likely due to spinal stenosis, DDD, HNP  . Osteoarthritis 2002   spinal stenosis, spondylolisthesis, disc  protrusion multilevel in lumbar spine  . Stroke ([place]) 2016  . Upper GI bleed 08/2015   EGD: 2 small gastric AVMs, 1 actively bleeding.  Both treated with APC ablation, clipping of the bleeder    Assessment: 83 yo male with PMH s/f CAD, AFib on apixaban PTA (last dose documented as 2/2), h/o stroke, HLD, and COPD. Pt is presenting with dyspnea, cough, wheezing, and was noted to have elevated troponins. Pharmacy consulted to dose heparin for ACS, holding apixaban while on heparin. Heparin level was > 2.2 at baseline so will need to dose using aPTTs -FYI - erroneous lab draw explains APTT >200 at 0636 this AM -Lab redraw at 0854 this AM - aPTT= therapeutic x1 at 72, Hgb low stable ~10, PLTC stable, no bleeding noted per RN.    Goal of Therapy:  APTT= 66-102 Heparin level 0.3-0.7 units/ml Monitor platelets by anticoagulation protocol: Yes   Plan:  -Continue heparin at 950 units/hr -daily aPTT, HL, CBC -monitor for s/sx bleeding, clinical  picture -Follow up transition back to apixaban when clinically appropriate   Carlean Jews, Pharm.D. PGY1 Pharmacy Resident 2/4/20189:51 AM Pager [phone]

## 2016-07-15 NOTE — Evaluation (Signed)
Physical Therapy Evaluation Patient Details Name: Eduardo [name] MRN: [id number] DOB: December 23, 1930 Today's Date: 07/15/2016   History of Present Illness  Patient is an 83 yo male admitted 07/13/16 with acute resp failure with hypoxia.  CHF/COPD exacerbation, Afib, elevated troponin (per chart demand ischemia), and hyperkalemia.    PMH:  CHF, COPD, HLD, anemia, anxiety, Afib, CAD, MI, CVA, neuropathy, CKD  Clinical Impression  Patient presents with problems listed below.  Will benefit from acute PT to maximize functional independence prior to discharge home with wife.  Patient was going to gym 2x/week for therapy (unsure if PT versus trainer).  Recommend patient return to gym for this service at d/c.    Follow Up Recommendations Outpatient PT;Supervision for mobility/OOB (Return to OP therapy at gym)    Equipment Recommendations  None recommended by PT    Recommendations for Other Services       Precautions / Restrictions Precautions Precautions: Fall Restrictions Weight Bearing Restrictions: No      Mobility  Bed Mobility Overal bed mobility: Needs Assistance Bed Mobility: Supine to Sit;Sit to Supine     Supine to sit: Min assist;HOB elevated Sit to supine: Min assist;HOB elevated   General bed mobility comments: Min assist to raise trunk to sitting, and to bring LE's onto bed to return to supine.  Patient sat EOB x 12 minutes with good balance.  Patient with DOE and "wheezing" with increased WOB.  Patient performed LE exercises in sitting.  Required frequent rest breaks during exercises, and when talking to answer questions.  O2 sats remained 93 - 97% with mobility on O2.  Transfers                 General transfer comment: NT - Limited session due to elevated troponin, K+ at 5.3, and DOE of 4/4.  Ambulation/Gait                Stairs            Wheelchair Mobility    Modified Rankin (Stroke Patients Only)       Balance Overall balance  assessment: Needs assistance Sitting-balance support: Feet unsupported;No upper extremity supported Sitting balance-Leahy Scale: Fair Sitting balance - Comments: Flexed posture.                                     Pertinent Vitals/Pain Pain Assessment: No/denies pain    Home Living Family/patient expects to be discharged to:: Private residence Living Arrangements: Spouse/significant other Available Help at Discharge: Family;Available 24 hours/day Type of Home: House Home Access: Stairs to enter Entrance Stairs-Rails: Psychiatric nurse of Steps: 3 Home Layout: Two level;Able to live on main level with bedroom/bathroom Home Equipment: Gilford Rile - 2 wheels;Walker - 4 wheels;Cane - single point;Shower seat;Electric scooter      Prior Function Level of Independence: Independent with assistive device(s)         Comments: Patient uses rollator or scooter for longer distances.  Uses cane majority of the time.  Patient independent in ADL's per patient and wife.  Patient drives.  Patient goes to gym with therapist 2x/week.     Hand Dominance        Extremity/Trunk Assessment   Upper Extremity Assessment Upper Extremity Assessment: Generalized weakness;Defer to OT evaluation    Lower Extremity Assessment Lower Extremity Assessment: Generalized weakness;RLE deficits/detail;LLE deficits/detail (At least 4-/5 strength.  ) RLE Sensation: history of peripheral neuropathy  LLE Sensation: history of peripheral neuropathy    Cervical / Trunk Assessment Cervical / Trunk Assessment: Kyphotic  Communication   Communication: No difficulties  Cognition Arousal/Alertness: Awake/alert Behavior During Therapy: WFL for tasks assessed/performed;Anxious Overall Cognitive Status: Within Functional Limits for tasks assessed                      General Comments      Exercises General Exercises - Lower Extremity Ankle Circles/Pumps: AROM;Both;10  reps;Seated Long Arc Quad: AROM;Both;10 reps;Seated Hip Flexion/Marching: AROM;Both;10 reps;Seated   Assessment/Plan    PT Assessment Patient needs continued PT services  PT Problem List Decreased strength;Decreased activity tolerance;Decreased balance;Decreased mobility;Decreased knowledge of use of DME;Cardiopulmonary status limiting activity;Impaired sensation          PT Treatment Interventions DME instruction;Gait training;Functional mobility training;Therapeutic activities;Therapeutic exercise;Balance training;Patient/family education    PT Goals (Current goals can be found in the Care Plan section)  Acute Rehab PT Goals Patient Stated Goal: To feel better PT Goal Formulation: With patient/family Time For Goal Achievement: 07/22/16 Potential to Achieve Goals: Good    Frequency Min 3X/week   Barriers to discharge        Co-evaluation               End of Session Equipment Utilized During Treatment: Oxygen Activity Tolerance: Patient limited by fatigue;Treatment limited secondary to medical complications (Comment) (Limited due to DOE) Patient left: in bed;with call bell/phone within reach;with family/visitor present Nurse Communication: Mobility status (Limited session today due to medical issues)         Time: 0737-1062 PT Time Calculation (min) (ACUTE ONLY): 26 min   Charges:   PT Evaluation $PT Eval High Complexity: 1 Procedure PT Treatments $Therapeutic Activity: 8-22 mins   PT G Codes:        Despina Pole 08/10/16, 5:08 PM Carita Pian. Sanjuana Kava, [place] Pager [phone]

## 2016-07-15 NOTE — Progress Notes (Signed)
  Echocardiogram 2D Echocardiogram has been performed.  Eduardo [name] 07/15/2016, 4:59 PM

## 2016-07-15 NOTE — Progress Notes (Signed)
PROGRESS NOTE    Eduardo [name]  IDP:[id number] DOB: 31-Jan-1931 DOA: 07/13/2016  PCP: [name], MD   Brief Narrative:  83 year old male w/ known h/o Chronic respiratory failure in setting of COPD (f/b [place]). Admitted 2/3 w/ acute bronchitis, AECOPD and resultant acute on chronic respiratory failure   Assessment & Plan:   Principal Problem:   Acute on chronic respiratory failure with hypoxia ([place]), RSV positive  - pt with known advanced COPD and oxygen dependent at baseline, failed recent treatment with doxycycline and prednisone in an outpatient setting and now admitted for worsening acute COPD exacerbation with acute bronchitis  - seems bit worse this AM with more wheezing and rales - continue to monitor in SDU - continue BD's scheduled and as needed - increase frequency of steroids to QID (was on TID) - add antitussives as needed  - flutter valve ordered  - keep on empiric Levaquin for now, Ok to stop Vanc  - PCCM following, assistance appreciated   Active Problems:   Atrial fibrillation with rapid ventricular response ([place]) - CHADS2VASC2 6 - with mild troponin elevation and likely secondary to sepsis - pt also with known CAD - currently on cardizem and heparin drip - cardiology team following, assistance appreciated  - ECHO pending     CAD with elevated troponin - added aspirin 325 mg today per cardiology and after that 82 mg PO QD  - cardiology also increased lipitor to 80 mg PO QD  - may need cath     Sepsis secondary to RSV bronchitis - treat COPD, supportive care  - empiric ABX Levaquin for now - lactic acid cleared     Hyperkalemia - monitor  - BMP In AM  DVT prophylaxis: On heparin drip  Code Status: No CPR and no intubation but OK with medications per ACLS  Family Communication: Patient and wife at bedside  Disposition Plan: to be determined but likely home   Consultants:   PCCM  Cardiology   Procedures:   None  Antimicrobials:    Vancomycin 2/3 --> 2/4  Levaquin 2/3 -->   Subjective: Still with dyspnea and chest tightness.   Objective: Vitals:   07/15/16 0823 07/15/16 0900 07/15/16 1200 07/15/16 1300  BP:  115/69 (!) 146/76 134/75  Pulse:  70 64 67  Resp:  19 16 15   Temp:   97.6 F (36.4 C)   TempSrc:   Oral   SpO2: 95% 91% 94% 96%  Weight:      Height:        Intake/Output Summary (Last 24 hours) at 07/15/16 1431 Last data filed at 07/15/16 1300  Gross per 24 hour  Intake           685.01 ml  Output              600 ml  Net            85.01 ml   Filed Weights   07/14/16 1443  Weight: 70.7 kg (155 lb 13.8 oz)    Examination:  General exam: Appears to be in mild distress due to dyspnea.  Respiratory system: rales bilaterally with diffuse wheezing and tachypnea  Cardiovascular system: IRRR. No JVD, rubs, gallops or clicks.  Gastrointestinal system: Abdomen is nondistended, soft and nontender. No organomegaly  Central nervous system: Alert and oriented. No focal neurological deficits. Psychiatry: Judgement and insight appear normal. Mood & affect appropriate.    Data Reviewed: I have personally reviewed following labs and  imaging studies  CBC:  Recent Labs Lab 07/13/16 2254 07/13/16 2313 07/14/16 0210 07/15/16 0636  WBC 16.6*  --  13.8* 12.2*  HGB 11.2* 12.2* 10.3* 10.1*  HCT 35.1* 36.0* 32.1* 31.4*  MCV 90.2  --  90.2 90.2  PLT 220  --  188 810   Basic Metabolic Panel:  Recent Labs Lab 07/13/16 2254 07/13/16 2313 07/14/16 0210 07/15/16 0636  NA 140 140 139 135  K 3.9 3.9 3.8 5.3*  CL 101 104 103 102  CO2 24  --  21* 21*  GLUCOSE 153* 152* 232* 148*  BUN 35* 36* 39* 49*  CREATININE 1.29* 1.10 1.21 1.15  CALCIUM 10.3  --  9.9 10.0   Cardiac Enzymes:  Recent Labs Lab 07/14/16 0210 07/14/16 0715 07/14/16 1322  TROPONINI 0.48* 2.69* 3.90*   CBG:  Recent Labs Lab 07/14/16 1222 07/14/16 1705 07/14/16 2210 07/15/16 0816 07/15/16 1211  GLUCAP 153* 135*  156* 139* 178*   Recent Results (from the past 240 hour(s))  Respiratory Panel by PCR     Status: Abnormal   Collection Time: 07/14/16 12:09 AM  Result Value Ref Range Status   Adenovirus NOT DETECTED NOT DETECTED Final   Coronavirus 229E NOT DETECTED NOT DETECTED Final   Coronavirus HKU1 NOT DETECTED NOT DETECTED Final   Coronavirus NL63 NOT DETECTED NOT DETECTED Final   Coronavirus OC43 NOT DETECTED NOT DETECTED Final   Metapneumovirus NOT DETECTED NOT DETECTED Final   Rhinovirus / Enterovirus NOT DETECTED NOT DETECTED Final   Influenza A NOT DETECTED NOT DETECTED Final   Influenza B NOT DETECTED NOT DETECTED Final   Parainfluenza Virus 1 NOT DETECTED NOT DETECTED Final   Parainfluenza Virus 2 NOT DETECTED NOT DETECTED Final   Parainfluenza Virus 3 NOT DETECTED NOT DETECTED Final   Parainfluenza Virus 4 NOT DETECTED NOT DETECTED Final   Respiratory Syncytial Virus DETECTED (A) NOT DETECTED Final    Comment: CRITICAL RESULT CALLED TO, READ BACK BY AND VERIFIED WITH: Adora Fridge AT 1751 07/15/16 BY L BENFIELD    Bordetella pertussis NOT DETECTED NOT DETECTED Final   Chlamydophila pneumoniae NOT DETECTED NOT DETECTED Final   Mycoplasma pneumoniae NOT DETECTED NOT DETECTED Final  Culture, blood (routine x 2) Call MD if unable to obtain prior to antibiotics being given     Status: None (Preliminary result)   Collection Time: 07/14/16  2:10 AM  Result Value Ref Range Status   Specimen Description BLOOD RIGHT ARM  Final   Special Requests BOTTLES DRAWN AEROBIC AND ANAEROBIC 5ML  Final   Culture NO GROWTH 1 DAY  Final   Report Status PENDING  Incomplete  Culture, blood (routine x 2) Call MD if unable to obtain prior to antibiotics being given     Status: None (Preliminary result)   Collection Time: 07/14/16  2:15 AM  Result Value Ref Range Status   Specimen Description BLOOD RIGHT HAND  Final   Special Requests BOTTLES DRAWN AEROBIC AND ANAEROBIC 5ML  Final   Culture NO GROWTH 1 DAY   Final   Report Status PENDING  Incomplete  MRSA PCR Screening     Status: None   Collection Time: 07/14/16  2:37 PM  Result Value Ref Range Status   MRSA by PCR NEGATIVE NEGATIVE Final    Comment:        The GeneXpert MRSA Assay (FDA approved for NASAL specimens only), is one component of a comprehensive MRSA colonization surveillance program. It is not intended to diagnose  MRSA infection nor to guide or monitor treatment for MRSA infections.     Radiology Studies: Dg Chest Portable 1 View  Result Date: 07/13/2016 CLINICAL DATA:  Shortness of breath. Diagnosed with bronchitis yesterday. Wheezing. Fall. EXAM: PORTABLE CHEST 1 VIEW COMPARISON:  Chest radiographs 08/14/2015 FINDINGS: Chronic hyperinflation and bronchial thickening. The heart is enlarged, stable from prior. Atherosclerosis of the thoracic aorta. Subsegmental left basilar atelectasis. No confluent consolidation, pulmonary edema, pleural effusion or pneumothorax. The bones are under mineralized, remote right clavicle fracture. Remote right rib fractures. IMPRESSION: Chronic hyperinflation and bronchial thickening. Chronic cardiomegaly.  Thoracic aortic atherosclerosis. Electronically Signed   By: Jeb Levering M.D.   On: 07/13/2016 23:21      Scheduled Meds: . acetaminophen  1,000 mg Oral BID  . arformoterol  15 mcg Nebulization BID  . [START ON 07/16/2016] aspirin  81 mg Oral Daily  . atorvastatin  80 mg Oral q1800  . azelastine  2 spray Each Nare QHS  . budesonide (PULMICORT) nebulizer solution  0.5 mg Nebulization BID  . chlorpheniramine-HYDROcodone  5 mL Oral Q12H  . escitalopram  20 mg Oral QHS  . fenofibrate  160 mg Oral QHS  . fluticasone  2 spray Each Nare BID  . guaiFENesin  600 mg Oral BID  . insulin aspart  0-5 Units Subcutaneous QHS  . insulin aspart  0-9 Units Subcutaneous TID WC  . lamoTRIgine  150 mg Oral QHS  . levalbuterol  0.63 mg Nebulization Q6H  . [START ON 07/16/2016] levofloxacin (LEVAQUIN) IV   500 mg Intravenous Q48H  . loratadine  10 mg Oral Daily  . methylPREDNISolone (SOLU-MEDROL) injection  60 mg Intravenous Q6H  . multivitamin with minerals  1 tablet Oral QHS  . pantoprazole  40 mg Oral BID  . [START ON 07/16/2016] spironolactone  25 mg Oral Once per day on Mon Wed Fri  . verapamil  240 mg Oral QHS   Continuous Infusions: . diltiazem (CARDIZEM) infusion Stopped (07/15/16 1100)  . heparin 950 Units/hr (07/14/16 2307)     LOS: 1 day   Time spent: 20 minutes   Faye Ramsay, MD [facility] Pager [phone]  If 7PM-7AM, please contact night-coverage [email or web address] Password [email or web address] 07/15/2016, 2:31 PM

## 2016-07-16 ENCOUNTER — Inpatient Hospital Stay (HOSPITAL_COMMUNITY): Payer: PPO

## 2016-07-16 DIAGNOSIS — J9601 Acute respiratory failure with hypoxia: Secondary | ICD-10-CM

## 2016-07-16 DIAGNOSIS — J441 Chronic obstructive pulmonary disease with (acute) exacerbation: Secondary | ICD-10-CM

## 2016-07-16 DIAGNOSIS — I2510 Atherosclerotic heart disease of native coronary artery without angina pectoris: Secondary | ICD-10-CM

## 2016-07-16 LAB — BASIC METABOLIC PANEL
Anion gap: 9 (ref 5–15)
BUN: 67 mg/dL — ABNORMAL HIGH (ref 6–20)
CHLORIDE: 102 mmol/L (ref 101–111)
CO2: 22 mmol/L (ref 22–32)
Calcium: 9.9 mg/dL (ref 8.9–10.3)
Creatinine, Ser: 1.51 mg/dL — ABNORMAL HIGH (ref 0.61–1.24)
GFR, EST AFRICAN AMERICAN: 47 mL/min — AB (ref >60)
GFR, EST NON AFRICAN AMERICAN: 41 mL/min — AB (ref >60)
Glucose, Bld: 156 mg/dL — ABNORMAL HIGH (ref 65–99)
POTASSIUM: 4.8 mmol/L (ref 3.5–5.1)
SODIUM: 133 mmol/L — AB (ref 135–145)

## 2016-07-16 LAB — CBC
HCT: 30 % — ABNORMAL LOW (ref 39.0–52.0)
Hemoglobin: 9.7 g/dL — ABNORMAL LOW (ref 13.0–17.0)
MCH: 29.2 pg (ref 26.0–34.0)
MCHC: 32.3 g/dL (ref 30.0–36.0)
MCV: 90.4 fL (ref 78.0–100.0)
Platelets: 201 10*3/uL (ref 150–400)
RBC: 3.32 MIL/uL — ABNORMAL LOW (ref 4.22–5.81)
RDW: 14.5 % (ref 11.5–15.5)
WBC: 14 10*3/uL — ABNORMAL HIGH (ref 4.0–10.5)

## 2016-07-16 LAB — EXPECTORATED SPUTUM ASSESSMENT W GRAM STAIN, RFLX TO RESP C

## 2016-07-16 LAB — APTT
APTT: 136 s — AB (ref 24–36)
aPTT: 52 seconds — ABNORMAL HIGH (ref 24–36)

## 2016-07-16 LAB — GLUCOSE, CAPILLARY
Glucose-Capillary: 143 mg/dL — ABNORMAL HIGH (ref 65–99)
Glucose-Capillary: 136 mg/dL — ABNORMAL HIGH (ref 65–99)
Glucose-Capillary: 146 mg/dL — ABNORMAL HIGH (ref 65–99)
Glucose-Capillary: 150 mg/dL — ABNORMAL HIGH (ref 65–99)

## 2016-07-16 LAB — EXPECTORATED SPUTUM ASSESSMENT W REFEX TO RESP CULTURE

## 2016-07-16 LAB — HEPARIN LEVEL (UNFRACTIONATED): HEPARIN UNFRACTIONATED: 1.8 [IU]/mL — AB (ref 0.30–0.70)

## 2016-07-16 MED ORDER — METHYLPREDNISOLONE SODIUM SUCC 125 MG IJ SOLR: 80.0000 mg | Freq: Two times a day (BID) | INTRAMUSCULAR | Status: DC

## 2016-07-16 MED ORDER — DEXAMETHASONE SODIUM PHOSPHATE 10 MG/ML IJ SOLN
6.0000 mg | Freq: Four times a day (QID) | INTRAMUSCULAR | Status: DC
  Administered 2016-07-16 – 2016-07-18 (×7): 6 mg via INTRAVENOUS
  Filled 2016-07-16 (×9): qty 0.6

## 2016-07-16 MED ORDER — PNEUMOCOCCAL VAC POLYVALENT 25 MCG/0.5ML IJ INJ: 0.5000 mL | INJECTION | INTRAMUSCULAR | Status: DC | PRN

## 2016-07-16 MED ORDER — APIXABAN 2.5 MG PO TABS
2.5000 mg | ORAL_TABLET | Freq: Two times a day (BID) | ORAL | Status: DC
Start: 2016-07-16 — End: 2016-07-18
  Administered 2016-07-16 – 2016-07-18 (×5): 2.5 mg via ORAL
  Filled 2016-07-16 (×5): qty 1

## 2016-07-16 MED ORDER — METHYLPREDNISOLONE SODIUM SUCC 125 MG IJ SOLR: 80.0000 mg | Freq: Four times a day (QID) | INTRAMUSCULAR | Status: DC

## 2016-07-16 MED ORDER — SODIUM CHLORIDE 0.9% FLUSH
10.0000 mL | INTRAVENOUS | Status: DC | PRN
  Administered 2016-07-16: 40 mL
  Filled 2016-07-16: qty 40

## 2016-07-16 NOTE — Progress Notes (Signed)
[facility] [place] Progress Note Patient Name: Eduardo [name] DOB: Jan 25, 1931 MRN: [id number]   Date of Service  07/16/2016  HPI/Events of Note  Nurse reports that pt has wheezes and crackles. ? Fluid overload  eICU Interventions  Order CXR     Intervention Category Intermediate Interventions: Other:  Juanice Warburton 07/16/2016, 6:18 AM

## 2016-07-16 NOTE — Progress Notes (Signed)
Progress Note  Patient Name: Eduardo [name] Date of Encounter: 07/16/2016  Primary Cardiologist: Pernell Dupre  Subjective   No chest pain. Still with dyspnea.   Inpatient Medications    Scheduled Meds: . acetaminophen  1,000 mg Oral BID  . arformoterol  15 mcg Nebulization BID  . aspirin  81 mg Oral Daily  . atorvastatin  80 mg Oral q1800  . azelastine  2 spray Each Nare QHS  . budesonide (PULMICORT) nebulizer solution  0.5 mg Nebulization BID  . chlorpheniramine-HYDROcodone  5 mL Oral Q12H  . escitalopram  20 mg Oral QHS  . fenofibrate  160 mg Oral QHS  . fluticasone  2 spray Each Nare BID  . guaiFENesin  600 mg Oral BID  . insulin aspart  0-5 Units Subcutaneous QHS  . insulin aspart  0-9 Units Subcutaneous TID WC  . lamoTRIgine  150 mg Oral QHS  . levalbuterol  0.63 mg Nebulization Q6H  . levofloxacin (LEVAQUIN) IV  500 mg Intravenous Q48H  . loratadine  10 mg Oral Daily  . methylPREDNISolone (SOLU-MEDROL) injection  60 mg Intravenous Q6H  . multivitamin with minerals  1 tablet Oral QHS  . pantoprazole  40 mg Oral BID  . spironolactone  25 mg Oral Once per day on Mon Wed Fri  . verapamil  240 mg Oral QHS   Continuous Infusions: . diltiazem (CARDIZEM) infusion 5 mg/hr (07/16/16 0508)  . heparin 1,150 Units/hr (07/16/16 0431)   PRN Meds: acetaminophen **OR** acetaminophen, guaiFENesin-dextromethorphan, levalbuterol, levalbuterol, ondansetron **OR** ondansetron (ZOFRAN) IV, pneumococcal 23 valent vaccine, triamcinolone cream   Vital Signs    Vitals:   07/16/16 0600 07/16/16 0700 07/16/16 0800 07/16/16 0826  BP: (!) 155/99 (!) 158/91    Pulse: 86 74    Resp: (!) 22 15    Temp:   97.7 F (36.5 C)   TempSrc:   Oral   SpO2: 95% 96%  95%  Weight:      Height:        Intake/Output Summary (Last 24 hours) at 07/16/16 1234 Last data filed at 07/16/16 0700  Gross per 24 hour  Intake           674.98 ml  Output             1300 ml  Net          -625.02 ml    Filed Weights   07/14/16 1443 07/16/16 0400  Weight: 155 lb 13.8 oz (70.7 kg) 156 lb 15.5 oz (71.2 kg)    Telemetry    Atrial fib, rate 70s- Personally Reviewed  ECG    Physical Exam   GEN: Appears to be dyspneic. Audible wheezing when standing by the bedside   Neck: No JVD Cardiac: Irreg irreg, no murmurs, rubs, or gallops.  Respiratory: Coarse BS throughout both lungs with poor air movement.  GI: Soft, nontender, non-distended  Ext: No edema; No deformity. Neuro:  Nonfocal  Psych: Normal affect   Labs    Chemistry Recent Labs Lab 07/14/16 0210 07/15/16 0636 07/16/16 0222  NA 139 135 133*  K 3.8 5.3* 4.8  CL 103 102 102  CO2 21* 21* 22  GLUCOSE 232* 148* 156*  BUN 39* 49* 67*  CREATININE 1.21 1.15 1.51*  CALCIUM 9.9 10.0 9.9  GFRNONAA 54* 57* 41*  GFRAA >60 >60 47*  ANIONGAP 15 12 9      Hematology Recent Labs Lab 07/14/16 0210 07/15/16 0636 07/16/16 0222  WBC 13.8* 12.2* 14.0*  RBC 3.56* 3.48* 3.32*  HGB 10.3* 10.1* 9.7*  HCT 32.1* 31.4* 30.0*  MCV 90.2 90.2 90.4  MCH 28.9 29.0 29.2  MCHC 32.1 32.2 32.3  RDW 14.4 14.8 14.5  PLT 188 181 201    Cardiac Enzymes Recent Labs Lab 07/14/16 0210 07/14/16 0715 07/14/16 1322  TROPONINI 0.48* 2.69* 3.90*    Recent Labs Lab 07/13/16 2311 07/14/16 0241  TROPIPOC 0.00 0.57*     BNP Recent Labs Lab 07/13/16 2303  BNP 261.9*     DDimer No results for input(s): DDIMER in the last 168 hours.   Radiology    Dg Chest Port 1 View  Result Date: 07/16/2016 CLINICAL DATA:  Acute respiratory failure. Cough and shortness of breath and chest congestion. EXAM: PORTABLE CHEST 1 VIEW COMPARISON:  07/13/2016 and 08/14/2015 FINDINGS: Chronic cardiomegaly with aortic atherosclerosis. Pulmonary vascularity is normal and the lungs are clear. No acute bone abnormality. Old fracture of the right clavicle. IMPRESSION: No acute abnormality.  Chronic cardiomegaly. Aortic atherosclerosis. Electronically Signed   By:  Lorriane Shire M.D.   On: 07/16/2016 07:59    Cardiac Studies   Echo 07/15/16: Left ventricle: The cavity size was normal. Wall thickness was   increased in a pattern of mild LVH. Systolic function was mildly   reduced. The estimated ejection fraction was in the range of 45%   to 50%. Mild diffuse hypokinesis with no identifiable regional   variations. Doppler parameters are consistent with elevated mean   left atrial filling pressure. - Aortic valve: Valve mobility was mildly restricted. There was   mild stenosis. Valve area (VTI): 1.5 cm^2. Valve area (Vmax): 1.6   cm^2. Valve area (Vmean): 1.49 cm^2. - Mitral valve: Severely calcified annulus. Mildly thickened   leaflets . The findings are consistent with mild stenosis. There   was moderate to severe regurgitation. Valve area by continuity   equation (using LVOT flow): 1.61 cm^2. - Left atrium: The atrium was severely dilated. - Right ventricle: The cavity size was mildly dilated. Systolic   function was mildly reduced. - Right atrium: The atrium was severely dilated. - Tricuspid valve: There was mild-moderate regurgitation directed   centrally. - Pulmonary arteries: Systolic pressure was mildly increased. PA   peak pressure: 50 mm Hg (S).  Patient Profile     83 y.o. male with history of CAD, chronic atrial fib on Eliquis, COPD, HLD and CVA admitted with dyspnea, cough and wheezing. Troponin was elevated. He is being treated for a COPD exacerbation/acute URI.    Assessment & Plan    1. CAD/elevated troponin: Pt with known moderate CAD by cath in January 2012. This was complicated by dissection of the ostial RCA. He has done well with medical management since then. Now with elevated troponin in the setting of COPD exacerbation and rapid atrial fib. Echo 07/15/16 with mild global LV systolic dysfunction. I do not think there is a clear cut indication for cardiac cath right now. His elevated troponin is likely due to demand ischemia as  he has diffuse moderate CAD with heavily calcified coronaries by cath 6 years ago. I would continue medical management of CAD for now.   2. Persistent atrial fibrillation: rapid atrial fib on admission.  HR now controlled on verapamil. Should be ok to stop heparin and restart Eliquis since no invasive procedures are planned.   3. Mitral regurgitation: Moderate to severe MR by echo this week. This may be contributing to his LV dysfunction. He is not a good  candidate for open surgical procedure. Will follow    Signed, Lauree Chandler, MD  07/16/2016, 12:34 PM

## 2016-07-16 NOTE — Progress Notes (Signed)
[place] for Heparin Indication: chest pain/ACS  Allergies  Allergen Reactions  . Penicillins Itching    Has patient had a PCN reaction causing immediate rash, facial/tongue/throat swelling, SOB or lightheadedness with hypotension: Yes Has patient had a PCN reaction causing severe rash involving mucus membranes or skin necrosis: No Has patient had a PCN reaction that required hospitalization No Has patient had a PCN reaction occurring within the last 10 years: No If all of the above answers are "NO", then may proceed with Cephalosporin use.   . Sulfonamide Derivatives Other (See Comments)    "it affected my kidneys"  . Tape Other (See Comments)    Arms black and blue, tears skin.  Please use "paper" tape    Patient Measurements: Height: 5\' 3"  (160 cm) Weight: 156 lb 15.5 oz (71.2 kg) IBW/kg (Calculated) : 56.9 Heparin Dosing Weight: ~70 kg  Vital Signs: Temp: 97.7 F (36.5 C) (02/05 0400) Temp Source: Oral (02/05 0400) BP: 167/100 (02/05 0400) Pulse Rate: 70 (02/05 0400)  Labs:  Recent Labs  07/14/16 0210 07/14/16 0715  07/14/16 1322 07/14/16 2117 07/15/16 0636 07/15/16 0854 07/16/16 0222 07/16/16 0223  HGB 10.3*  --   --   --   --  10.1*  --  9.7*  --   HCT 32.1*  --   --   --   --  31.4*  --  30.0*  --   PLT 188  --   --   --   --  181  --  201  --   APTT  --   --   < > 42* 64* >200* 72*  --  52*  HEPARINUNFRC  --   --   --  >2.20* >2.20*  --   --   --  1.80*  CREATININE 1.21  --   --   --   --  1.15  --  1.51*  --   TROPONINI 0.48* 2.69*  --  3.90*  --   --   --   --   --   < > = values in this interval not displayed.  Estimated Creatinine Clearance: 32.8 mL/min (by C-G formula based on SCr of 1.51 mg/dL (H)).  Assessment: 83 y.o. male with h/o Afib, Eliquis on hold, for heparin  Goal of Therapy:  APTT= 66-102 Heparin level 0.3-0.7 units/ml Monitor platelets by anticoagulation protocol: Yes   Plan:  Increase  Heparin 1150 units/hr PTT in 8 hours  Phillis Knack, PharmD, BCPS

## 2016-07-16 NOTE — Evaluation (Signed)
Occupational Therapy Evaluation Patient Details Name: Eduardo [name] MRN: [id number] DOB: [birth year] Today's Date: 07/16/2016    History of Present Illness Patient is an 83 yo male admitted 07/13/16 with acute resp failure with hypoxia.  CHF/COPD exacerbation, Afib, elevated troponin (per chart demand ischemia), and hyperkalemia.    PMH:  CHF, COPD, HLD, anemia, anxiety, Afib, CAD, MI, CVA, neuropathy, CKD   Clinical Impression   Pt is independent at baseline in ADL. Reports impaired balance at baseline. Pt presents with audible wheezing, poor activity tolerance and generalized weakness. Pt will benefit from acute OT to address ADL, ADL transfers and instruct in energy conservation strategies. Will follow.    Follow Up Recommendations  Home health OT    Equipment Recommendations  None recommended by OT    Recommendations for Other Services       Precautions / Restrictions Precautions Precautions: Fall Restrictions Weight Bearing Restrictions: No      Mobility Bed Mobility Overal bed mobility: Needs Assistance Bed Mobility: Supine to Sit;Sit to Supine     Supine to sit: Min assist;HOB elevated Sit to supine: Min assist;HOB elevated   General bed mobility comments: min assist for trunk with supine to sit, assisted LEs back into bed, increased WOB and wheezing with bed mobility.  Transfers Overall transfer level: Needs assistance Equipment used: Rolling walker (2 wheeled) Transfers: Sit to/from Stand Sit to Stand: Min assist         General transfer comment: stood at EOB and took steps to [place] Overall balance assessment: Needs assistance Sitting-balance support: Feet unsupported;No upper extremity supported Sitting balance-Leahy Scale: Fair Sitting balance - Comments: Flexed posture.     Standing balance-Leahy Scale: Poor                              ADL Overall ADL's : Needs assistance/impaired Eating/Feeding: Independent;Bed  level   Grooming: Wash/dry hands;Wash/dry face;Sitting;Set up   Upper Body Bathing: Set up;Sitting   Lower Body Bathing: Minimal assistance;Sit to/from stand   Upper Body Dressing : Set up;Sitting   Lower Body Dressing: Minimal assistance;Sit to/from stand Lower Body Dressing Details (indicate cue type and reason): able to cross foot over opposite knee  Toilet Transfer: Minimal assistance;Ambulation;RW;BSC   Toileting- Clothing Manipulation and Hygiene: Minimal assistance;Sit to/from stand         General ADL Comments: Began education in energy conservation. Pt is aware of pursed lip breathing.     Vision     Perception     Praxis      Pertinent Vitals/Pain Pain Assessment: No/denies pain     Hand Dominance Right   Extremity/Trunk Assessment Upper Extremity Assessment Upper Extremity Assessment: Overall WFL for tasks assessed (bruised R forearm with mild edema)   Lower Extremity Assessment Lower Extremity Assessment: Defer to PT evaluation RLE Sensation: history of peripheral neuropathy LLE Sensation: history of peripheral neuropathy   Cervical / Trunk Assessment Cervical / Trunk Assessment: Kyphotic   Communication Communication Communication: No difficulties   Cognition Arousal/Alertness: Awake/alert Behavior During Therapy: WFL for tasks assessed/performed;Anxious Overall Cognitive Status: Within Functional Limits for tasks assessed                     General Comments       Exercises       Shoulder Instructions      Home Living Family/patient expects to be discharged to:: Private residence Living Arrangements:  Spouse/significant other Available Help at Discharge: Family;Available 24 hours/day Type of Home: House Home Access: Stairs to enter [employer] of Steps: 3 Entrance Stairs-Rails: Right;Left Home Layout: Two level;Able to live on main level with bedroom/bathroom     Bathroom Shower/Tub: Walk-in shower   Bathroom  Toilet: Handicapped height     Home Equipment: Environmental consultant - 2 wheels;Walker - 4 wheels;Cane - single point;Electric scooter;Shower seat - built in;Grab bars - toilet;Grab bars - tub/shower          Prior Functioning/Environment Level of Independence: Independent with assistive device(s)        Comments: Patient uses rollator or scooter for longer distances.  Uses cane majority of the time.  Patient independent in ADL's per patient and wife.  Patient drives.  Patient goes to gym with therapist 2x/week.        OT Problem List: Decreased strength;Impaired balance (sitting and/or standing);Decreased activity tolerance;Decreased knowledge of use of DME or AE;Cardiopulmonary status limiting activity   OT Treatment/Interventions: Self-care/ADL training;Energy conservation;DME and/or AE instruction;Therapeutic activities;Patient/family education;Balance training    OT Goals(Current goals can be found in the care plan section) Acute Rehab OT Goals Patient Stated Goal: To feel better OT Goal Formulation: With patient Time For Goal Achievement: 07/30/16 Potential to Achieve Goals: Good ADL Goals Pt Will Perform Grooming: with supervision;standing Pt Will Perform Lower Body Bathing: with supervision;sit to/from stand Pt Will Perform Lower Body Dressing: with supervision;sit to/from stand Pt Will Transfer to Toilet: with supervision;ambulating Pt Will Perform Toileting - Clothing Manipulation and hygiene: with supervision;sitting/lateral leans Pt Will Perform Tub/Shower Transfer: with supervision;ambulating;shower seat;grab bars;rolling walker Additional ADL Goal #1: Pt will utilize energy conservation strategies in ADL independently.  OT Frequency: Min 2X/week   Barriers to D/C:            Co-evaluation              End of Session Equipment Utilized During Treatment: Rolling walker;Gait belt;Oxygen Nurse Communication:  (aware of bruising in R UE, elevated on 2 pillows, moved BP  c)  Activity Tolerance: Patient limited by fatigue Patient left: in bed;with call bell/phone within reach;with family/visitor present   Time: 2778-2423 OT Time Calculation (min): 21 min Charges:  OT General Charges $OT Visit: 1 Procedure OT Evaluation $OT Eval Moderate Complexity: 1 Procedure G-Codes:    Malka So 07/16/2016, 12:46 PM  [phone]

## 2016-07-16 NOTE — Plan of Care (Signed)
Problem: Education: Goal: Knowledge of [facility] Education information/materials will improve Outcome: Progressing Discussed picc line, droplet precautions and RSV readings with the patient and family with some teach back displayed.

## 2016-07-16 NOTE — Progress Notes (Signed)
Name: Eduardo [name] MRN: [id number] DOB: Oct 21, 1930    ADMISSION DATE:  07/13/2016 CONSULTATION DATE:  2/3  REFERRING MD :  Doyle Askew   CHIEF COMPLAINT:  Acute on chronic respiratory failure   BRIEF PATIENT DESCRIPTION:  83 year old male w/ known h/o Chronic respiratory failure in setting of COPD (f/b Yanely Mast). Admitted 2/3 w/ acute bronchitis, AECOPD and resultant acute on chronic respiratory failure   SIGNIFICANT EVENTS    STUDIES:     HISTORY OF PRESENT ILLNESS:   This is a 83 year old male f/b Ercie Eliasen for COPD on chronic oxygen. Was in usual state of health until ~ 7d ago when developed cough. It was initially productive of grey sputum and began to be associated w/ increasing shortness of breath and wheezing so he actually went to our office on 2/1. At that time he was felt to have an AECOPD. He already had doxy for a rash he was being treated for. We instructed him to continue the doxy and he was prescribed a prednisone taper. He continued to worsen over the following days. The cough was almost persistent and he developed raspy voice w/ rhonchus upper airway noises. The pm hrs of 2/2 the shortness of breath progressed to resting dyspnea so he called EMS and reported to the ER. In the ER he was found to be in [place] w/ RVR, was in acute respiratory distress w/ marked accessory muscle use and required escalating oxygen to maintain sats > 90%. PCCM was asked to see re: acute on chronic respiratory failure   SUBJECTIVE:  Still with UA noise, dyspnea. resp pattern a bit better   VITAL SIGNS: Temp:  [97.6 F (36.4 C)-98.3 F (36.8 C)] 97.6 F (36.4 C) (02/05 1337) Pulse Rate:  [70-86] 75 (02/05 1337) Resp:  [10-22] 16 (02/05 1337) BP: (141-172)/(71-104) 163/83 (02/05 1337) SpO2:  [92 %-99 %] 96 % (02/05 1337) Weight:  [71.2 kg (156 lb 15.5 oz)] 71.2 kg (156 lb 15.5 oz) (02/05 0400)  PHYSICAL EXAMINATION: General: elderly man in bed, on O2, NAD but loud UA noise Neuro:  Awake and  appropriate, non-focal HEENT:  OP clear, no LAD, loud insp and exp stridor Cardiovascular:  Irreg irreg, controlled rate on dilt 5 Lungs:  Bilateral exp wheezes + UA referred noise Abdomen: Soft benign Musculoskeletal:  No deformities Skin:  Large UE bruise, no rash   Recent Labs Lab 07/14/16 0210 07/15/16 0636 07/16/16 0222  NA 139 135 133*  K 3.8 5.3* 4.8  CL 103 102 102  CO2 21* 21* 22  BUN 39* 49* 67*  CREATININE 1.21 1.15 1.51*  GLUCOSE 232* 148* 156*    Recent Labs Lab 07/13/16 2254 07/14/16 0210 07/14/16 0244 07/14/16 0715 07/14/16 1322 07/15/16 0636 07/16/16 0222  WBC 16.6* 13.8*  --   --   --  12.2* 14.0*  LATICACIDVEN  --   --  5.29* 3.0* 1.5  --   --       Recent Labs Lab 07/14/16 0210 07/15/16 0636 07/16/16 0222  HGB 10.3* 10.1* 9.7*  HCT 32.1* 31.4* 30.0*  WBC 13.8* 12.2* 14.0*  PLT 188 181 201   Dg Chest Port 1 View  Result Date: 07/16/2016 CLINICAL DATA:  Acute respiratory failure. Cough and shortness of breath and chest congestion. EXAM: PORTABLE CHEST 1 VIEW COMPARISON:  07/13/2016 and 08/14/2015 FINDINGS: Chronic cardiomegaly with aortic atherosclerosis. Pulmonary vascularity is normal and the lungs are clear. No acute bone abnormality. Old fracture of the right clavicle.  IMPRESSION: No acute abnormality.  Chronic cardiomegaly. Aortic atherosclerosis. Electronically Signed   By: Lorriane Shire M.D.   On: 07/16/2016 07:59    ASSESSMENT / PLAN:  Acute on chronic hypoxic respiratory failure Acute bronchitis AECOPD  RSV pharyngitis  Plan Scheduled nebs Solumedrol Would stop levaquin after 5 days Continue nasal steroid tussionex prn BID PPI   Af w/ RVR; mild troponin rise  Known CAD Plan dilt gtt and further management per cardiology  Anticoagulation w Eliquis lipitor ASA   Acute on ckd stage III Plan Follow BMP  Hyperglycemia  Plan ssi    Resolved problems: SIRS / Sepsis   Time Spent 35 minutes  Baltazar Apo,  MD, PhD 07/16/2016, 3:02 PM [facility] [phone] or if no answer [phone]

## 2016-07-16 NOTE — Progress Notes (Addendum)
PROGRESS NOTE    Eduardo [name]  IOM:[id number] DOB: 1930/06/18 DOA: 07/13/2016  PCP: [name], MD   Brief Narrative:  83 year old male w/ known h/o Chronic respiratory failure in setting of COPD (f/b [place]). Admitted 2/3 w/ acute bronchitis, AECOPD and resultant acute on chronic respiratory failure   Assessment & Plan:   Principal Problem:   Acute on chronic respiratory failure with hypoxia (HCC), RSV positive  - pt with known advanced COPD and oxygen dependent at baseline, failed recent treatment with doxycycline and prednisone in an outpatient setting and now admitted for worsening acute COPD exacerbation with acute bronchitis  - seems bit better this AM but still with diffuse wheezing and rales bilaterally  - continue to monitor in SDU - continue BD's scheduled and as needed - increased frequency of steroids to QID (was on TID) - added antitussives as needed  - flutter valve ordered and pt using so far Q2 hours when awake  - keep on empiric Levaquin for now, vanc was discontinued 2/4 - PCCM following, assistance appreciated   Active Problems:   Atrial fibrillation with rapid ventricular response ([place]) - CHADS2VASC2 6 - with mild troponin elevation and likely secondary to sepsis - pt also with known CAD - currently on cardizem and heparin drip - cardiology team following, assistance appreciated  - ECHO pending     Acute kidney injury, hyponatremia  - unclear etiology - will ask for bladder scan, and repeat BMP in AM - if Cr continues trending up, will get renal US    CAD with elevated troponin - added aspirin 325 mg today per cardiology and after that 81 mg PO QD  - cardiology also increased lipitor to 80 mg PO QD  - may need cath     Septic shock secondary to RSV bronchitis - treat COPD, supportive care  - empiric ABX Levaquin for now - lactic acid cleared     Hyperkalemia - resolved  - BMP In AM  DVT prophylaxis: On heparin drip  Code Status: No  CPR and no intubation but OK with medications per ACLS  Family Communication: Patient and wife at bedside  Disposition Plan: to be determined but likely home   Consultants:   PCCM  Cardiology   Procedures:   None  Antimicrobials:   Vancomycin 2/3 --> 2/4  Levaquin 2/3 -->   Subjective: Still with dyspnea and chest tightness.   Objective: Vitals:   07/16/16 0600 07/16/16 0700 07/16/16 0800 07/16/16 0826  BP: (!) 155/99 (!) 158/91    Pulse: 86 74    Resp: (!) 22 15    Temp:   97.7 F (36.5 C)   TempSrc:   Oral   SpO2: 95% 96%  95%  Weight:      Height:        Intake/Output Summary (Last 24 hours) at 07/16/16 1232 Last data filed at 07/16/16 0700  Gross per 24 hour  Intake           674.98 ml  Output             1300 ml  Net          -625.02 ml   Filed Weights   07/14/16 1443 07/16/16 0400  Weight: 70.7 kg (155 lb 13.8 oz) 71.2 kg (156 lb 15.5 oz)    Examination:  General exam: Appears to be in mild distress due to dyspnea.  Respiratory system: rales bilaterally with diffuse wheezing and tachypnea  Cardiovascular  system: IRRR. No JVD, rubs, gallops or clicks.  Gastrointestinal system: Abdomen is nondistended, soft and nontender. No organomegaly  Central nervous system: Alert and oriented. No focal neurological deficits. Psychiatry: Judgement and insight appear normal. Mood & affect appropriate.    Data Reviewed: I have personally reviewed following labs and imaging studies  CBC:  Recent Labs Lab 07/13/16 2254 07/13/16 2313 07/14/16 0210 07/15/16 0636 07/16/16 0222  WBC 16.6*  --  13.8* 12.2* 14.0*  HGB 11.2* 12.2* 10.3* 10.1* 9.7*  HCT 35.1* 36.0* 32.1* 31.4* 30.0*  MCV 90.2  --  90.2 90.2 90.4  PLT 220  --  188 181 409   Basic Metabolic Panel:  Recent Labs Lab 07/13/16 2254 07/13/16 2313 07/14/16 0210 07/15/16 0636 07/16/16 0222  NA 140 140 139 135 133*  K 3.9 3.9 3.8 5.3* 4.8  CL 101 104 103 102 102  CO2 24  --  21* 21* 22    GLUCOSE 153* 152* 232* 148* 156*  BUN 35* 36* 39* 49* 67*  CREATININE 1.29* 1.10 1.21 1.15 1.51*  CALCIUM 10.3  --  9.9 10.0 9.9   Cardiac Enzymes:  Recent Labs Lab 07/14/16 0210 07/14/16 0715 07/14/16 1322  TROPONINI 0.48* 2.69* 3.90*   CBG:  Recent Labs Lab 07/15/16 0816 07/15/16 1211 07/15/16 1722 07/15/16 2150 07/16/16 0843  GLUCAP 139* 178* 150* 155* 143*   Recent Results (from the past 240 hour(s))  Respiratory Panel by PCR     Status: Abnormal   Collection Time: 07/14/16 12:09 AM  Result Value Ref Range Status   Adenovirus NOT DETECTED NOT DETECTED Final   Coronavirus 229E NOT DETECTED NOT DETECTED Final   Coronavirus HKU1 NOT DETECTED NOT DETECTED Final   Coronavirus NL63 NOT DETECTED NOT DETECTED Final   Coronavirus OC43 NOT DETECTED NOT DETECTED Final   Metapneumovirus NOT DETECTED NOT DETECTED Final   Rhinovirus / Enterovirus NOT DETECTED NOT DETECTED Final   Influenza A NOT DETECTED NOT DETECTED Final   Influenza B NOT DETECTED NOT DETECTED Final   Parainfluenza Virus 1 NOT DETECTED NOT DETECTED Final   Parainfluenza Virus 2 NOT DETECTED NOT DETECTED Final   Parainfluenza Virus 3 NOT DETECTED NOT DETECTED Final   Parainfluenza Virus 4 NOT DETECTED NOT DETECTED Final   Respiratory Syncytial Virus DETECTED (A) NOT DETECTED Final    Comment: CRITICAL RESULT CALLED TO, READ BACK BY AND VERIFIED WITH: Adora Fridge AT 8119 07/15/16 BY L BENFIELD    Bordetella pertussis NOT DETECTED NOT DETECTED Final   Chlamydophila pneumoniae NOT DETECTED NOT DETECTED Final   Mycoplasma pneumoniae NOT DETECTED NOT DETECTED Final  Culture, blood (routine x 2) Call MD if unable to obtain prior to antibiotics being given     Status: None (Preliminary result)   Collection Time: 07/14/16  2:10 AM  Result Value Ref Range Status   Specimen Description BLOOD RIGHT ARM  Final   Special Requests BOTTLES DRAWN AEROBIC AND ANAEROBIC 5ML  Final   Culture NO GROWTH 1 DAY  Final   Report  Status PENDING  Incomplete  Culture, blood (routine x 2) Call MD if unable to obtain prior to antibiotics being given     Status: None (Preliminary result)   Collection Time: 07/14/16  2:15 AM  Result Value Ref Range Status   Specimen Description BLOOD RIGHT HAND  Final   Special Requests BOTTLES DRAWN AEROBIC AND ANAEROBIC 5ML  Final   Culture NO GROWTH 1 DAY  Final   Report Status PENDING  Incomplete  MRSA PCR Screening     Status: None   Collection Time: 07/14/16  2:37 PM  Result Value Ref Range Status   MRSA by PCR NEGATIVE NEGATIVE Final    Comment:        The GeneXpert MRSA Assay (FDA approved for NASAL specimens only), is one component of a comprehensive MRSA colonization surveillance program. It is not intended to diagnose MRSA infection nor to guide or monitor treatment for MRSA infections.   Culture, expectorated sputum-assessment     Status: None   Collection Time: 07/15/16  9:18 PM  Result Value Ref Range Status   Specimen Description SPUTUM  Final   Special Requests NONE  Final   Sputum evaluation THIS SPECIMEN IS ACCEPTABLE FOR SPUTUM CULTURE  Final   Report Status 07/16/2016 FINAL  Final  Culture, respiratory (NON-Expectorated)     Status: None (Preliminary result)   Collection Time: 07/15/16  9:18 PM  Result Value Ref Range Status   Specimen Description SPUTUM  Final   Special Requests NONE Reflexed from [id number]  Final   Gram Stain   Final    ABUNDANT WBC PRESENT, PREDOMINANTLY PMN MODERATE GRAM POSITIVE COCCI IN CLUSTERS FEW YEAST RARE GRAM POSITIVE RODS    Culture PENDING  Incomplete   Report Status PENDING  Incomplete    Radiology Studies: Dg Chest Port 1 View  Result Date: 07/16/2016 CLINICAL DATA:  Acute respiratory failure. Cough and shortness of breath and chest congestion. EXAM: PORTABLE CHEST 1 VIEW COMPARISON:  07/13/2016 and 08/14/2015 FINDINGS: Chronic cardiomegaly with aortic atherosclerosis. Pulmonary vascularity is normal and the lungs are  clear. No acute bone abnormality. Old fracture of the right clavicle. IMPRESSION: No acute abnormality.  Chronic cardiomegaly. Aortic atherosclerosis. Electronically Signed   By: Lorriane Shire M.D.   On: 07/16/2016 07:59      Scheduled Meds: . acetaminophen  1,000 mg Oral BID  . arformoterol  15 mcg Nebulization BID  . aspirin  81 mg Oral Daily  . atorvastatin  80 mg Oral q1800  . azelastine  2 spray Each Nare QHS  . budesonide (PULMICORT) nebulizer solution  0.5 mg Nebulization BID  . chlorpheniramine-HYDROcodone  5 mL Oral Q12H  . escitalopram  20 mg Oral QHS  . fenofibrate  160 mg Oral QHS  . fluticasone  2 spray Each Nare BID  . guaiFENesin  600 mg Oral BID  . insulin aspart  0-5 Units Subcutaneous QHS  . insulin aspart  0-9 Units Subcutaneous TID WC  . lamoTRIgine  150 mg Oral QHS  . levalbuterol  0.63 mg Nebulization Q6H  . levofloxacin (LEVAQUIN) IV  500 mg Intravenous Q48H  . loratadine  10 mg Oral Daily  . methylPREDNISolone (SOLU-MEDROL) injection  60 mg Intravenous Q6H  . multivitamin with minerals  1 tablet Oral QHS  . pantoprazole  40 mg Oral BID  . spironolactone  25 mg Oral Once per day on Mon Wed Fri  . verapamil  240 mg Oral QHS   Continuous Infusions: . diltiazem (CARDIZEM) infusion 5 mg/hr (07/16/16 0508)  . heparin 1,150 Units/hr (07/16/16 0431)     LOS: 2 days   Time spent: 20 minutes   Faye Ramsay, MD [facility] Pager [phone]  If 7PM-7AM, please contact night-coverage [email or web address] Password [facility] 07/16/2016, 12:32 PM

## 2016-07-16 NOTE — Progress Notes (Signed)
Peripherally Inserted Central Catheter/Midline Placement  The IV Nurse has discussed with the patient and/or persons authorized to consent for the patient, the purpose of this procedure and the potential benefits and risks involved with this procedure.  The benefits include less needle sticks, lab draws from the catheter, and the patient may be discharged home with the catheter. Risks include, but not limited to, infection, bleeding, blood clot (thrombus formation), and puncture of an artery; nerve damage and irregular heartbeat and possibility to perform a PICC exchange if needed/ordered by physician.  Alternatives to this procedure were also discussed.  Bard Power PICC patient education guide, fact sheet on infection prevention and patient information card has been provided to patient /or left at bedside.    PICC/Midline Placement Documentation  PICC Single Lumen 38/37/79 PICC Right Basilic 36 cm 0 cm (Active)  Indication for Insertion or Continuance of Line Poor Vasculature-patient has had multiple peripheral attempts or PIVs lasting less than 24 hours 07/16/2016  7:00 PM  Exposed Catheter (cm) 0 cm 07/16/2016  7:00 PM  Dressing Change Due 07/23/16 07/16/2016  7:00 PM       [place] 07/16/2016, 7:20 PM

## 2016-07-17 DIAGNOSIS — N179 Acute kidney failure, unspecified: Secondary | ICD-10-CM

## 2016-07-17 DIAGNOSIS — B338 Other specified viral diseases: Secondary | ICD-10-CM

## 2016-07-17 DIAGNOSIS — I340 Nonrheumatic mitral (valve) insufficiency: Secondary | ICD-10-CM

## 2016-07-17 DIAGNOSIS — N189 Chronic kidney disease, unspecified: Secondary | ICD-10-CM

## 2016-07-17 DIAGNOSIS — B974 Respiratory syncytial virus as the cause of diseases classified elsewhere: Secondary | ICD-10-CM

## 2016-07-17 DIAGNOSIS — I519 Heart disease, unspecified: Secondary | ICD-10-CM

## 2016-07-17 DIAGNOSIS — I481 Persistent atrial fibrillation: Secondary | ICD-10-CM

## 2016-07-17 LAB — CBC
HCT: 27.9 % — ABNORMAL LOW (ref 39.0–52.0)
Hemoglobin: 8.9 g/dL — ABNORMAL LOW (ref 13.0–17.0)
MCH: 28.9 pg (ref 26.0–34.0)
MCHC: 31.9 g/dL (ref 30.0–36.0)
MCV: 90.6 fL (ref 78.0–100.0)
PLATELETS: 192 10*3/uL (ref 150–400)
RBC: 3.08 MIL/uL — AB (ref 4.22–5.81)
RDW: 14.4 % (ref 11.5–15.5)
WBC: 10.9 10*3/uL — ABNORMAL HIGH (ref 4.0–10.5)

## 2016-07-17 LAB — BASIC METABOLIC PANEL WITH GFR
Anion gap: 10 (ref 5–15)
BUN: 78 mg/dL — ABNORMAL HIGH (ref 6–20)
CO2: 24 mmol/L (ref 22–32)
Calcium: 9.8 mg/dL (ref 8.9–10.3)
Chloride: 98 mmol/L — ABNORMAL LOW (ref 101–111)
Creatinine, Ser: 1.57 mg/dL — ABNORMAL HIGH (ref 0.61–1.24)
GFR calc Af Amer: 45 mL/min — ABNORMAL LOW
GFR calc non Af Amer: 39 mL/min — ABNORMAL LOW
Glucose, Bld: 148 mg/dL — ABNORMAL HIGH (ref 65–99)
Potassium: 5.5 mmol/L — ABNORMAL HIGH (ref 3.5–5.1)
Sodium: 132 mmol/L — ABNORMAL LOW (ref 135–145)

## 2016-07-17 LAB — POTASSIUM: POTASSIUM: 5.4 mmol/L — AB (ref 3.5–5.1)

## 2016-07-17 LAB — LEGIONELLA PNEUMOPHILA SEROGP 1 UR AG: L. pneumophila Serogp 1 Ur Ag: NEGATIVE

## 2016-07-17 LAB — GLUCOSE, CAPILLARY
GLUCOSE-CAPILLARY: 135 mg/dL — AB (ref 65–99)
Glucose-Capillary: 135 mg/dL — ABNORMAL HIGH (ref 65–99)
Glucose-Capillary: 207 mg/dL — ABNORMAL HIGH (ref 65–99)
Glucose-Capillary: 155 mg/dL — ABNORMAL HIGH (ref 65–99)

## 2016-07-17 MED ORDER — HYDRALAZINE HCL 20 MG/ML IJ SOLN
5.0000 mg | Freq: Four times a day (QID) | INTRAMUSCULAR | Status: DC | PRN
  Administered 2016-07-17: 5 mg via INTRAVENOUS
  Filled 2016-07-17: qty 1

## 2016-07-17 MED ORDER — LEVALBUTEROL HCL 0.63 MG/3ML IN NEBU
0.6300 mg | INHALATION_SOLUTION | Freq: Three times a day (TID) | RESPIRATORY_TRACT | Status: DC
  Administered 2016-07-18 (×2): 0.63 mg via RESPIRATORY_TRACT
  Filled 2016-07-17: qty 3

## 2016-07-17 MED ORDER — SENNOSIDES-DOCUSATE SODIUM 8.6-50 MG PO TABS
1.0000 | ORAL_TABLET | Freq: Two times a day (BID) | ORAL | Status: DC
  Administered 2016-07-17 (×2): 1 via ORAL
  Filled 2016-07-17 (×2): qty 1

## 2016-07-17 MED ORDER — SODIUM POLYSTYRENE SULFONATE 15 GM/60ML PO SUSP
15.0000 g | Freq: Once | ORAL | Status: AC
  Administered 2016-07-17: 15 g via ORAL
  Filled 2016-07-17: qty 60

## 2016-07-17 NOTE — Progress Notes (Signed)
Tapered Cardizem drip down until discontinued

## 2016-07-17 NOTE — Plan of Care (Signed)
Problem: Education: Goal: Knowledge of [place] [facility] Education information/materials will improve Outcome: Progressing Discussed with patient and wife about his increased blood pressure and potassium with measures being done to correct it with some teach back displayed.

## 2016-07-17 NOTE — Progress Notes (Signed)
RT placed pt on RA due to stable sats 

## 2016-07-17 NOTE — Progress Notes (Addendum)
PROGRESS NOTE    Eduardo [name]  YTK:[id number] DOB: 08-Mar-1931 DOA: 07/13/2016  PCP: [name], MD   Brief Narrative:  83 year old male w/ known h/o Chronic respiratory failure in setting of COPD (f/b [place]). Admitted 2/3 w/ acute bronchitis, AECOPD and resultant acute on chronic respiratory failure   Assessment & Plan:   Principal Problem:   Acute on chronic respiratory failure with hypoxia (HCC), RSV positive  - pt with known advanced COPD and oxygen dependent at baseline, failed recent treatment with doxycycline and prednisone in an outpatient setting and now admitted for worsening acute COPD exacerbation with acute bronchitis  - seems bit better this AM but still with diffuse wheezing and rales bilaterally  - continue to monitor in SDU - continue BD's scheduled and as needed - increased frequency of steroids and changed to decadron per PCCM and no plan to taper down yet until resp status improves  - continue antitussives as needed  - flutter valve ordered and pt using so far Q2 hours when awake  - today is day #4/5 of Levaquin, vanc was discontinued 2/4 - PCCM following, assistance appreciated   Active Problems:   Atrial fibrillation with rapid ventricular response ([place]) - CHADS2VASC2 6 - with mild troponin elevation and likely secondary to sepsis rather than ACS pattern  - pt also with known CAD - HR stable so far and due to mild bradycardia, plan to take off Cardizem drip, continue to monitor HR on Verapamil  - per cardiology, verapamil is not an ideal choice but no BB given severe COPD - continue Eliquis  - cardiology team following, assistance appreciated     New LV dysfunction/moderate-severe MR - MR could be contributing to LV dysfunction - not a good candidate for open surgical procedure - hold ACEI for now given AKI on CKD - no over pulmonary edema on exam     Acute kidney injury, hyponatremia  - unclear etiology, noted on blood work 2/5 - BMP  pending this AM     CAD with elevated troponin - added aspirin 325 mg today per cardiology and after that 81 mg PO QD  - cardiology also increased lipitor to 80 mg PO QD  - may need cath but this is now on hold in the setting of acute COPD     Septic shock secondary to RSV bronchitis - treat COPD, supportive care  - empiric ABX Levaquin for now, day #4/5 - lactic acid cleared     Hyperkalemia - BMP pending this AM   DVT prophylaxis: On heparin drip  Code Status: No CPR and no intubation but OK with medications per ACLS  Family Communication: Patient and daughter at bedside  Disposition Plan: to be determined but likely home   Consultants:   PCCM  Cardiology   Procedures:   None  Antimicrobials:   Vancomycin 2/3 --> 2/4  Levaquin 2/3 -->   Subjective: Still with dyspnea and chest tightness.   Objective: Vitals:   07/17/16 0752 07/17/16 0852 07/17/16 0939 07/17/16 1215  BP: (!) 147/68   (!) 151/79  Pulse: 65  (!) 53 (!) 54  Resp: 16  (!) 21 14  Temp: 98 F (36.7 C)   97.6 F (36.4 C)  TempSrc: Oral   Axillary  SpO2: 92% 100% 92% 97%  Weight:      Height:        Intake/Output Summary (Last 24 hours) at 07/17/16 1329 Last data filed at 07/17/16 0600  Gross  per 24 hour  Intake           658.33 ml  Output             2300 ml  Net         -1641.67 ml   Filed Weights   07/14/16 1443 07/16/16 0400  Weight: 70.7 kg (155 lb 13.8 oz) 71.2 kg (156 lb 15.5 oz)    Examination:  General exam: Appears to be in mild distress due to dyspnea.  Respiratory system: rales bilaterally with diffuse wheezing and tachypnea  Cardiovascular system: IRRR. No JVD, rubs, gallops or clicks.  Gastrointestinal system: Abdomen is nondistended, soft and nontender. No organomegaly  Central nervous system: Alert and oriented. No focal neurological deficits. Psychiatry: Judgement and insight appear normal. Mood & affect appropriate.    Data Reviewed: I have personally reviewed  following labs and imaging studies  CBC:  Recent Labs Lab 07/13/16 2254 07/13/16 2313 07/14/16 0210 07/15/16 0636 07/16/16 0222 07/17/16 0500  WBC 16.6*  --  13.8* 12.2* 14.0* 10.9*  HGB 11.2* 12.2* 10.3* 10.1* 9.7* 8.9*  HCT 35.1* 36.0* 32.1* 31.4* 30.0* 27.9*  MCV 90.2  --  90.2 90.2 90.4 90.6  PLT 220  --  188 181 201 009   Basic Metabolic Panel:  Recent Labs Lab 07/13/16 2254 07/13/16 2313 07/14/16 0210 07/15/16 0636 07/16/16 0222  NA 140 140 139 135 133*  K 3.9 3.9 3.8 5.3* 4.8  CL 101 104 103 102 102  CO2 24  --  21* 21* 22  GLUCOSE 153* 152* 232* 148* 156*  BUN 35* 36* 39* 49* 67*  CREATININE 1.29* 1.10 1.21 1.15 1.51*  CALCIUM 10.3  --  9.9 10.0 9.9   Cardiac Enzymes:  Recent Labs Lab 07/14/16 0210 07/14/16 0715 07/14/16 1322  TROPONINI 0.48* 2.69* 3.90*   CBG:  Recent Labs Lab 07/16/16 1335 07/16/16 1641 07/16/16 2216 07/17/16 0750 07/17/16 1214  GLUCAP 136* 146* 150* 135* 207*   Recent Results (from the past 240 hour(s))  Respiratory Panel by PCR     Status: Abnormal   Collection Time: 07/14/16 12:09 AM  Result Value Ref Range Status   Adenovirus NOT DETECTED NOT DETECTED Final   Coronavirus 229E NOT DETECTED NOT DETECTED Final   Coronavirus HKU1 NOT DETECTED NOT DETECTED Final   Coronavirus NL63 NOT DETECTED NOT DETECTED Final   Coronavirus OC43 NOT DETECTED NOT DETECTED Final   Metapneumovirus NOT DETECTED NOT DETECTED Final   Rhinovirus / Enterovirus NOT DETECTED NOT DETECTED Final   Influenza A NOT DETECTED NOT DETECTED Final   Influenza B NOT DETECTED NOT DETECTED Final   Parainfluenza Virus 1 NOT DETECTED NOT DETECTED Final   Parainfluenza Virus 2 NOT DETECTED NOT DETECTED Final   Parainfluenza Virus 3 NOT DETECTED NOT DETECTED Final   Parainfluenza Virus 4 NOT DETECTED NOT DETECTED Final   Respiratory Syncytial Virus DETECTED (A) NOT DETECTED Final    Comment: CRITICAL RESULT CALLED TO, READ BACK BY AND VERIFIED WITH: Adora Fridge AT 2330 07/15/16 BY L BENFIELD    Bordetella pertussis NOT DETECTED NOT DETECTED Final   Chlamydophila pneumoniae NOT DETECTED NOT DETECTED Final   Mycoplasma pneumoniae NOT DETECTED NOT DETECTED Final  Culture, blood (routine x 2) Call MD if unable to obtain prior to antibiotics being given     Status: None (Preliminary result)   Collection Time: 07/14/16  2:10 AM  Result Value Ref Range Status   Specimen Description BLOOD RIGHT ARM  Final  Special Requests BOTTLES DRAWN AEROBIC AND ANAEROBIC 5ML  Final   Culture NO GROWTH 2 DAYS  Final   Report Status PENDING  Incomplete  Culture, blood (routine x 2) Call MD if unable to obtain prior to antibiotics being given     Status: None (Preliminary result)   Collection Time: 07/14/16  2:15 AM  Result Value Ref Range Status   Specimen Description BLOOD RIGHT HAND  Final   Special Requests BOTTLES DRAWN AEROBIC AND ANAEROBIC 5ML  Final   Culture NO GROWTH 2 DAYS  Final   Report Status PENDING  Incomplete  MRSA PCR Screening     Status: None   Collection Time: 07/14/16  2:37 PM  Result Value Ref Range Status   MRSA by PCR NEGATIVE NEGATIVE Final    Comment:        The GeneXpert MRSA Assay (FDA approved for NASAL specimens only), is one component of a comprehensive MRSA colonization surveillance program. It is not intended to diagnose MRSA infection nor to guide or monitor treatment for MRSA infections.   Culture, expectorated sputum-assessment     Status: None   Collection Time: 07/15/16  9:18 PM  Result Value Ref Range Status   Specimen Description SPUTUM  Final   Special Requests NONE  Final   Sputum evaluation THIS SPECIMEN IS ACCEPTABLE FOR SPUTUM CULTURE  Final   Report Status 07/16/2016 FINAL  Final  Culture, respiratory (NON-Expectorated)     Status: None (Preliminary result)   Collection Time: 07/15/16  9:18 PM  Result Value Ref Range Status   Specimen Description SPUTUM  Final   Special Requests NONE Reflexed from  [id number]  Final   Gram Stain   Final    ABUNDANT WBC PRESENT, PREDOMINANTLY PMN MODERATE GRAM POSITIVE COCCI IN CLUSTERS FEW YEAST RARE GRAM POSITIVE RODS    Culture CULTURE REINCUBATED FOR BETTER GROWTH  Final   Report Status PENDING  Incomplete    Radiology Studies: Dg Chest Port 1 View  Result Date: 07/16/2016 CLINICAL DATA:  PICC line placement EXAM: PORTABLE CHEST 1 VIEW COMPARISON:  07/16/2016 CXR FINDINGS: Stable cardiomegaly with aortic atherosclerosis. New right-sided PICC line tip in the distal SVC. Atelectasis at the left lung base. No pneumonic consolidation or overt pulmonary edema. Probable tiny left pleural effusion. No suspicious osseous abnormalities. Old right mid clavicular fracture. Calcific tendinopathy of the left rotator cuff. IMPRESSION: 1. PICC line tip in the distal SVC. 2. Stable cardiomegaly with aortic atherosclerosis. 3. Left basilar atelectasis with probable trace left effusion. Electronically Signed   By: Ashley Royalty M.D.   On: 07/16/2016 21:01   Dg Chest Port 1 View  Result Date: 07/16/2016 CLINICAL DATA:  Acute respiratory failure. Cough and shortness of breath and chest congestion. EXAM: PORTABLE CHEST 1 VIEW COMPARISON:  07/13/2016 and 08/14/2015 FINDINGS: Chronic cardiomegaly with aortic atherosclerosis. Pulmonary vascularity is normal and the lungs are clear. No acute bone abnormality. Old fracture of the right clavicle. IMPRESSION: No acute abnormality.  Chronic cardiomegaly. Aortic atherosclerosis. Electronically Signed   By: Lorriane Shire M.D.   On: 07/16/2016 07:59      Scheduled Meds: . acetaminophen  1,000 mg Oral BID  . apixaban  2.5 mg Oral BID  . arformoterol  15 mcg Nebulization BID  . aspirin  81 mg Oral Daily  . atorvastatin  80 mg Oral q1800  . azelastine  2 spray Each Nare QHS  . budesonide (PULMICORT) nebulizer solution  0.5 mg Nebulization BID  . chlorpheniramine-HYDROcodone  5 mL Oral Q12H  . dexamethasone  6 mg Intravenous Q6H  .  escitalopram  20 mg Oral QHS  . fenofibrate  160 mg Oral QHS  . fluticasone  2 spray Each Nare BID  . guaiFENesin  600 mg Oral BID  . insulin aspart  0-5 Units Subcutaneous QHS  . insulin aspart  0-9 Units Subcutaneous TID WC  . lamoTRIgine  150 mg Oral QHS  . levalbuterol  0.63 mg Nebulization Q6H  . levofloxacin (LEVAQUIN) IV  500 mg Intravenous Q48H  . loratadine  10 mg Oral Daily  . multivitamin with minerals  1 tablet Oral QHS  . pantoprazole  40 mg Oral BID  . senna-docusate  1 tablet Oral BID  . spironolactone  25 mg Oral Once per day on Mon Wed Fri  . verapamil  240 mg Oral QHS   Continuous Infusions:    LOS: 3 days   Time spent: 20 minutes   Faye Ramsay, MD [facility] Pager [phone]  If 7PM-7AM, please contact night-coverage [email or web address] Password [email or web address] 07/17/2016, 1:29 PM

## 2016-07-17 NOTE — Progress Notes (Signed)
Progress Note  Patient Name: Eduardo [name] Date of Encounter: 07/17/2016  Primary Cardiologist: Pernell Dupre  Subjective   Still with junky-sounding congestion and dyspnea. No chest pain. No palpitations. No dizziness.  Inpatient Medications    Scheduled Meds: . acetaminophen  1,000 mg Oral BID  . apixaban  2.5 mg Oral BID  . arformoterol  15 mcg Nebulization BID  . aspirin  81 mg Oral Daily  . atorvastatin  80 mg Oral q1800  . azelastine  2 spray Each Nare QHS  . budesonide (PULMICORT) nebulizer solution  0.5 mg Nebulization BID  . chlorpheniramine-HYDROcodone  5 mL Oral Q12H  . dexamethasone  6 mg Intravenous Q6H  . escitalopram  20 mg Oral QHS  . fenofibrate  160 mg Oral QHS  . fluticasone  2 spray Each Nare BID  . guaiFENesin  600 mg Oral BID  . insulin aspart  0-5 Units Subcutaneous QHS  . insulin aspart  0-9 Units Subcutaneous TID WC  . lamoTRIgine  150 mg Oral QHS  . levalbuterol  0.63 mg Nebulization Q6H  . levofloxacin (LEVAQUIN) IV  500 mg Intravenous Q48H  . loratadine  10 mg Oral Daily  . multivitamin with minerals  1 tablet Oral QHS  . pantoprazole  40 mg Oral BID  . senna-docusate  1 tablet Oral BID  . spironolactone  25 mg Oral Once per day on Mon Wed Fri  . verapamil  240 mg Oral QHS   Continuous Infusions: . diltiazem (CARDIZEM) infusion 15 mg/hr (07/17/16 0520)   PRN Meds: acetaminophen **OR** acetaminophen, guaiFENesin-dextromethorphan, levalbuterol, levalbuterol, ondansetron **OR** ondansetron (ZOFRAN) IV, pneumococcal 23 valent vaccine, sodium chloride flush, triamcinolone cream   Vital Signs    Vitals:   07/17/16 0700 07/17/16 0752 07/17/16 0852 07/17/16 0939  BP: (!) 147/68 (!) 147/68    Pulse: 62 65  (!) 53  Resp: 11 16  (!) 21  Temp:  98 F (36.7 C)    TempSrc:  Oral    SpO2: 94% 92% 100% 92%  Weight:      Height:        Intake/Output Summary (Last 24 hours) at 07/17/16 1139 Last data filed at 07/17/16 0600  Gross per 24 hour   Intake           658.33 ml  Output             2300 ml  Net         -1641.67 ml   Filed Weights   07/14/16 1443 07/16/16 0400  Weight: 155 lb 13.8 oz (70.7 kg) 156 lb 15.5 oz (71.2 kg)    Telemetry    Atrial fib - rates 70s but occasionally dipping into 40s even during day - Personally Reviewed   Physical Exam   GEN: No acute distress, audible congested exhalation HEENT: Normocephalic, atraumatic, sclera non-icteric. Neck: No JVD or bruits. Cardiac: Irregularly irregular, rate controlled, no murmurs, rubs, or gallops.  Radials/DP/PT 1+ and equal bilaterally.  Respiratory: Diffuse rhonchi and wheezing. Breathing is unlabored. GI: Soft, nontender, non-distended, BS +x 4. MS: no deformity. Extremities: No clubbing or cyanosis. No edema. Distal pedal pulses are 2+ and equal bilaterally. Neuro:  AAOx3. Follows commands. Psych:  Responds to questions appropriately with a normal affect.  Labs    Chemistry Recent Labs Lab 07/14/16 0210 07/15/16 0636 07/16/16 0222  NA 139 135 133*  K 3.8 5.3* 4.8  CL 103 102 102  CO2 21* 21* 22  GLUCOSE 232* 148*  156*  BUN 39* 49* 67*  CREATININE 1.21 1.15 1.51*  CALCIUM 9.9 10.0 9.9  GFRNONAA 54* 57* 41*  GFRAA >60 >60 47*  ANIONGAP 15 12 9      Hematology Recent Labs Lab 07/15/16 0636 07/16/16 0222 07/17/16 0500  WBC 12.2* 14.0* 10.9*  RBC 3.48* 3.32* 3.08*  HGB 10.1* 9.7* 8.9*  HCT 31.4* 30.0* 27.9*  MCV 90.2 90.4 90.6  MCH 29.0 29.2 28.9  MCHC 32.2 32.3 31.9  RDW 14.8 14.5 14.4  PLT 181 201 192    Cardiac Enzymes Recent Labs Lab 07/14/16 0210 07/14/16 0715 07/14/16 1322  TROPONINI 0.48* 2.69* 3.90*    Recent Labs Lab 07/13/16 2311 07/14/16 0241  TROPIPOC 0.00 0.57*     BNP Recent Labs Lab 07/13/16 2303  BNP 261.9*     DDimer No results for input(s): DDIMER in the last 168 hours.   Radiology    Dg Chest Port 1 View  Result Date: 07/16/2016 CLINICAL DATA:  PICC line placement EXAM: PORTABLE CHEST  1 VIEW COMPARISON:  07/16/2016 CXR FINDINGS: Stable cardiomegaly with aortic atherosclerosis. New right-sided PICC line tip in the distal SVC. Atelectasis at the left lung base. No pneumonic consolidation or overt pulmonary edema. Probable tiny left pleural effusion. No suspicious osseous abnormalities. Old right mid clavicular fracture. Calcific tendinopathy of the left rotator cuff. IMPRESSION: 1. PICC line tip in the distal SVC. 2. Stable cardiomegaly with aortic atherosclerosis. 3. Left basilar atelectasis with probable trace left effusion. Electronically Signed   By: Ashley Royalty M.D.   On: 07/16/2016 21:01   Dg Chest Port 1 View  Result Date: 07/16/2016 CLINICAL DATA:  Acute respiratory failure. Cough and shortness of breath and chest congestion. EXAM: PORTABLE CHEST 1 VIEW COMPARISON:  07/13/2016 and 08/14/2015 FINDINGS: Chronic cardiomegaly with aortic atherosclerosis. Pulmonary vascularity is normal and the lungs are clear. No acute bone abnormality. Old fracture of the right clavicle. IMPRESSION: No acute abnormality.  Chronic cardiomegaly. Aortic atherosclerosis. Electronically Signed   By: Lorriane Shire M.D.   On: 07/16/2016 07:59    Cardiac Studies   2D Echo 07/15/16 - Left ventricle: The cavity size was normal. Wall thickness was   increased in a pattern of mild LVH. Systolic function was mildly   reduced. The estimated ejection fraction was in the range of 45%   to 50%. Mild diffuse hypokinesis with no identifiable regional   variations. Doppler parameters are consistent with elevated mean   left atrial filling pressure. - Aortic valve: Valve mobility was mildly restricted. There was   mild stenosis. Valve area (VTI): 1.5 cm^2. Valve area (Vmax): 1.6   cm^2. Valve area (Vmean): 1.49 cm^2. - Mitral valve: Severely calcified annulus. Mildly thickened   leaflets . The findings are consistent with mild stenosis. There   was moderate to severe regurgitation. Valve area by continuity    equation (using LVOT flow): 1.61 cm^2. - Left atrium: The atrium was severely dilated. - Right ventricle: The cavity size was mildly dilated. Systolic   function was mildly reduced. - Right atrium: The atrium was severely dilated. - Tricuspid valve: There was mild-moderate regurgitation directed   centrally. - Pulmonary arteries: Systolic pressure was mildly increased. PA   peak pressure: 50 mm Hg (S).  Patient Profile     83 y.o. male with CAD (cath 2012 c/b dissection of RCA with ST elevation/temp pacer, medically managed), chronic atrial fib on Eliquis, chronic respiratory failure/COPD, HLD and CVA admitted with dyspnea, cough and wheezing, found to  have AECOPD and + RSV. Troponin was elevated to 3.9.  Assessment & Plan    1. AECOPD/RSV - still with significant adventitious lung sounds. Per pulm.  2. CAD/elevated troponin - Pt with known moderate CAD by cath in January 2012. This was complicated by dissection of the ostial RCA. He has done well with medical management since then. Now with elevated troponin in the setting of COPD exacerbation and rapid atrial fib. Echo 07/15/16 with mild global LV systolic dysfunction. Dr. Angelena Form feels there is not a a clear cut indication for cardiac cath right now. His elevated troponin is likely due to demand ischemia as he has diffuse moderate CAD with heavily calcified coronaries by cath 6 years ago. Recommend continued medical management, reserving cath if he develops angina. Statin was titrated - add baseline LFTs to AM labs.  3. New LV dysfunction/moderate-severe MR - MR could be contributing to LV dysfunction. He is not a good candidate for open surgical procedure. Will follow. Hold off ACEI for now given AKI on CKD. CXR yesterday with tiny left pleural effusion, no overt pulmonary edema.  4. AKI on probable mild CKD - BMET ordered for tomorrow per IM.  5. Persistent atrial fib - rate now controlled on PO verapamil and diltiazem drip. Rate was  actually somewhat slow this AM in the 40s. Will d/c diltiazem drip and follow HR on verapamil. Check TSH in AM. Verapamil is not ideal choice given his LV dysfunction but I do not suspect he will tolerate BB given his severe COPD. Pharmacy has adjusted dosing of his Eliquis. Will need to follow renal function to determine if his home dose needs to be changed permanently.  6. Anemia - per IM. Will need to be followed given his anticoagulation.  Signed, Charlie Pitter, PA-C  07/17/2016, 11:39 AM    I have personally seen and examined this patient with Melina Copa, PA-C. I agree with the assessment and plan as outlined above. His dyspnea seems to be mostly driven by his lung disease. Volume status is ok. No plans for invasive cardiac evaluation. He is not a candidate for open thoracic surgery so will follow MR. Stop Diltiazem drip today. Continue verapamil.   Lauree Chandler 07/17/2016 12:34 PM

## 2016-07-17 NOTE — Progress Notes (Addendum)
Paged morning MD family and patient requesting something for constipation / stool softner.  Will have first shift RN follow-up.

## 2016-07-17 NOTE — Progress Notes (Signed)
Physical Therapy Treatment Patient Details Name: Eduardo [name] MRN: [id number] DOB: Oct 04, 1930 Today's Date: 07/17/2016    History of Present Illness Patient is an 83 yo male admitted 07/13/16 with acute resp failure with hypoxia.  CHF/COPD exacerbation, Afib, elevated troponin (per chart demand ischemia), and hyperkalemia.    PMH:  CHF, COPD, HLD, anemia, anxiety, Afib, CAD, MI, CVA, neuropathy, CKD    PT Comments    Pt's mobility limited by SOB although vitals WNL during activity.  Needs cues to breathe through nose rather than his mouth.  Pt appears to be motivated and has very supportive family. Will continue to follow patient while on this venue of care to progress mobility   Follow Up Recommendations  Outpatient PT;Supervision for mobility/OOB     Equipment Recommendations  None recommended by PT    Recommendations for Other Services       Precautions / Restrictions Precautions Precautions: Fall Restrictions Weight Bearing Restrictions: No    Mobility  Bed Mobility Overal bed mobility: Needs Assistance Bed Mobility: Supine to Sit;Sit to Supine     Supine to sit: Supervision;HOB elevated Sit to supine: Supervision;HOB elevated   General bed mobility comments: min cues for sequencing  Transfers Overall transfer level: Needs assistance Equipment used: Rolling walker (2 wheeled) Transfers: Sit to/from Stand Sit to Stand: Min assist         General transfer comment: mod cues for hand placement during sit to/from stand for safety  Ambulation/Gait Ambulation/Gait assistance: Min assist;+2 safety/equipment Ambulation Distance (Feet): 40 Feet (x 2 reps) Assistive device: Rolling walker (2 wheeled) Gait Pattern/deviations: Decreased step length - right;Decreased step length - left;Decreased dorsiflexion - right;Decreased dorsiflexion - left;Narrow base of support (cues for hand placement and walker placement)     General Gait Details: follow with recliner for  safety, occasional standing rest breaks to manage fatigue/SOB   Stairs            Wheelchair Mobility    Modified Rankin (Stroke Patients Only)       Balance Overall balance assessment: Needs assistance Sitting-balance support: Bilateral upper extremity supported;Feet supported Sitting balance-Leahy Scale: Fair     Standing balance support: Bilateral upper extremity supported Standing balance-Leahy Scale: Poor                      Cognition Arousal/Alertness: Awake/alert Behavior During Therapy: WFL for tasks assessed/performed;Anxious Overall Cognitive Status: Within Functional Limits for tasks assessed                      Exercises      General Comments General comments (skin integrity, edema, etc.): utilized 3 L oxygen via [place] during session.  Purplish bruising Rt forearm, lt arm      Pertinent Vitals/Pain Pain Assessment: No/denies pain    Home Living                      Prior Function            PT Goals (current goals can now be found in the care plan section) Acute Rehab PT Goals Patient Stated Goal: return home, breathe better PT Goal Formulation: With patient/family Time For Goal Achievement: 07/22/16 Potential to Achieve Goals: Good Progress towards PT goals: Progressing toward goals    Frequency    Min 3X/week      PT Plan Current plan remains appropriate    Co-evaluation  End of Session Equipment Utilized During Treatment: Gait belt;Oxygen Activity Tolerance: Patient tolerated treatment well Patient left: in bed;with call bell/phone within reach;with family/visitor present     Time: 1459-1535 PT Time Calculation (min) (ACUTE ONLY): 36 min  Charges:  $Gait Training: 23-37 mins                    G CodesSuszanne Finch, PT [phone]  [place] 07/17/2016, 4:31 PM

## 2016-07-17 NOTE — Progress Notes (Signed)
Patient is no longer on droplet precautions. Patient is positive for RSV which does not require isolation precautions per infection prevention department

## 2016-07-18 DIAGNOSIS — B974 Respiratory syncytial virus as the cause of diseases classified elsewhere: Secondary | ICD-10-CM

## 2016-07-18 DIAGNOSIS — I519 Heart disease, unspecified: Secondary | ICD-10-CM

## 2016-07-18 DIAGNOSIS — R739 Hyperglycemia, unspecified: Secondary | ICD-10-CM

## 2016-07-18 DIAGNOSIS — R5381 Other malaise: Secondary | ICD-10-CM

## 2016-07-18 DIAGNOSIS — I5032 Chronic diastolic (congestive) heart failure: Secondary | ICD-10-CM

## 2016-07-18 LAB — HEPATIC FUNCTION PANEL
ALBUMIN: 3.6 g/dL (ref 3.5–5.0)
ALK PHOS: 55 U/L (ref 38–126)
ALT: 47 U/L (ref 17–63)
AST: 54 U/L — ABNORMAL HIGH (ref 15–41)
BILIRUBIN INDIRECT: 0.6 mg/dL (ref 0.3–0.9)
Bilirubin, Direct: 0.2 mg/dL (ref 0.1–0.5)
Total Bilirubin: 0.8 mg/dL (ref 0.3–1.2)
Total Protein: 6.5 g/dL (ref 6.5–8.1)

## 2016-07-18 LAB — CBC
HCT: 28.4 % — ABNORMAL LOW (ref 39.0–52.0)
HEMOGLOBIN: 9.1 g/dL — AB (ref 13.0–17.0)
MCH: 28.7 pg (ref 26.0–34.0)
MCHC: 32 g/dL (ref 30.0–36.0)
MCV: 89.6 fL (ref 78.0–100.0)
PLATELETS: 203 10*3/uL (ref 150–400)
RBC: 3.17 MIL/uL — ABNORMAL LOW (ref 4.22–5.81)
RDW: 14.3 % (ref 11.5–15.5)
WBC: 11.3 10*3/uL — ABNORMAL HIGH (ref 4.0–10.5)

## 2016-07-18 LAB — BASIC METABOLIC PANEL
Anion gap: 8 (ref 5–15)
BUN: 66 mg/dL — AB (ref 6–20)
CALCIUM: 9.7 mg/dL (ref 8.9–10.3)
CHLORIDE: 99 mmol/L — AB (ref 101–111)
CO2: 27 mmol/L (ref 22–32)
CREATININE: 1.23 mg/dL (ref 0.61–1.24)
GFR, EST NON AFRICAN AMERICAN: 52 mL/min — AB (ref >60)
Glucose, Bld: 141 mg/dL — ABNORMAL HIGH (ref 65–99)
Potassium: 5 mmol/L (ref 3.5–5.1)
SODIUM: 134 mmol/L — AB (ref 135–145)

## 2016-07-18 LAB — GLUCOSE, CAPILLARY
GLUCOSE-CAPILLARY: 156 mg/dL — AB (ref 65–99)
GLUCOSE-CAPILLARY: 164 mg/dL — AB (ref 65–99)
Glucose-Capillary: 149 mg/dL — ABNORMAL HIGH (ref 65–99)
Glucose-Capillary: 132 mg/dL — ABNORMAL HIGH (ref 65–99)

## 2016-07-18 LAB — MAGNESIUM: MAGNESIUM: 2.5 mg/dL — AB (ref 1.7–2.4)

## 2016-07-18 LAB — CULTURE, RESPIRATORY W GRAM STAIN: Culture: NORMAL

## 2016-07-18 LAB — CULTURE, RESPIRATORY

## 2016-07-18 LAB — TSH: TSH: 0.133 u[IU]/mL — AB (ref 0.350–4.500)

## 2016-07-18 LAB — PHOSPHORUS: Phosphorus: 3.7 mg/dL (ref 2.5–4.6)

## 2016-07-18 MED ORDER — BISACODYL 10 MG RE SUPP
10.0000 mg | Freq: Once | RECTAL | Status: AC
  Administered 2016-07-18: 10 mg via RECTAL
  Filled 2016-07-18: qty 1

## 2016-07-18 MED ORDER — DEXAMETHASONE SODIUM PHOSPHATE 10 MG/ML IJ SOLN: 6.0000 mg | Freq: Two times a day (BID) | INTRAMUSCULAR | Status: DC

## 2016-07-18 MED ORDER — DEXAMETHASONE SODIUM PHOSPHATE 10 MG/ML IJ SOLN
4.0000 mg | Freq: Two times a day (BID) | INTRAMUSCULAR | Status: DC
  Administered 2016-07-18: 4 mg via INTRAVENOUS
  Filled 2016-07-18 (×2): qty 0.4

## 2016-07-18 MED ORDER — APIXABAN 5 MG PO TABS
5.0000 mg | ORAL_TABLET | Freq: Two times a day (BID) | ORAL | Status: DC
  Administered 2016-07-18 – 2016-07-20 (×4): 5 mg via ORAL
  Filled 2016-07-18 (×4): qty 1

## 2016-07-18 MED ORDER — HYDRALAZINE HCL 20 MG/ML IJ SOLN
10.0000 mg | Freq: Four times a day (QID) | INTRAMUSCULAR | Status: DC | PRN
  Administered 2016-07-18 (×2): 10 mg via INTRAVENOUS
  Filled 2016-07-18 (×2): qty 1

## 2016-07-18 MED ORDER — MILK AND MOLASSES ENEMA
1.0000 | Freq: Once | RECTAL | Status: DC
  Filled 2016-07-18: qty 250

## 2016-07-18 MED ORDER — POLYETHYLENE GLYCOL 3350 17 G PO PACK
17.0000 g | PACK | Freq: Every day | ORAL | Status: DC
  Administered 2016-07-18 – 2016-07-20 (×3): 17 g via ORAL
  Filled 2016-07-18 (×3): qty 1

## 2016-07-18 MED ORDER — SENNOSIDES-DOCUSATE SODIUM 8.6-50 MG PO TABS
2.0000 | ORAL_TABLET | Freq: Every day | ORAL | Status: DC
  Administered 2016-07-18 – 2016-07-20 (×3): 2 via ORAL
  Filled 2016-07-18 (×3): qty 2

## 2016-07-18 NOTE — Progress Notes (Signed)
Name: Eduardo [name] MRN: [id number] DOB: 1930-10-26    ADMISSION DATE:  07/13/2016 CONSULTATION DATE:  2/3  REFERRING MD :  Doyle Askew   CHIEF COMPLAINT:  Acute on chronic respiratory failure   BRIEF PATIENT DESCRIPTION:  83 year old male w/ known h/o Chronic respiratory failure in setting of COPD (f/b [place]). Admitted 2/3 w/ acute bronchitis, AECOPD and resultant acute on chronic respiratory failure   SUBJECTIVE:  Pt reports improvement in wheezing, feels better overall.    VITAL SIGNS: Temp:  [97.6 F (36.4 C)-98.9 F (37.2 C)] 98.3 F (36.8 C) (02/07 1203) Pulse Rate:  [39-96] 80 (02/07 0722) Resp:  [10-19] 18 (02/07 0722) BP: (123-193)/(68-116) 160/81 (02/07 1305) SpO2:  [94 %-100 %] 98 % (02/07 0806)  PHYSICAL EXAMINATION: General:  Elderly male in NAD  HEENT: MM pink/moist, no jvd, upper airway wheeze PSY: normal mood / affect Neuro: AAOx4, speech clear, MAE CV: s1s2 rrr, no m/r/g PULM: even/non-labored, lungs bilaterally with soft wheeze TI:RWER, non-tender, bsx4 active  Extremities: warm/dry, no edema  Skin: no rashes or lesions   Recent Labs Lab 07/16/16 0222 07/17/16 1605 07/17/16 2024 07/18/16 0430  NA 133* 132*  --  134*  K 4.8 5.5* 5.4* 5.0  CL 102 98*  --  99*  CO2 22 24  --  27  BUN 67* 78*  --  66*  CREATININE 1.51* 1.57*  --  1.23  GLUCOSE 156* 148*  --  141*    Recent Labs Lab 07/14/16 0244 07/14/16 0715 07/14/16 1322 07/15/16 0636 07/16/16 0222 07/17/16 0500 07/18/16 0430  WBC  --   --   --  12.2* 14.0* 10.9* 11.3*  LATICACIDVEN 5.29* 3.0* 1.5  --   --   --   --       Recent Labs Lab 07/16/16 0222 07/17/16 0500 07/18/16 0430  HGB 9.7* 8.9* 9.1*  HCT 30.0* 27.9* 28.4*  WBC 14.0* 10.9* 11.3*  PLT 201 192 203   Dg Chest Port 1 View  Result Date: 07/16/2016 CLINICAL DATA:  PICC line placement EXAM: PORTABLE CHEST 1 VIEW COMPARISON:  07/16/2016 CXR FINDINGS: Stable cardiomegaly with aortic atherosclerosis. New right-sided  PICC line tip in the distal SVC. Atelectasis at the left lung base. No pneumonic consolidation or overt pulmonary edema. Probable tiny left pleural effusion. No suspicious osseous abnormalities. Old right mid clavicular fracture. Calcific tendinopathy of the left rotator cuff. IMPRESSION: 1. PICC line tip in the distal SVC. 2. Stable cardiomegaly with aortic atherosclerosis. 3. Left basilar atelectasis with probable trace left effusion. Electronically Signed   By: Ashley Royalty M.D.   On: 07/16/2016 21:01   SIGNIFICANT EVENTS  2/02  Admit with acute bronchitis, AECOPD, hypoxic respiratory failure.  RSV+  STUDIES:    ASSESSMENT / PLAN:  Acute on chronic hypoxic respiratory failure Acute bronchitis AECOPD  RSV Pharyngitis  Plan: Continue Brovana, Pulmicort + PRN xopenex  Nasal hygiene - astelin, flonase Claritin Robitussin DM PRN  Reduce decadron to 4mg  IV Q12, rapid wean to off BID PPI Tussionex PRN DNR Would return to [facility] at discharge Will need ambulatory O2 assessment  Follow up with Dr. Lamonte Sakai as outpatient   Af w/ RVR; mild troponin rise  Known CAD  Plan: Mgmt per Cardiology  Continue Eliquis Lipitor  ASA  Acute on ckd stage III  Plan: Follow BMP / UOP  Mgmt per primary    Noe Gens, NP-C [facility] Pgr: [phone] or if no answer [phone] 07/18/2016,  2:26 PM

## 2016-07-18 NOTE — Progress Notes (Signed)
Patient received prn order for hydralazine 5 mg IV for SBP>170.  It helped when SBP>180-190's but he is still maintaining SBP 150-165 since cardizem drip has been stopped.

## 2016-07-18 NOTE — Progress Notes (Addendum)
Progress Note  Patient Name: Eduardo [name] Date of Encounter: 07/18/2016  Primary Cardiologist: Pernell Dupre  Subjective   No chest pain. No palpitations. No dizziness. Some dyspnea.   Inpatient Medications    Scheduled Meds: . acetaminophen  1,000 mg Oral BID  . apixaban  2.5 mg Oral BID  . arformoterol  15 mcg Nebulization BID  . aspirin  81 mg Oral Daily  . atorvastatin  80 mg Oral q1800  . azelastine  2 spray Each Nare QHS  . budesonide (PULMICORT) nebulizer solution  0.5 mg Nebulization BID  . chlorpheniramine-HYDROcodone  5 mL Oral Q12H  . dexamethasone  6 mg Intravenous Q6H  . escitalopram  20 mg Oral QHS  . fenofibrate  160 mg Oral QHS  . fluticasone  2 spray Each Nare BID  . guaiFENesin  600 mg Oral BID  . insulin aspart  0-5 Units Subcutaneous QHS  . insulin aspart  0-9 Units Subcutaneous TID WC  . lamoTRIgine  150 mg Oral QHS  . levalbuterol  0.63 mg Nebulization TID  . levofloxacin (LEVAQUIN) IV  500 mg Intravenous Q48H  . loratadine  10 mg Oral Daily  . multivitamin with minerals  1 tablet Oral QHS  . pantoprazole  40 mg Oral BID  . polyethylene glycol  17 g Oral Daily  . senna-docusate  2 tablet Oral Daily  . spironolactone  25 mg Oral Once per day on Mon Wed Fri  . verapamil  240 mg Oral QHS   Continuous Infusions:  PRN Meds: acetaminophen **OR** acetaminophen, guaiFENesin-dextromethorphan, hydrALAZINE, levalbuterol, levalbuterol, ondansetron **OR** ondansetron (ZOFRAN) IV, pneumococcal 23 valent vaccine, sodium chloride flush, triamcinolone cream   Vital Signs    Vitals:   07/18/16 0500 07/18/16 0600 07/18/16 0722 07/18/16 0806  BP: (!) 163/77 (!) 162/70 (!) 161/86   Pulse: 72 73 80   Resp: 12 11 18    Temp:   97.6 F (36.4 C)   TempSrc:   Oral   SpO2: 96% 99% 100% 98%  Weight:      Height:        Intake/Output Summary (Last 24 hours) at 07/18/16 0921 Last data filed at 07/18/16 4132  Gross per 24 hour  Intake            688.5 ml    Output             1950 ml  Net          -1261.5 ml   Filed Weights   07/14/16 1443 07/16/16 0400  Weight: 155 lb 13.8 oz (70.7 kg) 156 lb 15.5 oz (71.2 kg)    Telemetry    Atrial fib - rates 70s but occasionally dipping into 40s even during day - Personally Reviewed   Physical Exam   GEN: No acute distress, audible congested exhalation HEENT: Normocephalic, atraumatic, sclera non-icteric. Neck: No JVD or bruits. Cardiac: Irregularly irregular, rate controlled, no murmurs, rubs, or gallops.  Radials/DP/PT 1+ and equal bilaterally.  Respiratory: Diffuse rhonchi and wheezing. Breathing is unlabored. GI: Soft, nontender, non-distended, BS +x 4. MS: no deformity. Extremities: No clubbing or cyanosis. No edema. Distal pedal pulses are 2+ and equal bilaterally. Neuro:  AAOx3. Follows commands. Psych:  Responds to questions appropriately with a normal affect.  Labs    Chemistry  Recent Labs Lab 07/16/16 0222 07/17/16 1605 07/17/16 2024 07/18/16 0430  NA 133* 132*  --  134*  K 4.8 5.5* 5.4* 5.0  CL 102 98*  --  99*  CO2 22 24  --  27  GLUCOSE 156* 148*  --  141*  BUN 67* 78*  --  66*  CREATININE 1.51* 1.57*  --  1.23  CALCIUM 9.9 9.8  --  9.7  PROT  --   --   --  6.5  ALBUMIN  --   --   --  3.6  AST  --   --   --  54*  ALT  --   --   --  47  ALKPHOS  --   --   --  55  BILITOT  --   --   --  0.8  GFRNONAA 41* 39*  --  52*  GFRAA 47* 45*  --  >60  ANIONGAP 9 10  --  8     Hematology  Recent Labs Lab 07/16/16 0222 07/17/16 0500 07/18/16 0430  WBC 14.0* 10.9* 11.3*  RBC 3.32* 3.08* 3.17*  HGB 9.7* 8.9* 9.1*  HCT 30.0* 27.9* 28.4*  MCV 90.4 90.6 89.6  MCH 29.2 28.9 28.7  MCHC 32.3 31.9 32.0  RDW 14.5 14.4 14.3  PLT 201 192 203    Cardiac Enzymes  Recent Labs Lab 07/14/16 0210 07/14/16 0715 07/14/16 1322  TROPONINI 0.48* 2.69* 3.90*     Recent Labs Lab 07/13/16 2311 07/14/16 0241  TROPIPOC 0.00 0.57*     BNP  Recent Labs Lab  07/13/16 2303  BNP 261.9*     DDimer No results for input(s): DDIMER in the last 168 hours.   Radiology    Dg Chest Port 1 View  Result Date: 07/16/2016 CLINICAL DATA:  PICC line placement EXAM: PORTABLE CHEST 1 VIEW COMPARISON:  07/16/2016 CXR FINDINGS: Stable cardiomegaly with aortic atherosclerosis. New right-sided PICC line tip in the distal SVC. Atelectasis at the left lung base. No pneumonic consolidation or overt pulmonary edema. Probable tiny left pleural effusion. No suspicious osseous abnormalities. Old right mid clavicular fracture. Calcific tendinopathy of the left rotator cuff. IMPRESSION: 1. PICC line tip in the distal SVC. 2. Stable cardiomegaly with aortic atherosclerosis. 3. Left basilar atelectasis with probable trace left effusion. Electronically Signed   By: Ashley Royalty M.D.   On: 07/16/2016 21:01    Cardiac Studies   2D Echo 07/15/16 - Left ventricle: The cavity size was normal. Wall thickness was   increased in a pattern of mild LVH. Systolic function was mildly   reduced. The estimated ejection fraction was in the range of 45%   to 50%. Mild diffuse hypokinesis with no identifiable regional   variations. Doppler parameters are consistent with elevated mean   left atrial filling pressure. - Aortic valve: Valve mobility was mildly restricted. There was   mild stenosis. Valve area (VTI): 1.5 cm^2. Valve area (Vmax): 1.6   cm^2. Valve area (Vmean): 1.49 cm^2. - Mitral valve: Severely calcified annulus. Mildly thickened   leaflets . The findings are consistent with mild stenosis. There   was moderate to severe regurgitation. Valve area by continuity   equation (using LVOT flow): 1.61 cm^2. - Left atrium: The atrium was severely dilated. - Right ventricle: The cavity size was mildly dilated. Systolic   function was mildly reduced. - Right atrium: The atrium was severely dilated. - Tricuspid valve: There was mild-moderate regurgitation directed   centrally. -  Pulmonary arteries: Systolic pressure was mildly increased. PA   peak pressure: 50 mm Hg (S).  Patient Profile     83 y.o. male with CAD (cath 2012 c/b dissection  of RCA with ST elevation/temp pacer, medically managed), chronic atrial fib on Eliquis, chronic respiratory failure/COPD, HLD and CVA admitted with dyspnea, cough and wheezing, found to have AECOPD and + RSV. Troponin was elevated to 3.9.  Assessment & Plan    1. AECOPD/RSV: Still with diffuse coarse lung sounds. Per primary team  2. CAD/elevated troponin - Pt with known moderate CAD by cath in January 2012. This was complicated by dissection of the ostial RCA. He has done well with medical management since then. Now with elevated troponin in the setting of COPD exacerbation and rapid atrial fib. Echo 07/15/16 with mild global LV systolic dysfunction. No indication for cardiac cath right now. His elevated troponin is likely due to demand ischemia as he has diffuse moderate CAD with heavily calcified coronaries by cath 6 years ago. Recommend continued medical management. Would only plan cath if he develops angina.   3. New LV dysfunction/moderate-severe MR - MR could be contributing to LV dysfunction. He is not a good candidate for open surgical procedure. Will follow. Hold off ACEI for now given AKI on CKD.   4. AKI on probable mild CKD: Stable  5. Persistent atrial fib: rate now controlled on PO verapamil. Verapamil is not ideal choice given his LV dysfunction but I do not suspect he will tolerate BB given his severe COPD. Pharmacy has adjusted dosing of his Eliquis. Will need to follow renal function to determine if his home dose needs to be changed permanently.  6. Anemia - per IM. Will need to be followed given his anticoagulation.  7. Thyroid disease: TSH is low. Further studies per primary team  Will sign off for now. Please call with questions.   Lauree Chandler 07/18/2016 9:21 AM

## 2016-07-18 NOTE — Progress Notes (Signed)
Weaned FIO2 to RA due to stable sats

## 2016-07-18 NOTE — Progress Notes (Signed)
   Name: Eduardo [name] MRN: [id number] DOB: 1931-03-27    ADMISSION DATE:  07/13/2016 CONSULTATION DATE:  2/3  REFERRING MD :  Doyle Askew   CHIEF COMPLAINT:  Acute on chronic respiratory failure   BRIEF PATIENT DESCRIPTION:  83 year old male w/ known h/o Chronic respiratory failure in setting of COPD (f/b [place]rum). Admitted 2/3 w/ acute bronchitis, AECOPD and resultant acute on chronic respiratory failure   SIGNIFICANT EVENTS    STUDIES:    SUBJECTIVE:  Less SOB Improved voice  VITAL SIGNS: Temp:  [97.6 F (36.4 C)-98.9 F (37.2 C)] 98.3 F (36.8 C) (02/07 1203) Pulse Rate:  [39-96] 80 (02/07 0722) Resp:  [10-19] 18 (02/07 0722) BP: (123-193)/(68-116) 160/81 (02/07 1305) SpO2:  [94 %-100 %] 98 % (02/07 0806)  PHYSICAL EXAMINATION: General: elderly man in bed, no distress, much improved upper airway sounds Neuro:  Awake and appropriate, non-focal HEENT:  Exp stridor resolved, now with only exp sounds Cardiovascular:  Irreg irreg, controlled rate Lungs:  Bilateral exp wheezes minor , resolving Abdomen: Soft benign, no r/g Musculoskeletal:  No deformities Skin:  Large UE bruise, no rash   Recent Labs Lab 07/16/16 0222 07/17/16 1605 07/17/16 2024 07/18/16 0430  NA 133* 132*  --  134*  K 4.8 5.5* 5.4* 5.0  CL 102 98*  --  99*  CO2 22 24  --  27  BUN 67* 78*  --  66*  CREATININE 1.51* 1.57*  --  1.23  GLUCOSE 156* 148*  --  141*    Recent Labs Lab 07/14/16 0244 07/14/16 0715 07/14/16 1322 07/15/16 0636 07/16/16 0222 07/17/16 0500 07/18/16 0430  WBC  --   --   --  12.2* 14.0* 10.9* 11.3*  LATICACIDVEN 5.29* 3.0* 1.5  --   --   --   --       Recent Labs Lab 07/16/16 0222 07/17/16 0500 07/18/16 0430  HGB 9.7* 8.9* 9.1*  HCT 30.0* 27.9* 28.4*  WBC 14.0* 10.9* 11.3*  PLT 201 192 203   Dg Chest Port 1 View  Result Date: 07/16/2016 CLINICAL DATA:  PICC line placement EXAM: PORTABLE CHEST 1 VIEW COMPARISON:  07/16/2016 CXR FINDINGS: Stable  cardiomegaly with aortic atherosclerosis. New right-sided PICC line tip in the distal SVC. Atelectasis at the left lung base. No pneumonic consolidation or overt pulmonary edema. Probable tiny left pleural effusion. No suspicious osseous abnormalities. Old right mid clavicular fracture. Calcific tendinopathy of the left rotator cuff. IMPRESSION: 1. PICC line tip in the distal SVC. 2. Stable cardiomegaly with aortic atherosclerosis. 3. Left basilar atelectasis with probable trace left effusion. Electronically Signed   By: Ashley Royalty M.D.   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 Titus Mould, MD, [place] Pgr: [facility]

## 2016-07-18 NOTE — Discharge Instructions (Addendum)
Information on my medicine - ELIQUIS (apixaban)  Why was Eliquis prescribed for you? Eliquis was prescribed for you to reduce the risk of a blood clot forming that can cause a stroke if you have a medical condition called atrial fibrillation (a type of irregular heartbeat).  What do You need to know about Eliquis ? Take your Eliquis TWICE DAILY - one tablet in the morning and one tablet in the evening with or without food. If you have difficulty swallowing the tablet whole please discuss with your pharmacist how to take the medication safely.  Take Eliquis exactly as prescribed by your doctor and DO NOT stop taking Eliquis without talking to the doctor who prescribed the medication.  Stopping may increase your risk of developing a stroke.  Refill your prescription before you run out.  After discharge, you should have regular check-up appointments with your healthcare provider that is prescribing your Eliquis.  In the future your dose may need to be changed if your kidney function or weight changes by a significant amount or as you get older.  What do you do if you miss a dose? If you miss a dose, take it as soon as you remember on the same day and resume taking twice daily.  Do not take more than one dose of ELIQUIS at the same time to make up a missed dose.  Important Safety Information A possible side effect of Eliquis is bleeding. You should call your healthcare provider right away if you experience any of the following: ? Bleeding from an injury or your nose that does not stop. ? Unusual colored urine (red or dark brown) or unusual colored stools (red or black). ? Unusual bruising for unknown reasons. ? A serious fall or if you hit your head (even if there is no bleeding).  Some medicines may interact with Eliquis and might increase your risk of bleeding or clotting while on Eliquis. To help avoid this, consult your healthcare provider or pharmacist prior to using any new  prescription or non-prescription medications, including herbals, vitamins, non-steroidal anti-inflammatory drugs (NSAIDs) and supplements.  This website has more information on Eliquis (apixaban): http://www.eliquis.com/eliquis/home   Respiratory Syncytial Virus, Adult Respiratory syncytial virus (RSV) is a common viral infection. It is caused by a virus that is similar to viruses that cause cold and flu symptoms. RSV can affect the nose, throat, and upper air passages (upper respiratory system) and the windpipe and lungs (lower respiratory system). If RSV affects the air passages in your lungs, you have bronchiolitis. If RSV affects your lungs, you have pneumonia. RSV spreads from person to person (is contagious) through droplets from coughs and sneezes (respiratory secretions). RSV is rarely serious when it occurs in adults. What are the causes? An RSV infection is caused by the respiratory syncytial virus. When a sick person coughs or sneezes, there are particles of the virus in the droplets. You can get sick if you breathe in (inhale) those droplets. The virus can also survive for a while in droplets that land on surfaces. If you touch a contaminated surface and then touch your face, the virus can enter your body through your mouth, nose, or eyes. The virus also spreads through kissing, close contact, and shared eating or drinking utensils. What increases the risk? You may have a higher risk for RSV infection if:  You are 51 or older.  You have a long-term (chronic) lung condition, such as COPD.  You have a weakened disease-fighting system (immune system).  You have Down syndrome.  You have heart disease.  You work in a hospital or other health care facility.  You live in a long-term health care facility. What are the signs or symptoms? Symptoms of RSV include:  Runny nose.  Coughing. You may have a cough that brings up mucus (productive cough).  Sneezing.  Fever.  Decreased  appetite.  Breathing loudly (wheezing).  Shortness of breath.  Fluid buildup in the lungs (respiratory distress). How is this diagnosed? This condition may be diagnosed based on:  Your symptoms.  Your medical history.  A physical exam.  A chest X-ray to rule out pneumonia.  Blood tests or tests of mucus from your lungs (sputum). These tests may be done if you are older.  A test of your respiratory secretions. How is this treated? In most cases, the RSV infection will go away after 1-2 weeks of caring for yourself at home. If you have severe RSV and you develop pneumonia, you may need to be treated in the hospital with oxygen, antibiotic medicine, and medicines to open your breathing tubes (bronchodilators). Follow these instructions at home:  Take over-the-counter and prescription medicines only as told by your health care provider.  Drink enough fluid to keep your urine clear or pale yellow.  Rest at home until your symptoms go away.  Eat a healthy diet.  Do not use any products that contain nicotine or tobacco, such as cigarettes and e-cigarettes. If you need help quitting, ask your health care provider.  Keep all follow-up visits as told by your health care provider. This is important. How is this prevented? To prevent catching and spreading the RSV virus:  Wash your hands often with soap and water. If soap and water are not available, use an alcohol-based hand sanitizer. If you have not cleaned your hands, do not touch your face.  If you have cold-like or flu-like symptoms, stay home.  Cover your nose and mouth when you cough or sneeze.  Avoid large groups of people.  Keep a safe distance from people who are coughing and sneezing. Contact a health care provider if:  Your symptoms get worse.  Your symptoms have not improved after 2 weeks.  You have a fever.  You have continuing (persistent) sweatiness, hot flashes, or chills.  You cough up much more mucus  than usual.  You cough up blood.  You feel very tired (are lethargic).  You become confused.  You have respiratory distress that gets worse. This information is not intended to replace advice given to you by your health care provider. Make sure you discuss any questions you have with your health care provider. Document Released: 11/08/2015 Document Revised: 12/16/2015 Document Reviewed: 11/08/2015 Elsevier Interactive Patient Education  2017 [employer].   Additional discharge instructions:  Please get your medications reviewed and adjusted by your Primary MD.  Please request your Primary MD to go over all Hospital Tests and Procedure/Radiological results at the follow up, please get all Hospital records sent to your Prim MD by signing hospital release before you go home.  If you had Pneumonia of Lung problems at the Hospital: Please get a 2 view Chest X ray done in 6-8 weeks after hospital discharge or sooner if instructed by your Primary MD.  If you have Congestive Heart Failure: Please call your Cardiologist or Primary MD anytime you have any of the following symptoms:  1) 3 pound weight gain in 24 hours or 5 pounds in 1 week  2)  shortness of breath, with or without a dry hacking cough  3) swelling in the hands, feet or stomach  4) if you have to sleep on extra pillows at night in order to breathe  Follow cardiac low salt diet and 1.5 lit/day fluid restriction.  If you have diabetes Accuchecks 4 times/day, Once in AM empty stomach and then before each meal. Log in all results and show them to your primary doctor at your next visit. If any glucose reading is under 80 or above 300 call your primary MD immediately.  If you have Seizure/Convulsions/Epilepsy: Please do not drive, operate heavy machinery, participate in activities at heights or participate in high speed sports until you have seen by Primary MD or a Neurologist and advised to do so again.  If you had  Gastrointestinal Bleeding: Please ask your Primary MD to check a complete blood count within one week of discharge or at your next visit. Your endoscopic/colonoscopic biopsies that are pending at the time of discharge, will also need to followed by your Primary MD.  Get Medicines reviewed and adjusted. Please take all your medications with you for your next visit with your Primary MD  Please request your Primary MD to go over all hospital tests and procedure/radiological results at the follow up, please ask your Primary MD to get all Hospital records sent to his/her office.  If you experience worsening of your admission symptoms, develop shortness of breath, life threatening emergency, suicidal or homicidal thoughts you must seek medical attention immediately by calling 911 or calling your MD immediately  if symptoms less severe.  You must read complete instructions/literature along with all the possible adverse reactions/side effects for all the Medicines you take and that have been prescribed to you. Take any new Medicines after you have completely understood and accpet all the possible adverse reactions/side effects.   Do not drive or operate heavy machinery when taking Pain medications.   Do not take more than prescribed Pain, Sleep and Anxiety Medications  Special Instructions: If you have smoked or chewed Tobacco  in the last 2 yrs please stop smoking, stop any regular Alcohol  and or any Recreational drug use.  Wear Seat belts while driving.  Please note You were cared for by a hospitalist during your hospital stay. If you have any questions about your discharge medications or the care you received while you were in the hospital after you are discharged, you can call the unit and asked to speak with the hospitalist on call if the hospitalist that took care of you is not available. Once you are discharged, your primary care physician will handle any further medical issues. Please note that  NO REFILLS for any discharge medications will be authorized once you are discharged, as it is imperative that you return to your primary care physician (or establish a relationship with a primary care physician if you do not have one) for your aftercare needs so that they can reassess your need for medications and monitor your lab values.  You can reach the hospitalist office at phone [phone] or fax [phone]   If you do not have a primary care physician, you can call [phone] for a physician referral.

## 2016-07-18 NOTE — Progress Notes (Signed)
PROGRESS NOTE    Eduardo [name]  UXL:[id number] DOB: 1930-10-23 DOA: 07/13/2016  PCP: [name], MD   Brief Narrative:  83 year old male w/ known h/o Chronic respiratory failure in setting of COPD (f/b [place]). Admitted 2/3 w/ acute bronchitis, AECOPD and resultant acute on chronic respiratory failure   Assessment & Plan:   Principal Problem:   Acute on chronic respiratory failure with hypoxia (HCC), RSV positive  - pt with known advanced COPD and oxygen dependent at baseline, failed recent treatment with doxycycline and prednisone in an outpatient setting and now admitted for worsening acute COPD exacerbation with acute bronchitis  - continue BD's scheduled and as needed - increased frequency of steroids and changed to decadron per PCCM and no plan to taper down yet until resp status improves  - today is day #5/5 of Levaquin, vanc was discontinued 2/4 - PCCM follow-up appreciated: Acute on chronic hypoxic respiratory failure secondary to viral pneumonitis, acute bronchitis, COPD exacerbation, RSV pharyngitis and upper airway VCD from RSV. Has responded well to Decadron, should reduce over to prednisone in next 48 hours and then taper over one week 2 of. Most of chest wheezing is referred from neck which is improved.  Active Problems:   Atrial fibrillation with rapid ventricular response (HCC) - CHADS2VASC2 6 - with mild troponin elevation and likely secondary to sepsis rather than ACS pattern  - pt also with known CAD - Cardizem drip discontinued. Rate now controlled on verapamil.  - per cardiology, verapamil is not an ideal choice but no BB given severe COPD - continue Eliquis -dose adjusted per pharmacy. - cardiology follow-up appreciated-signed off to/7.    New LV dysfunction/moderate-severe MR - MR could be contributing to LV dysfunction - not a good candidate for open surgical procedure - hold ACEI for now given AKI on CKD - no over pulmonary edema on exam    Acute kidney injury, hyponatremia  - unclear etiology, noted on blood work 2/5 - Seems to have resolved.    CAD with elevated troponin - As per cardiology, known moderate CAD by cath in January 2012. Elevated troponin currently in the setting of COPD exacerbation and rapid A. fib. Echo 07/15/16 with mild global LV systolic dysfunction. No indication for cardiac cath right now. His troponin elevation is likely due to demand ischemia as he has diffuse CAD with heavily calcified coronaries by cath 6 years ago. Cardiology recommended continued medical management and would only plan cath if he develops angina.    Septic shock secondary to RSV bronchitis - treat COPD, supportive care  - empiric ABX Levaquin for now, day #5/5 - lactic acid cleared     Hyperkalemia - Resolved after a dose of Kayexalate on 2/6. Follow BMP in a.m. On Aldactone 3 times a week, may have to discontinue if has recurrence.  Low TSH  - outpatient follow-up. Clinically euthyroid.  Anemia  - Stable.   Constipation - Bowel regimen.   Glucose intolerance - Continue SSI. Reasonable inpatient glycemic control. Check A1c.  Essential hypertension - Uncontrolled. Continue verapamil. Added when necessary hydralazine. Monitor.   DVT prophylaxis: On Eliquis Code Status: No CPR and no intubation but OK with medications per ACLS  Family Communication:  discussed in detail with patient's spouse at bedside. Updated care and answered questions.  Disposition Plan: to be determined but likely home   Consultants:   PCCM  Cardiology   Procedures:   PICC line.  Antimicrobials:   Vancomycin 2/3 --> 2/4  Levaquin 2/3 -->   Subjective: Seen this morning. Feels better. Dry cough. Decreased dyspnea. Constipation and no BM for 4-5 days. Passing flatus. No abdominal pain, nausea, vomiting.  Objective: Vitals:   07/18/16 1305 07/18/16 1505 07/18/16 1548 07/18/16 1600  BP: (!) 160/81   (!) 161/71  Pulse:    83  Resp:        Temp:    97.8 F (36.6 C)  TempSrc:    Axillary  SpO2:  98% 93% 93%  Weight:      Height:        Intake/Output Summary (Last 24 hours) at 07/18/16 1655 Last data filed at 07/18/16 1404  Gross per 24 hour  Intake            599.5 ml  Output             2825 ml  Net          -2225.5 ml   Filed Weights   07/14/16 1443 07/16/16 0400  Weight: 70.7 kg (155 lb 13.8 oz) 71.2 kg (156 lb 15.5 oz)    Examination:  General exam: Pleasant elderly male sitting up comfortably in chair this morning. Respiratory system: Slightly harsh breath sounds bilaterally with occasional expiratory rhonchi posteriorly and occasional basal crackles. No increased work of breathing.  Cardiovascular system:S1 and S2 heard, irregularly irregular. No JVD, murmurs or pedal edema. Telemetry: A. fib with controlled ventricular rate/BBB morphology. Gastrointestinal system: Abdomen is nondistended, soft and nontender. No organomegaly  Central nervous system: Alert and oriented. No focal neurological deficits. Psychiatry: Judgement and insight appear normal. Mood & affect appropriate.    Data Reviewed: I have personally reviewed following labs and imaging studies  CBC:  Recent Labs Lab 07/14/16 0210 07/15/16 0636 07/16/16 0222 07/17/16 0500 07/18/16 0430  WBC 13.8* 12.2* 14.0* 10.9* 11.3*  HGB 10.3* 10.1* 9.7* 8.9* 9.1*  HCT 32.1* 31.4* 30.0* 27.9* 28.4*  MCV 90.2 90.2 90.4 90.6 89.6  PLT 188 181 201 192 268   Basic Metabolic Panel:  Recent Labs Lab 07/14/16 0210 07/15/16 0636 07/16/16 0222 07/17/16 1605 07/17/16 2024 07/18/16 0430  NA 139 135 133* 132*  --  134*  K 3.8 5.3* 4.8 5.5* 5.4* 5.0  CL 103 102 102 98*  --  99*  CO2 21* 21* 22 24  --  27  GLUCOSE 232* 148* 156* 148*  --  141*  BUN 39* 49* 67* 78*  --  66*  CREATININE 1.21 1.15 1.51* 1.57*  --  1.23  CALCIUM 9.9 10.0 9.9 9.8  --  9.7  MG  --   --   --   --   --  2.5*  PHOS  --   --   --   --   --  3.7   Cardiac  Enzymes:  Recent Labs Lab 07/14/16 0210 07/14/16 0715 07/14/16 1322  TROPONINI 0.48* 2.69* 3.90*   CBG:  Recent Labs Lab 07/17/16 1214 07/17/16 1659 07/17/16 2032 07/18/16 0755 07/18/16 1236  GLUCAP 207* 155* 135* 156* 164*   Recent Results (from the past 240 hour(s))  Respiratory Panel by PCR     Status: Abnormal   Collection Time: 07/14/16 12:09 AM  Result Value Ref Range Status   Adenovirus NOT DETECTED NOT DETECTED Final   Coronavirus 229E NOT DETECTED NOT DETECTED Final   Coronavirus HKU1 NOT DETECTED NOT DETECTED Final   Coronavirus NL63 NOT DETECTED NOT DETECTED Final   Coronavirus OC43 NOT DETECTED NOT DETECTED Final  Metapneumovirus NOT DETECTED NOT DETECTED Final   Rhinovirus / Enterovirus NOT DETECTED NOT DETECTED Final   Influenza A NOT DETECTED NOT DETECTED Final   Influenza B NOT DETECTED NOT DETECTED Final   Parainfluenza Virus 1 NOT DETECTED NOT DETECTED Final   Parainfluenza Virus 2 NOT DETECTED NOT DETECTED Final   Parainfluenza Virus 3 NOT DETECTED NOT DETECTED Final   Parainfluenza Virus 4 NOT DETECTED NOT DETECTED Final   Respiratory Syncytial Virus DETECTED (A) NOT DETECTED Final    Comment: CRITICAL RESULT CALLED TO, READ BACK BY AND VERIFIED WITH: Adora Fridge AT 8828 07/15/16 BY L BENFIELD    Bordetella pertussis NOT DETECTED NOT DETECTED Final   Chlamydophila pneumoniae NOT DETECTED NOT DETECTED Final   Mycoplasma pneumoniae NOT DETECTED NOT DETECTED Final  Culture, blood (routine x 2) Call MD if unable to obtain prior to antibiotics being given     Status: None (Preliminary result)   Collection Time: 07/14/16  2:10 AM  Result Value Ref Range Status   Specimen Description BLOOD RIGHT ARM  Final   Special Requests BOTTLES DRAWN AEROBIC AND ANAEROBIC 5ML  Final   Culture NO GROWTH 4 DAYS  Final   Report Status PENDING  Incomplete  Culture, blood (routine x 2) Call MD if unable to obtain prior to antibiotics being given     Status: None  (Preliminary result)   Collection Time: 07/14/16  2:15 AM  Result Value Ref Range Status   Specimen Description BLOOD RIGHT HAND  Final   Special Requests BOTTLES DRAWN AEROBIC AND ANAEROBIC 5ML  Final   Culture NO GROWTH 4 DAYS  Final   Report Status PENDING  Incomplete  MRSA PCR Screening     Status: None   Collection Time: 07/14/16  2:37 PM  Result Value Ref Range Status   MRSA by PCR NEGATIVE NEGATIVE Final    Comment:        The GeneXpert MRSA Assay (FDA approved for NASAL specimens only), is one component of a comprehensive MRSA colonization surveillance program. It is not intended to diagnose MRSA infection nor to guide or monitor treatment for MRSA infections.   Culture, expectorated sputum-assessment     Status: None   Collection Time: 07/15/16  9:18 PM  Result Value Ref Range Status   Specimen Description SPUTUM  Final   Special Requests NONE  Final   Sputum evaluation THIS SPECIMEN IS ACCEPTABLE FOR SPUTUM CULTURE  Final   Report Status 07/16/2016 FINAL  Final  Culture, respiratory (NON-Expectorated)     Status: None   Collection Time: 07/15/16  9:18 PM  Result Value Ref Range Status   Specimen Description SPUTUM  Final   Special Requests NONE Reflexed from [id number]  Final   Gram Stain   Final    ABUNDANT WBC PRESENT, PREDOMINANTLY PMN MODERATE GRAM POSITIVE COCCI IN CLUSTERS FEW YEAST RARE GRAM POSITIVE RODS    Culture Consistent with normal respiratory flora.  Final   Report Status 07/18/2016 FINAL  Final    Radiology Studies: Dg Chest Port 1 View  Result Date: 07/16/2016 CLINICAL DATA:  PICC line placement EXAM: PORTABLE CHEST 1 VIEW COMPARISON:  07/16/2016 CXR FINDINGS: Stable cardiomegaly with aortic atherosclerosis. New right-sided PICC line tip in the distal SVC. Atelectasis at the left lung base. No pneumonic consolidation or overt pulmonary edema. Probable tiny left pleural effusion. No suspicious osseous abnormalities. Old right mid clavicular fracture.  Calcific tendinopathy of the left rotator cuff. IMPRESSION: 1. PICC line tip in the  distal SVC. 2. Stable cardiomegaly with aortic atherosclerosis. 3. Left basilar atelectasis with probable trace left effusion. Electronically Signed   By: Ashley Royalty M.D.   On: 07/16/2016 21:01      Scheduled Meds: . acetaminophen  1,000 mg Oral BID  . apixaban  5 mg Oral BID  . arformoterol  15 mcg Nebulization BID  . aspirin  81 mg Oral Daily  . atorvastatin  80 mg Oral q1800  . azelastine  2 spray Each Nare QHS  . budesonide (PULMICORT) nebulizer solution  0.5 mg Nebulization BID  . chlorpheniramine-HYDROcodone  5 mL Oral Q12H  . dexamethasone  4 mg Intravenous Q12H  . escitalopram  20 mg Oral QHS  . fenofibrate  160 mg Oral QHS  . fluticasone  2 spray Each Nare BID  . guaiFENesin  600 mg Oral BID  . insulin aspart  0-5 Units Subcutaneous QHS  . insulin aspart  0-9 Units Subcutaneous TID WC  . lamoTRIgine  150 mg Oral QHS  . loratadine  10 mg Oral Daily  . multivitamin with minerals  1 tablet Oral QHS  . pantoprazole  40 mg Oral BID  . polyethylene glycol  17 g Oral Daily  . senna-docusate  2 tablet Oral Daily  . spironolactone  25 mg Oral Once per day on Mon Wed Fri  . verapamil  240 mg Oral QHS   Continuous Infusions:    LOS: 4 days   Time spent: 20 minutes   Keedan Sample, MD [facility] Pager [phone]  If 7PM-7AM, please contact night-coverage [email or web address] Password [email or web address] 07/18/2016, 4:55 PM

## 2016-07-19 LAB — CULTURE, BLOOD (ROUTINE X 2)
CULTURE: NO GROWTH
Culture: NO GROWTH

## 2016-07-19 LAB — GLUCOSE, CAPILLARY
Glucose-Capillary: 86 mg/dL (ref 65–99)
Glucose-Capillary: 96 mg/dL (ref 65–99)
Glucose-Capillary: 119 mg/dL — ABNORMAL HIGH (ref 65–99)
Glucose-Capillary: 112 mg/dL — ABNORMAL HIGH (ref 65–99)

## 2016-07-19 LAB — BASIC METABOLIC PANEL
ANION GAP: 8 (ref 5–15)
BUN: 48 mg/dL — ABNORMAL HIGH (ref 6–20)
CHLORIDE: 98 mmol/L — AB (ref 101–111)
CO2: 29 mmol/L (ref 22–32)
Calcium: 9.9 mg/dL (ref 8.9–10.3)
Creatinine, Ser: 0.93 mg/dL (ref 0.61–1.24)
GFR calc non Af Amer: >60 mL/min (ref >60)
Glucose, Bld: 103 mg/dL — ABNORMAL HIGH (ref 65–99)
POTASSIUM: 5 mmol/L (ref 3.5–5.1)
Sodium: 135 mmol/L (ref 135–145)

## 2016-07-19 MED ORDER — HYDRALAZINE HCL 10 MG PO TABS
10.0000 mg | ORAL_TABLET | Freq: Three times a day (TID) | ORAL | Status: DC
  Administered 2016-07-19 – 2016-07-20 (×5): 10 mg via ORAL
  Filled 2016-07-19 (×5): qty 1

## 2016-07-19 MED ORDER — ACETAMINOPHEN 325 MG PO TABS: 650.0000 mg | ORAL_TABLET | Freq: Four times a day (QID) | ORAL | Status: DC | PRN

## 2016-07-19 MED ORDER — DEXAMETHASONE SODIUM PHOSPHATE 4 MG/ML IJ SOLN
4.0000 mg | Freq: Two times a day (BID) | INTRAMUSCULAR | Status: DC
  Administered 2016-07-19 (×2): 4 mg via INTRAVENOUS
  Filled 2016-07-19 (×3): qty 1

## 2016-07-19 NOTE — Progress Notes (Signed)
Occupational Therapy Treatment Patient Details Name: Eduardo [name] MRN: [id number] DOB: 02/10/1931 Today's Date: 07/19/2016    History of present illness Patient is an 83 yo male admitted 07/13/16 with acute resp failure with hypoxia.  CHF/COPD exacerbation, Afib, elevated troponin (per chart demand ischemia), and hyperkalemia.    PMH:  CHF, COPD, HLD, anemia, anxiety, Afib, CAD, MI, CVA, neuropathy, CKD   OT comments  Pt and wife educated in energy conservation. Pt with decreased ability to monitor his symptoms and pace himself. Tolerated increased activity today including ambulation in hall, toileting and one activity standing at sink. Pt 02 down to 86% on RA requiring cues for standing rest break. Will continue to follow.  Follow Up Recommendations  Home health OT    Equipment Recommendations  None recommended by OT    Recommendations for Other Services      Precautions / Restrictions Precautions Precautions: Fall Precaution Comments: watch SPO2 Restrictions Weight Bearing Restrictions: No       Mobility Bed Mobility               General bed mobility comments: pt in chair  Transfers Overall transfer level: Needs assistance Equipment used: 4-wheeled walker Transfers: Sit to/from Stand Sit to Stand: Min assist         General transfer comment: increased time, min A for stability with transition    Balance Overall balance assessment: Needs assistance Sitting-balance support: Feet supported Sitting balance-Leahy Scale: Fair     Standing balance support: Bilateral upper extremity supported Standing balance-Leahy Scale: Poor Standing balance comment: pt reliant on bilateral UEs on RW                   ADL Overall ADL's : Needs assistance/impaired     Grooming: Wash/dry hands;Min guard;Standing Grooming Details (indicate cue type and reason): tripods on sink, tolerated only one activity     Lower Body Bathing: Minimal  assistance;Sitting/lateral leans Lower Body Bathing Details (indicate cue type and reason): recommended long handled bath sponge Upper Body Dressing : Set up;Sitting   Lower Body Dressing: Minimal assistance;Sit to/from stand Lower Body Dressing Details (indicate cue type and reason): pt's wife will assist as needed Toilet Transfer: Minimal assistance;Ambulation;RW;Comfort height toilet   Toileting- Clothing Manipulation and Hygiene: Minimal assistance;Sit to/from stand       Functional mobility during ADLs: Minimal assistance;Rolling walker;Cueing for safety General ADL Comments: reinforced energy conservation strategies with pt and wife      Vision                     Perception     Praxis      Cognition   Behavior During Therapy: WFL for tasks assessed/performed Overall Cognitive Status: Impaired/Different from baseline Area of Impairment: Safety/judgement          Safety/Judgement: Decreased awareness of safety;Decreased awareness of deficits     General Comments: pt needing cues for pacing and safety with rollator    Extremity/Trunk Assessment               Exercises     Shoulder Instructions       General Comments      Pertinent Vitals/ Pain       Pain Assessment: No/denies pain  Home Living  Prior Functioning/Environment              Frequency  Min 2X/week        Progress Toward Goals  OT Goals(current goals can now be found in the care plan section)  Progress towards OT goals: Progressing toward goals  Acute Rehab OT Goals Patient Stated Goal: return home, breathe better Time For Goal Achievement: 07/30/16 Potential to Achieve Goals: Good  Plan Discharge plan remains appropriate    Co-evaluation    PT/OT/SLP Co-Evaluation/Treatment: Yes Reason for Co-Treatment: For patient/therapist safety PT goals addressed during session: Mobility/safety with  mobility;Balance;Proper use of DME;Strengthening/ROM OT goals addressed during session: ADL's and self-care      End of Session Equipment Utilized During Treatment: Gait belt;Rolling walker   Activity Tolerance Patient tolerated treatment well   Patient Left in chair;with call bell/phone within reach;with family/visitor present   Nurse Communication          Time: 1030-1054 OT Time Calculation (min): 24 min  Charges: OT General Charges $OT Visit: 1 Procedure OT Treatments $Self Care/Home Management : 8-22 mins  Malka So 07/19/2016, 1:15 PM  [phone]

## 2016-07-19 NOTE — Plan of Care (Signed)
Problem: Education: Goal: Knowledge of [facility] Education information/materials will improve Outcome: Progressing Discussed plan of care with patient and his son tonight. Educated on what medications he is taking and how he is progressing with his hospitalization.   Problem: Safety: Goal: Ability to remain free from injury will improve Outcome: Progressing Patient demonstrates no impulsive behavior. Room is kept free of clutter and son at bedside to assist patient. Patient also knows to call for assistance out of bed.   Problem: Pain Managment: Goal: General experience of comfort will improve Outcome: Progressing Patient c/o no pain on my shift; however, aware that he has medications for this if necessary.

## 2016-07-19 NOTE — Care Management Note (Signed)
Case Management Note  Patient Details  Name: Eduardo [name] MRN: [id number] Date of Birth: 1931-04-15  Subjective/Objective:     Pt lives with wife who is primary caregiver.  PT/OT recommend home therapies and pt agrees.  Provided list of home health agencies and referral made to [place] per choice.                      Expected Discharge Plan:  [place]  Discharge planning Services  CM Consult  Post Acute Care Choice:  Home Health Choice offered to:  Spouse  HH Arranged:  PT, OT HH Agency:  [facility]  Status of Service:  In process, will continue to follow  Girard Cooter, RN 07/19/2016, 2:24 PM

## 2016-07-19 NOTE — Progress Notes (Signed)
Transferred to 9Y11 via wheelchair. Family with patient.

## 2016-07-19 NOTE — Progress Notes (Signed)
PROGRESS NOTE    Eduardo [name]  CXK:[id number] DOB: 1931-05-28 DOA: 07/13/2016  PCP: [name], MD   Brief Narrative:  83 year old male w/ known h/o Chronic respiratory failure in setting of COPD (f/b [place]). Admitted 2/3 w/ acute bronchitis, AECOPD and resultant acute on chronic respiratory failure   Assessment & Plan:   Principal Problem:   Acute on chronic respiratory failure with hypoxia (HCC), RSV positive  - pt with known advanced COPD and oxygen dependent at baseline, failed recent treatment with doxycycline and prednisone in an outpatient setting and now admitted for worsening acute COPD exacerbation with acute bronchitis  - continue BD's scheduled and as needed - increased frequency of steroids and changed to decadron per PCCM and no plan to taper down yet until resp status improves  - Completed course of Levaquin, vanc was discontinued 2/4 - PCCM follow-up appreciated: Acute on chronic hypoxic respiratory failure secondary to viral pneumonitis, acute bronchitis, COPD exacerbation, RSV pharyngitis and upper airway VCD from RSV. Has responded well to Decadron, should reduce over to prednisone in next 48 hours and then taper over one week 2 of. Most of chest wheezing is referred from neck which is improved.  Active Problems:   Atrial fibrillation with rapid ventricular response (HCC) - CHADS2VASC2 6 - with mild troponin elevation and likely secondary to sepsis rather than ACS pattern  - pt also with known CAD - Cardizem drip discontinued. Rate now controlled on verapamil.  - per cardiology, verapamil is not an ideal choice but no BB given severe COPD - continue Eliquis -dose adjusted per pharmacy. - cardiology follow-up appreciated-signed off 2/7.    New LV dysfunction/moderate-severe MR - MR could be contributing to LV dysfunction - not a good candidate for open surgical procedure - hold ACEI for now given AKI on CKD - no over pulmonary edema on exam     Acute  kidney injury, hyponatremia  - unclear etiology, noted on blood work 2/5 - Seems to have resolved.    CAD with elevated troponin - As per cardiology, known moderate CAD by cath in January 2012. Elevated troponin currently in the setting of COPD exacerbation and rapid A. fib. Echo 07/15/16 with mild global LV systolic dysfunction. No indication for cardiac cath right now. His troponin elevation is likely due to demand ischemia as he has diffuse CAD with heavily calcified coronaries by cath 6 years ago. Cardiology recommended continued medical management and would only plan cath if he develops angina.    Septic shock secondary to RSV bronchitis - treat COPD, supportive care  - Completed course of antibiotics. - lactic acid cleared     Hyperkalemia - Resolved after a dose of Kayexalate on 2/6. Potassium high normal at 5. Concern regarding aldosterone worsening his hyperkalemia and hence discontinued Aldactone on 2/8. Follow BMP in a.m. May consider Lasix.  Low TSH  - outpatient follow-up. Clinically euthyroid.  Anemia  - Stable.   Constipation - Bowel regimen.  Patient had 2 BMs on night of 2/7.  Glucose intolerance - Continue SSI. Reasonable inpatient glycemic control. Check A1c-pending..  Essential hypertension - Uncontrolled. Continue verapamil. Added when necessary hydralazine. Monitor. Still mildly uncontrolled. Add hydralazine 10 MG 3 times a day.   DVT prophylaxis: On Eliquis Code Status: No CPR and no intubation but OK with medications per ACLS  Family Communication:  discussed in detail with patient's son at bedside. Updated care and answered questions.  Disposition Plan: Transfer to medical bed 2/8 and  possible discharge home 2/9.   Consultants:   PCCM  Cardiology   Procedures:   PICC line.  Antimicrobials:   Vancomycin 2/3 --> 2/4  Levaquin 2/3 -->   Subjective: Overall feels better. Had 2 BMs overnight 2/7. Dyspnea continues to improve and breathing  approaching baseline. Now more DOE. As per RN, desaturates with activity to the 34s. Denies any other complaints.  Objective: Vitals:   07/19/16 0752 07/19/16 0800 07/19/16 0845 07/19/16 0847  BP: (!) 166/87 (!) 154/84    Pulse: 82 80    Resp: (!) 21 (!) 22    Temp: 98.1 F (36.7 C)     TempSrc: Axillary     SpO2: 100% 100% 100% 100%  Weight:      Height:        Intake/Output Summary (Last 24 hours) at 07/19/16 1149 Last data filed at 07/19/16 0755  Gross per 24 hour  Intake                0 ml  Output             1075 ml  Net            -1075 ml   Filed Weights   07/14/16 1443 07/16/16 0400 07/19/16 0402  Weight: 70.7 kg (155 lb 13.8 oz) 71.2 kg (156 lb 15.5 oz) 72.4 kg (159 lb 9.8 oz)    Examination:  General exam: Pleasant elderly male sitting up comfortably in bed this morning. Respiratory system: Improved breath sounds bilaterally. Occasional upper airway rhonchi noted in the upper chest. No increased work of breathing.  Cardiovascular system:S1 and S2 heard, irregularly irregular. No JVD, murmurs or pedal edema. Telemetry: A. fib with controlled ventricular rate/BBB morphology. Gastrointestinal system: Abdomen is nondistended, soft and nontender. No organomegaly  Central nervous system: Alert and oriented. No focal neurological deficits. Psychiatry: Judgement and insight appear normal. Mood & affect appropriate.    Data Reviewed: I have personally reviewed following labs and imaging studies  CBC:  Recent Labs Lab 07/14/16 0210 07/15/16 0636 07/16/16 0222 07/17/16 0500 07/18/16 0430  WBC 13.8* 12.2* 14.0* 10.9* 11.3*  HGB 10.3* 10.1* 9.7* 8.9* 9.1*  HCT 32.1* 31.4* 30.0* 27.9* 28.4*  MCV 90.2 90.2 90.4 90.6 89.6  PLT 188 181 201 192 195   Basic Metabolic Panel:  Recent Labs Lab 07/15/16 0636 07/16/16 0222 07/17/16 1605 07/17/16 2024 07/18/16 0430 07/19/16 0423  NA 135 133* 132*  --  134* 135  K 5.3* 4.8 5.5* 5.4* 5.0 5.0  CL 102 102 98*  --  99*  98*  CO2 21* 22 24  --  27 29  GLUCOSE 148* 156* 148*  --  141* 103*  BUN 49* 67* 78*  --  66* 48*  CREATININE 1.15 1.51* 1.57*  --  1.23 0.93  CALCIUM 10.0 9.9 9.8  --  9.7 9.9  MG  --   --   --   --  2.5*  --   PHOS  --   --   --   --  3.7  --    Cardiac Enzymes:  Recent Labs Lab 07/14/16 0210 07/14/16 0715 07/14/16 1322  TROPONINI 0.48* 2.69* 3.90*   CBG:  Recent Labs Lab 07/18/16 0755 07/18/16 1236 07/18/16 1701 07/18/16 2123 07/19/16 0751  GLUCAP 156* 164* 149* 132* 86   Recent Results (from the past 240 hour(s))  Respiratory Panel by PCR     Status: Abnormal   Collection Time: 07/14/16 12:09 AM  Result Value Ref Range Status   Adenovirus NOT DETECTED NOT DETECTED Final   Coronavirus 229E NOT DETECTED NOT DETECTED Final   Coronavirus HKU1 NOT DETECTED NOT DETECTED Final   Coronavirus NL63 NOT DETECTED NOT DETECTED Final   Coronavirus OC43 NOT DETECTED NOT DETECTED Final   Metapneumovirus NOT DETECTED NOT DETECTED Final   Rhinovirus / Enterovirus NOT DETECTED NOT DETECTED Final   Influenza A NOT DETECTED NOT DETECTED Final   Influenza B NOT DETECTED NOT DETECTED Final   Parainfluenza Virus 1 NOT DETECTED NOT DETECTED Final   Parainfluenza Virus 2 NOT DETECTED NOT DETECTED Final   Parainfluenza Virus 3 NOT DETECTED NOT DETECTED Final   Parainfluenza Virus 4 NOT DETECTED NOT DETECTED Final   Respiratory Syncytial Virus DETECTED (A) NOT DETECTED Final    Comment: CRITICAL RESULT CALLED TO, READ BACK BY AND VERIFIED WITH: Adora Fridge AT 6160 07/15/16 BY L BENFIELD    Bordetella pertussis NOT DETECTED NOT DETECTED Final   Chlamydophila pneumoniae NOT DETECTED NOT DETECTED Final   Mycoplasma pneumoniae NOT DETECTED NOT DETECTED Final  Culture, blood (routine x 2) Call MD if unable to obtain prior to antibiotics being given     Status: None (Preliminary result)   Collection Time: 07/14/16  2:10 AM  Result Value Ref Range Status   Specimen Description BLOOD RIGHT ARM   Final   Special Requests BOTTLES DRAWN AEROBIC AND ANAEROBIC 5ML  Final   Culture NO GROWTH 4 DAYS  Final   Report Status PENDING  Incomplete  Culture, blood (routine x 2) Call MD if unable to obtain prior to antibiotics being given     Status: None (Preliminary result)   Collection Time: 07/14/16  2:15 AM  Result Value Ref Range Status   Specimen Description BLOOD RIGHT HAND  Final   Special Requests BOTTLES DRAWN AEROBIC AND ANAEROBIC 5ML  Final   Culture NO GROWTH 4 DAYS  Final   Report Status PENDING  Incomplete  MRSA PCR Screening     Status: None   Collection Time: 07/14/16  2:37 PM  Result Value Ref Range Status   MRSA by PCR NEGATIVE NEGATIVE Final    Comment:        The GeneXpert MRSA Assay (FDA approved for NASAL specimens only), is one component of a comprehensive MRSA colonization surveillance program. It is not intended to diagnose MRSA infection nor to guide or monitor treatment for MRSA infections.   Culture, expectorated sputum-assessment     Status: None   Collection Time: 07/15/16  9:18 PM  Result Value Ref Range Status   Specimen Description SPUTUM  Final   Special Requests NONE  Final   Sputum evaluation THIS SPECIMEN IS ACCEPTABLE FOR SPUTUM CULTURE  Final   Report Status 07/16/2016 FINAL  Final  Culture, respiratory (NON-Expectorated)     Status: None   Collection Time: 07/15/16  9:18 PM  Result Value Ref Range Status   Specimen Description SPUTUM  Final   Special Requests NONE Reflexed from [id number]  Final   Gram Stain   Final    ABUNDANT WBC PRESENT, PREDOMINANTLY PMN MODERATE GRAM POSITIVE COCCI IN CLUSTERS FEW YEAST RARE GRAM POSITIVE RODS    Culture Consistent with normal respiratory flora.  Final   Report Status 07/18/2016 FINAL  Final    Radiology Studies: No results found.    Scheduled Meds: . acetaminophen  1,000 mg Oral BID  . apixaban  5 mg Oral BID  . arformoterol  15 mcg Nebulization  BID  . aspirin  81 mg Oral Daily  .  atorvastatin  80 mg Oral q1800  . azelastine  2 spray Each Nare QHS  . budesonide (PULMICORT) nebulizer solution  0.5 mg Nebulization BID  . chlorpheniramine-HYDROcodone  5 mL Oral Q12H  . dexamethasone  4 mg Intravenous Q12H  . escitalopram  20 mg Oral QHS  . fenofibrate  160 mg Oral QHS  . fluticasone  2 spray Each Nare BID  . guaiFENesin  600 mg Oral BID  . insulin aspart  0-5 Units Subcutaneous QHS  . insulin aspart  0-9 Units Subcutaneous TID WC  . lamoTRIgine  150 mg Oral QHS  . loratadine  10 mg Oral Daily  . milk and molasses  1 enema Rectal Once  . multivitamin with minerals  1 tablet Oral QHS  . pantoprazole  40 mg Oral BID  . polyethylene glycol  17 g Oral Daily  . senna-docusate  2 tablet Oral Daily  . verapamil  240 mg Oral QHS   Continuous Infusions:    LOS: 5 days   Time spent: 20 minutes   Dejane Scheibe, MD [facility] Pager [phone]  If 7PM-7AM, please contact night-coverage [email or web address] Password [facility] 07/19/2016, 11:49 AM

## 2016-07-19 NOTE — Care Management Important Message (Signed)
Important Message  Patient Details  Name: Eduardo [name] MRN: [id number] Date of Birth: 1930-08-13   Medicare Important Message Given:  Yes    Randell Teare Abena 07/19/2016, 11:05 AM

## 2016-07-19 NOTE — Progress Notes (Signed)
Physical Therapy Treatment Patient Details Name: AKIN YI MRN: [id number] DOB: 02-Apr-1931 Today's Date: 07/19/2016    History of Present Illness Patient is an 83 yo male admitted 07/13/16 with acute resp failure with hypoxia.  CHF/COPD exacerbation, Afib, elevated troponin (per chart demand ischemia), and hyperkalemia.    PMH:  CHF, COPD, HLD, anemia, anxiety, Afib, CAD, MI, CVA, neuropathy, CKD    PT Comments    Pt presented sitting OOB in recliner when therapists entered room. Pt ambulated on RA with one desat to 86% which quickly recovered to >90% with standing rest break. All other VSS throughout. Therapists discussed energy conservation techniques with pt and pt's spouse. Pt would continue to benefit from skilled physical therapy services at this time while admitted and after d/c to address his limitations in order to improve his overall safety and independence with functional mobility.    Follow Up Recommendations  Home health PT     Equipment Recommendations  None recommended by PT    Recommendations for Other Services       Precautions / Restrictions Precautions Precautions: Fall Precaution Comments: watch SPO2 Restrictions Weight Bearing Restrictions: No    Mobility  Bed Mobility               General bed mobility comments: pt sitting OOB in recliner when PT entered room  Transfers Overall transfer level: Needs assistance Equipment used: 1 person hand held assist Transfers: Sit to/from Stand Sit to Stand: Min assist         General transfer comment: increased time, min A for stability with transition  Ambulation/Gait Ambulation/Gait assistance: Min guard;+2 safety/equipment Ambulation Distance (Feet): 50 Feet Assistive device: 4-wheeled walker Gait Pattern/deviations: Step-through pattern;Decreased step length - right;Decreased step length - left;Decreased stride length;Trunk flexed Gait velocity: decreased Gait velocity interpretation: Below  normal speed for age/gender General Gait Details: pt required standing rest break x1 for approximately one minute secondary to decrease in SPO2 to 86%, quickly recovered to >90%    Stairs            Wheelchair Mobility    Modified Rankin (Stroke Patients Only)       Balance Overall balance assessment: Needs assistance Sitting-balance support: Feet supported Sitting balance-Leahy Scale: Fair     Standing balance support: Bilateral upper extremity supported Standing balance-Leahy Scale: Poor Standing balance comment: pt reliant on bilateral UEs on RW                    Cognition Arousal/Alertness: Awake/alert Behavior During Therapy: WFL for tasks assessed/performed Overall Cognitive Status: Within Functional Limits for tasks assessed                      Exercises      General Comments        Pertinent Vitals/Pain Pain Assessment: No/denies pain    Home Living                      Prior Function            PT Goals (current goals can now be found in the care plan section) Acute Rehab PT Goals Patient Stated Goal: return home, breathe better PT Goal Formulation: With patient/family Time For Goal Achievement: 07/22/16 Potential to Achieve Goals: Good Progress towards PT goals: Progressing toward goals    Frequency    Min 3X/week      PT Plan Discharge plan needs to be updated  Co-evaluation PT/OT/SLP Co-Evaluation/Treatment: Yes Reason for Co-Treatment: For patient/therapist safety PT goals addressed during session: Mobility/safety with mobility;Balance;Proper use of DME;Strengthening/ROM       End of Session Equipment Utilized During Treatment: Gait belt Activity Tolerance: Patient tolerated treatment well Patient left: in chair;with call bell/phone within reach;with family/visitor present     Time: 1030-1054 PT Time Calculation (min) (ACUTE ONLY): 24 min  Charges:  $Gait Training: 8-22 mins                     G CodesClearnce Sorrel Ramell Wacha 08-05-2016, 12:24 PM Sherie Don, [place], DPT [phone]

## 2016-07-20 LAB — BASIC METABOLIC PANEL
ANION GAP: 7 (ref 5–15)
BUN: 38 mg/dL — ABNORMAL HIGH (ref 6–20)
CALCIUM: 9.2 mg/dL (ref 8.9–10.3)
CHLORIDE: 99 mmol/L — AB (ref 101–111)
CO2: 31 mmol/L (ref 22–32)
Creatinine, Ser: 0.93 mg/dL (ref 0.61–1.24)
GFR calc non Af Amer: >60 mL/min (ref >60)
GLUCOSE: 105 mg/dL — AB (ref 65–99)
Potassium: 4.7 mmol/L (ref 3.5–5.1)
Sodium: 137 mmol/L (ref 135–145)

## 2016-07-20 LAB — HEMOGLOBIN A1C
Hgb A1c MFr Bld: 5.4 % (ref 4.8–5.6)
Mean Plasma Glucose: 108 mg/dL

## 2016-07-20 LAB — GLUCOSE, CAPILLARY
GLUCOSE-CAPILLARY: 92 mg/dL (ref 65–99)
Glucose-Capillary: 156 mg/dL — ABNORMAL HIGH (ref 65–99)

## 2016-07-20 MED ORDER — ATORVASTATIN CALCIUM 80 MG PO TABS: 80.0000 mg | ORAL_TABLET | Freq: Every day | ORAL | 0 refills | Status: DC

## 2016-07-20 MED ORDER — GUAIFENESIN-DM 100-10 MG/5ML PO SYRP: 5.0000 mL | ORAL_SOLUTION | ORAL | 0 refills | Status: DC | PRN

## 2016-07-20 MED ORDER — ALBUTEROL SULFATE HFA 108 (90 BASE) MCG/ACT IN AERS: 2.0000 | INHALATION_SPRAY | Freq: Four times a day (QID) | RESPIRATORY_TRACT | 0 refills | Status: DC | PRN

## 2016-07-20 MED ORDER — GUAIFENESIN ER 600 MG PO TB12: 600.0000 mg | ORAL_TABLET | Freq: Two times a day (BID) | ORAL | 0 refills | Status: DC

## 2016-07-20 MED ORDER — PREDNISONE 10 MG PO TABS: ORAL_TABLET | ORAL | 0 refills | Status: DC

## 2016-07-20 MED ORDER — HYDRALAZINE HCL 10 MG PO TABS: 10.0000 mg | ORAL_TABLET | Freq: Three times a day (TID) | ORAL | 0 refills | Status: DC

## 2016-07-20 MED ORDER — PREDNISONE 20 MG PO TABS
40.0000 mg | ORAL_TABLET | Freq: Every day | ORAL | Status: DC
  Administered 2016-07-20: 40 mg via ORAL
  Filled 2016-07-20: qty 2

## 2016-07-20 NOTE — Discharge Summary (Signed)
Physician Discharge Summary  Eduardo [name] QQP:[id number] DOB: 04-Sep-1930  PCP: [name], MD  Admit date: 07/13/2016 Discharge date: 07/20/2016  Recommendations for Outpatient Follow-up:  1. Dr. [name], PCP in 5 days with repeat labs (CBC & BMP). 2. Dr. Daneen Schick, Cardiology: MDs office will call patient to arrange follow-up appointment. 3. Tammy Parrett, NP/Pulmonology: Patient has appointment on 08/03/16 at 3 PM. 4. Recommend repeating TSH in 4-6 weeks. 5. Recommend repeating chest x-ray in 3-4 weeks.  Home Health: None Equipment/Devices: PT and OT    Discharge Condition: Improved and stable  CODE STATUS: Partial code in the hospital >no CPR and no intubation but okay with medications per ACLS. Diet recommendation: Heart healthy diet.  Discharge Diagnoses:  Principal Problem:   Acute respiratory failure with hypoxia (HCC) Active Problems:   Atrial fibrillation with rapid ventricular response (HCC)   Chronic diastolic HF (heart failure) (HCC)   COPD exacerbation (HCC)   Elevated troponin   Hyperglycemia   Dyslipidemia   Pruritic rash   Sepsis ([place])   Coronary artery disease involving native coronary artery of native heart without angina pectoris   LV dysfunction   Mitral regurgitation   Acute kidney injury superimposed on chronic kidney disease (HCC)   RSV (respiratory syncytial virus infection)   Physical deconditioning   Brief/Interim Summary: 83 year old male with history of HTN, HLD, chronic A. fib on Eliquis (Dr. Daneen Schick), COPD not on home oxygen (f/b [place]), CAD status post MI with PCI, CVA, presented on 07/14/16 with 3 days history of progressive worsening dyspnea and cough productive of white gray sputum. He had utilized home breathing treatments and inhalers without much relief. 2 days prior to admission, he had seen his pulmonologist and treated for COPD exacerbation with doxycycline, prednisone taper and Mucinex. He was admitted for acute  bronchitis, AECOPD and resultant acute on chronic respiratory failure. Pulmonology and cardiology consulted during the course of this admission. Clinically improved.   Assessment & Plan:   Principal Problem:   Acute on chronic respiratory failure with hypoxia ([place]), RSV positive  - pt with known advanced COPD, failed recent treatment with doxycycline and prednisone in an outpatient setting and now admitted for worsening acute COPD exacerbation with acute bronchitis. Verified today with patient and he indicates that he has not been on home oxygen prior to admission. - He was admitted to stepdown unit and treated with oxygen, bronchodilators, antibiotics, IV steroids. PCCM was consulted and and assisted with management - He completed course of antibiotics. - PCCM follow-up appreciated: Acute on chronic hypoxic respiratory failure secondary to viral pneumonitis, acute bronchitis, COPD exacerbation, RSV pharyngitis and upper airway VCD from RSV. Most of chest wheezing was referred from neck which improved. - Clinically improved. Hypoxia resolved. As per RN, patient ambulated in the hall on room air and maintained oxygen saturations between 96-99 percent without dyspnea or other complaints. As per pulmonary recommendations, he will be transitioned to oral prednisone taper over the next week. He has follow-up appointment with pulmonology.  Active Problems:   Atrial fibrillation with rapid ventricular response (HCC) - CHADS2VASC2 6 - with mild troponin elevation and likely secondary to sepsis/demand ischemia rather than ACS pattern  - pt also with known CAD - Cardizem drip discontinued. Rate now controlled on verapamil.  - per cardiology, verapamil is not an ideal choice but no BB given severe COPD - continue Eliquis -dose adjusted per pharmacy. - cardiology follow-up appreciated-signed off 2/7. Patient had been started on aspirin  81 MG daily during the course of this admission which was discontinued  at discharge due to him being on Eliquis which would increase bleeding risk. This was confirmed with cardiology.    New LV dysfunction/moderate-severe MR - MR could be contributing to LV dysfunction - not a good candidate for open surgical procedure - Clinically euvolemic.    Acute kidney injury, hyponatremia  - unclear etiology, noted on blood work 2/5 -  resolved.    CAD with elevated troponin - As per cardiology, known moderate CAD by cath in January 2012. Elevated troponin currently in the setting of COPD exacerbation and rapid A. fib. Echo 07/15/16 with mild global LV systolic dysfunction. No indication for cardiac cath right now. His troponin elevation is likely due to demand ischemia as he has diffuse CAD with heavily calcified coronaries by cath 6 years ago. Cardiology recommended continued medical management and would only plan cath if he develops angina. - Briefly on aspirin 81 MG daily during this hospitalization which was discontinued at discharge. Atorvastatin was changed from 20 mg to 80 mg daily. Discussed with cardiology and they will arrange outpatient follow-up.    Septic shock secondary to RSV bronchitis - Shock resolved.    Hyperkalemia - Resolved after a dose of Kayexalate on 2/6. Potassium high normal at 5. Concern regarding aldosterone worsening his hyperkalemia and hence discontinued Aldactone on 2/8. Potassium has normalized. Discussed with cardiology, agree with discontinuing Aldactone and hold off on starting Lasix until outpatient follow-up with cardiology.  Low TSH  - TSH 0.133 and free T4: 0.77. Clinically euthyroid.? Sic euthyroid. Recommend repeating TSH in 4-6 weeks.  Anemia  - Stable.   Constipation - Bowel regimen.  Patient has had daily BMs for the last couple of days.  Glucose intolerance - Reasonable inpatient glycemic control on SSI. A1c 5.4. No medications at discharge.  Essential hypertension - Uncontrolled. Continue verapamil. Added  hydralazine 10 MG 3 times a day. Better. Close outpatient follow-up and adjustment as deemed necessary. Hesitant to add beta blockers due to COPD, ACEI/ARB due to current presentation with cough in the context of COPD and due to hyperkalemia.    Consultants:   PCCM  Cardiology   Procedures:   PICC line-discontinued prior to discharge.   Discharge Instructions  Discharge Instructions    (HEART FAILURE PATIENTS) Call MD:  Anytime you have any of the following symptoms: 1) 3 pound weight gain in 24 hours or 5 pounds in 1 week 2) shortness of breath, with or without a dry hacking cough 3) swelling in the hands, feet or stomach 4) if you have to sleep on extra pillows at night in order to breathe.    Complete by:  As directed    Call MD for:  difficulty breathing, headache or visual disturbances    Complete by:  As directed    Call MD for:  extreme fatigue    Complete by:  As directed    Call MD for:  persistant dizziness or light-headedness    Complete by:  As directed    Call MD for:  severe uncontrolled pain    Complete by:  As directed    Call MD for:  temperature >100.4    Complete by:  As directed    Diet - low sodium heart healthy    Complete by:  As directed    Increase activity slowly    Complete by:  As directed        Medication List  STOP taking these medications   doxycycline 100 MG tablet Commonly known as:  VIBRA-TABS   spironolactone 25 MG tablet Commonly known as:  ALDACTONE     TAKE these medications   acetaminophen 650 MG CR tablet Commonly known as:  TYLENOL Take 1,300 mg by mouth 2 (two) times daily.   albuterol 108 (90 Base) MCG/ACT inhaler Commonly known as:  PROAIR HFA Inhale 2 puffs into the lungs every 6 (six) hours as needed for wheezing or shortness of breath.   atorvastatin 80 MG tablet Commonly known as:  LIPITOR Take 1 tablet (80 mg total) by mouth daily. What changed:  See the new instructions.   azelastine 0.1 % nasal  spray Commonly known as:  ASTELIN Place 2 sprays into both nostrils at bedtime. Use in each nostril as directed   cetirizine 10 MG tablet Commonly known as:  ZYRTEC Take 10 mg by mouth at bedtime.   cholecalciferol 1000 units tablet Commonly known as:  VITAMIN D Take 1,000 Units by mouth daily.   ELIQUIS 5 MG Tabs tablet Generic drug:  apixaban Take 5 mg by mouth 2 (two) times daily.   escitalopram 20 MG tablet Commonly known as:  LEXAPRO Take 20 mg by mouth at bedtime.   fenofibrate 160 MG tablet Take 1 tablet (160 mg total) by mouth at bedtime.   guaiFENesin 600 MG 12 hr tablet Commonly known as:  MUCINEX Take 1 tablet (600 mg total) by mouth 2 (two) times daily.   guaiFENesin-dextromethorphan 100-10 MG/5ML syrup Commonly known as:  ROBITUSSIN DM Take 5 mLs by mouth every 4 (four) hours as needed for cough.   hydrALAZINE 10 MG tablet Commonly known as:  APRESOLINE Take 1 tablet (10 mg total) by mouth 3 (three) times daily.   lamoTRIgine 150 MG tablet Commonly known as:  LAMICTAL Take 150 mg by mouth at bedtime. Reported on 08/14/2015   multivitamin tablet Take 1 tablet by mouth at bedtime.   pantoprazole 40 MG tablet Commonly known as:  PROTONIX TAKE 1 TABLET (40 MG TOTAL) BY MOUTH DAILY BEFORE BREAKFAST.   predniSONE 10 MG tablet Commonly known as:  DELTASONE 4 tabs daily for 2 days, then 3 tabs daily for 2 days, 2 tabs daily for 2 days, then 1 tab daily for 2 days, then stop Start taking on:  07/21/2016 What changed:  additional instructions   STIOLTO RESPIMAT 2.5-2.5 MCG/ACT Aers Generic drug:  Tiotropium Bromide-Olodaterol INHALE 2 PUFFS INTO THE LUNGS DAILY   SUPER B COMPLEX PO Take 1 tablet by mouth at bedtime.   triamcinolone cream 0.1 % Commonly known as:  KENALOG Apply 1 application topically 2 (two) times daily as needed (for skin).   verapamil 240 MG CR tablet Commonly known as:  CALAN-SR Take 1 tablet (240 mg total) by mouth at bedtime.       Follow-up Information    [name], MD. Schedule an appointment as soon as possible for a visit in 5 day(s).   Specialties:  Internal Medicine, Pediatrics Why:  To be seen with repeat labs (CBC & BMP). Contact information: [place] [address] [place], MD Follow up.   Specialty:  Cardiology Why:  MDs office will call to arrange follow-up. Please call them if you don't hear back from them in 2-3 days. Contact information: [address] [place] [address] [phone]  Allergies  Allergen Reactions  . Penicillins Itching    Has patient had a PCN reaction causing immediate rash, facial/tongue/throat swelling, SOB or lightheadedness with hypotension: Yes Has patient had a PCN reaction causing severe rash involving mucus membranes or skin necrosis: No Has patient had a PCN reaction that required hospitalization No Has patient had a PCN reaction occurring within the last 10 years: No If all of the above answers are "NO", then may proceed with Cephalosporin use.   . Sulfonamide Derivatives Other (See Comments)    "it affected my kidneys"  . Tape Other (See Comments)    Arms black and blue, tears skin.  Please use "paper" tape     Procedures/Studies: Dg Chest Port 1 View  Result Date: 07/16/2016 CLINICAL DATA:  PICC line placement EXAM: PORTABLE CHEST 1 VIEW COMPARISON:  07/16/2016 CXR FINDINGS: Stable cardiomegaly with aortic atherosclerosis. New right-sided PICC line tip in the distal SVC. Atelectasis at the left lung base. No pneumonic consolidation or overt pulmonary edema. Probable tiny left pleural effusion. No suspicious osseous abnormalities. Old right mid clavicular fracture. Calcific tendinopathy of the left rotator cuff. IMPRESSION: 1. PICC line tip in the distal SVC. 2. Stable cardiomegaly with aortic atherosclerosis. 3. Left basilar atelectasis with probable trace left effusion.  Electronically Signed   By: Ashley Royalty M.D.   On: 07/16/2016 21:01   Dg Chest Port 1 View  Result Date: 07/16/2016 CLINICAL DATA:  Acute respiratory failure. Cough and shortness of breath and chest congestion. EXAM: PORTABLE CHEST 1 VIEW COMPARISON:  07/13/2016 and 08/14/2015 FINDINGS: Chronic cardiomegaly with aortic atherosclerosis. Pulmonary vascularity is normal and the lungs are clear. No acute bone abnormality. Old fracture of the right clavicle. IMPRESSION: No acute abnormality.  Chronic cardiomegaly. Aortic atherosclerosis. Electronically Signed   By: Lorriane Shire M.D.   On: 07/16/2016 07:59   Dg Chest Portable 1 View  Result Date: 07/13/2016 CLINICAL DATA:  Shortness of breath. Diagnosed with bronchitis yesterday. Wheezing. Fall. EXAM: PORTABLE CHEST 1 VIEW COMPARISON:  Chest radiographs 08/14/2015 FINDINGS: Chronic hyperinflation and bronchial thickening. The heart is enlarged, stable from prior. Atherosclerosis of the thoracic aorta. Subsegmental left basilar atelectasis. No confluent consolidation, pulmonary edema, pleural effusion or pneumothorax. The bones are under mineralized, remote right clavicle fracture. Remote right rib fractures. IMPRESSION: Chronic hyperinflation and bronchial thickening. Chronic cardiomegaly.  Thoracic aortic atherosclerosis. Electronically Signed   By: Jeb Levering M.D.   On: 07/13/2016 23:21      Subjective: States that he feels fine and looking forward to going home this morning. Denied dyspnea both at rest and with activity. Had BM this morning. Minimal occasional dry cough. No chest pain, palpitations, dizziness or lightheadedness.  Discharge Exam:  Vitals:   07/19/16 2114 07/20/16 0440 07/20/16 1016 07/20/16 1020  BP: (!) 164/85 (!) 157/77 (!) 166/85   Pulse: 96 73 98   Resp: 20 (!) 22 20   Temp: 98.2 F (36.8 C) 98.9 F (37.2 C) 98.1 F (36.7 C)   TempSrc: Oral  Oral   SpO2: 100% 99% 100% 96%  Weight:  72.1 kg (159 lb)    Height:         General exam: Pleasant elderly male sitting up comfortably in bed this morning. Respiratory system: Clear to auscultation without wheezing, rhonchi or crackles. No increased work of breathing.  Cardiovascular system:S1 and S2 heard, irregularly irregular. No JVD, murmurs or pedal edema.  Gastrointestinal system: Abdomen is nondistended, soft and nontender. No organomegaly. Normal bowel sounds heard.  Central nervous system: Alert and oriented. No focal neurological deficits. Psychiatry: Judgement and insight appear normal. Mood & affect appropriate.     The results of significant diagnostics from this hospitalization (including imaging, microbiology, ancillary and laboratory) are listed below for reference.     Microbiology: Recent Results (from the past 240 hour(s))  Respiratory Panel by PCR     Status: Abnormal   Collection Time: 07/14/16 12:09 AM  Result Value Ref Range Status   Adenovirus NOT DETECTED NOT DETECTED Final   Coronavirus 229E NOT DETECTED NOT DETECTED Final   Coronavirus HKU1 NOT DETECTED NOT DETECTED Final   Coronavirus NL63 NOT DETECTED NOT DETECTED Final   Coronavirus OC43 NOT DETECTED NOT DETECTED Final   Metapneumovirus NOT DETECTED NOT DETECTED Final   Rhinovirus / Enterovirus NOT DETECTED NOT DETECTED Final   Influenza A NOT DETECTED NOT DETECTED Final   Influenza B NOT DETECTED NOT DETECTED Final   Parainfluenza Virus 1 NOT DETECTED NOT DETECTED Final   Parainfluenza Virus 2 NOT DETECTED NOT DETECTED Final   Parainfluenza Virus 3 NOT DETECTED NOT DETECTED Final   Parainfluenza Virus 4 NOT DETECTED NOT DETECTED Final   Respiratory Syncytial Virus DETECTED (A) NOT DETECTED Final    Comment: CRITICAL RESULT CALLED TO, READ BACK BY AND VERIFIED WITH: Adora Fridge AT 1761 07/15/16 BY L BENFIELD    Bordetella pertussis NOT DETECTED NOT DETECTED Final   Chlamydophila pneumoniae NOT DETECTED NOT DETECTED Final   Mycoplasma pneumoniae NOT DETECTED NOT DETECTED  Final  Culture, blood (routine x 2) Call MD if unable to obtain prior to antibiotics being given     Status: None   Collection Time: 07/14/16  2:10 AM  Result Value Ref Range Status   Specimen Description BLOOD RIGHT ARM  Final   Special Requests BOTTLES DRAWN AEROBIC AND ANAEROBIC 5ML  Final   Culture NO GROWTH 5 DAYS  Final   Report Status 07/19/2016 FINAL  Final  Culture, blood (routine x 2) Call MD if unable to obtain prior to antibiotics being given     Status: None   Collection Time: 07/14/16  2:15 AM  Result Value Ref Range Status   Specimen Description BLOOD RIGHT HAND  Final   Special Requests BOTTLES DRAWN AEROBIC AND ANAEROBIC 5ML  Final   Culture NO GROWTH 5 DAYS  Final   Report Status 07/19/2016 FINAL  Final  MRSA PCR Screening     Status: None   Collection Time: 07/14/16  2:37 PM  Result Value Ref Range Status   MRSA by PCR NEGATIVE NEGATIVE Final    Comment:        The GeneXpert MRSA Assay (FDA approved for NASAL specimens only), is one component of a comprehensive MRSA colonization surveillance program. It is not intended to diagnose MRSA infection nor to guide or monitor treatment for MRSA infections.   Culture, expectorated sputum-assessment     Status: None   Collection Time: 07/15/16  9:18 PM  Result Value Ref Range Status   Specimen Description SPUTUM  Final   Special Requests NONE  Final   Sputum evaluation THIS SPECIMEN IS ACCEPTABLE FOR SPUTUM CULTURE  Final   Report Status 07/16/2016 FINAL  Final  Culture, respiratory (NON-Expectorated)     Status: None   Collection Time: 07/15/16  9:18 PM  Result Value Ref Range Status   Specimen Description SPUTUM  Final   Special Requests NONE Reflexed from [id number]  Final   Gram Stain   Final  ABUNDANT WBC PRESENT, PREDOMINANTLY PMN MODERATE GRAM POSITIVE COCCI IN CLUSTERS FEW YEAST RARE GRAM POSITIVE RODS    Culture Consistent with normal respiratory flora.  Final   Report Status 07/18/2016 FINAL  Final      Labs: BNP (last 3 results)  Recent Labs  07/13/16 2303  BNP 291.9*   Basic Metabolic Panel:  Recent Labs Lab 07/16/16 0222 07/17/16 1605 07/17/16 2024 07/18/16 0430 07/19/16 0423 07/20/16 0500  NA 133* 132*  --  134* 135 137  K 4.8 5.5* 5.4* 5.0 5.0 4.7  CL 102 98*  --  99* 98* 99*  CO2 22 24  --  27 29 31   GLUCOSE 156* 148*  --  141* 103* 105*  BUN 67* 78*  --  66* 48* 38*  CREATININE 1.51* 1.57*  --  1.23 0.93 0.93  CALCIUM 9.9 9.8  --  9.7 9.9 9.2  MG  --   --   --  2.5*  --   --   PHOS  --   --   --  3.7  --   --    Liver Function Tests:  Recent Labs Lab 07/18/16 0430  AST 54*  ALT 47  ALKPHOS 55  BILITOT 0.8  PROT 6.5  ALBUMIN 3.6    CBC:  Recent Labs Lab 07/14/16 0210 07/15/16 0636 07/16/16 0222 07/17/16 0500 07/18/16 0430  WBC 13.8* 12.2* 14.0* 10.9* 11.3*  HGB 10.3* 10.1* 9.7* 8.9* 9.1*  HCT 32.1* 31.4* 30.0* 27.9* 28.4*  MCV 90.2 90.2 90.4 90.6 89.6  PLT 188 181 201 192 203   Cardiac Enzymes:  Recent Labs Lab 07/14/16 0210 07/14/16 0715 07/14/16 1322  TROPONINI 0.48* 2.69* 3.90*    CBG:  Recent Labs Lab 07/19/16 1238 07/19/16 1726 07/19/16 2135 07/20/16 0753 07/20/16 1213  GLUCAP 96 119* 112* 156* 92   Hgb A1c  Recent Labs  07/18/16 1837  HGBA1C 5.4   Thyroid function studies  Recent Labs  07/18/16 0435  TSH 0.133*       Time coordinating discharge: Over 30 minutes  SIGNED:  Vernell Leep, MD, FACP, FHM. [facility] Pager [phone]  If 7PM-7AM, please contact night-coverage [email or web address] Password [email or web address] 07/20/2016, 4:00 PM

## 2016-07-20 NOTE — Progress Notes (Signed)
Eduardo [name] to be D/C'd Home per MD order.  Discussed prescriptions and follow up appointments with the patient. Prescriptions given to patient, medication list explained in detail. Pt verbalized understanding.  Allergies as of 07/20/2016      Reactions   Penicillins Itching   Has patient had a PCN reaction causing immediate rash, facial/tongue/throat swelling, SOB or lightheadedness with hypotension: Yes Has patient had a PCN reaction causing severe rash involving mucus membranes or skin necrosis: No Has patient had a PCN reaction that required hospitalization No Has patient had a PCN reaction occurring within the last 10 years: No If all of the above answers are "NO", then may proceed with Cephalosporin use.   Sulfonamide Derivatives Other (See Comments)   "it affected my kidneys"   Tape Other (See Comments)   Arms black and blue, tears skin.  Please use "paper" tape      Medication List    STOP taking these medications   doxycycline 100 MG tablet Commonly known as:  VIBRA-TABS   spironolactone 25 MG tablet Commonly known as:  ALDACTONE     TAKE these medications   acetaminophen 650 MG CR tablet Commonly known as:  TYLENOL Take 1,300 mg by mouth 2 (two) times daily.   albuterol 108 (90 Base) MCG/ACT inhaler Commonly known as:  PROAIR HFA Inhale 2 puffs into the lungs every 6 (six) hours as needed for wheezing or shortness of breath.   atorvastatin 80 MG tablet Commonly known as:  LIPITOR Take 1 tablet (80 mg total) by mouth daily. What changed:  See the new instructions.   azelastine 0.1 % nasal spray Commonly known as:  ASTELIN Place 2 sprays into both nostrils at bedtime. Use in each nostril as directed   cetirizine 10 MG tablet Commonly known as:  ZYRTEC Take 10 mg by mouth at bedtime.   cholecalciferol 1000 units tablet Commonly known as:  VITAMIN D Take 1,000 Units by mouth daily.   ELIQUIS 5 MG Tabs tablet Generic drug:  apixaban Take 5 mg by mouth 2  (two) times daily.   escitalopram 20 MG tablet Commonly known as:  LEXAPRO Take 20 mg by mouth at bedtime.   fenofibrate 160 MG tablet Take 1 tablet (160 mg total) by mouth at bedtime.   guaiFENesin 600 MG 12 hr tablet Commonly known as:  MUCINEX Take 1 tablet (600 mg total) by mouth 2 (two) times daily.   guaiFENesin-dextromethorphan 100-10 MG/5ML syrup Commonly known as:  ROBITUSSIN DM Take 5 mLs by mouth every 4 (four) hours as needed for cough.   hydrALAZINE 10 MG tablet Commonly known as:  APRESOLINE Take 1 tablet (10 mg total) by mouth 3 (three) times daily.   lamoTRIgine 150 MG tablet Commonly known as:  LAMICTAL Take 150 mg by mouth at bedtime. Reported on 08/14/2015   multivitamin tablet Take 1 tablet by mouth at bedtime.   pantoprazole 40 MG tablet Commonly known as:  PROTONIX TAKE 1 TABLET (40 MG TOTAL) BY MOUTH DAILY BEFORE BREAKFAST.   predniSONE 10 MG tablet Commonly known as:  DELTASONE 4 tabs daily for 2 days, then 3 tabs daily for 2 days, 2 tabs daily for 2 days, then 1 tab daily for 2 days, then stop Start taking on:  07/21/2016 What changed:  additional instructions   STIOLTO RESPIMAT 2.5-2.5 MCG/ACT Aers Generic drug:  Tiotropium Bromide-Olodaterol INHALE 2 PUFFS INTO THE LUNGS DAILY   SUPER B COMPLEX PO Take 1 tablet by mouth at bedtime.  triamcinolone cream 0.1 % Commonly known as:  KENALOG Apply 1 application topically 2 (two) times daily as needed (for skin).   verapamil 240 MG CR tablet Commonly known as:  CALAN-SR Take 1 tablet (240 mg total) by mouth at bedtime.       Vitals:   07/20/16 1016 07/20/16 1629  BP: (!) 166/85 (!) 156/74  Pulse: 98   Resp: 20   Temp: 98.1 F (36.7 C)     Skin clean, dry and intact without evidence of skin break down, no evidence of skin tears noted.  An After Visit Summary was printed and given to the patient. Patient escorted via [place], and D/C home via private auto.  Dixie Dials RN, BSN

## 2016-07-20 NOTE — Progress Notes (Signed)
Patient ambulated in hall on room air. O2 saturation maintained between 96-99%. Patient reported no shortness of breath while ambulating. MD notified.

## 2016-07-20 NOTE — Progress Notes (Signed)
Physical Therapy Treatment Patient Details Name: Eduardo [name] MRN: [id number] DOB: [birth year] Today's Date: 07/20/2016    History of Present Illness Patient is an 83 yo male admitted 07/13/16 with acute resp failure with hypoxia.  CHF/COPD exacerbation, Afib, elevated troponin (per chart demand ischemia), and hyperkalemia.    PMH:  CHF, COPD, HLD, anemia, anxiety, Afib, CAD, MI, CVA, neuropathy, CKD    PT Comments    Pt continues to need cues for energy conservation and pacing. Con't to recommend HHPT.  Follow Up Recommendations  Home health PT     Equipment Recommendations  None recommended by PT    Recommendations for Other Services       Precautions / Restrictions Precautions Precautions: Fall Precaution Comments: watch SPO2 Restrictions Weight Bearing Restrictions: No    Mobility  Bed Mobility   Bed Mobility: Supine to Sit     Supine to sit: Supervision;HOB elevated        Transfers Overall transfer level: Needs assistance Equipment used: 4-wheeled walker Transfers: Sit to/from Stand Sit to Stand: Supervision         General transfer comment: cues for hand placement and pt still did not follow cues  Ambulation/Gait Ambulation/Gait assistance: Min guard Ambulation Distance (Feet): 108 Feet Assistive device: 4-wheeled walker Gait Pattern/deviations: Step-through pattern;Decreased step length - right;Decreased step length - left;Decreased stride length;Trunk flexed     General Gait Details: Pt able to ambulate without a rest break.  Amb earlier with nursing and no need for o2.  Cues for pacing and discussed home energy conservation.   Stairs            Wheelchair Mobility    Modified Rankin (Stroke Patients Only)       Balance             Standing balance-Leahy Scale: Poor                      Cognition Arousal/Alertness: Awake/alert Behavior During Therapy: WFL for tasks assessed/performed              Safety/Judgement: Decreased awareness of safety;Decreased awareness of deficits     General Comments: pt needing cues for pacing and safety with rollator    Exercises      General Comments        Pertinent Vitals/Pain Pain Assessment: No/denies pain    Home Living                      Prior Function            PT Goals (current goals can now be found in the care plan section) Acute Rehab PT Goals Patient Stated Goal: return home, breathe better PT Goal Formulation: With patient Time For Goal Achievement: 07/22/16 Potential to Achieve Goals: Good Progress towards PT goals: Progressing toward goals    Frequency    Min 3X/week      PT Plan Current plan remains appropriate    Co-evaluation             End of Session Equipment Utilized During Treatment: Gait belt Activity Tolerance: Patient tolerated treatment well Patient left: in chair;with call bell/phone within reach (set up for lunch tray)     Time: 1209-1223 PT Time Calculation (min) (ACUTE ONLY): 14 min  Charges:  $Gait Training: 8-22 mins                    G Codes:  Eduardo [name] 07/20/2016, 1:05 PM

## 2016-07-23 ENCOUNTER — Telehealth: Payer: Self-pay | Admitting: Interventional Cardiology

## 2016-07-23 ENCOUNTER — Telehealth: Payer: Self-pay

## 2016-07-23 ENCOUNTER — Telehealth: Payer: Self-pay | Admitting: Emergency Medicine

## 2016-07-23 ENCOUNTER — Encounter: Payer: Self-pay | Admitting: Physician Assistant

## 2016-07-23 DIAGNOSIS — I4891 Unspecified atrial fibrillation: Secondary | ICD-10-CM | POA: Diagnosis not present

## 2016-07-23 DIAGNOSIS — Z7951 Long term (current) use of inhaled steroids: Secondary | ICD-10-CM | POA: Diagnosis not present

## 2016-07-23 DIAGNOSIS — N183 Chronic kidney disease, stage 3 (moderate): Secondary | ICD-10-CM | POA: Diagnosis not present

## 2016-07-23 DIAGNOSIS — Z7952 Long term (current) use of systemic steroids: Secondary | ICD-10-CM | POA: Diagnosis not present

## 2016-07-23 DIAGNOSIS — I130 Hypertensive heart and chronic kidney disease with heart failure and stage 1 through stage 4 chronic kidney disease, or unspecified chronic kidney disease: Secondary | ICD-10-CM | POA: Diagnosis not present

## 2016-07-23 DIAGNOSIS — J441 Chronic obstructive pulmonary disease with (acute) exacerbation: Secondary | ICD-10-CM | POA: Diagnosis not present

## 2016-07-23 DIAGNOSIS — J440 Chronic obstructive pulmonary disease with acute lower respiratory infection: Secondary | ICD-10-CM | POA: Diagnosis not present

## 2016-07-23 DIAGNOSIS — J210 Acute bronchiolitis due to respiratory syncytial virus: Secondary | ICD-10-CM | POA: Diagnosis not present

## 2016-07-23 DIAGNOSIS — Z7901 Long term (current) use of anticoagulants: Secondary | ICD-10-CM | POA: Diagnosis not present

## 2016-07-23 DIAGNOSIS — G629 Polyneuropathy, unspecified: Secondary | ICD-10-CM | POA: Diagnosis not present

## 2016-07-23 DIAGNOSIS — I5032 Chronic diastolic (congestive) heart failure: Secondary | ICD-10-CM | POA: Diagnosis not present

## 2016-07-23 DIAGNOSIS — D649 Anemia, unspecified: Secondary | ICD-10-CM | POA: Diagnosis not present

## 2016-07-23 DIAGNOSIS — J9621 Acute and chronic respiratory failure with hypoxia: Secondary | ICD-10-CM | POA: Diagnosis not present

## 2016-07-23 DIAGNOSIS — E785 Hyperlipidemia, unspecified: Secondary | ICD-10-CM | POA: Diagnosis not present

## 2016-07-23 DIAGNOSIS — I2510 Atherosclerotic heart disease of native coronary artery without angina pectoris: Secondary | ICD-10-CM | POA: Diagnosis not present

## 2016-07-23 DIAGNOSIS — F419 Anxiety disorder, unspecified: Secondary | ICD-10-CM | POA: Diagnosis not present

## 2016-07-23 NOTE — Telephone Encounter (Signed)
Attempted to reach pt to confirm post-hospital follow-up  appt. Spoke with spouse Eduardo [name]. Pt was resting.   Due to follow-up appt being scheduled within mandated timeframe after discharge, PCP will complete TCM.

## 2016-07-23 NOTE — Telephone Encounter (Signed)
lmtcb for [employer].

## 2016-07-23 NOTE — Telephone Encounter (Signed)
New message  Pt wife call requesting to speak with RN. Pt wife states pt was recently released from hospital would like pt to be seen by only Dr. Tamala Julian. Please call back to discuss

## 2016-07-23 NOTE — Telephone Encounter (Signed)
Spoke with wife and informed her that Dr. Tamala Julian is out of the office this week and is totally booked next week.  Offered PA/NP appt on a day he is in the office.  Wife accepting of this planned.  Scheduled pt to see Bonney Leitz, PA-C on 07/30/16.  Wife appreciative for assistance.

## 2016-07-23 NOTE — Telephone Encounter (Signed)
RB  Please Advise-  Spoke with pt.'s wife and her husband was scheduled for a HFU with TP on 08/03/16. She states she does not want him to see a NP she prefers him to see a MD only. I informed the pt. That they made that appointment due to him needing a 30 min. Slot and you had no available. When tried to reschedule another appointment with you but it would be further out she did not want it and wanted to know if you were willing to fit him into your schedule.  fyi for nurse: please contact pt.'s wife on cell at [phone]

## 2016-07-23 NOTE — Telephone Encounter (Signed)
Pt wife returning call.Stanley A Dalton' °

## 2016-07-23 NOTE — Telephone Encounter (Signed)
Left message to call back  

## 2016-07-24 ENCOUNTER — Encounter: Payer: Self-pay | Admitting: Internal Medicine

## 2016-07-24 ENCOUNTER — Ambulatory Visit (INDEPENDENT_AMBULATORY_CARE_PROVIDER_SITE_OTHER): Payer: PPO | Admitting: Internal Medicine

## 2016-07-24 ENCOUNTER — Telehealth: Payer: Self-pay | Admitting: Internal Medicine

## 2016-07-24 ENCOUNTER — Telehealth: Payer: Self-pay | Admitting: *Deleted

## 2016-07-24 VITALS — BP 136/72 | HR 75 | Temp 97.3°F | Wt 151.0 lb

## 2016-07-24 DIAGNOSIS — I5032 Chronic diastolic (congestive) heart failure: Secondary | ICD-10-CM | POA: Diagnosis not present

## 2016-07-24 DIAGNOSIS — J439 Emphysema, unspecified: Secondary | ICD-10-CM | POA: Diagnosis not present

## 2016-07-24 DIAGNOSIS — I4891 Unspecified atrial fibrillation: Secondary | ICD-10-CM

## 2016-07-24 DIAGNOSIS — J9601 Acute respiratory failure with hypoxia: Secondary | ICD-10-CM | POA: Diagnosis not present

## 2016-07-24 DIAGNOSIS — S61412A Laceration without foreign body of left hand, initial encounter: Secondary | ICD-10-CM | POA: Diagnosis not present

## 2016-07-24 LAB — RENAL FUNCTION PANEL
ALBUMIN: 3.6 g/dL (ref 3.5–5.2)
BUN: 31 mg/dL — AB (ref 6–23)
CO2: 29 mEq/L (ref 19–32)
Calcium: 9.5 mg/dL (ref 8.4–10.5)
Chloride: 102 mEq/L (ref 96–112)
Creatinine, Ser: 0.92 mg/dL (ref 0.40–1.50)
GFR: 83.46 mL/min (ref >60.00)
GLUCOSE: 88 mg/dL (ref 70–99)
Phosphorus: 2.9 mg/dL (ref 2.3–4.6)
Potassium: 4.3 mEq/L (ref 3.5–5.1)
SODIUM: 136 meq/L (ref 135–145)

## 2016-07-24 LAB — CBC WITH DIFFERENTIAL/PLATELET
BASOS ABS: 0 10*3/uL (ref 0.0–0.1)
BASOS PCT: 0.2 % (ref 0.0–3.0)
Eosinophils Absolute: 0.1 10*3/uL (ref 0.0–0.7)
Eosinophils Relative: 0.3 % (ref 0.0–5.0)
HEMATOCRIT: 30.3 % — AB (ref 39.0–52.0)
Hemoglobin: 9.9 g/dL — ABNORMAL LOW (ref 13.0–17.0)
Lymphocytes Relative: 13.7 % (ref 12.0–46.0)
Lymphs Abs: 2.9 10*3/uL (ref 0.7–4.0)
MCHC: 32.6 g/dL (ref 30.0–36.0)
MCV: 90.4 fl (ref 78.0–100.0)
MONO ABS: 1.5 10*3/uL — AB (ref 0.1–1.0)
Monocytes Relative: 7.1 % (ref 3.0–12.0)
NEUTROS PCT: 78.7 % — AB (ref 43.0–77.0)
Neutro Abs: 16.8 10*3/uL — ABNORMAL HIGH (ref 1.4–7.7)
PLATELETS: 341 10*3/uL (ref 150.0–400.0)
RBC: 3.36 Mil/uL — ABNORMAL LOW (ref 4.22–5.81)
RDW: 14.9 % (ref 11.5–15.5)

## 2016-07-24 LAB — T4, FREE: Free T4: 1.06 ng/dL (ref 0.60–1.60)

## 2016-07-24 NOTE — Telephone Encounter (Signed)
Rhonda from [facility] said pt fell prior to coming to appt at [facility] earlier today; while at [facility] hand was cleaned and dressed.Suanne Marker said pts wife said it has been bleeding for 3 hours and she cannot get it to stop bleeding by applying pressure.Mrs Nishikawa wants pt to have a few stitches.Pt is on Eliquis. There are no available appts at [facility]. Dr [name] is not in office this afternoon and I spoke with Dr Darnell Level and he advised for pt to go to UC to be evaluated for possible stitches. Rhonda voiced understanding and will let Mrs Eoff be aware. FYI to Dr [name].

## 2016-07-24 NOTE — Telephone Encounter (Signed)
CRITICAL VALUE STICKER  CRITICAL VALUE: WBC 21.4  RECEIVER: Marion NOTIFIED: 07/24/16 2:46  MESSENGER : [facility]  MD NOTIFIED: Diona Browner, Dr. [name] is out of office, but message also sent to him  TIME OF NOTIFICATION: 2:51  RESPONSE:

## 2016-07-24 NOTE — Progress Notes (Signed)
Subjective:    Patient ID: Eduardo [name], male    DOB: 07-Mar-1931, 83 y.o.   MRN: [id number]  HPI Here with wife for hospital follow up Reviewed extensive records  Breathing had worsened despite Rx while outpatient Wife concerned that he was not treated as aggressively as he should have been Was on doxy and prednisone He got very sick --wife thinks he should have been hospitalized from the office there RSV infection noted--- empiric antibiotics Aggressive Rx for the COPD Was on oxygen but able to be weaned off Breathing is much better now---finally starting to bring up a lot of the mucus stuck there Wheeze is gone Still some dyspnea with ADLs  Then had rapid atrial fibrillation This was treated with verapamil No palpitations or chest pain now  Abnormal TSH in hospital Euthyroid sick?  Some AKI noted Renal function did come back to normal  Fell this morning-- got coat out of closed and tried to put it on and lost balance Fell onto left hand and side Several bleeding spots covered with gauze here  Current Outpatient Prescriptions on File Prior to Visit  Medication Sig Dispense Refill  . acetaminophen (TYLENOL) 650 MG CR tablet Take 1,300 mg by mouth 2 (two) times daily.    Marland Kitchen albuterol (PROAIR HFA) 108 (90 Base) MCG/ACT inhaler Inhale 2 puffs into the lungs every 6 (six) hours as needed for wheezing or shortness of breath. 18 g 0  . apixaban (ELIQUIS) 5 MG TABS tablet Take 5 mg by mouth 2 (two) times daily.    Marland Kitchen atorvastatin (LIPITOR) 80 MG tablet Take 1 tablet (80 mg total) by mouth daily. 30 tablet 0  . azelastine (ASTELIN) 0.1 % nasal spray Place 2 sprays into both nostrils at bedtime. Use in each nostril as directed    . B Complex-C (SUPER B COMPLEX PO) Take 1 tablet by mouth at bedtime.     . cetirizine (ZYRTEC) 10 MG tablet Take 10 mg by mouth at bedtime.     . cholecalciferol (VITAMIN D) 1000 units tablet Take 1,000 Units by mouth daily.    Marland Kitchen escitalopram  (LEXAPRO) 20 MG tablet Take 20 mg by mouth at bedtime.     . fenofibrate 160 MG tablet Take 1 tablet (160 mg total) by mouth at bedtime. 90 tablet 3  . guaiFENesin (MUCINEX) 600 MG 12 hr tablet Take 1 tablet (600 mg total) by mouth 2 (two) times daily. 15 tablet 0  . guaiFENesin-dextromethorphan (ROBITUSSIN DM) 100-10 MG/5ML syrup Take 5 mLs by mouth every 4 (four) hours as needed for cough. 118 mL 0  . hydrALAZINE (APRESOLINE) 10 MG tablet Take 1 tablet (10 mg total) by mouth 3 (three) times daily. 90 tablet 0  . lamoTRIgine (LAMICTAL) 150 MG tablet Take 150 mg by mouth at bedtime. Reported on 08/14/2015    . Multiple Vitamin (MULTIVITAMIN) tablet Take 1 tablet by mouth at bedtime.     . pantoprazole (PROTONIX) 40 MG tablet TAKE 1 TABLET (40 MG TOTAL) BY MOUTH DAILY BEFORE BREAKFAST. 90 tablet 3  . predniSONE (DELTASONE) 10 MG tablet 4 tabs daily for 2 days, then 3 tabs daily for 2 days, 2 tabs daily for 2 days, then 1 tab daily for 2 days, then stop 20 tablet 0  . STIOLTO RESPIMAT 2.5-2.5 MCG/ACT AERS INHALE 2 PUFFS INTO THE LUNGS DAILY 12 Inhaler 3  . triamcinolone cream (KENALOG) 0.1 % Apply 1 application topically 2 (two) times daily as needed (for skin).     Marland Kitchen  verapamil (CALAN-SR) 240 MG CR tablet Take 1 tablet (240 mg total) by mouth at bedtime. 90 tablet 1   No current facility-administered medications on file prior to visit.     Allergies  Allergen Reactions  . Penicillins Itching    Has patient had a PCN reaction causing immediate rash, facial/tongue/throat swelling, SOB or lightheadedness with hypotension: Yes Has patient had a PCN reaction causing severe rash involving mucus membranes or skin necrosis: No Has patient had a PCN reaction that required hospitalization No Has patient had a PCN reaction occurring within the last 10 years: No If all of the above answers are "NO", then may proceed with Cephalosporin use.   . Sulfonamide Derivatives Other (See Comments)    "it affected my  kidneys"  . Tape Other (See Comments)    Arms black and blue, tears skin.  Please use "paper" tape    Past Medical History:  Diagnosis Date  . Allergy   . Anemia 2012, 08/2015   chronic. Acute due to large hematoma 2013. 2017: due to gastric AVM bleeding.   . Anxiety   . Atrial fibrillation ([place]) 2012   a.  CHA2DS2VASc = 6-->eliquis;  b. 12/2015 Echo: EF 60-65%, no rwma, LVH, mild AS/MS/MR, sev dil LA, mild TR, PASP 76mmHg.  Marland Kitchen CAD (coronary artery disease)    a. 06/2010 Cath: LM nl, LAD 50p/m, LCX 65m, RCA 50p/m -->catheter dissection but good flow, 70d, RPDA 50-70;  b. 06/2010 Relook cath due to bradycardia and inf ST elev: RCA dissection @ ostium extending into coronary cusp and prox RCA, Ao dissection-->less staining than previously-->Med Rx.  Marland Kitchen COPD (chronic obstructive pulmonary disease) ([place]) 2013   exertional dyspnea  . Gastric angiodysplasia with hemorrhage 08/2015  . Hx of colonic polyps 2001, 2011   adenomatous 2001. HP 2001, 2011.  Marland Kitchen Hyperlipidemia   . Myocardial infarction 06/2010.  12/2011.   "twice; 1 day apart; during/after cath". NQMI in setting severe anemia 2013.   Marland Kitchen Neuropathy ([place]) 2014   in feet, likely due to spinal stenosis, DDD, HNP  . Osteoarthritis 2002   spinal stenosis, spondylolisthesis, disc protrusion multilevel in lumbar spine  . Stroke ([place]) 2016  . Upper GI bleed 08/2015   EGD: 2 small gastric AVMs, 1 actively bleeding.  Both treated with APC ablation, clipping of the bleeder    Past Surgical History:  Procedure Laterality Date  . APPENDECTOMY    . CARDIAC CATHETERIZATION  2012   50% LAD and luminal irreg in RCA, 1/12  Post cath MI from damage  . CARPAL TUNNEL RELEASE     left  . CARPAL TUNNEL RELEASE  10/11/11   Dr Rush Barer  . CATARACT EXTRACTION W/ INTRAOCULAR LENS IMPLANT  2/13   Dr [name]  . COLONOSCOPY  2001, 02/2010   Dr Deatra Ina.   . ESOPHAGOGASTRODUODENOSCOPY N/A 08/15/2015   [name] MD; 2 small gastric AVMs, 1 actively  bleeding.  Both treated with APC ablation, clipping of the bleeder  . FOOT FRACTURE SURGERY     right heel repair  . FRACTURE SURGERY    . KNEE ARTHROSCOPY     left, right knee 10/2009  . MASTOIDECTOMY     x3 on right  . NASAL SEPTUM SURGERY     nasal septal repair  . PENILE PROSTHESIS  REMOVAL    . PENILE PROSTHESIS IMPLANT     x 2--got infection after 2nd and had to remove  . PILONIDAL CYST / SINUS EXCISION    .  SHOULDER ARTHROSCOPY     right  . TONSILLECTOMY AND ADENOIDECTOMY      Family History  Problem Relation Age of Onset  . Cancer Mother     ? leukemia  . Heart attack Father   . Diabetes Neg Hx   . Hypertension Neg Hx     Social History   Social History  . Marital status: Married    Spouse name: N/A  . Number of children: 4  . Years of education: N/A   Occupational History  . Retired Chief Strategy Officer the job trainin)    Social History Main Topics  . Smoking status: Former Smoker    Packs/day: 2.50    Years: 30.00    Types: Cigarettes    Quit date: 06/11/1978  . Smokeless tobacco: Never Used  . Alcohol use No     Comment: QUIT [id number] alcoholic  . Drug use: No  . Sexual activity: Not Currently   Other Topics Concern  . Not on file   Social History Narrative   Grew up in [place], [place].  Lives in [place].   Married---2nd   2 children ([place], [place]  and near [place])--2 stepchildren   Former Smoker--quit in 1980   Alcohol use-no. Quit in 1980--alcoholic      Has living will.   Has designated wife, then step daughter [name] have health care POA.   Would accept resuscitation attempts.   No feeding tube if cognitively unaware.   Review of Systems  Appetite is fair Weight down a few pounds Sleeping better---did very well last night (in recliner)     Objective:   Physical Exam  Constitutional: He appears well-nourished. No distress.  Neck: No thyromegaly present.  Cardiovascular: Normal rate.  Exam reveals no gallop.   No  murmur heard. irregular  Pulmonary/Chest: Effort normal. No respiratory distress. He has no rales.  Slightly decreased breath sounds Not tight but slight exp wheeze/rhonchi  Musculoskeletal:  Trace edema  Lymphadenopathy:    He has no cervical adenopathy.  Skin:  Massive ecchymosis on right arm. No ulcers Several superficial abrasions on left hand          Assessment & Plan:

## 2016-07-24 NOTE — Progress Notes (Signed)
Pre visit review using our clinic review tool, if applicable. No additional management support is needed unless otherwise documented below in the visit note. 

## 2016-07-24 NOTE — Telephone Encounter (Signed)
Pt feeling better per note this AM. Currently on prednisone and wbc may be elevated from this. No clear emergent treatment needed. Will forward to PCP.

## 2016-07-24 NOTE — Telephone Encounter (Signed)
[facility]  Patient Name: Eduardo [name]  DOB: [birth year]    Initial Comment Caller states husband was seen this morning, before leaving he fell cutting his hand. Wife thinks it's deep, MD didn't think so. She left and came back, has COPD, and blood is running all down his arms and blankets and stuff. She is wondering about finally getting a stitch in his hand.    Nurse Assessment  Nurse: Harlow Mares, RN, Suanne Marker Date/Time (Eastern Time): 07/24/2016 3:27:16 PM  Confirm and document reason for call. If symptomatic, describe symptoms. ---Caller states husband was seen this morning, before leaving he fell cutting his hand. Wife thinks it's deep, MD didn't think so. She left and came back, has COPD, and blood is running all down his arms and blankets and stuff. She is wondering about finally getting a stitch in his hand.  Does the patient have any new or worsening symptoms? ---Yes  Will a triage be completed? ---Yes  Related visit to physician within the last 2 weeks? ---Yes  Does the PT have any chronic conditions? (i.e. diabetes, asthma, etc.) ---Yes  List chronic conditions. ---takes blood thinners.  Is this a behavioral health or substance abuse call? ---No     Guidelines    Guideline Title Affirmed Question Affirmed Notes  Cuts and Lacerations [1] Bleeding AND [2] won't stop after 10 minutes of direct pressure (using correct technique)    Final Disposition User   Go to ED Now Harlow Mares, RN, Suanne Marker    Comments  Caller insisted on nurse calling to see if MD could see patient in the office today for stitches. She reports that the MD just got out of the hospital and takes blood thinners. She doesn't want to return to the ED if MD can see hi, in the office. Called backline and spoke with nurse, who spoke with providers and advised to be seen in [facility]. Caller was extrememly upset with this advice, stating that this is rediculous that the MD in the office can't  take 5 mins to stitch this patient's hand. She will be speaking with the office about this.   Referrals  GO TO FACILITY REFUSED   Disagree/Comply: Disagree  Disagree/Comply Reason: Disagree with instructions

## 2016-07-24 NOTE — Assessment & Plan Note (Signed)
Mostly due to RSV pneumonia This is better Off oxygen

## 2016-07-24 NOTE — Telephone Encounter (Signed)
I did not get feedback from team health until read the note that Mrs Bostic was upset. Dr [name] was out of office and I could not find Larene Beach CMA at time when Schneider from [facility] initially called. Since getting the note I found Larene Beach CMA and she said Dr [name] looked at pts hand and said skin was too thin to suture; more like a skin tear. Dr Darnell Level said if pt came by office he would look at hand and if only needs more of a compression dressing Larene Beach will do that but if needs stitches will need to go to UC. I have tried all possible contact #s and spoke with Marlowe Kays who is not no DPR but all I asked her was if she knew another contact # for pt or Mrs Bohlman. Marlowe Kays gave me pts cell [phone] but unable to reach pt there. Marlowe Kays said she would text her mother and see if she would call me at [facility].  I have not heard from any one. FYI to Dr [name]; Dr Danise Mina and Leticia Penna RN team lead. Junie Panning Alexander office mgr is out of office.) Please see 07/24/16 phone note also.

## 2016-07-24 NOTE — Assessment & Plan Note (Signed)
Trouble using MDI when having problems Will speak to pulmonary about nebulizer

## 2016-07-24 NOTE — Assessment & Plan Note (Signed)
Rate better now Continues on eliquis

## 2016-07-24 NOTE — Assessment & Plan Note (Signed)
Mostly compensated Trace edema

## 2016-07-25 NOTE — Telephone Encounter (Signed)
Spoke to wife, [name]. She has settled down from yesterday. She said she was more frustrated with the [facility] set up. I advised her Larence Penning is actively working on the issue with [facility].

## 2016-07-25 NOTE — Telephone Encounter (Signed)
See other phone note

## 2016-07-25 NOTE — Telephone Encounter (Signed)
Left message on cell vm to call back

## 2016-07-25 NOTE — Telephone Encounter (Signed)
I totally understand her frustration. I wish they had been able to find me before it escalated like it did. I was already aware with the situation and knew how to fix it. Left her a message apologizing for everything.

## 2016-07-25 NOTE — Telephone Encounter (Signed)
Please put Mr Eduardo [name] in one of my slots for 08/02/16 in the PM

## 2016-07-25 NOTE — Telephone Encounter (Signed)
Results reviewed and released on MyChart. No evidence of infection at the visit--I suspect the WBC is just from the prednisone

## 2016-07-25 NOTE — Telephone Encounter (Signed)
Wife is calling to follow up about husband's appointment. Wife stated husband is very congested and need to be seen soon as possible.

## 2016-07-25 NOTE — Telephone Encounter (Signed)
Left message to call office

## 2016-07-25 NOTE — Telephone Encounter (Signed)
Patient returned Shannon's call.  Patient went to [facility] walk in. They put a pressure bandage on it and it helped, but she still had problems with the bleeding last night. Eduardo [name] is very upset with how it was handled yesterday.  After Eduardo [name] was sent to [facility] she was transferred to 2 nurses and was made to hold for 30 minutes before the nurse called her back. Patient wasn't able to be seen at our office.

## 2016-07-25 NOTE — Telephone Encounter (Signed)
lmtcb X1 for pt's wife.  

## 2016-07-25 NOTE — Telephone Encounter (Signed)
lmom tcb x1 to wife

## 2016-07-25 NOTE — Telephone Encounter (Signed)
Just check back in with her

## 2016-07-25 NOTE — Telephone Encounter (Signed)
Please check on him this morning and find out what happened

## 2016-07-26 ENCOUNTER — Encounter: Payer: Self-pay | Admitting: Internal Medicine

## 2016-07-26 ENCOUNTER — Ambulatory Visit (INDEPENDENT_AMBULATORY_CARE_PROVIDER_SITE_OTHER): Payer: PPO | Admitting: Internal Medicine

## 2016-07-26 VITALS — BP 138/70 | HR 79 | Temp 98.0°F | Resp 18

## 2016-07-26 DIAGNOSIS — J441 Chronic obstructive pulmonary disease with (acute) exacerbation: Secondary | ICD-10-CM

## 2016-07-26 MED ORDER — PREDNISONE 10 MG PO TABS: ORAL_TABLET | ORAL | 0 refills | Status: DC

## 2016-07-26 NOTE — Assessment & Plan Note (Signed)
Still not back to normal but improving slowly This is likely related to ongoing healing from RSV pneumonia (he can feel the congestion in there still) Will extend the prednisone a few days Reassured that I think he is still slowly improving

## 2016-07-26 NOTE — Progress Notes (Signed)
Cardiology Office Note    Date:  07/30/2016   ID:  Eduardo [name], DOB Dec 30, 1930, MRN [id number]  PCP:  [name], MD  Cardiologist:  Dr. Tamala Julian   CC: post hospital follow up  History of Present Illness:  Eduardo [name] is a 83 y.o. male with a history of CAD (cath 2012 c/b dissection of RCA with ST elevation/temp pacer, medically managed), chronic atrial fib on Eliquis, chronic respiratory failure/COPD, HLD and CVA who presents to clinic for post hospital follow up.   Pt with known moderate CAD by cath in January 2012. This was complicated by dissection of the ostial RCA. He has done well with medical management since then.   He was recently admitted 2/2-07/20/16 with dyspnea, cough and wheezing, found to have AECOPD and + RSV. Troponin was elevated to 3.9. His elevated troponin was felt likely due to demand ischemia. Continued medical management was recommended. Plan for invasive work up was deferred unless he developed angina. Echo 07/15/16 with mild global LV systolic dysfunction (EF 12-19%) with moderate to severe MR, PA pressure 50. He was not felt to be a good candidate for open surgical procedure for severe MR. He also had afib with RVR. His rate was controlled on PO verapamil. Verapamil is not ideal choice given his LV dysfunction but unable to use BBs given his severe COPD. Pharmacy adjusted dosing of his Eliquis. Plans were to follow renal function to determine if his home dose needs to be changed permanently.  Today he presents to clinic for follow up. He has been feeling a lot better since discharge. No CP or SOB. He has chronic LE edema which is not new or worsened. He has been sleeping in a recliner since discharge. No PND. No dizziness or syncope. He is concerned about walking with a cane because he feels unbalanced. No blood in stool or urine. No palpitations. Home PT/OT is coming to his house and helping to mobilize him. Wife here with pt and concerned he is on too  many medications.     Past Medical History:  Diagnosis Date  . Allergy   . Anemia 2012, 08/2015   chronic. Acute due to large hematoma 2013. 2017: due to gastric AVM bleeding.   . Anxiety   . Atrial fibrillation ([place]) 2012   a.  CHA2DS2VASc = 6-->eliquis;  b. 12/2015 Echo: EF 60-65%, no rwma, LVH, mild AS/MS/MR, sev dil LA, mild TR, PASP 46mmHg.  Marland Kitchen CAD (coronary artery disease)    a. 06/2010 Cath: LM nl, LAD 50p/m, LCX 12m, RCA 50p/m -->catheter dissection but good flow, 70d, RPDA 50-70;  b. 06/2010 Relook cath due to bradycardia and inf ST elev: RCA dissection @ ostium extending into coronary cusp and prox RCA, Ao dissection-->less staining than previously-->Med Rx.  Marland Kitchen COPD (chronic obstructive pulmonary disease) ([place]) 2013   exertional dyspnea  . Gastric angiodysplasia with hemorrhage 08/2015  . Hx of colonic polyps 2001, 2011   adenomatous 2001. HP 2001, 2011.  Marland Kitchen Hyperlipidemia   . Myocardial infarction 06/2010.  12/2011.   "twice; 1 day apart; during/after cath". NQMI in setting severe anemia 2013.   Marland Kitchen Neuropathy ([place]) 2014   in feet, likely due to spinal stenosis, DDD, HNP  . Osteoarthritis 2002   spinal stenosis, spondylolisthesis, disc protrusion multilevel in lumbar spine  . Stroke ([place]) 2016  . Upper GI bleed 08/2015   EGD: 2 small gastric AVMs, 1 actively bleeding.  Both treated with APC ablation, clipping of the  bleeder    Past Surgical History:  Procedure Laterality Date  . APPENDECTOMY    . CARDIAC CATHETERIZATION  2012   50% LAD and luminal irreg in RCA, 1/12  Post cath MI from damage  . CARPAL TUNNEL RELEASE     left  . CARPAL TUNNEL RELEASE  10/11/11   Dr Rush Barer  . CATARACT EXTRACTION W/ INTRAOCULAR LENS IMPLANT  2/13   Dr [name]  . COLONOSCOPY  2001, 02/2010   Dr Deatra Ina.   . ESOPHAGOGASTRODUODENOSCOPY N/A 08/15/2015   [name] MD; 2 small gastric AVMs, 1 actively bleeding.  Both treated with APC ablation, clipping of the bleeder  . FOOT  FRACTURE SURGERY     right heel repair  . FRACTURE SURGERY    . KNEE ARTHROSCOPY     left, right knee 10/2009  . MASTOIDECTOMY     x3 on right  . NASAL SEPTUM SURGERY     nasal septal repair  . PENILE PROSTHESIS  REMOVAL    . PENILE PROSTHESIS IMPLANT     x 2--got infection after 2nd and had to remove  . PILONIDAL CYST / SINUS EXCISION    . SHOULDER ARTHROSCOPY     right  . TONSILLECTOMY AND ADENOIDECTOMY      Current Medications: Outpatient Medications Prior to Visit  Medication Sig Dispense Refill  . acetaminophen (TYLENOL) 650 MG CR tablet Take 1,300 mg by mouth 2 (two) times daily.    Marland Kitchen albuterol (PROAIR HFA) 108 (90 Base) MCG/ACT inhaler Inhale 2 puffs into the lungs every 6 (six) hours as needed for wheezing or shortness of breath. 18 g 0  . atorvastatin (LIPITOR) 80 MG tablet Take 1 tablet (80 mg total) by mouth daily. 30 tablet 0  . azelastine (ASTELIN) 0.1 % nasal spray Place 2 sprays into both nostrils at bedtime. Use in each nostril as directed    . B Complex-C (SUPER B COMPLEX PO) Take 1 tablet by mouth at bedtime.     . cetirizine (ZYRTEC) 10 MG tablet Take 10 mg by mouth at bedtime.     . cholecalciferol (VITAMIN D) 1000 units tablet Take 1,000 Units by mouth daily.    Marland Kitchen escitalopram (LEXAPRO) 20 MG tablet Take 20 mg by mouth at bedtime.     . fenofibrate 160 MG tablet Take 1 tablet (160 mg total) by mouth at bedtime. 90 tablet 3  . guaiFENesin (MUCINEX) 600 MG 12 hr tablet Take 1 tablet (600 mg total) by mouth 2 (two) times daily. 15 tablet 0  . guaiFENesin-dextromethorphan (ROBITUSSIN DM) 100-10 MG/5ML syrup Take 5 mLs by mouth every 4 (four) hours as needed for cough. 118 mL 0  . hydrALAZINE (APRESOLINE) 10 MG tablet Take 1 tablet (10 mg total) by mouth 3 (three) times daily. 90 tablet 0  . lamoTRIgine (LAMICTAL) 150 MG tablet Take 150 mg by mouth at bedtime. Reported on 08/14/2015    . Multiple Vitamin (MULTIVITAMIN) tablet Take 1 tablet by mouth at bedtime.     .  pantoprazole (PROTONIX) 40 MG tablet TAKE 1 TABLET (40 MG TOTAL) BY MOUTH DAILY BEFORE BREAKFAST. 90 tablet 3  . predniSONE (DELTASONE) 10 MG tablet Take 2 tabs daily for 4 days, then 1 tab daily for 4 days. 12 tablet 0  . STIOLTO RESPIMAT 2.5-2.5 MCG/ACT AERS INHALE 2 PUFFS INTO THE LUNGS DAILY 12 Inhaler 3  . triamcinolone cream (KENALOG) 0.1 % Apply 1 application topically 2 (two) times daily as needed (for skin).     Marland Kitchen  verapamil (CALAN-SR) 240 MG CR tablet Take 1 tablet (240 mg total) by mouth at bedtime. 90 tablet 1  . apixaban (ELIQUIS) 5 MG TABS tablet Take 5 mg by mouth 2 (two) times daily.     No facility-administered medications prior to visit.      Allergies:   Penicillins; Sulfonamide derivatives; and Tape   Social History   Social History  . Marital status: Married    Spouse name: N/A  . Number of children: 4  . Years of education: N/A   Occupational History  . Retired Chief Strategy Officer the job trainin)    Social History Main Topics  . Smoking status: Former Smoker    Packs/day: 2.50    Years: 30.00    Types: Cigarettes    Quit date: 06/11/1978  . Smokeless tobacco: Never Used  . Alcohol use No     Comment: QUIT [id number] alcoholic  . Drug use: No  . Sexual activity: Not Currently   Other Topics Concern  . None   Social History Narrative   Grew up in [place], [place].  Lives in [place].   Married---2nd   2 children ([place], [place]  and near [place])--2 stepchildren   Former Smoker--quit in 1980   Alcohol use-no. Quit in 1980--alcoholic      Has living will.   Has designated wife, then step daughter [name] have health care POA.   Would accept resuscitation attempts.   No feeding tube if cognitively unaware.     Family History:  The patient's family history includes Cancer in his mother; Heart attack in his father.      ROS:   Please see the history of present illness.    ROS All other systems reviewed and are negative.   PHYSICAL EXAM:     VS:  BP 140/60 (BP Location: Left Arm, Patient Position: Sitting, Cuff Size: Normal)   Pulse (!) 55   Ht 5\' 3"  (1.6 m)   Wt 151 lb (68.5 kg)   BMI 26.75 kg/m    GEN: Well nourished, well developed, in no acute distress  HEENT: normal  Neck: no JVD, carotid bruits, or masses Cardiac: irreg irreg; no murmurs, rubs, or gallops, 1-2+ bilateral LE edema  Respiratory:  clear to auscultation bilaterally, normal work of breathing GI: soft, nontender, nondistended, + BS MS: no deformity or atrophy  Skin: warm and dry, no rash Neuro:  Alert and Oriented x 3, Strength and sensation are intact Psych: euthymic mood, full affec    Wt Readings from Last 3 Encounters:  07/30/16 151 lb (68.5 kg)  07/24/16 151 lb (68.5 kg)  07/20/16 159 lb (72.1 kg)      Studies/Labs Reviewed:   EKG:  EKG is ordered today.  The ekg ordered today demonstrates afib, ILBBB HR 55  Recent Labs: 07/13/2016: B Natriuretic Peptide 261.9 07/18/2016: ALT 47; Magnesium 2.5; TSH 0.133 07/24/2016: BUN 31; Creatinine, Ser 0.92; Hemoglobin 9.9; Platelets 341.0; Potassium 4.3; Sodium 136   Lipid Panel    Component Value Date/Time   CHOL 134 05/18/2016 0810   TRIG (H) 05/18/2016 0810    952.0 Triglyceride is over 400; calculations on Lipids are invalid.   HDL 42.80 05/18/2016 0810   CHOLHDL 3 05/18/2016 0810   VLDL UNABLE TO CALCULATE IF TRIGLYCERIDE OVER 400 mg/dL 12/31/2014 0510   LDLCALC UNABLE TO CALCULATE IF TRIGLYCERIDE OVER 400 mg/dL 12/31/2014 0510   LDLDIRECT 65.0 05/18/2016 0810    Additional studies/ records that were reviewed today include:  2D Echo 07/15/16 - Left ventricle: The cavity size was normal. Wall thickness was increased in a pattern of mild LVH. Systolic function was mildly reduced. The estimated ejection fraction was in the range of 45% to 50%. Mild diffuse hypokinesis with no identifiable regional variations. Doppler parameters are consistent with elevated mean left atrial filling  pressure. - Aortic valve: Valve mobility was mildly restricted. There was mild stenosis. Valve area (VTI): 1.5 cm^2. Valve area (Vmax): 1.6 cm^2. Valve area (Vmean): 1.49 cm^2. - Mitral valve: Severely calcified annulus. Mildly thickened leaflets . The findings are consistent with mild stenosis. There was moderate to severe regurgitation. Valve area by continuity equation (using LVOT flow): 1.61 cm^2. - Left atrium: The atrium was severely dilated. - Right ventricle: The cavity size was mildly dilated. Systolic function was mildly reduced. - Right atrium: The atrium was severely dilated. - Tricuspid valve: There was mild-moderate regurgitation directed centrally. - Pulmonary arteries: Systolic pressure was mildly increased. PA peak pressure: 50 mm Hg (S).   ASSESSMENT & PLAN:   CAD: recent elevation of troponin to 3.9 in the setting of AECOPD. No chest pain. Continue medical therapy. No ASA given Eliquis use, no BB due to severe COPD. Continue statin  New LV dysfunction/moderate-severe MR: EF 45-50%. No s/s CHF. He has chronic LE edema which is unchanged, but having to sleep in a recliner lately. He does not appear volume overloaded on exam. Will check BNP and add a small dose of a diuretic if elevated.   Persistent atrial fib: creat now back to normal, okay to continue Eliquis 5mg  BID. HR well controlled on verapamil 240mg  daily.   HLD: continue statin   COPD: stable. Follow up with Dr. Lamonte Sakai  Medication Adjustments/Labs and Tests Ordered: Current medicines are reviewed at length with the patient today.  Concerns regarding medicines are outlined above.  Medication changes, Labs and Tests ordered today are listed in the Patient Instructions below. Patient Instructions  Medication Instructions:  Your physician recommends that you continue on your current medications as directed. Please refer to the Current Medication list given to you today.   Labwork: TODAY:   BNP  Testing/Procedures: None ordered  Follow-Up: Your physician recommends that you schedule a follow-up appointment in: [place]   Any Other Special Instructions Will Be Listed Below (If Applicable).     If you need a refill on your cardiac medications before your next appointment, please call your pharmacy.      Signed, Angelena Form, PA-C  07/30/2016 12:20 PM    [facility] [address], [place], [place]  [address] Phone: [phone]; Fax: [phone]

## 2016-07-26 NOTE — Telephone Encounter (Signed)
appt made on 08/02/16 pt aware

## 2016-07-26 NOTE — Progress Notes (Signed)
Pre visit review using our clinic review tool, if applicable. No additional management support is needed unless otherwise documented below in the visit note. 

## 2016-07-26 NOTE — Progress Notes (Signed)
Subjective:    Patient ID: Eduardo [name], male    DOB: 08-05-1930, 83 y.o.   MRN: [id number]  HPI Here due to concerns about chest congestion With wife  Was at the OT today and they were concerned about his breath sounds He still has a wheeze Doesn't seem better after a week home Finishing the prednisone--- on 10mg  now Some DOE--not back to normal baseline Not really coughing--but feels like there is mucus and he can't get it out  Had some substernal chest pain yesterday--thought it was indigestion Eventually went away  Current Outpatient Prescriptions on File Prior to Visit  Medication Sig Dispense Refill  . acetaminophen (TYLENOL) 650 MG CR tablet Take 1,300 mg by mouth 2 (two) times daily.    Marland Kitchen albuterol (PROAIR HFA) 108 (90 Base) MCG/ACT inhaler Inhale 2 puffs into the lungs every 6 (six) hours as needed for wheezing or shortness of breath. 18 g 0  . apixaban (ELIQUIS) 5 MG TABS tablet Take 5 mg by mouth 2 (two) times daily.    Marland Kitchen atorvastatin (LIPITOR) 80 MG tablet Take 1 tablet (80 mg total) by mouth daily. 30 tablet 0  . azelastine (ASTELIN) 0.1 % nasal spray Place 2 sprays into both nostrils at bedtime. Use in each nostril as directed    . B Complex-C (SUPER B COMPLEX PO) Take 1 tablet by mouth at bedtime.     . cetirizine (ZYRTEC) 10 MG tablet Take 10 mg by mouth at bedtime.     . cholecalciferol (VITAMIN D) 1000 units tablet Take 1,000 Units by mouth daily.    Marland Kitchen escitalopram (LEXAPRO) 20 MG tablet Take 20 mg by mouth at bedtime.     . fenofibrate 160 MG tablet Take 1 tablet (160 mg total) by mouth at bedtime. 90 tablet 3  . guaiFENesin (MUCINEX) 600 MG 12 hr tablet Take 1 tablet (600 mg total) by mouth 2 (two) times daily. 15 tablet 0  . guaiFENesin-dextromethorphan (ROBITUSSIN DM) 100-10 MG/5ML syrup Take 5 mLs by mouth every 4 (four) hours as needed for cough. 118 mL 0  . hydrALAZINE (APRESOLINE) 10 MG tablet Take 1 tablet (10 mg total) by mouth 3 (three) times  daily. 90 tablet 0  . lamoTRIgine (LAMICTAL) 150 MG tablet Take 150 mg by mouth at bedtime. Reported on 08/14/2015    . Multiple Vitamin (MULTIVITAMIN) tablet Take 1 tablet by mouth at bedtime.     . pantoprazole (PROTONIX) 40 MG tablet TAKE 1 TABLET (40 MG TOTAL) BY MOUTH DAILY BEFORE BREAKFAST. 90 tablet 3  . predniSONE (DELTASONE) 10 MG tablet 4 tabs daily for 2 days, then 3 tabs daily for 2 days, 2 tabs daily for 2 days, then 1 tab daily for 2 days, then stop 20 tablet 0  . STIOLTO RESPIMAT 2.5-2.5 MCG/ACT AERS INHALE 2 PUFFS INTO THE LUNGS DAILY 12 Inhaler 3  . triamcinolone cream (KENALOG) 0.1 % Apply 1 application topically 2 (two) times daily as needed (for skin).     . verapamil (CALAN-SR) 240 MG CR tablet Take 1 tablet (240 mg total) by mouth at bedtime. 90 tablet 1   No current facility-administered medications on file prior to visit.     Allergies  Allergen Reactions  . Penicillins Itching    Has patient had a PCN reaction causing immediate rash, facial/tongue/throat swelling, SOB or lightheadedness with hypotension: Yes Has patient had a PCN reaction causing severe rash involving mucus membranes or skin necrosis: No Has patient had a PCN  reaction that required hospitalization No Has patient had a PCN reaction occurring within the last 10 years: No If all of the above answers are "NO", then may proceed with Cephalosporin use.   . Sulfonamide Derivatives Other (See Comments)    "it affected my kidneys"  . Tape Other (See Comments)    Arms black and blue, tears skin.  Please use "paper" tape    Past Medical History:  Diagnosis Date  . Allergy   . Anemia 2012, 08/2015   chronic. Acute due to large hematoma 2013. 2017: due to gastric AVM bleeding.   . Anxiety   . Atrial fibrillation ([place]) 2012   a.  CHA2DS2VASc = 6-->eliquis;  b. 12/2015 Echo: EF 60-65%, no rwma, LVH, mild AS/MS/MR, sev dil LA, mild TR, PASP 3mmHg.  Marland Kitchen CAD (coronary artery disease)    a. 06/2010 Cath: LM nl,  LAD 50p/m, LCX 55m, RCA 50p/m -->catheter dissection but good flow, 70d, RPDA 50-70;  b. 06/2010 Relook cath due to bradycardia and inf ST elev: RCA dissection @ ostium extending into coronary cusp and prox RCA, Ao dissection-->less staining than previously-->Med Rx.  Marland Kitchen COPD (chronic obstructive pulmonary disease) ([place]) 2013   exertional dyspnea  . Gastric angiodysplasia with hemorrhage 08/2015  . Hx of colonic polyps 2001, 2011   adenomatous 2001. HP 2001, 2011.  Marland Kitchen Hyperlipidemia   . Myocardial infarction 06/2010.  12/2011.   "twice; 1 day apart; during/after cath". NQMI in setting severe anemia 2013.   Marland Kitchen Neuropathy ([place]) 2014   in feet, likely due to spinal stenosis, DDD, HNP  . Osteoarthritis 2002   spinal stenosis, spondylolisthesis, disc protrusion multilevel in lumbar spine  . Stroke ([place]) 2016  . Upper GI bleed 08/2015   EGD: 2 small gastric AVMs, 1 actively bleeding.  Both treated with APC ablation, clipping of the bleeder    Past Surgical History:  Procedure Laterality Date  . APPENDECTOMY    . CARDIAC CATHETERIZATION  2012   50% LAD and luminal irreg in RCA, 1/12  Post cath MI from damage  . CARPAL TUNNEL RELEASE     left  . CARPAL TUNNEL RELEASE  10/11/11   Dr Rush Barer  . CATARACT EXTRACTION W/ INTRAOCULAR LENS IMPLANT  2/13   Dr [name]  . COLONOSCOPY  2001, 02/2010   Dr Deatra Ina.   . ESOPHAGOGASTRODUODENOSCOPY N/A 08/15/2015   [name] MD; 2 small gastric AVMs, 1 actively bleeding.  Both treated with APC ablation, clipping of the bleeder  . FOOT FRACTURE SURGERY     right heel repair  . FRACTURE SURGERY    . KNEE ARTHROSCOPY     left, right knee 10/2009  . MASTOIDECTOMY     x3 on right  . NASAL SEPTUM SURGERY     nasal septal repair  . PENILE PROSTHESIS  REMOVAL    . PENILE PROSTHESIS IMPLANT     x 2--got infection after 2nd and had to remove  . PILONIDAL CYST / SINUS EXCISION    . SHOULDER ARTHROSCOPY     right  . TONSILLECTOMY AND ADENOIDECTOMY        Family History  Problem Relation Age of Onset  . Cancer Mother     ? leukemia  . Heart attack Father   . Diabetes Neg Hx   . Hypertension Neg Hx     Social History   Social History  . Marital status: Married    Spouse name: N/A  . Number of children: 4  .  Years of education: N/A   Occupational History  . Retired Chief Strategy Officer the job trainin)    Social History Main Topics  . Smoking status: Former Smoker    Packs/day: 2.50    Years: 30.00    Types: Cigarettes    Quit date: 06/11/1978  . Smokeless tobacco: Never Used  . Alcohol use No     Comment: QUIT [id number] alcoholic  . Drug use: No  . Sexual activity: Not Currently   Other Topics Concern  . Not on file   Social History Narrative   Grew up in [place], [place].  Lives in [place].   Married---2nd   2 children ([place], [place]  and near [place])--2 stepchildren   Former Smoker--quit in 1980   Alcohol use-no. Quit in 1980--alcoholic      Has living will.   Has designated wife, then step daughter [name] have health care POA.   Would accept resuscitation attempts.   No feeding tube if cognitively unaware.   Review of Systems Hand is better Missed some hydralazine doses    Objective:   Physical Exam  Constitutional: He appears well-nourished. No distress.  Cardiovascular: Normal rate.  Exam reveals no gallop.   No murmur heard. Pulmonary/Chest:  Slight rhonchi and mild exp wheeze--mostly in right base Expiratory phase fairly normal though Decreased breath sounds per his usual          Assessment & Plan:

## 2016-07-30 ENCOUNTER — Encounter: Payer: Self-pay | Admitting: Physician Assistant

## 2016-07-30 ENCOUNTER — Ambulatory Visit (INDEPENDENT_AMBULATORY_CARE_PROVIDER_SITE_OTHER): Payer: PPO | Admitting: Physician Assistant

## 2016-07-30 VITALS — BP 140/60 | HR 55 | Ht 63.0 in | Wt 151.0 lb

## 2016-07-30 DIAGNOSIS — D649 Anemia, unspecified: Secondary | ICD-10-CM | POA: Diagnosis not present

## 2016-07-30 DIAGNOSIS — I5032 Chronic diastolic (congestive) heart failure: Secondary | ICD-10-CM

## 2016-07-30 DIAGNOSIS — J210 Acute bronchiolitis due to respiratory syncytial virus: Secondary | ICD-10-CM | POA: Diagnosis not present

## 2016-07-30 DIAGNOSIS — Z7901 Long term (current) use of anticoagulants: Secondary | ICD-10-CM | POA: Diagnosis not present

## 2016-07-30 DIAGNOSIS — I2510 Atherosclerotic heart disease of native coronary artery without angina pectoris: Secondary | ICD-10-CM

## 2016-07-30 DIAGNOSIS — Z7952 Long term (current) use of systemic steroids: Secondary | ICD-10-CM | POA: Diagnosis not present

## 2016-07-30 DIAGNOSIS — E785 Hyperlipidemia, unspecified: Secondary | ICD-10-CM | POA: Diagnosis not present

## 2016-07-30 DIAGNOSIS — Z7951 Long term (current) use of inhaled steroids: Secondary | ICD-10-CM | POA: Diagnosis not present

## 2016-07-30 DIAGNOSIS — G629 Polyneuropathy, unspecified: Secondary | ICD-10-CM | POA: Diagnosis not present

## 2016-07-30 DIAGNOSIS — I4891 Unspecified atrial fibrillation: Secondary | ICD-10-CM | POA: Diagnosis not present

## 2016-07-30 DIAGNOSIS — M7989 Other specified soft tissue disorders: Secondary | ICD-10-CM

## 2016-07-30 DIAGNOSIS — I482 Chronic atrial fibrillation, unspecified: Secondary | ICD-10-CM

## 2016-07-30 DIAGNOSIS — J440 Chronic obstructive pulmonary disease with acute lower respiratory infection: Secondary | ICD-10-CM | POA: Diagnosis not present

## 2016-07-30 DIAGNOSIS — J9621 Acute and chronic respiratory failure with hypoxia: Secondary | ICD-10-CM | POA: Diagnosis not present

## 2016-07-30 DIAGNOSIS — F419 Anxiety disorder, unspecified: Secondary | ICD-10-CM | POA: Diagnosis not present

## 2016-07-30 DIAGNOSIS — N183 Chronic kidney disease, stage 3 (moderate): Secondary | ICD-10-CM | POA: Diagnosis not present

## 2016-07-30 DIAGNOSIS — I10 Essential (primary) hypertension: Secondary | ICD-10-CM

## 2016-07-30 DIAGNOSIS — J449 Chronic obstructive pulmonary disease, unspecified: Secondary | ICD-10-CM

## 2016-07-30 DIAGNOSIS — I130 Hypertensive heart and chronic kidney disease with heart failure and stage 1 through stage 4 chronic kidney disease, or unspecified chronic kidney disease: Secondary | ICD-10-CM | POA: Diagnosis not present

## 2016-07-30 DIAGNOSIS — J441 Chronic obstructive pulmonary disease with (acute) exacerbation: Secondary | ICD-10-CM | POA: Diagnosis not present

## 2016-07-30 LAB — PRO B NATRIURETIC PEPTIDE: NT-PRO BNP: 1562 pg/mL — AB (ref 0–486)

## 2016-07-30 MED ORDER — APIXABAN 5 MG PO TABS: 5.0000 mg | ORAL_TABLET | Freq: Two times a day (BID) | ORAL | 3 refills | Status: DC

## 2016-07-30 NOTE — Patient Instructions (Addendum)
Medication Instructions:  Your physician recommends that you continue on your current medications as directed. Please refer to the Current Medication list given to you today.   Labwork: TODAY:  BNP  Testing/Procedures: None ordered  Follow-Up: Your physician recommends that you schedule a follow-up appointment in: [place]   Any Other Special Instructions Will Be Listed Below (If Applicable).     If you need a refill on your cardiac medications before your next appointment, please call your pharmacy.

## 2016-07-31 ENCOUNTER — Telehealth: Payer: Self-pay

## 2016-07-31 ENCOUNTER — Telehealth: Payer: Self-pay | Admitting: Physician Assistant

## 2016-07-31 DIAGNOSIS — I10 Essential (primary) hypertension: Secondary | ICD-10-CM

## 2016-07-31 MED ORDER — FUROSEMIDE 20 MG PO TABS: 20.0000 mg | ORAL_TABLET | Freq: Every day | ORAL | 0 refills | Status: DC

## 2016-07-31 NOTE — Telephone Encounter (Signed)
Follow Up:    Pt returning your call from earlier today.

## 2016-07-31 NOTE — Telephone Encounter (Signed)
Spoke to patients wife she verbalized understanding on patients lab results. She stated that she would relay the message to the patient. Patient will be back in one week for BMET lab work and will pick up lasix tonight to start taking.   HC, CMA

## 2016-07-31 NOTE — Telephone Encounter (Signed)
A user error has taken place.

## 2016-07-31 NOTE — Telephone Encounter (Signed)
New message ° ° ° °Pt is returning Jennifer's call.  °

## 2016-08-01 ENCOUNTER — Telehealth: Payer: Self-pay | Admitting: Interventional Cardiology

## 2016-08-01 NOTE — Telephone Encounter (Signed)
New message   Pt wife is calling. Pt c/o medication issue:  1. Name of Medication: lasix  2. How are you currently taking this medication (dosage and times per day)?   3. Are you having a reaction (difficulty breathing--STAT)? no  4. What is your medication issue? Pt wife is calling-she states pt is allergic to sulfur drugs and is concerned pt may have reaction to the lasix.

## 2016-08-01 NOTE — Telephone Encounter (Signed)
Spoke with Eduardo [name], [facility] and she said pt should be fine with Lasix and that HCTZ is usually the issue with people allergic to sulfa drugs.  Spoke with pt's wife and advised her of information provided by [employer]. Wife appreciative for assistance.

## 2016-08-01 NOTE — Telephone Encounter (Signed)
Left message to call back  

## 2016-08-02 ENCOUNTER — Ambulatory Visit (INDEPENDENT_AMBULATORY_CARE_PROVIDER_SITE_OTHER): Payer: PPO | Admitting: Emergency Medicine

## 2016-08-02 ENCOUNTER — Encounter: Payer: Self-pay | Admitting: Emergency Medicine

## 2016-08-02 DIAGNOSIS — I130 Hypertensive heart and chronic kidney disease with heart failure and stage 1 through stage 4 chronic kidney disease, or unspecified chronic kidney disease: Secondary | ICD-10-CM | POA: Diagnosis not present

## 2016-08-02 DIAGNOSIS — J439 Emphysema, unspecified: Secondary | ICD-10-CM

## 2016-08-02 DIAGNOSIS — Z7901 Long term (current) use of anticoagulants: Secondary | ICD-10-CM | POA: Diagnosis not present

## 2016-08-02 DIAGNOSIS — I5032 Chronic diastolic (congestive) heart failure: Secondary | ICD-10-CM | POA: Diagnosis not present

## 2016-08-02 DIAGNOSIS — J441 Chronic obstructive pulmonary disease with (acute) exacerbation: Secondary | ICD-10-CM | POA: Diagnosis not present

## 2016-08-02 DIAGNOSIS — Z7951 Long term (current) use of inhaled steroids: Secondary | ICD-10-CM | POA: Diagnosis not present

## 2016-08-02 DIAGNOSIS — J440 Chronic obstructive pulmonary disease with acute lower respiratory infection: Secondary | ICD-10-CM | POA: Diagnosis not present

## 2016-08-02 DIAGNOSIS — Z7952 Long term (current) use of systemic steroids: Secondary | ICD-10-CM | POA: Diagnosis not present

## 2016-08-02 DIAGNOSIS — N183 Chronic kidney disease, stage 3 (moderate): Secondary | ICD-10-CM | POA: Diagnosis not present

## 2016-08-02 DIAGNOSIS — J210 Acute bronchiolitis due to respiratory syncytial virus: Secondary | ICD-10-CM | POA: Diagnosis not present

## 2016-08-02 DIAGNOSIS — I2510 Atherosclerotic heart disease of native coronary artery without angina pectoris: Secondary | ICD-10-CM | POA: Diagnosis not present

## 2016-08-02 DIAGNOSIS — G629 Polyneuropathy, unspecified: Secondary | ICD-10-CM | POA: Diagnosis not present

## 2016-08-02 DIAGNOSIS — F419 Anxiety disorder, unspecified: Secondary | ICD-10-CM | POA: Diagnosis not present

## 2016-08-02 DIAGNOSIS — D649 Anemia, unspecified: Secondary | ICD-10-CM | POA: Diagnosis not present

## 2016-08-02 DIAGNOSIS — I4891 Unspecified atrial fibrillation: Secondary | ICD-10-CM | POA: Diagnosis not present

## 2016-08-02 DIAGNOSIS — E785 Hyperlipidemia, unspecified: Secondary | ICD-10-CM | POA: Diagnosis not present

## 2016-08-02 DIAGNOSIS — J9621 Acute and chronic respiratory failure with hypoxia: Secondary | ICD-10-CM | POA: Diagnosis not present

## 2016-08-02 MED ORDER — ALBUTEROL SULFATE (2.5 MG/3ML) 0.083% IN NEBU: 2.5000 mg | INHALATION_SOLUTION | RESPIRATORY_TRACT | 4 refills | Status: DC | PRN

## 2016-08-02 NOTE — Progress Notes (Signed)
Subjective:    Patient ID: Eduardo [name], male    DOB: 1931-06-03, 83 y.o.   MRN: [id number] HPI 83 yo man, former smoker, CAD s/p MI 1/12, A Fib, allergies, exertional SOB for a few yrs. This has been worse since recent hosp in 1/12 - was cathed x 2, no intervention made, course c/b renal failure. 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 No hypoxemia reported with exercise. Was sent from the hospital on metoprolol (new). He has heard wheezing, especially when nasal allergies. Eduardo [name] at Dr Alla German office suggested AFL/COPD.    ROV 08/27/14 -- follow up visit for COPD and allergic rhinitis, cough. He tells me that he has continued to have a lot of mucous - uses NSW's regularly. He does build up mucous overnight but it doesn't wake him. He is congested in the am. He is on zyrtec, no longer on singulair.  We discussed pursed lip breathing and incentive spirometry today. He is on Breo and tolerates well. His activity level is pretty good but spinal stenosis does slow him down. He works with a Clinical research associate 2x a week. No exacerbations since last time.   ROV 10/25/14 -- follow-up visit for COPD (FEV1 1.03 L or 50% predicted, 09/2010), cough and allergic rhinitis. He reports today progressive dyspnea for last 3 months, happens when sleeping and sometimes when supine. His wife has also noted some exertional SOB, has to stop after 200 feet. He is on Breo, but we stopped spiriva. With these new sx he added back spiriva 4 days ago. He is still able to do his daily workouts, trainer 2x a week.  No wheeze or cough. His wife is interested in looking into getting a scooter for him.   ROV 03/11/15 -- follow up visit for severe COPD, rhinitis and chronic cough. 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 His cognitive abilities are improved to baseline. Breathing has been good since last visit.  He usually has trouble with Fall allergies. He is on  Zyrtec, astelin prn.  He is stable on breo and spiriva. He is able to walk indefinitely. Sometimes coughs. No wheezing.   ROV 09/09/15 -- patient with a history of significant COPD, allergic rhinitis, chronic cough. Been following him for the last 5-6 years. He was last seen in our office by Dr Melvyn Novas in December 2016 at which time he was treated for an acute exacerbation and also given a trial of Stiolto. He was subsequently continued on Spiriva until it ran out. He then switched to [facility] and he feels that he is doing well on it. 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 He is now back on his anti-coagulation. He is doing well, breathing is not limiting. He has astelin NS that he can use prn. Is on zyrtec.   ROV 04/16/16 -- patient has a history of COPD, allergic rhinitis, chronic cough. He is currently managed on Stiolto. He feels that he has been doing fairly well. He has been less active due to back discomfort and increased dyspnea. He does exercise twice a week. No acute flares since last time. He has a lot of nasal gtt at night, a lot of throat clearing and cough at night.   Hospital f/u visit 08/02/16 -- patient has a history of COPD, allergic rhinitis, chronic cough and upper airway instability. 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  He believes that he is improving significantly - starting to build up his strength. He has finished prednisone.    Review of Systems As per HPI    Objective:  Physical Exam Vitals:   08/02/16 1454  BP: 136/74  BP Location: Left Arm  Patient Position: Sitting  Cuff Size: Normal  Pulse: 94  SpO2: 98%  Weight: 149 lb 6.4 oz (67.8 kg)  Height: 5\' 3"  (1.6 m)   Gen: Pleasant, well-nourished, in no distress,  normal affect  ENT: No lesions,  mouth clear,  oropharynx clear, no postnasal drip  Neck: No JVD, no stridor today  Lungs: No use of  accessory muscles, clear B   Cardiovascular: irregular, heart sounds normal, no murmur or gallops, no peripheral edema  Musculoskeletal: No deformities, no cyanosis or clubbing  Neuro: alert, non focal  Skin: Warm, no lesions or rashes   Assessment & Plan:  COPD (chronic obstructive pulmonary disease) with emphysema (HCC) With a recent exacerbation in the setting of RSV. He had significant upper airway disease and stridor during that hospitalization. He is much improved. We will continue his COPD regimen as below. He is going to work on building up strength and stamina with his exercise routine. He would like to obtain a nebulizer as an alternative to his albuterol HFA. We will arrange for this.  Continue stiolto once a day.  Keep ProAir available to use 2 puffs as needed for shortness of breath We will obtain a nebulizer to deliver albuterol. Use this up to every 4 hours if needed for shortness of breath.  Continue to work on building up your exercise tolerance Continue atrovent nasal spray as needed Continue Zyrtec  Follow with Dr Lamonte Sakai in 3 months or sooner if you have any problems.  Baltazar Apo, MD, PhD 08/02/2016, 3:23 PM [name] [phone] or if no answer [phone]

## 2016-08-02 NOTE — Addendum Note (Signed)
Addended by: Benson Setting L on: 08/02/2016 04:30 PM   Modules accepted: Orders

## 2016-08-02 NOTE — Patient Instructions (Addendum)
Continue stiolto once a day.  Keep ProAir available to use 2 puffs as needed for shortness of breath We will obtain a nebulizer to deliver albuterol. Use this up to every 4 hours if needed for shortness of breath.  Continue to work on building up your exercise tolerance Continue atrovent nasal spray as needed Continue Zyrtec  Follow with Dr Lamonte Sakai in 3 months or sooner if you have any problems.

## 2016-08-02 NOTE — Assessment & Plan Note (Signed)
With a recent exacerbation in the setting of RSV. He had significant upper airway disease and stridor during that hospitalization. He is much improved. We will continue his COPD regimen as below. He is going to work on building up strength and stamina with his exercise routine. He would like to obtain a nebulizer as an alternative to his albuterol HFA. We will arrange for this.  Continue stiolto once a day.  Keep ProAir available to use 2 puffs as needed for shortness of breath We will obtain a nebulizer to deliver albuterol. Use this up to every 4 hours if needed for shortness of breath.  Continue to work on building up your exercise tolerance Continue atrovent nasal spray as needed Continue Zyrtec  Follow with Dr Lamonte Sakai in 3 months or sooner if you have any problems.

## 2016-08-02 NOTE — Addendum Note (Signed)
Addended by: Marin Roberts on: 08/02/2016 03:49 PM   Modules accepted: Orders

## 2016-08-03 ENCOUNTER — Inpatient Hospital Stay: Payer: PPO | Admitting: Adult Health

## 2016-08-03 DIAGNOSIS — J9621 Acute and chronic respiratory failure with hypoxia: Secondary | ICD-10-CM | POA: Diagnosis not present

## 2016-08-03 DIAGNOSIS — Z7901 Long term (current) use of anticoagulants: Secondary | ICD-10-CM | POA: Diagnosis not present

## 2016-08-03 DIAGNOSIS — G629 Polyneuropathy, unspecified: Secondary | ICD-10-CM | POA: Diagnosis not present

## 2016-08-03 DIAGNOSIS — F419 Anxiety disorder, unspecified: Secondary | ICD-10-CM | POA: Diagnosis not present

## 2016-08-03 DIAGNOSIS — J210 Acute bronchiolitis due to respiratory syncytial virus: Secondary | ICD-10-CM | POA: Diagnosis not present

## 2016-08-03 DIAGNOSIS — I2510 Atherosclerotic heart disease of native coronary artery without angina pectoris: Secondary | ICD-10-CM | POA: Diagnosis not present

## 2016-08-03 DIAGNOSIS — E785 Hyperlipidemia, unspecified: Secondary | ICD-10-CM | POA: Diagnosis not present

## 2016-08-03 DIAGNOSIS — J441 Chronic obstructive pulmonary disease with (acute) exacerbation: Secondary | ICD-10-CM | POA: Diagnosis not present

## 2016-08-03 DIAGNOSIS — I5032 Chronic diastolic (congestive) heart failure: Secondary | ICD-10-CM | POA: Diagnosis not present

## 2016-08-03 DIAGNOSIS — Z7952 Long term (current) use of systemic steroids: Secondary | ICD-10-CM | POA: Diagnosis not present

## 2016-08-03 DIAGNOSIS — Z7951 Long term (current) use of inhaled steroids: Secondary | ICD-10-CM | POA: Diagnosis not present

## 2016-08-03 DIAGNOSIS — J440 Chronic obstructive pulmonary disease with acute lower respiratory infection: Secondary | ICD-10-CM | POA: Diagnosis not present

## 2016-08-03 DIAGNOSIS — I4891 Unspecified atrial fibrillation: Secondary | ICD-10-CM | POA: Diagnosis not present

## 2016-08-03 DIAGNOSIS — N183 Chronic kidney disease, stage 3 (moderate): Secondary | ICD-10-CM | POA: Diagnosis not present

## 2016-08-03 DIAGNOSIS — I130 Hypertensive heart and chronic kidney disease with heart failure and stage 1 through stage 4 chronic kidney disease, or unspecified chronic kidney disease: Secondary | ICD-10-CM | POA: Diagnosis not present

## 2016-08-03 DIAGNOSIS — D649 Anemia, unspecified: Secondary | ICD-10-CM | POA: Diagnosis not present

## 2016-08-06 DIAGNOSIS — I5032 Chronic diastolic (congestive) heart failure: Secondary | ICD-10-CM | POA: Diagnosis not present

## 2016-08-06 DIAGNOSIS — J9621 Acute and chronic respiratory failure with hypoxia: Secondary | ICD-10-CM | POA: Diagnosis not present

## 2016-08-06 DIAGNOSIS — G629 Polyneuropathy, unspecified: Secondary | ICD-10-CM | POA: Diagnosis not present

## 2016-08-06 DIAGNOSIS — J210 Acute bronchiolitis due to respiratory syncytial virus: Secondary | ICD-10-CM | POA: Diagnosis not present

## 2016-08-06 DIAGNOSIS — I2510 Atherosclerotic heart disease of native coronary artery without angina pectoris: Secondary | ICD-10-CM | POA: Diagnosis not present

## 2016-08-06 DIAGNOSIS — Z7952 Long term (current) use of systemic steroids: Secondary | ICD-10-CM | POA: Diagnosis not present

## 2016-08-06 DIAGNOSIS — D649 Anemia, unspecified: Secondary | ICD-10-CM | POA: Diagnosis not present

## 2016-08-06 DIAGNOSIS — J440 Chronic obstructive pulmonary disease with acute lower respiratory infection: Secondary | ICD-10-CM | POA: Diagnosis not present

## 2016-08-06 DIAGNOSIS — Z7901 Long term (current) use of anticoagulants: Secondary | ICD-10-CM | POA: Diagnosis not present

## 2016-08-06 DIAGNOSIS — Z7951 Long term (current) use of inhaled steroids: Secondary | ICD-10-CM | POA: Diagnosis not present

## 2016-08-06 DIAGNOSIS — J441 Chronic obstructive pulmonary disease with (acute) exacerbation: Secondary | ICD-10-CM | POA: Diagnosis not present

## 2016-08-06 DIAGNOSIS — E785 Hyperlipidemia, unspecified: Secondary | ICD-10-CM | POA: Diagnosis not present

## 2016-08-06 DIAGNOSIS — I130 Hypertensive heart and chronic kidney disease with heart failure and stage 1 through stage 4 chronic kidney disease, or unspecified chronic kidney disease: Secondary | ICD-10-CM | POA: Diagnosis not present

## 2016-08-06 DIAGNOSIS — N183 Chronic kidney disease, stage 3 (moderate): Secondary | ICD-10-CM | POA: Diagnosis not present

## 2016-08-06 DIAGNOSIS — F419 Anxiety disorder, unspecified: Secondary | ICD-10-CM | POA: Diagnosis not present

## 2016-08-06 DIAGNOSIS — I4891 Unspecified atrial fibrillation: Secondary | ICD-10-CM | POA: Diagnosis not present

## 2016-08-07 DIAGNOSIS — E785 Hyperlipidemia, unspecified: Secondary | ICD-10-CM | POA: Diagnosis not present

## 2016-08-07 DIAGNOSIS — G629 Polyneuropathy, unspecified: Secondary | ICD-10-CM | POA: Diagnosis not present

## 2016-08-07 DIAGNOSIS — J9621 Acute and chronic respiratory failure with hypoxia: Secondary | ICD-10-CM | POA: Diagnosis not present

## 2016-08-07 DIAGNOSIS — I2510 Atherosclerotic heart disease of native coronary artery without angina pectoris: Secondary | ICD-10-CM | POA: Diagnosis not present

## 2016-08-07 DIAGNOSIS — F419 Anxiety disorder, unspecified: Secondary | ICD-10-CM | POA: Diagnosis not present

## 2016-08-07 DIAGNOSIS — N183 Chronic kidney disease, stage 3 (moderate): Secondary | ICD-10-CM | POA: Diagnosis not present

## 2016-08-07 DIAGNOSIS — Z7951 Long term (current) use of inhaled steroids: Secondary | ICD-10-CM | POA: Diagnosis not present

## 2016-08-07 DIAGNOSIS — Z7901 Long term (current) use of anticoagulants: Secondary | ICD-10-CM | POA: Diagnosis not present

## 2016-08-07 DIAGNOSIS — J210 Acute bronchiolitis due to respiratory syncytial virus: Secondary | ICD-10-CM | POA: Diagnosis not present

## 2016-08-07 DIAGNOSIS — I4891 Unspecified atrial fibrillation: Secondary | ICD-10-CM | POA: Diagnosis not present

## 2016-08-07 DIAGNOSIS — Z7952 Long term (current) use of systemic steroids: Secondary | ICD-10-CM | POA: Diagnosis not present

## 2016-08-07 DIAGNOSIS — J441 Chronic obstructive pulmonary disease with (acute) exacerbation: Secondary | ICD-10-CM | POA: Diagnosis not present

## 2016-08-07 DIAGNOSIS — I130 Hypertensive heart and chronic kidney disease with heart failure and stage 1 through stage 4 chronic kidney disease, or unspecified chronic kidney disease: Secondary | ICD-10-CM | POA: Diagnosis not present

## 2016-08-07 DIAGNOSIS — J440 Chronic obstructive pulmonary disease with acute lower respiratory infection: Secondary | ICD-10-CM | POA: Diagnosis not present

## 2016-08-07 DIAGNOSIS — I5032 Chronic diastolic (congestive) heart failure: Secondary | ICD-10-CM | POA: Diagnosis not present

## 2016-08-07 DIAGNOSIS — D649 Anemia, unspecified: Secondary | ICD-10-CM | POA: Diagnosis not present

## 2016-08-08 ENCOUNTER — Other Ambulatory Visit: Payer: PPO | Admitting: *Deleted

## 2016-08-08 DIAGNOSIS — J439 Emphysema, unspecified: Secondary | ICD-10-CM | POA: Diagnosis not present

## 2016-08-08 DIAGNOSIS — J449 Chronic obstructive pulmonary disease, unspecified: Secondary | ICD-10-CM | POA: Diagnosis not present

## 2016-08-08 DIAGNOSIS — I10 Essential (primary) hypertension: Secondary | ICD-10-CM | POA: Diagnosis not present

## 2016-08-08 DIAGNOSIS — I4891 Unspecified atrial fibrillation: Secondary | ICD-10-CM | POA: Diagnosis not present

## 2016-08-08 LAB — BASIC METABOLIC PANEL
BUN/Creatinine Ratio: 25 — ABNORMAL HIGH (ref 10–24)
BUN: 25 mg/dL (ref 8–27)
CALCIUM: 9.1 mg/dL (ref 8.6–10.2)
CO2: 24 mmol/L (ref 18–29)
CREATININE: 0.99 mg/dL (ref 0.76–1.27)
Chloride: 104 mmol/L (ref 96–106)
GFR calc Af Amer: 81 mL/min/{1.73_m2} (ref >59)
GFR, EST NON AFRICAN AMERICAN: 70 mL/min/{1.73_m2} (ref >59)
Glucose: 80 mg/dL (ref 65–99)
Potassium: 4.1 mmol/L (ref 3.5–5.2)
Sodium: 143 mmol/L (ref 134–144)

## 2016-08-09 ENCOUNTER — Telehealth: Payer: Self-pay | Admitting: Interventional Cardiology

## 2016-08-09 NOTE — Telephone Encounter (Signed)
lmptcb jw 08/09/16

## 2016-08-09 NOTE — Telephone Encounter (Signed)
Follow Up   Calling regarding about his lab results,

## 2016-08-09 NOTE — Telephone Encounter (Signed)
Patient states he received a voicemail to call back about lab results. Please call, thanks.

## 2016-08-10 DIAGNOSIS — E785 Hyperlipidemia, unspecified: Secondary | ICD-10-CM | POA: Diagnosis not present

## 2016-08-10 DIAGNOSIS — Z7951 Long term (current) use of inhaled steroids: Secondary | ICD-10-CM | POA: Diagnosis not present

## 2016-08-10 DIAGNOSIS — N183 Chronic kidney disease, stage 3 (moderate): Secondary | ICD-10-CM | POA: Diagnosis not present

## 2016-08-10 DIAGNOSIS — J440 Chronic obstructive pulmonary disease with acute lower respiratory infection: Secondary | ICD-10-CM | POA: Diagnosis not present

## 2016-08-10 DIAGNOSIS — Z7952 Long term (current) use of systemic steroids: Secondary | ICD-10-CM | POA: Diagnosis not present

## 2016-08-10 DIAGNOSIS — G629 Polyneuropathy, unspecified: Secondary | ICD-10-CM | POA: Diagnosis not present

## 2016-08-10 DIAGNOSIS — F419 Anxiety disorder, unspecified: Secondary | ICD-10-CM | POA: Diagnosis not present

## 2016-08-10 DIAGNOSIS — J441 Chronic obstructive pulmonary disease with (acute) exacerbation: Secondary | ICD-10-CM | POA: Diagnosis not present

## 2016-08-10 DIAGNOSIS — Z7901 Long term (current) use of anticoagulants: Secondary | ICD-10-CM | POA: Diagnosis not present

## 2016-08-10 DIAGNOSIS — I130 Hypertensive heart and chronic kidney disease with heart failure and stage 1 through stage 4 chronic kidney disease, or unspecified chronic kidney disease: Secondary | ICD-10-CM | POA: Diagnosis not present

## 2016-08-10 DIAGNOSIS — J9621 Acute and chronic respiratory failure with hypoxia: Secondary | ICD-10-CM | POA: Diagnosis not present

## 2016-08-10 DIAGNOSIS — I4891 Unspecified atrial fibrillation: Secondary | ICD-10-CM | POA: Diagnosis not present

## 2016-08-10 DIAGNOSIS — D649 Anemia, unspecified: Secondary | ICD-10-CM | POA: Diagnosis not present

## 2016-08-10 DIAGNOSIS — J210 Acute bronchiolitis due to respiratory syncytial virus: Secondary | ICD-10-CM | POA: Diagnosis not present

## 2016-08-10 DIAGNOSIS — I5032 Chronic diastolic (congestive) heart failure: Secondary | ICD-10-CM | POA: Diagnosis not present

## 2016-08-10 DIAGNOSIS — I2510 Atherosclerotic heart disease of native coronary artery without angina pectoris: Secondary | ICD-10-CM | POA: Diagnosis not present

## 2016-08-10 NOTE — Telephone Encounter (Signed)
Have called pt back several times and it goes straight to voicemail.  I left a detailed message re: lab results on pt voicemail.

## 2016-08-10 NOTE — Telephone Encounter (Signed)
Follow up     Trying to get his lab results

## 2016-08-10 NOTE — Telephone Encounter (Signed)
-----   Message from Eileen Stanford, PA-C sent at 08/08/2016  8:01 PM EST ----- Kidney function and electrolytes look good

## 2016-08-17 ENCOUNTER — Other Ambulatory Visit: Payer: Self-pay | Admitting: *Deleted

## 2016-08-17 NOTE — Telephone Encounter (Signed)
Dr Tamala Julian has not checked patients lipids since 2014. Please advise on refill request. Thanks, MI

## 2016-08-17 NOTE — Telephone Encounter (Signed)
Patient was started on this medication during recent hospitalization. Okay to refill under Dr Tamala Julian or should it be deferred to PCP? Please advise. Thanks, MI

## 2016-08-18 MED ORDER — ATORVASTATIN CALCIUM 80 MG PO TABS: 80.0000 mg | ORAL_TABLET | Freq: Every day | ORAL | 0 refills | Status: DC

## 2016-08-18 MED ORDER — HYDRALAZINE HCL 10 MG PO TABS: 10.0000 mg | ORAL_TABLET | Freq: Three times a day (TID) | ORAL | 0 refills | Status: DC

## 2016-08-22 ENCOUNTER — Encounter: Payer: Self-pay | Admitting: Interventional Cardiology

## 2016-08-22 DIAGNOSIS — F411 Generalized anxiety disorder: Secondary | ICD-10-CM | POA: Diagnosis not present

## 2016-08-22 DIAGNOSIS — F39 Unspecified mood [affective] disorder: Secondary | ICD-10-CM | POA: Diagnosis not present

## 2016-09-05 ENCOUNTER — Encounter: Payer: Self-pay | Admitting: Family Medicine

## 2016-09-05 ENCOUNTER — Ambulatory Visit (INDEPENDENT_AMBULATORY_CARE_PROVIDER_SITE_OTHER): Payer: PPO | Admitting: Family Medicine

## 2016-09-05 DIAGNOSIS — J439 Emphysema, unspecified: Secondary | ICD-10-CM

## 2016-09-05 MED ORDER — PREDNISONE 10 MG PO TABS: ORAL_TABLET | ORAL | 0 refills | Status: DC

## 2016-09-05 MED ORDER — DOXYCYCLINE HYCLATE 100 MG PO TABS: 100.0000 mg | ORAL_TABLET | Freq: Two times a day (BID) | ORAL | 0 refills | Status: DC

## 2016-09-05 MED ORDER — GUAIFENESIN ER 600 MG PO TB12: 600.0000 mg | ORAL_TABLET | Freq: Two times a day (BID) | ORAL | Status: DC

## 2016-09-05 NOTE — Patient Instructions (Signed)
Restart albuterol.  Start doxy, use a probiotic.  Take prednisone with food.  Update [place] as needed.  To ER if profoundly worse.  Take care.  Glad to see you.

## 2016-09-05 NOTE — Progress Notes (Signed)
Pre visit review using our clinic review tool, if applicable. No additional management support is needed unless otherwise documented below in the visit note. 

## 2016-09-05 NOTE — Assessment & Plan Note (Signed)
Presumed exacerbation, d/w pt.  See AVS.   Doxy, pred, SABA., routine cautions d/w pt.  He agrees.

## 2016-09-05 NOTE — Progress Notes (Signed)
He improved and was back to baseline after prev hospitalization.  Sx started about 5 days ago.  First noted post nasal gtt, with throat irritation.  Then more cough and some sputum.  More wheeze noted.  He isn't improved in the meantime, is gradually getting worse daily.  "I want to catch this before I end up on the hospital."  No vomiting, no diarrhea.  No fevers.  Last used inhalers last night.  No supplemental O2 use.  He doesn't feel like he needs to be at the hospital now.  No rash.  He can tolerate prednisone.  Hasn't used SABA recently.   Sputum is grey.   Meds, vitals, and allergies reviewed.   ROS: Per HPI unless specifically indicated in ROS section   nad ncat Mmm Neck supple, no LA rrr ctab, no focal dec in BS, no wheeze currently.  abd soft w/normal BS Ext w/trace BLE edema.

## 2016-09-17 ENCOUNTER — Other Ambulatory Visit: Payer: Self-pay | Admitting: *Deleted

## 2016-09-17 DIAGNOSIS — D3132 Benign neoplasm of left choroid: Secondary | ICD-10-CM | POA: Diagnosis not present

## 2016-09-17 DIAGNOSIS — Z961 Presence of intraocular lens: Secondary | ICD-10-CM | POA: Diagnosis not present

## 2016-09-17 MED ORDER — ATORVASTATIN CALCIUM 80 MG PO TABS: 80.0000 mg | ORAL_TABLET | Freq: Every day | ORAL | 2 refills | Status: DC

## 2016-09-28 ENCOUNTER — Telehealth: Payer: Self-pay | Admitting: Internal Medicine

## 2016-09-28 NOTE — Telephone Encounter (Signed)
Form in Dr Letvak's Inbox on his desk 

## 2016-09-28 NOTE — Telephone Encounter (Signed)
Wife dropped off handicap form for [employer].  Please call when ready for pickup. Placing in rx tower   Thanks

## 2016-10-01 NOTE — Telephone Encounter (Signed)
I left a message on patient's voice mail that form is ready for pick up. °

## 2016-10-01 NOTE — Telephone Encounter (Signed)
Form done No charge 

## 2016-10-09 ENCOUNTER — Encounter: Payer: Self-pay | Admitting: Emergency Medicine

## 2016-10-09 ENCOUNTER — Ambulatory Visit (INDEPENDENT_AMBULATORY_CARE_PROVIDER_SITE_OTHER): Payer: PPO | Admitting: Emergency Medicine

## 2016-10-09 DIAGNOSIS — J301 Allergic rhinitis due to pollen: Secondary | ICD-10-CM | POA: Diagnosis not present

## 2016-10-09 DIAGNOSIS — J439 Emphysema, unspecified: Secondary | ICD-10-CM

## 2016-10-09 NOTE — Progress Notes (Signed)
Subjective:    Patient ID: Eduardo [name], male    DOB: December 21, 1930, 83 y.o.   MRN: [id number] HPI 83 yo man, former smoker, CAD s/p MI 1/12, A Fib, allergies, exertional SOB for a few yrs. This has been worse since recent hosp in 1/12 - was cathed x 2, no intervention made, course c/b renal failure. 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 No hypoxemia reported with exercise. Was sent from the hospital on metoprolol (new). He has heard wheezing, especially when nasal allergies. Arlyce Harman at Dr Alla German office suggested AFL/COPD.    ROV 08/27/14 -- follow up visit for COPD and allergic rhinitis, cough. He tells me that he has continued to have a lot of mucous - uses NSW's regularly. He does build up mucous overnight but it doesn't wake him. He is congested in the am. He is on zyrtec, no longer on singulair.  We discussed pursed lip breathing and incentive spirometry today. He is on Breo and tolerates well. His activity level is pretty good but spinal stenosis does slow him down. He works with a Clinical research associate 2x a week. No exacerbations since last time.   ROV 10/25/14 -- follow-up visit for COPD (FEV1 1.03 L or 50% predicted, 09/2010), cough and allergic rhinitis. He reports today progressive dyspnea for last 3 months, happens when sleeping and sometimes when supine. His wife has also noted some exertional SOB, has to stop after 200 feet. He is on Breo, but we stopped spiriva. With these new sx he added back spiriva 4 days ago. He is still able to do his daily workouts, trainer 2x a week.  No wheeze or cough. His wife is interested in looking into getting a scooter for him.   ROV 03/11/15 -- follow up visit for severe COPD, rhinitis and chronic cough. 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 His cognitive abilities are improved to baseline. Breathing has been good since last visit.  He usually has trouble with Fall allergies. He is on  Zyrtec, astelin prn.  He is stable on breo and spiriva. He is able to walk indefinitely. Sometimes coughs. No wheezing.   ROV 09/09/15 -- patient with a history of significant COPD, allergic rhinitis, chronic cough. Been following him for the last 5-6 years. He was last seen in our office by Dr Melvyn Novas in December 2016 at which time he was treated for an acute exacerbation and also given a trial of Stiolto. He was subsequently continued on Spiriva until it ran out. He then switched to [facility] and he feels that he is doing well on it. 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 He is now back on his anti-coagulation. He is doing well, breathing is not limiting. He has astelin NS that he can use prn. Is on zyrtec.   ROV 04/16/16 -- patient has a history of COPD, allergic rhinitis, chronic cough. He is currently managed on Stiolto. He feels that he has been doing fairly well. He has been less active due to back discomfort and increased dyspnea. He does exercise twice a week. No acute flares since last time. He has a lot of nasal gtt at night, a lot of throat clearing and cough at night.   Hospital f/u visit 08/02/16 -- patient has a history of COPD, allergic rhinitis, chronic cough and upper airway instability. 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  He believes that he is improving significantly - starting to build up his strength. He has finished prednisone.   ROV 10/09/16 -- this follow-up visit for patient with a history of COPD, chronic cough in the setting of upper airway instability and allergic rhinitis. Currently managed on Stiolto. Also using Zyrtec and Atrovent nasal spray as needed. He reports today that he has been doing [place] since last visit. He is having a bit more exertional SOB, has still been working with PT, no desaturations with his exercise documented per him. He is using albuterol neb qhs.  He was recently treated with pred and doxy for a possible bronchitis / AE   Review of Systems As per HPI    Objective:  Physical Exam Vitals:   10/09/16 1343  BP: 128/66  Pulse: 82  SpO2: 95%  Weight: 150 lb 9.6 oz (68.3 kg)  Height: 5\' 3"  (1.6 m)   Gen: Pleasant, well-nourished, in no distress,  normal affect  ENT: No lesions,  mouth clear,  oropharynx clear, no postnasal drip  Neck: No JVD, no stridor today  Lungs: No use of accessory muscles, clear B   Cardiovascular: irregular, heart sounds normal, no murmur or gallops, no peripheral edema  Musculoskeletal: No deformities, no cyanosis or clubbing  Neuro: alert, non focal  Skin: Warm, no lesions or rashes   Assessment & Plan:  COPD (chronic obstructive pulmonary disease) with emphysema (HCC) With a recent acute exacerbation in the setting of allergy season. He was treated with prednisone and doxycycline. He now feels improved. He notes some exertional dyspnea and that has not gotten back to his prior baseline compared with February 2018. He continues to work with physical therapy.  Continue your Stiolto once a day Use your albuterol, either nebulized or 2 puffs up to every 4 hours if needed for shortness of breath.  Continue to work with your physical therapist  Follow with Dr Lamonte Sakai in 3 months or sooner if you have any problems.  Allergic rhinitis Please continue your zyrtec and atrovent nasal spray  Baltazar Apo, MD, PhD 10/09/2016, 2:29 PM [facility] [phone] or if no answer [phone]

## 2016-10-09 NOTE — Assessment & Plan Note (Signed)
With a recent acute exacerbation in the setting of allergy season. He was treated with prednisone and doxycycline. He now feels improved. He notes some exertional dyspnea and that has not gotten back to his prior baseline compared with February 2018. He continues to work with physical therapy.  Continue your Stiolto once a day Use your albuterol, either nebulized or 2 puffs up to every 4 hours if needed for shortness of breath.  Continue to work with your physical therapist  Follow with Dr Lamonte Sakai in 3 months or sooner if you have any problems.

## 2016-10-09 NOTE — Assessment & Plan Note (Signed)
Please continue your zyrtec and atrovent nasal spray

## 2016-10-09 NOTE — Patient Instructions (Addendum)
Please continue your zyrtec and atrovent nasal spray Continue your Stiolto once a day Use your albuterol, either nebulized or 2 puffs up to every 4 hours if needed for shortness of breath.  Continue to work with your physical therapist  Follow with Dr Lamonte Sakai in 3 months or sooner if you have any problems.

## 2016-10-11 ENCOUNTER — Other Ambulatory Visit: Payer: Self-pay

## 2016-10-11 ENCOUNTER — Encounter: Payer: Self-pay | Admitting: Emergency Medicine

## 2016-10-11 ENCOUNTER — Telehealth: Payer: Self-pay | Admitting: Emergency Medicine

## 2016-10-11 MED ORDER — ALBUTEROL SULFATE (2.5 MG/3ML) 0.083% IN NEBU: 2.5000 mg | INHALATION_SOLUTION | RESPIRATORY_TRACT | 1 refills | Status: DC | PRN

## 2016-10-11 NOTE — Telephone Encounter (Signed)
lmtcb X1 for pt  

## 2016-10-11 NOTE — Telephone Encounter (Signed)
[facility] requested 90 vials for Albuterol 0.083%. rx sent.

## 2016-10-11 NOTE — Telephone Encounter (Signed)
See also- phone note from today on same message.

## 2016-10-12 NOTE — Telephone Encounter (Signed)
lmomtcb x 2 for the pt.  

## 2016-10-12 NOTE — Telephone Encounter (Signed)
Patient returned phone call, about Albuterol(Proventil) 2.5mg /3Ml 0.083% nebulizer solution.Eduardo [name]

## 2016-10-12 NOTE — Telephone Encounter (Signed)
lmtcb

## 2016-10-15 ENCOUNTER — Encounter: Payer: Self-pay | Admitting: Interventional Cardiology

## 2016-10-15 NOTE — Telephone Encounter (Signed)
Pt returned call said he had received med and is fine no need to call him back.Eduardo [name]

## 2016-10-15 NOTE — Telephone Encounter (Signed)
Called pharmacy and spoke with Joellen Jersey who states they have already taken care of what the pt was requesting. (please see pt email) She states pt has already picked up his corrected box of medication. Pt want 90 individual vials she states. LMTCB x1 to pt to make sure nothing further is needed since pharmacy has taken care of him

## 2016-10-18 ENCOUNTER — Telehealth: Payer: Self-pay | Admitting: *Deleted

## 2016-10-18 NOTE — Telephone Encounter (Signed)
-----   Message from Belva Crome, MD sent at 10/18/2016  2:16 PM EDT ----- Regarding: Higher blood pressure He is concerned about an increase in blood pressure. Below is an electronic message he sent me and my reply. Please find out if he is off both hydralazine and furosemide. I believe we stopped these because he had a rash. Once I know this, I may be able to make a recommendation.     "Did the rash go away? Did you stop both hydralazine and furosemide? ===View-only below this line===   ----- Message -----    From: Calla Kicks. Elkhatib    Sent: 10/15/2016 12:44 PM EDT      To: Sinclair Grooms, MD Subject: Non-Urgent Medical Question  Re: Blood pressure.  For the past two weeks my BP has been 15 points higher (in the lower 160's) at rest.  The result is my cutoff of physical training as you suggested at BP at 160.  What can be done to lower the BP so I can return to PT? Thanks for looking into this for me."

## 2016-10-18 NOTE — Telephone Encounter (Signed)
Left message to call back  

## 2016-10-21 NOTE — Progress Notes (Addendum)
Cardiology Office Note    Date:  10/22/2016   ID:  Eduardo [name], DOB 01-Oct-1930, MRN [id number]  PCP:  Venia Carbon, MD  Cardiologist: Sinclair Grooms, MD   Chief Complaint  Patient presents with  . Coronary Artery Disease    History of Present Illness:  Eduardo [name] is a 83 y.o. male with a history of CAD (cath 2012 c/b dissection of RCA with ST elevation/temp pacer, medically managed), chronic atrial fib on Eliquis, chronic respiratory failure/COPD, HLD and CVA   Euel has no complaints of angina, edema, orthopnea, PND, or syncope. He does not notice any irregularity of heartbeat. Nitroglycerin has not been needed. He is sleeping relatively well. He is frustrated by rehabilitation being canceled if his systolic blood pressures greater than 160 mmHg. He has got hydralazine back to 10 mg twice a day without medical approval. Overall, he feels well.  Past Medical History:  Diagnosis Date  . Allergy   . Anemia 2012, 08/2015   chronic. Acute due to large hematoma 2013. 2017: due to gastric AVM bleeding.   . Anxiety   . Atrial fibrillation ([place]) 2012   a.  CHA2DS2VASc = 6-->eliquis;  b. 12/2015 Echo: EF 60-65%, no rwma, LVH, mild AS/MS/MR, sev dil LA, mild TR, PASP 54mmHg.  Marland Kitchen CAD (coronary artery disease)    a. 06/2010 Cath: LM nl, LAD 50p/m, LCX 64m, RCA 50p/m -->catheter dissection but good flow, 70d, RPDA 50-70;  b. 06/2010 Relook cath due to bradycardia and inf ST elev: RCA dissection @ ostium extending into coronary cusp and prox RCA, Ao dissection-->less staining than previously-->Med Rx.  Marland Kitchen COPD (chronic obstructive pulmonary disease) ([place]) 2013   exertional dyspnea  . Gastric angiodysplasia with hemorrhage 08/2015  . Hx of colonic polyps 2001, 2011   adenomatous 2001. HP 2001, 2011.  Marland Kitchen Hyperlipidemia   . Myocardial infarction ([place]) 06/2010.  12/2011.   "twice; 1 day apart; during/after cath". NQMI in setting severe anemia 2013.   Marland Kitchen Neuropathy 2014   in feet,  likely due to spinal stenosis, DDD, HNP  . Osteoarthritis 2002   spinal stenosis, spondylolisthesis, disc protrusion multilevel in lumbar spine  . Stroke ([place]) 2016  . Upper GI bleed 08/2015   EGD: 2 small gastric AVMs, 1 actively bleeding.  Both treated with APC ablation, clipping of the bleeder    Past Surgical History:  Procedure Laterality Date  . APPENDECTOMY    . CARDIAC CATHETERIZATION  2012   50% LAD and luminal irreg in RCA, 1/12  Post cath MI from damage  . CARPAL TUNNEL RELEASE     left  . CARPAL TUNNEL RELEASE  10/11/11   Dr Rush Barer  . CATARACT EXTRACTION W/ INTRAOCULAR LENS IMPLANT  2/13   Dr [name]  . COLONOSCOPY  2001, 02/2010   Dr Deatra Ina.   . ESOPHAGOGASTRODUODENOSCOPY N/A 08/15/2015   [name] MD; 2 small gastric AVMs, 1 actively bleeding.  Both treated with APC ablation, clipping of the bleeder  . FOOT FRACTURE SURGERY     right heel repair  . FRACTURE SURGERY    . KNEE ARTHROSCOPY     left, right knee 10/2009  . MASTOIDECTOMY     x3 on right  . NASAL SEPTUM SURGERY     nasal septal repair  . PENILE PROSTHESIS  REMOVAL    . PENILE PROSTHESIS IMPLANT     x 2--got infection after 2nd and had to remove  . PILONIDAL CYST / SINUS  EXCISION    . SHOULDER ARTHROSCOPY     right  . TONSILLECTOMY AND ADENOIDECTOMY      Current Medications: Outpatient Medications Prior to Visit  Medication Sig Dispense Refill  . acetaminophen (TYLENOL) 650 MG CR tablet Take 1,300 mg by mouth 2 (two) times daily.    Marland Kitchen albuterol (PROAIR HFA) 108 (90 Base) MCG/ACT inhaler Inhale 2 puffs into the lungs every 6 (six) hours as needed for wheezing or shortness of breath. 18 g 0  . albuterol (PROVENTIL) (2.5 MG/3ML) 0.083% nebulizer solution Take 3 mLs (2.5 mg total) by nebulization every 4 (four) hours as needed for wheezing or shortness of breath. 90 vial 1  . apixaban (ELIQUIS) 5 MG TABS tablet Take 1 tablet (5 mg total) by mouth 2 (two) times daily. 180 tablet 3  .  atorvastatin (LIPITOR) 80 MG tablet Take 1 tablet (80 mg total) by mouth daily. 90 tablet 2  . azelastine (ASTELIN) 0.1 % nasal spray Place 2 sprays into both nostrils at bedtime. Use in each nostril as directed    . B Complex-C (SUPER B COMPLEX PO) Take 1 tablet by mouth at bedtime.     . cetirizine (ZYRTEC) 10 MG tablet Take 10 mg by mouth at bedtime.     . cholecalciferol (VITAMIN D) 1000 units tablet Take 1,000 Units by mouth daily.    Marland Kitchen escitalopram (LEXAPRO) 20 MG tablet Take 20 mg by mouth at bedtime.     . fenofibrate 160 MG tablet Take 1 tablet (160 mg total) by mouth at bedtime. 90 tablet 3  . guaiFENesin (MUCINEX) 600 MG 12 hr tablet Take 1 tablet (600 mg total) by mouth 2 (two) times daily.    Marland Kitchen ipratropium (ATROVENT) 0.03 % nasal spray Place 2 sprays into both nostrils every 12 (twelve) hours.    Marland Kitchen lamoTRIgine (LAMICTAL) 150 MG tablet Take 150 mg by mouth at bedtime. Reported on 08/14/2015    . Multiple Vitamin (MULTIVITAMIN) tablet Take 1 tablet by mouth at bedtime.     . pantoprazole (PROTONIX) 40 MG tablet TAKE 1 TABLET (40 MG TOTAL) BY MOUTH DAILY BEFORE BREAKFAST. 90 tablet 3  . STIOLTO RESPIMAT 2.5-2.5 MCG/ACT AERS INHALE 2 PUFFS INTO THE LUNGS DAILY 12 Inhaler 3  . triamcinolone cream (KENALOG) 0.1 % Apply 1 application topically 2 (two) times daily as needed (for skin).     . verapamil (CALAN-SR) 240 MG CR tablet Take 1 tablet (240 mg total) by mouth at bedtime. 90 tablet 1   No facility-administered medications prior to visit.      Allergies:   Penicillins; Sulfonamide derivatives; and Tape   Social History   Social History  . Marital status: Married    Spouse name: N/A  . Number of children: 4  . Years of education: N/A   Occupational History  . Retired Chief Strategy Officer the job trainin)    Social History Main Topics  . Smoking status: Former Smoker    Packs/day: 2.50    Years: 30.00    Types: Cigarettes    Quit date: 06/11/1978  . Smokeless tobacco:  Never Used  . Alcohol use No     Comment: QUIT [id number] alcoholic  . Drug use: No  . Sexual activity: Not Currently   Other Topics Concern  . None   Social History Narrative   Grew up in [place], [place].  Lives in [place].   Married---2nd   2 children ([place], [place]  and near [place] stepchildren   Former  Smoker--quit in 1980   Alcohol use-no. Quit in 1980--alcoholic      Has living will.   Has designated wife, then step daughter [name] have health care POA.   Would accept resuscitation attempts.   No feeding tube if cognitively unaware.     Family History:  The patient's family history includes Cancer in his mother; Heart attack in his father.   ROS:   Please see the history of present illness.    Back discomfort, easy bruising, difficulty with balance, anxiety, and irregular heartbeat.  All other systems reviewed and are negative.   PHYSICAL EXAM:   VS:  BP (!) 142/70 (BP Location: Left Arm)   Pulse 69   Ht 5\' 3"  (1.6 m)   Wt 149 lb (67.6 kg)   BMI 26.39 kg/m    GEN: Well nourished, well developed, in no acute distress  HEENT: normal  Neck: no JVD, carotid bruits, or masses Cardiac: IIRR; no murmurs, rubs, or gallops. 1+ bilateral edema and his ankles and shin level. Respiratory:  clear to auscultation bilaterally, normal work of breathing GI: soft, nontender, nondistended, + BS MS: no deformity or atrophy  Skin: warm and dry, no rash Neuro:  Alert and Oriented x 3, Strength and sensation are intact Psych: euthymic mood, full affect  Wt Readings from Last 3 Encounters:  10/22/16 149 lb (67.6 kg)  10/09/16 150 lb 9.6 oz (68.3 kg)  09/05/16 151 lb 8 oz (68.7 kg)      Studies/Labs Reviewed:   EKG:  EKG  Not performed.  Recent Labs: 07/13/2016: B Natriuretic Peptide 261.9 07/18/2016: ALT 47; Magnesium 2.5; TSH 0.133 07/24/2016: Hemoglobin 9.9; Platelets 341.0 07/30/2016: NT-Pro BNP 1,562 08/08/2016: BUN 25; Creatinine, Ser 0.99; Potassium 4.1; Sodium  143   Lipid Panel    Component Value Date/Time   CHOL 134 05/18/2016 0810   TRIG (H) 05/18/2016 0810    952.0 Triglyceride is over 400; calculations on Lipids are invalid.   HDL 42.80 05/18/2016 0810   CHOLHDL 3 05/18/2016 0810   VLDL UNABLE TO CALCULATE IF TRIGLYCERIDE OVER 400 mg/dL 12/31/2014 0510   LDLCALC UNABLE TO CALCULATE IF TRIGLYCERIDE OVER 400 mg/dL 12/31/2014 0510   LDLDIRECT 65.0 05/18/2016 0810    Additional studies/ records that were reviewed today include:  No new data    ASSESSMENT:    1. Atrial fibrillation with rapid ventricular response ([place])   2. Chronic diastolic HF (heart failure) ([place])   3. Coronary artery disease involving native coronary artery of native heart without angina pectoris   4. Essential hypertension   5. Gastric angiodysplasia with hemorrhage   6. MI, old with prior NSTEMI x 2   7. Hemiparesis affecting right side as late effect of stroke ([place])   8. Pulmonary emphysema, unspecified emphysema type ([place])   9. Chronic anticoagulation   10. Dyslipidemia      PLAN:  In order of problems listed above:  1. Clinically, his rate is well controlled. 2. No evidence of volume overload on exam other than ankle edema which has a component of dependency. 3. Asymptomatic with reference to angina. 4. This is been a concern. Frequent systolic pressures greater than 160 mmHg. His reported blood pressures 01 stat he takes first thing in the morning after awakening and prior to his morning antihypertensive regimen. He also knows that hydralazine has been decreased to twice daily from 3 times per day (he did this on his own). Plan to increase hydralazine back to 10 mg 3  times a day. Advised him to record blood pressures at least 2 hours after a.m. medication is ingested. Cautioned not to exercise of systolic pressures are running above 160 mmHg. Encouraged feedback. 5. Not addressed 6. Not addressed 7. Continue restorative physical therapy for balance and  strengthening. 8. Not addressed 9. No bleeding complications on Eliquis.  Continue to monitor blood pressure. Hydralazine is been increased to 10 mg every 8 hours. Blood pressure recording should be made 2 hours after antihypertensive dosing.  Medication Adjustments/Labs and Tests Ordered: Current medicines are reviewed at length with the patient today.  Concerns regarding medicines are outlined above.  Medication changes, Labs and Tests ordered today are listed in the Patient Instructions below. Patient Instructions  Medication Instructions:  Increase hydralazine to 10 mg three times daily  Labwork: None  Testing/Procedures: None   Follow-Up: Your physician wants you to follow-up in: 6-9 months with Dr Tamala Julian..You will receive a reminder letter in the mail two months in advance. If you don't receive a letter, please call our office to schedule the follow-up appointment.   Any Other Special Instructions Will Be Listed Below (If Applicable).  You may exercise if your systolic blood pressure (top number ) is less than 160.    If you need a refill on your cardiac medications before your next appointment, please call your pharmacy.      Signed, Sinclair Grooms, MD  10/22/2016 12:03 PM    [facility] [address] [place], [place], [place]  [address] Phone: [phone]; Fax: [phone]

## 2016-10-22 ENCOUNTER — Ambulatory Visit (INDEPENDENT_AMBULATORY_CARE_PROVIDER_SITE_OTHER): Payer: PPO | Admitting: Interventional Cardiology

## 2016-10-22 ENCOUNTER — Encounter: Payer: Self-pay | Admitting: Interventional Cardiology

## 2016-10-22 VITALS — BP 142/70 | HR 69 | Ht 63.0 in | Wt 149.0 lb

## 2016-10-22 DIAGNOSIS — K31811 Angiodysplasia of stomach and duodenum with bleeding: Secondary | ICD-10-CM | POA: Diagnosis not present

## 2016-10-22 DIAGNOSIS — I69351 Hemiplegia and hemiparesis following cerebral infarction affecting right dominant side: Secondary | ICD-10-CM | POA: Diagnosis not present

## 2016-10-22 DIAGNOSIS — Z7901 Long term (current) use of anticoagulants: Secondary | ICD-10-CM | POA: Diagnosis not present

## 2016-10-22 DIAGNOSIS — I10 Essential (primary) hypertension: Secondary | ICD-10-CM

## 2016-10-22 DIAGNOSIS — I4891 Unspecified atrial fibrillation: Secondary | ICD-10-CM | POA: Diagnosis not present

## 2016-10-22 DIAGNOSIS — I252 Old myocardial infarction: Secondary | ICD-10-CM

## 2016-10-22 DIAGNOSIS — J439 Emphysema, unspecified: Secondary | ICD-10-CM | POA: Diagnosis not present

## 2016-10-22 DIAGNOSIS — I5032 Chronic diastolic (congestive) heart failure: Secondary | ICD-10-CM | POA: Diagnosis not present

## 2016-10-22 DIAGNOSIS — I2510 Atherosclerotic heart disease of native coronary artery without angina pectoris: Secondary | ICD-10-CM | POA: Diagnosis not present

## 2016-10-22 DIAGNOSIS — E785 Hyperlipidemia, unspecified: Secondary | ICD-10-CM

## 2016-10-22 MED ORDER — HYDRALAZINE HCL 10 MG PO TABS: 10.0000 mg | ORAL_TABLET | Freq: Three times a day (TID) | ORAL | 3 refills | Status: DC

## 2016-10-22 MED ORDER — FUROSEMIDE 20 MG PO TABS: 20.0000 mg | ORAL_TABLET | Freq: Every day | ORAL | 3 refills | Status: DC

## 2016-10-22 MED ORDER — VERAPAMIL HCL ER 240 MG PO TBCR: 240.0000 mg | EXTENDED_RELEASE_TABLET | Freq: Every day | ORAL | 3 refills | Status: DC

## 2016-10-22 NOTE — Telephone Encounter (Signed)
Pt seen in clinic today by Dr.Smith.

## 2016-10-22 NOTE — Patient Instructions (Signed)
Medication Instructions:  Increase hydralazine to 10 mg three times daily  Labwork: None  Testing/Procedures: None   Follow-Up: Your physician wants you to follow-up in: 6-9 months with Dr Tamala Julian..You will receive a reminder letter in the mail two months in advance. If you don't receive a letter, please call our office to schedule the follow-up appointment.   Any Other Special Instructions Will Be Listed Below (If Applicable).  You may exercise if your systolic blood pressure (top number ) is less than 160.    If you need a refill on your cardiac medications before your next appointment, please call your pharmacy.

## 2016-11-27 DIAGNOSIS — F411 Generalized anxiety disorder: Secondary | ICD-10-CM | POA: Diagnosis not present

## 2016-11-27 DIAGNOSIS — F39 Unspecified mood [affective] disorder: Secondary | ICD-10-CM | POA: Diagnosis not present

## 2016-12-03 ENCOUNTER — Ambulatory Visit (INDEPENDENT_AMBULATORY_CARE_PROVIDER_SITE_OTHER): Payer: PPO | Admitting: Internal Medicine

## 2016-12-03 ENCOUNTER — Encounter: Payer: Self-pay | Admitting: Internal Medicine

## 2016-12-03 VITALS — BP 134/70 | HR 69 | Temp 97.5°F | Wt 151.0 lb

## 2016-12-03 DIAGNOSIS — R109 Unspecified abdominal pain: Secondary | ICD-10-CM

## 2016-12-03 DIAGNOSIS — K31811 Angiodysplasia of stomach and duodenum with bleeding: Secondary | ICD-10-CM

## 2016-12-03 NOTE — Progress Notes (Signed)
Subjective:    Patient ID: Eduardo [name], male    DOB: June 30, 1930, 83 y.o.   MRN: [id number]  HPI Here with wife due to back pain  Noticed pain along posterior right flank--first noted yesterday May have hit counter yesterday Sensitive to touch So painful,he could barely get out of bed this morning Called PT--had appt this morning. Advised appt here  No sig radiation of pain  Current Outpatient Prescriptions on File Prior to Visit  Medication Sig Dispense Refill  . acetaminophen (TYLENOL) 650 MG CR tablet Take 1,300 mg by mouth 2 (two) times daily.    Marland Kitchen albuterol (PROAIR HFA) 108 (90 Base) MCG/ACT inhaler Inhale 2 puffs into the lungs every 6 (six) hours as needed for wheezing or shortness of breath. 18 g 0  . albuterol (PROVENTIL) (2.5 MG/3ML) 0.083% nebulizer solution Take 3 mLs (2.5 mg total) by nebulization every 4 (four) hours as needed for wheezing or shortness of breath. 90 vial 1  . apixaban (ELIQUIS) 5 MG TABS tablet Take 1 tablet (5 mg total) by mouth 2 (two) times daily. 180 tablet 3  . atorvastatin (LIPITOR) 80 MG tablet Take 1 tablet (80 mg total) by mouth daily. 90 tablet 2  . azelastine (ASTELIN) 0.1 % nasal spray Place 2 sprays into both nostrils at bedtime. Use in each nostril as directed    . B Complex-C (SUPER B COMPLEX PO) Take 1 tablet by mouth at bedtime.     . cetirizine (ZYRTEC) 10 MG tablet Take 10 mg by mouth at bedtime.     . cholecalciferol (VITAMIN D) 1000 units tablet Take 1,000 Units by mouth daily.    Marland Kitchen escitalopram (LEXAPRO) 20 MG tablet Take 20 mg by mouth at bedtime.     . fenofibrate 160 MG tablet Take 1 tablet (160 mg total) by mouth at bedtime. 90 tablet 3  . furosemide (LASIX) 20 MG tablet Take 1 tablet (20 mg total) by mouth daily. 90 tablet 3  . hydrALAZINE (APRESOLINE) 10 MG tablet Take 1 tablet (10 mg total) by mouth 3 (three) times daily. 270 tablet 3  . ipratropium (ATROVENT) 0.03 % nasal spray Place 2 sprays into both nostrils every  12 (twelve) hours.    Marland Kitchen lamoTRIgine (LAMICTAL) 150 MG tablet Take 150 mg by mouth at bedtime. Reported on 08/14/2015    . Multiple Vitamin (MULTIVITAMIN) tablet Take 1 tablet by mouth at bedtime.     . pantoprazole (PROTONIX) 40 MG tablet TAKE 1 TABLET (40 MG TOTAL) BY MOUTH DAILY BEFORE BREAKFAST. 90 tablet 3  . STIOLTO RESPIMAT 2.5-2.5 MCG/ACT AERS INHALE 2 PUFFS INTO THE LUNGS DAILY 12 Inhaler 3  . triamcinolone cream (KENALOG) 0.1 % Apply 1 application topically 2 (two) times daily as needed (for skin).     . verapamil (CALAN-SR) 240 MG CR tablet Take 1 tablet (240 mg total) by mouth at bedtime. 90 tablet 3   No current facility-administered medications on file prior to visit.     Allergies  Allergen Reactions  . Penicillins Itching    Has patient had a PCN reaction causing immediate rash, facial/tongue/throat swelling, SOB or lightheadedness with hypotension: Yes Has patient had a PCN reaction causing severe rash involving mucus membranes or skin necrosis: No Has patient had a PCN reaction that required hospitalization No Has patient had a PCN reaction occurring within the last 10 years: No If all of the above answers are "NO", then may proceed with Cephalosporin use.   . Sulfonamide Derivatives  Other (See Comments)    "it affected my kidneys"  . Tape Other (See Comments)    Arms black and blue, tears skin.  Please use "paper" tape    Past Medical History:  Diagnosis Date  . Allergy   . Anemia 2012, 08/2015   chronic. Acute due to large hematoma 2013. 2017: due to gastric AVM bleeding.   . Anxiety   . Atrial fibrillation ([place]) 2012   a.  CHA2DS2VASc = 6-->eliquis;  b. 12/2015 Echo: EF 60-65%, no rwma, LVH, mild AS/MS/MR, sev dil LA, mild TR, PASP 90mmHg.  Marland Kitchen CAD (coronary artery disease)    a. 06/2010 Cath: LM nl, LAD 50p/m, LCX 38m, RCA 50p/m -->catheter dissection but good flow, 70d, RPDA 50-70;  b. 06/2010 Relook cath due to bradycardia and inf ST elev: RCA dissection @ ostium  extending into coronary cusp and prox RCA, Ao dissection-->less staining than previously-->Med Rx.  Marland Kitchen COPD (chronic obstructive pulmonary disease) ([place]) 2013   exertional dyspnea  . Gastric angiodysplasia with hemorrhage 08/2015  . Hx of colonic polyps 2001, 2011   adenomatous 2001. HP 2001, 2011.  Marland Kitchen Hyperlipidemia   . Myocardial infarction ([place]) 06/2010.  12/2011.   "twice; 1 day apart; during/after cath". NQMI in setting severe anemia 2013.   Marland Kitchen Neuropathy 2014   in feet, likely due to spinal stenosis, DDD, HNP  . Osteoarthritis 2002   spinal stenosis, spondylolisthesis, disc protrusion multilevel in lumbar spine  . Stroke ([place]) 2016  . Upper GI bleed 08/2015   EGD: 2 small gastric AVMs, 1 actively bleeding.  Both treated with APC ablation, clipping of the bleeder    Past Surgical History:  Procedure Laterality Date  . APPENDECTOMY    . CARDIAC CATHETERIZATION  2012   50% LAD and luminal irreg in RCA, 1/12  Post cath MI from damage  . CARPAL TUNNEL RELEASE     left  . CARPAL TUNNEL RELEASE  10/11/11   Dr Rush Barer  . CATARACT EXTRACTION W/ INTRAOCULAR LENS IMPLANT  2/13   Dr [name]  . COLONOSCOPY  2001, 02/2010   Dr Deatra Ina.   . ESOPHAGOGASTRODUODENOSCOPY N/A 08/15/2015   [name] MD; 2 small gastric AVMs, 1 actively bleeding.  Both treated with APC ablation, clipping of the bleeder  . FOOT FRACTURE SURGERY     right heel repair  . FRACTURE SURGERY    . KNEE ARTHROSCOPY     left, right knee 10/2009  . MASTOIDECTOMY     x3 on right  . NASAL SEPTUM SURGERY     nasal septal repair  . PENILE PROSTHESIS  REMOVAL    . PENILE PROSTHESIS IMPLANT     x 2--got infection after 2nd and had to remove  . PILONIDAL CYST / SINUS EXCISION    . SHOULDER ARTHROSCOPY     right  . TONSILLECTOMY AND ADENOIDECTOMY      Family History  Problem Relation Age of Onset  . Cancer Mother        ? leukemia  . Heart attack Father   . Diabetes Neg Hx   . Hypertension Neg Hx      Social History   Social History  . Marital status: Married    Spouse name: N/A  . Number of children: 4  . Years of education: N/A   Occupational History  . Retired Chief Strategy Officer the job trainin)    Social History Main Topics  . Smoking status: Former Smoker    Packs/day: 2.50  Years: 30.00    Types: Cigarettes    Quit date: 06/11/1978  . Smokeless tobacco: Never Used  . Alcohol use No     Comment: QUIT [id number] alcoholic  . Drug use: No  . Sexual activity: Not Currently   Other Topics Concern  . Not on file   Social History Narrative   Grew up in [place], [place].  Lives in [place].   Married---2nd   2 children ([place], [place]  and near [place])--2 stepchildren   Former Smoker--quit in 1980   Alcohol use-no. Quit in 1980--alcoholic      Has living will.   Has designated wife, then step daughter [name] have health care POA.   Would accept resuscitation attempts.   No feeding tube if cognitively unaware.   Review of Systems No hematuria or dysuria No fever Appetite is okay No cough or breathing problems    Objective:   Physical Exam  Constitutional: He appears well-nourished. No distress.  Pulmonary/Chest: Effort normal and breath sounds normal. No respiratory distress. He has no wheezes. He has no rales.  Musculoskeletal:  Localized pain along posterior axillary line ~T9-10 No apparent bruising No restriction in arm or leg movement          Assessment & Plan:

## 2016-12-03 NOTE — Assessment & Plan Note (Signed)
No recent problems Discussed---okay to try PPI every other day Avoid all NSAIDs

## 2016-12-03 NOTE — Assessment & Plan Note (Signed)
Seems to be local/muscular injury May have been injured against the counter No clear contusion Discussed local care No evidence of pulmonary or nephric injury

## 2016-12-30 ENCOUNTER — Encounter: Payer: Self-pay | Admitting: Internal Medicine

## 2017-01-09 DIAGNOSIS — L853 Xerosis cutis: Secondary | ICD-10-CM | POA: Diagnosis not present

## 2017-01-09 DIAGNOSIS — L720 Epidermal cyst: Secondary | ICD-10-CM | POA: Diagnosis not present

## 2017-01-09 DIAGNOSIS — L821 Other seborrheic keratosis: Secondary | ICD-10-CM | POA: Diagnosis not present

## 2017-01-09 DIAGNOSIS — L308 Other specified dermatitis: Secondary | ICD-10-CM | POA: Diagnosis not present

## 2017-02-05 ENCOUNTER — Encounter: Payer: Self-pay | Admitting: Emergency Medicine

## 2017-02-05 ENCOUNTER — Ambulatory Visit (INDEPENDENT_AMBULATORY_CARE_PROVIDER_SITE_OTHER): Payer: PPO | Admitting: Emergency Medicine

## 2017-02-05 VITALS — BP 124/64 | HR 70 | Ht 63.0 in | Wt 150.0 lb

## 2017-02-05 DIAGNOSIS — R2689 Other abnormalities of gait and mobility: Secondary | ICD-10-CM

## 2017-02-05 DIAGNOSIS — J439 Emphysema, unspecified: Secondary | ICD-10-CM | POA: Diagnosis not present

## 2017-02-05 DIAGNOSIS — J301 Allergic rhinitis due to pollen: Secondary | ICD-10-CM | POA: Diagnosis not present

## 2017-02-05 DIAGNOSIS — Z23 Encounter for immunization: Secondary | ICD-10-CM

## 2017-02-05 NOTE — Assessment & Plan Note (Addendum)
Doing well on his current bronchodilator regimen - Stiolto. Albuterol available Flu shot today

## 2017-02-05 NOTE — Patient Instructions (Signed)
Please continue your medications as you are taking them.  Continue to exercise safely.  We will refer you to neurology to evaluate your imbalance.  Follow with Dr Lamonte Sakai in 6 months or sooner if you have any problems

## 2017-02-05 NOTE — Assessment & Plan Note (Signed)
Etiology unclear. Question medications, need to rule out other causes. I will refer him for neurological evaluation and then for follow-up back with Dr [name]

## 2017-02-05 NOTE — Assessment & Plan Note (Signed)
Well controlled at this time. Not clear to me whether this is due to his medical regimen or due to the time of the year. We will continue his current medications

## 2017-02-05 NOTE — Progress Notes (Signed)
  Subjective:    Patient ID: Eduardo [name], male    DOB: January 02, 1931, 83 y.o.   MRN: [id number] HPI 83 yo man, former smoker, CAD s/p MI 1/12, A Fib, allergies, exertional SOB for a few yrs. Also with a history of upper GI bleeding due to AVM. COPD diagnosed 2012, chronic cough.   ROV 81/1/18 -- this follow-up visit for patient with a history of COPD (FEV1 1.03 L or 50% predicted, 09/2010), chronic cough in the setting of upper airway instability and allergic rhinitis. Also with a history of upper GI bleeding due to AVMs. Currently managed on Stiolto. Also using Zyrtec and Atrovent nasal spray as needed. He reports today that he has been doing [place] since last visit. He is having a bit more exertional SOB, has still been working with PT, no desaturations with his exercise documented per him. He is using albuterol neb qhs. He was recently treated with pred and doxy for a possible bronchitis / AE  ROV 81/28/18 -- patient has a history of COPD (FEV1 1.03 L), chronic cough, upper airway instability, allergic rhinitis, coronary disease with atrial fibrillation. He returns today stating that he is feeling fairly well, but is having more problems with balance. His mucous has been better - he is on zyrtec, atrovent nasal spray. He remains on stiolto. Has not needed albuterol, goes to the gym 3x a week.    Review of Systems As per HPI    Objective:  Physical Exam Vitals:   02/05/17 1342  BP: 124/64  Pulse: 70  SpO2: 94%  Weight: 150 lb (68 kg)  Height: 5\' 3"  (1.6 m)   Gen: Pleasant, well-nourished, in no distress,  normal affect  ENT: No lesions,  mouth clear,  oropharynx clear, no postnasal drip  Neck: No JVD, no stridor today  Lungs: No use of accessory muscles, clear B   Cardiovascular: irregular, heart sounds normal, no murmur or gallops, no peripheral edema  Musculoskeletal: No deformities, no cyanosis or clubbing  Neuro: alert, non focal  Skin: Warm, no lesions or  rashes   Assessment & Plan:  COPD (chronic obstructive pulmonary disease) with emphysema (HCC) Doing well on his current bronchodilator regimen - Stiolto. Albuterol available Flu shot today  Allergic rhinitis Well controlled at this time. Not clear to me whether this is due to his medical regimen or due to the time of the year. We will continue his current medications  Imbalance Etiology unclear. Question medications, need to rule out other causes. I will refer him for neurological evaluation and then for follow-up back with Dr Dennie Bible, MD, PhD 02/05/2017, 2:06 PM [facility] [phone] or if no answer [phone]

## 2017-02-19 ENCOUNTER — Telehealth: Payer: Self-pay | Admitting: Emergency Medicine

## 2017-02-19 MED ORDER — IPRATROPIUM BROMIDE 0.03 % NA SOLN: 2.0000 | Freq: Two times a day (BID) | NASAL | 5 refills | Status: DC

## 2017-02-19 NOTE — Telephone Encounter (Signed)
Pt is needing a refill on Ipratropium nasal spray. Rx has been sent in. Pt is aware. Nothing further was needed.

## 2017-03-01 ENCOUNTER — Encounter: Payer: Self-pay | Admitting: Neurology

## 2017-03-29 DIAGNOSIS — F39 Unspecified mood [affective] disorder: Secondary | ICD-10-CM | POA: Diagnosis not present

## 2017-03-29 DIAGNOSIS — F411 Generalized anxiety disorder: Secondary | ICD-10-CM | POA: Diagnosis not present

## 2017-04-08 ENCOUNTER — Encounter: Payer: Self-pay | Admitting: Interventional Cardiology

## 2017-04-09 ENCOUNTER — Ambulatory Visit: Payer: PPO

## 2017-04-10 ENCOUNTER — Encounter: Payer: Self-pay | Admitting: Internal Medicine

## 2017-04-10 ENCOUNTER — Ambulatory Visit (INDEPENDENT_AMBULATORY_CARE_PROVIDER_SITE_OTHER): Payer: PPO | Admitting: Internal Medicine

## 2017-04-10 VITALS — BP 120/74 | HR 76 | Temp 97.7°F | Wt 149.0 lb

## 2017-04-10 DIAGNOSIS — I10 Essential (primary) hypertension: Secondary | ICD-10-CM

## 2017-04-10 NOTE — Patient Instructions (Signed)
No need to check your blood pressure before your exercise. I would only hold off on your regimen if you had symptoms like chest pain, shortness of breath or dizziness.

## 2017-04-10 NOTE — Assessment & Plan Note (Signed)
BP Readings from Last 3 Encounters:  04/10/17 120/74  02/05/17 124/64  12/03/16 134/70   Repeat on right was 138/56  I don't think he needs to check BP before his light exercise program No restrictions unless he has symptoms

## 2017-04-10 NOTE — Progress Notes (Signed)
Subjective:    Patient ID: Eduardo [name], male    DOB: October 02, 1930, 83 y.o.   MRN: [id number]  HPI Here with wife due to elevated BP  Had had elevated BP since last week Works out with therapist at gym regularly---- she takes his BP before starting Highest was 168/97 Typically upper 150's--- and he would work out No problems with his work outs No headache No chest pain No SOB No dizziness or syncope No sig edema  Current Outpatient Prescriptions on File Prior to Visit  Medication Sig Dispense Refill  . acetaminophen (TYLENOL) 650 MG CR tablet Take 1,300 mg by mouth 2 (two) times daily.    Marland Kitchen albuterol (PROAIR HFA) 108 (90 Base) MCG/ACT inhaler Inhale 2 puffs into the lungs every 6 (six) hours as needed for wheezing or shortness of breath. 18 g 0  . albuterol (PROVENTIL) (2.5 MG/3ML) 0.083% nebulizer solution Take 3 mLs (2.5 mg total) by nebulization every 4 (four) hours as needed for wheezing or shortness of breath. 90 vial 1  . apixaban (ELIQUIS) 5 MG TABS tablet Take 1 tablet (5 mg total) by mouth 2 (two) times daily. 180 tablet 3  . atorvastatin (LIPITOR) 80 MG tablet Take 1 tablet (80 mg total) by mouth daily. 90 tablet 2  . azelastine (ASTELIN) 0.1 % nasal spray Place 2 sprays into both nostrils at bedtime. Use in each nostril as directed    . B Complex-C (SUPER B COMPLEX PO) Take 1 tablet by mouth at bedtime.     . cetirizine (ZYRTEC) 10 MG tablet Take 10 mg by mouth at bedtime.     . cholecalciferol (VITAMIN D) 1000 units tablet Take 1,000 Units by mouth daily.    Marland Kitchen escitalopram (LEXAPRO) 20 MG tablet Take 20 mg by mouth at bedtime.     . fenofibrate 160 MG tablet Take 1 tablet (160 mg total) by mouth at bedtime. 90 tablet 3  . furosemide (LASIX) 20 MG tablet Take 1 tablet (20 mg total) by mouth daily. 90 tablet 3  . hydrALAZINE (APRESOLINE) 10 MG tablet Take 1 tablet (10 mg total) by mouth 3 (three) times daily. 270 tablet 3  . ipratropium (ATROVENT) 0.03 % nasal spray  Place 2 sprays into both nostrils every 12 (twelve) hours. 30 mL 5  . lamoTRIgine (LAMICTAL) 150 MG tablet Take 150 mg by mouth at bedtime. Reported on 08/14/2015    . Multiple Vitamin (MULTIVITAMIN) tablet Take 1 tablet by mouth at bedtime.     . pantoprazole (PROTONIX) 40 MG tablet TAKE 1 TABLET (40 MG TOTAL) BY MOUTH DAILY BEFORE BREAKFAST. 90 tablet 3  . STIOLTO RESPIMAT 2.5-2.5 MCG/ACT AERS INHALE 2 PUFFS INTO THE LUNGS DAILY 12 Inhaler 3  . triamcinolone cream (KENALOG) 0.1 % Apply 1 application topically 2 (two) times daily as needed (for skin).     . verapamil (CALAN-SR) 240 MG CR tablet Take 1 tablet (240 mg total) by mouth at bedtime. 90 tablet 3   No current facility-administered medications on file prior to visit.     Allergies  Allergen Reactions  . Penicillins Itching    Has patient had a PCN reaction causing immediate rash, facial/tongue/throat swelling, SOB or lightheadedness with hypotension: Yes Has patient had a PCN reaction causing severe rash involving mucus membranes or skin necrosis: No Has patient had a PCN reaction that required hospitalization No Has patient had a PCN reaction occurring within the last 10 years: No If all of the above answers are "  NO", then may proceed with Cephalosporin use.   . Sulfonamide Derivatives Other (See Comments)    "it affected my kidneys"  . Tape Other (See Comments)    Arms black and blue, tears skin.  Please use "paper" tape    Past Medical History:  Diagnosis Date  . Allergy   . Anemia 2012, 08/2015   chronic. Acute due to large hematoma 2013. 2017: due to gastric AVM bleeding.   . Anxiety   . Atrial fibrillation ([place]) 2012   a.  CHA2DS2VASc = 6-->eliquis;  b. 12/2015 Echo: EF 60-65%, no rwma, LVH, mild AS/MS/MR, sev dil LA, mild TR, PASP 75mmHg.  Marland Kitchen CAD (coronary artery disease)    a. 06/2010 Cath: LM nl, LAD 50p/m, LCX 13m, RCA 50p/m -->catheter dissection but good flow, 70d, RPDA 50-70;  b. 06/2010 Relook cath due to  bradycardia and inf ST elev: RCA dissection @ ostium extending into coronary cusp and prox RCA, Ao dissection-->less staining than previously-->Med Rx.  Marland Kitchen COPD (chronic obstructive pulmonary disease) ([place]) 2013   exertional dyspnea  . Gastric angiodysplasia with hemorrhage 08/2015  . Hx of colonic polyps 2001, 2011   adenomatous 2001. HP 2001, 2011.  Marland Kitchen Hyperlipidemia   . Myocardial infarction ([place]) 06/2010.  12/2011.   "twice; 1 day apart; during/after cath". NQMI in setting severe anemia 2013.   Marland Kitchen Neuropathy 2014   in feet, likely due to spinal stenosis, DDD, HNP  . Osteoarthritis 2002   spinal stenosis, spondylolisthesis, disc protrusion multilevel in lumbar spine  . Stroke ([place]) 2016  . Upper GI bleed 08/2015   EGD: 2 small gastric AVMs, 1 actively bleeding.  Both treated with APC ablation, clipping of the bleeder    Past Surgical History:  Procedure Laterality Date  . APPENDECTOMY    . CARDIAC CATHETERIZATION  2012   50% LAD and luminal irreg in RCA, 1/12  Post cath MI from damage  . CARPAL TUNNEL RELEASE     left  . CARPAL TUNNEL RELEASE  10/11/11   Dr Rush Barer  . CATARACT EXTRACTION W/ INTRAOCULAR LENS IMPLANT  2/13   Dr [name]  . COLONOSCOPY  2001, 02/2010   Dr Deatra Ina.   . ESOPHAGOGASTRODUODENOSCOPY N/A 08/15/2015   [name] MD; 2 small gastric AVMs, 1 actively bleeding.  Both treated with APC ablation, clipping of the bleeder  . FOOT FRACTURE SURGERY     right heel repair  . FRACTURE SURGERY    . KNEE ARTHROSCOPY     left, right knee 10/2009  . MASTOIDECTOMY     x3 on right  . NASAL SEPTUM SURGERY     nasal septal repair  . PENILE PROSTHESIS  REMOVAL    . PENILE PROSTHESIS IMPLANT     x 2--got infection after 2nd and had to remove  . PILONIDAL CYST / SINUS EXCISION    . SHOULDER ARTHROSCOPY     right  . TONSILLECTOMY AND ADENOIDECTOMY      Family History  Problem Relation Age of Onset  . Cancer Mother        ? leukemia  . Heart attack Father     . Diabetes Neg Hx   . Hypertension Neg Hx     Social History   Social History  . Marital status: Married    Spouse name: N/A  . Number of children: 4  . Years of education: N/A   Occupational History  . Retired Chief Strategy Officer the job trainin)    Social History Main  Topics  . Smoking status: Former Smoker    Packs/day: 2.50    Years: 30.00    Types: Cigarettes    Quit date: 06/11/1978  . Smokeless tobacco: Never Used  . Alcohol use No     Comment: [id number] [id number] alcoholic  . Drug use: No  . Sexual activity: Not Currently   Other Topics Concern  . Not on file   Social History Narrative   Grew up in [place], [place].  Lives in [place].   Married---2nd   2 children ([place], [place]  and near [place])--2 stepchildren   Former Smoker--quit in 1980   Alcohol use-no. Quit in 1980--alcoholic      Has living will.   Has designated wife, then step daughter [name] have health care POA.   Would accept resuscitation attempts.   No feeding tube if cognitively unaware.   Review of Systems  Sleeps well Appetite is good     Objective:   Physical Exam  Constitutional: He appears well-developed. No distress.  Neck: No thyromegaly present.  Cardiovascular: Normal rate, regular rhythm and normal heart sounds.  Exam reveals no gallop.   No murmur heard. Pulmonary/Chest: Effort normal and breath sounds normal. No respiratory distress. He has no wheezes. He has no rales.  Musculoskeletal: He exhibits no edema.  Lymphadenopathy:    He has no cervical adenopathy.          Assessment & Plan:

## 2017-05-12 ENCOUNTER — Other Ambulatory Visit: Payer: Self-pay | Admitting: Emergency Medicine

## 2017-05-15 ENCOUNTER — Encounter: Payer: Self-pay | Admitting: Interventional Cardiology

## 2017-05-15 ENCOUNTER — Ambulatory Visit: Payer: PPO | Admitting: Interventional Cardiology

## 2017-05-15 VITALS — BP 150/78 | HR 78 | Ht 63.0 in | Wt 150.6 lb

## 2017-05-15 DIAGNOSIS — I2510 Atherosclerotic heart disease of native coronary artery without angina pectoris: Secondary | ICD-10-CM | POA: Diagnosis not present

## 2017-05-15 DIAGNOSIS — E785 Hyperlipidemia, unspecified: Secondary | ICD-10-CM | POA: Diagnosis not present

## 2017-05-15 DIAGNOSIS — J439 Emphysema, unspecified: Secondary | ICD-10-CM | POA: Diagnosis not present

## 2017-05-15 DIAGNOSIS — I4891 Unspecified atrial fibrillation: Secondary | ICD-10-CM

## 2017-05-15 DIAGNOSIS — Z7901 Long term (current) use of anticoagulants: Secondary | ICD-10-CM | POA: Diagnosis not present

## 2017-05-15 DIAGNOSIS — I10 Essential (primary) hypertension: Secondary | ICD-10-CM

## 2017-05-15 DIAGNOSIS — I5032 Chronic diastolic (congestive) heart failure: Secondary | ICD-10-CM

## 2017-05-15 NOTE — Patient Instructions (Signed)

## 2017-05-15 NOTE — Progress Notes (Signed)
Cardiology Office Note    Date:  05/15/2017   ID:  Eduardo [name], DOB 07/06/1930, MRN [id number]  PCP:  Venia Carbon, MD  Cardiologist: Sinclair Grooms, MD   Chief Complaint  Patient presents with  . Atrial Fibrillation    History of Present Illness:  Eduardo [name] is a 83 y.o. male with a history of CAD (cath 2012 c/b dissection of RCA with ST elevation/temp pacer, medically managed), chronic atrial fib on Eliquis, chronic respiratory failure/COPD, HLD and CVA    Accompanied by his wife.  He is doing well as angina.  No nitroglycerin use..  Denies orthopnea and edema.  He has not had syncope.  There is no peripheral edema.  No blood in the urine or stool.  Though his balance is difficult he has not fallen or had head trauma.   Past Medical History:  Diagnosis Date  . Allergy   . Anemia 2012, 08/2015   chronic. Acute due to large hematoma 2013. 2017: due to gastric AVM bleeding.   . Anxiety   . Atrial fibrillation ([place]) 2012   a.  CHA2DS2VASc = 6-->eliquis;  b. 12/2015 Echo: EF 60-65%, no rwma, LVH, mild AS/MS/MR, sev dil LA, mild TR, PASP 51mmHg.  Marland Kitchen CAD (coronary artery disease)    a. 06/2010 Cath: LM nl, LAD 50p/m, LCX 65m, RCA 50p/m -->catheter dissection but good flow, 70d, RPDA 50-70;  b. 06/2010 Relook cath due to bradycardia and inf ST elev: RCA dissection @ ostium extending into coronary cusp and prox RCA, Ao dissection-->less staining than previously-->Med Rx.  Marland Kitchen COPD (chronic obstructive pulmonary disease) ([place]) 2013   exertional dyspnea  . Gastric angiodysplasia with hemorrhage 08/2015  . Hx of colonic polyps 2001, 2011   adenomatous 2001. HP 2001, 2011.  Marland Kitchen Hyperlipidemia   . Myocardial infarction ([place]) 06/2010.  12/2011.   "twice; 1 day apart; during/after cath". NQMI in setting severe anemia 2013.   Marland Kitchen Neuropathy 2014   in feet, likely due to spinal stenosis, DDD, HNP  . Osteoarthritis 2002   spinal stenosis, spondylolisthesis, disc protrusion  multilevel in lumbar spine  . Stroke ([place]) 2016  . Upper GI bleed 08/2015   EGD: 2 small gastric AVMs, 1 actively bleeding.  Both treated with APC ablation, clipping of the bleeder    Past Surgical History:  Procedure Laterality Date  . APPENDECTOMY    . CARDIAC CATHETERIZATION  2012   50% LAD and luminal irreg in RCA, 1/12  Post cath MI from damage  . CARPAL TUNNEL RELEASE     left  . CARPAL TUNNEL RELEASE  10/11/11   Dr Rush Barer  . CATARACT EXTRACTION W/ INTRAOCULAR LENS IMPLANT  2/13   Dr [name]  . COLONOSCOPY  2001, 02/2010   Dr Deatra Ina.   . ESOPHAGOGASTRODUODENOSCOPY N/A 08/15/2015   [name] MD; 2 small gastric AVMs, 1 actively bleeding.  Both treated with APC ablation, clipping of the bleeder  . FOOT FRACTURE SURGERY     right heel repair  . FRACTURE SURGERY    . KNEE ARTHROSCOPY     left, right knee 10/2009  . MASTOIDECTOMY     x3 on right  . NASAL SEPTUM SURGERY     nasal septal repair  . PENILE PROSTHESIS  REMOVAL    . PENILE PROSTHESIS IMPLANT     x 2--got infection after 2nd and had to remove  . PILONIDAL CYST / SINUS EXCISION    . SHOULDER ARTHROSCOPY  right  . TONSILLECTOMY AND ADENOIDECTOMY      Current Medications: Outpatient Medications Prior to Visit  Medication Sig Dispense Refill  . acetaminophen (TYLENOL) 650 MG CR tablet Take 1,300 mg by mouth 2 (two) times daily.    Marland Kitchen albuterol (PROAIR HFA) 108 (90 Base) MCG/ACT inhaler Inhale 2 puffs into the lungs every 6 (six) hours as needed for wheezing or shortness of breath. 18 g 0  . albuterol (PROVENTIL) (2.5 MG/3ML) 0.083% nebulizer solution Take 3 mLs (2.5 mg total) by nebulization every 4 (four) hours as needed for wheezing or shortness of breath. 90 vial 1  . apixaban (ELIQUIS) 5 MG TABS tablet Take 1 tablet (5 mg total) by mouth 2 (two) times daily. 180 tablet 3  . atorvastatin (LIPITOR) 80 MG tablet Take 1 tablet (80 mg total) by mouth daily. 90 tablet 2  . azelastine (ASTELIN) 0.1 %  nasal spray Place 2 sprays into both nostrils at bedtime. Use in each nostril as directed    . B Complex-C (SUPER B COMPLEX PO) Take 1 tablet by mouth at bedtime.     . cetirizine (ZYRTEC) 10 MG tablet Take 10 mg by mouth at bedtime.     . cholecalciferol (VITAMIN D) 1000 units tablet Take 1,000 Units by mouth daily.    Marland Kitchen escitalopram (LEXAPRO) 20 MG tablet Take 20 mg by mouth at bedtime.     . fenofibrate 160 MG tablet Take 1 tablet (160 mg total) by mouth at bedtime. 90 tablet 3  . furosemide (LASIX) 20 MG tablet Take 1 tablet (20 mg total) by mouth daily. 90 tablet 3  . hydrALAZINE (APRESOLINE) 10 MG tablet Take 1 tablet (10 mg total) by mouth 3 (three) times daily. 270 tablet 3  . ipratropium (ATROVENT) 0.03 % nasal spray PLACE 2 SPRAYS INTO BOTH NOSTRILS AT BEDTIME. 30 mL 4  . lamoTRIgine (LAMICTAL) 150 MG tablet Take 150 mg by mouth at bedtime. Reported on 08/14/2015    . Multiple Vitamin (MULTIVITAMIN) tablet Take 1 tablet by mouth at bedtime.     . pantoprazole (PROTONIX) 40 MG tablet TAKE 1 TABLET (40 MG TOTAL) BY MOUTH DAILY BEFORE BREAKFAST. 90 tablet 3  . STIOLTO RESPIMAT 2.5-2.5 MCG/ACT AERS INHALE 2 PUFFS INTO THE LUNGS DAILY 12 Inhaler 3  . triamcinolone cream (KENALOG) 0.1 % Apply 1 application topically 2 (two) times daily as needed (for skin).     . Turmeric 500 MG TABS Take 2 tablets by mouth.    . verapamil (CALAN-SR) 240 MG CR tablet Take 1 tablet (240 mg total) by mouth at bedtime. 90 tablet 3   No facility-administered medications prior to visit.      Allergies:   Penicillins; Sulfonamide derivatives; and Tape   Social History   Socioeconomic History  . Marital status: Married    Spouse name: None  . Number of children: 4  . Years of education: None  . Highest education level: None  Social Needs  . Financial resource strain: None  . Food insecurity - worry: None  . Food insecurity - inability: None  . Transportation needs - medical: None  . Transportation needs  - non-medical: None  Occupational History  . Occupation: Retired Chief Strategy Officer the job trainin)  Tobacco Use  . Smoking status: Former Smoker    Packs/day: 2.50    Years: 30.00    Pack years: 75.00    Types: Cigarettes    Last attempt to quit: 06/11/1978    Years since quitting:  38.9  . Smokeless tobacco: Never Used  Substance and Sexual Activity  . Alcohol use: No    Comment: QUIT [id number] alcoholic  . Drug use: No  . Sexual activity: Not Currently  Other Topics Concern  . None  Social History Narrative   Grew up in [place], [place].  Lives in [place].   Married---2nd   2 children ([place], [place]  and near [place])--2 stepchildren   Former Smoker--quit in 1980   Alcohol use-no. Quit in 1980--alcoholic      Has living will.   Has designated wife, then step daughter [name] have health care POA.   Would accept resuscitation attempts.   No feeding tube if cognitively unaware.     Family History:  The patient's family history includes Cancer in his mother; Heart attack in his father.   ROS:   Please see the history of present illness.    He is concerned he has to take a nap daily.  He sleeps well at night.  That back shortness of breath on exertion, leg swelling, irregular heartbeat.  He is considering having surgery for spinal stenosis. All other systems reviewed and are negative.   PHYSICAL EXAM:   VS:  BP (!) 150/78   Pulse 78   Ht 5\' 3"  (1.6 m)   Wt 150 lb 9.6 oz (68.3 kg)   BMI 26.68 kg/m    GEN: Well nourished, well developed, in no acute distress  HEENT: normal  Neck: no JVD, carotid bruits, or masses Cardiac: IIRR; no murmurs, rubs, or gallops,no edema  Respiratory:  clear to auscultation bilaterally, normal work of breathing GI: soft, nontender, nondistended, + BS MS: no deformity or atrophy  Skin: warm and dry, no rash Neuro:  Alert and Oriented x 3, Strength and sensation are intact Psych: euthymic mood, full affect  Wt Readings from Last 3  Encounters:  05/15/17 150 lb 9.6 oz (68.3 kg)  04/10/17 149 lb (67.6 kg)  02/05/17 150 lb (68 kg)      Studies/Labs Reviewed:   EKG:  EKG atrial fibrillation, interventricular conduction delay, controlled rate of 78 bpm, ST-T wave abnormality secondary to conduction.  Recent Labs: 07/13/2016: B Natriuretic Peptide 261.9 07/18/2016: ALT 47; Magnesium 2.5; TSH 0.133 07/24/2016: Hemoglobin 9.9; Platelets 341.0 07/30/2016: NT-Pro BNP 1,562 08/08/2016: BUN 25; Creatinine, Ser 0.99; Potassium 4.1; Sodium 143   Lipid Panel    Component Value Date/Time   CHOL 134 05/18/2016 0810   TRIG (H) 05/18/2016 0810    952.0 Triglyceride is over 400; calculations on Lipids are invalid.   HDL 42.80 05/18/2016 0810   CHOLHDL 3 05/18/2016 0810   VLDL UNABLE TO CALCULATE IF TRIGLYCERIDE OVER 400 mg/dL 12/31/2014 0510   LDLCALC UNABLE TO CALCULATE IF TRIGLYCERIDE OVER 400 mg/dL 12/31/2014 0510   LDLDIRECT 65.0 05/18/2016 0810    Additional studies/ records that were reviewed today include:  None    ASSESSMENT:    1. Atrial fibrillation with rapid ventricular response ([place])   2. Chronic diastolic HF (heart failure) ([place])   3. Coronary artery disease involving native coronary artery of native heart without angina pectoris   4. Essential hypertension   5. Pulmonary emphysema, unspecified emphysema type ([place])   6. Dyslipidemia   7. Chronic anticoagulation      PLAN:  In order of problems listed above:  1. Controlled rate.  On anticoagulation therapy.  This problem is stable 2. There is no clinical evidence of volume overload.  He is not having angina  3. Having or dyspnea to suggest clinical ischemia.  Plan clinical follow-up without functional testing. 4. Chronic hypertension is well controlled on current medical regimen.   5. Chronic dyspnea on exertion. 6. Not addressed 7. Eliquis therapy without significant bleeding.  The wanted to discuss whether he would ever be a surgical candidate for  spinal stenosis relief.  I will cleared for surgery but did make them aware that his designation would be higher risk.  This generally means that there is greater than a 3-4% chance that he will have significant cardiac complication.  Plan clinical follow-up in 1 year.    Medication Adjustments/Labs and Tests Ordered: Current medicines are reviewed at length with the patient today.  Concerns regarding medicines are outlined above.  Medication changes, Labs and Tests ordered today are listed in the Patient Instructions below. Patient Instructions  Medication Instructions:  Your physician recommends that you continue on your current medications as directed. Please refer to the Current Medication list given to you today.  Labwork: None  Testing/Procedures: None  Follow-Up: Your physician wants you to follow-up in: 1 year with Dr. Tamala Julian.  You will receive a reminder letter in the mail two months in advance. If you don't receive a letter, please call our office to schedule the follow-up appointment.   Any Other Special Instructions Will Be Listed Below (If Applicable).     If you need a refill on your cardiac medications before your next appointment, please call your pharmacy.      Signed, Sinclair Grooms, MD  05/15/2017 4:28 PM    [facility] [address], [place], [place]  [address] Phone: [phone]; Fax: [phone]

## 2017-05-31 ENCOUNTER — Encounter: Payer: PPO | Admitting: Internal Medicine

## 2017-06-03 ENCOUNTER — Other Ambulatory Visit: Payer: Self-pay | Admitting: Interventional Cardiology

## 2017-06-07 ENCOUNTER — Other Ambulatory Visit (INDEPENDENT_AMBULATORY_CARE_PROVIDER_SITE_OTHER): Payer: PPO

## 2017-06-07 ENCOUNTER — Ambulatory Visit: Payer: PPO | Admitting: Neurology

## 2017-06-07 ENCOUNTER — Encounter: Payer: Self-pay | Admitting: Neurology

## 2017-06-07 ENCOUNTER — Other Ambulatory Visit: Payer: PPO

## 2017-06-07 VITALS — BP 120/70 | HR 68 | Ht 63.0 in | Wt 148.5 lb

## 2017-06-07 DIAGNOSIS — R278 Other lack of coordination: Secondary | ICD-10-CM | POA: Diagnosis not present

## 2017-06-07 DIAGNOSIS — G629 Polyneuropathy, unspecified: Secondary | ICD-10-CM | POA: Diagnosis not present

## 2017-06-07 LAB — FOLATE

## 2017-06-07 LAB — VITAMIN B12: Vitamin B-12: 848 pg/mL (ref 211–911)

## 2017-06-07 LAB — TSH: TSH: 2.07 u[IU]/mL (ref 0.35–4.50)

## 2017-06-07 NOTE — Progress Notes (Signed)
[facility] Clinic Note - Initial Visit   Date: 06/07/17  Eduardo [name] MRN: [id number] DOB: [birth year]   Dear Dr. Lamonte Sakai:  Thank you for your kind referral of Eduardo [name] for consultation of imbalance. Although his history is well known to you, please allow [place] to reiterate it for the purpose of our medical record. The patient was accompanied to the clinic by wife who also provides collateral information.     History of Present Illness: Eduardo [name] is a 83 y.o. right-handed Caucasian male with atrial fibrillation, CAD, COPD, hyperlipidemia, stroke, and depression presenting for evaluation of imbalance.  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  Over the past 3 years, he has started having increased falls because of imbalance.  It is worse with with abrupt movements and positional changes.  He has been using a cane since 2015 and uses a walker for the past 6 months.  He goes to the gym 2-3 times per week.  He endorses numbness and reduced sensation of the feet for several years.  There is no tingling or burning pain.  He has suffered one fall in the past year.  Prior to this, he suffered a fall in developed large hematoma over his flank for which his anticoagulation was briefly stopped (2016).  During this time, he had small TIA manifesting with right hand weakness and recovered.  Because of this, his anticoagulation was restarted.  He has chronic low back pain for the past year, described as achy and nonradiating.  He denies any radicular pain into the legs. No bowel of bladder complaints. MRI lumbar spine shows multilevel degenerative changes with spinal stenosis and foraminal stenosis.   He was drinking heavily for about 5 years and went to [place] and quit in 1980. No history of diabetes or family history of neuropathy.   Out-side paper records, electronic medical record, and images have been reviewed where available and summarized as:  MRI lumbar  spine 01/20/2015: 1. At L2-3 there is a moderate broad-based disc bulge. Bilateral mild facet arthropathy with ligamentum flavum infolding. Severe spinal stenosis. Moderate left foraminal stenosis. 2. At L3-4 there is a moderate broad-based disc bulge and bilateral severe facet arthropathy resulting in severe spinal stenosis and bilateral lateral recess stenosis. Moderate right and mild left foraminal stenosis. 3. At L4-5 there is a mild broad-based disc bulge. Severe bilateral facet arthropathy and ligamentum flavum infolding resulting in moderate spinal stenosis and bilateral lateral recess stenosis. Mild bilateral foraminal stenosis.  MRI/A brain 12/31/2014: 1. Small acute ischemic nonhemorrhagic 10 x 9 x 12 mm cortical infarct involving the posterior left frontal lobe in the left precentral gyrus. 2. Additional tiny acute ischemic nonhemorrhagic 5 mm cortical infarct within the left occipital lobe. 3. Advanced age-related cerebral atrophy with mild chronic microvascular ischemic disease.  MRA HEAD IMPRESSION: 1. No proximal or large vessel occlusion identified within the intracranial circulation. 2. No correctable or focal hemosiderin and likely significant stenosis identified. 3. Diminutive left A2 segment with moderate to advanced multi focal atheromatous irregularity. 4. Distal branch atheromatous irregularity within the MCA branches bilaterally.  Lab Results  Component Value Date   TSH 2.07 06/07/2017   Lab Results  Component Value Date   [id number] 053 06/07/2017     Past Medical History:  Diagnosis Date  . Allergy   . Anemia 2012, 08/2015   chronic. Acute due to large hematoma 2013. 2017: due to gastric AVM bleeding.   . Anxiety   . Atrial  fibrillation ([place]) 2012   a.  CHA2DS2VASc = 6-->eliquis;  b. 12/2015 Echo: EF 60-65%, no rwma, LVH, mild AS/MS/MR, sev dil LA, mild TR, PASP 53mmHg.  Marland Kitchen CAD (coronary artery disease)    a. 06/2010 Cath: LM nl, LAD 50p/m, LCX 72m, RCA  50p/m -->catheter dissection but good flow, 70d, RPDA 50-70;  b. 06/2010 Relook cath due to bradycardia and inf ST elev: RCA dissection @ ostium extending into coronary cusp and prox RCA, Ao dissection-->less staining than previously-->Med Rx.  Marland Kitchen COPD (chronic obstructive pulmonary disease) ([place]) 2013   exertional dyspnea  . Gastric angiodysplasia with hemorrhage 08/2015  . Hx of colonic polyps 2001, 2011   adenomatous 2001. HP 2001, 2011.  Marland Kitchen Hyperlipidemia   . Myocardial infarction ([place]) 06/2010.  12/2011.   "twice; 1 day apart; during/after cath". NQMI in setting severe anemia 2013.   Marland Kitchen Neuropathy 2014   in feet, likely due to spinal stenosis, DDD, HNP  . Osteoarthritis 2002   spinal stenosis, spondylolisthesis, disc protrusion multilevel in lumbar spine  . Stroke ([place]) 2016  . Upper GI bleed 08/2015   EGD: 2 small gastric AVMs, 1 actively bleeding.  Both treated with APC ablation, clipping of the bleeder    Past Surgical History:  Procedure Laterality Date  . APPENDECTOMY    . CARDIAC CATHETERIZATION  2012   50% LAD and luminal irreg in RCA, 1/12  Post cath MI from damage  . CARPAL TUNNEL RELEASE     left  . CARPAL TUNNEL RELEASE  10/11/11   Dr Rush Barer  . CATARACT EXTRACTION W/ INTRAOCULAR LENS IMPLANT  2/13   Dr [name]  . COLONOSCOPY  2001, 02/2010   Dr Deatra Ina.   . ESOPHAGOGASTRODUODENOSCOPY N/A 08/15/2015   [name] MD; 2 small gastric AVMs, 1 actively bleeding.  Both treated with APC ablation, clipping of the bleeder  . FOOT FRACTURE SURGERY     right heel repair  . FRACTURE SURGERY    . KNEE ARTHROSCOPY     left, right knee 10/2009  . MASTOIDECTOMY     x3 on right  . NASAL SEPTUM SURGERY     nasal septal repair  . PENILE PROSTHESIS  REMOVAL    . PENILE PROSTHESIS IMPLANT     x 2--got infection after 2nd and had to remove  . PILONIDAL CYST / SINUS EXCISION    . SHOULDER ARTHROSCOPY     right  . TONSILLECTOMY AND ADENOIDECTOMY       Medications:    Outpatient Encounter Medications as of 06/07/2017  Medication Sig  . acetaminophen (TYLENOL) 650 MG CR tablet Take 1,300 mg by mouth 2 (two) times daily.  Marland Kitchen albuterol (PROAIR HFA) 108 (90 Base) MCG/ACT inhaler Inhale 2 puffs into the lungs every 6 (six) hours as needed for wheezing or shortness of breath.  Marland Kitchen albuterol (PROVENTIL) (2.5 MG/3ML) 0.083% nebulizer solution Take 3 mLs (2.5 mg total) by nebulization every 4 (four) hours as needed for wheezing or shortness of breath.  Marland Kitchen apixaban (ELIQUIS) 5 MG TABS tablet Take 1 tablet (5 mg total) by mouth 2 (two) times daily.  Marland Kitchen atorvastatin (LIPITOR) 80 MG tablet TAKE ONE TABLET BY MOUTH ONE TIME DAILY   . azelastine (ASTELIN) 0.1 % nasal spray Place 2 sprays into both nostrils at bedtime. Use in each nostril as directed  . B Complex-C (SUPER B COMPLEX PO) Take 1 tablet by mouth at bedtime.   . cetirizine (ZYRTEC) 10 MG tablet Take 10 mg by mouth  at bedtime.   . cholecalciferol (VITAMIN D) 1000 units tablet Take 1,000 Units by mouth daily.  Marland Kitchen escitalopram (LEXAPRO) 20 MG tablet Take 20 mg by mouth at bedtime.   . fenofibrate 160 MG tablet Take 1 tablet (160 mg total) by mouth at bedtime.  . furosemide (LASIX) 20 MG tablet Take 1 tablet (20 mg total) by mouth daily.  . hydrALAZINE (APRESOLINE) 10 MG tablet Take 1 tablet (10 mg total) by mouth 3 (three) times daily.  Marland Kitchen ipratropium (ATROVENT) 0.03 % nasal spray PLACE 2 SPRAYS INTO BOTH NOSTRILS AT BEDTIME.  Marland Kitchen lamoTRIgine (LAMICTAL) 150 MG tablet Take 150 mg by mouth at bedtime. Reported on 08/14/2015  . Multiple Vitamin (MULTIVITAMIN) tablet Take 1 tablet by mouth at bedtime.   . pantoprazole (PROTONIX) 40 MG tablet TAKE 1 TABLET (40 MG TOTAL) BY MOUTH DAILY BEFORE BREAKFAST.  Marland Kitchen STIOLTO RESPIMAT 2.5-2.5 MCG/ACT AERS INHALE 2 PUFFS INTO THE LUNGS DAILY  . triamcinolone cream (KENALOG) 0.1 % Apply 1 application topically 2 (two) times daily as needed (for skin).   . Turmeric 500 MG TABS Take 2 tablets by  mouth.  . verapamil (CALAN-SR) 240 MG CR tablet Take 1 tablet (240 mg total) by mouth at bedtime.   No facility-administered encounter medications on file as of 06/07/2017.      Allergies:  Allergies  Allergen Reactions  . Penicillins Itching    Has patient had a PCN reaction causing immediate rash, facial/tongue/throat swelling, SOB or lightheadedness with hypotension: Yes Has patient had a PCN reaction causing severe rash involving mucus membranes or skin necrosis: No Has patient had a PCN reaction that required hospitalization No Has patient had a PCN reaction occurring within the last 10 years: No If all of the above answers are "NO", then may proceed with Cephalosporin use.   . Sulfonamide Derivatives Other (See Comments)    "it affected my kidneys"  . Tape Other (See Comments)    Arms black and blue, tears skin.  Please use "paper" tape    Family History: Family History  Problem Relation Age of Onset  . Cancer Mother        ? leukemia  . Heart attack Father   . Stroke Maternal Grandmother   . Diabetes Neg Hx   . Hypertension Neg Hx     Social History: Social History   Tobacco Use  . Smoking status: Former Smoker    Packs/day: 2.50    Years: 30.00    Pack years: 75.00    Types: Cigarettes    Last attempt to quit: 06/11/1978    Years since quitting: 39.0  . Smokeless tobacco: Never Used  Substance Use Topics  . Alcohol use: No    Comment: QUIT [id number] alcoholic  . Drug use: No   Social History   Social History Narrative   Grew up in [place], [place].  Lives in [place].   Married---2nd   2 children ([place], [place]  and near [place])--2 stepchildren   Former Smoker--quit in 1980   Alcohol use-no. Quit in 1980--alcoholic      Has living will.   Has designated wife, then step daughter [name] have health care POA.   Would accept resuscitation attempts.   No feeding tube if cognitively unaware.    Review of Systems:  CONSTITUTIONAL: No fevers,  chills, night sweats, or weight loss.   EYES: No visual changes or eye pain ENT: No hearing changes.  No history of nose bleeds.   RESPIRATORY: No  cough, wheezing and shortness of breath.   CARDIOVASCULAR: Negative for chest pain, and palpitations.   GI: Negative for abdominal discomfort, blood in stools or black stools.  No recent change in bowel habits.   GU:  No history of incontinence.   MUSCLOSKELETAL: +history of joint pain or swelling.  No myalgias.   SKIN: Negative for lesions, rash, and itching.   HEMATOLOGY/ONCOLOGY: Negative for prolonged bleeding, bruising easily, and swollen nodes.  No history of cancer.   ENDOCRINE: Negative for cold or heat intolerance, polydipsia or goiter.   PSYCH:  No depression or anxiety symptoms.   NEURO: As Above.   Vital Signs:  BP 120/70   Pulse 68   Ht 5\' 3"  (1.6 m)   Wt 148 lb 8 oz (67.4 kg)   SpO2 98%   BMI 26.31 kg/m    General Medical Exam:   General:  Well appearing, comfortable.   Eyes/ENT: see cranial nerve examination.   Neck: No masses appreciated.  Full range of motion without tenderness.  No carotid bruits. Respiratory:  Clear to auscultation, good air entry bilaterally.   Cardiac:  Irregularly irregular rate and rhythm, no murmur.   Extremities:  No deformities, edema, or skin discoloration.  Skin:  No rashes or lesions.  Neurological Exam: MENTAL STATUS including orientation to time, place, person, recent and remote memory, attention span and concentration, language, and fund of knowledge is normal.  Speech is not dysarthric.  CRANIAL NERVES: II:  No visual field defects.  Unremarkable fundi.   III-IV-VI: Pupils equal round and reactive to light.  Normal conjugate, extra-ocular eye movements in all directions of gaze.  No nystagmus.  No ptosis.   V:  Normal facial sensation.    VII:  Normal facial symmetry and movements.  VIII:  Normal hearing and vestibular function.   IX-X:  Normal palatal movement.   XI:  Normal  shoulder shrug and head rotation.   XII:  Normal tongue strength and range of motion, no deviation or fasciculation.  MOTOR:  No atrophy, fasciculations or abnormal movements.  No pronator drift.  Tone is normal.    Right Upper Extremity:    Left Upper Extremity:    Deltoid  5/5   Deltoid  5/5   Biceps  5/5   Biceps  5/5   Triceps  5/5   Triceps  5/5   Wrist extensors  5/5   Wrist extensors  5/5   Wrist flexors  5/5   Wrist flexors  5/5   Finger extensors  5/5   Finger extensors  5/5   Finger flexors  5/5   Finger flexors  5/5   Dorsal interossei  5/5   Dorsal interossei  5/5   Abductor pollicis  5/5   Abductor pollicis  5/5   Tone (Ashworth scale)  0  Tone (Ashworth scale)  0   Right Lower Extremity:    Left Lower Extremity:    Hip flexors  5/5   Hip flexors  5/5   Hip extensors  5/5   Hip extensors  5/5   Knee flexors  5/5   Knee flexors  5/5   Knee extensors  5/5   Knee extensors  5/5   Dorsiflexors  5-/5   Dorsiflexors  5-/5   Plantarflexors  5-/5   Plantarflexors  5-/5   Toe extensors  4/5   Toe extensors  4/5   Toe flexors  4/5   Toe flexors  4/5   Tone (Ashworth scale)  0  Tone (Ashworth scale)  0   MSRs:  Right                                                                 Left brachioradialis 2+  brachioradialis 2+  biceps 2+  biceps 2+  triceps 2+  triceps 2+  patellar 0  Patellar 0  ankle jerk 0  ankle jerk 0  Hoffman no  Hoffman no  plantar response down  plantar response down   SENSORY:  Absent vibration and temperature distal to ankles bilaterally, proprioception is impaired at the great toe.  Pin prick causes hyperesthesia in the feet.  Sensation in the hands is intact.  Rhomberg sign is positive.  COORDINATION/GAIT: Normal finger-to- nose-finger.  Intact rapid alternating movements bilaterally. Gait is slightly wide-based, unsteady, unassisted. He is unable to perform tandem or stressed gait.   IMPRESSION: Mr. Hodsdon is a 83 year-old man referred for  evaluation of imbalance.  His neurological examination shows a distal predominant large fiber peripheral neuropathy. I had extensive discussion with the patient regarding the pathogenesis, etiology, management, and natural course of neuropathy. Neuropathy tends to be slowly progressive, especially if a treatable etiology is not identified.  I would like to test for treatable causes of neuropathy. I discussed that in the vast majority of cases, despite checking for reversible causes, we are unable to find the underlying etiology and management is symptomatic.  He has history of alcohol use, but has been sober since 28s.  No diabetes or family history of neuropathy.  Most likely, this is idiopathic neuropathy.  I had a lengthy discussion with patient regarding his high fall risk and being on anticoagulation therapy.  He is aware of the risk of a devastating intracranial bleed, if he were to have a severe fall on anticoagulation, as well as the potential for stroke, if he were to stop it.  Of note, he had TIA while off anticoagulation in 2016.  He has only suffered one fall in the past year, so it may be ok to continue this for now as long as he is compliant with using a rollator.  I will also start him in balance PT program.  I have stressed the importance of fall precautions.    PLAN/RECOMMENDATIONS:  1.  Check vitamin B12, vitamin B1, TSH, folate, copper, SPEP with IFE 2.  Patient educated on fall precautions and advised to use walker/rollator at all times.  His cane does not provide enough support and I discouraged him from using this.  Literature provided on fall precautions and home safety 3.  If balance gets worse, anticoagulation therapy may come with more risks than benefits in this individual it would be a conversation patient will have to have with this cardiologist, Dr. Daneen Schick, if it is reasonable to continue.     Thank you for allowing me to participate in patient's care.  If I can answer  any additional questions, I would be pleased to do so.    Sincerely,    Donika K. Posey Pronto, DO

## 2017-06-07 NOTE — Patient Instructions (Signed)
We will check labs  Start working on balance training exercises with your physical therapist  If you notice that your balance is getting worse, please discuss with your cardiologist about whether you need to stay on anticoagulation therapy.  [facility]  Preventing Falls in the Home   Falls are common, often dreaded events in the lives of older people. Aside from the obvious injuries and even death that may result, falls can cause wide-ranging consequences including loss of independence, mental decline, decreased activity, and mobility. Younger people are also at risk of falling, especially those with chronic illnesses and fatigue.  Ways to reduce the risk for falling:  * Examine diet and medications. Warm foods and alcohol dilate blood vessels, which can lead to dizziness when standing. Sleep aids, antidepressants, and pain medications can also increase the likelihood of a fall.  * Get a vison exam. Poor vision, cataracts, and glaucoma increase the chances of falling.  * Check foot gear. Shoes should fit snugly and have a sturdy, nonskid sole and broad, low heel.  * Participate in a physician-approved exercise program to build and maintain muscle strength and improve balance and coordination.  * Increase vitamin D intake. Vitamin D improves muscle strength and increases the amount of calcium the body is able to absorb and deposit in bones.  How to prevent falls from common hazards:  * Floors - Remove all loose wires, cords, and throw rugs. Minimize clutter. Make sure rugs are anchored and smooth. Keep furniture in its usual place.  * Chairs - Use chairs with straight backs, armrests, and firm seats. Add firm cushions to existing pieces to add height.  * Bathroom - Install grab bars and non-skid tape in the tub or shower. Use a bathtub transfer bench or a shower chair with a back support. Use an elevated toilet seat and/or safety rails to assist standing from a low surface. Do not use towel  racks or bathroom tissue holders to help you stand.  * Lighting - Make sure halls, stairways, and entrances are well-lit. Install a night light in your bathroom or hallway. Make sure there is a light switch at the top and bottom of the staircase. Turn lights on if you get up in the middle of the night. Make sure lamps or light switches are within reach of the bed if you have to get up during the night.  * Kitchen - Install non-skid rubber mats near the sink and stove. Clean spills immediately. Store frequently used utensils, pots, and pans between waist and eye level. This helps prevent reaching and bending. Sit when getting things out of the lower cupboards.  * Living room / [place] furniture with wide spaces in between, giving enough room to move around. Establish a route through the living room that gives you something to hold onto as you walk.  * Stairs - Make sure treads, rails, and rugs are secure. Install a rail on both sides of the stairs. If stairs are a threat, it might be helpful to arrange most of your activities on the lower level to reduce the number of times you must climb the stairs.  * Entrances and doorways - Install metal handles on the walls adjacent to the doorknobs of all doors to make it more secure as you travel through the doorway.  Tips for maintaining balance:   Never carry objects in both hands when walking as this interferes with keeping your balance.  * Attempt to swing both arms  from front to back while walking. This might require a conscious effort if Parkinson's disease has diminished your movement. It will, however, help you to maintain balance and posture, and reduce fatigue.  * Consciously lift your feet off the ground when walking. Shuffling and dragging of the feet is a common culprit in losing your balance.  * When trying to navigate turns, use a "U" technique of facing forward and making a wide turn, rather than pivoting sharply.  * Try to stand with your  feet shoulder-length apart. When your feet are close together for any length of time, you increase your risk of losing your balance and falling.  * Do one thing at a time. Do not try to walk and accomplish another task, such as reading or looking around. The decrease in your automatic reflexes complicates motor function, so the less distraction, the better.  * Do not wear rubber or gripping soled shoes, they might "catch" on the floor and cause tripping.  * Move slowly when changing positions. Use deliberate, concentrated movements and, if needed, use a grab bar or walking aid. Count fifteen (15) seconds after standing to begin walking.  * If balance is a continuous problem, you might want to consider a walking aid such as a cane, walking stick, or walker. Once you have mastered walking with help, you may be ready to try it again on your own.  This information is provided by [facility] and is not intended to replace the medical advice of your physician or other health care providers. Please consult your physician or other health care providers for advice regarding your specific medical condition.

## 2017-06-13 ENCOUNTER — Encounter: Payer: Self-pay | Admitting: Neurology

## 2017-06-14 ENCOUNTER — Telehealth: Payer: Self-pay | Admitting: *Deleted

## 2017-06-14 LAB — PROTEIN ELECTROPHORESIS, SERUM
ALPHA 2: 0.5 g/dL (ref 0.5–0.9)
Albumin ELP: 4.3 g/dL (ref 3.8–4.8)
Alpha 1: 0.3 g/dL (ref 0.2–0.3)
Beta 2: 0.2 g/dL (ref 0.2–0.5)
Beta Globulin: 0.8 g/dL — ABNORMAL HIGH (ref 0.4–0.6)
GAMMA GLOBULIN: 0.8 g/dL (ref 0.8–1.7)
Total Protein: 6.9 g/dL (ref 6.1–8.1)

## 2017-06-14 LAB — IMMUNOFIXATION ELECTROPHORESIS
IGM, SERUM: 161 mg/dL (ref 48–271)
IMMUNOFIX ELECTR INT: DETECTED
IgG (Immunoglobin G), Serum: 709 mg/dL (ref 694–1618)
Immunoglobulin A: 268 mg/dL (ref 81–463)

## 2017-06-14 LAB — COPPER, SERUM: COPPER: 101 ug/dL (ref 70–175)

## 2017-06-14 LAB — VITAMIN B1: VITAMIN B1 (THIAMINE): 108 nmol/L — AB (ref 8–30)

## 2017-06-14 NOTE — Telephone Encounter (Signed)
Wrong pt

## 2017-06-14 NOTE — Telephone Encounter (Signed)
Patient would like to try the nortriptyline as mentioned in your last note.

## 2017-06-14 NOTE — Telephone Encounter (Signed)
-----   Message from Alda Berthold, DO sent at 06/14/2017  3:27 PM EST ----- Please inform pt that his vitamin levels are normal, but there is evidence of extra protein in the blood, which can sometimes be associated with neuropathy and other times be nonspecific findings.  To better determine whether this protein is normal for patient or associated with other conditions, I would like for him to see hematology for monoclonal gammopathy. Thanks.

## 2017-06-15 DIAGNOSIS — S61202A Unspecified open wound of right middle finger without damage to nail, initial encounter: Secondary | ICD-10-CM | POA: Diagnosis not present

## 2017-06-17 ENCOUNTER — Telehealth: Payer: Self-pay | Admitting: *Deleted

## 2017-06-17 ENCOUNTER — Other Ambulatory Visit: Payer: Self-pay | Admitting: *Deleted

## 2017-06-17 ENCOUNTER — Telehealth: Payer: Self-pay | Admitting: Internal Medicine

## 2017-06-17 DIAGNOSIS — D472 Monoclonal gammopathy: Secondary | ICD-10-CM

## 2017-06-17 NOTE — Telephone Encounter (Signed)
Left message on voicemail for patient regarding appointment and asked that he call back to confirm appt.

## 2017-06-17 NOTE — Telephone Encounter (Signed)
-----   Message from Alda Berthold, DO sent at 06/14/2017  3:27 PM EST ----- Please inform pt that his vitamin levels are normal, but there is evidence of extra protein in the blood, which can sometimes be associated with neuropathy and other times be nonspecific findings.  To better determine whether this protein is normal for patient or associated with other conditions, I would like for him to see hematology for monoclonal gammopathy. Thanks.

## 2017-06-17 NOTE — Telephone Encounter (Signed)
Patient's wife given results and instructions.  Referral sent to hematology.

## 2017-06-24 ENCOUNTER — Encounter: Payer: Self-pay | Admitting: Internal Medicine

## 2017-06-24 ENCOUNTER — Ambulatory Visit (INDEPENDENT_AMBULATORY_CARE_PROVIDER_SITE_OTHER): Payer: PPO | Admitting: Internal Medicine

## 2017-06-24 VITALS — BP 118/70 | HR 89 | Temp 98.1°F | Ht 61.75 in | Wt 140.0 lb

## 2017-06-24 DIAGNOSIS — F1021 Alcohol dependence, in remission: Secondary | ICD-10-CM | POA: Diagnosis not present

## 2017-06-24 DIAGNOSIS — I482 Chronic atrial fibrillation, unspecified: Secondary | ICD-10-CM

## 2017-06-24 DIAGNOSIS — Z23 Encounter for immunization: Secondary | ICD-10-CM

## 2017-06-24 DIAGNOSIS — F39 Unspecified mood [affective] disorder: Secondary | ICD-10-CM | POA: Diagnosis not present

## 2017-06-24 DIAGNOSIS — I10 Essential (primary) hypertension: Secondary | ICD-10-CM

## 2017-06-24 DIAGNOSIS — Z0000 Encounter for general adult medical examination without abnormal findings: Secondary | ICD-10-CM | POA: Diagnosis not present

## 2017-06-24 DIAGNOSIS — Z7189 Other specified counseling: Secondary | ICD-10-CM | POA: Diagnosis not present

## 2017-06-24 DIAGNOSIS — I5032 Chronic diastolic (congestive) heart failure: Secondary | ICD-10-CM

## 2017-06-24 NOTE — Assessment & Plan Note (Signed)
BP Readings from Last 3 Encounters:  06/24/17 118/70  06/07/17 120/70  05/15/17 (!) 150/78   Good control

## 2017-06-24 NOTE — Assessment & Plan Note (Signed)
Compensated now Was mostly noticeable with severe anemia

## 2017-06-24 NOTE — Assessment & Plan Note (Signed)
Chronic anxiety mostly Okay on escitalopram

## 2017-06-24 NOTE — Addendum Note (Signed)
Addended by: Pilar Grammes on: 06/24/2017 05:12 PM   Modules accepted: Orders

## 2017-06-24 NOTE — Assessment & Plan Note (Signed)
I have personally reviewed the Medicare Annual Wellness questionnaire and have noted 1. The patient's medical and social history 2. Their use of alcohol, tobacco or illicit drugs 3. Their current medications and supplements 4. The patient's functional ability including ADL's, fall risks, home safety risks and hearing or visual             impairment. 5. Diet and physical activities 6. Evidence for depression or mood disorders  The patients weight, height, BMI and visual acuity have been recorded in the chart I have made referrals, counseling and provided education to the patient based review of the above and I have provided the pt with a written personalized care plan for preventive services.  I have provided you with a copy of your personalized plan for preventive services. Please take the time to review along with your updated medication list.  Will update pneumovax Discussed exercise No cancer screening due to age

## 2017-06-24 NOTE — Assessment & Plan Note (Signed)
See social history 

## 2017-06-24 NOTE — Progress Notes (Signed)
Subjective:    Patient ID: Eduardo [name], male    DOB: Apr 23, 1931, 84 y.o.   MRN: [id number]  HPI Here with wife for Medicare wellness and follow up of chronic health conditions Reviewed form and advanced directives Reviewed other doctors--he has a list which I reviewed Continues to exercise regularly Vision is okay Hearing is not great---doesn't wear hearing aides Did fall once--minor bruising on back No persistent depression --not anhedonic Independent wit instrumental ADLs Mild memory issues--nothing worrisome  Recent visit with neurologist about the neuropathy Abnormal blood protein test--- now seeing hematologist just to be sure  Keeps up with cardiologist Out of beat but not too fast---he doesn't feel any palpitations No chest pain No SOB Occasional mild ankle edema--they monitor Weights daily---down now here No dizziness or syncope  No clear weakness now Stroke was about 2 years ago Still going for physical therapist  Breathing is okay No chronic cough Keeps up with Dr Lamonte Sakai  Continues on lexapro Mostly for anxiety  Doing okay on this  Reviewed ---past alcoholism Remission x almost 40 years  Current Outpatient Medications on File Prior to Visit  Medication Sig Dispense Refill  . acetaminophen (TYLENOL) 650 MG CR tablet Take 1,300 mg by mouth 2 (two) times daily.    Marland Kitchen albuterol (PROAIR HFA) 108 (90 Base) MCG/ACT inhaler Inhale 2 puffs into the lungs every 6 (six) hours as needed for wheezing or shortness of breath. 18 g 0  . albuterol (PROVENTIL) (2.5 MG/3ML) 0.083% nebulizer solution Take 3 mLs (2.5 mg total) by nebulization every 4 (four) hours as needed for wheezing or shortness of breath. 90 vial 1  . apixaban (ELIQUIS) 5 MG TABS tablet Take 1 tablet (5 mg total) by mouth 2 (two) times daily. 180 tablet 3  . atorvastatin (LIPITOR) 80 MG tablet TAKE ONE TABLET BY MOUTH ONE TIME DAILY  90 tablet 3  . azelastine (ASTELIN) 0.1 % nasal spray Place 2  sprays into both nostrils at bedtime. Use in each nostril as directed    . B Complex-C (SUPER B COMPLEX PO) Take 1 tablet by mouth at bedtime.     . cetirizine (ZYRTEC) 10 MG tablet Take 10 mg by mouth at bedtime.     . cholecalciferol (VITAMIN D) 1000 units tablet Take 1,000 Units by mouth daily.    Marland Kitchen escitalopram (LEXAPRO) 20 MG tablet Take 20 mg by mouth at bedtime.     . fenofibrate 160 MG tablet Take 1 tablet (160 mg total) by mouth at bedtime. 90 tablet 3  . furosemide (LASIX) 20 MG tablet Take 1 tablet (20 mg total) by mouth daily. 90 tablet 3  . hydrALAZINE (APRESOLINE) 10 MG tablet Take 1 tablet (10 mg total) by mouth 3 (three) times daily. 270 tablet 3  . ipratropium (ATROVENT) 0.03 % nasal spray PLACE 2 SPRAYS INTO BOTH NOSTRILS AT BEDTIME. 30 mL 4  . lamoTRIgine (LAMICTAL) 150 MG tablet Take 150 mg by mouth at bedtime. Reported on 08/14/2015    . Multiple Vitamin (MULTIVITAMIN) tablet Take 1 tablet by mouth at bedtime.     . pantoprazole (PROTONIX) 40 MG tablet TAKE 1 TABLET (40 MG TOTAL) BY MOUTH DAILY BEFORE BREAKFAST. 90 tablet 3  . STIOLTO RESPIMAT 2.5-2.5 MCG/ACT AERS INHALE 2 PUFFS INTO THE LUNGS DAILY 12 Inhaler 3  . triamcinolone cream (KENALOG) 0.1 % Apply 1 application topically 2 (two) times daily as needed (for skin).     . Turmeric 500 MG TABS Take 2 tablets  by mouth.    . verapamil (CALAN-SR) 240 MG CR tablet Take 1 tablet (240 mg total) by mouth at bedtime. 90 tablet 3   No current facility-administered medications on file prior to visit.     Allergies  Allergen Reactions  . Penicillins Itching    Has patient had a PCN reaction causing immediate rash, facial/tongue/throat swelling, SOB or lightheadedness with hypotension: Yes Has patient had a PCN reaction causing severe rash involving mucus membranes or skin necrosis: No Has patient had a PCN reaction that required hospitalization No Has patient had a PCN reaction occurring within the last 10 years: No If all of  the above answers are "NO", then may proceed with Cephalosporin use.   . Sulfonamide Derivatives Other (See Comments)    "it affected my kidneys"  . Tape Other (See Comments)    Arms black and blue, tears skin.  Please use "paper" tape    Past Medical History:  Diagnosis Date  . Allergy   . Anemia 2012, 08/2015   chronic. Acute due to large hematoma 2013. 2017: due to gastric AVM bleeding.   . Anxiety   . Atrial fibrillation ([place]) 2012   a.  CHA2DS2VASc = 6-->eliquis;  b. 12/2015 Echo: EF 60-65%, no rwma, LVH, mild AS/MS/MR, sev dil LA, mild TR, PASP 98mmHg.  Marland Kitchen CAD (coronary artery disease)    a. 06/2010 Cath: LM nl, LAD 50p/m, LCX 20m, RCA 50p/m -->catheter dissection but good flow, 70d, RPDA 50-70;  b. 06/2010 Relook cath due to bradycardia and inf ST elev: RCA dissection @ ostium extending into coronary cusp and prox RCA, Ao dissection-->less staining than previously-->Med Rx.  Marland Kitchen COPD (chronic obstructive pulmonary disease) ([place]) 2013   exertional dyspnea  . Gastric angiodysplasia with hemorrhage 08/2015  . Hx of colonic polyps 2001, 2011   adenomatous 2001. HP 2001, 2011.  Marland Kitchen Hyperlipidemia   . Myocardial infarction ([place]) 06/2010.  12/2011.   "twice; 1 day apart; during/after cath". NQMI in setting severe anemia 2013.   Marland Kitchen Neuropathy 2014   in feet, likely due to spinal stenosis, DDD, HNP  . Osteoarthritis 2002   spinal stenosis, spondylolisthesis, disc protrusion multilevel in lumbar spine  . Stroke ([place]) 2016  . Upper GI bleed 08/2015   EGD: 2 small gastric AVMs, 1 actively bleeding.  Both treated with APC ablation, clipping of the bleeder    Past Surgical History:  Procedure Laterality Date  . APPENDECTOMY    . CARDIAC CATHETERIZATION  2012   50% LAD and luminal irreg in RCA, 1/12  Post cath MI from damage  . CARPAL TUNNEL RELEASE     left  . CARPAL TUNNEL RELEASE  10/11/11   Dr Rush Barer  . CATARACT EXTRACTION W/ INTRAOCULAR LENS IMPLANT  2/13   Dr [name]  .  COLONOSCOPY  2001, 02/2010   Dr Deatra Ina.   . ESOPHAGOGASTRODUODENOSCOPY N/A 08/15/2015   [name] MD; 2 small gastric AVMs, 1 actively bleeding.  Both treated with APC ablation, clipping of the bleeder  . FOOT FRACTURE SURGERY     right heel repair  . FRACTURE SURGERY    . KNEE ARTHROSCOPY     left, right knee 10/2009  . MASTOIDECTOMY     x3 on right  . NASAL SEPTUM SURGERY     nasal septal repair  . PENILE PROSTHESIS  REMOVAL    . PENILE PROSTHESIS IMPLANT     x 2--got infection after 2nd and had to remove  . PILONIDAL CYST /  SINUS EXCISION    . SHOULDER ARTHROSCOPY     right  . TONSILLECTOMY AND ADENOIDECTOMY      Family History  Problem Relation Age of Onset  . Cancer Mother        ? leukemia  . Heart attack Father   . Stroke Maternal Grandmother   . Diabetes Neg Hx   . Hypertension Neg Hx     Social History   Socioeconomic History  . Marital status: Married    Spouse name: Not on file  . Number of children: 4  . Years of education: some college  . Highest education level: Not on file  Social Needs  . Financial resource strain: Not on file  . Food insecurity - worry: Not on file  . Food insecurity - inability: Not on file  . Transportation needs - medical: Not on file  . Transportation needs - non-medical: Not on file  Occupational History  . Occupation: Retired Chief Strategy Officer the job trainin)  Tobacco Use  . Smoking status: Former Smoker    Packs/day: 2.50    Years: 30.00    Pack years: 75.00    Types: Cigarettes    Last attempt to quit: 06/11/1978    Years since quitting: 39.0  . Smokeless tobacco: Never Used  Substance and Sexual Activity  . Alcohol use: No    Comment: [id number] alcoholic  . Drug use: No  . Sexual activity: Not Currently  Other Topics Concern  . Not on file  Social History Narrative   Grew up in [place], [place].  Lives in [place].   Married---2nd   2 children ([place], [place]  and near [place])--2 stepchildren    Former Smoker--quit in 1980   Alcohol use-no. Quit in 1980--alcoholic      Has living will.   Has designated wife, then step daughter [name] have health care POA.   Would accept resuscitation attempts.   No feeding tube if cognitively unaware.   Review of Systems Sleeps well Appetite is good Weight is down is down some Wears seat belt Teeth okay--keeps up with dentist No sig joint swelling. Mild chronic back pain---"tolerable" with his tylenol Bowels are fine. No blood Urine flow is slow--- but seems to empty okay No heartburn or dysphagia--stays on the protonix every other day     Objective:   Physical Exam  Constitutional: He is oriented to person, place, and time. He appears well-developed. No distress.  HENT:  Mouth/Throat: Oropharynx is clear and moist. No oropharyngeal exudate.  Neck: No thyromegaly present.  Cardiovascular: Normal rate and normal heart sounds. Exam reveals no gallop.  No murmur heard. Irregular Faint pedal pulses  Pulmonary/Chest: Effort normal and breath sounds normal. No respiratory distress. He has no wheezes. He has no rales.  Abdominal: Soft. He exhibits no distension. There is no tenderness. There is no rebound and no guarding.  Musculoskeletal: He exhibits no edema or tenderness.  Lymphadenopathy:    He has no cervical adenopathy.  Neurological: He is alert and oriented to person, place, and time.  President-- "Trump, ?"  Then "Obama" [id number] Recall 3/3  Skin: No rash noted. No erythema.  Psychiatric: He has a normal mood and affect. His behavior is normal.          Assessment & Plan:

## 2017-06-24 NOTE — Assessment & Plan Note (Signed)
Rate controlled  On eliquis

## 2017-06-24 NOTE — Progress Notes (Signed)
Hearing Screening Comments: Has hearing aids. Not wearing them today Vision Screening Comments: March 2018

## 2017-06-24 NOTE — Assessment & Plan Note (Signed)
Long standing abstinence

## 2017-06-25 LAB — CBC
HCT: 37 % — ABNORMAL LOW (ref 39.0–52.0)
Hemoglobin: 11.9 g/dL — ABNORMAL LOW (ref 13.0–17.0)
MCHC: 32.2 g/dL (ref 30.0–36.0)
MCV: 90.2 fl (ref 78.0–100.0)
PLATELETS: 222 10*3/uL (ref 150.0–400.0)
RBC: 4.1 Mil/uL — ABNORMAL LOW (ref 4.22–5.81)
RDW: 16 % — ABNORMAL HIGH (ref 11.5–15.5)
WBC: 6.8 10*3/uL (ref 4.0–10.5)

## 2017-06-25 LAB — COMPREHENSIVE METABOLIC PANEL
ALBUMIN: 4.4 g/dL (ref 3.5–5.2)
ALK PHOS: 50 U/L (ref 39–117)
ALT: 16 U/L (ref 0–53)
AST: 31 U/L (ref 0–37)
BILIRUBIN TOTAL: 0.4 mg/dL (ref 0.2–1.2)
BUN: 33 mg/dL — ABNORMAL HIGH (ref 6–23)
CALCIUM: 10.1 mg/dL (ref 8.4–10.5)
CO2: 31 mEq/L (ref 19–32)
Chloride: 105 mEq/L (ref 96–112)
Creatinine, Ser: 1.13 mg/dL (ref 0.40–1.50)
GFR: 65.68 mL/min (ref >60.00)
Glucose, Bld: 96 mg/dL (ref 70–99)
Potassium: 4.3 mEq/L (ref 3.5–5.1)
Sodium: 142 mEq/L (ref 135–145)
TOTAL PROTEIN: 7 g/dL (ref 6.0–8.3)

## 2017-06-25 LAB — LIPID PANEL
CHOLESTEROL: 132 mg/dL (ref 0–200)
HDL: 44.8 mg/dL (ref >39.00)
Total CHOL/HDL Ratio: 3

## 2017-06-25 LAB — LDL CHOLESTEROL, DIRECT: LDL DIRECT: 69 mg/dL

## 2017-06-29 ENCOUNTER — Other Ambulatory Visit: Payer: Self-pay | Admitting: Internal Medicine

## 2017-07-01 ENCOUNTER — Encounter: Payer: Self-pay | Admitting: Internal Medicine

## 2017-07-01 ENCOUNTER — Telehealth: Payer: Self-pay | Admitting: Internal Medicine

## 2017-07-01 ENCOUNTER — Inpatient Hospital Stay: Payer: PPO

## 2017-07-01 ENCOUNTER — Inpatient Hospital Stay: Payer: PPO | Attending: Internal Medicine | Admitting: Internal Medicine

## 2017-07-01 VITALS — BP 157/69 | HR 74 | Temp 97.5°F | Resp 18 | Ht 61.75 in | Wt 150.3 lb

## 2017-07-01 DIAGNOSIS — D472 Monoclonal gammopathy: Secondary | ICD-10-CM | POA: Diagnosis not present

## 2017-07-01 LAB — CBC WITH DIFFERENTIAL (CANCER CENTER ONLY)
BASOS ABS: 0 10*3/uL (ref 0.0–0.1)
BASOS PCT: 0 %
EOS PCT: 4 %
Eosinophils Absolute: 0.3 10*3/uL (ref 0.0–0.5)
HCT: 32.5 % — ABNORMAL LOW (ref 38.4–49.9)
Hemoglobin: 10.2 g/dL — ABNORMAL LOW (ref 13.0–17.1)
Lymphocytes Relative: 19 %
Lymphs Abs: 1.3 10*3/uL (ref 0.9–3.3)
MCH: 28.7 pg (ref 27.2–33.4)
MCHC: 31.4 g/dL — ABNORMAL LOW (ref 32.0–36.0)
MCV: 91.3 fL (ref 79.3–98.0)
MONO ABS: 0.5 10*3/uL (ref 0.1–0.9)
Monocytes Relative: 7 %
Neutro Abs: 4.8 10*3/uL (ref 1.5–6.5)
Neutrophils Relative %: 70 %
PLATELETS: 175 10*3/uL (ref 140–400)
RBC: 3.56 MIL/uL — AB (ref 4.20–5.82)
RDW: 15.7 % — ABNORMAL HIGH (ref 11.0–15.6)
WBC: 7 10*3/uL (ref 4.0–10.3)

## 2017-07-01 LAB — CMP (CANCER CENTER ONLY)
ALBUMIN: 4.1 g/dL (ref 3.5–5.0)
ALK PHOS: 56 U/L (ref 40–150)
ALT: 23 U/L (ref 0–55)
AST: 37 U/L — AB (ref 5–34)
Anion gap: 9 (ref 3–11)
BILIRUBIN TOTAL: 0.4 mg/dL (ref 0.2–1.2)
BUN: 35 mg/dL — AB (ref 7–26)
CALCIUM: 9.9 mg/dL (ref 8.4–10.4)
CO2: 26 mmol/L (ref 22–29)
Chloride: 106 mmol/L (ref 98–109)
Creatinine: 1.21 mg/dL (ref 0.70–1.30)
GFR, EST NON AFRICAN AMERICAN: 53 mL/min — AB (ref >60)
GFR, Est AFR Am: >60 mL/min (ref >60)
GLUCOSE: 83 mg/dL (ref 70–140)
Potassium: 4.3 mmol/L (ref 3.5–5.1)
Sodium: 141 mmol/L (ref 136–145)
TOTAL PROTEIN: 6.9 g/dL (ref 6.4–8.3)

## 2017-07-01 LAB — LACTATE DEHYDROGENASE: LDH: 229 U/L (ref 125–245)

## 2017-07-01 NOTE — Telephone Encounter (Signed)
Scheduled appt per 1/21 los

## 2017-07-01 NOTE — Progress Notes (Signed)
[facility] [phone]   [phone]  CONSULT NOTE  REFERRING PHYSICIAN: Dr. [name]  REASON FOR CONSULTATION:  84 years old white male with questionable monoclonal gammopathy.  HPI TKAI SERFASS is a 84 y.o. male with past medical history significant for multiple medical problems including history of anemia, atrial fibrillation, coronary artery disease and myocardial infarction, COPD, colon polyps, dyslipidemia, triglyceridemia, osteoarthritis, spinal stenosis, stroke as well as peripheral neuropathy.  The patient was seen by a neurologist Dr. Posey Pronto for evaluation of balance issue.  She order several studies for evaluation of his condition including serum protein electrophoresis which showed isolated increase in beta 1 globulins suggestive of iron deficiency however the presence of a monoclonal protein could not be ruled out.  She referred him back to me for evaluation and recommendation regarding this condition. When seen today the patient is feeling fine and has no specific complaints.  He denied having any chest pain but has shortness of breath with exertion secondary to COPD, he denied having any cough or hemoptysis.  He has no nausea, vomiting, diarrhea or constipation.  He intentionally lost some weight recently.  He denied having any headache or visual changes. Family history significant for mother with essential thrombocythemia and questionable leukemia.  Father died from heart disease and maternal grandmother had stroke. The patient is married and has 2 sons and 2 stepchildren.  He was accompanied by his wife [name].  He used to work as an Art gallery manager.  He has a history for smoking but quit smoking and alcohol 4 years ago.  He has no history of drug abuse.  HPI  Past Medical History:  Diagnosis Date  . Allergy   . Anemia 2012, 08/2015   chronic. Acute due to large hematoma 2013. 2017: due to gastric AVM bleeding.   . Anxiety     . Atrial fibrillation ([place]) 2012   a.  CHA2DS2VASc = 6-->eliquis;  b. 12/2015 Echo: EF 60-65%, no rwma, LVH, mild AS/MS/MR, sev dil LA, mild TR, PASP 72mHg.  .Marland KitchenCAD (coronary artery disease)    a. 06/2010 Cath: LM nl, LAD 50p/m, LCX 454mRCA 50p/m -->catheter dissection but good flow, 70d, RPDA 50-70;  b. 06/2010 Relook cath due to bradycardia and inf ST elev: RCA dissection @ ostium extending into coronary cusp and prox RCA, Ao dissection-->less staining than previously-->Med Rx.  . Marland KitchenOPD (chronic obstructive pulmonary disease) ([facility]   exertional dyspnea  . Gastric angiodysplasia with hemorrhage 08/2015  . Hx of colonic polyps 2001, 2011   adenomatous 2001. HP 2001, 2011.  . Marland Kitchenyperlipidemia   . Myocardial infarction ([facility]/2012.  12/2011.   "twice; 1 day apart; during/after cath". NQMI in setting severe anemia 2013.   . Marland Kitcheneuropathy 2014   in feet, likely due to spinal stenosis, DDD, HNP  . Osteoarthritis 2002   spinal stenosis, spondylolisthesis, disc protrusion multilevel in lumbar spine  . Stroke ([facility]  . Upper GI bleed 08/2015   EGD: 2 small gastric AVMs, 1 actively bleeding.  Both treated with APC ablation, clipping of the bleeder    Past Surgical History:  Procedure Laterality Date  . APPENDECTOMY    . CARDIAC CATHETERIZATION  2012   50% LAD and luminal irreg in RCA, 1/12  Post cath MI from damage  . CARPAL TUNNEL RELEASE     left  . CARPAL TUNNEL RELEASE  10/11/11   Dr WhRush Barer. CATARACT EXTRACTION W/ INTRAOCULAR LENS IMPLANT  2/13   Dr [name]  . COLONOSCOPY  2001, 02/2010   Dr Deatra Ina.   . ESOPHAGOGASTRODUODENOSCOPY N/A 08/15/2015   [name] MD; 2 small gastric AVMs, 1 actively bleeding.  Both treated with APC ablation, clipping of the bleeder  . FOOT FRACTURE SURGERY     right heel repair  . FRACTURE SURGERY    . KNEE ARTHROSCOPY     left, right knee 10/2009  . MASTOIDECTOMY     x3 on right  . NASAL SEPTUM SURGERY     nasal septal repair  .  PENILE PROSTHESIS  REMOVAL    . PENILE PROSTHESIS IMPLANT     x 2--got infection after 2nd and had to remove  . PILONIDAL CYST / SINUS EXCISION    . SHOULDER ARTHROSCOPY     right  . TONSILLECTOMY AND ADENOIDECTOMY      Family History  Problem Relation Age of Onset  . Cancer Mother        ? leukemia  . Heart attack Father   . Stroke Maternal Grandmother   . Diabetes Neg Hx   . Hypertension Neg Hx     Social History Social History   Tobacco Use  . Smoking status: Former Smoker    Packs/day: 2.50    Years: 30.00    Pack years: 75.00    Types: Cigarettes    Last attempt to quit: 06/11/1978    Years since quitting: 39.0  . Smokeless tobacco: Never Used  Substance Use Topics  . Alcohol use: No    Comment: QUIT [id number] alcoholic  . Drug use: No    Allergies  Allergen Reactions  . Penicillins Itching    Has patient had a PCN reaction causing immediate rash, facial/tongue/throat swelling, SOB or lightheadedness with hypotension: Yes Has patient had a PCN reaction causing severe rash involving mucus membranes or skin necrosis: No Has patient had a PCN reaction that required hospitalization No Has patient had a PCN reaction occurring within the last 10 years: No If all of the above answers are "NO", then may proceed with Cephalosporin use.   . Sulfonamide Derivatives Other (See Comments)    "it affected my kidneys"  . Tape Other (See Comments)    Arms black and blue, tears skin.  Please use "paper" tape    Current Outpatient Medications  Medication Sig Dispense Refill  . acetaminophen (TYLENOL) 650 MG CR tablet Take 1,300 mg by mouth 2 (two) times daily.    Marland Kitchen albuterol (PROAIR HFA) 108 (90 Base) MCG/ACT inhaler Inhale 2 puffs into the lungs every 6 (six) hours as needed for wheezing or shortness of breath. 18 g 0  . albuterol (PROVENTIL) (2.5 MG/3ML) 0.083% nebulizer solution Take 3 mLs (2.5 mg total) by nebulization every 4 (four) hours as needed for wheezing or shortness of  breath. 90 vial 1  . apixaban (ELIQUIS) 5 MG TABS tablet Take 1 tablet (5 mg total) by mouth 2 (two) times daily. 180 tablet 3  . atorvastatin (LIPITOR) 80 MG tablet TAKE ONE TABLET BY MOUTH ONE TIME DAILY  90 tablet 3  . azelastine (ASTELIN) 0.1 % nasal spray Place 2 sprays into both nostrils at bedtime. Use in each nostril as directed    . B Complex-C (SUPER B COMPLEX PO) Take 1 tablet by mouth at bedtime.     . cetirizine (ZYRTEC) 10 MG tablet Take 10 mg by mouth at bedtime.     . cholecalciferol (VITAMIN D) 1000 units tablet Take 1,000  Units by mouth daily.    Marland Kitchen escitalopram (LEXAPRO) 20 MG tablet Take 20 mg by mouth at bedtime.     . fenofibrate 160 MG tablet TAKE 1 TABLET BY MOUTH AT BEDTIME. 90 tablet 3  . furosemide (LASIX) 20 MG tablet Take 1 tablet (20 mg total) by mouth daily. 90 tablet 3  . hydrALAZINE (APRESOLINE) 10 MG tablet Take 1 tablet (10 mg total) by mouth 3 (three) times daily. 270 tablet 3  . ipratropium (ATROVENT) 0.03 % nasal spray PLACE 2 SPRAYS INTO BOTH NOSTRILS AT BEDTIME. 30 mL 4  . lamoTRIgine (LAMICTAL) 150 MG tablet Take 150 mg by mouth at bedtime. Reported on 08/14/2015    . Multiple Vitamin (MULTIVITAMIN) tablet Take 1 tablet by mouth at bedtime.     . pantoprazole (PROTONIX) 40 MG tablet TAKE 1 TABLET (40 MG TOTAL) BY MOUTH DAILY BEFORE BREAKFAST. 90 tablet 3  . STIOLTO RESPIMAT 2.5-2.5 MCG/ACT AERS INHALE 2 PUFFS INTO THE LUNGS DAILY 12 Inhaler 3  . triamcinolone cream (KENALOG) 0.1 % Apply 1 application topically 2 (two) times daily as needed (for skin).     . Turmeric 500 MG TABS Take 2 tablets by mouth.    . verapamil (CALAN-SR) 240 MG CR tablet Take 1 tablet (240 mg total) by mouth at bedtime. 90 tablet 3   No current facility-administered medications for this visit.     Review of Systems  Constitutional: positive for fatigue Eyes: negative Ears, nose, mouth, throat, and face: negative Respiratory: positive for dyspnea on exertion Cardiovascular:  negative Gastrointestinal: negative Genitourinary:negative Integument/breast: negative Hematologic/lymphatic: negative Musculoskeletal:negative Neurological: positive for paresthesia Behavioral/Psych: negative Endocrine: negative Allergic/Immunologic: negative  Physical Exam  HBZ:JIRCV, healthy, no distress, well nourished and well developed SKIN: skin color, texture, turgor are normal, no rashes or significant lesions HEAD: Normocephalic, No masses, lesions, tenderness or abnormalities EYES: normal, PERRLA, Conjunctiva are pink and non-injected EARS: External ears normal, Canals clear OROPHARYNX:no exudate, no erythema and lips, buccal mucosa, and tongue normal  NECK: supple, no adenopathy, no JVD LYMPH:  no palpable lymphadenopathy, no hepatosplenomegaly LUNGS: clear to auscultation , and palpation HEART: regular rate & rhythm, no murmurs and no gallops ABDOMEN:abdomen soft, non-tender, normal bowel sounds and no masses or organomegaly BACK: Back symmetric, no curvature., No CVA tenderness EXTREMITIES:no joint deformities, effusion, or inflammation, no edema, no skin discoloration  NEURO: alert & oriented x 3 with fluent speech, no focal motor/sensory deficits  PERFORMANCE STATUS: ECOG 1  LABORATORY DATA: Lab Results  Component Value Date   WBC 6.8 06/24/2017   HGB 11.9 (L) 06/24/2017   HCT 37.0 (L) 06/24/2017   MCV 90.2 06/24/2017   PLT 222.0 06/24/2017      Chemistry      Component Value Date/Time   NA 142 06/24/2017 1709   NA 143 08/08/2016 0847   NA 142 09/08/2011 0956   K 4.3 06/24/2017 1709   K 3.9 09/08/2011 0956   CL 105 06/24/2017 1709   CL 105 09/08/2011 0956   CO2 31 06/24/2017 1709   CO2 26 09/08/2011 0956   BUN 33 (H) 06/24/2017 1709   BUN 25 08/08/2016 0847   BUN 20 (H) 09/08/2011 0956   CREATININE 1.13 06/24/2017 1709   CREATININE 1.01 08/22/2015 0957      Component Value Date/Time   CALCIUM 10.1 06/24/2017 1709   CALCIUM 9.0 09/08/2011  0956   ALKPHOS 50 06/24/2017 1709   AST 31 06/24/2017 1709   ALT 16 06/24/2017 1709  BILITOT 0.4 06/24/2017 1709       RADIOGRAPHIC STUDIES: No results found.  ASSESSMENT: This is a very pleasant 84 years old white male who presented for evaluation of monoclonal gammopathy.  It is not clear from his previous serum protein electrophoresis if the patient has any concerning abnormalities but with the persistent neuropathy he may benefit from additional study to rule out monoclonal gammopathy of undetermined significance or multiple myeloma.  PLAN: I had a lengthy discussion with the patient and his wife about his condition.  I recommended for him to have several studies performed today including repeat CBC, complaints metabolic panel, LDH, quantitative immunoglobulins with immunofixation as well as beta-2 microglobulin and serum light chain. I will call the patient with the results of these test and if there is any concerning abnormalities we will arrange for him to come back for follow-up visit in more detailed discussion of his condition and treatment options. If the lab tests showed no concerning abnormalities, the patient will continue his routine follow-up visit and evaluation by his primary care physician and neurologist. The patient was advised to call immediately if he has any other concerning symptoms in the interval. The patient voices understanding of current disease status and treatment options and is in agreement with the current care plan.  All questions were answered. The patient knows to call the clinic with any problems, questions or concerns. We can certainly see the patient much sooner if necessary.  Thank you so much for allowing me to participate in the care of [name]. I will continue to follow up the patient with you and assist in his care.  I spent 40 minutes counseling the patient face to face. The total time spent in the appointment was 60  minutes.  Disclaimer: This note was dictated with voice recognition software. Similar sounding words can inadvertently be transcribed and may not be corrected upon review.   Eilleen Kempf July 01, 2017, 11:48 AM

## 2017-07-02 LAB — KAPPA/LAMBDA LIGHT CHAINS
KAPPA FREE LGHT CHN: 24.5 mg/L — AB (ref 3.3–19.4)
Kappa, lambda light chain ratio: 2.02 — ABNORMAL HIGH (ref 0.26–1.65)
Lambda free light chains: 12.1 mg/L (ref 5.7–26.3)

## 2017-07-02 LAB — BETA 2 MICROGLOBULIN, SERUM: BETA 2 MICROGLOBULIN: 2.1 mg/L (ref 0.6–2.4)

## 2017-07-03 LAB — IMMUNOFIXATION ELECTROPHORESIS
IGA: 286 mg/dL (ref 61–437)
IGG (IMMUNOGLOBIN G), SERUM: 722 mg/dL (ref 700–1600)
IGM (IMMUNOGLOBULIN M), SRM: 156 mg/dL — AB (ref 15–143)
TOTAL PROTEIN ELP: 6.9 g/dL (ref 6.0–8.5)

## 2017-07-08 ENCOUNTER — Encounter: Payer: Self-pay | Admitting: Internal Medicine

## 2017-07-09 ENCOUNTER — Encounter: Payer: Self-pay | Admitting: Medical Oncology

## 2017-07-09 ENCOUNTER — Telehealth: Payer: Self-pay | Admitting: Medical Oncology

## 2017-07-09 NOTE — Telephone Encounter (Signed)
err

## 2017-07-10 DIAGNOSIS — F39 Unspecified mood [affective] disorder: Secondary | ICD-10-CM | POA: Diagnosis not present

## 2017-07-10 DIAGNOSIS — F411 Generalized anxiety disorder: Secondary | ICD-10-CM | POA: Diagnosis not present

## 2017-08-05 ENCOUNTER — Ambulatory Visit: Payer: PPO | Admitting: Emergency Medicine

## 2017-08-05 ENCOUNTER — Encounter: Payer: Self-pay | Admitting: Emergency Medicine

## 2017-08-05 DIAGNOSIS — J301 Allergic rhinitis due to pollen: Secondary | ICD-10-CM | POA: Diagnosis not present

## 2017-08-05 DIAGNOSIS — J439 Emphysema, unspecified: Secondary | ICD-10-CM

## 2017-08-05 MED ORDER — IPRATROPIUM BROMIDE 0.03 % NA SOLN: NASAL | 3 refills | Status: DC

## 2017-08-05 MED ORDER — TIOTROPIUM BROMIDE-OLODATEROL 2.5-2.5 MCG/ACT IN AERS: 2.0000 | INHALATION_SPRAY | Freq: Every day | RESPIRATORY_TRACT | 3 refills | Status: DC

## 2017-08-05 NOTE — Patient Instructions (Addendum)
Please continue current medications as you have been taking them. Continue your exercise regimen Follow with Dr Lamonte Sakai in 6 months or sooner if you have any problems

## 2017-08-05 NOTE — Assessment & Plan Note (Signed)
Doing well on Stiolto.  Continue same.  He uses albuterol on a schedule at least once a night.  He has good exercise tolerance.  No exacerbations.

## 2017-08-05 NOTE — Assessment & Plan Note (Signed)
He does have upper airway instability and aggressive control of his mucus production, allergic rhinitis is benefiting him.  Continue same regimen.

## 2017-08-05 NOTE — Progress Notes (Signed)
  Subjective:    Patient ID: Eduardo [name], male    DOB: January 20, 1931, 84 y.o.   MRN: [id number] HPI  ROV 08/05/17 --84 year old former smoker with a history of coronary artery disease, atrial fibrillation, allergic rhinitis, chronic cough with upper airway instability.  He also has COPD with an FEV1 of 1.03 L.  He is currently managed on Stiolto.  Also uses Zyrtec, Atrovent nasal spray for his chronic rhinitis. He still goes to the gym 3x a week. He began to get some increased congestion beginning of the year. Has been using mucinex, robutussin. Reliable with Stiolto. He has albuterol nebs, uses about 1x a day.    Review of Systems As per HPI    Objective:  Physical Exam Vitals:   08/05/17 1409 08/05/17 1410  BP:  128/74  Pulse:  70  SpO2:  94%  Weight: 151 lb (68.5 kg)   Height: 5\' 3"  (1.6 m)    Gen: Pleasant, well-nourished, in no distress,  normal affect  ENT: No lesions,  mouth clear,  oropharynx clear, no postnasal drip  Neck: No JVD, soft UA noise on expiration.   Lungs: No use of accessory muscles, clear B   Cardiovascular: irregular, heart sounds normal, no murmur or gallops, no peripheral edema  Musculoskeletal: No deformities, no cyanosis or clubbing  Neuro: alert, non focal  Skin: Warm, no lesions or rashes   Assessment & Plan:  COPD (chronic obstructive pulmonary disease) with emphysema ([place]) Doing well on Stiolto.  Continue same.  He uses albuterol on a schedule at least once a night.  He has good exercise tolerance.  No exacerbations.  Allergic rhinitis He does have upper airway instability and aggressive control of his mucus production, allergic rhinitis is benefiting him.  Continue same regimen.  Baltazar Apo, MD, PhD 08/05/2017, 2:35 PM [facility] [phone] or if no answer [phone]

## 2017-08-15 ENCOUNTER — Other Ambulatory Visit: Payer: Self-pay | Admitting: Physician Assistant

## 2017-08-15 NOTE — Telephone Encounter (Signed)
Age 84 years WT 68.5kg  08/05/2017 Saw Dr Tamala Julian 05/15/2017 07/01/2017 Hgb 10.2 HCT 32.5 07/01/2017 Sr Cr 1.21  Refill done for Eliquis 5mg  q 12 hours as requested

## 2017-09-04 DIAGNOSIS — L821 Other seborrheic keratosis: Secondary | ICD-10-CM | POA: Diagnosis not present

## 2017-09-04 DIAGNOSIS — D692 Other nonthrombocytopenic purpura: Secondary | ICD-10-CM | POA: Diagnosis not present

## 2017-09-04 DIAGNOSIS — L853 Xerosis cutis: Secondary | ICD-10-CM | POA: Diagnosis not present

## 2017-09-04 DIAGNOSIS — L700 Acne vulgaris: Secondary | ICD-10-CM | POA: Diagnosis not present

## 2017-09-10 ENCOUNTER — Other Ambulatory Visit: Payer: Self-pay | Admitting: Internal Medicine

## 2017-09-18 DIAGNOSIS — Z961 Presence of intraocular lens: Secondary | ICD-10-CM | POA: Diagnosis not present

## 2017-10-10 ENCOUNTER — Encounter: Payer: Self-pay | Admitting: Internal Medicine

## 2017-10-10 ENCOUNTER — Ambulatory Visit (INDEPENDENT_AMBULATORY_CARE_PROVIDER_SITE_OTHER): Payer: PPO | Admitting: Internal Medicine

## 2017-10-10 DIAGNOSIS — J019 Acute sinusitis, unspecified: Secondary | ICD-10-CM | POA: Insufficient documentation

## 2017-10-10 MED ORDER — PREDNISONE 20 MG PO TABS: 40.0000 mg | ORAL_TABLET | Freq: Every day | ORAL | 0 refills | Status: DC

## 2017-10-10 MED ORDER — DOXYCYCLINE HYCLATE 100 MG PO TABS: 100.0000 mg | ORAL_TABLET | Freq: Two times a day (BID) | ORAL | 0 refills | Status: DC

## 2017-10-10 MED ORDER — MONTELUKAST SODIUM 10 MG PO TABS: 10.0000 mg | ORAL_TABLET | Freq: Every day | ORAL | 11 refills | Status: DC

## 2017-10-10 NOTE — Assessment & Plan Note (Signed)
Mostly seems to be allergic May be getting secondary bacterial infection now Will treat with 3 days of prednisone Add singulair for allergy season Rx for doxy--only if worsens

## 2017-10-10 NOTE — Patient Instructions (Signed)
Start the montelukast (singulair) now and continue it through allergy season if it seems to help. Only start the doxycycline antibiotic if you are worsening in the next few days.

## 2017-10-10 NOTE — Progress Notes (Signed)
Subjective:    Patient ID: Eduardo [name], male    DOB: 10-04-1930, 84 y.o.   MRN: [id number]  HPI Here with wife due to respiratory infection Having post nasal drip and throat congestion--especially bad at night Started several weeks ago with the pollen  Added mucinex, neti pot, inhaler Continues to worsen No fever Fair amount of cough--- some sputum, yellow this morning (and possibly with slight blood) Has had some slight nosebleed  Zyrtec, astelin nasal spray, ?atrovent Bad allergy season this year again  Breathing is okay in day--then trouble at night (mostly nasal) Doesn't think it is his COPD  Current Outpatient Medications on File Prior to Visit  Medication Sig Dispense Refill  . acetaminophen (TYLENOL) 650 MG CR tablet Take 1,300 mg by mouth 2 (two) times daily.    Marland Kitchen albuterol (PROAIR HFA) 108 (90 Base) MCG/ACT inhaler Inhale 2 puffs into the lungs every 6 (six) hours as needed for wheezing or shortness of breath. 18 g 0  . albuterol (PROVENTIL) (2.5 MG/3ML) 0.083% nebulizer solution Take 3 mLs (2.5 mg total) by nebulization every 4 (four) hours as needed for wheezing or shortness of breath. 90 vial 1  . atorvastatin (LIPITOR) 80 MG tablet TAKE ONE TABLET BY MOUTH ONE TIME DAILY  90 tablet 3  . azelastine (ASTELIN) 0.1 % nasal spray Place 2 sprays into both nostrils at bedtime. Use in each nostril as directed    . B Complex-C (SUPER B COMPLEX PO) Take 1 tablet by mouth at bedtime.     . cetirizine (ZYRTEC) 10 MG tablet Take 10 mg by mouth at bedtime.     . cholecalciferol (VITAMIN D) 1000 units tablet Take 1,000 Units by mouth daily.    Marland Kitchen ELIQUIS 5 MG TABS tablet TAKE 1 TABLET BY MOUTH 2 TIMES DAILY 180 tablet 1  . escitalopram (LEXAPRO) 20 MG tablet Take 20 mg by mouth at bedtime.     . fenofibrate 160 MG tablet TAKE 1 TABLET BY MOUTH AT BEDTIME. 90 tablet 3  . furosemide (LASIX) 20 MG tablet Take 1 tablet (20 mg total) by mouth daily. 90 tablet 3  . hydrALAZINE  (APRESOLINE) 10 MG tablet Take 1 tablet (10 mg total) by mouth 3 (three) times daily. 270 tablet 3  . ipratropium (ATROVENT) 0.03 % nasal spray PLACE 2 SPRAYS INTO BOTH NOSTRILS AT BEDTIME. 90 mL 3  . lamoTRIgine (LAMICTAL) 150 MG tablet Take 150 mg by mouth at bedtime. Reported on 08/14/2015    . Multiple Vitamin (MULTIVITAMIN) tablet Take 1 tablet by mouth at bedtime.     . pantoprazole (PROTONIX) 40 MG tablet TAKE 1 TABLET BY MOUTH DAILY BEFORE BREAKFAST. 90 tablet 3  . Tiotropium Bromide-Olodaterol (STIOLTO RESPIMAT) 2.5-2.5 MCG/ACT AERS Inhale 2 puffs into the lungs daily. 12 Inhaler 3  . triamcinolone cream (KENALOG) 0.1 % Apply 1 application topically 2 (two) times daily as needed (for skin).     . Turmeric 500 MG TABS Take 2 tablets by mouth.    . verapamil (CALAN-SR) 240 MG CR tablet Take 1 tablet (240 mg total) by mouth at bedtime. 90 tablet 3   No current facility-administered medications on file prior to visit.     Allergies  Allergen Reactions  . Penicillins Itching    Has patient had a PCN reaction causing immediate rash, facial/tongue/throat swelling, SOB or lightheadedness with hypotension: Yes Has patient had a PCN reaction causing severe rash involving mucus membranes or skin necrosis: No Has patient had a  PCN reaction that required hospitalization No Has patient had a PCN reaction occurring within the last 10 years: No If all of the above answers are "NO", then may proceed with Cephalosporin use.   . Sulfonamide Derivatives Other (See Comments)    "it affected my kidneys"  . Tape Other (See Comments)    Arms black and blue, tears skin.  Please use "paper" tape    Past Medical History:  Diagnosis Date  . Allergy   . Anemia 2012, 08/2015   chronic. Acute due to large hematoma 2013. 2017: due to gastric AVM bleeding.   . Anxiety   . Atrial fibrillation ([place]) 2012   a.  CHA2DS2VASc = 6-->eliquis;  b. 12/2015 Echo: EF 60-65%, no rwma, LVH, mild AS/MS/MR, sev dil LA, mild  TR, PASP 71mmHg.  Marland Kitchen CAD (coronary artery disease)    a. 06/2010 Cath: LM nl, LAD 50p/m, LCX 15m, RCA 50p/m -->catheter dissection but good flow, 70d, RPDA 50-70;  b. 06/2010 Relook cath due to bradycardia and inf ST elev: RCA dissection @ ostium extending into coronary cusp and prox RCA, Ao dissection-->less staining than previously-->Med Rx.  Marland Kitchen COPD (chronic obstructive pulmonary disease) ([place]) 2013   exertional dyspnea  . Gastric angiodysplasia with hemorrhage 08/2015  . Hx of colonic polyps 2001, 2011   adenomatous 2001. HP 2001, 2011.  Marland Kitchen Hyperlipidemia   . Myocardial infarction ([place]) 06/2010.  12/2011.   "twice; 1 day apart; during/after cath". NQMI in setting severe anemia 2013.   Marland Kitchen Neuropathy 2014   in feet, likely due to spinal stenosis, DDD, HNP  . Osteoarthritis 2002   spinal stenosis, spondylolisthesis, disc protrusion multilevel in lumbar spine  . Stroke ([place]) 2016  . Upper GI bleed 08/2015   EGD: 2 small gastric AVMs, 1 actively bleeding.  Both treated with APC ablation, clipping of the bleeder    Past Surgical History:  Procedure Laterality Date  . APPENDECTOMY    . CARDIAC CATHETERIZATION  2012   50% LAD and luminal irreg in RCA, 1/12  Post cath MI from damage  . CARPAL TUNNEL RELEASE     left  . CARPAL TUNNEL RELEASE  10/11/11   Dr Rush Barer  . CATARACT EXTRACTION W/ INTRAOCULAR LENS IMPLANT  2/13   Dr [name]  . COLONOSCOPY  2001, 02/2010   Dr Deatra Ina.   . ESOPHAGOGASTRODUODENOSCOPY N/A 08/15/2015   [name] MD; 2 small gastric AVMs, 1 actively bleeding.  Both treated with APC ablation, clipping of the bleeder  . FOOT FRACTURE SURGERY     right heel repair  . FRACTURE SURGERY    . KNEE ARTHROSCOPY     left, right knee 10/2009  . MASTOIDECTOMY     x3 on right  . NASAL SEPTUM SURGERY     nasal septal repair  . PENILE PROSTHESIS  REMOVAL    . PENILE PROSTHESIS IMPLANT     x 2--got infection after 2nd and had to remove  . PILONIDAL CYST / SINUS  EXCISION    . SHOULDER ARTHROSCOPY     right  . TONSILLECTOMY AND ADENOIDECTOMY      Family History  Problem Relation Age of Onset  . Cancer Mother        ? leukemia  . Heart attack Father   . Stroke Maternal Grandmother   . Diabetes Neg Hx   . Hypertension Neg Hx     Social History   Socioeconomic History  . Marital status: Married    Spouse name:  Not on file  . Number of children: 4  . Years of education: some college  . Highest education level: Not on file  Occupational History  . Occupation: Retired Chief Strategy Officer the job trainin)  Social Needs  . Financial resource strain: Not on file  . Food insecurity:    Worry: Not on file    Inability: Not on file  . Transportation needs:    Medical: Not on file    Non-medical: Not on file  Tobacco Use  . Smoking status: Former Smoker    Packs/day: 2.50    Years: 30.00    Pack years: 75.00    Types: Cigarettes    Last attempt to quit: 06/11/1978    Years since quitting: 39.3  . Smokeless tobacco: Never Used  Substance and Sexual Activity  . Alcohol use: No    Comment: [id number] alcoholic  . Drug use: No  . Sexual activity: Not Currently  Lifestyle  . Physical activity:    Days per week: Not on file    Minutes per session: Not on file  . Stress: Not on file  Relationships  . Social connections:    Talks on phone: Not on file    Gets together: Not on file    Attends religious service: Not on file    Active member of club or organization: Not on file    Attends meetings of clubs or organizations: Not on file    Relationship status: Not on file  . Intimate partner violence:    Fear of current or ex partner: Not on file    Emotionally abused: Not on file    Physically abused: Not on file    Forced sexual activity: Not on file  Other Topics Concern  . Not on file  Social History Narrative   Grew up in [place], [place].  Lives in [place].   Married---2nd   2 children ([place], [place]  and near [place])--2  stepchildren   Former Smoker--quit in 1980   Alcohol use-no. Quit in 1980--alcoholic      Has living will.   Has designated wife, then step daughter [name] have health care POA.   Would accept resuscitation attempts.   No feeding tube if cognitively unaware.   Review of Systems  No vomiting or diarrhea Appetite is okay No rash     Objective:   Physical Exam  Constitutional: He appears well-developed. No distress.  HENT:  Mouth/Throat: Oropharynx is clear and moist. No oropharyngeal exudate.  No sinus tenderness TMs normal Moderate nasal swelling--mostly pale  Neck: No thyromegaly present.  Pulmonary/Chest: Effort normal. No stridor. No respiratory distress. He has no wheezes. He has no rales.  Slightly decreased breath sounds but clear  Lymphadenopathy:    He has no cervical adenopathy.          Assessment & Plan:

## 2017-10-12 ENCOUNTER — Other Ambulatory Visit: Payer: Self-pay | Admitting: Interventional Cardiology

## 2017-10-15 DIAGNOSIS — S81811A Laceration without foreign body, right lower leg, initial encounter: Secondary | ICD-10-CM | POA: Diagnosis not present

## 2017-10-15 DIAGNOSIS — Z7901 Long term (current) use of anticoagulants: Secondary | ICD-10-CM | POA: Diagnosis not present

## 2017-10-16 DIAGNOSIS — F39 Unspecified mood [affective] disorder: Secondary | ICD-10-CM | POA: Diagnosis not present

## 2017-10-16 DIAGNOSIS — F411 Generalized anxiety disorder: Secondary | ICD-10-CM | POA: Diagnosis not present

## 2017-10-20 ENCOUNTER — Encounter: Payer: Self-pay | Admitting: Internal Medicine

## 2017-10-22 ENCOUNTER — Other Ambulatory Visit: Payer: Self-pay | Admitting: Emergency Medicine

## 2017-10-23 ENCOUNTER — Ambulatory Visit (INDEPENDENT_AMBULATORY_CARE_PROVIDER_SITE_OTHER): Payer: PPO | Admitting: Internal Medicine

## 2017-10-23 ENCOUNTER — Encounter: Payer: Self-pay | Admitting: Internal Medicine

## 2017-10-23 VITALS — BP 138/70 | HR 73 | Temp 98.0°F | Wt 149.0 lb

## 2017-10-23 DIAGNOSIS — S81811A Laceration without foreign body, right lower leg, initial encounter: Secondary | ICD-10-CM | POA: Diagnosis not present

## 2017-10-23 NOTE — Progress Notes (Signed)
Subjective:    Patient ID: Eduardo [name], male    DOB: 13-Jan-1931, 84 y.o.   MRN: [id number]  HPI Here for check of leg laceration---concerned about how it looks Leaned over to pick up a pillow case and lost balance Hit right calf against the walker  Went to urgent care Had sutures put in--only on the surface Wife noted significant redness---she started him on doxycycline she had  Current Outpatient Medications on File Prior to Visit  Medication Sig Dispense Refill  . acetaminophen (TYLENOL) 650 MG CR tablet Take 1,300 mg by mouth 2 (two) times daily.    Marland Kitchen albuterol (PROAIR HFA) 108 (90 Base) MCG/ACT inhaler Inhale 2 puffs into the lungs every 6 (six) hours as needed for wheezing or shortness of breath. 18 g 0  . albuterol (PROVENTIL) (2.5 MG/3ML) 0.083% nebulizer solution use 1 vial in nebulizer every 4 hours as needed for wheezing or shortness of breath 270 mL 2  . atorvastatin (LIPITOR) 80 MG tablet TAKE ONE TABLET BY MOUTH ONE TIME DAILY  90 tablet 3  . azelastine (ASTELIN) 0.1 % nasal spray Place 2 sprays into both nostrils at bedtime. Use in each nostril as directed    . B Complex-C (SUPER B COMPLEX PO) Take 1 tablet by mouth at bedtime.     . cetirizine (ZYRTEC) 10 MG tablet Take 10 mg by mouth at bedtime.     . cholecalciferol (VITAMIN D) 1000 units tablet Take 1,000 Units by mouth daily.    Marland Kitchen doxycycline (VIBRA-TABS) 100 MG tablet Take 1 tablet (100 mg total) by mouth 2 (two) times daily. 14 tablet 0  . ELIQUIS 5 MG TABS tablet TAKE 1 TABLET BY MOUTH 2 TIMES DAILY 180 tablet 1  . escitalopram (LEXAPRO) 20 MG tablet Take 20 mg by mouth at bedtime.     . fenofibrate 160 MG tablet TAKE 1 TABLET BY MOUTH AT BEDTIME. 90 tablet 3  . furosemide (LASIX) 20 MG tablet TAKE 1 TABLET BY MOUTH DAILY 90 tablet 3  . hydrALAZINE (APRESOLINE) 10 MG tablet Take 1 tablet (10 mg total) by mouth 3 (three) times daily. 270 tablet 3  . ipratropium (ATROVENT) 0.03 % nasal spray PLACE 2 SPRAYS  INTO BOTH NOSTRILS AT BEDTIME. 90 mL 3  . lamoTRIgine (LAMICTAL) 150 MG tablet Take 150 mg by mouth at bedtime. Reported on 08/14/2015    . montelukast (SINGULAIR) 10 MG tablet Take 1 tablet (10 mg total) by mouth at bedtime. 30 tablet 11  . Multiple Vitamin (MULTIVITAMIN) tablet Take 1 tablet by mouth at bedtime.     . pantoprazole (PROTONIX) 40 MG tablet TAKE 1 TABLET BY MOUTH DAILY BEFORE BREAKFAST. 90 tablet 3  . Tiotropium Bromide-Olodaterol (STIOLTO RESPIMAT) 2.5-2.5 MCG/ACT AERS Inhale 2 puffs into the lungs daily. 12 Inhaler 3  . triamcinolone cream (KENALOG) 0.1 % Apply 1 application topically 2 (two) times daily as needed (for skin).     . Turmeric 500 MG TABS Take 2 tablets by mouth.    . verapamil (CALAN-SR) 240 MG CR tablet Take 1 tablet (240 mg total) by mouth at bedtime. 90 tablet 3   No current facility-administered medications on file prior to visit.     Allergies  Allergen Reactions  . Penicillins Itching    Has patient had a PCN reaction causing immediate rash, facial/tongue/throat swelling, SOB or lightheadedness with hypotension: Yes Has patient had a PCN reaction causing severe rash involving mucus membranes or skin necrosis: No Has patient had  a PCN reaction that required hospitalization No Has patient had a PCN reaction occurring within the last 10 years: No If all of the above answers are "NO", then may proceed with Cephalosporin use.   . Sulfonamide Derivatives Other (See Comments)    "it affected my kidneys"  . Tape Other (See Comments)    Arms black and blue, tears skin.  Please use "paper" tape    Past Medical History:  Diagnosis Date  . Allergy   . Anemia 2012, 08/2015   chronic. Acute due to large hematoma 2013. 2017: due to gastric AVM bleeding.   . Anxiety   . Atrial fibrillation ([place]) 2012   a.  CHA2DS2VASc = 6-->eliquis;  b. 12/2015 Echo: EF 60-65%, no rwma, LVH, mild AS/MS/MR, sev dil LA, mild TR, PASP 1mmHg.  Marland Kitchen CAD (coronary artery disease)    a.  06/2010 Cath: LM nl, LAD 50p/m, LCX 82m, RCA 50p/m -->catheter dissection but good flow, 70d, RPDA 50-70;  b. 06/2010 Relook cath due to bradycardia and inf ST elev: RCA dissection @ ostium extending into coronary cusp and prox RCA, Ao dissection-->less staining than previously-->Med Rx.  Marland Kitchen COPD (chronic obstructive pulmonary disease) ([place]) 2013   exertional dyspnea  . Gastric angiodysplasia with hemorrhage 08/2015  . Hx of colonic polyps 2001, 2011   adenomatous 2001. HP 2001, 2011.  Marland Kitchen Hyperlipidemia   . Myocardial infarction ([place]) 06/2010.  12/2011.   "twice; 1 day apart; during/after cath". NQMI in setting severe anemia 2013.   Marland Kitchen Neuropathy 2014   in feet, likely due to spinal stenosis, DDD, HNP  . Osteoarthritis 2002   spinal stenosis, spondylolisthesis, disc protrusion multilevel in lumbar spine  . Stroke ([place]) 2016  . Upper GI bleed 08/2015   EGD: 2 small gastric AVMs, 1 actively bleeding.  Both treated with APC ablation, clipping of the bleeder    Past Surgical History:  Procedure Laterality Date  . APPENDECTOMY    . CARDIAC CATHETERIZATION  2012   50% LAD and luminal irreg in RCA, 1/12  Post cath MI from damage  . CARPAL TUNNEL RELEASE     left  . CARPAL TUNNEL RELEASE  10/11/11   Dr Rush Barer  . CATARACT EXTRACTION W/ INTRAOCULAR LENS IMPLANT  2/13   Dr [name]  . COLONOSCOPY  2001, 02/2010   Dr Deatra Ina.   . ESOPHAGOGASTRODUODENOSCOPY N/A 08/15/2015   [name] MD; 2 small gastric AVMs, 1 actively bleeding.  Both treated with APC ablation, clipping of the bleeder  . FOOT FRACTURE SURGERY     right heel repair  . FRACTURE SURGERY    . KNEE ARTHROSCOPY     left, right knee 10/2009  . MASTOIDECTOMY     x3 on right  . NASAL SEPTUM SURGERY     nasal septal repair  . PENILE PROSTHESIS  REMOVAL    . PENILE PROSTHESIS IMPLANT     x 2--got infection after 2nd and had to remove  . PILONIDAL CYST / SINUS EXCISION    . SHOULDER ARTHROSCOPY     right  . TONSILLECTOMY  AND ADENOIDECTOMY      Family History  Problem Relation Age of Onset  . Cancer Mother        ? leukemia  . Heart attack Father   . Stroke Maternal Grandmother   . Diabetes Neg Hx   . Hypertension Neg Hx     Social History   Socioeconomic History  . Marital status: Married    Spouse  name: Not on file  . Number of children: 4  . Years of education: some college  . Highest education level: Not on file  Occupational History  . Occupation: Retired Chief Strategy Officer the job trainin)  Social Needs  . Financial resource strain: Not on file  . Food insecurity:    Worry: Not on file    Inability: Not on file  . Transportation needs:    Medical: Not on file    Non-medical: Not on file  Tobacco Use  . Smoking status: Former Smoker    Packs/day: 2.50    Years: 30.00    Pack years: 75.00    Types: Cigarettes    Last attempt to quit: 06/11/1978    Years since quitting: 39.3  . Smokeless tobacco: Never Used  Substance and Sexual Activity  . Alcohol use: No    Comment: [id number] alcoholic  . Drug use: No  . Sexual activity: Not Currently  Lifestyle  . Physical activity:    Days per week: Not on file    Minutes per session: Not on file  . Stress: Not on file  Relationships  . Social connections:    Talks on phone: Not on file    Gets together: Not on file    Attends religious service: Not on file    Active member of club or organization: Not on file    Attends meetings of clubs or organizations: Not on file    Relationship status: Not on file  . Intimate partner violence:    Fear of current or ex partner: Not on file    Emotionally abused: Not on file    Physically abused: Not on file    Forced sexual activity: Not on file  Other Topics Concern  . Not on file  Social History Narrative   Grew up in [place], [place].  Lives in [place].   Married---2nd   2 children ([place], [place]  and near [place])--2 stepchildren   Former Smoker--quit in 1980   Alcohol use-no.  Quit in 1980--alcoholic      Has living will.   Has designated wife, then step daughter [name] have health care POA.   Would accept resuscitation attempts.   No feeding tube if cognitively unaware.   Review of Systems  No fever Not sick No GI problems--eating okay     Objective:   Physical Exam  Constitutional: No distress.  Skin:  ~5cm vertical laceration Some contusion/tissue damage along the top  No sig scabbing as yet Very mild surrounding redness---not clearly infected now          Assessment & Plan:

## 2017-10-23 NOTE — Assessment & Plan Note (Signed)
Healing okay but slowly May have some necrosis of skin in upper portion--that will delay healing Not clearly infected now---will finish out the doxy Redressed with neosporin and telfa dressing Discussed care

## 2017-10-28 ENCOUNTER — Other Ambulatory Visit: Payer: Self-pay | Admitting: Interventional Cardiology

## 2017-10-29 ENCOUNTER — Encounter: Payer: Self-pay | Admitting: Internal Medicine

## 2017-10-29 ENCOUNTER — Ambulatory Visit (INDEPENDENT_AMBULATORY_CARE_PROVIDER_SITE_OTHER): Payer: PPO | Admitting: Internal Medicine

## 2017-10-29 VITALS — BP 138/74 | HR 70 | Temp 97.8°F | Ht 62.0 in | Wt 148.0 lb

## 2017-10-29 DIAGNOSIS — S81811D Laceration without foreign body, right lower leg, subsequent encounter: Secondary | ICD-10-CM

## 2017-10-29 NOTE — Assessment & Plan Note (Signed)
Improved Still not fully apposed Sutures removed with some difficulty Benzoin/steristrips Discussed home care

## 2017-10-29 NOTE — Progress Notes (Signed)
Subjective:    Patient ID: Eduardo [name], male    DOB: 04/04/1931, 84 y.o.   MRN: [id number]  HPI Here with wife For suture removal Looking better since wife stopped putting stuff on Not really painful--he feels it some when first getting on it  Current Outpatient Medications on File Prior to Visit  Medication Sig Dispense Refill  . acetaminophen (TYLENOL) 650 MG CR tablet Take 1,300 mg by mouth 2 (two) times daily.    Marland Kitchen albuterol (PROAIR HFA) 108 (90 Base) MCG/ACT inhaler Inhale 2 puffs into the lungs every 6 (six) hours as needed for wheezing or shortness of breath. 18 g 0  . albuterol (PROVENTIL) (2.5 MG/3ML) 0.083% nebulizer solution use 1 vial in nebulizer every 4 hours as needed for wheezing or shortness of breath 270 mL 2  . atorvastatin (LIPITOR) 80 MG tablet TAKE ONE TABLET BY MOUTH ONE TIME DAILY  90 tablet 3  . azelastine (ASTELIN) 0.1 % nasal spray Place 2 sprays into both nostrils at bedtime. Use in each nostril as directed    . B Complex-C (SUPER B COMPLEX PO) Take 1 tablet by mouth at bedtime.     . cetirizine (ZYRTEC) 10 MG tablet Take 10 mg by mouth at bedtime.     . cholecalciferol (VITAMIN D) 1000 units tablet Take 1,000 Units by mouth daily.    Marland Kitchen ELIQUIS 5 MG TABS tablet TAKE 1 TABLET BY MOUTH 2 TIMES DAILY 180 tablet 1  . escitalopram (LEXAPRO) 20 MG tablet Take 20 mg by mouth at bedtime.     . fenofibrate 160 MG tablet TAKE 1 TABLET BY MOUTH AT BEDTIME. 90 tablet 3  . furosemide (LASIX) 20 MG tablet TAKE 1 TABLET BY MOUTH DAILY 90 tablet 3  . hydrALAZINE (APRESOLINE) 10 MG tablet Take 1 tablet (10 mg total) by mouth 3 (three) times daily. 270 tablet 3  . ipratropium (ATROVENT) 0.03 % nasal spray PLACE 2 SPRAYS INTO BOTH NOSTRILS AT BEDTIME. 90 mL 3  . IRON PO Take by mouth.    . lamoTRIgine (LAMICTAL) 150 MG tablet Take 150 mg by mouth at bedtime. Reported on 08/14/2015    . montelukast (SINGULAIR) 10 MG tablet Take 1 tablet (10 mg total) by mouth at bedtime.  30 tablet 11  . Multiple Vitamin (MULTIVITAMIN) tablet Take 1 tablet by mouth at bedtime.     . pantoprazole (PROTONIX) 40 MG tablet TAKE 1 TABLET BY MOUTH DAILY BEFORE BREAKFAST. 90 tablet 3  . Tiotropium Bromide-Olodaterol (STIOLTO RESPIMAT) 2.5-2.5 MCG/ACT AERS Inhale 2 puffs into the lungs daily. 12 Inhaler 3  . triamcinolone cream (KENALOG) 0.1 % Apply 1 application topically 2 (two) times daily as needed (for skin).     . Turmeric 500 MG TABS Take 2 tablets by mouth.    . verapamil (CALAN-SR) 240 MG CR tablet Take 1 tablet (240 mg total) by mouth at bedtime. 90 tablet 3   No current facility-administered medications on file prior to visit.     Allergies  Allergen Reactions  . Penicillins Itching    Has patient had a PCN reaction causing immediate rash, facial/tongue/throat swelling, SOB or lightheadedness with hypotension: Yes Has patient had a PCN reaction causing severe rash involving mucus membranes or skin necrosis: No Has patient had a PCN reaction that required hospitalization No Has patient had a PCN reaction occurring within the last 10 years: No If all of the above answers are "NO", then may proceed with Cephalosporin use.   Marland Kitchen  Sulfonamide Derivatives Other (See Comments)    "it affected my kidneys"  . Tape Other (See Comments)    Arms black and blue, tears skin.  Please use "paper" tape    Past Medical History:  Diagnosis Date  . Allergy   . Anemia 2012, 08/2015   chronic. Acute due to large hematoma 2013. 2017: due to gastric AVM bleeding.   . Anxiety   . Atrial fibrillation ([place]) 2012   a.  CHA2DS2VASc = 6-->eliquis;  b. 12/2015 Echo: EF 60-65%, no rwma, LVH, mild AS/MS/MR, sev dil LA, mild TR, PASP 36mmHg.  Marland Kitchen CAD (coronary artery disease)    a. 06/2010 Cath: LM nl, LAD 50p/m, LCX 25m, RCA 50p/m -->catheter dissection but good flow, 70d, RPDA 50-70;  b. 06/2010 Relook cath due to bradycardia and inf ST elev: RCA dissection @ ostium extending into coronary cusp and prox  RCA, Ao dissection-->less staining than previously-->Med Rx.  Marland Kitchen COPD (chronic obstructive pulmonary disease) ([place]) 2013   exertional dyspnea  . Gastric angiodysplasia with hemorrhage 08/2015  . Hx of colonic polyps 2001, 2011   adenomatous 2001. HP 2001, 2011.  Marland Kitchen Hyperlipidemia   . Myocardial infarction ([place]) 06/2010.  12/2011.   "twice; 1 day apart; during/after cath". NQMI in setting severe anemia 2013.   Marland Kitchen Neuropathy 2014   in feet, likely due to spinal stenosis, DDD, HNP  . Osteoarthritis 2002   spinal stenosis, spondylolisthesis, disc protrusion multilevel in lumbar spine  . Stroke ([place]) 2016  . Upper GI bleed 08/2015   EGD: 2 small gastric AVMs, 1 actively bleeding.  Both treated with APC ablation, clipping of the bleeder    Past Surgical History:  Procedure Laterality Date  . APPENDECTOMY    . CARDIAC CATHETERIZATION  2012   50% LAD and luminal irreg in RCA, 1/12  Post cath MI from damage  . CARPAL TUNNEL RELEASE     left  . CARPAL TUNNEL RELEASE  10/11/11   Dr Rush Barer  . CATARACT EXTRACTION W/ INTRAOCULAR LENS IMPLANT  2/13   Dr [name]  . COLONOSCOPY  2001, 02/2010   Dr Deatra Ina.   . ESOPHAGOGASTRODUODENOSCOPY N/A 08/15/2015   [name] MD; 2 small gastric AVMs, 1 actively bleeding.  Both treated with APC ablation, clipping of the bleeder  . FOOT FRACTURE SURGERY     right heel repair  . FRACTURE SURGERY    . KNEE ARTHROSCOPY     left, right knee 10/2009  . MASTOIDECTOMY     x3 on right  . NASAL SEPTUM SURGERY     nasal septal repair  . PENILE PROSTHESIS  REMOVAL    . PENILE PROSTHESIS IMPLANT     x 2--got infection after 2nd and had to remove  . PILONIDAL CYST / SINUS EXCISION    . SHOULDER ARTHROSCOPY     right  . TONSILLECTOMY AND ADENOIDECTOMY      Family History  Problem Relation Age of Onset  . Cancer Mother        ? leukemia  . Heart attack Father   . Stroke Maternal Grandmother   . Diabetes Neg Hx   . Hypertension Neg Hx     Social  History   Socioeconomic History  . Marital status: Married    Spouse name: Not on file  . Number of children: 4  . Years of education: some college  . Highest education level: Not on file  Occupational History  . Occupation: Retired Chief Strategy Officer the job trainin)  Social Needs  . Financial resource strain: Not on file  . Food insecurity:    Worry: Not on file    Inability: Not on file  . Transportation needs:    Medical: Not on file    Non-medical: Not on file  Tobacco Use  . Smoking status: Former Smoker    Packs/day: 2.50    Years: 30.00    Pack years: 75.00    Types: Cigarettes    Last attempt to quit: 06/11/1978    Years since quitting: 39.4  . Smokeless tobacco: Never Used  Substance and Sexual Activity  . Alcohol use: No    Comment: [id number] [id number] alcoholic  . Drug use: No  . Sexual activity: Not Currently  Lifestyle  . Physical activity:    Days per week: Not on file    Minutes per session: Not on file  . Stress: Not on file  Relationships  . Social connections:    Talks on phone: Not on file    Gets together: Not on file    Attends religious service: Not on file    Active member of club or organization: Not on file    Attends meetings of clubs or organizations: Not on file    Relationship status: Not on file  . Intimate partner violence:    Fear of current or ex partner: Not on file    Emotionally abused: Not on file    Physically abused: Not on file    Forced sexual activity: Not on file  Other Topics Concern  . Not on file  Social History Narrative   Grew up in [place], [place].  Lives in [place].   Married---2nd   2 children ([place], [place]  and near [place])--2 stepchildren   Former Smoker--quit in 1980   Alcohol use-no. Quit in 1980--alcoholic      Has living will.   Has designated wife, then step daughter [name] have health care POA.   Would accept resuscitation attempts.   No feeding tube if cognitively unaware.   Review of  Systems  No fever Not feeling sick     Objective:   Physical Exam  Skin:  Wound still not well apposed  Necrotic looking area at upper portion is now looking viable Not infected          Assessment & Plan:

## 2017-11-04 ENCOUNTER — Encounter: Payer: Self-pay | Admitting: Internal Medicine

## 2017-11-05 ENCOUNTER — Ambulatory Visit (INDEPENDENT_AMBULATORY_CARE_PROVIDER_SITE_OTHER): Payer: PPO | Admitting: Internal Medicine

## 2017-11-05 ENCOUNTER — Encounter: Payer: Self-pay | Admitting: Internal Medicine

## 2017-11-05 VITALS — BP 132/78 | HR 74 | Temp 97.6°F | Wt 152.0 lb

## 2017-11-05 DIAGNOSIS — S81801D Unspecified open wound, right lower leg, subsequent encounter: Secondary | ICD-10-CM | POA: Diagnosis not present

## 2017-11-05 NOTE — Progress Notes (Signed)
Subjective:    Patient ID: Eduardo [name], male    DOB: Mar 31, 1931, 84 y.o.   MRN: [id number]  HPI  Pt presents to the clinic today to follow up laceration to right lower leg.  5/7: sustained laceration, went to UC. Stitches placed.  5/15: Follow up with PCP. Wife concerned about redness. Wife gave him leftover Doxycycline. Dr. [name] advised to continue Doxycycline, Neosporin and Tefla.  5/21: Follow up with PCP. Stitches removed. Steri strips applied.  Pt reports the wound reopened the same day the stitches were removed. He reports redness, pain and drainage from the site.  Review of Systems      Past Medical History:  Diagnosis Date  . Allergy   . Anemia 2012, 08/2015   chronic. Acute due to large hematoma 2013. 2017: due to gastric AVM bleeding.   . Anxiety   . Atrial fibrillation ([place]) 2012   a.  CHA2DS2VASc = 6-->eliquis;  b. 12/2015 Echo: EF 60-65%, no rwma, LVH, mild AS/MS/MR, sev dil LA, mild TR, PASP 103mmHg.  Marland Kitchen CAD (coronary artery disease)    a. 06/2010 Cath: LM nl, LAD 50p/m, LCX 71m, RCA 50p/m -->catheter dissection but good flow, 70d, RPDA 50-70;  b. 06/2010 Relook cath due to bradycardia and inf ST elev: RCA dissection @ ostium extending into coronary cusp and prox RCA, Ao dissection-->less staining than previously-->Med Rx.  Marland Kitchen COPD (chronic obstructive pulmonary disease) ([place]) 2013   exertional dyspnea  . Gastric angiodysplasia with hemorrhage 08/2015  . Hx of colonic polyps 2001, 2011   adenomatous 2001. HP 2001, 2011.  Marland Kitchen Hyperlipidemia   . Myocardial infarction ([place]) 06/2010.  12/2011.   "twice; 1 day apart; during/after cath". NQMI in setting severe anemia 2013.   Marland Kitchen Neuropathy 2014   in feet, likely due to spinal stenosis, DDD, HNP  . Osteoarthritis 2002   spinal stenosis, spondylolisthesis, disc protrusion multilevel in lumbar spine  . Stroke ([place]) 2016  . Upper GI bleed 08/2015   EGD: 2 small gastric AVMs, 1 actively bleeding.  Both treated with APC  ablation, clipping of the bleeder    Current Outpatient Medications  Medication Sig Dispense Refill  . acetaminophen (TYLENOL) 650 MG CR tablet Take 1,300 mg by mouth 2 (two) times daily.    Marland Kitchen albuterol (PROAIR HFA) 108 (90 Base) MCG/ACT inhaler Inhale 2 puffs into the lungs every 6 (six) hours as needed for wheezing or shortness of breath. 18 g 0  . albuterol (PROVENTIL) (2.5 MG/3ML) 0.083% nebulizer solution use 1 vial in nebulizer every 4 hours as needed for wheezing or shortness of breath 270 mL 2  . atorvastatin (LIPITOR) 80 MG tablet TAKE ONE TABLET BY MOUTH ONE TIME DAILY  90 tablet 3  . azelastine (ASTELIN) 0.1 % nasal spray Place 2 sprays into both nostrils at bedtime. Use in each nostril as directed    . B Complex-C (SUPER B COMPLEX PO) Take 1 tablet by mouth at bedtime.     . cetirizine (ZYRTEC) 10 MG tablet Take 10 mg by mouth at bedtime.     . cholecalciferol (VITAMIN D) 1000 units tablet Take 1,000 Units by mouth daily.    Marland Kitchen ELIQUIS 5 MG TABS tablet TAKE 1 TABLET BY MOUTH 2 TIMES DAILY 180 tablet 1  . escitalopram (LEXAPRO) 20 MG tablet Take 20 mg by mouth at bedtime.     . fenofibrate 160 MG tablet TAKE 1 TABLET BY MOUTH AT BEDTIME. 90 tablet 3  . furosemide (LASIX) 20  MG tablet TAKE 1 TABLET BY MOUTH DAILY 90 tablet 3  . hydrALAZINE (APRESOLINE) 10 MG tablet Take 1 tablet (10 mg total) by mouth 3 (three) times daily. 270 tablet 3  . ipratropium (ATROVENT) 0.03 % nasal spray PLACE 2 SPRAYS INTO BOTH NOSTRILS AT BEDTIME. 90 mL 3  . IRON PO Take by mouth.    . lamoTRIgine (LAMICTAL) 150 MG tablet Take 150 mg by mouth at bedtime. Reported on 08/14/2015    . montelukast (SINGULAIR) 10 MG tablet Take 1 tablet (10 mg total) by mouth at bedtime. 30 tablet 11  . Multiple Vitamin (MULTIVITAMIN) tablet Take 1 tablet by mouth at bedtime.     . pantoprazole (PROTONIX) 40 MG tablet TAKE 1 TABLET BY MOUTH DAILY BEFORE BREAKFAST. 90 tablet 3  . Tiotropium Bromide-Olodaterol (STIOLTO RESPIMAT)  2.5-2.5 MCG/ACT AERS Inhale 2 puffs into the lungs daily. 12 Inhaler 3  . triamcinolone cream (KENALOG) 0.1 % Apply 1 application topically 2 (two) times daily as needed (for skin).     . Turmeric 500 MG TABS Take 2 tablets by mouth.    . verapamil (CALAN-SR) 240 MG CR tablet TAKE 1 TABLET BY MOUTH AT BEDTIME 90 tablet 1   No current facility-administered medications for this visit.     Allergies  Allergen Reactions  . Penicillins Itching    Has patient had a PCN reaction causing immediate rash, facial/tongue/throat swelling, SOB or lightheadedness with hypotension: Yes Has patient had a PCN reaction causing severe rash involving mucus membranes or skin necrosis: No Has patient had a PCN reaction that required hospitalization No Has patient had a PCN reaction occurring within the last 10 years: No If all of the above answers are "NO", then may proceed with Cephalosporin use.   . Sulfonamide Derivatives Other (See Comments)    "it affected my kidneys"  . Tape Other (See Comments)    Arms black and blue, tears skin.  Please use "paper" tape    Family History  Problem Relation Age of Onset  . Cancer Mother        ? leukemia  . Heart attack Father   . Stroke Maternal Grandmother   . Diabetes Neg Hx   . Hypertension Neg Hx     Social History   Socioeconomic History  . Marital status: Married    Spouse name: Not on file  . Number of children: 4  . Years of education: some college  . Highest education level: Not on file  Occupational History  . Occupation: Retired Chief Strategy Officer the job trainin)  Social Needs  . Financial resource strain: Not on file  . Food insecurity:    Worry: Not on file    Inability: Not on file  . Transportation needs:    Medical: Not on file    Non-medical: Not on file  Tobacco Use  . Smoking status: Former Smoker    Packs/day: 2.50    Years: 30.00    Pack years: 75.00    Types: Cigarettes    Last attempt to quit: 06/11/1978    Years  since quitting: 39.4  . Smokeless tobacco: Never Used  Substance and Sexual Activity  . Alcohol use: No    Comment: QUIT [id number] alcoholic  . Drug use: No  . Sexual activity: Not Currently  Lifestyle  . Physical activity:    Days per week: Not on file    Minutes per session: Not on file  . Stress: Not on file  Relationships  .  Social connections:    Talks on phone: Not on file    Gets together: Not on file    Attends religious service: Not on file    Active member of club or organization: Not on file    Attends meetings of clubs or organizations: Not on file    Relationship status: Not on file  . Intimate partner violence:    Fear of current or ex partner: Not on file    Emotionally abused: Not on file    Physically abused: Not on file    Forced sexual activity: Not on file  Other Topics Concern  . Not on file  Social History Narrative   Grew up in [place], [place].  Lives in [place].   Married---2nd   2 children ([place], [place]  and near [place])--2 stepchildren   Former Smoker--quit in 1980   Alcohol use-no. Quit in 1980--alcoholic      Has living will.   Has designated wife, then step daughter [name] have health care POA.   Would accept resuscitation attempts.   No feeding tube if cognitively unaware.     Constitutional: Denies fever, malaise, fatigue, headache or abrupt weight changes.  Skin: Pt reports laceration to right lower leg with redness, drainage and pain. Denies rashes, lesions.    No other specific complaints in a complete review of systems (except as listed in HPI above).  Objective:   Physical Exam   BP 132/78   Pulse 74   Temp 97.6 F (36.4 C) (Oral)   Wt 152 lb (68.9 kg)   SpO2 97%   BMI 27.80 kg/m  Wt Readings from Last 3 Encounters:  11/05/17 152 lb (68.9 kg)  10/29/17 148 lb (67.1 kg)  10/23/17 149 lb (67.6 kg)    General: Appears his stated age, well developed, well nourished in NAD. Skin: 2.5 x 1 cm open wound to right  posterior calf. No s/s of infection. Cardiovascular: 1+ trace pitting edema of RLE. Musculoskeletal: Gait slow and steady with use of cane. Neurological: Alert and oriented.    BMET    Component Value Date/Time   NA 141 07/01/2017 1218   NA 143 08/08/2016 0847   NA 142 09/08/2011 0956   K 4.3 07/01/2017 1218   K 3.9 09/08/2011 0956   CL 106 07/01/2017 1218   CL 105 09/08/2011 0956   CO2 26 07/01/2017 1218   CO2 26 09/08/2011 0956   GLUCOSE 83 07/01/2017 1218   GLUCOSE 97 09/08/2011 0956   BUN 35 (H) 07/01/2017 1218   BUN 25 08/08/2016 0847   BUN 20 (H) 09/08/2011 0956   CREATININE 1.21 07/01/2017 1218   CREATININE 1.01 08/22/2015 0957   CALCIUM 9.9 07/01/2017 1218   CALCIUM 9.0 09/08/2011 0956   GFRNONAA 53 (L) 07/01/2017 1218   GFRNONAA >60 09/08/2011 0956   GFRAA >60 07/01/2017 1218   GFRAA >60 09/08/2011 0956    Lipid Panel     Component Value Date/Time   CHOL 132 06/24/2017 1709   TRIG (H) 06/24/2017 1709    1031.0 Triglyceride is over 400; calculations on Lipids are invalid.   HDL 44.80 06/24/2017 1709   CHOLHDL 3 06/24/2017 1709   VLDL UNABLE TO CALCULATE IF TRIGLYCERIDE OVER 400 mg/dL 12/31/2014 0510   LDLCALC UNABLE TO CALCULATE IF TRIGLYCERIDE OVER 400 mg/dL 12/31/2014 0510    CBC    Component Value Date/Time   WBC 7.0 07/01/2017 1218   WBC 6.8 06/24/2017 1709   RBC 3.56 (L)  07/01/2017 1218   HGB 10.2 (L) 07/01/2017 1218   HGB 11.4 (L) 09/08/2011 0956   HCT 32.5 (L) 07/01/2017 1218   HCT 35.4 (L) 09/08/2011 0956   PLT 175 07/01/2017 1218   PLT 203 09/08/2011 0956   MCV 91.3 07/01/2017 1218   MCV 85 09/08/2011 0956   MCH 28.7 07/01/2017 1218   MCHC 31.4 (L) 07/01/2017 1218   RDW 15.7 (H) 07/01/2017 1218   RDW 14.7 (H) 09/08/2011 0956   LYMPHSABS 1.3 07/01/2017 1218   MONOABS 0.5 07/01/2017 1218   EOSABS 0.3 07/01/2017 1218   BASOSABS 0.0 07/01/2017 1218    Hgb A1C Lab Results  Component Value Date   HGBA1C 5.4 07/18/2016            Assessment & Plan:   Open Wound of RLE:  Wound culture today- will notify you of the results Encouraged elevation to help reduce swelling as he is not able to wear his TED hose Wound cleansed with sterile saline Wound pack with wet gauze, covered with Tefla and [place] Wound care instructions given to wife Encouraged to monitor for increased redness, drainage with foul odor  Return precautions discussed Webb Silversmith, NP

## 2017-11-05 NOTE — Addendum Note (Signed)
Addended by: Lurlean Nanny on: 11/05/2017 04:47 PM   Modules accepted: Orders

## 2017-11-05 NOTE — Patient Instructions (Signed)

## 2017-11-06 ENCOUNTER — Encounter: Payer: Self-pay | Admitting: Internal Medicine

## 2017-11-06 DIAGNOSIS — L97919 Non-pressure chronic ulcer of unspecified part of right lower leg with unspecified severity: Secondary | ICD-10-CM

## 2017-11-08 ENCOUNTER — Other Ambulatory Visit: Payer: Self-pay | Admitting: Internal Medicine

## 2017-11-08 ENCOUNTER — Encounter: Payer: PPO | Attending: Physician Assistant | Admitting: Physician Assistant

## 2017-11-08 DIAGNOSIS — S81801A Unspecified open wound, right lower leg, initial encounter: Secondary | ICD-10-CM | POA: Diagnosis not present

## 2017-11-08 DIAGNOSIS — Z882 Allergy status to sulfonamides status: Secondary | ICD-10-CM | POA: Diagnosis not present

## 2017-11-08 DIAGNOSIS — Z87891 Personal history of nicotine dependence: Secondary | ICD-10-CM | POA: Diagnosis not present

## 2017-11-08 DIAGNOSIS — I480 Paroxysmal atrial fibrillation: Secondary | ICD-10-CM | POA: Insufficient documentation

## 2017-11-08 DIAGNOSIS — Z823 Family history of stroke: Secondary | ICD-10-CM | POA: Diagnosis not present

## 2017-11-08 DIAGNOSIS — X58XXXA Exposure to other specified factors, initial encounter: Secondary | ICD-10-CM | POA: Insufficient documentation

## 2017-11-08 DIAGNOSIS — Z8673 Personal history of transient ischemic attack (TIA), and cerebral infarction without residual deficits: Secondary | ICD-10-CM | POA: Insufficient documentation

## 2017-11-08 DIAGNOSIS — Z8249 Family history of ischemic heart disease and other diseases of the circulatory system: Secondary | ICD-10-CM | POA: Diagnosis not present

## 2017-11-08 DIAGNOSIS — G629 Polyneuropathy, unspecified: Secondary | ICD-10-CM | POA: Diagnosis not present

## 2017-11-08 DIAGNOSIS — H409 Unspecified glaucoma: Secondary | ICD-10-CM | POA: Insufficient documentation

## 2017-11-08 DIAGNOSIS — I2510 Atherosclerotic heart disease of native coronary artery without angina pectoris: Secondary | ICD-10-CM | POA: Diagnosis not present

## 2017-11-08 DIAGNOSIS — I252 Old myocardial infarction: Secondary | ICD-10-CM | POA: Diagnosis not present

## 2017-11-08 DIAGNOSIS — I509 Heart failure, unspecified: Secondary | ICD-10-CM | POA: Diagnosis not present

## 2017-11-08 DIAGNOSIS — Z7901 Long term (current) use of anticoagulants: Secondary | ICD-10-CM | POA: Insufficient documentation

## 2017-11-08 DIAGNOSIS — I679 Cerebrovascular disease, unspecified: Secondary | ICD-10-CM | POA: Diagnosis not present

## 2017-11-08 DIAGNOSIS — Z880 Allergy status to penicillin: Secondary | ICD-10-CM | POA: Diagnosis not present

## 2017-11-08 DIAGNOSIS — L97812 Non-pressure chronic ulcer of other part of right lower leg with fat layer exposed: Secondary | ICD-10-CM | POA: Diagnosis not present

## 2017-11-08 DIAGNOSIS — J449 Chronic obstructive pulmonary disease, unspecified: Secondary | ICD-10-CM | POA: Diagnosis not present

## 2017-11-08 LAB — WOUND CULTURE
MICRO NUMBER:: 90640585
SPECIMEN QUALITY:: ADEQUATE

## 2017-11-08 MED ORDER — LEVOFLOXACIN 250 MG PO TABS: 250.0000 mg | ORAL_TABLET | Freq: Every day | ORAL | 0 refills | Status: DC

## 2017-11-10 NOTE — Progress Notes (Signed)
Eduardo [name], Eduardo [name]. ([id number]) Visit Report for 11/08/2017 Abuse/Suicide Risk Screen Details Patient Name: Eduardo [name], Eduardo [name]. Date of Service: 11/08/2017 9:45 AM Medical Record Number: [id number] Patient Account Number: [id number] Date of Birth/Sex: 03/03/1931 (84 y.o. M) Treating RN: Cornell Barman Primary Care Eisley Barber: [name] Other Clinician: Referring Oluwaseyi Tull: [name] Treating Tydarius Yawn/Extender: Melburn Hake, HOYT Weeks in Treatment: 0 Abuse/Suicide Risk Screen Items Answer ABUSE/SUICIDE RISK SCREEN: Has anyone close to you tried to hurt or harm you recentlyo No Do you feel uncomfortable with anyone in your familyo No Has anyone forced you do things that you didnot want to doo No Do you have any thoughts of harming yourselfo No Patient displays signs or symptoms of abuse and/or neglect. No Electronic Signature(s) Signed: 11/08/2017 5:30:19 PM By: Gretta Cool, BSN, RN, CWS, Kim RN, BSN Entered By: Gretta Cool, BSN, RN, CWS, Kim on 11/08/2017 10:07:55 Ringgold, Calla Kicks ([id number]) -------------------------------------------------------------------------------- Activities of Daily Living Details Patient Name: Eduardo [name], Eduardo [name]. Date of Service: 11/08/2017 9:45 AM Medical Record Number: [id number] Patient Account Number: [id number] Date of Birth/Sex: 03/11/1931 (84 y.o. M) Treating RN: Cornell Barman Primary Care Lovelle Deitrick: [name] Other Clinician: Referring Jacier Gladu: [name] Treating Rayla Pember/Extender: Melburn Hake, HOYT Weeks in Treatment: 0 Activities of Daily Living Items Answer Activities of Daily Living (Please select one for each item) Drive Automobile Completely Able Take Medications Completely Able Use Telephone Completely Able Care for Appearance Completely Able Use Toilet Completely Able Bath / Shower Completely Able Dress Self Completely Able Feed Self Completely Able Walk Completely Able Get In / Out Bed Completely Able Housework Completely  Able Prepare Meals Completely Able Handle Money Completely Able Shop for Self Completely Able Electronic Signature(s) Signed: 11/08/2017 5:30:19 PM By: Gretta Cool, BSN, RN, CWS, Kim RN, BSN Entered By: Gretta Cool, BSN, RN, CWS, Kim on 11/08/2017 10:08:05 Franklyn, Calla Kicks ([id number]) -------------------------------------------------------------------------------- Education Assessment Details Patient Name: Eduardo [name]. Date of Service: 11/08/2017 9:45 AM Medical Record Number: [id number] Patient Account Number: [id number] Date of Birth/Sex: 05-13-1931 (84 y.o. M) Treating RN: Cornell Barman Primary Care Corbet Hanley: [name] Other Clinician: Referring Zaneta Lightcap: [name] Treating Kiing Deakin/Extender: Melburn Hake, HOYT Weeks in Treatment: 0 Primary Learner Assessed: Patient Learning Preferences/Education Level/Primary Language Learning Preference: Explanation, Demonstration Highest Education Level: College or Above Preferred Language: English Cognitive Barrier Assessment/Beliefs Language Barrier: No Translator Needed: No Memory Deficit: No Emotional Barrier: No Cultural/Religious Beliefs Affecting Medical Care: No Physical Barrier Assessment Impaired Vision: Yes Glasses, readers Impaired Hearing: No Decreased Hand dexterity: No Knowledge/Comprehension Assessment Knowledge Level: Medium Comprehension Level: Medium Ability to understand written Medium instructions: Ability to understand verbal Medium instructions: Motivation Assessment Anxiety Level: Calm Cooperation: Cooperative Education Importance: Acknowledges Need Interest in Health Problems: Asks Questions Perception: Coherent Willingness to Engage in Self- High Management Activities: Readiness to Engage in Self- High Management Activities: Electronic Signature(s) Signed: 11/08/2017 5:30:19 PM By: Gretta Cool, BSN, RN, CWS, Kim RN, BSN Entered By: Gretta Cool, BSN, RN, CWS, Kim on 11/08/2017 10:08:46 Karle, Kaiden  Jerilynn Mages ([id number]) -------------------------------------------------------------------------------- Fall Risk Assessment Details Patient Name: Eduardo [name]. Date of Service: 11/08/2017 9:45 AM Medical Record Number: [id number] Patient Account Number: [id number] Date of Birth/Sex: 1931/04/21 (84 y.o. M) Treating RN: Cornell Barman Primary Care Yandiel Bergum: [name] Other Clinician: Referring Joniel Graumann: [name] Treating Pedro Oldenburg/Extender: Melburn Hake, HOYT Weeks in Treatment: 0 Fall Risk Assessment Items Have you had 2 or more falls in the last 12 monthso 0 Yes Have you had any fall that resulted in injury in the last 12 monthso 0 Yes FALL RISK  ASSESSMENT: History of falling - immediate or within 3 months 25 Yes Secondary diagnosis 0 No Ambulatory aid None/bed rest/wheelchair/nurse 0 No Crutches/cane/walker 15 Yes Furniture 0 No IV Access/Saline Lock 0 No Gait/Training Normal/bed rest/immobile 0 No Weak 10 Yes Impaired 0 No Mental Status Oriented to own ability 0 Yes Electronic Signature(s) Signed: 11/08/2017 5:30:19 PM By: Gretta Cool, BSN, RN, CWS, Kim RN, BSN Entered By: Gretta Cool, BSN, RN, CWS, Kim on 11/08/2017 10:09:10 Deane, Dandy Jerilynn Mages ([id number]) -------------------------------------------------------------------------------- Foot Assessment Details Patient Name: Eduardo [name]. Date of Service: 11/08/2017 9:45 AM Medical Record Number: [id number] Patient Account Number: [id number] Date of Birth/Sex: 02/18/1931 (84 y.o. M) Treating RN: Cornell Barman Primary Care Almira Phetteplace: [name] Other Clinician: Referring Audriella Blakeley: [name] Treating Christie Viscomi/Extender: Melburn Hake, HOYT Weeks in Treatment: 0 Foot Assessment Items Site Locations + = Sensation present, - = Sensation absent, C = Callus, U = Ulcer R = Redness, W = Warmth, M = Maceration, PU = Pre-ulcerative lesion F = Fissure, S = Swelling, D = Dryness Assessment Right: Left: Other Deformity: No  No Prior Foot Ulcer: No No Prior Amputation: No No Charcot Joint: No No Ambulatory Status: Ambulatory With Help Assistance Device: Walker Gait: Administrator, arts) Signed: 11/08/2017 5:30:19 PM By: Gretta Cool, BSN, RN, CWS, Kim RN, BSN Entered By: Gretta Cool, BSN, RN, CWS, Kim on 11/08/2017 10:13:10 Morozov, Calla Kicks ([id number]) -------------------------------------------------------------------------------- Nutrition Risk Assessment Details Patient Name: Eduardo [name]. Date of Service: 11/08/2017 9:45 AM Medical Record Number: [id number] Patient Account Number: [id number] Date of Birth/Sex: [birth year] (84 y.o. M) Treating RN: Cornell Barman Primary Care Romell Wolden: [name] Other Clinician: Referring Jaqualin Serpa: [name] Treating Geroldine Esquivias/Extender: Melburn Hake, HOYT Weeks in Treatment: 0 Height (in): 62 Weight (lbs): 153.7 Body Mass Index (BMI): 28.1 Nutrition Risk Assessment Items NUTRITION RISK SCREEN: I have an illness or condition that made me change the kind and/or amount of 0 No food I eat I eat fewer than two meals per day 0 No I eat few fruits and vegetables, or milk products 0 No I have three or more drinks of beer, liquor or wine almost every day 0 No I have tooth or mouth problems that make it hard for me to eat 0 No I don't always have enough money to buy the food I need 0 No I eat alone most of the time 0 No I take three or more different prescribed or over-the-counter drugs a day 1 Yes Without wanting to, I have lost or gained 10 pounds in the last six months 0 No I am not always physically able to shop, cook and/or feed myself 0 No Nutrition Protocols Good Risk Protocol 0 No interventions needed Moderate Risk Protocol Electronic Signature(s) Signed: 11/08/2017 5:30:19 PM By: Gretta Cool, BSN, RN, CWS, Kim RN, BSN Entered By: Gretta Cool, BSN, RN, CWS, Kim on 11/08/2017 10:09:31

## 2017-11-10 NOTE — Progress Notes (Signed)
KODEY, XUE ([id number]) Visit Report for 11/08/2017 Allergy List Details Patient Name: Eduardo [name], Eduardo [name]. Date of Service: 11/08/2017 9:45 AM Medical Record Number: [id number] Patient Account Number: [id number] Date of Birth/Sex: 04-Dec-1930 (84 y.o. M) Treating RN: Cornell Barman Primary Care Roby Spalla: [name] Other Clinician: Referring Cherissa Hook: [name] Treating Maanasa Aderhold/Extender: Melburn Hake, HOYT Weeks in Treatment: 0 Allergies Active Allergies penicillin Reaction: hives Sulfa (Sulfonamide Antibiotics) Reaction: interferes with Kidneys Allergy Notes Electronic Signature(s) Signed: 11/08/2017 5:30:19 PM By: Gretta Cool, BSN, RN, CWS, Kim RN, BSN Entered By: Gretta Cool, BSN, RN, CWS, Kim on 11/08/2017 09:58:09 Eduardo [name], Eduardo [name] ([id number]) -------------------------------------------------------------------------------- Arrival Information Details Patient Name: Eduardo [name]. Date of Service: 11/08/2017 9:45 AM Medical Record Number: [id number] Patient Account Number: [id number] Date of Birth/Sex: 1931-05-07 (84 y.o. M) Treating RN: Cornell Barman Primary Care Yorley Buch: [name] Other Clinician: Referring Patricie Geeslin: [name] Treating Aurther Harlin/Extender: Melburn Hake, HOYT Weeks in Treatment: 0 Visit Information Patient Arrived: Lyndel Pleasure Time: 09:53 Accompanied By: wife Transfer Assistance: None Patient Identification Verified: Yes Secondary Verification Process Yes Completed: Patient Has Alerts: Yes Patient Alerts: Patient on Blood Thinner Eliquis 11/08/17 ABI >220 Biateral Electronic Signature(s) Signed: 11/08/2017 5:45:37 PM By: [name] Entered By: [name] on 11/08/2017 11:02:15 Rohe, Jael Jerilynn [name] ([id number]) -------------------------------------------------------------------------------- Clinic Level of Care Assessment Details Patient Name: Eduardo [name]. Date of Service: 11/08/2017 9:45 AM Medical Record Number:  [id number] Patient Account Number: [id number] Date of Birth/Sex: 12/12/1930 (84 y.o. M) Treating RN: [name] Primary Care Aulton Routt: [name] Other Clinician: Referring Tyson Masin: [name] Treating Quinterius Gaida/Extender: Melburn Hake, HOYT Weeks in Treatment: 0 Clinic Level of Care Assessment Items TOOL 1 Quantity Score []  - Use when EandM and Procedure is performed on INITIAL visit 0 ASSESSMENTS - Nursing Assessment / Reassessment X - General Physical Exam (combine w/ comprehensive assessment (listed just below) when 1 20 performed on new pt. evals) X- 1 25 Comprehensive Assessment (HX, ROS, Risk Assessments, Wounds Hx, etc.) ASSESSMENTS - Wound and Skin Assessment / Reassessment []  - Dermatologic / Skin Assessment (not related to wound area) 0 ASSESSMENTS - Ostomy and/or Continence Assessment and Care []  - Incontinence Assessment and Management 0 []  - 0 Ostomy Care Assessment and Management (repouching, etc.) PROCESS - Coordination of Care X - Simple Patient / Family Education for ongoing care 1 15 []  - 0 Complex (extensive) Patient / Family Education for ongoing care X- 1 10 Staff obtains Programmer, systems, Records, Test Results / Process Orders []  - 0 Staff telephones HHA, Nursing Homes / Clarify orders / etc []  - 0 Routine Transfer to another Facility (non-emergent condition) []  - 0 Routine Hospital Admission (non-emergent condition) X- 1 15 New Admissions / Biomedical engineer / Ordering NPWT, Apligraf, etc. []  - 0 Emergency Hospital Admission (emergent condition) PROCESS - Special Needs []  - Pediatric / Minor Patient Management 0 []  - 0 Isolation Patient Management []  - 0 Hearing / Language / Visual special needs []  - 0 Assessment of Community assistance (transportation, D/C planning, etc.) []  - 0 Additional assistance / Altered mentation []  - 0 Support Surface(s) Assessment (bed, cushion, seat, etc.) Eduardo [name], [name] ([id number]) INTERVENTIONS -  Miscellaneous []  - External ear exam 0 []  - 0 Patient Transfer (multiple staff / Civil Service fast streamer / Similar devices) []  - 0 Simple Staple / Suture removal (25 or less) []  - 0 Complex Staple / Suture removal (26 or more) []  - 0 Hypo/Hyperglycemic Management (do not check if billed separately) X- 1 15 Ankle / Brachial Index (ABI) - do  not check if billed separately Has the patient been seen at the hospital within the last three years: Yes Total Score: 100 Level Of Care: New/Established - Level 3 Electronic Signature(s) Signed: 11/08/2017 5:45:37 PM By: [name] Entered By: [name] on 11/08/2017 11:18:25 Eduardo [name], [name] ([id number]) -------------------------------------------------------------------------------- Lower Extremity Assessment Details Patient Name: Eduardo [name]. Date of Service: 11/08/2017 9:45 AM Medical Record Number: [id number] Patient Account Number: [id number] Date of Birth/Sex: January 10, 1931 (84 y.o. M) Treating RN: Cornell Barman Primary Care Jaklyn Alen: [name] Other Clinician: Referring Vibhav Waddill: [name] Treating Mitsuye Schrodt/Extender: Melburn Hake, HOYT Weeks in Treatment: 0 Edema Assessment Assessed: [Left: No] [Right: No] [Left: Edema] [Right: :] Calf Left: Right: Point of Measurement: 30 cm From Medial Instep 29 cm 33.5 cm Ankle Left: Right: Point of Measurement: 10 cm From Medial Instep 24.3 cm 23.9 cm Vascular Assessment Pulses: Dorsalis Pedis Palpable: [Left:Yes] [Right:Yes] Posterior Tibial Palpable: [Left:Yes] [Right:Yes] Extremity colors, hair growth, and conditions: Extremity Color: [Left:Normal] [Right:Red] Hair Growth on Extremity: [Left:Yes] Temperature of Extremity: [Left:Cool] [Right:Cool] Capillary Refill: [Left:< 3 seconds] [Right:< 3 seconds] Lipodermatosclerosis: [Left:No] [Right:No] Toe Nail Assessment Left: Right: Thick: No No Discolored: No No Deformed: No No Improper Length and Hygiene: No No Notes ABI  >220 bilateral Electronic Signature(s) Signed: 11/08/2017 5:30:19 PM By: Gretta Cool, BSN, RN, CWS, Kim RN, BSN Entered By: Gretta Cool, BSN, RN, CWS, Kim on 11/08/2017 10:27:27 Rimmer, Halley Jerilynn [name] ([id number]) -------------------------------------------------------------------------------- Multi Wound Chart Details Patient Name: Eduardo [name]. Date of Service: 11/08/2017 9:45 AM Medical Record Number: [id number] Patient Account Number: [id number] Date of Birth/Sex: 1930-12-01 (84 y.o. M) Treating RN: [name] Primary Care Jaslynne Dahan: [name] Other Clinician: Referring Jazara Swiney: [name] Treating Laval Cafaro/Extender: Melburn Hake, HOYT Weeks in Treatment: 0 Vital Signs Height(in): 62 Pulse(bpm): 72 Weight(lbs): 153.7 Blood Pressure(mmHg): 129/56 Body Mass Index(BMI): 28 Temperature(F): 98.0 Respiratory Rate 16 (breaths/min): Photos: [1:No Photos] [N/A:N/A] Wound Location: [1:Right Lower Leg] [N/A:N/A] Wounding Event: [1:Trauma] [N/A:N/A] Primary Etiology: [1:Trauma, Other] [N/A:N/A] Comorbid History: [1:Glaucoma, Chronic Obstructive Pulmonary Disease (COPD), Arrhythmia, Congestive Heart Failure, Coronary Artery Disease, Myocardial Infarction, Neuropathy] [N/A:N/A] Date Acquired: [1:10/15/2017] [N/A:N/A] Weeks of Treatment: [1:0] [N/A:N/A] Wound Status: [1:Open] [N/A:N/A] Measurements L x W x D [1:3.2x2x0.4] [N/A:N/A] (cm) Area (cm) : [1:5.027] [N/A:N/A] Volume (cm) : [1:2.011] [N/A:N/A] % Reduction in Area: [1:0.00%] [N/A:N/A] % Reduction in Volume: [1:0.00%] [N/A:N/A] Classification: [1:Full Thickness Without Exposed Support Structures] [N/A:N/A] Exudate Amount: [1:Large] [N/A:N/A] Exudate Type: [1:Serous] [N/A:N/A] Exudate Color: [1:amber] [N/A:N/A] Wound Margin: [1:Thickened] [N/A:N/A] Granulation Amount: [1:Small (1-33%)] [N/A:N/A] Granulation Quality: [1:Pink] [N/A:N/A] Necrotic Amount: [1:Large (67-100%)] [N/A:N/A] Necrotic Tissue: [1:Eschar, Adherent  Slough] [N/A:N/A] Exposed Structures: [1:Fat Layer (Subcutaneous Tissue) Exposed: Yes Fascia: No Tendon: No Muscle: No Joint: No Bone: No] [N/A:N/A] Epithelialization: None N/A N/A Periwound Skin Texture: Induration: Yes N/A N/A Excoriation: No Callus: No Crepitus: No Rash: No Scarring: No Periwound Skin Moisture: Maceration: No N/A N/A Dry/Scaly: No Periwound Skin Color: Atrophie Blanche: No N/A N/A Cyanosis: No Ecchymosis: No Erythema: No Hemosiderin Staining: No Mottled: No Pallor: No Rubor: No Temperature: No Abnormality N/A N/A Tenderness on Palpation: Yes N/A N/A Wound Preparation: Ulcer Cleansing: N/A N/A Rinsed/Irrigated with Saline Topical Anesthetic Applied: Other: lidocaine 4% Treatment Notes Electronic Signature(s) Signed: 11/08/2017 5:45:37 PM By: [name] Entered By: [name] on 11/08/2017 11:01:03 Eduardo [name], [name] ([id number]) -------------------------------------------------------------------------------- Multi-Disciplinary Care Plan Details Patient Name: Eduardo [name]. Date of Service: 11/08/2017 9:45 AM Medical Record Number: [id number] Patient Account Number: [id number] Date of Birth/Sex: 03/17/1931 (84 y.o. M) Treating RN: [name]  Primary Care Tayden Duran: [name] Other Clinician: Referring Bowie Doiron: [name] Treating Shamiyah Ngu/Extender: Melburn Hake, HOYT Weeks in Treatment: 0 Active Inactive ` Abuse / Safety / Falls / Self Care Management Nursing Diagnoses: Potential for falls Goals: Patient will remain injury free related to falls Date Initiated: 11/08/2017 Target Resolution Date: 01/17/2018 Goal Status: Active Interventions: Assess fall risk on admission and as needed Notes: ` Orientation to the Wound Care Program Nursing Diagnoses: Knowledge deficit related to the wound healing center program Goals: Patient/caregiver will verbalize understanding of the [facility] Program Date Initiated:  11/08/2017 Target Resolution Date: 01/17/2018 Goal Status: Active Interventions: Provide education on orientation to the wound center Notes: ` Wound/Skin Impairment Nursing Diagnoses: Impaired tissue integrity Goals: Ulcer/skin breakdown will heal within 14 weeks Date Initiated: 11/08/2017 Target Resolution Date: 01/17/2018 Goal Status: Active Interventions: Eduardo [name], Eduardo [name] ([id number]) Assess patient/caregiver ability to obtain necessary supplies Assess patient/caregiver ability to perform ulcer/skin care regimen upon admission and as needed Assess ulceration(s) every visit Notes: Electronic Signature(s) Signed: 11/08/2017 5:45:37 PM By: [name] Entered By: [name] on 11/08/2017 11:00:40 Eduardo [name], [name] ([id number]) -------------------------------------------------------------------------------- Pain Assessment Details Patient Name: Eduardo [name]. Date of Service: 11/08/2017 9:45 AM Medical Record Number: [id number] Patient Account Number: [id number] Date of Birth/Sex: July 18, 1930 (84 y.o. M) Treating RN: Cornell Barman Primary Care Jamire Shabazz: [name] Other Clinician: Referring Romani Wilbon: [name] Treating Nicolette Gieske/Extender: Melburn Hake, HOYT Weeks in Treatment: 0 Active Problems Location of Pain Severity and Description of Pain Patient Has Paino No Site Locations With Dressing Change: No Pain Management and Medication Current Pain Management: Goals for Pain Management Topical or injectable lidocaine is offered to patient for acute pain when surgical debridement is performed. If needed, Patient is instructed to use over the counter pain medication for the following 24-48 hours after debridement. Wound care MDs do not prescribed pain medications. Patient has chronic pain or uncontrolled pain. Patient has been instructed to make an appointment with their Primary Care Physician for pain management. Electronic Signature(s) Signed: 11/08/2017  5:30:19 PM By: Gretta Cool, BSN, RN, CWS, Kim RN, BSN Entered By: Gretta Cool, BSN, RN, CWS, Kim on 11/08/2017 09:55:42 Eduardo [name], Eduardo [name] ([id number]) -------------------------------------------------------------------------------- Wound Assessment Details Patient Name: Eduardo [name], Eduardo [name]. Date of Service: 11/08/2017 9:45 AM Medical Record Number: [id number] Patient Account Number: [id number] Date of Birth/Sex: 06-Aug-1930 (84 y.o. M) Treating RN: Cornell Barman Primary Care Avleen Bordwell: [name] Other Clinician: Referring Haydee Jabbour: [name] Treating Syniah Berne/Extender: Melburn Hake, HOYT Weeks in Treatment: 0 Wound Status Wound Number: 1 Primary Trauma, Other Etiology: Wound Location: Right Lower Leg Wound Open Wounding Event: Trauma Status: Date Acquired: 10/15/2017 Comorbid Glaucoma, Chronic Obstructive Pulmonary Weeks Of Treatment: 0 History: Disease (COPD), Arrhythmia, Congestive Heart Clustered Wound: No Failure, Coronary Artery Disease, Myocardial Infarction, Neuropathy Photos Photo Uploaded By: Gretta Cool, BSN, RN, CWS, Kim on 11/08/2017 14:02:59 Wound Measurements Length: (cm) 3.2 Width: (cm) 2 Depth: (cm) 0.4 Area: (cm) 5.027 Volume: (cm) 2.011 % Reduction in Area: 0% % Reduction in Volume: 0% Epithelialization: None Tunneling: No Undermining: No Wound Description Full Thickness Without Exposed Support Foul Odo Classification: Structures Slough/F Wound Margin: Thickened Exudate Large Amount: Exudate Type: Serous Exudate Color: amber r After Cleansing: No ibrino Yes Wound Bed Granulation Amount: Small (1-33%) Exposed Structure Granulation Quality: Pink Fascia Exposed: No Necrotic Amount: Large (67-100%) Fat Layer (Subcutaneous Tissue) Exposed: Yes Necrotic Quality: Eschar, Adherent Slough Tendon Exposed: No Muscle Exposed: No Joint Exposed: No Eduardo [name], [name] ([id number]) Bone Exposed: No Periwound Skin Texture Texture Color No  Abnormalities Noted:  No No Abnormalities Noted: No Callus: No Atrophie Blanche: No Crepitus: No Cyanosis: No Excoriation: No Ecchymosis: No Induration: Yes Erythema: No Rash: No Hemosiderin Staining: No Scarring: No Mottled: No Pallor: No Moisture Rubor: No No Abnormalities Noted: No Dry / Scaly: No Temperature / Pain Maceration: No Temperature: No Abnormality Tenderness on Palpation: Yes Wound Preparation Ulcer Cleansing: Rinsed/Irrigated with Saline Topical Anesthetic Applied: Other: lidocaine 4%, Electronic Signature(s) Signed: 11/08/2017 5:30:19 PM By: Gretta Cool, BSN, RN, CWS, Kim RN, BSN Entered By: Gretta Cool, BSN, RN, CWS, Kim on 11/08/2017 10:18:41 Eduardo [name], Eduardo [name] ([id number]) -------------------------------------------------------------------------------- Vitals Details Patient Name: Eduardo [name]. Date of Service: 11/08/2017 9:45 AM Medical Record Number: [id number] Patient Account Number: [id number] Date of Birth/Sex: 1930-09-24 (84 y.o. M) Treating RN: Cornell Barman Primary Care Tashira Torre: [name] Other Clinician: Referring Jordana Dugue: [name] Treating Malikhi Ogan/Extender: Melburn Hake, HOYT Weeks in Treatment: 0 Vital Signs Time Taken: 09:55 Temperature (F): 98.0 Height (in): 62 Pulse (bpm): 72 Source: Measured Respiratory Rate (breaths/min): 16 Weight (lbs): 153.7 Blood Pressure (mmHg): 129/56 Source: Measured Reference Range: 80 - 120 mg / dl Body Mass Index (BMI): 28.1 Electronic Signature(s) Signed: 11/08/2017 5:30:19 PM By: Gretta Cool, BSN, RN, CWS, Kim RN, BSN Entered By: Gretta Cool, BSN, RN, CWS, Kim on 11/08/2017 09:56:10

## 2017-11-10 NOTE — Progress Notes (Signed)
SHIZUO, BISKUP ([id number]) Visit Report for 11/08/2017 Chief Complaint Document Details Patient Name: HEAVEN, WANDELL. Date of Service: 11/08/2017 9:45 AM Medical Record Number: [id number] Patient Account Number: [id number] Date of Birth/Sex: [birth year] (84 y.o. M) Treating RN: [name] Primary Care Provider: [name] Other Clinician: Referring Provider: [name] Treating Provider/Extender: Melburn Hake, Charlena Haub Weeks in Treatment: 0 Information Obtained from: Patient Chief Complaint Right lower leg ulcer due to trauma Electronic Signature(s) Signed: 11/08/2017 11:45:50 PM By: Worthy Keeler PA-C Entered By: Worthy Keeler on 11/08/2017 18:28:08 Masoud, Talon M. ([id number]) -------------------------------------------------------------------------------- Debridement Details Patient Name: [name]. Date of Service: 11/08/2017 9:45 AM Medical Record Number: [id number] Patient Account Number: [id number] Date of Birth/Sex: 09/26/1930 (84 y.o. M) Treating RN: [name] Primary Care Provider: [name] Other Clinician: Referring Provider: [name] Treating Provider/Extender: Melburn Hake, Antwian Santaana Weeks in Treatment: 0 Debridement Performed for Wound #1 Right Lower Leg Assessment: Performed By: Physician STONE III, Sirinity Outland E., PA-C Debridement Type: Debridement Pre-procedure Verification/Time Yes - 11:04 Out Taken: Start Time: 11:04 Pain Control: Lidocaine 4% Topical Solution Total Area Debrided (L x W): 3.2 (cm) x 2 (cm) = 6.4 (cm) Tissue and other material Viable, Non-Viable, Eschar, Slough, Subcutaneous, Slough debrided: Level: Skin/Subcutaneous Tissue Debridement Description: Excisional Instrument: Curette Bleeding: Moderate Hemostasis Achieved: Pressure End Time: 11:10 Procedural Pain: 0 Post Procedural Pain: 0 Response to Treatment: Procedure was tolerated well Level of Consciousness: Awake and Alert Post Procedure  Vitals: Temperature: 98.0 Pulse: 72 Respiratory Rate: 16 Blood Pressure: Systolic Blood Pressure: 458 Diastolic Blood Pressure: 56 Post Debridement Measurements of Total Wound Length: (cm) 3.7 Width: (cm) 2.1 Depth: (cm) 0.5 Volume: (cm) 3.051 Character of Wound/Ulcer Post Debridement: Improved Post Procedure Diagnosis Same as Pre-procedure Electronic Signature(s) Signed: 11/08/2017 5:45:37 PM By: [name] Signed: 11/08/2017 11:45:50 PM By: Worthy Keeler PA-C Entered By: [name] on 11/08/2017 11:12:58 Moist, Laken M. ([id number]) -------------------------------------------------------------------------------- HPI Details Patient Name: [name]. Date of Service: 11/08/2017 9:45 AM Medical Record Number: [id number] Patient Account Number: [id number] Date of Birth/Sex: 11-05-1930 (84 y.o. M) Treating RN: [name] Primary Care Provider: [name] Other Clinician: Referring Provider: [name] Treating Provider/Extender: Melburn Hake, Geetika Laborde Weeks in Treatment: 0 History of Present Illness HPI Description: 11/08/17 on evaluation today patient presents initially concerning a right dramatic lower extremity ulcer/skin tear which occurred three weeks ago. He states that he initially was seen in urgent care where the area was sutured closed. The sutures stayed in for two weeks he tells me. Subsequently he was seen at Dr. Alla German office where the sutures were removed and Steri-Strips were placed over the next five days. Subsequently these were also removed and unfortunately at this time it was noted that the patient had a dehisced larger wound that required further attention. The patient and his wife actually requested referral to the wound care center in [place]. [name] made the referral to [place]. Subsequently the patient does have a history of atrial fibrillation, cardiovascular disease, COPD, stroke, and is on long-term Eliquis. There's no history of  diabetes that he states. Subsequently he has no other major medical problems currently. No fevers, chills, nausea, or vomiting noted at this time. Electronic Signature(s) Signed: 11/08/2017 11:45:50 PM By: Worthy Keeler PA-C Entered By: Worthy Keeler on 11/08/2017 23:17:51 Moss, Aram M. ([id number]) -------------------------------------------------------------------------------- Physical Exam Details Patient Name: THAISON, KOLODZIEJSKI M. Date of Service: 11/08/2017 9:45 AM Medical Record Number: [id number] Patient Account Number: [id number] Date of Birth/Sex: 1931/01/24 (84 y.o.  M) Treating RN: [name] Primary Care Provider: [name] Other Clinician: Referring Provider: [name] Treating Provider/Extender: Melburn Hake, Elleanna Melling Weeks in Treatment: 0 Constitutional sitting or standing blood pressure is within target range for patient.. pulse regular and within target range for patient.Marland Kitchen respirations regular, non-labored and within target range for patient.Marland Kitchen temperature within target range for patient.. Well- nourished and well-hydrated in no acute distress. Eyes conjunctiva clear no eyelid edema noted. pupils equal round and reactive to light and accommodation. Ears, Nose, Mouth, and Throat no gross abnormality of ear auricles or external auditory canals. normal hearing noted during conversation. mucus membranes moist. Cardiovascular regular rate and rhythm with normal S1, S2. 1+ dorsalis pedis/posterior tibialis pulses. 1+ pitting edema of the bilateral lower extremities. Gastrointestinal (GI) soft, non-tender, non-distended, +BS. no ventral hernia noted. Musculoskeletal normal gait and posture. no significant deformity or arthritic changes, no loss or range of motion, no clubbing. Psychiatric this patient is able to make decisions and demonstrates good insight into disease process. Alert and Oriented x 3. pleasant and cooperative. Notes On inspection today  patient's wound actually shows evidence of slough noted in the surface of the wound bed fortunately there does not appear to be any evidence of infection. This did require sharp debridement and after obtaining verbal informed consent debridement was performed with good result. He had a much improved appearance to the wound bed although there did appear to still be some necessity for continued debridement. Electronic Signature(s) Signed: 11/08/2017 11:45:50 PM By: Worthy Keeler PA-C Entered By: Worthy Keeler on 11/08/2017 23:19:26 Haugen, Kanye Jerilynn Mages ([id number]) -------------------------------------------------------------------------------- Physician Orders Details Patient Name: [name]. Date of Service: 11/08/2017 9:45 AM Medical Record Number: [id number] Patient Account Number: [id number] Date of Birth/Sex: February 27, 1931 (84 y.o. M) Treating RN: [name] Primary Care Provider: [name] Other Clinician: Referring Provider: [name] Treating Provider/Extender: Melburn Hake, Kincade Granberg Weeks in Treatment: 0 Verbal / Phone Orders: No Diagnosis Coding Wound Cleansing Wound #1 Right Lower Leg o Clean wound with Normal Saline. Anesthetic (add to Medication List) Wound #1 Right Lower Leg o Topical Lidocaine 4% cream applied to wound bed prior to debridement (In Clinic Only). o Benzocaine Topical Anesthetic Spray applied to wound bed prior to debridement (In Clinic Only). Primary Wound Dressing Wound #1 Right Lower Leg o Iodoflex Secondary Dressing Wound #1 Right Lower Leg o Boardered Foam Dressing o Drawtex Dressing Change Frequency Wound #1 Right Lower Leg o Change dressing every other day. Follow-up Appointments Wound #1 Right Lower Leg o Return Appointment in 1 week. Edema Control Wound #1 Right Lower Leg o Other: - tubigrip both legs Additional Orders / Instructions Wound #1 Right Lower Leg o Vitamin A; Vitamin C, Zinc - Please add  a multivitamin that has 100% daily value of vitamin A, vitamin C and zinc supplements o Increase protein intake. Services and Therapies o Arterial Studies- Bilateral BECKFORD, Traveon M. ([id number]) Electronic Signature(s) Signed: 11/08/2017 5:45:37 PM By: [name] Signed: 11/08/2017 11:45:50 PM By: Worthy Keeler PA-C Entered By: [name] on 11/08/2017 11:18:00 Gau, Tavares M. ([id number]) -------------------------------------------------------------------------------- Problem List Details Patient Name: EMBRY, HUSS. Date of Service: 11/08/2017 9:45 AM Medical Record Number: [id number] Patient Account Number: [id number] Date of Birth/Sex: January 20, 1931 (84 y.o. M) Treating RN: [name] Primary Care Provider: [name] Other Clinician: Referring Provider: [name] Treating Provider/Extender: Melburn Hake, Jahzeel Poythress Weeks in Treatment: 0 Active Problems ICD-10 Impacting Encounter Code Description Active Date Wound Healing Diagnosis S81.801A Unspecified open wound, right  lower leg, initial encounter 11/08/2017 No Yes L97.812 Non-pressure chronic ulcer of other part of right lower leg with 11/08/2017 No Yes fat layer exposed I48.0 Paroxysmal atrial fibrillation 11/08/2017 No Yes Z79.01 Long term (current) use of anticoagulants 11/08/2017 No Yes J44.9 Chronic obstructive pulmonary disease, unspecified 11/08/2017 No Yes I25.10 Atherosclerotic heart disease of native coronary artery 11/08/2017 No Yes without angina pectoris Inactive Problems Resolved Problems Electronic Signature(s) Signed: 11/08/2017 11:45:50 PM By: Worthy Keeler PA-C Entered By: Worthy Keeler on 11/08/2017 18:27:31 Delpriore, Muneer M. ([id number]) -------------------------------------------------------------------------------- Progress Note Details Patient Name: [name]. Date of Service: 11/08/2017 9:45 AM Medical Record Number: [id number] Patient Account Number:  [id number] Date of Birth/Sex: [birth year] (84 y.o. M) Treating RN: [name] Primary Care Provider: [name] Other Clinician: Referring Provider: [name] Treating Provider/Extender: Melburn Hake, Frank Pilger Weeks in Treatment: 0 Subjective Chief Complaint Information obtained from Patient Right lower leg ulcer due to trauma History of Present Illness (HPI) 11/08/17 on evaluation today patient presents initially concerning a right dramatic lower extremity ulcer/skin tear which occurred three weeks ago. He states that he initially was seen in urgent care where the area was sutured closed. The sutures stayed in for two weeks he tells me. Subsequently he was seen at Dr. Alla German office where the sutures were removed and Steri-Strips were placed over the next five days. Subsequently these were also removed and unfortunately at this time it was noted that the patient had a dehisced larger wound that required further attention. The patient and his wife actually requested referral to the wound care center in Dr. [name] made the referral to [place]. Subsequently the patient does have a history of atrial fibrillation, cardiovascular disease, COPD, stroke, and is on long-term Eliquis. There's no history of diabetes that he states. Subsequently he has no other major medical problems currently. No fevers, chills, nausea, or vomiting noted at this time. Wound History Patient presents with 1 open wound that has been present for approximately 3.5 weeks. Patient has been treating wound in the following manner: wet to dry. Laboratory tests have been performed in the last month. Patient reportedly has not tested positive for an antibiotic resistant organism. Patient reportedly has not tested positive for osteomyelitis. Patient reportedly has not had testing performed to evaluate circulation in the legs. Patient History Information obtained from Patient. Allergies penicillin (Reaction: hives), Sulfa  (Sulfonamide Antibiotics) (Reaction: interferes with Kidneys) Family History Cancer - Mother,Child, Heart Disease - Father, Lung Disease - Child, Stroke - Maternal Grandparents, No family history of Diabetes, Hereditary Spherocytosis, Hypertension, Kidney Disease, Seizures, Thyroid Problems, Tuberculosis. Social History Former smoker - 53 years, Marital Status - Married, Alcohol Use - Never, Caffeine Use - Daily. Medical History Eyes Patient has history of Glaucoma - replaced Denies history of Cataracts, Optic Neuritis Ear/Nose/Mouth/Throat Denies history of Chronic sinus problems/congestion, Middle ear problems Hematologic/Lymphatic Denies history of Anemia, Hemophilia, Human Immunodeficiency Virus, Lymphedema, Sickle Cell Disease Respiratory Patient has history of Chronic Obstructive Pulmonary Disease (COPD) Blackston, Dontrel M. ([id number]) Denies history of Aspiration, Asthma, Pneumothorax, Sleep Apnea, Tuberculosis Cardiovascular Patient has history of Arrhythmia - A-fib, Congestive Heart Failure, Coronary Artery Disease, Myocardial Infarction - 2013 Denies history of Angina, Deep Vein Thrombosis, Hypertension, Hypotension, Peripheral Arterial Disease, Peripheral Venous Disease, Phlebitis, Vasculitis Gastrointestinal Denies history of Cirrhosis , Colitis, Crohn s, Hepatitis A, Hepatitis B, Hepatitis C Endocrine Denies history of Type I Diabetes, Type II Diabetes Genitourinary Denies history of End Stage Renal Disease Immunological Denies history of Lupus Erythematosus, Raynaud  s, Scleroderma Integumentary (Skin) Denies history of History of Burn, History of pressure wounds Musculoskeletal Denies history of Gout, Rheumatoid Arthritis, Osteoarthritis, Osteomyelitis Neurologic Patient has history of Neuropathy - Feet and legs Denies history of Dementia, Quadriplegia, Paraplegia, Seizure Disorder Oncologic Denies history of Received Chemotherapy, Received  Radiation Psychiatric Denies history of Anorexia/bulimia, Confinement Anxiety Review of Systems (ROS) Constitutional Symptoms (General Health) The patient has no complaints or symptoms. Eyes Complains or has symptoms of Vision Changes - reading. Ear/Nose/Mouth/Throat Complains or has symptoms of Sinusitis. Denies complaints or symptoms of Difficult clearing ears. Hematologic/Lymphatic The patient has no complaints or symptoms. Respiratory Complains or has symptoms of Shortness of Breath. Cardiovascular Complains or has symptoms of LE edema. Gastrointestinal The patient has no complaints or symptoms. Endocrine The patient has no complaints or symptoms. Genitourinary The patient has no complaints or symptoms. Immunological Complains or has symptoms of Itching - rash. Integumentary (Skin) Complains or has symptoms of Wounds, Bleeding or bruising tendency. Denies complaints or symptoms of Breakdown, Swelling. Musculoskeletal The patient has no complaints or symptoms. Neurologic Complains or has symptoms of Numbness/parasthesias. Oncologic The patient has no complaints or symptoms. Psychiatric The patient has no complaints or symptoms. Devino, Harris M. ([id number]) Objective Constitutional sitting or standing blood pressure is within target range for patient.. pulse regular and within target range for patient.Marland Kitchen respirations regular, non-labored and within target range for patient.Marland Kitchen temperature within target range for patient.. Well- nourished and well-hydrated in no acute distress. Vitals Time Taken: 9:55 AM, Height: 62 in, Source: Measured, Weight: 153.7 lbs, Source: Measured, BMI: 28.1, Temperature: 98.0 F, Pulse: 72 bpm, Respiratory Rate: 16 breaths/min, Blood Pressure: 129/56 mmHg. Eyes conjunctiva clear no eyelid edema noted. pupils equal round and reactive to light and accommodation. Ears, Nose, Mouth, and Throat no gross abnormality of ear auricles or external  auditory canals. normal hearing noted during conversation. mucus membranes moist. Cardiovascular regular rate and rhythm with normal S1, S2. 1+ dorsalis pedis/posterior tibialis pulses. 1+ pitting edema of the bilateral lower extremities. Gastrointestinal (GI) soft, non-tender, non-distended, +BS. no ventral hernia noted. Musculoskeletal normal gait and posture. no significant deformity or arthritic changes, no loss or range of motion, no clubbing. Psychiatric this patient is able to make decisions and demonstrates good insight into disease process. Alert and Oriented x 3. pleasant and cooperative. General Notes: On inspection today patient's wound actually shows evidence of slough noted in the surface of the wound bed fortunately there does not appear to be any evidence of infection. This did require sharp debridement and after obtaining verbal informed consent debridement was performed with good result. He had a much improved appearance to the wound bed although there did appear to still be some necessity for continued debridement. Integumentary (Hair, Skin) Wound #1 status is Open. Original cause of wound was Trauma. The wound is located on the Right Lower Leg. The wound measures 3.2cm length x 2cm width x 0.4cm depth; 5.027cm^2 area and 2.011cm^3 volume. There is Fat Layer (Subcutaneous Tissue) Exposed exposed. There is no tunneling or undermining noted. There is a large amount of serous drainage noted. The wound margin is thickened. There is small (1-33%) pink granulation within the wound bed. There is a large (67-100%) amount of necrotic tissue within the wound bed including Eschar and Adherent Slough. The periwound skin appearance exhibited: Induration. The periwound skin appearance did not exhibit: Callus, Crepitus, Excoriation, Rash, Scarring, Dry/Scaly, Maceration, Atrophie Blanche, Cyanosis, Ecchymosis, Hemosiderin Staining, Mottled, Pallor, Rubor, Erythema. Periwound temperature  was noted as  No Abnormality. The periwound has tenderness on palpation. Lindsley, Ennio M. ([id number]) Assessment Active Problems ICD-10 S81.801A - Unspecified open wound, right lower leg, initial encounter L97.812 - Non-pressure chronic ulcer of other part of right lower leg with fat layer exposed I48.0 - Paroxysmal atrial fibrillation Z79.01 - Long term (current) use of anticoagulants J44.9 - Chronic obstructive pulmonary disease, unspecified I25.10 - Atherosclerotic heart disease of native coronary artery without angina pectoris Procedures Wound #1 Pre-procedure diagnosis of Wound #1 is a Trauma, Other located on the Right Lower Leg . There was a Excisional Skin/Subcutaneous Tissue Debridement with a total area of 6.4 sq cm performed by STONE III, [place] Mccuen E., PA-C. With the following instrument(s): Curette to remove Viable and Non-Viable tissue/material. Material removed includes Eschar, Subcutaneous Tissue, and Slough after achieving pain control using Lidocaine 4% Topical Solution. No specimens were taken. A time out was conducted at 11:04, prior to the start of the procedure. A Moderate amount of bleeding was controlled with Pressure. The procedure was tolerated well with a pain level of 0 throughout and a pain level of 0 following the procedure. Patient s Level of Consciousness post procedure was recorded as Awake and Alert. Post Debridement Measurements: 3.7cm length x 2.1cm width x 0.5cm depth; 3.051cm^3 volume. Character of Wound/Ulcer Post Debridement is improved. Post procedure Diagnosis Wound #1: Same as Pre-Procedure Plan Wound Cleansing: Wound #1 Right Lower Leg: Clean wound with Normal Saline. Anesthetic (add to Medication List): Wound #1 Right Lower Leg: Topical Lidocaine 4% cream applied to wound bed prior to debridement (In Clinic Only). Benzocaine Topical Anesthetic Spray applied to wound bed prior to debridement (In Clinic Only). Primary Wound Dressing: Wound #1  Right Lower Leg: Iodoflex Secondary Dressing: Wound #1 Right Lower Leg: Boardered Foam Dressing Drawtex Dressing Change Frequency: Wound #1 Right Lower Leg: Change dressing every other day. Follow-up Appointments: Wound #1 Right Lower Leg: Return Appointment in 1 week. Etienne, Stonewall M. ([id number]) Edema Control: Wound #1 Right Lower Leg: Other: - tubigrip both legs Additional Orders / Instructions: Wound #1 Right Lower Leg: Vitamin A; Vitamin C, Zinc - Please add a multivitamin that has 100% daily value of vitamin A, vitamin C and zinc supplements Increase protein intake. Services and Therapies ordered were: Arterial Studies- Bilateral Currently I'm gonna recommend that we initiate the above wound care orders for the next week. Patient is in agreement with the plan. I will subsequently see him for reevaluation at that point to see were things stand. If anything changes in the interim he will contact our office. Arterial studies will be performed to evaluate for the possibility of compression. For the time being we're gonna initiate treatment with Tubigrip to help with compression at this time. We will subsequently see were things stand at follow-up. Please see above for specific wound care orders. We will see patient for re-evaluation in 1 week(s) here in the clinic. If anything worsens or changes patient will contact our office for additional recommendations. Electronic Signature(s) Signed: 11/08/2017 11:45:50 PM By: Worthy Keeler PA-C Entered By: Worthy Keeler on 11/08/2017 23:20:37 Creasman, Nikko M. ([id number]) -------------------------------------------------------------------------------- ROS/PFSH Details Patient Name: [name]. Date of Service: 11/08/2017 9:45 AM Medical Record Number: [id number] Patient Account Number: [id number] Date of Birth/Sex: 08-15-1930 (84 y.o. M) Treating RN: Cornell Barman Primary Care Provider: [name] Other  Clinician: Referring Provider: [name] Treating Provider/Extender: Melburn Hake, Sonika Levins Weeks in Treatment: 0 Information Obtained From Patient Wound History Do you currently have one or more open  woundso Yes How many open wounds do you currently haveo 1 Approximately how long have you had your woundso 3.5 weeks How have you been treating your wound(s) until nowo wet to dry Has your wound(s) ever healed and then re-openedo No Have you had any lab work done in the past montho Yes Have you tested positive for an antibiotic resistant organism (MRSA, VRE)o No Have you tested positive for osteomyelitis (bone infection)o No Have you had any tests for circulation on your legso No Eyes Complaints and Symptoms: Positive for: Vision Changes - reading Medical History: Positive for: Glaucoma - replaced Negative for: Cataracts; Optic Neuritis Ear/Nose/Mouth/Throat Complaints and Symptoms: Positive for: Sinusitis Negative for: Difficult clearing ears Medical History: Negative for: Chronic sinus problems/congestion; Middle ear problems Respiratory Complaints and Symptoms: Positive for: Shortness of Breath Medical History: Positive for: Chronic Obstructive Pulmonary Disease (COPD) Negative for: Aspiration; Asthma; Pneumothorax; Sleep Apnea; Tuberculosis Cardiovascular Complaints and Symptoms: Positive for: LE edema Medical History: Positive for: Arrhythmia - A-fib; Congestive Heart Failure; Coronary Artery Disease; Myocardial Infarction - 2013 Negative for: Angina; Deep Vein Thrombosis; Hypertension; Hypotension; Peripheral Arterial Disease; Peripheral Venous Disease; Phlebitis; Vasculitis Gilliand, Hadyn M. ([id number]) Immunological Complaints and Symptoms: Positive for: Itching - rash Medical History: Negative for: Lupus Erythematosus; Raynaudos; Scleroderma Integumentary (Skin) Complaints and Symptoms: Positive for: Wounds; Bleeding or bruising tendency Negative for:  Breakdown; Swelling Medical History: Negative for: History of Burn; History of pressure wounds Neurologic Complaints and Symptoms: Positive for: Numbness/parasthesias Medical History: Positive for: Neuropathy - Feet and legs Negative for: Dementia; Quadriplegia; Paraplegia; Seizure Disorder Constitutional Symptoms (General Health) Complaints and Symptoms: No Complaints or Symptoms Hematologic/Lymphatic Complaints and Symptoms: No Complaints or Symptoms Medical History: Negative for: Anemia; Hemophilia; Human Immunodeficiency Virus; Lymphedema; Sickle Cell Disease Gastrointestinal Complaints and Symptoms: No Complaints or Symptoms Medical History: Negative for: Cirrhosis ; Colitis; Crohnos; Hepatitis A; Hepatitis B; Hepatitis C Endocrine Complaints and Symptoms: No Complaints or Symptoms Medical History: Negative for: Type I Diabetes; Type II Diabetes Genitourinary Complaints and Symptoms: No Complaints or Symptoms Hollenbaugh, Ivon M. ([id number]) Medical History: Negative for: End Stage Renal Disease Musculoskeletal Complaints and Symptoms: No Complaints or Symptoms Medical History: Negative for: Gout; Rheumatoid Arthritis; Osteoarthritis; Osteomyelitis Oncologic Complaints and Symptoms: No Complaints or Symptoms Medical History: Negative for: Received Chemotherapy; Received Radiation Psychiatric Complaints and Symptoms: No Complaints or Symptoms Medical History: Negative for: Anorexia/bulimia; Confinement Anxiety HBO Extended History Items Eyes: Glaucoma Immunizations Pneumococcal Vaccine: Received Pneumococcal Vaccination: Yes Implantable Devices Family and Social History Cancer: Yes - Mother,Child; Diabetes: No; Heart Disease: Yes - Father; Hereditary Spherocytosis: No; Hypertension: No; Kidney Disease: No; Lung Disease: Yes - Child; Seizures: No; Stroke: Yes - Maternal Grandparents; Thyroid Problems: No; Tuberculosis: No; Former smoker - 66 years; Marital  Status - Married; Alcohol Use: Never; Caffeine Use: Daily; Advanced Directives: Yes; Living Will: Yes (Copy provided); Medical Power of Attorney: Astronomer) Signed: 11/08/2017 5:30:19 PM By: Gretta Cool, BSN, RN, CWS, Kim RN, BSN Signed: 11/08/2017 11:45:50 PM By: Worthy Keeler PA-C Entered By: Gretta Cool BSN, RN, CWS, Kim on 11/08/2017 10:07:45 Larranaga, Calla Kicks ([id number]) -------------------------------------------------------------------------------- [place] Details Patient Name: [name]. Date of Service: 11/08/2017 Medical Record Number: [id number] Patient Account Number: [id number] Date of Birth/Sex: 02/17/1931 (84 y.o. M) Treating RN: [name] Primary Care Provider: [name] Other Clinician: Referring Provider: [name] Treating Provider/Extender: Melburn Hake, Alizza Sacra Weeks in Treatment: 0 Diagnosis Coding ICD-10 Codes Code Description [phone] Unspecified open wound, right lower leg, initial encounter L97.812 Non-pressure chronic ulcer  of other part of right lower leg with fat layer exposed I48.0 Paroxysmal atrial fibrillation Z79.01 Long term (current) use of anticoagulants J44.9 Chronic obstructive pulmonary disease, unspecified I25.10 Atherosclerotic heart disease of native coronary artery without angina pectoris Facility Procedures CPT4 Code Description: [id number] 99213 - WOUND CARE VISIT-LEV 3 EST PT Modifier: Quantity: 1 CPT4 Code Description: [id number] 11042 - DEB SUBQ TISSUE 20 SQ CM/< ICD-10 Diagnosis Description L97.812 Non-pressure chronic ulcer of other part of right lower leg wit Modifier: h fat layer expo Quantity: 1 sed Physician Procedures CPT4 Code Description: [id number] 40102 - WC PHYS LEVEL 4 - NEW PT ICD-10 Diagnosis Description S81.801A Unspecified open wound, right lower leg, initial encounter L97.812 Non-pressure chronic ulcer of other part of right lower leg wi I48.0 Paroxysmal  atrial fibrillation Z79.01 Long term  (current) use of anticoagulants Modifier: 25 th fat layer expo Quantity: 1 sed CPT4 Code Description: [id number] 40347 - WC PHYS SUBQ TISS 20 SQ CM ICD-10 Diagnosis Description Q25.956 Non-pressure chronic ulcer of other part of right lower leg wi Modifier: th fat layer expo Quantity: 1 sed Electronic Signature(s) Signed: 11/08/2017 11:45:50 PM By: Worthy Keeler PA-C Entered By: Worthy Keeler on 11/08/2017 23:21:21

## 2017-11-11 ENCOUNTER — Telehealth: Payer: Self-pay | Admitting: Internal Medicine

## 2017-11-11 ENCOUNTER — Encounter: Payer: Self-pay | Admitting: Internal Medicine

## 2017-11-11 DIAGNOSIS — S81801A Unspecified open wound, right lower leg, initial encounter: Secondary | ICD-10-CM | POA: Diagnosis not present

## 2017-11-11 NOTE — Telephone Encounter (Signed)
Copied from [place] [id number]. Topic: Complaint - Care >> Nov 08, 2017  4:13 PM Margot Ables wrote: Date of Incident: 11/05/2017 Details of complaint: :Pts wife called stating she is very concerned and feels the patients care was mishandled. She requested a referral thru mychart to the wound center based on her own findings. She did not know this service was available. She states that the situation was not handled properly and the recommendations for treatment were no where near adequate. She took pt to the wound center at [facility] today because he has a thumb size gaping hole in his leg. She states the wound smelled funny Tuesday when pt was in office to see Webb Silversmith, NP. She was sent home with saline and sterile guaze and told to pack the wound. She said the wound center told her the wound would not have healed with this treatment. How would the patient like to see it resolved? She isn't sure what [facility] F[facility] can do but pt requested call from [place] and then requested call from the office manager  Route to Engineer, building services.  >> Nov 11, 2017 10:50 AM Ali Lowe E wrote: Called and left message on VM to follow up on this concern.  Ms. Thomasene Lot came into the office this morning and met with me to discuss her dissatisfaction with how her husband's wound had been treated.  She stated that she should have been told about the wound center long ago and that she should not have had to hear about it through a neighbor.  She also stated that she believed that her husband should have been on antibiotics.  She stated that she has been coming to our office for a long time and always had a good experience and confidence but after this incident she does not feel confident and will not see Rollene Fare moving forward.  She said that the man at the wound center told her that the wound have have never healed the way it was being treated.  She feels that her husband should have been referred there at the beginning and  could not understand why he wasn't.  She was also upset that the prescription that was called in was sent to the wrong pharmacy but that had been corrected and it was no longer a concern. I advised her I was not clinical and could not comment on clinical treatment but that I would share her concerns with Dr. [name] and Rollene Fare.  She said that she is happy to discuss with them directly.

## 2017-11-11 NOTE — Telephone Encounter (Signed)
I called her and left her a detailed message on her identified machine. Told her I thought the wound could heal without specialized care but didn't mind making the wound referral for her. Told if she needs to be angry about this, it should be at me---since I made the decision how to deal with it

## 2017-11-15 ENCOUNTER — Encounter: Payer: PPO | Attending: Nurse Practitioner | Admitting: Nurse Practitioner

## 2017-11-15 DIAGNOSIS — I509 Heart failure, unspecified: Secondary | ICD-10-CM | POA: Diagnosis not present

## 2017-11-15 DIAGNOSIS — H409 Unspecified glaucoma: Secondary | ICD-10-CM | POA: Insufficient documentation

## 2017-11-15 DIAGNOSIS — Z87891 Personal history of nicotine dependence: Secondary | ICD-10-CM | POA: Diagnosis not present

## 2017-11-15 DIAGNOSIS — I252 Old myocardial infarction: Secondary | ICD-10-CM | POA: Diagnosis not present

## 2017-11-15 DIAGNOSIS — Z7901 Long term (current) use of anticoagulants: Secondary | ICD-10-CM | POA: Diagnosis not present

## 2017-11-15 DIAGNOSIS — G629 Polyneuropathy, unspecified: Secondary | ICD-10-CM | POA: Diagnosis not present

## 2017-11-15 DIAGNOSIS — J449 Chronic obstructive pulmonary disease, unspecified: Secondary | ICD-10-CM | POA: Diagnosis not present

## 2017-11-15 DIAGNOSIS — L97812 Non-pressure chronic ulcer of other part of right lower leg with fat layer exposed: Secondary | ICD-10-CM | POA: Insufficient documentation

## 2017-11-15 DIAGNOSIS — S81801A Unspecified open wound, right lower leg, initial encounter: Secondary | ICD-10-CM | POA: Diagnosis not present

## 2017-11-15 DIAGNOSIS — I2510 Atherosclerotic heart disease of native coronary artery without angina pectoris: Secondary | ICD-10-CM | POA: Diagnosis not present

## 2017-11-15 DIAGNOSIS — I480 Paroxysmal atrial fibrillation: Secondary | ICD-10-CM | POA: Diagnosis not present

## 2017-11-15 DIAGNOSIS — Z8673 Personal history of transient ischemic attack (TIA), and cerebral infarction without residual deficits: Secondary | ICD-10-CM | POA: Insufficient documentation

## 2017-11-18 NOTE — Progress Notes (Signed)
IZAIAH, TABB ([id number]) Visit Report for 11/15/2017 Chief Complaint Document Details Patient Name: Eduardo [name], Eduardo [name]. Date of Service: 11/15/2017 1:00 PM Medical Record Number: [id number] Patient Account Number: [id number] Date of Birth/Sex: 1930/07/11 (84 y.o. Male) Treating RN: [name] Primary Care Provider: [name] Other Clinician: Referring Provider: [name] Treating Provider/Extender: Cathie Olden in Treatment: 1 Information Obtained from: Patient Chief Complaint Right lower leg ulcer due to trauma Electronic Signature(s) Signed: 11/15/2017 4:06:25 PM By: Lawanda Cousins Entered By: Lawanda Cousins on 11/15/2017 16:06:25 Wardle, Ventura M. ([id number]) -------------------------------------------------------------------------------- Debridement Details Patient Name: Eduardo [name]. Date of Service: 11/15/2017 1:00 PM Medical Record Number: [id number] Patient Account Number: [id number] Date of Birth/Sex: [birth year] (84 y.o. Male) Treating RN: [name] Primary Care Provider: [name] Other Clinician: Referring Provider: [name] Treating Provider/Extender: Cathie Olden in Treatment: 1 Debridement Performed for Wound #1 Right Lower Leg Assessment: Performed By: Physician Lawanda Cousins, NP Debridement Type: Debridement Pre-procedure Verification/Time Yes - 13:30 Out Taken: Start Time: 13:30 Pain Control: Lidocaine 4% Topical Solution Total Area Debrided (L x W): 3.6 (cm) x 3 (cm) = 10.8 (cm) Tissue and other material Viable, Non-Viable, Slough, Subcutaneous, Slough debrided: Level: Skin/Subcutaneous Tissue Debridement Description: Excisional Instrument: Curette Bleeding: Minimum Hemostasis Achieved: Pressure End Time: 13:32 Procedural Pain: 0 Post Procedural Pain: 0 Response to Treatment: Procedure was tolerated well Level of Consciousness: Awake and Alert Post Procedure Vitals: Temperature:  98.0 Pulse: 74 Respiratory Rate: 16 Blood Pressure: Systolic Blood Pressure: 235 Diastolic Blood Pressure: 64 Post Debridement Measurements of Total Wound Length: (cm) 3.6 Width: (cm) 3 Depth: (cm) 0.6 Volume: (cm) 5.089 Character of Wound/Ulcer Post Debridement: Improved Post Procedure Diagnosis Same as Pre-procedure Electronic Signature(s) Signed: 11/15/2017 4:22:23 PM By: [name] Signed: 11/15/2017 4:45:12 PM By: Lawanda Cousins Entered By: [name] on 11/15/2017 13:37:25 Rawlins, Dorris M. ([id number]) -------------------------------------------------------------------------------- HPI Details Patient Name: Eduardo [name]. Date of Service: 11/15/2017 1:00 PM Medical Record Number: [id number] Patient Account Number: [id number] Date of Birth/Sex: December 12, 1930 (84 y.o. Male) Treating RN: [name] Primary Care Provider: [name] Other Clinician: Referring Provider: [name] Treating Provider/Extender: Cathie Olden in Treatment: 1 History of Present Illness HPI Description: 11/08/17 on evaluation today patient presents initially concerning a right dramatic lower extremity ulcer/skin tear which occurred three weeks ago. He states that he initially was seen in urgent care where the area was sutured closed. The sutures stayed in for two weeks he tells me. Subsequently he was seen at Dr. Alla German office where the sutures were removed and Steri-Strips were placed over the next five days. Subsequently these were also removed and unfortunately at this time it was noted that the patient had a dehisced larger wound that required further attention. The patient and his wife actually requested referral to the wound care center in [place]. [name] made the referral to [place]. Subsequently the patient does have a history of atrial fibrillation, cardiovascular disease, COPD, stroke, and is on long-term Eliquis. There's no history of diabetes that he states.  Subsequently he has no other major medical problems currently. No fevers, chills, nausea, or vomiting noted at this time. 11/14/17-He is seen in follow-up evaluation for right posterior lower extremity ulcer. There is improvement with granulation tissue present; debridement. We will continue with same treatment plan he'll follow-up next week. The vascular office did call to establish an appointment, but he forgot that we had referred him and did not make an appointment. He was encouraged to contact the office to schedule  an appointment for evaluation. Electronic Signature(s) Signed: 11/15/2017 4:10:11 PM By: Lawanda Cousins Previous Signature: 11/15/2017 4:08:35 PM Version By: Lawanda Cousins Entered By: Lawanda Cousins on 11/15/2017 16:10:11 Vavrek, Garland Jerilynn Mages ([id number]) -------------------------------------------------------------------------------- Physician Orders Details Patient Name: Eduardo [name]. Date of Service: 11/15/2017 1:00 PM Medical Record Number: [id number] Patient Account Number: [id number] Date of Birth/Sex: 07/01/1930 (84 y.o. Male) Treating RN: [name] Primary Care Provider: [name] Other Clinician: Referring Provider: [name] Treating Provider/Extender: Cathie Olden in Treatment: 1 Verbal / Phone Orders: No Diagnosis Coding Wound Cleansing Wound #1 Right Lower Leg o Clean wound with Normal Saline. Anesthetic (add to Medication List) Wound #1 Right Lower Leg o Topical Lidocaine 4% cream applied to wound bed prior to debridement (In Clinic Only). o Benzocaine Topical Anesthetic Spray applied to wound bed prior to debridement (In Clinic Only). Primary Wound Dressing Wound #1 Right Lower Leg o Iodoflex Secondary Dressing Wound #1 Right Lower Leg o Boardered Foam Dressing Dressing Change Frequency Wound #1 Right Lower Leg o Change Dressing Monday, Wednesday, Friday Follow-up Appointments Wound #1 Right Lower Leg o Return  Appointment in 1 week. Edema Control Wound #1 Right Lower Leg o Other: - tubigrip both legs Additional Orders / Instructions Wound #1 Right Lower Leg o Vitamin A; Vitamin C, Zinc - Please add a multivitamin that has 100% daily value of vitamin A, vitamin C and zinc supplements o Increase protein intake. Electronic Signature(s) Signed: 11/15/2017 4:22:23 PM By: [name] Signed: 11/15/2017 4:45:12 PM By: Lawanda Cousins Entered By: [name] on 11/15/2017 13:38:45 Frommer, Cleto M. ([id number]) [address], Bhargav M. ([id number]) -------------------------------------------------------------------------------- Problem List Details Patient Name: YOUSSEF, FOOTMAN. Date of Service: 11/15/2017 1:00 PM Medical Record Number: [id number] Patient Account Number: [id number] Date of Birth/Sex: [birth year] (84 y.o. Male) Treating RN: [name] Primary Care Provider: [name] Other Clinician: Referring Provider: [name] Treating Provider/Extender: Cathie Olden in Treatment: 1 Active Problems ICD-10 Impacting Encounter Code Description Active Date Wound Healing Diagnosis L97.812 Non-pressure chronic ulcer of other part of right lower leg with 11/08/2017 No Yes fat layer exposed I48.0 Paroxysmal atrial fibrillation 11/08/2017 No Yes Z79.01 Long term (current) use of anticoagulants 11/08/2017 No Yes J44.9 Chronic obstructive pulmonary disease, unspecified 11/08/2017 No Yes I25.10 Atherosclerotic heart disease of native coronary artery 11/08/2017 No Yes without angina pectoris Inactive Problems Resolved Problems Electronic Signature(s) Signed: 11/15/2017 4:01:40 PM By: Lawanda Cousins Entered By: Lawanda Cousins on 11/15/2017 16:01:39 Croucher, Maicol M. ([id number]) -------------------------------------------------------------------------------- Progress Note Details Patient Name: Eduardo [name]. Date of Service: 11/15/2017 1:00 PM Medical Record Number:  [id number] Patient Account Number: [id number] Date of Birth/Sex: 04-Oct-1930 (84 y.o. Male) Treating RN: [name] Primary Care Provider: [name] Other Clinician: Referring Provider: [name] Treating Provider/Extender: Cathie Olden in Treatment: 1 Subjective Chief Complaint Information obtained from Patient Right lower leg ulcer due to trauma History of Present Illness (HPI) 11/08/17 on evaluation today patient presents initially concerning a right dramatic lower extremity ulcer/skin tear which occurred three weeks ago. He states that he initially was seen in urgent care where the area was sutured closed. The sutures stayed in for two weeks he tells me. Subsequently he was seen at Dr. Alla German office where the sutures were removed and Steri-Strips were placed over the next five days. Subsequently these were also removed and unfortunately at this time it was noted that the patient had a dehisced larger wound that required further attention. The patient and his wife actually requested referral to the wound  care center in [place]. [name] made the referral to [place]. Subsequently the patient does have a history of atrial fibrillation, cardiovascular disease, COPD, stroke, and is on long-term Eliquis. There's no history of diabetes that he states. Subsequently he has no other major medical problems currently. No fevers, chills, nausea, or vomiting noted at this time. 11/14/17-He is seen in follow-up evaluation for right posterior lower extremity ulcer. There is improvement with granulation tissue present; debridement. We will continue with same treatment plan he'll follow-up next week. The vascular office did call to establish an appointment, but he forgot that we had referred him and did not make an appointment. He was encouraged to contact the office to schedule an appointment for evaluation. Patient History Information obtained from Patient. Family History Cancer -  Mother,Child, Heart Disease - Father, Lung Disease - Child, Stroke - Maternal Grandparents, No family history of Diabetes, Hereditary Spherocytosis, Hypertension, Kidney Disease, Seizures, Thyroid Problems, Tuberculosis. Social History Former smoker - 41 years, Marital Status - Married, Alcohol Use - Never, Caffeine Use - Daily. Objective Constitutional Vitals Time Taken: 1:16 PM, Height: 62 in, Weight: 153.7 lbs, BMI: 28.1, Temperature: 98.5 F, Pulse: 74 bpm, Respiratory Rate: 16 breaths/min, Blood Pressure: 129/64 mmHg. Mothershead, Reyan M. ([id number]) Integumentary (Hair, Skin) Wound #1 status is Open. Original cause of wound was Trauma. The wound is located on the Right Lower Leg. The wound measures 3.6cm length x 3cm width x 0.5cm depth; 8.482cm^2 area and 4.241cm^3 volume. There is Fat Layer (Subcutaneous Tissue) Exposed exposed. There is no tunneling or undermining noted. There is a large amount of serous drainage noted. The wound margin is thickened. There is small (1-33%) pink granulation within the wound bed. There is a large (67-100%) amount of necrotic tissue within the wound bed including Eschar and Adherent Slough. The periwound skin appearance exhibited: Induration. The periwound skin appearance did not exhibit: Callus, Crepitus, Excoriation, Rash, Scarring, Dry/Scaly, Maceration, Atrophie Blanche, Cyanosis, Ecchymosis, Hemosiderin Staining, Mottled, Pallor, Rubor, Erythema. Periwound temperature was noted as No Abnormality. The periwound has tenderness on palpation. Assessment Active Problems ICD-10 Non-pressure chronic ulcer of other part of right lower leg with fat layer exposed Paroxysmal atrial fibrillation Long term (current) use of anticoagulants Chronic obstructive pulmonary disease, unspecified Atherosclerotic heart disease of native coronary artery without angina pectoris Procedures Wound #1 Pre-procedure diagnosis of Wound #1 is a Trauma, Other located on the  Right Lower Leg . There was a Excisional Skin/Subcutaneous Tissue Debridement with a total area of 10.8 sq cm performed by Lawanda Cousins, NP. With the following instrument(s): Curette to remove Viable and Non-Viable tissue/material. Material removed includes Subcutaneous Tissue and Slough and after achieving pain control using Lidocaine 4% Topical Solution. No specimens were taken. A time out was conducted at 13:30, prior to the start of the procedure. A Minimum amount of bleeding was controlled with Pressure. The procedure was tolerated well with a pain level of 0 throughout and a pain level of 0 following the procedure. Patient s Level of Consciousness post procedure was recorded as Awake and Alert. Post Debridement Measurements: 3.6cm length x 3cm width x 0.6cm depth; 5.089cm^3 volume. Character of Wound/Ulcer Post Debridement is improved. Post procedure Diagnosis Wound #1: Same as Pre-Procedure Plan Wound Cleansing: Wound #1 Right Lower Leg: Clean wound with Normal Saline. Anesthetic (add to Medication List): Wound #1 Right Lower Leg: Topical Lidocaine 4% cream applied to wound bed prior to debridement (In Clinic Only). Benzocaine Topical Anesthetic Spray applied to wound bed prior to debridement (In  Clinic Only). Troia, Imri M. ([id number]) Primary Wound Dressing: Wound #1 Right Lower Leg: Iodoflex Secondary Dressing: Wound #1 Right Lower Leg: Boardered Foam Dressing Dressing Change Frequency: Wound #1 Right Lower Leg: Change Dressing Monday, Wednesday, Friday Follow-up Appointments: Wound #1 Right Lower Leg: Return Appointment in 1 week. Edema Control: Wound #1 Right Lower Leg: Other: - tubigrip both legs Additional Orders / Instructions: Wound #1 Right Lower Leg: Vitamin A; Vitamin C, Zinc - Please add a multivitamin that has 100% daily value of vitamin A, vitamin C and zinc supplements Increase protein intake. Electronic Signature(s) Signed: 11/15/2017 4:10:23 PM  By: Lawanda Cousins Previous Signature: 11/15/2017 4:09:07 PM Version By: Lawanda Cousins Entered By: Lawanda Cousins on 11/15/2017 16:10:23 Dunk, Shayaan Jerilynn Mages ([id number]) -------------------------------------------------------------------------------- ROS/PFSH Details Patient Name: Eduardo [name]. Date of Service: 11/15/2017 1:00 PM Medical Record Number: [id number] Patient Account Number: [id number] Date of Birth/Sex: 09-14-1930 (84 y.o. Male) Treating RN: [name] Primary Care Provider: [name] Other Clinician: Referring Provider: [name] Treating Provider/Extender: Cathie Olden in Treatment: 1 Information Obtained From Patient Wound History Do you currently have one or more open woundso Yes How many open wounds do you currently haveo 1 Approximately how long have you had your woundso 3.5 weeks How have you been treating your wound(s) until nowo wet to dry Has your wound(s) ever healed and then re-openedo No Have you had any lab work done in the past montho Yes Have you tested positive for an antibiotic resistant organism (MRSA, VRE)o No Have you tested positive for osteomyelitis (bone infection)o No Have you had any tests for circulation on your legso No Eyes Medical History: Positive for: Glaucoma - replaced Negative for: Cataracts; Optic Neuritis Ear/Nose/Mouth/Throat Medical History: Negative for: Chronic sinus problems/congestion; Middle ear problems Hematologic/Lymphatic Medical History: Negative for: Anemia; Hemophilia; Human Immunodeficiency Virus; Lymphedema; Sickle Cell Disease Respiratory Medical History: Positive for: Chronic Obstructive Pulmonary Disease (COPD) Negative for: Aspiration; Asthma; Pneumothorax; Sleep Apnea; Tuberculosis Cardiovascular Medical History: Positive for: Arrhythmia - A-fib; Congestive Heart Failure; Coronary Artery Disease; Myocardial Infarction - 2013 Negative for: Angina; Deep Vein Thrombosis;  Hypertension; Hypotension; Peripheral Arterial Disease; Peripheral Venous Disease; Phlebitis; Vasculitis Gastrointestinal Medical History: Negative for: Cirrhosis ; Colitis; Crohnos; Hepatitis A; Hepatitis B; Hepatitis C Endocrine Billy, Smayan M. ([id number]) Medical History: Negative for: Type I Diabetes; Type II Diabetes Genitourinary Medical History: Negative for: End Stage Renal Disease Immunological Medical History: Negative for: Lupus Erythematosus; Raynaudos; Scleroderma Integumentary (Skin) Medical History: Negative for: History of Burn; History of pressure wounds Musculoskeletal Medical History: Negative for: Gout; Rheumatoid Arthritis; Osteoarthritis; Osteomyelitis Neurologic Medical History: Positive for: Neuropathy - Feet and legs Negative for: Dementia; Quadriplegia; Paraplegia; Seizure Disorder Oncologic Medical History: Negative for: Received Chemotherapy; Received Radiation Psychiatric Medical History: Negative for: Anorexia/bulimia; Confinement Anxiety HBO Extended History Items Eyes: Glaucoma Immunizations Pneumococcal Vaccine: Received Pneumococcal Vaccination: Yes Implantable Devices Family and Social History Cancer: Yes - Mother,Child; Diabetes: No; Heart Disease: Yes - Father; Hereditary Spherocytosis: No; Hypertension: No; Kidney Disease: No; Lung Disease: Yes - Child; Seizures: No; Stroke: Yes - Maternal Grandparents; Thyroid Problems: No; Tuberculosis: No; Former smoker - 9 years; Marital Status - Married; Alcohol Use: Never; Caffeine Use: Daily; Advanced Directives: Yes (Not Provided); Patient does not want information on Advanced Directives; Living Will: Yes (Copy provided); Medical Power of Attorney: Yes (Not Provided) Physician Affirmation I have reviewed and agree with the above information. FERDIG, Izak M. ([id number]) Electronic Signature(s) Signed: 11/15/2017 4:22:23 PM By: [name] Signed: 11/15/2017 4:45:12 PM By: Rene Kocher,  Casee Knepp Entered By: Lawanda Cousins on 11/15/2017 16:08:55 Cumpston, Massie M. ([id number]) -------------------------------------------------------------------------------- [place] Details Patient Name: Eduardo [name]. Date of Service: 11/15/2017 Medical Record Number: [id number] Patient Account Number: [id number] Date of Birth/Sex: [birth year] (84 y.o. Male) Treating RN: [name] Primary Care Provider: [name] Other Clinician: Referring Provider: [name] Treating Provider/Extender: Cathie Olden in Treatment: 1 Diagnosis Coding ICD-10 Codes Code Description [phone] Non-pressure chronic ulcer of other part of right lower leg with fat layer exposed I48.0 Paroxysmal atrial fibrillation Z79.01 Long term (current) use of anticoagulants J44.9 Chronic obstructive pulmonary disease, unspecified I25.10 Atherosclerotic heart disease of native coronary artery without angina pectoris Facility Procedures CPT4 Code Description: [id number] 11042 - DEB SUBQ TISSUE 20 SQ CM/< ICD-10 Diagnosis Description O03.559 Non-pressure chronic ulcer of other part of right lower leg wi Modifier: th fat layer expo Quantity: 1 sed Physician Procedures CPT4 Code Description: [id number] 11042 - WC PHYS SUBQ TISS 20 SQ CM ICD-10 Diagnosis Description T36.468 Non-pressure chronic ulcer of other part of right lower leg wi Modifier: th fat layer expo Quantity: 1 sed Electronic Signature(s) Signed: 11/15/2017 4:09:19 PM By: Lawanda Cousins Entered By: Lawanda Cousins on 11/15/2017 [place]

## 2017-11-18 NOTE — Progress Notes (Signed)
Eduardo, [place] ([id number]) Visit Report for 11/15/2017 Arrival Information Details Patient Name: Eduardo [name], Eduardo [name]. Date of Service: 11/15/2017 1:00 PM Medical Record Number: [id number] Patient Account Number: [id number] Date of Birth/Sex: [birth year] (84 y.o. Male) Treating RN: Roger Shelter Primary Care Lamisha Roussell: [name] Other Clinician: Referring Aaren Atallah: [name] Treating Joseeduardo Brix/Extender: Cathie Olden in Treatment: 1 Visit Information History Since Last Visit All ordered tests and consults were completed: No Patient Arrived: Ambulatory Added or deleted any medications: No Arrival Time: 13:14 Any new allergies or adverse reactions: No Accompanied By: wife Had a fall or experienced change in No Transfer Assistance: None activities of daily living that may affect Patient Identification Verified: Yes risk of falls: Secondary Verification Process Yes Signs or symptoms of abuse/neglect since last visito No Completed: Hospitalized since last visit: No Patient Has Alerts: Yes Implantable device outside of the clinic excluding No Patient Alerts: Patient on Blood cellular tissue based products placed in the center Thinner since last visit: Eliquis Pain Present Now: No 11/08/17 ABI >220 Biateral Electronic Signature(s) Signed: 11/15/2017 4:27:05 PM By: Roger Shelter Entered By: Roger Shelter on 11/15/2017 13:16:32 [facility] [place] [facility], [place]. ([id number]) -------------------------------------------------------------------------------- Encounter Discharge Information Details Patient Name: Eduardo [name]. Date of Service: 11/15/2017 1:00 PM Medical Record Number: [id number] Patient Account Number: [id number] Date of Birth/Sex: [birth year] (84 y.o. Male) Treating RN: Ahmed Prima Primary Care Mairany Bruno: [name] Other Clinician: Referring Myka Hitz: [name] Treating Chanceler Pullin/Extender: Cathie Olden in Treatment: 1 Encounter  Discharge Information Items Discharge Condition: Stable Ambulatory Status: Cane Discharge Destination: Home Transportation: Private Auto Accompanied By: wife Schedule Follow-up Appointment: Yes Clinical Summary of Care: Electronic Signature(s) Signed: 11/15/2017 4:48:34 PM By: Alric Quan Entered By: Alric Quan on 11/15/2017 14:06:07 Howells, Yannis M. ([id number]) -------------------------------------------------------------------------------- Lower Extremity Assessment Details Patient Name: Eduardo [name]. Date of Service: 11/15/2017 1:00 PM Medical Record Number: [id number] Patient Account Number: [id number] Date of Birth/Sex: July 30, 1930 (84 y.o. Male) Treating RN: Roger Shelter Primary Care Nereyda Bowler: [name] Other Clinician: Referring Aishani Kalis: [name] Treating Gilford Lardizabal/Extender: Cathie Olden in Treatment: 1 Edema Assessment Assessed: [Left: No] [Right: No] Edema: [Left: Ye] [Right: s] Calf Left: Right: Point of Measurement: 30 cm From Medial Instep cm 32 cm Ankle Left: Right: Point of Measurement: 10 cm From Medial Instep cm 22 cm Vascular Assessment Claudication: Claudication Assessment [Right:None] Pulses: Dorsalis Pedis Palpable: [Right:Yes] Posterior Tibial Extremity colors, hair growth, and conditions: Extremity Color: [Right:Normal] Hair Growth on Extremity: [Right:Yes] Temperature of Extremity: [Right:Warm] Capillary Refill: [Right:< 3 seconds] Toe Nail Assessment Left: Right: Thick: No Discolored: No Deformed: No Improper Length and Hygiene: No Electronic Signature(s) Signed: 11/15/2017 4:27:05 PM By: Roger Shelter Entered By: Roger Shelter on 11/15/2017 13:20:53 Hodes, Araceli M. ([id number]) -------------------------------------------------------------------------------- Multi Wound Chart Details Patient Name: Eduardo [name]. Date of Service: 11/15/2017 1:00 PM Medical Record Number:  [id number] Patient Account Number: [id number] Date of Birth/Sex: 06-12-1930 (84 y.o. Male) Treating RN: [name] Primary Care Ronnie Doo: [name] Other Clinician: Referring Marisol Glazer: [name] Treating Lj Miyamoto/Extender: Cathie Olden in Treatment: 1 Vital Signs Height(in): 62 Pulse(bpm): 74 Weight(lbs): 153.7 Blood Pressure(mmHg): 129/64 Body Mass Index(BMI): 28 Temperature(F): 98.5 Respiratory Rate 16 (breaths/min): Photos: [N/A:N/A] Wound Location: Right Lower Leg N/A N/A Wounding Event: Trauma N/A N/A Primary Etiology: Trauma, Other N/A N/A Comorbid History: Glaucoma, Chronic N/A N/A Obstructive Pulmonary Disease (COPD), Arrhythmia, Congestive Heart Failure, Coronary Artery Disease, Myocardial Infarction, Neuropathy Date Acquired: 10/15/2017 N/A N/A Weeks of Treatment: 1 N/A N/A Wound Status: Open N/A N/A Measurements L  x W x D 3.6x3x0.5 N/A N/A (cm) Area (cm) : 8.482 N/A N/A Volume (cm) : 4.241 N/A N/A % Reduction in Area: -68.70% N/A N/A % Reduction in Volume: -110.90% N/A N/A Classification: Full Thickness Without N/A N/A Exposed Support Structures Exudate Amount: Large N/A N/A Exudate Type: Serous N/A N/A Exudate Color: amber N/A N/A Wound Margin: Thickened N/A N/A Granulation Amount: Small (1-33%) N/A N/A Granulation Quality: Pink N/A N/A Necrotic Amount: Large (67-100%) N/A N/A Necrotic Tissue: Eschar, Adherent Slough N/A N/A Sinha, Wyland M. ([id number]) Exposed Structures: Fat Layer (Subcutaneous N/A N/A Tissue) Exposed: Yes Fascia: No Tendon: No Muscle: No Joint: No Bone: No Epithelialization: None N/A N/A Debridement: Debridement - Excisional N/A N/A Pre-procedure 13:30 N/A N/A Verification/Time Out Taken: Pain Control: Lidocaine 4% Topical Solution N/A N/A Tissue Debrided: Subcutaneous, Slough N/A N/A Level: Skin/Subcutaneous Tissue N/A N/A Debridement Area (sq cm): 10.8 N/A N/A Instrument: Curette N/A  N/A Bleeding: Minimum N/A N/A Hemostasis Achieved: Pressure N/A N/A Procedural Pain: 0 N/A N/A Post Procedural Pain: 0 N/A N/A Debridement Treatment Procedure was tolerated well N/A N/A Response: Post Debridement 3.6x3x0.6 N/A N/A Measurements L x W x D (cm) Post Debridement Volume: 5.089 N/A N/A (cm) Periwound Skin Texture: Induration: Yes N/A N/A Excoriation: No Callus: No Crepitus: No Rash: No Scarring: No Periwound Skin Moisture: Maceration: No N/A N/A Dry/Scaly: No Periwound Skin Color: Atrophie Blanche: No N/A N/A Cyanosis: No Ecchymosis: No Erythema: No Hemosiderin Staining: No Mottled: No Pallor: No Rubor: No Temperature: No Abnormality N/A N/A Tenderness on Palpation: Yes N/A N/A Wound Preparation: Ulcer Cleansing: N/A N/A Rinsed/Irrigated with Saline Topical Anesthetic Applied: Other: lidocaine 4% Procedures Performed: Debridement N/A N/A Treatment Notes Wound #1 (Right Lower Leg) 1. Cleansed with: Clean wound with Normal Saline 2. Anesthetic Uriostegui, Johm M. ([id number]) Topical Lidocaine 4% cream to wound bed prior to debridement 4. Dressing Applied: Iodoflex 5. Secondary Dressing Applied Bordered Foam Dressing Electronic Signature(s) Signed: 11/15/2017 4:04:01 PM By: Lawanda Cousins Entered By: Lawanda Cousins on 11/15/2017 16:04:00 Bonn, Creg Jerilynn Mages ([id number]) -------------------------------------------------------------------------------- Multi-Disciplinary Care Plan Details Patient Name: Eduardo [name]. Date of Service: 11/15/2017 1:00 PM Medical Record Number: [id number] Patient Account Number: [id number] Date of Birth/Sex: 1931/05/05 (84 y.o. Male) Treating RN: [name] Primary Care Char Feltman: [name] Other Clinician: Referring Armour Villanueva: [name] Treating Ronnel Zuercher/Extender: Cathie Olden in Treatment: 1 Active Inactive ` Abuse / Safety / Falls / Self Care Management Nursing Diagnoses: Potential for  falls Goals: Patient will remain injury free related to falls Date Initiated: 11/08/2017 Target Resolution Date: 01/17/2018 Goal Status: Active Interventions: Assess fall risk on admission and as needed Notes: ` Orientation to the Wound Care Program Nursing Diagnoses: Knowledge deficit related to the wound healing center program Goals: Patient/caregiver will verbalize understanding of the [facility] Date Initiated: 11/08/2017 Target Resolution Date: 01/17/2018 Goal Status: Active Interventions: Provide education on orientation to the wound center Notes: ` Wound/Skin Impairment Nursing Diagnoses: Impaired tissue integrity Goals: Ulcer/skin breakdown will heal within 14 weeks Date Initiated: 11/08/2017 Target Resolution Date: 01/17/2018 Goal Status: Active Interventions: SEANPAUL, PREECE ([id number]) Assess patient/caregiver ability to obtain necessary supplies Assess patient/caregiver ability to perform ulcer/skin care regimen upon admission and as needed Assess ulceration(s) every visit Notes: Electronic Signature(s) Signed: 11/15/2017 4:22:23 PM By: [name] Entered By: [name] on 11/15/2017 13:29:41 Walts, Rilyn M. ([id number]) -------------------------------------------------------------------------------- Pain Assessment Details Patient Name: Eduardo [name]. Date of Service: 11/15/2017 1:00 PM Medical Record Number: [id number] Patient Account Number: [id number] Date of  Birth/Sex: [birth year] (84 y.o. Male) Treating RN: Roger Shelter Primary Care Aviyana Sonntag: [name] Other Clinician: Referring Nakisha Chai: [name] Treating Kaeden Depaz/Extender: Cathie Olden in Treatment: 1 Active Problems Location of Pain Severity and Description of Pain Patient Has Paino No Site Locations Pain Management and Medication Current Pain Management: Electronic Signature(s) Signed: 11/15/2017 4:27:05 PM By: Roger Shelter Entered By:  Roger Shelter on 11/15/2017 13:16:41 Squier, Koty M. ([id number]) -------------------------------------------------------------------------------- Patient/Caregiver Education Details Patient Name: Eduardo [name]. Date of Service: 11/15/2017 1:00 PM Medical Record Number: [id number] Patient Account Number: [id number] Date of Birth/Gender: 03-02-1931 (84 y.o. Male) Treating RN: Ahmed Prima Primary Care Physician: [name] Other Clinician: Referring Physician: [name] Treating Physician/Extender: Cathie Olden in Treatment: 1 Education Assessment Education Provided To: Patient Education Topics Provided Wound/Skin Impairment: Handouts: Caring for Your Ulcer, Skin Care Do's and Dont's, Other: change dressing as ordered Methods: Demonstration, Explain/Verbal Responses: State content correctly Electronic Signature(s) Signed: 11/15/2017 4:48:34 PM By: Alric Quan Entered By: Alric Quan on 11/15/2017 14:06:31 Noseworthy, Guhan M. ([id number]) -------------------------------------------------------------------------------- Wound Assessment Details Patient Name: Eduardo [name]. Date of Service: 11/15/2017 1:00 PM Medical Record Number: [id number] Patient Account Number: [id number] Date of Birth/Sex: 1930/08/11 (84 y.o. Male) Treating RN: Roger Shelter Primary Care Darielle Hancher: [name] Other Clinician: Referring Masaki Rothbauer: [name] Treating Diago Haik/Extender: Cathie Olden in Treatment: 1 Wound Status Wound Number: 1 Primary Trauma, Other Etiology: Wound Location: Right Lower Leg Wound Open Wounding Event: Trauma Status: Date Acquired: 10/15/2017 Comorbid Glaucoma, Chronic Obstructive Pulmonary Weeks Of Treatment: 1 History: Disease (COPD), Arrhythmia, Congestive Heart Clustered Wound: No Failure, Coronary Artery Disease, Myocardial Infarction, Neuropathy Photos Photo Uploaded By: Gretta Cool, BSN, RN, CWS, Kim on  11/15/2017 15:48:31 Wound Measurements Length: (cm) 3.6 Width: (cm) 3 Depth: (cm) 0.5 Area: (cm) 8.482 Volume: (cm) 4.241 % Reduction in Area: -68.7% % Reduction in Volume: -110.9% Epithelialization: None Tunneling: No Undermining: No Wound Description Full Thickness Without Exposed Support Foul Odo Classification: Structures Slough/F Wound Margin: Thickened Exudate Large Amount: Exudate Type: Serous Exudate Color: amber r After Cleansing: No ibrino Yes Wound Bed Granulation Amount: Small (1-33%) Exposed Structure Granulation Quality: Pink Fascia Exposed: No Necrotic Amount: Large (67-100%) Fat Layer (Subcutaneous Tissue) Exposed: Yes Necrotic Quality: Eschar, Adherent Slough Tendon Exposed: No Muscle Exposed: No Joint Exposed: No Pate, Ankush M. ([id number]) Bone Exposed: No Periwound Skin Texture Texture Color No Abnormalities Noted: No No Abnormalities Noted: No Callus: No Atrophie Blanche: No Crepitus: No Cyanosis: No Excoriation: No Ecchymosis: No Induration: Yes Erythema: No Rash: No Hemosiderin Staining: No Scarring: No Mottled: No Pallor: No Moisture Rubor: No No Abnormalities Noted: No Dry / Scaly: No Temperature / Pain Maceration: No Temperature: No Abnormality Tenderness on Palpation: Yes Wound Preparation Ulcer Cleansing: Rinsed/Irrigated with Saline Topical Anesthetic Applied: Other: lidocaine 4%, Treatment Notes Wound #1 (Right Lower Leg) 1. Cleansed with: Clean wound with Normal Saline 2. Anesthetic Topical Lidocaine 4% cream to wound bed prior to debridement 4. Dressing Applied: Iodoflex 5. Secondary Dressing Applied Bordered Foam Dressing Electronic Signature(s) Signed: 11/15/2017 4:27:05 PM By: Roger Shelter Entered By: Roger Shelter on 11/15/2017 13:18:50 Muse, Somtochukwu Jerilynn Mages ([id number]) -------------------------------------------------------------------------------- [place] Details Patient Name: Eduardo [name]. Date of Service: 11/15/2017 1:00 PM Medical Record Number: [id number] Patient Account Number: [id number] Date of Birth/Sex: 1931-04-26 (84 y.o. Male) Treating RN: Roger Shelter Primary Care Darl Brisbin: [name] Other Clinician: Referring Ahsley Attwood: [name] Treating Wells Mabe/Extender: Cathie Olden in Treatment: 1 Vital Signs Time Taken: 13:16 Temperature (F): 98.5 Height (in): 62 Pulse (  bpm): 74 Weight (lbs): 153.7 Respiratory Rate (breaths/min): 16 Body Mass Index (BMI): 28.1 Blood Pressure (mmHg): 129/64 Reference Range: 80 - 120 mg / dl Electronic Signature(s) Signed: 11/15/2017 4:27:05 PM By: Roger Shelter Entered By: Roger Shelter on 11/15/2017 13:17:21

## 2017-11-19 ENCOUNTER — Ambulatory Visit (INDEPENDENT_AMBULATORY_CARE_PROVIDER_SITE_OTHER): Payer: PPO | Admitting: Vascular Surgery

## 2017-11-19 ENCOUNTER — Encounter (INDEPENDENT_AMBULATORY_CARE_PROVIDER_SITE_OTHER): Payer: Self-pay | Admitting: Vascular Surgery

## 2017-11-19 VITALS — BP 143/71 | HR 83 | Resp 14 | Ht 62.0 in | Wt 150.0 lb

## 2017-11-19 DIAGNOSIS — I10 Essential (primary) hypertension: Secondary | ICD-10-CM

## 2017-11-19 DIAGNOSIS — S81801A Unspecified open wound, right lower leg, initial encounter: Secondary | ICD-10-CM

## 2017-11-19 DIAGNOSIS — E785 Hyperlipidemia, unspecified: Secondary | ICD-10-CM

## 2017-11-19 NOTE — Progress Notes (Signed)
Subjective:    Patient ID: Eduardo [name], male    DOB: 02/05/1931, 84 y.o.   MRN: [id number] Chief Complaint  Patient presents with  . New Patient (Initial Visit)    Non-healing wound on Right leg   Presents as a new patient referred by the [facility] wound center [facility] Joaquim Lai) for evaluation of peripheral artery disease.  The patient seen with his wife.  The patient endorses a history of sustaining a wound to the medial aspect of the right calf.  He has been seen by the wound center for a few weeks and has noted improvement to the wound.  The patient was referred to our office to undergo arterial studies to assess his arterial status.  The patient notes neuropathy in his feet and toes however denies any claudication-like symptoms or rest pain.  Denies any new ulcer formation to the right lower extremity or left lower extremity.  Patient denies any necrotic tissue or drainage from the wound site.  Patient denies any fever, nausea vomiting.  Review of Systems  Constitutional: Negative.   HENT: Negative.   Eyes: Negative.   Respiratory: Negative.   Cardiovascular: Negative.   Gastrointestinal: Negative.   Endocrine: Negative.   Genitourinary: Negative.   Musculoskeletal: Negative.   Skin: Positive for wound.  Allergic/Immunologic: Negative.   Neurological: Negative.   Hematological: Negative.   Psychiatric/Behavioral: Negative.       Objective:   Physical Exam  Constitutional: He is oriented to person, place, and time. He appears well-developed and well-nourished. No distress.  HENT:  Head: Normocephalic and atraumatic.  Right Ear: External ear normal.  Left Ear: External ear normal.  Eyes: Pupils are equal, round, and reactive to light. Conjunctivae are normal.  Neck: Normal range of motion.  Cardiovascular: Normal rate, regular rhythm, normal heart sounds and intact distal pulses.  Pulses:      Radial pulses are 2+ on the right side, and 2+ on the left  side.  Hard to palpate pedal pulses however the bilateral feet are warm.  Pulmonary/Chest: Effort normal and breath sounds normal.  Musculoskeletal: Normal range of motion. He exhibits edema (Mild right lower extremity edema.  No left lower extremity edema.).  Neurological: He is alert and oriented to person, place, and time.  Skin: He is not diaphoretic.  Right calf: 4 cm x 3 cm circular ulceration noted to the medial aspect of the right calf.  No cellulitis to the leg.  No necrotic tissue noted.  No drainage noted.  Psychiatric: He has a normal mood and affect. His behavior is normal. Judgment and thought content normal.  Vitals reviewed.  BP (!) 143/71 (BP Location: Right Arm, Patient Position: Sitting)   Pulse 83   Resp 14   Ht 5\' 2"  (1.575 m)   Wt 150 lb (68 kg)   BMI 27.44 kg/m   Past Medical History:  Diagnosis Date  . Allergy   . Anemia 2012, 08/2015   chronic. Acute due to large hematoma 2013. 2017: due to gastric AVM bleeding.   . Anxiety   . Atrial fibrillation ([place]) 2012   a.  CHA2DS2VASc = 6-->eliquis;  b. 12/2015 Echo: EF 60-65%, no rwma, LVH, mild AS/MS/MR, sev dil LA, mild TR, PASP 7mmHg.  Marland Kitchen CAD (coronary artery disease)    a. 06/2010 Cath: LM nl, LAD 50p/m, LCX 32m, RCA 50p/m -->catheter dissection but good flow, 70d, RPDA 50-70;  b. 06/2010 Relook cath due to bradycardia and inf ST elev:  RCA dissection @ ostium extending into coronary cusp and prox RCA, Ao dissection-->less staining than previously-->Med Rx.  Marland Kitchen COPD (chronic obstructive pulmonary disease) ([place]) 2013   exertional dyspnea  . Gastric angiodysplasia with hemorrhage 08/2015  . Hx of colonic polyps 2001, 2011   adenomatous 2001. HP 2001, 2011.  Marland Kitchen Hyperlipidemia   . Myocardial infarction ([place]) 06/2010.  12/2011.   "twice; 1 day apart; during/after cath". NQMI in setting severe anemia 2013.   Marland Kitchen Neuropathy 2014   in feet, likely due to spinal stenosis, DDD, HNP  . Osteoarthritis 2002   spinal stenosis,  spondylolisthesis, disc protrusion multilevel in lumbar spine  . Stroke ([place]) 2016  . Upper GI bleed 08/2015   EGD: 2 small gastric AVMs, 1 actively bleeding.  Both treated with APC ablation, clipping of the bleeder   Social History   Socioeconomic History  . Marital status: Married    Spouse name: Not on file  . Number of children: 4  . Years of education: some college  . Highest education level: Not on file  Occupational History  . Occupation: Retired Chief Strategy Officer the job trainin)  Social Needs  . Financial resource strain: Not on file  . Food insecurity:    Worry: Not on file    Inability: Not on file  . Transportation needs:    Medical: Not on file    Non-medical: Not on file  Tobacco Use  . Smoking status: Former Smoker    Packs/day: 2.50    Years: 30.00    Pack years: 75.00    Types: Cigarettes    Last attempt to quit: 06/11/1978    Years since quitting: 39.4  . Smokeless tobacco: Never Used  Substance and Sexual Activity  . Alcohol use: No    Comment: QUIT [id number] alcoholic  . Drug use: No  . Sexual activity: Not Currently  Lifestyle  . Physical activity:    Days per week: Not on file    Minutes per session: Not on file  . Stress: Not on file  Relationships  . Social connections:    Talks on phone: Not on file    Gets together: Not on file    Attends religious service: Not on file    Active member of club or organization: Not on file    Attends meetings of clubs or organizations: Not on file    Relationship status: Not on file  . Intimate partner violence:    Fear of current or ex partner: Not on file    Emotionally abused: Not on file    Physically abused: Not on file    Forced sexual activity: Not on file  Other Topics Concern  . Not on file  Social History Narrative   Grew up in [place], [place].  Lives in [place].   Married---2nd   2 children ([place], [place]  and near [place])--2 stepchildren   Former Smoker--quit in 1980   Alcohol use-no.  Quit in 1980--alcoholic      Has living will.   Has designated wife, then step daughter [name] have health care POA.   Would accept resuscitation attempts.   No feeding tube if cognitively unaware.   Past Surgical History:  Procedure Laterality Date  . APPENDECTOMY    . CARDIAC CATHETERIZATION  2012   50% LAD and luminal irreg in RCA, 1/12  Post cath MI from damage  . CARPAL TUNNEL RELEASE     left  . CARPAL TUNNEL RELEASE  10/11/11  Dr Rush Barer  . CATARACT EXTRACTION W/ INTRAOCULAR LENS IMPLANT  2/13   Dr [name]  . COLONOSCOPY  2001, 02/2010   Dr Deatra Ina.   . ESOPHAGOGASTRODUODENOSCOPY N/A 08/15/2015   [name] MD; 2 small gastric AVMs, 1 actively bleeding.  Both treated with APC ablation, clipping of the bleeder  . FOOT FRACTURE SURGERY     right heel repair  . FRACTURE SURGERY    . KNEE ARTHROSCOPY     left, right knee 10/2009  . MASTOIDECTOMY     x3 on right  . NASAL SEPTUM SURGERY     nasal septal repair  . PENILE PROSTHESIS  REMOVAL    . PENILE PROSTHESIS IMPLANT     x 2--got infection after 2nd and had to remove  . PILONIDAL CYST / SINUS EXCISION    . SHOULDER ARTHROSCOPY     right  . TONSILLECTOMY AND ADENOIDECTOMY     Family History  Problem Relation Age of Onset  . Cancer Mother        ? leukemia  . Heart attack Father   . Stroke Maternal Grandmother   . Diabetes Neg Hx   . Hypertension Neg Hx    Allergies  Allergen Reactions  . Penicillins Itching    Has patient had a PCN reaction causing immediate rash, facial/tongue/throat swelling, SOB or lightheadedness with hypotension: Yes Has patient had a PCN reaction causing severe rash involving mucus membranes or skin necrosis: No Has patient had a PCN reaction that required hospitalization No Has patient had a PCN reaction occurring within the last 10 years: No If all of the above answers are "NO", then may proceed with Cephalosporin use.   . Sulfonamide Derivatives Other (See  Comments)    "it affected my kidneys"  . Tape Other (See Comments)    Arms black and blue, tears skin.  Please use "paper" tape      Assessment & Plan:  Presents as a new patient referred by the [facility] wound center [facility]aquim Lai) for evaluation of peripheral artery disease.  The patient seen with his wife.  The patient endorses a history of sustaining a wound to the medial aspect of the right calf.  He has been seen by the wound center for a few weeks and has noted improvement to the wound.  The patient was referred to our office to undergo arterial studies to assess his arterial status.  The patient notes neuropathy in his feet and toes however denies any claudication-like symptoms or rest pain.  Denies any new ulcer formation to the right lower extremity or left lower extremity.  Patient denies any necrotic tissue or drainage from the wound site.  Patient denies any fever, nausea vomiting.  1. Wound of right leg, initial encounter - New Patient with slow healing wound to the right calf Will have the patient undergo ABI and bilateral lower extremity arterial studies as per the request of the wound center to assess his arterial status Patient with multiple risk factors for peripheral artery disease Hard to palpate pedal pulses to the bilateral feet I have discussed with the patient at length the risk factors for and pathogenesis of atherosclerotic disease and encouraged a healthy diet, regular exercise regimen and blood pressure / glucose control.  The patient was encouraged to call the office in the interim if he experiences any claudication like symptoms, rest pain or ulcers to his feet / toes.  - US ARTERIAL LOWER EXTREMITY DUPLEX BILATERAL; Future -  US ARTERIAL SEG MULTIPLE LE (ABI, SEGMENTAL PRESSURES, PVR'S); Future  2. Dyslipidemia - Stable Encouraged good control as its slows the progression of atherosclerotic disease  3. Essential hypertension -  Stable Encouraged good control as its slows the progression of atherosclerotic disease  Current Outpatient Medications on File Prior to Visit  Medication Sig Dispense Refill  . acetaminophen (TYLENOL) 650 MG CR tablet Take 1,300 mg by mouth 2 (two) times daily.    Marland Kitchen albuterol (PROAIR HFA) 108 (90 Base) MCG/ACT inhaler Inhale 2 puffs into the lungs every 6 (six) hours as needed for wheezing or shortness of breath. 18 g 0  . albuterol (PROVENTIL) (2.5 MG/3ML) 0.083% nebulizer solution use 1 vial in nebulizer every 4 hours as needed for wheezing or shortness of breath 270 mL 2  . atorvastatin (LIPITOR) 80 MG tablet TAKE ONE TABLET BY MOUTH ONE TIME DAILY  90 tablet 3  . azelastine (ASTELIN) 0.1 % nasal spray Place 2 sprays into both nostrils at bedtime. Use in each nostril as directed    . B Complex-C (SUPER B COMPLEX PO) Take 1 tablet by mouth at bedtime.     . cetirizine (ZYRTEC) 10 MG tablet Take 10 mg by mouth at bedtime.     . cholecalciferol (VITAMIN D) 1000 units tablet Take 1,000 Units by mouth daily.    Marland Kitchen ELIQUIS 5 MG TABS tablet TAKE 1 TABLET BY MOUTH 2 TIMES DAILY 180 tablet 1  . escitalopram (LEXAPRO) 20 MG tablet Take 20 mg by mouth at bedtime.     . fenofibrate 160 MG tablet TAKE 1 TABLET BY MOUTH AT BEDTIME. 90 tablet 3  . furosemide (LASIX) 20 MG tablet TAKE 1 TABLET BY MOUTH DAILY 90 tablet 3  . hydrALAZINE (APRESOLINE) 10 MG tablet Take 1 tablet (10 mg total) by mouth 3 (three) times daily. 270 tablet 3  . ipratropium (ATROVENT) 0.03 % nasal spray PLACE 2 SPRAYS INTO BOTH NOSTRILS AT BEDTIME. 90 mL 3  . IRON PO Take by mouth.    . lamoTRIgine (LAMICTAL) 150 MG tablet Take 150 mg by mouth at bedtime. Reported on 08/14/2015    . montelukast (SINGULAIR) 10 MG tablet Take 1 tablet (10 mg total) by mouth at bedtime. 30 tablet 11  . Multiple Vitamin (MULTIVITAMIN) tablet Take 1 tablet by mouth at bedtime.     . pantoprazole (PROTONIX) 40 MG tablet TAKE 1 TABLET BY MOUTH DAILY BEFORE  BREAKFAST. 90 tablet 3  . Tiotropium Bromide-Olodaterol (STIOLTO RESPIMAT) 2.5-2.5 MCG/ACT AERS Inhale 2 puffs into the lungs daily. 12 Inhaler 3  . triamcinolone cream (KENALOG) 0.1 % Apply 1 application topically 2 (two) times daily as needed (for skin).     . Turmeric 500 MG TABS Take 2 tablets by mouth.    . verapamil (CALAN-SR) 240 MG CR tablet TAKE 1 TABLET BY MOUTH AT BEDTIME 90 tablet 1  . levofloxacin (LEVAQUIN) 250 MG tablet Take 1 tablet (250 mg total) by mouth daily. (Patient not taking: Reported on 11/19/2017) 7 tablet 0   No current facility-administered medications on file prior to visit.    There are no Patient Instructions on file for this visit. No follow-ups on file.  Anayi Bricco A Casady Voshell, PA-C

## 2017-11-21 ENCOUNTER — Encounter: Payer: Self-pay | Admitting: Internal Medicine

## 2017-11-21 ENCOUNTER — Ambulatory Visit: Payer: Self-pay | Admitting: *Deleted

## 2017-11-21 ENCOUNTER — Ambulatory Visit (INDEPENDENT_AMBULATORY_CARE_PROVIDER_SITE_OTHER): Payer: PPO | Admitting: Internal Medicine

## 2017-11-21 VITALS — BP 126/66 | HR 84 | Temp 97.4°F | Ht 62.0 in | Wt 150.0 lb

## 2017-11-21 DIAGNOSIS — G459 Transient cerebral ischemic attack, unspecified: Secondary | ICD-10-CM | POA: Diagnosis not present

## 2017-11-21 NOTE — Assessment & Plan Note (Signed)
Unclear event On eliquis for atrial fib No clear neuro findings now---will hold off on MRI Will check carotids---- consider vascular evaluation if sig blockage If recurrent events, would consider restarting aspirin

## 2017-11-21 NOTE — Telephone Encounter (Signed)
Okay to add on at 4:45PM

## 2017-11-21 NOTE — Telephone Encounter (Signed)
Patient's wife notified of 4:45 appointment.  [name] wanted to thank [facility] for adding patient on today.

## 2017-11-21 NOTE — Telephone Encounter (Signed)
Larene Beach RN from [facility] called and said on 11/20/17 pt had brief period of confusion and weakness; none today but feeling fatigued. pts wife wants pt seen today by Dr [name]. FYI to Dr [name] to see if pt could be added on. pts wife added on that pt cannot come until after 4 pm. pts wife aware Dr [name] out of office until this afternoon and request cb.

## 2017-11-21 NOTE — Telephone Encounter (Signed)
Pt and pt's wife calling with complaints of feeling fatigued for the past couple of months. Pt states while out for lunch yesterday with a friend he experienced a period of confusion and disorientation. Pt states he felt like he was going to fall but when he sat down he felt better. Pt states that while driving home after lunch he felt as if he had to step on the breaks frequently. Pt describes the feeling as "if I wasn't paying attention." Pt denies any dizziness, confusion or difficulty breathing at this time. Pt denies being on any new medications. Pt states that every once in a while he feels a dull pain in his chest that last less than a second but feels as if it is a muscular pain. Pt denies having chest pain at this time.Pt's wife wants to know if pt could be seen for an appt with Dr. [name] if possible. Contacted FC, Rena in the office who spoke with Dr. Alla German CMA regarding being worked in for an appt. Return call will be given to pt by office staff if there is availability for the pt to be seen today for an appt. Pt's wife also mentions that she has an appt at 1:30pm to day and should be back no later than 3pm. Pt can be contacted at [phone] Reason for Disposition . [1] Fatigue (i.e., tires easily, decreased energy) AND [2] persists > 1 week  Answer Assessment - Initial Assessment Questions 1. DESCRIPTION: "Describe how you are feeling."    Fatigue has been going on for while, period of confusion on yesterday but none at this time 2. SEVERITY: "How bad is it?"  "Can you stand and walk?"   - MILD - Feels weak or tired, but does not interfere with work, school or normal activities   - [place] to stand and walk; weakness interferes with work, school, or normal activities   - SEVERE - Unable to stand or walk     mild 3. ONSET:  "When did the weakness begin?"     Period of weakness yesterday after lunch, but none voiced at this time. Pt states he has been feeling fatigued for a  couple of months 4. CAUSE: "What do you think is causing the weakness?"     unknown 5. MEDICINES: "Have you recently started a new medicine or had a change in the amount of a medicine?"     No 6. OTHER SYMPTOMS: "Do you have any other symptoms?" (e.g., chest pain, fever, cough, SOB, vomiting, diarrhea, bleeding)     No  Protocols used: WEAKNESS (GENERALIZED) AND FATIGUE-A-AH

## 2017-11-21 NOTE — Progress Notes (Signed)
Subjective:    Patient ID: Eduardo [name], male    DOB: Jul 02, 1930, 84 y.o.   MRN: [id number]  HPI Here with wife due to some recent spells Wife is concerned about TIAs  Was at lunch with friend yesterday---suddenly totally confused Suddenly felt he had to sit down, and "mind was normal for me" Sat down--then got up and didn't feel stable Sat down in car---decided to drive home himself On the drive, "pieces were missing". Attention was poor---- overly careful and responsive Some level of disorientation May have had similar spell about 6 weeks ago  No aphasia No dysphagia No arm or leg weakness---but felt unstable and had to sit down No facial droop  Felt better by the time he got home---within 30 minutes  Current Outpatient Medications on File Prior to Visit  Medication Sig Dispense Refill  . acetaminophen (TYLENOL) 650 MG CR tablet Take 1,300 mg by mouth 2 (two) times daily.    Marland Kitchen albuterol (PROAIR HFA) 108 (90 Base) MCG/ACT inhaler Inhale 2 puffs into the lungs every 6 (six) hours as needed for wheezing or shortness of breath. 18 g 0  . albuterol (PROVENTIL) (2.5 MG/3ML) 0.083% nebulizer solution use 1 vial in nebulizer every 4 hours as needed for wheezing or shortness of breath 270 mL 2  . atorvastatin (LIPITOR) 80 MG tablet TAKE ONE TABLET BY MOUTH ONE TIME DAILY  90 tablet 3  . azelastine (ASTELIN) 0.1 % nasal spray Place 2 sprays into both nostrils at bedtime. Use in each nostril as directed    . B Complex-C (SUPER B COMPLEX PO) Take 1 tablet by mouth at bedtime.     . cetirizine (ZYRTEC) 10 MG tablet Take 10 mg by mouth at bedtime.     . cholecalciferol (VITAMIN D) 1000 units tablet Take 1,000 Units by mouth daily.    Marland Kitchen ELIQUIS 5 MG TABS tablet TAKE 1 TABLET BY MOUTH 2 TIMES DAILY 180 tablet 1  . escitalopram (LEXAPRO) 20 MG tablet Take 20 mg by mouth at bedtime.     . fenofibrate 160 MG tablet TAKE 1 TABLET BY MOUTH AT BEDTIME. 90 tablet 3  . furosemide (LASIX) 20 MG  tablet TAKE 1 TABLET BY MOUTH DAILY 90 tablet 3  . hydrALAZINE (APRESOLINE) 10 MG tablet Take 1 tablet (10 mg total) by mouth 3 (three) times daily. 270 tablet 3  . ipratropium (ATROVENT) 0.03 % nasal spray PLACE 2 SPRAYS INTO BOTH NOSTRILS AT BEDTIME. 90 mL 3  . IRON PO Take by mouth.    . lamoTRIgine (LAMICTAL) 150 MG tablet Take 150 mg by mouth at bedtime. Reported on 08/14/2015    . levofloxacin (LEVAQUIN) 250 MG tablet Take 1 tablet (250 mg total) by mouth daily. 7 tablet 0  . montelukast (SINGULAIR) 10 MG tablet Take 1 tablet (10 mg total) by mouth at bedtime. 30 tablet 11  . Multiple Vitamin (MULTIVITAMIN) tablet Take 1 tablet by mouth at bedtime.     . pantoprazole (PROTONIX) 40 MG tablet TAKE 1 TABLET BY MOUTH DAILY BEFORE BREAKFAST. 90 tablet 3  . Tiotropium Bromide-Olodaterol (STIOLTO RESPIMAT) 2.5-2.5 MCG/ACT AERS Inhale 2 puffs into the lungs daily. 12 Inhaler 3  . triamcinolone cream (KENALOG) 0.1 % Apply 1 application topically 2 (two) times daily as needed (for skin).     . Turmeric 500 MG TABS Take 2 tablets by mouth.    . verapamil (CALAN-SR) 240 MG CR tablet TAKE 1 TABLET BY MOUTH AT BEDTIME 90 tablet 1  No current facility-administered medications on file prior to visit.     Allergies  Allergen Reactions  . Penicillins Itching    Has patient had a PCN reaction causing immediate rash, facial/tongue/throat swelling, SOB or lightheadedness with hypotension: Yes Has patient had a PCN reaction causing severe rash involving mucus membranes or skin necrosis: No Has patient had a PCN reaction that required hospitalization No Has patient had a PCN reaction occurring within the last 10 years: No If all of the above answers are "NO", then may proceed with Cephalosporin use.   . Sulfonamide Derivatives Other (See Comments)    "it affected my kidneys"  . Tape Other (See Comments)    Arms black and blue, tears skin.  Please use "paper" tape    Past Medical History:  Diagnosis  Date  . Allergy   . Anemia 2012, 08/2015   chronic. Acute due to large hematoma 2013. 2017: due to gastric AVM bleeding.   . Anxiety   . Atrial fibrillation ([place]) 2012   a.  CHA2DS2VASc = 6-->eliquis;  b. 12/2015 Echo: EF 60-65%, no rwma, LVH, mild AS/MS/MR, sev dil LA, mild TR, PASP 11mmHg.  Marland Kitchen CAD (coronary artery disease)    a. 06/2010 Cath: LM nl, LAD 50p/m, LCX 65m, RCA 50p/m -->catheter dissection but good flow, 70d, RPDA 50-70;  b. 06/2010 Relook cath due to bradycardia and inf ST elev: RCA dissection @ ostium extending into coronary cusp and prox RCA, Ao dissection-->less staining than previously-->Med Rx.  Marland Kitchen COPD (chronic obstructive pulmonary disease) ([place]) 2013   exertional dyspnea  . Gastric angiodysplasia with hemorrhage 08/2015  . Hx of colonic polyps 2001, 2011   adenomatous 2001. HP 2001, 2011.  Marland Kitchen Hyperlipidemia   . Myocardial infarction ([place]) 06/2010.  12/2011.   "twice; 1 day apart; during/after cath". NQMI in setting severe anemia 2013.   Marland Kitchen Neuropathy 2014   in feet, likely due to spinal stenosis, DDD, HNP  . Osteoarthritis 2002   spinal stenosis, spondylolisthesis, disc protrusion multilevel in lumbar spine  . Stroke ([place]) 2016  . Upper GI bleed 08/2015   EGD: 2 small gastric AVMs, 1 actively bleeding.  Both treated with APC ablation, clipping of the bleeder    Past Surgical History:  Procedure Laterality Date  . APPENDECTOMY    . CARDIAC CATHETERIZATION  2012   50% LAD and luminal irreg in RCA, 1/12  Post cath MI from damage  . CARPAL TUNNEL RELEASE     left  . CARPAL TUNNEL RELEASE  10/11/11   Dr Rush Barer  . CATARACT EXTRACTION W/ INTRAOCULAR LENS IMPLANT  2/13   Dr [name]  . COLONOSCOPY  2001, 02/2010   Dr Deatra Ina.   . ESOPHAGOGASTRODUODENOSCOPY N/A 08/15/2015   [name] MD; 2 small gastric AVMs, 1 actively bleeding.  Both treated with APC ablation, clipping of the bleeder  . FOOT FRACTURE SURGERY     right heel repair  . FRACTURE SURGERY    .  KNEE ARTHROSCOPY     left, right knee 10/2009  . MASTOIDECTOMY     x3 on right  . NASAL SEPTUM SURGERY     nasal septal repair  . PENILE PROSTHESIS  REMOVAL    . PENILE PROSTHESIS IMPLANT     x 2--got infection after 2nd and had to remove  . PILONIDAL CYST / SINUS EXCISION    . SHOULDER ARTHROSCOPY     right  . TONSILLECTOMY AND ADENOIDECTOMY      Family History  Problem Relation Age of Onset  . Cancer Mother        ? leukemia  . Heart attack Father   . Stroke Maternal Grandmother   . Diabetes Neg Hx   . Hypertension Neg Hx     Social History   Socioeconomic History  . Marital status: Married    Spouse name: Not on file  . Number of children: 4  . Years of education: some college  . Highest education level: Not on file  Occupational History  . Occupation: Retired Chief Strategy Officer the job trainin)  Social Needs  . Financial resource strain: Not on file  . Food insecurity:    Worry: Not on file    Inability: Not on file  . Transportation needs:    Medical: Not on file    Non-medical: Not on file  Tobacco Use  . Smoking status: Former Smoker    Packs/day: 2.50    Years: 30.00    Pack years: 75.00    Types: Cigarettes    Last attempt to quit: 06/11/1978    Years since quitting: 39.4  . Smokeless tobacco: Never Used  Substance and Sexual Activity  . Alcohol use: No    Comment: QUIT 1610 alcoholic  . Drug use: No  . Sexual activity: Not Currently  Lifestyle  . Physical activity:    Days per week: Not on file    Minutes per session: Not on file  . Stress: Not on file  Relationships  . Social connections:    Talks on phone: Not on file    Gets together: Not on file    Attends religious service: Not on file    Active member of club or organization: Not on file    Attends meetings of clubs or organizations: Not on file    Relationship status: Not on file  . Intimate partner violence:    Fear of current or ex partner: Not on file    Emotionally abused:  Not on file    Physically abused: Not on file    Forced sexual activity: Not on file  Other Topics Concern  . Not on file  Social History Narrative   Grew up in [place], [place].  Lives in [place].   Married---2nd   2 children ([place], [place]  and near [place])--2 stepchildren   Former Smoker--quit in 1980   Alcohol use-no. Quit in 1980--alcoholic      Has living will.   Has designated wife, then step daughter [name] have health care POA.   Would accept resuscitation attempts.   No feeding tube if cognitively unaware.   Review of Systems  Wife is concerned about his driving---still goes out to lunch and to the gym No fever Slow improvement in leg injury Getting vascular evaluation also Trying to elevate more      Objective:   Physical Exam  Constitutional: He is oriented to person, place, and time. He appears well-developed. No distress.  Cardiovascular: Normal rate. Exam reveals no gallop.  No murmur heard. irregular  Respiratory: Effort normal and breath sounds normal. No respiratory distress. He has no wheezes. He has no rales.  Neurological: He is oriented to person, place, and time. He has normal strength. He displays no tremor. No cranial nerve deficit. Coordination normal.  Antalgic type gait Slightly unstable with Romberg (seems related to his legs though)           Assessment & Plan:

## 2017-11-22 ENCOUNTER — Encounter: Payer: PPO | Admitting: Physician Assistant

## 2017-11-22 ENCOUNTER — Other Ambulatory Visit: Payer: Self-pay | Admitting: Internal Medicine

## 2017-11-22 DIAGNOSIS — R2990 Unspecified symptoms and signs involving the nervous system: Secondary | ICD-10-CM

## 2017-11-22 DIAGNOSIS — G459 Transient cerebral ischemic attack, unspecified: Secondary | ICD-10-CM

## 2017-11-22 DIAGNOSIS — L97812 Non-pressure chronic ulcer of other part of right lower leg with fat layer exposed: Secondary | ICD-10-CM | POA: Diagnosis not present

## 2017-11-25 ENCOUNTER — Encounter: Payer: Self-pay | Admitting: Internal Medicine

## 2017-11-27 ENCOUNTER — Ambulatory Visit
Admission: RE | Admit: 2017-11-27 | Discharge: 2017-11-27 | Disposition: A | Discharge status: Home / Self Care | Payer: PPO | Source: Ambulatory Visit | Attending: Vascular Surgery | Admitting: Vascular Surgery

## 2017-11-27 DIAGNOSIS — R2990 Unspecified symptoms and signs involving the nervous system: Secondary | ICD-10-CM | POA: Diagnosis not present

## 2017-11-27 DIAGNOSIS — S81801A Unspecified open wound, right lower leg, initial encounter: Secondary | ICD-10-CM

## 2017-11-27 DIAGNOSIS — X58XXXA Exposure to other specified factors, initial encounter: Secondary | ICD-10-CM | POA: Diagnosis not present

## 2017-11-27 DIAGNOSIS — G459 Transient cerebral ischemic attack, unspecified: Secondary | ICD-10-CM | POA: Diagnosis not present

## 2017-11-27 DIAGNOSIS — I6523 Occlusion and stenosis of bilateral carotid arteries: Secondary | ICD-10-CM | POA: Diagnosis not present

## 2017-11-27 DIAGNOSIS — Z87828 Personal history of other (healed) physical injury and trauma: Secondary | ICD-10-CM | POA: Diagnosis not present

## 2017-11-27 DIAGNOSIS — I6522 Occlusion and stenosis of left carotid artery: Secondary | ICD-10-CM | POA: Diagnosis not present

## 2017-11-27 NOTE — Progress Notes (Signed)
AMEEN, [name] ([id number]) Visit Report for 11/22/2017 Chief Complaint Document Details Patient Name: Eduardo [name], Eduardo [name]. Date of Service: 11/22/2017 11:00 AM Medical Record Number: [id number] Patient Account Number: [id number] Date of Birth/Sex: April 21, 1931 (84 y.o. M) Treating RN: [name] Primary Care Provider: [name] Other Clinician: Referring Provider: [name] Treating Provider/Extender: Melburn Hake, Resean Brander Weeks in Treatment: 2 Information Obtained from: Patient Chief Complaint Right lower leg ulcer due to trauma Electronic Signature(s) Signed: 11/24/2017 11:18:14 PM By: Worthy Keeler PA-C Entered By: Worthy Keeler on 11/22/2017 10:52:08 Herrington, Saabir M. ([id number]) -------------------------------------------------------------------------------- HPI Details Patient Name: Eduardo [name]. Date of Service: 11/22/2017 11:00 AM Medical Record Number: [id number] Patient Account Number: [id number] Date of Birth/Sex: 02/11/1931 (84 y.o. M) Treating RN: [name] Primary Care Provider: [name] Other Clinician: Referring Provider: [name] Treating Provider/Extender: Melburn Hake, Jerelyn Trimarco Weeks in Treatment: 2 History of Present Illness HPI Description: 11/08/17 on evaluation today patient presents initially concerning a right dramatic lower extremity ulcer/skin tear which occurred three weeks ago. He states that he initially was seen in urgent care where the area was sutured closed. The sutures stayed in for two weeks he tells me. Subsequently he was seen at Dr. Alla German office where the sutures were removed and Steri-Strips were placed over the next five days. Subsequently these were also removed and unfortunately at this time it was noted that the patient had a dehisced larger wound that required further attention. The patient and his wife actually requested referral to the wound care center in [place]. [name] made the referral to [place].  Subsequently the patient does have a history of atrial fibrillation, cardiovascular disease, COPD, stroke, and is on long-term Eliquis. There's no history of diabetes that he states. Subsequently he has no other major medical problems currently. No fevers, chills, nausea, or vomiting noted at this time. 11/14/17-He is seen in follow-up evaluation for right posterior lower extremity ulcer. There is improvement with granulation tissue present; debridement. We will continue with same treatment plan he'll follow-up next week. The vascular office did call to establish an appointment, but he forgot that we had referred him and did not make an appointment. He was encouraged to contact the office to schedule an appointment for evaluation. 11/22/17 on evaluation today patient actually appears to be doing well in regard to his ulcer. This has cleaned up very nicely in my pinion I do believe the Iodoflex has been of benefit for him. He actually does have his arterial study which is scheduled for 11/27/17. In general he's having no significant discomfort mainly just with cleansing of the wound but the dressings themselves don't seem to be causing him problems. Electronic Signature(s) Signed: 11/24/2017 11:18:14 PM By: Worthy Keeler PA-C Entered By: Worthy Keeler on 11/24/2017 23:14:41 Gilmore, Sherlock M. ([id number]) -------------------------------------------------------------------------------- Physical Exam Details Patient Name: Eduardo, [name] Date of Service: 11/22/2017 11:00 AM Medical Record Number: [id number] Patient Account Number: [id number] Date of Birth/Sex: July 04, 1930 (84 y.o. M) Treating RN: [name] Primary Care Provider: [name] Other Clinician: Referring Provider: [name] Treating Provider/Extender: STONE III, Sanye Ledesma Weeks in Treatment: 2 Constitutional Well-nourished and well-hydrated in no acute distress. Respiratory normal breathing without difficulty.  clear to auscultation bilaterally. Cardiovascular regular rate and rhythm with normal S1, S2. Psychiatric this patient is able to make decisions and demonstrates good insight into disease process. Alert and Oriented x 3. pleasant and cooperative. Notes On inspection patient did have some Slough noted in the surface of the wound  bed fortunately this was not too significant which is great news. He's been tolerating the dressing changes in general without complication. Electronic Signature(s) Signed: 11/24/2017 11:18:14 PM By: Worthy Keeler PA-C Entered By: Worthy Keeler on 11/24/2017 23:15:16 Pullin, Yao Jerilynn Mages ([id number]) -------------------------------------------------------------------------------- Physician Orders Details Patient Name: Eduardo [name]. Date of Service: 11/22/2017 11:00 AM Medical Record Number: [id number] Patient Account Number: [id number] Date of Birth/Sex: [birth year] (84 y.o. M) Treating RN: [name] Primary Care Provider: [name] Other Clinician: Referring Provider: [name] Treating Provider/Extender: Melburn Hake, Odarius Dines Weeks in Treatment: 2 Verbal / Phone Orders: No Diagnosis Coding ICD-10 Coding Code Description [phone] Non-pressure chronic ulcer of other part of right lower leg with fat layer exposed I48.0 Paroxysmal atrial fibrillation Z79.01 Long term (current) use of anticoagulants J44.9 Chronic obstructive pulmonary disease, unspecified I25.10 Atherosclerotic heart disease of native coronary artery without angina pectoris Wound Cleansing Wound #1 Right Lower Leg o Clean wound with Normal Saline. Anesthetic (add to Medication List) Wound #1 Right Lower Leg o Topical Lidocaine 4% cream applied to wound bed prior to debridement (In Clinic Only). o Benzocaine Topical Anesthetic Spray applied to wound bed prior to debridement (In Clinic Only). Primary Wound Dressing Wound #1 Right Lower Leg o Iodoflex Secondary  Dressing Wound #1 Right Lower Leg o Boardered Foam Dressing Dressing Change Frequency Wound #1 Right Lower Leg o Change Dressing Monday, Wednesday, Friday Follow-up Appointments Wound #1 Right Lower Leg o Return Appointment in 1 week. Edema Control Wound #1 Right Lower Leg o Other: - tubigrip Additional Orders / Instructions Wound #1 Right Lower Leg Milanese, Jaylan M. ([id number]) o Vitamin A; Vitamin C, Zinc - Please add a multivitamin that has 100% daily value of vitamin A, vitamin C and zinc supplements o Increase protein intake. Electronic Signature(s) Signed: 11/22/2017 4:47:18 PM By: [name] Signed: 11/24/2017 11:18:14 PM By: Worthy Keeler PA-C Entered By: [name] on 11/22/2017 11:54:29 Holway, Talbot M. ([id number]) -------------------------------------------------------------------------------- Problem List Details Patient Name: DONNEY, CARAVEO. Date of Service: 11/22/2017 11:00 AM Medical Record Number: [id number] Patient Account Number: [id number] Date of Birth/Sex: [birth year] (84 y.o. M) Treating RN: [name] Primary Care Provider: [name] Other Clinician: Referring Provider: [name] Treating Provider/Extender: Melburn Hake, Joane Postel Weeks in Treatment: 2 Active Problems ICD-10 Impacting Encounter Code Description Active Date Wound Healing Diagnosis L97.812 Non-pressure chronic ulcer of other part of right lower leg with 11/08/2017 No Yes fat layer exposed I48.0 Paroxysmal atrial fibrillation 11/08/2017 No Yes Z79.01 Long term (current) use of anticoagulants 11/08/2017 No Yes J44.9 Chronic obstructive pulmonary disease, unspecified 11/08/2017 No Yes I25.10 Atherosclerotic heart disease of native coronary artery 11/08/2017 No Yes without angina pectoris Inactive Problems Resolved Problems Electronic Signature(s) Signed: 11/24/2017 11:18:14 PM By: Worthy Keeler PA-C Entered By: Worthy Keeler on 11/22/2017  10:52:00 Helvey, Bayard M. ([id number]) -------------------------------------------------------------------------------- Progress Note Details Patient Name: Eduardo [name]. Date of Service: 11/22/2017 11:00 AM Medical Record Number: [id number] Patient Account Number: [id number] Date of Birth/Sex: 1930/06/14 (84 y.o. M) Treating RN: [name] Primary Care Provider: [name] Other Clinician: Referring Provider: [name] Treating Provider/Extender: Melburn Hake, Haylee Mcanany Weeks in Treatment: 2 Subjective Chief Complaint Information obtained from Patient Right lower leg ulcer due to trauma History of Present Illness (HPI) 11/08/17 on evaluation today patient presents initially concerning a right dramatic lower extremity ulcer/skin tear which occurred three weeks ago. He states that he initially was seen in urgent care where the area was sutured closed. The sutures stayed in  for two weeks he tells me. Subsequently he was seen at Dr. Alla German office where the sutures were removed and Steri-Strips were placed over the next five days. Subsequently these were also removed and unfortunately at this time it was noted that the patient had a dehisced larger wound that required further attention. The patient and his wife actually requested referral to the wound care center in Dr. [name] made the referral to [place]. Subsequently the patient does have a history of atrial fibrillation, cardiovascular disease, COPD, stroke, and is on long-term Eliquis. There's no history of diabetes that he states. Subsequently he has no other major medical problems currently. No fevers, chills, nausea, or vomiting noted at this time. 11/14/17-He is seen in follow-up evaluation for right posterior lower extremity ulcer. There is improvement with granulation tissue present; debridement. We will continue with same treatment plan he'll follow-up next week. The vascular office did call to establish an appointment, but  he forgot that we had referred him and did not make an appointment. He was encouraged to contact the office to schedule an appointment for evaluation. 11/22/17 on evaluation today patient actually appears to be doing well in regard to his ulcer. This has cleaned up very nicely in my pinion I do believe the Iodoflex has been of benefit for him. He actually does have his arterial study which is scheduled for 11/27/17. In general he's having no significant discomfort mainly just with cleansing of the wound but the dressings themselves don't seem to be causing him problems. Patient History Information obtained from Patient. Family History Cancer - Mother,Child, Heart Disease - Father, Lung Disease - Child, Stroke - Maternal Grandparents, No family history of Diabetes, Hereditary Spherocytosis, Hypertension, Kidney Disease, Seizures, Thyroid Problems, Tuberculosis. Social History Former smoker - 37 years, Marital Status - Married, Alcohol Use - Never, Caffeine Use - Daily. Review of Systems (ROS) Constitutional Symptoms (General Health) Denies complaints or symptoms of Fever, Chills. Respiratory The patient has no complaints or symptoms. Cardiovascular Complains or has symptoms of LE edema. Psychiatric The patient has no complaints or symptoms. Prichard, Jaleel M. ([id number]) Objective Constitutional Well-nourished and well-hydrated in no acute distress. Vitals Time Taken: 11:00 AM, Height: 62 in, Weight: 153.7 lbs, BMI: 28.1, Temperature: 97.6 F, Pulse: 71 bpm, Respiratory Rate: 18 breaths/min, Blood Pressure: 157/82 mmHg. Respiratory normal breathing without difficulty. clear to auscultation bilaterally. Cardiovascular regular rate and rhythm with normal S1, S2. Psychiatric this patient is able to make decisions and demonstrates good insight into disease process. Alert and Oriented x 3. pleasant and cooperative. General Notes: On inspection patient did have some Slough noted in the  surface of the wound bed fortunately this was not too significant which is great news. He's been tolerating the dressing changes in general without complication. Integumentary (Hair, Skin) Wound #1 status is Open. Original cause of wound was Trauma. The wound is located on the Right Lower Leg. The wound measures 3.5cm length x 2.1cm width x 0.3cm depth; 5.773cm^2 area and 1.732cm^3 volume. There is Fat Layer (Subcutaneous Tissue) Exposed exposed. There is no tunneling or undermining noted. There is a small amount of serous drainage noted. The wound margin is thickened. There is small (1-33%) pink granulation within the wound bed. There is a large (67-100%) amount of necrotic tissue within the wound bed including Eschar and Adherent Slough. The periwound skin appearance exhibited: Induration. The periwound skin appearance did not exhibit: Callus, Crepitus, Excoriation, Rash, Scarring, Dry/Scaly, Maceration, Atrophie Blanche, Cyanosis, Ecchymosis, Hemosiderin Staining, Mottled, Pallor, Rubor,  Erythema. Periwound temperature was noted as No Abnormality. The periwound has tenderness on palpation. Assessment Active Problems ICD-10 Non-pressure chronic ulcer of other part of right lower leg with fat layer exposed Paroxysmal atrial fibrillation Long term (current) use of anticoagulants Chronic obstructive pulmonary disease, unspecified Atherosclerotic heart disease of native coronary artery without angina pectoris Ebright, Diontre M. ([id number]) Plan Wound Cleansing: Wound #1 Right Lower Leg: Clean wound with Normal Saline. Anesthetic (add to Medication List): Wound #1 Right Lower Leg: Topical Lidocaine 4% cream applied to wound bed prior to debridement (In Clinic Only). Benzocaine Topical Anesthetic Spray applied to wound bed prior to debridement (In Clinic Only). Primary Wound Dressing: Wound #1 Right Lower Leg: Iodoflex Secondary Dressing: Wound #1 Right Lower Leg: Boardered Foam  Dressing Dressing Change Frequency: Wound #1 Right Lower Leg: Change Dressing Monday, Wednesday, Friday Follow-up Appointments: Wound #1 Right Lower Leg: Return Appointment in 1 week. Edema Control: Wound #1 Right Lower Leg: Other: - tubigrip Additional Orders / Instructions: Wound #1 Right Lower Leg: Vitamin A; Vitamin C, Zinc - Please add a multivitamin that has 100% daily value of vitamin A, vitamin C and zinc supplements Increase protein intake. Recommended at this point that we go ahead and initiate treatment with the Iodoflex as we have before since this seems to be doing so well. He's in agreement with plan. We will see were things stand at follow-up. Please see above for specific wound care orders. We will see patient for re-evaluation in 1 week(s) here in the clinic. If anything worsens or changes patient will contact our office for additional recommendations. Electronic Signature(s) Signed: 11/24/2017 11:18:14 PM By: Worthy Keeler PA-C Entered By: Worthy Keeler on 11/24/2017 23:15:33 Manner, Isak Jerilynn Mages ([id number]) -------------------------------------------------------------------------------- ROS/PFSH Details Patient Name: Eduardo [name]. Date of Service: 11/22/2017 11:00 AM Medical Record Number: [id number] Patient Account Number: [id number] Date of Birth/Sex: [birth year] (84 y.o. M) Treating RN: [name] Primary Care Provider: [name] Other Clinician: Referring Provider: [name] Treating Provider/Extender: Melburn Hake, Jannet Calip Weeks in Treatment: 2 Information Obtained From Patient Wound History Do you currently have one or more open woundso Yes How many open wounds do you currently haveo 1 Approximately how long have you had your woundso 3.5 weeks How have you been treating your wound(s) until nowo wet to dry Has your wound(s) ever healed and then re-openedo No Have you had any lab work done in the past montho Yes Have you tested  positive for an antibiotic resistant organism (MRSA, VRE)o No Have you tested positive for osteomyelitis (bone infection)o No Have you had any tests for circulation on your legso No Constitutional Symptoms (General Health) Complaints and Symptoms: Negative for: Fever; Chills Cardiovascular Complaints and Symptoms: Positive for: LE edema Medical History: Positive for: Arrhythmia - A-fib; Congestive Heart Failure; Coronary Artery Disease; Myocardial Infarction - 2013 Negative for: Angina; Deep Vein Thrombosis; Hypertension; Hypotension; Peripheral Arterial Disease; Peripheral Venous Disease; Phlebitis; Vasculitis Eyes Medical History: Positive for: Glaucoma - replaced Negative for: Cataracts; Optic Neuritis Ear/Nose/Mouth/Throat Medical History: Negative for: Chronic sinus problems/congestion; Middle ear problems Hematologic/Lymphatic Medical History: Negative for: Anemia; Hemophilia; Human Immunodeficiency Virus; Lymphedema; Sickle Cell Disease Respiratory Complaints and Symptoms: No Complaints or Symptoms Medical History: RHYATT, MUSKA. ([id number]) Positive for: Chronic Obstructive Pulmonary Disease (COPD) Negative for: Aspiration; Asthma; Pneumothorax; Sleep Apnea; Tuberculosis Gastrointestinal Medical History: Negative for: Cirrhosis ; Colitis; Crohnos; Hepatitis A; Hepatitis B; Hepatitis C Endocrine Medical History: Negative for: Type I Diabetes; Type II Diabetes Genitourinary Medical History: Negative  for: End Stage Renal Disease Immunological Medical History: Negative for: Lupus Erythematosus; Raynaudos; Scleroderma Integumentary (Skin) Medical History: Negative for: History of Burn; History of pressure wounds Musculoskeletal Medical History: Negative for: Gout; Rheumatoid Arthritis; Osteoarthritis; Osteomyelitis Neurologic Medical History: Positive for: Neuropathy - Feet and legs Negative for: Dementia; Quadriplegia; Paraplegia; Seizure  Disorder Oncologic Medical History: Negative for: Received Chemotherapy; Received Radiation Psychiatric Complaints and Symptoms: No Complaints or Symptoms Medical History: Negative for: Anorexia/bulimia; Confinement Anxiety HBO Extended History Items Eyes: Glaucoma Immunizations Pneumococcal Vaccine: Received Pneumococcal Vaccination: Yes TORREN, MAFFEO ([id number]) Implantable Devices Family and Social History Cancer: Yes - Mother,Child; Diabetes: No; Heart Disease: Yes - Father; Hereditary Spherocytosis: No; Hypertension: No; Kidney Disease: No; Lung Disease: Yes - Child; Seizures: No; Stroke: Yes - Maternal Grandparents; Thyroid Problems: No; Tuberculosis: No; Former smoker - 66 years; Marital Status - Married; Alcohol Use: Never; Caffeine Use: Daily; Advanced Directives: Yes (Not Provided); Patient does not want information on Advanced Directives; Living Will: Yes (Copy provided); Medical Power of Attorney: Yes (Not Provided) Physician Affirmation I have reviewed and agree with the above information. Electronic Signature(s) Signed: 11/24/2017 11:18:14 PM By: Worthy Keeler PA-C Signed: 11/25/2017 5:27:17 PM By: [name] Entered By: Worthy Keeler on 11/24/2017 23:15:02 Bordeau, Cire M. ([id number]) -------------------------------------------------------------------------------- SuperBill Details Patient Name: Eduardo [name]. Date of Service: 11/22/2017 Medical Record Number: [id number] Patient Account Number: [id number] Date of Birth/Sex: 05/07/1931 (84 y.o. M) Treating RN: [name] Primary Care Provider: [name] Other Clinician: Referring Provider: [name] Treating Provider/Extender: Melburn Hake, Milady Fleener Weeks in Treatment: 2 Diagnosis Coding ICD-10 Codes Code Description [phone] Non-pressure chronic ulcer of other part of right lower leg with fat layer exposed I48.0 Paroxysmal atrial fibrillation Z79.01 Long term (current) use of  anticoagulants J44.9 Chronic obstructive pulmonary disease, unspecified I25.10 Atherosclerotic heart disease of native coronary artery without angina pectoris Facility Procedures CPT4 Code: [id number] Description: 99213 - WOUND CARE VISIT-LEV 3 EST PT Modifier: Quantity: 1 Physician Procedures CPT4 Code Description: [id number] 19758 - WC PHYS LEVEL 3 - EST PT ICD-10 Diagnosis Description L97.812 Non-pressure chronic ulcer of other part of right lower leg w I48.0 Paroxysmal atrial fibrillation Z79.01 Long term (current) use of anticoagulants J44.9  Chronic obstructive pulmonary disease, unspecified Modifier: ith fat layer expo Quantity: 1 sed Electronic Signature(s) Signed: 11/24/2017 11:18:14 PM By: Worthy Keeler PA-C Entered By: Worthy Keeler on 11/24/2017 23:15:48

## 2017-11-27 NOTE — Progress Notes (Signed)
Eduardo [name] ([id number]) Visit Report for 11/22/2017 Arrival Information Details Patient Name: Eduardo [name], Eduardo [name]. Date of Service: 11/22/2017 11:00 AM Medical Record Number: [id number] Patient Account Number: [id number] Date of Birth/Sex: 09/02/1930 (84 y.o. M) Treating RN: Secundino Ginger Primary Care Dosha Broshears: [name] Other Clinician: Referring Tennelle Taflinger: [name] Treating Genene Kilman/Extender: Eduardo [name], Eduardo [name]: 2 Visit Information History Since Last Visit Added or deleted any medications: No Patient Arrived: Cane Any new allergies or adverse reactions: No Arrival Time: 10:57 Had a fall or experienced change in No Accompanied By: partner activities of daily living that may affect Transfer Assistance: None risk of falls: Patient Identification Verified: Yes Signs or symptoms of abuse/neglect since last visito No Secondary Verification Process Yes Hospitalized since last visit: No Completed: Implantable device outside of the clinic excluding No Patient Has Alerts: Yes cellular tissue based products placed in the center Patient Alerts: Patient on Blood since last visit: Thinner Has Dressing in Place as Prescribed: Yes Eliquis Pain Present Now: No 11/08/17 ABI >220 Biateral Electronic Signature(s) Signed: 11/22/2017 3:41:29 PM By: Secundino Ginger Entered By: Secundino Ginger on 11/22/2017 10:58:15 [name], [name] ([id number]) -------------------------------------------------------------------------------- Clinic Level of Care Assessment Details Patient Name: Eduardo [name]. Date of Service: 11/22/2017 11:00 AM Medical Record Number: [id number] Patient Account Number: [id number] Date of Birth/Sex: Apr 01, 1931 (84 y.o. M) Treating RN: [name] Primary Care Lynette Topete: [name] Other Clinician: Referring Ventura Leggitt: [name] Treating Simranjit Thayer/Extender: Eduardo [name], Eduardo [name]: 2 Clinic Level of Care Assessment Items TOOL  4 Quantity Score []  - Use when only an EandM is performed on FOLLOW-UP visit 0 ASSESSMENTS - Nursing Assessment / Reassessment X - Reassessment of Co-morbidities (includes updates in patient status) 1 10 X- 1 5 Reassessment of Adherence to [name] Plan ASSESSMENTS - Wound and Skin Assessment / Reassessment X - Simple Wound Assessment / Reassessment - one wound 1 5 []  - 0 Complex Wound Assessment / Reassessment - multiple wounds []  - 0 Dermatologic / Skin Assessment (not related to wound area) ASSESSMENTS - Focused Assessment X - Circumferential Edema Measurements - multi extremities 1 5 []  - 0 Nutritional Assessment / Counseling / Intervention X- 1 5 Lower Extremity Assessment (monofilament, tuning fork, pulses) []  - 0 Peripheral Arterial Disease Assessment (using hand held doppler) ASSESSMENTS - Ostomy and/or Continence Assessment and Care []  - Incontinence Assessment and Management 0 []  - 0 Ostomy Care Assessment and Management (repouching, etc.) PROCESS - Coordination of Care X - Simple Patient / Family Education for ongoing care 1 15 []  - 0 Complex (extensive) Patient / Family Education for ongoing care []  - 0 Staff obtains Programmer, systems, Records, Test Results / Process Orders []  - 0 Staff telephones HHA, Nursing Homes / Clarify orders / etc []  - 0 Routine Transfer to another Facility (non-emergent condition) []  - 0 Routine Hospital Admission (non-emergent condition) []  - 0 New Admissions / Biomedical engineer / Ordering NPWT, Apligraf, etc. []  - 0 Emergency Hospital Admission (emergent condition) X- 1 10 Simple Discharge Coordination [name], [name] ([id number]) []  - 0 Complex (extensive) Discharge Coordination PROCESS - Special Needs []  - Pediatric / Minor Patient Management 0 []  - 0 Isolation Patient Management []  - 0 Hearing / Language / Visual special needs []  - 0 Assessment of Community assistance (transportation, D/C planning, etc.) []  -  0 Additional assistance / Altered mentation []  - 0 Support Surface(s) Assessment (bed, cushion, seat, etc.) INTERVENTIONS - Wound Cleansing / Measurement X - Simple Wound Cleansing - one wound  1 5 []  - 0 Complex Wound Cleansing - multiple wounds X- 1 5 Wound Imaging (photographs - any number of wounds) []  - 0 Wound Tracing (instead of photographs) X- 1 5 Simple Wound Measurement - one wound []  - 0 Complex Wound Measurement - multiple wounds INTERVENTIONS - Wound Dressings X - Small Wound Dressing one or multiple wounds 1 10 []  - 0 Medium Wound Dressing one or multiple wounds []  - 0 Large Wound Dressing one or multiple wounds []  - 0 Application of Medications - topical []  - 0 Application of Medications - injection INTERVENTIONS - Miscellaneous []  - External ear exam 0 []  - 0 Specimen Collection (cultures, biopsies, blood, body fluids, etc.) []  - 0 Specimen(s) / Culture(s) sent or taken to Lab for analysis []  - 0 Patient Transfer (multiple staff / Civil Service fast streamer / Similar devices) []  - 0 Simple Staple / Suture removal (25 or less) []  - 0 Complex Staple / Suture removal (26 or more) []  - 0 Hypo / Hyperglycemic Management (close monitor of Blood Glucose) []  - 0 Ankle / Brachial Index (ABI) - do not check if billed separately X- 1 5 Vital Signs [name], [name] ([id number]) Has the patient been seen at the hospital within the last three years: Yes Total Score: 85 Level Of Care: New/Established - Level 3 Electronic Signature(s) Signed: 11/22/2017 4:47:18 PM By: [name] Entered By: [name] on 11/22/2017 11:54:57 [name], [name] ([id number]) -------------------------------------------------------------------------------- Encounter Discharge Information Details Patient Name: Eduardo [name]. Date of Service: 11/22/2017 11:00 AM Medical Record Number: [id number] Patient Account Number: [id number] Date of Birth/Sex: 23-Dec-1930 (84 y.o. M) Treating RN:  Eduardo [name] Primary Care Tyquisha Sharps: [name] Other Clinician: Referring Poppy Mcafee: [name] Treating Kaisyn Millea/Extender: Eduardo [name], Eduardo [name]: 2 Encounter Discharge Information Items Discharge Condition: Stable Ambulatory Status: Cane Discharge Destination: Home Transportation: Private Auto Accompanied By: wife Schedule Follow-up Appointment: Yes Clinical Summary of Care: Electronic Signature(s) Signed: 11/22/2017 1:09:45 PM By: Alric Quan Entered By: Alric Quan on 11/22/2017 13:09:45 Backlund, Mohsen M. ([id number]) -------------------------------------------------------------------------------- Lower Extremity Assessment Details Patient Name: Eduardo [name]. Date of Service: 11/22/2017 11:00 AM Medical Record Number: [id number] Patient Account Number: [id number] Date of Birth/Sex: June 07, 1931 (84 y.o. M) Treating RN: Secundino Ginger Primary Care Starlynn Klinkner: [name] Other Clinician: Referring Shia Delaine: [name] Treating Mayari Matus/Extender: Eduardo [name], Eduardo [name]: 2 Edema Assessment Assessed: [name]: No] [Right: No] [Left: Edema] [Right: :] Calf Left: Right: Point of Measurement: 30 cm From Medial Instep cm 33 cm Ankle Left: Right: Point of Measurement: 10 cm From Medial Instep cm 23 cm Vascular Assessment Claudication: Claudication Assessment [Right:None] Pulses: Posterior Tibial Extremity colors, hair growth, and conditions: Hair Growth on Extremity: [Right:Yes] Temperature of Extremity: [Right:Cool] Capillary Refill: [Right:< 3 seconds] Toe Nail Assessment Left: Right: Thick: No Discolored: No Deformed: No Improper Length and Hygiene: No Electronic Signature(s) Signed: 11/22/2017 3:41:29 PM By: Secundino Ginger Entered By: Secundino Ginger on 11/22/2017 11:18:51 Nickey, Shimshon M. ([id number]) -------------------------------------------------------------------------------- Multi Wound Chart Details Patient Name:  Eduardo [name]. Date of Service: 11/22/2017 11:00 AM Medical Record Number: [id number] Patient Account Number: [id number] Date of Birth/Sex: 09/21/1930 (84 y.o. M) Treating RN: [name] Primary Care Santita Hunsberger: [name] Other Clinician: Referring Iain Sawchuk: [name] Treating Avaiyah Strubel/Extender: Eduardo [name], Eduardo [name]: 2 Vital Signs Height(in): 62 Pulse(bpm): 71 Weight(lbs): 153.7 Blood Pressure(mmHg): 157/82 Body Mass Index(BMI): 28 Temperature(F): 97.6 Respiratory Rate 18 (breaths/min): Photos: [N/A:N/A] Wound Location: Right Lower Leg N/A N/A Wounding Event: Trauma N/A N/A  Primary Etiology: Trauma, Other N/A N/A Comorbid History: Glaucoma, Chronic N/A N/A Obstructive Pulmonary Disease (COPD), Arrhythmia, Congestive Heart Failure, Coronary Artery Disease, Myocardial Infarction, Neuropathy Date Acquired: 10/15/2017 N/A N/A Weeks of [name]: 2 N/A N/A Wound Status: Open N/A N/A Measurements L x W x D 3.5x2.1x0.3 N/A N/A (cm) Area (cm) : 5.773 N/A N/A Volume (cm) : 1.732 N/A N/A % Reduction in Area: -14.80% N/A N/A % Reduction in Volume: 13.90% N/A N/A Classification: Full Thickness Without N/A N/A Exposed Support Structures Exudate Amount: Small N/A N/A Exudate Type: Serous N/A N/A Exudate Color: amber N/A N/A Wound Margin: Thickened N/A N/A Granulation Amount: Small (1-33%) N/A N/A Granulation Quality: Pink N/A N/A Necrotic Amount: Large (67-100%) N/A N/A Necrotic Tissue: Eschar, Adherent Slough N/A N/A Mcgaughey, Jorel M. ([id number]) Exposed Structures: Fat Layer (Subcutaneous N/A N/A Tissue) Exposed: Yes Fascia: No Tendon: No Muscle: No Joint: No Bone: No Epithelialization: None N/A N/A Periwound Skin Texture: Induration: Yes N/A N/A Excoriation: No Callus: No Crepitus: No Rash: No Scarring: No Periwound Skin Moisture: Maceration: No N/A N/A Dry/Scaly: No Periwound Skin Color: Atrophie Blanche: No N/A  N/A Cyanosis: No Ecchymosis: No Erythema: No Hemosiderin Staining: No Mottled: No Pallor: No Rubor: No Temperature: No Abnormality N/A N/A Tenderness on Palpation: Yes N/A N/A Wound Preparation: Ulcer Cleansing: N/A N/A Rinsed/Irrigated with Saline Topical Anesthetic Applied: Other: lidocaine 4% [name] Notes Electronic Signature(s) Signed: 11/22/2017 4:47:18 PM By: [name] Entered By: [name] on 11/22/2017 11:53:33 Sethi, Nygel M. ([id number]) -------------------------------------------------------------------------------- Multi-Disciplinary Care Plan Details Patient Name: Eduardo [name]. Date of Service: 11/22/2017 11:00 AM Medical Record Number: [id number] Patient Account Number: [id number] Date of Birth/Sex: 04-Dec-1930 (84 y.o. M) Treating RN: [name] Primary Care Denice Cardon: [name] Other Clinician: Referring Priscilla Finklea: [name] Treating Marlina Cataldi/Extender: Eduardo [name], Eduardo [name]: 2 Active Inactive ` Abuse / Safety / Falls / Self Care Management Nursing Diagnoses: Potential for falls Goals: Patient will remain injury free related to falls Date Initiated: 11/08/2017 Target Resolution Date: 01/17/2018 Goal Status: Active Interventions: Assess fall risk on admission and as needed Notes: ` Orientation to the Wound Care Program Nursing Diagnoses: Knowledge deficit related to the wound healing center program Goals: Patient/caregiver will verbalize understanding of the [place] Program Date Initiated: 11/08/2017 Target Resolution Date: 01/17/2018 Goal Status: Active Interventions: Provide education on orientation to the wound center Notes: ` Wound/Skin Impairment Nursing Diagnoses: Impaired tissue integrity Goals: Ulcer/skin breakdown will heal within 14 weeks Date Initiated: 11/08/2017 Target Resolution Date: 01/17/2018 Goal Status: Active Interventions: NEVIN, GRIZZLE ([id number]) Assess  patient/caregiver ability to obtain necessary supplies Assess patient/caregiver ability to perform ulcer/skin care regimen upon admission and as needed Assess ulceration(s) every visit Notes: Electronic Signature(s) Signed: 11/22/2017 4:47:18 PM By: [name] Entered By: [name] on 11/22/2017 11:53:04 Cly, Amanuel M. ([id number]) -------------------------------------------------------------------------------- Pain Assessment Details Patient Name: Eduardo [name]. Date of Service: 11/22/2017 11:00 AM Medical Record Number: [id number] Patient Account Number: [id number] Date of Birth/Sex: [birth year] (84 y.o. M) Treating RN: Secundino Ginger Primary Care Dujuan Stankowski: [name] Other Clinician: Referring Nelli Swalley: [name] Treating Makai Agostinelli/Extender: Eduardo [name], Eduardo [name]: 2 Active Problems Location of Pain Severity and Description of Pain Patient Has Paino No Site Locations Pain Management and Medication Current Pain Management: Electronic Signature(s) Signed: 11/22/2017 3:41:29 PM By: Secundino Ginger Entered By: Secundino Ginger on 11/22/2017 10:58:43 Petropoulos, Gaege M. ([id number]) -------------------------------------------------------------------------------- Patient/Caregiver Education Details Patient Name: Eduardo [name]. Date of Service: 11/22/2017 11:00 AM Medical Record Number: [id number]  Patient Account Number: [id number] Date of Birth/Gender: 1930-11-30 (84 y.o. M) Treating RN: Eduardo [name] Primary Care Physician: [name] Other Clinician: Referring Physician: [name] Treating Physician/Extender: Sharalyn Ink in [name]: 2 Education Assessment Education Provided To: Patient Education Topics Provided Wound/Skin Impairment: Handouts: Caring for Your Ulcer, Skin Care Do's and Dont's, Other: change dressing as ordered Methods: Demonstration, Explain/Verbal Responses: State content correctly Electronic  Signature(s) Signed: 11/25/2017 5:21:58 PM By: Alric Quan Entered By: Alric Quan on 11/22/2017 13:10:02 Copperton, Ladera. ([id number]) -------------------------------------------------------------------------------- Wound Assessment Details Patient Name: Eduardo [name]. Date of Service: 11/22/2017 11:00 AM Medical Record Number: [id number] Patient Account Number: [id number] Date of Birth/Sex: 1930/09/14 (84 y.o. M) Treating RN: Secundino Ginger Primary Care Broughton Eppinger: [name] Other Clinician: Referring Raeford Brandenburg: [name] Treating Ilyas Lipsitz/Extender: Eduardo [name], Eduardo [name]: 2 Wound Status Wound Number: 1 Primary Trauma, Other Etiology: Wound Location: Right Lower Leg Wound Open Wounding Event: Trauma Status: Date Acquired: 10/15/2017 Comorbid Glaucoma, Chronic Obstructive Pulmonary Weeks Of [name]: 2 History: Disease (COPD), Arrhythmia, Congestive Heart Clustered Wound: No Failure, Coronary Artery Disease, Myocardial Infarction, Neuropathy Photos Photo Uploaded By: Secundino Ginger on 11/22/2017 11:22:28 Wound Measurements Length: (cm) 3.5 Width: (cm) 2.1 Depth: (cm) 0.3 Area: (cm) 5.773 Volume: (cm) 1.732 % Reduction in Area: -14.8% % Reduction in Volume: 13.9% Epithelialization: None Tunneling: No Undermining: No Wound Description Full Thickness Without Exposed Support Foul Odo Classification: Structures Slough/F Wound Margin: Thickened Exudate Small Amount: Exudate Type: Serous Exudate Color: amber r After Cleansing: No ibrino Yes Wound Bed Granulation Amount: Small (1-33%) Exposed Structure Granulation Quality: Pink Fascia Exposed: No Necrotic Amount: Large (67-100%) Fat Layer (Subcutaneous Tissue) Exposed: Yes Necrotic Quality: Eschar, Adherent Slough Tendon Exposed: No Muscle Exposed: No Joint Exposed: No Weigelt, Tavius M. ([id number]) Bone Exposed: No Periwound Skin Texture Texture Color No Abnormalities  Noted: No No Abnormalities Noted: No Callus: No Atrophie Blanche: No Crepitus: No Cyanosis: No Excoriation: No Ecchymosis: No Induration: Yes Erythema: No Rash: No Hemosiderin Staining: No Scarring: No Mottled: No Pallor: No Moisture Rubor: No No Abnormalities Noted: No Dry / Scaly: No Temperature / Pain Maceration: No Temperature: No Abnormality Tenderness on Palpation: Yes Wound Preparation Ulcer Cleansing: Rinsed/Irrigated with Saline Topical Anesthetic Applied: Other: lidocaine 4%, [name] Notes Wound #1 (Right Lower Leg) 1. Cleansed with: Clean wound with Normal Saline 2. Anesthetic Topical Lidocaine 4% cream to wound bed prior to debridement 4. Dressing Applied: Iodoflex 5. Secondary Dressing Applied Bordered Foam Dressing 7. Secured with Tubigrip Electronic Signature(s) Signed: 11/22/2017 3:41:29 PM By: Secundino Ginger Entered By: Secundino Ginger on 11/22/2017 11:13:22 Keizer, Laksh M. ([id number]) -------------------------------------------------------------------------------- [place] Details Patient Name: Eduardo [name]. Date of Service: 11/22/2017 11:00 AM Medical Record Number: [id number] Patient Account Number: [id number] Date of Birth/Sex: 1931-02-27 (84 y.o. M) Treating RN: Secundino Ginger Primary Care Aviv Lengacher: [name] Other Clinician: Referring Lysander Calixte: [name] Treating Milagro Belmares/Extender: Eduardo [name], Eduardo [name]: 2 Vital Signs Time Taken: 11:00 Temperature (F): 97.6 Height (in): 62 Pulse (bpm): 71 Weight (lbs): 153.7 Respiratory Rate (breaths/min): 18 Body Mass Index (BMI): 28.1 Blood Pressure (mmHg): 157/82 Reference Range: 80 - 120 mg / dl Electronic Signature(s) Signed: 11/22/2017 3:41:29 PM By: Secundino Ginger Entered By: Secundino Ginger on 11/22/2017 11:03:09

## 2017-11-28 ENCOUNTER — Encounter: Payer: Self-pay | Admitting: Interventional Cardiology

## 2017-11-29 ENCOUNTER — Telehealth: Payer: Self-pay | Admitting: *Deleted

## 2017-11-29 ENCOUNTER — Encounter: Payer: PPO | Admitting: Physician Assistant

## 2017-11-29 DIAGNOSIS — S81801A Unspecified open wound, right lower leg, initial encounter: Secondary | ICD-10-CM | POA: Diagnosis not present

## 2017-11-29 DIAGNOSIS — L97812 Non-pressure chronic ulcer of other part of right lower leg with fat layer exposed: Secondary | ICD-10-CM | POA: Diagnosis not present

## 2017-11-29 NOTE — Telephone Encounter (Signed)
Happy to have him if he would like to be seen in [place]

## 2017-11-29 NOTE — Telephone Encounter (Addendum)
Eduardo [name]  to Belva Crome, MD       11/28/17 11:32 AM  Dr. Tamala Julian,  I have seen Dr. Ida Rogue once before and felt we could have a good professional relationship, as you and I have had for many years. I have been leaning toward seeing physicans in [place] as it is more convenient to my home, and would like to schedule future yearly check-ups etc. with Dr. Rockey Situ and retain him as my cardiologist going forward. I would appreciate being released into his care.  Thank you for all you have done for me.  Sincerely,   Eduardo [name]     Will route to Dr. Tamala Julian and Dr. Rockey Situ for approval of pt switching physicians.

## 2017-12-02 ENCOUNTER — Encounter: Payer: Self-pay | Admitting: Internal Medicine

## 2017-12-02 DIAGNOSIS — Z0279 Encounter for issue of other medical certificate: Secondary | ICD-10-CM

## 2017-12-02 NOTE — Telephone Encounter (Signed)
PPW placed in RX tower.

## 2017-12-02 NOTE — Telephone Encounter (Signed)
Seems to be a great idea.  We will release his cardiology care to Dr. Rockey Situ.

## 2017-12-02 NOTE — Telephone Encounter (Signed)
Will route to scheduling dept to contact pt to schedule with Dr. Rockey Situ.

## 2017-12-03 ENCOUNTER — Ambulatory Visit (INDEPENDENT_AMBULATORY_CARE_PROVIDER_SITE_OTHER): Payer: PPO | Admitting: Vascular Surgery

## 2017-12-03 ENCOUNTER — Encounter: Payer: Self-pay | Admitting: Internal Medicine

## 2017-12-03 NOTE — Progress Notes (Signed)
Eduardo [name] ([id number]) Visit Report for 11/29/2017 Chief Complaint Document Details Patient Name: Eduardo [name], Eduardo [name]. Date of Service: 11/29/2017 3:15 PM Medical Record Number: [id number] Patient Account Number: [id number] Date of Birth/Sex: 07-Aug-1930 (84 y.o. M) Treating RN: [name] Primary Care Provider: [name] Other Clinician: Referring Provider: [name] Treating Provider/Extender: Melburn Hake, Doral Ventrella Weeks in Treatment: 3 Information Obtained from: Patient Chief Complaint Right lower leg ulcer due to trauma Electronic Signature(s) Signed: 12/02/2017 12:55:36 AM By: Worthy Keeler PA-C Entered By: Worthy Keeler on 11/29/2017 15:18:51 Coach, Johnattan M. ([id number]) -------------------------------------------------------------------------------- Debridement Details Patient Name: Eduardo [name]. Date of Service: 11/29/2017 3:15 PM Medical Record Number: [id number] Patient Account Number: [id number] Date of Birth/Sex: 30-May-1931 (84 y.o. M) Treating RN: [name] Primary Care Provider: [name] Other Clinician: Referring Provider: [name] Treating Provider/Extender: Melburn Hake, Zayneb Baucum Weeks in Treatment: 3 Debridement Performed for Wound #1 Right Lower Leg Assessment: Performed By: Physician STONE III, Wilna Pennie E., PA-C Debridement Type: Debridement Pre-procedure Verification/Time Yes - 15:46 Out Taken: Start Time: 15:46 Pain Control: Lidocaine 4% Topical Solution Total Area Debrided (L x W): 3.2 (cm) x 2 (cm) = 6.4 (cm) Tissue and other material Viable, Non-Viable, Slough, Subcutaneous, Slough debrided: Level: Skin/Subcutaneous Tissue Debridement Description: Excisional Instrument: Curette Bleeding: Minimum Hemostasis Achieved: Pressure End Time: 15:49 Procedural Pain: 0 Post Procedural Pain: 0 Response to Treatment: Procedure was tolerated well Level of Consciousness: Awake and Alert Post Procedure  Vitals: Temperature: 98.4 Pulse: 78 Respiratory Rate: 16 Blood Pressure: Systolic Blood Pressure: 220 Diastolic Blood Pressure: 62 Post Debridement Measurements of Total Wound Length: (cm) 3.2 Width: (cm) 2 Depth: (cm) 0.4 Volume: (cm) 2.011 Character of Wound/Ulcer Post Debridement: Improved Post Procedure Diagnosis Same as Pre-procedure Electronic Signature(s) Signed: 11/29/2017 4:49:47 PM By: [name] Signed: 12/02/2017 12:55:36 AM By: Worthy Keeler PA-C Entered By: [name] on 11/29/2017 15:48:36 Hays, Randy M. ([id number]) -------------------------------------------------------------------------------- HPI Details Patient Name: Eduardo [name]. Date of Service: 11/29/2017 3:15 PM Medical Record Number: [id number] Patient Account Number: [id number] Date of Birth/Sex: 1931-01-27 (84 y.o. M) Treating RN: [name] Primary Care Provider: [name] Other Clinician: Referring Provider: [name] Treating Provider/Extender: Melburn Hake, Mellonie Guess Weeks in Treatment: 3 History of Present Illness HPI Description: 11/08/17 on evaluation today patient presents initially concerning a right dramatic lower extremity ulcer/skin tear which occurred three weeks ago. He states that he initially was seen in urgent care where the area was sutured closed. The sutures stayed in for two weeks he tells me. Subsequently he was seen at Dr. Alla German office where the sutures were removed and Steri-Strips were placed over the next five days. Subsequently these were also removed and unfortunately at this time it was noted that the patient had a dehisced larger wound that required further attention. The patient and his wife actually requested referral to the wound care center in [place]. [name] made the referral to [place]. Subsequently the patient does have a history of atrial fibrillation, cardiovascular disease, COPD, stroke, and is on long-term Eliquis. There's no history of  diabetes that he states. Subsequently he has no other major medical problems currently. No fevers, chills, nausea, or vomiting noted at this time. 11/14/17-He is seen in follow-up evaluation for right posterior lower extremity ulcer. There is improvement with granulation tissue present; debridement. We will continue with same treatment plan he'll follow-up next week. The vascular office did call to establish an appointment, but he forgot that we had referred him and did not make an  appointment. He was encouraged to contact the office to schedule an appointment for evaluation. 11/22/17 on evaluation today patient actually appears to be doing well in regard to his ulcer. This has cleaned up very nicely in my pinion I do believe the Iodoflex has been of benefit for him. He actually does have his arterial study which is scheduled for 11/27/17. In general he's having no significant discomfort mainly just with cleansing of the wound but the dressings themselves don't seem to be causing him problems. 11/29/17 on evaluation today patient appears to be doing very well in regard to his lower extremity ulcer. With that being said he unfortunately prior to coming in for evaluation today did have a trip over a world where he failed hurting his back and striking his head. With that being said he has not had any altered mental status since that time and states that he's not really any more dizzy and there does not appear to be any residual effect from this. He also does not have a significant headache. With that being said this is something I did check out for him today briefly at least since he was in our office. I do think that he needs to look out for any signs or symptoms of anything worsening such as nausea, vomiting, fever, chills, confusion, or severe headaches. He has no visual disturbance at this point. Electronic Signature(s) Signed: 12/02/2017 12:55:36 AM By: Worthy Keeler PA-C Entered By: Worthy Keeler on 11/29/2017 17:12:52 Tayag, Quint M. ([id number]) -------------------------------------------------------------------------------- Physical Exam Details Patient Name: Eduardo [name]. Date of Service: 11/29/2017 3:15 PM Medical Record Number: [id number] Patient Account Number: [id number] Date of Birth/Sex: 04/14/1931 (84 y.o. M) Treating RN: [name] Primary Care Provider: [name] Other Clinician: Referring Provider: [name] Treating Provider/Extender: Melburn Hake, Odai Wimmer Weeks in Treatment: 3 Constitutional Well-nourished and well-hydrated in no acute distress. Respiratory normal breathing without difficulty. clear to auscultation bilaterally. Cardiovascular regular rate and rhythm with normal S1, S2. Psychiatric this patient is able to make decisions and demonstrates good insight into disease process. Alert and Oriented x 3. pleasant and cooperative. Notes On inspection today patient's wound does have some Slough noted on the surface which did require sharp debridement but in general he seems to be showing signs of good granulation and I'm very pleased with the progress that has been made in just a very short amount of time. Electronic Signature(s) Signed: 12/02/2017 12:55:36 AM By: Worthy Keeler PA-C Entered By: Worthy Keeler on 11/29/2017 17:14:05 Achey, Amori M. ([id number]) -------------------------------------------------------------------------------- Physician Orders Details Patient Name: Eduardo [name]. Date of Service: 11/29/2017 3:15 PM Medical Record Number: [id number] Patient Account Number: [id number] Date of Birth/Sex: 22-Jun-1930 (84 y.o. M) Treating RN: [name] Primary Care Provider: [name] Other Clinician: Referring Provider: [name] Treating Provider/Extender: Melburn Hake, Joshlyn Beadle Weeks in Treatment: 3 Verbal / Phone Orders: No Diagnosis Coding ICD-10 Coding Code Description [phone]  Non-pressure chronic ulcer of other part of right lower leg with fat layer exposed I48.0 Paroxysmal atrial fibrillation Z79.01 Long term (current) use of anticoagulants J44.9 Chronic obstructive pulmonary disease, unspecified I25.10 Atherosclerotic heart disease of native coronary artery without angina pectoris Wound Cleansing Wound #1 Right Lower Leg o Clean wound with Normal Saline. Anesthetic (add to Medication List) Wound #1 Right Lower Leg o Topical Lidocaine 4% cream applied to wound bed prior to debridement (In Clinic Only). o Benzocaine Topical Anesthetic Spray applied to wound bed prior to debridement (In Clinic Only). Primary  Wound Dressing Wound #1 Right Lower Leg o Silver Collagen Secondary Dressing Wound #1 Right Lower Leg o Boardered Foam Dressing Dressing Change Frequency Wound #1 Right Lower Leg o Change Dressing Monday, Wednesday, Friday Follow-up Appointments Wound #1 Right Lower Leg o Return Appointment in 1 week. Edema Control Wound #1 Right Lower Leg o Other: - tubigrip Additional Orders / Instructions Wound #1 Right Lower Leg Bisch, Kenzel M. ([id number]) o Vitamin A; Vitamin C, Zinc - Please add a multivitamin that has 100% daily value of vitamin A, vitamin C and zinc supplements o Increase protein intake. Electronic Signature(s) Signed: 11/29/2017 4:49:47 PM By: [name] Signed: 12/02/2017 12:55:36 AM By: Worthy Keeler PA-C Entered By: [name] on 11/29/2017 15:49:37 Moudy, Dannon M. ([id number]) -------------------------------------------------------------------------------- Problem List Details Patient Name: MARCELLUS, PULLIAM. Date of Service: 11/29/2017 3:15 PM Medical Record Number: [id number] Patient Account Number: [id number] Date of Birth/Sex: 1931/02/02 (84 y.o. M) Treating RN: [name] Primary Care Provider: [name] Other Clinician: Referring Provider: [name] Treating  Provider/Extender: Melburn Hake, Ermine Spofford Weeks in Treatment: 3 Active Problems ICD-10 Evaluated Encounter Code Description Active Date Today Diagnosis [phone] Non-pressure chronic ulcer of other part of right lower leg 11/08/2017 No Yes with fat layer exposed I48.0 Paroxysmal atrial fibrillation 11/08/2017 No Yes Z79.01 Long term (current) use of anticoagulants 11/08/2017 No Yes J44.9 Chronic obstructive pulmonary disease, unspecified 11/08/2017 No Yes I25.10 Atherosclerotic heart disease of native coronary artery 11/08/2017 No Yes without angina pectoris Inactive Problems Resolved Problems Electronic Signature(s) Signed: 12/02/2017 12:55:36 AM By: Worthy Keeler PA-C Entered By: Worthy Keeler on 11/29/2017 15:18:34 Pankow, Josejuan M. ([id number]) -------------------------------------------------------------------------------- Progress Note Details Patient Name: Eduardo [name]. Date of Service: 11/29/2017 3:15 PM Medical Record Number: [id number] Patient Account Number: [id number] Date of Birth/Sex: 1930/10/01 (84 y.o. M) Treating RN: [name] Primary Care Provider: [name] Other Clinician: Referring Provider: [name] Treating Provider/Extender: Melburn Hake, Janett Kamath Weeks in Treatment: 3 Subjective Chief Complaint Information obtained from Patient Right lower leg ulcer due to trauma History of Present Illness (HPI) 11/08/17 on evaluation today patient presents initially concerning a right dramatic lower extremity ulcer/skin tear which occurred three weeks ago. He states that he initially was seen in urgent care where the area was sutured closed. The sutures stayed in for two weeks he tells me. Subsequently he was seen at Dr. Alla German office where the sutures were removed and Steri-Strips were placed over the next five days. Subsequently these were also removed and unfortunately at this time it was noted that the patient had a dehisced larger wound that required  further attention. The patient and his wife actually requested referral to the wound care center in Dr. [name] made the referral to [place]. Subsequently the patient does have a history of atrial fibrillation, cardiovascular disease, COPD, stroke, and is on long-term Eliquis. There's no history of diabetes that he states. Subsequently he has no other major medical problems currently. No fevers, chills, nausea, or vomiting noted at this time. 11/14/17-He is seen in follow-up evaluation for right posterior lower extremity ulcer. There is improvement with granulation tissue present; debridement. We will continue with same treatment plan he'll follow-up next week. The vascular office did call to establish an appointment, but he forgot that we had referred him and did not make an appointment. He was encouraged to contact the office to schedule an appointment for evaluation. 11/22/17 on evaluation today patient actually appears to be doing well in regard to his ulcer. This has cleaned  up very nicely in my pinion I do believe the Iodoflex has been of benefit for him. He actually does have his arterial study which is scheduled for 11/27/17. In general he's having no significant discomfort mainly just with cleansing of the wound but the dressings themselves don't seem to be causing him problems. 11/29/17 on evaluation today patient appears to be doing very well in regard to his lower extremity ulcer. With that being said he unfortunately prior to coming in for evaluation today did have a trip over a world where he failed hurting his back and striking his head. With that being said he has not had any altered mental status since that time and states that he's not really any more dizzy and there does not appear to be any residual effect from this. He also does not have a significant headache. With that being said this is something I did check out for him today briefly at least since he was in our office. I do think that  he needs to look out for any signs or symptoms of anything worsening such as nausea, vomiting, fever, chills, confusion, or severe headaches. He has no visual disturbance at this point. Patient History Information obtained from Patient. Family History Cancer - Mother,Child, Heart Disease - Father, Lung Disease - Child, Stroke - Maternal Grandparents, No family history of Diabetes, Hereditary Spherocytosis, Hypertension, Kidney Disease, Seizures, Thyroid Problems, Tuberculosis. Social History Former smoker - 69 years, Marital Status - Married, Alcohol Use - Never, Caffeine Use - Daily. Review of Systems (ROS) Constitutional Symptoms [facility]) Murley, Jihaad M. ([id number]) Denies complaints or symptoms of Fever, Chills. Respiratory The patient has no complaints or symptoms. Cardiovascular The patient has no complaints or symptoms. Psychiatric The patient has no complaints or symptoms. Objective Constitutional Well-nourished and well-hydrated in no acute distress. Vitals Time Taken: 3:28 PM, Height: 62 in, Weight: 153.7 lbs, BMI: 28.1, Temperature: 98.4 F, Pulse: 78 bpm, Respiratory Rate: 16 breaths/min, Blood Pressure: 148/62 mmHg. Respiratory normal breathing without difficulty. clear to auscultation bilaterally. Cardiovascular regular rate and rhythm with normal S1, S2. Psychiatric this patient is able to make decisions and demonstrates good insight into disease process. Alert and Oriented x 3. pleasant and cooperative. General Notes: On inspection today patient's wound does have some Slough noted on the surface which did require sharp debridement but in general he seems to be showing signs of good granulation and I'm very pleased with the progress that has been made in just a very short amount of time. Integumentary (Hair, Skin) Wound #1 status is Open. Original cause of wound was Trauma. The wound is located on the Right Lower Leg. The wound measures 3.2cm length x  2cm width x 0.3cm depth; 5.027cm^2 area and 1.508cm^3 volume. There is Fat Layer (Subcutaneous Tissue) Exposed exposed. There is no tunneling or undermining noted. There is a small amount of serous drainage noted. The wound margin is thickened. There is small (1-33%) pink granulation within the wound bed. There is a large (67-100%) amount of necrotic tissue within the wound bed including Eschar and Adherent Slough. The periwound skin appearance exhibited: Induration, Erythema. The periwound skin appearance did not exhibit: Callus, Crepitus, Excoriation, Rash, Scarring, Dry/Scaly, Maceration, Atrophie Blanche, Cyanosis, Ecchymosis, Hemosiderin Staining, Mottled, Pallor, Rubor. The surrounding wound skin color is noted with erythema which is circumferential. Periwound temperature was noted as No Abnormality. The periwound has tenderness on palpation. Assessment Active Problems Theurer, Ruari M. ([id number]) ICD-10 Non-pressure chronic ulcer of other part of right  lower leg with fat layer exposed Paroxysmal atrial fibrillation Long term (current) use of anticoagulants Chronic obstructive pulmonary disease, unspecified Atherosclerotic heart disease of native coronary artery without angina pectoris Procedures Wound #1 Pre-procedure diagnosis of Wound #1 is a Trauma, Other located on the Right Lower Leg . There was a Excisional Skin/Subcutaneous Tissue Debridement with a total area of 6.4 sq cm performed by STONE III, Jansen Goodpasture E., PA-C. With the following instrument(s): Curette to remove Viable and Non-Viable tissue/material. Material removed includes Subcutaneous Tissue and Slough and after achieving pain control using Lidocaine 4% Topical Solution. No specimens were taken. A time out was conducted at 15:46, prior to the start of the procedure. A Minimum amount of bleeding was controlled with Pressure. The procedure was tolerated well with a pain level of 0 throughout and a pain level of 0 following  the procedure. Patient s Level of Consciousness post procedure was recorded as Awake and Alert. Post Debridement Measurements: 3.2cm length x 2cm width x 0.4cm depth; 2.011cm^3 volume. Character of Wound/Ulcer Post Debridement is improved. Post procedure Diagnosis Wound #1: Same as Pre-Procedure Plan Wound Cleansing: Wound #1 Right Lower Leg: Clean wound with Normal Saline. Anesthetic (add to Medication List): Wound #1 Right Lower Leg: Topical Lidocaine 4% cream applied to wound bed prior to debridement (In Clinic Only). Benzocaine Topical Anesthetic Spray applied to wound bed prior to debridement (In Clinic Only). Primary Wound Dressing: Wound #1 Right Lower Leg: Silver Collagen Secondary Dressing: Wound #1 Right Lower Leg: Boardered Foam Dressing Dressing Change Frequency: Wound #1 Right Lower Leg: Change Dressing Monday, Wednesday, Friday Follow-up Appointments: Wound #1 Right Lower Leg: Return Appointment in 1 week. Edema Control: Wound #1 Right Lower Leg: Other: - tubigrip Additional Orders / Instructions: Wound #1 Right Lower Leg: Vitamin A; Vitamin C, Zinc - Please add a multivitamin that has 100% daily value of vitamin A, vitamin C and zinc Nicoll, Zylon M. ([id number]) supplements Increase protein intake. I am gonna suggest that we continue with the above wound care orders for the next week. Patient is in agreement with the plan. We will see him were things stand at follow-up. Please see above for specific wound care orders. We will see patient for re-evaluation in 1 week(s) here in the clinic. If anything worsens or changes patient will contact our office for additional recommendations. Electronic Signature(s) Signed: 12/02/2017 12:55:36 AM By: Worthy Keeler PA-C Entered By: Worthy Keeler on 11/29/2017 17:14:52 Leiva, Jaleal M. ([id number]) -------------------------------------------------------------------------------- ROS/PFSH Details Patient Name:  Eduardo [name]. Date of Service: 11/29/2017 3:15 PM Medical Record Number: [id number] Patient Account Number: [id number] Date of Birth/Sex: [birth year] (84 y.o. M) Treating RN: [name] Primary Care Provider: [name] Other Clinician: Referring Provider: [name] Treating Provider/Extender: Melburn Hake, Oluwadamilare Tobler Weeks in Treatment: 3 Information Obtained From Patient Wound History Do you currently have one or more open woundso Yes How many open wounds do you currently haveo 1 Approximately how long have you had your woundso 3.5 weeks How have you been treating your wound(s) until nowo wet to dry Has your wound(s) ever healed and then re-openedo No Have you had any lab work done in the past montho Yes Have you tested positive for an antibiotic resistant organism (MRSA, VRE)o No Have you tested positive for osteomyelitis (bone infection)o No Have you had any tests for circulation on your legso No Constitutional Symptoms (General Health) Complaints and Symptoms: Negative for: Fever; Chills Eyes Medical History: Positive for: Glaucoma - replaced Negative for: Cataracts;  Optic Neuritis Ear/Nose/Mouth/Throat Medical History: Negative for: Chronic sinus problems/congestion; Middle ear problems Hematologic/Lymphatic Medical History: Negative for: Anemia; Hemophilia; Human Immunodeficiency Virus; Lymphedema; Sickle Cell Disease Respiratory Complaints and Symptoms: No Complaints or Symptoms Medical History: Positive for: Chronic Obstructive Pulmonary Disease (COPD) Negative for: Aspiration; Asthma; Pneumothorax; Sleep Apnea; Tuberculosis Cardiovascular Complaints and Symptoms: No Complaints or Symptoms Medical History: Positive for: Arrhythmia - A-fib; Congestive Heart Failure; Coronary Artery Disease; Myocardial Infarction - [address], Emad M. ([id number]) Negative for: Angina; Deep Vein Thrombosis; Hypertension; Hypotension; Peripheral Arterial Disease;  Peripheral Venous Disease; Phlebitis; Vasculitis Gastrointestinal Medical History: Negative for: Cirrhosis ; Colitis; Crohnos; Hepatitis A; Hepatitis B; Hepatitis C Endocrine Medical History: Negative for: Type I Diabetes; Type II Diabetes Genitourinary Medical History: Negative for: End Stage Renal Disease Immunological Medical History: Negative for: Lupus Erythematosus; Raynaudos; Scleroderma Integumentary (Skin) Medical History: Negative for: History of Burn; History of pressure wounds Musculoskeletal Medical History: Negative for: Gout; Rheumatoid Arthritis; Osteoarthritis; Osteomyelitis Neurologic Medical History: Positive for: Neuropathy - Feet and legs Negative for: Dementia; Quadriplegia; Paraplegia; Seizure Disorder Oncologic Medical History: Negative for: Received Chemotherapy; Received Radiation Psychiatric Complaints and Symptoms: No Complaints or Symptoms Medical History: Negative for: Anorexia/bulimia; Confinement Anxiety HBO Extended History Items Eyes: Glaucoma Immunizations Pneumococcal Vaccine: Received Pneumococcal Vaccination: Yes CHINO, SARDO ([id number]) Implantable Devices Family and Social History Cancer: Yes - Mother,Child; Diabetes: No; Heart Disease: Yes - Father; Hereditary Spherocytosis: No; Hypertension: No; Kidney Disease: No; Lung Disease: Yes - Child; Seizures: No; Stroke: Yes - Maternal Grandparents; Thyroid Problems: No; Tuberculosis: No; Former smoker - 70 years; Marital Status - Married; Alcohol Use: Never; Caffeine Use: Daily; Advanced Directives: Yes (Not Provided); Patient does not want information on Advanced Directives; Living Will: Yes (Copy provided); Medical Power of Attorney: Yes (Not Provided) Physician Affirmation I have reviewed and agree with the above information. Electronic Signature(s) Signed: 12/02/2017 12:55:36 AM By: Worthy Keeler PA-C Signed: 12/02/2017 4:53:29 PM By: [name] Entered By: Worthy Keeler on 11/29/2017 17:13:47 Knoke, Tavone M. ([id number]) -------------------------------------------------------------------------------- SuperBill Details Patient Name: Eduardo [name]. Date of Service: 11/29/2017 Medical Record Number: [id number] Patient Account Number: [id number] Date of Birth/Sex: [birth year] (84 y.o. M) Treating RN: [name] Primary Care Provider: [name] Other Clinician: Referring Provider: [name] Treating Provider/Extender: Melburn Hake, Lam Mccubbins Weeks in Treatment: 3 Diagnosis Coding ICD-10 Codes Code Description [id number] Non-pressure chronic ulcer of other part of right lower leg with fat layer exposed I48.0 Paroxysmal atrial fibrillation Z79.01 Long term (current) use of anticoagulants J44.9 Chronic obstructive pulmonary disease, unspecified I25.10 Atherosclerotic heart disease of native coronary artery without angina pectoris Facility Procedures CPT4 Code Description: [id number] - DEB SUBQ TISSUE 20 SQ CM/< ICD-10 Diagnosis Description Q33.354 Non-pressure chronic ulcer of other part of right lower leg wi Modifier: th fat layer expo Quantity: 1 sed Physician Procedures CPT4 Code Description: 5625638 [id number] - WC PHYS SUBQ TISS 20 SQ CM ICD-10 Diagnosis Description L37.342 Non-pressure chronic ulcer of other part of right lower leg wi Modifier: th fat layer expo Quantity: 1 sed Electronic Signature(s) Signed: 12/02/2017 12:55:36 AM By: Worthy Keeler PA-C Entered By: Worthy Keeler on 11/29/2017 17:15:26

## 2017-12-04 ENCOUNTER — Encounter: Payer: Self-pay | Admitting: Internal Medicine

## 2017-12-04 ENCOUNTER — Ambulatory Visit (INDEPENDENT_AMBULATORY_CARE_PROVIDER_SITE_OTHER): Payer: PPO | Admitting: Internal Medicine

## 2017-12-04 ENCOUNTER — Encounter (INDEPENDENT_AMBULATORY_CARE_PROVIDER_SITE_OTHER): Payer: Self-pay | Admitting: Vascular Surgery

## 2017-12-04 ENCOUNTER — Ambulatory Visit (INDEPENDENT_AMBULATORY_CARE_PROVIDER_SITE_OTHER): Payer: PPO | Admitting: Vascular Surgery

## 2017-12-04 VITALS — BP 138/84 | HR 96 | Temp 98.2°F | Ht 62.0 in | Wt 150.8 lb

## 2017-12-04 VITALS — BP 122/73 | HR 68 | Resp 14 | Ht 60.0 in | Wt 151.0 lb

## 2017-12-04 DIAGNOSIS — I10 Essential (primary) hypertension: Secondary | ICD-10-CM

## 2017-12-04 DIAGNOSIS — I6523 Occlusion and stenosis of bilateral carotid arteries: Secondary | ICD-10-CM

## 2017-12-04 DIAGNOSIS — E785 Hyperlipidemia, unspecified: Secondary | ICD-10-CM | POA: Diagnosis not present

## 2017-12-04 DIAGNOSIS — S81811S Laceration without foreign body, right lower leg, sequela: Secondary | ICD-10-CM

## 2017-12-04 DIAGNOSIS — S335XXA Sprain of ligaments of lumbar spine, initial encounter: Secondary | ICD-10-CM | POA: Diagnosis not present

## 2017-12-04 MED ORDER — TIZANIDINE HCL 2 MG PO CAPS: 2.0000 mg | ORAL_CAPSULE | Freq: Three times a day (TID) | ORAL | 0 refills | Status: DC | PRN

## 2017-12-04 NOTE — Progress Notes (Signed)
Subjective:    Patient ID: Eduardo [name], male    DOB: 1930-08-28, 84 y.o.   MRN: [id number]  HPI Here with wife due to ongoing back pain since fall 5 days ago Was trying to pick up a heavy box Fell against wall and slid down--onto coccyx and hit head Had gone to wound center after---head was okay  Ongoing back pain--across the entire low back Can feel the pain on a hard chair--but more of an issue when trying to walk Pain doesn't really radiate--it spasms and "grabs" Really notices it trying to get into bed  Only able to take tylenol  Has tried ice and heat  Current Outpatient Medications on File Prior to Visit  Medication Sig Dispense Refill  . acetaminophen (TYLENOL) 650 MG CR tablet Take 1,300 mg by mouth 2 (two) times daily.    Marland Kitchen albuterol (PROAIR HFA) 108 (90 Base) MCG/ACT inhaler Inhale 2 puffs into the lungs every 6 (six) hours as needed for wheezing or shortness of breath. 18 g 0  . albuterol (PROVENTIL) (2.5 MG/3ML) 0.083% nebulizer solution use 1 vial in nebulizer every 4 hours as needed for wheezing or shortness of breath 270 mL 2  . atorvastatin (LIPITOR) 80 MG tablet TAKE ONE TABLET BY MOUTH ONE TIME DAILY  90 tablet 3  . azelastine (ASTELIN) 0.1 % nasal spray Place 2 sprays into both nostrils at bedtime. Use in each nostril as directed    . B Complex-C (SUPER B COMPLEX PO) Take 1 tablet by mouth at bedtime.     . cetirizine (ZYRTEC) 10 MG tablet Take 10 mg by mouth at bedtime.     . cholecalciferol (VITAMIN D) 1000 units tablet Take 1,000 Units by mouth daily.    Marland Kitchen ELIQUIS 5 MG TABS tablet TAKE 1 TABLET BY MOUTH 2 TIMES DAILY 180 tablet 1  . escitalopram (LEXAPRO) 20 MG tablet Take 20 mg by mouth at bedtime.     . fenofibrate 160 MG tablet TAKE 1 TABLET BY MOUTH AT BEDTIME. 90 tablet 3  . furosemide (LASIX) 20 MG tablet TAKE 1 TABLET BY MOUTH DAILY 90 tablet 3  . hydrALAZINE (APRESOLINE) 10 MG tablet Take 1 tablet (10 mg total) by mouth 3 (three) times daily.  270 tablet 3  . ipratropium (ATROVENT) 0.03 % nasal spray PLACE 2 SPRAYS INTO BOTH NOSTRILS AT BEDTIME. 90 mL 3  . IRON PO Take by mouth.    . lamoTRIgine (LAMICTAL) 150 MG tablet Take 150 mg by mouth at bedtime. Reported on 08/14/2015    . montelukast (SINGULAIR) 10 MG tablet Take 1 tablet (10 mg total) by mouth at bedtime. 30 tablet 11  . Multiple Vitamin (MULTIVITAMIN) tablet Take 1 tablet by mouth at bedtime.     . pantoprazole (PROTONIX) 40 MG tablet TAKE 1 TABLET BY MOUTH DAILY BEFORE BREAKFAST. 90 tablet 3  . Tiotropium Bromide-Olodaterol (STIOLTO RESPIMAT) 2.5-2.5 MCG/ACT AERS Inhale 2 puffs into the lungs daily. 12 Inhaler 3  . triamcinolone cream (KENALOG) 0.1 % Apply 1 application topically 2 (two) times daily as needed (for skin).     . Turmeric 500 MG TABS Take 2 tablets by mouth.    . verapamil (CALAN-SR) 240 MG CR tablet TAKE 1 TABLET BY MOUTH AT BEDTIME 90 tablet 1   No current facility-administered medications on file prior to visit.     Allergies  Allergen Reactions  . Penicillins Itching    Has patient had a PCN reaction causing immediate rash, facial/tongue/throat swelling,  SOB or lightheadedness with hypotension: Yes Has patient had a PCN reaction causing severe rash involving mucus membranes or skin necrosis: No Has patient had a PCN reaction that required hospitalization No Has patient had a PCN reaction occurring within the last 10 years: No If all of the above answers are "NO", then may proceed with Cephalosporin use.   . Sulfonamide Derivatives Other (See Comments)    "it affected my kidneys"  . Tape Other (See Comments)    Arms black and blue, tears skin.  Please use "paper" tape    Past Medical History:  Diagnosis Date  . Allergy   . Anemia 2012, 08/2015   chronic. Acute due to large hematoma 2013. 2017: due to gastric AVM bleeding.   . Anxiety   . Atrial fibrillation ([place]) 2012   a.  CHA2DS2VASc = 6-->eliquis;  b. 12/2015 Echo: EF 60-65%, no rwma, LVH,  mild AS/MS/MR, sev dil LA, mild TR, PASP 62mmHg.  Marland Kitchen CAD (coronary artery disease)    a. 06/2010 Cath: LM nl, LAD 50p/m, LCX 35m, RCA 50p/m -->catheter dissection but good flow, 70d, RPDA 50-70;  b. 06/2010 Relook cath due to bradycardia and inf ST elev: RCA dissection @ ostium extending into coronary cusp and prox RCA, Ao dissection-->less staining than previously-->Med Rx.  Marland Kitchen COPD (chronic obstructive pulmonary disease) ([place]) 2013   exertional dyspnea  . Gastric angiodysplasia with hemorrhage 08/2015  . Hx of colonic polyps 2001, 2011   adenomatous 2001. HP 2001, 2011.  Marland Kitchen Hyperlipidemia   . Myocardial infarction ([place]) 06/2010.  12/2011.   "twice; 1 day apart; during/after cath". NQMI in setting severe anemia 2013.   Marland Kitchen Neuropathy 2014   in feet, likely due to spinal stenosis, DDD, HNP  . Osteoarthritis 2002   spinal stenosis, spondylolisthesis, disc protrusion multilevel in lumbar spine  . Stroke ([place]) 2016  . Upper GI bleed 08/2015   EGD: 2 small gastric AVMs, 1 actively bleeding.  Both treated with APC ablation, clipping of the bleeder    Past Surgical History:  Procedure Laterality Date  . APPENDECTOMY    . CARDIAC CATHETERIZATION  2012   50% LAD and luminal irreg in RCA, 1/12  Post cath MI from damage  . CARPAL TUNNEL RELEASE     left  . CARPAL TUNNEL RELEASE  10/11/11   Dr Rush Barer  . CATARACT EXTRACTION W/ INTRAOCULAR LENS IMPLANT  2/13   Dr [name]  . COLONOSCOPY  2001, 02/2010   Dr Deatra Ina.   . ESOPHAGOGASTRODUODENOSCOPY N/A 08/15/2015   [name] MD; 2 small gastric AVMs, 1 actively bleeding.  Both treated with APC ablation, clipping of the bleeder  . FOOT FRACTURE SURGERY     right heel repair  . FRACTURE SURGERY    . KNEE ARTHROSCOPY     left, right knee 10/2009  . MASTOIDECTOMY     x3 on right  . NASAL SEPTUM SURGERY     nasal septal repair  . PENILE PROSTHESIS  REMOVAL    . PENILE PROSTHESIS IMPLANT     x 2--got infection after 2nd and had to remove    . PILONIDAL CYST / SINUS EXCISION    . SHOULDER ARTHROSCOPY     right  . TONSILLECTOMY AND ADENOIDECTOMY      Family History  Problem Relation Age of Onset  . Cancer Mother        ? leukemia  . Heart attack Father   . Stroke Maternal Grandmother   . Diabetes Neg  Hx   . Hypertension Neg Hx     Social History   Socioeconomic History  . Marital status: Married    Spouse name: Not on file  . Number of children: 4  . Years of education: some college  . Highest education level: Not on file  Occupational History  . Occupation: Retired Chief Strategy Officer the job trainin)  Social Needs  . Financial resource strain: Not on file  . Food insecurity:    Worry: Not on file    Inability: Not on file  . Transportation needs:    Medical: Not on file    Non-medical: Not on file  Tobacco Use  . Smoking status: Former Smoker    Packs/day: 2.50    Years: 30.00    Pack years: 75.00    Types: Cigarettes    Last attempt to quit: 06/11/1978    Years since quitting: 39.5  . Smokeless tobacco: Never Used  Substance and Sexual Activity  . Alcohol use: No    Comment: QUIT [id number] alcoholic  . Drug use: No  . Sexual activity: Not Currently  Lifestyle  . Physical activity:    Days per week: Not on file    Minutes per session: Not on file  . Stress: Not on file  Relationships  . Social connections:    Talks on phone: Not on file    Gets together: Not on file    Attends religious service: Not on file    Active member of club or organization: Not on file    Attends meetings of clubs or organizations: Not on file    Relationship status: Not on file  . Intimate partner violence:    Fear of current or ex partner: Not on file    Emotionally abused: Not on file    Physically abused: Not on file    Forced sexual activity: Not on file  Other Topics Concern  . Not on file  Social History Narrative   Grew up in [place], [place].  Lives in [place].   Married---2nd   2 children ([place],  [place]  and near [place])--2 stepchildren   Former Smoker--quit in 1980   Alcohol use-no. Quit in 1980--alcoholic      Has living will.   Has designated wife, then step daughter [name] have health care POA.   Would accept resuscitation attempts.   No feeding tube if cognitively unaware.   Review of Systems  Constipated--- no BM in 6 days Did start miralax but no effect yet---started yesterday bid No loss of bladder control     Objective:   Physical Exam  Constitutional: No distress.  Musculoskeletal:  Very painful getting out of chair No spine tenderness Tenderness bilaterally around L3 No sacral or hip tenderness Fair ROM both hips--some referred back pain with full internal hip rotation SLR basically negative--some mild pain with full flexion at hip  Neurological:  Gait is fairly normal with rollator once he is up No leg weakness           Assessment & Plan:

## 2017-12-04 NOTE — Assessment & Plan Note (Signed)
Nothing to suggest hip or pelvis fracture No apparent radiculopathy Reassured---not serious  Discussed heat, tylenol Will Rx tizanidine to relieve the muscle spasm

## 2017-12-04 NOTE — Progress Notes (Signed)
Subjective:    Patient ID: Eduardo [name], male    DOB: February 09, 1931, 84 y.o.   MRN: [id number] Chief Complaint  Patient presents with  . Follow-up    Ultrasound follow up/ Arterial   Patient presents to review vascular studies.  Patient was last seen on November 19, 2017 for evaluation of bilateral lower extremity arterial status.  The patient continues to be seen at the wound center for a wound he sustained to the right lower extremity due to trauma.  Patient seen with his wife.  Patient and his wife note the wound continues to heal.  The patient underwent a bilateral carotid duplex which was notable for moderate to large amount of bilateral atherosclerotic plaque, right greater than left, not resulting in a hemodynamically significant stenosis of either internal carotid artery. The patient denies experiencing Amaurosis Fugax, TIA like symptoms or focal motor deficits.  The patient underwent a bilateral ABI which were non-calculable due to medial calcification.  Triphasic waveforms were maintained throughout the bilateral lower extremities.  Patient continues to deny any claudication-like symptoms, rest pain or worsening/new ulceration to the bilateral lower extremity.  The patient denies any fever, nausea vomiting.  Review of Systems  Constitutional: Negative.   HENT: Negative.   Eyes: Negative.   Respiratory: Negative.   Cardiovascular: Negative.   Gastrointestinal: Negative.   Endocrine: Negative.   Genitourinary: Negative.   Musculoskeletal: Negative.   Skin: Positive for wound.  Allergic/Immunologic: Negative.   Neurological: Negative.   Hematological: Negative.   Psychiatric/Behavioral: Negative.       Objective:   Physical Exam  Constitutional: He is oriented to person, place, and time. He appears well-developed and well-nourished. No distress.  HENT:  Head: Normocephalic and atraumatic.  Right Ear: External ear normal.  Left Ear: External ear normal.  Eyes: Pupils are  equal, round, and reactive to light. Conjunctivae and EOM are normal.  Neck: Normal range of motion.  Cardiovascular: Normal rate, regular rhythm, normal heart sounds and intact distal pulses.  Pulses:      Radial pulses are 2+ on the right side, and 2+ on the left side.  Hard to palpate pedal pulses however the bilateral feet are warm  Pulmonary/Chest: Effort normal and breath sounds normal.  Musculoskeletal: He exhibits edema (Mild nonpitting edema noted to the right lower extremity).  Neurological: He is alert and oriented to person, place, and time.  Skin: He is not diaphoretic.  3 cm x 3 cm ulceration noted to the right calf.  No cellulitis, surrounding skin is healthy, no necrotic tissue,.  Psychiatric: He has a normal mood and affect. His behavior is normal. Judgment and thought content normal.  Vitals reviewed.  BP 122/73 (BP Location: Right Arm, Patient Position: Sitting)   Pulse 68   Resp 14   Ht 5' (1.524 m)   Wt 151 lb (68.5 kg)   BMI 29.49 kg/m   Past Medical History:  Diagnosis Date  . Allergy   . Anemia 2012, 08/2015   chronic. Acute due to large hematoma 2013. 2017: due to gastric AVM bleeding.   . Anxiety   . Atrial fibrillation ([place]) 2012   a.  CHA2DS2VASc = 6-->eliquis;  b. 12/2015 Echo: EF 60-65%, no rwma, LVH, mild AS/MS/MR, sev dil LA, mild TR, PASP 55mmHg.  Marland Kitchen CAD (coronary artery disease)    a. 06/2010 Cath: LM nl, LAD 50p/m, LCX 22m, RCA 50p/m -->catheter dissection but good flow, 70d, RPDA 50-70;  b. 06/2010 Relook cath due to  bradycardia and inf ST elev: RCA dissection @ ostium extending into coronary cusp and prox RCA, Ao dissection-->less staining than previously-->Med Rx.  Marland Kitchen COPD (chronic obstructive pulmonary disease) ([place]) 2013   exertional dyspnea  . Gastric angiodysplasia with hemorrhage 08/2015  . Hx of colonic polyps 2001, 2011   adenomatous 2001. HP 2001, 2011.  Marland Kitchen Hyperlipidemia   . Myocardial infarction ([place]) 06/2010.  12/2011.   "twice; 1 day  apart; during/after cath". NQMI in setting severe anemia 2013.   Marland Kitchen Neuropathy 2014   in feet, likely due to spinal stenosis, DDD, HNP  . Osteoarthritis 2002   spinal stenosis, spondylolisthesis, disc protrusion multilevel in lumbar spine  . Stroke ([place]) 2016  . Upper GI bleed 08/2015   EGD: 2 small gastric AVMs, 1 actively bleeding.  Both treated with APC ablation, clipping of the bleeder   Social History   Socioeconomic History  . Marital status: Married    Spouse name: Not on file  . Number of children: 4  . Years of education: some college  . Highest education level: Not on file  Occupational History  . Occupation: Retired Chief Strategy Officer the job trainin)  Social Needs  . Financial resource strain: Not on file  . Food insecurity:    Worry: Not on file    Inability: Not on file  . Transportation needs:    Medical: Not on file    Non-medical: Not on file  Tobacco Use  . Smoking status: Former Smoker    Packs/day: 2.50    Years: 30.00    Pack years: 75.00    Types: Cigarettes    Last attempt to quit: 06/11/1978    Years since quitting: 39.5  . Smokeless tobacco: Never Used  Substance and Sexual Activity  . Alcohol use: No    Comment: QUIT [id number] alcoholic  . Drug use: No  . Sexual activity: Not Currently  Lifestyle  . Physical activity:    Days per week: Not on file    Minutes per session: Not on file  . Stress: Not on file  Relationships  . Social connections:    Talks on phone: Not on file    Gets together: Not on file    Attends religious service: Not on file    Active member of club or organization: Not on file    Attends meetings of clubs or organizations: Not on file    Relationship status: Not on file  . Intimate partner violence:    Fear of current or ex partner: Not on file    Emotionally abused: Not on file    Physically abused: Not on file    Forced sexual activity: Not on file  Other Topics Concern  . Not on file  Social History Narrative    Grew up in [place], [place].  Lives in [place].   Married---2nd   2 children ([place], [place]  and near [place])--2 stepchildren   Former Smoker--quit in 1980   Alcohol use-no. Quit in 1980--alcoholic      Has living will.   Has designated wife, then step daughter [name] have health care POA.   Would accept resuscitation attempts.   No feeding tube if cognitively unaware.   Past Surgical History:  Procedure Laterality Date  . APPENDECTOMY    . CARDIAC CATHETERIZATION  2012   50% LAD and luminal irreg in RCA, 1/12  Post cath MI from damage  . CARPAL TUNNEL RELEASE     left  . CARPAL  TUNNEL RELEASE  10/11/11   Dr Rush Barer  . CATARACT EXTRACTION W/ INTRAOCULAR LENS IMPLANT  2/13   Dr [name]  . COLONOSCOPY  2001, 02/2010   Dr Deatra Ina.   . ESOPHAGOGASTRODUODENOSCOPY N/A 08/15/2015   [name] MD; 2 small gastric AVMs, 1 actively bleeding.  Both treated with APC ablation, clipping of the bleeder  . FOOT FRACTURE SURGERY     right heel repair  . FRACTURE SURGERY    . KNEE ARTHROSCOPY     left, right knee 10/2009  . MASTOIDECTOMY     x3 on right  . NASAL SEPTUM SURGERY     nasal septal repair  . PENILE PROSTHESIS  REMOVAL    . PENILE PROSTHESIS IMPLANT     x 2--got infection after 2nd and had to remove  . PILONIDAL CYST / SINUS EXCISION    . SHOULDER ARTHROSCOPY     right  . TONSILLECTOMY AND ADENOIDECTOMY     Family History  Problem Relation Age of Onset  . Cancer Mother        ? leukemia  . Heart attack Father   . Stroke Maternal Grandmother   . Diabetes Neg Hx   . Hypertension Neg Hx    Allergies  Allergen Reactions  . Penicillins Itching    Has patient had a PCN reaction causing immediate rash, facial/tongue/throat swelling, SOB or lightheadedness with hypotension: Yes Has patient had a PCN reaction causing severe rash involving mucus membranes or skin necrosis: No Has patient had a PCN reaction that required hospitalization No Has patient  had a PCN reaction occurring within the last 10 years: No If all of the above answers are "NO", then may proceed with Cephalosporin use.   . Sulfonamide Derivatives Other (See Comments)    "it affected my kidneys"  . Tape Other (See Comments)    Arms black and blue, tears skin.  Please use "paper" tape      Assessment & Plan:  Patient presents to review vascular studies.  Patient was last seen on November 19, 2017 for evaluation of bilateral lower extremity arterial status.  The patient continues to be seen at the wound center for a wound he sustained to the right lower extremity due to trauma.  Patient seen with his wife.  Patient and his wife note the wound continues to heal.  The patient underwent a bilateral carotid duplex which was notable for moderate to large amount of bilateral atherosclerotic plaque, right greater than left, not resulting in a hemodynamically significant stenosis of either internal carotid artery. The patient denies experiencing Amaurosis Fugax, TIA like symptoms or focal motor deficits.  The patient underwent a bilateral ABI which were non-calculable due to medial calcification.  Triphasic waveforms were maintained throughout the bilateral lower extremities.  Patient continues to deny any claudication-like symptoms, rest pain or worsening/new ulceration to the bilateral lower extremity.  The patient denies any fever, nausea vomiting.  1. Bilateral carotid artery stenosis - New Patient with moderate plaque noted on duplex however it does not result any hemodynamically significant stenosis of either internal carotid artery Patient is asymptomatic at this time Recommend a bilateral carotid duplex in 1 year Patient to remain abstinent of tobacco use. I have discussed with the patient at length the risk factors for and pathogenesis of atherosclerotic disease and encouraged a healthy diet, regular exercise regimen and blood pressure / glucose control.  Patient was instructed to  contact our office in the interim with problems such as arm /  leg weakness or numbness, speech / swallowing difficulty or temporary monocular blindness. The patient expresses their understanding.   - VAS US CAROTID; Future  2. Laceration of right lower extremity excluding thigh, sequela - Stable The patient underwent an ABI which was notable for triphasic waveforms bilaterally. Patient is being seen at the [facility] wound center for local wound care.  The patient and his wife note continued healing to the ulceration The patie nt has normal arterial blood flow no indication for endovascular/open intervention is indicated.  If the patient is interested in working up the edema to the right lower extremity we discussed he would need to undergo a venous duplex.  At this time the patient and his wife are not interested in moving forward however if they change their mind we will be happy to schedule them.  He should continue to elevate his right lower extremity as well as wear medical grade 1 compression socks on a daily basis.  3. Dyslipidemia - Stable Encouraged good control as its slows the progression of atherosclerotic disease  4. Essential hypertension - Stable Encouraged good control as its slows the progression of atherosclerotic disease  Current Outpatient Medications on File Prior to Visit  Medication Sig Dispense Refill  . acetaminophen (TYLENOL) 650 MG CR tablet Take 1,300 mg by mouth 2 (two) times daily.    Marland Kitchen albuterol (PROAIR HFA) 108 (90 Base) MCG/ACT inhaler Inhale 2 puffs into the lungs every 6 (six) hours as needed for wheezing or shortness of breath. 18 g 0  . albuterol (PROVENTIL) (2.5 MG/3ML) 0.083% nebulizer solution use 1 vial in nebulizer every 4 hours as needed for wheezing or shortness of breath 270 mL 2  . atorvastatin (LIPITOR) 80 MG tablet TAKE ONE TABLET BY MOUTH ONE TIME DAILY  90 tablet 3  . azelastine (ASTELIN) 0.1 % nasal spray Place 2 sprays  into both nostrils at bedtime. Use in each nostril as directed    . B Complex-C (SUPER B COMPLEX PO) Take 1 tablet by mouth at bedtime.     . cetirizine (ZYRTEC) 10 MG tablet Take 10 mg by mouth at bedtime.     . cholecalciferol (VITAMIN D) 1000 units tablet Take 1,000 Units by mouth daily.    Marland Kitchen ELIQUIS 5 MG TABS tablet TAKE 1 TABLET BY MOUTH 2 TIMES DAILY 180 tablet 1  . escitalopram (LEXAPRO) 20 MG tablet Take 20 mg by mouth at bedtime.     . fenofibrate 160 MG tablet TAKE 1 TABLET BY MOUTH AT BEDTIME. 90 tablet 3  . furosemide (LASIX) 20 MG tablet TAKE 1 TABLET BY MOUTH DAILY 90 tablet 3  . hydrALAZINE (APRESOLINE) 10 MG tablet Take 1 tablet (10 mg total) by mouth 3 (three) times daily. 270 tablet 3  . ipratropium (ATROVENT) 0.03 % nasal spray PLACE 2 SPRAYS INTO BOTH NOSTRILS AT BEDTIME. 90 mL 3  . IRON PO Take by mouth.    . lamoTRIgine (LAMICTAL) 150 MG tablet Take 150 mg by mouth at bedtime. Reported on 08/14/2015    . montelukast (SINGULAIR) 10 MG tablet Take 1 tablet (10 mg total) by mouth at bedtime. 30 tablet 11  . Multiple Vitamin (MULTIVITAMIN) tablet Take 1 tablet by mouth at bedtime.     . pantoprazole (PROTONIX) 40 MG tablet TAKE 1 TABLET BY MOUTH DAILY BEFORE BREAKFAST. 90 tablet 3  . Tiotropium Bromide-Olodaterol (STIOLTO RESPIMAT) 2.5-2.5 MCG/ACT AERS Inhale 2 puffs into the lungs daily. 12 Inhaler 3  . triamcinolone  cream (KENALOG) 0.1 % Apply 1 application topically 2 (two) times daily as needed (for skin).     . Turmeric 500 MG TABS Take 2 tablets by mouth.    . verapamil (CALAN-SR) 240 MG CR tablet TAKE 1 TABLET BY MOUTH AT BEDTIME 90 tablet 1   No current facility-administered medications on file prior to visit.    There are no Patient Instructions on file for this visit. No follow-ups on file.  KIMBERLY A STEGMAYER, PA-C

## 2017-12-05 NOTE — Progress Notes (Signed)
Eduardo, [name] ([id number]) Visit Report for 11/29/2017 Arrival Information Details Patient Name: Eduardo [name], Eduardo [name]. Date of Service: 11/29/2017 3:15 PM Medical Record Number: [id number] Patient Account Number: [id number] Date of Birth/Sex: 04-Feb-1931 (84 y.o. M) Treating RN: Secundino Ginger Primary Care Shanetha Bradham: [name] Other Clinician: Referring Gordon Vandunk: [name] Treating Merranda Bolls/Extender: Melburn Hake, HOYT Weeks in Treatment: 3 Visit Information History Since Last Visit Added or deleted any medications: No Patient Arrived: Walker Any new allergies or adverse reactions: No Arrival Time: 15:27 Had a fall or experienced change in No Accompanied By: wife activities of daily living that may affect Transfer Assistance: None risk of falls: Patient Identification Verified: Yes Signs or symptoms of abuse/neglect since last visito No Secondary Verification Process Yes Hospitalized since last visit: No Completed: Implantable device outside of the clinic excluding No Patient Has Alerts: Yes cellular tissue based products placed in the center Patient Alerts: Patient on Blood since last visit: Thinner Has Dressing in Place as Prescribed: Yes Eliquis Has Compression in Place as Prescribed: Yes 11/08/17 ABI >220 Biateral Pain Present Now: No Electronic Signature(s) Signed: 12/03/2017 11:20:16 AM By: Secundino Ginger Entered By: Secundino Ginger on 11/29/2017 15:28:02 Shadowens, Rucker M. ([id number]) -------------------------------------------------------------------------------- Encounter Discharge Information Details Patient Name: Eduardo [name]. Date of Service: 11/29/2017 3:15 PM Medical Record Number: [id number] Patient Account Number: [id number] Date of Birth/Sex: 09-24-1930 (84 y.o. M) Treating RN: Ahmed Prima Primary Care Velma Agnes: [name] Other Clinician: Referring Vincy Feliz: [name] Treating Savvy Peeters/Extender: Melburn Hake, HOYT Weeks in Treatment:  3 Encounter Discharge Information Items Discharge Condition: Stable Ambulatory Status: Walker Discharge Destination: Home Transportation: Private Auto Accompanied By: wife Schedule Follow-up Appointment: Yes Clinical Summary of Care: Electronic Signature(s) Signed: 12/04/2017 4:02:05 PM By: Alric Quan Entered By: Alric Quan on 11/29/2017 15:52:16 Tappan, Zailyn M. ([id number]) -------------------------------------------------------------------------------- Lower Extremity Assessment Details Patient Name: Eduardo [name]. Date of Service: 11/29/2017 3:15 PM Medical Record Number: [id number] Patient Account Number: [id number] Date of Birth/Sex: [birth year] (84 y.o. M) Treating RN: Secundino Ginger Primary Care Rigby Leonhardt: [name] Other Clinician: Referring Oseas Detty: [name] Treating Jeani Fassnacht/Extender: Melburn Hake, HOYT Weeks in Treatment: 3 Edema Assessment Assessed: [name]: No] [name]: No] [Left: Edema] [Right: :] Calf Left: Right: Point of Measurement: 30 cm From Medial Instep cm 31.5 cm Ankle Left: Right: Point of Measurement: 10 cm From Medial Instep cm 22.5 cm Vascular Assessment Claudication: Claudication Assessment [Right:None] Pulses: Dorsalis Pedis Palpable: [Right:Yes] Posterior Tibial Extremity colors, hair growth, and conditions: Extremity Color: [Right:Normal] Hair Growth on Extremity: [Right:No] Temperature of Extremity: [Right:Cool] Capillary Refill: [Right:< 3 seconds] Toe Nail Assessment Left: Right: Thick: No Discolored: No Deformed: No Improper Length and Hygiene: No Electronic Signature(s) Signed: 12/03/2017 11:20:16 AM By: Secundino Ginger Entered By: Secundino Ginger on 11/29/2017 15:37:50 Slawson, Diago M. ([id number]) -------------------------------------------------------------------------------- Multi Wound Chart Details Patient Name: Eduardo [name]. Date of Service: 11/29/2017 3:15 PM Medical Record Number:  [id number] Patient Account Number: [id number] Date of Birth/Sex: 04/14/1931 (84 y.o. M) Treating RN: [name] Primary Care Mida Cory: [name] Other Clinician: Referring Jilliam Bellmore: [name] Treating Halil Rentz/Extender: Melburn Hake, HOYT Weeks in Treatment: 3 Vital Signs Height(in): 62 Pulse(bpm): 20 Weight(lbs): 153.7 Blood Pressure(mmHg): 148/62 Body Mass Index(BMI): 28 Temperature(F): 98.4 Respiratory Rate 16 (breaths/min): Photos: [N/A:N/A] Wound Location: Right Lower Leg N/A N/A Wounding Event: Trauma N/A N/A Primary Etiology: Trauma, Other N/A N/A Comorbid History: Glaucoma, Chronic N/A N/A Obstructive Pulmonary Disease (COPD), Arrhythmia, Congestive Heart Failure, Coronary Artery Disease, Myocardial Infarction, Neuropathy Date Acquired: 10/15/2017 N/A N/A Weeks of Treatment: 3  N/A N/A Wound Status: Open N/A N/A Measurements L x W x D 3.2x2x0.3 N/A N/A (cm) Area (cm) : 5.027 N/A N/A Volume (cm) : 1.508 N/A N/A % Reduction in Area: 0.00% N/A N/A % Reduction in Volume: 25.00% N/A N/A Classification: Full Thickness Without N/A N/A Exposed Support Structures Exudate Amount: Small N/A N/A Exudate Type: Serous N/A N/A Exudate Color: amber N/A N/A Wound Margin: Thickened N/A N/A Granulation Amount: Small (1-33%) N/A N/A Granulation Quality: Pink N/A N/A Necrotic Amount: Large (67-100%) N/A N/A Necrotic Tissue: Eschar, Adherent Slough N/A N/A Taff, Demarion M. ([id number]) Exposed Structures: Fat Layer (Subcutaneous N/A N/A Tissue) Exposed: Yes Fascia: No Tendon: No Muscle: No Joint: No Bone: No Epithelialization: None N/A N/A Periwound Skin Texture: Induration: Yes N/A N/A Excoriation: No Callus: No Crepitus: No Rash: No Scarring: No Periwound Skin Moisture: Maceration: No N/A N/A Dry/Scaly: No Periwound Skin Color: Erythema: Yes N/A N/A Atrophie Blanche: No Cyanosis: No Ecchymosis: No Hemosiderin Staining: No Mottled:  No Pallor: No Rubor: No Erythema Location: Circumferential N/A N/A Temperature: No Abnormality N/A N/A Tenderness on Palpation: Yes N/A N/A Wound Preparation: Ulcer Cleansing: N/A N/A Rinsed/Irrigated with Saline Topical Anesthetic Applied: Other: lidocaine 4% Treatment Notes Electronic Signature(s) Signed: 11/29/2017 4:49:47 PM By: [name] Entered By: [name] on 11/29/2017 15:44:43 Lynn, Elbert M. ([id number]) -------------------------------------------------------------------------------- [place] Details Patient Name: Eduardo [name]. Date of Service: 11/29/2017 3:15 PM Medical Record Number: [id number] Patient Account Number: [id number] Date of Birth/Sex: 1930/07/20 (84 y.o. M) Treating RN: [name] Primary Care Shyah Cadmus: [name] Other Clinician: Referring Maikel Neisler: [name] Treating Terryn Rosenkranz/Extender: Melburn Hake, HOYT Weeks in Treatment: 3 Active Inactive ` Abuse / Safety / Falls / Self Care Management Nursing Diagnoses: Potential for falls Goals: Patient will remain injury free related to falls Date Initiated: 11/08/2017 Target Resolution Date: 01/17/2018 Goal Status: Active Interventions: Assess fall risk on admission and as needed Notes: ` Orientation to the Wound Care Program Nursing Diagnoses: Knowledge deficit related to the wound healing center program Goals: Patient/caregiver will verbalize understanding of the [facility] Date Initiated: 11/08/2017 Target Resolution Date: 01/17/2018 Goal Status: Active Interventions: Provide education on orientation to the wound center Notes: ` Wound/Skin Impairment Nursing Diagnoses: Impaired tissue integrity Goals: Ulcer/skin breakdown will heal within 14 weeks Date Initiated: 11/08/2017 Target Resolution Date: 01/17/2018 Goal Status: Active Interventions: JALYN, ROSERO ([id number]) Assess patient/caregiver ability to obtain  necessary supplies Assess patient/caregiver ability to perform ulcer/skin care regimen upon admission and as needed Assess ulceration(s) every visit Notes: Electronic Signature(s) Signed: 11/29/2017 4:49:47 PM By: [name] Entered By: [name] on 11/29/2017 15:44:33 Roselli, Kroy M. ([id number]) -------------------------------------------------------------------------------- Pain Assessment Details Patient Name: Eduardo [name]. Date of Service: 11/29/2017 3:15 PM Medical Record Number: [id number] Patient Account Number: [id number] Date of Birth/Sex: 05/11/1931 (84 y.o. M) Treating RN: Secundino Ginger Primary Care Candela Krul: [name] Other Clinician: Referring Emad Brechtel: [name] Treating Kanoelani Dobies/Extender: Melburn Hake, HOYT Weeks in Treatment: 3 Active Problems Location of Pain Severity and Description of Pain Patient Has Paino No Site Locations Pain Management and Medication Current Pain Management: Electronic Signature(s) Signed: 12/03/2017 11:20:16 AM By: Secundino Ginger Entered By: Secundino Ginger on 11/29/2017 15:28:14 Morimoto, Archie M. ([id number]) -------------------------------------------------------------------------------- Patient/Caregiver Education Details Patient Name: Eduardo [name]. Date of Service: 11/29/2017 3:15 PM Medical Record Number: [id number] Patient Account Number: [id number] Date of Birth/Gender: 07-Jul-1930 (84 y.o. M) Treating RN: Ahmed Prima Primary Care Physician: [name] Other Clinician: Referring Physician: [name] Treating Physician/Extender: Melburn Hake,  HOYT Weeks in Treatment: 3 Education Assessment Education Provided To: Patient Education Topics Provided Wound/Skin Impairment: Handouts: Caring for Your Ulcer, Skin Care Do's and Dont's, Other: change dressing as ordered Methods: Demonstration, Explain/Verbal Responses: State content correctly Electronic Signature(s) Signed: 12/04/2017 4:02:05 PM By:  Alric Quan Entered By: Alric Quan on 11/29/2017 15:52:36 Mcdonell, Brand M. ([id number]) -------------------------------------------------------------------------------- Wound Assessment Details Patient Name: Clayton Lefort M. Date of Service: 11/29/2017 3:15 PM Medical Record Number: [id number] Patient Account Number: [id number] Date of Birth/Sex: 1930/11/30 (84 y.o. M) Treating RN: Secundino Ginger Primary Care Blaire Hodsdon: [name] Other Clinician: Referring Micajah Dennin: [name] Treating Kaimani Clayson/Extender: Melburn Hake, HOYT Weeks in Treatment: 3 Wound Status Wound Number: 1 Primary Trauma, Other Etiology: Wound Location: Right Lower Leg Wound Open Wounding Event: Trauma Status: Date Acquired: 10/15/2017 Comorbid Glaucoma, Chronic Obstructive Pulmonary Weeks Of Treatment: 3 History: Disease (COPD), Arrhythmia, Congestive Heart Clustered Wound: No Failure, Coronary Artery Disease, Myocardial Infarction, Neuropathy Photos Photo Uploaded By: Secundino Ginger on 11/29/2017 15:43:23 Wound Measurements Length: (cm) 3.2 Width: (cm) 2 Depth: (cm) 0.3 Area: (cm) 5.027 Volume: (cm) 1.508 % Reduction in Area: 0% % Reduction in Volume: 25% Epithelialization: None Tunneling: No Undermining: No Wound Description Full Thickness Without Exposed Support Foul Odo Classification: Structures Slough/F Wound Margin: Thickened Exudate Small Amount: Exudate Type: Serous Exudate Color: amber r After Cleansing: No ibrino Yes Wound Bed Granulation Amount: Small (1-33%) Exposed Structure Granulation Quality: Pink Fascia Exposed: No Necrotic Amount: Large (67-100%) Fat Layer (Subcutaneous Tissue) Exposed: Yes Necrotic Quality: Eschar, Adherent Slough Tendon Exposed: No Muscle Exposed: No Joint Exposed: No Cureton, Bayani M. ([id number]) Bone Exposed: No Periwound Skin Texture Texture Color No Abnormalities Noted: No No Abnormalities Noted: No Callus: No Atrophie  Blanche: No Crepitus: No Cyanosis: No Excoriation: No Ecchymosis: No Induration: Yes Erythema: Yes Rash: No Erythema Location: Circumferential Scarring: No Hemosiderin Staining: No Mottled: No Moisture Pallor: No No Abnormalities Noted: No Rubor: No Dry / Scaly: No Maceration: No Temperature / Pain Temperature: No Abnormality Tenderness on Palpation: Yes Wound Preparation Ulcer Cleansing: Rinsed/Irrigated with Saline Topical Anesthetic Applied: Other: lidocaine 4%, Treatment Notes Wound #1 (Right Lower Leg) 1. Cleansed with: Clean wound with Normal Saline 2. Anesthetic Topical Lidocaine 4% cream to wound bed prior to debridement 4. Dressing Applied: Prisma Ag 5. Secondary Dressing Applied Bordered Foam Dressing Electronic Signature(s) Signed: 12/03/2017 11:20:16 AM By: Secundino Ginger Entered By: Secundino Ginger on 11/29/2017 15:35:47 Crocket, Fitzroy M. ([id number]) -------------------------------------------------------------------------------- [place] Details Patient Name: Eduardo [name]. Date of Service: 11/29/2017 3:15 PM Medical Record Number: [id number] Patient Account Number: [id number] Date of Birth/Sex: September 16, 1930 (84 y.o. M) Treating RN: Secundino Ginger Primary Care Radley Teston: [name] Other Clinician: Referring Tashyra Adduci: [name] Treating Loda Bialas/Extender: Melburn Hake, HOYT Weeks in Treatment: 3 Vital Signs Time Taken: 15:28 Temperature (F): 98.4 Height (in): 62 Pulse (bpm): 78 Weight (lbs): 153.7 Respiratory Rate (breaths/min): 16 Body Mass Index (BMI): 28.1 Blood Pressure (mmHg): 148/62 Reference Range: 80 - 120 mg / dl Electronic Signature(s) Signed: 12/03/2017 11:20:16 AM By: Secundino Ginger Entered BySecundino Ginger on 11/29/2017 15:29:16

## 2017-12-06 ENCOUNTER — Encounter: Payer: PPO | Admitting: Physician Assistant

## 2017-12-06 ENCOUNTER — Encounter: Payer: Self-pay | Admitting: Internal Medicine

## 2017-12-06 DIAGNOSIS — L97812 Non-pressure chronic ulcer of other part of right lower leg with fat layer exposed: Secondary | ICD-10-CM | POA: Diagnosis not present

## 2017-12-08 ENCOUNTER — Encounter: Payer: Self-pay | Admitting: Internal Medicine

## 2017-12-08 NOTE — Progress Notes (Signed)
Eduardo [name], Eduardo [name] ([id number]) Visit Report for 12/06/2017 Arrival Information Details Patient Name: Eduardo [name], Eduardo [name]. Date of Service: 12/06/2017 3:45 PM Medical Record Number: [id number] Patient Account Number: [id number] Date of Birth/Sex: 06-03-1931 (84 y.o. M) Treating RN: Secundino Ginger Primary Care Rudine Rieger: [name] Other Clinician: Referring Dany Walther: [name] Treating Nickalous Stingley/Extender: Melburn Hake, HOYT Weeks in Treatment: 4 Visit Information History Since Last Visit Added or deleted any medications: No Patient Arrived: Walker Any new allergies or adverse reactions: No Arrival Time: 15:38 Had a fall or experienced change in No Accompanied By: family activities of daily living that may affect Transfer Assistance: None risk of falls: Patient Identification Verified: Yes Signs or symptoms of abuse/neglect since last visito No Secondary Verification Process Yes Hospitalized since last visit: No Completed: Implantable device outside of the clinic excluding No Patient Has Alerts: Yes cellular tissue based products placed in the center Patient Alerts: Patient on Blood since last visit: Thinner Has Dressing in Place as Prescribed: Yes Eliquis Pain Present Now: No 11/08/17 ABI >220 Biateral Electronic Signature(s) Signed: 12/06/2017 4:01:49 PM By: Secundino Ginger Entered By: Secundino Ginger on 12/06/2017 15:39:35 [name], [name] ([id number]) -------------------------------------------------------------------------------- Encounter Discharge Information Details Patient Name: Eduardo [name]. Date of Service: 12/06/2017 3:45 PM Medical Record Number: [id number] Patient Account Number: [id number] Date of Birth/Sex: [birth year] (84 y.o. M) Treating RN: Roger Shelter Primary Care Tamella Tuccillo: [name] Other Clinician: Referring Khaalid Lefkowitz: [name] Treating Ketty Bitton/Extender: Melburn Hake, HOYT Weeks in Treatment: 4 Encounter Discharge Information  Items Discharge Condition: Stable Ambulatory Status: Walker Discharge Destination: Home Transportation: Private Auto Accompanied By: daughter Schedule Follow-up Appointment: Yes Clinical Summary of Care: Electronic Signature(s) Signed: 12/06/2017 4:44:15 PM By: Roger Shelter Entered By: Roger Shelter on 12/06/2017 16:35:26 [name], [name] ([id number]) -------------------------------------------------------------------------------- Lower Extremity Assessment Details Patient Name: Eduardo [name]. Date of Service: 12/06/2017 3:45 PM Medical Record Number: [id number] Patient Account Number: [id number] Date of Birth/Sex: 19-Oct-1930 (84 y.o. M) Treating RN: Secundino Ginger Primary Care Irving Bloor: [name] Other Clinician: Referring [place] Gipe: [name] Treating Armonie Staten/Extender: Melburn Hake, HOYT Weeks in Treatment: 4 Edema Assessment Assessed: [name]: No] [name]: No] [Left: Edema] [Right: :] Calf Left: Right: Point of Measurement: 30 cm From Medial Instep cm 29.5 cm Ankle Left: Right: Point of Measurement: 10 cm From Medial Instep cm 21.5 cm Vascular Assessment Claudication: Claudication Assessment [Right:None] Pulses: Dorsalis Pedis Palpable: [Right:Yes] Posterior Tibial Extremity colors, hair growth, and conditions: Extremity Color: [Right:Normal] Temperature of Extremity: [Right:Warm] Capillary Refill: [Right:< 3 seconds] Toe Nail Assessment Left: Right: Thick: No Discolored: No Deformed: No Improper Length and Hygiene: No Electronic Signature(s) Signed: 12/06/2017 4:01:49 PM By: Secundino Ginger Entered By: Secundino Ginger on 12/06/2017 15:54:23 Eduardo [name], [name] ([id number]) -------------------------------------------------------------------------------- Multi Wound Chart Details Patient Name: Eduardo [name]. Date of Service: 12/06/2017 3:45 PM Medical Record Number: [id number] Patient Account Number: [id number] Date of Birth/Sex: [birth year] (84 y.o.  M) Treating RN: [name] Primary Care Jendaya Gossett: [name] Other Clinician: Referring Severin Bou: [name] Treating Merion Caton/Extender: Melburn Hake, HOYT Weeks in Treatment: 4 Vital Signs Height(in): 62 Pulse(bpm): Weight(lbs): 153.7 Blood Pressure(mmHg): 138/70 Body Mass Index(BMI): 28 Temperature(F): 98.7 Respiratory Rate (breaths/min): Photos: [N/A:N/A] Wound Location: Right Lower Leg N/A N/A Wounding Event: Trauma N/A N/A Primary Etiology: Trauma, Other N/A N/A Comorbid History: Glaucoma, Chronic N/A N/A Obstructive Pulmonary Disease (COPD), Arrhythmia, Congestive Heart Failure, Coronary Artery Disease, Myocardial Infarction, Neuropathy Date Acquired: 10/15/2017 N/A N/A Weeks of Treatment: 4 N/A N/A Wound Status: Open N/A N/A Measurements L x W x D 2.3x1.5x0.3  N/A N/A (cm) Area (cm) : 2.71 N/A N/A Volume (cm) : 0.813 N/A N/A % Reduction in Area: 46.10% N/A N/A % Reduction in Volume: 59.60% N/A N/A Classification: Full Thickness Without N/A N/A Exposed Support Structures Exudate Amount: Small N/A N/A Exudate Type: Serous N/A N/A Exudate Color: amber N/A N/A Wound Margin: Thickened N/A N/A Granulation Amount: Small (1-33%) N/A N/A Granulation Quality: Pink N/A N/A Necrotic Amount: Large (67-100%) N/A N/A Necrotic Tissue: Eschar, Adherent Slough N/A N/A Exposed Structures: Fat Layer (Subcutaneous N/A N/A Tissue) Exposed: Yes [name], [name] ([id number]) Fascia: No Tendon: No Muscle: No Joint: No Bone: No Epithelialization: None N/A N/A Periwound Skin Texture: Induration: Yes N/A N/A Excoriation: No Callus: No Crepitus: No Rash: No Scarring: No Periwound Skin Moisture: Maceration: No N/A N/A Dry/Scaly: No Periwound Skin Color: Erythema: Yes N/A N/A Atrophie Blanche: No Cyanosis: No Ecchymosis: No Hemosiderin Staining: No Mottled: No Pallor: No Rubor: No Erythema Location: Circumferential N/A N/A Temperature: No Abnormality  N/A N/A Tenderness on Palpation: Yes N/A N/A Wound Preparation: Ulcer Cleansing: N/A N/A Rinsed/Irrigated with Saline Topical Anesthetic Applied: Other: lidocaine 4% Treatment Notes Electronic Signature(s) Signed: 12/06/2017 4:59:39 PM By: [name] Entered By: [name] on 12/06/2017 16:14:41 Eduardo [name], [name] ([id number]) -------------------------------------------------------------------------------- Multi-Disciplinary Care Plan Details Patient Name: Eduardo [name]. Date of Service: 12/06/2017 3:45 PM Medical Record Number: [id number] Patient Account Number: [id number] Date of Birth/Sex: Mar 23, 1931 (84 y.o. M) Treating RN: [name] Primary Care Maryalice Pasley: [name] Other Clinician: Referring Zell Doucette: [name] Treating Janki Dike/Extender: Melburn Hake, HOYT Weeks in Treatment: 4 Active Inactive ` Abuse / Safety / Falls / Self Care Management Nursing Diagnoses: Potential for falls Goals: Patient will remain injury free related to falls Date Initiated: 11/08/2017 Target Resolution Date: 01/17/2018 Goal Status: Active Interventions: Assess fall risk on admission and as needed Notes: ` Orientation to the Wound Care Program Nursing Diagnoses: Knowledge deficit related to the wound healing center program Goals: Patient/caregiver will verbalize understanding of the [facility] Program Date Initiated: 11/08/2017 Target Resolution Date: 01/17/2018 Goal Status: Active Interventions: Provide education on orientation to the wound center Notes: ` Wound/Skin Impairment Nursing Diagnoses: Impaired tissue integrity Goals: Ulcer/skin breakdown will heal within 14 weeks Date Initiated: 11/08/2017 Target Resolution Date: 01/17/2018 Goal Status: Active Interventions: Eduardo [name], Eduardo [name] ([id number]) Assess patient/caregiver ability to obtain necessary supplies Assess patient/caregiver ability to perform ulcer/skin care regimen upon admission and  as needed Assess ulceration(s) every visit Notes: Electronic Signature(s) Signed: 12/06/2017 4:59:39 PM By: [name] Entered By: [name] on 12/06/2017 16:13:33 Eduardo [name], [name] ([id number]) -------------------------------------------------------------------------------- Pain Assessment Details Patient Name: Eduardo [name]. Date of Service: 12/06/2017 3:45 PM Medical Record Number: [id number] Patient Account Number: [id number] Date of Birth/Sex: July 22, 1930 (84 y.o. M) Treating RN: Secundino Ginger Primary Care Lachlyn Vanderstelt: [name] Other Clinician: Referring Eriel Doyon: [name] Treating Jessie Cowher/Extender: Melburn Hake, HOYT Weeks in Treatment: 4 Active Problems Location of Pain Severity and Description of Pain Patient Has Paino No Site Locations Pain Management and Medication Current Pain Management: Goals for Pain Management topical or injectable lidocaine is offered to patient for acute pain when surgical debridement is performed. if needed, Patient is instructed to use over the counter pain medication for the following 24-48 hours after debridement. wound care MDs do not prescribe pain medications. patient has chronic pain or uncontrolled pain. patient has been instructed to make appointment with their primary care physician for pain management. Electronic Signature(s) Signed: 12/06/2017 4:01:16 PM By: Secundino Ginger Entered By: Secundino Ginger on  12/06/2017 16:01:16 Eduardo [name], Eduardo [name] ([id number]) -------------------------------------------------------------------------------- Patient/Caregiver Education Details Patient Name: Eduardo [name], Eduardo [name]. Date of Service: 12/06/2017 3:45 PM Medical Record Number: [id number] Patient Account Number: [id number] Date of Birth/Gender: 1931-03-01 (84 y.o. M) Treating RN: Roger Shelter Primary Care Physician: [name] Other Clinician: Referring Physician: [name] Treating Physician/Extender: Sharalyn Ink  in Treatment: 4 Education Assessment Education Provided To: Patient Education Topics Provided Wound Debridement: Handouts: Wound Debridement Methods: Explain/Verbal Responses: State content correctly Wound/Skin Impairment: Handouts: Caring for Your Ulcer Methods: Explain/Verbal Responses: State content correctly Electronic Signature(s) Signed: 12/06/2017 4:44:15 PM By: Roger Shelter Entered By: Roger Shelter on 12/06/2017 16:35:42 Eduardo [name], [name] ([id number]) -------------------------------------------------------------------------------- Wound Assessment Details Patient Name: Eduardo [name]. Date of Service: 12/06/2017 3:45 PM Medical Record Number: [id number] Patient Account Number: [id number] Date of Birth/Sex: January 14, 1931 (84 y.o. M) Treating RN: Secundino Ginger Primary Care Keigen Caddell: [name] Other Clinician: Referring Michelle Vanhise: [name] Treating Dwight Burdo/Extender: Melburn Hake, HOYT Weeks in Treatment: 4 Wound Status Wound Number: 1 Primary Trauma, Other Etiology: Wound Location: Right Lower Leg Wound Open Wounding Event: Trauma Status: Date Acquired: 10/15/2017 Comorbid Glaucoma, Chronic Obstructive Pulmonary Weeks Of Treatment: 4 History: Disease (COPD), Arrhythmia, Congestive Heart Clustered Wound: No Failure, Coronary Artery Disease, Myocardial Infarction, Neuropathy Photos Photo Uploaded By: Secundino Ginger on 12/06/2017 16:00:47 Wound Measurements Length: (cm) 2.3 Width: (cm) 1.5 Depth: (cm) 0.3 Area: (cm) 2.71 Volume: (cm) 0.813 % Reduction in Area: 46.1% % Reduction in Volume: 59.6% Epithelialization: None Tunneling: No Undermining: No Wound Description Full Thickness Without Exposed Support Foul Odo Classification: Structures Slough/F Wound Margin: Thickened Exudate Small Amount: Exudate Type: Serous Exudate Color: amber r After Cleansing: No ibrino Yes Wound Bed Granulation Amount: Small (1-33%) Exposed  Structure Granulation Quality: Pink Fascia Exposed: No Necrotic Amount: Large (67-100%) Fat Layer (Subcutaneous Tissue) Exposed: Yes Necrotic Quality: Eschar, Adherent Slough Tendon Exposed: No Muscle Exposed: No Joint Exposed: No Bone Exposed: No Periwound Skin Texture Eduardo [name], [name] ([id number]) Texture Color No Abnormalities Noted: No No Abnormalities Noted: No Callus: No Atrophie Blanche: No Crepitus: No Cyanosis: No Excoriation: No Ecchymosis: No Induration: Yes Erythema: Yes Rash: No Erythema Location: Circumferential Scarring: No Hemosiderin Staining: No Mottled: No Moisture Pallor: No No Abnormalities Noted: No Rubor: No Dry / Scaly: No Maceration: No Temperature / Pain Temperature: No Abnormality Tenderness on Palpation: Yes Wound Preparation Ulcer Cleansing: Rinsed/Irrigated with Saline Topical Anesthetic Applied: Other: lidocaine 4%, Treatment Notes Wound #1 (Right Lower Leg) 1. Cleansed with: Clean wound with Normal Saline 2. Anesthetic Topical Lidocaine 4% cream to wound bed prior to debridement 4. Dressing Applied: Prisma Ag 5. Secondary Dressing Applied ABD Pad 7. Secured with 3 Layer Compression System - Right Lower Extremity Electronic Signature(s) Signed: 12/06/2017 4:01:49 PM By: Secundino Ginger Entered By: Secundino Ginger on 12/06/2017 15:52:13 Eduardo [name], [name] ([id number]) -------------------------------------------------------------------------------- [place] Details Patient Name: Eduardo [name]. Date of Service: 12/06/2017 3:45 PM Medical Record Number: [id number] Patient Account Number: [id number] Date of Birth/Sex: [birth year] (84 y.o. M) Treating RN: Secundino Ginger Primary Care Shah Insley: [name] Other Clinician: Referring Tenicia Gural: [name] Treating Jaleigha Deane/Extender: Melburn Hake, HOYT Weeks in Treatment: 4 Vital Signs Time Taken: 15:35 Temperature (F): 98.7 Height (in): 62 Blood Pressure (mmHg): 138/70 Weight  (lbs): 153.7 Reference Range: 80 - 120 mg / dl Body Mass Index (BMI): 28.1 Electronic Signature(s) Signed: 12/06/2017 4:01:49 PM By: Secundino Ginger Entered By: Secundino Ginger on 12/06/2017 15:46:00

## 2017-12-09 ENCOUNTER — Encounter: Payer: Self-pay | Admitting: Pulmonary Disease

## 2017-12-09 ENCOUNTER — Ambulatory Visit: Payer: PPO | Admitting: Pulmonary Disease

## 2017-12-09 VITALS — BP 128/70 | HR 76 | Ht 62.0 in | Wt 150.8 lb

## 2017-12-09 DIAGNOSIS — J301 Allergic rhinitis due to pollen: Secondary | ICD-10-CM

## 2017-12-09 DIAGNOSIS — J439 Emphysema, unspecified: Secondary | ICD-10-CM | POA: Diagnosis not present

## 2017-12-09 MED ORDER — DOXYCYCLINE HYCLATE 100 MG PO TABS: 100.0000 mg | ORAL_TABLET | Freq: Two times a day (BID) | ORAL | 0 refills | Status: DC

## 2017-12-09 MED ORDER — PREDNISONE 10 MG PO TABS: ORAL_TABLET | ORAL | 0 refills | Status: DC

## 2017-12-09 NOTE — Progress Notes (Signed)
@Patient  ID: Eduardo [name], male    DOB: [birth year], 84 y.o.   MRN: [id number]  Chief Complaint  Patient presents with  . Acute Visit    Increased SOB and wheezing x 1 week, dry cough, chest discomfort. Denies chest pain. Having increased back pain from fall 2 weeks ago.     Referring provider: Venia Carbon, MD  HPI: 84 year old former smoker seen by Dr. Lamonte Sakai.  Chronic cough.  Has COPD.  Recent [facility] Encounters:  ROV 08/05/17 --84 year old former smoker with a history of coronary artery disease, atrial fibrillation, allergic rhinitis, chronic cough with upper airway instability.  He also has COPD with an FEV1 of 1.03 L.  He is currently managed on Stiolto.  Also uses Zyrtec, Atrovent nasal spray for his chronic rhinitis. He still goes to the gym 3x a week. He began to get some increased congestion beginning of the year. Has been using mucinex, robutussin. Reliable with Stiolto. He has albuterol nebs, uses about 1x a day.   12/09/17 Acute Patient presenting today for office visit with continued symptoms of shortness of breath, wheezing, fatigue.  Patient reporting that he is using his Stiolto inhaler, daily antihistamine, rescue inhaler, nebulizers.  Patient also reporting that he fell at home and has seen primary care for this.  Patient reporting that he needs a refill of his hydrocodone for his back pain.   Allergies  Allergen Reactions  . Penicillins Itching    Has patient had a PCN reaction causing immediate rash, facial/tongue/throat swelling, SOB or lightheadedness with hypotension: Yes Has patient had a PCN reaction causing severe rash involving mucus membranes or skin necrosis: No Has patient had a PCN reaction that required hospitalization No Has patient had a PCN reaction occurring within the last 10 years: No If all of the above answers are "NO", then may proceed with Cephalosporin use.   . Sulfonamide Derivatives Other (See Comments)    "it affected  my kidneys"  . Tape Other (See Comments)    Arms black and blue, tears skin.  Please use "paper" tape    Immunization History  Administered Date(s) Administered  . Influenza Split 03/12/2011, 05/12/2012  . Influenza Whole 05/31/2009  . Influenza,inj,Quad PF,6+ Mos 03/05/2013, 02/17/2014, 03/11/2015, 04/03/2016, 02/05/2017  . Pneumococcal Conjugate-13 04/19/2014  . Pneumococcal Polysaccharide-23 06/11/2005, 06/24/2017  . Td 12/27/2010  . Zoster 09/09/2006    Past Medical History:  Diagnosis Date  . Allergy   . Anemia 2012, 08/2015   chronic. Acute due to large hematoma 2013. 2017: due to gastric AVM bleeding.   . Anxiety   . Atrial fibrillation ([place]) 2012   a.  CHA2DS2VASc = 6-->eliquis;  b. 12/2015 Echo: EF 60-65%, no rwma, LVH, mild AS/MS/MR, sev dil LA, mild TR, PASP 57mmHg.  Marland Kitchen CAD (coronary artery disease)    a. 06/2010 Cath: LM nl, LAD 50p/m, LCX 98m, RCA 50p/m -->catheter dissection but good flow, 70d, RPDA 50-70;  b. 06/2010 Relook cath due to bradycardia and inf ST elev: RCA dissection @ ostium extending into coronary cusp and prox RCA, Ao dissection-->less staining than previously-->Med Rx.  Marland Kitchen COPD (chronic obstructive pulmonary disease) ([place]) 2013   exertional dyspnea  . Gastric angiodysplasia with hemorrhage 08/2015  . Hx of colonic polyps 2001, 2011   adenomatous 2001. HP 2001, 2011.  Marland Kitchen Hyperlipidemia   . Myocardial infarction ([place]) 06/2010.  12/2011.   "twice; 1 day apart; during/after cath". NQMI in setting severe anemia 2013.   Marland Kitchen Neuropathy 2014  in feet, likely due to spinal stenosis, DDD, HNP  . Osteoarthritis 2002   spinal stenosis, spondylolisthesis, disc protrusion multilevel in lumbar spine  . Stroke ([place]) 2016  . Upper GI bleed 08/2015   EGD: 2 small gastric AVMs, 1 actively bleeding.  Both treated with APC ablation, clipping of the bleeder    Tobacco History: Social History   Tobacco Use  Smoking Status Former Smoker  . Packs/day: 2.50  . Years: 30.00    . Pack years: 75.00  . Types: Cigarettes  . Last attempt to quit: 06/11/1978  . Years since quitting: 39.5  Smokeless Tobacco Never Used   Counseling given: Yes Continue to not smoking   Outpatient Encounter Medications as of 12/09/2017  Medication Sig  . acetaminophen (TYLENOL) 650 MG CR tablet Take 1,300 mg by mouth 2 (two) times daily.  Marland Kitchen albuterol (PROAIR HFA) 108 (90 Base) MCG/ACT inhaler Inhale 2 puffs into the lungs every 6 (six) hours as needed for wheezing or shortness of breath.  Marland Kitchen albuterol (PROVENTIL) (2.5 MG/3ML) 0.083% nebulizer solution use 1 vial in nebulizer every 4 hours as needed for wheezing or shortness of breath  . atorvastatin (LIPITOR) 80 MG tablet TAKE ONE TABLET BY MOUTH ONE TIME DAILY   . azelastine (ASTELIN) 0.1 % nasal spray Place 2 sprays into both nostrils at bedtime. Use in each nostril as directed  . B Complex-C (SUPER B COMPLEX PO) Take 1 tablet by mouth at bedtime.   . cetirizine (ZYRTEC) 10 MG tablet Take 10 mg by mouth at bedtime.   . cholecalciferol (VITAMIN D) 1000 units tablet Take 1,000 Units by mouth daily.  Marland Kitchen doxycycline (VIBRA-TABS) 100 MG tablet Take 1 tablet (100 mg total) by mouth 2 (two) times daily.  Marland Kitchen ELIQUIS 5 MG TABS tablet TAKE 1 TABLET BY MOUTH 2 TIMES DAILY  . escitalopram (LEXAPRO) 20 MG tablet Take 20 mg by mouth at bedtime.   . fenofibrate 160 MG tablet TAKE 1 TABLET BY MOUTH AT BEDTIME.  . furosemide (LASIX) 20 MG tablet TAKE 1 TABLET BY MOUTH DAILY  . hydrALAZINE (APRESOLINE) 10 MG tablet Take 1 tablet (10 mg total) by mouth 3 (three) times daily.  Marland Kitchen ipratropium (ATROVENT) 0.03 % nasal spray PLACE 2 SPRAYS INTO BOTH NOSTRILS AT BEDTIME.  . IRON PO Take by mouth.  . lamoTRIgine (LAMICTAL) 150 MG tablet Take 150 mg by mouth at bedtime. Reported on 08/14/2015  . montelukast (SINGULAIR) 10 MG tablet Take 1 tablet (10 mg total) by mouth at bedtime.  . Multiple Vitamin (MULTIVITAMIN) tablet Take 1 tablet by mouth at bedtime.   .  pantoprazole (PROTONIX) 40 MG tablet TAKE 1 TABLET BY MOUTH DAILY BEFORE BREAKFAST.  Marland Kitchen predniSONE (DELTASONE) 10 MG tablet 4 tabs for 2 days, then 3 tabs for 2 days, 2 tabs for 2 days, then 1 tab for 2 days, then stop  . Tiotropium Bromide-Olodaterol (STIOLTO RESPIMAT) 2.5-2.5 MCG/ACT AERS Inhale 2 puffs into the lungs daily.  . tizanidine (ZANAFLEX) 2 MG capsule Take 1 capsule (2 mg total) by mouth 3 (three) times daily as needed for muscle spasms.  Marland Kitchen triamcinolone cream (KENALOG) 0.1 % Apply 1 application topically 2 (two) times daily as needed (for skin).   . Turmeric 500 MG TABS Take 2 tablets by mouth.  . verapamil (CALAN-SR) 240 MG CR tablet TAKE 1 TABLET BY MOUTH AT BEDTIME   No facility-administered encounter medications on file as of 12/09/2017.      Review of Systems  Constitutional: +  Fatigue no  weight loss, night sweats,  fevers, chills HEENT: + Nasal congestion, sneezing, runny nose no headaches,  Difficulty swallowing,  Tooth/dental problems, or  Sore throat, No itching, ear ache CV: No chest pain,  orthopnea, PND, swelling in lower extremities, anasarca, dizziness, palpitations, syncope  GI: No heartburn, indigestion, abdominal pain, nausea, vomiting, diarrhea, change in bowel habits, loss of appetite, bloody stools Resp: + Shortness of breath with exertion as well as at rest, wheezing, occasional dry cough no shortness of breath with exertion or at rest.  No excess mucus, no productive cough,  No non-productive cough,  No coughing up of blood.  No change in color of mucus.  No wheezing.  No chest wall deformity Skin: no rash, lesions, no skin changes. GU: no dysuria, change in color of urine, no urgency or frequency.  No flank pain, no hematuria  MS:  No joint pain or swelling.  No decreased range of motion.  No back pain. Psych:  No change in mood or affect. No depression or anxiety.  No memory loss.   Physical Exam  BP 128/70   Pulse 76   Ht 5\' 2"  (1.575 m)   Wt 150 lb  12.8 oz (68.4 kg)   SpO2 93%   BMI 27.58 kg/m    Wt Readings from Last 3 Encounters:  12/09/17 150 lb 12.8 oz (68.4 kg)  12/04/17 150 lb 12 oz (68.4 kg)  12/04/17 151 lb (68.5 kg)     GEN: A/Ox3; pleasant , NAD, well nourished    HEENT:  [place]/AT,  EACs-clear, TMs-wnl, NOSE-clear, THROAT- +post nasal drip  NECK:  Supple w/ fair ROM; no JVD; normal carotid impulses w/o bruits; no thyromegaly or nodules palpated; no lymphadenopathy.    RESP:  +inspiratory and expiratory wheezes, good air movement in all lobes, no accessory muscle use, no dullness to percussion  CARD:  Reg irreg , no m/r/g, no peripheral edema, pulses intact, no cyanosis or clubbing.  GI:   Soft & nt; nml bowel sounds; no organomegaly or masses detected.   Musco: Warm bil, no deformities or joint swelling noted.   Neuro: alert, no focal deficits noted.    Skin: Warm, no lesions or rashes    Lab Results:  CBC    Component Value Date/Time   WBC 7.0 07/01/2017 1218   WBC 6.8 06/24/2017 1709   RBC 3.56 (L) 07/01/2017 1218   HGB 10.2 (L) 07/01/2017 1218   HGB 11.4 (L) 09/08/2011 0956   HCT 32.5 (L) 07/01/2017 1218   HCT 35.4 (L) 09/08/2011 0956   PLT 175 07/01/2017 1218   PLT 203 09/08/2011 0956   MCV 91.3 07/01/2017 1218   MCV 85 09/08/2011 0956   MCH 28.7 07/01/2017 1218   MCHC 31.4 (L) 07/01/2017 1218   RDW 15.7 (H) 07/01/2017 1218   RDW 14.7 (H) 09/08/2011 0956   LYMPHSABS 1.3 07/01/2017 1218   MONOABS 0.5 07/01/2017 1218   EOSABS 0.3 07/01/2017 1218   BASOSABS 0.0 07/01/2017 1218    BMET    Component Value Date/Time   NA 141 07/01/2017 1218   NA 143 08/08/2016 0847   NA 142 09/08/2011 0956   K 4.3 07/01/2017 1218   K 3.9 09/08/2011 0956   CL 106 07/01/2017 1218   CL 105 09/08/2011 0956   CO2 26 07/01/2017 1218   CO2 26 09/08/2011 0956   GLUCOSE 83 07/01/2017 1218   GLUCOSE 97 09/08/2011 0956   BUN 35 (H) 07/01/2017 1218  BUN 25 08/08/2016 0847   BUN 20 (H) 09/08/2011 0956    CREATININE 1.21 07/01/2017 1218   CREATININE 1.01 08/22/2015 0957   CALCIUM 9.9 07/01/2017 1218   CALCIUM 9.0 09/08/2011 0956   GFRNONAA 53 (L) 07/01/2017 1218   GFRNONAA >60 09/08/2011 0956   GFRAA >60 07/01/2017 1218   GFRAA >60 09/08/2011 0956    BNP    Component Value Date/Time   BNP 261.9 (H) 07/13/2016 2303    ProBNP    Component Value Date/Time   PROBNP 1,562 (H) 07/30/2016 1215   PROBNP 3,108.0 (H) 12/28/2011 0630    Imaging: US Carotid Bilateral  Result Date: 11/28/2017 CLINICAL DATA:  TIA. History of hyperlipidemia and CAD (history of myocardial infarction). EXAM: BILATERAL CAROTID DUPLEX ULTRASOUND TECHNIQUE: Pearline Cables scale imaging, color Doppler and duplex ultrasound were performed of bilateral carotid and vertebral arteries in the neck. COMPARISON:  None. FINDINGS: Criteria: Quantification of carotid stenosis is based on velocity parameters that correlate the residual internal carotid diameter with NASCET-based stenosis levels, using the diameter of the distal internal carotid lumen as the denominator for stenosis measurement. The following velocity measurements were obtained: RIGHT ICA:  79/19 cm/sec CCA:  43/15 cm/sec SYSTOLIC ICA/CCA RATIO:  0.9 ECA:  92 cm/sec LEFT ICA:  87/13 cm/sec CCA:  400/86 cm/sec SYSTOLIC ICA/CCA RATIO:  0.8 ECA:  104 cm/sec RIGHT CAROTID ARTERY: There is a moderate amount of eccentric echogenic plaque within the mid aspect of the right common carotid artery (images 8 and 9). There is a moderate to large amount of eccentric mixed echogenic plaque within the right carotid bulb (image 18), extending to involve the origin and proximal aspects of the right internal carotid artery (image 27, not resulting in elevated peak systolic velocities within the interrogated course the right internal carotid artery to suggest a hemodynamically significant stenosis. RIGHT VERTEBRAL ARTERY:  Antegrade flow LEFT CAROTID ARTERY: There is a minimal amount atherosclerotic  plaque and intimal thickening seen throughout the left common carotid artery (representative images 43 and 47). There is a minimal to moderate amount of atherosclerotic plaque within the left carotid bulb, extending to involve the origin and proximal aspects of the left internal carotid artery (image 61), not resulting in elevated peak systolic velocities within the interrogated course the left internal carotid artery to suggest a hemodynamically significant stenosis. LEFT VERTEBRAL ARTERY:  Antegrade Flow IMPRESSION: Moderate to large amount of bilateral atherosclerotic plaque, right greater than left, not resulting in a hemodynamically significant stenosis within either internal carotid artery. Electronically Signed   By: Sandi Mariscal M.D.   On: 11/28/2017 07:46   US Arterial Abi (screening Lower Extremity)  Result Date: 11/28/2017 CLINICAL DATA:  84 year old male with a history of right leg wound. Cardiovascular risk factors include smoking, hyperlipidemia, known vascular disease with prior procedures EXAM: NONINVASIVE PHYSIOLOGIC VASCULAR STUDY OF BILATERAL LOWER EXTREMITIES TECHNIQUE: Evaluation of both lower extremities was performed at rest, including calculation of ankle-brachial indices, multiple segmental pressure evaluation, segmental Doppler and segmental pulse volume recording. COMPARISON:  None. FINDINGS: Right: Resting ankle brachial index:  Non calculable Segmental blood pressure: Symmetric upper extremity pressures Doppler: Segmental Doppler at the right ankle demonstrates triphasic waveforms. Directed duplex of the right lower extremity demonstrates patent common femoral artery, profunda femoris, SFA, popliteal artery, posterior tibial artery, and dorsalis pedis. Triphasic waveforms maintained. Left: Resting ankle brachial index: Non calculable Segmental blood pressure: Symmetric upper extremity pressures Doppler: Segmental Doppler at the left ankle demonstrates triphasic waveforms Directed  duplex of  the left lower extremity demonstrates patent common femoral artery, profunda femoris, SFA, popliteal artery, anterior tibial artery, posterior tibial artery. Triphasic waveforms maintained throughout. IMPRESSION: Resting ABI of the bilateral lower extremities are non calculable. Triphasic waveforms maintained throughout the bilateral lower extremities. Signed, Dulcy Fanny. Dellia Nims, RPVI [facility] Electronically Signed   By: Corrie Mckusick D.O.   On: 11/28/2017 07:45   US Arterial Lower Extremity Duplex Bilateral  Result Date: 11/28/2017 CLINICAL DATA:  84 year old male with a history of right leg wound. Cardiovascular risk factors include smoking, hyperlipidemia, known vascular disease with prior procedures EXAM: NONINVASIVE PHYSIOLOGIC VASCULAR STUDY OF BILATERAL LOWER EXTREMITIES TECHNIQUE: Evaluation of both lower extremities was performed at rest, including calculation of ankle-brachial indices, multiple segmental pressure evaluation, segmental Doppler and segmental pulse volume recording. COMPARISON:  None. FINDINGS: Right: Resting ankle brachial index:  Non calculable Segmental blood pressure: Symmetric upper extremity pressures Doppler: Segmental Doppler at the right ankle demonstrates triphasic waveforms. Directed duplex of the right lower extremity demonstrates patent common femoral artery, profunda femoris, SFA, popliteal artery, posterior tibial artery, and dorsalis pedis. Triphasic waveforms maintained. Left: Resting ankle brachial index: Non calculable Segmental blood pressure: Symmetric upper extremity pressures Doppler: Segmental Doppler at the left ankle demonstrates triphasic waveforms Directed duplex of the left lower extremity demonstrates patent common femoral artery, profunda femoris, SFA, popliteal artery, anterior tibial artery, posterior tibial artery. Triphasic waveforms maintained throughout. IMPRESSION: Resting ABI of  the bilateral lower extremities are non calculable. Triphasic waveforms maintained throughout the bilateral lower extremities. Signed, Dulcy Fanny. Dellia Nims, RPVI [facility] Radiology Electronically Signed   By: Corrie Mckusick D.O.   On: 11/28/2017 07:45     Assessment & Plan:   COPD with exacerbation today  We will treat with prednisone taper as well as doxycycline.  Do not think that he needs a chest x-ray at this time.  We will have patient keep follow-up with Dr. Lamonte Sakai.  Discussed with patient that pain medication will need to be refilled by primary care as they are the one who is managing your back pain.  COPD (chronic obstructive pulmonary disease) with emphysema (HCC) Doxycycline >>> 1 100 mg tablet every 12 hours for 7 days >>>take with food  >>>wear sunscreen   Prednisone 10mg   >>>4 tabs for 2 days, then 3 tabs for 2 days, 2 tabs for 2 days, then 1 tab for 2 days, then stop >>>take with food   Note your daily symptoms > remember "red flags" for COPD:   >>>Increase in cough >>>increase in sputum production >>>increase in shortness of breath or activity  intolerance.   Allergic rhinitis Continue nasal saline rinses Can also resume Astelin nasal spray use as discussed in your appointment to     Lauraine Rinne, NP 12/09/2017

## 2017-12-09 NOTE — Assessment & Plan Note (Signed)
Doxycycline >>> 1 100 mg tablet every 12 hours for 7 days >>>take with food  >>>wear sunscreen   Prednisone 10mg   >>>4 tabs for 2 days, then 3 tabs for 2 days, 2 tabs for 2 days, then 1 tab for 2 days, then stop >>>take with food   Note your daily symptoms > remember "red flags" for COPD:   >>>Increase in cough >>>increase in sputum production >>>increase in shortness of breath or activity  intolerance.

## 2017-12-09 NOTE — Patient Instructions (Addendum)
Doxycycline >>> 1 100 mg tablet every 12 hours for 7 days >>>take with food  >>>wear sunscreen   Prednisone 10mg   >>>4 tabs for 2 days, then 3 tabs for 2 days, 2 tabs for 2 days, then 1 tab for 2 days, then stop >>>take with food   Note your daily symptoms > remember "red flags" for COPD:   >>>Increase in cough >>>increase in sputum production >>>increase in shortness of breath or activity  intolerance.   If you notice these symptoms, please call the office to be seen.   Follow-up with primary care regarding refilling your pain medication.  Since they are following you for your fall in your back pain they need to be the ones refilling this.  Keep follow-up with Dr. Lamonte Sakai in August/2019  Please contact the office if your symptoms worsen or you have concerns that you are not improving.   Thank you for choosing [facility] for your healthcare, and for allowing [place] to partner with you on your healthcare journey. I am thankful to be able to provide care to you today.   Wyn Quaker FNP-C

## 2017-12-09 NOTE — Assessment & Plan Note (Signed)
Continue nasal saline rinses Can also resume Astelin nasal spray use as discussed in your appointment to

## 2017-12-10 ENCOUNTER — Encounter: Payer: PPO | Attending: Physician Assistant | Admitting: Physician Assistant

## 2017-12-10 DIAGNOSIS — I509 Heart failure, unspecified: Secondary | ICD-10-CM | POA: Insufficient documentation

## 2017-12-10 DIAGNOSIS — I480 Paroxysmal atrial fibrillation: Secondary | ICD-10-CM | POA: Diagnosis not present

## 2017-12-10 DIAGNOSIS — Z7901 Long term (current) use of anticoagulants: Secondary | ICD-10-CM | POA: Insufficient documentation

## 2017-12-10 DIAGNOSIS — L97812 Non-pressure chronic ulcer of other part of right lower leg with fat layer exposed: Secondary | ICD-10-CM | POA: Insufficient documentation

## 2017-12-10 DIAGNOSIS — I2510 Atherosclerotic heart disease of native coronary artery without angina pectoris: Secondary | ICD-10-CM | POA: Insufficient documentation

## 2017-12-10 DIAGNOSIS — J449 Chronic obstructive pulmonary disease, unspecified: Secondary | ICD-10-CM | POA: Insufficient documentation

## 2017-12-10 NOTE — Progress Notes (Signed)
KISHAUN, EREKSON ([id number]) Visit Report for 12/06/2017 Chief Complaint Document Details Patient Name: Eduardo [name], Eduardo [name]. Date of Service: 12/06/2017 3:45 PM Medical Record Number: [id number] Patient Account Number: [id number] Date of Birth/Sex: 1931-04-12 (84 y.o. M) Treating RN: [name] Primary Care Provider: [name] Other Clinician: Referring Provider: [name] Treating Provider/Extender: Melburn Hake, Lylliana Kitamura Weeks in Treatment: 4 Information Obtained from: Patient Chief Complaint Right lower leg ulcer due to trauma Electronic Signature(s) Signed: 12/07/2017 12:34:34 PM By: Worthy Keeler PA-C Entered By: Worthy Keeler on 12/06/2017 15:50:51 Munce, Trystan M. ([id number]) -------------------------------------------------------------------------------- Debridement Details Patient Name: Eduardo [name]. Date of Service: 12/06/2017 3:45 PM Medical Record Number: [id number] Patient Account Number: [id number] Date of Birth/Sex: 1931/02/24 (84 y.o. M) Treating RN: [name] Primary Care Provider: [name] Other Clinician: Referring Provider: [name] Treating Provider/Extender: Melburn Hake, Brighid Koch Weeks in Treatment: 4 Debridement Performed for Wound #1 Right Lower Leg Assessment: Performed By: Physician STONE III, Taelar Gronewold E., PA-C Debridement Type: Debridement Pre-procedure Verification/Time Yes - 16:14 Out Taken: Start Time: 16:14 Pain Control: Lidocaine 4% Topical Solution Total Area Debrided (L x W): 2.3 (cm) x 1.5 (cm) = 3.45 (cm) Tissue and other material Viable, Non-Viable, Slough, Subcutaneous, Slough debrided: Level: Skin/Subcutaneous Tissue Debridement Description: Excisional Instrument: Curette Bleeding: Minimum Hemostasis Achieved: Pressure End Time: 16:17 Procedural Pain: 0 Post Procedural Pain: 0 Response to Treatment: Procedure was tolerated well Level of Consciousness: Awake and Alert Post Procedure  Vitals: Temperature: 98.7 Pulse: 67 Respiratory Rate: 18 Blood Pressure: Systolic Blood Pressure: 175 Diastolic Blood Pressure: 70 Post Debridement Measurements of Total Wound Length: (cm) 2.3 Width: (cm) 1.5 Depth: (cm) 0.4 Volume: (cm) 1.084 Character of Wound/Ulcer Post Debridement: Improved Post Procedure Diagnosis Same as Pre-procedure Electronic Signature(s) Signed: 12/06/2017 4:59:39 PM By: [name] Signed: 12/07/2017 12:34:34 PM By: Worthy Keeler PA-C Entered By: [name] on 12/06/2017 16:17:36 Eduardo [name], [name] ([id number]) -------------------------------------------------------------------------------- HPI Details Patient Name: Eduardo [name]. Date of Service: 12/06/2017 3:45 PM Medical Record Number: [id number] Patient Account Number: [id number] Date of Birth/Sex: Oct 25, 1930 (84 y.o. M) Treating RN: [name] Primary Care Provider: [name] Other Clinician: Referring Provider: [name] Treating Provider/Extender: Melburn Hake, Hannah Crill Weeks in Treatment: 4 History of Present Illness HPI Description: 11/08/17 on evaluation today patient presents initially concerning a right dramatic lower extremity ulcer/skin tear which occurred three weeks ago. He states that he initially was seen in urgent care where the area was sutured closed. The sutures stayed in for two weeks he tells me. Subsequently he was seen at Dr. Alla German office where the sutures were removed and Steri-Strips were placed over the next five days. Subsequently these were also removed and unfortunately at this time it was noted that the patient had a dehisced larger wound that required further attention. The patient and his wife actually requested referral to the wound care center in [place]. [name] made the referral to [place]. Subsequently the patient does have a history of atrial fibrillation, cardiovascular disease, COPD, stroke, and is on long-term Eliquis. There's no history of  diabetes that he states. Subsequently he has no other major medical problems currently. No fevers, chills, nausea, or vomiting noted at this time. 11/14/17-He is seen in follow-up evaluation for right posterior lower extremity ulcer. There is improvement with granulation tissue present; debridement. We will continue with same treatment plan he'll follow-up next week. The vascular office did call to establish an appointment, but he forgot that we had referred him and did not make an  appointment. He was encouraged to contact the office to schedule an appointment for evaluation. 11/22/17 on evaluation today patient actually appears to be doing well in regard to his ulcer. This has cleaned up very nicely in my pinion I do believe the Iodoflex has been of benefit for him. He actually does have his arterial study which is scheduled for 11/27/17. In general he's having no significant discomfort mainly just with cleansing of the wound but the dressings themselves don't seem to be causing him problems. 11/29/17 on evaluation today patient appears to be doing very well in regard to his lower extremity ulcer. With that being said he unfortunately prior to coming in for evaluation today did have a trip over a world where he failed hurting his back and striking his head. With that being said he has not had any altered mental status since that time and states that he's not really any more dizzy and there does not appear to be any residual effect from this. He also does not have a significant headache. With that being said this is something I did check out for him today briefly at least since he was in our office. I do think that he needs to look out for any signs or symptoms of anything worsening such as nausea, vomiting, fever, chills, confusion, or severe headaches. He has no visual disturbance at this point. 12/06/17 on evaluation today patient appears to be doing excellent in regard to his lower extremity ulcer.  This is starting to really fill in which is good news as well. We do have the results of the arterial study unfortunately they did not perform the TBI portion of the examination and his ABI's were noncompressible. With that being said they did state that he had triphasic blood flow throughout the bilateral lower extremities and there was apparently no evidence of arterial disease on the arterial ultrasound. Overall this is good news we will be able to initiate stronger compression therapy which should help to allow this wound to heal even faster as the patient still continues to have some pitting edema. Electronic Signature(s) Signed: 12/07/2017 12:34:34 PM By: Worthy Keeler PA-C Entered By: Worthy Keeler on 12/07/2017 12:32:07 Eduardo [name], Eduardo [name] Kitchen ([id number]) -------------------------------------------------------------------------------- Physical Exam Details Patient Name: BREYER, TEJERA M. Date of Service: 12/06/2017 3:45 PM Medical Record Number: [id number] Patient Account Number: [id number] Date of Birth/Sex: 1931/03/07 (84 y.o. M) Treating RN: [name] Primary Care Provider: [name] Other Clinician: Referring Provider: [name] Treating Provider/Extender: Melburn Hake, Darreld Hoffer Weeks in Treatment: 4 Constitutional Well-nourished and well-hydrated in no acute distress. Respiratory normal breathing without difficulty. clear to auscultation bilaterally. Cardiovascular regular rate and rhythm with normal S1, S2. 1+ pitting edema of the bilateral lower extremities. Psychiatric this patient is able to make decisions and demonstrates good insight into disease process. Alert and Oriented x 3. pleasant and cooperative. Notes At this time I do believe that the patient's wound is showing signs of excellent improvement and I think he will do even better with more appropriate compression therapy. I do believe we can switch to three layer compression wrap as this will be  of greater benefit and based on his arterial studies it's also an appropriate measure. Electronic Signature(s) Signed: 12/07/2017 12:34:34 PM By: Worthy Keeler PA-C Entered By: Worthy Keeler on 12/07/2017 12:32:47 Eduardo [name], Eduardo [name] ([id number]) -------------------------------------------------------------------------------- Physician Orders Details Patient Name: Eduardo [name]. Date of Service: 12/06/2017 3:45 PM Medical Record Number: [id number] Patient Account Number: [id number] Date  of Birth/Sex: May 08, 1931 (84 y.o. M) Treating RN: [name] Primary Care Provider: [name] Other Clinician: Referring Provider: [name] Treating Provider/Extender: Melburn Hake, Daria Mcmeekin Weeks in Treatment: 4 Verbal / Phone Orders: No Diagnosis Coding ICD-10 Coding Code Description [phone] Non-pressure chronic ulcer of other part of right lower leg with fat layer exposed I48.0 Paroxysmal atrial fibrillation Z79.01 Long term (current) use of anticoagulants J44.9 Chronic obstructive pulmonary disease, unspecified I25.10 Atherosclerotic heart disease of native coronary artery without angina pectoris Wound Cleansing Wound #1 Right Lower Leg o Clean wound with Normal Saline. o May shower with protection. - DO NOT GET WRAP WET Anesthetic (add to Medication List) Wound #1 Right Lower Leg o Topical Lidocaine 4% cream applied to wound bed prior to debridement (In Clinic Only). Primary Wound Dressing Wound #1 Right Lower Leg o Silver Collagen Secondary Dressing Wound #1 Right Lower Leg o ABD pad Dressing Change Frequency Wound #1 Right Lower Leg o Change dressing every week Follow-up Appointments Wound #1 Right Lower Leg o Return Appointment in 1 week. - Tuesday 12/10/17 Edema Control Wound #1 Right Lower Leg o 3 Layer Compression System - Right Lower Extremity Additional Orders / Instructions Wound #1 Right Lower Leg Eduardo [name], [name] ([id number]) o  Vitamin A; Vitamin C, Zinc - Please add a multivitamin that has 100% daily value of vitamin A, vitamin C and zinc supplements o Increase protein intake. Electronic Signature(s) Signed: 12/06/2017 4:59:39 PM By: [name] Signed: 12/07/2017 12:34:34 PM By: Worthy Keeler PA-C Entered By: [name] on 12/06/2017 16:18:56 Eduardo [name], [name] ([id number]) -------------------------------------------------------------------------------- Problem List Details Patient Name: HOSEY, BURMESTER. Date of Service: 12/06/2017 3:45 PM Medical Record Number: [id number] Patient Account Number: [id number] Date of Birth/Sex: August 21, 1930 (84 y.o. M) Treating RN: [name] Primary Care Provider: [name] Other Clinician: Referring Provider: [name] Treating Provider/Extender: Melburn Hake, Sheylin Scharnhorst Weeks in Treatment: 4 Active Problems ICD-10 Evaluated Encounter Code Description Active Date Today Diagnosis [phone] Non-pressure chronic ulcer of other part of right lower leg 11/08/2017 No Yes with fat layer exposed I48.0 Paroxysmal atrial fibrillation 11/08/2017 No Yes Z79.01 Long term (current) use of anticoagulants 11/08/2017 No Yes J44.9 Chronic obstructive pulmonary disease, unspecified 11/08/2017 No Yes I25.10 Atherosclerotic heart disease of native coronary artery 11/08/2017 No Yes without angina pectoris Inactive Problems Resolved Problems Electronic Signature(s) Signed: 12/07/2017 12:34:34 PM By: Worthy Keeler PA-C Entered By: Worthy Keeler on 12/06/2017 15:50:44 Eduardo [name], [name] ([id number]) -------------------------------------------------------------------------------- Progress Note Details Patient Name: Eduardo [name]. Date of Service: 12/06/2017 3:45 PM Medical Record Number: [id number] Patient Account Number: [id number] Date of Birth/Sex: [birth year] (84 y.o. M) Treating RN: [name] Primary Care Provider: [name] Other Clinician: Referring  Provider: [name] Treating Provider/Extender: Melburn Hake, Skylarr Liz Weeks in Treatment: 4 Subjective Chief Complaint Information obtained from Patient Right lower leg ulcer due to trauma History of Present Illness (HPI) 11/08/17 on evaluation today patient presents initially concerning a right dramatic lower extremity ulcer/skin tear which occurred three weeks ago. He states that he initially was seen in urgent care where the area was sutured closed. The sutures stayed in for two weeks he tells me. Subsequently he was seen at Dr. Alla German office where the sutures were removed and Steri-Strips were placed over the next five days. Subsequently these were also removed and unfortunately at this time it was noted that the patient had a dehisced larger wound that required further attention. The patient and his wife actually requested referral to the wound care center  in [place]. [name] made the referral to [place]. Subsequently the patient does have a history of atrial fibrillation, cardiovascular disease, COPD, stroke, and is on long-term Eliquis. There's no history of diabetes that he states. Subsequently he has no other major medical problems currently. No fevers, chills, nausea, or vomiting noted at this time. 11/14/17-He is seen in follow-up evaluation for right posterior lower extremity ulcer. There is improvement with granulation tissue present; debridement. We will continue with same treatment plan he'll follow-up next week. The vascular office did call to establish an appointment, but he forgot that we had referred him and did not make an appointment. He was encouraged to contact the office to schedule an appointment for evaluation. 11/22/17 on evaluation today patient actually appears to be doing well in regard to his ulcer. This has cleaned up very nicely in my pinion I do believe the Iodoflex has been of benefit for him. He actually does have his arterial study which is scheduled for 11/27/17. In  general he's having no significant discomfort mainly just with cleansing of the wound but the dressings themselves don't seem to be causing him problems. 11/29/17 on evaluation today patient appears to be doing very well in regard to his lower extremity ulcer. With that being said he unfortunately prior to coming in for evaluation today did have a trip over a world where he failed hurting his back and striking his head. With that being said he has not had any altered mental status since that time and states that he's not really any more dizzy and there does not appear to be any residual effect from this. He also does not have a significant headache. With that being said this is something I did check out for him today briefly at least since he was in our office. I do think that he needs to look out for any signs or symptoms of anything worsening such as nausea, vomiting, fever, chills, confusion, or severe headaches. He has no visual disturbance at this point. 12/06/17 on evaluation today patient appears to be doing excellent in regard to his lower extremity ulcer. This is starting to really fill in which is good news as well. We do have the results of the arterial study unfortunately they did not perform the TBI portion of the examination and his ABI's were noncompressible. With that being said they did state that he had triphasic blood flow throughout the bilateral lower extremities and there was apparently no evidence of arterial disease on the arterial ultrasound. Overall this is good news we will be able to initiate stronger compression therapy which should help to allow this wound to heal even faster as the patient still continues to have some pitting edema. Patient History Information obtained from Patient. Family History Cancer - Mother,Child, Heart Disease - Father, Lung Disease - Child, Stroke - Maternal Grandparents, No family history of Diabetes, Hereditary Spherocytosis, Hypertension,  Kidney Disease, Seizures, Thyroid Problems, Eduardo [name], [name] ([id number]) Tuberculosis. Social History Former smoker - 68 years, Marital Status - Married, Alcohol Use - Never, Caffeine Use - Daily. Review of Systems (ROS) Constitutional Symptoms (General Health) Denies complaints or symptoms of Chills. Respiratory The patient has no complaints or symptoms. Cardiovascular Complains or has symptoms of LE edema. Psychiatric The patient has no complaints or symptoms. Objective Constitutional Well-nourished and well-hydrated in no acute distress. Vitals Time Taken: 3:35 PM, Height: 62 in, Weight: 153.7 lbs, BMI: 28.1, Temperature: 98.7 F, Blood Pressure: 138/70 mmHg. Respiratory normal breathing without difficulty. clear  to auscultation bilaterally. Cardiovascular regular rate and rhythm with normal S1, S2. 1+ pitting edema of the bilateral lower extremities. Psychiatric this patient is able to make decisions and demonstrates good insight into disease process. Alert and Oriented x 3. pleasant and cooperative. General Notes: At this time I do believe that the patient's wound is showing signs of excellent improvement and I think he will do even better with more appropriate compression therapy. I do believe we can switch to three layer compression wrap as this will be of greater benefit and based on his arterial studies it's also an appropriate measure. Integumentary (Hair, Skin) Wound #1 status is Open. Original cause of wound was Trauma. The wound is located on the Right Lower Leg. The wound measures 2.3cm length x 1.5cm width x 0.3cm depth; 2.71cm^2 area and 0.813cm^3 volume. There is Fat Layer (Subcutaneous Tissue) Exposed exposed. There is no tunneling or undermining noted. There is a small amount of serous drainage noted. The wound margin is thickened. There is small (1-33%) pink granulation within the wound bed. There is a large (67-100%) amount of necrotic tissue within the  wound bed including Eschar and Adherent Slough. The periwound skin appearance exhibited: Induration, Erythema. The periwound skin appearance did not exhibit: Callus, Crepitus, Excoriation, Rash, Scarring, Dry/Scaly, Maceration, Atrophie Blanche, Cyanosis, Ecchymosis, Hemosiderin Staining, Mottled, Pallor, Rubor. The surrounding wound skin color is noted with erythema which is circumferential. Periwound temperature was noted as No Abnormality. The periwound has tenderness on palpation. Matulich, Pryor M. ([id number]) Assessment Active Problems ICD-10 Non-pressure chronic ulcer of other part of right lower leg with fat layer exposed Paroxysmal atrial fibrillation Long term (current) use of anticoagulants Chronic obstructive pulmonary disease, unspecified Atherosclerotic heart disease of native coronary artery without angina pectoris Procedures Wound #1 Pre-procedure diagnosis of Wound #1 is a Trauma, Other located on the Right Lower Leg . There was a Excisional Skin/Subcutaneous Tissue Debridement with a total area of 3.45 sq cm performed by STONE III, Bulah Lurie E., PA-C. With the following instrument(s): Curette to remove Viable and Non-Viable tissue/material. Material removed includes Subcutaneous Tissue and Slough and after achieving pain control using Lidocaine 4% Topical Solution. No specimens were taken. A time out was conducted at 16:14, prior to the start of the procedure. A Minimum amount of bleeding was controlled with Pressure. The procedure was tolerated well with a pain level of 0 throughout and a pain level of 0 following the procedure. Patient s Level of Consciousness post procedure was recorded as Awake and Alert. Post Debridement Measurements: 2.3cm length x 1.5cm width x 0.4cm depth; 1.084cm^3 volume. Character of Wound/Ulcer Post Debridement is improved. Post procedure Diagnosis Wound #1: Same as Pre-Procedure Plan Wound Cleansing: Wound #1 Right Lower Leg: Clean wound with  Normal Saline. May shower with protection. - DO NOT GET WRAP WET Anesthetic (add to Medication List): Wound #1 Right Lower Leg: Topical Lidocaine 4% cream applied to wound bed prior to debridement (In Clinic Only). Primary Wound Dressing: Wound #1 Right Lower Leg: Silver Collagen Secondary Dressing: Wound #1 Right Lower Leg: ABD pad Dressing Change Frequency: Wound #1 Right Lower Leg: Change dressing every week Follow-up Appointments: Wound #1 Right Lower Leg: Return Appointment in 1 week. - Tuesday 12/10/17 Eduardo [name], Eduardo [name] ([id number]) Edema Control: Wound #1 Right Lower Leg: 3 Layer Compression System - Right Lower Extremity Additional Orders / Instructions: Wound #1 Right Lower Leg: Vitamin A; Vitamin C, Zinc - Please add a multivitamin that has 100% daily value of vitamin A, vitamin  C and zinc supplements Increase protein intake. At this time I do believe that the patient's wound is showing signs of excellent improvement and I think he will do even better with more appropriate compression therapy. I do believe we can switch to three layer compression wrap as this will be of greater benefit and based on his arterial studies it's also an appropriate measure. Electronic Signature(s) Signed: 12/07/2017 12:34:34 PM By: Worthy Keeler PA-C Entered By: Worthy Keeler on 12/07/2017 12:33:10 Eduardo [name], Eduardo [name] ([id number]) -------------------------------------------------------------------------------- ROS/PFSH Details Patient Name: Eduardo [name]. Date of Service: 12/06/2017 3:45 PM Medical Record Number: [id number] Patient Account Number: [id number] Date of Birth/Sex: 11/15/1930 (84 y.o. M) Treating RN: [name] Primary Care Provider: [name] Other Clinician: Referring Provider: [name] Treating Provider/Extender: Melburn Hake, Zakia Sainato Weeks in Treatment: 4 Information Obtained From Patient Wound History Do you currently have one or more open woundso  Yes How many open wounds do you currently haveo 1 Approximately how long have you had your woundso 3.5 weeks How have you been treating your wound(s) until nowo wet to dry Has your wound(s) ever healed and then re-openedo No Have you had any lab work done in the past montho Yes Have you tested positive for an antibiotic resistant organism (MRSA, VRE)o No Have you tested positive for osteomyelitis (bone infection)o No Have you had any tests for circulation on your legso No Constitutional Symptoms (General Health) Complaints and Symptoms: Negative for: Chills Cardiovascular Complaints and Symptoms: Positive for: LE edema Medical History: Positive for: Arrhythmia - A-fib; Congestive Heart Failure; Coronary Artery Disease; Myocardial Infarction - 2013 Negative for: Angina; Deep Vein Thrombosis; Hypertension; Hypotension; Peripheral Arterial Disease; Peripheral Venous Disease; Phlebitis; Vasculitis Eyes Medical History: Positive for: Glaucoma - replaced Negative for: Cataracts; Optic Neuritis Ear/Nose/Mouth/Throat Medical History: Negative for: Chronic sinus problems/congestion; Middle ear problems Hematologic/Lymphatic Medical History: Negative for: Anemia; Hemophilia; Human Immunodeficiency Virus; Lymphedema; Sickle Cell Disease Respiratory Complaints and Symptoms: No Complaints or Symptoms Medical History: Eduardo [name], BRUTUS. ([id number]) Positive for: Chronic Obstructive Pulmonary Disease (COPD) Negative for: Aspiration; Asthma; Pneumothorax; Sleep Apnea; Tuberculosis Gastrointestinal Medical History: Negative for: Cirrhosis ; Colitis; Crohnos; Hepatitis A; Hepatitis B; Hepatitis C Endocrine Medical History: Negative for: Type I Diabetes; Type II Diabetes Genitourinary Medical History: Negative for: End Stage Renal Disease Immunological Medical History: Negative for: Lupus Erythematosus; Raynaudos; Scleroderma Integumentary (Skin) Medical History: Negative for:  History of Burn; History of pressure wounds Musculoskeletal Medical History: Negative for: Gout; Rheumatoid Arthritis; Osteoarthritis; Osteomyelitis Neurologic Medical History: Positive for: Neuropathy - Feet and legs Negative for: Dementia; Quadriplegia; Paraplegia; Seizure Disorder Oncologic Medical History: Negative for: Received Chemotherapy; Received Radiation Psychiatric Complaints and Symptoms: No Complaints or Symptoms Medical History: Negative for: Anorexia/bulimia; Confinement Anxiety HBO Extended History Items Eyes: Glaucoma Immunizations Pneumococcal Vaccine: Received Pneumococcal Vaccination: Yes CARDEN, TEEL ([id number]) Implantable Devices Family and Social History Cancer: Yes - Mother,Child; Diabetes: No; Heart Disease: Yes - Father; Hereditary Spherocytosis: No; Hypertension: No; Kidney Disease: No; Lung Disease: Yes - Child; Seizures: No; Stroke: Yes - Maternal Grandparents; Thyroid Problems: No; Tuberculosis: No; Former smoker - 5 years; Marital Status - Married; Alcohol Use: Never; Caffeine Use: Daily; Advanced Directives: Yes (Not Provided); Patient does not want information on Advanced Directives; Living Will: Yes (Copy provided); Medical Power of Attorney: Yes (Not Provided) Physician Affirmation I have reviewed and agree with the above information. Electronic Signature(s) Signed: 12/07/2017 12:34:34 PM By: Worthy Keeler PA-C Signed: 12/09/2017 5:27:38 PM By: [name] Entered By: Joaquim Lai  IIIMargarita Grizzle on 12/07/2017 12:32:28 Pensinger, Damian [name] Kitchen ([id number]) -------------------------------------------------------------------------------- [place] Details Patient Name: ABDELRAHMAN, NAIR. Date of Service: 12/06/2017 Medical Record Number: [id number] Patient Account Number: [id number] Date of Birth/Sex: 1930/08/03 (84 y.o. M) Treating RN: [name] Primary Care Provider: [name] Other Clinician: Referring Provider: [name] Treating Provider/Extender: Melburn Hake, Thien Berka Weeks in Treatment: 4 Diagnosis Coding ICD-10 Codes Code Description [phone] Non-pressure chronic ulcer of other part of right lower leg with fat layer exposed I48.0 Paroxysmal atrial fibrillation Z79.01 Long term (current) use of anticoagulants J44.9 Chronic obstructive pulmonary disease, unspecified I25.10 Atherosclerotic heart disease of native coronary artery without angina pectoris Facility Procedures CPT4 Code Description: [id number] 11042 - DEB SUBQ TISSUE 20 SQ CM/< ICD-10 Diagnosis Description P12.258 Non-pressure chronic ulcer of other part of right lower leg wi Modifier: th fat layer expo Quantity: 1 sed Physician Procedures CPT4 Code Description: [id number] 11042 - WC PHYS SUBQ TISS 20 SQ CM ICD-10 Diagnosis Description F12.527 Non-pressure chronic ulcer of other part of right lower leg wi Modifier: th fat layer expo Quantity: 1 sed Electronic Signature(s) Signed: 12/07/2017 12:34:34 PM By: Worthy Keeler PA-C Entered By: Worthy Keeler on 12/07/2017 12:33:28

## 2017-12-10 NOTE — Telephone Encounter (Signed)
Spoke to pt. Made appt for 1230 tomorrow

## 2017-12-10 NOTE — Telephone Encounter (Signed)
Okay to add him on tomorrow morning (either 12:15 same day or add 12:30)

## 2017-12-11 ENCOUNTER — Encounter: Payer: Self-pay | Admitting: Internal Medicine

## 2017-12-11 ENCOUNTER — Ambulatory Visit (INDEPENDENT_AMBULATORY_CARE_PROVIDER_SITE_OTHER): Payer: PPO | Admitting: Internal Medicine

## 2017-12-11 VITALS — BP 124/70 | HR 52 | Temp 97.3°F | Resp 18 | Ht 62.0 in | Wt 155.0 lb

## 2017-12-11 DIAGNOSIS — J439 Emphysema, unspecified: Secondary | ICD-10-CM | POA: Diagnosis not present

## 2017-12-11 DIAGNOSIS — F1190 Opioid use, unspecified, uncomplicated: Secondary | ICD-10-CM | POA: Diagnosis not present

## 2017-12-11 DIAGNOSIS — S335XXA Sprain of ligaments of lumbar spine, initial encounter: Secondary | ICD-10-CM | POA: Diagnosis not present

## 2017-12-11 MED ORDER — ALPRAZOLAM 0.25 MG PO TABS
0.2500 mg | ORAL_TABLET | Freq: Two times a day (BID) | ORAL | 0 refills | Status: AC | PRN
Start: 2017-12-11 — End: ?

## 2017-12-11 MED ORDER — METHYLPREDNISOLONE ACETATE 40 MG/ML IJ SUSP
40.0000 mg | Freq: Once | INTRAMUSCULAR | Status: AC
  Administered 2017-12-11: 40 mg via INTRAMUSCULAR

## 2017-12-11 MED ORDER — HYDROCODONE-ACETAMINOPHEN 5-325 MG PO TABS: 1.0000 | ORAL_TABLET | ORAL | 0 refills | Status: DC | PRN

## 2017-12-11 NOTE — Assessment & Plan Note (Addendum)
Has had mild exacerbation Not bad now Suspect that this was a combination of pain, anxiety and the COPD last night Will Rx some alprazolam and deal with the pain Wife probably needs some help with his care at home Will give depomedrol here---just in case

## 2017-12-11 NOTE — Progress Notes (Signed)
Subjective:    Patient ID: GILMORE LIST, male    DOB: 1931-06-08, 84 y.o.   MRN: [id number]  HPI Here with wife for ongoing respiratory symptoms  Has been getting more DOE as the day goes on Started 2 days ago Seen by PA at pulmonary office Started on doxy and prednisone Actually got into a panic trying to bathe----couldn't breathe Didn't use the inhaler--had used it some already Is on prednisone taper-- 40mg  x 2 days, and just started 30mg  daily today  Wheezing is prominent still Wife notes really raspy sound last night  Current Outpatient Medications on File Prior to Visit  Medication Sig Dispense Refill  . acetaminophen (TYLENOL) 650 MG CR tablet Take 1,300 mg by mouth 2 (two) times daily.    Marland Kitchen albuterol (PROAIR HFA) 108 (90 Base) MCG/ACT inhaler Inhale 2 puffs into the lungs every 6 (six) hours as needed for wheezing or shortness of breath. 18 g 0  . albuterol (PROVENTIL) (2.5 MG/3ML) 0.083% nebulizer solution use 1 vial in nebulizer every 4 hours as needed for wheezing or shortness of breath 270 mL 2  . atorvastatin (LIPITOR) 80 MG tablet TAKE ONE TABLET BY MOUTH ONE TIME DAILY  90 tablet 3  . azelastine (ASTELIN) 0.1 % nasal spray Place 2 sprays into both nostrils at bedtime. Use in each nostril as directed    . B Complex-C (SUPER B COMPLEX PO) Take 1 tablet by mouth at bedtime.     . cetirizine (ZYRTEC) 10 MG tablet Take 10 mg by mouth at bedtime.     . cholecalciferol (VITAMIN D) 1000 units tablet Take 1,000 Units by mouth daily.    Marland Kitchen doxycycline (VIBRA-TABS) 100 MG tablet Take 1 tablet (100 mg total) by mouth 2 (two) times daily. 14 tablet 0  . ELIQUIS 5 MG TABS tablet TAKE 1 TABLET BY MOUTH 2 TIMES DAILY 180 tablet 1  . escitalopram (LEXAPRO) 20 MG tablet Take 20 mg by mouth at bedtime.     . fenofibrate 160 MG tablet TAKE 1 TABLET BY MOUTH AT BEDTIME. 90 tablet 3  . furosemide (LASIX) 20 MG tablet TAKE 1 TABLET BY MOUTH DAILY 90 tablet 3  . hydrALAZINE  (APRESOLINE) 10 MG tablet Take 1 tablet (10 mg total) by mouth 3 (three) times daily. 270 tablet 3  . ipratropium (ATROVENT) 0.03 % nasal spray PLACE 2 SPRAYS INTO BOTH NOSTRILS AT BEDTIME. 90 mL 3  . IRON PO Take by mouth.    . lamoTRIgine (LAMICTAL) 150 MG tablet Take 150 mg by mouth at bedtime. Reported on 08/14/2015    . montelukast (SINGULAIR) 10 MG tablet Take 1 tablet (10 mg total) by mouth at bedtime. 30 tablet 11  . Multiple Vitamin (MULTIVITAMIN) tablet Take 1 tablet by mouth at bedtime.     . pantoprazole (PROTONIX) 40 MG tablet TAKE 1 TABLET BY MOUTH DAILY BEFORE BREAKFAST. 90 tablet 3  . predniSONE (DELTASONE) 10 MG tablet 4 tabs for 2 days, then 3 tabs for 2 days, 2 tabs for 2 days, then 1 tab for 2 days, then stop 20 tablet 0  . Tiotropium Bromide-Olodaterol (STIOLTO RESPIMAT) 2.5-2.5 MCG/ACT AERS Inhale 2 puffs into the lungs daily. 12 Inhaler 3  . tizanidine (ZANAFLEX) 2 MG capsule Take 1 capsule (2 mg total) by mouth 3 (three) times daily as needed for muscle spasms. 30 capsule 0  . triamcinolone cream (KENALOG) 0.1 % Apply 1 application topically 2 (two) times daily as needed (for skin).     Marland Kitchen  Turmeric 500 MG TABS Take 2 tablets by mouth.    . verapamil (CALAN-SR) 240 MG CR tablet TAKE 1 TABLET BY MOUTH AT BEDTIME 90 tablet 1   No current facility-administered medications on file prior to visit.     Allergies  Allergen Reactions  . Penicillins Itching    Has patient had a PCN reaction causing immediate rash, facial/tongue/throat swelling, SOB or lightheadedness with hypotension: Yes Has patient had a PCN reaction causing severe rash involving mucus membranes or skin necrosis: No Has patient had a PCN reaction that required hospitalization No Has patient had a PCN reaction occurring within the last 10 years: No If all of the above answers are "NO", then may proceed with Cephalosporin use.   . Sulfonamide Derivatives Other (See Comments)    "it affected my kidneys"  . Tape  Other (See Comments)    Arms black and blue, tears skin.  Please use "paper" tape    Past Medical History:  Diagnosis Date  . Allergy   . Anemia 2012, 08/2015   chronic. Acute due to large hematoma 2013. 2017: due to gastric AVM bleeding.   . Anxiety   . Atrial fibrillation ([place]) 2012   a.  CHA2DS2VASc = 6-->eliquis;  b. 12/2015 Echo: EF 60-65%, no rwma, LVH, mild AS/MS/MR, sev dil LA, mild TR, PASP 63mmHg.  Marland Kitchen CAD (coronary artery disease)    a. 06/2010 Cath: LM nl, LAD 50p/m, LCX 69m, RCA 50p/m -->catheter dissection but good flow, 70d, RPDA 50-70;  b. 06/2010 Relook cath due to bradycardia and inf ST elev: RCA dissection @ ostium extending into coronary cusp and prox RCA, Ao dissection-->less staining than previously-->Med Rx.  Marland Kitchen COPD (chronic obstructive pulmonary disease) ([place]) 2013   exertional dyspnea  . Gastric angiodysplasia with hemorrhage 08/2015  . Hx of colonic polyps 2001, 2011   adenomatous 2001. HP 2001, 2011.  Marland Kitchen Hyperlipidemia   . Myocardial infarction ([place]) 06/2010.  12/2011.   "twice; 1 day apart; during/after cath". NQMI in setting severe anemia 2013.   Marland Kitchen Neuropathy 2014   in feet, likely due to spinal stenosis, DDD, HNP  . Osteoarthritis 2002   spinal stenosis, spondylolisthesis, disc protrusion multilevel in lumbar spine  . Stroke ([place]) 2016  . Upper GI bleed 08/2015   EGD: 2 small gastric AVMs, 1 actively bleeding.  Both treated with APC ablation, clipping of the bleeder    Past Surgical History:  Procedure Laterality Date  . APPENDECTOMY    . CARDIAC CATHETERIZATION  2012   50% LAD and luminal irreg in RCA, 1/12  Post cath MI from damage  . CARPAL TUNNEL RELEASE     left  . CARPAL TUNNEL RELEASE  10/11/11   Dr Rush Barer  . CATARACT EXTRACTION W/ INTRAOCULAR LENS IMPLANT  2/13   Dr [name]  . COLONOSCOPY  2001, 02/2010   Dr Deatra Ina.   . ESOPHAGOGASTRODUODENOSCOPY N/A 08/15/2015   [name] MD; 2 small gastric AVMs, 1 actively bleeding.  Both  treated with APC ablation, clipping of the bleeder  . FOOT FRACTURE SURGERY     right heel repair  . FRACTURE SURGERY    . KNEE ARTHROSCOPY     left, right knee 10/2009  . MASTOIDECTOMY     x3 on right  . NASAL SEPTUM SURGERY     nasal septal repair  . PENILE PROSTHESIS  REMOVAL    . PENILE PROSTHESIS IMPLANT     x 2--got infection after 2nd and had to remove  .  PILONIDAL CYST / SINUS EXCISION    . SHOULDER ARTHROSCOPY     right  . TONSILLECTOMY AND ADENOIDECTOMY      Family History  Problem Relation Age of Onset  . Cancer Mother        ? leukemia  . Heart attack Father   . Stroke Maternal Grandmother   . Diabetes Neg Hx   . Hypertension Neg Hx     Social History   Socioeconomic History  . Marital status: Married    Spouse name: Not on file  . Number of children: 4  . Years of education: some college  . Highest education level: Not on file  Occupational History  . Occupation: Retired Chief Strategy Officer the job trainin)  Social Needs  . Financial resource strain: Not on file  . Food insecurity:    Worry: Not on file    Inability: Not on file  . Transportation needs:    Medical: Not on file    Non-medical: Not on file  Tobacco Use  . Smoking status: Former Smoker    Packs/day: 2.50    Years: 30.00    Pack years: 75.00    Types: Cigarettes    Last attempt to quit: 06/11/1978    Years since quitting: 39.5  . Smokeless tobacco: Never Used  Substance and Sexual Activity  . Alcohol use: No    Comment: QUIT [id number] alcoholic  . Drug use: No  . Sexual activity: Not Currently  Lifestyle  . Physical activity:    Days per week: Not on file    Minutes per session: Not on file  . Stress: Not on file  Relationships  . Social connections:    Talks on phone: Not on file    Gets together: Not on file    Attends religious service: Not on file    Active member of club or organization: Not on file    Attends meetings of clubs or organizations: Not on file     Relationship status: Not on file  . Intimate partner violence:    Fear of current or ex partner: Not on file    Emotionally abused: Not on file    Physically abused: Not on file    Forced sexual activity: Not on file  Other Topics Concern  . Not on file  Social History Narrative   Grew up in [place], [place].  Lives in [place].   Married---2nd   2 children ([place], [place]  and near [place])--2 stepchildren   Former Smoker--quit in 1980   Alcohol use-no. Quit in 1980--alcoholic      Has living will.   Has designated wife, then step daughter [name] have health care POA.   Would accept resuscitation attempts.   No feeding tube if cognitively unaware.   Review of Systems Anxiety is worse---probably part of the severity of last night's spell Back pain is still bad--she gave him left over hydrocodone and it really helped    Objective:   Physical Exam  Constitutional: He appears well-developed. No distress.  Neck: No thyromegaly present.  Respiratory: Effort normal. No respiratory distress. He has no rales.  Slight expiratory wheeze but not tight  Lymphadenopathy:    He has no cervical adenopathy.  Psychiatric:  Mildly anxious---as is wife (with his issues)           Assessment & Plan:

## 2017-12-11 NOTE — Assessment & Plan Note (Signed)
Pain is really severe Waiting to see Dr Sharlet Salina Will give hydrocodone  Check UDS CSRS shows no controlled substances

## 2017-12-11 NOTE — Progress Notes (Signed)
Eduardo [name] ([id number]) Visit Report for 12/10/2017 Arrival Information Details Patient Name: Eduardo [name], Eduardo [name]. Date of Service: 12/10/2017 2:30 PM Medical Record Number: [id number] Patient Account Number: [id number] Date of Birth/Sex: 30-Oct-1930 (84 y.o. M) Treating RN: [name] Primary Care Valecia Beske: [name] Other Clinician: Referring Carrick Rijos: [name] Treating Akita Maxim/Extender: Melburn Hake, HOYT Weeks in Treatment: 4 Visit Information History Since Last Visit Added or deleted any medications: No Patient Arrived: [place] Any new allergies or adverse reactions: No Arrival Time: 14:52 Had a fall or experienced change in No Accompanied By: spouse activities of daily living that may affect Transfer Assistance: None risk of falls: Patient Identification Verified: Yes Signs or symptoms of abuse/neglect since last visito No Secondary Verification Process Yes Hospitalized since last visit: No Completed: Implantable device outside of the clinic excluding No Patient Has Alerts: Yes cellular tissue based products placed in the center Patient Alerts: Patient on Blood since last visit: Thinner Has Dressing in Place as Prescribed: Yes Eliquis Has Compression in Place as Prescribed: Yes 11/08/17 ABI >220 Biateral Pain Present Now: No Electronic Signature(s) Signed: 12/10/2017 4:10:25 PM By: [name] Entered By: [name] on 12/10/2017 14:56:30 Mccrone, Lewie M. ([id number]) -------------------------------------------------------------------------------- Lower Extremity Assessment Details Patient Name: Eduardo [name]. Date of Service: 12/10/2017 2:30 PM Medical Record Number: [id number] Patient Account Number: [id number] Date of Birth/Sex: Jun 28, 1930 (84 y.o. M) Treating RN: [name] Primary Care Rhyland Hinderliter: [name] Other Clinician: Referring Elmor Kost: [name] Treating Guilherme Schwenke/Extender: Melburn Hake, HOYT Weeks in  Treatment: 4 Edema Assessment Assessed: [name]: No] [Right: No] [Left: Edema] [Right: :] Calf Left: Right: Point of Measurement: 30 cm From Medial Instep cm 28.5 cm Ankle Left: Right: Point of Measurement: 10 cm From Medial Instep cm 20.7 cm Vascular Assessment Pulses: Dorsalis Pedis Palpable: [Right:Yes] Posterior Tibial Extremity colors, hair growth, and conditions: Extremity Color: [Right:Hyperpigmented] Hair Growth on Extremity: [Right:Yes] Temperature of Extremity: [Right:Warm] Capillary Refill: [Right:< 3 seconds] Toe Nail Assessment Left: Right: Thick: Yes Discolored: No Deformed: No Improper Length and Hygiene: No Electronic Signature(s) Signed: 12/10/2017 4:10:25 PM By: [name] Entered By: [name] on 12/10/2017 14:57:50 Berzins, Rondall M. ([id number]) -------------------------------------------------------------------------------- Multi Wound Chart Details Patient Name: Eduardo [name]. Date of Service: 12/10/2017 2:30 PM Medical Record Number: [id number] Patient Account Number: [id number] Date of Birth/Sex: 1931-04-17 (84 y.o. M) Treating RN: Ahmed Prima Primary Care Sherolyn Trettin: [name] Other Clinician: Referring Aluna Whiston: [name] Treating Levina Boyack/Extender: Melburn Hake, HOYT Weeks in Treatment: 4 Vital Signs Height(in): 62 Pulse(bpm): 89 Weight(lbs): 153.7 Blood Pressure(mmHg): 143/67 Body Mass Index(BMI): 28 Temperature(F): 98.7 Respiratory Rate 22 (breaths/min): Photos: [N/A:N/A] Wound Location: Right Lower Leg N/A N/A Wounding Event: Trauma N/A N/A Primary Etiology: Trauma, Other N/A N/A Comorbid History: Glaucoma, Chronic N/A N/A Obstructive Pulmonary Disease (COPD), Arrhythmia, Congestive Heart Failure, Coronary Artery Disease, Myocardial Infarction, Neuropathy Date Acquired: 10/15/2017 N/A N/A Weeks of Treatment: 4 N/A N/A Wound Status: Open N/A N/A Measurements L x W x D 2.1x1x0.2 N/A N/A (cm) Area  (cm) : 1.649 N/A N/A Volume (cm) : 0.33 N/A N/A % Reduction in Area: 67.20% N/A N/A % Reduction in Volume: 83.60% N/A N/A Classification: Full Thickness Without N/A N/A Exposed Support Structures Exudate Amount: Small N/A N/A Exudate Type: Serous N/A N/A Exudate Color: amber N/A N/A Wound Margin: Thickened N/A N/A Granulation Amount: Medium (34-66%) N/A N/A Granulation Quality: Pink N/A N/A Necrotic Amount: Medium (34-66%) N/A N/A Necrotic Tissue: Eschar, Adherent [place] ([id number]) Exposed Structures: Fat Layer (Subcutaneous N/A  N/A Tissue) Exposed: Yes Fascia: No Tendon: No Muscle: No Joint: No Bone: No Epithelialization: None N/A N/A Periwound Skin Texture: Induration: Yes N/A N/A Excoriation: No Callus: No Crepitus: No Rash: No Scarring: No Periwound Skin Moisture: Maceration: No N/A N/A Dry/Scaly: No Periwound Skin Color: Erythema: Yes N/A N/A Atrophie Blanche: No Cyanosis: No Ecchymosis: No Hemosiderin Staining: No Mottled: No Pallor: No Rubor: No Erythema Location: Circumferential N/A N/A Temperature: No Abnormality N/A N/A Tenderness on Palpation: Yes N/A N/A Wound Preparation: Ulcer Cleansing: N/A N/A Rinsed/Irrigated with Saline, Other: soap and water Topical Anesthetic Applied: Other: lidocaine 4% Treatment Notes Electronic Signature(s) Signed: 12/10/2017 4:10:39 PM By: Alric Quan Entered By: Alric Quan on 12/10/2017 15:15:34 Seltzer, Shmiel M. ([id number]) -------------------------------------------------------------------------------- Multi-Disciplinary Care Plan Details Patient Name: Eduardo [name]. Date of Service: 12/10/2017 2:30 PM Medical Record Number: [id number] Patient Account Number: [id number] Date of Birth/Sex: 06/28/1930 (84 y.o. M) Treating RN: Ahmed Prima Primary Care Orell Hurtado: [name] Other Clinician: Referring Meghan Warshawsky: [name] Treating Chaden Doom/Extender:  Melburn Hake, HOYT Weeks in Treatment: 4 Active Inactive ` Abuse / Safety / Falls / Self Care Management Nursing Diagnoses: Potential for falls Goals: Patient will remain injury free related to falls Date Initiated: 11/08/2017 Target Resolution Date: 01/17/2018 Goal Status: Active Interventions: Assess fall risk on admission and as needed Notes: ` Orientation to the Wound Care Program Nursing Diagnoses: Knowledge deficit related to the wound healing center program Goals: Patient/caregiver will verbalize understanding of the [facility] Program Date Initiated: 11/08/2017 Target Resolution Date: 01/17/2018 Goal Status: Active Interventions: Provide education on orientation to the wound center Notes: ` Wound/Skin Impairment Nursing Diagnoses: Impaired tissue integrity Goals: Ulcer/skin breakdown will heal within 14 weeks Date Initiated: 11/08/2017 Target Resolution Date: 01/17/2018 Goal Status: Active Interventions: SILAS, MUFF ([id number]) Assess patient/caregiver ability to obtain necessary supplies Assess patient/caregiver ability to perform ulcer/skin care regimen upon admission and as needed Assess ulceration(s) every visit Notes: Electronic Signature(s) Signed: 12/10/2017 4:10:39 PM By: Alric Quan Entered By: Alric Quan on 12/10/2017 15:15:20 Pagliarulo, Tomothy M. ([id number]) -------------------------------------------------------------------------------- Pain Assessment Details Patient Name: Eduardo [name]. Date of Service: 12/10/2017 2:30 PM Medical Record Number: [id number] Patient Account Number: [id number] Date of Birth/Sex: 1931/01/24 (84 y.o. M) Treating RN: [name] Primary Care Dayle Mcnerney: [name] Other Clinician: Referring Lennix Kneisel: [name] Treating Gae Bihl/Extender: Melburn Hake, HOYT Weeks in Treatment: 4 Active Problems Location of Pain Severity and Description of Pain Patient Has Paino No Site  Locations Pain Management and Medication Current Pain Management: Electronic Signature(s) Signed: 12/10/2017 4:10:25 PM By: [name] Entered By: [name] on 12/10/2017 14:56:39 Bilyeu, Kurtiss M. ([id number]) -------------------------------------------------------------------------------- Wound Assessment Details Patient Name: Clayton Lefort M. Date of Service: 12/10/2017 2:30 PM Medical Record Number: [id number] Patient Account Number: [id number] Date of Birth/Sex: [birth year] (84 y.o. M) Treating RN: [name] Primary Care Bricia Taher: [name] Other Clinician: Referring Bernadean Saling: [name] Treating Krystyl Cannell/Extender: Melburn Hake, HOYT Weeks in Treatment: 4 Wound Status Wound Number: 1 Primary Trauma, Other Etiology: Wound Location: Right Lower Leg Wound Open Wounding Event: Trauma Status: Date Acquired: 10/15/2017 Comorbid Glaucoma, Chronic Obstructive Pulmonary Weeks Of Treatment: 4 History: Disease (COPD), Arrhythmia, Congestive Heart Clustered Wound: No Failure, Coronary Artery Disease, Myocardial Infarction, Neuropathy Photos Photo Uploaded By: [name] on 12/10/2017 15:10:59 Wound Measurements Length: (cm) 2.1 Width: (cm) 1 Depth: (cm) 0.2 Area: (cm) 1.649 Volume: (cm) 0.33 % Reduction in Area: 67.2% % Reduction in Volume: 83.6% Epithelialization: None Tunneling: No Undermining: No Wound Description Full Thickness Without Exposed Support  Classification: Structures Wound Margin: Thickened Exudate Small Amount: Exudate Type: Serous Exudate Color: amber Foul Odor After Cleansing: No Slough/Fibrino Yes Wound Bed Granulation Amount: Medium (34-66%) Exposed Structure Granulation Quality: Pink Fascia Exposed: No Necrotic Amount: Medium (34-66%) Fat Layer (Subcutaneous Tissue) Exposed: Yes Necrotic Quality: Eschar, Adherent Slough Tendon Exposed: No Muscle Exposed: No Joint Exposed: No Restivo, Fain M.  ([id number]) Bone Exposed: No Periwound Skin Texture Texture Color No Abnormalities Noted: No No Abnormalities Noted: No Callus: No Atrophie Blanche: No Crepitus: No Cyanosis: No Excoriation: No Ecchymosis: No Induration: Yes Erythema: Yes Rash: No Erythema Location: Circumferential Scarring: No Hemosiderin Staining: No Mottled: No Moisture Pallor: No No Abnormalities Noted: No Rubor: No Dry / Scaly: No Maceration: No Temperature / Pain Temperature: No Abnormality Tenderness on Palpation: Yes Wound Preparation Ulcer Cleansing: Rinsed/Irrigated with Saline, Other: soap and water, Topical Anesthetic Applied: Other: lidocaine 4%, Electronic Signature(s) Signed: 12/10/2017 4:10:25 PM By: [name] Entered By: [name] on 12/10/2017 15:04:00 Graber, Emmett M. ([id number]) -------------------------------------------------------------------------------- [place] Details Patient Name: Eduardo [name]. Date of Service: 12/10/2017 2:30 PM Medical Record Number: [id number] Patient Account Number: [id number] Date of Birth/Sex: 1930/06/14 (84 y.o. M) Treating RN: [name] Primary Care Peightyn Roberson: [name] Other Clinician: Referring Jaileigh Weimer: [name] Treating Aseem Sessums/Extender: Melburn Hake, HOYT Weeks in Treatment: 4 Vital Signs Time Taken: 15:04 Temperature (F): 98.7 Height (in): 62 Pulse (bpm): 83 Weight (lbs): 153.7 Respiratory Rate (breaths/min): 22 Body Mass Index (BMI): 28.1 Blood Pressure (mmHg): 143/67 Reference Range: 80 - 120 mg / dl Electronic Signature(s) Signed: 12/10/2017 4:10:25 PM By: [name] Entered By: [name] on 12/10/2017 15:04:59

## 2017-12-11 NOTE — Addendum Note (Signed)
Addended by: Pilar Grammes on: 12/11/2017 02:45 PM   Modules accepted: Orders

## 2017-12-12 NOTE — Progress Notes (Signed)
LAWSON, MAHONE ([id number]) Visit Report for 12/10/2017 Chief Complaint Document Details Patient Name: Eduardo [name], Eduardo [name]. Date of Service: 12/10/2017 2:30 PM Medical Record Number: [id number] Patient Account Number: [id number] Date of Birth/Sex: Aug 12, 1930 (84 y.o. M) Treating RN: Ahmed Prima Primary Care Provider: [name] Other Clinician: Referring Provider: [name] Treating Provider/Extender: Melburn Hake, Shawnice Tilmon Weeks in Treatment: 4 Information Obtained from: Patient Chief Complaint Right lower leg ulcer due to trauma Electronic Signature(s) Signed: 12/11/2017 1:22:53 AM By: Worthy Keeler PA-C Entered By: Worthy Keeler on 12/10/2017 14:47:16 [name], [name] ([id number]) -------------------------------------------------------------------------------- Debridement Details Patient Name: Eduardo [name]. Date of Service: 12/10/2017 2:30 PM Medical Record Number: [id number] Patient Account Number: [id number] Date of Birth/Sex: [birth year] (84 y.o. M) Treating RN: Ahmed Prima Primary Care Provider: [name] Other Clinician: Referring Provider: [name] Treating Provider/Extender: Melburn Hake, Hadi Dubin Weeks in Treatment: 4 Debridement Performed for Wound #1 Right Lower Leg Assessment: Performed By: Physician STONE III, Anntoinette Haefele E., PA-C Debridement Type: Debridement Pre-procedure Verification/Time Yes - 15:16 Out Taken: Start Time: 15:16 Pain Control: Lidocaine 4% Topical Solution Total Area Debrided (L x W): 2.1 (cm) x 1 (cm) = 2.1 (cm) Tissue and other material Viable, Non-Viable, Slough, Subcutaneous, Fibrin/Exudate, Slough debrided: Level: Skin/Subcutaneous Tissue Debridement Description: Excisional Instrument: Curette Bleeding: Minimum Hemostasis Achieved: Pressure End Time: 15:18 Procedural Pain: 0 Post Procedural Pain: 0 Response to Treatment: Procedure was tolerated well Level of Consciousness: Awake and Alert Post  Debridement Measurements of Total Wound Length: (cm) 2.1 Width: (cm) 1 Depth: (cm) 0.3 Volume: (cm) 0.495 Character of Wound/Ulcer Post Debridement: Requires Further Debridement Post Procedure Diagnosis Same as Pre-procedure Electronic Signature(s) Signed: 12/10/2017 4:10:39 PM By: Alric Quan Signed: 12/11/2017 1:22:53 AM By: Worthy Keeler PA-C Entered By: Alric Quan on 12/10/2017 15:18:04 [name], [name] ([id number]) -------------------------------------------------------------------------------- HPI Details Patient Name: Eduardo [name]. Date of Service: 12/10/2017 2:30 PM Medical Record Number: [id number] Patient Account Number: [id number] Date of Birth/Sex: Jan 11, 1931 (84 y.o. M) Treating RN: Ahmed Prima Primary Care Provider: [name] Other Clinician: Referring Provider: [name] Treating Provider/Extender: Melburn Hake, Gema Ringold Weeks in Treatment: 4 History of Present Illness HPI Description: 11/08/17 on evaluation today patient presents initially concerning a right dramatic lower extremity ulcer/skin tear which occurred three weeks ago. He states that he initially was seen in urgent care where the area was sutured closed. The sutures stayed in for two weeks he tells me. Subsequently he was seen at Dr. Alla German office where the sutures were removed and Steri-Strips were placed over the next five days. Subsequently these were also removed and unfortunately at this time it was noted that the patient had a dehisced larger wound that required further attention. The patient and his wife actually requested referral to the wound care center in [place]. [name] made the referral to [place]. Subsequently the patient does have a history of atrial fibrillation, cardiovascular disease, COPD, stroke, and is on long-term Eliquis. There's no history of diabetes that he states. Subsequently he has no other major medical problems currently. No fevers, chills, nausea, or vomiting  noted at this time. 11/14/17-He is seen in follow-up evaluation for right posterior lower extremity ulcer. There is improvement with granulation tissue present; debridement. We will continue with same treatment plan he'll follow-up next week. The vascular office did call to establish an appointment, but he forgot that we had referred him and did not make an appointment. He was encouraged to contact the office to schedule an appointment for evaluation. 11/22/17 on evaluation  today patient actually appears to be doing well in regard to his ulcer. This has cleaned up very nicely in my pinion I do believe the Iodoflex has been of benefit for him. He actually does have his arterial study which is scheduled for 11/27/17. In general he's having no significant discomfort mainly just with cleansing of the wound but the dressings themselves don't seem to be causing him problems. 11/29/17 on evaluation today patient appears to be doing very well in regard to his lower extremity ulcer. With that being said he unfortunately prior to coming in for evaluation today did have a trip over a world where he failed hurting his back and striking his head. With that being said he has not had any altered mental status since that time and states that he's not really any more dizzy and there does not appear to be any residual effect from this. He also does not have a significant headache. With that being said this is something I did check out for him today briefly at least since he was in our office. I do think that he needs to look out for any signs or symptoms of anything worsening such as nausea, vomiting, fever, chills, confusion, or severe headaches. He has no visual disturbance at this point. 12/06/17 on evaluation today patient appears to be doing excellent in regard to his lower extremity ulcer. This is starting to really fill in which is good news as well. We do have the results of the arterial study unfortunately they  did not perform the TBI portion of the examination and his ABI's were noncompressible. With that being said they did state that he had triphasic blood flow throughout the bilateral lower extremities and there was apparently no evidence of arterial disease on the arterial ultrasound. Overall this is good news we will be able to initiate stronger compression therapy which should help to allow this wound to heal even faster as the patient still continues to have some pitting edema. 12/10/17 on evaluation today patient appears to be doing much better in regard to his lower extremity ulcer. He has been tolerating the dressing changes without complication. Fortunately the compression wrap seems to be doing excellent at this time and overall he is showing signs of good improvement. I'm happy with the overall progress that has been made over the past several weeks but especially in the past week. The patient and his wife both are extremely pleased. Electronic Signature(s) Signed: 12/11/2017 1:22:53 AM By: Worthy Keeler PA-C Entered By: Worthy Keeler on 12/10/2017 23:33:30 [name], [name] ([id number]) -------------------------------------------------------------------------------- Physical Exam Details Patient Name: BREYON, SIGG M. Date of Service: 12/10/2017 2:30 PM Medical Record Number: [id number] Patient Account Number: [id number] Date of Birth/Sex: [birth year] (84 y.o. M) Treating RN: Ahmed Prima Primary Care Provider: [name] Other Clinician: Referring Provider: [name] Treating Provider/Extender: STONE III, Shonique Pelphrey Weeks in Treatment: 4 Constitutional Well-nourished and well-hydrated in no acute distress. Respiratory normal breathing without difficulty. Psychiatric this patient is able to make decisions and demonstrates good insight into disease process. Alert and Oriented x 3. pleasant and cooperative. Notes Patient's wound bed shows signs of some Slough noted on  the surface although much less than previous and this has filled in quite significantly. There still some work to be done but in general I feel like the compression wrap has been beneficial now that we got the results of his arterial study back and were able to initiate compression wrapping. Subsequently he seems to  be tolerating the dressings very well as well. He tolerated sharp debridement today without complication and post debridement the wound bed appears to be doing much better. Electronic Signature(s) Signed: 12/11/2017 1:22:53 AM By: Worthy Keeler PA-C Entered By: Worthy Keeler on 12/10/2017 23:38:03 [name], [name] ([id number]) -------------------------------------------------------------------------------- Physician Orders Details Patient Name: Eduardo [name]. Date of Service: 12/10/2017 2:30 PM Medical Record Number: [id number] Patient Account Number: [id number] Date of Birth/Sex: Oct 30, 1930 (84 y.o. M) Treating RN: Ahmed Prima Primary Care Provider: [name] Other Clinician: Referring Provider: [name] Treating Provider/Extender: Melburn Hake, Terrianna Holsclaw Weeks in Treatment: 4 Verbal / Phone Orders: Yes Clinician: Carolyne Fiscal, Debi Read Back and Verified: Yes Diagnosis Coding ICD-10 Coding Code Description [phone] Non-pressure chronic ulcer of other part of right lower leg with fat layer exposed I48.0 Paroxysmal atrial fibrillation Z79.01 Long term (current) use of anticoagulants J44.9 Chronic obstructive pulmonary disease, unspecified I25.10 Atherosclerotic heart disease of native coronary artery without angina pectoris Wound Cleansing Wound #1 Right Lower Leg o Clean wound with Normal Saline. o May shower with protection. - DO NOT GET WRAP WET Anesthetic (add to Medication List) Wound #1 Right Lower Leg o Topical Lidocaine 4% cream applied to wound bed prior to debridement (In Clinic Only). Primary Wound Dressing Wound #1 Right Lower Leg o  Silver Collagen Secondary Dressing Wound #1 Right Lower Leg o ABD pad Dressing Change Frequency Wound #1 Right Lower Leg o Change dressing every week Follow-up Appointments Wound #1 Right Lower Leg o Return Appointment in 1 week. - Tuesday 12/10/17 Edema Control Wound #1 Right Lower Leg o 3 Layer Compression System - Right Lower Extremity - unna to anchor Additional Orders / Instructions Wound #1 Right Lower Leg Cassaday, Bawi M. ([id number]) o Vitamin A; Vitamin C, Zinc - Please add a multivitamin that has 100% daily value of vitamin A, vitamin C and zinc supplements o Increase protein intake. Patient Medications Allergies: penicillin, Sulfa (Sulfonamide Antibiotics) Notifications Medication Indication Start End lidocaine DOSE 1 - topical 4 % cream - 1 cream topical Electronic Signature(s) Signed: 12/10/2017 4:10:39 PM By: Alric Quan Signed: 12/11/2017 1:22:53 AM By: Worthy Keeler PA-C Entered By: Alric Quan on 12/10/2017 15:16:53 Eduardo [name], [name] ([id number]) -------------------------------------------------------------------------------- Prescription 12/10/2017 Patient Name: Eduardo [name]. Provider: Worthy Keeler PA-C Date of Birth: 07-02-1930 NPI#: [id number] Sex: Jerilynn Mages DEA#: [id number] Phone #: [phone] License #: Patient Address: [facility] [place], [place] [address] [address] [place], [place] [address] [phone] Allergies penicillin Reaction: hives Sulfa (Sulfonamide Antibiotics) Reaction: interferes with Kidneys Medication Medication: Route: Strength: Form: lidocaine topical 4% cream Class: TOPICAL LOCAL ANESTHETICS Dose: Frequency / Time: Indication: 1 1 cream topical Number of Refills: Number of Units: 0 Generic Substitution: Start Date: End Date: Administered at [place]: Yes Time  Administered: Time Discontinued: Note to Pharmacy: [facility]): Date(s): Electronic Signature(s) BARCA, Aero M. ([id number]) Signed: 12/10/2017 4:10:39 PM By: Alric Quan Signed: 12/11/2017 1:22:53 AM By: Worthy Keeler PA-C Entered By: Alric Quan on 12/10/2017 15:16:54 Quattrone, Thimothy M. ([id number]) --------------------------------------------------------------------------------  Problem List Details Patient Name: Eduardo [name]. Date of Service: 12/10/2017 2:30 PM Medical Record Number: [id number] Patient Account Number: [id number] Date of Birth/Sex: [birth year] (84 y.o. M) Treating RN: Ahmed Prima Primary Care Provider: [name] Other Clinician: Referring Provider: [name] Treating Provider/Extender: Melburn Hake, Teia Freitas Weeks in Treatment: 4 Active Problems ICD-10 Evaluated Encounter Code Description Active Date Today Diagnosis L97.812 Non-pressure chronic  ulcer of other part of right lower leg 11/08/2017 No Yes with fat layer exposed I48.0 Paroxysmal atrial fibrillation 11/08/2017 No Yes Z79.01 Long term (current) use of anticoagulants 11/08/2017 No Yes J44.9 Chronic obstructive pulmonary disease, unspecified 11/08/2017 No Yes I25.10 Atherosclerotic heart disease of native coronary artery 11/08/2017 No Yes without angina pectoris Inactive Problems Resolved Problems Electronic Signature(s) Signed: 12/11/2017 1:22:53 AM By: Worthy Keeler PA-C Entered By: Worthy Keeler on 12/10/2017 14:47:02 Eduardo [name], [name] ([id number]) -------------------------------------------------------------------------------- Progress Note Details Patient Name: Eduardo [name]. Date of Service: 12/10/2017 2:30 PM Medical Record Number: [id number] Patient Account Number: [id number] Date of Birth/Sex: 1931-04-20 (84 y.o. M) Treating RN: Ahmed Prima Primary Care Provider: [name] Other Clinician: Referring Provider: [name] Treating  Provider/Extender: Melburn Hake, Leslee Haueter Weeks in Treatment: 4 Subjective Chief Complaint Information obtained from Patient Right lower leg ulcer due to trauma History of Present Illness (HPI) 11/08/17 on evaluation today patient presents initially concerning a right dramatic lower extremity ulcer/skin tear which occurred three weeks ago. He states that he initially was seen in urgent care where the area was sutured closed. The sutures stayed in for two weeks he tells me. Subsequently he was seen at Dr. Alla German office where the sutures were removed and Steri-Strips were placed over the next five days. Subsequently these were also removed and unfortunately at this time it was noted that the patient had a dehisced larger wound that required further attention. The patient and his wife actually requested referral to the wound care center in Dr. [name] made the referral to [place]. Subsequently the patient does have a history of atrial fibrillation, cardiovascular disease, COPD, stroke, and is on long-term Eliquis. There's no history of diabetes that he states. Subsequently he has no other major medical problems currently. No fevers, chills, nausea, or vomiting noted at this time. 11/14/17-He is seen in follow-up evaluation for right posterior lower extremity ulcer. There is improvement with granulation tissue present; debridement. We will continue with same treatment plan he'll follow-up next week. The vascular office did call to establish an appointment, but he forgot that we had referred him and did not make an appointment. He was encouraged to contact the office to schedule an appointment for evaluation. 11/22/17 on evaluation today patient actually appears to be doing well in regard to his ulcer. This has cleaned up very nicely in my pinion I do believe the Iodoflex has been of benefit for him. He actually does have his arterial study which is scheduled for 11/27/17. In general he's having no significant  discomfort mainly just with cleansing of the wound but the dressings themselves don't seem to be causing him problems. 11/29/17 on evaluation today patient appears to be doing very well in regard to his lower extremity ulcer. With that being said he unfortunately prior to coming in for evaluation today did have a trip over a world where he failed hurting his back and striking his head. With that being said he has not had any altered mental status since that time and states that he's not really any more dizzy and there does not appear to be any residual effect from this. He also does not have a significant headache. With that being said this is something I did check out for him today briefly at least since he was in our office. I do think that he needs to look out for any signs or symptoms of anything worsening such as nausea, vomiting, fever, chills, confusion, or severe headaches. He  has no visual disturbance at this point. 12/06/17 on evaluation today patient appears to be doing excellent in regard to his lower extremity ulcer. This is starting to really fill in which is good news as well. We do have the results of the arterial study unfortunately they did not perform the TBI portion of the examination and his ABI's were noncompressible. With that being said they did state that he had triphasic blood flow throughout the bilateral lower extremities and there was apparently no evidence of arterial disease on the arterial ultrasound. Overall this is good news we will be able to initiate stronger compression therapy which should help to allow this wound to heal even faster as the patient still continues to have some pitting edema. 12/10/17 on evaluation today patient appears to be doing much better in regard to his lower extremity ulcer. He has been tolerating the dressing changes without complication. Fortunately the compression wrap seems to be doing excellent at this time and overall he is showing  signs of good improvement. I'm happy with the overall progress that has been made over the past several weeks but especially in the past week. The patient and his wife both are extremely pleased. Patient History RAEGAN, SIPP. ([id number]) Information obtained from Patient. Family History Cancer - Mother,Child, Heart Disease - Father, Lung Disease - Child, Stroke - Maternal Grandparents, No family history of Diabetes, Hereditary Spherocytosis, Hypertension, Kidney Disease, Seizures, Thyroid Problems, Tuberculosis. Social History Former smoker - 53 years, Marital Status - Married, Alcohol Use - Never, Caffeine Use - Daily. Review of Systems (ROS) Constitutional Symptoms (General Health) Denies complaints or symptoms of Fever, Chills. Respiratory The patient has no complaints or symptoms. Cardiovascular Complains or has symptoms of LE edema. Psychiatric The patient has no complaints or symptoms. Objective Constitutional Well-nourished and well-hydrated in no acute distress. Vitals Time Taken: 3:04 PM, Height: 62 in, Weight: 153.7 lbs, BMI: 28.1, Temperature: 98.7 F, Pulse: 83 bpm, Respiratory Rate: 22 breaths/min, Blood Pressure: 143/67 mmHg. Respiratory normal breathing without difficulty. Psychiatric this patient is able to make decisions and demonstrates good insight into disease process. Alert and Oriented x 3. pleasant and cooperative. General Notes: Patient's wound bed shows signs of some Slough noted on the surface although much less than previous and this has filled in quite significantly. There still some work to be done but in general I feel like the compression wrap has been beneficial now that we got the results of his arterial study back and were able to initiate compression wrapping. Subsequently he seems to be tolerating the dressings very well as well. He tolerated sharp debridement today without complication and post debridement the wound bed appears to be  doing much better. Integumentary (Hair, Skin) Wound #1 status is Open. Original cause of wound was Trauma. The wound is located on the Right Lower Leg. The wound measures 2.1cm length x 1cm width x 0.2cm depth; 1.649cm^2 area and 0.33cm^3 volume. There is Fat Layer (Subcutaneous Tissue) Exposed exposed. There is no tunneling or undermining noted. There is a small amount of serous drainage noted. The wound margin is thickened. There is medium (34-66%) pink granulation within the wound bed. There is a medium (34-66%) amount of necrotic tissue within the wound bed including Eschar and Adherent Slough. The periwound skin appearance exhibited: Induration, Erythema. The periwound skin appearance did not exhibit: Callus, Crepitus, Excoriation, Rash, Scarring, Dry/Scaly, Maceration, Atrophie Blanche, Cyanosis, Ecchymosis, Hemosiderin Staining, Mottled, Pallor, Maxon, Roosevelt M. ([id number]) Rubor. The surrounding wound skin color  is noted with erythema which is circumferential. Periwound temperature was noted as No Abnormality. The periwound has tenderness on palpation. Assessment Active Problems ICD-10 Non-pressure chronic ulcer of other part of right lower leg with fat layer exposed Paroxysmal atrial fibrillation Long term (current) use of anticoagulants Chronic obstructive pulmonary disease, unspecified Atherosclerotic heart disease of native coronary artery without angina pectoris Procedures Wound #1 Pre-procedure diagnosis of Wound #1 is a Trauma, Other located on the Right Lower Leg . There was a Excisional Skin/Subcutaneous Tissue Debridement with a total area of 2.1 sq cm performed by STONE III, Soren Lazarz E., PA-C. With the following instrument(s): Curette to remove Viable and Non-Viable tissue/material. Material removed includes Subcutaneous Tissue, Slough, and Fibrin/Exudate after achieving pain control using Lidocaine 4% Topical Solution. No specimens were taken. A time out was conducted at  15:16, prior to the start of the procedure. A Minimum amount of bleeding was controlled with Pressure. The procedure was tolerated well with a pain level of 0 throughout and a pain level of 0 following the procedure. Patient s Level of Consciousness post procedure was recorded as Awake and Alert. Post Debridement Measurements: 2.1cm length x 1cm width x 0.3cm depth; 0.495cm^3 volume. Character of Wound/Ulcer Post Debridement requires further debridement. Post procedure Diagnosis Wound #1: Same as Pre-Procedure Plan Wound Cleansing: Wound #1 Right Lower Leg: Clean wound with Normal Saline. May shower with protection. - DO NOT GET WRAP WET Anesthetic (add to Medication List): Wound #1 Right Lower Leg: Topical Lidocaine 4% cream applied to wound bed prior to debridement (In Clinic Only). Primary Wound Dressing: Wound #1 Right Lower Leg: Silver Collagen Secondary Dressing: Wound #1 Right Lower Leg: ABD pad Dressing Change Frequency: Wound #1 Right Lower Leg: [name], [name] ([id number]) Change dressing every week Follow-up Appointments: Wound #1 Right Lower Leg: Return Appointment in 1 week. - Tuesday 12/10/17 Edema Control: Wound #1 Right Lower Leg: 3 Layer Compression System - Right Lower Extremity - unna to anchor Additional Orders / Instructions: Wound #1 Right Lower Leg: Vitamin A; Vitamin C, Zinc - Please add a multivitamin that has 100% daily value of vitamin A, vitamin C and zinc supplements Increase protein intake. The following medication(s) was prescribed: lidocaine topical 4 % cream 1 1 cream topical was prescribed at facility I am going to suggest currently that we continue with the above wound care orders for the next week. We will subsequently see were things stand at follow-up. Hopefully the patient will continue to show signs of improvement over the next several weeks as well. Please see above for specific wound care orders. We will see patient for re-evaluation  in 1 week(s) here in the clinic. If anything worsens or changes patient will contact our office for additional recommendations. Electronic Signature(s) Signed: 12/11/2017 1:22:53 AM By: Worthy Keeler PA-C Entered By: Worthy Keeler on 12/10/2017 23:38:33 Eduardo [name], [name] ([id number]) -------------------------------------------------------------------------------- ROS/PFSH Details Patient Name: Eduardo [name]. Date of Service: 12/10/2017 2:30 PM Medical Record Number: [id number] Patient Account Number: [id number] Date of Birth/Sex: March 06, 1931 (84 y.o. M) Treating RN: Ahmed Prima Primary Care Provider: [name] Other Clinician: Referring Provider: [name] Treating Provider/Extender: Melburn Hake, Kalid Ghan Weeks in Treatment: 4 Information Obtained From Patient Wound History Do you currently have one or more open woundso Yes How many open wounds do you currently haveo 1 Approximately how long have you had your woundso 3.5 weeks How have you been treating your wound(s) until nowo wet to dry Has your wound(s) ever healed and  then re-openedo No Have you had any lab work done in the past montho Yes Have you tested positive for an antibiotic resistant organism (MRSA, VRE)o No Have you tested positive for osteomyelitis (bone infection)o No Have you had any tests for circulation on your legso No Constitutional Symptoms (General Health) Complaints and Symptoms: Negative for: Fever; Chills Cardiovascular Complaints and Symptoms: Positive for: LE edema Medical History: Positive for: Arrhythmia - A-fib; Congestive Heart Failure; Coronary Artery Disease; Myocardial Infarction - 2013 Negative for: Angina; Deep Vein Thrombosis; Hypertension; Hypotension; Peripheral Arterial Disease; Peripheral Venous Disease; Phlebitis; Vasculitis Eyes Medical History: Positive for: Glaucoma - replaced Negative for: Cataracts; Optic Neuritis Ear/Nose/Mouth/Throat Medical  History: Negative for: Chronic sinus problems/congestion; Middle ear problems Hematologic/Lymphatic Medical History: Negative for: Anemia; Hemophilia; Human Immunodeficiency Virus; Lymphedema; Sickle Cell Disease Respiratory Complaints and Symptoms: No Complaints or Symptoms Medical History: Eduardo [name], BOLEY. ([id number]) Positive for: Chronic Obstructive Pulmonary Disease (COPD) Negative for: Aspiration; Asthma; Pneumothorax; Sleep Apnea; Tuberculosis Gastrointestinal Medical History: Negative for: Cirrhosis ; Colitis; Crohnos; Hepatitis A; Hepatitis B; Hepatitis C Endocrine Medical History: Negative for: Type I Diabetes; Type II Diabetes Genitourinary Medical History: Negative for: End Stage Renal Disease Immunological Medical History: Negative for: Lupus Erythematosus; Raynaudos; Scleroderma Integumentary (Skin) Medical History: Negative for: History of Burn; History of pressure wounds Musculoskeletal Medical History: Negative for: Gout; Rheumatoid Arthritis; Osteoarthritis; Osteomyelitis Neurologic Medical History: Positive for: Neuropathy - Feet and legs Negative for: Dementia; Quadriplegia; Paraplegia; Seizure Disorder Oncologic Medical History: Negative for: Received Chemotherapy; Received Radiation Psychiatric Complaints and Symptoms: No Complaints or Symptoms Medical History: Negative for: Anorexia/bulimia; Confinement Anxiety HBO Extended History Items Eyes: Glaucoma Immunizations Pneumococcal Vaccine: Received Pneumococcal Vaccination: Yes LINKOLN, ALKIRE ([id number]) Implantable Devices Family and Social History Cancer: Yes - Mother,Child; Diabetes: No; Heart Disease: Yes - Father; Hereditary Spherocytosis: No; Hypertension: No; Kidney Disease: No; Lung Disease: Yes - Child; Seizures: No; Stroke: Yes - Maternal Grandparents; Thyroid Problems: No; Tuberculosis: No; Former smoker - 28 years; Marital Status - Married; Alcohol Use: Never; Caffeine  Use: Daily; Advanced Directives: Yes (Not Provided); Patient does not want information on Advanced Directives; Living Will: Yes (Copy provided); Medical Power of Attorney: Yes (Not Provided) Physician Affirmation I have reviewed and agree with the above information. Electronic Signature(s) Signed: 12/11/2017 1:22:53 AM By: Worthy Keeler PA-C Signed: 12/11/2017 3:49:29 PM By: Alric Quan Entered By: Worthy Keeler on 12/10/2017 23:33:50 Noah, Iaan M. ([id number]) -------------------------------------------------------------------------------- SuperBill Details Patient Name: Eduardo [name]. Date of Service: 12/10/2017 Medical Record Number: [id number] Patient Account Number: [id number] Date of Birth/Sex: 10-08-1930 (84 y.o. M) Treating RN: Ahmed Prima Primary Care Provider: [name] Other Clinician: Referring Provider: [name] Treating Provider/Extender: Melburn Hake, Delsy Etzkorn Weeks in Treatment: 4 Diagnosis Coding ICD-10 Codes Code Description [phone] Non-pressure chronic ulcer of other part of right lower leg with fat layer exposed I48.0 Paroxysmal atrial fibrillation Z79.01 Long term (current) use of anticoagulants J44.9 Chronic obstructive pulmonary disease, unspecified I25.10 Atherosclerotic heart disease of native coronary artery without angina pectoris Facility Procedures CPT4 Code Description: [id number] 11042 - DEB SUBQ TISSUE 20 SQ CM/< ICD-10 Diagnosis Description L97.812 Non-pressure chronic ulcer of other part of right lower leg wi Modifier: th fat layer expo Quantity: 1 sed Physician Procedures CPT4 Code Description: [id number] 11042 - WC PHYS SUBQ TISS 20 SQ CM ICD-10 Diagnosis Description L97.812 Non-pressure chronic ulcer of other part of right lower leg wi Modifier: th fat layer expo Quantity: 1 sed Electronic Signature(s) Signed: 12/11/2017 1:22:53 AM By: Melburn Hake,  Arella Blinder PA-C Entered By: Worthy Keeler on 12/10/2017 23:38:44

## 2017-12-15 LAB — PAIN MGMT, PROFILE 8 W/CONF, U
6 Acetylmorphine: NEGATIVE ng/mL (ref <10)
Alcohol Metabolites: NEGATIVE ng/mL (ref <500)
Amphetamines: NEGATIVE ng/mL (ref <500)
Benzodiazepines: NEGATIVE ng/mL (ref <100)
Buprenorphine, Urine: NEGATIVE ng/mL (ref <5)
COCAINE METABOLITE: NEGATIVE ng/mL (ref <150)
CODEINE: NEGATIVE ng/mL (ref <50)
Creatinine: 71 mg/dL
Hydrocodone: 880 ng/mL — ABNORMAL HIGH (ref <50)
Hydromorphone: 408 ng/mL — ABNORMAL HIGH (ref <50)
MDA: NEGATIVE ng/mL (ref <200)
MDMA: NEGATIVE ng/mL (ref <200)
MDMA: NEGATIVE ng/mL (ref <500)
MORPHINE: NEGATIVE ng/mL (ref <50)
Marijuana Metabolite: NEGATIVE ng/mL (ref <20)
NORHYDROCODONE: 741 ng/mL — AB (ref <50)
OPIATES: POSITIVE ng/mL — AB (ref <100)
OXYCODONE: NEGATIVE ng/mL (ref <100)
Oxidant: NEGATIVE ug/mL (ref <200)
pH: 5.95 (ref 4.5–9.0)

## 2017-12-16 ENCOUNTER — Encounter: Payer: Self-pay | Admitting: Pulmonary Disease

## 2017-12-16 ENCOUNTER — Ambulatory Visit (INDEPENDENT_AMBULATORY_CARE_PROVIDER_SITE_OTHER)
Admission: RE | Admit: 2017-12-16 | Discharge: 2017-12-16 | Disposition: A | Discharge status: Home / Self Care | Payer: PPO | Source: Ambulatory Visit | Attending: Pulmonary Disease | Admitting: Pulmonary Disease

## 2017-12-16 ENCOUNTER — Ambulatory Visit: Payer: PPO | Admitting: Pulmonary Disease

## 2017-12-16 ENCOUNTER — Other Ambulatory Visit (INDEPENDENT_AMBULATORY_CARE_PROVIDER_SITE_OTHER): Payer: PPO

## 2017-12-16 VITALS — BP 140/82 | HR 84 | Temp 98.6°F | Ht 62.0 in | Wt 151.2 lb

## 2017-12-16 DIAGNOSIS — I5032 Chronic diastolic (congestive) heart failure: Secondary | ICD-10-CM

## 2017-12-16 DIAGNOSIS — R2689 Other abnormalities of gait and mobility: Secondary | ICD-10-CM

## 2017-12-16 DIAGNOSIS — J439 Emphysema, unspecified: Secondary | ICD-10-CM

## 2017-12-16 DIAGNOSIS — J301 Allergic rhinitis due to pollen: Secondary | ICD-10-CM | POA: Diagnosis not present

## 2017-12-16 DIAGNOSIS — R0600 Dyspnea, unspecified: Secondary | ICD-10-CM | POA: Diagnosis not present

## 2017-12-16 LAB — BRAIN NATRIURETIC PEPTIDE: Pro B Natriuretic peptide (BNP): 428 pg/mL — ABNORMAL HIGH (ref 0.0–100.0)

## 2017-12-16 LAB — BASIC METABOLIC PANEL
BUN: 29 mg/dL — AB (ref 6–23)
CALCIUM: 10.1 mg/dL (ref 8.4–10.5)
CO2: 30 mEq/L (ref 19–32)
Chloride: 104 mEq/L (ref 96–112)
Creatinine, Ser: 0.99 mg/dL (ref 0.40–1.50)
GFR: 76.43 mL/min (ref >60.00)
Glucose, Bld: 91 mg/dL (ref 70–99)
Potassium: 4.4 mEq/L (ref 3.5–5.1)
SODIUM: 142 meq/L (ref 135–145)

## 2017-12-16 MED ORDER — PREDNISONE 10 MG PO TABS: 10.0000 mg | ORAL_TABLET | Freq: Every day | ORAL | 0 refills | Status: DC

## 2017-12-16 MED ORDER — LEVOFLOXACIN 500 MG PO TABS: 500.0000 mg | ORAL_TABLET | Freq: Every day | ORAL | 0 refills | Status: DC

## 2017-12-16 NOTE — Assessment & Plan Note (Signed)
  Chest x-ray today  Continue daily Lasix 20 mg  Lab work today BNP and bmet

## 2017-12-16 NOTE — Assessment & Plan Note (Signed)
Levaquin 500 mg tablet >>> Take 1 tablet by mouth daily for the next 7 days >>> Take with food  Chest x-ray today  Prednisone 10 mg tablet >>>4 tabs for 3 days, then 3 tabs for 3 days, 2 tabs for 3 days, then 1 tab for 3 days, then stop  Walk today in office to check for oxygen desaturation

## 2017-12-16 NOTE — Assessment & Plan Note (Signed)
Continue walker use

## 2017-12-16 NOTE — Assessment & Plan Note (Signed)
Continue Singulair Continue Zyrtec

## 2017-12-16 NOTE — Progress Notes (Signed)
Chest x-ray results of come back showing no pneumonia or acute changes.  Mild pulmonary congestion or fluid overload.  Consistent with elevated BNP found on lab work today.  Proceed forward with 40 mg total daily Lasix for the next 5 days (Two 20 mg tablets daily), then resume daily dose of 20 mg daily.  We will repeat chest x-ray at 2-week follow-up.  Wyn Quaker, FNP

## 2017-12-16 NOTE — Patient Instructions (Addendum)
Levaquin 500 mg tablet >>> Take 1 tablet by mouth daily for the next 7 days >>> Take with food  Chest x-ray today  Prednisone 10 mg tablet >>>4 tabs for 3 days, then 3 tabs for 3 days, 2 tabs for 3 days, then 1 tab for 3 days, then stop >>>take with food   Lab work today >>> BNP, Bmet  Continue Lasix 20 mg daily  Walk today in office to check if you have any oxygen desaturations with exertion  >>> Did not qualify for oxygen today, will re-walk you in 2 weeks  Continue Stiolto inhaler Continue nebulizers Continue rescue inhaler Continue Zyrtec Continue Singulair  Follow-up with primary care regarding ongoing back pain >>> Notify primary care that muscle relaxers and current pain meds are not controlling pain at this time  Follow-up in 2 weeks >>> Will re-walk you in 2 weeks      Please contact the office if your symptoms worsen or you have concerns that you are not improving.   Thank you for choosing [facility] for your healthcare, and for allowing [place] to partner with you on your healthcare journey. I am thankful to be able to provide care to you today.   Wyn Quaker FNP-C

## 2017-12-16 NOTE — Progress Notes (Signed)
Lab test results of come back today.  Showing fluid retention.  Let us ensure that you are taking your Lasix 20 mg daily.  We can increase your Lasix to 40 mg daily for the next 5 days (two 20 mg tablets), then resume your 20mg  daily.  Your chest x-ray is also showing moderate fluid overload.  We will repeat chest x-ray at 2-week appointment.  Continue to adhere to low-sodium diet, avoiding high sodium foods such as fast food, eating out, chips snacks pretzels.  I encourage you to weigh yourself daily, watching for weight increases of 3 pounds in 24 hours, or 5 pounds over 3 days.  Notify primary care if you are seeing these increases.  Wyn Quaker FNP

## 2017-12-16 NOTE — Progress Notes (Signed)
@Patient  ID: Eduardo [name], male    DOB: 1931/04/17, 84 y.o.   MRN: [id number]  Chief Complaint  Patient presents with  . Acute Visit    States he is very congested with large amounts of grey mucous and some wheezing.     Referring provider: Venia Carbon, MD  HPI: 84 year old former smoker seen by Dr. Lamonte Sakai.  Chronic cough.  Has COPD.  Recent [facility] Encounters:  ROV 08/05/17 --84 year old former smoker with a history of coronary artery disease, atrial fibrillation, allergic rhinitis, chronic cough with upper airway instability.  He also has COPD with an FEV1 of 1.03 L.  He is currently managed on Stiolto.  Also uses Zyrtec, Atrovent nasal spray for his chronic rhinitis. He still goes to the gym 3x a week. He began to get some increased congestion beginning of the year. Has been using mucinex, robutussin. Reliable with Stiolto. He has albuterol nebs, uses about 1x a day.   12/09/17 Acute Patient presenting today for office visit with continued symptoms of shortness of breath, wheezing, fatigue.  Patient reporting that he is using his Stiolto inhaler, daily antihistamine, rescue inhaler, nebulizers.  Patient also reporting that he fell at home and has seen primary care for this.  Patient reporting that he needs a refill of his hydrocodone for his back pain. Plan: Doxycycline, prednisone taper, resume Astelin nasal spray, follow-up with primary care regarding pain management and refill of hydrocodone    12/16/17 OV  Pleasant 84 year old patient reporting for 1 week follow-up from being seen for COPD exacerbation.  Patient is almost done with his doxycycline prescription, and is finishing his prednisone taper.  Patient still having significant amounts of congestion with green mucus.  Patient continuing to report that he needs better management of his pain.  Status post fall.  Patient is currently being managed by primary care.  They report the primary care provider muscle  relaxer prescription but he feels that this was not strong enough and barely did anything.  He has not followed up with primary care since then.    Allergies  Allergen Reactions  . Penicillins Itching    Has patient had a PCN reaction causing immediate rash, facial/tongue/throat swelling, SOB or lightheadedness with hypotension: Yes Has patient had a PCN reaction causing severe rash involving mucus membranes or skin necrosis: No Has patient had a PCN reaction that required hospitalization No Has patient had a PCN reaction occurring within the last 10 years: No If all of the above answers are "NO", then may proceed with Cephalosporin use.   . Sulfonamide Derivatives Other (See Comments)    "it affected my kidneys"  . Tape Other (See Comments)    Arms black and blue, tears skin.  Please use "paper" tape    Immunization History  Administered Date(s) Administered  . Influenza Split 03/12/2011, 05/12/2012  . Influenza Whole 05/31/2009  . Influenza,inj,Quad PF,6+ Mos 03/05/2013, 02/17/2014, 03/11/2015, 04/03/2016, 02/05/2017  . Pneumococcal Conjugate-13 04/19/2014  . Pneumococcal Polysaccharide-23 06/11/2005, 06/24/2017  . Td 12/27/2010  . Zoster 09/09/2006    Past Medical History:  Diagnosis Date  . Allergy   . Anemia 2012, 08/2015   chronic. Acute due to large hematoma 2013. 2017: due to gastric AVM bleeding.   . Anxiety   . Atrial fibrillation ([place]) 2012   a.  CHA2DS2VASc = 6-->eliquis;  b. 12/2015 Echo: EF 60-65%, no rwma, LVH, mild AS/MS/MR, sev dil LA, mild TR, PASP 64mmHg.  Marland Kitchen CAD (coronary artery disease)  a. 06/2010 Cath: LM nl, LAD 50p/m, LCX 79m, RCA 50p/m -->catheter dissection but good flow, 70d, RPDA 50-70;  b. 06/2010 Relook cath due to bradycardia and inf ST elev: RCA dissection @ ostium extending into coronary cusp and prox RCA, Ao dissection-->less staining than previously-->Med Rx.  Marland Kitchen COPD (chronic obstructive pulmonary disease) ([place]) 2013   exertional dyspnea  .  Gastric angiodysplasia with hemorrhage 08/2015  . Hx of colonic polyps 2001, 2011   adenomatous 2001. HP 2001, 2011.  Marland Kitchen Hyperlipidemia   . Myocardial infarction ([place]) 06/2010.  12/2011.   "twice; 1 day apart; during/after cath". NQMI in setting severe anemia 2013.   Marland Kitchen Neuropathy 2014   in feet, likely due to spinal stenosis, DDD, HNP  . Osteoarthritis 2002   spinal stenosis, spondylolisthesis, disc protrusion multilevel in lumbar spine  . Stroke ([place]) 2016  . Upper GI bleed 08/2015   EGD: 2 small gastric AVMs, 1 actively bleeding.  Both treated with APC ablation, clipping of the bleeder    Tobacco History: Social History   Tobacco Use  Smoking Status Former Smoker  . Packs/day: 2.50  . Years: 30.00  . Pack years: 75.00  . Types: Cigarettes  . Last attempt to quit: 06/11/1978  . Years since quitting: 39.5  Smokeless Tobacco Never Used   Counseling given: Yes   Outpatient Encounter Medications as of 12/16/2017  Medication Sig  . acetaminophen (TYLENOL) 650 MG CR tablet Take 1,300 mg by mouth 2 (two) times daily.  Marland Kitchen albuterol (PROAIR HFA) 108 (90 Base) MCG/ACT inhaler Inhale 2 puffs into the lungs every 6 (six) hours as needed for wheezing or shortness of breath.  Marland Kitchen albuterol (PROVENTIL) (2.5 MG/3ML) 0.083% nebulizer solution use 1 vial in nebulizer every 4 hours as needed for wheezing or shortness of breath  . ALPRAZolam (XANAX) 0.25 MG tablet Take 1 tablet (0.25 mg total) by mouth 2 (two) times daily as needed for anxiety.  Marland Kitchen atorvastatin (LIPITOR) 80 MG tablet TAKE ONE TABLET BY MOUTH ONE TIME DAILY   . azelastine (ASTELIN) 0.1 % nasal spray Place 2 sprays into both nostrils at bedtime. Use in each nostril as directed  . B Complex-C (SUPER B COMPLEX PO) Take 1 tablet by mouth at bedtime.   . cetirizine (ZYRTEC) 10 MG tablet Take 10 mg by mouth at bedtime.   . cholecalciferol (VITAMIN D) 1000 units tablet Take 1,000 Units by mouth daily.  Marland Kitchen doxycycline (VIBRA-TABS) 100 MG tablet Take  1 tablet (100 mg total) by mouth 2 (two) times daily.  Marland Kitchen ELIQUIS 5 MG TABS tablet TAKE 1 TABLET BY MOUTH 2 TIMES DAILY  . escitalopram (LEXAPRO) 20 MG tablet Take 20 mg by mouth at bedtime.   . fenofibrate 160 MG tablet TAKE 1 TABLET BY MOUTH AT BEDTIME.  . furosemide (LASIX) 20 MG tablet TAKE 1 TABLET BY MOUTH DAILY  . hydrALAZINE (APRESOLINE) 10 MG tablet Take 1 tablet (10 mg total) by mouth 3 (three) times daily.  Marland Kitchen HYDROcodone-acetaminophen (NORCO/VICODIN) 5-325 MG tablet Take 1 tablet by mouth every 4 (four) hours as needed for moderate pain.  Marland Kitchen ipratropium (ATROVENT) 0.03 % nasal spray PLACE 2 SPRAYS INTO BOTH NOSTRILS AT BEDTIME.  . IRON PO Take by mouth.  . lamoTRIgine (LAMICTAL) 150 MG tablet Take 150 mg by mouth at bedtime. Reported on 08/14/2015  . montelukast (SINGULAIR) 10 MG tablet Take 1 tablet (10 mg total) by mouth at bedtime.  . Multiple Vitamin (MULTIVITAMIN) tablet Take 1 tablet by mouth at  bedtime.   . pantoprazole (PROTONIX) 40 MG tablet TAKE 1 TABLET BY MOUTH DAILY BEFORE BREAKFAST.  Marland Kitchen Tiotropium Bromide-Olodaterol (STIOLTO RESPIMAT) 2.5-2.5 MCG/ACT AERS Inhale 2 puffs into the lungs daily.  . tizanidine (ZANAFLEX) 2 MG capsule Take 1 capsule (2 mg total) by mouth 3 (three) times daily as needed for muscle spasms.  Marland Kitchen triamcinolone cream (KENALOG) 0.1 % Apply 1 application topically 2 (two) times daily as needed (for skin).   . Turmeric 500 MG TABS Take 2 tablets by mouth.  . verapamil (CALAN-SR) 240 MG CR tablet TAKE 1 TABLET BY MOUTH AT BEDTIME  . levofloxacin (LEVAQUIN) 500 MG tablet Take 1 tablet (500 mg total) by mouth daily.  . predniSONE (DELTASONE) 10 MG tablet Take 1 tablet (10 mg total) by mouth daily with breakfast.  . [DISCONTINUED] predniSONE (DELTASONE) 10 MG tablet 4 tabs for 2 days, then 3 tabs for 2 days, 2 tabs for 2 days, then 1 tab for 2 days, then stop (Patient not taking: Reported on 12/16/2017)   No facility-administered encounter medications on file as  of 12/16/2017.      Review of Systems  Review of Systems  Constitutional: Positive for fatigue. Negative for chills and fever.  HENT: Positive for postnasal drip. Negative for congestion, sinus pressure, sinus pain, sneezing and sore throat.   Respiratory: Positive for cough (grey mucous ), chest tightness, shortness of breath and wheezing.   Cardiovascular: Negative for chest pain and palpitations.  Gastrointestinal: Positive for abdominal pain. Negative for abdominal distention, diarrhea, nausea and vomiting.       Trouble swallowing food 2x a month   Genitourinary: Negative for hematuria and urgency.  Musculoskeletal: Positive for arthralgias and back pain.  Skin: Negative for color change and rash.  Neurological: Negative for dizziness, light-headedness and headaches.  Psychiatric/Behavioral: Positive for sleep disturbance (needing to sleep in recliner ). Negative for agitation and dysphoric mood. The patient is not nervous/anxious.   All other systems reviewed and are negative.    Physical Exam  BP 140/82   Pulse 84   Temp 98.6 F (37 C) (Oral)   Ht 5\' 2"  (1.575 m)   Wt 151 lb 3.2 oz (68.6 kg)   SpO2 96%   BMI 27.65 kg/m   Wt Readings from Last 5 Encounters:  12/16/17 151 lb 3.2 oz (68.6 kg)  12/11/17 155 lb (70.3 kg)  12/09/17 150 lb 12.8 oz (68.4 kg)  12/04/17 150 lb 12 oz (68.4 kg)  12/04/17 151 lb (68.5 kg)     Physical Exam  Constitutional: He is well-developed, well-nourished, and in no distress. No distress.  HENT:  Head: Normocephalic and atraumatic.  Right Ear: External ear normal.  Left Ear: External ear normal.  Nose: Nose normal.  Mouth/Throat: Oropharynx is clear and moist. No oropharyngeal exudate.  Eyes: Pupils are equal, round, and reactive to light. EOM are normal.  Neck: Normal range of motion. Neck supple.  Cardiovascular: Normal rate, regular rhythm and normal heart sounds.  Pulmonary/Chest: Effort normal. No accessory muscle usage. No  respiratory distress. He has wheezes (Inspiratory and expiratory wheezes - through out exam).  Abdominal: Soft. Bowel sounds are normal. There is tenderness.  Musculoskeletal: Normal range of motion. He exhibits edema (2-3+ bilateral lower extremity edema, greater and foot versus calf, difficult to assess with right calf dressing).  Lymphadenopathy:    He has no cervical adenopathy.  Neurological: He is alert. Gait normal. GCS score is 15.  Skin: Skin is warm and dry. He  is not diaphoretic.  Psychiatric: Memory, affect and judgment normal. His mood appears anxious (wants better control of back pain / doesnt think muscle relaxers work well ).  Nursing note and vitals reviewed.    Lab Results:  CBC    Component Value Date/Time   WBC 7.0 07/01/2017 1218   WBC 6.8 06/24/2017 1709   RBC 3.56 (L) 07/01/2017 1218   HGB 10.2 (L) 07/01/2017 1218   HGB 11.4 (L) 09/08/2011 0956   HCT 32.5 (L) 07/01/2017 1218   HCT 35.4 (L) 09/08/2011 0956   PLT 175 07/01/2017 1218   PLT 203 09/08/2011 0956   MCV 91.3 07/01/2017 1218   MCV 85 09/08/2011 0956   MCH 28.7 07/01/2017 1218   MCHC 31.4 (L) 07/01/2017 1218   RDW 15.7 (H) 07/01/2017 1218   RDW 14.7 (H) 09/08/2011 0956   LYMPHSABS 1.3 07/01/2017 1218   MONOABS 0.5 07/01/2017 1218   EOSABS 0.3 07/01/2017 1218   BASOSABS 0.0 07/01/2017 1218    BMET    Component Value Date/Time   NA 142 12/16/2017 1542   NA 143 08/08/2016 0847   NA 142 09/08/2011 0956   K 4.4 12/16/2017 1542   K 3.9 09/08/2011 0956   CL 104 12/16/2017 1542   CL 105 09/08/2011 0956   CO2 30 12/16/2017 1542   CO2 26 09/08/2011 0956   GLUCOSE 91 12/16/2017 1542   GLUCOSE 97 09/08/2011 0956   BUN 29 (H) 12/16/2017 1542   BUN 25 08/08/2016 0847   BUN 20 (H) 09/08/2011 0956   CREATININE 0.99 12/16/2017 1542   CREATININE 1.21 07/01/2017 1218   CREATININE 1.01 08/22/2015 0957   CALCIUM 10.1 12/16/2017 1542   CALCIUM 9.0 09/08/2011 0956   GFRNONAA 53 (L) 07/01/2017 1218    GFRNONAA >60 09/08/2011 0956   GFRAA >60 07/01/2017 1218   GFRAA >60 09/08/2011 0956    BNP    Component Value Date/Time   BNP 261.9 (H) 07/13/2016 2303    ProBNP    Component Value Date/Time   PROBNP 428.0 (H) 12/16/2017 1542    Imaging: Dg Chest 2 View  Result Date: 12/16/2017 CLINICAL DATA:  Two weeks of worsening dyspnea. History of heart failure, atrial fibrillation, previous MI, COPD, former smoker. EXAM: CHEST - 2 VIEW COMPARISON:  Chest x-ray of July 16, 2016 FINDINGS: The lungs are mildly hyperinflated. The interstitial markings are coarse. There is no alveolar infiltrate or pleural effusion. The cardiac silhouette is enlarged. The pulmonary vascularity is mildly prominent centrally. There is calcification in the wall of the aortic arch in in the mitral valvular annulus. Old right posterior rib fractures are observed. There is multilevel degenerative disc disease of the thoracic spine with mild vertebral body height loss at multiple levels greatest at L1. IMPRESSION: Chronic bronchitic-smoking related changes.  No acute pneumonia. Mild enlargement of the cardiac silhouette with mild pulmonary vascular congestion consistent with low-grade compensated CHF. Thoracic aortic atherosclerosis. Electronically Signed   By: David  Martinique M.D.   On: 12/16/2017 16:07   US Carotid Bilateral  Result Date: 11/28/2017 CLINICAL DATA:  TIA. History of hyperlipidemia and CAD (history of myocardial infarction). EXAM: BILATERAL CAROTID DUPLEX ULTRASOUND TECHNIQUE: Pearline Cables scale imaging, color Doppler and duplex ultrasound were performed of bilateral carotid and vertebral arteries in the neck. COMPARISON:  None. FINDINGS: Criteria: Quantification of carotid stenosis is based on velocity parameters that correlate the residual internal carotid diameter with NASCET-based stenosis levels, using the diameter of the distal internal carotid lumen as the  denominator for stenosis measurement. The following velocity  measurements were obtained: RIGHT ICA:  79/19 cm/sec CCA:  74/25 cm/sec SYSTOLIC ICA/CCA RATIO:  0.9 ECA:  92 cm/sec LEFT ICA:  87/13 cm/sec CCA:  956/38 cm/sec SYSTOLIC ICA/CCA RATIO:  0.8 ECA:  104 cm/sec RIGHT CAROTID ARTERY: There is a moderate amount of eccentric echogenic plaque within the mid aspect of the right common carotid artery (images 8 and 9). There is a moderate to large amount of eccentric mixed echogenic plaque within the right carotid bulb (image 18), extending to involve the origin and proximal aspects of the right internal carotid artery (image 27, not resulting in elevated peak systolic velocities within the interrogated course the right internal carotid artery to suggest a hemodynamically significant stenosis. RIGHT VERTEBRAL ARTERY:  Antegrade flow LEFT CAROTID ARTERY: There is a minimal amount atherosclerotic plaque and intimal thickening seen throughout the left common carotid artery (representative images 43 and 47). There is a minimal to moderate amount of atherosclerotic plaque within the left carotid bulb, extending to involve the origin and proximal aspects of the left internal carotid artery (image 61), not resulting in elevated peak systolic velocities within the interrogated course the left internal carotid artery to suggest a hemodynamically significant stenosis. LEFT VERTEBRAL ARTERY:  Antegrade Flow IMPRESSION: Moderate to large amount of bilateral atherosclerotic plaque, right greater than left, not resulting in a hemodynamically significant stenosis within either internal carotid artery. Electronically Signed   By: Sandi Mariscal M.D.   On: 11/28/2017 07:46   US Arterial Abi (screening Lower Extremity)  Result Date: 11/28/2017 CLINICAL DATA:  84 year old male with a history of right leg wound. Cardiovascular risk factors include smoking, hyperlipidemia, known vascular disease with prior procedures EXAM: NONINVASIVE PHYSIOLOGIC VASCULAR STUDY OF BILATERAL LOWER EXTREMITIES  TECHNIQUE: Evaluation of both lower extremities was performed at rest, including calculation of ankle-brachial indices, multiple segmental pressure evaluation, segmental Doppler and segmental pulse volume recording. COMPARISON:  None. FINDINGS: Right: Resting ankle brachial index:  Non calculable Segmental blood pressure: Symmetric upper extremity pressures Doppler: Segmental Doppler at the right ankle demonstrates triphasic waveforms. Directed duplex of the right lower extremity demonstrates patent common femoral artery, profunda femoris, SFA, popliteal artery, posterior tibial artery, and dorsalis pedis. Triphasic waveforms maintained. Left: Resting ankle brachial index: Non calculable Segmental blood pressure: Symmetric upper extremity pressures Doppler: Segmental Doppler at the left ankle demonstrates triphasic waveforms Directed duplex of the left lower extremity demonstrates patent common femoral artery, profunda femoris, SFA, popliteal artery, anterior tibial artery, posterior tibial artery. Triphasic waveforms maintained throughout. IMPRESSION: Resting ABI of the bilateral lower extremities are non calculable. Triphasic waveforms maintained throughout the bilateral lower extremities. Signed, Dulcy Fanny. Dellia Nims, RPVI [facility] Electronically Signed   By: Corrie Mckusick D.O.   On: 11/28/2017 07:45   US Arterial Lower Extremity Duplex Bilateral  Result Date: 11/28/2017 CLINICAL DATA:  84 year old male with a history of right leg wound. Cardiovascular risk factors include smoking, hyperlipidemia, known vascular disease with prior procedures EXAM: NONINVASIVE PHYSIOLOGIC VASCULAR STUDY OF BILATERAL LOWER EXTREMITIES TECHNIQUE: Evaluation of both lower extremities was performed at rest, including calculation of ankle-brachial indices, multiple segmental pressure evaluation, segmental Doppler and segmental pulse volume recording. COMPARISON:  None.  FINDINGS: Right: Resting ankle brachial index:  Non calculable Segmental blood pressure: Symmetric upper extremity pressures Doppler: Segmental Doppler at the right ankle demonstrates triphasic waveforms. Directed duplex of the right lower extremity demonstrates patent common femoral artery, profunda femoris, SFA, popliteal  artery, posterior tibial artery, and dorsalis pedis. Triphasic waveforms maintained. Left: Resting ankle brachial index: Non calculable Segmental blood pressure: Symmetric upper extremity pressures Doppler: Segmental Doppler at the left ankle demonstrates triphasic waveforms Directed duplex of the left lower extremity demonstrates patent common femoral artery, profunda femoris, SFA, popliteal artery, anterior tibial artery, posterior tibial artery. Triphasic waveforms maintained throughout. IMPRESSION: Resting ABI of the bilateral lower extremities are non calculable. Triphasic waveforms maintained throughout the bilateral lower extremities. Signed, Dulcy Fanny. Dellia Nims, RPVI [facility] Electronically Signed   By: Corrie Mckusick D.O.   On: 11/28/2017 07:45     Assessment & Plan:   Pleasant 84 year old patient seen in office today.  Symptoms remain after round of doxycycline.  Will order Levaquin.  Chest x-ray today.  No longer prednisone taper.  Will order lab work as well to rule out any fluid overload or kidney functioning issues.  Walked in office and patient did not qualify for oxygen, patient was unable to really walk at an exertional pace with walker.  Patient took multiple breaks.  We will try to re-walk in 2 weeks at next follow-up visit.  Continue Stiolto inhaler, nebulizers, rescue inhaler, Zyrtec, Singulair.  Patient has persistent complaints requesting increase in narcotics, muscle relaxers, and worsening back pain.  Patient to follow-up with primary care regarding the symptoms.  Patient does not feel that the current  regimen manages his pain well.  Informed him if the symptoms persist or this continues going longer than the acute stage of that and now he may benefit from a pain management referral.  Follow-up in 2 weeks.  Allergic rhinitis Continue Singulair Continue Zyrtec   COPD (chronic obstructive pulmonary disease) with emphysema (HCC) Levaquin 500 mg tablet >>> Take 1 tablet by mouth daily for the next 7 days >>> Take with food  Chest x-ray today  Prednisone 10 mg tablet >>>4 tabs for 3 days, then 3 tabs for 3 days, 2 tabs for 3 days, then 1 tab for 3 days, then stop  Walk today in office to check for oxygen desaturation  Imbalance Continue walker use  Chronic diastolic HF (heart failure) ([place])  Chest x-ray today  Continue daily Lasix 20 mg  Lab work today BNP and bmet       Lauraine Rinne, NP 12/16/2017

## 2017-12-17 ENCOUNTER — Other Ambulatory Visit: Payer: Self-pay | Admitting: *Deleted

## 2017-12-17 ENCOUNTER — Telehealth: Payer: Self-pay | Admitting: Pulmonary Disease

## 2017-12-17 DIAGNOSIS — S32010A Wedge compression fracture of first lumbar vertebra, initial encounter for closed fracture: Secondary | ICD-10-CM | POA: Diagnosis not present

## 2017-12-17 DIAGNOSIS — R7989 Other specified abnormal findings of blood chemistry: Secondary | ICD-10-CM

## 2017-12-17 DIAGNOSIS — M5136 Other intervertebral disc degeneration, lumbar region: Secondary | ICD-10-CM | POA: Diagnosis not present

## 2017-12-17 DIAGNOSIS — R0602 Shortness of breath: Secondary | ICD-10-CM

## 2017-12-17 DIAGNOSIS — J439 Emphysema, unspecified: Secondary | ICD-10-CM

## 2017-12-17 DIAGNOSIS — M48062 Spinal stenosis, lumbar region with neurogenic claudication: Secondary | ICD-10-CM | POA: Diagnosis not present

## 2017-12-17 DIAGNOSIS — M5416 Radiculopathy, lumbar region: Secondary | ICD-10-CM | POA: Diagnosis not present

## 2017-12-17 NOTE — Telephone Encounter (Signed)
Notes recorded by Lauraine Rinne, NP on 12/16/2017 at 4:54 PM EDT Lab test results of come back today. Showing fluid retention. Let us ensure that you are taking your Lasix 20 mg daily. We can increase your Lasix to 40 mg daily for the next 5 days (two 20 mg tablets), then resume your 20mg  daily. Your chest x-ray is also showing moderate fluid overload.  We will repeat chest x-ray at 2-week appointment.  Continue to adhere to low-sodium diet, avoiding high sodium foods such as fast food, eating out, chips snacks pretzels.  I encourage you to weigh yourself daily, watching for weight increases of 3 pounds in 24 hours, or 5 pounds over 3 days. Notify primary care if you are seeing these increases.  Called and spoke with pt's spouse [name] letting her know the results of pt's labwork and cxr and stated to her we would repeat the cxr at next OV.  [name] expressed understanding. Nothing further needed.

## 2017-12-18 ENCOUNTER — Encounter: Payer: PPO | Admitting: Internal Medicine

## 2017-12-18 ENCOUNTER — Telehealth: Payer: Self-pay | Admitting: Pulmonary Disease

## 2017-12-18 ENCOUNTER — Encounter: Payer: Self-pay | Admitting: Emergency Medicine

## 2017-12-18 DIAGNOSIS — L97812 Non-pressure chronic ulcer of other part of right lower leg with fat layer exposed: Secondary | ICD-10-CM | POA: Diagnosis not present

## 2017-12-18 NOTE — Telephone Encounter (Signed)
See MyChart message in regards to the same message. Will close this encounter.

## 2017-12-19 NOTE — Progress Notes (Signed)
LADISLAO, COHENOUR ([id number]) Visit Report for 12/18/2017 Arrival Information Details Patient Name: Eduardo [name], Eduardo [name]. Date of Service: 12/18/2017 1:00 PM Medical Record Number: [id number] Patient Account Number: [id number] Date of Birth/Sex: [birth year] (84 y.o. M) Treating RN: Ahmed Prima Primary Care Elvera Almario: [name] Other Clinician: Referring Takirah Binford: [name] Treating Antar Milks/Extender: Tito Dine in Treatment: 5 Visit Information History Since Last Visit All ordered tests and consults were completed: No Patient Arrived: Eduardo [name] Added or deleted any medications: No Arrival Time: 12:57 Any new allergies or adverse reactions: No Accompanied By: spouse Had a fall or experienced change in No Transfer Assistance: EasyPivot Patient activities of daily living that may affect Lift risk of falls: Patient Identification Verified: Yes Signs or symptoms of abuse/neglect since last visito No Secondary Verification Process Yes Hospitalized since last visit: No Completed: Implantable device outside of the clinic excluding No Patient Requires Transmission-Based No cellular tissue based products placed in the center Precautions: since last visit: Patient Has Alerts: Yes Has Dressing in Place as Prescribed: Yes Patient Alerts: Patient on Blood Has Compression in Place as Prescribed: Yes Thinner Eliquis Pain Present Now: No 11/08/17 ABI >220 Biateral Electronic Signature(s) Signed: 12/18/2017 5:25:23 PM By: Alric Quan Entered By: Alric Quan on 12/18/2017 12:57:59 Stamant, Chukwuma M. ([id number]) -------------------------------------------------------------------------------- Clinic Level of Care Assessment Details Patient Name: Eduardo [name]. Date of Service: 12/18/2017 1:00 PM Medical Record Number: [id number] Patient Account Number: [id number] Date of Birth/Sex: 02/13/1931 (84 y.o. M) Treating RN: Roger Shelter Primary Care  Zaviyar Rahal: [name] Other Clinician: Referring Monicka Cyran: [name] Treating Callahan Peddie/Extender: Tito Dine in Treatment: 5 Clinic Level of Care Assessment Items TOOL 4 Quantity Score X - Use when only an EandM is performed on FOLLOW-UP visit 1 0 ASSESSMENTS - Nursing Assessment / Reassessment X - Reassessment of Co-morbidities (includes updates in patient status) 1 10 X- 1 5 Reassessment of Adherence to Treatment Plan ASSESSMENTS - Wound and Skin Assessment / Reassessment X - Simple Wound Assessment / Reassessment - one wound 1 5 []  - 0 Complex Wound Assessment / Reassessment - multiple wounds []  - 0 Dermatologic / Skin Assessment (not related to wound area) ASSESSMENTS - Focused Assessment []  - Circumferential Edema Measurements - multi extremities 0 []  - 0 Nutritional Assessment / Counseling / Intervention []  - 0 Lower Extremity Assessment (monofilament, tuning fork, pulses) []  - 0 Peripheral Arterial Disease Assessment (using hand held doppler) ASSESSMENTS - Ostomy and/or Continence Assessment and Care []  - Incontinence Assessment and Management 0 []  - 0 Ostomy Care Assessment and Management (repouching, etc.) PROCESS - Coordination of Care X - Simple Patient / Family Education for ongoing care 1 15 []  - 0 Complex (extensive) Patient / Family Education for ongoing care []  - 0 Staff obtains Programmer, systems, Records, Test Results / Process Orders []  - 0 Staff telephones HHA, Nursing Homes / Clarify orders / etc []  - 0 Routine Transfer to another Facility (non-emergent condition) []  - 0 Routine Hospital Admission (non-emergent condition) []  - 0 New Admissions / Biomedical engineer / Ordering NPWT, Apligraf, etc. []  - 0 Emergency Hospital Admission (emergent condition) X- 1 10 Simple Discharge Coordination Kostelecky, Aadil M. ([id number]) []  - 0 Complex (extensive) Discharge Coordination PROCESS - Special Needs []  - Pediatric / Minor Patient  Management 0 []  - 0 Isolation Patient Management []  - 0 Hearing / Language / Visual special needs []  - 0 Assessment of Community assistance (transportation, D/C planning, etc.) []  - 0 Additional assistance / Altered mentation []  -  0 Support Surface(s) Assessment (bed, cushion, seat, etc.) INTERVENTIONS - Wound Cleansing / Measurement X - Simple Wound Cleansing - one wound 1 5 []  - 0 Complex Wound Cleansing - multiple wounds X- 1 5 Wound Imaging (photographs - any number of wounds) []  - 0 Wound Tracing (instead of photographs) X- 1 5 Simple Wound Measurement - one wound []  - 0 Complex Wound Measurement - multiple wounds INTERVENTIONS - Wound Dressings X - Small Wound Dressing one or multiple wounds 1 10 []  - 0 Medium Wound Dressing one or multiple wounds []  - 0 Large Wound Dressing one or multiple wounds []  - 0 Application of Medications - topical []  - 0 Application of Medications - injection INTERVENTIONS - Miscellaneous []  - External ear exam 0 []  - 0 Specimen Collection (cultures, biopsies, blood, body fluids, etc.) []  - 0 Specimen(s) / Culture(s) sent or taken to Lab for analysis []  - 0 Patient Transfer (multiple staff / Civil Service fast streamer / Similar devices) []  - 0 Simple Staple / Suture removal (25 or less) []  - 0 Complex Staple / Suture removal (26 or more) []  - 0 Hypo / Hyperglycemic Management (close monitor of Blood Glucose) []  - 0 Ankle / Brachial Index (ABI) - do not check if billed separately X- 1 5 Vital Signs Eduardo [name], [name] ([id number]) Has the patient been seen at the hospital within the last three years: Yes Total Score: 75 Level Of Care: New/Established - Level 2 Electronic Signature(s) Signed: 12/18/2017 5:14:04 PM By: Roger Shelter Entered By: Roger Shelter on 12/18/2017 13:31:11 Eduardo [name], Eduardo [place]. ([id number]) -------------------------------------------------------------------------------- Encounter Discharge Information  Details Patient Name: Eduardo [name]. Date of Service: 12/18/2017 1:00 PM Medical Record Number: [id number] Patient Account Number: [id number] Date of Birth/Sex: 04-08-1931 (84 y.o. M) Treating RN: [name] Primary Care Elijah Michaelis: [name] Other Clinician: Referring Ladesha Pacini: [name] Treating Chenae Brager/Extender: Tito Dine in Treatment: 5 Encounter Discharge Information Items Discharge Condition: Stable Ambulatory Status: Ambulatory Discharge Destination: Home Transportation: Private Auto Accompanied By: spouse Schedule Follow-up Appointment: Yes Clinical Summary of Care: Electronic Signature(s) Signed: 12/18/2017 2:00:00 PM By: [name] Entered By: [name] on 12/18/2017 13:59:59 Blann, Jamarkis M. ([id number]) -------------------------------------------------------------------------------- Lower Extremity Assessment Details Patient Name: Eduardo [name]. Date of Service: 12/18/2017 1:00 PM Medical Record Number: [id number] Patient Account Number: [id number] Date of Birth/Sex: Dec 06, 1930 (84 y.o. M) Treating RN: Ahmed Prima Primary Care Kierstan Auer: [name] Other Clinician: Referring Monserath Neff: [name] Treating Zynasia Burklow/Extender: Tito Dine in Treatment: 5 Edema Assessment Assessed: [Left: No] [Right: No] [Left: Edema] [Right: :] Calf Left: Right: Point of Measurement: 30 cm From Medial Instep cm 28.5 cm Ankle Left: Right: Point of Measurement: 10 cm From Medial Instep cm 20.7 cm Vascular Assessment Pulses: Dorsalis Pedis Palpable: [Right:Yes] Posterior Tibial Extremity colors, hair growth, and conditions: Extremity Color: [Right:Hyperpigmented] Temperature of Extremity: [Right:Warm] Capillary Refill: [Right:< 3 seconds] Toe Nail Assessment Left: Right: Thick: Yes Discolored: No Deformed: No Improper Length and Hygiene: No Electronic Signature(s) Signed: 12/18/2017 5:25:23 PM By:  Alric Quan Entered By: Alric Quan on 12/18/2017 13:07:13 Macauley, Teagen M. ([id number]) -------------------------------------------------------------------------------- Multi Wound Chart Details Patient Name: Eduardo [name]. Date of Service: 12/18/2017 1:00 PM Medical Record Number: [id number] Patient Account Number: [id number] Date of Birth/Sex: [birth year] (84 y.o. M) Treating RN: Roger Shelter Primary Care Tyrah Broers: [name] Other Clinician: Referring Jleigh Striplin: [name] Treating Irys Nigh/Extender: Tito Dine in Treatment: 5 Vital Signs Height(in): 62 Pulse(bpm): 77 Weight(lbs): 153.7 Blood Pressure(mmHg): 142/61 Body Mass Index(BMI): 28  Temperature(F): 98.0 Respiratory Rate 20 (breaths/min): Photos: [1:No Photos] [N/A:N/A] Wound Location: [1:Right Lower Leg] [N/A:N/A] Wounding Event: [1:Trauma] [N/A:N/A] Primary Etiology: [1:Trauma, Other] [N/A:N/A] Comorbid History: [1:Glaucoma, Chronic Obstructive Pulmonary Disease (COPD), Arrhythmia, Congestive Heart Failure, Coronary Artery Disease, Myocardial Infarction, Neuropathy] [N/A:N/A] Date Acquired: [1:10/15/2017] [N/A:N/A] Weeks of Treatment: [1:5] [N/A:N/A] Wound Status: [1:Open] [N/A:N/A] Measurements L x W x D [1:1.7x0.9x0.2] [N/A:N/A] (cm) Area (cm) : [1:1.202] [N/A:N/A] Volume (cm) : [1:0.24] [N/A:N/A] % Reduction in Area: [1:76.10%] [N/A:N/A] % Reduction in Volume: [1:88.10%] [N/A:N/A] Classification: [1:Full Thickness Without Exposed Support Structures] [N/A:N/A] Exudate Amount: [1:Large] [N/A:N/A] Exudate Type: [1:Serosanguineous] [N/A:N/A] Exudate Color: [1:red, brown] [N/A:N/A] Wound Margin: [1:Thickened] [N/A:N/A] Granulation Amount: [1:Large (67-100%)] [N/A:N/A] Granulation Quality: [1:Pink] [N/A:N/A] Necrotic Amount: [1:Small (1-33%)] [N/A:N/A] Exposed Structures: [1:Fat Layer (Subcutaneous Tissue) Exposed: Yes Fascia: No Tendon: No Muscle: No Joint: No  Bone: No] [N/A:N/A] Epithelialization: [1:None] [N/A:N/A] Periwound Skin Texture: Induration: Yes N/A N/A Excoriation: No Callus: No Crepitus: No Rash: No Scarring: No Periwound Skin Moisture: Maceration: No N/A N/A Dry/Scaly: No Periwound Skin Color: Erythema: Yes N/A N/A Atrophie Blanche: No Cyanosis: No Ecchymosis: No Hemosiderin Staining: No Mottled: No Pallor: No Rubor: No Erythema Location: Circumferential N/A N/A Temperature: No Abnormality N/A N/A Tenderness on Palpation: Yes N/A N/A Wound Preparation: Ulcer Cleansing: N/A N/A Rinsed/Irrigated with Saline, Other: soap and water Topical Anesthetic Applied: Other: lidocaine 4% Treatment Notes Wound #1 (Right Lower Leg) 1. Cleansed with: Cleanse wound with antibacterial soap and water 2. Anesthetic Topical Lidocaine 4% cream to wound bed prior to debridement 4. Dressing Applied: Prisma Ag 5. Secondary Dressing Applied ABD Pad 7. Secured with 3 Layer Compression System - Right Lower Extremity Electronic Signature(s) Signed: 12/18/2017 6:13:38 PM By: Linton Ham MD Entered By: Linton Ham on 12/18/2017 14:14:42 Titzer, Eduardo [name] ([id number]) -------------------------------------------------------------------------------- [place] Details Patient Name: Eduardo [name], Eduardo [name]. Date of Service: 12/18/2017 1:00 PM Medical Record Number: [id number] Patient Account Number: [id number] Date of Birth/Sex: 1930/11/09 (84 y.o. M) Treating RN: Roger Shelter Primary Care Loni Delbridge: [name] Other Clinician: Referring Verania Salberg: [name] Treating Kupono Marling/Extender: Tito Dine in Treatment: 5 Active Inactive ` Abuse / Safety / Falls / Self Care Management Nursing Diagnoses: Potential for falls Goals: Patient will remain injury free related to falls Date Initiated: 11/08/2017 Target Resolution Date: 01/17/2018 Goal Status: Active Interventions: Assess fall risk on  admission and as needed Notes: ` Orientation to the Wound Care Program Nursing Diagnoses: Knowledge deficit related to the wound healing center program Goals: Patient/caregiver will verbalize understanding of the [place] Program Date Initiated: 11/08/2017 Target Resolution Date: 01/17/2018 Goal Status: Active Interventions: Provide education on orientation to the wound center Notes: ` Wound/Skin Impairment Nursing Diagnoses: Impaired tissue integrity Goals: Ulcer/skin breakdown will heal within 14 weeks Date Initiated: 11/08/2017 Target Resolution Date: 01/17/2018 Goal Status: Active Interventions: Eduardo [name], Eduardo [name] ([id number]) Assess patient/caregiver ability to obtain necessary supplies Assess patient/caregiver ability to perform ulcer/skin care regimen upon admission and as needed Assess ulceration(s) every visit Notes: Electronic Signature(s) Signed: 12/18/2017 5:14:04 PM By: Roger Shelter Entered By: Roger Shelter on 12/18/2017 13:30:00 Alicea, [name] ([id number]) -------------------------------------------------------------------------------- Pain Assessment Details Patient Name: Eduardo [name]. Date of Service: 12/18/2017 1:00 PM Medical Record Number: [id number] Patient Account Number: [id number] Date of Birth/Sex: 1931/01/13 (84 y.o. M) Treating RN: Ahmed Prima Primary Care Devoiry Corriher: [name] Other Clinician: Referring Laylamarie Meuser: [name] Treating Eduardo [name]/Extender: Tito Dine in Treatment: 5 Active Problems Location of Pain Severity and Description of Pain Patient Has Paino  No Site Locations Pain Management and Medication Current Pain Management: Electronic Signature(s) Signed: 12/18/2017 5:25:23 PM By: Alric Quan Entered By: Alric Quan on 12/18/2017 12:58:07 Blanks, [name] ([id number]) -------------------------------------------------------------------------------- Patient/Caregiver  Education Details Patient Name: Eduardo [name]. Date of Service: 12/18/2017 1:00 PM Medical Record Number: [id number] Patient Account Number: [id number] Date of Birth/Gender: 06-19-1930 (84 y.o. M) Treating RN: [name] Primary Care Physician: [name] Other Clinician: Referring Physician: [name] Treating Physician/Extender: Tito Dine in Treatment: 5 Education Assessment Education Provided To: Patient and Caregiver Education Topics Provided Venous: Handouts: Other: leg elevation Methods: Explain/Verbal Responses: State content correctly Electronic Signature(s) Signed: 12/18/2017 5:21:12 PM By: [name] Entered By: [name] on 12/18/2017 14:01:21 [place], Eduardo [name]. ([id number]) -------------------------------------------------------------------------------- Wound Assessment Details Patient Name: Eduardo [name]. Date of Service: 12/18/2017 1:00 PM Medical Record Number: [id number] Patient Account Number: [id number] Date of Birth/Sex: 22-Jun-1930 (84 y.o. M) Treating RN: Ahmed Prima Primary Care Lupita Rosales: [name] Other Clinician: Referring Joellen Tullos: [name] Treating Inell Mimbs/Extender: Tito Dine in Treatment: 5 Wound Status Wound Number: 1 Primary Trauma, Other Etiology: Wound Location: Right Lower Leg Wound Open Wounding Event: Trauma Status: Date Acquired: 10/15/2017 Comorbid Glaucoma, Chronic Obstructive Pulmonary Weeks Of Treatment: 5 History: Disease (COPD), Arrhythmia, Congestive Heart Clustered Wound: No Failure, Coronary Artery Disease, Myocardial Infarction, Neuropathy Wound Measurements Length: (cm) 1.7 Width: (cm) 0.9 Depth: (cm) 0.2 Area: (cm) 1.202 Volume: (cm) 0.24 % Reduction in Area: 76.1% % Reduction in Volume: 88.1% Epithelialization: None Tunneling: No Undermining: No Wound Description Full Thickness Without Exposed Support Foul  Odo Classification: Structures Slough/F Wound Margin: Thickened Exudate Large Amount: Exudate Type: Serosanguineous Exudate Color: red, brown r After Cleansing: No ibrino Yes Wound Bed Granulation Amount: Large (67-100%) Exposed Structure Granulation Quality: Pink Fascia Exposed: No Necrotic Amount: Small (1-33%) Fat Layer (Subcutaneous Tissue) Exposed: Yes Necrotic Quality: Adherent Slough Tendon Exposed: No Muscle Exposed: No Joint Exposed: No Bone Exposed: No Periwound Skin Texture Texture Color No Abnormalities Noted: No No Abnormalities Noted: No Callus: No Atrophie Blanche: No Crepitus: No Cyanosis: No Excoriation: No Ecchymosis: No Induration: Yes Erythema: Yes Rash: No Erythema Location: Circumferential Scarring: No Hemosiderin Staining: No Mottled: No Moisture Pallor: No No Abnormalities Noted: No Barto, Koa M. ([id number]) Dry / Scaly: No Rubor: No Maceration: No Temperature / Pain Temperature: No Abnormality Tenderness on Palpation: Yes Wound Preparation Ulcer Cleansing: Rinsed/Irrigated with Saline, Other: soap and water, Topical Anesthetic Applied: Other: lidocaine 4%, Treatment Notes Wound #1 (Right Lower Leg) 1. Cleansed with: Cleanse wound with antibacterial soap and water 2. Anesthetic Topical Lidocaine 4% cream to wound bed prior to debridement 4. Dressing Applied: Prisma Ag 5. Secondary Dressing Applied ABD Pad 7. Secured with 3 Layer Compression System - Right Lower Extremity Electronic Signature(s) Signed: 12/18/2017 5:25:23 PM By: Alric Quan Entered By: Alric Quan on 12/18/2017 13:08:59 Vanwinkle, Johnston M. ([id number]) -------------------------------------------------------------------------------- [place] Details Patient Name: Eduardo [name]. Date of Service: 12/18/2017 1:00 PM Medical Record Number: [id number] Patient Account Number: [id number] Date of Birth/Sex: [birth year] (84 y.o. M) Treating RN:  Ahmed Prima Primary Care Teletha Petrea: [name] Other Clinician: Referring Leanord Thibeau: [name] Treating Shenaya Lebo/Extender: Tito Dine in Treatment: 5 Vital Signs Time Taken: 12:58 Temperature (F): 98.0 Height (in): 62 Pulse (bpm): 77 Weight (lbs): 153.7 Respiratory Rate (breaths/min): 20 Body Mass Index (BMI): 28.1 Blood Pressure (mmHg): 142/61 Reference Range: 80 - 120 mg / dl Electronic Signature(s) Signed: 12/18/2017 5:25:23 PM By: Alric Quan Entered By: Alric Quan on 12/18/2017  13:00:08 

## 2017-12-19 NOTE — Progress Notes (Signed)
UDAY, JANTZ Dinwiddie. ([id number]) Visit Report for 12/18/2017 HPI Details Patient Name: Eduardo [name], Eduardo [name]. Date of Service: 12/18/2017 1:00 PM Medical Record Number: [id number] Patient Account Number: [id number] Date of Birth/Sex: April 04, 1931 (84 y.o. M) Treating RN: Roger Shelter Primary Care Provider: [name] Other Clinician: Referring Provider: [name] Treating Provider/Extender: Tito Dine in Treatment: 5 History of Present Illness HPI Description: 11/08/17 on evaluation today patient presents initially concerning a right dramatic lower extremity ulcer/skin tear which occurred three weeks ago. He states that he initially was seen in urgent care where the area was sutured closed. The sutures stayed in for two weeks he tells me. Subsequently he was seen at Dr. Alla German office where the sutures were removed and Steri-Strips were placed over the next five days. Subsequently these were also removed and unfortunately at this time it was noted that the patient had a dehisced larger wound that required further attention. The patient and his wife actually requested referral to the wound care center in [place]. [name] made the referral to [place]. Subsequently the patient does have a history of atrial fibrillation, cardiovascular disease, COPD, stroke, and is on long-term Eliquis. There's no history of diabetes that he states. Subsequently he has no other major medical problems currently. No fevers, chills, nausea, or vomiting noted at this time. 11/14/17-He is seen in follow-up evaluation for right posterior lower extremity ulcer. There is improvement with granulation tissue present; debridement. We will continue with same treatment plan he'll follow-up next week. The vascular office did call to establish an appointment, but he forgot that we had referred him and did not make an appointment. He was encouraged to contact the office to schedule an appointment for  evaluation. 11/22/17 on evaluation today patient actually appears to be doing well in regard to his ulcer. This has cleaned up very nicely in my pinion I do believe the Iodoflex has been of benefit for him. He actually does have his arterial study which is scheduled for 11/27/17. In general he's having no significant discomfort mainly just with cleansing of the wound but the dressings themselves don't seem to be causing him problems. 11/29/17 on evaluation today patient appears to be doing very well in regard to his lower extremity ulcer. With that being said he unfortunately prior to coming in for evaluation today did have a trip over a world where he failed hurting his back and striking his head. With that being said he has not had any altered mental status since that time and states that he's not really any more dizzy and there does not appear to be any residual effect from this. He also does not have a significant headache. With that being said this is something I did check out for him today briefly at least since he was in our office. I do think that he needs to look out for any signs or symptoms of anything worsening such as nausea, vomiting, fever, chills, confusion, or severe headaches. He has no visual disturbance at this point. 12/06/17 on evaluation today patient appears to be doing excellent in regard to his lower extremity ulcer. This is starting to really fill in which is good news as well. We do have the results of the arterial study unfortunately they did not perform the TBI portion of the examination and his ABI's were noncompressible. With that being said they did state that he had triphasic blood flow throughout the bilateral lower extremities and there was apparently no evidence of arterial disease on  the arterial ultrasound. Overall this is good news we will be able to initiate stronger compression therapy which should help to allow this wound to heal even faster as the patient  still continues to have some pitting edema. 12/10/17 on evaluation today patient appears to be doing much better in regard to his lower extremity ulcer. He has been tolerating the dressing changes without complication. Fortunately the compression wrap seems to be doing excellent at this time and overall he is showing signs of good improvement. I'm happy with the overall progress that has been made over the past several weeks but especially in the past week. The patient and his wife both are extremely pleased. 12/18/17; right posterior calf ulcer. Healthy granulation. Dimension slightly smaller. Using silver collagen under compression Electronic Signature(s) Signed: 12/18/2017 6:13:38 PM By: Linton Ham MD Eduardo [name], [name] ([id number]) Entered By: Linton Ham on 12/18/2017 14:15:14 Eduardo [name], Eduardo [name] ([id number]) -------------------------------------------------------------------------------- Physical Exam Details Patient Name: Eduardo [name], Eduardo [name]. Date of Service: 12/18/2017 1:00 PM Medical Record Number: [id number] Patient Account Number: [id number] Date of Birth/Sex: 1930-06-26 (84 y.o. M) Treating RN: Roger Shelter Primary Care Provider: [name] Other Clinician: Referring Provider: [name] Treating Provider/Extender: Tito Dine in Treatment: 5 Notes wound exam; no debridement required. Wound bed was healthy looking. Still some raised edges but I didn't feel that needed to breathing today. Electronic Signature(s) Signed: 12/18/2017 6:13:38 PM By: Linton Ham MD Entered By: Linton Ham on 12/18/2017 14:15:54 Eduardo [name], Eduardo [name] ([id number]) -------------------------------------------------------------------------------- Physician Orders Details Patient Name: Eduardo [name]. Date of Service: 12/18/2017 1:00 PM Medical Record Number: [id number] Patient Account Number: [id number] Date of Birth/Sex: [birth year] (84 y.o. M) Treating RN:  Roger Shelter Primary Care Provider: [name] Other Clinician: Referring Provider: [name] Treating Provider/Extender: Tito Dine in Treatment: 5 Verbal / Phone Orders: No Diagnosis Coding Wound Cleansing Wound #1 Right Lower Leg o Clean wound with Normal Saline. o May shower with protection. - DO NOT GET WRAP WET Anesthetic (add to Medication List) Wound #1 Right Lower Leg o Topical Lidocaine 4% cream applied to wound bed prior to debridement (In Clinic Only). Primary Wound Dressing Wound #1 Right Lower Leg o Silver Collagen Secondary Dressing Wound #1 Right Lower Leg o ABD pad Dressing Change Frequency Wound #1 Right Lower Leg o Change dressing every week Follow-up Appointments Wound #1 Right Lower Leg o Return Appointment in 1 week. - Tuesday 12/10/17 Edema Control Wound #1 Right Lower Leg o 3 Layer Compression System - Right Lower Extremity - unna to anchor Additional Orders / Instructions Wound #1 Right Lower Leg o Vitamin A; Vitamin C, Zinc - Please add a multivitamin that has 100% daily value of vitamin A, vitamin C and zinc supplements o Increase protein intake. Electronic Signature(s) Signed: 12/18/2017 5:14:04 PM By: Roger Shelter Signed: 12/18/2017 6:13:38 PM By: Linton Ham MD Entered By: Roger Shelter on 12/18/2017 13:30:44 Eduardo [name], [name] ([id number]) [address], [place] M. ([id number]) -------------------------------------------------------------------------------- Problem List Details Patient Name: Eduardo [name], Eduardo [name]. Date of Service: 12/18/2017 1:00 PM Medical Record Number: [id number] Patient Account Number: [id number] Date of Birth/Sex: 1930-12-12 (84 y.o. M) Treating RN: Roger Shelter Primary Care Provider: [name] Other Clinician: Referring Provider: [name] Treating Provider/Extender: Tito Dine in Treatment: 5 Active Problems ICD-10 Evaluated  Encounter Code Description Active Date Today Diagnosis [phone] Non-pressure chronic ulcer of other part of right lower leg 11/08/2017 Yes Yes with fat layer exposed Status Complications Interventions Medical Improving wound bed is doing nicely  on the posterior calf. Healthy continue silver Decision base collagen under 3 Making : layer compression I48.0 Paroxysmal atrial fibrillation 11/08/2017 No Yes Z79.01 Long term (current) use of anticoagulants 11/08/2017 No Yes J44.9 Chronic obstructive pulmonary disease, unspecified 11/08/2017 No Yes I25.10 Atherosclerotic heart disease of native coronary artery 11/08/2017 No Yes without angina pectoris Inactive Problems Resolved Problems Electronic Signature(s) Signed: 12/18/2017 6:13:38 PM By: Linton Ham MD Entered By: Linton Ham on 12/18/2017 14:14:33 Eduardo [name], [name] ([id number]) -------------------------------------------------------------------------------- Progress Note Details Patient Name: Eduardo [name]. Date of Service: 12/18/2017 1:00 PM Medical Record Number: [id number] Patient Account Number: [id number] Date of Birth/Sex: 28-Apr-1931 (84 y.o. M) Treating RN: Roger Shelter Primary Care Provider: [name] Other Clinician: Referring Provider: [name] Treating Provider/Extender: Tito Dine in Treatment: 5 Subjective History of Present Illness (HPI) 11/08/17 on evaluation today patient presents initially concerning a right dramatic lower extremity ulcer/skin tear which occurred three weeks ago. He states that he initially was seen in urgent care where the area was sutured closed. The sutures stayed in for two weeks he tells me. Subsequently he was seen at Dr. Alla German office where the sutures were removed and Steri-Strips were placed over the next five days. Subsequently these were also removed and unfortunately at this time it was noted that the patient had a dehisced larger wound that  required further attention. The patient and his wife actually requested referral to the wound care center in [place]. [name] made the referral to [place]. Subsequently the patient does have a history of atrial fibrillation, cardiovascular disease, COPD, stroke, and is on long-term Eliquis. There's no history of diabetes that he states. Subsequently he has no other major medical problems currently. No fevers, chills, nausea, or vomiting noted at this time. 11/14/17-He is seen in follow-up evaluation for right posterior lower extremity ulcer. There is improvement with granulation tissue present; debridement. We will continue with same treatment plan he'll follow-up next week. The vascular office did call to establish an appointment, but he forgot that we had referred him and did not make an appointment. He was encouraged to contact the office to schedule an appointment for evaluation. 11/22/17 on evaluation today patient actually appears to be doing well in regard to his ulcer. This has cleaned up very nicely in my pinion I do believe the Iodoflex has been of benefit for him. He actually does have his arterial study which is scheduled for 11/27/17. In general he's having no significant discomfort mainly just with cleansing of the wound but the dressings themselves don't seem to be causing him problems. 11/29/17 on evaluation today patient appears to be doing very well in regard to his lower extremity ulcer. With that being said he unfortunately prior to coming in for evaluation today did have a trip over a world where he failed hurting his back and striking his head. With that being said he has not had any altered mental status since that time and states that he's not really any more dizzy and there does not appear to be any residual effect from this. He also does not have a significant headache. With that being said this is something I did check out for him today briefly at least since he was in our office. I do  think that he needs to look out for any signs or symptoms of anything worsening such as nausea, vomiting, fever, chills, confusion, or severe headaches. He has no visual disturbance at this point. 12/06/17 on evaluation today patient appears to  be doing excellent in regard to his lower extremity ulcer. This is starting to really fill in which is good news as well. We do have the results of the arterial study unfortunately they did not perform the TBI portion of the examination and his ABI's were noncompressible. With that being said they did state that he had triphasic blood flow throughout the bilateral lower extremities and there was apparently no evidence of arterial disease on the arterial ultrasound. Overall this is good news we will be able to initiate stronger compression therapy which should help to allow this wound to heal even faster as the patient still continues to have some pitting edema. 12/10/17 on evaluation today patient appears to be doing much better in regard to his lower extremity ulcer. He has been tolerating the dressing changes without complication. Fortunately the compression wrap seems to be doing excellent at this time and overall he is showing signs of good improvement. I'm happy with the overall progress that has been made over the past several weeks but especially in the past week. The patient and his wife both are extremely pleased. 12/18/17; right posterior calf ulcer. Healthy granulation. Dimension slightly smaller. Using silver collagen under compression Eduardo [name], [name] ([id number]) Objective Constitutional Vitals Time Taken: 12:58 PM, Height: 62 in, Weight: 153.7 lbs, BMI: 28.1, Temperature: 98.0 F, Pulse: 77 bpm, Respiratory Rate: 20 breaths/min, Blood Pressure: 142/61 mmHg. Integumentary (Hair, Skin) Wound #1 status is Open. Original cause of wound was Trauma. The wound is located on the Right Lower Leg. The wound measures 1.7cm length x 0.9cm width x 0.2cm  depth; 1.202cm^2 area and 0.24cm^3 volume. There is Fat Layer (Subcutaneous Tissue) Exposed exposed. There is no tunneling or undermining noted. There is a large amount of serosanguineous drainage noted. The wound margin is thickened. There is large (67-100%) pink granulation within the wound bed. There is a small (1-33%) amount of necrotic tissue within the wound bed including Adherent Slough. The periwound skin appearance exhibited: Induration, Erythema. The periwound skin appearance did not exhibit: Callus, Crepitus, Excoriation, Rash, Scarring, Dry/Scaly, Maceration, Atrophie Blanche, Cyanosis, Ecchymosis, Hemosiderin Staining, Mottled, Pallor, Rubor. The surrounding wound skin color is noted with erythema which is circumferential. Periwound temperature was noted as No Abnormality. The periwound has tenderness on palpation. Assessment Active Problems ICD-10 Non-pressure chronic ulcer of other part of right lower leg with fat layer exposed Paroxysmal atrial fibrillation Long term (current) use of anticoagulants Chronic obstructive pulmonary disease, unspecified Atherosclerotic heart disease of native coronary artery without angina pectoris Plan Wound Cleansing: Wound #1 Right Lower Leg: Clean wound with Normal Saline. May shower with protection. - DO NOT GET WRAP WET Anesthetic (add to Medication List): Wound #1 Right Lower Leg: Topical Lidocaine 4% cream applied to wound bed prior to debridement (In Clinic Only). Primary Wound Dressing: Wound #1 Right Lower Leg: Silver Collagen Secondary Dressing: Wound #1 Right Lower Leg: ABD pad Dressing Change Frequency: Wound #1 Right Lower Leg: Change dressing every week Eduardo [name], Eduardo Sachin M. ([id number]) Follow-up Appointments: Wound #1 Right Lower Leg: Return Appointment in 1 week. - Tuesday 12/10/17 Edema Control: Wound #1 Right Lower Leg: 3 Layer Compression System - Right Lower Extremity - unna to anchor Additional Orders /  Instructions: Wound #1 Right Lower Leg: Vitamin A; Vitamin C, Zinc - Please add a multivitamin that has 100% daily value of vitamin A, vitamin C and zinc supplements Increase protein intake. Medical Decision Making Non-pressure chronic ulcer of other part of right lower leg with fat layer  exposed 11/08/2017 Status: Improving Complications: wound bed is doing nicely on the posterior calf. Healthy base Interventions: continue silver collagen under 3 layer compression #1 continue silver collagen under 3 alert compression. He appears to be doing well. I did not change any of the orders Electronic Signature(s) Signed: 12/18/2017 6:13:38 PM By: Linton Ham MD Entered By: Linton Ham on 12/18/2017 14:16:34 Eduardo [name], Eutimio M. ([id number]) -------------------------------------------------------------------------------- [place] Details Patient Name: Eduardo [name]. Date of Service: 12/18/2017 Medical Record Number: [id number] Patient Account Number: [id number] Date of Birth/Sex: June 07, 1931 (84 y.o. M) Treating RN: Roger Shelter Primary Care Provider: [name] Other Clinician: Referring Provider: [name] Treating Provider/Extender: Tito Dine in Treatment: 5 Diagnosis Coding ICD-10 Codes Code Description [phone] Non-pressure chronic ulcer of other part of right lower leg with fat layer exposed I48.0 Paroxysmal atrial fibrillation Z79.01 Long term (current) use of anticoagulants J44.9 Chronic obstructive pulmonary disease, unspecified I25.10 Atherosclerotic heart disease of native coronary artery without angina pectoris Facility Procedures CPT4 Code: [id number] Description: [phone] - WOUND CARE VISIT-LEV 2 EST PT Modifier: Quantity: 1 Physician Procedures CPT4 Code Description: [id number] - WC PHYS LEVEL 2 - EST PT ICD-10 Diagnosis Description L97.812 Non-pressure chronic ulcer of other part of right lower leg w Modifier: ith fat layer  expo Quantity: 1 sed Electronic Signature(s) Signed: 12/18/2017 6:13:38 PM By: Linton Ham MD Entered By: Linton Ham on 12/18/2017 14:17:05

## 2017-12-20 ENCOUNTER — Encounter: Payer: Self-pay | Admitting: Emergency Medicine

## 2017-12-20 ENCOUNTER — Other Ambulatory Visit: Payer: Self-pay

## 2017-12-20 ENCOUNTER — Encounter: Payer: Self-pay | Admitting: Internal Medicine

## 2017-12-20 MED ORDER — MONTELUKAST SODIUM 10 MG PO TABS: 10.0000 mg | ORAL_TABLET | Freq: Every day | ORAL | 3 refills | Status: DC

## 2017-12-23 MED ORDER — HYDROCODONE-ACETAMINOPHEN 5-325 MG PO TABS: 1.0000 | ORAL_TABLET | ORAL | 0 refills | Status: DC | PRN

## 2017-12-23 NOTE — Telephone Encounter (Signed)
Name of Medication: Hydrocodone Name of Pharmacy: [facility] or Written Date and Quantity: 12-11-17 #30 Last Office Visit and Type: 12-11-17 Next Office Visit and Type: 06-26-18 Last Controlled Substance Agreement Date: None  Last UDS: 12-11-17

## 2017-12-24 ENCOUNTER — Encounter: Payer: PPO | Admitting: Physician Assistant

## 2017-12-24 DIAGNOSIS — M5136 Other intervertebral disc degeneration, lumbar region: Secondary | ICD-10-CM | POA: Diagnosis not present

## 2017-12-24 DIAGNOSIS — S32010D Wedge compression fracture of first lumbar vertebra, subsequent encounter for fracture with routine healing: Secondary | ICD-10-CM | POA: Diagnosis not present

## 2017-12-24 DIAGNOSIS — L97812 Non-pressure chronic ulcer of other part of right lower leg with fat layer exposed: Secondary | ICD-10-CM | POA: Diagnosis not present

## 2017-12-24 DIAGNOSIS — L97212 Non-pressure chronic ulcer of right calf with fat layer exposed: Secondary | ICD-10-CM | POA: Diagnosis not present

## 2017-12-24 DIAGNOSIS — M48062 Spinal stenosis, lumbar region with neurogenic claudication: Secondary | ICD-10-CM | POA: Diagnosis not present

## 2017-12-24 DIAGNOSIS — M5416 Radiculopathy, lumbar region: Secondary | ICD-10-CM | POA: Diagnosis not present

## 2017-12-25 NOTE — Progress Notes (Signed)
Eduardo [name] ([id number]) Visit Report for 12/24/2017 Arrival Information Details Patient Name: Eduardo [name], Eduardo [name]. Date of Service: 12/24/2017 3:30 PM Medical Record Number: [id number] Patient Account Number: [id number] Date of Birth/Sex: 16-Mar-1931 (84 y.o. M) Treating RN: Secundino Ginger Primary Care Nirali Magouirk: [name] Other Clinician: Referring Gilbert Narain: [name] Treating Alexah Kivett/Extender: Melburn Hake, HOYT Weeks in Treatment: 6 Visit Information History Since Last Visit Added or deleted any medications: No Patient Arrived: Walker Any new allergies or adverse reactions: No Arrival Time: 15:29 Had a fall or experienced change in No Accompanied By: family activities of daily living that may affect Transfer Assistance: None risk of falls: Patient Identification Verified: Yes Signs or symptoms of abuse/neglect since last visito No Secondary Verification Process Yes Hospitalized since last visit: No Completed: Implantable device outside of the clinic excluding No Patient Requires Transmission-Based No cellular tissue based products placed in the center Precautions: since last visit: Patient Has Alerts: Yes Has Dressing in Place as Prescribed: Yes Patient Alerts: Patient on Blood Pain Present Now: No Thinner Eliquis 11/08/17 ABI >220 Biateral Electronic Signature(s) Signed: 12/24/2017 4:22:54 PM By: Secundino Ginger Entered By: Secundino Ginger on 12/24/2017 15:29:46 Wurth, Anas M. ([id number]) -------------------------------------------------------------------------------- Clinic Level of Care Assessment Details Patient Name: Eduardo [name]. Date of Service: 12/24/2017 3:30 PM Medical Record Number: [id number] Patient Account Number: [id number] Date of Birth/Sex: April 29, 1931 (84 y.o. M) Treating RN: Roger Shelter Primary Care Nimrat Woolworth: [name] Other Clinician: Referring Daylen Lipsky: [name] Treating Duron Meister/Extender: Melburn Hake, HOYT Weeks in  Treatment: 6 Clinic Level of Care Assessment Items TOOL 4 Quantity Score X - Use when only an EandM is performed on FOLLOW-UP visit 1 0 ASSESSMENTS - Nursing Assessment / Reassessment X - Reassessment of Co-morbidities (includes updates in patient status) 1 10 X- 1 5 Reassessment of Adherence to Treatment Plan ASSESSMENTS - Wound and Skin Assessment / Reassessment X - Simple Wound Assessment / Reassessment - one wound 1 5 []  - 0 Complex Wound Assessment / Reassessment - multiple wounds []  - 0 Dermatologic / Skin Assessment (not related to wound area) ASSESSMENTS - Focused Assessment []  - Circumferential Edema Measurements - multi extremities 0 []  - 0 Nutritional Assessment / Counseling / Intervention []  - 0 Lower Extremity Assessment (monofilament, tuning fork, pulses) []  - 0 Peripheral Arterial Disease Assessment (using hand held doppler) ASSESSMENTS - Ostomy and/or Continence Assessment and Care []  - Incontinence Assessment and Management 0 []  - 0 Ostomy Care Assessment and Management (repouching, etc.) PROCESS - Coordination of Care X - Simple Patient / Family Education for ongoing care 1 15 []  - 0 Complex (extensive) Patient / Family Education for ongoing care []  - 0 Staff obtains Programmer, systems, Records, Test Results / Process Orders []  - 0 Staff telephones HHA, Nursing Homes / Clarify orders / etc []  - 0 Routine Transfer to another Facility (non-emergent condition) []  - 0 Routine Hospital Admission (non-emergent condition) []  - 0 New Admissions / Biomedical engineer / Ordering NPWT, Apligraf, etc. []  - 0 Emergency Hospital Admission (emergent condition) X- 1 10 Simple Discharge Coordination Hambly, Juelz M. ([id number]) []  - 0 Complex (extensive) Discharge Coordination PROCESS - Special Needs []  - Pediatric / Minor Patient Management 0 []  - 0 Isolation Patient Management []  - 0 Hearing / Language / Visual special needs []  - 0 Assessment of Community  assistance (transportation, D/C planning, etc.) []  - 0 Additional assistance / Altered mentation []  - 0 Support Surface(s) Assessment (bed, cushion, seat, etc.) INTERVENTIONS - Wound Cleansing / Measurement X - Simple  Wound Cleansing - one wound 1 5 []  - 0 Complex Wound Cleansing - multiple wounds X- 1 5 Wound Imaging (photographs - any number of wounds) []  - 0 Wound Tracing (instead of photographs) X- 1 5 Simple Wound Measurement - one wound []  - 0 Complex Wound Measurement - multiple wounds INTERVENTIONS - Wound Dressings X - Small Wound Dressing one or multiple wounds 1 10 []  - 0 Medium Wound Dressing one or multiple wounds []  - 0 Large Wound Dressing one or multiple wounds []  - 0 Application of Medications - topical []  - 0 Application of Medications - injection INTERVENTIONS - Miscellaneous []  - External ear exam 0 []  - 0 Specimen Collection (cultures, biopsies, blood, body fluids, etc.) []  - 0 Specimen(s) / Culture(s) sent or taken to Lab for analysis []  - 0 Patient Transfer (multiple staff / Civil Service fast streamer / Similar devices) []  - 0 Simple Staple / Suture removal (25 or less) []  - 0 Complex Staple / Suture removal (26 or more) []  - 0 Hypo / Hyperglycemic Management (close monitor of Blood Glucose) []  - 0 Ankle / Brachial Index (ABI) - do not check if billed separately X- 1 5 Vital Signs Mamula, Bradyn M. ([id number]) Has the patient been seen at the hospital within the last three years: Yes Total Score: 75 Level Of Care: New/Established - Level 2 Electronic Signature(s) Signed: 12/24/2017 4:58:15 PM By: Roger Shelter Entered By: Roger Shelter on 12/24/2017 16:28:24 Dry, Ewel Jerilynn Mages ([id number]) -------------------------------------------------------------------------------- Encounter Discharge Information Details Patient Name: Eduardo [name]. Date of Service: 12/24/2017 3:30 PM Medical Record Number: [id number] Patient Account Number:  [id number] Date of Birth/Sex: 09-Oct-1930 (84 y.o. M) Treating RN: Roger Shelter Primary Care Shabree Tebbetts: [name] Other Clinician: Referring Neveah Bang: [name] Treating Camillo Quadros/Extender: Melburn Hake, HOYT Weeks in Treatment: 6 Encounter Discharge Information Items Discharge Condition: Stable Ambulatory Status: Walker Discharge Destination: Home Transportation: Private Auto Schedule Follow-up Appointment: Yes Clinical Summary of Care: Electronic Signature(s) Signed: 12/24/2017 4:58:15 PM By: Roger Shelter Entered By: Roger Shelter on 12/24/2017 16:29:13 Castilleja, Bennett Jerilynn Mages ([id number]) -------------------------------------------------------------------------------- Lower Extremity Assessment Details Patient Name: Eduardo [name]. Date of Service: 12/24/2017 3:30 PM Medical Record Number: [id number] Patient Account Number: [id number] Date of Birth/Sex: January 05, 1931 (84 y.o. M) Treating RN: Secundino Ginger Primary Care Council [place]ung[place]: [name] Other Clinician: Referring Clevie Prout: [name] Treating Lilybelle Mayeda/Extender: Melburn Hake, HOYT Weeks in Treatment: 6 Edema Assessment Assessed: [name]: No] [name]: No] [Left: Edema] [Right: :] Calf Left: Right: Point of Measurement: 30 cm From Medial Instep 27 cm 26.5 cm Ankle Left: Right: Point of Measurement: 10 cm From Medial Instep 20 cm 18 cm Vascular Assessment Claudication: Claudication Assessment [Left:None] [Right:None] Pulses: Dorsalis Pedis Palpable: [Left:Yes] [Right:Yes] Posterior Tibial Extremity colors, hair growth, and conditions: Extremity Color: [Left:Normal] [Right:Normal] Temperature of Extremity: [Left:Warm] [Right:Warm] Capillary Refill: [Left:< 3 seconds] [Right:< 3 seconds] Toe Nail Assessment Left: Right: Thick: No No Discolored: No No Deformed: No No Improper Length and Hygiene: No No Electronic Signature(s) Signed: 12/24/2017 4:22:54 PM By: Secundino Ginger Entered By: Secundino Ginger on  12/24/2017 15:52:50 Rothery, Daemian M. ([id number]) -------------------------------------------------------------------------------- Multi Wound Chart Details Patient Name: Eduardo [name]. Date of Service: 12/24/2017 3:30 PM Medical Record Number: [id number] Patient Account Number: [id number] Date of Birth/Sex: 1931-05-20 (84 y.o. M) Treating RN: Roger Shelter Primary Care Harlene Petralia: [name] Other Clinician: Referring Adina Puzzo: [name] Treating Clements Toro/Extender: Melburn Hake, HOYT Weeks in Treatment: 6 Vital Signs Height(in): 62 Pulse(bpm): 81 Weight(lbs): 153.7 Blood Pressure(mmHg): 141/60 Body Mass Index(BMI): 28 Temperature(F): 97.9 Respiratory  Rate 18 (breaths/min): Photos: [1:No Photos] [N/A:N/A] Wound Location: [1:Right Lower Leg] [N/A:N/A] Wounding Event: [1:Trauma] [N/A:N/A] Primary Etiology: [1:Trauma, Other] [N/A:N/A] Comorbid History: [1:Glaucoma, Chronic Obstructive Pulmonary Disease (COPD), Arrhythmia, Congestive Heart Failure, Coronary Artery Disease, Myocardial Infarction, Neuropathy] [N/A:N/A] Date Acquired: [1:10/15/2017] [N/A:N/A] Weeks of Treatment: [1:6] [N/A:N/A] Wound Status: [1:Open] [N/A:N/A] Measurements L x W x D [1:1.5x0.5x0.2] [N/A:N/A] (cm) Area (cm) : [1:0.589] [N/A:N/A] Volume (cm) : [1:0.118] [N/A:N/A] % Reduction in Area: [1:88.30%] [N/A:N/A] % Reduction in Volume: [1:94.10%] [N/A:N/A] Classification: [1:Full Thickness Without Exposed Support Structures] [N/A:N/A] Exudate Amount: [1:Large] [N/A:N/A] Exudate Type: [1:Serosanguineous] [N/A:N/A] Exudate Color: [1:red, brown] [N/A:N/A] Wound Margin: [1:Thickened] [N/A:N/A] Granulation Amount: [1:Small (1-33%)] [N/A:N/A] Granulation Quality: [1:Pink] [N/A:N/A] Necrotic Amount: [1:Small (1-33%)] [N/A:N/A] Exposed Structures: [1:Fat Layer (Subcutaneous Tissue) Exposed: Yes Fascia: No Tendon: No Muscle: No Joint: No Bone: No] [N/A:N/A] Epithelialization: [1:None]  [N/A:N/A] Periwound Skin Texture: Induration: Yes N/A N/A Excoriation: No Callus: No Crepitus: No Rash: No Scarring: No Periwound Skin Moisture: Maceration: No N/A N/A Dry/Scaly: No Periwound Skin Color: Erythema: Yes N/A N/A Atrophie Blanche: No Cyanosis: No Ecchymosis: No Hemosiderin Staining: No Mottled: No Pallor: No Rubor: No Erythema Location: Circumferential N/A N/A Temperature: No Abnormality N/A N/A Tenderness on Palpation: Yes N/A N/A Wound Preparation: Ulcer Cleansing: N/A N/A Rinsed/Irrigated with Saline, Other: soap and water Topical Anesthetic Applied: Other: lidocaine 4% Treatment Notes Electronic Signature(s) Signed: 12/24/2017 4:58:15 PM By: Roger Shelter Entered By: Roger Shelter on 12/24/2017 16:13:56 Endicott, Aodhan Jerilynn Mages ([id number]) -------------------------------------------------------------------------------- Multi-Disciplinary Care Plan Details Patient Name: Eduardo [name]. Date of Service: 12/24/2017 3:30 PM Medical Record Number: [id number] Patient Account Number: [id number] Date of Birth/Sex: [birth year] (84 y.o. M) Treating RN: Roger Shelter Primary Care Tayelor Osborne: [name] Other Clinician: Referring Teagan Heidrick: [name] Treating Salina Stanfield/Extender: Melburn Hake, HOYT Weeks in Treatment: 6 Active Inactive ` Abuse / Safety / Falls / Self Care Management Nursing Diagnoses: Potential for falls Goals: Patient will remain injury free related to falls Date Initiated: 11/08/2017 Target Resolution Date: 01/17/2018 Goal Status: Active Interventions: Assess fall risk on admission and as needed Notes: ` Orientation to the Wound Care Program Nursing Diagnoses: Knowledge deficit related to the wound healing center program Goals: Patient/caregiver will verbalize understanding of the [place] Program Date Initiated: 11/08/2017 Target Resolution Date: 01/17/2018 Goal Status: Active Interventions: Provide education  on orientation to the wound center Notes: ` Wound/Skin Impairment Nursing Diagnoses: Impaired tissue integrity Goals: Ulcer/skin breakdown will heal within 14 weeks Date Initiated: 11/08/2017 Target Resolution Date: 01/17/2018 Goal Status: Active Interventions: CONWAY, FEDORA ([id number]) Assess patient/caregiver ability to obtain necessary supplies Assess patient/caregiver ability to perform ulcer/skin care regimen upon admission and as needed Assess ulceration(s) every visit Notes: Electronic Signature(s) Signed: 12/24/2017 4:58:15 PM By: Roger Shelter Entered By: Roger Shelter on 12/24/2017 16:13:48 Ancheta, Damoni M. ([id number]) -------------------------------------------------------------------------------- Pain Assessment Details Patient Name: Eduardo [name]. Date of Service: 12/24/2017 3:30 PM Medical Record Number: [id number] Patient Account Number: [id number] Date of Birth/Sex: January 27, 1931 (84 y.o. M) Treating RN: Secundino Ginger Primary Care Janecia Palau: [name] Other Clinician: Referring Jden Want: [name] Treating Hadar Elgersma/Extender: Melburn Hake, HOYT Weeks in Treatment: 6 Active Problems Location of Pain Severity and Description of Pain Patient Has Paino No Site Locations Pain Management and Medication Current Pain Management: Goals for Pain Management pt denies any pain Electronic Signature(s) Signed: 12/24/2017 4:22:54 PM By: Secundino Ginger Entered By: Secundino Ginger on 12/24/2017 15:30:20 Martes, Kinsler Jerilynn Mages ([id number]) -------------------------------------------------------------------------------- Patient/Caregiver Education Details Patient Name: Eduardo [name]. Date of Service: 12/24/2017 3:30 PM  Medical Record Number: [id number] Patient Account Number: [id number] Date of Birth/Gender: 09-Jan-1931 (84 y.o. M) Treating RN: Roger Shelter Primary Care Physician: [name] Other Clinician: Referring Physician: [name] Treating  Physician/Extender: Sharalyn Ink in Treatment: 6 Education Assessment Education Provided To: Patient Education Topics Provided Wound/Skin Impairment: Methods: Explain/Verbal Responses: State content correctly Electronic Signature(s) Signed: 12/24/2017 4:58:15 PM By: Roger Shelter Entered By: Roger Shelter on 12/24/2017 16:29:24 Sarratt, Mansa M. ([id number]) -------------------------------------------------------------------------------- Wound Assessment Details Patient Name: Eduardo [name]. Date of Service: 12/24/2017 3:30 PM Medical Record Number: [id number] Patient Account Number: [id number] Date of Birth/Sex: July 01, 1930 (84 y.o. M) Treating RN: Secundino Ginger Primary Care Damario Gillie: [name] Other Clinician: Referring Cambridge Deleo: [name] Treating Jianni Batten/Extender: Melburn Hake, HOYT Weeks in Treatment: 6 Wound Status Wound Number: 1 Primary Trauma, Other Etiology: Wound Location: Right Lower Leg Wound Open Wounding Event: Trauma Status: Date Acquired: 10/15/2017 Comorbid Glaucoma, Chronic Obstructive Pulmonary Weeks Of Treatment: 6 History: Disease (COPD), Arrhythmia, Congestive Heart Clustered Wound: No Failure, Coronary Artery Disease, Myocardial Infarction, Neuropathy Photos Wound Measurements Length: (cm) 1.5 Width: (cm) 0.5 Depth: (cm) 0.2 Area: (cm) 0.589 Volume: (cm) 0.118 % Reduction in Area: 88.3% % Reduction in Volume: 94.1% Epithelialization: None Tunneling: No Undermining: No Wound Description Full Thickness Without Exposed Support Foul Odo Classification: Structures Slough/F Wound Margin: Thickened Exudate Large Amount: Exudate Type: Serosanguineous Exudate Color: red, brown r After Cleansing: No ibrino Yes Wound Bed Granulation Amount: Small (1-33%) Exposed Structure Granulation Quality: Pink Fascia Exposed: No Necrotic Amount: Small (1-33%) Fat Layer (Subcutaneous Tissue) Exposed: Yes Necrotic Quality:  Adherent Slough Tendon Exposed: No Muscle Exposed: No Joint Exposed: No Bone Exposed: No Periwound Skin Texture Texture Color Caesar, Shalev M. ([id number]) No Abnormalities Noted: No No Abnormalities Noted: No Callus: No Atrophie Blanche: No Crepitus: No Cyanosis: No Excoriation: No Ecchymosis: No Induration: Yes Erythema: Yes Rash: No Erythema Location: Circumferential Scarring: No Hemosiderin Staining: No Mottled: No Moisture Pallor: No No Abnormalities Noted: No Rubor: No Dry / Scaly: No Maceration: No Temperature / Pain Temperature: No Abnormality Tenderness on Palpation: Yes Wound Preparation Ulcer Cleansing: Rinsed/Irrigated with Saline, Other: soap and water, Topical Anesthetic Applied: Other: lidocaine 4%, Treatment Notes Wound #1 (Right Lower Leg) 1. Cleansed with: Clean wound with Normal Saline 2. Anesthetic Topical Lidocaine 4% cream to wound bed prior to debridement 3. Peri-wound Care: Moisturizing lotion 4. Dressing Applied: Prisma Ag 5. Secondary Dressing Applied Dry Gauze 7. Secured with 3 Layer Compression System - Right Lower Extremity Electronic Signature(s) Signed: 12/24/2017 4:22:54 PM By: Secundino Ginger Entered By: Secundino Ginger on 12/24/2017 16:21:14 Timberlake, Jaymz M. ([id number]) -------------------------------------------------------------------------------- [place] Details Patient Name: Eduardo [name]. Date of Service: 12/24/2017 3:30 PM Medical Record Number: [id number] Patient Account Number: [id number] Date of Birth/Sex: [birth year] (84 y.o. M) Treating RN: Secundino Ginger Primary Care Franceska Strahm: [name] Other Clinician: Referring Tasfia Vasseur: [name] Treating Monae Topping/Extender: Melburn Hake, HOYT Weeks in Treatment: 6 Vital Signs Time Taken: 15:30 Temperature (F): 97.9 Height (in): 62 Pulse (bpm): 81 Weight (lbs): 153.7 Respiratory Rate (breaths/min): 18 Body Mass Index (BMI): 28.1 Blood Pressure (mmHg):  141/60 Reference Range: 80 - 120 mg / dl Electronic Signature(s) Signed: 12/24/2017 4:22:54 PM By: Secundino Ginger Entered By: Secundino Ginger on 12/24/2017 15:31:31

## 2017-12-26 ENCOUNTER — Encounter: Payer: Self-pay | Admitting: Internal Medicine

## 2017-12-26 DIAGNOSIS — S32010A Wedge compression fracture of first lumbar vertebra, initial encounter for closed fracture: Secondary | ICD-10-CM

## 2017-12-26 NOTE — Progress Notes (Signed)
HAYZE, GAZDA ([id number]) Visit Report for 12/24/2017 Chief Complaint Document Details Patient Name: Eduardo [name], Eduardo [name]. Date of Service: 12/24/2017 3:30 PM Medical Record Number: [id number] Patient Account Number: [id number] Date of Birth/Sex: 1931-01-03 (84 y.o. M) Treating RN: Ahmed Prima Primary Care Provider: [name] Other Clinician: Referring Provider: [name] Treating Provider/Extender: Melburn Hake, HOYT Weeks in Treatment: 6 Information Obtained from: Patient Chief Complaint Right lower leg ulcer due to trauma Electronic Signature(s) Signed: 12/25/2017 1:39:21 PM By: Worthy Keeler PA-C Entered By: Worthy Keeler on 12/24/2017 15:33:15 Eduardo [name], [name] ([id number]) -------------------------------------------------------------------------------- HPI Details Patient Name: Eduardo [name]. Date of Service: 12/24/2017 3:30 PM Medical Record Number: [id number] Patient Account Number: [id number] Date of Birth/Sex: Jan 31, 1931 (84 y.o. M) Treating RN: Ahmed Prima Primary Care Provider: [name] Other Clinician: Referring Provider: [name] Treating Provider/Extender: Melburn Hake, HOYT Weeks in Treatment: 6 History of Present Illness HPI Description: 11/08/17 on evaluation today patient presents initially concerning a right dramatic lower extremity ulcer/skin tear which occurred three weeks ago. He states that he initially was seen in urgent care where the area was sutured closed. The sutures stayed in for two weeks he tells me. Subsequently he was seen at Dr. Alla German office where the sutures were removed and Steri-Strips were placed over the next five days. Subsequently these were also removed and unfortunately at this time it was noted that the patient had a dehisced larger wound that required further attention. The patient and his wife actually requested referral to the wound care center in [place]. [name] made the referral to [place].  Subsequently the patient does have a history of atrial fibrillation, cardiovascular disease, COPD, stroke, and is on long-term Eliquis. There's no history of diabetes that he states. Subsequently he has no other major medical problems currently. No fevers, chills, nausea, or vomiting noted at this time. 11/14/17-He is seen in follow-up evaluation for right posterior lower extremity ulcer. There is improvement with granulation tissue present; debridement. We will continue with same treatment plan he'll follow-up next week. The vascular office did call to establish an appointment, but he forgot that we had referred him and did not make an appointment. He was encouraged to contact the office to schedule an appointment for evaluation. 11/22/17 on evaluation today patient actually appears to be doing well in regard to his ulcer. This has cleaned up very nicely in my pinion I do believe the Iodoflex has been of benefit for him. He actually does have his arterial study which is scheduled for 11/27/17. In general he's having no significant discomfort mainly just with cleansing of the wound but the dressings themselves don't seem to be causing him problems. 11/29/17 on evaluation today patient appears to be doing very well in regard to his lower extremity ulcer. With that being said he unfortunately prior to coming in for evaluation today did have a trip over a world where he failed hurting his back and striking his head. With that being said he has not had any altered mental status since that time and states that he's not really any more dizzy and there does not appear to be any residual effect from this. He also does not have a significant headache. With that being said this is something I did check out for him today briefly at least since he was in our office. I do think that he needs to look out for any signs or symptoms of anything worsening such as nausea, vomiting, fever, chills, confusion, or severe  headaches. He has no visual disturbance at this point. 12/06/17 on evaluation today patient appears to be doing excellent in regard to his lower extremity ulcer. This is starting to really fill in which is good news as well. We do have the results of the arterial study unfortunately they did not perform the TBI portion of the examination and his ABI's were noncompressible. With that being said they did state that he had triphasic blood flow throughout the bilateral lower extremities and there was apparently no evidence of arterial disease on the arterial ultrasound. Overall this is good news we will be able to initiate stronger compression therapy which should help to allow this wound to heal even faster as the patient still continues to have some pitting edema. 12/10/17 on evaluation today patient appears to be doing much better in regard to his lower extremity ulcer. He has been tolerating the dressing changes without complication. Fortunately the compression wrap seems to be doing excellent at this time and overall he is showing signs of good improvement. I'm happy with the overall progress that has been made over the past several weeks but especially in the past week. The patient and his wife both are extremely pleased. 12/18/17; right posterior calf ulcer. Healthy granulation. Dimension slightly smaller. Using silver collagen under compression 12/24/17 on evaluation today patient presents for follow-up concerning his ongoing right lower extremity ulcer. He has been tolerating the dressing changes without complication. Fortunately he seems to be doing excellent at this point. There does not appear to be any evidence of infection. Electronic Signature(s) RADLE, Hazen. ([id number]) Signed: 12/25/2017 1:39:21 PM By: Worthy Keeler PA-C Entered By: Worthy Keeler on 12/25/2017 09:35:42 Mance, Argel M.  ([id number]) -------------------------------------------------------------------------------- Physical Exam Details Patient Name: Eduardo [name], Eduardo [name]. Date of Service: 12/24/2017 3:30 PM Medical Record Number: [id number] Patient Account Number: [id number] Date of Birth/Sex: 1931/01/02 (84 y.o. M) Treating RN: Ahmed Prima Primary Care Provider: [name] Other Clinician: Referring Provider: [name] Treating Provider/Extender: Melburn Hake, HOYT Weeks in Treatment: 6 Constitutional Well-nourished and well-hydrated in no acute distress. Respiratory normal breathing without difficulty. clear to auscultation bilaterally. Cardiovascular regular rate and rhythm with normal S1, S2. trace pitting edema of the bilateral lower extremities. Psychiatric this patient is able to make decisions and demonstrates good insight into disease process. Alert and Oriented x 3. pleasant and cooperative. Notes At this point the patient did not require any sharp debridement today. I was able to clean the wound with saline and gauze without complication post debridement the wound bed seems to be significantly improved. Overall feel like he is tolerating and progressing quite nicely at this time. Electronic Signature(s) Signed: 12/25/2017 1:39:21 PM By: Worthy Keeler PA-C Entered By: Worthy Keeler on 12/25/2017 09:36:30 Eduardo [name], Eduardo Kicks ([id number]) -------------------------------------------------------------------------------- Physician Orders Details Patient Name: Eduardo [name]. Date of Service: 12/24/2017 3:30 PM Medical Record Number: [id number] Patient Account Number: [id number] Date of Birth/Sex: 11/20/1930 (84 y.o. M) Treating RN: Roger Shelter Primary Care Provider: [name] Other Clinician: Referring Provider: [name] Treating Provider/Extender: Melburn Hake, HOYT Weeks in Treatment: 6 Verbal / Phone Orders: No Diagnosis Coding ICD-10 Coding Code  Description [phone] Non-pressure chronic ulcer of other part of right lower leg with fat layer exposed I48.0 Paroxysmal atrial fibrillation Z79.01 Long term (current) use of anticoagulants J44.9 Chronic obstructive pulmonary disease, unspecified I25.10 Atherosclerotic heart disease of native coronary artery without angina pectoris Wound Cleansing Wound #1 Right Lower Leg o Clean wound with Normal Saline.  Anesthetic (add to Medication List) Wound #1 Right Lower Leg o Topical Lidocaine 4% cream applied to wound bed prior to debridement (In Clinic Only). Skin Barriers/Peri-Wound Care Wound #1 Right Lower Leg o Moisturizing lotion Primary Wound Dressing Wound #1 Right Lower Leg o Silver Collagen Secondary Dressing Wound #1 Right Lower Leg o Dry Gauze Dressing Change Frequency Wound #1 Right Lower Leg o Change dressing every week Follow-up Appointments Wound #1 Right Lower Leg o Return Appointment in 1 week. Edema Control o 3 Layer Compression System - Right Lower Extremity Eduardo [name], Eduardo [name] ([id number]) Electronic Signature(s) Signed: 12/24/2017 4:58:15 PM By: Roger Shelter Signed: 12/25/2017 1:39:21 PM By: Worthy Keeler PA-C Entered By: Roger Shelter on 12/24/2017 16:28:02 Eduardo [name], Eduardo [name] Kitchen ([id number]) -------------------------------------------------------------------------------- Problem List Details Patient Name: Eduardo [name], Eduardo [name]. Date of Service: 12/24/2017 3:30 PM Medical Record Number: [id number] Patient Account Number: [id number] Date of Birth/Sex: 12-Apr-1931 (84 y.o. M) Treating RN: Ahmed Prima Primary Care Provider: [name] Other Clinician: Referring Provider: [name] Treating Provider/Extender: Melburn Hake, HOYT Weeks in Treatment: 6 Active Problems ICD-10 Evaluated Encounter Code Description Active Date Today Diagnosis [phone] Non-pressure chronic ulcer of other part of right lower leg 11/08/2017 Yes Yes with fat  layer exposed Status Complications Interventions Medical Improving wound bed is doing nicely on the posterior calf. Healthy continue silver Decision base collagen under 3 Making : layer compression I48.0 Paroxysmal atrial fibrillation 11/08/2017 No Yes Z79.01 Long term (current) use of anticoagulants 11/08/2017 No Yes J44.9 Chronic obstructive pulmonary disease, unspecified 11/08/2017 No Yes I25.10 Atherosclerotic heart disease of native coronary artery 11/08/2017 No Yes without angina pectoris Inactive Problems Resolved Problems Electronic Signature(s) Signed: 12/25/2017 1:39:21 PM By: Worthy Keeler PA-C Entered By: Worthy Keeler on 12/24/2017 15:33:04 Eduardo [name], [name] ([id number]) -------------------------------------------------------------------------------- Progress Note Details Patient Name: Eduardo [name]. Date of Service: 12/24/2017 3:30 PM Medical Record Number: [id number] Patient Account Number: [id number] Date of Birth/Sex: 06-22-1930 (84 y.o. M) Treating RN: Ahmed Prima Primary Care Provider: [name] Other Clinician: Referring Provider: [name] Treating Provider/Extender: Melburn Hake, HOYT Weeks in Treatment: 6 Subjective Chief Complaint Information obtained from Patient Right lower leg ulcer due to trauma History of Present Illness (HPI) 11/08/17 on evaluation today patient presents initially concerning a right dramatic lower extremity ulcer/skin tear which occurred three weeks ago. He states that he initially was seen in urgent care where the area was sutured closed. The sutures stayed in for two weeks he tells me. Subsequently he was seen at Dr. Alla German office where the sutures were removed and Steri-Strips were placed over the next five days. Subsequently these were also removed and unfortunately at this time it was noted that the patient had a dehisced larger wound that required further attention. The patient and his wife actually  requested referral to the wound care center in [place]. [name] made the referral to [place]. Subsequently the patient does have a history of atrial fibrillation, cardiovascular disease, COPD, stroke, and is on long-term Eliquis. There's no history of diabetes that he states. Subsequently he has no other major medical problems currently. No fevers, chills, nausea, or vomiting noted at this time. 11/14/17-He is seen in follow-up evaluation for right posterior lower extremity ulcer. There is improvement with granulation tissue present; debridement. We will continue with same treatment plan he'll follow-up next week. The vascular office did call to establish an appointment, but he forgot that we had referred him and did not make an appointment. He was encouraged to contact the office to  schedule an appointment for evaluation. 11/22/17 on evaluation today patient actually appears to be doing well in regard to his ulcer. This has cleaned up very nicely in my pinion I do believe the Iodoflex has been of benefit for him. He actually does have his arterial study which is scheduled for 11/27/17. In general he's having no significant discomfort mainly just with cleansing of the wound but the dressings themselves don't seem to be causing him problems. 11/29/17 on evaluation today patient appears to be doing very well in regard to his lower extremity ulcer. With that being said he unfortunately prior to coming in for evaluation today did have a trip over a world where he failed hurting his back and striking his head. With that being said he has not had any altered mental status since that time and states that he's not really any more dizzy and there does not appear to be any residual effect from this. He also does not have a significant headache. With that being said this is something I did check out for him today briefly at least since he was in our office. I do think that he needs to look out for any signs or symptoms of  anything worsening such as nausea, vomiting, fever, chills, confusion, or severe headaches. He has no visual disturbance at this point. 12/06/17 on evaluation today patient appears to be doing excellent in regard to his lower extremity ulcer. This is starting to really fill in which is good news as well. We do have the results of the arterial study unfortunately they did not perform the TBI portion of the examination and his ABI's were noncompressible. With that being said they did state that he had triphasic blood flow throughout the bilateral lower extremities and there was apparently no evidence of arterial disease on the arterial ultrasound. Overall this is good news we will be able to initiate stronger compression therapy which should help to allow this wound to heal even faster as the patient still continues to have some pitting edema. 12/10/17 on evaluation today patient appears to be doing much better in regard to his lower extremity ulcer. He has been tolerating the dressing changes without complication. Fortunately the compression wrap seems to be doing excellent at this time and overall he is showing signs of good improvement. I'm happy with the overall progress that has been made over the past several weeks but especially in the past week. The patient and his wife both are extremely pleased. 12/18/17; right posterior calf ulcer. Healthy granulation. Dimension slightly smaller. Using silver collagen under compression 12/24/17 on evaluation today patient presents for follow-up concerning his ongoing right lower extremity ulcer. He has been Eduardo [name], [name] ([id number]) tolerating the dressing changes without complication. Fortunately he seems to be doing excellent at this point. There does not appear to be any evidence of infection. Patient History Information obtained from Patient. Family History Cancer - Mother,Child, Heart Disease - Father, Lung Disease - Child, Stroke - Maternal  Grandparents, No family history of Diabetes, Hereditary Spherocytosis, Hypertension, Kidney Disease, Seizures, Thyroid Problems, Tuberculosis. Social History Former smoker - 66 years, Marital Status - Married, Alcohol Use - Never, Caffeine Use - Daily. Review of Systems (ROS) Constitutional Symptoms (General Health) Denies complaints or symptoms of Fever, Chills. Respiratory The patient has no complaints or symptoms. Cardiovascular The patient has no complaints or symptoms. Psychiatric The patient has no complaints or symptoms. Objective Constitutional Well-nourished and well-hydrated in no acute distress. Vitals Time Taken: 3:30  PM, Height: 62 in, Weight: 153.7 lbs, BMI: 28.1, Temperature: 97.9 F, Pulse: 81 bpm, Respiratory Rate: 18 breaths/min, Blood Pressure: 141/60 mmHg. Respiratory normal breathing without difficulty. clear to auscultation bilaterally. Cardiovascular regular rate and rhythm with normal S1, S2. trace pitting edema of the bilateral lower extremities. Psychiatric this patient is able to make decisions and demonstrates good insight into disease process. Alert and Oriented x 3. pleasant and cooperative. General Notes: At this point the patient did not require any sharp debridement today. I was able to clean the wound with saline and gauze without complication post debridement the wound bed seems to be significantly improved. Overall feel like he is tolerating and progressing quite nicely at this time. Integumentary (Hair, Skin) Wound #1 status is Open. Original cause of wound was Trauma. The wound is located on the Right Lower Leg. The wound Eduardo [name], [name] ([id number]) measures 1.5cm length x 0.5cm width x 0.2cm depth; 0.589cm^2 area and 0.118cm^3 volume. There is Fat Layer (Subcutaneous Tissue) Exposed exposed. There is no tunneling or undermining noted. There is a large amount of serosanguineous drainage noted. The wound margin is thickened. There is small  (1-33%) pink granulation within the wound bed. There is a small (1-33%) amount of necrotic tissue within the wound bed including Adherent Slough. The periwound skin appearance exhibited: Induration, Erythema. The periwound skin appearance did not exhibit: Callus, Crepitus, Excoriation, Rash, Scarring, Dry/Scaly, Maceration, Atrophie Blanche, Cyanosis, Ecchymosis, Hemosiderin Staining, Mottled, Pallor, Rubor. The surrounding wound skin color is noted with erythema which is circumferential. Periwound temperature was noted as No Abnormality. The periwound has tenderness on palpation. Assessment Active Problems ICD-10 Non-pressure chronic ulcer of other part of right lower leg with fat layer exposed Paroxysmal atrial fibrillation Long term (current) use of anticoagulants Chronic obstructive pulmonary disease, unspecified Atherosclerotic heart disease of native coronary artery without angina pectoris Plan Wound Cleansing: Wound #1 Right Lower Leg: Clean wound with Normal Saline. Anesthetic (add to Medication List): Wound #1 Right Lower Leg: Topical Lidocaine 4% cream applied to wound bed prior to debridement (In Clinic Only). Skin Barriers/Peri-Wound Care: Wound #1 Right Lower Leg: Moisturizing lotion Primary Wound Dressing: Wound #1 Right Lower Leg: Silver Collagen Secondary Dressing: Wound #1 Right Lower Leg: Dry Gauze Dressing Change Frequency: Wound #1 Right Lower Leg: Change dressing every week Follow-up Appointments: Wound #1 Right Lower Leg: Return Appointment in 1 week. Edema Control: 3 Layer Compression System - Right Lower Extremity Medical Decision Making Non-pressure chronic ulcer of other part of right lower leg with fat layer exposed 11/08/2017 Status: Improving Complications: wound bed is doing nicely on the posterior calf. Healthy base Interventions: continue silver collagen under 3 layer compression Eduardo [name], [name] ([id number]) I'm going to recommend at this  point that we go ahead and continue with the Current wound care measures for the next week. Patient is in agreement with plan. If anything changes or worsens in the interim he will contact the office for additional recommendations. Please see above for specific wound care orders. We will see patient for re-evaluation in 1 week(s) here in the clinic. If anything worsens or changes patient will contact our office for additional recommendations. Electronic Signature(s) Signed: 12/25/2017 1:39:21 PM By: Worthy Keeler PA-C Entered By: Worthy Keeler on 12/25/2017 09:37:06 Eduardo [name], Eduardo [name] ([id number]) -------------------------------------------------------------------------------- ROS/PFSH Details Patient Name: Eduardo [name]. Date of Service: 12/24/2017 3:30 PM Medical Record Number: [id number] Patient Account Number: [id number] Date of Birth/Sex: 1930-09-02 (84 y.o. M) Treating RN: Ahmed Prima Primary Care  Provider: [name] Other Clinician: Referring Provider: [name] Treating Provider/Extender: Melburn Hake, HOYT Weeks in Treatment: 6 Information Obtained From Patient Wound History Do you currently have one or more open woundso Yes How many open wounds do you currently haveo 1 Approximately how long have you had your woundso 3.5 weeks How have you been treating your wound(s) until nowo wet to dry Has your wound(s) ever healed and then re-openedo No Have you had any lab work done in the past montho Yes Have you tested positive for an antibiotic resistant organism (MRSA, VRE)o No Have you tested positive for osteomyelitis (bone infection)o No Have you had any tests for circulation on your legso No Constitutional Symptoms (General Health) Complaints and Symptoms: Negative for: Fever; Chills Eyes Medical History: Positive for: Glaucoma - replaced Negative for: Cataracts; Optic Neuritis Ear/Nose/Mouth/Throat Medical History: Negative for: Chronic sinus  problems/congestion; Middle ear problems Hematologic/Lymphatic Medical History: Negative for: Anemia; Hemophilia; Human Immunodeficiency Virus; Lymphedema; Sickle Cell Disease Respiratory Complaints and Symptoms: No Complaints or Symptoms Medical History: Positive for: Chronic Obstructive Pulmonary Disease (COPD) Negative for: Aspiration; Asthma; Pneumothorax; Sleep Apnea; Tuberculosis Cardiovascular Complaints and Symptoms: No Complaints or Symptoms Medical History: Positive for: Arrhythmia - A-fib; Congestive Heart Failure; Coronary Artery Disease; Myocardial Infarction - [address] Maicol M. ([id number]) Negative for: Angina; Deep Vein Thrombosis; Hypertension; Hypotension; Peripheral Arterial Disease; Peripheral Venous Disease; Phlebitis; Vasculitis Gastrointestinal Medical History: Negative for: Cirrhosis ; Colitis; Crohnos; Hepatitis A; Hepatitis B; Hepatitis C Endocrine Medical History: Negative for: Type I Diabetes; Type II Diabetes Genitourinary Medical History: Negative for: End Stage Renal Disease Immunological Medical History: Negative for: Lupus Erythematosus; Raynaudos; Scleroderma Integumentary (Skin) Medical History: Negative for: History of Burn; History of pressure wounds Musculoskeletal Medical History: Negative for: Gout; Rheumatoid Arthritis; Osteoarthritis; Osteomyelitis Neurologic Medical History: Positive for: Neuropathy - Feet and legs Negative for: Dementia; Quadriplegia; Paraplegia; Seizure Disorder Oncologic Medical History: Negative for: Received Chemotherapy; Received Radiation Psychiatric Complaints and Symptoms: No Complaints or Symptoms Medical History: Negative for: Anorexia/bulimia; Confinement Anxiety HBO Extended History Items Eyes: Glaucoma Immunizations Pneumococcal Vaccine: Received Pneumococcal Vaccination: Yes KHAMRON, GELLERT ([id number]) Implantable Devices Family and Social History Cancer: Yes - Mother,Child;  Diabetes: No; Heart Disease: Yes - Father; Hereditary Spherocytosis: No; Hypertension: No; Kidney Disease: No; Lung Disease: Yes - Child; Seizures: No; Stroke: Yes - Maternal Grandparents; Thyroid Problems: No; Tuberculosis: No; Former smoker - 56 years; Marital Status - Married; Alcohol Use: Never; Caffeine Use: Daily; Advanced Directives: Yes (Not Provided); Patient does not want information on Advanced Directives; Living Will: Yes (Copy provided); Medical Power of Attorney: Yes (Not Provided) Physician Affirmation I have reviewed and agree with the above information. Electronic Signature(s) Signed: 12/25/2017 1:39:21 PM By: Worthy Keeler PA-C Signed: 12/25/2017 5:07:56 PM By: Alric Quan Entered By: Worthy Keeler on 12/25/2017 09:36:09 Holzworth, Kylil M. ([id number]) -------------------------------------------------------------------------------- SuperBill Details Patient Name: Eduardo [name]. Date of Service: 12/24/2017 Medical Record Number: [id number] Patient Account Number: [id number] Date of Birth/Sex: 04/27/1931 (84 y.o. M) Treating RN: Roger Shelter Primary Care Provider: [name] Other Clinician: Referring Provider: [name] Treating Provider/Extender: Melburn Hake, HOYT Weeks in Treatment: 6 Diagnosis Coding ICD-10 Codes Code Description [phone] Non-pressure chronic ulcer of other part of right lower leg with fat layer exposed I48.0 Paroxysmal atrial fibrillation Z79.01 Long term (current) use of anticoagulants J44.9 Chronic obstructive pulmonary disease, unspecified I25.10 Atherosclerotic heart disease of native coronary artery without angina pectoris Facility Procedures CPT4 Code: [id number] Description: 02774 - WOUND CARE VISIT-LEV 2 EST  PT Modifier: Quantity: 1 Physician Procedures CPT4 Code Description: [id number] - WC PHYS LEVEL 3 - EST PT ICD-10 Diagnosis Description J00.938 Non-pressure chronic ulcer of other part of right lower leg w  I48.0 Paroxysmal atrial fibrillation Z79.01 Long term (current) use of anticoagulants J44.9  Chronic obstructive pulmonary disease, unspecified Modifier: ith fat layer expo Quantity: 1 sed Electronic Signature(s) Signed: 12/25/2017 1:39:21 PM By: Worthy Keeler PA-C Previous Signature: 12/24/2017 4:58:15 PM Version By: Roger Shelter Entered By: Worthy Keeler on 12/25/2017 09:37:24

## 2017-12-27 NOTE — Telephone Encounter (Signed)
Okay to set up referral to Dr Ninfa Linden Let them know he is my favorite

## 2017-12-28 DIAGNOSIS — I2510 Atherosclerotic heart disease of native coronary artery without angina pectoris: Secondary | ICD-10-CM | POA: Insufficient documentation

## 2017-12-28 NOTE — Progress Notes (Signed)
Cardiology Office Note  Date:  12/30/2017   ID:  Eduardo [name], DOB 16-May-1931, MRN [id number]  PCP:  Venia Carbon, MD   Chief Complaint  Patient presents with  . OTHER    Former Dr. Tamala Julian pt est. care. Meds reviewed verbally  with pt.    HPI:  Eduardo [name] is a 84 y.o. male  Stopped smoking 84 yo  cardiac catheterization in 2012 with moderate LAD and RCA disease STEMI with repeat catheterization documenting RCA dissection, managed medically chronic atrial fibrillation,  embolic stroke,  coronary artery disease,  hypertension Last seen in 2016 Right Carotid stenosis moderate 11/27/2017 LE arterial doppler ok 11/27/2017 COPD exacerbation 07/2016  Recent seen by pulmonary, had shortness of breath and pulmonary edema on chest x-ray Doubled lasix for 5 days Dropped 10 pounds, shortness of breath improved Weight at home 134.5  Is recovering from a wound right lower extremity tore skin,  Has unna wrap in place  Has a trainer Trying to stay active but limited by severe back pain Difficulty getting in a comfortable position Fractured L1 Having evaluation for possible surgery  Recent carotid and lower extremity arterial Dopplers discussed with him  EKG personally reviewed by myself on todays visit Shows atrial fibrillation with ventricular rate 83 bpm no significant ST or T-wave changes  Other past medical history reviewed Echocardiogram showing severe biatrial enlargement, calcification of the mitral valve, normal ejection fraction Carotid ultrasound with less than 39% bilateral carotid disease  GI bleed/profound anemia in July 2013 with a fall at the time Hemoglobin at that time was around 6. 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, periodically seen by the pain clinic  Suffered a stroke July 5188 felt to be embolic, anticoagulation  restarted So far he has been tolerating eliquis 5 mill grams twice a day with no symptoms, denies any bleeding  Relatively sedentary, limited by his back pain, does exercise 2 days per week No symptoms of chest pain on exertion breath   Verapamil dose recently decreased from 240 mg daily down to 180 mg daily for bradycardia On a lower dose he has noticed heart rate has improved Denies any lightheadedness or dizziness on standing Overall feels well with no complaints   lab work reviewed with him showing chronically elevated triglycerides for which she is on fenofibrate. Total cholesterol at goal, less than 150 hemoglobin A1c less than 6 Nonsmoker   PMH:   has a past medical history of Allergy, Anemia (2012, 08/2015), Anxiety, Atrial fibrillation ([place]) (2012), CAD (coronary artery disease), COPD (chronic obstructive pulmonary disease) ([place]) (2013), Gastric angiodysplasia with hemorrhage (08/2015), colonic polyps (2001, 2011), Hyperlipidemia, Myocardial infarction [facility]) (06/2010.  12/2011.), Neuropathy (2014), Osteoarthritis (2002), Stroke ([place]) (2016), and Upper GI bleed (08/2015).  PSH:    Past Surgical History:  Procedure Laterality Date  . APPENDECTOMY    . CARDIAC CATHETERIZATION  2012   50% LAD and luminal irreg in RCA, 1/12  Post cath MI from damage  . CARPAL TUNNEL RELEASE     left  . CARPAL TUNNEL RELEASE  10/11/11   Dr Rush Barer  . CATARACT EXTRACTION W/ INTRAOCULAR LENS IMPLANT  2/13   Dr [name]  . COLONOSCOPY  2001, 02/2010   Dr Deatra Ina.   . ESOPHAGOGASTRODUODENOSCOPY N/A 08/15/2015   [name] MD; 2 small gastric AVMs, 1 actively bleeding.  Both treated with  APC ablation, clipping of the bleeder  . FOOT FRACTURE SURGERY     right heel repair  . FRACTURE SURGERY    . KNEE ARTHROSCOPY     left, right knee 10/2009  . MASTOIDECTOMY     x3 on right  . NASAL SEPTUM SURGERY     nasal septal repair  . PENILE PROSTHESIS  REMOVAL    . PENILE PROSTHESIS IMPLANT     x  2--got infection after 2nd and had to remove  . PILONIDAL CYST / SINUS EXCISION    . SHOULDER ARTHROSCOPY     right  . TONSILLECTOMY AND ADENOIDECTOMY      Current Outpatient Medications  Medication Sig Dispense Refill  . acetaminophen (TYLENOL) 650 MG CR tablet Take 1,300 mg by mouth 2 (two) times daily.    Marland Kitchen albuterol (PROAIR HFA) 108 (90 Base) MCG/ACT inhaler Inhale 2 puffs into the lungs every 6 (six) hours as needed for wheezing or shortness of breath. 18 g 0  . albuterol (PROVENTIL) (2.5 MG/3ML) 0.083% nebulizer solution use 1 vial in nebulizer every 4 hours as needed for wheezing or shortness of breath 270 mL 2  . ALPRAZolam (XANAX) 0.25 MG tablet Take 1 tablet (0.25 mg total) by mouth 2 (two) times daily as needed for anxiety. 20 tablet 0  . atorvastatin (LIPITOR) 80 MG tablet TAKE ONE TABLET BY MOUTH ONE TIME DAILY  90 tablet 3  . azelastine (ASTELIN) 0.1 % nasal spray Place 2 sprays into both nostrils at bedtime. Use in each nostril as directed    . B Complex-C (SUPER B COMPLEX PO) Take 1 tablet by mouth at bedtime.     . cetirizine (ZYRTEC) 10 MG tablet Take 10 mg by mouth at bedtime.     . cholecalciferol (VITAMIN D) 1000 units tablet Take 1,000 Units by mouth daily.    Marland Kitchen ELIQUIS 5 MG TABS tablet TAKE 1 TABLET BY MOUTH 2 TIMES DAILY 180 tablet 1  . escitalopram (LEXAPRO) 20 MG tablet Take 20 mg by mouth at bedtime.     . fenofibrate 160 MG tablet TAKE 1 TABLET BY MOUTH AT BEDTIME. 90 tablet 3  . furosemide (LASIX) 20 MG tablet TAKE 1 TABLET BY MOUTH DAILY 90 tablet 3  . guaiFENesin (ROBITUSSIN) 100 MG/5ML SOLN Take 5 mLs by mouth every 4 (four) hours as needed for cough or to loosen phlegm.    . hydrALAZINE (APRESOLINE) 10 MG tablet Take 1 tablet (10 mg total) by mouth 3 (three) times daily. 270 tablet 3  . HYDROcodone-acetaminophen (NORCO/VICODIN) 5-325 MG tablet Take 1 tablet by mouth every 4 (four) hours as needed for moderate pain. 30 tablet 0  . ipratropium (ATROVENT) 0.03 %  nasal spray PLACE 2 SPRAYS INTO BOTH NOSTRILS AT BEDTIME. 90 mL 3  . IRON PO Take by mouth.    . lamoTRIgine (LAMICTAL) 150 MG tablet Take 150 mg by mouth at bedtime. Reported on 08/14/2015    . montelukast (SINGULAIR) 10 MG tablet Take 1 tablet (10 mg total) by mouth at bedtime. 90 tablet 3  . Multiple Vitamin (MULTIVITAMIN) tablet Take 1 tablet by mouth at bedtime.     . pantoprazole (PROTONIX) 40 MG tablet TAKE 1 TABLET BY MOUTH DAILY BEFORE BREAKFAST. 90 tablet 3  . Tiotropium Bromide-Olodaterol (STIOLTO RESPIMAT) 2.5-2.5 MCG/ACT AERS Inhale 2 puffs into the lungs daily. 12 Inhaler 3  . triamcinolone cream (KENALOG) 0.1 % Apply 1 application topically 2 (two) times daily as needed (for skin).     Marland Kitchen  verapamil (CALAN-SR) 240 MG CR tablet TAKE 1 TABLET BY MOUTH AT BEDTIME 90 tablet 1   No current facility-administered medications for this visit.      Allergies:   Penicillins; Sulfonamide derivatives; and Tape   Social History:  The patient  reports that he quit smoking about 39 years ago. His smoking use included cigarettes. He has a 75.00 pack-year smoking history. He has never used smokeless tobacco. He reports that he does not drink alcohol or use drugs.   Family History:   family history includes Cancer in his mother; Heart attack in his father; Stroke in his maternal grandmother.    Review of Systems: Review of Systems  Constitutional: Negative.   Respiratory: Positive for shortness of breath.   Cardiovascular: Negative.   Gastrointestinal: Negative.   Musculoskeletal: Positive for back pain.  Neurological: Negative.   Psychiatric/Behavioral: Negative.   All other systems reviewed and are negative.    PHYSICAL EXAM: VS:  BP 130/64 (BP Location: Left Arm, Patient Position: Sitting, Cuff Size: Normal)   Pulse 83   Ht 5\' 2"  (1.575 m)   Wt 140 lb 12 oz (63.8 kg)   BMI 25.74 kg/m  , BMI Body mass index is 25.74 kg/m. Constitutional:  oriented to person, place, and time. No  distress.  HENT:  Head: Normocephalic and atraumatic.  Eyes:  no discharge. No scleral icterus.  Neck: Normal range of motion. Neck supple. No JVD present.  Cardiovascular: irregularly irregular, normal heart sounds and intact distal pulses. Exam reveals no gallop and no friction rub. No edema No murmur heard. Pulmonary/Chest: moderately decreased breath sounds throughout,  No stridor. No respiratory distress.  no wheezes.  no rales.  no tenderness.  Abdominal: Soft.  no distension.  no tenderness.  Musculoskeletal: Normal range of motion.  no  tenderness or deformity.  Neurological:  normal muscle tone. Coordination normal. No atrophy Skin: Skin is warm and dry. No rash noted. not diaphoretic.  Psychiatric:  normal mood and affect. behavior is normal. Thought content normal.     Recent Labs: 06/07/2017: TSH 2.07 07/01/2017: ALT 23; Hemoglobin 10.2; Platelet Count 175 12/16/2017: Pro B Natriuretic peptide (BNP) 428.0 12/30/2017: BUN 28; Creatinine, Ser 1.08; Potassium 4.1; Sodium 136    Lipid Panel Lab Results  Component Value Date   CHOL 132 06/24/2017   HDL 44.80 06/24/2017   LDLCALC UNABLE TO CALCULATE IF TRIGLYCERIDE OVER 400 mg/dL 12/31/2014   TRIG (H) 06/24/2017    1031.0 Triglyceride is over 400; calculations on Lipids are invalid.      Wt Readings from Last 3 Encounters:  12/30/17 140 lb 12 oz (63.8 kg)  12/30/17 140 lb (63.5 kg)  12/16/17 151 lb 3.2 oz (68.6 kg)      ASSESSMENT AND PLAN:  Chronic atrial fibrillation ([place]) - Plan: EKG 12-Lead rate relatively well-controlled Tolerating anticoagulation Tolerating verapamil,  for rate control  Coronary artery disease of native artery of native heart with stable angina pectoris ([place]) - Plan: EKG 12-Lead Currently with no symptoms of angina. No further workup at this time. 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 No changes made to the medications.  Bilateral carotid artery stenosis Recent ultrasound results reviewed  with him Moderate disease on the right Cholesterol at goal  Pulmonary emphysema, unspecified emphysema type ([place]) Followed by pulmonary  Disposition:   F/U  12 months  Long discussion of issues detailed above He is transferring care from [place] office to the [place] office today  Total encounter time more than 45 minutes  Greater than 50% was spent in counseling and coordination of care with the patient    Orders Placed This Encounter  Procedures  . EKG 12-Lead     Signed, Esmond Plants, M.D., Ph.D. 12/30/2017  [place], [place]

## 2017-12-29 NOTE — Progress Notes (Signed)
@Patient  ID: Eduardo [name], male    DOB: 1930-07-02, 84 y.o.   MRN: [id number]  Chief Complaint  Patient presents with  . Follow-up    COPD fu with cxray    Referring provider: Venia Carbon, MD  HPI: 84 year old patient of Dr. Lamonte Sakai.  With chronic cough.  Patient with COPD as well. Former smoker (quit 1980).  75 pack years.  Recent [facility] Encounters:   ROV 08/05/17 --84 year old former smoker with a history of coronary artery disease, atrial fibrillation, allergic rhinitis, chronic cough with upper airway instability.  He also has COPD with an FEV1 of 1.03 L.  He is currently managed on Stiolto.  Also uses Zyrtec, Atrovent nasal spray for his chronic rhinitis. He still goes to the gym 3x a week. He began to get some increased congestion beginning of the year. Has been using mucinex, robutussin. Reliable with Stiolto. He has albuterol nebs, uses about 1x a day.   12/09/17 Acute Patient presenting today for office visit with continued symptoms of shortness of breath, wheezing, fatigue.  Patient reporting that he is using his Stiolto inhaler, daily antihistamine, rescue inhaler, nebulizers.  Patient also reporting that he fell at home and has seen primary care for this.  Patient reporting that he needs a refill of his hydrocodone for his back pain. Plan: Doxycycline, prednisone taper, resume Astelin nasal spray, follow-up with primary care regarding pain management and refill of hydrocodone  12/16/17 OV Pleasant 84 year old patient reporting for 1 week follow-up from being seen for COPD exacerbation.  Patient is almost done with his doxycycline prescription, and is finishing his prednisone taper.  Patient still having significant amounts of congestion with green mucus. Patient continuing to report that he needs better management of his pain.  Status post fall.  Patient is currently being managed by primary care.  They report the primary care provider muscle relaxer  prescription but he feels that this was not strong enough and barely did anything.  He has not followed up with primary care since then. Plan: labwork today, cxry    12/30/2017 Follow-up 84 year old patient of Dr. Lamonte Sakai seen in office visit today.  Patient with improved respiratory symptoms.  Weight is decreased since last office visit.  Lower extremity swelling has improved since last visit.   Patient also reports that he had an increase allergy symptoms 2 weeks ago and then realized he was not taking the Singulair.  Since restarting Singulair symptoms have improved.  Patient denies nasal congestion, admits to postnasal drip.  Patient is reporting some left flank pain as well as urgency and frequency with urination.  Patient denies hematuria in urine.  Patient has called primary care office and is wondering if he should follow-up with them or if we can manage here.   Allergies  Allergen Reactions  . Penicillins Itching    Has patient had a PCN reaction causing immediate rash, facial/tongue/throat swelling, SOB or lightheadedness with hypotension: Yes Has patient had a PCN reaction causing severe rash involving mucus membranes or skin necrosis: No Has patient had a PCN reaction that required hospitalization No Has patient had a PCN reaction occurring within the last 10 years: No If all of the above answers are "NO", then may proceed with Cephalosporin use.   . Sulfonamide Derivatives Other (See Comments)    "it affected my kidneys"  . Tape Other (See Comments)    Arms black and blue, tears skin.  Please use "paper" tape    Immunization History  Administered Date(s) Administered  . Influenza Split 03/12/2011, 05/12/2012  . Influenza Whole 05/31/2009  . Influenza,inj,Quad PF,6+ Mos 03/05/2013, 02/17/2014, 03/11/2015, 04/03/2016, 02/05/2017  . Pneumococcal Conjugate-13 04/19/2014  . Pneumococcal Polysaccharide-23 06/11/2005, 06/24/2017  . Td 12/27/2010  . Zoster 09/09/2006    Past  Medical History:  Diagnosis Date  . Allergy   . Anemia 2012, 08/2015   chronic. Acute due to large hematoma 2013. 2017: due to gastric AVM bleeding.   . Anxiety   . Atrial fibrillation ([place]) 2012   a.  CHA2DS2VASc = 6-->eliquis;  b. 12/2015 Echo: EF 60-65%, no rwma, LVH, mild AS/MS/MR, sev dil LA, mild TR, PASP 78mmHg.  Marland Kitchen CAD (coronary artery disease)    a. 06/2010 Cath: LM nl, LAD 50p/m, LCX 105m, RCA 50p/m -->catheter dissection but good flow, 70d, RPDA 50-70;  b. 06/2010 Relook cath due to bradycardia and inf ST elev: RCA dissection @ ostium extending into coronary cusp and prox RCA, Ao dissection-->less staining than previously-->Med Rx.  Marland Kitchen COPD (chronic obstructive pulmonary disease) ([place]) 2013   exertional dyspnea  . Gastric angiodysplasia with hemorrhage 08/2015  . Hx of colonic polyps 2001, 2011   adenomatous 2001. HP 2001, 2011.  Marland Kitchen Hyperlipidemia   . Myocardial infarction ([place]) 06/2010.  12/2011.   "twice; 1 day apart; during/after cath". NQMI in setting severe anemia 2013.   Marland Kitchen Neuropathy 2014   in feet, likely due to spinal stenosis, DDD, HNP  . Osteoarthritis 2002   spinal stenosis, spondylolisthesis, disc protrusion multilevel in lumbar spine  . Stroke ([place]) 2016  . Upper GI bleed 08/2015   EGD: 2 small gastric AVMs, 1 actively bleeding.  Both treated with APC ablation, clipping of the bleeder    Tobacco History: Social History   Tobacco Use  Smoking Status Former Smoker  . Packs/day: 2.50  . Years: 30.00  . Pack years: 75.00  . Types: Cigarettes  . Last attempt to quit: 06/11/1978  . Years since quitting: 39.5  Smokeless Tobacco Never Used   Counseling given: Yes Continue not smoking  Outpatient Encounter Medications as of 12/30/2017  Medication Sig  . acetaminophen (TYLENOL) 650 MG CR tablet Take 1,300 mg by mouth 2 (two) times daily.  Marland Kitchen albuterol (PROAIR HFA) 108 (90 Base) MCG/ACT inhaler Inhale 2 puffs into the lungs every 6 (six) hours as needed for wheezing or  shortness of breath.  Marland Kitchen albuterol (PROVENTIL) (2.5 MG/3ML) 0.083% nebulizer solution use 1 vial in nebulizer every 4 hours as needed for wheezing or shortness of breath  . ALPRAZolam (XANAX) 0.25 MG tablet Take 1 tablet (0.25 mg total) by mouth 2 (two) times daily as needed for anxiety.  Marland Kitchen atorvastatin (LIPITOR) 80 MG tablet TAKE ONE TABLET BY MOUTH ONE TIME DAILY   . azelastine (ASTELIN) 0.1 % nasal spray Place 2 sprays into both nostrils at bedtime. Use in each nostril as directed  . B Complex-C (SUPER B COMPLEX PO) Take 1 tablet by mouth at bedtime.   . cetirizine (ZYRTEC) 10 MG tablet Take 10 mg by mouth at bedtime.   . cholecalciferol (VITAMIN D) 1000 units tablet Take 1,000 Units by mouth daily.  Marland Kitchen ELIQUIS 5 MG TABS tablet TAKE 1 TABLET BY MOUTH 2 TIMES DAILY  . escitalopram (LEXAPRO) 20 MG tablet Take 20 mg by mouth at bedtime.   . fenofibrate 160 MG tablet TAKE 1 TABLET BY MOUTH AT BEDTIME.  . furosemide (LASIX) 20 MG tablet TAKE 1 TABLET BY MOUTH DAILY  . hydrALAZINE (APRESOLINE) 10  MG tablet Take 1 tablet (10 mg total) by mouth 3 (three) times daily.  Marland Kitchen HYDROcodone-acetaminophen (NORCO/VICODIN) 5-325 MG tablet Take 1 tablet by mouth every 4 (four) hours as needed for moderate pain.  Marland Kitchen ipratropium (ATROVENT) 0.03 % nasal spray PLACE 2 SPRAYS INTO BOTH NOSTRILS AT BEDTIME.  . IRON PO Take by mouth.  . lamoTRIgine (LAMICTAL) 150 MG tablet Take 150 mg by mouth at bedtime. Reported on 08/14/2015  . montelukast (SINGULAIR) 10 MG tablet Take 1 tablet (10 mg total) by mouth at bedtime.  . Multiple Vitamin (MULTIVITAMIN) tablet Take 1 tablet by mouth at bedtime.   . pantoprazole (PROTONIX) 40 MG tablet TAKE 1 TABLET BY MOUTH DAILY BEFORE BREAKFAST.  Marland Kitchen Tiotropium Bromide-Olodaterol (STIOLTO RESPIMAT) 2.5-2.5 MCG/ACT AERS Inhale 2 puffs into the lungs daily.  . tizanidine (ZANAFLEX) 2 MG capsule Take 1 capsule (2 mg total) by mouth 3 (three) times daily as needed for muscle spasms.  Marland Kitchen  triamcinolone cream (KENALOG) 0.1 % Apply 1 application topically 2 (two) times daily as needed (for skin).   . Turmeric 500 MG TABS Take 2 tablets by mouth.  . verapamil (CALAN-SR) 240 MG CR tablet TAKE 1 TABLET BY MOUTH AT BEDTIME  . [DISCONTINUED] doxycycline (VIBRA-TABS) 100 MG tablet Take 1 tablet (100 mg total) by mouth 2 (two) times daily. (Patient not taking: Reported on 12/30/2017)  . [DISCONTINUED] levofloxacin (LEVAQUIN) 500 MG tablet Take 1 tablet (500 mg total) by mouth daily. (Patient not taking: Reported on 12/30/2017)  . [DISCONTINUED] predniSONE (DELTASONE) 10 MG tablet Take 1 tablet (10 mg total) by mouth daily with breakfast. (Patient not taking: Reported on 12/30/2017)   No facility-administered encounter medications on file as of 12/30/2017.      Review of Systems  Review of Systems  Constitutional: Negative for activity change, chills, fatigue, fever and unexpected weight change (Weight decreasing over the past 2 months.).  HENT: Positive for postnasal drip. Negative for congestion, rhinorrhea, sinus pressure, sinus pain, sneezing and sore throat.   Respiratory: Negative for cough, shortness of breath and wheezing.   Cardiovascular: Negative for chest pain and palpitations.  Gastrointestinal: Negative for constipation, diarrhea, nausea and vomiting.  Genitourinary: Positive for flank pain, frequency and urgency. Negative for discharge, hematuria and penile pain.  Musculoskeletal: Negative for arthralgias.  Skin: Negative for color change.  Neurological: Negative for dizziness and headaches.  Psychiatric/Behavioral: Negative for dysphoric mood. The patient is not nervous/anxious.   All other systems reviewed and are negative.   MMRC - Breathlessness Score 2 - on level ground, I walk slower than people of the same age because of breathlessness, or have to stop for breathe when walking to my own pace    Physical Exam  BP 126/72   Pulse 85   Ht 5\' 2"  (1.575 m)   Wt  140 lb (63.5 kg)   SpO2 99%   BMI 25.61 kg/m   Wt Readings from Last 5 Encounters:  12/30/17 140 lb (63.5 kg)  12/16/17 151 lb 3.2 oz (68.6 kg)  12/11/17 155 lb (70.3 kg)  12/09/17 150 lb 12.8 oz (68.4 kg)  12/04/17 150 lb 12 oz (68.4 kg)     Physical Exam  Constitutional: He is oriented to person, place, and time and well-developed, well-nourished, and in no distress. No distress.  HENT:  Head: Normocephalic and atraumatic.  Right Ear: Hearing, tympanic membrane, external ear and ear canal normal.  Left Ear: Hearing, tympanic membrane, external ear and ear canal normal.  Nose: Nose  normal. Right sinus exhibits no maxillary sinus tenderness and no frontal sinus tenderness. Left sinus exhibits no maxillary sinus tenderness and no frontal sinus tenderness.  Mouth/Throat: Uvula is midline and oropharynx is clear and moist. No oropharyngeal exudate.  Eyes: Pupils are equal, round, and reactive to light.  Neck: Normal range of motion. Neck supple. No JVD present.  Cardiovascular: Normal rate, regular rhythm and normal heart sounds.  Pulmonary/Chest: Effort normal and breath sounds normal. No accessory muscle usage. No respiratory distress. He has no decreased breath sounds. He has no wheezes. He has no rhonchi.  Abdominal: Soft. Bowel sounds are normal. There is no tenderness. There is CVA tenderness (minor on left flank ).  Genitourinary:  Genitourinary Comments: + Right CVA tenderness minor, no left CVA tenderness on exam  Musculoskeletal: Normal range of motion. He exhibits edema (trace LE bilateral edema, improved since last exam ).  Lymphadenopathy:    He has no cervical adenopathy.  Neurological: He is alert and oriented to person, place, and time. Gait normal.  Skin: Skin is warm, dry and intact. He is not diaphoretic. No erythema.  Psychiatric: Mood, memory, affect and judgment normal.  Nursing note and vitals reviewed.     Lab Results:  CBC    Component Value Date/Time     WBC 7.0 07/01/2017 1218   WBC 6.8 06/24/2017 1709   RBC 3.56 (L) 07/01/2017 1218   HGB 10.2 (L) 07/01/2017 1218   HGB 11.4 (L) 09/08/2011 0956   HCT 32.5 (L) 07/01/2017 1218   HCT 35.4 (L) 09/08/2011 0956   PLT 175 07/01/2017 1218   PLT 203 09/08/2011 0956   MCV 91.3 07/01/2017 1218   MCV 85 09/08/2011 0956   MCH 28.7 07/01/2017 1218   MCHC 31.4 (L) 07/01/2017 1218   RDW 15.7 (H) 07/01/2017 1218   RDW 14.7 (H) 09/08/2011 0956   LYMPHSABS 1.3 07/01/2017 1218   MONOABS 0.5 07/01/2017 1218   EOSABS 0.3 07/01/2017 1218   BASOSABS 0.0 07/01/2017 1218    BMET    Component Value Date/Time   NA 142 12/16/2017 1542   NA 143 08/08/2016 0847   NA 142 09/08/2011 0956   K 4.4 12/16/2017 1542   K 3.9 09/08/2011 0956   CL 104 12/16/2017 1542   CL 105 09/08/2011 0956   CO2 30 12/16/2017 1542   CO2 26 09/08/2011 0956   GLUCOSE 91 12/16/2017 1542   GLUCOSE 97 09/08/2011 0956   BUN 29 (H) 12/16/2017 1542   BUN 25 08/08/2016 0847   BUN 20 (H) 09/08/2011 0956   CREATININE 0.99 12/16/2017 1542   CREATININE 1.21 07/01/2017 1218   CREATININE 1.01 08/22/2015 0957   CALCIUM 10.1 12/16/2017 1542   CALCIUM 9.0 09/08/2011 0956   GFRNONAA 53 (L) 07/01/2017 1218   GFRNONAA >60 09/08/2011 0956   GFRAA >60 07/01/2017 1218   GFRAA >60 09/08/2011 0956    BNP    Component Value Date/Time   BNP 261.9 (H) 07/13/2016 2303    ProBNP    Component Value Date/Time   PROBNP 428.0 (H) 12/16/2017 1542    Imaging: Dg Chest 2 View  Result Date: 12/30/2017 CLINICAL DATA:  Emphysema, dyspnea EXAM: CHEST - 2 VIEW COMPARISON:  12/16/2017 chest radiograph. FINDINGS: Stable cardiomediastinal silhouette with mild cardiomegaly. No pneumothorax. No pleural effusion. Hyperinflated lungs. Slight prominence of parahilar interstitial markings, similar to prior. No acute consolidative airspace disease. Stable mild eventration of the anterior right hemidiaphragm. IMPRESSION: 1. Stable cardiomegaly with slight  prominence of  the parahilar interstitial markings, suggesting mild congestive heart failure, stable from prior chest radiograph. 2. Hyperinflated lungs, suggesting COPD. Electronically Signed   By: Ilona Sorrel M.D.   On: 12/30/2017 11:54   Dg Chest 2 View  Result Date: 12/16/2017 CLINICAL DATA:  Two weeks of worsening dyspnea. History of heart failure, atrial fibrillation, previous MI, COPD, former smoker. EXAM: CHEST - 2 VIEW COMPARISON:  Chest x-ray of July 16, 2016 FINDINGS: The lungs are mildly hyperinflated. The interstitial markings are coarse. There is no alveolar infiltrate or pleural effusion. The cardiac silhouette is enlarged. The pulmonary vascularity is mildly prominent centrally. There is calcification in the wall of the aortic arch in in the mitral valvular annulus. Old right posterior rib fractures are observed. There is multilevel degenerative disc disease of the thoracic spine with mild vertebral body height loss at multiple levels greatest at L1. IMPRESSION: Chronic bronchitic-smoking related changes.  No acute pneumonia. Mild enlargement of the cardiac silhouette with mild pulmonary vascular congestion consistent with low-grade compensated CHF. Thoracic aortic atherosclerosis. Electronically Signed   By: David  Martinique M.D.   On: 12/16/2017 16:07     Assessment & Plan:   Pleasant 84 year old patient seen office today.  Patient feeling much better from respiratory standpoint since last exam.  Will check bmet today.  Patient to continue doing daily weights and monitoring for weight increases.  Patient is to follow-up with primary care this week regarding urinary symptoms.  Keep follow-up with Dr. Lamonte Sakai next month  COPD GOLD II B Continue to Stiolto inhaler Continue rescue inhaler   Allergic rhinitis Continue Singulair Continue Zyrtec Continue Atrovent nasal spray Can consider adding Flonase as needed if nasal congestion returns  Chronic diastolic HF (heart failure)  (HCC) Continue daily Lasix Continue daily weights Contact primary care if lower extremity swelling worsens or weight is increased  Urinary frequency Follow-up with primary care this week for work-up for potential UTI or kidney stone If hematuria develops contact primary care Patient agrees to follow-up with primary care and states that he could probably get him to be seen tomorrow   Do not think emergent work-up is needed right now as CVA tenderness is minor.  And no hematuria is present per patient.     Lauraine Rinne, NP 12/30/2017

## 2017-12-30 ENCOUNTER — Other Ambulatory Visit (INDEPENDENT_AMBULATORY_CARE_PROVIDER_SITE_OTHER): Payer: PPO

## 2017-12-30 ENCOUNTER — Ambulatory Visit (INDEPENDENT_AMBULATORY_CARE_PROVIDER_SITE_OTHER)
Admission: RE | Admit: 2017-12-30 | Discharge: 2017-12-30 | Disposition: A | Discharge status: Home / Self Care | Payer: PPO | Source: Ambulatory Visit | Attending: Pulmonary Disease | Admitting: Pulmonary Disease

## 2017-12-30 ENCOUNTER — Encounter: Payer: Self-pay | Admitting: Pulmonary Disease

## 2017-12-30 ENCOUNTER — Encounter: Payer: Self-pay | Admitting: Internal Medicine

## 2017-12-30 ENCOUNTER — Ambulatory Visit: Payer: PPO | Admitting: Cardiovascular Disease

## 2017-12-30 ENCOUNTER — Ambulatory Visit: Payer: PPO | Admitting: Pulmonary Disease

## 2017-12-30 ENCOUNTER — Encounter: Payer: Self-pay | Admitting: Cardiovascular Disease

## 2017-12-30 VITALS — BP 126/72 | HR 85 | Ht 62.0 in | Wt 140.0 lb

## 2017-12-30 VITALS — BP 130/64 | HR 83 | Ht 62.0 in | Wt 140.8 lb

## 2017-12-30 DIAGNOSIS — I6523 Occlusion and stenosis of bilateral carotid arteries: Secondary | ICD-10-CM | POA: Diagnosis not present

## 2017-12-30 DIAGNOSIS — J439 Emphysema, unspecified: Secondary | ICD-10-CM

## 2017-12-30 DIAGNOSIS — R350 Frequency of micturition: Secondary | ICD-10-CM

## 2017-12-30 DIAGNOSIS — I25118 Atherosclerotic heart disease of native coronary artery with other forms of angina pectoris: Secondary | ICD-10-CM

## 2017-12-30 DIAGNOSIS — I5032 Chronic diastolic (congestive) heart failure: Secondary | ICD-10-CM | POA: Diagnosis not present

## 2017-12-30 DIAGNOSIS — I119 Hypertensive heart disease without heart failure: Secondary | ICD-10-CM

## 2017-12-30 DIAGNOSIS — R0602 Shortness of breath: Secondary | ICD-10-CM | POA: Diagnosis not present

## 2017-12-30 DIAGNOSIS — R7989 Other specified abnormal findings of blood chemistry: Secondary | ICD-10-CM | POA: Diagnosis not present

## 2017-12-30 DIAGNOSIS — J301 Allergic rhinitis due to pollen: Secondary | ICD-10-CM

## 2017-12-30 DIAGNOSIS — I482 Chronic atrial fibrillation, unspecified: Secondary | ICD-10-CM

## 2017-12-30 LAB — BASIC METABOLIC PANEL
BUN: 28 mg/dL — ABNORMAL HIGH (ref 6–23)
CHLORIDE: 101 meq/L (ref 96–112)
CO2: 28 mEq/L (ref 19–32)
CREATININE: 1.08 mg/dL (ref 0.40–1.50)
Calcium: 9.6 mg/dL (ref 8.4–10.5)
GFR: 69.12 mL/min (ref >60.00)
Glucose, Bld: 90 mg/dL (ref 70–99)
Potassium: 4.1 mEq/L (ref 3.5–5.1)
Sodium: 136 mEq/L (ref 135–145)

## 2017-12-30 NOTE — Assessment & Plan Note (Signed)
Continue Singulair Continue Zyrtec Continue Atrovent nasal spray Can consider adding Flonase as needed if nasal congestion returns

## 2017-12-30 NOTE — Patient Instructions (Addendum)
Follow-up with primary care regarding urinary symptoms Keep follow-up with Dr. Lamonte Sakai Continue Stiolto inhaler Continue rescue inhaler Continue Lasix daily We will check bmet  Please contact the office if your symptoms worsen or you have concerns that you are not improving.   Thank you for choosing [facility] for your healthcare, and for allowing [place] to partner with you on your healthcare journey. I am thankful to be able to provide care to you today.   Wyn Quaker FNP-C

## 2017-12-30 NOTE — Assessment & Plan Note (Signed)
Continue daily Lasix Continue daily weights Contact primary care if lower extremity swelling worsens or weight is increased

## 2017-12-30 NOTE — Assessment & Plan Note (Signed)
Follow-up with primary care this week for work-up for potential UTI or kidney stone If hematuria develops contact primary care Patient agrees to follow-up with primary care and states that he could probably get him to be seen tomorrow   Do not think emergent work-up is needed right now as CVA tenderness is minor.  And no hematuria is present per patient.

## 2017-12-30 NOTE — Patient Instructions (Signed)

## 2017-12-30 NOTE — Assessment & Plan Note (Signed)
Continue to [employer] inhaler Continue rescue inhaler

## 2017-12-30 NOTE — Progress Notes (Signed)
Discussed results with patient in office.  Nothing further is needed at this time.  Brian Mack FNP  

## 2017-12-30 NOTE — Progress Notes (Signed)
Lab results are normal.  Mild BUN elevation.  We will continue to monitor.  This is actually improving over the past 6 months.  No further changes at this time.  Continue with follow-up with primary care for urinary symptoms.  Wyn Quaker FNP

## 2017-12-31 ENCOUNTER — Encounter: Payer: PPO | Admitting: Physician Assistant

## 2017-12-31 ENCOUNTER — Ambulatory Visit (INDEPENDENT_AMBULATORY_CARE_PROVIDER_SITE_OTHER): Payer: PPO | Admitting: Family Medicine

## 2017-12-31 ENCOUNTER — Encounter: Payer: Self-pay | Admitting: Family Medicine

## 2017-12-31 VITALS — BP 146/80 | HR 87 | Temp 98.2°F | Ht 62.0 in | Wt 131.0 lb

## 2017-12-31 DIAGNOSIS — R109 Unspecified abdominal pain: Secondary | ICD-10-CM

## 2017-12-31 DIAGNOSIS — L97812 Non-pressure chronic ulcer of other part of right lower leg with fat layer exposed: Secondary | ICD-10-CM | POA: Diagnosis not present

## 2017-12-31 DIAGNOSIS — K5900 Constipation, unspecified: Secondary | ICD-10-CM

## 2017-12-31 DIAGNOSIS — R300 Dysuria: Secondary | ICD-10-CM

## 2017-12-31 LAB — POC URINALSYSI DIPSTICK (AUTOMATED)
Bilirubin, UA: NEGATIVE
Blood, UA: NEGATIVE
GLUCOSE UA: NEGATIVE
Ketones, UA: NEGATIVE
Leukocytes, UA: NEGATIVE
Nitrite, UA: NEGATIVE
Protein, UA: NEGATIVE
Spec Grav, UA: 1.015 (ref 1.010–1.025)
Urobilinogen, UA: 0.2 E.U./dL
pH, UA: 6 (ref 5.0–8.0)

## 2017-12-31 MED ORDER — CIPROFLOXACIN HCL 500 MG PO TABS: 500.0000 mg | ORAL_TABLET | Freq: Two times a day (BID) | ORAL | 0 refills | Status: DC

## 2017-12-31 NOTE — Patient Instructions (Signed)
Complete course of Cipro to cover for possible prostate infection.  Use miralax daily for constipation and can add colace if needed for s few days.  keep up with water and fiber in diet.

## 2017-12-31 NOTE — Assessment & Plan Note (Signed)
No clear sign of UTI or pyelo. Given difficulty with urine stream and feeling of having to pee through a blockage... ? Prostate infeciton.  No sign of urethritis.  treat with cipro x 10 days.. Follow up as needed if not improving.

## 2017-12-31 NOTE — Progress Notes (Signed)
   Subjective:    Patient ID: Eduardo [name], male    DOB: September 05, 1930, 84 y.o.   MRN: [id number]  HPI   84 year old male pt of Dr. [name] with history of afib,  HTN, COPD presents with new onset pain in right mid abdomen ongoing x 2 weeks. Dull pain.  Pain is worsen with sitting in a chair.. Better with getting in a  more comfortable chair. Occ sharp pain 8/10 on pain scale lasts few seconds.   Pain at the beginning of urine stream... Burning. Pain is inside the penis not at the tip. No blood in urine. He always has to go a lot on lasix.  Hs has had some constipation.. Using miralax. New meds: none   Was on antibiotic 1 week ago. Recent fluid on lung, afibSocial History /Family History/Past Medical History reviewed in detail and updated in EMR if needed. Blood pressure (!) 146/80, pulse 87, temperature 98.2 F (36.8 C), temperature source Oral, height 5\' 2"  (1.575 m), weight 131 lb (59.4 kg), SpO2 96 %.  GFR 69 last check.  Review of Systems  Constitutional: Negative for fatigue and fever.  HENT: Negative for ear pain.   Eyes: Negative for pain.  Respiratory: Positive for shortness of breath. Negative for cough.   Cardiovascular: Negative for chest pain, palpitations and leg swelling.  Gastrointestinal: Negative for abdominal pain.  Genitourinary: Negative for dysuria.  Musculoskeletal: Negative for arthralgias.  Neurological: Negative for syncope, light-headedness and headaches.  Psychiatric/Behavioral: Negative for dysphoric mood.       Objective:   Physical Exam  Constitutional: Vital signs are normal. He appears well-developed and well-nourished.  HENT:  Head: Normocephalic.  Right Ear: Hearing normal.  Left Ear: Hearing normal.  Nose: Nose normal.  Mouth/Throat: Oropharynx is clear and moist and mucous membranes are normal.  Neck: Trachea normal. Carotid bruit is not present. No thyroid mass and no thyromegaly present.  Cardiovascular: Normal rate, regular  rhythm and normal pulses. Exam reveals no gallop, no distant heart sounds and no friction rub.  No murmur heard. No peripheral edema  Pulmonary/Chest: Effort normal and breath sounds normal. No respiratory distress.  Abdominal: Normal appearance and bowel sounds are normal. He exhibits distension. There is no tenderness. There is no rigidity, no rebound, no guarding and no CVA tenderness.  Genitourinary: Penis normal. Uncircumcised. No hypospadias, penile erythema or penile tenderness. No discharge found.  Genitourinary Comments: No perineal pain  Skin: Skin is warm, dry and intact. No rash noted.  Psychiatric: He has a normal mood and affect. His speech is normal and behavior is normal. Thought content normal.          Assessment & Plan:

## 2017-12-31 NOTE — Assessment & Plan Note (Signed)
May be secondary to inactivity, meds and possible prostatitis.  Take miralax daily , increase fiber and water gradually.  Can add coloac as needed.

## 2018-01-07 ENCOUNTER — Encounter: Payer: PPO | Admitting: Physician Assistant

## 2018-01-07 DIAGNOSIS — L97212 Non-pressure chronic ulcer of right calf with fat layer exposed: Secondary | ICD-10-CM | POA: Diagnosis not present

## 2018-01-07 DIAGNOSIS — L97812 Non-pressure chronic ulcer of other part of right lower leg with fat layer exposed: Secondary | ICD-10-CM | POA: Diagnosis not present

## 2018-01-08 ENCOUNTER — Ambulatory Visit (INDEPENDENT_AMBULATORY_CARE_PROVIDER_SITE_OTHER): Payer: PPO

## 2018-01-08 ENCOUNTER — Ambulatory Visit (INDEPENDENT_AMBULATORY_CARE_PROVIDER_SITE_OTHER): Payer: PPO | Admitting: Orthopaedic Surgery

## 2018-01-08 ENCOUNTER — Encounter (INDEPENDENT_AMBULATORY_CARE_PROVIDER_SITE_OTHER): Payer: Self-pay | Admitting: Orthopaedic Surgery

## 2018-01-08 DIAGNOSIS — M545 Low back pain: Secondary | ICD-10-CM

## 2018-01-08 DIAGNOSIS — S32010D Wedge compression fracture of first lumbar vertebra, subsequent encounter for fracture with routine healing: Secondary | ICD-10-CM | POA: Diagnosis not present

## 2018-01-08 NOTE — Progress Notes (Signed)
Office Visit Note   Patient: Eduardo [name]           Date of Birth: [birth year]           MRN: [id number] Visit Date: 01/08/2018              Requested by: Venia Carbon, MD [address] [place], [place] [address] PCP: Venia Carbon, MD   Assessment & Plan: Visit Diagnoses:  1. Low back pain, unspecified back pain laterality, unspecified chronicity, with sciatica presence unspecified   2. Closed compression fracture of L1 lumbar vertebra with routine healing, subsequent encounter     Plan: Given his x-ray findings showing no change from previous films in light of his significant compression deformity, we will continue to treat him conservatively.  He is doing much better overall.  I would like to see him back in 3 weeks with a standing AP and lateral of the lumbar spine.  Obviously if this worsens before then and is having significant pain I would send him to the interventional radiologist for a kyphoplasty.  However he is on blood thinning medication that would need to be delayed for a few days.  We will potentially even need to obtain an MRI.  However again clinically he is doing so well right now that we will continue to be as conservative as possible.  He is going to try to refrain from any strenuous activities between now only see him back.  Again in 3 weeks I like a standing AP and lateral lumbar spine.  Follow-Up Instructions: Return in about 3 weeks (around 01/29/2018).   Orders:  Orders Placed This Encounter  Procedures  . XR Lumbar Spine 2-3 Views   No orders of the defined types were placed in this encounter.     Procedures: No procedures performed   Clinical Data: No additional findings.   Subjective: Chief Complaint  Patient presents with  . Lower Back - Pain  Patient is a very pleasant 84 year old gentleman sent from Dr. [name] to evaluate and L1 compression fracture.  This occurred after an acute event.  He fell against the wall and then  had axial compression load landing on his bottom.  He had significant back pain and x-rays obtained on 12/17/2017 that are with him today show an L1 compression fracture.  He does use a cane to ambulate.  He denies any significant weakness in his leg.  He does have peripheral neuropathy which is long-standing from lumbar stenosis according to he and his wife.  He says that he slept in a bed yesterday for the first time in a few weeks and that his pain is subsiding significantly.  HPI  Review of Systems He currently denies any change in bowel bladder function.  Denies any chest pain or shortness of breath.  He denies any fever and chills.  Objective: Vital Signs: There were no vitals taken for this visit.  Physical Exam He is alert and oriented x3 and in no acute distress Ortho Exam He does get up very slowly from a chair.  When I compress and palpate along the lumbar spine he does have pain in the upper lumbar spine.  This does hurt with flexion extension as well.  He does ambulate down the hall though more easily with his cane. Specialty Comments:  No specialty comments available.  Imaging: Xr Lumbar Spine 2-3 Views  Result Date: 01/08/2018 2 views lumbar spine showed significant degenerative changes throughout the lumbar  spine.  There is an L1 compression fracture that is also significant amount of height.  This is compared to x-rays the patient brought from 3 weeks ago.  There is been no change in alignment and loss of height.  2 views of the lumbar spine independently reviewed from 12/17/2017 shows a significant lumbar compression fracture at L1.  The body height is lost about 7580% of its height.  There is no malalignment of the lumbar spine the AP or lateral planes.  There is chronic degenerative changes in the lumbar spine.  PMFS History: Patient Active Problem List   Diagnosis Date Noted  . Dysuria 12/31/2017  . Constipation 12/31/2017  . Urinary frequency 12/30/2017  . CAD  (coronary artery disease), native coronary artery 12/28/2017  . Bilateral carotid artery stenosis 12/04/2017  . Lumbar back sprain, initial encounter 12/04/2017  . Laceration of right leg excluding thigh 10/23/2017  . Acute non-recurrent sinusitis 10/10/2017  . Alcohol dependence in remission ([place]) 06/24/2017  . Imbalance 02/05/2017  . Mitral regurgitation 07/17/2016  . Dyslipidemia 07/14/2016  . Encounter for anticoagulation discussion and counseling 05/03/2015  . Chronic anticoagulation 03/01/2015  . 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 TIA (transient ischemic attack) 12/31/2014  . Advanced directives, counseling/discussion 04/19/2014  . Spinal stenosis, lumbar region, with neurogenic claudication 12/23/2012  . Osteoarthritis, multiple sites 10/08/2012  . Preventative health care 02/25/2012  . MI, old with prior NSTEMI x 2 12/28/2011  . Chronic diastolic HF (heart failure) ([place]) 12/28/2011    Class: Acute  . Allergic rhinitis 11/11/2008  . Mood disorder ([place]) 04/14/2008  . Essential hypertension 04/14/2008  . Chronic atrial fibrillation ([place]) 04/14/2008  . COPD GOLD II B 04/14/2008   Past Medical History:  Diagnosis Date  . Allergy   . Anemia 2012, 08/2015   chronic. Acute due to large hematoma 2013. 2017: due to gastric AVM bleeding.   . Anxiety   . Atrial fibrillation ([place]) 2012   a.  CHA2DS2VASc = 6-->eliquis;  b. 12/2015 Echo: EF 60-65%, no rwma, LVH, mild AS/MS/MR, sev dil LA, mild TR, PASP 40mmHg.  Marland Kitchen CAD (coronary artery disease)    a. 06/2010 Cath: LM nl, LAD 50p/m, LCX 56m, RCA 50p/m -->catheter dissection but good flow, 70d, RPDA 50-70;  b. 06/2010 Relook cath due to bradycardia and inf ST elev: RCA dissection @ ostium extending into coronary cusp and prox RCA, Ao dissection-->less staining than previously-->Med Rx.  Marland Kitchen COPD (chronic obstructive pulmonary disease) ([place]) 2013   exertional dyspnea  . Gastric angiodysplasia with hemorrhage 08/2015  .  Hx of colonic polyps 2001, 2011   adenomatous 2001. HP 2001, 2011.  Marland Kitchen Hyperlipidemia   . Myocardial infarction ([place]) 06/2010.  12/2011.   "twice; 1 day apart; during/after cath". NQMI in setting severe anemia 2013.   Marland Kitchen Neuropathy 2014   in feet, likely due to spinal stenosis, DDD, HNP  . Osteoarthritis 2002   spinal stenosis, spondylolisthesis, disc protrusion multilevel in lumbar spine  . Stroke ([place]) 2016  . Upper GI bleed 08/2015   EGD: 2 small gastric AVMs, 1 actively bleeding.  Both treated with APC ablation, clipping of the bleeder    Family History  Problem Relation Age of Onset  . Cancer Mother        ? leukemia  . Heart attack Father   . Stroke Maternal Grandmother   . Diabetes Neg Hx   . Hypertension Neg Hx     Past Surgical History:  Procedure Laterality Date  .  APPENDECTOMY    . CARDIAC CATHETERIZATION  2012   50% LAD and luminal irreg in RCA, 1/12  Post cath MI from damage  . CARPAL TUNNEL RELEASE     left  . CARPAL TUNNEL RELEASE  10/11/11   Dr Rush Barer  . CATARACT EXTRACTION W/ INTRAOCULAR LENS IMPLANT  2/13   Dr [name]  . COLONOSCOPY  2001, 02/2010   Dr Deatra Ina.   . ESOPHAGOGASTRODUODENOSCOPY N/A 08/15/2015   [name] MD; 2 small gastric AVMs, 1 actively bleeding.  Both treated with APC ablation, clipping of the bleeder  . FOOT FRACTURE SURGERY     right heel repair  . FRACTURE SURGERY    . KNEE ARTHROSCOPY     left, right knee 10/2009  . MASTOIDECTOMY     x3 on right  . NASAL SEPTUM SURGERY     nasal septal repair  . PENILE PROSTHESIS  REMOVAL    . PENILE PROSTHESIS IMPLANT     x 2--got infection after 2nd and had to remove  . PILONIDAL CYST / SINUS EXCISION    . SHOULDER ARTHROSCOPY     right  . TONSILLECTOMY AND ADENOIDECTOMY     Social History   Occupational History  . Occupation: Retired Chief Strategy Officer the job trainin)  Tobacco Use  . Smoking status: Former Smoker    Packs/day: 2.50    Years: 30.00    Pack years:  75.00    Types: Cigarettes    Last attempt to quit: 06/11/1978    Years since quitting: 39.6  . Smokeless tobacco: Never Used  Substance and Sexual Activity  . Alcohol use: No    Comment: QUIT 1504 alcoholic  . Drug use: No  . Sexual activity: Not Currently

## 2018-01-14 ENCOUNTER — Encounter: Payer: PPO | Attending: Nurse Practitioner | Admitting: Nurse Practitioner

## 2018-01-14 DIAGNOSIS — Z872 Personal history of diseases of the skin and subcutaneous tissue: Secondary | ICD-10-CM | POA: Diagnosis not present

## 2018-01-14 DIAGNOSIS — L97819 Non-pressure chronic ulcer of other part of right lower leg with unspecified severity: Secondary | ICD-10-CM | POA: Diagnosis not present

## 2018-01-14 DIAGNOSIS — I2510 Atherosclerotic heart disease of native coronary artery without angina pectoris: Secondary | ICD-10-CM | POA: Diagnosis not present

## 2018-01-14 DIAGNOSIS — I480 Paroxysmal atrial fibrillation: Secondary | ICD-10-CM | POA: Insufficient documentation

## 2018-01-14 DIAGNOSIS — Z7901 Long term (current) use of anticoagulants: Secondary | ICD-10-CM | POA: Insufficient documentation

## 2018-01-14 DIAGNOSIS — J449 Chronic obstructive pulmonary disease, unspecified: Secondary | ICD-10-CM | POA: Insufficient documentation

## 2018-01-14 DIAGNOSIS — Z09 Encounter for follow-up examination after completed treatment for conditions other than malignant neoplasm: Secondary | ICD-10-CM | POA: Insufficient documentation

## 2018-01-19 NOTE — Progress Notes (Signed)
GEFFREY, MICHAELSEN ([id number]) Visit Report for 01/07/2018 Arrival Information Details Patient Name: ANGELICA, WIX. Date of Service: 01/07/2018 1:45 PM Medical Record Number: [id number] Patient Account Number: [id number] Date of Birth/Sex: 1930/08/18 (84 y.o. M) Treating RN: [name] Primary Care Luanna Weesner: [name] Other Clinician: Referring Detroit Frieden: [name] Treating Deniqua Perry/Extender: Melburn Hake, HOYT Weeks in Treatment: 8 Visit Information History Since Last Visit Added or deleted any medications: No Patient Arrived: Ambulatory Any new allergies or adverse reactions: No Arrival Time: 14:10 Had a fall or experienced change in No Accompanied By: husband activities of daily living that may affect Transfer Assistance: None risk of falls: Patient Identification Verified: Yes Signs or symptoms of abuse/neglect since last visito No Secondary Verification Process Yes Hospitalized since last visit: No Completed: Implantable device outside of the clinic excluding No Patient Requires Transmission-Based No cellular tissue based products placed in the center Precautions: since last visit: Patient Has Alerts: Yes Has Dressing in Place as Prescribed: Yes Patient Alerts: Patient on Blood Has Compression in Place as Prescribed: Yes Thinner Pain Present Now: No Eliquis 11/08/17 ABI >220 Biateral Electronic Signature(s) Signed: 01/07/2018 5:54:46 PM By: [name] Entered By: [name] on 01/07/2018 14:11:35 Vittorio, Anav M. ([id number]) -------------------------------------------------------------------------------- Encounter Discharge Information Details Patient Name: [name]. Date of Service: 01/07/2018 1:45 PM Medical Record Number: [id number] Patient Account Number: [id number] Date of Birth/Sex: 12-11-1930 (84 y.o. M) Treating RN: Roger Shelter Primary Care Cherise Fedder: [name] Other Clinician: Referring Cordarious Zeek: [name] Treating Barrington Worley/Extender: Melburn Hake, HOYT Weeks in Treatment: 8 Encounter Discharge Information Items Discharge Condition: Stable Ambulatory Status: Walker Discharge Destination: Home Transportation: Private Auto Accompanied By: spouse Schedule Follow-up Appointment: Yes Clinical Summary of Care: Electronic Signature(s) Signed: 01/08/2018 5:09:20 PM By: Roger Shelter Entered By: Roger Shelter on 01/07/2018 15:02:21 Bohle, Timur Jerilynn Mages ([id number]) -------------------------------------------------------------------------------- Lower Extremity Assessment Details Patient Name: [name]. Date of Service: 01/07/2018 1:45 PM Medical Record Number: [id number] Patient Account Number: [id number] Date of Birth/Sex: 1931/01/12 (84 y.o. M) Treating RN: [name] Primary Care Mustafa Potts: [name] Other Clinician: Referring Ryelan Kazee: [name] Treating Rubby Barbary/Extender: Melburn Hake, HOYT Weeks in Treatment: 8 Edema Assessment Assessed: [Left: No] [Right: No] [Left: Edema] [Right: :] Calf Left: Right: Point of Measurement: 30 cm From Medial Instep 24.8 cm cm Ankle Left: Right: Point of Measurement: 10 cm From Medial Instep 19.2 cm cm Vascular Assessment Pulses: Dorsalis Pedis Palpable: [Left:Yes] Posterior Tibial Extremity colors, hair growth, and conditions: Extremity Color: [Left:Normal] Hair Growth on Extremity: [Left:Yes] Temperature of Extremity: [Left:Warm] Capillary Refill: [Left:< 3 seconds] Toe Nail Assessment Left: Right: Thick: Yes Discolored: Yes Deformed: No Improper Length and Hygiene: No Electronic Signature(s) Signed: 01/07/2018 5:54:46 PM By: [name] Entered By: [name] on 01/07/2018 14:21:33 Mangine, Galan M. ([id number]) -------------------------------------------------------------------------------- Multi Wound Chart Details Patient Name: [name]. Date of Service: 01/07/2018 1:45  PM Medical Record Number: [id number] Patient Account Number: [id number] Date of Birth/Sex: 03-30-1931 (84 y.o. M) Treating RN: Cornell Barman Primary Care Calypso Hagarty: [name] Other Clinician: Referring Takenya Travaglini: [name] Treating Jennyfer Nickolson/Extender: Melburn Hake, HOYT Weeks in Treatment: 8 Vital Signs Height(in): 62 Pulse(bpm): 35 Weight(lbs): 153.7 Blood Pressure(mmHg): 133/46 Body Mass Index(BMI): 28 Temperature(F): 97.8 Respiratory Rate 18 (breaths/min): Photos: [1:No Photos] [N/A:N/A] Wound Location: [1:Right Lower Leg] [N/A:N/A] Wounding Event: [1:Trauma] [N/A:N/A] Primary Etiology: [1:Trauma, Other] [N/A:N/A] Comorbid History: [1:Glaucoma, Chronic Obstructive Pulmonary Disease (COPD), Arrhythmia, Congestive Heart Failure, Coronary Artery Disease, Myocardial Infarction, Neuropathy] [N/A:N/A] Date Acquired: [1:10/15/2017] [N/A:N/A] Weeks of Treatment: [1:8] [N/A:N/A] Wound  Status: [1:Open] [N/A:N/A] Measurements L x W x D [1:0.3x0.3x0.1] [N/A:N/A] (cm) Area (cm) : [1:0.071] [N/A:N/A] Volume (cm) : [1:0.007] [N/A:N/A] % Reduction in Area: [1:98.60%] [N/A:N/A] % Reduction in Volume: [1:99.70%] [N/A:N/A] Classification: [1:Full Thickness Without Exposed Support Structures] [N/A:N/A] Exudate Amount: [1:Large] [N/A:N/A] Exudate Type: [1:Serosanguineous] [N/A:N/A] Exudate Color: [1:red, brown] [N/A:N/A] Wound Margin: [1:Thickened] [N/A:N/A] Granulation Amount: [1:None Present (0%)] [N/A:N/A] Necrotic Amount: [1:Large (67-100%)] [N/A:N/A] Necrotic Tissue: [1:Eschar] [N/A:N/A] Exposed Structures: [1:Fat Layer (Subcutaneous Tissue) Exposed: Yes Fascia: No Tendon: No Muscle: No Joint: No Bone: No] [N/A:N/A] Epithelialization: [1:None] [N/A:N/A] Periwound Skin Texture: Induration: Yes N/A N/A Callus: Yes Excoriation: No Crepitus: No Rash: No Scarring: No Periwound Skin Moisture: Maceration: No N/A N/A Dry/Scaly: No Periwound Skin Color: Erythema: Yes N/A  N/A Atrophie Blanche: No Cyanosis: No Ecchymosis: No Hemosiderin Staining: No Mottled: No Pallor: No Rubor: No Erythema Location: Circumferential N/A N/A Temperature: No Abnormality N/A N/A Tenderness on Palpation: Yes N/A N/A Wound Preparation: Ulcer Cleansing: N/A N/A Rinsed/Irrigated with Saline, Other: soap and water Topical Anesthetic Applied: Other: lidocaine 4% Treatment Notes Electronic Signature(s) Signed: 01/10/2018 6:17:48 PM By: Gretta Cool, BSN, RN, CWS, Kim RN, BSN Entered By: Gretta Cool, BSN, RN, CWS, Kim on 01/07/2018 14:39:23 Kehres, Calla Kicks ([id number]) -------------------------------------------------------------------------------- Multi-Disciplinary Care Plan Details Patient Name: TRESEAN, MATTIX. Date of Service: 01/07/2018 1:45 PM Medical Record Number: [id number] Patient Account Number: [id number] Date of Birth/Sex: [birth year] (84 y.o. M) Treating RN: Cornell Barman Primary Care Lemoyne Scarpati: [name] Other Clinician: Referring Nycole Kawahara: [name] Treating Carzell Saldivar/Extender: Melburn Hake, HOYT Weeks in Treatment: 8 Active Inactive ` Abuse / Safety / Falls / Self Care Management Nursing Diagnoses: Potential for falls Goals: Patient will remain injury free related to falls Date Initiated: 11/08/2017 Target Resolution Date: 01/17/2018 Goal Status: Active Interventions: Assess fall risk on admission and as needed Notes: ` Orientation to the Wound Care Program Nursing Diagnoses: Knowledge deficit related to the wound healing center program Goals: Patient/caregiver will verbalize understanding of the [facility] Date Initiated: 11/08/2017 Target Resolution Date: 01/17/2018 Goal Status: Active Interventions: Provide education on orientation to the wound center Notes: ` Wound/Skin Impairment Nursing Diagnoses: Impaired tissue integrity Goals: Ulcer/skin breakdown will heal within 14 weeks Date Initiated: 11/08/2017 Target Resolution  Date: 01/17/2018 Goal Status: Active Interventions: ABED, SCHAR ([id number]) Assess patient/caregiver ability to obtain necessary supplies Assess patient/caregiver ability to perform ulcer/skin care regimen upon admission and as needed Assess ulceration(s) every visit Notes: Electronic Signature(s) Signed: 01/10/2018 6:17:48 PM By: Gretta Cool, BSN, RN, CWS, Kim RN, BSN Entered By: Gretta Cool, BSN, RN, CWS, Kim on 01/07/2018 14:38:31 Laduca, Ormond M. ([id number]) -------------------------------------------------------------------------------- Pain Assessment Details Patient Name: [name]. Date of Service: 01/07/2018 1:45 PM Medical Record Number: [id number] Patient Account Number: [id number] Date of Birth/Sex: [birth year] (84 y.o. M) Treating RN: [name] Primary Care Cristofer Yaffe: [name] Other Clinician: Referring Jawad Wiacek: [name] Treating Rui Wordell/Extender: Melburn Hake, HOYT Weeks in Treatment: 8 Active Problems Location of Pain Severity and Description of Pain Patient Has Paino No Site Locations Pain Management and Medication Current Pain Management: Electronic Signature(s) Signed: 01/07/2018 5:54:46 PM By: [name] Entered By: [name] on 01/07/2018 14:12:36 Fulginiti, Lovelace M. ([id number]) -------------------------------------------------------------------------------- Patient/Caregiver Education Details Patient Name: [name]. Date of Service: 01/07/2018 1:45 PM Medical Record Number: [id number] Patient Account Number: [id number] Date of Birth/Gender: February 27, 1931 (84 y.o. M) Treating RN: Roger Shelter Primary Care Physician: [name] Other Clinician: Referring Physician: [name] Treating Physician/Extender: Sharalyn Ink in Treatment: 8 Education Assessment Education  Provided To: Patient Education Topics Provided Wound/Skin Impairment: Handouts: Caring for Your Ulcer Methods:  Explain/Verbal Responses: State content correctly Electronic Signature(s) Signed: 01/08/2018 5:09:20 PM By: Roger Shelter Entered By: Roger Shelter on 01/07/2018 15:02:43 Reierson, Algie M. ([id number]) -------------------------------------------------------------------------------- Wound Assessment Details Patient Name: [name]. Date of Service: 01/07/2018 1:45 PM Medical Record Number: [id number] Patient Account Number: [id number] Date of Birth/Sex: 11/17/1930 (84 y.o. M) Treating RN: [name] Primary Care Junius Faucett: [name] Other Clinician: Referring Randy Castrejon: [name] Treating Delayza Lungren/Extender: Melburn Hake, HOYT Weeks in Treatment: 8 Wound Status Wound Number: 1 Primary Trauma, Other Etiology: Wound Location: Right Lower Leg Wound Open Wounding Event: Trauma Status: Date Acquired: 10/15/2017 Comorbid Glaucoma, Chronic Obstructive Pulmonary Weeks Of Treatment: 8 History: Disease (COPD), Arrhythmia, Congestive Heart Clustered Wound: No Failure, Coronary Artery Disease, Myocardial Infarction, Neuropathy Wound Measurements Length: (cm) 0.3 Width: (cm) 0.3 Depth: (cm) 0.1 Area: (cm) 0.071 Volume: (cm) 0.007 % Reduction in Area: 98.6% % Reduction in Volume: 99.7% Epithelialization: None Tunneling: No Undermining: No Wound Description Full Thickness Without Exposed Support Foul Od Classification: Structures Slough/ Wound Margin: Thickened Exudate Large Amount: Exudate Type: Serosanguineous Exudate Color: red, brown or After Cleansing: No Fibrino Yes Wound Bed Granulation Amount: None Present (0%) Exposed Structure Necrotic Amount: Large (67-100%) Fascia Exposed: No Necrotic Quality: Eschar Fat Layer (Subcutaneous Tissue) Exposed: Yes Tendon Exposed: No Muscle Exposed: No Joint Exposed: No Bone Exposed: No Periwound Skin Texture Texture Color No Abnormalities Noted: No No Abnormalities Noted: No Callus: Yes Atrophie  Blanche: No Crepitus: No Cyanosis: No Excoriation: No Ecchymosis: No Induration: Yes Erythema: Yes Rash: No Erythema Location: Circumferential Scarring: No Hemosiderin Staining: No Mottled: No Moisture Pallor: No No Abnormalities Noted: No Arca, Kentravious M. ([id number]) Dry / Scaly: No Rubor: No Maceration: No Temperature / Pain Temperature: No Abnormality Tenderness on Palpation: Yes Wound Preparation Ulcer Cleansing: Rinsed/Irrigated with Saline, Other: soap and water, Topical Anesthetic Applied: Other: lidocaine 4%, Electronic Signature(s) Signed: 01/07/2018 5:54:46 PM By: [name] Entered By: [name] on 01/07/2018 14:24:21 Kamel, Dawit M. ([id number]) -------------------------------------------------------------------------------- [place] Details Patient Name: [name]. Date of Service: 01/07/2018 1:45 PM Medical Record Number: [id number] Patient Account Number: [id number] Date of Birth/Sex: 05/18/1931 (84 y.o. M) Treating RN: [name] Primary Care Jovanna Hodges: [name] Other Clinician: Referring Chanin Frumkin: [name] Treating Benedetto Ryder/Extender: Melburn Hake, HOYT Weeks in Treatment: 8 Vital Signs Time Taken: 14:12 Temperature (F): 97.8 Height (in): 62 Pulse (bpm): 76 Weight (lbs): 153.7 Respiratory Rate (breaths/min): 18 Body Mass Index (BMI): 28.1 Blood Pressure (mmHg): 133/46 Reference Range: 80 - 120 mg / dl Electronic Signature(s) Signed: 01/07/2018 5:54:46 PM By: [name] Entered By: [name] on 01/07/2018 14:14:26

## 2018-01-20 ENCOUNTER — Other Ambulatory Visit: Payer: Self-pay | Admitting: *Deleted

## 2018-01-20 MED ORDER — HYDRALAZINE HCL 10 MG PO TABS: 10.0000 mg | ORAL_TABLET | Freq: Three times a day (TID) | ORAL | 3 refills | Status: DC

## 2018-01-22 NOTE — Progress Notes (Signed)
Eduardo [name], Eduardo [name] ([id number]) Visit Report for 01/07/2018 Chief Complaint Document Details Patient Name: Eduardo [name]. Date of Service: 01/07/2018 1:45 PM Medical Record Number: [id number] Patient Account Number: [id number] Date of Birth/Sex: 10/10/1930 (84 y.o. M) Treating RN: Ahmed Prima Primary Care Provider: [name] Other Clinician: Referring Provider: [name] Treating Provider/Extender: Melburn Hake, HOYT Weeks in Treatment: 8 Information Obtained from: Patient Chief Complaint Right lower leg ulcer due to trauma Electronic Signature(s) Signed: 01/07/2018 9:24:47 PM By: Worthy Keeler PA-C Entered By: Worthy Keeler on 01/07/2018 13:51:01 [name], [name] ([id number]) -------------------------------------------------------------------------------- Debridement Details Patient Name: Eduardo [name]. Date of Service: 01/07/2018 1:45 PM Medical Record Number: [id number] Patient Account Number: [id number] Date of Birth/Sex: [birth year] (84 y.o. M) Treating RN: Roger Shelter Primary Care Provider: [name] Other Clinician: Referring Provider: [name] Treating Provider/Extender: Melburn Hake, HOYT Weeks in Treatment: 8 Debridement Performed for Wound #1 Right Lower Leg Assessment: Performed By: Physician STONE III, HOYT E., PA-C Debridement Type: Debridement Pre-procedure Verification/Time Yes - 14:37 Out Taken: Start Time: 14:37 Pain Control: Other : lidocaine 4% Total Area Debrided (Eduardo [name] x W): 0.3 (cm) x 0.3 (cm) = 0.09 (cm) Tissue and other material Non-Viable, Eschar debrided: Level: Non-Viable Tissue Debridement Description: Selective/Open Wound Instrument: Curette Bleeding: None Response to Treatment: Procedure was tolerated well Level of Consciousness: Awake and Alert Post Debridement Measurements of Total Wound Length: (cm) 0.2 Width: (cm) 0.2 Depth: (cm) 0.1 Volume: (cm) 0.003 Character of Wound/Ulcer Post  Debridement: Improved Post Procedure Diagnosis Same as Pre-procedure Electronic Signature(s) Signed: 01/07/2018 9:24:47 PM By: Worthy Keeler PA-C Signed: 01/08/2018 5:09:20 PM By: Roger Shelter Entered By: Roger Shelter on 01/07/2018 14:57:25 [name], [name] ([id number]) -------------------------------------------------------------------------------- HPI Details Patient Name: Eduardo [name]. Date of Service: 01/07/2018 1:45 PM Medical Record Number: [id number] Patient Account Number: [id number] Date of Birth/Sex: [birth year] (84 y.o. M) Treating RN: Ahmed Prima Primary Care Provider: [name] Other Clinician: Referring Provider: [name] Treating Provider/Extender: Melburn Hake, HOYT Weeks in Treatment: 8 History of Present Illness HPI Description: 11/08/17 on evaluation today patient presents initially concerning a right dramatic lower extremity ulcer/skin tear which occurred three weeks ago. He states that he initially was seen in urgent care where the area was sutured closed. The sutures stayed in for two weeks he tells me. Subsequently he was seen at Dr. Alla German office where the sutures were removed and Steri-Strips were placed over the next five days. Subsequently these were also removed and unfortunately at this time it was noted that the patient had a dehisced larger wound that required further attention. The patient and his wife actually requested referral to the wound care center in [place]. [name] made the referral to [place]. Subsequently the patient does have a history of atrial fibrillation, cardiovascular disease, COPD, stroke, and is on long-term Eliquis. There's no history of diabetes that he states. Subsequently he has no other major medical problems currently. No fevers, chills, nausea, or vomiting noted at this time. 11/14/17-He is seen in follow-up evaluation for right posterior lower extremity ulcer. There is improvement with granulation tissue  present; debridement. We will continue with same treatment plan he'll follow-up next week. The vascular office did call to establish an appointment, but he forgot that we had referred him and did not make an appointment. He was encouraged to contact the office to schedule an appointment for evaluation. 11/22/17 on evaluation today patient actually appears to be doing well in regard to his ulcer. This has cleaned up very  nicely in my pinion I do believe the Iodoflex has been of benefit for him. He actually does have his arterial study which is scheduled for 11/27/17. In general he's having no significant discomfort mainly just with cleansing of the wound but the dressings themselves don't seem to be causing him problems. 11/29/17 on evaluation today patient appears to be doing very well in regard to his lower extremity ulcer. With that being said he unfortunately prior to coming in for evaluation today did have a trip over a world where he failed hurting his back and striking his head. With that being said he has not had any altered mental status since that time and states that he's not really any more dizzy and there does not appear to be any residual effect from this. He also does not have a significant headache. With that being said this is something I did check out for him today briefly at least since he was in our office. I do think that he needs to look out for any signs or symptoms of anything worsening such as nausea, vomiting, fever, chills, confusion, or severe headaches. He has no visual disturbance at this point. 12/06/17 on evaluation today patient appears to be doing excellent in regard to his lower extremity ulcer. This is starting to really fill in which is good news as well. We do have the results of the arterial study unfortunately they did not perform the TBI portion of the examination and his ABI's were noncompressible. With that being said they did state that he had triphasic  blood flow throughout the bilateral lower extremities and there was apparently no evidence of arterial disease on the arterial ultrasound. Overall this is good news we will be able to initiate stronger compression therapy which should help to allow this wound to heal even faster as the patient still continues to have some pitting edema. 12/10/17 on evaluation today patient appears to be doing much better in regard to his lower extremity ulcer. He has been tolerating the dressing changes without complication. Fortunately the compression wrap seems to be doing excellent at this time and overall he is showing signs of good improvement. I'm happy with the overall progress that has been made over the past several weeks but especially in the past week. The patient and his wife both are extremely pleased. 12/18/17; right posterior calf ulcer. Healthy granulation. Dimension slightly smaller. Using silver collagen under compression 12/24/17 on evaluation today patient presents for follow-up concerning his ongoing right lower extremity ulcer. He has been tolerating the dressing changes without complication. Fortunately he seems to be doing excellent at this point. There does not appear to be any evidence of infection. 12/31/17 on evaluation today patient appears to be doing excellent in regard to his right lower extremity ulcer. He has been tolerating the dressing changes without complication. The wrap appears to have been very beneficial for him up to this point. Wheatley, Shin M. ([id number]) With that being said he shows no signs of infection at this time and overall I'm very pleased with how things seem to be progressing. 01/07/18 on evaluation today patient actually appears to be doing excellent in regard to his ulcer. In fact there was a lot of dried dressing material noted covering the wound during evaluation today. Once I begin to remove this utilizing a curette I was noted that the patient actually had  significant epithelialization underlying this event was just a very small region that appears to still be open otherwise  the remainder appears to be closed. Obviously this is excellent news and I think he is progressing very nicely. Electronic Signature(s) Signed: 01/07/2018 9:24:47 PM By: Worthy Keeler PA-C Entered By: Worthy Keeler on 01/07/2018 20:36:41 [name], [name] ([id number]) -------------------------------------------------------------------------------- Physical Exam Details Patient Name: Eduardo [name], [name] Date of Service: 01/07/2018 1:45 PM Medical Record Number: [id number] Patient Account Number: [id number] Date of Birth/Sex: 11-Aug-1930 (84 y.o. M) Treating RN: Ahmed Prima Primary Care Provider: [name] Other Clinician: Referring Provider: [name] Treating Provider/Extender: Melburn Hake, HOYT Weeks in Treatment: 8 Constitutional Well-nourished and well-hydrated in no acute distress. Respiratory normal breathing without difficulty. clear to auscultation bilaterally. Cardiovascular regular rate and rhythm with normal S1, S2. Psychiatric this patient is able to make decisions and demonstrates good insight into disease process. Alert and Oriented x 3. pleasant and cooperative. Notes Patient's wound bed once all the dressing material and eschar was removed was very small fact just really couple pinpoint areas remaining. My suggestion will be to continue with dressing changes although I think we can discontinue the wrap at this point his swelling seems to be under much better control compared to when I first started seeing partly in response to the fluid pills he is now taking. Electronic Signature(s) Signed: 01/07/2018 9:24:47 PM By: Worthy Keeler PA-C Entered By: Worthy Keeler on 01/07/2018 20:37:15 [name], [name] ([id number]) -------------------------------------------------------------------------------- Physician Orders  Details Patient Name: Eduardo [name]. Date of Service: 01/07/2018 1:45 PM Medical Record Number: [id number] Patient Account Number: [id number] Date of Birth/Sex: 07-Dec-1930 (84 y.o. M) Treating RN: Cornell Barman Primary Care Provider: [name] Other Clinician: Referring Provider: [name] Treating Provider/Extender: Melburn Hake, HOYT Weeks in Treatment: 8 Verbal / Phone Orders: No Diagnosis Coding ICD-10 Coding Code Description [phone] Non-pressure chronic ulcer of other part of right lower leg with fat layer exposed I48.0 Paroxysmal atrial fibrillation Z79.01 Long term (current) use of anticoagulants J44.9 Chronic obstructive pulmonary disease, unspecified I25.10 Atherosclerotic heart disease of native coronary artery without angina pectoris Wound Cleansing Wound #1 Right Lower Leg o Cleanse wound with mild soap and water Anesthetic (add to Medication List) Wound #1 Right Lower Leg o Topical Lidocaine 4% cream applied to wound bed prior to debridement (In Clinic Only). Skin Barriers/Peri-Wound Care Wound #1 Right Lower Leg o Moisturizing lotion Primary Wound Dressing Wound #1 Right Lower Leg o Silver Collagen Secondary Dressing Wound #1 Right Lower Leg o Boardered Foam Dressing Dressing Change Frequency Wound #1 Right Lower Leg o Three times weekly Follow-up Appointments Wound #1 Right Lower Leg o Return Appointment in 1 week. Additional Orders / Instructions Wound #1 Right Lower Leg o Increase protein intake. SHEEHAN, STACEY ([id number]) Electronic Signature(s) Signed: 01/07/2018 9:24:47 PM By: Worthy Keeler PA-C Signed: 01/10/2018 6:17:48 PM By: Gretta Cool, BSN, RN, CWS, Kim RN, BSN Entered By: Gretta Cool, BSN, RN, CWS, Kim on 01/07/2018 14:48:32 Eduardo [name], Eduardo [name] ([id number]) -------------------------------------------------------------------------------- Problem List Details Patient Name: VINCENTE, ASBRIDGE. Date of Service: 01/07/2018  1:45 PM Medical Record Number: [id number] Patient Account Number: [id number] Date of Birth/Sex: June 05, 1931 (84 y.o. M) Treating RN: Ahmed Prima Primary Care Provider: [name] Other Clinician: Referring Provider: [name] Treating Provider/Extender: Melburn Hake, HOYT Weeks in Treatment: 8 Active Problems ICD-10 Evaluated Encounter Code Description Active Date Today Diagnosis [phone] Non-pressure chronic ulcer of other part of right lower leg 11/08/2017 Yes Yes with fat layer exposed Status Complications Interventions Medical Improving wound bed is doing nicely on the posterior calf. Healthy continue silver  Decision base collagen under 3 Making : layer compression I48.0 Paroxysmal atrial fibrillation 11/08/2017 No Yes Z79.01 Long term (current) use of anticoagulants 11/08/2017 No Yes J44.9 Chronic obstructive pulmonary disease, unspecified 11/08/2017 No Yes I25.10 Atherosclerotic heart disease of native coronary artery 11/08/2017 No Yes without angina pectoris Inactive Problems Resolved Problems Electronic Signature(s) Signed: 01/07/2018 9:24:47 PM By: Worthy Keeler PA-C Entered By: Worthy Keeler on 01/07/2018 13:50:50 [name], [name] ([id number]) -------------------------------------------------------------------------------- Progress Note Details Patient Name: Eduardo [name]. Date of Service: 01/07/2018 1:45 PM Medical Record Number: [id number] Patient Account Number: [id number] Date of Birth/Sex: [birth year] (84 y.o. M) Treating RN: Ahmed Prima Primary Care Provider: [name] Other Clinician: Referring Provider: [name] Treating Provider/Extender: Melburn Hake, HOYT Weeks in Treatment: 8 Subjective Chief Complaint Information obtained from Patient Right lower leg ulcer due to trauma History of Present Illness (HPI) 11/08/17 on evaluation today patient presents initially concerning a right dramatic lower extremity ulcer/skin tear  which occurred three weeks ago. He states that he initially was seen in urgent care where the area was sutured closed. The sutures stayed in for two weeks he tells me. Subsequently he was seen at Dr. Alla German office where the sutures were removed and Steri-Strips were placed over the next five days. Subsequently these were also removed and unfortunately at this time it was noted that the patient had a dehisced larger wound that required further attention. The patient and his wife actually requested referral to the wound care center in Dr. [name] made the referral to [place]. Subsequently the patient does have a history of atrial fibrillation, cardiovascular disease, COPD, stroke, and is on long-term Eliquis. There's no history of diabetes that he states. Subsequently he has no other major medical problems currently. No fevers, chills, nausea, or vomiting noted at this time. 11/14/17-He is seen in follow-up evaluation for right posterior lower extremity ulcer. There is improvement with granulation tissue present; debridement. We will continue with same treatment plan he'll follow-up next week. The vascular office did call to establish an appointment, but he forgot that we had referred him and did not make an appointment. He was encouraged to contact the office to schedule an appointment for evaluation. 11/22/17 on evaluation today patient actually appears to be doing well in regard to his ulcer. This has cleaned up very nicely in my pinion I do believe the Iodoflex has been of benefit for him. He actually does have his arterial study which is scheduled for 11/27/17. In general he's having no significant discomfort mainly just with cleansing of the wound but the dressings themselves don't seem to be causing him problems. 11/29/17 on evaluation today patient appears to be doing very well in regard to his lower extremity ulcer. With that being said he unfortunately prior to coming in for evaluation today did have  a trip over a world where he failed hurting his back and striking his head. With that being said he has not had any altered mental status since that time and states that he's not really any more dizzy and there does not appear to be any residual effect from this. He also does not have a significant headache. With that being said this is something I did check out for him today briefly at least since he was in our office. I do think that he needs to look out for any signs or symptoms of anything worsening such as nausea, vomiting, fever, chills, confusion, or severe headaches. He has no visual disturbance at this  point. 12/06/17 on evaluation today patient appears to be doing excellent in regard to his lower extremity ulcer. This is starting to really fill in which is good news as well. We do have the results of the arterial study unfortunately they did not perform the TBI portion of the examination and his ABI's were noncompressible. With that being said they did state that he had triphasic blood flow throughout the bilateral lower extremities and there was apparently no evidence of arterial disease on the arterial ultrasound. Overall this is good news we will be able to initiate stronger compression therapy which should help to allow this wound to heal even faster as the patient still continues to have some pitting edema. 12/10/17 on evaluation today patient appears to be doing much better in regard to his lower extremity ulcer. He has been tolerating the dressing changes without complication. Fortunately the compression wrap seems to be doing excellent at this time and overall he is showing signs of good improvement. I'm happy with the overall progress that has been made over the past several weeks but especially in the past week. The patient and his wife both are extremely pleased. 12/18/17; right posterior calf ulcer. Healthy granulation. Dimension slightly smaller. Using silver collagen under  compression 12/24/17 on evaluation today patient presents for follow-up concerning his ongoing right lower extremity ulcer. He has been Torr, Rylyn M. ([id number]) tolerating the dressing changes without complication. Fortunately he seems to be doing excellent at this point. There does not appear to be any evidence of infection. 12/31/17 on evaluation today patient appears to be doing excellent in regard to his right lower extremity ulcer. He has been tolerating the dressing changes without complication. The wrap appears to have been very beneficial for him up to this point. With that being said he shows no signs of infection at this time and overall I'm very pleased with how things seem to be progressing. 01/07/18 on evaluation today patient actually appears to be doing excellent in regard to his ulcer. In fact there was a lot of dried dressing material noted covering the wound during evaluation today. Once I begin to remove this utilizing a curette I was noted that the patient actually had significant epithelialization underlying this event was just a very small region that appears to still be open otherwise the remainder appears to be closed. Obviously this is excellent news and I think he is progressing very nicely. Patient History Information obtained from Patient. Family History Cancer - Mother,Child, Heart Disease - Father, Lung Disease - Child, Stroke - Maternal Grandparents, No family history of Diabetes, Hereditary Spherocytosis, Hypertension, Kidney Disease, Seizures, Thyroid Problems, Tuberculosis. Social History Former smoker - 62 years, Marital Status - Married, Alcohol Use - Never, Caffeine Use - Daily. Review of Systems (ROS) Constitutional Symptoms (General Health) Denies complaints or symptoms of Fever, Chills. Respiratory The patient has no complaints or symptoms. Cardiovascular Complains or has symptoms of LE edema. Psychiatric The patient has no complaints or  symptoms. Objective Constitutional Well-nourished and well-hydrated in no acute distress. Vitals Time Taken: 2:12 PM, Height: 62 in, Weight: 153.7 lbs, BMI: 28.1, Temperature: 97.8 F, Pulse: 76 bpm, Respiratory Rate: 18 breaths/min, Blood Pressure: 133/46 mmHg. Respiratory normal breathing without difficulty. clear to auscultation bilaterally. Cardiovascular regular rate and rhythm with normal S1, S2. [name], [name] ([id number]) Psychiatric this patient is able to make decisions and demonstrates good insight into disease process. Alert and Oriented x 3. pleasant and cooperative. General Notes: Patient's wound bed once  all the dressing material and eschar was removed was very small fact just really couple pinpoint areas remaining. My suggestion will be to continue with dressing changes although I think we can discontinue the wrap at this point his swelling seems to be under much better control compared to when I first started seeing partly in response to the fluid pills he is now taking. Integumentary (Hair, Skin) Wound #1 status is Open. Original cause of wound was Trauma. The wound is located on the Right Lower Leg. The wound measures 0.3cm length x 0.3cm width x 0.1cm depth; 0.071cm^2 area and 0.007cm^3 volume. There is Fat Layer (Subcutaneous Tissue) Exposed exposed. There is no tunneling or undermining noted. There is a large amount of serosanguineous drainage noted. The wound margin is thickened. There is no granulation within the wound bed. There is a large (67-100%) amount of necrotic tissue within the wound bed including Eschar. The periwound skin appearance exhibited: Callus, Induration, Erythema. The periwound skin appearance did not exhibit: Crepitus, Excoriation, Rash, Scarring, Dry/Scaly, Maceration, Atrophie Blanche, Cyanosis, Ecchymosis, Hemosiderin Staining, Mottled, Pallor, Rubor. The surrounding wound skin color is noted with erythema which is circumferential.  Periwound temperature was noted as No Abnormality. The periwound has tenderness on palpation. Assessment Active Problems ICD-10 Non-pressure chronic ulcer of other part of right lower leg with fat layer exposed Paroxysmal atrial fibrillation Long term (current) use of anticoagulants Chronic obstructive pulmonary disease, unspecified Atherosclerotic heart disease of native coronary artery without angina pectoris Procedures Wound #1 Pre-procedure diagnosis of Wound #1 is a Trauma, Other located on the Right Lower Leg . There was a Selective/Open Wound Non-Viable Tissue Debridement with a total area of 0.09 sq cm performed by STONE III, HOYT E., PA-C. With the following instrument(s): Curette to remove Non-Viable tissue/material. Material removed includes Eschar after achieving pain control using Other (lidocaine 4%). No specimens were taken. A time out was conducted at 14:37, prior to the start of the procedure. There was no bleeding. The procedure was tolerated well. Patient s Level of Consciousness post procedure was recorded as Awake and Alert. Post Debridement Measurements: 0.2cm length x 0.2cm width x 0.1cm depth; 0.003cm^3 volume. Character of Wound/Ulcer Post Debridement is improved. Post procedure Diagnosis Wound #1: Same as Pre-Procedure [name], [name] ([id number]) Plan Wound Cleansing: Wound #1 Right Lower Leg: Cleanse wound with mild soap and water Anesthetic (add to Medication List): Wound #1 Right Lower Leg: Topical Lidocaine 4% cream applied to wound bed prior to debridement (In Clinic Only). Skin Barriers/Peri-Wound Care: Wound #1 Right Lower Leg: Moisturizing lotion Primary Wound Dressing: Wound #1 Right Lower Leg: Silver Collagen Secondary Dressing: Wound #1 Right Lower Leg: Boardered Foam Dressing Dressing Change Frequency: Wound #1 Right Lower Leg: Three times weekly Follow-up Appointments: Wound #1 Right Lower Leg: Return Appointment in 1  week. Additional Orders / Instructions: Wound #1 Right Lower Leg: Increase protein intake. Medical Decision Making Non-pressure chronic ulcer of other part of right lower leg with fat layer exposed 11/08/2017 Status: Improving Complications: wound bed is doing nicely on the posterior calf. Healthy base Interventions: continue silver collagen under 3 layer compression We will initiate the above wound care orders for the next week. I think it's very possible he may be completely healed by that time. We will subsequently see him back for reevaluation as needed in future if anything changes but again we will see him one more week to ensure everything does completely close before discharge. Please see above for specific wound care orders. We  will see patient for re-evaluation in 1 week(s) here in the clinic. If anything worsens or changes patient will contact our office for additional recommendations. Electronic Signature(s) Signed: 01/07/2018 9:24:47 PM By: Worthy Keeler PA-C Entered By: Worthy Keeler on 01/07/2018 20:37:33 [name], [name] ([id number]) -------------------------------------------------------------------------------- ROS/PFSH Details Patient Name: Eduardo [name]. Date of Service: 01/07/2018 1:45 PM Medical Record Number: [id number] Patient Account Number: [id number] Date of Birth/Sex: [birth year] (84 y.o. M) Treating RN: Ahmed Prima Primary Care Provider: [name] Other Clinician: Referring Provider: [name] Treating Provider/Extender: Melburn Hake, HOYT Weeks in Treatment: 8 Information Obtained From Patient Wound History Do you currently have one or more open woundso Yes How many open wounds do you currently haveo 1 Approximately how long have you had your woundso 3.5 weeks How have you been treating your wound(s) until nowo wet to dry Has your wound(s) ever healed and then re-openedo No Have you had any lab work done in the past montho  Yes Have you tested positive for an antibiotic resistant organism (MRSA, VRE)o No Have you tested positive for osteomyelitis (bone infection)o No Have you had any tests for circulation on your legso No Constitutional Symptoms (General Health) Complaints and Symptoms: Negative for: Fever; Chills Cardiovascular Complaints and Symptoms: Positive for: LE edema Medical History: Positive for: Arrhythmia - A-fib; Congestive Heart Failure; Coronary Artery Disease; Myocardial Infarction - 2013 Negative for: Angina; Deep Vein Thrombosis; Hypertension; Hypotension; Peripheral Arterial Disease; Peripheral Venous Disease; Phlebitis; Vasculitis Eyes Medical History: Positive for: Glaucoma - replaced Negative for: Cataracts; Optic Neuritis Ear/Nose/Mouth/Throat Medical History: Negative for: Chronic sinus problems/congestion; Middle ear problems Hematologic/Lymphatic Medical History: Negative for: Anemia; Hemophilia; Human Immunodeficiency Virus; Lymphedema; Sickle Cell Disease Respiratory Complaints and Symptoms: No Complaints or Symptoms Medical History: LONEY, DOMINGO. ([id number]) Positive for: Chronic Obstructive Pulmonary Disease (COPD) Negative for: Aspiration; Asthma; Pneumothorax; Sleep Apnea; Tuberculosis Gastrointestinal Medical History: Negative for: Cirrhosis ; Colitis; Crohnos; Hepatitis A; Hepatitis B; Hepatitis C Endocrine Medical History: Negative for: Type I Diabetes; Type II Diabetes Genitourinary Medical History: Negative for: End Stage Renal Disease Immunological Medical History: Negative for: Lupus Erythematosus; Raynaudos; Scleroderma Integumentary (Skin) Medical History: Negative for: History of Burn; History of pressure wounds Musculoskeletal Medical History: Negative for: Gout; Rheumatoid Arthritis; Osteoarthritis; Osteomyelitis Neurologic Medical History: Positive for: Neuropathy - Feet and legs Negative for: Dementia; Quadriplegia; Paraplegia;  Seizure Disorder Oncologic Medical History: Negative for: Received Chemotherapy; Received Radiation Psychiatric Complaints and Symptoms: No Complaints or Symptoms Medical History: Negative for: Anorexia/bulimia; Confinement Anxiety HBO Extended History Items Eyes: Glaucoma Immunizations Pneumococcal Vaccine: Received Pneumococcal Vaccination: Yes RUBY, DILONE ([id number]) Implantable Devices Family and Social History Cancer: Yes - Mother,Child; Diabetes: No; Heart Disease: Yes - Father; Hereditary Spherocytosis: No; Hypertension: No; Kidney Disease: No; Lung Disease: Yes - Child; Seizures: No; Stroke: Yes - Maternal Grandparents; Thyroid Problems: No; Tuberculosis: No; Former smoker - 68 years; Marital Status - Married; Alcohol Use: Never; Caffeine Use: Daily; Advanced Directives: Yes (Not Provided); Patient does not want information on Advanced Directives; Living Will: Yes (Copy provided); Medical Power of Attorney: Yes (Not Provided) Physician Affirmation I have reviewed and agree with the above information. Electronic Signature(s) Signed: 01/07/2018 9:24:47 PM By: Worthy Keeler PA-C Signed: 01/13/2018 4:59:33 PM By: Alric Quan Entered By: Worthy Keeler on 01/07/2018 20:37:03 Wolven, Chinmay M. ([id number]) -------------------------------------------------------------------------------- [place] Details Patient Name: Eduardo [name]. Date of Service: 01/07/2018 Medical Record Number: [id number] Patient Account Number: [id number] Date of Birth/Sex: 08-15-1930 (84 y.o. M) Treating RN: Carolyne Fiscal,  Debi Primary Care Provider: [name] Other Clinician: Referring Provider: [name] Treating Provider/Extender: Melburn Hake, HOYT Weeks in Treatment: 8 Diagnosis Coding ICD-10 Codes Code Description [phone] Non-pressure chronic ulcer of other part of right lower leg with fat layer exposed I48.0 Paroxysmal atrial fibrillation Z79.01 Long term (current)  use of anticoagulants J44.9 Chronic obstructive pulmonary disease, unspecified I25.10 Atherosclerotic heart disease of native coronary artery without angina pectoris Facility Procedures CPT4 Code Description: [id number] - DEBRIDE WOUND 1ST 20 SQ CM OR < ICD-10 Diagnosis Description L97.812 Non-pressure chronic ulcer of other part of right lower leg with Modifier: fat layer expo Quantity: 1 sed Physician Procedures CPT4 Code Description: [id number] 08144 - WC PHYS DEBR WO ANESTH 20 SQ CM ICD-10 Diagnosis Description Y18.563 Non-pressure chronic ulcer of other part of right lower leg with Modifier: fat layer expo Quantity: 1 sed Electronic Signature(s) Signed: 01/07/2018 9:24:47 PM By: Worthy Keeler PA-C Entered By: Worthy Keeler on 01/07/2018 20:37:49

## 2018-01-24 NOTE — Progress Notes (Signed)
Eduardo [name] ([id number]) Visit Report for 01/14/2018 Chief Complaint Document Details Patient Name: Eduardo [name], Eduardo [name]. Date of Service: 01/14/2018 1:15 PM Medical Record Number: [id number] Patient Account Number: [id number] Date of Birth/Sex: [birth year] (84 y.o. M) Treating RN: Eduardo [name] Primary Care Provider: Viviana [name] Other Clinician: Referring Provider: Viviana [name] Treating Provider/Extender: Eduardo [name] in Treatment: 9 Information Obtained from: Patient Chief Complaint Right lower leg ulcer due to trauma Electronic Signature(s) Signed: 01/14/2018 2:23:33 PM By: Eduardo [name] Entered By: Eduardo [name] on 01/14/2018 14:23:33 Seibold, Gracyn M. ([id number]) -------------------------------------------------------------------------------- HPI Details Patient Name: Eduardo [name]. Date of Service: 01/14/2018 1:15 PM Medical Record Number: [id number] Patient Account Number: [id number] Date of Birth/Sex: 1930-07-27 (84 y.o. M) Treating RN: Eduardo [name] Primary Care Provider: Viviana [name] Other Clinician: Referring Provider: Viviana [name] Treating Provider/Extender: Eduardo [name] in Treatment: 9 History of Present Illness HPI Description: 11/08/17 on evaluation today patient presents initially concerning a right dramatic lower extremity ulcer/skin tear which occurred three weeks ago. He states that he initially was seen in urgent care where the area was sutured closed. The sutures stayed in for two weeks he tells me. Subsequently he was seen at Dr. Alla German office where the sutures were removed and Steri-Strips were placed over the next five days. Subsequently these were also removed and unfortunately at this time it was noted that the patient had a dehisced larger wound that required further attention. The patient and his wife actually requested referral to the wound care center in [place]. [name] made the referral to [place]. Subsequently the patient  does have a history of atrial fibrillation, cardiovascular disease, COPD, stroke, and is on long-term Eliquis. There's no history of diabetes that he states. Subsequently he has no other major medical problems currently. No fevers, chills, nausea, or vomiting noted at this time. 11/14/17-He is seen in follow-up evaluation for right posterior lower extremity ulcer. There is improvement with granulation tissue present; debridement. We will continue with same treatment plan he'll follow-up next week. The vascular office did call to establish an appointment, but he forgot that we had referred him and did not make an appointment. He was encouraged to contact the office to schedule an appointment for evaluation. 11/22/17 on evaluation today patient actually appears to be doing well in regard to his ulcer. This has cleaned up very nicely in my pinion I do believe the Iodoflex has been of benefit for him. He actually does have his arterial study which is scheduled for 11/27/17. In general he's having no significant discomfort mainly just with cleansing of the wound but the dressings themselves don't seem to be causing him problems. 11/29/17 on evaluation today patient appears to be doing very well in regard to his lower extremity ulcer. With that being said he unfortunately prior to coming in for evaluation today did have a trip over a world where he failed hurting his back and striking his head. With that being said he has not had any altered mental status since that time and states that he's not really any more dizzy and there does not appear to be any residual effect from this. He also does not have a significant headache. With that being said this is something I did check out for him today briefly at least since he was in our office. I do think that he needs to look out for any signs or symptoms of anything worsening such as nausea, vomiting, fever, chills, confusion, or severe headaches. He has no visual  disturbance at this point. 12/06/17 on evaluation today patient appears to be doing excellent in regard to his lower extremity ulcer. This is starting to really fill in which is good news as well. We do have the results of the arterial study unfortunately they did not perform the TBI portion of the examination and his ABI's were noncompressible. With that being said they did state that he had triphasic blood flow throughout the bilateral lower extremities and there was apparently no evidence of arterial disease on the arterial ultrasound. Overall this is good news we will be able to initiate stronger compression therapy which should help to allow this wound to heal even faster as the patient still continues to have some pitting edema. 12/10/17 on evaluation today patient appears to be doing much better in regard to his lower extremity ulcer. He has been tolerating the dressing changes without complication. Fortunately the compression wrap seems to be doing excellent at this time and overall he is showing signs of good improvement. I'm happy with the overall progress that has been made over the past several weeks but especially in the past week. The patient and his wife both are extremely pleased. 12/18/17; right posterior calf ulcer. Healthy granulation. Dimension slightly smaller. Using silver collagen under compression 12/24/17 on evaluation today patient presents for follow-up concerning his ongoing right lower extremity ulcer. He has been tolerating the dressing changes without complication. Fortunately he seems to be doing excellent at this point. There does not appear to be any evidence of infection. 12/31/17 on evaluation today patient appears to be doing excellent in regard to his right lower extremity ulcer. He has been tolerating the dressing changes without complication. The wrap appears to have been very beneficial for him up to this point. [name], [name] ([id number]) With that being  said he shows no signs of infection at this time and overall I'm very pleased with how things seem to be progressing. 01/07/18 on evaluation today patient actually appears to be doing excellent in regard to his ulcer. In fact there was a lot of dried dressing material noted covering the wound during evaluation today. Once I begin to remove this utilizing a curette I was noted that the patient actually had significant epithelialization underlying this event was just a very small region that appears to still be open otherwise the remainder appears to be closed. Obviously this is excellent news and I think he is progressing very nicely. 01/14/18-he is seen in follow-up evaluation. There is dried dressing which was removed to reveal an epithelialized wound. He will be discharged from services today Electronic Signature(s) Signed: 01/14/2018 2:25:12 PM By: Eduardo [name] Entered By: Eduardo [name] on 01/14/2018 14:25:12 [name], Eduardo Jerilynn Mages ([id number]) -------------------------------------------------------------------------------- Physician Orders Details Patient Name: Eduardo [name]. Date of Service: 01/14/2018 1:15 PM Medical Record Number: [id number] Patient Account Number: [id number] Date of Birth/Sex: 07-03-1930 (84 y.o. M) Treating RN: Eduardo [name] Primary Care Provider: Viviana [name] Other Clinician: Referring Provider: Viviana [name] Treating Provider/Extender: Eduardo [name] in Treatment: 9 Verbal / Phone Orders: Yes Clinician: Carolyne Fiscal, Debi Read Back and Verified: Yes Diagnosis Coding Discharge From [facility] o Discharge from [place] - Please call our office if you have any questions or concerns. Electronic Signature(s) Signed: 01/14/2018 3:45:39 PM By: Eduardo [name] Signed: 01/14/2018 3:52:40 PM By: Alric Quan Entered By: Alric Quan on 01/14/2018 14:04:11 Eduardo [name], [name]  ([id number]) -------------------------------------------------------------------------------- Problem List Details Patient Name: Eduardo [name], Eduardo [name]. Date of Service: 01/14/2018 1:15 PM Medical Record  Number: [id number] Patient Account Number: [id number] Date of Birth/Sex: 08-11-1930 (84 y.o. M) Treating RN: Eduardo [name] Primary Care Provider: Viviana [name] Other Clinician: Referring Provider: Viviana [name] Treating Provider/Extender: Eduardo [name] in Treatment: 9 Active Problems ICD-10 Evaluated Encounter Code Description Active Date Today Diagnosis [phone] Non-pressure chronic ulcer of other part of right lower leg 11/08/2017 Yes Yes with fat layer exposed Status Complications Interventions Medical Improving wound bed is doing nicely on the posterior calf. Healthy continue silver Decision base collagen under 3 Making : layer compression I48.0 Paroxysmal atrial fibrillation 11/08/2017 No Yes Z79.01 Long term (current) use of anticoagulants 11/08/2017 No Yes J44.9 Chronic obstructive pulmonary disease, unspecified 11/08/2017 No Yes I25.10 Atherosclerotic heart disease of native coronary artery 11/08/2017 No Yes without angina pectoris Inactive Problems Resolved Problems Electronic Signature(s) Signed: 01/14/2018 2:05:45 PM By: Eduardo [name] Entered By: Eduardo [name] on 01/14/2018 14:05:45 [name], [name] ([id number]) -------------------------------------------------------------------------------- Progress Note Details Patient Name: Eduardo [name]. Date of Service: 01/14/2018 1:15 PM Medical Record Number: [id number] Patient Account Number: [id number] Date of Birth/Sex: 05/20/1931 (84 y.o. M) Treating RN: Eduardo [name] Primary Care Provider: Viviana [name] Other Clinician: Referring Provider: Viviana [name] Treating Provider/Extender: Eduardo [name] in Treatment: 9 Subjective Chief Complaint Information obtained from Patient Right lower leg ulcer due to  trauma History of Present Illness (HPI) 11/08/17 on evaluation today patient presents initially concerning a right dramatic lower extremity ulcer/skin tear which occurred three weeks ago. He states that he initially was seen in urgent care where the area was sutured closed. The sutures stayed in for two weeks he tells me. Subsequently he was seen at Dr. Alla German office where the sutures were removed and Steri-Strips were placed over the next five days. Subsequently these were also removed and unfortunately at this time it was noted that the patient had a dehisced larger wound that required further attention. The patient and his wife actually requested referral to the wound care center in [place]. [name] made the referral to [place]. Subsequently the patient does have a history of atrial fibrillation, cardiovascular disease, COPD, stroke, and is on long-term Eliquis. There's no history of diabetes that he states. Subsequently he has no other major medical problems currently. No fevers, chills, nausea, or vomiting noted at this time. 11/14/17-He is seen in follow-up evaluation for right posterior lower extremity ulcer. There is improvement with granulation tissue present; debridement. We will continue with same treatment plan he'll follow-up next week. The vascular office did call to establish an appointment, but he forgot that we had referred him and did not make an appointment. He was encouraged to contact the office to schedule an appointment for evaluation. 11/22/17 on evaluation today patient actually appears to be doing well in regard to his ulcer. This has cleaned up very nicely in my pinion I do believe the Iodoflex has been of benefit for him. He actually does have his arterial study which is scheduled for 11/27/17. In general he's having no significant discomfort mainly just with cleansing of the wound but the dressings themselves don't seem to be causing him problems. 11/29/17 on evaluation today patient  appears to be doing very well in regard to his lower extremity ulcer. With that being said he unfortunately prior to coming in for evaluation today did have a trip over a world where he failed hurting his back and striking his head. With that being said he has not had any altered mental status since that time and states that he's not really any more  dizzy and there does not appear to be any residual effect from this. He also does not have a significant headache. With that being said this is something I did check out for him today briefly at least since he was in our office. I do think that he needs to look out for any signs or symptoms of anything worsening such as nausea, vomiting, fever, chills, confusion, or severe headaches. He has no visual disturbance at this point. 12/06/17 on evaluation today patient appears to be doing excellent in regard to his lower extremity ulcer. This is starting to really fill in which is good news as well. We do have the results of the arterial study unfortunately they did not perform the TBI portion of the examination and his ABI's were noncompressible. With that being said they did state that he had triphasic blood flow throughout the bilateral lower extremities and there was apparently no evidence of arterial disease on the arterial ultrasound. Overall this is good news we will be able to initiate stronger compression therapy which should help to allow this wound to heal even faster as the patient still continues to have some pitting edema. 12/10/17 on evaluation today patient appears to be doing much better in regard to his lower extremity ulcer. He has been tolerating the dressing changes without complication. Fortunately the compression wrap seems to be doing excellent at this time and overall he is showing signs of good improvement. I'm happy with the overall progress that has been made over the past several weeks but especially in the past week. The patient and  his wife both are extremely pleased. 12/18/17; right posterior calf ulcer. Healthy granulation. Dimension slightly smaller. Using silver collagen under compression 12/24/17 on evaluation today patient presents for follow-up concerning his ongoing right lower extremity ulcer. He has been [name], [name] ([id number]) tolerating the dressing changes without complication. Fortunately he seems to be doing excellent at this point. There does not appear to be any evidence of infection. 12/31/17 on evaluation today patient appears to be doing excellent in regard to his right lower extremity ulcer. He has been tolerating the dressing changes without complication. The wrap appears to have been very beneficial for him up to this point. With that being said he shows no signs of infection at this time and overall I'm very pleased with how things seem to be progressing. 01/07/18 on evaluation today patient actually appears to be doing excellent in regard to his ulcer. In fact there was a lot of dried dressing material noted covering the wound during evaluation today. Once I begin to remove this utilizing a curette I was noted that the patient actually had significant epithelialization underlying this event was just a very small region that appears to still be open otherwise the remainder appears to be closed. Obviously this is excellent news and I think he is progressing very nicely. 01/14/18-he is seen in follow-up evaluation. There is dried dressing which was removed to reveal an epithelialized wound. He will be discharged from services today Objective Constitutional Vitals Time Taken: 1:28 PM, Height: 62 in, Weight: 153.7 lbs, BMI: 28.1, Temperature: 98.1 F, Pulse: 77 bpm, Respiratory Rate: 18 breaths/min, Blood Pressure: 137/58 mmHg. Integumentary (Hair, Skin) Wound #1 status is Healed - Epithelialized. Original cause of wound was Trauma. The wound is located on the Right Lower Leg. The wound measures  0cm length x 0cm width x 0cm depth; 0cm^2 area and 0cm^3 volume. There is no tunneling or undermining noted. There is  a none present amount of drainage noted. There is no granulation within the wound bed. There is no necrotic tissue within the wound bed. Periwound temperature was noted as No Abnormality. Assessment Active Problems ICD-10 Non-pressure chronic ulcer of other part of right lower leg with fat layer exposed Paroxysmal atrial fibrillation Long term (current) use of anticoagulants Chronic obstructive pulmonary disease, unspecified Atherosclerotic heart disease of native coronary artery without angina pectoris Plan Eduardo [name], Eduardo [name] ([id number]) [facility]: Discharge from [place] - Please call our office if you have any questions or concerns. Medical Decision Making Non-pressure chronic ulcer of other part of right lower leg with fat layer exposed 11/08/2017 Status: Improving Complications: wound bed is doing nicely on the posterior calf. Healthy base Interventions: continue silver collagen under 3 layer compression Electronic Signature(s) Signed: 01/14/2018 2:25:25 PM By: Eduardo [name] Entered By: Eduardo [name] on 01/14/2018 14:25:24 [name], [name] ([id number]) -------------------------------------------------------------------------------- [place] Details Patient Name: Eduardo [name]. Date of Service: 01/14/2018 Medical Record Number: [id number] Patient Account Number: [id number] Date of Birth/Sex: 02-16-1931 (84 y.o. M) Treating RN: Eduardo [name] Primary Care Provider: Viviana [name] Other Clinician: Referring Provider: Viviana [name] Treating Provider/Extender: Eduardo [name] in Treatment: 9 Diagnosis Coding ICD-10 Codes Code Description [phone] Non-pressure chronic ulcer of other part of right lower leg with fat layer exposed I48.0 Paroxysmal atrial fibrillation Z79.01 Long term (current) use of anticoagulants J44.9  Chronic obstructive pulmonary disease, unspecified I25.10 Atherosclerotic heart disease of native coronary artery without angina pectoris Facility Procedures CPT4 Code: [id number] Description: [phone] - WOUND CARE VISIT-LEV 2 EST PT Modifier: Quantity: 1 Physician Procedures CPT4 Code Description: [id number] 75643 - WC PHYS LEVEL 2 - EST PT ICD-10 Diagnosis Description L97.812 Non-pressure chronic ulcer of other part of right lower leg w Modifier: ith fat layer expo Quantity: 1 sed Electronic Signature(s) Signed: 01/14/2018 2:44:34 PM By: Alric Quan Signed: 01/14/2018 3:45:39 PM By: Eduardo [name] Previous Signature: 01/14/2018 2:25:42 PM Version By: Eduardo [name] Entered By: Alric Quan on 01/14/2018 14:44:34

## 2018-01-27 NOTE — Progress Notes (Signed)
[name], Eduardo ([id number]) Visit Report for 01/14/2018 Arrival Information Details Patient Name: Eduardo [name]. Date of Service: 01/14/2018 1:15 PM Medical Record Number: [id number] Patient Account Number: [id number] Date of Birth/Sex: Nov 06, 1930 (84 y.o. M) Treating RN: [name] Primary Care Romonia Yanik: [name] Other Clinician: Referring Kamari Bilek: [name] Treating Mayli Covington/Extender: Cathie Olden in Treatment: 9 Visit Information History Since Last Visit Added or deleted any medications: No Patient Arrived: Walker Any new allergies or adverse reactions: No Arrival Time: 13:27 Had a fall or experienced change in No Accompanied By: spouse activities of daily living that may affect Transfer Assistance: None risk of falls: Patient Identification Verified: Yes Signs or symptoms of abuse/neglect since last visito No Secondary Verification Process Yes Hospitalized since last visit: No Completed: Implantable device outside of the clinic excluding No Patient Requires Transmission-Based No cellular tissue based products placed in the center Precautions: since last visit: Patient Has Alerts: Yes Has Dressing in Place as Prescribed: Yes Patient Alerts: Patient on Blood Pain Present Now: No Thinner Eliquis 11/08/17 ABI >220 Biateral Electronic Signature(s) Signed: 01/14/2018 4:05:36 PM By: [name] Entered By: [name] on 01/14/2018 13:28:15 Demers, Ruven M. ([id number]) -------------------------------------------------------------------------------- Clinic Level of Care Assessment Details Patient Name: Eduardo [name]. Date of Service: 01/14/2018 1:15 PM Medical Record Number: [id number] Patient Account Number: [id number] Date of Birth/Sex: [birth year] (84 y.o. M) Treating RN: Ahmed Prima Primary Care Nyeema Want: [name] Other Clinician: Referring Harshitha Fretz: [name] Treating Kailee Essman/Extender: Cathie Olden  in Treatment: 9 Clinic Level of Care Assessment Items TOOL 4 Quantity Score X - Use when only an EandM is performed on FOLLOW-UP visit 1 0 ASSESSMENTS - Nursing Assessment / Reassessment X - Reassessment of Co-morbidities (includes updates in patient status) 1 10 X- 1 5 Reassessment of Adherence to Treatment Plan ASSESSMENTS - Wound and Skin Assessment / Reassessment X - Simple Wound Assessment / Reassessment - one wound 1 5 []  - 0 Complex Wound Assessment / Reassessment - multiple wounds []  - 0 Dermatologic / Skin Assessment (not related to wound area) ASSESSMENTS - Focused Assessment []  - Circumferential Edema Measurements - multi extremities 0 []  - 0 Nutritional Assessment / Counseling / Intervention []  - 0 Lower Extremity Assessment (monofilament, tuning fork, pulses) []  - 0 Peripheral Arterial Disease Assessment (using hand held doppler) ASSESSMENTS - Ostomy and/or Continence Assessment and Care []  - Incontinence Assessment and Management 0 []  - 0 Ostomy Care Assessment and Management (repouching, etc.) PROCESS - Coordination of Care X - Simple Patient / Family Education for ongoing care 1 15 []  - 0 Complex (extensive) Patient / Family Education for ongoing care []  - 0 Staff obtains Programmer, systems, Records, Test Results / Process Orders []  - 0 Staff telephones HHA, Nursing Homes / Clarify orders / etc []  - 0 Routine Transfer to another Facility (non-emergent condition) []  - 0 Routine Hospital Admission (non-emergent condition) []  - 0 New Admissions / Biomedical engineer / Ordering NPWT, Apligraf, etc. []  - 0 Emergency Hospital Admission (emergent condition) X- 1 10 Simple Discharge Coordination [name], [name] ([id number]) []  - 0 Complex (extensive) Discharge Coordination PROCESS - Special Needs []  - Pediatric / Minor Patient Management 0 []  - 0 Isolation Patient Management []  - 0 Hearing / Language / Visual special needs []  - 0 Assessment of Community  assistance (transportation, D/C planning, etc.) []  - 0 Additional assistance / Altered mentation []  - 0 Support Surface(s) Assessment (bed, cushion, seat, etc.) INTERVENTIONS - Wound Cleansing / Measurement X - Simple Wound Cleansing -  one wound 1 5 []  - 0 Complex Wound Cleansing - multiple wounds X- 1 5 Wound Imaging (photographs - any number of wounds) []  - 0 Wound Tracing (instead of photographs) []  - 0 Simple Wound Measurement - one wound []  - 0 Complex Wound Measurement - multiple wounds INTERVENTIONS - Wound Dressings []  - Small Wound Dressing one or multiple wounds 0 []  - 0 Medium Wound Dressing one or multiple wounds []  - 0 Large Wound Dressing one or multiple wounds []  - 0 Application of Medications - topical []  - 0 Application of Medications - injection INTERVENTIONS - Miscellaneous []  - External ear exam 0 []  - 0 Specimen Collection (cultures, biopsies, blood, body fluids, etc.) []  - 0 Specimen(s) / Culture(s) sent or taken to Lab for analysis []  - 0 Patient Transfer (multiple staff / Civil Service fast streamer / Similar devices) []  - 0 Simple Staple / Suture removal (25 or less) []  - 0 Complex Staple / Suture removal (26 or more) []  - 0 Hypo / Hyperglycemic Management (close monitor of Blood Glucose) []  - 0 Ankle / Brachial Index (ABI) - do not check if billed separately X- 1 5 Vital Signs [name], [name] ([id number]) Has the patient been seen at the hospital within the last three years: Yes Total Score: 60 Level Of Care: New/Established - Level 2 Electronic Signature(s) Signed: 01/14/2018 3:52:40 PM By: Alric Quan Entered By: Alric Quan on 01/14/2018 14:44:24 [name], [name] ([id number]) -------------------------------------------------------------------------------- Encounter Discharge Information Details Patient Name: Eduardo [name]. Date of Service: 01/14/2018 1:15 PM Medical Record Number: [id number] Patient Account Number:  [id number] Date of Birth/Sex: [birth year] (84 y.o. M) Treating RN: Ahmed Prima Primary Care Brylan Seubert: [name] Other Clinician: Referring Gracemarie Skeet: [name] Treating Jozelynn Danielson/Extender: Cathie Olden in Treatment: 9 Encounter Discharge Information Items Discharge Condition: Stable Ambulatory Status: Walker Discharge Destination: Home Transportation: Private Auto Accompanied By: wife Schedule Follow-up Appointment: No Clinical Summary of Care: Electronic Signature(s) Signed: 01/14/2018 3:52:40 PM By: Alric Quan Entered By: Alric Quan on 01/14/2018 14:04:35 Goodner, Amdrew M. ([id number]) -------------------------------------------------------------------------------- Lower Extremity Assessment Details Patient Name: Eduardo [name]. Date of Service: 01/14/2018 1:15 PM Medical Record Number: [id number] Patient Account Number: [id number] Date of Birth/Sex: 26-May-1931 (84 y.o. M) Treating RN: [name] Primary Care Clarrissa Shimkus: [name] Other Clinician: Referring Aisea Bouldin: [name] Treating Shailah Gibbins/Extender: Cathie Olden in Treatment: 9 Vascular Assessment Pulses: Dorsalis Pedis Palpable: [Left:Yes] Posterior Tibial Extremity colors, hair growth, and conditions: Extremity Color: [Left:Hyperpigmented] Hair Growth on Extremity: [Left:Yes] Temperature of Extremity: [Left:Warm] Capillary Refill: [Left:< 3 seconds] Toe Nail Assessment Left: Right: Thick: Yes Discolored: Yes Deformed: No Improper Length and Hygiene: No Electronic Signature(s) Signed: 01/14/2018 4:05:36 PM By: [name] Entered By: [name] on 01/14/2018 13:34:11 Guereca, Pepe M. ([id number]) -------------------------------------------------------------------------------- Multi Wound Chart Details Patient Name: Eduardo [name]. Date of Service: 01/14/2018 1:15 PM Medical Record Number: [id number] Patient Account Number: [id number] Date of  Birth/Sex: 1931/01/01 (84 y.o. M) Treating RN: Ahmed Prima Primary Care Eivin Mascio: [name] Other Clinician: Referring Ameenah Prosser: [name] Treating Modesta Sammons/Extender: Cathie Olden in Treatment: 9 Vital Signs Height(in): 62 Pulse(bpm): 68 Weight(lbs): 153.7 Blood Pressure(mmHg): 137/58 Body Mass Index(BMI): 28 Temperature(F): 98.1 Respiratory Rate 18 (breaths/min): Photos: [1:No Photos] [N/A:N/A] Wound Location: [1:Right Lower Leg] [N/A:N/A] Wounding Event: [1:Trauma] [N/A:N/A] Primary Etiology: [1:Trauma, Other] [N/A:N/A] Comorbid History: [1:Glaucoma, Chronic Obstructive Pulmonary Disease (COPD), Arrhythmia, Congestive Heart Failure, Coronary Artery Disease, Myocardial Infarction, Neuropathy] [N/A:N/A] Date Acquired: [1:10/15/2017] [N/A:N/A] Weeks of Treatment: [1:9] [N/A:N/A] Wound Status: [1:Healed - Epithelialized] [  N/A:N/A] Measurements L x W x D [1:0x0x0] [N/A:N/A] (cm) Area (cm) : [1:0] [N/A:N/A] Volume (cm) : [1:0] [N/A:N/A] % Reduction in Area: [1:100.00%] [N/A:N/A] % Reduction in Volume: [1:100.00%] [N/A:N/A] Classification: [1:Full Thickness Without Exposed Support Structures] [N/A:N/A] Exudate Amount: [1:None Present] [N/A:N/A] Granulation Amount: [1:None Present (0%)] [N/A:N/A] Necrotic Amount: [1:None Present (0%)] [N/A:N/A] Exposed Structures: [1:Fascia: No Fat Layer (Subcutaneous Tissue) Exposed: No Tendon: No Muscle: No Joint: No Bone: No] [N/A:N/A] Epithelialization: [1:Large (67-100%)] [N/A:N/A] Periwound Skin Texture: [1:No Abnormalities Noted] [N/A:N/A] Periwound Skin Moisture: [1:No Abnormalities Noted] [N/A:N/A] Periwound Skin Color: [1:No Abnormalities Noted] [N/A:N/A] Temperature: [1:No Abnormality] [N/A:N/A] Tenderness on Palpation: No N/A N/A Wound Preparation: Ulcer Cleansing: N/A N/A Rinsed/Irrigated with Saline Topical Anesthetic Applied: None Treatment Notes Electronic Signature(s) Signed: 01/14/2018 2:05:53 PM  By: Lawanda Cousins Entered By: Lawanda Cousins on 01/14/2018 14:05:52 [place]hill, Mcgregor Jerilynn Mages ([id number]) -------------------------------------------------------------------------------- [place] Details Patient Name: Eduardo [name]. Date of Service: 01/14/2018 1:15 PM Medical Record Number: [id number] Patient Account Number: [id number] Date of Birth/Sex: 1930/08/28 (84 y.o. M) Treating RN: Ahmed Prima Primary Care Lawrence Mitch: [name] Other Clinician: Referring Salsabeel Gorelick: [name] Treating Renell Allum/Extender: Lawanda Cousins Weeks in Treatment: 9 Active Inactive Electronic Signature(s) Signed: 01/16/2018 11:21:53 AM By: Alric Quan Previous Signature: 01/14/2018 3:52:40 PM Version By: Alric Quan Entered By: Alric Quan on 01/16/2018 11:21:53 Nordlund, Naren M. ([id number]) -------------------------------------------------------------------------------- Pain Assessment Details Patient Name: Eduardo [name]. Date of Service: 01/14/2018 1:15 PM Medical Record Number: [id number] Patient Account Number: [id number] Date of Birth/Sex: [birth year] (84 y.o. M) Treating RN: [name] Primary Care Ensley Blas: [name] Other Clinician: Referring Rontae Inglett: [name] Treating Demetrica Zipp/Extender: Cathie Olden in Treatment: 9 Active Problems Location of Pain Severity and Description of Pain Patient Has Paino No Site Locations Pain Management and Medication Current Pain Management: Electronic Signature(s) Signed: 01/14/2018 4:05:36 PM By: [name] Entered By: [name] on 01/14/2018 13:28:24 Gervasi, Daishawn M. ([id number]) -------------------------------------------------------------------------------- Patient/Caregiver Education Details Patient Name: Eduardo [name]. Date of Service: 01/14/2018 1:15 PM Medical Record Number: [id number] Patient Account Number: [id number] Date of Birth/Gender: 24-Nov-1930 (84 y.o.  M) Treating RN: Ahmed Prima Primary Care Physician: [name] Other Clinician: Referring Physician: [name] Treating Physician/Extender: Cathie Olden in Treatment: 9 Education Assessment Education Provided To: Patient Education Topics Provided Wound/Skin Impairment: Handouts: Other: Please call our office if you have any questions or concerns. Methods: Explain/Verbal Responses: State content correctly Electronic Signature(s) Signed: 01/14/2018 3:52:40 PM By: Alric Quan Entered By: Alric Quan on 01/14/2018 14:05:25 Pullin, Hiawatha M. ([id number]) -------------------------------------------------------------------------------- Wound Assessment Details Patient Name: Eduardo [name]. Date of Service: 01/14/2018 1:15 PM Medical Record Number: [id number] Patient Account Number: [id number] Date of Birth/Sex: 1931-04-10 (84 y.o. M) Treating RN: Ahmed Prima Primary Care Yoland Scherr: [name] Other Clinician: Referring Quaneshia Wareing: [name] Treating Gavan Nordby/Extender: Cathie Olden in Treatment: 9 Wound Status Wound Number: 1 Primary Trauma, Other Etiology: Wound Location: Right Lower Leg Wound Healed - Epithelialized Wounding Event: Trauma Status: Date Acquired: 10/15/2017 Comorbid Glaucoma, Chronic Obstructive Pulmonary Weeks Of Treatment: 9 History: Disease (COPD), Arrhythmia, Congestive Heart Clustered Wound: No Failure, Coronary Artery Disease, Myocardial Infarction, Neuropathy Photos Photo Uploaded By: [name] on 01/14/2018 14:27:23 Wound Measurements Length: (cm) 0 % Red Width: (cm) 0 % Red Depth: (cm) 0 Epith Area: (cm) 0 Tunn Volume: (cm) 0 Unde uction in Area: 100% uction in Volume: 100% elialization: Large (67-100%) eling: No rmining: No Wound Description Full Thickness Without Exposed Support Classification: Structures Exudate None Present Amount: Wound Bed Granulation Amount:  None  Present (0%) Exposed Structure Necrotic Amount: None Present (0%) Fascia Exposed: No Fat Layer (Subcutaneous Tissue) Exposed: No Tendon Exposed: No Muscle Exposed: No Joint Exposed: No Bone Exposed: No Periwound Skin Texture Dorion, Breanna M. ([id number]) Texture Color No Abnormalities Noted: No No Abnormalities Noted: No Moisture Temperature / Pain No Abnormalities Noted: No Temperature: No Abnormality Wound Preparation Ulcer Cleansing: Rinsed/Irrigated with Saline Topical Anesthetic Applied: None Electronic Signature(s) Signed: 01/14/2018 3:52:40 PM By: Alric Quan Entered By: Alric Quan on 01/14/2018 14:03:32 Rauch, Waller M. ([id number]) -------------------------------------------------------------------------------- [place] Details Patient Name: Eduardo [name]. Date of Service: 01/14/2018 1:15 PM Medical Record Number: [id number] Patient Account Number: [id number] Date of Birth/Sex: 07/09/1930 (84 y.o. M) Treating RN: [name] Primary Care Vega Withrow: [name] Other Clinician: Referring Sylus Stgermain: [name] Treating Maili Shutters/Extender: Cathie Olden in Treatment: 9 Vital Signs Time Taken: 13:28 Temperature (F): 98.1 Height (in): 62 Pulse (bpm): 77 Weight (lbs): 153.7 Respiratory Rate (breaths/min): 18 Body Mass Index (BMI): 28.1 Blood Pressure (mmHg): 137/58 Reference Range: 80 - 120 mg / dl Electronic Signature(s) Signed: 01/14/2018 4:05:36 PM By: [name] Entered By: [name] on 01/14/2018 13:29:04

## 2018-01-29 ENCOUNTER — Ambulatory Visit (INDEPENDENT_AMBULATORY_CARE_PROVIDER_SITE_OTHER): Payer: PPO | Admitting: Orthopaedic Surgery

## 2018-01-29 ENCOUNTER — Ambulatory Visit (INDEPENDENT_AMBULATORY_CARE_PROVIDER_SITE_OTHER): Payer: PPO

## 2018-01-29 ENCOUNTER — Encounter (INDEPENDENT_AMBULATORY_CARE_PROVIDER_SITE_OTHER): Payer: Self-pay | Admitting: Orthopaedic Surgery

## 2018-01-29 DIAGNOSIS — S32010D Wedge compression fracture of first lumbar vertebra, subsequent encounter for fracture with routine healing: Secondary | ICD-10-CM

## 2018-01-29 NOTE — Progress Notes (Signed)
The patient is following up for for known L1 compression fracture.  He is 84 years old.  He is able with a cane.  He says range of motion strength are still limited and he would like to get back to the gym but is still sore as well.  He has not been any type of back brace.  This was a lumbar spine compression fracture at L1 that was a significant loss of height.  On exam he still has lumbar spine pain with flexion extension and palpation.  He is got good strength in bilateral extremities with pain in the sciatic region on both hips with left worse than right.  The plain films lumbar spine today compared to previous films show an L1 compression fracture with interval healing.  There is been no further loss of height but there is significant compression deformity.  This point we will hold off on sending him for any type of kyphoplasty given the fact that the fracture now appears stable.  I would like to see him back in about 6 weeks with a repeat standing AP and lateral lumbar spine.

## 2018-02-03 ENCOUNTER — Ambulatory Visit: Payer: PPO | Admitting: Emergency Medicine

## 2018-02-03 ENCOUNTER — Encounter: Payer: Self-pay | Admitting: Emergency Medicine

## 2018-02-03 DIAGNOSIS — J301 Allergic rhinitis due to pollen: Secondary | ICD-10-CM | POA: Diagnosis not present

## 2018-02-03 DIAGNOSIS — J439 Emphysema, unspecified: Secondary | ICD-10-CM

## 2018-02-03 NOTE — Assessment & Plan Note (Signed)
Please continue Stiolto 2 puffs once daily. Keep albuterol available to use 2 puffs or 1 nebulizer treatment up to every 4 hours if needed for shortness of breath, chest tightness, wheezing. Your pneumonia shots are up-to-date. Please get the high-dose flu shot this fall. Follow with Dr Lamonte Sakai in 6 months or sooner if you have any problems

## 2018-02-03 NOTE — Patient Instructions (Signed)
Please continue Stiolto 2 puffs once daily. Keep albuterol available to use 2 puffs or 1 nebulizer treatment up to every 4 hours if needed for shortness of breath, chest tightness, wheezing. Please continue Zyrtec 10 mg once daily. Please continue Atrovent nasal spray, 2 sprays each nostril up to 3 times a day if needed for nasal drainage. Please continue Singulair 10 mg each evening. Speak with Dr. [name] about whether you should stay on your Protonix. Your pneumonia shots are up-to-date. Please get the high-dose flu shot this fall. Follow with Dr Lamonte Sakai in 6 months or sooner if you have any problems

## 2018-02-03 NOTE — Progress Notes (Signed)
  Subjective:    Patient ID: Eduardo [name], male    DOB: [birth year], 84 y.o.   MRN: [id number] HPI  ROV 08/05/17 --84 year old former smoker with a history of coronary artery disease, atrial fibrillation, allergic rhinitis, chronic cough with upper airway instability.  He also has COPD with an FEV1 of 1.03 L.  He is currently managed on Stiolto.  Also uses Zyrtec, Atrovent nasal spray for his chronic rhinitis. He still goes to the gym 3x a week. He began to get some increased congestion beginning of the year. Has been using mucinex, robutussin. Reliable with Stiolto. He has albuterol nebs, uses about 1x a day.   ROV 02/03/18 --follow-up visit for 84 year old gentleman with coronary disease, atrial fibrillation, COPD (FEV1 1.03 L), chronic cough and upper airway instability principally driven by allergic rhinitis.  He was seen here on several occasions last month with an acute exacerbation and persistent symptoms possibly driven by his allergies. He had inadvertantly stopped his singulair, has improved significantly since restarting. He is also on zyrtec, atrovent NS qhs. He is on protonix as well, taking qod.  He is on Stiolto reliably. Uses albuterol about once a day. Rare albuterol neb use.     Review of Systems As per HPI    Objective:  Physical Exam Vitals:   02/03/18 1329  BP: 120/66  Pulse: 87  SpO2: 97%  Weight: 140 lb (63.5 kg)  Height: 5\' 3"  (1.6 m)   Gen: Pleasant, well-nourished, in no distress,  normal affect  ENT: No lesions,  mouth clear,  oropharynx clear, no postnasal drip  Neck: No JVD, no stridor  Lungs: No use of accessory muscles, clear B   Cardiovascular: irregular, heart sounds normal, no murmur or gallops, no peripheral edema  Musculoskeletal: No deformities, no cyanosis or clubbing  Neuro: alert, non focal  Skin: Warm, no lesions or rashes   Assessment & Plan:  COPD GOLD II B Please continue Stiolto 2 puffs once daily. Keep albuterol available to  use 2 puffs or 1 nebulizer treatment up to every 4 hours if needed for shortness of breath, chest tightness, wheezing. Your pneumonia shots are up-to-date. Please get the high-dose flu shot this fall. Follow with Dr Lamonte Sakai in 6 months or sooner if you have any problems  Allergic rhinitis Please continue Zyrtec 10 mg once daily. Please continue Atrovent nasal spray, 2 sprays each nostril up to 3 times a day if needed for nasal drainage. Please continue Singulair 10 mg each evening.  Baltazar Apo, MD, PhD 02/03/2018, 1:47 PM [facility] [phone] or if no answer [phone]

## 2018-02-03 NOTE — Assessment & Plan Note (Signed)
Please continue Zyrtec 10 mg once daily. Please continue Atrovent nasal spray, 2 sprays each nostril up to 3 times a day if needed for nasal drainage. Please continue Singulair 10 mg each evening.

## 2018-02-06 MED ORDER — ALBUTEROL SULFATE HFA 108 (90 BASE) MCG/ACT IN AERS: 2.0000 | INHALATION_SPRAY | Freq: Four times a day (QID) | RESPIRATORY_TRACT | 5 refills | Status: DC | PRN

## 2018-02-12 ENCOUNTER — Encounter (INDEPENDENT_AMBULATORY_CARE_PROVIDER_SITE_OTHER): Payer: Self-pay | Admitting: Orthopaedic Surgery

## 2018-02-13 ENCOUNTER — Other Ambulatory Visit (INDEPENDENT_AMBULATORY_CARE_PROVIDER_SITE_OTHER): Payer: Self-pay

## 2018-02-13 DIAGNOSIS — M4807 Spinal stenosis, lumbosacral region: Secondary | ICD-10-CM

## 2018-02-18 ENCOUNTER — Telehealth: Payer: Self-pay

## 2018-02-18 NOTE — Telephone Encounter (Signed)
Fax received from [facility], [place] For Eliquis 5MG  One tablet by mouth twice a day.  Please review for refill.  Thank you!

## 2018-02-19 MED ORDER — APIXABAN 5 MG PO TABS: 5.0000 mg | ORAL_TABLET | Freq: Two times a day (BID) | ORAL | 6 refills | Status: DC

## 2018-02-19 NOTE — Addendum Note (Signed)
Addended by: Dede Query R on: 02/19/2018 09:31 AM   Modules accepted: Orders

## 2018-02-25 DIAGNOSIS — F39 Unspecified mood [affective] disorder: Secondary | ICD-10-CM | POA: Diagnosis not present

## 2018-02-25 DIAGNOSIS — F411 Generalized anxiety disorder: Secondary | ICD-10-CM | POA: Diagnosis not present

## 2018-02-26 ENCOUNTER — Ambulatory Visit
Admission: RE | Admit: 2018-02-26 | Discharge: 2018-02-26 | Disposition: A | Discharge status: Home / Self Care | Payer: PPO | Source: Ambulatory Visit | Attending: Orthopaedic Surgery | Admitting: Orthopaedic Surgery

## 2018-02-26 DIAGNOSIS — M4807 Spinal stenosis, lumbosacral region: Secondary | ICD-10-CM

## 2018-02-26 DIAGNOSIS — M48061 Spinal stenosis, lumbar region without neurogenic claudication: Secondary | ICD-10-CM | POA: Diagnosis not present

## 2018-03-12 ENCOUNTER — Encounter (INDEPENDENT_AMBULATORY_CARE_PROVIDER_SITE_OTHER): Payer: Self-pay | Admitting: Orthopaedic Surgery

## 2018-03-12 ENCOUNTER — Ambulatory Visit (INDEPENDENT_AMBULATORY_CARE_PROVIDER_SITE_OTHER): Payer: PPO

## 2018-03-12 ENCOUNTER — Ambulatory Visit (INDEPENDENT_AMBULATORY_CARE_PROVIDER_SITE_OTHER): Payer: PPO | Admitting: Orthopaedic Surgery

## 2018-03-12 DIAGNOSIS — S32010D Wedge compression fracture of first lumbar vertebra, subsequent encounter for fracture with routine healing: Secondary | ICD-10-CM | POA: Diagnosis not present

## 2018-03-12 NOTE — Addendum Note (Signed)
Addended by: Brand Males E on: 03/12/2018 04:13 PM   Modules accepted: Orders

## 2018-03-12 NOTE — Progress Notes (Signed)
The patient is an 84 year old gentleman who is continued to recover from an L1 compression fracture.  He is ambulate with a cane he still has back pain throughout his lumbar spine and some of this was pre-existing his compression fracture.  MRI from 2016 showed multifactorial lumbar stenosis which is quite severe at L2-L3 and some at L3-L4 as well.  He did not sustain his L1 compression fracture and is lost over 50% of his height.  We sent her for an MRI for further relation of this.  MRI of his lumbar spine does show still edema at the L1 compression fracture area.  There is some bone fragments in the canal.  Stenosis overall significantly worsened since 2016.  At this point he is comfortable however he still having some amount of pain in his back.  He is ambulate well with a cane and not wearing a back support brace anymore.  At this point he does wish for referral to lose neurosurgery to see if he is a candidate for kyphoplasty versus any other type of intervention and I agree with sending him for this referral.

## 2018-03-13 ENCOUNTER — Ambulatory Visit (INDEPENDENT_AMBULATORY_CARE_PROVIDER_SITE_OTHER): Payer: PPO

## 2018-03-13 DIAGNOSIS — Z23 Encounter for immunization: Secondary | ICD-10-CM

## 2018-03-24 ENCOUNTER — Encounter: Payer: Self-pay | Admitting: *Deleted

## 2018-03-24 ENCOUNTER — Emergency Department: Payer: PPO

## 2018-03-24 ENCOUNTER — Emergency Department
Admission: EM | Admit: 2018-03-24 | Discharge: 2018-03-24 | Disposition: A | Discharge status: Home / Self Care | Payer: PPO | Attending: Emergency Medicine | Admitting: Emergency Medicine

## 2018-03-24 DIAGNOSIS — I2510 Atherosclerotic heart disease of native coronary artery without angina pectoris: Secondary | ICD-10-CM | POA: Diagnosis not present

## 2018-03-24 DIAGNOSIS — S299XXA Unspecified injury of thorax, initial encounter: Secondary | ICD-10-CM | POA: Diagnosis not present

## 2018-03-24 DIAGNOSIS — R0781 Pleurodynia: Secondary | ICD-10-CM | POA: Diagnosis not present

## 2018-03-24 DIAGNOSIS — Z79899 Other long term (current) drug therapy: Secondary | ICD-10-CM | POA: Diagnosis not present

## 2018-03-24 DIAGNOSIS — J449 Chronic obstructive pulmonary disease, unspecified: Secondary | ICD-10-CM | POA: Insufficient documentation

## 2018-03-24 DIAGNOSIS — M7918 Myalgia, other site: Secondary | ICD-10-CM | POA: Insufficient documentation

## 2018-03-24 DIAGNOSIS — I5032 Chronic diastolic (congestive) heart failure: Secondary | ICD-10-CM | POA: Insufficient documentation

## 2018-03-24 DIAGNOSIS — Z87891 Personal history of nicotine dependence: Secondary | ICD-10-CM | POA: Insufficient documentation

## 2018-03-24 DIAGNOSIS — R079 Chest pain, unspecified: Secondary | ICD-10-CM | POA: Diagnosis not present

## 2018-03-24 LAB — CBC WITH DIFFERENTIAL/PLATELET
ABS IMMATURE GRANULOCYTES: 0.09 10*3/uL — AB (ref 0.00–0.07)
Basophils Absolute: 0 10*3/uL (ref 0.0–0.1)
Basophils Relative: 1 %
EOS ABS: 0.3 10*3/uL (ref 0.0–0.5)
Eosinophils Relative: 4 %
HEMATOCRIT: 35.2 % — AB (ref 39.0–52.0)
Hemoglobin: 11.3 g/dL — ABNORMAL LOW (ref 13.0–17.0)
IMMATURE GRANULOCYTES: 1 %
LYMPHS ABS: 1.4 10*3/uL (ref 0.7–4.0)
Lymphocytes Relative: 16 %
MCH: 28.8 pg (ref 26.0–34.0)
MCHC: 32.1 g/dL (ref 30.0–36.0)
MCV: 89.6 fL (ref 80.0–100.0)
MONO ABS: 0.6 10*3/uL (ref 0.1–1.0)
MONOS PCT: 7 %
NEUTROS ABS: 6.3 10*3/uL (ref 1.7–7.7)
NEUTROS PCT: 71 %
Platelets: 229 10*3/uL (ref 150–400)
RBC: 3.93 MIL/uL — ABNORMAL LOW (ref 4.22–5.81)
RDW: 17 % — ABNORMAL HIGH (ref 11.5–15.5)
WBC: 8.8 10*3/uL (ref 4.0–10.5)
nRBC: 0 % (ref 0.0–0.2)

## 2018-03-24 LAB — TROPONIN I
TROPONIN I: 0.03 ng/mL — AB (ref <0.03)
Troponin I: 0.03 ng/mL (ref <0.03)

## 2018-03-24 LAB — BASIC METABOLIC PANEL
Anion gap: 8 (ref 5–15)
BUN: 39 mg/dL — AB (ref 8–23)
CO2: 26 mmol/L (ref 22–32)
CREATININE: 1 mg/dL (ref 0.61–1.24)
Calcium: 10 mg/dL (ref 8.9–10.3)
Chloride: 105 mmol/L (ref 98–111)
GFR calc Af Amer: >60 mL/min (ref >60)
GLUCOSE: 90 mg/dL (ref 70–99)
Potassium: 3.9 mmol/L (ref 3.5–5.1)
SODIUM: 139 mmol/L (ref 135–145)

## 2018-03-24 MED ORDER — TRAMADOL HCL 50 MG PO TABS: 50.0000 mg | ORAL_TABLET | Freq: Four times a day (QID) | ORAL | 0 refills | Status: DC | PRN

## 2018-03-24 MED ORDER — IBUPROFEN 400 MG PO TABS
400.0000 mg | ORAL_TABLET | Freq: Once | ORAL | Status: DC
  Filled 2018-03-24: qty 1

## 2018-03-24 MED ORDER — ACETAMINOPHEN 325 MG PO TABS
650.0000 mg | ORAL_TABLET | Freq: Once | ORAL | Status: AC
  Administered 2018-03-24: 650 mg via ORAL
  Filled 2018-03-24: qty 2

## 2018-03-24 NOTE — ED Notes (Signed)
Date and time results received: 03/24/18 1707   Test: Troponin Critical Value: 0.03  Name of Provider Notified: Dr. Reita Cliche  No immediate action is needed

## 2018-03-24 NOTE — ED Provider Notes (Signed)
-----------------------------------------   7:55 PM on 03/24/2018 -----------------------------------------   Blood pressure (!) 170/78, pulse 77, temperature 97.8 F (36.6 C), temperature source Oral, resp. rate 19, weight 63.5 kg, SpO2 92 %.  Assuming care from Dr. Reita Cliche.  In short, Eduardo [name] is a 84 y.o. male with a chief complaint of Marine scientist .  Refer to the original H&P for additional details.  The current plan of care is to repeat the troponin and discharge home if negative.   ----------------------------------------- 7:56 PM on 03/24/2018 -----------------------------------------  Second troponin is negative.  The patient is currently denying any chest pain while at rest.  The caregiver has requested that he have something stronger than Tylenol or ibuprofen if pain does not improve with these medications.  Prescription for tramadol will be submitted to the pharmacy of their choice.  She was also encouraged to use heat or ice 20 minutes/h while awake.  He was encouraged to splint the ribs with a pillow and take deep breaths multiple times per day and when he feels that he needs to cough or sneeze.        Victorino Dike, FNP 03/24/18 Adele Barthel, MD 03/27/18 [phone]
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
## 2018-03-25 ENCOUNTER — Telehealth: Payer: Self-pay
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
## 2018-03-27 ENCOUNTER — Encounter: Payer: Self-pay | Admitting: Internal Medicine

## 2018-03-27 ENCOUNTER — Ambulatory Visit (INDEPENDENT_AMBULATORY_CARE_PROVIDER_SITE_OTHER): Payer: PPO | Admitting: Internal Medicine

## 2018-03-27 VITALS — BP 136/74 | HR 74 | Temp 97.3°F | Ht 63.0 in | Wt 142.0 lb

## 2018-03-27 DIAGNOSIS — S298XXA Other specified injuries of thorax, initial encounter: Secondary | ICD-10-CM

## 2018-03-27 DIAGNOSIS — S20219A Contusion of unspecified front wall of thorax, initial encounter: Secondary | ICD-10-CM | POA: Insufficient documentation

## 2018-03-27 DIAGNOSIS — S20211A Contusion of right front wall of thorax, initial encounter: Secondary | ICD-10-CM
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
## 2018-04-03 NOTE — Telephone Encounter (Signed)
Please add him on for one of my same days tomorrow

## 2018-04-08 ENCOUNTER — Ambulatory Visit (INDEPENDENT_AMBULATORY_CARE_PROVIDER_SITE_OTHER): Payer: PPO | Admitting: Internal Medicine

## 2018-04-08 ENCOUNTER — Encounter: Payer: Self-pay | Admitting: Internal Medicine

## 2018-04-08 VITALS — BP 140/72 | HR 78 | Temp 98.0°F | Ht 63.0 in | Wt 139.0 lb

## 2018-04-08 DIAGNOSIS — M940 Chondrocostal junction syndrome [Tietze]: Secondary | ICD-10-CM
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
## 2018-04-08 NOTE — Progress Notes (Signed)
Subjective:    Patient ID: Eduardo [name], male    DOB: Nov 06, 1930, 84 y.o.   MRN: [id number]  HPI Here due to worsened chest pain His rib pain is now different--- spread out across chest Now feels it by sternum as well as right side Will yell out in pain when in car---frightens wife  Took left over hydrocodone--only helped "very little" Otherwise tries some tylenol--no help  Sleeping in recliner--has to stay on his back No sig cough--just from some post nasal drip   Current Outpatient Medications on File Prior to Visit  Medication Sig Dispense Refill  . acetaminophen (TYLENOL) 650 MG CR tablet Take 1,300 mg by mouth 2 (two) times daily.    Marland Kitchen albuterol (PROAIR HFA) 108 (90 Base) MCG/ACT inhaler Inhale 2 puffs into the lungs every 6 (six) hours as needed for wheezing or shortness of breath. 1 Inhaler 5  . albuterol (PROVENTIL) (2.5 MG/3ML) 0.083% nebulizer solution use 1 vial in nebulizer every 4 hours as needed for wheezing or shortness of breath 270 mL 2  . ALPRAZolam (XANAX) 0.25 MG tablet Take 1 tablet (0.25 mg total) by mouth 2 (two) times daily as needed for anxiety. 20 tablet 0  . apixaban (ELIQUIS) 5 MG TABS tablet Take 1 tablet (5 mg total) by mouth 2 (two) times daily. 60 tablet 6  . atorvastatin (LIPITOR) 80 MG tablet TAKE ONE TABLET BY MOUTH ONE TIME DAILY  90 tablet 3  . B Complex-C (SUPER B COMPLEX PO) Take 1 tablet by mouth at bedtime.     . cetirizine (ZYRTEC) 10 MG tablet Take 10 mg by mouth at bedtime.     . cholecalciferol (VITAMIN D) 1000 units tablet Take 1,000 Units by mouth daily.    Marland Kitchen escitalopram (LEXAPRO) 20 MG tablet Take 20 mg by mouth at bedtime.     . fenofibrate 160 MG tablet TAKE 1 TABLET BY MOUTH AT BEDTIME. 90 tablet 3  . furosemide (LASIX) 20 MG tablet TAKE 1 TABLET BY MOUTH DAILY 90 tablet 3  . guaiFENesin (ROBITUSSIN) 100 MG/5ML SOLN Take 5 mLs by mouth every 4 (four) hours as needed for cough or to loosen phlegm.    . hydrALAZINE  (APRESOLINE) 10 MG tablet Take 1 tablet (10 mg total) by mouth 3 (three) times daily. 270 tablet 3  . HYDROcodone-acetaminophen (NORCO/VICODIN) 5-325 MG tablet Take 1 tablet by mouth every 4 (four) hours as needed for moderate pain. 30 tablet 0  . ipratropium (ATROVENT) 0.03 % nasal spray PLACE 2 SPRAYS INTO BOTH NOSTRILS AT BEDTIME. 90 mL 3  . IRON PO Take by mouth.    . lamoTRIgine (LAMICTAL) 150 MG tablet Take 150 mg by mouth at bedtime. Reported on 08/14/2015    . montelukast (SINGULAIR) 10 MG tablet Take 1 tablet (10 mg total) by mouth at bedtime. 90 tablet 3  . Multiple Vitamin (MULTIVITAMIN) tablet Take 1 tablet by mouth at bedtime.     . pantoprazole (PROTONIX) 40 MG tablet TAKE 1 TABLET BY MOUTH DAILY BEFORE BREAKFAST. 90 tablet 3  . Tiotropium Bromide-Olodaterol (STIOLTO RESPIMAT) 2.5-2.5 MCG/ACT AERS Inhale 2 puffs into the lungs daily. 12 Inhaler 3  . triamcinolone cream (KENALOG) 0.1 % Apply 1 application topically 2 (two) times daily as needed (for skin).     . verapamil (CALAN-SR) 240 MG CR tablet TAKE 1 TABLET BY MOUTH AT BEDTIME 90 tablet 1   No current facility-administered medications on file prior to visit.     Allergies  Allergen Reactions  . Penicillins Itching    Has patient had a PCN reaction causing immediate rash, facial/tongue/throat swelling, SOB or lightheadedness with hypotension: Yes Has patient had a PCN reaction causing severe rash involving mucus membranes or skin necrosis: No Has patient had a PCN reaction that required hospitalization No Has patient had a PCN reaction occurring within the last 10 years: No If all of the above answers are "NO", then may proceed with Cephalosporin use.   . Sulfonamide Derivatives Other (See Comments)    "it affected my kidneys"  . Tape Other (See Comments)    Arms black and blue, tears skin.  Please use "paper" tape    Past Medical History:  Diagnosis Date  . Allergy   . Anemia 2012, 08/2015   chronic. Acute due to  large hematoma 2013. 2017: due to gastric AVM bleeding.   . Anxiety   . Atrial fibrillation ([facility]) 2012   a.  CHA2DS2VASc = 6-->eliquis;  b. 12/2015 Echo: EF 60-65%, no rwma, LVH, mild AS/MS/MR, sev dil LA, mild TR, PASP 60mmHg.  Marland Kitchen CAD (coronary artery disease)    a. 06/2010 Cath: LM nl, LAD 50p/m, LCX 68m, RCA 50p/m -->catheter dissection but good flow, 70d, RPDA 50-70;  b. 06/2010 Relook cath due to bradycardia and inf ST elev: RCA dissection @ ostium extending into coronary cusp and prox RCA, Ao dissection-->less staining than previously-->Med Rx.  Marland Kitchen COPD (chronic obstructive pulmonary disease) ([facility]) 2013   exertional dyspnea  . Gastric angiodysplasia with hemorrhage 08/2015  . Hx of colonic polyps 2001, 2011   adenomatous 2001. HP 2001, 2011.  Marland Kitchen Hyperlipidemia   . Myocardial infarction ([facility]) 06/2010.  12/2011.   "twice; 1 day apart; during/after cath". NQMI in setting severe anemia 2013.   Marland Kitchen Neuropathy 2014   in feet, likely due to spinal stenosis, DDD, HNP  . Osteoarthritis 2002   spinal stenosis, spondylolisthesis, disc protrusion multilevel in lumbar spine  . Stroke ([facility]) 2016  . Upper GI bleed 08/2015   EGD: 2 small gastric AVMs, 1 actively bleeding.  Both treated with APC ablation, clipping of the bleeder    Past Surgical History:  Procedure Laterality Date  . APPENDECTOMY    . CARDIAC CATHETERIZATION  2012   50% LAD and luminal irreg in RCA, 1/12  Post cath MI from damage  . CARPAL TUNNEL RELEASE     left  . CARPAL TUNNEL RELEASE  10/11/11   Dr Rush Barer  . CATARACT EXTRACTION W/ INTRAOCULAR LENS IMPLANT  2/13   Dr [name]  . COLONOSCOPY  2001, 02/2010   Dr Deatra Ina.   . ESOPHAGOGASTRODUODENOSCOPY N/A 08/15/2015   [name] MD; 2 small gastric AVMs, 1 actively bleeding.  Both treated with APC ablation, clipping of the bleeder  . FOOT FRACTURE SURGERY     right heel repair  . FRACTURE SURGERY    . KNEE ARTHROSCOPY     left, right knee 10/2009  . MASTOIDECTOMY       x3 on right  . NASAL SEPTUM SURGERY     nasal septal repair  . PENILE PROSTHESIS  REMOVAL    . PENILE PROSTHESIS IMPLANT     x 2--got infection after 2nd and had to remove  . PILONIDAL CYST / SINUS EXCISION    . SHOULDER ARTHROSCOPY     right  . TONSILLECTOMY AND ADENOIDECTOMY      Family History  Problem Relation Age of Onset  . Cancer Mother        ?  leukemia  . Heart attack Father   . Stroke Maternal Grandmother   . Diabetes Neg Hx   . Hypertension Neg Hx     Social History   Socioeconomic History  . Marital status: Married    Spouse name: Not on file  . Number of children: 4  . Years of education: some college  . Highest education level: Not on file  Occupational History  . Occupation: Retired Chief Strategy Officer the job trainin)  Social Needs  . Financial resource strain: Not on file  . Food insecurity:    Worry: Not on file    Inability: Not on file  . Transportation needs:    Medical: Not on file    Non-medical: Not on file  Tobacco Use  . Smoking status: Former Smoker    Packs/day: 2.50    Years: 30.00    Pack years: 75.00    Types: Cigarettes    Last attempt to quit: 06/11/1978    Years since quitting: 39.8  . Smokeless tobacco: Never Used  Substance and Sexual Activity  . Alcohol use: No    Comment: QUIT [id number] alcoholic  . Drug use: No  . Sexual activity: Not Currently  Lifestyle  . Physical activity:    Days per week: Not on file    Minutes per session: Not on file  . Stress: Not on file  Relationships  . Social connections:    Talks on phone: Not on file    Gets together: Not on file    Attends religious service: Not on file    Active member of club or organization: Not on file    Attends meetings of clubs or organizations: Not on file    Relationship status: Not on file  . Intimate partner violence:    Fear of current or ex partner: Not on file    Emotionally abused: Not on file    Physically abused: Not on file    Forced sexual  activity: Not on file  Other Topics Concern  . Not on file  Social History Narrative   Grew up in [place], [place].  Lives in [place].   Married---2nd   2 children ([place], [place]  and near [place])--2 stepchildren   Former Smoker--quit in 1980   Alcohol use-no. Quit in 1980--alcoholic      Has living will.   Has designated wife, then step daughter [name] have health care POA.   Would accept resuscitation attempts.   No feeding tube if cognitively unaware.   Review of Systems No fever No change in chronic SOB--but has to be careful not to take too big a breath    Objective:   Physical Exam  Constitutional: He appears well-developed. No distress.  Respiratory: Effort normal and breath sounds normal. No respiratory distress. He has no wheezes. He has no rales.  Focal tenderness along upper costocondral junctions on right (T3-4)           Assessment & Plan:

## 2018-04-15 ENCOUNTER — Other Ambulatory Visit: Payer: Self-pay | Admitting: *Deleted

## 2018-04-16 MED ORDER — APIXABAN 5 MG PO TABS: 5.0000 mg | ORAL_TABLET | Freq: Two times a day (BID) | ORAL | 3 refills | Status: DC

## 2018-04-22 ENCOUNTER — Encounter (INDEPENDENT_AMBULATORY_CARE_PROVIDER_SITE_OTHER): Payer: Self-pay | Admitting: *Deleted

## 2018-04-29 ENCOUNTER — Other Ambulatory Visit: Payer: Self-pay | Admitting: Interventional Cardiology

## 2018-04-30 NOTE — Telephone Encounter (Signed)
This is Dr. Rockey Situ pt now.

## 2018-05-01 MED ORDER — VERAPAMIL HCL ER 240 MG PO TBCR: 240.0000 mg | EXTENDED_RELEASE_TABLET | Freq: Every day | ORAL | 2 refills | Status: DC

## 2018-05-16 DIAGNOSIS — L239 Allergic contact dermatitis, unspecified cause: Secondary | ICD-10-CM | POA: Diagnosis not present

## 2018-05-16 DIAGNOSIS — L720 Epidermal cyst: Secondary | ICD-10-CM | POA: Diagnosis not present

## 2018-05-16 DIAGNOSIS — L738 Other specified follicular disorders: Secondary | ICD-10-CM | POA: Diagnosis not present

## 2018-06-04 ENCOUNTER — Other Ambulatory Visit: Payer: Self-pay | Admitting: Interventional Cardiology

## 2018-06-05 NOTE — Telephone Encounter (Signed)
Pt changed to [facility] in [place] this year

## 2018-06-08 NOTE — Telephone Encounter (Signed)
Dr. Rockey Situ patient.

## 2018-06-23 ENCOUNTER — Other Ambulatory Visit: Payer: Self-pay | Admitting: Internal Medicine

## 2018-06-25 DIAGNOSIS — F411 Generalized anxiety disorder: Secondary | ICD-10-CM | POA: Diagnosis not present

## 2018-06-25 DIAGNOSIS — F39 Unspecified mood [affective] disorder: Secondary | ICD-10-CM | POA: Diagnosis not present

## 2018-06-26 ENCOUNTER — Ambulatory Visit (INDEPENDENT_AMBULATORY_CARE_PROVIDER_SITE_OTHER): Payer: PPO | Admitting: Internal Medicine

## 2018-06-26 ENCOUNTER — Encounter: Payer: Self-pay | Admitting: Internal Medicine

## 2018-06-26 VITALS — BP 156/70 | HR 73 | Temp 97.4°F | Ht 61.0 in | Wt 143.0 lb

## 2018-06-26 DIAGNOSIS — M48062 Spinal stenosis, lumbar region with neurogenic claudication: Secondary | ICD-10-CM

## 2018-06-26 DIAGNOSIS — J439 Emphysema, unspecified: Secondary | ICD-10-CM | POA: Diagnosis not present

## 2018-06-26 DIAGNOSIS — F39 Unspecified mood [affective] disorder: Secondary | ICD-10-CM | POA: Diagnosis not present

## 2018-06-26 DIAGNOSIS — Z0000 Encounter for general adult medical examination without abnormal findings: Secondary | ICD-10-CM | POA: Diagnosis not present

## 2018-06-26 DIAGNOSIS — I5032 Chronic diastolic (congestive) heart failure: Secondary | ICD-10-CM | POA: Diagnosis not present

## 2018-06-26 DIAGNOSIS — I482 Chronic atrial fibrillation, unspecified: Secondary | ICD-10-CM

## 2018-06-26 DIAGNOSIS — Z7189 Other specified counseling: Secondary | ICD-10-CM

## 2018-06-26 DIAGNOSIS — F1021 Alcohol dependence, in remission: Secondary | ICD-10-CM | POA: Diagnosis not present

## 2018-06-26 NOTE — Assessment & Plan Note (Signed)
Compensated Mostly rate related when anemic On diuretic

## 2018-06-26 NOTE — Assessment & Plan Note (Signed)
It has been many decades

## 2018-06-26 NOTE — Progress Notes (Signed)
Hearing Screening Comments: Has hearing aids. Not wearing them today Vision Screening Comments: June 2019

## 2018-06-26 NOTE — Assessment & Plan Note (Signed)
Rate is fine On eliqis

## 2018-06-26 NOTE — Assessment & Plan Note (Signed)
Leg weakness Needs walker

## 2018-06-26 NOTE — Assessment & Plan Note (Signed)
Stable lung function on Rx Hasn't needed nebs lately

## 2018-06-26 NOTE — Assessment & Plan Note (Signed)
Chronic depression Some anxiety Mostly in remission with escitalopram
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
## 2018-06-26 NOTE — Assessment & Plan Note (Signed)
I have personally reviewed the Medicare Annual Wellness questionnaire and have noted 1. The patient's medical and social history 2. Their use of alcohol, tobacco or illicit drugs 3. Their current medications and supplements 4. The patient's functional ability including ADL's, fall risks, home safety risks and hearing or visual             impairment. 5. Diet and physical activities 6. Evidence for depression or mood disorders  The patients weight, height, BMI and visual acuity have been recorded in the chart I have made referrals, counseling and provided education to the patient based review of the above and I have provided the pt with a written personalized care plan for preventive services.  I have provided you with a copy of your personalized plan for preventive services. Please take the time to review along with your updated medication list.  Yearly flu vaccine On waiting list for shingrix No cancer screening due to age 33 risk is safety---should use walker, not cane

## 2018-06-26 NOTE — Assessment & Plan Note (Signed)
See social history 

## 2018-07-28 ENCOUNTER — Ambulatory Visit (INDEPENDENT_AMBULATORY_CARE_PROVIDER_SITE_OTHER): Payer: PPO | Admitting: Internal Medicine

## 2018-07-28 ENCOUNTER — Ambulatory Visit (INDEPENDENT_AMBULATORY_CARE_PROVIDER_SITE_OTHER)
Admission: RE | Admit: 2018-07-28 | Discharge: 2018-07-28 | Disposition: A | Discharge status: Home / Self Care | Payer: PPO | Source: Ambulatory Visit | Attending: Internal Medicine | Admitting: Internal Medicine

## 2018-07-28 ENCOUNTER — Encounter: Payer: Self-pay | Admitting: Internal Medicine

## 2018-07-28 VITALS — BP 146/64 | HR 102 | Temp 98.1°F | Resp 20 | Ht 61.0 in | Wt 135.5 lb

## 2018-07-28 DIAGNOSIS — R05 Cough: Secondary | ICD-10-CM | POA: Diagnosis not present

## 2018-07-28 DIAGNOSIS — I5032 Chronic diastolic (congestive) heart failure: Secondary | ICD-10-CM

## 2018-07-28 DIAGNOSIS — J441 Chronic obstructive pulmonary disease with (acute) exacerbation: Secondary | ICD-10-CM | POA: Diagnosis not present

## 2018-07-28 MED ORDER — PREDNISONE 20 MG PO TABS: 40.0000 mg | ORAL_TABLET | Freq: Every day | ORAL | 0 refills | Status: DC

## 2018-07-28 NOTE — Telephone Encounter (Signed)
Please call and apologize for the delay Not sure why it was sent to me when she is asking for appt (and I had some). Can offer 4:45PM if she doesn't want to wait till tomorrow for any appt for him

## 2018-07-28 NOTE — Assessment & Plan Note (Addendum)
Mild but some SOB and is tight Will add prednisone to the neb Rx No focal findings on chest---will just be sure with CXR  CXR shows no infiltrate, chronic lung changes, vascular congestion (but his weight is down) Will treat with prednisone and continue the nebs If worsens in next couple of days--would add z-pak

## 2018-07-28 NOTE — Assessment & Plan Note (Signed)
pulm vascular congestion on CXR but not evident from exam PND but not due to fluid--due to post nasal drip They will monitor his weight--we will adjust furosemide if it increases

## 2018-07-28 NOTE — Progress Notes (Signed)
Subjective:    Patient ID: Eduardo [name], male    DOB: 10/14/1930, 85 y.o.   MRN: [id number]  HPI Here with wife due to respiratory infection  Started 3 days ago---mild right earache and itching in throat Then stated with head congestion Started mucinex 2 days ago Also tried nebulizer---breath was "getting short" Had to sleep in the chair due to the PND Cough is more productive on the mucinex Wife noted wheezing since yesterday---some better today  No fever Breathing is okay---unless he exerts himself (runs out of air) No chills or sweats  Current Outpatient Medications on File Prior to Visit  Medication Sig Dispense Refill  . acetaminophen (TYLENOL) 650 MG CR tablet Take 1,300 mg by mouth 2 (two) times daily.    Marland Kitchen albuterol (PROAIR HFA) 108 (90 Base) MCG/ACT inhaler Inhale 2 puffs into the lungs every 6 (six) hours as needed for wheezing or shortness of breath. 1 Inhaler 5  . albuterol (PROVENTIL) (2.5 MG/3ML) 0.083% nebulizer solution use 1 vial in nebulizer every 4 hours as needed for wheezing or shortness of breath 270 mL 2  . ALPRAZolam (XANAX) 0.25 MG tablet Take 1 tablet (0.25 mg total) by mouth 2 (two) times daily as needed for anxiety. 20 tablet 0  . apixaban (ELIQUIS) 5 MG TABS tablet Take 1 tablet (5 mg total) by mouth 2 (two) times daily. 180 tablet 3  . atorvastatin (LIPITOR) 80 MG tablet TAKE 1 TABLET BY MOUTH ONCE DAILY 90 tablet 2  . B Complex-C (SUPER B COMPLEX PO) Take 1 tablet by mouth at bedtime.     . cetirizine (ZYRTEC) 10 MG tablet Take 10 mg by mouth at bedtime.     . cholecalciferol (VITAMIN D) 1000 units tablet Take 1,000 Units by mouth daily.    Marland Kitchen escitalopram (LEXAPRO) 20 MG tablet Take 20 mg by mouth at bedtime.     . fenofibrate 160 MG tablet TAKE ONE TABLET BY MOUTH AT BEDTIME  90 tablet 3  . furosemide (LASIX) 20 MG tablet TAKE 1 TABLET BY MOUTH DAILY 90 tablet 3  . guaiFENesin (ROBITUSSIN) 100 MG/5ML SOLN Take 5 mLs by mouth every 4 (four)  hours as needed for cough or to loosen phlegm.    . hydrALAZINE (APRESOLINE) 10 MG tablet Take 1 tablet (10 mg total) by mouth 3 (three) times daily. 270 tablet 3  . ipratropium (ATROVENT) 0.03 % nasal spray PLACE 2 SPRAYS INTO BOTH NOSTRILS AT BEDTIME. 90 mL 3  . IRON PO Take by mouth.    . lamoTRIgine (LAMICTAL) 150 MG tablet Take 150 mg by mouth at bedtime. Reported on 08/14/2015    . montelukast (SINGULAIR) 10 MG tablet Take 1 tablet (10 mg total) by mouth at bedtime. 90 tablet 3  . Multiple Vitamin (MULTIVITAMIN) tablet Take 1 tablet by mouth at bedtime.     . pantoprazole (PROTONIX) 40 MG tablet TAKE 1 TABLET BY MOUTH DAILY BEFORE BREAKFAST. 90 tablet 3  . Tiotropium Bromide-Olodaterol (STIOLTO RESPIMAT) 2.5-2.5 MCG/ACT AERS Inhale 2 puffs into the lungs daily. 12 Inhaler 3  . triamcinolone cream (KENALOG) 0.1 % Apply 1 application topically 2 (two) times daily as needed (for skin).     . verapamil (CALAN-SR) 240 MG CR tablet Take 1 tablet (240 mg total) by mouth at bedtime. 90 tablet 2   No current facility-administered medications on file prior to visit.     Allergies  Allergen Reactions  . Penicillins Itching    Has patient had a  PCN reaction causing immediate rash, facial/tongue/throat swelling, SOB or lightheadedness with hypotension: Yes Has patient had a PCN reaction causing severe rash involving mucus membranes or skin necrosis: No Has patient had a PCN reaction that required hospitalization No Has patient had a PCN reaction occurring within the last 10 years: No If all of the above answers are "NO", then may proceed with Cephalosporin use.   . Sulfonamide Derivatives Other (See Comments)    "it affected my kidneys"  . Tape Other (See Comments)    Arms black and blue, tears skin.  Please use "paper" tape    Past Medical History:  Diagnosis Date  . Allergy   . Anemia 2012, 08/2015   chronic. Acute due to large hematoma 2013. 2017: due to gastric AVM bleeding.   . Anxiety    . Atrial fibrillation ([place]) 2012   a.  CHA2DS2VASc = 6-->eliquis;  b. 12/2015 Echo: EF 60-65%, no rwma, LVH, mild AS/MS/MR, sev dil LA, mild TR, PASP 60mmHg.  Marland Kitchen CAD (coronary artery disease)    a. 06/2010 Cath: LM nl, LAD 50p/m, LCX 39m, RCA 50p/m -->catheter dissection but good flow, 70d, RPDA 50-70;  b. 06/2010 Relook cath due to bradycardia and inf ST elev: RCA dissection @ ostium extending into coronary cusp and prox RCA, Ao dissection-->less staining than previously-->Med Rx.  Marland Kitchen COPD (chronic obstructive pulmonary disease) ([place]) 2013   exertional dyspnea  . Gastric angiodysplasia with hemorrhage 08/2015  . Hx of colonic polyps 2001, 2011   adenomatous 2001. HP 2001, 2011.  Marland Kitchen Hyperlipidemia   . Myocardial infarction ([place]) 06/2010.  12/2011.   "twice; 1 day apart; during/after cath". NQMI in setting severe anemia 2013.   Marland Kitchen Neuropathy 2014   in feet, likely due to spinal stenosis, DDD, HNP  . Osteoarthritis 2002   spinal stenosis, spondylolisthesis, disc protrusion multilevel in lumbar spine  . Stroke ([place]) 2016  . Upper GI bleed 08/2015   EGD: 2 small gastric AVMs, 1 actively bleeding.  Both treated with APC ablation, clipping of the bleeder    Past Surgical History:  Procedure Laterality Date  . APPENDECTOMY    . CARDIAC CATHETERIZATION  2012   50% LAD and luminal irreg in RCA, 1/12  Post cath MI from damage  . CARPAL TUNNEL RELEASE     left  . CARPAL TUNNEL RELEASE  10/11/11   Dr Rush Barer  . CATARACT EXTRACTION W/ INTRAOCULAR LENS IMPLANT  2/13   Dr [name]  . COLONOSCOPY  2001, 02/2010   Dr Deatra Ina.   . ESOPHAGOGASTRODUODENOSCOPY N/A 08/15/2015   [name] MD; 2 small gastric AVMs, 1 actively bleeding.  Both treated with APC ablation, clipping of the bleeder  . FOOT FRACTURE SURGERY     right heel repair  . FRACTURE SURGERY    . KNEE ARTHROSCOPY     left, right knee 10/2009  . MASTOIDECTOMY     x3 on right  . NASAL SEPTUM SURGERY     nasal septal repair  .  PENILE PROSTHESIS  REMOVAL    . PENILE PROSTHESIS IMPLANT     x 2--got infection after 2nd and had to remove  . PILONIDAL CYST / SINUS EXCISION    . SHOULDER ARTHROSCOPY     right  . TONSILLECTOMY AND ADENOIDECTOMY      Family History  Problem Relation Age of Onset  . Cancer Mother        ? leukemia  . Heart attack Father   . Stroke Maternal  Grandmother   . Diabetes Neg Hx   . Hypertension Neg Hx     Social History   Socioeconomic History  . Marital status: Married    Spouse name: Not on file  . Number of children: 4  . Years of education: some college  . Highest education level: Not on file  Occupational History  . Occupation: Retired Chief Strategy Officer the job trainin)  Social Needs  . Financial resource strain: Not on file  . Food insecurity:    Worry: Not on file    Inability: Not on file  . Transportation needs:    Medical: Not on file    Non-medical: Not on file  Tobacco Use  . Smoking status: Former Smoker    Packs/day: 2.50    Years: 30.00    Pack years: 75.00    Types: Cigarettes    Last attempt to quit: 06/11/1978    Years since quitting: 40.1  . Smokeless tobacco: Never Used  Substance and Sexual Activity  . Alcohol use: No    Comment: QUIT [id number] alcoholic  . Drug use: No  . Sexual activity: Not Currently  Lifestyle  . Physical activity:    Days per week: Not on file    Minutes per session: Not on file  . Stress: Not on file  Relationships  . Social connections:    Talks on phone: Not on file    Gets together: Not on file    Attends religious service: Not on file    Active member of club or organization: Not on file    Attends meetings of clubs or organizations: Not on file    Relationship status: Not on file  . Intimate partner violence:    Fear of current or ex partner: Not on file    Emotionally abused: Not on file    Physically abused: Not on file    Forced sexual activity: Not on file  Other Topics Concern  . Not on file  Social  History Narrative   Grew up in [place], [place].  Lives in [place].   Married---2nd   2 children ([place], [place]  and near [place])--2 stepchildren   Former Smoker--quit in 1980   Alcohol use-no. Quit in 1980--alcoholic      Has living will.   Has designated wife, then step daughter [name] have health care POA.   Would accept resuscitation attempts.   No feeding tube if cognitively unaware.    Review of Systems Not sleeping great due to the noise of his breathing---did better last night though No vomiting or diarrhea Still eating okay    Objective:   Physical Exam  Constitutional: He appears well-developed. No distress.  HENT:  No sinus tenderness Moderate nasal inflammation---some bleeding along right septum TMs normal No sig pharyngeal injection  Neck: No thyromegaly present.  Respiratory: He has no rales.  Decreased breath sounds Mild prolonged expiratory phase and exp wheezing  Lymphadenopathy:    He has no cervical adenopathy.           Assessment & Plan:

## 2018-08-01 ENCOUNTER — Ambulatory Visit (INDEPENDENT_AMBULATORY_CARE_PROVIDER_SITE_OTHER): Payer: PPO | Admitting: Family Medicine

## 2018-08-01 ENCOUNTER — Encounter: Payer: Self-pay | Admitting: Family Medicine

## 2018-08-01 VITALS — BP 162/84 | HR 86 | Temp 97.5°F | Ht 61.0 in | Wt 147.3 lb

## 2018-08-01 DIAGNOSIS — J441 Chronic obstructive pulmonary disease with (acute) exacerbation: Secondary | ICD-10-CM

## 2018-08-01 DIAGNOSIS — I5032 Chronic diastolic (congestive) heart failure: Secondary | ICD-10-CM | POA: Diagnosis not present

## 2018-08-01 MED ORDER — ALBUTEROL SULFATE (2.5 MG/3ML) 0.083% IN NEBU
2.5000 mg | INHALATION_SOLUTION | Freq: Once | RESPIRATORY_TRACT | Status: AC
  Administered 2018-08-01: 2.5 mg via RESPIRATORY_TRACT

## 2018-08-01 MED ORDER — AZITHROMYCIN 250 MG PO TABS: ORAL_TABLET | ORAL | 0 refills | Status: DC

## 2018-08-01 NOTE — Patient Instructions (Addendum)
I am sending zithromax to the pharmacy   Increase your lasix from 20 mg to 40 mg today, sat, and Sunday  Please call to update [place] on Monday  Watch your sodium intake   Take prednisone taper as directed   If suddenly worse- get to the hospital / also call our number for the nurse line  Any other symptoms also let us know  Take either the mucinex DM or the robitussin    See pulmonary on Monday as planned

## 2018-08-01 NOTE — Progress Notes (Signed)
Subjective:    Patient ID: Eduardo [name], male    DOB: January 22, 1931, 85 y.o.   MRN: [id number]  HPI Here for symptoms of wheezing   Has h/o copd Seen on 2/17 by Dr [name]  Had cxr  Dg Chest 2 View  Result Date: 07/28/2018 CLINICAL DATA:  Coughing, wheezing, COPD, atrial fibrillation, coronary artery disease post MI, stroke, hypertension EXAM: CHEST - 2 VIEW COMPARISON:  03/24/2018 FINDINGS: Enlargement of cardiac silhouette with pulmonary vascular congestion. Tortuous thoracic aorta with atherosclerotic calcifications. Mitral annular calcification. Lungs appear mildly emphysematous with minimal central peribronchial thickening. No acute pulmonary infiltrate, pleural effusion or pneumothorax. Few sauces demineralization. Eventration of the RIGHT diaphragm again noted. IMPRESSION: Enlargement of cardiac silhouette with pulmonary vascular congestion. Chronic bronchitic and emphysematous changes consistent with history of COPD. No acute infiltrate. Electronically Signed   By: Lavonia Dana M.D.   On: 07/28/2018 14:38    Treated with prednisone and inst to continue nebs Mentioned -to consider zpak if worsens   Wt Readings from Last 3 Encounters:  08/01/18 147 lb 5 oz (66.8 kg)  07/28/18 135 lb 8 oz (61.5 kg)  06/26/18 143 lb (64.9 kg)   27.83 kg/m  Pulse ox is 93%  Takes lasix 20 mg   Wife does not think he sounds "a bit" better  Wheezing (sometimes on way out and way in)  More trouble breathing flat - so sleeps in a chair   He is having more swelling in legs  Does not think he is in heart failure right now  Does weigh himself- noted he gained at home   Has 6 mo check up with pulmonary doctor on Monday   BP Readings from Last 3 Encounters:  08/01/18 (!) 162/84  07/28/18 (!) 146/64  06/26/18 (!) 156/70   Pulse Readings from Last 3 Encounters:  08/01/18 86  07/28/18 (!) 102  06/26/18 73   No fever  Cough is throaty - from pnd  Some phlegm production - dark in color /no  blood  Despite prednisone breathing is worse  Sob with even 5-10 steps   A lot of nasal congestion    Taking mucinex atc  Using the nebulizer as directed as well  Taking his singulair  Neb helps wheezing for a bit   Patient Active Problem List   Diagnosis Date Noted  . Dysuria 12/31/2017  . Constipation 12/31/2017  . CAD (coronary artery disease), native coronary artery 12/28/2017  . Bilateral carotid artery stenosis 12/04/2017  . Laceration of right leg excluding thigh 10/23/2017  . Alcohol dependence in remission ([place]) 06/24/2017  . Imbalance 02/05/2017  . Mitral regurgitation 07/17/2016  . COPD exacerbation ([place]) 07/14/2016  . Dyslipidemia 07/14/2016  . Encounter for anticoagulation discussion and counseling 05/03/2015  . Chronic anticoagulation 03/01/2015  . 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 TIA (transient ischemic attack) 12/31/2014  . Advanced directives, counseling/discussion 04/19/2014  . Spinal stenosis, lumbar region, with neurogenic claudication 12/23/2012  . Osteoarthritis, multiple sites 10/08/2012  . Preventative health care 02/25/2012  . MI, old with prior NSTEMI x 2 12/28/2011  . Chronic diastolic HF (heart failure) ([place]) 12/28/2011    Class: Acute  . Allergic rhinitis 11/11/2008  . Mood disorder ([place]) 04/14/2008  . Essential hypertension 04/14/2008  . Chronic atrial fibrillation 04/14/2008  . COPD GOLD II B 04/14/2008   Past Medical History:  Diagnosis Date  . Allergy   . Anemia 2012, 08/2015   chronic. Acute due to large hematoma  2013. 2017: due to gastric AVM bleeding.   . Anxiety   . Atrial fibrillation ([place]) 2012   a.  CHA2DS2VASc = 6-->eliquis;  b. 12/2015 Echo: EF 60-65%, no rwma, LVH, mild AS/MS/MR, sev dil LA, mild TR, PASP 47mmHg.  Marland Kitchen CAD (coronary artery disease)    a. 06/2010 Cath: LM nl, LAD 50p/m, LCX 65m, RCA 50p/m -->catheter dissection but good flow, 70d, RPDA 50-70;  b. 06/2010 Relook cath due to bradycardia  and inf ST elev: RCA dissection @ ostium extending into coronary cusp and prox RCA, Ao dissection-->less staining than previously-->Med Rx.  Marland Kitchen COPD (chronic obstructive pulmonary disease) ([place]) 2013   exertional dyspnea  . Gastric angiodysplasia with hemorrhage 08/2015  . Hx of colonic polyps 2001, 2011   adenomatous 2001. HP 2001, 2011.  Marland Kitchen Hyperlipidemia   . Myocardial infarction ([place]) 06/2010.  12/2011.   "twice; 1 day apart; during/after cath". NQMI in setting severe anemia 2013.   Marland Kitchen Neuropathy 2014   in feet, likely due to spinal stenosis, DDD, HNP  . Osteoarthritis 2002   spinal stenosis, spondylolisthesis, disc protrusion multilevel in lumbar spine  . Stroke ([place]) 2016  . Upper GI bleed 08/2015   EGD: 2 small gastric AVMs, 1 actively bleeding.  Both treated with APC ablation, clipping of the bleeder   Past Surgical History:  Procedure Laterality Date  . APPENDECTOMY    . CARDIAC CATHETERIZATION  2012   50% LAD and luminal irreg in RCA, 1/12  Post cath MI from damage  . CARPAL TUNNEL RELEASE     left  . CARPAL TUNNEL RELEASE  10/11/11   Dr Rush Barer  . CATARACT EXTRACTION W/ INTRAOCULAR LENS IMPLANT  2/13   Dr [name]  . COLONOSCOPY  2001, 02/2010   Dr Deatra Ina.   . ESOPHAGOGASTRODUODENOSCOPY N/A 08/15/2015   [name] MD; 2 small gastric AVMs, 1 actively bleeding.  Both treated with APC ablation, clipping of the bleeder  . FOOT FRACTURE SURGERY     right heel repair  . FRACTURE SURGERY    . KNEE ARTHROSCOPY     left, right knee 10/2009  . MASTOIDECTOMY     x3 on right  . NASAL SEPTUM SURGERY     nasal septal repair  . PENILE PROSTHESIS  REMOVAL    . PENILE PROSTHESIS IMPLANT     x 2--got infection after 2nd and had to remove  . PILONIDAL CYST / SINUS EXCISION    . SHOULDER ARTHROSCOPY     right  . TONSILLECTOMY AND ADENOIDECTOMY     Social History   Tobacco Use  . Smoking status: Former Smoker    Packs/day: 2.50    Years: 30.00    Pack years: 75.00      Types: Cigarettes    Last attempt to quit: 06/11/1978    Years since quitting: 40.1  . Smokeless tobacco: Never Used  Substance Use Topics  . Alcohol use: No    Comment: QUIT [id number] alcoholic  . Drug use: No   Family History  Problem Relation Age of Onset  . Cancer Mother        ? leukemia  . Heart attack Father   . Stroke Maternal Grandmother   . Diabetes Neg Hx   . Hypertension Neg Hx    Allergies  Allergen Reactions  . Penicillins Itching    Has patient had a PCN reaction causing immediate rash, facial/tongue/throat swelling, SOB or lightheadedness with hypotension: Yes Has patient had a PCN reaction causing  severe rash involving mucus membranes or skin necrosis: No Has patient had a PCN reaction that required hospitalization No Has patient had a PCN reaction occurring within the last 10 years: No If all of the above answers are "NO", then may proceed with Cephalosporin use.   . Sulfonamide Derivatives Other (See Comments)    "it affected my kidneys"  . Tape Other (See Comments)    Arms black and blue, tears skin.  Please use "paper" tape   Current Outpatient Medications on File Prior to Visit  Medication Sig Dispense Refill  . acetaminophen (TYLENOL) 650 MG CR tablet Take 1,300 mg by mouth 2 (two) times daily.    Marland Kitchen albuterol (PROAIR HFA) 108 (90 Base) MCG/ACT inhaler Inhale 2 puffs into the lungs every 6 (six) hours as needed for wheezing or shortness of breath. 1 Inhaler 5  . albuterol (PROVENTIL) (2.5 MG/3ML) 0.083% nebulizer solution use 1 vial in nebulizer every 4 hours as needed for wheezing or shortness of breath 270 mL 2  . ALPRAZolam (XANAX) 0.25 MG tablet Take 1 tablet (0.25 mg total) by mouth 2 (two) times daily as needed for anxiety. 20 tablet 0  . apixaban (ELIQUIS) 5 MG TABS tablet Take 1 tablet (5 mg total) by mouth 2 (two) times daily. 180 tablet 3  . atorvastatin (LIPITOR) 80 MG tablet TAKE 1 TABLET BY MOUTH ONCE DAILY 90 tablet 2  . B Complex-C (SUPER B  COMPLEX PO) Take 1 tablet by mouth at bedtime.     . cetirizine (ZYRTEC) 10 MG tablet Take 10 mg by mouth at bedtime.     . cholecalciferol (VITAMIN D) 1000 units tablet Take 1,000 Units by mouth daily.    Marland Kitchen escitalopram (LEXAPRO) 20 MG tablet Take 20 mg by mouth at bedtime.     . fenofibrate 160 MG tablet TAKE ONE TABLET BY MOUTH AT BEDTIME  90 tablet 3  . furosemide (LASIX) 20 MG tablet TAKE 1 TABLET BY MOUTH DAILY 90 tablet 3  . guaiFENesin (ROBITUSSIN) 100 MG/5ML SOLN Take 5 mLs by mouth every 4 (four) hours as needed for cough or to loosen phlegm.    . hydrALAZINE (APRESOLINE) 10 MG tablet Take 1 tablet (10 mg total) by mouth 3 (three) times daily. 270 tablet 3  . ipratropium (ATROVENT) 0.03 % nasal spray PLACE 2 SPRAYS INTO BOTH NOSTRILS AT BEDTIME. 90 mL 3  . IRON PO Take by mouth.    . lamoTRIgine (LAMICTAL) 150 MG tablet Take 150 mg by mouth at bedtime. Reported on 08/14/2015    . montelukast (SINGULAIR) 10 MG tablet Take 1 tablet (10 mg total) by mouth at bedtime. 90 tablet 3  . Multiple Vitamin (MULTIVITAMIN) tablet Take 1 tablet by mouth at bedtime.     . pantoprazole (PROTONIX) 40 MG tablet TAKE 1 TABLET BY MOUTH DAILY BEFORE BREAKFAST. 90 tablet 3  . predniSONE (DELTASONE) 20 MG tablet Take 2 tablets (40 mg total) by mouth daily. For 5 days, then 1 tab daily for 5 days 15 tablet 0  . Tiotropium Bromide-Olodaterol (STIOLTO RESPIMAT) 2.5-2.5 MCG/ACT AERS Inhale 2 puffs into the lungs daily. 12 Inhaler 3  . triamcinolone cream (KENALOG) 0.1 % Apply 1 application topically 2 (two) times daily as needed (for skin).     . verapamil (CALAN-SR) 240 MG CR tablet Take 1 tablet (240 mg total) by mouth at bedtime. 90 tablet 2   No current facility-administered medications on file prior to visit.     Review of Systems  Constitutional: Positive for fatigue and unexpected weight change. Negative for activity change, appetite change, chills and fever.  HENT: Positive for postnasal drip. Negative  for congestion, facial swelling, rhinorrhea, sinus pressure, sinus pain, sore throat and trouble swallowing.   Eyes: Negative for pain, redness, itching and visual disturbance.  Respiratory: Positive for cough, chest tightness, shortness of breath and wheezing. Negative for stridor.   Cardiovascular: Positive for leg swelling. Negative for chest pain and palpitations.  Gastrointestinal: Negative for abdominal pain, blood in stool, constipation, diarrhea and nausea.  Endocrine: Negative for cold intolerance, heat intolerance, polydipsia and polyuria.  Genitourinary: Negative for difficulty urinating, dysuria, frequency and urgency.  Musculoskeletal: Negative for arthralgias, joint swelling and myalgias.  Skin: Negative for pallor and rash.  Neurological: Negative for dizziness, tremors, weakness, numbness and headaches.  Hematological: Negative for adenopathy. Does not bruise/bleed easily.  Psychiatric/Behavioral: Negative for decreased concentration and dysphoric mood. The patient is not nervous/anxious.        Objective:   Physical Exam Constitutional:      General: He is not in acute distress.    Appearance: Normal appearance. He is normal weight. He is not ill-appearing.  HENT:     Head: Normocephalic and atraumatic.     Right Ear: Tympanic membrane and ear canal normal.     Left Ear: Tympanic membrane and ear canal normal.     Nose: Rhinorrhea present.     Mouth/Throat:     Mouth: Mucous membranes are moist.     Pharynx: Oropharynx is clear. No oropharyngeal exudate or posterior oropharyngeal erythema.  Eyes:     General: No scleral icterus.       Right eye: No discharge.        Left eye: No discharge.     Conjunctiva/sclera: Conjunctivae normal.     Pupils: Pupils are equal, round, and reactive to light.  Neck:     Musculoskeletal: Neck supple. No neck rigidity.  Cardiovascular:     Rate and Rhythm: Normal rate and regular rhythm.     Heart sounds: Normal heart sounds.    Pulmonary:     Effort: Pulmonary effort is normal. No respiratory distress.     Breath sounds: No stridor. Wheezing present. No rhonchi or rales.     Comments: Diffusely distant bs  Diffuse exp wheezes with slt prolonged exp phase  Improved after albuterol nmt somewhat (with better air exch in bases) Scant crackles at bases No rales or rhonchi Chest:     Chest wall: No tenderness.  Musculoskeletal:     Right lower leg: Edema present.     Left lower leg: Edema present.     Comments: One plus pitting pedal edema   Lymphadenopathy:     Cervical: No cervical adenopathy.  Skin:    General: Skin is warm and dry.     Findings: No rash.  Neurological:     Mental Status: He is alert.     Coordination: Coordination normal.  Psychiatric:        Mood and Affect: Mood normal.           Assessment & Plan:   Problem List Items Addressed This Visit      Cardiovascular and Mediastinum   Chronic diastolic HF (heart failure) ([place])    I do wonder if he is experiencing symptoms of this with increased sob/wheeze and PND, also pulm vascular congestion on last xray  Has gained 12 lb and has more pedal edema (has also been on prednisone)  Pt and wife say that he had no improvement in wheezing from prednisone Will plan to inc lasix from 20 to 40 mg today, sat and Sunday and then call with update inst to call/ go to ED if symptoms suddenly worsen        Respiratory   COPD exacerbation ([place]) - Primary    Not responding to prednisone taper  Added zpak today  Wheezing responded partially to nmt of albuterol in the office today  ? If CHF may be adding to this  Will also inc lasix for 3 d with update/close f/u      Relevant Medications   albuterol (PROVENTIL) (2.5 MG/3ML) 0.083% nebulizer solution 2.5 mg (Completed)   azithromycin (ZITHROMAX Z-PAK) 250 MG tablet

## 2018-08-02 NOTE — Assessment & Plan Note (Signed)
I do wonder if he is experiencing symptoms of this with increased sob/wheeze and PND, also pulm vascular congestion on last xray  Has gained 12 lb and has more pedal edema (has also been on prednisone) Pt and wife say that he had no improvement in wheezing from prednisone Will plan to inc lasix from 20 to 40 mg today, sat and Sunday and then call with update inst to call/ go to ED if symptoms suddenly worsen

## 2018-08-02 NOTE — Assessment & Plan Note (Signed)
Not responding to prednisone taper  Added zpak today  Wheezing responded partially to nmt of albuterol in the office today  ? If CHF may be adding to this  Will also inc lasix for 3 d with update/close f/u

## 2018-08-04 ENCOUNTER — Encounter: Payer: Self-pay | Admitting: Family Medicine

## 2018-08-04 ENCOUNTER — Encounter: Payer: Self-pay | Admitting: Emergency Medicine

## 2018-08-04 ENCOUNTER — Telehealth: Payer: Self-pay | Admitting: Cardiovascular Disease

## 2018-08-04 ENCOUNTER — Ambulatory Visit: Payer: PPO | Admitting: Emergency Medicine

## 2018-08-04 DIAGNOSIS — J439 Emphysema, unspecified: Secondary | ICD-10-CM | POA: Diagnosis not present

## 2018-08-04 DIAGNOSIS — I5032 Chronic diastolic (congestive) heart failure: Secondary | ICD-10-CM | POA: Diagnosis not present

## 2018-08-04 DIAGNOSIS — J301 Allergic rhinitis due to pollen: Secondary | ICD-10-CM

## 2018-08-04 NOTE — Assessment & Plan Note (Addendum)
Treated for an exacerbation of COPD.  He did have brown mucus and it sounds consistent with a flare, probably brought on by URI.  He is improving, now has persistent upper airway noise but I do not hear any lower airways wheezing.  He does have a history of upper airway instability and it may be that this lags behind.  If it persists then we will see him back, see if we can adjust any potential contributors  Please complete the prednisone and azithromycin as prescribed.  Please continue your Stiolto 2 puffs once daily. Keep your albuterol available to use if needed for shortness of breath, chest tightness, wheezing.  You do not have to take this on a schedule.

## 2018-08-04 NOTE — Assessment & Plan Note (Signed)
Continue Zyrtec and Atrovent nasal spray as you have been using them.

## 2018-08-04 NOTE — Telephone Encounter (Signed)
Spoke with wife, ok per DPR. They saw pulmonologist, Dr Lamonte Sakai, today in [place].  Per wife, they feel it is more related to pulmonology but felt it would be good to see cardiologist in the near future as well. Ignacia Bayley, NP had opening this Thursday. She was agreeable. Appointment scheduled.

## 2018-08-04 NOTE — Telephone Encounter (Signed)
New Message  Pts wife verbalized he has COPD, Eczema,  & A-Fib.   Pts wife verbalized drs want to rule out if there's any issue with his heart and his breathing.   Pts wife verbalized she wants someone to listen to his heart that's all.  Offered 3.25 with PA but pt declined and wants someone to call her.  Please f/u

## 2018-08-04 NOTE — Patient Instructions (Addendum)
Please complete the prednisone and azithromycin as prescribed.  Please continue your Stiolto 2 puffs once daily. Keep your albuterol available to use if needed for shortness of breath, chest tightness, wheezing.  You do not have to take this on a schedule. Continue Zyrtec and Atrovent nasal spray as you have been using them. Keep track of your leg swelling.  This information will be helpful as your primary physicians work to adjust the dose of your Lasix. Agree with following up with cardiology in the near future. If you have persistent cough, throat irritation, throat noise or chest noise then please call our office and we will see you to reevaluate, make adjustments. Follow with Dr Lamonte Sakai in 4 months or sooner if you have any problems.

## 2018-08-04 NOTE — Assessment & Plan Note (Signed)
Keep track of your leg swelling.  This information will be helpful as your primary physicians work to adjust the dose of your Lasix. Agree with following up with cardiology in the near future.

## 2018-08-04 NOTE — Progress Notes (Signed)
Subjective:    Patient ID: Eduardo [name], male    DOB: [birth year], 85 y.o.   MRN: [id number] HPI  ROV 08/05/17 --85 year old former smoker with a history of coronary artery disease, atrial fibrillation, allergic rhinitis, chronic cough with upper airway instability.  He also has COPD with an FEV1 of 1.03 L.  He is currently managed on Stiolto.  Also uses Zyrtec, Atrovent nasal spray for his chronic rhinitis. He still goes to the gym 3x a week. He began to get some increased congestion beginning of the year. Has been using mucinex, robutussin. Reliable with Stiolto. He has albuterol nebs, uses about 1x a day.   ROV 02/03/18 --follow-up visit for 85 year old gentleman gentleman with coronary disease, atrial fibrillation, COPD (FEV1 1.03 L), chronic cough and upper airway instability principally driven by allergic rhinitis.  He was seen here on several occasions last month with an acute exacerbation and persistent symptoms possibly driven by his allergies. He had inadvertantly stopped his singulair, has improved significantly since restarting. He is also on zyrtec, atrovent NS qhs. He is on protonix as well, taking qod.  He is on Stiolto reliably. Uses albuterol about once a day. Rare albuterol neb use.   ROV 08/04/18 --85 year old gentleman, former smoker with CAD and atrial fibrillation.  Also COPD with upper airway instability and allergic rhinitis.  We have been managing him on Stiolto, Zyrtec, Atrovent nasal spray.  He began to have some difficulty earlier this month, describes increased cough with brownish mucous, orthopnea, increased LE edema.  He has been treated with prednisone, but then showed evidence of increased edema and continued dyspnea.  His Lasix was therefore increased from 20 to 40 mg and azithromycin was added.     Review of Systems As per HPI    Objective:  Physical Exam Vitals:   08/04/18 1056  BP: (!) 160/78  Pulse: 83  SpO2: 99%  Weight: 146 lb (66.2 kg)  Height: 5\' 1"  (1.549 m)    Gen: Pleasant, well-nourished, in no distress,  normal affect  ENT: No lesions,  mouth clear,  oropharynx clear, no postnasal drip  Neck: No JVD, stridor present today  Lungs: No use of accessory muscles, referred UA noise, no overt wheeze  Cardiovascular: irregular, heart sounds normal, no murmur or gallops, no peripheral edema  Musculoskeletal: No deformities, no cyanosis or clubbing  Neuro: alert, non focal  Skin: Warm, no lesions or rashes   Assessment & Plan:  COPD GOLD II B Treated for an exacerbation of COPD.  He did have brown mucus and it sounds consistent with a flare, probably brought on by URI.  He is improving, now has persistent upper airway noise but I do not hear any lower airways wheezing.  He does have a history of upper airway instability and it may be that this lags behind.  If it persists then we will see him back, see if we can adjust any potential contributors  Please complete the prednisone and azithromycin as prescribed.  Please continue your Stiolto 2 puffs once daily. Keep your albuterol available to use if needed for shortness of breath, chest tightness, wheezing.  You do not have to take this on a schedule.   Allergic rhinitis Continue Zyrtec and Atrovent nasal spray as you have been using them.   Chronic diastolic HF (heart failure) ([place]) Keep track of your leg swelling.  This information will be helpful as your primary physicians work to adjust the dose of your Lasix. Agree with following up with  cardiology in the near future.  Eduardo Apo, MD, PhD 08/04/2018, 11:30 AM [facility] [phone] or if no answer [phone]

## 2018-08-07 ENCOUNTER — Encounter: Payer: Self-pay | Admitting: Nurse Practitioner

## 2018-08-07 ENCOUNTER — Ambulatory Visit: Payer: PPO | Admitting: Nurse Practitioner

## 2018-08-07 VITALS — BP 134/60 | HR 86 | Ht 61.5 in | Wt 146.5 lb

## 2018-08-07 DIAGNOSIS — I4821 Permanent atrial fibrillation: Secondary | ICD-10-CM

## 2018-08-07 DIAGNOSIS — I2510 Atherosclerotic heart disease of native coronary artery without angina pectoris: Secondary | ICD-10-CM

## 2018-08-07 DIAGNOSIS — I10 Essential (primary) hypertension: Secondary | ICD-10-CM | POA: Diagnosis not present

## 2018-08-07 DIAGNOSIS — I5043 Acute on chronic combined systolic (congestive) and diastolic (congestive) heart failure: Secondary | ICD-10-CM

## 2018-08-07 DIAGNOSIS — I5032 Chronic diastolic (congestive) heart failure: Secondary | ICD-10-CM | POA: Diagnosis not present

## 2018-08-07 NOTE — Progress Notes (Signed)
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  Past Medical History    Past Medical History:  Diagnosis Date  . Allergy   . Anemia 2012, 08/2015   chronic. Acute due to large hematoma 2013. 2017: due to gastric AVM bleeding.   . Anxiety   . CAD (coronary artery disease)    a. 06/2010 Cath: LM nl, LAD 50p/m, LCX 55m, RCA 50p/m -->catheter dissection but good flow, 70d, RPDA 50-70;  b. 06/2010 Relook cath due to bradycardia and inf ST elev: RCA dissection @ ostium extending into coronary cusp and prox RCA, Ao dissection-->less staining than previously-->Med Rx.  . Chronic combined systolic (congestive) and diastolic (congestive) heart failure ([place])    a. 12/2014 Echo: EF 60-65%; b. 07/2016 Echo: EF 45-50%, mild LVH. Mild AS/MS. Mod-sev MR. Sev dil LA/RA. Mildly reduced RV fxn. Mild to mod TR. PASP 87mmHg.  . CKD (chronic kidney disease), stage III ([place])   . COPD (chronic obstructive pulmonary disease) ([place]) 2013   exertional dyspnea  . Gastric angiodysplasia with hemorrhage 08/2015  . Hx of colonic polyps 2001, 2011   adenomatous 2001. HP 2001, 2011.  Marland Kitchen Hyperlipidemia   . Myocardial infarction ([place]) 06/2010.  12/2011.   "twice; 1 day apart; during/after cath". NQMI in setting severe anemia 2013.   Marland Kitchen Neuropathy 2014   in feet, likely due to spinal stenosis, DDD, HNP  . Osteoarthritis 2002   spinal stenosis, spondylolisthesis, disc protrusion multilevel  in lumbar spine  . Permanent atrial fibrillation 2012   a.  CHA2DS2VASc = 6-->eliquis;  b. 12/2015 Echo: EF 60-65%, no rwma, LVH, mild AS/MS/MR, sev dil LA, mild TR, PASP 14mmHg.  . Stroke ([place]) 2016  . Upper GI bleed 08/2015   EGD: 2 small gastric AVMs, 1 actively bleeding.  Both treated with APC ablation, clipping of the bleeder   Past Surgical History:  Procedure Laterality Date  . APPENDECTOMY    . CARDIAC CATHETERIZATION  2012   50% LAD and luminal irreg in RCA, 1/12  Post cath MI from damage  . CARPAL TUNNEL RELEASE     left  . CARPAL TUNNEL RELEASE  10/11/11   Dr Rush Barer  . CATARACT EXTRACTION W/ INTRAOCULAR LENS IMPLANT  2/13   Dr [name]  . COLONOSCOPY  2001, 02/2010   Dr Deatra Ina.   . ESOPHAGOGASTRODUODENOSCOPY N/A 08/15/2015   [name] MD; 2 small gastric AVMs, 1 actively bleeding.  Both treated with APC ablation, clipping of the bleeder  . FOOT FRACTURE SURGERY     right heel repair  . FRACTURE SURGERY    . KNEE ARTHROSCOPY     left, right knee 10/2009  . MASTOIDECTOMY     x3 on right  . NASAL SEPTUM SURGERY     nasal septal repair  . PENILE PROSTHESIS  REMOVAL    . PENILE PROSTHESIS IMPLANT     x 2--got infection after 2nd and had to remove  . PILONIDAL CYST / SINUS EXCISION    . SHOULDER  ARTHROSCOPY     right  . TONSILLECTOMY AND ADENOIDECTOMY      Allergies  Allergies  Allergen Reactions  . Penicillins Itching    Has patient had a PCN reaction causing immediate rash, facial/tongue/throat swelling, SOB or lightheadedness with hypotension: Yes Has patient had a PCN reaction causing severe rash involving mucus membranes or skin necrosis: No Has patient had a PCN reaction that required hospitalization No Has patient had a PCN reaction occurring within the last 10 years: No If all of the above answers are "NO", then may proceed with Cephalosporin use.   . Sulfonamide Derivatives Other (See Comments)    "it affected my kidneys"  . Tape Other  (See Comments)    Arms black and blue, tears skin.  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  This is been medically managed over the years.  Other history includes chronic combined systolic and diastolic congestive heart failure, permanent atrial fibrillation on Eliquis, prior embolic stroke, hypertension, hyperlipidemia, and COPD.  He was last seen in cardiology clinic in July 2019 and notes that following that visit, he had done well until about 2 to 3 weeks ago when he began to experience increasing sinus congestion with postnasal drip and productive cough of brown sputum.  He was seen by primary care on 17 February and was placed on prednisone in addition to home doses of nebulizer therapy.  Unfortunately, he did not notice any significant improvement in his dyspnea or secretions and subsequently began to experience increasing weight and lower extremity edema.  More recently, he is completed a Z-Pak but has also been using Sudafed every 4 hours for the past 3 days.  He says the Sudafed is really helped with his symptoms but he continues to have lower extremity swelling and his weight is up 7 pounds above his usual dry weight.  In the setting of sinus congestion and dyspnea, he has had orthopnea but denies chest pain, palpitations, PND, dizziness, syncope, or early satiety.  He has doubled up his Lasix from 20 mg daily to 40 mg daily and has not noticed any significant improvement in lower extremity swelling.  Home Medications    Prior to Admission medications   Medication Sig Start Date End Date Taking? Authorizing Provider  acetaminophen (TYLENOL) 650 MG CR tablet Take 1,300 mg  by mouth 2 (two) times daily.   Yes [provider]  albuterol (PROAIR HFA) 108 (90 Base) MCG/ACT inhaler Inhale 2 puffs into the lungs every 6 (six) hours as needed for wheezing or shortness of breath. 02/06/18  Yes Byrum, Rose Fillers, MD  albuterol (PROVENTIL) (2.5 MG/3ML) 0.083% nebulizer solution use 1 vial in nebulizer every 4 hours as needed for wheezing or shortness of breath 10/22/17  Yes Byrum, Rose Fillers, MD  ALPRAZolam Duanne Moron) 0.25 MG tablet Take 1 tablet (0.25 mg total) by mouth 2 (two) times daily as needed for anxiety. 12/11/17  Yes Venia Carbon, MD  apixaban (ELIQUIS) 5 MG TABS tablet Take 1 tablet (5 mg total) by mouth 2 (two) times daily. 04/16/18  Yes Minna Merritts, MD  atorvastatin (LIPITOR) 80 MG tablet TAKE 1 TABLET BY MOUTH ONCE DAILY 06/05/18  Yes Gollan, Kathlene November, MD  B Complex-C (  SUPER B COMPLEX PO) Take 1 tablet by mouth at bedtime.    Yes [provider]  cetirizine (ZYRTEC) 10 MG tablet Take 10 mg by mouth at bedtime.    Yes [provider]  cholecalciferol (VITAMIN D) 1000 units tablet Take 1,000 Units by mouth daily.   Yes [provider]  escitalopram (LEXAPRO) 20 MG tablet Take 20 mg by mouth at bedtime.    Yes [provider]  fenofibrate 160 MG tablet TAKE ONE TABLET BY MOUTH AT BEDTIME  06/23/18  Yes Venia Carbon, MD  furosemide (LASIX) 20 MG tablet TAKE 1 TABLET BY MOUTH DAILY 10/14/17  Yes Belva Crome, MD  hydrALAZINE (APRESOLINE) 10 MG tablet Take 1 tablet (10 mg total) by mouth 3 (three) times daily. 01/20/18  Yes Minna Merritts, MD  ipratropium (ATROVENT) 0.03 % nasal spray PLACE 2 SPRAYS INTO BOTH NOSTRILS AT BEDTIME. 08/05/17  Yes Byrum, Rose Fillers, MD  IRON PO Take by mouth.   Yes [provider]  lamoTRIgine (LAMICTAL) 150 MG tablet Take 150 mg by mouth at bedtime. Reported on 08/14/2015 07/07/15  Yes [provider]  montelukast (SINGULAIR) 10 MG tablet Take 1 tablet (10 mg total) by mouth at  bedtime. 12/20/17  Yes Lauraine Rinne, NP  Multiple Vitamin (MULTIVITAMIN) tablet Take 1 tablet by mouth at bedtime.    Yes [provider]  pantoprazole (PROTONIX) 40 MG tablet TAKE 1 TABLET BY MOUTH DAILY BEFORE BREAKFAST. 09/10/17  Yes Venia Carbon, MD  Tiotropium Bromide-Olodaterol (STIOLTO RESPIMAT) 2.5-2.5 MCG/ACT AERS Inhale 2 puffs into the lungs daily. 08/05/17  Yes Collene Gobble, MD  triamcinolone cream (KENALOG) 0.1 % Apply 1 application topically 2 (two) times daily as needed (for skin).  08/10/15  Yes [provider]  verapamil (CALAN-SR) 240 MG CR tablet Take 1 tablet (240 mg total) by mouth at bedtime. 05/01/18  Yes Minna Merritts, MD    Review of Systems    Dyspnea, orthopnea, productive cough, sinus congestion, and lower extremity edema over the past 2 to 3 weeks.  He denies chest pain, PND, dizziness, syncope, or early satiety.  All other systems reviewed and are otherwise negative except as noted above.  Physical Exam    VS:  BP 134/60 (BP Location: Left Arm, Patient Position: Sitting, Cuff Size: Normal)   Pulse 86   Ht 5' 1.5" (1.562 m)   Wt 146 lb 8 oz (66.5 kg)   BMI 27.23 kg/m  , BMI Body mass index is 27.23 kg/m. GEN: Well nourished, well developed, in no acute distress. HEENT: normal. Neck: Supple, no significant JVD, no carotid bruits, or masses. Cardiac: Irregularly irregular, no murmurs, rubs, or gallops. No clubbing, cyanosis, 1+ bilateral lower extremity edema.  Radials/DP/PT 2+ and equal bilaterally.  Respiratory:  Respirations regular and unlabored, faint expiratory wheezing.  No crackles. GI: Soft, nontender, nondistended, BS + x 4. MS: no deformity or atrophy. Skin: warm and dry, no rash. Neuro:  Strength and sensation are intact. Psych: Normal affect.  Accessory Clinical Findings    ECG personally reviewed by me today -atrial fibrillation, 86, nonspecific T changes- no acute changes.  Assessment & Plan    1.  Acute on  chronic combined systolic and diastolic congestive heart failure: Echocardiogram in 2018 showed an EF of 45 to 50%.  He also has a history of valvular heart disease.  Over the past 2 or so weeks, he has been having increasing lower extremity edema and  is up 7 pounds over his dry weight after recent prednisone therapy for upper respiratory congestion.  He has doubled up on his Lasix over the past few days without any significant change in lower extremity edema.  I am going to follow-up a basic metabolic panel today.  Provided that this looks okay, I have asked him to increase his Lasix to 40 mg twice daily over the next 3 days and then reduce the dose down to 40 mg daily and subsequently 20 mg daily once he is back to his dry weight.  I will arrange for a follow-up echocardiogram to reevaluate LV function and plan to see him back in clinic in 2 weeks.  2.  Upper respiratory infection: Status post recent prednisone treatment and a Z-Pak.  He has gotten significant relief by using Sudafed recently however, in the setting of cardiac history, I have advised against this.  His pulmonologist recommended that he use a Neti Pot and I also encouraged this.    3.  Essential hypertension: Stable despite recent Sudafed usage.    4.  Permanent atrial fibrillation: Rate controlled on verapamil therapy.  Pending echo, would consider discontinuation of calcium channel blocker in favor of beta-blocker though COPD likely limiting factor there.  He remains anticoagulated with Eliquis.  Following up basic metabolic panel today.  Provided that renal function stable, would continue current dose of Eliquis as he is 66.5 kg.  5.  Coronary artery disease: Prior history of moderate multivessel disease with dissection of ostial RCA secondary to catheter.  He has been medically managed since 2012 and has done well without chest pain.  Following up echo as above.  6.  Disposition: Follow-up basic metabolic panel today.  Arrange for  echo.  Follow-up in clinic in 2 weeks or sooner if necessary.  [name], NP 08/07/2018, 5:13 PM

## 2018-08-07 NOTE — Patient Instructions (Addendum)
Medication Instructions:  Your physician has recommended you make the following change in your medication:  1- TAKE Furosemide 80 mg by mouth once a day for 3 days, then decrease to 40 mg by mouth once a day until your weight returns to 135 lb. Once weight is 135 lb, the take furosemide 20 mg by mouth once a day.  If you need a refill on your cardiac medications before your next appointment, please call your pharmacy.   Lab work: Your physician recommends that you return for lab work in: [place] - BMET.  If you have labs (blood work) drawn today and your tests are completely normal, you will receive your results only by: Marland Kitchen MyChart Message (if you have MyChart) OR . A paper copy in the mail If you have any lab test that is abnormal or we need to change your treatment, we will call you to review the results.  Testing/Procedures: Your physician has requested that you have an echocardiogram. Echocardiography is a painless test that uses sound waves to create images of your heart. It provides your doctor with information about the size and shape of your heart and how well your heart's chambers and valves are working. This procedure takes approximately one hour. There are no restrictions for this procedure. You may get an IV, if needed, to receive an ultrasound enhancing agent through to better visualize your heart.    Follow-Up: At [facility], you and your health needs are our priority.  As part of our continuing mission to provide you with exceptional heart care, we have created designated Provider Care Teams.  These Care Teams include your primary Cardiologist (physician) and Advanced Practice Providers (APPs -  Physician Assistants and Nurse Practitioners) who all work together to provide you with the care you need, when you need it. You will need a follow up appointment in 2 weeks on March 10th at 12 noon.  Please call our office 2 months in advance to schedule this appointment.  You may see  Ida Rogue, MD or one of the following Advanced Practice Providers on your designated Care Team:   [name], NP Christell Faith, PA-C . Marrianne Mood, PA-C

## 2018-08-08 ENCOUNTER — Ambulatory Visit: Payer: Self-pay

## 2018-08-08 ENCOUNTER — Telehealth: Payer: Self-pay | Admitting: Cardiovascular Disease

## 2018-08-08 DIAGNOSIS — Z79899 Other long term (current) drug therapy: Secondary | ICD-10-CM

## 2018-08-08 DIAGNOSIS — N289 Disorder of kidney and ureter, unspecified: Secondary | ICD-10-CM

## 2018-08-08 LAB — BASIC METABOLIC PANEL
BUN/Creatinine Ratio: 25 — ABNORMAL HIGH (ref 10–24)
BUN: 35 mg/dL — AB (ref 8–27)
CO2: 24 mmol/L (ref 20–29)
CREATININE: 1.39 mg/dL — AB (ref 0.76–1.27)
Calcium: 10 mg/dL (ref 8.6–10.2)
Chloride: 102 mmol/L (ref 96–106)
GFR calc Af Amer: 53 mL/min/{1.73_m2} — ABNORMAL LOW (ref >59)
GFR, EST NON AFRICAN AMERICAN: 46 mL/min/{1.73_m2} — AB (ref >59)
Glucose: 104 mg/dL — ABNORMAL HIGH (ref 65–99)
Potassium: 4 mmol/L (ref 3.5–5.2)
Sodium: 142 mmol/L (ref 134–144)

## 2018-08-08 NOTE — Telephone Encounter (Signed)
Pt's wife notified and voiced understanding; [name] said she is not home now but she will call her husband to see how he feels and let him know Dr Rometta Emery comments. pts wife would not guarantee me she would take pt to ED but appreciated Dr Arley Phenix recommendation. FYI to Dr Diona Browner.

## 2018-08-08 NOTE — Telephone Encounter (Signed)
I spoke with the patient regarding his labs and his chest pain.  The patient states that he took is furosemide 80 mg this morning ~ 9:15 am, then about 10:30 am he developed chest & jaw pain, and a headache.  The pain was midsternal. All symptoms resolved after ~ 1.5 hours.  The patient is concerned the increased lasix caused his symptoms. I advised I did not think this was the case. His weight last night was 142 lbs at home and this morning he was 141.6 lbs. He has urinated well today.  I spoke with Ignacia Bayley, NP regarding the patient's symptoms. He did not feel this was related to the increased lasix dose.  Advised to monitor symptoms. Since we are just notifying the patient of his lab results, then no BNP needed, just a follow up BMP in 1 week.  I have advised the patient that Gerald Stabs felt his chest pain was unrelated to increased furosemide, however, he is aware that his labs were reviewed and his creatinine is a little elevated. The patient is aware to: 1) Decrease lasix to 40 mg once daily starting tomorrow until his weight is back to 135 lbs per his AVS, then decrease to 20 mg once daily 2) Repeat a BMP in 1 week- he will come to the office on 3/5 at 9:30 am. 3) Report to the ER for any ongoing chest pain over the weekend.   I will review with scheduling to see if the echo can be done prior to his 3/10 follow up appointment.

## 2018-08-08 NOTE — Telephone Encounter (Signed)
Reviewed cardiology recommendation. Issue has been addressed.

## 2018-08-08 NOTE — Telephone Encounter (Signed)
Notes recorded by Theora Gianotti, NP on 08/08/2018 at 8:12 AM EST Kidney's are looking a little dry. Ok to take lasix 80mg  this AM but will need to cut back down to 40 daily. Would it be possible to add on BNP to yesterdays labs? If not, could he go to lab today to have drawn? Will need f/u bmet in 1 wk. Also, would it be possible move echo to prior to his 3/10 appt? Important to keep legs elevated and continue to watch salt.

## 2018-08-08 NOTE — Telephone Encounter (Signed)
Hx of CAD, afib  Agree.. Pt need urgent eval.. at ER.  Let pt know MD agrees with  Triage recommendations.

## 2018-08-08 NOTE — Telephone Encounter (Signed)
Wife reports pt. Started having chest pain, "just a few minutes ago." "Midline and hurts into his throat." Pain rates 8/10. No shortness of breath,nausea or sweating. Saw cardiologist yesterday and "everything was fine." Reports he also has a headache. Does not want to go to ED. No availability at office today. Wife reports she will call his cardiologist.  Reason for Disposition . [1] Chest pain lasts > 5 minutes AND [2] occurred > 3 days ago (72 hours) AND [3] NO chest pain or cardiac symptoms now . SEVERE chest pain  Answer Assessment - Initial Assessment Questions 1. LOCATION: "Where does it hurt?"       Middle of center 2. RADIATION: "Does the pain go anywhere else?" (e.g., into neck, jaw, arms, back)     Throat 3. ONSET: "When did the chest pain begin?" (Minutes, hours or days)       A few minutes ago 4. PATTERN "Does the pain come and go, or has it been constant since it started?"  "Does it get worse with exertion?"      Constant 5. DURATION: "How long does it last" (e.g., seconds, minutes, hours)     Constant 6. SEVERITY: "How bad is the pain?"  (e.g., Scale 1-10; mild, moderate, or severe)    - MILD (1-3): doesn't interfere with normal activities     - MODERATE (4-7): interferes with normal activities or awakens from sleep    - SEVERE (8-10): excruciating pain, unable to do any normal activities         8 7. CARDIAC RISK FACTORS: "Do you have any history of heart problems or risk factors for heart disease?" (e.g., prior heart attack, angina; high blood pressure, diabetes, being overweight, high cholesterol, smoking, or strong family history of heart disease)     High cholesterol 8. PULMONARY RISK FACTORS: "Do you have any history of lung disease?"  (e.g., blood clots in lung, asthma, emphysema, birth control pills)     No 9. CAUSE: "What do you think is causing the chest pain?"     No 10. OTHER SYMPTOMS: "Do you have any other symptoms?" (e.g., dizziness, nausea, vomiting,  sweating, fever, difficulty breathing, cough)       No 11. PREGNANCY: "Is there any chance you are pregnant?" "When was your last menstrual period?"       n/a  Protocols used: CHEST PAIN-A-AH

## 2018-08-08 NOTE — Telephone Encounter (Signed)
Patient wife calling Patient was seen by Angelica Ran on 2/27 States that patient has been having a pain in chest that is going to the center of the chest and staying there Also having headaches Patient wife has called PCP but they are unable to see patient Patient wife would like him to be seen by someone today  Please call to advise

## 2018-08-08 NOTE — Telephone Encounter (Signed)
Dr [name] is out of office and sending to provider in office. Per pts chart review tab pts wife has a call in to cardiologist office. FYI to Dr Diona Browner

## 2018-08-11 NOTE — Telephone Encounter (Signed)
Pt is returning your call regarding his appointment change.

## 2018-08-11 NOTE — Telephone Encounter (Signed)
Patient scheduled for ECHO and labs on 3/3

## 2018-08-11 NOTE — Telephone Encounter (Signed)
LMOV to reschedule ECHO  Have placed a hold for 3/3 at 1pm Would also like to reschedule labs for 3/3 as well while patient is here in office   McGhee, Berline Lopes, RN  P [facility] Scheduling; [place] Triage        Is there anyway to move the patient's echo up to prior to his appt on 3/10 with Gerald Stabs.   Gerald Stabs was asking- see 2/28 phone note.   Thanks!

## 2018-08-11 NOTE — Telephone Encounter (Signed)
Please let us know if he is able to get these scheduled.

## 2018-08-12 ENCOUNTER — Other Ambulatory Visit (INDEPENDENT_AMBULATORY_CARE_PROVIDER_SITE_OTHER): Payer: PPO

## 2018-08-12 ENCOUNTER — Ambulatory Visit (INDEPENDENT_AMBULATORY_CARE_PROVIDER_SITE_OTHER): Payer: PPO

## 2018-08-12 DIAGNOSIS — I5043 Acute on chronic combined systolic (congestive) and diastolic (congestive) heart failure: Secondary | ICD-10-CM

## 2018-08-12 DIAGNOSIS — N289 Disorder of kidney and ureter, unspecified: Secondary | ICD-10-CM | POA: Diagnosis not present

## 2018-08-12 DIAGNOSIS — Z79899 Other long term (current) drug therapy: Secondary | ICD-10-CM | POA: Diagnosis not present

## 2018-08-13 ENCOUNTER — Telehealth: Payer: Self-pay | Admitting: *Deleted

## 2018-08-13 LAB — BASIC METABOLIC PANEL
BUN/Creatinine Ratio: 27 — ABNORMAL HIGH (ref 10–24)
BUN: 26 mg/dL (ref 8–27)
CALCIUM: 9.4 mg/dL (ref 8.6–10.2)
CHLORIDE: 101 mmol/L (ref 96–106)
CO2: 24 mmol/L (ref 20–29)
Creatinine, Ser: 0.95 mg/dL (ref 0.76–1.27)
GFR calc Af Amer: 84 mL/min/{1.73_m2} (ref >59)
GFR calc non Af Amer: 73 mL/min/{1.73_m2} (ref >59)
GLUCOSE: 128 mg/dL — AB (ref 65–99)
POTASSIUM: 3.7 mmol/L (ref 3.5–5.2)
Sodium: 141 mmol/L (ref 134–144)

## 2018-08-13 NOTE — Telephone Encounter (Signed)
-----   Message from Theora Gianotti, NP sent at 08/13/2018  9:27 AM EST ----- Kidney fxn looks better.  K stable.  How are symptoms, fluid status, weight?

## 2018-08-13 NOTE — Telephone Encounter (Signed)
Notes recorded by Theora Gianotti, NP on 08/12/2018 at 5:29 PM EST Normal left heart squeezing function (improved from prior). The right ventricle is mildly weakened with elevated pressures (likely similar to prior). The mitral valve is moderately to severely leaky, which is similar to 2018 study. The tricuspid valve is mildly to moderately leaky (sl worse). Overall a fairly similar study to 2018.  ---------------------------------------------------------------------------------------------------------------------  Results called to pt. Pt verbalized understanding. He reports his weight today was 138lb. Sleeping is no longer a problem and he can lay down with no problems with breathing. Not coughing all day anymore. Shortness of breath is back to normal for him (hx COPD). We reviewed his instructions: 1- TAKE Furosemide 80 mg by mouth once a day for 3 days, then decrease to 40 mg by mouth once a day until your weight returns to 135 lb. Once weight is 135 lb, the take furosemide 20 mg by mouth once a day.  He is currently taking furosemide 40 mg once a day. He is aware of upcoming appointment date and time next week. He was very Patent attorney.

## 2018-08-14 ENCOUNTER — Other Ambulatory Visit: Payer: PPO

## 2018-08-15 NOTE — Progress Notes (Signed)
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  Past Medical History    Past Medical History:  Diagnosis Date   Allergy    Anemia 2012, 08/2015   chronic. Acute due to large hematoma 2013. 2017: due to gastric AVM bleeding.    Anxiety    CAD (coronary artery disease)    a. 06/2010 Cath: LM nl, LAD 50p/m, LCX 25m, RCA 50p/m -->catheter dissection but good flow, 70d, RPDA 50-70;  b. 06/2010 Relook cath due to bradycardia and inf ST elev: RCA dissection @ ostium extending into coronary cusp and prox RCA, Ao dissection-->less staining than previously-->Med Rx.   Chronic combined systolic (congestive) and diastolic (congestive) heart failure ([place])    a. 12/2014 Echo: EF 60-65%; b. 07/2016 Echo: EF 45-50%, mild LVH. Mild AS/MS. Mod-sev MR. Mild to mod TR. PASP 11mmHg; c. 08/2018 Echo: EF 55-60%, RVSP 68.30mmHg. Sev dil LA, mod dil RA. at least mod MR, mild to mod TR. Mild AS.   COPD (chronic obstructive pulmonary disease) ([place]) 2013   exertional dyspnea   Gastric angiodysplasia with hemorrhage 08/2015   Hx of colonic polyps 2001, 2011   adenomatous 2001. HP 2001, 2011.   Hyperlipidemia    Mitral regurgitation    a. 07/2016 Echo: Mod to sev MR; b. 08/2018 Echo: at least moderate MR.   Myocardial infarction ([place]) 06/2010.  12/2011.   "twice; 1 day apart; during/after cath". NQMI in setting severe anemia 2013.    Neuropathy 2014   in feet, likely due  to spinal stenosis, DDD, HNP   Osteoarthritis 2002   spinal stenosis, spondylolisthesis, disc protrusion multilevel in lumbar spine   Permanent atrial fibrillation 2012   a.  CHA2DS2VASc = 6-->eliquis;  b. 12/2015 Echo: EF 60-65%, no rwma, LVH, mild AS/MS/MR, sev dil LA, mild TR, PASP 37mmHg.   Stroke [facility]) 2016   Upper GI bleed 08/2015   EGD: 2 small gastric AVMs, 1 actively bleeding.  Both treated with APC ablation, clipping of the bleeder   Past Surgical History:  Procedure Laterality Date   APPENDECTOMY     CARDIAC CATHETERIZATION  2012   50% LAD and luminal irreg in RCA, 1/12  Post cath MI from damage   CARPAL TUNNEL RELEASE     left   CARPAL TUNNEL RELEASE  10/11/11   Dr Rush Barer   CATARACT EXTRACTION W/ INTRAOCULAR LENS IMPLANT  2/13   Dr [name]   COLONOSCOPY  2001, 02/2010   Dr Deatra Ina.    ESOPHAGOGASTRODUODENOSCOPY N/A 08/15/2015   [name] MD; 2 small gastric AVMs, 1 actively bleeding.  Both treated with APC ablation, clipping of the bleeder   FOOT FRACTURE SURGERY     right heel repair   FRACTURE SURGERY     KNEE ARTHROSCOPY     left, right knee 10/2009   MASTOIDECTOMY     x3 on right   NASAL SEPTUM SURGERY     nasal septal repair   PENILE PROSTHESIS  REMOVAL     PENILE  PROSTHESIS IMPLANT     x 2--got infection after 2nd and had to remove   PILONIDAL CYST / SINUS EXCISION     SHOULDER ARTHROSCOPY     right   TONSILLECTOMY AND ADENOIDECTOMY      Allergies  Allergies  Allergen Reactions   Penicillins Itching    Has patient had a PCN reaction causing immediate rash, facial/tongue/throat swelling, SOB or lightheadedness with hypotension: Yes Has patient had a PCN reaction causing severe rash involving mucus membranes or skin necrosis: No Has patient had a PCN reaction that required hospitalization No Has patient had a PCN reaction occurring within the last 10 years: No If all of the above answers are "NO", then may proceed  with Cephalosporin use.    Sulfonamide Derivatives Other (See Comments)    "it affected my kidneys"   Tape Other (See Comments)    Arms black and blue, tears skin.  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 This has been medically managed over the years. Other history includes chronic combined systolic and diastolic congestive heart failure, permanent atrial fibrillation on Eliquis, prior embolic stroke, hypertension, hyperlipidemia, and COPD. He was last seen in cardiology clinic in February, at which time he was having lower extremity swelling and was up 7 pounds above his dry weight in the setting of recent treatment for upper respiratory infection with a Z-Pak, steroids, and over-the-counter Sudafed.  We have planned on increasing his Lasix to 80 mg daily x3 days however, creatinine returned that day at 1.39, so we just had him take Lasix 80 mg x 1 and then he reduced back down to 40 mg daily.    Since his last visit, he has noted significant improvement in volume status with return to his dry weight of 135 pounds on his home scale.  He continues to take Lasix 40 mg daily and would like to come down to 20 mg daily, which I had previously advised him he was able to do once he hit his dry weight.  He notes that he had an episode of substernal chest and jaw pain associated with a headache for 15 minutes intermittently a day after taking Lasix 80 mg.  Symptoms were fairly severe and lasted about 10 to 15 minutes and then resolve spontaneously.  They did recur later on the day but again were short-lived and resolve spontaneously.  His wife did call around try to get him an appointment  to be seen however she was referred to the ER, which was she was trying to avoid.  He ended up never being seen and has not any recurrence of those symptoms.  He has chronic dyspnea on exertion but has not been experiencing exertional chest discomfort.  He denies palpitations, PND, orthopnea, dizziness, syncope, edema, or early satiety.  Home Medications    Prior to Admission medications   Medication Sig Start Date End Date Taking? Authorizing Provider  acetaminophen (TYLENOL) 650 MG CR tablet Take 1,300 mg by mouth 2 (two) times daily.    [provider]  albuterol (PROAIR HFA) 108 (90 Base) MCG/ACT inhaler Inhale 2 puffs into the lungs every 6 (six) hours as needed for wheezing or shortness of breath. 02/06/18   Byrum, Rose Fillers,  MD  albuterol (PROVENTIL) (2.5 MG/3ML) 0.083% nebulizer solution use 1 vial in nebulizer every 4 hours as needed for wheezing or shortness of breath 10/22/17   Collene Gobble, MD  ALPRAZolam Duanne Moron) 0.25 MG tablet Take 1 tablet (0.25 mg total) by mouth 2 (two) times daily as needed for anxiety. 12/11/17   Venia Carbon, MD  apixaban (ELIQUIS) 5 MG TABS tablet Take 1 tablet (5 mg total) by mouth 2 (two) times daily. 04/16/18   Minna Merritts, MD  atorvastatin (LIPITOR) 80 MG tablet TAKE 1 TABLET BY MOUTH ONCE DAILY 06/05/18   Minna Merritts, MD  B Complex-C (SUPER B COMPLEX PO) Take 1 tablet by mouth at bedtime.     [provider]  cetirizine (ZYRTEC) 10 MG tablet Take 10 mg by mouth at bedtime.     [provider]  cholecalciferol (VITAMIN D) 1000 units tablet Take 1,000 Units by mouth daily.    [provider]  escitalopram (LEXAPRO) 20 MG tablet Take 20 mg by mouth at bedtime.     [provider]  fenofibrate 160 MG tablet TAKE ONE TABLET BY MOUTH AT BEDTIME  06/23/18   [name] I, MD  furosemide (LASIX) 20 MG tablet TAKE 1 TABLET BY MOUTH DAILY 10/14/17   Belva Crome, MD  hydrALAZINE (APRESOLINE) 10 MG tablet  Take 1 tablet (10 mg total) by mouth 3 (three) times daily. 01/20/18   Minna Merritts, MD  ipratropium (ATROVENT) 0.03 % nasal spray PLACE 2 SPRAYS INTO BOTH NOSTRILS AT BEDTIME. 08/05/17   Byrum, Rose Fillers, MD  IRON PO Take by mouth.    [provider]  lamoTRIgine (LAMICTAL) 150 MG tablet Take 150 mg by mouth at bedtime. Reported on 08/14/2015 07/07/15   [provider]  montelukast (SINGULAIR) 10 MG tablet Take 1 tablet (10 mg total) by mouth at bedtime. 12/20/17   Lauraine Rinne, NP  Multiple Vitamin (MULTIVITAMIN) tablet Take 1 tablet by mouth at bedtime.     [provider]  pantoprazole (PROTONIX) 40 MG tablet TAKE 1 TABLET BY MOUTH DAILY BEFORE BREAKFAST. 09/10/17   [name] I, MD  Tiotropium Bromide-Olodaterol (STIOLTO RESPIMAT) 2.5-2.5 MCG/ACT AERS Inhale 2 puffs into the lungs daily. 08/05/17   Collene Gobble, MD  triamcinolone cream (KENALOG) 0.1 % Apply 1 application topically 2 (two) times daily as needed (for skin).  08/10/15   [provider]  verapamil (CALAN-SR) 240 MG CR tablet Take 1 tablet (240 mg total) by mouth at bedtime. 05/01/18   Minna Merritts, MD    Review of Systems  Significant improvement in lower extremity swelling.  One episode of intermittent chest and jaw pain assoc w/ headache as outlined above. He denies palpitations, dyspnea, pnd, orthopnea, n, v, dizziness, syncope, edema, weight gain, or early satiety.  All other systems reviewed and are otherwise negative except as noted above.  Physical Exam    VS:  BP (!) 130/52 (BP Location: Left Arm, Patient Position: Sitting, Cuff Size: Normal)    Pulse 74    Ht 5' 1.5" (1.562 m)    Wt 140 lb 8 oz (63.7 kg)    BMI 26.12 kg/m  , BMI Body mass index is 26.12 kg/m. GEN: Well nourished, well developed, in no acute distress. HEENT: normal. Neck: Supple, no JVD, carotid bruits, or masses. Cardiac: Irregularly irregular, I do not appreciate a murmur, no rubs, or gallops. No clubbing,  cyanosis, edema.  Radials/PT 2+ and equal  bilaterally.  Respiratory:  Respirations regular and unlabored, clear to auscultation bilaterally. GI: Soft, nontender, nondistended, BS + x 4. MS: no deformity or atrophy. Skin: warm and dry, no rash. Neuro:  Strength and sensation are intact. Psych: Normal affect.  Accessory Clinical Findings    ECG personally reviewed by me today -atrial fibrillation, 74, PVC- no acute changes.  Lab Results  Component Value Date   CREATININE 0.95 08/12/2018   BUN 26 08/12/2018   NA 141 08/12/2018   K 3.7 08/12/2018   CL 101 08/12/2018   CO2 24 08/12/2018   Lab Results  Component Value Date   CHOL 132 06/24/2017   HDL 44.80 06/24/2017   LDLCALC UNABLE TO CALCULATE IF TRIGLYCERIDE OVER 400 mg/dL 12/31/2014   LDLDIRECT 69.0 06/24/2017   TRIG (H) 06/24/2017    1031.0 Triglyceride is over 400; calculations on Lipids are invalid.   CHOLHDL 3 06/24/2017     Assessment & Plan    1.  Chronic diastolic congestive heart failure: Patient recently evaluated in the setting of increasing lower extremity edema, and 7 pound weight gain in the setting of recent treatment with steroids and ongoing treatment with over-the-counter Sudafed.  I had him increase his Lasix to 80 mg x 1 day and he has since been taking 40 mg daily.  With that, he has had significant improvement in lower extremity edema and is now back down to his dry weight of 135 pounds on his home scale.  I recommended that he drop back down to his former Lasix dose of 20 mg daily.  An echocardiogram was performed last week that showed normal LV function and at least moderate mitral regurgitation.  He continues to have elevated pulmonary pressures, slightly worse than 2018.  We discussed the importance of daily weights, sodium restriction, medication compliance, and symptom reporting and he verbalizes understanding.  I advised that if weight increases or lower extremity edema returns on the lower dose, he may go  back to 40 mg of Lasix daily but needs to contact us immediately as we would like to follow-up his renal function in that setting.   2.  Mitral regurgitation: Previously felt to be moderate to severe in 2018 and at least moderate by echo this month.  He has chronic dyspnea on exertion in the setting of COPD and emphysema but recent volume overload worried him.  I advised that it be reasonable to seek the opinion of our structural heart team as MitraClip might be a reasonable option for him pending severity of mitral regurgitation on a TEE (would have to be performed).  He and his wife plan to read some about mitral clip and would potentially be interested in a referral and will contact us in the future.   3.  Permanent atrial fibrillation: Rate controlled on calcium channel blocker therapy.  He is anticoagulated with Eliquis 5 mg twice daily.  4.  Essential hypertension: Stable.  No longer using Sudafed.  5.  Coronary artery disease/chest pain: Prior history of moderate multivessel disease with dissection of the ostial RCA secondary to catheter manipulation.  He has been medically managed since 2012 and has done well but did have 2 episodes of chest discomfort in February.  These were short-lived and without associated symptoms.  He has had no recurrence.  We discussed options for evaluation and he is interested in stress testing. The benefits (risk stratification, diagnosing coronary artery disease, treatment guidance) and risks [chest pain, dyspnea, cardiac arrhythmias, dizziness, low blood pressure,  allergic reaction, heart attack, and life-threatening complications (estimated to be 1 in 10,000)] were discussed in detail with Mr. Blakely and he agrees to proceed.  Continue statin therapy.  We will send in a prescription for several nitroglycerin for him.  No aspirin in the setting of Eliquis.  6.  PAH:  Sl worse on recent echo compared to 2018 echo, in the setting of COPD/emphysema but also at least  moderate MR and diast HF.  Cont lasix  dropping dose to 20mg  daily (prior home dose).  7.  HL:  Cont statin rx.  8.  Disposition: 45 mins spent with Mr. And Mrs. Schwaever today.  Follow-up stress testing as above.  Follow-up with Dr. Rockey Situ in 2 months.  Patient will contact us for weight gain or edema, and or if he wishes to have a referral to structural heart clinic.   Yetta Glassman am acting as a Education administrator for [name], NP.  I have reviewed the above documentation for accuracy and completeness, and I agree with the above.   [name], NP 08/19/2018, 1:35 PM

## 2018-08-18 NOTE — Telephone Encounter (Signed)
Left message on VM to let us know how he was doing.

## 2018-08-18 NOTE — Telephone Encounter (Signed)
Was not seen by cardiology though Dr Rockey Situ ordered testing Please make sure he is okay

## 2018-08-19 ENCOUNTER — Ambulatory Visit: Payer: PPO | Admitting: Nurse Practitioner

## 2018-08-19 ENCOUNTER — Encounter: Payer: Self-pay | Admitting: Nurse Practitioner

## 2018-08-19 VITALS — BP 130/52 | HR 74 | Ht 61.5 in | Wt 140.5 lb

## 2018-08-19 DIAGNOSIS — I5032 Chronic diastolic (congestive) heart failure: Secondary | ICD-10-CM

## 2018-08-19 DIAGNOSIS — I10 Essential (primary) hypertension: Secondary | ICD-10-CM | POA: Diagnosis not present

## 2018-08-19 DIAGNOSIS — I340 Nonrheumatic mitral (valve) insufficiency: Secondary | ICD-10-CM | POA: Diagnosis not present

## 2018-08-19 DIAGNOSIS — I2721 Secondary pulmonary arterial hypertension: Secondary | ICD-10-CM | POA: Diagnosis not present

## 2018-08-19 DIAGNOSIS — E785 Hyperlipidemia, unspecified: Secondary | ICD-10-CM

## 2018-08-19 DIAGNOSIS — I4821 Permanent atrial fibrillation: Secondary | ICD-10-CM

## 2018-08-19 DIAGNOSIS — I25119 Atherosclerotic heart disease of native coronary artery with unspecified angina pectoris: Secondary | ICD-10-CM | POA: Diagnosis not present

## 2018-08-19 MED ORDER — NITROGLYCERIN 0.4 MG SL SUBL: 0.4000 mg | SUBLINGUAL_TABLET | SUBLINGUAL | 3 refills | Status: AC | PRN

## 2018-08-19 NOTE — Telephone Encounter (Signed)
Pt sent a MyChart Message stating he was feeling better

## 2018-08-19 NOTE — Patient Instructions (Signed)
Medication Instructions:  Your physician has recommended you make the following change in your medication:  1- Take Nitroglycerin as needed  Place 1 tablet (0.4 mg total) under the tongue every 5 (five) minutes as needed for chest pain.  If you need a refill on your cardiac medications before your next appointment, please call your pharmacy.   Lab work: None ordered  If you have labs (blood work) drawn today and your tests are completely normal, you will receive your results only by: Marland Kitchen MyChart Message (if you have MyChart) OR . A paper copy in the mail If you have any lab test that is abnormal or we need to change your treatment, we will call you to review the results.  Testing/Procedures: 1- Lexi  [facility] MYOVIEW  Your caregiver has ordered a Stress Test with nuclear imaging. The purpose of this test is to evaluate the blood supply to your heart muscle. This procedure is referred to as a "Non-Invasive Stress Test." This is because other than having an IV started in your vein, nothing is inserted or "invades" your body. Cardiac stress tests are done to find areas of poor blood flow to the heart by determining the extent of coronary artery disease (CAD). Some patients exercise on a treadmill, which naturally increases the blood flow to your heart, while others who are  unable to walk on a treadmill due to physical limitations have a pharmacologic/chemical stress agent called Lexiscan . This medicine will mimic walking on a treadmill by temporarily increasing your coronary blood flow.   Please note: these test may take anywhere between 2-4 hours to complete  PLEASE REPORT TO [place] AT THE FIRST DESK WILL DIRECT YOU WHERE TO GO  Date of Procedure:__________Request thursday___________________________  Arrival Time for Procedure:______________________________  Instructions regarding medication:   __x__:  Hold other medications as  follows:______________Lasix______________________________________  PLEASE NOTIFY THE OFFICE AT LEAST 24 HOURS IN ADVANCE IF YOU ARE UNABLE TO KEEP YOUR APPOINTMENT.  [phone] AND  PLEASE NOTIFY NUCLEAR MEDICINE AT [facility] AT LEAST 24 HOURS IN ADVANCE IF YOU ARE UNABLE TO KEEP YOUR APPOINTMENT. [phone]  How to prepare for your Myoview test:  1. Do not eat or drink after midnight 2. No caffeine for 24 hours prior to test 3. No smoking 24 hours prior to test. 4. Your medication may be taken with water.  If your doctor stopped a medication because of this test, do not take that medication. 5. Ladies, please do not wear dresses.  Skirts or pants are appropriate. Please wear a short sleeve shirt. 6. No perfume, cologne or lotion. 7. Wear comfortable walking shoes. No heels!    Follow-Up: At [facility], you and your health needs are our priority.  As part of our continuing mission to provide you with exceptional heart care, we have created designated Provider Care Teams.  These Care Teams include your primary Cardiologist (physician) and Advanced Practice Providers (APPs -  Physician Assistants and Nurse Practitioners) who all work together to provide you with the care you need, when you need it. You will need a follow up appointment in 2 months.  Please see Ida Rogue, MD.

## 2018-08-21 ENCOUNTER — Other Ambulatory Visit: Payer: Self-pay

## 2018-08-21 ENCOUNTER — Encounter
Admission: RE | Admit: 2018-08-21 | Discharge: 2018-08-21 | Disposition: A | Discharge status: Home / Self Care | Payer: PPO | Source: Ambulatory Visit | Attending: Nurse Practitioner | Admitting: Nurse Practitioner

## 2018-08-21 DIAGNOSIS — I25119 Atherosclerotic heart disease of native coronary artery with unspecified angina pectoris: Secondary | ICD-10-CM | POA: Diagnosis not present

## 2018-08-21 LAB — NM MYOCAR MULTI W/SPECT W/WALL MOTION / EF
CHL CUP MPHR: 135 {beats}/min
CHL CUP NUCLEAR SRS: 1
Estimated workload: 1 METS
Exercise duration (min): 0 min
Exercise duration (sec): 0 s
LV dias vol: 128 mL (ref 62–150)
LV sys vol: 47 mL
Peak HR: 46 {beats}/min
Percent HR: 35 %
Rest HR: 54 {beats}/min
SDS: 0
SSS: 1
TID: 0.92

## 2018-08-21 MED ORDER — TECHNETIUM TC 99M TETROFOSMIN IV KIT
30.0000 | PACK | Freq: Once | INTRAVENOUS | Status: AC | PRN
  Administered 2018-08-21: 31.76 via INTRAVENOUS

## 2018-08-21 MED ORDER — TECHNETIUM TC 99M TETROFOSMIN IV KIT
10.0000 | PACK | Freq: Once | INTRAVENOUS | Status: AC | PRN
  Administered 2018-08-21: 11.13 via INTRAVENOUS

## 2018-08-21 MED ORDER — REGADENOSON 0.4 MG/5ML IV SOLN
0.4000 mg | Freq: Once | INTRAVENOUS | Status: AC
  Administered 2018-08-21: 0.4 mg via INTRAVENOUS

## 2018-08-22 ENCOUNTER — Telehealth: Payer: Self-pay

## 2018-08-22 NOTE — Telephone Encounter (Signed)
-----   Message from Theora Gianotti, NP sent at 08/21/2018  8:52 PM EDT ----- No evidence for poor blood flow to heart muscle.  Great news.  Heart squeezing function read as artifactually low on this study but we know it to be normal based on recent echocardiogram.  As we previously discussed prior episodes of chest pain were atypical, and this study is very reassuring.

## 2018-08-22 NOTE — Telephone Encounter (Signed)
DPR on file. Pt wife made aware of the pt myoview ressults with verbalized understanding.

## 2018-08-29 ENCOUNTER — Other Ambulatory Visit: Payer: PPO

## 2018-08-30 ENCOUNTER — Other Ambulatory Visit: Payer: Self-pay | Admitting: Interventional Cardiology

## 2018-09-08 ENCOUNTER — Other Ambulatory Visit: Payer: Self-pay | Admitting: Emergency Medicine

## 2018-09-08 ENCOUNTER — Other Ambulatory Visit: Payer: Self-pay | Admitting: Cardiovascular Disease

## 2018-09-08 ENCOUNTER — Other Ambulatory Visit: Payer: Self-pay

## 2018-09-08 NOTE — Telephone Encounter (Signed)
New Message    *STAT* If patient is at the pharmacy, call can be transferred to refill team.   1. Which medications need to be refilled? (please list name of each medication and dose if known) furosemide (LASIX) 20 MG tablet    2. Which pharmacy/location (including street and city if local pharmacy) is medication to be sent to? [facility] # [place], [place]  3. Do they need a 30 day or 90 day supply? [place]

## 2018-09-25 ENCOUNTER — Telehealth: Payer: Self-pay

## 2018-09-25 NOTE — Telephone Encounter (Signed)
Patient and patients wife are refusing telehealth visit.  Patients wife is addiment about patient being seen in office.  At last office visit with Ignacia Bayley NP patient was having significant issues.  Will route to Dr. Rockey Situ for recommendations.

## 2018-09-25 NOTE — Telephone Encounter (Signed)
Spoke with patient's wife to reschedule 10/20/18 face to face visit with Dr. Rockey Situ to a telephone or video visit due to current clinic policies related to COVID 19 precautions. Wife was very adamant that she would like for patient to be seen in the office because he was having significant issues during his last visit with Ignacia Bayley, NP and she would like for him to be physically re-examined.

## 2018-09-30 NOTE — Telephone Encounter (Signed)
Spoke with patient and patients wife.  Reviewed Dr. Gwenyth Ober recommendations with them.  They were agreeable to cancel appointment and place a recall for August.  They stated if any symptoms arise they will give our office a call for a telehealth visit.  Will cancel appointment and place a recall for 01/10/2019

## 2018-09-30 NOTE — Telephone Encounter (Signed)
Recently seen by our office 1 month ago Relatively stable at that time good echocardiogram Much of his issues could be adjusted over the phone with a telemetry visit We can put off a repeat visit until office is open again after viral outbreak If he has an issue before then he can call us for medication adjustment or daily visit Could delay follow-up appointment 3 months

## 2018-10-14 MED ORDER — PANTOPRAZOLE SODIUM 40 MG PO TBEC: DELAYED_RELEASE_TABLET | ORAL | 3 refills | Status: DC

## 2018-10-14 MED ORDER — BUDESONIDE-FORMOTEROL FUMARATE 80-4.5 MCG/ACT IN AERO: 2.0000 | INHALATION_SPRAY | Freq: Two times a day (BID) | RESPIRATORY_TRACT | 3 refills | Status: DC

## 2018-10-16 ENCOUNTER — Telehealth: Payer: Self-pay | Admitting: Emergency Medicine

## 2018-10-16 MED ORDER — TIOTROPIUM BROMIDE-OLODATEROL 2.5-2.5 MCG/ACT IN AERS: 2.0000 | INHALATION_SPRAY | Freq: Every day | RESPIRATORY_TRACT | 1 refills | Status: DC

## 2018-10-16 NOTE — Addendum Note (Signed)
Addended by: Jannette Spanner on: 10/16/2018 05:18 PM   Modules accepted: Orders

## 2018-10-16 NOTE — Telephone Encounter (Signed)
Called [facility] Not sure why Stiolto is not in chart but Symbicort is. Stiolto has been mentioned in chart for past year.

## 2018-10-17 NOTE — Telephone Encounter (Signed)
This was sent in yesterday and there is a Pharmacist, community message on this as well. I responded back to the pt via mychart. Will close this encounter.

## 2018-10-20 ENCOUNTER — Ambulatory Visit: Payer: PPO | Admitting: Cardiovascular Disease

## 2018-10-23 ENCOUNTER — Telehealth: Payer: Self-pay | Admitting: Emergency Medicine

## 2018-10-23 NOTE — Telephone Encounter (Signed)
Left message for Eduardo [name] to call back. Per patient's chart, a refill was sent over to [facility] on 10/16/18.

## 2018-10-24 ENCOUNTER — Telehealth: Payer: Self-pay | Admitting: Emergency Medicine

## 2018-10-24 NOTE — Telephone Encounter (Signed)
Called again, call went straight to VM. Left a message for her to call back.

## 2018-10-24 NOTE — Telephone Encounter (Signed)
Spoke with Hydrologist at [facility]. He stated that the Stiolto needed a prior authorization. When attempting to do the PA, he stated that another PA was on file and Josph Macho was still processing the first one. He stated that he will check to verify this and call back.

## 2018-10-27 NOTE — Telephone Encounter (Signed)
Pt calling back about the medication and wants samples or give the info to the insurances company so that he can get his medication.. please call him back today if possible

## 2018-10-27 NOTE — Telephone Encounter (Signed)
LVM for patient to return call back. X1

## 2018-10-27 NOTE — Telephone Encounter (Signed)
LVM for patient to return call back to office. X3

## 2018-10-27 NOTE — Telephone Encounter (Signed)
  I have been trying the get Stiolto Respimat refilled since my request of 5/4. I still waiting since the insurance does not have it on the the list of preferred drugs and require a letter autherizing this drug to be used. I had finished with the last  dose last Thursday  5/14. It should be noted that a prescription was sent  , in error, for Symbicort, by the nurse.  The nurse checked back with Dr. Lamonte Sakai and he does want me to use Stiolto not Symbicort.  My wife picked up the prescription and I opened one inhaler before I realized the error. [facility] did allow me to return the other two inhalers and credited the insurance.   I look forward to having this mix-up rectified as soon as possible.   Please keep me informed.  Any future phone calls should be made to [phone] NOT [phone].   [employer] Member ID #[id number]. Thank-you for your prompt attention to this matter. Eduardo [name] - 19-Jan-2031

## 2018-10-27 NOTE — Telephone Encounter (Signed)
Pt/'s wife is calling Cherina P back. Please call her on her cell phone since the house phone does not seem to be ringing right when you call her back. Tina's cell phone [phone]

## 2018-10-28 ENCOUNTER — Telehealth: Payer: Self-pay | Admitting: Emergency Medicine

## 2018-10-28 NOTE — Telephone Encounter (Signed)
rec'd email below from patient  I have been trying the get Stiolto Respimat refilled since my request of 5/4. I still waiting since the insurance does not have it on the the list of preferred drugs and require a letter autherizing this drug to be used. I had finished with the last  dose last Thursday  5/14. It should be noted that a prescription was sent  , in error, for Symbicort, by the nurse.  The nurse checked back with Dr. Lamonte Sakai and he does want me to use Stiolto not Symbicort.  My wife picked up the prescription and I opened one inhaler before I realized the error. [facility] did allow me to return the other two inhalers and credited the insurance.   I look forward to having this mix-up rectified as soon as possible.   Please keep me informed.  Any future phone calls should be made to [phone] NOT [phone].   [employer] Member ID #[id number]. Thank-you for your prompt attention to this matter. Eduardo [name] - 2031/06/06

## 2018-10-28 NOTE — Telephone Encounter (Signed)
LVM on general VM box for [facility] pharmacy at [phone] this morning. X1 Need to see what is going on with pt's script for stiolto inhalers.

## 2018-10-29 NOTE — Telephone Encounter (Addendum)
rec'd the PA today from [place] needed to be signed by RB The form has been signed by RB today  Called and spoke with patient regarding the RX info and the status of PA Advised pt that the fax has been sent back to [facility] and they are aware of the fax Advised they will receive a call from the pharmacy once medication is ready for pick up Nothing further needed at this time

## 2018-10-29 NOTE — Telephone Encounter (Signed)
We have attempted to contact pt several times with no success or call back from pt. Per triage protocol, message will be closed.  

## 2018-10-30 NOTE — Telephone Encounter (Signed)
Called [facility] and spoke with Cherise to see if PA form was received and if they had a status update for [place]. Per [place], PA was completed and approved effective from 10/29/2018-06/11/2019.  Called pt's pharmacy and spoke with the pharmacy and they were able to run the PA and are going to be able to get medication ready for pt but it probably won't be ready until tomorrow for pickup.  Called pt and spoke with his wife Eduardo [name] letting her know that the PA was approved and the pharmacy is working on getting the [employer] [facility] filled and stated to her that they said he should be able to pick up Rx tomorrow and Eduardo [name] verbalized understanding. Nothing further needed.

## 2018-11-19 NOTE — Telephone Encounter (Signed)
Please set them up for a virtual visit later this week

## 2018-11-21 ENCOUNTER — Encounter: Payer: Self-pay | Admitting: Internal Medicine

## 2018-11-21 ENCOUNTER — Ambulatory Visit (INDEPENDENT_AMBULATORY_CARE_PROVIDER_SITE_OTHER): Payer: PPO | Admitting: Internal Medicine

## 2018-11-21 DIAGNOSIS — R609 Edema, unspecified: Secondary | ICD-10-CM | POA: Diagnosis not present

## 2018-11-21 DIAGNOSIS — I25119 Atherosclerotic heart disease of native coronary artery with unspecified angina pectoris: Secondary | ICD-10-CM | POA: Diagnosis not present

## 2018-11-21 NOTE — Assessment & Plan Note (Signed)
Nothing to suggest exacerbation of CHF or DVT Clearly seems to be mild venous insufficiency Okay to try the 40mg  of lasix for a few more days--but if no effect, go back to 20. If has plans for prolonged standing or sitting--should use the compression hose

## 2018-11-21 NOTE — Progress Notes (Signed)
Subjective:    Patient ID: Eduardo [name], male    DOB: 24-Jul-1930, 85 y.o.   MRN: [id number]  HPI Virtual visit due to concern about foot/leg swelling Identification done Reviewed billing and he gave consent He is in his home and I am in my office His wife is there also  His feet are both swollen--but more so on the left Has noticed it about a month Careful to avoid salt No injury to feet or legs Both legs are "almost normal" in the morning---then start getting fluid in them as the day goes on Has compression socks--but too hot No pain---even when swollen Tried doubling the lasix to 40 daily---no clear change  Current Outpatient Medications on File Prior to Visit  Medication Sig Dispense Refill  . acetaminophen (TYLENOL) 650 MG CR tablet Take 1,300 mg by mouth 2 (two) times daily.    Marland Kitchen albuterol (PROAIR HFA) 108 (90 Base) MCG/ACT inhaler Inhale 2 puffs into the lungs every 6 (six) hours as needed for wheezing or shortness of breath. 1 Inhaler 5  . albuterol (PROVENTIL) (2.5 MG/3ML) 0.083% nebulizer solution use 1 vial in nebulizer every 4 hours as needed for wheezing or shortness of breath 270 mL 2  . ALPRAZolam (XANAX) 0.25 MG tablet Take 1 tablet (0.25 mg total) by mouth 2 (two) times daily as needed for anxiety. 20 tablet 0  . apixaban (ELIQUIS) 5 MG TABS tablet Take 1 tablet (5 mg total) by mouth 2 (two) times daily. 180 tablet 3  . atorvastatin (LIPITOR) 80 MG tablet TAKE 1 TABLET BY MOUTH ONCE DAILY 90 tablet 2  . B Complex-C (SUPER B COMPLEX PO) Take 1 tablet by mouth at bedtime.     . cetirizine (ZYRTEC) 10 MG tablet Take 10 mg by mouth at bedtime.     . cholecalciferol (VITAMIN D) 1000 units tablet Take 1,000 Units by mouth daily.    Marland Kitchen escitalopram (LEXAPRO) 20 MG tablet Take 20 mg by mouth at bedtime.     . fenofibrate 160 MG tablet TAKE ONE TABLET BY MOUTH AT BEDTIME  90 tablet 3  . furosemide (LASIX) 20 MG tablet take 1 tablet by mouth daily (Patient taking  differently: 40 mg. ) 90 tablet 3  . hydrALAZINE (APRESOLINE) 10 MG tablet Take 1 tablet (10 mg total) by mouth 3 (three) times daily. 270 tablet 3  . ipratropium (ATROVENT) 0.03 % nasal spray PLACE 2 SPRAYS INTO BOTH NOSTRILS AT BEDTIME 30 mL 2  . lamoTRIgine (LAMICTAL) 150 MG tablet Take 150 mg by mouth at bedtime. Reported on 08/14/2015    . montelukast (SINGULAIR) 10 MG tablet Take 1 tablet (10 mg total) by mouth at bedtime. 90 tablet 3  . Multiple Vitamin (MULTIVITAMIN) tablet Take 1 tablet by mouth at bedtime.     . pantoprazole (PROTONIX) 40 MG tablet TAKE 1 TABLET BY MOUTH DAILY BEFORE BREAKFAST. 90 tablet 3  . Tiotropium Bromide-Olodaterol (STIOLTO RESPIMAT) 2.5-2.5 MCG/ACT AERS Inhale 2 puffs into the lungs daily. 3 Inhaler 1  . triamcinolone cream (KENALOG) 0.1 % Apply 1 application topically 2 (two) times daily as needed (for skin).     . verapamil (CALAN-SR) 240 MG CR tablet Take 1 tablet (240 mg total) by mouth at bedtime. 90 tablet 2  . nitroGLYCERIN (NITROSTAT) 0.4 MG SL tablet Place 1 tablet (0.4 mg total) under the tongue every 5 (five) minutes as needed for chest pain. 90 tablet 3   No current facility-administered medications on file prior to  visit.     Allergies  Allergen Reactions  . Penicillins Itching    Has patient had a PCN reaction causing immediate rash, facial/tongue/throat swelling, SOB or lightheadedness with hypotension: Yes Has patient had a PCN reaction causing severe rash involving mucus membranes or skin necrosis: No Has patient had a PCN reaction that required hospitalization No Has patient had a PCN reaction occurring within the last 10 years: No If all of the above answers are "NO", then may proceed with Cephalosporin use.   . Sulfonamide Derivatives Other (See Comments)    "it affected my kidneys"  . Tape Other (See Comments)    Arms black and blue, tears skin.  Please use "paper" tape    Past Medical History:  Diagnosis Date  . Allergy   .  Anemia 2012, 08/2015   chronic. Acute due to large hematoma 2013. 2017: due to gastric AVM bleeding.   . Anxiety   . CAD (coronary artery disease)    a. 06/2010 Cath: LM nl, LAD 50p/m, LCX 15m, RCA 50p/m -->catheter dissection but good flow, 70d, RPDA 50-70;  b. 06/2010 Relook cath due to bradycardia and inf ST elev: RCA dissection @ ostium extending into coronary cusp and prox RCA, Ao dissection-->less staining than previously-->Med Rx.  . Chronic combined systolic (congestive) and diastolic (congestive) heart failure ([place])    a. 12/2014 Echo: EF 60-65%; b. 07/2016 Echo: EF 45-50%, mild LVH. Mild AS/MS. Mod-sev MR. Mild to mod TR. PASP 25mmHg; c. 08/2018 Echo: EF 55-60%, RVSP 68.73mmHg. Sev dil LA, mod dil RA. at least mod MR, mild to mod TR. Mild AS.  Marland Kitchen COPD (chronic obstructive pulmonary disease) ([place]) 2013   exertional dyspnea  . Gastric angiodysplasia with hemorrhage 08/2015  . Hx of colonic polyps 2001, 2011   adenomatous 2001. HP 2001, 2011.  Marland Kitchen Hyperlipidemia   . Mitral regurgitation    a. 07/2016 Echo: Mod to sev MR; b. 08/2018 Echo: at least moderate MR.  . Myocardial infarction ([place]) 06/2010.  12/2011.   "twice; 1 day apart; during/after cath". NQMI in setting severe anemia 2013.   Marland Kitchen Neuropathy 2014   in feet, likely due to spinal stenosis, DDD, HNP  . Osteoarthritis 2002   spinal stenosis, spondylolisthesis, disc protrusion multilevel in lumbar spine  . Permanent atrial fibrillation 2012   a.  CHA2DS2VASc = 6-->eliquis;  b. 12/2015 Echo: EF 60-65%, no rwma, LVH, mild AS/MS/MR, sev dil LA, mild TR, PASP 27mmHg.  . Stroke ([place]) 2016  . Upper GI bleed 08/2015   EGD: 2 small gastric AVMs, 1 actively bleeding.  Both treated with APC ablation, clipping of the bleeder    Past Surgical History:  Procedure Laterality Date  . APPENDECTOMY    . CARDIAC CATHETERIZATION  2012   50% LAD and luminal irreg in RCA, 1/12  Post cath MI from damage  . CARPAL TUNNEL RELEASE     left  . CARPAL TUNNEL  RELEASE  10/11/11   Dr Rush Barer  . CATARACT EXTRACTION W/ INTRAOCULAR LENS IMPLANT  2/13   Dr [name]  . COLONOSCOPY  2001, 02/2010   Dr Deatra Ina.   . ESOPHAGOGASTRODUODENOSCOPY N/A 08/15/2015   [name] MD; 2 small gastric AVMs, 1 actively bleeding.  Both treated with APC ablation, clipping of the bleeder  . FOOT FRACTURE SURGERY     right heel repair  . FRACTURE SURGERY    . KNEE ARTHROSCOPY     left, right knee 10/2009  . MASTOIDECTOMY  x3 on right  . NASAL SEPTUM SURGERY     nasal septal repair  . PENILE PROSTHESIS  REMOVAL    . PENILE PROSTHESIS IMPLANT     x 2--got infection after 2nd and had to remove  . PILONIDAL CYST / SINUS EXCISION    . SHOULDER ARTHROSCOPY     right  . TONSILLECTOMY AND ADENOIDECTOMY      Family History  Problem Relation Age of Onset  . Cancer Mother        ? leukemia  . Heart attack Father   . Stroke Maternal Grandmother   . Diabetes Neg Hx   . Hypertension Neg Hx     Social History   Socioeconomic History  . Marital status: Married    Spouse name: Not on file  . Number of children: 4  . Years of education: some college  . Highest education level: Not on file  Occupational History  . Occupation: Retired Chief Strategy Officer the job trainin)  Social Needs  . Financial resource strain: Not on file  . Food insecurity    Worry: Not on file    Inability: Not on file  . Transportation needs    Medical: Not on file    Non-medical: Not on file  Tobacco Use  . Smoking status: Former Smoker    Packs/day: 2.50    Years: 30.00    Pack years: 75.00    Types: Cigarettes    Quit date: 06/11/1978    Years since quitting: 40.4  . Smokeless tobacco: Never Used  Substance and Sexual Activity  . Alcohol use: No    Comment: QUIT 0160 alcoholic  . Drug use: No  . Sexual activity: Not Currently  Lifestyle  . Physical activity    Days per week: Not on file    Minutes per session: Not on file  . Stress: Not on file   Relationships  . Social Herbalist on phone: Not on file    Gets together: Not on file    Attends religious service: Not on file    Active member of club or organization: Not on file    Attends meetings of clubs or organizations: Not on file    Relationship status: Not on file  . Intimate partner violence    Fear of current or ex partner: Not on file    Emotionally abused: Not on file    Physically abused: Not on file    Forced sexual activity: Not on file  Other Topics Concern  . Not on file  Social History Narrative   Grew up in [place], [place].  Lives in [place].   Married---2nd   2 children ([place], [place]  and near [place])--2 stepchildren   Former Smoker--quit in 1980   Alcohol use-no. Quit in 1980--alcoholic      Has living will.   Has designated wife, then step daughter [name] have health care POA.   Would accept resuscitation attempts.   No feeding tube if cognitively unaware.   Review of Systems Weight is stable Appetite is excellent No significant SOB---just stable DOE Sleeps flat in bed. No PND No chest pain No palpitations No dizziness or syncope    Objective:   Physical Exam  Constitutional: He appears well-developed. No distress.  Respiratory: Effort normal. No respiratory distress.  Musculoskeletal:     Comments: 1+ pitting edema on left--trace on right No calf swelling           Assessment &  Plan:

## 2018-11-28 DIAGNOSIS — F411 Generalized anxiety disorder: Secondary | ICD-10-CM | POA: Diagnosis not present

## 2018-11-28 DIAGNOSIS — F39 Unspecified mood [affective] disorder: Secondary | ICD-10-CM | POA: Diagnosis not present

## 2018-11-28 MED ORDER — FUROSEMIDE 40 MG PO TABS: 40.0000 mg | ORAL_TABLET | Freq: Every day | ORAL | 3 refills | Status: DC

## 2018-12-02 ENCOUNTER — Ambulatory Visit: Payer: PPO | Admitting: Emergency Medicine

## 2018-12-06 ENCOUNTER — Other Ambulatory Visit: Payer: Self-pay | Admitting: Pulmonary Disease

## 2018-12-17 ENCOUNTER — Telehealth: Payer: Self-pay | Admitting: Internal Medicine

## 2018-12-17 DIAGNOSIS — I5032 Chronic diastolic (congestive) heart failure: Secondary | ICD-10-CM

## 2018-12-17 NOTE — Telephone Encounter (Signed)
Debbie@[facility] called.  Is pt to have cardiac rehab@[facility]  or physical therapy?  If cardiac, will need referral type changed.  If physical therapy, will need diagnosis changed as they can't use that diagnosis.

## 2018-12-18 NOTE — Telephone Encounter (Signed)
Notified Debbie at [facility] about referral change from PT to cardiac rehab.  PT referral canceled.

## 2018-12-18 NOTE — Telephone Encounter (Signed)
Please let her know that I changed the order to cardiac rehab---I think he is deconditioned and this would be better for him and improving symptoms

## 2018-12-18 NOTE — Telephone Encounter (Signed)
Routing to [employer]

## 2018-12-19 ENCOUNTER — Other Ambulatory Visit: Payer: Self-pay | Admitting: Internal Medicine

## 2018-12-19 DIAGNOSIS — R531 Weakness: Secondary | ICD-10-CM

## 2018-12-19 NOTE — Progress Notes (Signed)
Pt

## 2018-12-31 ENCOUNTER — Other Ambulatory Visit: Payer: Self-pay

## 2018-12-31 ENCOUNTER — Encounter: Payer: Self-pay | Admitting: Physical Therapy

## 2018-12-31 ENCOUNTER — Ambulatory Visit: Payer: PPO | Attending: Internal Medicine | Admitting: Physical Therapy

## 2018-12-31 DIAGNOSIS — R262 Difficulty in walking, not elsewhere classified: Secondary | ICD-10-CM | POA: Insufficient documentation

## 2018-12-31 DIAGNOSIS — M6281 Muscle weakness (generalized): Secondary | ICD-10-CM | POA: Diagnosis not present

## 2018-12-31 NOTE — Therapy (Addendum)
[name] [address] [name], [place], [address] Phone: [phone]   Fax:  [phone]  Physical Therapy Evaluation  Patient Details  Name: [name] MRN: [id number] Date of Birth: 09-28-1930 Referring Provider (PT): [name]   Encounter Date: 12/31/2018  PT End of Session - 12/31/18 0917    Visit Number  1    Number of Visits  17    Date for PT Re-Evaluation  02/25/19    PT Start Time  0900    PT Stop Time  1000    PT Time Calculation (min)  60 min    Equipment Utilized During Treatment  Gait belt    Activity Tolerance  Patient tolerated treatment well    Behavior During Therapy  [facility] for tasks assessed/performed       Past Medical History:  Diagnosis Date  . Allergy   . Anemia 2012, 08/2015   chronic. Acute due to large hematoma 2013. 2017: due to gastric AVM bleeding.   . Anxiety   . CAD (coronary artery disease)    a. 06/2010 Cath: LM nl, LAD 50p/m, LCX 14m, RCA 50p/m -->catheter dissection but good flow, 70d, RPDA 50-70;  b. 06/2010 Relook cath due to bradycardia and inf ST elev: RCA dissection @ ostium extending into coronary cusp and prox RCA, Ao dissection-->less staining than previously-->Med Rx.  . Chronic combined systolic (congestive) and diastolic (congestive) heart failure ([name])    a. 12/2014 Echo: EF 60-65%; b. 07/2016 Echo: EF 45-50%, mild LVH. Mild AS/MS. Mod-sev MR. Mild to mod TR. PASP 41mmHg; c. 08/2018 Echo: EF 55-60%, RVSP 68.62mmHg. Sev dil LA, mod dil RA. at least mod MR, mild to mod TR. Mild AS.  Marland Kitchen COPD (chronic obstructive pulmonary disease) ([name]) 2013   exertional dyspnea  . Gastric angiodysplasia with hemorrhage 08/2015  . Hx of colonic polyps 2001, 2011   adenomatous 2001. HP 2001, 2011.  Marland Kitchen Hyperlipidemia   . Mitral regurgitation    a. 07/2016 Echo: Mod to sev MR; b. 08/2018 Echo: at least moderate MR.  . Myocardial infarction ([name]) 06/2010.  12/2011.   "twice; 1 day apart;  during/after cath". NQMI in setting severe anemia 2013.   [facility] Neuropathy 2014   in feet, likely due to spinal stenosis, DDD, HNP  . Osteoarthritis 2002   spinal stenosis, spondylolisthesis, disc protrusion multilevel in lumbar spine  . Permanent atrial fibrillation 2012   a.  CHA2DS2VASc = 6-->eliquis;  b. 12/2015 Echo: EF 60-65%, no rwma, LVH, mild AS/MS/MR, sev dil LA, mild TR, PASP 50mmHg.  . Stroke ([name]) 2016  . Upper GI bleed 08/2015   EGD: 2 small gastric AVMs, 1 actively bleeding.  Both treated with APC ablation, clipping of the bleeder    Past Surgical History:  Procedure Laterality Date  . APPENDECTOMY    . CARDIAC CATHETERIZATION  2012   50% LAD and luminal irreg in RCA, 1/12  Post cath MI from damage  . CARPAL TUNNEL RELEASE     left  . CARPAL TUNNEL RELEASE  10/11/11   Dr Rush Barer  . CATARACT EXTRACTION W/ INTRAOCULAR LENS IMPLANT  2/13   Dr [name]  . COLONOSCOPY  2001, 02/2010   Dr Deatra Ina.   . ESOPHAGOGASTRODUODENOSCOPY N/A 08/15/2015   [name] MD; 2 small gastric AVMs, 1 actively bleeding.  Both treated with APC ablation, clipping of the bleeder  . FOOT FRACTURE SURGERY     right heel repair  . FRACTURE  SURGERY    . KNEE ARTHROSCOPY     left, right knee 10/2009  . MASTOIDECTOMY     x3 on right  . NASAL SEPTUM SURGERY     nasal septal repair  . PENILE PROSTHESIS  REMOVAL    . PENILE PROSTHESIS IMPLANT     x 2--got infection after 2nd and had to remove  . PILONIDAL CYST / SINUS EXCISION    . SHOULDER ARTHROSCOPY     right  . TONSILLECTOMY AND ADENOIDECTOMY      There were no vitals filed for this visit.   Subjective Assessment - 12/31/18 0907    Subjective  Patient feels unbalanced.    Patient is accompained by:  Family member    Pertinent History  Patinet has been using a rollator for a year and 1/2, and a cane for 4 years before that. He has been having PT for the last 4 years. He has had several falls.    How long can you stand  comfortably?  15 mins    How long can you walk comfortably?  150 feet    Patient Stated Goals  Patient  wants to be stronger to be abel to stand and do activiites.    Currently in Pain?  Yes    Pain Score  5     Pain Location  Heel    Pain Orientation  Left    Pain Descriptors / Indicators  Aching    Pain Onset  More than a month ago    Pain Frequency  Intermittent    Multiple Pain Sites  No         [facility] PT Assessment - 12/31/18 0911      Assessment   Medical Diagnosis  weakness    Referring Provider (PT)  [name], Richard    Onset Date/Surgical Date  12/19/18    Hand Dominance  Right      Precautions   Precautions  Fall      Balance Screen   Has the patient fallen in the past 6 months  No    Has the patient had a decrease in activity level because of a fear of falling?   Yes    Is the patient reluctant to leave their home because of a fear of falling?   No      Home Film/video editor residence    Living Arrangements  Spouse/significant other    Available Help at Discharge  Family    Type of B and E to enter    Entrance Stairs-Number of Steps  3    Entrance Stairs-Rails  Right    Home Layout  Two level    Alternate Level Stairs-Number of Steps  [place] - 4 wheels      Prior Function   Level of Independence  Independent with basic ADLs    Vocation  Retired    Leisure  wood work      Charity fundraiser Status  Within Advertising copywriter for tasks assessed         PAIN: L heel pain intermittent with DF   POSTURE: WNL   PROM/AROM:WFL BUE and BLE  STRENGTH:  Graded on a 0-5 scale Muscle Group Left Right                          Hip  Flex 5/5 4/5  Hip Abd 5/5 4/5  Hip Add -3/5 2/5  Hip Ext 2/5 2/5      Knee Flex 5/5 5/5  Knee Ext 5/5 5/5  Ankle DF 3/5 3/5  Ankle PF 0/5 0/5   SENSATION:  WNL BLE  FUNCTIONAL MOBILITY: Transfers with use of UE's  BALANCE:  Static  Standing Balance  Normal Able to maintain standing balance against maximal resistance   Good Able to maintain standing balance against moderate resistance   Good-/Fair+ Able to maintain standing balance against minimal resistance   Fair Able to stand unsupported without UE support and without LOB for 1-2 min x  Fair- Requires Min A and UE support to maintain standing without loss of balance   Poor+ Requires mod A and UE support to maintain standing without loss of balance   Poor Requires max A and UE support to maintain standing balance without loss    Standing Dynamic Balance  Normal Stand independently unsupported, able to weight shift and cross midline maximally   Good Stand independently unsupported, able to weight shift and cross midline moderately x  Good-/Fair+ Stand independently unsupported, able to weight shift across midline minimally   Fair Stand independently unsupported, weight shift, and reach ipsilaterally, loss of balance when crossing midline   Poor+ Able to stand with Min A and reach ipsilaterally, unable to weight shift   Poor Able to stand with Mod A and minimally reach ipsilaterally, unable to cross midline.       GAIT: Ambulates with rollator with RLE foot slap and intermediate distances   OUTCOME MEASURES: TEST Outcome Interpretation  5 times sit<>stand 16.07sec >75 yo, >15 sec indicates increased risk for falls  10 meter walk test         . 77        m/s <1.0 m/s indicates increased risk for falls; limited community ambulator  Timed up and Go    12.64             sec <14 sec indicates increased risk for falls  6 minute walk test      320          Feet 1000 feet is community ambulator            Treatment:  Sit to stand x 10 , knees begin to buckle Standing hip extension x 10 BLE  Patient needs cues for safety and correct posture     Plan - 01/06/19 1235    Clinical Impression Statement  Patient presents to PT eval with B ankle weakness DF/PF, hip ext  weakness and RLE hip weakness, balance deficit in static and dynamic standning and hx of falls. He has poor standing tolerance and after several minutes his knees bergin to buckle. He will benefit from skilled PT to improve strength and mobiltiy    Personal Factors and Comorbidities  Age    Examination-Activity Limitations  Locomotion Level;Stand;Transfers    Examination-Participation Restrictions  Driving;Community Activity    Stability/Clinical Decision Making  Stable/Uncomplicated    Rehab Potential  Good    PT Frequency  2x / week    PT Duration  8 weeks    PT Treatment/Interventions  Therapeutic exercise;Therapeutic activities;Balance training;Neuromuscular re-education;Patient/family education;Manual techniques    PT Next Visit Plan  balance and therapeutic exericse    Consulted and Agree with Plan of Care  Patient;Family member/caregiver         Objective measurements completed on examination: See above findings.  PT Education - 12/31/18 0916    Education Details  plan of care    Person(s) Educated  Patient    Methods  Explanation    Comprehension  Verbalized understanding       PT Short Term Goals - 12/31/18 1001      PT SHORT TERM GOAL #1   Title  Patient will be independent in home exercise program to improve strength/mobility for better functional independence with ADLs.    Time  4    Period  Weeks    Status  New    Target Date  01/29/19      PT SHORT TERM GOAL #2   Title  Patient (> 71 years old) will complete five times sit to stand test in < 15 seconds indicating an increased LE strength and improved balance.    Time  4    Period  Weeks    Status  New    Target Date  01/29/19        PT Long Term Goals - 12/31/18 1003      PT LONG TERM GOAL #1   Title  Patient will increase 10 meter walk test to >1.47m/s as to improve gait speed for better community ambulation and to reduce fall risk.    Time  8    Period  Weeks    Status  New     Target Date  02/26/19      PT LONG TERM GOAL #2   Title  Patient will reduce timed up and go to <11 seconds to reduce fall risk and demonstrate improved transfer/gait ability.    Time  8    Period  Weeks    Status  New    Target Date  02/26/19      PT LONG TERM GOAL #3   Title  Patient will increase BLE gross strength to 4+/5 as to improve functional strength for independent gait, increased standing tolerance and increased ADL ability.    Time  8    Period  Weeks    Status  New    Target Date  02/26/19             Plan - 12/31/18 0958    Clinical Impression Statement  Patient presents to PT eval with B ankle weakness DF/PF, hip ext weakness and RLE hip weakness, balance deficit in static and dynamic standning and hx of falls. He has poor standing tolerance and after several minutes his knees bergin to buckle. He will benefit from skilled PT to improve strength and mobiltiy.        Patient will benefit from skilled therapeutic intervention in order to improve the following deficits and impairments:     Visit Diagnosis: 1. Muscle weakness (generalized)   2. Difficulty in walking, not elsewhere classified        Problem List Patient Active Problem List   Diagnosis Date Noted  . Dysuria 12/31/2017  . Constipation 12/31/2017  . CAD (coronary artery disease), native coronary artery 12/28/2017  . Bilateral carotid artery stenosis 12/04/2017  . Laceration of right leg excluding thigh 10/23/2017  . Alcohol dependence in remission ([place]) 06/24/2017  . Imbalance 02/05/2017  . Mitral regurgitation 07/17/2016  . Dyslipidemia 07/14/2016  . Encounter for anticoagulation discussion and counseling 05/03/2015  . Chronic anticoagulation 03/01/2015  . 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 TIA (transient ischemic attack) 12/31/2014  . Advanced directives, counseling/discussion 04/19/2014  . Spinal stenosis, lumbar region, with neurogenic claudication  12/23/2012  . Osteoarthritis, multiple sites 10/08/2012  . Preventative health care 02/25/2012  . MI, old with prior NSTEMI x 2 12/28/2011  . Chronic diastolic HF (heart failure) ([place]) 12/28/2011    Class: Acute  . Edema 09/20/2010  . Allergic rhinitis 11/11/2008  . Mood disorder ([place]) 04/14/2008  . Essential hypertension 04/14/2008  . Chronic atrial fibrillation 04/14/2008  . COPD GOLD II B 04/14/2008    Alanson Puls, PT DPT 12/31/2018, 10:06 AM  [place] [facility] [address] [place], [place], [address] Phone: [phone]   Fax:  [phone]  Name: AVRY ROEDL MRN: [id number] Date of Birth: September 28, 1930

## 2019-01-05 ENCOUNTER — Encounter: Payer: Self-pay | Admitting: Emergency Medicine

## 2019-01-05 ENCOUNTER — Ambulatory Visit: Payer: PPO | Admitting: Emergency Medicine

## 2019-01-05 ENCOUNTER — Other Ambulatory Visit: Payer: Self-pay

## 2019-01-05 DIAGNOSIS — J439 Emphysema, unspecified: Secondary | ICD-10-CM | POA: Diagnosis not present

## 2019-01-05 DIAGNOSIS — J301 Allergic rhinitis due to pollen: Secondary | ICD-10-CM | POA: Diagnosis not present

## 2019-01-05 MED ORDER — ALBUTEROL SULFATE HFA 108 (90 BASE) MCG/ACT IN AERS: 2.0000 | INHALATION_SPRAY | Freq: Four times a day (QID) | RESPIRATORY_TRACT | 5 refills | Status: DC | PRN

## 2019-01-05 NOTE — Patient Instructions (Signed)
Please continue Stiolto 2 puffs once daily as you have been taking it. Keep albuterol available to use 2 puffs or 1 nebulizer treatment up to every 4 hours if needed for shortness of breath, chest tightness, wheezing.  We will refill your albuterol nebulizer treatments today. Agree with going to rehab, physical therapy.  Keep up the good work. You will need a flu shot in the fall Continue your Zyrtec and Atrovent nasal spray as you have been using them. You can use Mucinex-DM as needed for increased congestion Follow with Dr Lamonte Sakai in 4 months or sooner if you have any problems.

## 2019-01-05 NOTE — Progress Notes (Signed)
  Subjective:    Patient ID: GILMORE LIST, male    DOB: 1931-02-07, 85 y.o.   MRN: [id number] HPI  ROV 08/04/18 --85 year old gentleman, former smoker with CAD and atrial fibrillation.  Also COPD with upper airway instability and allergic rhinitis.  We have been managing him on Stiolto, Zyrtec, Atrovent nasal spray.  He began to have some difficulty earlier this month, describes increased cough with brownish mucous, orthopnea, increased LE edema.  He has been treated with prednisone, but then showed evidence of increased edema and continued dyspnea.  His Lasix was therefore increased from 20 to 40 mg and azithromycin was added.   ROV 01/05/2019 --Mr. Rafalski is 85, has history of tobacco, coronary disease, atrial fibrillation.  We follow him for COPD as well as upper airway instability and cough in the setting of allergic rhinitis.  His most recent flare was in February, was treated with prednisone and azithromycin.  He has been chronically managed with Stiolto, has albuterol available to use at approximately once a day. He wants to have albuterol nebs available - has had difficulty getting it. Needs a new script.   He uses Zyrtec and Atrovent nasal spray, mucinex-DM. He is about to start rehab / PT for strength building. His activity level is down due to the isolation efforts. He has dyspnea with bending over, with heavier exertion like climbing 2 flights of stairs.     Review of Systems As per HPI    Objective:  Physical Exam Vitals:   01/05/19 1005  BP: 132/66  Pulse: 74  SpO2: 96%  Weight: 146 lb (66.2 kg)  Height: 5\' 2"  (1.575 m)   Gen: Pleasant, well-nourished elderly man, in no distress,  normal affect  ENT: No lesions,  mouth clear,  oropharynx clear, no postnasal drip  Neck: No JVD, no stridor today  Lungs: No use of accessory muscles, distant, no wheeze  Cardiovascular: irregular, heart sounds normal, no murmur or gallops, no peripheral edema  Musculoskeletal: No  deformities, no cyanosis or clubbing  Neuro: alert, non focal  Skin: Warm, no lesions or rashes   Assessment & Plan:  COPD GOLD II B Please continue Stiolto 2 puffs once daily as you have been taking it. Keep albuterol available to use 2 puffs or 1 nebulizer treatment up to every 4 hours if needed for shortness of breath, chest tightness, wheezing.  We will refill your albuterol nebulizer treatments today. Agree with going to rehab, physical therapy.  Keep up the good work. You will need a flu shot in the fall You can use Mucinex-DM as needed for increased congestion Follow with Dr Lamonte Sakai in 4 months or sooner if you have any problems  Allergic rhinitis Continue your Zyrtec and Atrovent nasal spray as you have been using them. You can use Mucinex-DM as needed for increased congestion  Baltazar Apo, MD, PhD 01/05/2019, 10:20 AM [facility] [phone] or if no answer [phone]

## 2019-01-05 NOTE — Assessment & Plan Note (Signed)
Please continue Stiolto 2 puffs once daily as you have been taking it. Keep albuterol available to use 2 puffs or 1 nebulizer treatment up to every 4 hours if needed for shortness of breath, chest tightness, wheezing.  We will refill your albuterol nebulizer treatments today. Agree with going to rehab, physical therapy.  Keep up the good work. You will need a flu shot in the fall You can use Mucinex-DM as needed for increased congestion Follow with Dr Lamonte Sakai in 4 months or sooner if you have any problems

## 2019-01-05 NOTE — Assessment & Plan Note (Signed)
Continue your Zyrtec and Atrovent nasal spray as you have been using them. You can use Mucinex-DM as needed for increased congestion

## 2019-01-06 ENCOUNTER — Encounter: Payer: Self-pay | Admitting: Physical Therapy

## 2019-01-06 ENCOUNTER — Ambulatory Visit: Payer: PPO | Admitting: Physical Therapy

## 2019-01-06 DIAGNOSIS — M6281 Muscle weakness (generalized): Secondary | ICD-10-CM | POA: Diagnosis not present

## 2019-01-06 DIAGNOSIS — R262 Difficulty in walking, not elsewhere classified: Secondary | ICD-10-CM

## 2019-01-06 NOTE — Therapy (Signed)
[place] MAIN [facility] SERVICES [address] [place], [place], [address] Phone: [phone]   Fax:  [phone]  Physical Therapy Treatment  Patient Details  Name: Eduardo [name] MRN: [id number] Date of Birth: 1931/02/20 Referring Provider (PT): [name]   Encounter Date: 01/06/2019  PT End of Session - 01/06/19 1108    Visit Number  2    Number of Visits  17    Date for PT Re-Evaluation  02/25/19    PT Start Time  1102    PT Stop Time  1145    PT Time Calculation (min)  43 min    Equipment Utilized During Treatment  Gait belt    Activity Tolerance  Patient tolerated treatment well    Behavior During Therapy  [facility] for tasks assessed/performed       Past Medical History:  Diagnosis Date  . Allergy   . Anemia 2012, 08/2015   chronic. Acute due to large hematoma 2013. 2017: due to gastric AVM bleeding.   . Anxiety   . CAD (coronary artery disease)    a. 06/2010 Cath: LM nl, LAD 50p/m, LCX 3m, RCA 50p/m -->catheter dissection but good flow, 70d, RPDA 50-70;  b. 06/2010 Relook cath due to bradycardia and inf ST elev: RCA dissection @ ostium extending into coronary cusp and prox RCA, Ao dissection-->less staining than previously-->Med Rx.  . Chronic combined systolic (congestive) and diastolic (congestive) heart failure ([place])    a. 12/2014 Echo: EF 60-65%; b. 07/2016 Echo: EF 45-50%, mild LVH. Mild AS/MS. Mod-sev MR. Mild to mod TR. PASP 80mmHg; c. 08/2018 Echo: EF 55-60%, RVSP 68.61mmHg. Sev dil LA, mod dil RA. at least mod MR, mild to mod TR. Mild AS.  Marland Kitchen COPD (chronic obstructive pulmonary disease) ([place]) 2013   exertional dyspnea  . Gastric angiodysplasia with hemorrhage 08/2015  . Hx of colonic polyps 2001, 2011   adenomatous 2001. HP 2001, 2011.  Marland Kitchen Hyperlipidemia   . Mitral regurgitation    a. 07/2016 Echo: Mod to sev MR; b. 08/2018 Echo: at least moderate MR.  . Myocardial infarction ([place]) 06/2010.  12/2011.   "twice; 1 day apart;  during/after cath". NQMI in setting severe anemia 2013.   Marland Kitchen Neuropathy 2014   in feet, likely due to spinal stenosis, DDD, HNP  . Osteoarthritis 2002   spinal stenosis, spondylolisthesis, disc protrusion multilevel in lumbar spine  . Permanent atrial fibrillation 2012   a.  CHA2DS2VASc = 6-->eliquis;  b. 12/2015 Echo: EF 60-65%, no rwma, LVH, mild AS/MS/MR, sev dil LA, mild TR, PASP 27mmHg.  . Stroke ([place]) 2016  . Upper GI bleed 08/2015   EGD: 2 small gastric AVMs, 1 actively bleeding.  Both treated with APC ablation, clipping of the bleeder    Past Surgical History:  Procedure Laterality Date  . APPENDECTOMY    . CARDIAC CATHETERIZATION  2012   50% LAD and luminal irreg in RCA, 1/12  Post cath MI from damage  . CARPAL TUNNEL RELEASE     left  . CARPAL TUNNEL RELEASE  10/11/11   Dr Rush Barer  . CATARACT EXTRACTION W/ INTRAOCULAR LENS IMPLANT  2/13   Dr [name]  . COLONOSCOPY  2001, 02/2010   Dr Deatra Ina.   . ESOPHAGOGASTRODUODENOSCOPY N/A 08/15/2015   [name] MD; 2 small gastric AVMs, 1 actively bleeding.  Both treated with APC ablation, clipping of the bleeder  . FOOT FRACTURE SURGERY     right heel repair  . FRACTURE  SURGERY    . KNEE ARTHROSCOPY     left, right knee 10/2009  . MASTOIDECTOMY     x3 on right  . NASAL SEPTUM SURGERY     nasal septal repair  . PENILE PROSTHESIS  REMOVAL    . PENILE PROSTHESIS IMPLANT     x 2--got infection after 2nd and had to remove  . PILONIDAL CYST / SINUS EXCISION    . SHOULDER ARTHROSCOPY     right  . TONSILLECTOMY AND ADENOIDECTOMY      There were no vitals filed for this visit.  Subjective Assessment - 01/06/19 1108    Subjective  Patient reports feeling well, denies any pain; Reports mixed adherence with HEP stating that he is not always motivated to do the exercise.    Patient is accompained by:  Family member    Pertinent History  Patinet has been using a rollator for a year and 1/2, and a cane for 4 years before  that. He has been having PT for the last 4 years. He has had several falls.    How long can you stand comfortably?  15 mins    How long can you walk comfortably?  150 feet    Patient Stated Goals  Patient  wants to be stronger to be abel to stand and do activiites.    Currently in Pain?  No/denies    Pain Onset  More than a month ago    Multiple Pain Sites  No          TREATMENT: Warm up on Nustep BUE/BLE level 2 x5 min with HEP instruction;  PT advanced HEP with standing tband exercise, provided written HEP for better adherence: Standing with red tband around BLE: -hip abduction x10 reps bilaterally; -hip extension x10 reps bilaterally; -hip flexion x10 reps bilaterally; -Side stepping x8 feet x3 laps each direction;   Patient required min-moderate verbal/tactile cues for correct exercise technique including to avoid trunk lean and increase core stabilization for less back discomfort and optimum muscle activation;    Required intermittent rail assist for safety;   Mini squats x10 reps with finger tip hold for safety;  Patient unable to complete heel/toe raises due to ankle weakness;    Patient instructed in advanced balance exercise  Standing beside treadmill:   Standing on airex foam: -alternate toe taps to 4 inch step with 1 rail assist x10 reps bilaterally; -Standing one foot on airex, one foot on 4 inch step, unsupported, head turns side/side, up/down x5 reps each, each foot on step with min A for safety; -Alternate march with 1-0 rail assist with min Vcs to increase hip flexion for better strength and balance challenge x10 reps bilaterally -modified tandem stance: ball pass side/side x5 reps each foot in front; Patient required min VCs for balance stability, including to increase trunk control for less loss of balance with smaller base of support   Response to treatment: Patient often becomes short of breath, Vitals monitored, SPO2 96%, HR in high 80's, low 90's;  multiple seated rest breaks offered for patient to catch breath due to dyspnea on exertion; Patient requires min VCs for correct exercise technique for optimum muscle activation and strengthening;  Instructed patient in advanced balance tasks, utilizing compliant surfaces for better balance challenge; Patient does exhibit increased ankle instability especially while standing on compliant surfaces. He was able to reduce rail assist, but does require min A for safety;  PT Education - 01/06/19 1108    Education Details  exercise, HEP reinforced;    Person(s) Educated  Patient    Methods  Explanation;Verbal cues    Comprehension  Verbalized understanding;Returned demonstration;Verbal cues required;Need further instruction       PT Short Term Goals - 12/31/18 1001      PT SHORT TERM GOAL #1   Title  Patient will be independent in home exercise program to improve strength/mobility for better functional independence with ADLs.    Time  4    Period  Weeks    Status  New    Target Date  01/29/19      PT SHORT TERM GOAL #2   Title  Patient (> 71 years old) will complete five times sit to stand test in < 15 seconds indicating an increased LE strength and improved balance.    Time  4    Period  Weeks    Status  New    Target Date  01/29/19        PT Long Term Goals - 12/31/18 1003      PT LONG TERM GOAL #1   Title  Patient will increase 10 meter walk test to >1.33m/s as to improve gait speed for better community ambulation and to reduce fall risk.    Time  8    Period  Weeks    Status  New    Target Date  02/26/19      PT LONG TERM GOAL #2   Title  Patient will reduce timed up and go to <11 seconds to reduce fall risk and demonstrate improved transfer/gait ability.    Time  8    Period  Weeks    Status  New    Target Date  02/26/19      PT LONG TERM GOAL #3   Title  Patient will increase BLE gross strength to 4+/5 as to improve functional strength  for independent gait, increased standing tolerance and increased ADL ability.    Time  8    Period  Weeks    Status  New    Target Date  02/26/19            Plan - 01/06/19 1240    Clinical Impression Statement  Patient instructed in advanced LE strengthening; Advanced HEP with standing tband exercise for better resistance. Provided written HEP for better adherence. Instructed patient in balance exercise. He does have decreased ankle control with instability while standing on compliant surfaces. He would benefit from additional skilled PT Intervention to improve strength, balance and gait safety;    Personal Factors and Comorbidities  Age    Examination-Activity Limitations  Locomotion Level;Stand;Transfers    Examination-Participation Restrictions  Driving;Community Activity    Stability/Clinical Decision Making  Stable/Uncomplicated    Rehab Potential  Good    PT Frequency  2x / week    PT Duration  8 weeks    PT Treatment/Interventions  Therapeutic exercise;Therapeutic activities;Balance training;Neuromuscular re-education;Patient/family education;Manual techniques    PT Next Visit Plan  balance and therapeutic exericse    Consulted and Agree with Plan of Care  Patient;Family member/caregiver       Patient will benefit from skilled therapeutic intervention in order to improve the following deficits and impairments:  Abnormal gait, Decreased activity tolerance, Decreased endurance, Decreased strength, Impaired flexibility, Difficulty walking, Decreased mobility, Cardiopulmonary status limiting activity  Visit Diagnosis: 1. Muscle weakness (generalized)   2. Difficulty in walking, not elsewhere classified  Problem List Patient Active Problem List   Diagnosis Date Noted  . Dysuria 12/31/2017  . Constipation 12/31/2017  . CAD (coronary artery disease), native coronary artery 12/28/2017  . Bilateral carotid artery stenosis 12/04/2017  . Laceration of right leg  excluding thigh 10/23/2017  . Alcohol dependence in remission ([place]) 06/24/2017  . Imbalance 02/05/2017  . Mitral regurgitation 07/17/2016  . Dyslipidemia 07/14/2016  . Encounter for anticoagulation discussion and counseling 05/03/2015  . Chronic anticoagulation 03/01/2015  . 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 TIA (transient ischemic attack) 12/31/2014  . Advanced directives, counseling/discussion 04/19/2014  . Spinal stenosis, lumbar region, with neurogenic claudication 12/23/2012  . Osteoarthritis, multiple sites 10/08/2012  . Preventative health care 02/25/2012  . MI, old with prior NSTEMI x 2 12/28/2011  . Chronic diastolic HF (heart failure) ([place]) 12/28/2011    Class: Acute  . Edema 09/20/2010  . Allergic rhinitis 11/11/2008  . Mood disorder ([place]) 04/14/2008  . Essential hypertension 04/14/2008  . Chronic atrial fibrillation 04/14/2008  . COPD GOLD II B 04/14/2008    Trotter,Margaret PT, DPT 01/06/2019, 12:40 PM  [place] MAIN [facility] SERVICES [address] [place], [place], [address] Phone: [phone]   Fax:  [phone]  Name: Eduardo [name] MRN: [id number] Date of Birth: 01/06/1931

## 2019-01-06 NOTE — Addendum Note (Signed)
Addended by: Alanson Puls on: 01/06/2019 12:39 PM   Modules accepted: Orders

## 2019-01-06 NOTE — Patient Instructions (Addendum)
Balance, Proprioception: Hip Abduction With Tubing   With tubing attached to both ankles, Standing holding onto counter, kick one leg out to side and then Return.  Repeat _10___ times  On each side.  Do ___2_ sessions per day.  [email or web address].[email or web address].[email or web address]    Balance, Proprioception: Hip Extension With Tubing   With tubing tied around both legs, holding onto kitchen counter, swing leg back. Return. Repeat _10___ times . Do __2__ sessions per day.  http://cc.[email or web address].[email or web address]   Copyright  VHI. All rights reserved.  Balance, Proprioception: Hip Flexion With Tubing   With tubing attached to both ankles, swing leg forward. Return. Repeat _10___ times. Do __2__ sessions per day.  http://cc.[email or web address].[email or web address]   Copyright  VHI. All rights reserved.  Band Walk: Side Stepping   Tie band around legs, AROUND ANKLES. Step _10__ feet to one side, then step back to start. Repeat _2-3__ feet per session. Note: Small towel between band and skin eases rubbing.  http://plyo.exer.[email or web address]   Copyright  VHI. All rights reserved.     Toe / Heel Raise    Gently rock back on heels and raise toes. Then rock forward on toes and raise heels. Repeat sequence ____ times per session. Do ____ sessions per week.  Copyright  VHI. All rights reserved.  Mini-Squats (Standing)    Stand with support. Bend knees slightly. Tighten pelvic floor. Hold for _2__ seconds. Return to straight standing. Relax for ___ seconds. Repeat _10__ times. Do _2__ times a day.  Copyright  VHI. All rights reserved.   SIT TO STAND: No Device   Sit with feet shoulder-width apart, on floor.(Make sure that you are in a chair that won't move like a chair against a wall or couch etc) Lean chest forward, raise hips up from surface. Straighten hips and knees. Weight bear equally on left and right sides. 10___ reps per set, _2__ sets per day, _5__ days per week Place left leg closer to sitting surface.

## 2019-01-08 ENCOUNTER — Ambulatory Visit: Payer: PPO | Admitting: Physical Therapy

## 2019-01-13 ENCOUNTER — Ambulatory Visit: Payer: PPO | Attending: Internal Medicine

## 2019-01-13 ENCOUNTER — Other Ambulatory Visit: Payer: Self-pay

## 2019-01-13 DIAGNOSIS — R262 Difficulty in walking, not elsewhere classified: Secondary | ICD-10-CM | POA: Diagnosis not present

## 2019-01-13 DIAGNOSIS — M6281 Muscle weakness (generalized): Secondary | ICD-10-CM | POA: Insufficient documentation

## 2019-01-13 NOTE — Therapy (Signed)
[place] MAIN [facility] SERVICES [address] [place], [place], [address] Phone: [phone]   Fax:  [phone]  Physical Therapy Treatment  Patient Details  Name: Eduardo [name] MRN: [id number] Date of Birth: 05-Dec-1930 Referring Provider (PT): [name]   Encounter Date: 01/13/2019  PT End of Session - 01/13/19 0927    Visit Number  3    Number of Visits  17    Date for PT Re-Evaluation  02/25/19    PT Start Time  0930    PT Stop Time  1015    PT Time Calculation (min)  45 min    Equipment Utilized During Treatment  Gait belt    Activity Tolerance  Patient tolerated treatment well    Behavior During Therapy  [facility] for tasks assessed/performed       Past Medical History:  Diagnosis Date  . Allergy   . Anemia 2012, 08/2015   chronic. Acute due to large hematoma 2013. 2017: due to gastric AVM bleeding.   . Anxiety   . CAD (coronary artery disease)    a. 06/2010 Cath: LM nl, LAD 50p/m, LCX 65m, RCA 50p/m -->catheter dissection but good flow, 70d, RPDA 50-70;  b. 06/2010 Relook cath due to bradycardia and inf ST elev: RCA dissection @ ostium extending into coronary cusp and prox RCA, Ao dissection-->less staining than previously-->Med Rx.  . Chronic combined systolic (congestive) and diastolic (congestive) heart failure ([place])    a. 12/2014 Echo: EF 60-65%; b. 07/2016 Echo: EF 45-50%, mild LVH. Mild AS/MS. Mod-sev MR. Mild to mod TR. PASP 58mmHg; c. 08/2018 Echo: EF 55-60%, RVSP 68.24mmHg. Sev dil LA, mod dil RA. at least mod MR, mild to mod TR. Mild AS.  Marland Kitchen COPD (chronic obstructive pulmonary disease) ([place]) 2013   exertional dyspnea  . Gastric angiodysplasia with hemorrhage 08/2015  . Hx of colonic polyps 2001, 2011   adenomatous 2001. HP 2001, 2011.  Marland Kitchen Hyperlipidemia   . Mitral regurgitation    a. 07/2016 Echo: Mod to sev MR; b. 08/2018 Echo: at least moderate MR.  . Myocardial infarction ([place]) 06/2010.  12/2011.   "twice; 1 day apart;  during/after cath". NQMI in setting severe anemia 2013.   Marland Kitchen Neuropathy 2014   in feet, likely due to spinal stenosis, DDD, HNP  . Osteoarthritis 2002   spinal stenosis, spondylolisthesis, disc protrusion multilevel in lumbar spine  . Permanent atrial fibrillation 2012   a.  CHA2DS2VASc = 6-->eliquis;  b. 12/2015 Echo: EF 60-65%, no rwma, LVH, mild AS/MS/MR, sev dil LA, mild TR, PASP 7mmHg.  . Stroke ([place]) 2016  . Upper GI bleed 08/2015   EGD: 2 small gastric AVMs, 1 actively bleeding.  Both treated with APC ablation, clipping of the bleeder    Past Surgical History:  Procedure Laterality Date  . APPENDECTOMY    . CARDIAC CATHETERIZATION  2012   50% LAD and luminal irreg in RCA, 1/12  Post cath MI from damage  . CARPAL TUNNEL RELEASE     left  . CARPAL TUNNEL RELEASE  10/11/11   Dr Rush Barer  . CATARACT EXTRACTION W/ INTRAOCULAR LENS IMPLANT  2/13   Dr [name]  . COLONOSCOPY  2001, 02/2010   Dr Deatra Ina.   . ESOPHAGOGASTRODUODENOSCOPY N/A 08/15/2015   [name] MD; 2 small gastric AVMs, 1 actively bleeding.  Both treated with APC ablation, clipping of the bleeder  . FOOT FRACTURE SURGERY     right heel repair  . FRACTURE  SURGERY    . KNEE ARTHROSCOPY     left, right knee 10/2009  . MASTOIDECTOMY     x3 on right  . NASAL SEPTUM SURGERY     nasal septal repair  . PENILE PROSTHESIS  REMOVAL    . PENILE PROSTHESIS IMPLANT     x 2--got infection after 2nd and had to remove  . PILONIDAL CYST / SINUS EXCISION    . SHOULDER ARTHROSCOPY     right  . TONSILLECTOMY AND ADENOIDECTOMY      There were no vitals filed for this visit.  Subjective Assessment - 01/13/19 0926    Subjective  Pt reports adherence to HEP. He had some post exercise muscle soreness that lasted 1-2 days but no c/o pain. No questions or concerns at this time.    Patient is accompained by:  Family member    Pertinent History  Patinet has been using a rollator for a year and 1/2, and a cane for 4 years  before that. He has been having PT for the last 4 years. He has had several falls.    How long can you stand comfortably?  15 mins    How long can you walk comfortably?  150 feet    Patient Stated Goals  Patient  wants to be stronger to be abel to stand and do activiites.    Currently in Pain?  No/denies    Pain Onset  --           TREATMENT CGA-minA for all activities, standing in front of treadmill bar for safety   Therapeutic Exercise  NuStep L2x2 L3x3 minutes warmup during history; post warm up vitals: BP = 160/74 mmHg SPO2 = 96% HR = 92 BORG RPE = 10/20 STS x10; verbal cueing to bring pelvis anteriorly, CGA-minA for safety, noticeable LE buckling due to weakness  Standing marches 1# AW x10 bilaterally Standing hip abduction 1# AW x10 bilaterally Standing hip extension 1# AWx10 bilaterally Standing HS curl 1# AW x10 bilaterally; verbal and tactile cueing for postural correction and to dec hip flexion  Seated heel/toe raises #1 AW x10 each Side stepping x3 lengths each direction in // bars; CGA with intermittent BUE support for steadying    Neuromuscular Re-education  Standing airex alt toe taps 4" step x10 bilaterally  Standing airex feet together EO, EC x30s; severe difficulty with EC  Standing normal stance EO with head turns horizontal and vertical 2x30s each; bilateral UE support for steadying, greater difficulty with vertical head turns  Standing staggered stance x30s each Standing SLS x30s each with BUE support needed intermittently and minA for safety     Pt educated throughout session about proper posture and technique with exercises. Improved exercise technique, movement at target joints, use of target muscles after min to mod verbal, visual, tactile cues.    Pt demonstrates good motivation throughout therapy today. Pt demonstrates hip weakness globally needing intermittent rest breaks throughout session due to LE fatigue and dyspnea on exertion. He often becomes  SOB d/t COPD requiring rest breaks after 1-2 exercises to catch his breath. He requires min verbal and tactile cueing for correct exercise technique especially with bringing the pelvis anteriorly during sit to stand exercise. Pt LOB posteriorly during sit to stand due to decr anterior weight shift and bilateral LE weakness and fatigue. He demonstrates incr difficulty with eyes closed balance requiring BUE for steadying and minA for safety and occasional knee buckling. Pt verbalized and demonstrated difficulty with normal stance,  eyes opened head turns. He had greater difficulty with vertical head turns requiring minA for safety and steadying and intermittent BUE support. Pt requires verbal cues for standing hip strengthening exercises for form especially with hip abduction to prevent lateral trunk leaning.      PT Education - 01/13/19 0927    Education Details  technique and form throughout treatment    Person(s) Educated  Patient    Methods  Explanation;Demonstration;Tactile cues;Verbal cues    Comprehension  Verbal cues required;Tactile cues required;Returned demonstration;Verbalized understanding       PT Short Term Goals - 12/31/18 1001      PT SHORT TERM GOAL #1   Title  Patient will be independent in home exercise program to improve strength/mobility for better functional independence with ADLs.    Time  4    Period  Weeks    Status  New    Target Date  01/29/19      PT SHORT TERM GOAL #2   Title  Patient (> 55 years old) will complete five times sit to stand test in < 15 seconds indicating an increased LE strength and improved balance.    Time  4    Period  Weeks    Status  New    Target Date  01/29/19        PT Long Term Goals - 12/31/18 1003      PT LONG TERM GOAL #1   Title  Patient will increase 10 meter walk test to >1.66m/s as to improve gait speed for better community ambulation and to reduce fall risk.    Time  8    Period  Weeks    Status  New    Target Date   02/26/19      PT LONG TERM GOAL #2   Title  Patient will reduce timed up and go to <11 seconds to reduce fall risk and demonstrate improved transfer/gait ability.    Time  8    Period  Weeks    Status  New    Target Date  02/26/19      PT LONG TERM GOAL #3   Title  Patient will increase BLE gross strength to 4+/5 as to improve functional strength for independent gait, increased standing tolerance and increased ADL ability.    Time  8    Period  Weeks    Status  New    Target Date  02/26/19            Plan - 01/13/19 1031    Clinical Impression Statement  Pt demonstrates good motivation throughout therapy today. Pt demonstrates hip weakness globally needing intermittent rest breaks throughout session due to LE fatigue and dyspnea on exertion. He often becomes SOB d/t COPD requiring rest breaks after 1-2 exercises to catch his breath. He requires min verbal and tactile cueing for correct exercise technique especially with bringing the pelvis anteriorly during sit to stand exercise. Pt LOB posteriorly during sit to stand due to decr anterior weight shift and bilateral LE weakness and fatigue. He demonstrates incr difficulty with eyes closed balance requiring BUE for steadying and minA for safety and occasional knee buckling. Pt verbalized and demonstrated difficulty with normal stance, eyes opened head turns. He had greater difficulty with vertical head turns requiring minA for safety and steadying and intermittent BUE support. Pt requires verbal cues for standing hip strengthening exercises for form especially with hip abduction to prevent lateral trunk leaning.    Personal Factors  and Comorbidities  Age    Examination-Activity Limitations  Locomotion Level;Stand;Transfers    Examination-Participation Restrictions  Driving;Community Activity    Stability/Clinical Decision Making  Stable/Uncomplicated    Rehab Potential  Good    PT Frequency  2x / week    PT Duration  8 weeks    PT  Treatment/Interventions  Therapeutic exercise;Therapeutic activities;Balance training;Neuromuscular re-education;Patient/family education;Manual techniques    PT Next Visit Plan  balance and therapeutic exericse    Consulted and Agree with Plan of Care  Patient;Family member/caregiver       Patient will benefit from skilled therapeutic intervention in order to improve the following deficits and impairments:  Abnormal gait, Decreased activity tolerance, Decreased endurance, Decreased strength, Impaired flexibility, Difficulty walking, Decreased mobility, Cardiopulmonary status limiting activity  Visit Diagnosis: 1. Muscle weakness (generalized)   2. Difficulty in walking, not elsewhere classified        Problem List Patient Active Problem List   Diagnosis Date Noted  . Dysuria 12/31/2017  . Constipation 12/31/2017  . CAD (coronary artery disease), native coronary artery 12/28/2017  . Bilateral carotid artery stenosis 12/04/2017  . Laceration of right leg excluding thigh 10/23/2017  . Alcohol dependence in remission ([place]) 06/24/2017  . Imbalance 02/05/2017  . Mitral regurgitation 07/17/2016  . Dyslipidemia 07/14/2016  . Encounter for anticoagulation discussion and counseling 05/03/2015  . Chronic anticoagulation 03/01/2015  . 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 TIA (transient ischemic attack) 12/31/2014  . Advanced directives, counseling/discussion 04/19/2014  . Spinal stenosis, lumbar region, with neurogenic claudication 12/23/2012  . Osteoarthritis, multiple sites 10/08/2012  . Preventative health care 02/25/2012  . MI, old with prior NSTEMI x 2 12/28/2011  . Chronic diastolic HF (heart failure) ([place]) 12/28/2011    Class: Acute  . Edema 09/20/2010  . Allergic rhinitis 11/11/2008  . Mood disorder ([place]) 04/14/2008  . Essential hypertension 04/14/2008  . Chronic atrial fibrillation 04/14/2008  . COPD GOLD II B 04/14/2008    This entire session was  performed under direct supervision and direction of a licensed therapist/therapist assistant . I have personally read, edited and approve of the note as written.    Elmyra Ricks Peyten Weare SPT Phillips Grout PT, DPT, GCS  Huprich,Jason 01/13/2019, 1:24 PM  [place] MAIN [facility] SERVICES [address] [place], [place], [address] Phone: [phone]   Fax:  [phone]  Name: MOHD. DERFLINGER MRN: [id number] Date of Birth: Sep 18, 1930

## 2019-01-15 ENCOUNTER — Ambulatory Visit: Payer: PPO

## 2019-01-15 ENCOUNTER — Other Ambulatory Visit: Payer: Self-pay

## 2019-01-15 DIAGNOSIS — M6281 Muscle weakness (generalized): Secondary | ICD-10-CM | POA: Diagnosis not present

## 2019-01-15 DIAGNOSIS — R262 Difficulty in walking, not elsewhere classified: Secondary | ICD-10-CM

## 2019-01-15 NOTE — Therapy (Signed)
[place] MAIN [facility] SERVICES [address] [place], [place], [address] Phone: [phone]   Fax:  [phone]  Physical Therapy Treatment  Patient Details  Name: Eduardo [name] MRN: [id number] Date of Birth: 1931-02-19 Referring Provider (PT): [name]   Encounter Date: 01/15/2019  PT End of Session - 01/15/19 0926    Visit Number  4    Number of Visits  17    Date for PT Re-Evaluation  02/25/19    PT Start Time  0933    PT Stop Time  1018    PT Time Calculation (min)  45 min    Equipment Utilized During Treatment  Gait belt    Activity Tolerance  Patient tolerated treatment well    Behavior During Therapy  [facility] for tasks assessed/performed       Past Medical History:  Diagnosis Date  . Allergy   . Anemia 2012, 08/2015   chronic. Acute due to large hematoma 2013. 2017: due to gastric AVM bleeding.   . Anxiety   . CAD (coronary artery disease)    a. 06/2010 Cath: LM nl, LAD 50p/m, LCX 53m, RCA 50p/m -->catheter dissection but good flow, 70d, RPDA 50-70;  b. 06/2010 Relook cath due to bradycardia and inf ST elev: RCA dissection @ ostium extending into coronary cusp and prox RCA, Ao dissection-->less staining than previously-->Med Rx.  . Chronic combined systolic (congestive) and diastolic (congestive) heart failure ([place])    a. 12/2014 Echo: EF 60-65%; b. 07/2016 Echo: EF 45-50%, mild LVH. Mild AS/MS. Mod-sev MR. Mild to mod TR. PASP 16mmHg; c. 08/2018 Echo: EF 55-60%, RVSP 68.25mmHg. Sev dil LA, mod dil RA. at least mod MR, mild to mod TR. Mild AS.  Marland Kitchen COPD (chronic obstructive pulmonary disease) ([place]) 2013   exertional dyspnea  . Gastric angiodysplasia with hemorrhage 08/2015  . Hx of colonic polyps 2001, 2011   adenomatous 2001. HP 2001, 2011.  Marland Kitchen Hyperlipidemia   . Mitral regurgitation    a. 07/2016 Echo: Mod to sev MR; b. 08/2018 Echo: at least moderate MR.  . Myocardial infarction ([place]) 06/2010.  12/2011.   "twice; 1 day apart;  during/after cath". NQMI in setting severe anemia 2013.   Marland Kitchen Neuropathy 2014   in feet, likely due to spinal stenosis, DDD, HNP  . Osteoarthritis 2002   spinal stenosis, spondylolisthesis, disc protrusion multilevel in lumbar spine  . Permanent atrial fibrillation 2012   a.  CHA2DS2VASc = 6-->eliquis;  b. 12/2015 Echo: EF 60-65%, no rwma, LVH, mild AS/MS/MR, sev dil LA, mild TR, PASP 71mmHg.  . Stroke ([place]) 2016  . Upper GI bleed 08/2015   EGD: 2 small gastric AVMs, 1 actively bleeding.  Both treated with APC ablation, clipping of the bleeder    Past Surgical History:  Procedure Laterality Date  . APPENDECTOMY    . CARDIAC CATHETERIZATION  2012   50% LAD and luminal irreg in RCA, 1/12  Post cath MI from damage  . CARPAL TUNNEL RELEASE     left  . CARPAL TUNNEL RELEASE  10/11/11   Dr Rush Barer  . CATARACT EXTRACTION W/ INTRAOCULAR LENS IMPLANT  2/13   Dr [name]  . COLONOSCOPY  2001, 02/2010   Dr Deatra Ina.   . ESOPHAGOGASTRODUODENOSCOPY N/A 08/15/2015   [name] MD; 2 small gastric AVMs, 1 actively bleeding.  Both treated with APC ablation, clipping of the bleeder  . FOOT FRACTURE SURGERY     right heel repair  . FRACTURE  SURGERY    . KNEE ARTHROSCOPY     left, right knee 10/2009  . MASTOIDECTOMY     x3 on right  . NASAL SEPTUM SURGERY     nasal septal repair  . PENILE PROSTHESIS  REMOVAL    . PENILE PROSTHESIS IMPLANT     x 2--got infection after 2nd and had to remove  . PILONIDAL CYST / SINUS EXCISION    . SHOULDER ARTHROSCOPY     right  . TONSILLECTOMY AND ADENOIDECTOMY      There were no vitals filed for this visit.  Subjective Assessment - 01/15/19 0926    Subjective  Pt reports doing well today. He experienced some soreness after the last session that lasted 1-2 days but no c/o pain. He was fatigued following the last session but not to the point of preventing him from participating in his typical daily activities. No falls reported since last session. No  questions or concerns at this time.    Patient is accompained by:  Family member    Pertinent History  Patinet has been using a rollator for a year and 1/2, and a cane for 4 years before that. He has been having PT for the last 4 years. He has had several falls.    How long can you stand comfortably?  15 mins    How long can you walk comfortably?  150 feet    Patient Stated Goals  Patient  wants to be stronger to be abel to stand and do activiites.    Currently in Pain?  No/denies        TREATMENT CGA-minA for all activities, standing in front of treadmill bar for safety    Therapeutic Exercise  NuStep L3x5 minutes warmup during history; post warm up vitals: BP =  139/56 mmHg SPO2 = 98% HR =  73 BORG RPE = 15/20 BORG dyspnea = 6/10 STS x10; verbal cueing to bring pelvis anteriorly, CGA-minA for safety, noticeable LE buckling due to weakness, BORG dyspnea = 5/10 Seated heel/toe raises 3s hold x10 each 4" forward step up x10 alt LE; SPO2%= 93% post ex taking ~2 minutes to inc to 98%, min-mod verbal cues for anterior pelvic weight shift 4" lateral step ups x10 bilaterally; min-mod verbal cues for anterior pelvic weight shift; vitals: SPO2% = 94% taking two mins to inc to 98%, BORG dyspnea = 6/10   Neuromuscular Re-education  Standing airex marches 2x30s   Standing airex feet together EO horizontal and vertical head turns x30s each; min ankle strategy noted to due weakness, max knee strategy for steadying, minA for safety  Standing modified tandem x30s each Standing modified tandem with horizontal and vertical head turns x30s each       Pt educated throughout session about proper posture and technique with exercises. Improved exercise technique, movement at target joints, use of target muscles after min to mod verbal, visual, tactile cues.      Pt demonstrates good motivation throughout therapy today. He often becomes SOB d/t COPD requiring rest breaks after 1-2 exercises to catch his  breath. Throughout session BORG dyspnea ranged between 5-6/10 after exercises. Pt requires mod verbal and tactile cueing with forward and lateral step ups for anterior pelvic weight shift, without cueing pt LOB posteriorly due to weak hip extensor moment. Pt has difficulty with balance activities requiring intermittent UE support for steadying and CGA-minA for safety. He demonstrates very minimal ankle strategy during balance activities secondary to significant ankle weakness and relies heavily  on knee strategy for steadying. All exercises were performed next to treadmill bar for safety. Pt requires verbal cues for standing hip strengthening exercises for form especially with hip abduction to prevent lateral trunk leaning.        PT Education - 01/15/19 [phone]    Education Details  verbal and tactile cues given throughout session for proper form    Person(s) Educated  Patient    Methods  Explanation;Demonstration;Tactile cues;Verbal cues    Comprehension  Verbal cues required;Returned demonstration;Verbalized understanding;Tactile cues required       PT Short Term Goals - 12/31/18 1001      PT SHORT TERM GOAL #1   Title  Patient will be independent in home exercise program to improve strength/mobility for better functional independence with ADLs.    Time  4    Period  Weeks    Status  New    Target Date  01/29/19      PT SHORT TERM GOAL #2   Title  Patient (> 73 years old) will complete five times sit to stand test in < 15 seconds indicating an increased LE strength and improved balance.    Time  4    Period  Weeks    Status  New    Target Date  01/29/19        PT Long Term Goals - 12/31/18 1003      PT LONG TERM GOAL #1   Title  Patient will increase 10 meter walk test to >1.3m/s as to improve gait speed for better community ambulation and to reduce fall risk.    Time  8    Period  Weeks    Status  New    Target Date  02/26/19      PT LONG TERM GOAL #2   Title  Patient will  reduce timed up and go to <11 seconds to reduce fall risk and demonstrate improved transfer/gait ability.    Time  8    Period  Weeks    Status  New    Target Date  02/26/19      PT LONG TERM GOAL #3   Title  Patient will increase BLE gross strength to 4+/5 as to improve functional strength for independent gait, increased standing tolerance and increased ADL ability.    Time  8    Period  Weeks    Status  New    Target Date  02/26/19            Plan - 01/15/19 0926    Clinical Impression Statement  Pt demonstrates good motivation throughout therapy today. He often becomes SOB d/t COPD requiring rest breaks after 1-2 exercises to catch his breath. Throughout session BORG dyspnea ranged between 5-6/10 after exercises. Pt requires mod verbal and tactile cueing with forward and lateral step ups for anterior pelvic weight shift, without cueing pt LOB posteriorly due to weak hip extensor moment. Pt has difficulty with balance activities requiring intermittent UE support for steadying and CGA-minA for safety. He demonstrates very minimal ankle strategy during balance activities secondary to significant ankle weakness and relies heavily on knee strategy for steadying. All exercises were performed next to treadmill bar for safety. Pt requires verbal cues for standing hip strengthening exercises for form especially with hip abduction to prevent lateral trunk leaning.    Personal Factors and Comorbidities  Age    Examination-Activity Limitations  Locomotion Level;Stand;Transfers    Examination-Participation Restrictions  Driving;Community Activity    Stability/Clinical  Decision Making  Stable/Uncomplicated    Rehab Potential  Good    PT Frequency  2x / week    PT Duration  8 weeks    PT Treatment/Interventions  Therapeutic exercise;Therapeutic activities;Balance training;Neuromuscular re-education;Patient/family education;Manual techniques    PT Next Visit Plan  gait in // bars, dynamic balance     Consulted and Agree with Plan of Care  Patient;Family member/caregiver       Patient will benefit from skilled therapeutic intervention in order to improve the following deficits and impairments:  Abnormal gait, Decreased activity tolerance, Decreased endurance, Decreased strength, Impaired flexibility, Difficulty walking, Decreased mobility, Cardiopulmonary status limiting activity  Visit Diagnosis: 1. Muscle weakness (generalized)   2. Difficulty in walking, not elsewhere classified        Problem List Patient Active Problem List   Diagnosis Date Noted  . Dysuria 12/31/2017  . Constipation 12/31/2017  . CAD (coronary artery disease), native coronary artery 12/28/2017  . Bilateral carotid artery stenosis 12/04/2017  . Laceration of right leg excluding thigh 10/23/2017  . Alcohol dependence in remission ([place]) 06/24/2017  . Imbalance 02/05/2017  . Mitral regurgitation 07/17/2016  . Dyslipidemia 07/14/2016  . Encounter for anticoagulation discussion and counseling 05/03/2015  . Chronic anticoagulation 03/01/2015  . 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 TIA (transient ischemic attack) 12/31/2014  . Advanced directives, counseling/discussion 04/19/2014  . Spinal stenosis, lumbar region, with neurogenic claudication 12/23/2012  . Osteoarthritis, multiple sites 10/08/2012  . Preventative health care 02/25/2012  . MI, old with prior NSTEMI x 2 12/28/2011  . Chronic diastolic HF (heart failure) ([place]) 12/28/2011    Class: Acute  . Edema 09/20/2010  . Allergic rhinitis 11/11/2008  . Mood disorder ([place]) 04/14/2008  . Essential hypertension 04/14/2008  . Chronic atrial fibrillation 04/14/2008  . COPD GOLD II B 04/14/2008   This entire session was performed under the direct supervision of a liscensed physical therapist.   Juliann Pulse SPT  Juliann Pulse 01/15/2019, 11:07 AM  9:13 AM, 01/16/19 Etta Grandchild, PT, DPT Physical Therapist - [facility]  Outpatient Physical Therapy- [place] MAIN [facility] SERVICES [address] [place], [place], [address] Phone: [phone]   Fax:  [phone]  Name: Eduardo [name] MRN: [id number] Date of Birth: 1930-07-27

## 2019-01-22 ENCOUNTER — Other Ambulatory Visit: Payer: Self-pay

## 2019-01-22 ENCOUNTER — Ambulatory Visit: Payer: PPO

## 2019-01-22 DIAGNOSIS — R262 Difficulty in walking, not elsewhere classified: Secondary | ICD-10-CM

## 2019-01-22 DIAGNOSIS — M6281 Muscle weakness (generalized): Secondary | ICD-10-CM | POA: Diagnosis not present

## 2019-01-22 NOTE — Therapy (Signed)
[place] MAIN [facility] SERVICES [address] [place], [place], [address] Phone: [phone]   Fax:  [phone]  Physical Therapy Treatment  Patient Details  Name: Eduardo [name] MRN: [id number] Date of Birth: Jan 06, 1931 Referring Provider (PT): [name]   Encounter Date: 01/22/2019  PT End of Session - 01/22/19 1050    Visit Number  5    Number of Visits  17    Date for PT Re-Evaluation  02/25/19    PT Start Time  1100    PT Stop Time  1145    PT Time Calculation (min)  45 min    Equipment Utilized During Treatment  Gait belt    Activity Tolerance  Patient tolerated treatment well    Behavior During Therapy  [facility] for tasks assessed/performed       Past Medical History:  Diagnosis Date  . Allergy   . Anemia 2012, 08/2015   chronic. Acute due to large hematoma 2013. 2017: due to gastric AVM bleeding.   . Anxiety   . CAD (coronary artery disease)    a. 06/2010 Cath: LM nl, LAD 50p/m, LCX 29m, RCA 50p/m -->catheter dissection but good flow, 70d, RPDA 50-70;  b. 06/2010 Relook cath due to bradycardia and inf ST elev: RCA dissection @ ostium extending into coronary cusp and prox RCA, Ao dissection-->less staining than previously-->Med Rx.  . Chronic combined systolic (congestive) and diastolic (congestive) heart failure ([place])    a. 12/2014 Echo: EF 60-65%; b. 07/2016 Echo: EF 45-50%, mild LVH. Mild AS/MS. Mod-sev MR. Mild to mod TR. PASP 62mmHg; c. 08/2018 Echo: EF 55-60%, RVSP 68.81mmHg. Sev dil LA, mod dil RA. at least mod MR, mild to mod TR. Mild AS.  Marland Kitchen COPD (chronic obstructive pulmonary disease) ([place]) 2013   exertional dyspnea  . Gastric angiodysplasia with hemorrhage 08/2015  . Hx of colonic polyps 2001, 2011   adenomatous 2001. HP 2001, 2011.  Marland Kitchen Hyperlipidemia   . Mitral regurgitation    a. 07/2016 Echo: Mod to sev MR; b. 08/2018 Echo: at least moderate MR.  . Myocardial infarction ([place]) 06/2010.  12/2011.   "twice; 1 day apart;  during/after cath". NQMI in setting severe anemia 2013.   Marland Kitchen Neuropathy 2014   in feet, likely due to spinal stenosis, DDD, HNP  . Osteoarthritis 2002   spinal stenosis, spondylolisthesis, disc protrusion multilevel in lumbar spine  . Permanent atrial fibrillation 2012   a.  CHA2DS2VASc = 6-->eliquis;  b. 12/2015 Echo: EF 60-65%, no rwma, LVH, mild AS/MS/MR, sev dil LA, mild TR, PASP 61mmHg.  . Stroke ([place]) 2016  . Upper GI bleed 08/2015   EGD: 2 small gastric AVMs, 1 actively bleeding.  Both treated with APC ablation, clipping of the bleeder    Past Surgical History:  Procedure Laterality Date  . APPENDECTOMY    . CARDIAC CATHETERIZATION  2012   50% LAD and luminal irreg in RCA, 1/12  Post cath MI from damage  . CARPAL TUNNEL RELEASE     left  . CARPAL TUNNEL RELEASE  10/11/11   Dr Rush Barer  . CATARACT EXTRACTION W/ INTRAOCULAR LENS IMPLANT  2/13   Dr [name]  . COLONOSCOPY  2001, 02/2010   Dr Deatra Ina.   . ESOPHAGOGASTRODUODENOSCOPY N/A 08/15/2015   [name] MD; 2 small gastric AVMs, 1 actively bleeding.  Both treated with APC ablation, clipping of the bleeder  . FOOT FRACTURE SURGERY     right heel repair  . FRACTURE  SURGERY    . KNEE ARTHROSCOPY     left, right knee 10/2009  . MASTOIDECTOMY     x3 on right  . NASAL SEPTUM SURGERY     nasal septal repair  . PENILE PROSTHESIS  REMOVAL    . PENILE PROSTHESIS IMPLANT     x 2--got infection after 2nd and had to remove  . PILONIDAL CYST / SINUS EXCISION    . SHOULDER ARTHROSCOPY     right  . TONSILLECTOMY AND ADENOIDECTOMY      There were no vitals filed for this visit.  Subjective Assessment - 01/22/19 1050    Subjective  Pt reports feeling good today. He states feeling sore for about 1-2 days after the last session. No questions or concerns at this tine.    Patient is accompained by:  Family member    Pertinent History  Patinet has been using a rollator for a year and 1/2, and a cane for 4 years before that.  He has been having PT for the last 4 years. He has had several falls.    How long can you stand comfortably?  15 mins    How long can you walk comfortably?  150 feet    Patient Stated Goals  Patient  wants to be stronger to be abel to stand and do activiites.    Currently in Pain?  No/denies        TREATMENT CGA-minA for all activities, standing in front of treadmill bar for safety    Therapeutic Exercise   STS 2x10; verbal cueing to bring pelvis anteriorly, CGA-minA for safety, noticeable LE buckling due to weakness Seated heel/toe raises 1# 3s hold x10 each 4" forward step up x10 alt LE; min-mod verbal cues for anterior pelvic weight shift 4" lateral step ups x7 bilaterally; min-mod verbal cues for anterior pelvic weight shift, mod knee buckling throughout    Standing marches 1# AW x10 bilaterally Standing hip abduction 1# AW x10 bilaterally Standing hip extension 1# AWx10 bilaterally Standing HS curl 1# AW x10 bilaterally; verbal and tactile cueing for postural correction and to dec hip flexion  Seated heel/toe raises #1 AW x10 each Seated LAQ 2.5# x10 each LE  Gait in // bars forward, retro and side stepping x5 lengths each direction in // bars; CGA with BUE support for steadying     Neuromuscular Re-education  Standing airex marches 2x30s   Standing airex wide BOS EO x30s; min ankle strategy noted to due weakness, max knee strategy for steadying, minA for safety  Standing modified tandem x30s each; intermittent BUE support          Pt educated throughout session about proper posture and technique with exercises. Improved exercise technique, movement at target joints, use of target muscles after min to mod verbal, visual, tactile cues.      Pt demonstrates good motivation throughout therapy today. He presents to therapy today with a SPC with a mildly wider BOS d/t not having his 3WW in the car and forgetting it at home. Pt compensates global hip weakness with lateral trunk  leaning, wide BOS, and hip ER bilaterally during ambulation. Pt requires mod verbal and tactile cueing with forward and lateral step ups for anterior pelvic weight shift, without cueing pt LOB posteriorly due to weak hip extensor moment. He demonstrates significant quad weakness bilaterally with knee buckling bilaterally intermittently throughout standing exercises. Pt compensates bilateral weak HS with hip flexion during standing HS curls requiring mod verbal and max tactile  cueing. Pt has difficulty with balance activities requiring intermittent UE support for steadying and CGA-minA for safety. He demonstrates very minimal ankle strategy during balance activities secondary to significant ankle weakness and relies heavily on knee/hip strategy for steadying. All exercises were performed next to treadmill bar for safety. Pt requires verbal cues for standing hip strengthening exercises for form especially with hip abduction to prevent lateral trunk leaning.          PT Education - 01/22/19 1050    Education Details  cueing for form throughout session    Person(s) Educated  Patient    Methods  Explanation;Demonstration;Tactile cues;Verbal cues    Comprehension  Verbal cues required;Tactile cues required;Returned demonstration;Verbalized understanding       PT Short Term Goals - 12/31/18 1001      PT SHORT TERM GOAL #1   Title  Patient will be independent in home exercise program to improve strength/mobility for better functional independence with ADLs.    Time  4    Period  Weeks    Status  New    Target Date  01/29/19      PT SHORT TERM GOAL #2   Title  Patient (> 35 years old) will complete five times sit to stand test in < 15 seconds indicating an increased LE strength and improved balance.    Time  4    Period  Weeks    Status  New    Target Date  01/29/19        PT Long Term Goals - 12/31/18 1003      PT LONG TERM GOAL #1   Title  Patient will increase 10 meter walk test to  >1.25m/s as to improve gait speed for better community ambulation and to reduce fall risk.    Time  8    Period  Weeks    Status  New    Target Date  02/26/19      PT LONG TERM GOAL #2   Title  Patient will reduce timed up and go to <11 seconds to reduce fall risk and demonstrate improved transfer/gait ability.    Time  8    Period  Weeks    Status  New    Target Date  02/26/19      PT LONG TERM GOAL #3   Title  Patient will increase BLE gross strength to 4+/5 as to improve functional strength for independent gait, increased standing tolerance and increased ADL ability.    Time  8    Period  Weeks    Status  New    Target Date  02/26/19            Plan - 01/22/19 1051    Clinical Impression Statement  Pt demonstrates good motivation throughout therapy today. He presents to therapy today with a SPC with a mildly wider BOS d/t not having his 3WW in the car and forgetting it at home. Pt compensates global hip weakness with lateral trunk leaning, wide BOS, and hip ER bilaterally during ambulation. Pt requires mod verbal and tactile cueing with forward and lateral step ups for anterior pelvic weight shift, without cueing pt LOB posteriorly due to weak hip extensor moment. He demonstrates significant quad weakness bilaterally with knee buckling bilaterally intermittently throughout standing exercises. Pt compensates bilateral weak HS with hip flexion during standing HS curls requiring mod verbal and max tactile cueing. Pt has difficulty with balance activities requiring intermittent UE support for steadying and CGA-minA for  safety. He demonstrates very minimal ankle strategy during balance activities secondary to significant ankle weakness and relies heavily on knee/hip strategy for steadying. All exercises were performed next to treadmill bar for safety. Pt requires verbal cues for standing hip strengthening exercises for form especially with hip abduction to prevent lateral trunk leaning.     Personal Factors and Comorbidities  Age    Examination-Activity Limitations  Locomotion Level;Stand;Transfers    Examination-Participation Restrictions  Driving;Community Activity    Stability/Clinical Decision Making  Stable/Uncomplicated    Rehab Potential  Good    PT Frequency  2x / week    PT Duration  8 weeks    PT Treatment/Interventions  Therapeutic exercise;Therapeutic activities;Balance training;Neuromuscular re-education;Patient/family education;Manual techniques    PT Next Visit Plan  gait in // bars, dynamic balance    Consulted and Agree with Plan of Care  Patient;Family member/caregiver       Patient will benefit from skilled therapeutic intervention in order to improve the following deficits and impairments:  Abnormal gait, Decreased activity tolerance, Decreased endurance, Decreased strength, Impaired flexibility, Difficulty walking, Decreased mobility, Cardiopulmonary status limiting activity  Visit Diagnosis: 1. Muscle weakness (generalized)   2. Difficulty in walking, not elsewhere classified        Problem List Patient Active Problem List   Diagnosis Date Noted  . Dysuria 12/31/2017  . Constipation 12/31/2017  . CAD (coronary artery disease), native coronary artery 12/28/2017  . Bilateral carotid artery stenosis 12/04/2017  . Laceration of right leg excluding thigh 10/23/2017  . Alcohol dependence in remission ([place]) 06/24/2017  . Imbalance 02/05/2017  . Mitral regurgitation 07/17/2016  . Dyslipidemia 07/14/2016  . Encounter for anticoagulation discussion and counseling 05/03/2015  . Chronic anticoagulation 03/01/2015  . 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 TIA (transient ischemic attack) 12/31/2014  . Advanced directives, counseling/discussion 04/19/2014  . Spinal stenosis, lumbar region, with neurogenic claudication 12/23/2012  . Osteoarthritis, multiple sites 10/08/2012  . Preventative health care 02/25/2012  . MI, old with  prior NSTEMI x 2 12/28/2011  . Chronic diastolic HF (heart failure) ([place]) 12/28/2011    Class: Acute  . Edema 09/20/2010  . Allergic rhinitis 11/11/2008  . Mood disorder ([place]) 04/14/2008  . Essential hypertension 04/14/2008  . Chronic atrial fibrillation 04/14/2008  . COPD GOLD II B 04/14/2008   This entire session was performed under direct supervision and direction of a licensed therapist/therapist assistant . I have personally read, edited and approve of the note as written.   Elmyra Ricks Khoury Siemon SPT Phillips Grout PT, DPT, GCS  Huprich,Jason 01/22/2019, 2:47 PM  [place] MAIN [facility] SERVICES [address] [place], [place], [address] Phone: [phone]   Fax:  [phone]  Name: Eduardo [name] MRN: [id number] Date of Birth: May 21, 1931

## 2019-01-27 ENCOUNTER — Ambulatory Visit: Payer: PPO | Admitting: Physical Therapy

## 2019-01-27 ENCOUNTER — Other Ambulatory Visit: Payer: Self-pay

## 2019-01-27 ENCOUNTER — Encounter: Payer: Self-pay | Admitting: Physical Therapy

## 2019-01-27 DIAGNOSIS — M6281 Muscle weakness (generalized): Secondary | ICD-10-CM | POA: Diagnosis not present

## 2019-01-27 DIAGNOSIS — R262 Difficulty in walking, not elsewhere classified: Secondary | ICD-10-CM

## 2019-01-27 NOTE — Therapy (Signed)
[place] MAIN [facility] SERVICES [address] [place], [place], [address] Phone: [phone]   Fax:  [phone]  Physical Therapy Treatment  Patient Details  Name: RAYGEN DAHM MRN: [id number] Date of Birth: 1931/03/10 Referring Provider (PT): [name]   Encounter Date: 01/27/2019  PT End of Session - 01/27/19 1111    Visit Number  6    Number of Visits  17    Date for PT Re-Evaluation  02/25/19    PT Start Time  1102    PT Stop Time  1145    PT Time Calculation (min)  43 min    Equipment Utilized During Treatment  Gait belt    Activity Tolerance  Patient tolerated treatment well    Behavior During Therapy  [facility] for tasks assessed/performed       Past Medical History:  Diagnosis Date  . Allergy   . Anemia 2012, 08/2015   chronic. Acute due to large hematoma 2013. 2017: due to gastric AVM bleeding.   . Anxiety   . CAD (coronary artery disease)    a. 06/2010 Cath: LM nl, LAD 50p/m, LCX 4m, RCA 50p/m -->catheter dissection but good flow, 70d, RPDA 50-70;  b. 06/2010 Relook cath due to bradycardia and inf ST elev: RCA dissection @ ostium extending into coronary cusp and prox RCA, Ao dissection-->less staining than previously-->Med Rx.  . Chronic combined systolic (congestive) and diastolic (congestive) heart failure ([place])    a. 12/2014 Echo: EF 60-65%; b. 07/2016 Echo: EF 45-50%, mild LVH. Mild AS/MS. Mod-sev MR. Mild to mod TR. PASP 34mmHg; c. 08/2018 Echo: EF 55-60%, RVSP 68.5mmHg. Sev dil LA, mod dil RA. at least mod MR, mild to mod TR. Mild AS.  Marland Kitchen COPD (chronic obstructive pulmonary disease) ([place]) 2013   exertional dyspnea  . Gastric angiodysplasia with hemorrhage 08/2015  . Hx of colonic polyps 2001, 2011   adenomatous 2001. HP 2001, 2011.  Marland Kitchen Hyperlipidemia   . Mitral regurgitation    a. 07/2016 Echo: Mod to sev MR; b. 08/2018 Echo: at least moderate MR.  . Myocardial infarction ([place]) 06/2010.  12/2011.   "twice; 1 day apart;  during/after cath". NQMI in setting severe anemia 2013.   Marland Kitchen Neuropathy 2014   in feet, likely due to spinal stenosis, DDD, HNP  . Osteoarthritis 2002   spinal stenosis, spondylolisthesis, disc protrusion multilevel in lumbar spine  . Permanent atrial fibrillation 2012   a.  CHA2DS2VASc = 6-->eliquis;  b. 12/2015 Echo: EF 60-65%, no rwma, LVH, mild AS/MS/MR, sev dil LA, mild TR, PASP 62mmHg.  . Stroke ([place]) 2016  . Upper GI bleed 08/2015   EGD: 2 small gastric AVMs, 1 actively bleeding.  Both treated with APC ablation, clipping of the bleeder    Past Surgical History:  Procedure Laterality Date  . APPENDECTOMY    . CARDIAC CATHETERIZATION  2012   50% LAD and luminal irreg in RCA, 1/12  Post cath MI from damage  . CARPAL TUNNEL RELEASE     left  . CARPAL TUNNEL RELEASE  10/11/11   Dr Rush Barer  . CATARACT EXTRACTION W/ INTRAOCULAR LENS IMPLANT  2/13   Dr [name]  . COLONOSCOPY  2001, 02/2010   Dr Deatra Ina.   . ESOPHAGOGASTRODUODENOSCOPY N/A 08/15/2015   [name] MD; 2 small gastric AVMs, 1 actively bleeding.  Both treated with APC ablation, clipping of the bleeder  . FOOT FRACTURE SURGERY     right heel repair  . FRACTURE  SURGERY    . KNEE ARTHROSCOPY     left, right knee 10/2009  . MASTOIDECTOMY     x3 on right  . NASAL SEPTUM SURGERY     nasal septal repair  . PENILE PROSTHESIS  REMOVAL    . PENILE PROSTHESIS IMPLANT     x 2--got infection after 2nd and had to remove  . PILONIDAL CYST / SINUS EXCISION    . SHOULDER ARTHROSCOPY     right  . TONSILLECTOMY AND ADENOIDECTOMY      There were no vitals filed for this visit.  Subjective Assessment - 01/27/19 1109    Subjective  Patient reports doing well; He reports some soreness in low back yesterday but states its better today; Reports adherence with HEP; He reports still having trouble with his balance;    Patient is accompained by:  Family member    Pertinent History  Patinet has been using a rollator for a  year and 1/2, and a cane for 4 years before that. He has been having PT for the last 4 years. He has had several falls.    How long can you stand comfortably?  15 mins    How long can you walk comfortably?  150 feet    Patient Stated Goals  Patient  wants to be stronger to be abel to stand and do activiites.    Currently in Pain?  Yes    Pain Score  3     Pain Location  Back    Pain Orientation  Lower    Pain Descriptors / Indicators  Aching;Sore    Pain Type  Acute pain    Pain Onset  Yesterday    Pain Frequency  Intermittent    Aggravating Factors   bending/stooping    Pain Relieving Factors  rest    Effect of Pain on Daily Activities  decreased activity tolerance;    Multiple Pain Sites  No              TREATMENT: Warm up on Nustep BUE/BLE level 2 x5 min (unbilled) PT advanced HEP with standing tband exercise, provided written HEP for better adherence: Standing with red tband around BLE: -hip abduction x15 reps bilaterally; -hip extension x15 reps bilaterally; -Side stepping x12 feet x2 laps each direction;   Patient required min-moderate verbal/tactile cues for correct exercise technique including to avoid hip ER with abduction for better hip strengthening; He was able to exhibit better trunk control with less compensation with exercise;   Required intermittent rail assist for safety;                Patient instructed in advanced balance exercise  Standing in parallel bars:   Standing on airex foam: -alternate toe taps to 4 inch step with 1-0 rail assist x15 reps bilaterally; -Standing one foot on airex, one foot on 4 inch step, unsupported, ball pass side/side x5 reps each, each foot on step with min A for safety; -modified tandem stance: head turns side/side, up/down x5 reps each foot in front; Required min-mod A for balance control, exhibiting increased left lateral lean;  -feet together, eyes open/closed 10 sec hold x2 sets; patient exhibits increased knee  flexion with eyes closed, using knee strategies for balance control due to neuropathy in feet;  Patient required min VCs for balance stability, including to increase trunk control for less loss of balance with smaller base of support  Standing on 1/2 bolster: -feet apart: 2-0 rail assist, working on finding  neutral position, unsupported, mini squat x10 reps with min A for safety; Patient has difficulty using ankle strategies often relying on hip/knee control for balance; -tandem stance with 2-0 rail assist, 10 sec hold x2 reps each foot in front;     Response to treatment: Patient often becomes short of breath, Vitals monitored, SPO2 96%, HR in high 80's, low 90's; multiple seated rest breaks offered for patient to catch breath due to dyspnea on exertion; Patient requires min VCs for correct exercise technique for optimum muscle activation and strengthening;  Instructed patient in advanced balance tasks, utilizing compliant surfaces for better balance challenge; Patient does exhibit increased ankle instability especially while standing on compliant surfaces. He was able to reduce rail assist, but does require min A for safety;                   PT Education - 01/27/19 1110    Education Details  balance/gait safety, LE strengthening; HEP reinforced;    Person(s) Educated  Patient    Methods  Explanation;Verbal cues    Comprehension  Verbalized understanding;Returned demonstration;Verbal cues required;Need further instruction       PT Short Term Goals - 12/31/18 1001      PT SHORT TERM GOAL #1   Title  Patient will be independent in home exercise program to improve strength/mobility for better functional independence with ADLs.    Time  4    Period  Weeks    Status  New    Target Date  01/29/19      PT SHORT TERM GOAL #2   Title  Patient (> 66 years old) will complete five times sit to stand test in < 15 seconds indicating an increased LE strength and improved balance.     Time  4    Period  Weeks    Status  New    Target Date  01/29/19        PT Long Term Goals - 12/31/18 1003      PT LONG TERM GOAL #1   Title  Patient will increase 10 meter walk test to >1.3m/s as to improve gait speed for better community ambulation and to reduce fall risk.    Time  8    Period  Weeks    Status  New    Target Date  02/26/19      PT LONG TERM GOAL #2   Title  Patient will reduce timed up and go to <11 seconds to reduce fall risk and demonstrate improved transfer/gait ability.    Time  8    Period  Weeks    Status  New    Target Date  02/26/19      PT LONG TERM GOAL #3   Title  Patient will increase BLE gross strength to 4+/5 as to improve functional strength for independent gait, increased standing tolerance and increased ADL ability.    Time  8    Period  Weeks    Status  New    Target Date  02/26/19            Plan - 01/27/19 1131    Clinical Impression Statement  Patient motivated and participated well within session; Instructed patient in advanced balance exercise, utilizing compliant surface to challenge stance control; He does have difficulty keeping balance with narrow base of support or with eyes closed, often utilizing knee strategies with occasional buckling due to neuropathy in feet; Patient able to exhibit good motor control with  less trunk lean and less compensation with strengthening exercise. he does fatigue with prolonged standing requiring short seated rest breaks with increased dyspnea on exertion; He would benefit from additional skilled PT intervention to improve strength, balance and gait safety;    Personal Factors and Comorbidities  Age    Examination-Activity Limitations  Locomotion Level;Stand;Transfers    Examination-Participation Restrictions  Driving;Community Activity    Stability/Clinical Decision Making  Stable/Uncomplicated    Rehab Potential  Good    PT Frequency  2x / week    PT Duration  8 weeks    PT  Treatment/Interventions  Therapeutic exercise;Therapeutic activities;Balance training;Neuromuscular re-education;Patient/family education;Manual techniques    PT Next Visit Plan  gait in // bars, dynamic balance    Consulted and Agree with Plan of Care  Patient;Family member/caregiver       Patient will benefit from skilled therapeutic intervention in order to improve the following deficits and impairments:  Abnormal gait, Decreased activity tolerance, Decreased endurance, Decreased strength, Impaired flexibility, Difficulty walking, Decreased mobility, Cardiopulmonary status limiting activity  Visit Diagnosis: 1. Muscle weakness (generalized)   2. Difficulty in walking, not elsewhere classified        Problem List Patient Active Problem List   Diagnosis Date Noted  . Dysuria 12/31/2017  . Constipation 12/31/2017  . CAD (coronary artery disease), native coronary artery 12/28/2017  . Bilateral carotid artery stenosis 12/04/2017  . Laceration of right leg excluding thigh 10/23/2017  . Alcohol dependence in remission ([place]) 06/24/2017  . Imbalance 02/05/2017  . Mitral regurgitation 07/17/2016  . Dyslipidemia 07/14/2016  . Encounter for anticoagulation discussion and counseling 05/03/2015  . Chronic anticoagulation 03/01/2015  . 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 TIA (transient ischemic attack) 12/31/2014  . Advanced directives, counseling/discussion 04/19/2014  . Spinal stenosis, lumbar region, with neurogenic claudication 12/23/2012  . Osteoarthritis, multiple sites 10/08/2012  . Preventative health care 02/25/2012  . MI, old with prior NSTEMI x 2 12/28/2011  . Chronic diastolic HF (heart failure) ([place]) 12/28/2011    Class: Acute  . Edema 09/20/2010  . Allergic rhinitis 11/11/2008  . Mood disorder ([place]) 04/14/2008  . Essential hypertension 04/14/2008  . Chronic atrial fibrillation 04/14/2008  . COPD GOLD II B 04/14/2008    Trotter,Margaret PT,  DPT 01/27/2019, 11:57 AM  [place] MAIN [facility] SERVICES [address] [place], [place], [address] Phone: [phone]   Fax:  [phone]  Name: BROWN DUNLAP MRN: [id number] Date of Birth: Sep 07, 1930

## 2019-01-29 ENCOUNTER — Ambulatory Visit: Payer: PPO

## 2019-01-29 ENCOUNTER — Other Ambulatory Visit: Payer: Self-pay

## 2019-01-29 DIAGNOSIS — M6281 Muscle weakness (generalized): Secondary | ICD-10-CM | POA: Diagnosis not present

## 2019-01-29 DIAGNOSIS — R262 Difficulty in walking, not elsewhere classified: Secondary | ICD-10-CM

## 2019-01-29 NOTE — Therapy (Signed)
[place] MAIN [facility] SERVICES [address] [place], [place], [address] Phone: [phone]   Fax:  [phone]  Physical Therapy Treatment  Patient Details  Name: Eduardo [name] MRN: [id number] Date of Birth: [birth year] Referring Provider (PT): [name]   Encounter Date: 01/29/2019  PT End of Session - 01/29/19 1055    Visit Number  7    Number of Visits  17    Date for PT Re-Evaluation  02/25/19    PT Start Time  1100    PT Stop Time  1145    PT Time Calculation (min)  45 min    Equipment Utilized During Treatment  Gait belt    Activity Tolerance  Patient tolerated treatment well    Behavior During Therapy  [facility] for tasks assessed/performed       Past Medical History:  Diagnosis Date  . Allergy   . Anemia 2012, 08/2015   chronic. Acute due to large hematoma 2013. 2017: due to gastric AVM bleeding.   . Anxiety   . CAD (coronary artery disease)    a. 06/2010 Cath: LM nl, LAD 50p/m, LCX 7m, RCA 50p/m -->catheter dissection but good flow, 70d, RPDA 50-70;  b. 06/2010 Relook cath due to bradycardia and inf ST elev: RCA dissection @ ostium extending into coronary cusp and prox RCA, Ao dissection-->less staining than previously-->Med Rx.  . Chronic combined systolic (congestive) and diastolic (congestive) heart failure (Eduardo [name])    a. 12/2014 Echo: EF 60-65%; b. 07/2016 Echo: EF 45-50%, mild LVH. Mild AS/MS. Mod-sev MR. Mild to mod TR. PASP 29mmHg; c. 08/2018 Echo: EF 55-60%, RVSP 68.81mmHg. Sev dil LA, mod dil RA. at least mod MR, mild to mod TR. Mild AS.  Eduardo [name] COPD (chronic obstructive pulmonary disease) ([place]) 2013   exertional dyspnea  . Gastric angiodysplasia with hemorrhage 08/2015  . Hx of colonic polyps 2001, 2011   adenomatous 2001. HP 2001, 2011.  Eduardo [name] Hyperlipidemia   . Mitral regurgitation    a. 07/2016 Echo: Mod to sev MR; b. 08/2018 Echo: at least moderate MR.  . Myocardial infarction (Eduardo [name]) 06/2010.  12/2011.   "twice; 1 day apart;  during/after cath". NQMI in setting severe anemia 2013.   Eduardo [name] Neuropathy 2014   in feet, likely due to spinal stenosis, DDD, HNP  . Osteoarthritis 2002   spinal stenosis, spondylolisthesis, disc protrusion multilevel in lumbar spine  . Permanent atrial fibrillation 2012   a.  CHA2DS2VASc = 6-->eliquis;  b. 12/2015 Echo: EF 60-65%, no rwma, LVH, mild AS/MS/MR, sev dil LA, mild TR, PASP 23mmHg.  . Stroke (Eduardo [name]) 2016  . Upper GI bleed 08/2015   EGD: 2 small gastric AVMs, 1 actively bleeding.  Both treated with APC ablation, clipping of the bleeder    Past Surgical History:  Procedure Laterality Date  . APPENDECTOMY    . CARDIAC CATHETERIZATION  2012   50% LAD and luminal irreg in RCA, 1/12  Post cath MI from damage  . CARPAL TUNNEL RELEASE     left  . CARPAL TUNNEL RELEASE  10/11/11   Dr Rush Barer  . CATARACT EXTRACTION W/ INTRAOCULAR LENS IMPLANT  2/13   Dr [name]  . COLONOSCOPY  2001, 02/2010   Dr Deatra Ina.   . ESOPHAGOGASTRODUODENOSCOPY N/A 08/15/2015   Eduardo [name]; 2 small gastric AVMs, 1 actively bleeding.  Both treated with APC ablation, clipping of the bleeder  . FOOT FRACTURE SURGERY     right heel repair  . FRACTURE  SURGERY    . KNEE ARTHROSCOPY     left, right knee 10/2009  . MASTOIDECTOMY     x3 on right  . NASAL SEPTUM SURGERY     nasal septal repair  . PENILE PROSTHESIS  REMOVAL    . PENILE PROSTHESIS IMPLANT     x 2--got infection after 2nd and had to remove  . PILONIDAL CYST / SINUS EXCISION    . SHOULDER ARTHROSCOPY     right  . TONSILLECTOMY AND ADENOIDECTOMY      There were no vitals filed for this visit.  Subjective Assessment - 01/29/19 1053    Subjective  Pt reports doing well today however his back pain has become more consistent. His pain is a 6/10 today and he reports that it has been keeping him from doing activities that he normally would do like walking. No questions or concerns at this time.    Patient is accompained by:  Family member     Pertinent History  Patinet has been using a rollator for a year and 1/2, and a cane for 4 years before that. He has been having PT for the last 4 years. He has had several falls.    How long can you stand comfortably?  15 mins    How long can you walk comfortably?  150 feet    Patient Stated Goals  Patient  wants to be stronger to be abel to stand and do activiites.    Currently in Pain?  Yes    Pain Score  6     Pain Location  Back    Pain Orientation  Lower    Pain Descriptors / Indicators  Aching;Sore    Pain Type  Chronic pain    Pain Onset  More than a month ago    Pain Frequency  Intermittent        TREATMENT CGA-minA for all activities, in // bars for safety    Therapeutic Exercise  NuStep L3 x5 minutes warmup during history  STS without UE support x12; verbal cueing to bring pelvis anteriorly, CGA-minA for safety, noticeable LE buckling due to weakness Seated heel/toe raises 1# 3s hold x10 each 4" lateral step ups x8 bilaterally; min-mod verbal cues for anterior pelvic weight shift, mod knee buckling throughout    Standing marches 2.5# AW x12 bilaterally Standing hip abduction 2.5# AW x12 bilaterally Standing hip extension 2.5# AW x12 bilaterally Standing HS curl 2.5# AW x12 bilaterally; verbal and tactile cueing for postural correction and to dec hip flexion  Seated LAQ 2.5#  3s hold x12 each LE  Standing toe raises x10 each Seated heel raises with manual resistance x10 each  Gait in // bars forward, retro and side stepping x5 lengths each direction in // bars; CGA with BUE support for steadying    Neuromuscular Re-education  Standing airex wide BOS EO x30s; min ankle strategy noted to due weakness, max knee strategy for steadying, minA for safety  Standing airex narrow stance EO 2x30s; mod ankle strategy, mod knee strategy noted  Standing aired wide BOS EC 2x30s; severe knee strategy noted, CGA-minA guard  Standing modified tandem and tandem stance x30s each; pt  demonstrates greater difficulty with L LE as rear LE, intermittent BUE support, tandem stance too difficult with frequent BUE support to steady  foam AP balance x30s           Pt educated throughout session about proper posture and technique with exercises. Improved exercise technique, movement at  target joints, use of target muscles after min to mod verbal, visual, tactile cues.      Pt demonstrates good motivation throughout therapy today. He presents to therapy today ambulating with rollator. Pt requires intermittent rest breaks throughout session due to significant LE muscle fatigue and SOB. He demonstrates significant LE weakness bilaterally, however, has greater difficulty with form when focusing on R LE exercise that pt relates to a PMH of stroke affecting his R side. Pt requires mod verbal and tactile cueing with lateral step ups for anterior pelvic weight shift, without cueing pt LOB posteriorly due to weak hip extensor moment. Pt requires verbal and tactile cueing for form throughout therex. He demonstrates dec anterior pelvic weight shift with sit to stands requiring verbal and tactile cueing to bring hips forward to prevent posterior LOB.  Pt is significantly challenged with balance activities when visual system is compromised and demonstrates significant knee strategy. He demonstrates very minimal ankle strategy during balance activities secondary to significant ankle weakness and relies heavily on knee/hip strategy for steadying. Pt will benefit from PT services to address deficits in strength, balance, and mobility in order to return to full function at home.             PT Education - 01/29/19 1054    Education Details  Exercise technique    Person(s) Educated  Patient    Methods  Explanation;Demonstration;Tactile cues;Verbal cues    Comprehension  Verbal cues required;Returned demonstration;Verbalized understanding;Tactile cues required       PT Short Term Goals -  12/31/18 1001      PT SHORT TERM GOAL #1   Title  Patient will be independent in home exercise program to improve strength/mobility for better functional independence with ADLs.    Time  4    Period  Weeks    Status  New    Target Date  01/29/19      PT SHORT TERM GOAL #2   Title  Patient (> 71 years old) will complete five times sit to stand test in < 15 seconds indicating an increased LE strength and improved balance.    Time  4    Period  Weeks    Status  New    Target Date  01/29/19        PT Long Term Goals - 12/31/18 1003      PT LONG TERM GOAL #1   Title  Patient will increase 10 meter walk test to >1.63m/s as to improve gait speed for better community ambulation and to reduce fall risk.    Time  8    Period  Weeks    Status  New    Target Date  02/26/19      PT LONG TERM GOAL #2   Title  Patient will reduce timed up and go to <11 seconds to reduce fall risk and demonstrate improved transfer/gait ability.    Time  8    Period  Weeks    Status  New    Target Date  02/26/19      PT LONG TERM GOAL #3   Title  Patient will increase BLE gross strength to 4+/5 as to improve functional strength for independent gait, increased standing tolerance and increased ADL ability.    Time  8    Period  Weeks    Status  New    Target Date  02/26/19            Plan - 01/29/19 1056  Clinical Impression Statement  Pt demonstrates good motivation throughout therapy today. He presents to therapy today ambulating with rollator. Pt requires intermittent rest breaks throughout session due to significant LE muscle fatigue and SOB. He demonstrates significant LE weakness bilaterally, however, has greater difficulty with form when focusing on R LE exercise that pt relates to a PMH of stroke affecting his R side. Pt requires mod verbal and tactile cueing with forward and lateral step ups for anterior pelvic weight shift, without cueing pt LOB posteriorly due to weak hip extensor moment.  Pt requires verbal and tactile cueing for form throughout therex. He demonstrates dec anterior pelvic weight shift with sit to stands requiring verbal and tactile cueing to bring hips forward to prevent posterior LOB.  Pt is significantly challenged with balance activities when visual system is compromised and demonstrates significant knee strategy. He demonstrates very minimal ankle strategy during balance activities secondary to significant ankle weakness and relies heavily on knee/hip strategy for steadying. Pt will benefit from PT services to address deficits in strength, balance, and mobility in order to return to full function at home.    Personal Factors and Comorbidities  Age    Examination-Activity Limitations  Locomotion Level;Stand;Transfers    Examination-Participation Restrictions  Driving;Community Activity    Stability/Clinical Decision Making  Stable/Uncomplicated    Rehab Potential  Good    PT Frequency  2x / week    PT Duration  8 weeks    PT Treatment/Interventions  Therapeutic exercise;Therapeutic activities;Balance training;Neuromuscular re-education;Patient/family education;Manual techniques    PT Next Visit Plan  gait in // bars, dynamic balance    Consulted and Agree with Plan of Care  Patient;Family member/caregiver       Patient will benefit from skilled therapeutic intervention in order to improve the following deficits and impairments:  Abnormal gait, Decreased activity tolerance, Decreased endurance, Decreased strength, Impaired flexibility, Difficulty walking, Decreased mobility, Cardiopulmonary status limiting activity  Visit Diagnosis: Muscle weakness (generalized)  Difficulty in walking, not elsewhere classified     Problem List Patient Active Problem List   Diagnosis Date Noted  . Dysuria 12/31/2017  . Constipation 12/31/2017  . CAD (coronary artery disease), native coronary artery 12/28/2017  . Bilateral carotid artery stenosis 12/04/2017  .  Laceration of right leg excluding thigh 10/23/2017  . Alcohol dependence in remission ([place]) 06/24/2017  . Imbalance 02/05/2017  . Mitral regurgitation 07/17/2016  . Dyslipidemia 07/14/2016  . Encounter for anticoagulation discussion and counseling 05/03/2015  . Chronic anticoagulation 03/01/2015  . 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 TIA (transient ischemic attack) 12/31/2014  . Advanced directives, counseling/discussion 04/19/2014  . Spinal stenosis, lumbar region, with neurogenic claudication 12/23/2012  . Osteoarthritis, multiple sites 10/08/2012  . Preventative health care 02/25/2012  . MI, old with prior NSTEMI x 2 12/28/2011  . Chronic diastolic HF (heart failure) ([place]) 12/28/2011    Class: Acute  . Edema 09/20/2010  . Allergic rhinitis 11/11/2008  . Mood disorder ([place]) 04/14/2008  . Essential hypertension 04/14/2008  . Chronic atrial fibrillation 04/14/2008  . COPD GOLD II B 04/14/2008   Juliann Pulse SPT  Juliann Pulse 01/29/2019, 11:56 AM  [place] MAIN [facility] SERVICES [address] [place], [place], [address] Phone: [phone]   Fax:  [phone]  Name: CORDARIUS BENNING MRN: [id number] Date of Birth: 20-Oct-1930

## 2019-02-03 ENCOUNTER — Encounter: Payer: Self-pay | Admitting: Physical Therapy

## 2019-02-03 ENCOUNTER — Other Ambulatory Visit: Payer: Self-pay

## 2019-02-03 ENCOUNTER — Ambulatory Visit: Payer: PPO | Admitting: Physical Therapy

## 2019-02-03 DIAGNOSIS — M6281 Muscle weakness (generalized): Secondary | ICD-10-CM | POA: Diagnosis not present

## 2019-02-03 DIAGNOSIS — R262 Difficulty in walking, not elsewhere classified: Secondary | ICD-10-CM

## 2019-02-03 NOTE — Patient Instructions (Signed)
Access Code: [email or web address]  URL: [email or web address]  Date: 02/03/2019  Prepared by: Blanche East   Exercises  CLX Ankle Dorsiflexion and Eversion - 10 reps - 2 sets - 1x daily - 7x weekly

## 2019-02-03 NOTE — Therapy (Signed)
[place] MAIN [facility] SERVICES [address] [place], [place], [address] Phone: [phone]   Fax:  [phone]  Physical Therapy Treatment  Patient Details  Name: Eduardo [name] MRN: [id number] Date of Birth: 1930-12-15 Referring Provider (PT): [name]   Encounter Date: 02/03/2019  PT End of Session - 02/03/19 1245    Visit Number  8    Number of Visits  17    Date for PT Re-Evaluation  02/25/19    PT Start Time  1146    PT Stop Time  1230    PT Time Calculation (min)  44 min    Equipment Utilized During Treatment  Gait belt    Activity Tolerance  Patient tolerated treatment well    Behavior During Therapy  [facility] for tasks assessed/performed       Past Medical History:  Diagnosis Date  . Allergy   . Anemia 2012, 08/2015   chronic. Acute due to large hematoma 2013. 2017: due to gastric AVM bleeding.   . Anxiety   . CAD (coronary artery disease)    a. 06/2010 Cath: LM nl, LAD 50p/m, LCX 18m, RCA 50p/m -->catheter dissection but good flow, 70d, RPDA 50-70;  b. 06/2010 Relook cath due to bradycardia and inf ST elev: RCA dissection @ ostium extending into coronary cusp and prox RCA, Ao dissection-->less staining than previously-->Med Rx.  . Chronic combined systolic (congestive) and diastolic (congestive) heart failure ([place])    a. 12/2014 Echo: EF 60-65%; b. 07/2016 Echo: EF 45-50%, mild LVH. Mild AS/MS. Mod-sev MR. Mild to mod TR. PASP 68mmHg; c. 08/2018 Echo: EF 55-60%, RVSP 68.63mmHg. Sev dil LA, mod dil RA. at least mod MR, mild to mod TR. Mild AS.  Marland Kitchen COPD (chronic obstructive pulmonary disease) ([place]) 2013   exertional dyspnea  . Gastric angiodysplasia with hemorrhage 08/2015  . Hx of colonic polyps 2001, 2011   adenomatous 2001. HP 2001, 2011.  Marland Kitchen Hyperlipidemia   . Mitral regurgitation    a. 07/2016 Echo: Mod to sev MR; b. 08/2018 Echo: at least moderate MR.  . Myocardial infarction ([place]) 06/2010.  12/2011.   "twice; 1 day apart;  during/after cath". NQMI in setting severe anemia 2013.   Marland Kitchen Neuropathy 2014   in feet, likely due to spinal stenosis, DDD, HNP  . Osteoarthritis 2002   spinal stenosis, spondylolisthesis, disc protrusion multilevel in lumbar spine  . Permanent atrial fibrillation 2012   a.  CHA2DS2VASc = 6-->eliquis;  b. 12/2015 Echo: EF 60-65%, no rwma, LVH, mild AS/MS/MR, sev dil LA, mild TR, PASP 67mmHg.  . Stroke ([place]) 2016  . Upper GI bleed 08/2015   EGD: 2 small gastric AVMs, 1 actively bleeding.  Both treated with APC ablation, clipping of the bleeder    Past Surgical History:  Procedure Laterality Date  . APPENDECTOMY    . CARDIAC CATHETERIZATION  2012   50% LAD and luminal irreg in RCA, 1/12  Post cath MI from damage  . CARPAL TUNNEL RELEASE     left  . CARPAL TUNNEL RELEASE  10/11/11   Dr Rush Barer  . CATARACT EXTRACTION W/ INTRAOCULAR LENS IMPLANT  2/13   Dr [name]  . COLONOSCOPY  2001, 02/2010   Dr Deatra Ina.   . ESOPHAGOGASTRODUODENOSCOPY N/A 08/15/2015   [name] MD; 2 small gastric AVMs, 1 actively bleeding.  Both treated with APC ablation, clipping of the bleeder  . FOOT FRACTURE SURGERY     right heel repair  . FRACTURE  SURGERY    . KNEE ARTHROSCOPY     left, right knee 10/2009  . MASTOIDECTOMY     x3 on right  . NASAL SEPTUM SURGERY     nasal septal repair  . PENILE PROSTHESIS  REMOVAL    . PENILE PROSTHESIS IMPLANT     x 2--got infection after 2nd and had to remove  . PILONIDAL CYST / SINUS EXCISION    . SHOULDER ARTHROSCOPY     right  . TONSILLECTOMY AND ADENOIDECTOMY      There were no vitals filed for this visit.  Subjective Assessment - 02/03/19 1156    Subjective  patient reports doing well; He reports less back pain today; However due to increased back pain has been unable to do strengthening exercises at home. Denies any new falls;    Patient is accompained by:  Family member    Pertinent History  Patinet has been using a rollator for a year and  1/2, and a cane for 4 years before that. He has been having PT for the last 4 years. He has had several falls.    How long can you stand comfortably?  15 mins    How long can you walk comfortably?  150 feet    Patient Stated Goals  Patient  wants to be stronger to be abel to stand and do activiites.    Currently in Pain?  No/denies    Pain Onset  More than a month ago            TREATMENT: Warm up with gait in hallway x500 feet with rollator with 1 seated rest break, HR 69 SPO2 95%, patient exhibits increased shortness of breath;   Instructed patient in LE strengthening: Standing with red tband around BLE: -Side stepping x12 feet x3 laps each direction; Patient required min-moderate verbal/tactile cues for correct exercise techniqueincluding to avoid hip ER with abduction for better hip strengthening; He was able to exhibit better trunk control with less compensation with exercise;  Required intermittent rail assist for safety;  Seated ankle EV red tband LLE x10 reps, yellow tband RLE x10 reps with mod VCs for proper positioning and to isolate ankle ROM for better strengthening; Advanced HEP, see patient instructions;   Sit<>Stand from regular chair holding ball with overhead press x10 reps;  Required min VCs for proper technique including to increase knee extension for better stance control;   Leg press: BLE plate 105# R48 reps with min VCs for proper positioning for optimal strengthening; Leg press, BLE heel raises 45# x10 with cues for proper positioning to isolate calf strengthening; Patient able to initiate partial ROM in BLE but does have difficulty achieving full PF due to calf weakness;    Patient instructed in advanced balance exercise  Standing in parallel bars:   Standing on airex foam: -alternate toe taps to 4 inch step with 1-0 rail assist x15reps bilaterally; -Standing one foot on airex, one foot on 4 inch step,unsupported, ball pass side/side  x5 reps each, each foot on step with min A for safety; -Side step airex to 4 inch step with 2-1 rail assist x5 reps each direction; Initially patient exhibits increased instability in right ankle with increased supination requiring cues to evert right foot for better foot flat; Required short seated rest break to reduce fatigue;   -modified tandem stance:head turns side/side, up/downx5 reps each foot in front; Required min-mod A for balance control, exhibiting increased left lateral lean;  Patient required min VCs for  balance stability, including to increase trunk control for less loss of balance with smaller base of support   Response to treatment: Patient often becomes short of breath, Vitals monitored, SPO2 96%, HR in high 80's, low 90's; multiple seated rest breaks offered for patient to catch breath due to dyspnea on exertion; Patient requires min VCs for correct exercise technique for optimum muscle activation and strengthening; Instructed patient in advanced balance tasks, utilizing compliant surfaces for better balance challenge; Patient does exhibit increased ankle instability especially while standing on compliant surfaces. Advanced HEP with ankle strengthening to improve ankle control;                         PT Education - 02/03/19 1244    Education Details  exercise technique    Person(s) Educated  Patient    Methods  Explanation;Verbal cues    Comprehension  Verbalized understanding;Returned demonstration;Verbal cues required;Need further instruction       PT Short Term Goals - 12/31/18 1001      PT SHORT TERM GOAL #1   Title  Patient will be independent in home exercise program to improve strength/mobility for better functional independence with ADLs.    Time  4    Period  Weeks    Status  New    Target Date  01/29/19      PT SHORT TERM GOAL #2   Title  Patient (> 61 years old) will complete five times sit to stand test in < 15 seconds  indicating an increased LE strength and improved balance.    Time  4    Period  Weeks    Status  New    Target Date  01/29/19        PT Long Term Goals - 12/31/18 1003      PT LONG TERM GOAL #1   Title  Patient will increase 10 meter walk test to >1.62m/s as to improve gait speed for better community ambulation and to reduce fall risk.    Time  8    Period  Weeks    Status  New    Target Date  02/26/19      PT LONG TERM GOAL #2   Title  Patient will reduce timed up and go to <11 seconds to reduce fall risk and demonstrate improved transfer/gait ability.    Time  8    Period  Weeks    Status  New    Target Date  02/26/19      PT LONG TERM GOAL #3   Title  Patient will increase BLE gross strength to 4+/5 as to improve functional strength for independent gait, increased standing tolerance and increased ADL ability.    Time  8    Period  Weeks    Status  New    Target Date  02/26/19            Plan - 02/03/19 1245    Clinical Impression Statement  Patient motivated and tolerated session well; Patient exhibits increased ankle instability while standing on compliant surfaces. Instructed patient in advanced LE ankle strengthening to improve stance control. Patient does require min Vcs for proper positioning and proper muscle activation. He does report mild fatigue at end of session. He would benefit from additional skilled PT intervention to improve strength, balance and gait safety;    Personal Factors and Comorbidities  Age    Examination-Activity Limitations  Locomotion Level;Stand;Transfers    Examination-Participation  Restrictions  Driving;Community Activity    Stability/Clinical Decision Making  Stable/Uncomplicated    Rehab Potential  Good    PT Frequency  2x / week    PT Duration  8 weeks    PT Treatment/Interventions  Therapeutic exercise;Therapeutic activities;Balance training;Neuromuscular re-education;Patient/family education;Manual techniques    PT Next Visit  Plan  gait in // bars, dynamic balance    Consulted and Agree with Plan of Care  Patient;Family member/caregiver       Patient will benefit from skilled therapeutic intervention in order to improve the following deficits and impairments:  Abnormal gait, Decreased activity tolerance, Decreased endurance, Decreased strength, Impaired flexibility, Difficulty walking, Decreased mobility, Cardiopulmonary status limiting activity  Visit Diagnosis: Muscle weakness (generalized)  Difficulty in walking, not elsewhere classified     Problem List Patient Active Problem List   Diagnosis Date Noted  . Dysuria 12/31/2017  . Constipation 12/31/2017  . CAD (coronary artery disease), native coronary artery 12/28/2017  . Bilateral carotid artery stenosis 12/04/2017  . Laceration of right leg excluding thigh 10/23/2017  . Alcohol dependence in remission ([place]) 06/24/2017  . Imbalance 02/05/2017  . Mitral regurgitation 07/17/2016  . Dyslipidemia 07/14/2016  . Encounter for anticoagulation discussion and counseling 05/03/2015  . Chronic anticoagulation 03/01/2015  . 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 TIA (transient ischemic attack) 12/31/2014  . Advanced directives, counseling/discussion 04/19/2014  . Spinal stenosis, lumbar region, with neurogenic claudication 12/23/2012  . Osteoarthritis, multiple sites 10/08/2012  . Preventative health care 02/25/2012  . MI, old with prior NSTEMI x 2 12/28/2011  . Chronic diastolic HF (heart failure) ([place]) 12/28/2011    Class: Acute  . Edema 09/20/2010  . Allergic rhinitis 11/11/2008  . Mood disorder ([place]) 04/14/2008  . Essential hypertension 04/14/2008  . Chronic atrial fibrillation 04/14/2008  . COPD GOLD II B 04/14/2008    Nimai Burbach PT, DPT 02/04/2019, 9:56 AM  [place] MAIN [facility] SERVICES [address] [place], [place], [address] Phone: [phone]   Fax:   [phone]  Name: Eduardo [name] MRN: [id number] Date of Birth: 07/07/1930

## 2019-02-05 ENCOUNTER — Encounter: Payer: Self-pay | Admitting: Physical Therapy

## 2019-02-05 ENCOUNTER — Other Ambulatory Visit: Payer: Self-pay

## 2019-02-05 ENCOUNTER — Ambulatory Visit: Payer: PPO | Admitting: Physical Therapy

## 2019-02-05 DIAGNOSIS — M6281 Muscle weakness (generalized): Secondary | ICD-10-CM

## 2019-02-05 DIAGNOSIS — R262 Difficulty in walking, not elsewhere classified: Secondary | ICD-10-CM

## 2019-02-05 NOTE — Therapy (Signed)
[place] MAIN [facility] SERVICES [address] [place], [place], [address] Phone: [phone]   Fax:  [phone]  Physical Therapy Treatment  Patient Details  Name: Eduardo [name] MRN: [id number] Date of Birth: 1930/10/25 Referring Provider (PT): [name]   Encounter Date: 02/05/2019  PT End of Session - 02/05/19 1356    Visit Number  9    Number of Visits  17    Date for PT Re-Evaluation  02/25/19    PT Start Time  1347    PT Stop Time  1430    PT Time Calculation (min)  43 min    Equipment Utilized During Treatment  Gait belt    Activity Tolerance  Patient tolerated treatment well    Behavior During Therapy  [facility] for tasks assessed/performed       Past Medical History:  Diagnosis Date  . Allergy   . Anemia 2012, 08/2015   chronic. Acute due to large hematoma 2013. 2017: due to gastric AVM bleeding.   . Anxiety   . CAD (coronary artery disease)    a. 06/2010 Cath: LM nl, LAD 50p/m, LCX 35m, RCA 50p/m -->catheter dissection but good flow, 70d, RPDA 50-70;  b. 06/2010 Relook cath due to bradycardia and inf ST elev: RCA dissection @ ostium extending into coronary cusp and prox RCA, Ao dissection-->less staining than previously-->Med Rx.  . Chronic combined systolic (congestive) and diastolic (congestive) heart failure ([place])    a. 12/2014 Echo: EF 60-65%; b. 07/2016 Echo: EF 45-50%, mild LVH. Mild AS/MS. Mod-sev MR. Mild to mod TR. PASP 10mmHg; c. 08/2018 Echo: EF 55-60%, RVSP 68.2mmHg. Sev dil LA, mod dil RA. at least mod MR, mild to mod TR. Mild AS.  Marland Kitchen COPD (chronic obstructive pulmonary disease) ([place]) 2013   exertional dyspnea  . Gastric angiodysplasia with hemorrhage 08/2015  . Hx of colonic polyps 2001, 2011   adenomatous 2001. HP 2001, 2011.  Marland Kitchen Hyperlipidemia   . Mitral regurgitation    a. 07/2016 Echo: Mod to sev MR; b. 08/2018 Echo: at least moderate MR.  . Myocardial infarction ([place]) 06/2010.  12/2011.   "twice; 1 day apart;  during/after cath". NQMI in setting severe anemia 2013.   Marland Kitchen Neuropathy 2014   in feet, likely due to spinal stenosis, DDD, HNP  . Osteoarthritis 2002   spinal stenosis, spondylolisthesis, disc protrusion multilevel in lumbar spine  . Permanent atrial fibrillation 2012   a.  CHA2DS2VASc = 6-->eliquis;  b. 12/2015 Echo: EF 60-65%, no rwma, LVH, mild AS/MS/MR, sev dil LA, mild TR, PASP 35mmHg.  . Stroke ([place]) 2016  . Upper GI bleed 08/2015   EGD: 2 small gastric AVMs, 1 actively bleeding.  Both treated with APC ablation, clipping of the bleeder    Past Surgical History:  Procedure Laterality Date  . APPENDECTOMY    . CARDIAC CATHETERIZATION  2012   50% LAD and luminal irreg in RCA, 1/12  Post cath MI from damage  . CARPAL TUNNEL RELEASE     left  . CARPAL TUNNEL RELEASE  10/11/11   Dr Rush Barer  . CATARACT EXTRACTION W/ INTRAOCULAR LENS IMPLANT  2/13   Dr [name]  . COLONOSCOPY  2001, 02/2010   Dr Deatra Ina.   . ESOPHAGOGASTRODUODENOSCOPY N/A 08/15/2015   [name] MD; 2 small gastric AVMs, 1 actively bleeding.  Both treated with APC ablation, clipping of the bleeder  . FOOT FRACTURE SURGERY     right heel repair  . FRACTURE  SURGERY    . KNEE ARTHROSCOPY     left, right knee 10/2009  . MASTOIDECTOMY     x3 on right  . NASAL SEPTUM SURGERY     nasal septal repair  . PENILE PROSTHESIS  REMOVAL    . PENILE PROSTHESIS IMPLANT     x 2--got infection after 2nd and had to remove  . PILONIDAL CYST / SINUS EXCISION    . SHOULDER ARTHROSCOPY     right  . TONSILLECTOMY AND ADENOIDECTOMY      There were no vitals filed for this visit.  Subjective Assessment - 02/05/19 1355    Subjective  Patient reports doing well; He reports less back pain and states that he was able to do his HEP;    Patient is accompained by:  Family member    Pertinent History  Patinet has been using a rollator for a year and 1/2, and a cane for 4 years before that. He has been having PT for the last 4  years. He has had several falls.    How long can you stand comfortably?  15 mins    How long can you walk comfortably?  150 feet    Patient Stated Goals  Patient  wants to be stronger to be abel to stand and do activiites.    Currently in Pain?  No/denies    Pain Onset  More than a month ago    Multiple Pain Sites  No         TREATMENT: Warm up with gait in hallway x500 feet with rollator with 1 seated rest break, HR 91 SPO2 97%, patient exhibits increased shortness of breath;    Seated ankle EV red tband LLE x15 reps, yellow tband RLE x15 reps with mod VCs for proper positioning and to isolate ankle ROM for better strengthening; Advanced HEP, see patient instructions;   Leg press: BLE plate 160#, 7E08 reps with min VCs for proper positioning for optimal strengthening; Leg press, BLE heel raises 45# x10 with cues for proper positioning to isolate calf strengthening; Patient able to initiate partial ROM in BLE but does have difficulty achieving full PF due to calf weakness;    Patient instructed in advanced balance exercise  Standingin parallel bars:  Standing on airex foam: -alternate toe taps to 4 inch step with 1-0rail assist x15reps bilaterally; -Standing one foot on airex, one foot on 4 inch step,head turns side/side x5 reps each foot on step with min VCs for proper weight shift and to improve upper trunk control for less loss of balance;  -modified tandem stance:unsupported eyes open/closed 10 sec hold x2 reps each foot in front;Required min-mod A for balance control, exhibiting increased left lateral lean;  Patient required min VCs for balance stability, including to increase trunk control for less loss of balance with smaller base of support   Response to treatment: Patient often becomes short of breath, Vitals monitored, SPO2 96%, HR in high 80's, low 90's; multiple seated rest breaks offered for patient to catch breath due to dyspnea on  exertion; Patient requires min VCs for correct exercise technique for optimum muscle activation and strengthening; Instructed patient in advanced balance tasks, utilizing compliant surfaces for better balance challenge; Patient does exhibit increased ankle instability especially while standing on compliant surfaces. Advanced HEP with ankle strengthening to improve ankle control;              PT Education - 02/05/19 1356    Education Details  exercise technique/HEP  Person(s) Educated  Patient    Methods  Explanation;Verbal cues    Comprehension  Verbalized understanding;Returned demonstration;Verbal cues required;Need further instruction       PT Short Term Goals - 12/31/18 1001      PT SHORT TERM GOAL #1   Title  Patient will be independent in home exercise program to improve strength/mobility for better functional independence with ADLs.    Time  4    Period  Weeks    Status  New    Target Date  01/29/19      PT SHORT TERM GOAL #2   Title  Patient (> 83 years old) will complete five times sit to stand test in < 15 seconds indicating an increased LE strength and improved balance.    Time  4    Period  Weeks    Status  New    Target Date  01/29/19        PT Long Term Goals - 12/31/18 1003      PT LONG TERM GOAL #1   Title  Patient will increase 10 meter walk test to >1.26m/s as to improve gait speed for better community ambulation and to reduce fall risk.    Time  8    Period  Weeks    Status  New    Target Date  02/26/19      PT LONG TERM GOAL #2   Title  Patient will reduce timed up and go to <11 seconds to reduce fall risk and demonstrate improved transfer/gait ability.    Time  8    Period  Weeks    Status  New    Target Date  02/26/19      PT LONG TERM GOAL #3   Title  Patient will increase BLE gross strength to 4+/5 as to improve functional strength for independent gait, increased standing tolerance and increased ADL ability.    Time  8    Period   Weeks    Status  New    Target Date  02/26/19            Plan - 02/05/19 1537    Clinical Impression Statement  Patient motivated and participated well within session;He does continue to fatigue with prolonged standing with increased shortness of breath requiring intermittent seated rest breaks. Patient did require short restroom break during session which limited skilled PT intervention. Reinforced HEP; Will address goals next session; He would benefit from additional skilled PT intervention to improve strength, balance and gait safety;    Personal Factors and Comorbidities  Age    Examination-Activity Limitations  Locomotion Level;Stand;Transfers    Examination-Participation Restrictions  Driving;Community Activity    Stability/Clinical Decision Making  Stable/Uncomplicated    Rehab Potential  Good    PT Frequency  2x / week    PT Duration  8 weeks    PT Treatment/Interventions  Therapeutic exercise;Therapeutic activities;Balance training;Neuromuscular re-education;Patient/family education;Manual techniques    PT Next Visit Plan  gait in // bars, dynamic balance    Consulted and Agree with Plan of Care  Patient;Family member/caregiver       Patient will benefit from skilled therapeutic intervention in order to improve the following deficits and impairments:  Abnormal gait, Decreased activity tolerance, Decreased endurance, Decreased strength, Impaired flexibility, Difficulty walking, Decreased mobility, Cardiopulmonary status limiting activity  Visit Diagnosis: Muscle weakness (generalized)  Difficulty in walking, not elsewhere classified     Problem List Patient Active Problem List   Diagnosis Date  Noted  . Dysuria 12/31/2017  . Constipation 12/31/2017  . CAD (coronary artery disease), native coronary artery 12/28/2017  . Bilateral carotid artery stenosis 12/04/2017  . Laceration of right leg excluding thigh 10/23/2017  . Alcohol dependence in remission ([place]) 06/24/2017   . Imbalance 02/05/2017  . Mitral regurgitation 07/17/2016  . Dyslipidemia 07/14/2016  . Encounter for anticoagulation discussion and counseling 05/03/2015  . Chronic anticoagulation 03/01/2015  . 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 TIA (transient ischemic attack) 12/31/2014  . Advanced directives, counseling/discussion 04/19/2014  . Spinal stenosis, lumbar region, with neurogenic claudication 12/23/2012  . Osteoarthritis, multiple sites 10/08/2012  . Preventative health care 02/25/2012  . MI, old with prior NSTEMI x 2 12/28/2011  . Chronic diastolic HF (heart failure) ([place]) 12/28/2011    Class: Acute  . Edema 09/20/2010  . Allergic rhinitis 11/11/2008  . Mood disorder ([place]) 04/14/2008  . Essential hypertension 04/14/2008  . Chronic atrial fibrillation 04/14/2008  . COPD GOLD II B 04/14/2008    Jahn Franchini PT, DPT 02/05/2019, 3:38 PM  [place] MAIN [facility] SERVICES [address] [place], [place], [address] Phone: [phone]   Fax:  [phone]  Name: Eduardo [name] MRN: [id number] Date of Birth: Jan 15, 1931

## 2019-02-09 MED ORDER — ALBUTEROL SULFATE (2.5 MG/3ML) 0.083% IN NEBU: INHALATION_SOLUTION | RESPIRATORY_TRACT | 2 refills | Status: AC

## 2019-02-10 ENCOUNTER — Encounter: Payer: Self-pay | Admitting: Physical Therapy

## 2019-02-10 ENCOUNTER — Other Ambulatory Visit: Payer: Self-pay

## 2019-02-10 ENCOUNTER — Ambulatory Visit: Payer: PPO | Attending: Internal Medicine | Admitting: Physical Therapy

## 2019-02-10 DIAGNOSIS — R262 Difficulty in walking, not elsewhere classified: Secondary | ICD-10-CM | POA: Insufficient documentation

## 2019-02-10 DIAGNOSIS — M6281 Muscle weakness (generalized): Secondary | ICD-10-CM | POA: Insufficient documentation

## 2019-02-10 NOTE — Therapy (Signed)
[place] MAIN [facility] SERVICES [address] [place], [place], [address] Phone: [phone]   Fax:  [phone]  Physical Therapy Treatment Physical Therapy Progress Note   Dates of reporting period 12/31/18  to   02/10/19   Patient Details  Name: Eduardo [name] MRN: [id number] Date of Birth: 05-20-1931 Referring Provider (PT): [name]   Encounter Date: 02/10/2019  PT End of Session - 02/10/19 0935    Visit Number  10    Number of Visits  17    Date for PT Re-Evaluation  02/25/19    PT Start Time  0935    PT Stop Time  1015    PT Time Calculation (min)  40 min    Equipment Utilized During Treatment  Gait belt    Activity Tolerance  Patient tolerated treatment well    Behavior During Therapy  WFL for tasks assessed/performed       Past Medical History:  Diagnosis Date  . Allergy   . Anemia 2012, 08/2015   chronic. Acute due to large hematoma 2013. 2017: due to gastric AVM bleeding.   . Anxiety   . CAD (coronary artery disease)    a. 06/2010 Cath: LM nl, LAD 50p/m, LCX 41m RCA 50p/m -->catheter dissection but good flow, 70d, RPDA 50-70;  b. 06/2010 Relook cath due to bradycardia and inf ST elev: RCA dissection @ ostium extending into coronary cusp and prox RCA, Ao dissection-->less staining than previously-->Med Rx.  . Chronic combined systolic (congestive) and diastolic (congestive) heart failure ([facility]    a. 12/2014 Echo: EF 60-65%; b. 07/2016 Echo: EF 45-50%, mild LVH. Mild AS/MS. Mod-sev MR. Mild to mod TR. PASP 582mg; c. 08/2018 Echo: EF 55-60%, RVSP 68.46m646m. Sev dil LA, mod dil RA. at least mod MR, mild to mod TR. Mild AS.  . CMarland KitchenPD (chronic obstructive pulmonary disease) ([id number]   exertional dyspnea  . Gastric angiodysplasia with hemorrhage 08/2015  . Hx of colonic polyps 2001, 2011   adenomatous 2001. HP 2001, 2011.  . HMarland Kitchenperlipidemia   . Mitral regurgitation    a. 07/2016 Echo: Mod to sev MR; b. 08/2018 Echo: at least  moderate MR.  . Myocardial infarction ([place]CC[place]/2012.  12/2011.   "twice; 1 day apart; during/after cath". NQMI in setting severe anemia 2013.   . NMarland Kitchenuropathy 2014   in feet, likely due to spinal stenosis, DDD, HNP  . Osteoarthritis 2002   spinal stenosis, spondylolisthesis, disc protrusion multilevel in lumbar spine  . Permanent atrial fibrillation 2012   a.  CHA2DS2VASc = 6-->eliquis;  b. 12/2015 Echo: EF 60-65%, no rwma, LVH, mild AS/MS/MR, sev dil LA, mild TR, PASP 11m57m  . Stroke (HCC)[place]16  . Upper GI bleed 08/2015   EGD: 2 small gastric AVMs, 1 actively bleeding.  Both treated with APC ablation, clipping of the bleeder    Past Surgical History:  Procedure Laterality Date  . APPENDECTOMY    . CARDIAC CATHETERIZATION  2012   50% LAD and luminal irreg in RCA, 1/12  Post cath MI from damage  . CARPAL TUNNEL RELEASE     left  . CARPAL TUNNEL RELEASE  10/11/11   Dr WhitRush BarerCATARACT EXTRACTION W/ INTRAOCULAR LENS IMPLANT  2/13   Dr EpesMontey HoraCOLONOSCOPY  2001, 02/2010   Dr KaplDeatra Ina. ESOPHAGOGASTRODUODENOSCOPY N/A 08/15/2015   CarlGatha Mayer 2 small gastric AVMs, 1 actively bleeding.  Both treated with APC ablation, clipping of  the bleeder  . FOOT FRACTURE SURGERY     right heel repair  . FRACTURE SURGERY    . KNEE ARTHROSCOPY     left, right knee 10/2009  . MASTOIDECTOMY     x3 on right  . NASAL SEPTUM SURGERY     nasal septal repair  . PENILE PROSTHESIS  REMOVAL    . PENILE PROSTHESIS IMPLANT     x 2--got infection after 2nd and had to remove  . PILONIDAL CYST / SINUS EXCISION    . SHOULDER ARTHROSCOPY     right  . TONSILLECTOMY AND ADENOIDECTOMY      There were no vitals filed for this visit.  Subjective Assessment - 02/10/19 0937    Subjective  Patient reports doing well; He reports working on [employer] well but needs a new band; denies any new falls;    Patient is accompained by:  Family member    Pertinent History  Patinet has been using a rollator  for a year and 1/2, and a cane for 4 years before that. He has been having PT for the last 4 years. He has had several falls.    How long can you stand comfortably?  15 mins    How long can you walk comfortably?  150 feet    Patient Stated Goals  Patient  wants to be stronger to be abel to stand and do activiites.    Currently in Pain?  Yes    Pain Score  3     Pain Location  Back    Pain Orientation  Lower    Pain Descriptors / Indicators  Aching;Sore    Pain Type  Chronic pain    Pain Onset  More than a month ago    Pain Frequency  Intermittent    Aggravating Factors   bending/stooping    Pain Relieving Factors  rest    Effect of Pain on Daily Activities  decreased activity tolerance;    Multiple Pain Sites  No          TREATMENT: Leg press: BLE plate 150# [id number] with moist heat to low back to help reduce soreness in low back; Patient requires min VCs for proper exercise technique;  Instructed patient in outcome measures to address goals:      OUTCOME MEASURES: TEST Outcome (12/31/18)  Outcome (02/10/19) Interpretation  5 times sit<>stand 16.07sec     15.32 sec without HHA >60 yo, >15 sec indicates increased risk for falls  10 meter walk test         . 77        m/s    0.95 m/s with rollator <1.0 m/s indicates increased risk for falls; limited community ambulator  Timed up and Go    12.64             sec   10.68 sec with rollator; 11.23 sec without assistive device;  <14 sec indicates increased risk for falls  6 minute walk test      320          Feet   400 feet with rollator 1000 feet is community ambulator       Patient instructed in advanced balance exercise  Standingin parallel bars:  Standing on airex foam: -Standing one foot on airex, one foot on 4 inch step,BUE wand flexion x5 reps each foot on step with mod VCs for proper weight shift/stance control;  -mini squat unsupported x10 reps; Patient required min VCs for balance stability,  including to increase trunk  control for less loss of balance with smaller base of support   Response to treatment: Patient often becomes short of breath, Vitals monitored, SPO2 96%, HR in high 80's, low 90's; multiple seated rest breaks offered for patient to catch breath due to dyspnea on exertion; Patient requires min VCs for correct exercise technique for optimum muscle activation and strengthening;Patient's condition has the potential to improve in response to therapy. Maximum improvement is yet to be obtained. The anticipated improvement is attainable and reasonable in a generally predictable time.  Patient reports adherence with HEP; he reports that he still gets unsteady but denies any new falls at home. He reports intermittent back pain which does affect his mobility;                           PT Education - 02/10/19 0940    Education Details  exercise technique/progress towards goals;    Person(s) Educated  Patient    Methods  Explanation;Verbal cues    Comprehension  Verbalized understanding;Returned demonstration;Verbal cues required;Need further instruction       PT Short Term Goals - 02/10/19 6629      PT SHORT TERM GOAL #1   Title  Patient will be independent in home exercise program to improve strength/mobility for better functional independence with ADLs.    Time  4    Period  Weeks    Status  Achieved    Target Date  01/29/19      PT SHORT TERM GOAL #2   Title  Patient (> 57 years old) will complete five times sit to stand test in < 15 seconds indicating an increased LE strength and improved balance.    Time  4    Period  Weeks    Status  Partially Met    Target Date  01/29/19        PT Long Term Goals - 02/10/19 0947      PT LONG TERM GOAL #1   Title  Patient will increase 10 meter walk test to >1.42ms as to improve gait speed for better community ambulation and to reduce fall risk.    Time  8    Period  Weeks    Status  Partially Met    Target Date   02/26/19      PT LONG TERM GOAL #2   Title  Patient will reduce timed up and go to <11 seconds to reduce fall risk and demonstrate improved transfer/gait ability.    Time  8    Period  Weeks    Status  Achieved    Target Date  02/26/19      PT LONG TERM GOAL #3   Title  Patient will increase BLE gross strength to 4+/5 as to improve functional strength for independent gait, increased standing tolerance and increased ADL ability.    Time  8    Period  Weeks    Status  Partially Met    Target Date  02/26/19            Plan - 02/10/19 1217    Clinical Impression Statement  Patient doing well; He exhibits some improvement in gait ability with improved speed and distance walking compared to initial evaluation. patient does continue to get short of breath especially with activity requiring short seated rest breaks. Vitals monitored with SPo2 in low to mid 90's. Patient denies any increase in back pain with  advanced exercise. he continues to have impared balance with decreased ankle/knee strategies. Patient exhibits increased weakness especially in LLE often buckling LLE with stance activities. He would benefit from additional skilled PT Intervention to address weakness, imbalance and gait instability;    Personal Factors and Comorbidities  Age    Examination-Activity Limitations  Locomotion Level;Stand;Transfers    Examination-Participation Restrictions  Driving;Community Activity    Stability/Clinical Decision Making  Stable/Uncomplicated    Rehab Potential  Good    PT Frequency  2x / week    PT Duration  8 weeks    PT Treatment/Interventions  Therapeutic exercise;Therapeutic activities;Balance training;Neuromuscular re-education;Patient/family education;Manual techniques    PT Next Visit Plan  gait in // bars, dynamic balance    Consulted and Agree with Plan of Care  Patient;Family member/caregiver       Patient will benefit from skilled therapeutic intervention in order to improve  the following deficits and impairments:  Abnormal gait, Decreased activity tolerance, Decreased endurance, Decreased strength, Impaired flexibility, Difficulty walking, Decreased mobility, Cardiopulmonary status limiting activity  Visit Diagnosis: Muscle weakness (generalized)  Difficulty in walking, not elsewhere classified     Problem List Patient Active Problem List   Diagnosis Date Noted  . Dysuria 12/31/2017  . Constipation 12/31/2017  . CAD (coronary artery disease), native coronary artery 12/28/2017  . Bilateral carotid artery stenosis 12/04/2017  . Laceration of right leg excluding thigh 10/23/2017  . Alcohol dependence in remission ([place]) 06/24/2017  . Imbalance 02/05/2017  . Mitral regurgitation 07/17/2016  . Dyslipidemia 07/14/2016  . Encounter for anticoagulation discussion and counseling 05/03/2015  . Chronic anticoagulation 03/01/2015  . 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 TIA (transient ischemic attack) 12/31/2014  . Advanced directives, counseling/discussion 04/19/2014  . Spinal stenosis, lumbar region, with neurogenic claudication 12/23/2012  . Osteoarthritis, multiple sites 10/08/2012  . Preventative health care 02/25/2012  . MI, old with prior NSTEMI x 2 12/28/2011  . Chronic diastolic HF (heart failure) ([place]) 12/28/2011    Class: Acute  . Edema 09/20/2010  . Allergic rhinitis 11/11/2008  . Mood disorder ([place]) 04/14/2008  . Essential hypertension 04/14/2008  . Chronic atrial fibrillation 04/14/2008  . COPD GOLD II B 04/14/2008    Mohamed Portlock PT, DPT 02/10/2019, 12:19 PM  [place] MAIN [facility] SERVICES [address] [place], [place], [address] Phone: [phone]   Fax:  [phone]  Name: Eduardo [name] MRN: [id number] Date of Birth: 05-10-1931

## 2019-02-10 NOTE — Telephone Encounter (Signed)
I have printed the form and placed it in Dr Alla German Inbox on his desk.

## 2019-02-10 NOTE — Telephone Encounter (Signed)
Form done No charge Find out if he just wants it mailed

## 2019-02-12 ENCOUNTER — Other Ambulatory Visit: Payer: Self-pay

## 2019-02-12 ENCOUNTER — Encounter: Payer: Self-pay | Admitting: Physical Therapy

## 2019-02-12 ENCOUNTER — Ambulatory Visit: Payer: PPO | Admitting: Physical Therapy

## 2019-02-12 DIAGNOSIS — M6281 Muscle weakness (generalized): Secondary | ICD-10-CM | POA: Diagnosis not present

## 2019-02-12 DIAGNOSIS — R262 Difficulty in walking, not elsewhere classified: Secondary | ICD-10-CM

## 2019-02-12 DIAGNOSIS — Z961 Presence of intraocular lens: Secondary | ICD-10-CM | POA: Diagnosis not present

## 2019-02-12 NOTE — Therapy (Signed)
[place] MAIN [facility] SERVICES [address] [place], [place], [address] Phone: [phone]   Fax:  [phone]  Physical Therapy Treatment  Patient Details  Name: Eduardo [name] MRN: [id number] Date of Birth: Jun 19, 1930 Referring Provider (PT): [name]   Encounter Date: 02/12/2019  PT End of Session - 02/12/19 0935    Visit Number  11    Number of Visits  17    Date for PT Re-Evaluation  02/25/19    PT Start Time  0932    PT Stop Time  1015    PT Time Calculation (min)  43 min    Equipment Utilized During Treatment  Gait belt    Activity Tolerance  Patient tolerated treatment well    Behavior During Therapy  [facility] for tasks assessed/performed       Past Medical History:  Diagnosis Date  . Allergy   . Anemia 2012, 08/2015   chronic. Acute due to large hematoma 2013. 2017: due to gastric AVM bleeding.   . Anxiety   . CAD (coronary artery disease)    a. 06/2010 Cath: LM nl, LAD 50p/m, LCX 97m RCA 50p/m -->catheter dissection but good flow, 70d, RPDA 50-70;  b. 06/2010 Relook cath due to bradycardia and inf ST elev: RCA dissection @ ostium extending into coronary cusp and prox RCA, Ao dissection-->less staining than previously-->Med Rx.  . Chronic combined systolic (congestive) and diastolic (congestive) heart failure ([place]    a. 12/2014 Echo: EF 60-65%; b. 07/2016 Echo: EF 45-50%, mild LVH. Mild AS/MS. Mod-sev MR. Mild to mod TR. PASP 515mg; c. 08/2018 Echo: EF 55-60%, RVSP 68.65m65m. Sev dil LA, mod dil RA. at least mod MR, mild to mod TR. Mild AS.  . CMarland KitchenPD (chronic obstructive pulmonary disease) ([id number]   exertional dyspnea  . Gastric angiodysplasia with hemorrhage 08/2015  . Hx of colonic polyps 2001, 2011   adenomatous 2001. HP 2001, 2011.  . HMarland Kitchenperlipidemia   . Mitral regurgitation    a. 07/2016 Echo: Mod to sev MR; b. 08/2018 Echo: at least moderate MR.  . Myocardial infarction (HCCC[place]/2012.  12/2011.   "twice; 1 day apart;  during/after cath". NQMI in setting severe anemia 2013.   . NMarland Kitchenuropathy 2014   in feet, likely due to spinal stenosis, DDD, HNP  . Osteoarthritis 2002   spinal stenosis, spondylolisthesis, disc protrusion multilevel in lumbar spine  . Permanent atrial fibrillation 2012   a.  CHA2DS2VASc = 6-->eliquis;  b. 12/2015 Echo: EF 60-65%, no rwma, LVH, mild AS/MS/MR, sev dil LA, mild TR, PASP 41m61m  . Stroke (HCC)[place]16  . Upper GI bleed 08/2015   EGD: 2 small gastric AVMs, 1 actively bleeding.  Both treated with APC ablation, clipping of the bleeder    Past Surgical History:  Procedure Laterality Date  . APPENDECTOMY    . CARDIAC CATHETERIZATION  2012   50% LAD and luminal irreg in RCA, 1/12  Post cath MI from damage  . CARPAL TUNNEL RELEASE     left  . CARPAL TUNNEL RELEASE  10/11/11   Dr WhitRush BarerCATARACT EXTRACTION W/ INTRAOCULAR LENS IMPLANT  2/13   Dr EpesMontey HoraCOLONOSCOPY  2001, 02/2010   Dr KaplDeatra Ina. ESOPHAGOGASTRODUODENOSCOPY N/A 08/15/2015   CarlGatha Mayer 2 small gastric AVMs, 1 actively bleeding.  Both treated with APC ablation, clipping of the bleeder  . FOOT FRACTURE SURGERY     right heel repair  . FRACTURE  SURGERY    . KNEE ARTHROSCOPY     left, right knee 10/2009  . MASTOIDECTOMY     x3 on right  . NASAL SEPTUM SURGERY     nasal septal repair  . PENILE PROSTHESIS  REMOVAL    . PENILE PROSTHESIS IMPLANT     x 2--got infection after 2nd and had to remove  . PILONIDAL CYST / SINUS EXCISION    . SHOULDER ARTHROSCOPY     right  . TONSILLECTOMY AND ADENOIDECTOMY      There were no vitals filed for this visit.  Subjective Assessment - 02/12/19 0934    Subjective  Patient reports slight soreness in back today; Otherwise he reports doing well; Denies any new falls;    Patient is accompained by:  Family member    Pertinent History  Patinet has been using a rollator for 83, 1/2, and a cane for 4 years before that. He has been having PT for the  last 4 years. He has had several falls.    How long can you stand comfortably?  15 mins    How long can you walk comfortably?  150 feet    Patient Stated Goals  Patient  wants to be stronger to be abel to stand and do activiites.    Currently in Pain?  Yes    Pain Score  2     Pain Location  Back    Pain Orientation  Lower    Pain Descriptors / Indicators  Aching;Sore    Pain Type  Chronic pain    Pain Onset  More than a month ago    Pain Frequency  Intermittent    Aggravating Factors   bending/stooping    Pain Relieving Factors  rest    Effect of Pain on Daily Activities  decreased activity tolerance;    Multiple Pain Sites  No             TREATMENT: Warm upwith gait in hallway x320 feet with rollator with 1 seated rest break, HR 91 SPO2 95%, patient exhibits increased shortness of breath;    Seated ankle EV red tband LLE x20 reps, yellow tband RLE x20 reps with mod VCs for proper positioning and to isolate ankle ROM for better strengthening;  PT advanced HEP with standing tband exercise, Standing with green tband around BLE: -hip abduction x10 reps bilaterally; -hip extension x10 reps bilaterally; -hip flexion SLR x10 reps bilaterally; -Side stepping x12 feet x2 laps each direction; Patient required min-moderate verbal/tactile cues for correct exercise techniqueincluding to avoid hip ER with abduction for better hip strengthening; He was able to exhibit better trunk control with less compensation with exercise;  Required intermittent rail assist for safety;    Patient instructed in advanced balance exercise  Standingin parallel bars:  Standing on airex foam: -alternate toe taps to 4 inch step with 1-0rail assist x15reps bilaterally; -Standing one foot on airex, one foot on 4 inch step,BUE ball pass side/side x5 reps each with min A for safety and mod VCs to improve weight shift for better stance control;  -modified tandem stance:head turns  side/side x5 reps each foot on step with min VCs for proper weight shift and to improve upper trunk control for less loss of balance; -feet apart, unsupported mini squat x10 reps with min A for safety and cues to increase push through BLE for better dynamic balance;  -Side step from airex to 4inch step with 2-1 rail assist x5 reps each  direction with min A for safety; Patient required mod VCs to improve weight shift and flatten foot for less ankle instability to improve stance control;  Patient required min VCs for balance stability, including to increase trunk control for less loss of balance with smaller base of support  Standing on 1/2 foam: (Flat side up) -heel/toe rocks with feet apart heel/toe rocks x15 with rail assist for safety -feet apart, BUE wand flexion x10 reps with CGA for safety and cues to improve ankle strategies for better stance control -tandem stance with 2-0 rail assist 10 sec hold x2 reps each foot in front with CGA to min A for safety and cues to improve erect posture and increase weight shift for better stance control     Response to treatment: Patient often becomes short of breath, Vitals monitored, SPO2 95%, HR in high 80's, low 90's; multiple seated rest breaks offered for patient to catch breath due to dyspnea on exertion; Patient requires min VCs for correct exercise technique for optimum muscle activation and strengthening; Instructed patient in advanced balance tasks, utilizing compliant surfaces for better balance challenge; Patient does exhibit increased ankle instability especially while standing on compliant surfaces.                   PT Education - 02/12/19 0935    Education Details  exercise technique/HEP    Person(s) Educated  Patient    Methods  Explanation;Verbal cues    Comprehension  Verbalized understanding;Returned demonstration;Verbal cues required;Need further instruction       PT Short Term Goals - 02/10/19 0942      PT  SHORT TERM GOAL #1   Title  Patient will be independent in home exercise program to improve strength/mobility for better functional independence with ADLs.    Time  4    Period  Weeks    Status  Achieved    Target Date  01/29/19      PT SHORT TERM GOAL #2   Title  Patient (> 83 years old) will complete five times sit to stand test in < 15 seconds indicating an increased LE strength and improved balance.    Time  4    Period  Weeks    Status  Partially Met    Target Date  01/29/19        PT Long Term Goals - 02/10/19 0947      PT LONG TERM GOAL #1   Title  Patient will increase 10 meter walk test to >1.48ms as to improve gait speed for better community ambulation and to reduce fall risk.    Time  8    Period  Weeks    Status  Partially Met    Target Date  02/26/19      PT LONG TERM GOAL #2   Title  Patient will reduce timed up and go to <11 seconds to reduce fall risk and demonstrate improved transfer/gait ability.    Time  8    Period  Weeks    Status  Achieved    Target Date  02/26/19      PT LONG TERM GOAL #3   Title  Patient will increase BLE gross strength to 4+/5 as to improve functional strength for independent gait, increased standing tolerance and increased ADL ability.    Time  8    Period  Weeks    Status  Partially Met    Target Date  02/26/19  Plan - 02/12/19 0945    Clinical Impression Statement  Patient motivated and participated well within session; instructed patient in advanced LE strengthening to improve hip and ankle control for better balance. Patient continues to require min Vcs for proper exercise technique; He was instructed in advanced balance tasks, utilizing compliant surfaces to challenge stance control and ankle stability; Patient continues to require min Vcs for better weight shift for improved stance control. He requires multiple seated rest breaks due to shortness of breath/fatigue. he would benefit from additional skilled PT  intervention to improve strength, balance and gait safety;    Personal Factors and Comorbidities  Age    Examination-Activity Limitations  Locomotion Level;Stand;Transfers    Examination-Participation Restrictions  Driving;Community Activity    Stability/Clinical Decision Making  Stable/Uncomplicated    Rehab Potential  Good    PT Frequency  2x / week    PT Duration  8 weeks    PT Treatment/Interventions  Therapeutic exercise;Therapeutic activities;Balance training;Neuromuscular re-education;Patient/family education;Manual techniques    PT Next Visit Plan  gait in // bars, dynamic balance    Consulted and Agree with Plan of Care  Patient;Family member/caregiver       Patient will benefit from skilled therapeutic intervention in order to improve the following deficits and impairments:  Abnormal gait, Decreased activity tolerance, Decreased endurance, Decreased strength, Impaired flexibility, Difficulty walking, Decreased mobility, Cardiopulmonary status limiting activity  Visit Diagnosis: Muscle weakness (generalized)  Difficulty in walking, not elsewhere classified     Problem List Patient Active Problem List   Diagnosis Date Noted  . Dysuria 12/31/2017  . Constipation 12/31/2017  . CAD (coronary artery disease), native coronary artery 12/28/2017  . Bilateral carotid artery stenosis 12/04/2017  . Laceration of right leg excluding thigh 10/23/2017  . Alcohol dependence in remission ([place]) 06/24/2017  . Imbalance 02/05/2017  . Mitral regurgitation 07/17/2016  . Dyslipidemia 07/14/2016  . Encounter for anticoagulation discussion and counseling 05/03/2015  . Chronic anticoagulation 03/01/2015  . 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 TIA (transient ischemic attack) 12/31/2014  . Advanced directives, counseling/discussion 04/19/2014  . Spinal stenosis, lumbar region, with neurogenic claudication 12/23/2012  . Osteoarthritis, multiple sites 10/08/2012  .  Preventative health care 02/25/2012  . MI, old with prior NSTEMI x 2 12/28/2011  . Chronic diastolic HF (heart failure) ([place]) 12/28/2011    Class: Acute  . Edema 09/20/2010  . Allergic rhinitis 11/11/2008  . Mood disorder ([place]) 04/14/2008  . Essential hypertension 04/14/2008  . Chronic atrial fibrillation 04/14/2008  . COPD GOLD II B 04/14/2008    , PT, DPT 02/12/2019, 9:55 AM  [place] MAIN [facility] SERVICES [address] [place], [place], [address] Phone: [phone]   Fax:  [phone]  Name: ARCHIBALD MARCHETTA MRN: [id number] Date of Birth: 11-10-1930

## 2019-02-17 ENCOUNTER — Encounter: Payer: Self-pay | Admitting: Physical Therapy

## 2019-02-17 ENCOUNTER — Ambulatory Visit: Payer: PPO | Admitting: Physical Therapy

## 2019-02-17 ENCOUNTER — Other Ambulatory Visit: Payer: Self-pay

## 2019-02-17 DIAGNOSIS — R262 Difficulty in walking, not elsewhere classified: Secondary | ICD-10-CM

## 2019-02-17 DIAGNOSIS — M6281 Muscle weakness (generalized): Secondary | ICD-10-CM | POA: Diagnosis not present

## 2019-02-17 NOTE — Therapy (Signed)
[place] MAIN [facility] SERVICES [address] [place], [place], [address] Phone: [phone]   Fax:  [phone]  Physical Therapy Treatment  Patient Details  Name: Eduardo [name] MRN: [id number] Date of Birth: 1931-03-17 Referring Provider (PT): [name]   Encounter Date: 02/17/2019  PT End of Session - 02/17/19 0950    Visit Number  12    Number of Visits  17    Date for PT Re-Evaluation  02/25/19    PT Start Time  0935    PT Stop Time  1015    PT Time Calculation (min)  40 min    Equipment Utilized During Treatment  Gait belt    Activity Tolerance  Patient tolerated treatment well    Behavior During Therapy  [facility] for tasks assessed/performed       Past Medical History:  Diagnosis Date  . Allergy   . Anemia 2012, 08/2015   chronic. Acute due to large hematoma 2013. 2017: due to gastric AVM bleeding.   . Anxiety   . CAD (coronary artery disease)    a. 06/2010 Cath: LM nl, LAD 50p/m, LCX 75m RCA 50p/m -->catheter dissection but good flow, 70d, RPDA 50-70;  b. 06/2010 Relook cath due to bradycardia and inf ST elev: RCA dissection @ ostium extending into coronary cusp and prox RCA, Ao dissection-->less staining than previously-->Med Rx.  . Chronic combined systolic (congestive) and diastolic (congestive) heart failure ([place]    a. 12/2014 Echo: EF 60-65%; b. 07/2016 Echo: EF 45-50%, mild LVH. Mild AS/MS. Mod-sev MR. Mild to mod TR. PASP 5743mg; c. 08/2018 Echo: EF 55-60%, RVSP 68.43m44m. Sev dil LA, mod dil RA. at least mod MR, mild to mod TR. Mild AS.  . CMarland KitchenPD (chronic obstructive pulmonary disease) ([id number]   exertional dyspnea  . Gastric angiodysplasia with hemorrhage 08/2015  . Hx of colonic polyps 2001, 2011   adenomatous 2001. HP 2001, 2011.  . HMarland Kitchenperlipidemia   . Mitral regurgitation    a. 07/2016 Echo: Mod to sev MR; b. 08/2018 Echo: at least moderate MR.  . Myocardial infarction (HCCShel[place]/2012.  12/2011.   "twice; 1 day apart;  during/after cath". NQMI in setting severe anemia 2013.   . NMarland Kitchenuropathy 2014   in feet, likely due to spinal stenosis, DDD, HNP  . Osteoarthritis 2002   spinal stenosis, spondylolisthesis, disc protrusion multilevel in lumbar spine  . Permanent atrial fibrillation 2012   a.  CHA2DS2VASc = 6-->eliquis;  b. 12/2015 Echo: EF 60-65%, no rwma, LVH, mild AS/MS/MR, sev dil LA, mild TR, PASP 86m68m  . Stroke (HCC)[place]16  . Upper GI bleed 08/2015   EGD: 2 small gastric AVMs, 1 actively bleeding.  Both treated with APC ablation, clipping of the bleeder    Past Surgical History:  Procedure Laterality Date  . APPENDECTOMY    . CARDIAC CATHETERIZATION  2012   50% LAD and luminal irreg in RCA, 1/12  Post cath MI from damage  . CARPAL TUNNEL RELEASE     left  . CARPAL TUNNEL RELEASE  10/11/11   Dr WhitRush BarerCATARACT EXTRACTION W/ INTRAOCULAR LENS IMPLANT  2/13   Dr EpesMontey HoraCOLONOSCOPY  2001, 02/2010   Dr KaplDeatra Ina. ESOPHAGOGASTRODUODENOSCOPY N/A 08/15/2015   CarlGatha Mayer 2 small gastric AVMs, 1 actively bleeding.  Both treated with APC ablation, clipping of the bleeder  . FOOT FRACTURE SURGERY     right heel repair  . FRACTURE  SURGERY    . KNEE ARTHROSCOPY     left, right knee 10/2009  . MASTOIDECTOMY     x3 on right  . NASAL SEPTUM SURGERY     nasal septal repair  . PENILE PROSTHESIS  REMOVAL    . PENILE PROSTHESIS IMPLANT     x 2--got infection after 2nd and had to remove  . PILONIDAL CYST / SINUS EXCISION    . SHOULDER ARTHROSCOPY     right  . TONSILLECTOMY AND ADENOIDECTOMY      There were no vitals filed for this visit.  Subjective Assessment - 02/17/19 0948    Subjective  Patient reports doing well; Reports some soreness in his back but states that it is a little better today; He denies any new falls;    Patient is accompained by:  Family member    Pertinent History  Patinet has been using a rollator for a year and 1/2, and a cane for 4 years before that. He  has been having PT for the last 4 years. He has had several falls.    How long can you stand comfortably?  15 mins    How long can you walk comfortably?  150 feet    Patient Stated Goals  Patient  wants to be stronger to be abel to stand and do activiites.    Currently in Pain?  Yes    Pain Score  4     Pain Location  Back    Pain Orientation  Lower    Pain Descriptors / Indicators  Aching;Sore    Pain Type  Chronic pain    Pain Onset  More than a month ago    Pain Frequency  Intermittent    Aggravating Factors   bending/stooping    Pain Relieving Factors  rest    Effect of Pain on Daily Activities  decreased activity tolerance;    Multiple Pain Sites  No            TREATMENT: Warm upwith gait in hallway x320 feet with rollator with 1 seated rest break, [id number] 97%, patient exhibits increased shortness of breath;    Seated ankle DF red tband x20 reps bilaterally with min VCs for proper positioning and to isolate ankle ROM for better strengthening;  Leg press: BLE plate 150# 2Q33 with min Vcs to slow down LE movement for better motor control;   Patient instructed in advanced balance exercise  Standingin parallel bars:  Standing on airex foam: -alternate toe taps to 4 inch step with 1-0rail assist x15reps bilaterally; -Standing one foot on airex, one foot on 4 inch step,BUE ball pass side/side x5 reps each with min A for safety and mod VCs to improve weight shift for better stance control;  -standing feet apart, unsupported for 45 sec with CGA with occasional knee buckle and forward loss of balance, often reaching out for parallel bars for balance control;  -modified tandem stance:head turns side/side x10 reps each foot on step with min VCs for proper weight shift and to improve upper trunk control for less loss of balance; -feet together, balloon taps x60 sec with intermittent rail assist; required min A for safety and mod VCs for proper positioning;  patient often buckles knees and has posterior loss of balance having difficulty with weight shift and ankle strategies due to weakness and unsteadiness;  Standing on 1/2 foam: (Flat side up) -heel/toe rocks with feet apart heel/toe rocks x15 with rail assist for safety -feet apart, BUE wand  flexion x10 reps with CGA for safety and cues to improve ankle strategies for better stance control -tandem stance with 2-0 rail assist 10 sec hold x2 reps each foot in front with CGA to min A for safety and cues to improve erect posture and increase weight shift for better stance control     Response to treatment: Patient often becomes short of breath, Vitals monitored, SPO2 95%, HR in high 80's, low 90's; multiple seated rest breaks offered for patient to catch breath due to dyspnea on exertion; Patient requires min VCs for correct exercise technique for optimum muscle activation and strengthening; Instructed patient in advanced balance tasks, utilizing compliant surfaces for better balance challenge; Patient does exhibit increased ankle instability especially while standing on compliant surfaces.                                      PT Education - 02/17/19 0949    Education Details  exercise technique/balance;    Person(s) Educated  Patient    Methods  Explanation;Verbal cues    Comprehension  Verbalized understanding;Returned demonstration;Verbal cues required;Need further instruction       PT Short Term Goals - 02/10/19 0942      PT SHORT TERM GOAL #1   Title  Patient will be independent in home exercise program to improve strength/mobility for better functional independence with ADLs.    Time  4    Period  Weeks    Status  Achieved    Target Date  01/29/19      PT SHORT TERM GOAL #2   Title  Patient (> 42 years old) will complete five times sit to stand test in < 15 seconds indicating an increased LE strength and improved balance.    Time  4     Period  Weeks    Status  Partially Met    Target Date  01/29/19        PT Long Term Goals - 02/10/19 0947      PT LONG TERM GOAL #1   Title  Patient will increase 10 meter walk test to >1.36ms as to improve gait speed for better community ambulation and to reduce fall risk.    Time  8    Period  Weeks    Status  Partially Met    Target Date  02/26/19      PT LONG TERM GOAL #2   Title  Patient will reduce timed up and go to <11 seconds to reduce fall risk and demonstrate improved transfer/gait ability.    Time  8    Period  Weeks    Status  Achieved    Target Date  02/26/19      PT LONG TERM GOAL #3   Title  Patient will increase BLE gross strength to 4+/5 as to improve functional strength for independent gait, increased standing tolerance and increased ADL ability.    Time  8    Period  Weeks    Status  Partially Met    Target Date  02/26/19            Plan - 02/17/19 1144    Clinical Impression Statement  Patient motivated and participated well within session; he continues to have significant weakness in BLE with decreased ankle stabilization especially on compliant surfaces. Patient often utilizes knee flexion for balance strategies due to poor ankle control. He requires min  A for balance when standing unsupported especially when on unstable surfaces. He would benefit from additional skilled PT intervention to improve strength, balance and gait safety;    Personal Factors and Comorbidities  Age    Examination-Activity Limitations  Locomotion Level;Stand;Transfers    Examination-Participation Restrictions  Driving;Community Activity    Stability/Clinical Decision Making  Stable/Uncomplicated    Rehab Potential  Good    PT Frequency  2x / week    PT Duration  8 weeks    PT Treatment/Interventions  Therapeutic exercise;Therapeutic activities;Balance training;Neuromuscular re-education;Patient/family education;Manual techniques    PT Next Visit Plan  gait in // bars,  dynamic balance    Consulted and Agree with Plan of Care  Patient;Family member/caregiver       Patient will benefit from skilled therapeutic intervention in order to improve the following deficits and impairments:  Abnormal gait, Decreased activity tolerance, Decreased endurance, Decreased strength, Impaired flexibility, Difficulty walking, Decreased mobility, Cardiopulmonary status limiting activity  Visit Diagnosis: Muscle weakness (generalized)  Difficulty in walking, not elsewhere classified     Problem List Patient Active Problem List   Diagnosis Date Noted  . Dysuria 12/31/2017  . Constipation 12/31/2017  . CAD (coronary artery disease), native coronary artery 12/28/2017  . Bilateral carotid artery stenosis 12/04/2017  . Laceration of right leg excluding thigh 10/23/2017  . Alcohol dependence in remission ([place]) 06/24/2017  . Imbalance 02/05/2017  . Mitral regurgitation 07/17/2016  . Dyslipidemia 07/14/2016  . Encounter for anticoagulation discussion and counseling 05/03/2015  . Chronic anticoagulation 03/01/2015  . 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 TIA (transient ischemic attack) 12/31/2014  . Advanced directives, counseling/discussion 04/19/2014  . Spinal stenosis, lumbar region, with neurogenic claudication 12/23/2012  . Osteoarthritis, multiple sites 10/08/2012  . Preventative health care 02/25/2012  . MI, old with prior NSTEMI x 2 12/28/2011  . Chronic diastolic HF (heart failure) ([place]) 12/28/2011    Class: Acute  . Edema 09/20/2010  . Allergic rhinitis 11/11/2008  . Mood disorder ([place]) 04/14/2008  . Essential hypertension 04/14/2008  . Chronic atrial fibrillation 04/14/2008  . COPD GOLD II B 04/14/2008    Jacquese Cassarino PT, DPT 02/17/2019, 11:47 AM  [place] MAIN [facility] SERVICES [address] [place], [place], [address] Phone: [phone]   Fax:  [phone]  Name: Eduardo [name] MRN: [id number] Date of Birth: July 19, 1930

## 2019-02-19 ENCOUNTER — Other Ambulatory Visit: Payer: Self-pay

## 2019-02-19 ENCOUNTER — Encounter: Payer: Self-pay | Admitting: Physical Therapy

## 2019-02-19 ENCOUNTER — Ambulatory Visit: Payer: PPO | Admitting: Physical Therapy

## 2019-02-19 DIAGNOSIS — M6281 Muscle weakness (generalized): Secondary | ICD-10-CM

## 2019-02-19 DIAGNOSIS — R262 Difficulty in walking, not elsewhere classified: Secondary | ICD-10-CM

## 2019-02-19 NOTE — Therapy (Signed)
[place] MAIN [facility] SERVICES [address] [place], [place], [address] Phone: [phone]   Fax:  [phone]  Physical Therapy Treatment  Patient Details  Name: Eduardo [name] MRN: [id number] Date of Birth: [birth year] Referring Provider (PT): [name]   Encounter Date: 02/19/2019  PT End of Session - 02/19/19 0950    Visit Number  13    Number of Visits  17    Date for PT Re-Evaluation  02/25/19    PT Start Time  0932    PT Stop Time  1015    PT Time Calculation (min)  43 min    Equipment Utilized During Treatment  Gait belt    Activity Tolerance  Patient tolerated treatment well    Behavior During Therapy  [facility] for tasks assessed/performed       Past Medical History:  Diagnosis Date  . Allergy   . Anemia 2012, 08/2015   chronic. Acute due to large hematoma 2013. 2017: due to gastric AVM bleeding.   . Anxiety   . CAD (coronary artery disease)    a. 06/2010 Cath: LM nl, LAD 50p/m, LCX 39m RCA 50p/m -->catheter dissection but good flow, 70d, RPDA 50-70;  b. 06/2010 Relook cath due to bradycardia and inf ST elev: RCA dissection @ ostium extending into coronary cusp and prox RCA, Ao dissection-->less staining than previously-->Med Rx.  . Chronic combined systolic (congestive) and diastolic (congestive) heart failure ([place]    a. 12/2014 Echo: EF 60-65%; b. 07/2016 Echo: EF 45-50%, mild LVH. Mild AS/MS. Mod-sev MR. Mild to mod TR. PASP 550mg; c. 08/2018 Echo: EF 55-60%, RVSP 68.81m281m. Sev dil LA, mod dil RA. at least mod MR, mild to mod TR. Mild AS.  . CMarland KitchenPD (chronic obstructive pulmonary disease) ([id number]   exertional dyspnea  . Gastric angiodysplasia with hemorrhage 08/2015  . Hx of colonic polyps 2001, 2011   adenomatous 2001. HP 2001, 2011.  . HMarland Kitchenperlipidemia   . Mitral regurgitation    a. 07/2016 Echo: Mod to sev MR; b. 08/2018 Echo: at least moderate MR.  . Myocardial infarction (HCCWinona Lake/2012.  12/2011.   "twice; 1 day apart;  during/after cath". NQMI in setting severe anemia 2013.   . NMarland Kitchenuropathy 2014   in feet, likely due to spinal stenosis, DDD, HNP  . Osteoarthritis 2002   spinal stenosis, spondylolisthesis, disc protrusion multilevel in lumbar spine  . Permanent atrial fibrillation 2012   a.  CHA2DS2VASc = 6-->eliquis;  b. 12/2015 Echo: EF 60-65%, no rwma, LVH, mild AS/MS/MR, sev dil LA, mild TR, PASP 70m70m  . Stroke (HCC)[place]16  . Upper GI bleed 08/2015   EGD: 2 small gastric AVMs, 1 actively bleeding.  Both treated with APC ablation, clipping of the bleeder    Past Surgical History:  Procedure Laterality Date  . APPENDECTOMY    . CARDIAC CATHETERIZATION  2012   50% LAD and luminal irreg in RCA, 1/12  Post cath MI from damage  . CARPAL TUNNEL RELEASE     left  . CARPAL TUNNEL RELEASE  10/11/11   Dr WhitRush BarerCATARACT EXTRACTION W/ INTRAOCULAR LENS IMPLANT  2/13   Dr EpesMontey HoraCOLONOSCOPY  2001, 02/2010   Dr KaplDeatra Ina. ESOPHAGOGASTRODUODENOSCOPY N/A 08/15/2015   CarlGatha Mayer 2 small gastric AVMs, 1 actively bleeding.  Both treated with APC ablation, clipping of the bleeder  . FOOT FRACTURE SURGERY     right heel repair  . FRACTURE  SURGERY    . KNEE ARTHROSCOPY     left, right knee 10/2009  . MASTOIDECTOMY     x3 on right  . NASAL SEPTUM SURGERY     nasal septal repair  . PENILE PROSTHESIS  REMOVAL    . PENILE PROSTHESIS IMPLANT     x 2--got infection after 2nd and had to remove  . PILONIDAL CYST / SINUS EXCISION    . SHOULDER ARTHROSCOPY     right  . TONSILLECTOMY AND ADENOIDECTOMY      There were no vitals filed for this visit.  Subjective Assessment - 02/19/19 0949    Subjective  Patient reports doing well; reports les back pain today; reports no new falls;    Patient is accompained by:  Family member    Pertinent History  Patinet has been using a rollator for a year and 1/2, and a cane for 4 years before that. He has been having PT for the last 4 years. He has had  several falls.    How long can you stand comfortably?  15 mins    How long can you walk comfortably?  150 feet    Patient Stated Goals  Patient  wants to be stronger to be abel to stand and do activiites.    Currently in Pain?  No/denies    Pain Onset  More than a month ago    Multiple Pain Sites  No           TREATMENT: Warm upwith gait in hallway x576fet with rollator with 0 seated rest break, [id number] patient exhibits increased shortness of breath;   Sit<>Stand from regular chair with ball overhead lift x10 reps with CGA for safety and cues to increase knee extension upon standing;   Seated ankle DF red tband x20 reps bilaterally with min VCs for proper positioning and to isolate ankle ROM for better strengthening;  Patient instructed in advanced balance exercise   Standing on airex foam: -alternate toe taps to 4 inch step with 1-0rail assist x15reps bilaterally; -Standing one foot on airex, one foot on 4 inch step,BUE ball pass side/side x5 reps each with min A for safety and mod VCs to improve weight shift for better stance control; -standing feet apart, finger tip hold to unsupported, hip flexion/extension weight shift x10 reps with min A for safety and intermittent knee buckling; -modified tandem stance:unsupported standing x10 sec hold x2 sets each foot in front with cues for weight shift and erect posture for better stance control;    Standing on firm surface: feet together, unsupported forward/backward weight shift with hip flexion/extension to challenge hip strategies; Required mod Vcs to reduce knee flexion/buckle for better hip mobility;    4 square stepping without AD x3 laps clockwise/counterclockwise with min VCs to increase step length and to improve weight shift for better foot clearance; Patient often exhibits increased knee flexion for balance control due to poor ankle strategies;   Standing on firm surface: Multiple directional  stepping forward, side, backwards unsupported x3 sets each LE with min A for safety; Pt exhibits decreased hip/ankle strategies with intermittent knee buckling due to weakness and imbalance. He exhibits slower reaction time requiring min A for safety; Patient instructed to hold onto counter for safety with HEP;   Response to treatment: Patient often becomes short of breath, Vitals monitored, SPO2 95%, HR in high 80's, low 90's; multiple seated rest breaks offered for patient to catch breath due to dyspnea on exertion; Patient requires min VCs  for correct exercise technique for optimum muscle activation and strengthening; Instructed patient in advanced balance tasks, having patient stand unsupported with challenge to hip/ankle strategies; He seemed to have a harder time on firm surface compared to foam unsupported which could be due to decreased ankle strategies from weakness in ankles.                        PT Education - 02/19/19 0950    Education Details  exercise technique/balance/HEP    Person(s) Educated  Patient    Methods  Explanation;Verbal cues    Comprehension  Verbalized understanding;Returned demonstration;Verbal cues required;Need further instruction       PT Short Term Goals - 02/10/19 0942      PT SHORT TERM GOAL #1   Title  Patient will be independent in home exercise program to improve strength/mobility for better functional independence with ADLs.    Time  4    Period  Weeks    Status  Achieved    Target Date  01/29/19      PT SHORT TERM GOAL #2   Title  Patient (> 41 years old) will complete five times sit to stand test in < 15 seconds indicating an increased LE strength and improved balance.    Time  4    Period  Weeks    Status  Partially Met    Target Date  01/29/19        PT Long Term Goals - 02/10/19 0947      PT LONG TERM GOAL #1   Title  Patient will increase 10 meter walk test to >1.18ms as to improve gait speed for better  community ambulation and to reduce fall risk.    Time  8    Period  Weeks    Status  Partially Met    Target Date  02/26/19      PT LONG TERM GOAL #2   Title  Patient will reduce timed up and go to <11 seconds to reduce fall risk and demonstrate improved transfer/gait ability.    Time  8    Period  Weeks    Status  Achieved    Target Date  02/26/19      PT LONG TERM GOAL #3   Title  Patient will increase BLE gross strength to 4+/5 as to improve functional strength for independent gait, increased standing tolerance and increased ADL ability.    Time  8    Period  Weeks    Status  Partially Met    Target Date  02/26/19            Plan - 02/19/19 1126    Clinical Impression Statement  Patient motivated and participated well within session. Advanced balance exercise with instruction in hip strategies and weight shift in standing unsupported. He had increased difficulty standing on firm surface compared to foam due to decreased ankle strategies, exhibiting increased knee buckling. Advanced HEP with multiple directional stepping. He continues to fatigue with prolonged standing requiring intermittent seated rest breaks; Patient would benefit from additional skilled PT Intervention to improve strength, balance and gait safety;    Personal Factors and Comorbidities  Age    Examination-Activity Limitations  Locomotion Level;Stand;Transfers    Examination-Participation Restrictions  Driving;Community Activity    Stability/Clinical Decision Making  Stable/Uncomplicated    Rehab Potential  Good    PT Frequency  2x / week    PT Duration  8 weeks  PT Treatment/Interventions  Therapeutic exercise;Therapeutic activities;Balance training;Neuromuscular re-education;Patient/family education;Manual techniques    PT Next Visit Plan  gait in // bars, dynamic balance    Consulted and Agree with Plan of Care  Patient;Family member/caregiver       Patient will benefit from skilled therapeutic  intervention in order to improve the following deficits and impairments:  Abnormal gait, Decreased activity tolerance, Decreased endurance, Decreased strength, Impaired flexibility, Difficulty walking, Decreased mobility, Cardiopulmonary status limiting activity  Visit Diagnosis: Muscle weakness (generalized)  Difficulty in walking, not elsewhere classified     Problem List Patient Active Problem List   Diagnosis Date Noted  . Dysuria 12/31/2017  . Constipation 12/31/2017  . CAD (coronary artery disease), native coronary artery 12/28/2017  . Bilateral carotid artery stenosis 12/04/2017  . Laceration of right leg excluding thigh 10/23/2017  . Alcohol dependence in remission ([place]) 06/24/2017  . Imbalance 02/05/2017  . Mitral regurgitation 07/17/2016  . Dyslipidemia 07/14/2016  . Encounter for anticoagulation discussion and counseling 05/03/2015  . Chronic anticoagulation 03/01/2015  . 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 TIA (transient ischemic attack) 12/31/2014  . Advanced directives, counseling/discussion 04/19/2014  . Spinal stenosis, lumbar region, with neurogenic claudication 12/23/2012  . Osteoarthritis, multiple sites 10/08/2012  . Preventative health care 02/25/2012  . MI, old with prior NSTEMI x 2 12/28/2011  . Chronic diastolic HF (heart failure) ([place]) 12/28/2011    Class: Acute  . Edema 09/20/2010  . Allergic rhinitis 11/11/2008  . Mood disorder ([place]) 04/14/2008  . Essential hypertension 04/14/2008  . Chronic atrial fibrillation 04/14/2008  . COPD GOLD II B 04/14/2008    Trotter,Margaret PT, DPT 02/19/2019, 11:27 AM  [place] MAIN [facility] SERVICES [address] [place], [place], [address] Phone: [phone]   Fax:  [phone]  Name: SHAHZAIN KIESTER MRN: [id number] Date of Birth: 08-Mar-1931

## 2019-02-19 NOTE — Patient Instructions (Signed)
Access Code: [email or web address]  URL: [email or web address]  Date: 02/19/2019  Prepared by: Blanche East   Exercises  Alternating Step Forward with Support - 5 reps - 2 sets - 1x daily - 7x weekly

## 2019-02-24 ENCOUNTER — Encounter: Payer: Self-pay | Admitting: Physical Therapy

## 2019-02-24 ENCOUNTER — Ambulatory Visit: Payer: PPO | Admitting: Physical Therapy

## 2019-02-24 ENCOUNTER — Other Ambulatory Visit: Payer: Self-pay

## 2019-02-24 DIAGNOSIS — M6281 Muscle weakness (generalized): Secondary | ICD-10-CM

## 2019-02-24 DIAGNOSIS — R262 Difficulty in walking, not elsewhere classified: Secondary | ICD-10-CM

## 2019-02-24 NOTE — Therapy (Signed)
[place] MAIN [facility] [address] [place], [place], [address] Phone: [phone]   Fax:  [phone]  Physical Therapy Treatment  Patient Details  Name: BRANTLEE PENN MRN: [id number] Date of Birth: [birth year] Referring Provider (PT): [name]   Encounter Date: 02/24/2019  PT End of Session - 02/24/19 0940    Visit Number  14    Number of Visits  17    Date for PT Re-Evaluation  02/25/19    PT Start Time  0935    PT Stop Time  1015    PT Time Calculation (min)  40 min    Equipment Utilized During Treatment  Gait belt    Activity Tolerance  Patient tolerated treatment well    Behavior During Therapy  [facility] for tasks assessed/performed       Past Medical History:  Diagnosis Date  . Allergy   . Anemia 2012, 08/2015   chronic. Acute due to large hematoma 2013. 2017: due to gastric AVM bleeding.   . Anxiety   . CAD (coronary artery disease)    a. 06/2010 Cath: LM nl, LAD 50p/m, LCX 42m RCA 50p/m -->catheter dissection but good flow, 70d, RPDA 50-70;  b. 06/2010 Relook cath due to bradycardia and inf ST elev: RCA dissection @ ostium extending into coronary cusp and prox RCA, Ao dissection-->less staining than previously-->Med Rx.  . Chronic combined systolic (congestive) and diastolic (congestive) heart failure ([place]    a. 12/2014 Echo: EF 60-65%; b. 07/2016 Echo: EF 45-50%, mild LVH. Mild AS/MS. Mod-sev MR. Mild to mod TR. PASP 562mg; c. 08/2018 Echo: EF 55-60%, RVSP 68.71m471m. Sev dil LA, mod dil RA. at least mod MR, mild to mod TR. Mild AS.  . CMarland KitchenPD (chronic obstructive pulmonary disease) ([id number]   exertional dyspnea  . Gastric angiodysplasia with hemorrhage 08/2015  . Hx of colonic polyps 2001, 2011   adenomatous 2001. HP 2001, 2011.  . HMarland Kitchenperlipidemia   . Mitral regurgitation    a. 07/2016 Echo: Mod to sev MR; b. 08/2018 Echo: at least moderate MR.  . [place]ocardial infarction ([place]/2012.  12/2011.   "twice; 1 day apart;  during/after cath". NQMI in setting severe anemia 2013.   . NMarland Kitchenuropathy 2014   in feet, likely due to spinal stenosis, DDD, HNP  . Osteoarthritis 2002   spinal stenosis, spondylolisthesis, disc protrusion multilevel in lumbar spine  . Permanent atrial fibrillation 2012   a.  CHA2DS2VASc = 6-->eliquis;  b. 12/2015 Echo: EF 60-65%, no rwma, LVH, mild AS/MS/MR, sev dil LA, mild TR, PASP 65m51m  . Stroke (HCC)[place]16  . Upper GI bleed 08/2015   EGD: 2 small gastric AVMs, 1 actively bleeding.  Both treated with APC ablation, clipping of the bleeder    Past Surgical History:  Procedure Laterality Date  . APPENDECTOMY    . CARDIAC CATHETERIZATION  2012   50% LAD and luminal irreg in RCA, 1/12  Post cath MI from damage  . CARPAL TUNNEL RELEASE     left  . CARPAL TUNNEL RELEASE  10/11/11   Dr WhitRush BarerCATARACT EXTRACTION W/ INTRAOCULAR LENS IMPLANT  2/13   Dr EpesMontey HoraCOLONOSCOPY  2001, 02/2010   Dr KaplDeatra Ina. ESOPHAGOGASTRODUODENOSCOPY N/A 08/15/2015   CarlGatha Mayer 2 small gastric AVMs, 1 actively bleeding.  Both treated with APC ablation, clipping of the bleeder  . FOOT FRACTURE SURGERY     right heel repair  . FRACTURE  SURGERY    . KNEE ARTHROSCOPY     left, right knee 10/2009  . MASTOIDECTOMY     x3 on right  . NASAL SEPTUM SURGERY     nasal septal repair  . PENILE PROSTHESIS  REMOVAL    . PENILE PROSTHESIS IMPLANT     x 2--got infection after 2nd and had to remove  . PILONIDAL CYST / SINUS EXCISION    . SHOULDER ARTHROSCOPY     right  . TONSILLECTOMY AND ADENOIDECTOMY      There were no vitals filed for this visit.  Subjective Assessment - 02/24/19 0940    Subjective  Pt reports doing well; He reports staying home most of the weekend; He reports some pain in lower low back (sciatic region) today; He reports some increase in pain during HEP requiring increased rest break;    Patient is accompained by:  Family member    Pertinent History  Patinet has been  using a rollator for a year and 1/2, and a cane for 4 years before that. He has been having PT for the last 4 years. He has had several falls.    How long can you stand comfortably?  15 mins    How long can you walk comfortably?  150 feet    Patient Stated Goals  Patient  wants to be stronger to be abel to stand and do activiites.    Currently in Pain?  Yes    Pain Score  3     Pain Location  Back    Pain Orientation  Lower    Pain Descriptors / Indicators  Aching;Sore    Pain Type  Chronic pain    Pain Onset  More than a month ago    Pain Frequency  Intermittent    Aggravating Factors   bending/stooping    Pain Relieving Factors  rest    Effect of Pain on Daily Activities  decreased activity tolerance;    Multiple Pain Sites  No           TREATMENT: Warm upwith gait in hallway [id number] with rollator with 0 seated rest break during ambulation; He did require seated rest break after walking, [id number]%, patient exhibits increased shortness of breath;    Seated ankleDFred tbandx20 reps bilaterallywith minVCs for proper positioning and to isolate ankle ROM for better strengthening; Seated ankle PF with red tband x20 reps with min VCs for proper positioning including to sit forward on seat and keep heel on floor for better PF activation; Patient exhibits decreased motor control and increased weakness on RLE:   Patient instructed in advanced balance exercise   Standing on firm surface:  -alternate toe taps to 4 inch step with 1-0rail assist x15reps x2 sets bilaterally; Patient requires min A for safety and cues for better weight shift for less unsteadiness; He required intermittent mod A to avoid loss of balance.  He does have difficulty reducing rail assist with impaired balance control. Unsure if patient was more unsteady this session because of standing on firm surface or because of increased back discomfort.   -Standing one foot on firm surface, one foot  on 4 inch step,BUE ball pass side/side x5 reps each with min A for safety and mod VCs to improve weight shift for better stance control;Pt able to exhibit improved stance control but continues to have increased posterior lean;   -standing firm surface feet apart,unsupported, hip flexion/extension weight shift x10 reps with min A for safety and intermittent knee  buckling; He exhibits multiple stepping strategies to avoid loss of balance;   Multiple directional stepping forward, side, backwards, diagonal (half-step) unsupported x3 sets each LE with min A for safety; Pt exhibits decreased hip/ankle strategies with intermittent knee buckling due to weakness and imbalance. He exhibits slower reaction time requiring min A for safety; cues to fully shift weight to LE going into SLS and maintain knee extension prior to stepping to foster a more stable base of support;  Response to treatment: Patient often becomes short of breath, Vitals monitored, SPO2 95%, HR in high 80's, low 90's; multiple seated rest breaks offered for patient to catch breath due to dyspnea on exertion; Patient requires min VCs for correct exercise technique for optimum muscle activation and strengthening; Instructed patient in advanced balance tasks, having patient stand unsupported with challenge to hip/ankle strategies; He seemed to have a harder time on firm surface compared to foam unsupported which could be due to decreased ankle strategies from weakness in ankles. Patient required frequent cues for better weight shift and to activate hip/ankle strategies for less knee buckling and less reaction stepping;  Implemented less unstable surfaces to both reflect patient's everyday environment and target the challenges experienced while standing on firm surfaces.                     PT Education - 02/24/19 0940    Education Details  exercise technique/balance/HEP    Person(s) Educated  Patient    Methods   Explanation;Verbal cues    Comprehension  Verbalized understanding;Returned demonstration;Verbal cues required;Need further instruction       PT Short Term Goals - 02/10/19 0942      PT SHORT TERM GOAL #1   Title  Patient will be independent in home exercise program to improve strength/mobility for better functional independence with ADLs.    Time  4    Period  Weeks    Status  Achieved    Target Date  01/29/19      PT SHORT TERM GOAL #2   Title  Patient (> 60 years old) will complete five times sit to stand test in < 15 seconds indicating an increased LE strength and improved balance.    Time  4    Period  Weeks    Status  Partially Met    Target Date  01/29/19        PT Long Term Goals - 02/10/19 0947      PT LONG TERM GOAL #1   Title  Patient will increase 10 meter walk test to >1.0m/s as to improve gait speed for better community ambulation and to reduce fall risk.    Time  8    Period  Weeks    Status  Partially Met    Target Date  02/26/19      PT LONG TERM GOAL #2   Title  Patient will reduce timed up and go to <11 seconds to reduce fall risk and demonstrate improved transfer/gait ability.    Time  8    Period  Weeks    Status  Achieved    Target Date  02/26/19      PT LONG TERM GOAL #3   Title  Patient will increase BLE gross strength to 4+/5 as to improve functional strength for independent gait, increased standing tolerance and increased ADL ability.    Time  8    Period  Weeks    Status  Partially Met      Target Date  02/26/19            Plan - 02/24/19 1216    Clinical Impression Statement  Patient motivated and participated well within session. Progressed balance exercises with continued instruction in weight shifting and hip and ankle strategies while standing unsupported. Implemented less unstable surfaces to both reflect patient's everyday environment and target the challenges experienced while standing on firm surfaces. Patient continues to have  difficulty with limiting knee buckling as a first reaction to loss of balance, addressed by continuing anterior/posterior lean and multi-directional stepping with cues to fully shift weight onto LE prior to stepping and maintaining knee extension of LE in SLS. Continues to become fatigued with prolonged standing activities, eased with frequent rest breaks. Patient would benefit from additional skilled PT intervention to improve strength, balance and gait safety.    Personal Factors and Comorbidities  Age    Examination-Activity Limitations  Locomotion Level;Stand;Transfers    Examination-Participation Restrictions  Driving;Community Activity    Stability/Clinical Decision Making  Stable/Uncomplicated    Rehab Potential  Good    PT Frequency  2x / week    PT Duration  8 weeks    PT Treatment/Interventions  Therapeutic exercise;Therapeutic activities;Balance training;Neuromuscular re-education;Patient/family education;Manual techniques    PT Next Visit Plan  gait in // bars, dynamic balance    Consulted and Agree with Plan of Care  Patient;Family member/caregiver       Patient will benefit from skilled therapeutic intervention in order to improve the following deficits and impairments:  Abnormal gait, Decreased activity tolerance, Decreased endurance, Decreased strength, Impaired flexibility, Difficulty walking, Decreased mobility, Cardiopulmonary status limiting activity  Visit Diagnosis: Muscle weakness (generalized)  Difficulty in walking, not elsewhere classified     Problem List Patient Active Problem List   Diagnosis Date Noted  . Dysuria 12/31/2017  . Constipation 12/31/2017  . CAD (coronary artery disease), native coronary artery 12/28/2017  . Bilateral carotid artery stenosis 12/04/2017  . Laceration of right leg excluding thigh 10/23/2017  . Alcohol dependence in remission ([place]) 06/24/2017  . Imbalance 02/05/2017  . Mitral regurgitation 07/17/2016  . Dyslipidemia 07/14/2016   . Encounter for anticoagulation discussion and counseling 05/03/2015  . Chronic anticoagulation 03/01/2015  . 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 TIA (transient ischemic attack) 12/31/2014  . Advanced directives, counseling/discussion 04/19/2014  . Spinal stenosis, lumbar region, with neurogenic claudication 12/23/2012  . Osteoarthritis, multiple sites 10/08/2012  . Preventative health care 02/25/2012  . MI, old with prior NSTEMI x 2 12/28/2011  . Chronic diastolic HF (heart failure) ([place]) 12/28/2011    Class: Acute  . Edema 09/20/2010  . Allergic rhinitis 11/11/2008  . Mood disorder ([place]) 04/14/2008  . Essential hypertension 04/14/2008  . Chronic atrial fibrillation 04/14/2008  . COPD GOLD II B 04/14/2008    , PT, DPT 02/24/2019, 12:39 PM  [place] MAIN [facility] SERVICES [address] [place], [place], [address] Phone: [phone]   Fax:  [phone]  Name: NICKO DAHER MRN: [id number] Date of Birth: 08/04/1930

## 2019-02-26 ENCOUNTER — Encounter: Payer: Self-pay | Admitting: Physical Therapy

## 2019-02-26 ENCOUNTER — Other Ambulatory Visit: Payer: Self-pay

## 2019-02-26 ENCOUNTER — Ambulatory Visit: Payer: PPO | Admitting: Physical Therapy

## 2019-02-26 DIAGNOSIS — M6281 Muscle weakness (generalized): Secondary | ICD-10-CM | POA: Diagnosis not present

## 2019-02-26 DIAGNOSIS — R262 Difficulty in walking, not elsewhere classified: Secondary | ICD-10-CM

## 2019-02-26 NOTE — Therapy (Signed)
[place] MAIN [facility] SERVICES [address] [place], [place], [address] Phone: [phone]   Fax:  [phone]  Physical Therapy Treatment  Patient Details  Name: Eduardo [name] MRN: [id number] Date of Birth: 1931/05/20 Referring Provider (PT): [name]   Encounter Date: 02/26/2019  PT End of Session - 02/26/19 0932    Visit Number  15    Number of Visits  25    Date for PT Re-Evaluation  03/26/19    PT Start Time  0928    PT Stop Time  1010    PT Time Calculation (min)  42 min    Equipment Utilized During Treatment  Gait belt    Activity Tolerance  Patient tolerated treatment well    Behavior During Therapy  [facility] for tasks assessed/performed       Past Medical History:  Diagnosis Date  . Allergy   . Anemia 2012, 08/2015   chronic. Acute due to large hematoma 2013. 2017: due to gastric AVM bleeding.   . Anxiety   . CAD (coronary artery disease)    a. 06/2010 Cath: LM nl, LAD 50p/m, LCX 27m RCA 50p/m -->catheter dissection but good flow, 70d, RPDA 50-70;  b. 06/2010 Relook cath due to bradycardia and inf ST elev: RCA dissection @ ostium extending into coronary cusp and prox RCA, Ao dissection-->less staining than previously-->Med Rx.  . Chronic combined systolic (congestive) and diastolic (congestive) heart failure ([place]    a. 12/2014 Echo: EF 60-65%; b. 07/2016 Echo: EF 45-50%, mild LVH. Mild AS/MS. Mod-sev MR. Mild to mod TR. PASP 5864mg; c. 08/2018 Echo: EF 55-60%, RVSP 68.64m62m. Sev dil LA, mod dil RA. at least mod MR, mild to mod TR. Mild AS.  . CMarland KitchenPD (chronic obstructive pulmonary disease) ([id number]   exertional dyspnea  . Gastric angiodysplasia with hemorrhage 08/2015  . Hx of colonic polyps 2001, 2011   adenomatous 2001. HP 2001, 2011.  . HMarland Kitchenperlipidemia   . Mitral regurgitation    a. 07/2016 Echo: Mod to sev MR; b. 08/2018 Echo: at least moderate MR.  . Myocardial infarction ([place]/2012.  12/2011.   "twice; 1 day apart;  during/after cath". NQMI in setting severe anemia 2013.   . NMarland Kitchenuropathy 2014   in feet, likely due to spinal stenosis, DDD, HNP  . Osteoarthritis 2002   spinal stenosis, spondylolisthesis, disc protrusion multilevel in lumbar spine  . Permanent atrial fibrillation 2012   a.  CHA2DS2VASc = 6-->eliquis;  b. 12/2015 Echo: EF 60-65%, no rwma, LVH, mild AS/MS/MR, sev dil LA, mild TR, PASP 28m38m  . Stroke (HCC)[id number]  . Upper GI bleed 08/2015   EGD: 2 small gastric AVMs, 1 actively bleeding.  Both treated with APC ablation, clipping of the bleeder    Past Surgical History:  Procedure Laterality Date  . APPENDECTOMY    . CARDIAC CATHETERIZATION  2012   50% LAD and luminal irreg in RCA, 1/12  Post cath MI from damage  . CARPAL TUNNEL RELEASE     left  . CARPAL TUNNEL RELEASE  10/11/11   Dr WhitRush BarerCATARACT EXTRACTION W/ INTRAOCULAR LENS IMPLANT  2/13   Dr EpesMontey HoraCOLONOSCOPY  2001, 02/2010   Dr KaplDeatra Ina. ESOPHAGOGASTRODUODENOSCOPY N/A 08/15/2015   CarlGatha Mayer 2 small gastric AVMs, 1 actively bleeding.  Both treated with APC ablation, clipping of the bleeder  . FOOT FRACTURE SURGERY     right heel repair  . FRACTURE  SURGERY    . KNEE ARTHROSCOPY     left, right knee 10/2009  . MASTOIDECTOMY     x3 on right  . NASAL SEPTUM SURGERY     nasal septal repair  . PENILE PROSTHESIS  REMOVAL    . PENILE PROSTHESIS IMPLANT     x 2--got infection after 2nd and had to remove  . PILONIDAL CYST / SINUS EXCISION    . SHOULDER ARTHROSCOPY     right  . TONSILLECTOMY AND ADENOIDECTOMY      There were no vitals filed for this visit.  Subjective Assessment - 02/26/19 0931    Subjective  Patient reports doing well; He reports no back pain today. He reports not doing anything yesterday and states that he is feeling a lot better today.    Patient is accompained by:  Family member    Pertinent History  Patinet has been using a rollator for a year and 1/2, and a cane for 4 years  before that. He has been having PT for the last 4 years. He has had several falls.    How long can you stand comfortably?  15 mins    How long can you walk comfortably?  150 feet    Patient Stated Goals  Patient  wants to be stronger to be abel to stand and do activiites.    Currently in Pain?  No/denies    Pain Onset  More than a month ago    Multiple Pain Sites  No          TREATMENT: Warm upwith gait in hallway [id number] with rollator with0seated rest break during ambulation; He did require seated rest break after walking, HR 83SPO293%, patient exhibits increased shortness of breath;    Seated ankleDFred tbandx20 reps bilaterallywith minVCs for proper positioning and to isolate ankle ROM for better strengthening; Seated ankle PF with red tband x20 reps bilaterally with min VCs for proper positioning including to sit forward on seat and keep heel on floor for better PF activation; Patient exhibits decreased motor control and increased weakness on RLE:   Patient instructed in advanced balance exercise   Standing on firm surface:  -Feet together:  Moving saebo balls up/down 4 rungs (4 balls each rung) x1 set each UE with min-mod A for safety and cues for increased weight shift for better stance control; Pt exhibits increased knee buckling with forward loss of balance or posterior loss of balance with decreased hip/ankle strategies requiring assist for stance control; He was able to exhibit slight increase in DF ankle strategies this session compared to previous sessions;  Moving saebo balls in various directions on rungs x 3   Min with therapist calling out colored rung to challenge dual task with standing unsupported with cognitive challenge;    Starting in parallel bars, progressing to stepping outside bars for added difficulty: -forward walking picking up cones #3 x2 sets in parallel bars -forward walking picking up cones #3 outside bars; -forward walking  with cones on each side reaching out to side #6 x2 sets challenging lateral reach and stepping to side Patient requires CGA to min A throughout activity with cues for weight shift and step/foot placement for better dynamic balance safety; Patient often utilizes momentum to help maintain balance having difficulty slowing down steps due to imbalance; Required cues to slow down steps and picking up to challenge static stance control following dynamic movement; patient exhibits multiple stepping strategies due to poor ankle/hip strategies;  -Side stepping picking  up cones #3 x1 sets each direction (side stepping approximately 15 feet) with intermittent picking up cones; Patient requires min A for safety and cues for foot placement and sequencing. He exhibits decreased ankle/hip strategies, relying on knees for balance control; With increased repetition patient often bending to pick up cones with wider base of support to improve stance control with less unsteadiness noted;    Response to treatment: Patient often becomes short of breath, Vitals monitored, SPO2 93-95%, HR in high 80's, low 90's; multiple seated rest breaks offered for patient to catch breath due to dyspnea on exertion; Patient requires min VCs for correct exercise technique for optimum muscle activation and strengthening; Instructed patient in advanced balance tasks,having patient stand unsupported with challenge to hip/ankle strategies;Patient required frequent cues for better weight shift and to activate hip/ankle strategies for less knee buckling and less reaction stepping;  Implemented less unstable surfaces to both reflect patient's everyday environment and target the challenges experienced while standing on firm surfaces.                             PT Education - 02/26/19 0931    Education Details  exercise technique, balance/HEP    Person(s) Educated  Patient    Methods  Explanation;Verbal cues     Comprehension  Verbalized understanding;Returned demonstration;Verbal cues required;Need further instruction       PT Short Term Goals - 02/10/19 3557      PT SHORT TERM GOAL #1   Title  Patient will be independent in home exercise program to improve strength/mobility for better functional independence with ADLs.    Time  4    Period  Weeks    Status  Achieved    Target Date  01/29/19      PT SHORT TERM GOAL #2   Title  Patient (> 84 years old) will complete five times sit to stand test in < 15 seconds indicating an increased LE strength and improved balance.    Time  4    Period  Weeks    Status  Partially Met    Target Date  01/29/19        PT Long Term Goals - 02/26/19 0934      PT LONG TERM GOAL #1   Title  Patient will increase 10 meter walk test to >1.89ms as to improve gait speed for better community ambulation and to reduce fall risk.    Time  4    Period  Weeks    Status  Partially Met    Target Date  03/26/19      PT LONG TERM GOAL #2   Title  Patient will reduce timed up and go to <11 seconds to reduce fall risk and demonstrate improved transfer/gait ability.    Time  4    Period  Weeks    Status  Achieved    Target Date  03/26/19      PT LONG TERM GOAL #3   Title  Patient will increase BLE gross strength to 4+/5 as to improve functional strength for independent gait, increased standing tolerance and increased ADL ability.    Time  4    Period  Weeks    Status  Partially Met    Target Date  03/26/19            Plan - 02/26/19 0933    Clinical Impression Statement  patient motivated and participated well within session.  He is progressing fair. He does continue to have shortness of breath and fatigue with prolonged standing/walking. Although this is improving. He requires min VCs for proper positioning and exercise technique for better muscle activation and posture. He exhibits increased unsteadiness with unsupported standing especially on firm surface  with decreased hip/ankle strategies. Balance exercise has focused on improving hip/ankle strategies for better balance control and to reduce fall risk; He has been adherent with HEP; Goals were recently assessed on 02/10/19, refer to progress note that date; Patient would benefit from additional skilled PT Intervention to improve strength, balance and gait safety;    Personal Factors and Comorbidities  Age    Examination-Activity Limitations  Locomotion Level;Stand;Transfers    Examination-Participation Restrictions  Driving;Community Activity    Stability/Clinical Decision Making  Stable/Uncomplicated    Rehab Potential  Good    PT Frequency  2x / week    PT Duration  8 weeks    PT Treatment/Interventions  Therapeutic exercise;Therapeutic activities;Balance training;Neuromuscular re-education;Patient/family education;Manual techniques    PT Next Visit Plan  gait in // bars, dynamic balance    Consulted and Agree with Plan of Care  Patient;Family member/caregiver       Patient will benefit from skilled therapeutic intervention in order to improve the following deficits and impairments:  Abnormal gait, Decreased activity tolerance, Decreased endurance, Decreased strength, Impaired flexibility, Difficulty walking, Decreased mobility, Cardiopulmonary status limiting activity  Visit Diagnosis: Muscle weakness (generalized)  Difficulty in walking, not elsewhere classified     Problem List Patient Active Problem List   Diagnosis Date Noted  . Dysuria 12/31/2017  . Constipation 12/31/2017  . CAD (coronary artery disease), native coronary artery 12/28/2017  . Bilateral carotid artery stenosis 12/04/2017  . Laceration of right leg excluding thigh 10/23/2017  . Alcohol dependence in remission ([place]) 06/24/2017  . Imbalance 02/05/2017  . Mitral regurgitation 07/17/2016  . Dyslipidemia 07/14/2016  . Encounter for anticoagulation discussion and counseling 05/03/2015  . Chronic anticoagulation  03/01/2015  . 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 TIA (transient ischemic attack) 12/31/2014  . Advanced directives, counseling/discussion 04/19/2014  . Spinal stenosis, lumbar region, with neurogenic claudication 12/23/2012  . Osteoarthritis, multiple sites 10/08/2012  . Preventative health care 02/25/2012  . MI, old with prior NSTEMI x 2 12/28/2011  . Chronic diastolic HF (heart failure) ([place]) 12/28/2011    Class: Acute  . Edema 09/20/2010  . Allergic rhinitis 11/11/2008  . Mood disorder ([place]) 04/14/2008  . Essential hypertension 04/14/2008  . Chronic atrial fibrillation 04/14/2008  . COPD GOLD II B 04/14/2008    , PT, DPT 02/26/2019, 10:14 AM  [place] MAIN [facility] SERVICES [address] [place], [place], [address] Phone: [phone]   Fax:  [phone]  Name: DARRIEN BELTER MRN: [id number] Date of Birth: 1931/02/27

## 2019-03-02 ENCOUNTER — Other Ambulatory Visit: Payer: Self-pay | Admitting: Pulmonary Disease

## 2019-03-02 ENCOUNTER — Encounter: Payer: Self-pay | Admitting: Physical Therapy

## 2019-03-02 ENCOUNTER — Other Ambulatory Visit: Payer: Self-pay | Admitting: Cardiovascular Disease

## 2019-03-02 ENCOUNTER — Ambulatory Visit: Payer: PPO | Admitting: Physical Therapy

## 2019-03-02 ENCOUNTER — Other Ambulatory Visit: Payer: Self-pay

## 2019-03-02 VITALS — HR 104

## 2019-03-02 DIAGNOSIS — R262 Difficulty in walking, not elsewhere classified: Secondary | ICD-10-CM

## 2019-03-02 DIAGNOSIS — M6281 Muscle weakness (generalized): Secondary | ICD-10-CM

## 2019-03-02 NOTE — Telephone Encounter (Signed)
Please schedule overdue F/U with Dr. Gollan. Thank you! 

## 2019-03-02 NOTE — Therapy (Signed)
[place] MAIN [facility] SERVICES [address] [place], [place], [address] Phone: [phone]   Fax:  [phone]  Physical Therapy Treatment  Patient Details  Name: Eduardo [name] MRN: [id number] Date of Birth: May 05, 1931 Referring Provider (PT): [name]   Encounter Date: 03/02/2019  PT End of Session - 03/02/19 1114    Visit Number  16    Number of Visits  25    Date for PT Re-Evaluation  03/26/19    PT Start Time  1105    PT Stop Time  1148    PT Time Calculation (min)  43 min    Equipment Utilized During Treatment  Gait belt    Activity Tolerance  Patient tolerated treatment well    Behavior During Therapy  [facility] for tasks assessed/performed       Past Medical History:  Diagnosis Date  . Allergy   . Anemia 2012, 08/2015   chronic. Acute due to large hematoma 2013. 2017: due to gastric AVM bleeding.   . Anxiety   . CAD (coronary artery disease)    a. 06/2010 Cath: LM nl, LAD 50p/m, LCX 27m RCA 50p/m -->catheter dissection but good flow, 70d, RPDA 50-70;  b. 06/2010 Relook cath due to bradycardia and inf ST elev: RCA dissection @ ostium extending into coronary cusp and prox RCA, Ao dissection-->less staining than previously-->Med Rx.  . Chronic combined systolic (congestive) and diastolic (congestive) heart failure ([place]    a. 12/2014 Echo: EF 60-65%; b. 07/2016 Echo: EF 45-50%, mild LVH. Mild AS/MS. Mod-sev MR. Mild to mod TR. PASP 563mg; c. 08/2018 Echo: EF 55-60%, RVSP 68.73m64m. Sev dil LA, mod dil RA. at least mod MR, mild to mod TR. Mild AS.  . CMarland KitchenPD (chronic obstructive pulmonary disease) ([id number]   exertional dyspnea  . Gastric angiodysplasia with hemorrhage 08/2015  . Hx of colonic polyps 2001, 2011   adenomatous 2001. HP 2001, 2011.  . HMarland Kitchenperlipidemia   . Mitral regurgitation    a. 07/2016 Echo: Mod to sev MR; b. 08/2018 Echo: at least moderate MR.  . Myocardial infarction ([facility]/2012.  12/2011.   "twice; 1 day apart;  during/after cath". NQMI in setting severe anemia 2013.   . NMarland Kitchenuropathy 2014   in feet, likely due to spinal stenosis, DDD, HNP  . Osteoarthritis 2002   spinal stenosis, spondylolisthesis, disc protrusion multilevel in lumbar spine  . Permanent atrial fibrillation 2012   a.  CHA2DS2VASc = 6-->eliquis;  b. 12/2015 Echo: EF 60-65%, no rwma, LVH, mild AS/MS/MR, sev dil LA, mild TR, PASP 31m43m  . Stroke (HCC)[place]16  . Upper GI bleed 08/2015   EGD: 2 small gastric AVMs, 1 actively bleeding.  Both treated with APC ablation, clipping of the bleeder    Past Surgical History:  Procedure Laterality Date  . APPENDECTOMY    . CARDIAC CATHETERIZATION  2012   50% LAD and luminal irreg in RCA, 1/12  Post cath MI from damage  . CARPAL TUNNEL RELEASE     left  . CARPAL TUNNEL RELEASE  10/11/11   Dr WhitRush BarerCATARACT EXTRACTION W/ INTRAOCULAR LENS IMPLANT  2/13   Dr EpesMontey HoraCOLONOSCOPY  2001, 02/2010   Dr KaplDeatra Ina. ESOPHAGOGASTRODUODENOSCOPY N/A 08/15/2015   CarlGatha Mayer 2 small gastric AVMs, 1 actively bleeding.  Both treated with APC ablation, clipping of the bleeder  . FOOT FRACTURE SURGERY     right heel repair  . FRACTURE  SURGERY    . KNEE ARTHROSCOPY     left, right knee 10/2009  . MASTOIDECTOMY     x3 on right  . NASAL SEPTUM SURGERY     nasal septal repair  . PENILE PROSTHESIS  REMOVAL    . PENILE PROSTHESIS IMPLANT     x 2--got infection after 2nd and had to remove  . PILONIDAL CYST / SINUS EXCISION    . SHOULDER ARTHROSCOPY     right  . TONSILLECTOMY AND ADENOIDECTOMY      Vitals:   03/02/19 1111  Pulse: (!) 104  SpO2: 94%    Subjective Assessment - 03/02/19 1111    Subjective  Patient reports doing well today; Back is doing better today and reports lifting some light weights at home over the weekend. Reports a near-fall over the weekend while walking with a quick turn, recovered with stepping strategy.    Patient is accompained by:  Family member     Pertinent History  Patinet has been using a rollator for a year and 1/2, and a cane for 4 years before that. He has been having PT for the last 4 years. He has had several falls.    How long can you stand comfortably?  15 mins    How long can you walk comfortably?  150 feet    Patient Stated Goals  Patient  wants to be stronger to be abel to stand and do activiites.    Currently in Pain?  No/denies    Pain Score  0-No pain    Pain Onset  More than a month ago        TREATMENT: Warm upwith gait in hallway [id number] with rollator with0seated rest breakduring ambulation; He did require seated rest break after walking, HR 104SPO294%, patient exhibits increased shortness of breath;    Seated ankleDFred tbandx20 reps bilaterallywith minVCs for proper positioning and to isolate ankle ROM for better strengthening; Seated ankle PF with red tband x20 reps bilaterally with min VCs for proper positioning including to sit forward on seat and keep heel on floor for better PF activation; Patient exhibits decreased motor control and increased weakness on RLE:  Patient instructed in advanced balance exercise   Standing onfirm surface: -Feet together:             Moving saebo balls up/down 4 rungs (4 balls each rung) x2 set each UE with min-mod A for safety and cues for increased weight shift for better stance control; Pt exhibits increased knee buckling with forward loss of balance or posterior loss of balance with decreased hip/ankle strategies requiring assist for stance control, occurs more frequently as fatigue sets in;             Moving saebo balls in various directions on rungs x 4   Min alternating UE with therapist calling out colored rung to challenge dual task with standing unsupported with cognitive challenge;    In 10MWT area: -forward walking with cones on each side reaching out to side #6 x3 sets challenging lateral reach and start/stop of gait; Patient  requires CGA to min A throughout activity with cues for weight shift and step/foot placement for better dynamic balance safety; Patient often utilizes momentum to help maintain balance having difficulty slowing down steps due to imbalance; Required cues to slow down steps and picking up to challenge static stance control following dynamic movement; patient exhibits multiple stepping strategies due to poor ankle/hip strategies; Cues to extend BLE while returning to  standing to prevent prolonged knee flexion akin to a partial squat stance, which causes increased fatigue, emphasis on returning to an upright posture.   Response to treatment: Patient often becomes short of breath, Vitals monitored, SPO2 94%, HR in high 80's, low 100's; multiple seated rest breaks offered for patient to catch breath due to dyspnea on exertion; Patient requires min VCs for correct exercise technique for optimum muscle activation and strengthening; Instructed patient in advanced balance tasks,having patient stand unsupported with challenge to hip/ankle strategies;Patient required frequent cues for better weight shift and to activate hip/ankle strategies for less knee buckling and less reaction stepping; Implemented more gait initiation and cessation activities this session to address instances where patient experiences loss of balance/near-falls at home.       PT Short Term Goals - 02/10/19 5329      PT SHORT TERM GOAL #1   Title  Patient will be independent in home exercise program to improve strength/mobility for better functional independence with ADLs.    Time  4    Period  Weeks    Status  Achieved    Target Date  01/29/19      PT SHORT TERM GOAL #2   Title  Patient (> 47 years old) will complete five times sit to stand test in < 15 seconds indicating an increased LE strength and improved balance.    Time  4    Period  Weeks    Status  Partially Met    Target Date  01/29/19        PT Long Term  Goals - 02/26/19 0934      PT LONG TERM GOAL #1   Title  Patient will increase 10 meter walk test to >1.6ms as to improve gait speed for better community ambulation and to reduce fall risk.    Time  4    Period  Weeks    Status  Partially Met    Target Date  03/26/19      PT LONG TERM GOAL #2   Title  Patient will reduce timed up and go to <11 seconds to reduce fall risk and demonstrate improved transfer/gait ability.    Time  4    Period  Weeks    Status  Achieved    Target Date  03/26/19      PT LONG TERM GOAL #3   Title  Patient will increase BLE gross strength to 4+/5 as to improve functional strength for independent gait, increased standing tolerance and increased ADL ability.    Time  4    Period  Weeks    Status  Partially Met    Target Date  03/26/19            Plan - 03/02/19 1156    Clinical Impression Statement  Patient motivated and participated well within session. Continues to have shortness of breath and fatigue following prolonged standing and walking, requiring seated rest breaks often. Requires min VCs for proper positioning and exercise technique for better muscle activation and posture to improve base of support. Patient is challenged by quick stops while walking, where he is unable to control momentum of gait without correctional stepping, improves with VC. Patient will continue to benefit from skilled PT to improve weakness, balance and gait safety.    Personal Factors and Comorbidities  Age    Examination-Activity Limitations  Locomotion Level;Stand;Transfers    Examination-Participation Restrictions  Driving;Community Activity    Stability/Clinical Decision Making  Stable/Uncomplicated  Rehab Potential  Good    PT Frequency  2x / week    PT Duration  8 weeks    PT Treatment/Interventions  Therapeutic exercise;Therapeutic activities;Balance training;Neuromuscular re-education;Patient/family education;Manual techniques    PT Next Visit Plan  gait in //  bars, dynamic balance    Consulted and Agree with Plan of Care  Patient;Family member/caregiver       Patient will benefit from skilled therapeutic intervention in order to improve the following deficits and impairments:  Abnormal gait, Decreased activity tolerance, Decreased endurance, Decreased strength, Impaired flexibility, Difficulty walking, Decreased mobility, Cardiopulmonary status limiting activity  Visit Diagnosis: Muscle weakness (generalized)  Difficulty in walking, not elsewhere classified     Problem List Patient Active Problem List   Diagnosis Date Noted  . Dysuria 12/31/2017  . Constipation 12/31/2017  . CAD (coronary artery disease), native coronary artery 12/28/2017  . Bilateral carotid artery stenosis 12/04/2017  . Laceration of right leg excluding thigh 10/23/2017  . Alcohol dependence in remission ([place]) 06/24/2017  . Imbalance 02/05/2017  . Mitral regurgitation 07/17/2016  . Dyslipidemia 07/14/2016  . Encounter for anticoagulation discussion and counseling 05/03/2015  . Chronic anticoagulation 03/01/2015  . 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 TIA (transient ischemic attack) 12/31/2014  . Advanced directives, counseling/discussion 04/19/2014  . Spinal stenosis, lumbar region, with neurogenic claudication 12/23/2012  . Osteoarthritis, multiple sites 10/08/2012  . Preventative health care 02/25/2012  . MI, old with prior NSTEMI x 2 12/28/2011  . Chronic diastolic HF (heart failure) ([place]) 12/28/2011    Class: Acute  . Edema 09/20/2010  . Allergic rhinitis 11/11/2008  . Mood disorder ([place]) 04/14/2008  . Essential hypertension 04/14/2008  . Chronic atrial fibrillation 04/14/2008  . COPD GOLD II B 04/14/2008   Kamill Fulbright A. Rosana Hoes, SPT This entire session was performed under direct supervision and direction of a licensed therapist/therapist assistant . I have personally read, edited and approve of the note as written.  Trotter,Margaret  PT,DPT 03/02/2019, 12:56 PM  [place] MAIN [facility] SERVICES [address] [place], [place], [address] Phone: [phone]   Fax:  [phone]  Name: KY RUMPLE MRN: [id number] Date of Birth: [birth year]

## 2019-03-02 NOTE — Telephone Encounter (Signed)
Patient is scheduled on 10/7 with [name]

## 2019-03-03 ENCOUNTER — Ambulatory Visit: Payer: PPO | Admitting: Physical Therapy

## 2019-03-03 ENCOUNTER — Encounter: Payer: Self-pay | Admitting: Physical Therapy

## 2019-03-03 ENCOUNTER — Other Ambulatory Visit: Payer: Self-pay

## 2019-03-03 VITALS — HR 77

## 2019-03-03 DIAGNOSIS — M6281 Muscle weakness (generalized): Secondary | ICD-10-CM

## 2019-03-03 DIAGNOSIS — R262 Difficulty in walking, not elsewhere classified: Secondary | ICD-10-CM

## 2019-03-03 NOTE — Therapy (Signed)
[place] MAIN [facility] SERVICES [address] [place], [place], [address] Phone: [phone]   Fax:  [phone]  Physical Therapy Treatment  Patient Details  Name: Eduardo [name] MRN: [id number] Date of Birth: Mar 26, 1931 Referring Provider (PT): [name]   Encounter Date: 03/03/2019  PT End of Session - 03/03/19 0944    Visit Number  17    Number of Visits  25    Date for PT Re-Evaluation  03/26/19    PT Start Time  0933    PT Stop Time  1015    PT Time Calculation (min)  42 min    Equipment Utilized During Treatment  Gait belt    Activity Tolerance  Patient tolerated treatment well    Behavior During Therapy  [facility] for tasks assessed/performed       Past Medical History:  Diagnosis Date  . Allergy   . Anemia 2012, 08/2015   chronic. Acute due to large hematoma 2013. 2017: due to gastric AVM bleeding.   . Anxiety   . CAD (coronary artery disease)    a. 06/2010 Cath: LM nl, LAD 50p/m, LCX 37m RCA 50p/m -->catheter dissection but good flow, 70d, RPDA 50-70;  b. 06/2010 Relook cath due to bradycardia and inf ST elev: RCA dissection @ ostium extending into coronary cusp and prox RCA, Ao dissection-->less staining than previously-->Med Rx.  . Chronic combined systolic (congestive) and diastolic (congestive) heart failure ([place]    a. 12/2014 Echo: EF 60-65%; b. 07/2016 Echo: EF 45-50%, mild LVH. Mild AS/MS. Mod-sev MR. Mild to mod TR. PASP 54mg; c. 08/2018 Echo: EF 55-60%, RVSP 68.68m33m. Sev dil LA, mod dil RA. at least mod MR, mild to mod TR. Mild AS.  . CMarland KitchenPD (chronic obstructive pulmonary disease) (HCCP[place]013   exertional dyspnea  . Gastric angiodysplasia with hemorrhage 08/2015  . Hx of colonic polyps 2001, 2011   adenomatous 2001. HP 2001, 2011.  . HMarland Kitchenperlipidemia   . Mitral regurgitation    a. 07/2016 Echo: Mod to sev MR; b. 08/2018 Echo: at least moderate MR.  . Myocardial infarction ([place]/2012.  12/2011.   "twice; 1 day apart;  during/after cath". NQMI in setting severe anemia 2013.   . NMarland Kitchenuropathy 2014   in feet, likely due to spinal stenosis, DDD, HNP  . Osteoarthritis 2002   spinal stenosis, spondylolisthesis, disc protrusion multilevel in lumbar spine  . Permanent atrial fibrillation 2012   a.  CHA2DS2VASc = 6-->eliquis;  b. 12/2015 Echo: EF 60-65%, no rwma, LVH, mild AS/MS/MR, sev dil LA, mild TR, PASP 23m368m  . Stroke (HCC)[place]16  . Upper GI bleed 08/2015   EGD: 2 small gastric AVMs, 1 actively bleeding.  Both treated with APC ablation, clipping of the bleeder    Past Surgical History:  Procedure Laterality Date  . APPENDECTOMY    . CARDIAC CATHETERIZATION  2012   50% LAD and luminal irreg in RCA, 1/12  Post cath MI from damage  . CARPAL TUNNEL RELEASE     left  . CARPAL TUNNEL RELEASE  10/11/11   Dr WhitRush BarerCATARACT EXTRACTION W/ INTRAOCULAR LENS IMPLANT  2/13   Dr EpesMontey HoraCOLONOSCOPY  2001, 02/2010   Dr KaplDeatra Ina. ESOPHAGOGASTRODUODENOSCOPY N/A 08/15/2015   CarlGatha Mayer 2 small gastric AVMs, 1 actively bleeding.  Both treated with APC ablation, clipping of the bleeder  . FOOT FRACTURE SURGERY     right heel repair  . FRACTURE  SURGERY    . KNEE ARTHROSCOPY     left, right knee 10/2009  . MASTOIDECTOMY     x3 on right  . NASAL SEPTUM SURGERY     nasal septal repair  . PENILE PROSTHESIS  REMOVAL    . PENILE PROSTHESIS IMPLANT     x 2--got infection after 2nd and had to remove  . PILONIDAL CYST / SINUS EXCISION    . SHOULDER ARTHROSCOPY     right  . TONSILLECTOMY AND ADENOIDECTOMY      Vitals:   03/03/19 0941  Pulse: 77  SpO2: 94%    Subjective Assessment - 03/03/19 0942    Subjective  Patient reports doing well today, claims his back is not hurting today.    Patient is accompained by:  Family member    Pertinent History  Patinet has been using a rollator for a year and 1/2, and a cane for 4 years before that. He has been having PT for the last 4 years. He has had  several falls.    How long can you stand comfortably?  15 mins    How long can you walk comfortably?  150 feet    Patient Stated Goals  Patient  wants to be stronger to be abel to stand and do activiites.    Currently in Pain?  No/denies    Pain Score  0-No pain    Pain Onset  More than a month ago         TREATMENT: Warm upwith gait in hallway x543fet with rollator with0seated rest breakduring ambulation, intermittent calls to stop and turn head to read playing card on walls; He did require seated rest break after walking, [id number]% patient exhibits increased shortness of breath; patient had 3 instances of shuffling gait to stop.    Patient instructed in advanced balance exercise   In 10MWT area: -forward walking with cones on each side reaching out to side #4 x3 sets challenging lateral reach and start/stop of gait; Patient requires CGA to min A throughout activity with cues for weight shift and step/foot placement for better dynamic balance safety; Patient often utilizes momentum to help maintain balance having difficulty slowing down steps due to imbalance; Required cues to slow down steps and picking up to challenge static stance control following dynamic movement; patient exhibits multiple stepping strategies due to poor ankle/hip strategies;   Patient claims back pain increased to 4/10 from bending over repeatedly.   STS x10, no armrest support and without AD, CGA with gait belt; initial 5 STS were unsteady and patient was unable to achieve standing position first try or unable to stand for more than 1 second without sitting back down; improved with repetition and cues to go slowly and focus on weight shift.  Attempted 4 square balance activity with stepping strategies without AD, CGA with gait belt; patient unable to maintain standing balance for longer than 5 seconds at a time without knee buckling, stepping strategies, and reaching for HHA.   In  parallel bars: Standing static balance feet apart, 0 HHA; 20 sec without touching bars followed by 30 sec.  Standing static balance feet together, 0 HHA; 5 sec without touching bars followed by 18 sec; cues to keep knees extended; patient claimed he felt fatigued in his low back and legs, which made the task more difficult.    Response to treatment: Patient often becomes short of breath, Vitals monitored, SPO294%, HR in high 70's, low 80's; multiple seated rest breaks offered  for patient to catch breath due to dyspnea on exertion; Patient requires min VCs for correct exercise technique for optimum muscle activation and strengthening; Instructed patient in advanced balance tasks,having patient stand unsupported with challenge to hip/ankle strategies;Patient required frequent cues for better weight shift and to activate hip/ankle strategies for less knee buckling and less reaction stepping; Patient had more difficulty this date with maintaining standing static balance unsupported, especially when achieving standing position from a squat/seated position.        PT Education - 03/03/19 0943    Education Details  exercise technique, balance/HEP    Person(s) Educated  Patient    Methods  Explanation;Demonstration    Comprehension  Verbalized understanding;Verbal cues required;Need further instruction;Returned demonstration       PT Short Term Goals - 02/10/19 0942      PT SHORT TERM GOAL #1   Title  Patient will be independent in home exercise program to improve strength/mobility for better functional independence with ADLs.    Time  4    Period  Weeks    Status  Achieved    Target Date  01/29/19      PT SHORT TERM GOAL #2   Title  Patient (> 51 years old) will complete five times sit to stand test in < 15 seconds indicating an increased LE strength and improved balance.    Time  4    Period  Weeks    Status  Partially Met    Target Date  01/29/19        PT Long Term Goals  - 02/26/19 0934      PT LONG TERM GOAL #1   Title  Patient will increase 10 meter walk test to >1.5ms as to improve gait speed for better community ambulation and to reduce fall risk.    Time  4    Period  Weeks    Status  Partially Met    Target Date  03/26/19      PT LONG TERM GOAL #2   Title  Patient will reduce timed up and go to <11 seconds to reduce fall risk and demonstrate improved transfer/gait ability.    Time  4    Period  Weeks    Status  Achieved    Target Date  03/26/19      PT LONG TERM GOAL #3   Title  Patient will increase BLE gross strength to 4+/5 as to improve functional strength for independent gait, increased standing tolerance and increased ADL ability.    Time  4    Period  Weeks    Status  Partially Met    Target Date  03/26/19            Plan - 03/03/19 0947    Clinical Impression Statement  Patient motivated for therapy, participated well within session. Had more difficulty with maintaining static balance unsupported this date compared to previous sessions, requiring min VC and min to mod assist to regain balance. Patient continues to be challenged by quick stops during ambulation, resulting in stepping strategies to maintain standing balance. Patient claimed that his low back pain became aggravated from bending over activities, causing his balance to become more unsteady. Patient would benefit from continued skilled PT to improve the deficits outlined in this note.    Personal Factors and Comorbidities  Age    Examination-Activity Limitations  Locomotion Level;Stand;Transfers    Examination-Participation Restrictions  Driving;Community Activity    Stability/Clinical Decision Making  Stable/Uncomplicated  Rehab Potential  Good    PT Frequency  2x / week    PT Duration  8 weeks    PT Treatment/Interventions  Therapeutic exercise;Therapeutic activities;Balance training;Neuromuscular re-education;Patient/family education;Manual techniques    PT Next  Visit Plan  gait in // bars, dynamic balance    Consulted and Agree with Plan of Care  Patient;Family member/caregiver       Patient will benefit from skilled therapeutic intervention in order to improve the following deficits and impairments:  Abnormal gait, Decreased activity tolerance, Decreased endurance, Decreased strength, Impaired flexibility, Difficulty walking, Decreased mobility, Cardiopulmonary status limiting activity  Visit Diagnosis: Muscle weakness (generalized)  Difficulty in walking, not elsewhere classified     Problem List Patient Active Problem List   Diagnosis Date Noted  . Dysuria 12/31/2017  . Constipation 12/31/2017  . CAD (coronary artery disease), native coronary artery 12/28/2017  . Bilateral carotid artery stenosis 12/04/2017  . Laceration of right leg excluding thigh 10/23/2017  . Alcohol dependence in remission ([place]) 06/24/2017  . Imbalance 02/05/2017  . Mitral regurgitation 07/17/2016  . Dyslipidemia 07/14/2016  . Encounter for anticoagulation discussion and counseling 05/03/2015  . Chronic anticoagulation 03/01/2015  . 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 TIA (transient ischemic attack) 12/31/2014  . Advanced directives, counseling/discussion 04/19/2014  . Spinal stenosis, lumbar region, with neurogenic claudication 12/23/2012  . Osteoarthritis, multiple sites 10/08/2012  . Preventative health care 02/25/2012  . MI, old with prior NSTEMI x 2 12/28/2011  . Chronic diastolic HF (heart failure) ([place]) 12/28/2011    Class: Acute  . Edema 09/20/2010  . Allergic rhinitis 11/11/2008  . Mood disorder ([place]) 04/14/2008  . Essential hypertension 04/14/2008  . Chronic atrial fibrillation 04/14/2008  . COPD GOLD II B 04/14/2008   Kindal Ponti A. Rosana Hoes, SPT This entire session was performed under direct supervision and direction of a licensed therapist/therapist assistant . I have personally read, edited and approve of the note as  written.  Trotter,Margaret PT, DPT 03/03/2019, 11:59 AM  [place] MAIN [facility] SERVICES [address] [place], [place], [address] Phone: [phone]   Fax:  [phone]  Name: Eduardo [name] MRN: [id number] Date of Birth: [birth year]

## 2019-03-09 ENCOUNTER — Ambulatory Visit: Payer: PPO | Admitting: Physical Therapy

## 2019-03-09 ENCOUNTER — Other Ambulatory Visit: Payer: Self-pay

## 2019-03-09 VITALS — HR 88

## 2019-03-09 DIAGNOSIS — R262 Difficulty in walking, not elsewhere classified: Secondary | ICD-10-CM

## 2019-03-09 DIAGNOSIS — M6281 Muscle weakness (generalized): Secondary | ICD-10-CM

## 2019-03-09 NOTE — Therapy (Signed)
[place] MAIN [facility] SERVICES [address] [place], [place], [address] Phone: [phone]   Fax:  [phone]  Physical Therapy Treatment  Patient Details  Name: Eduardo [name] MRN: [id number] Date of Birth: Oct 15, 1930 Referring Provider (PT): [name]   Encounter Date: 03/09/2019  PT End of Session - 03/09/19 1204    Visit Number  18    Number of Visits  25    Date for PT Re-Evaluation  03/26/19    PT Start Time  1059    PT Stop Time  1145    PT Time Calculation (min)  46 min    Equipment Utilized During Treatment  Gait belt    Activity Tolerance  Patient tolerated treatment well    Behavior During Therapy  [facility] for tasks assessed/performed       Past Medical History:  Diagnosis Date  . Allergy   . Anemia 2012, 08/2015   chronic. Acute due to large hematoma 2013. 2017: due to gastric AVM bleeding.   . Anxiety   . CAD (coronary artery disease)    a. 06/2010 Cath: LM nl, LAD 50p/m, LCX 23m RCA 50p/m -->catheter dissection but good flow, 70d, RPDA 50-70;  b. 06/2010 Relook cath due to bradycardia and inf ST elev: RCA dissection @ ostium extending into coronary cusp and prox RCA, Ao dissection-->less staining than previously-->Med Rx.  . Chronic combined systolic (congestive) and diastolic (congestive) heart failure ([place]    a. 12/2014 Echo: EF 60-65%; b. 07/2016 Echo: EF 45-50%, mild LVH. Mild AS/MS. Mod-sev MR. Mild to mod TR. PASP 575mg; c. 08/2018 Echo: EF 55-60%, RVSP 68.20m80m. Sev dil LA, mod dil RA. at least mod MR, mild to mod TR. Mild AS.  . CMarland KitchenPD (chronic obstructive pulmonary disease) ([id number]   exertional dyspnea  . Gastric angiodysplasia with hemorrhage 08/2015  . Hx of colonic polyps 2001, 2011   adenomatous 2001. HP 2001, 2011.  . HMarland Kitchenperlipidemia   . Mitral regurgitation    a. 07/2016 Echo: Mod to sev MR; b. 08/2018 Echo: at least moderate MR.  . Myocardial infarction (HCCHazard/2012.  12/2011.   "twice; 1 day apart;  during/after cath". NQMI in setting severe anemia 2013.   . NMarland Kitchenuropathy 2014   in feet, likely due to spinal stenosis, DDD, HNP  . Osteoarthritis 2002   spinal stenosis, spondylolisthesis, disc protrusion multilevel in lumbar spine  . Permanent atrial fibrillation 2012   a.  CHA2DS2VASc = 6-->eliquis;  b. 12/2015 Echo: EF 60-65%, no rwma, LVH, mild AS/MS/MR, sev dil LA, mild TR, PASP 66m14m  . Stroke (HCC)[id number]  . Upper GI bleed 08/2015   EGD: 2 small gastric AVMs, 1 actively bleeding.  Both treated with APC ablation, clipping of the bleeder    Past Surgical History:  Procedure Laterality Date  . APPENDECTOMY    . CARDIAC CATHETERIZATION  2012   50% LAD and luminal irreg in RCA, 1/12  Post cath MI from damage  . CARPAL TUNNEL RELEASE     left  . CARPAL TUNNEL RELEASE  10/11/11   Dr WhitRush BarerCATARACT EXTRACTION W/ INTRAOCULAR LENS IMPLANT  2/13   Dr EpesMontey HoraCOLONOSCOPY  2001, 02/2010   Dr KaplDeatra Ina. ESOPHAGOGASTRODUODENOSCOPY N/A 08/15/2015   CarlGatha Mayer 2 small gastric AVMs, 1 actively bleeding.  Both treated with APC ablation, clipping of the bleeder  . FOOT FRACTURE SURGERY     right heel repair  . FRACTURE  SURGERY    . KNEE ARTHROSCOPY     left, right knee 10/2009  . MASTOIDECTOMY     x3 on right  . NASAL SEPTUM SURGERY     nasal septal repair  . PENILE PROSTHESIS  REMOVAL    . PENILE PROSTHESIS IMPLANT     x 2--got infection after 2nd and had to remove  . PILONIDAL CYST / SINUS EXCISION    . SHOULDER ARTHROSCOPY     right  . TONSILLECTOMY AND ADENOIDECTOMY      Vitals:   03/09/19 1115  Pulse: 88  SpO2: 94%    Subjective Assessment - 03/09/19 1118    Subjective  Patient reports slight low back pain, described as left-sided "sciatica" pain that does not bother him too much.    Patient is accompained by:  Family member    Pertinent History  Patinet has been using a rollator for a year and 1/2, and a cane for 4 years before that. He has been  having PT for the last 4 years. He has had several falls.    How long can you stand comfortably?  15 mins    How long can you walk comfortably?  150 feet    Patient Stated Goals  Patient  wants to be stronger to be abel to stand and do activiites.    Currently in Pain?  Yes    Pain Score  2     Pain Location  Back    Pain Orientation  Lower;Left    Pain Descriptors / Indicators  Aching    Pain Type  Chronic pain    Pain Onset  More than a month ago    Pain Frequency  Intermittent    Aggravating Factors   bending/stooping    Pain Relieving Factors  rest    Effect of Pain on Daily Activities  decreased activity tolerance;    Multiple Pain Sites  No        TREATMENT: Warm upwith gait in hallway x321fet (1657fwith rollator, 160101fo AD, CGA) with0seated rest breakduring ambulation, intermittent calls to stop and touch playing card/wall on either right or left side; He did require seated rest break after walking, [id number]%atient exhibits increased shortness of breath;   Patient instructed in advanced balance exercise   In 10MWT area: Instructed patient in obstacle course: Sit<>Stand from regular chair unsupported x3 reps Weaving around 2 cones Moving SAEBO balls up/down 3 rungs each UE Stepping over cane Picking up cones #4 left/right having to walk to each side and slow down to pick up cone Tossing basketball x3 attempt Instructed patient in obstacle course x2 sets; He does fatigue quickly requiring rest break after each set; Require min A for safety with intermittent loss of balance often buckling knees with poor hip/ankle strategies;   Step ups to 6 inch step with 1 rail assist x 10  Reps each LE    Instructed patient in  4 square balance activity x 2 min x2 sets, forward/backward with stepping strategies without AD, CGA with gait belt; patient unable to maintain standing balance for longer than 5 seconds at a time without knee buckling, stepping  strategies, and reaching for HHA. Instructed patient to increase step length especially with backwards stepping for improved foot clearance and better reciprocal stepping. With bigger backwards stepping patient requires multiple stepping strategies to recover balance, often falling backwards;    Response to treatment: Patient often becomes short of breath, Vitals monitored, SPO294%, HR in high  80's to low70's; multiple seated rest breaks offered for patient to catch breath due to dyspnea on exertion; Patient requires min VCs for correct exercise technique for optimum muscle activation and strengthening; Instructed patient in advanced balance tasks,having patient stand unsupported with challenge to hip/ankle strategies;Patient required frequent cues for better weight shift and to activate hip/ankle strategies for less knee buckling and less reaction stepping; Patient demonstrated improvements this date with less frequent knee buckling in static standing balance, although knee buckling increases with bigger step length, bending over, and general fatigue. Patient instructed to shorten step length when walking without AD at home for gait safety.        PT Education - 03/09/19 1202    Education Details  exercise technique, balance/HEP    Person(s) Educated  Patient    Methods  Explanation;Demonstration    Comprehension  Verbalized understanding;Verbal cues required;Need further instruction;Returned demonstration       PT Short Term Goals - 02/10/19 0942      PT SHORT TERM GOAL #1   Title  Patient will be independent in home exercise program to improve strength/mobility for better functional independence with ADLs.    Time  4    Period  Weeks    Status  Achieved    Target Date  01/29/19      PT SHORT TERM GOAL #2   Title  Patient (> 43 years old) will complete five times sit to stand test in < 15 seconds indicating an increased LE strength and improved balance.    Time  4     Period  Weeks    Status  Partially Met    Target Date  01/29/19        PT Long Term Goals - 02/26/19 0934      PT LONG TERM GOAL #1   Title  Patient will increase 10 meter walk test to >1.21ms as to improve gait speed for better community ambulation and to reduce fall risk.    Time  4    Period  Weeks    Status  Partially Met    Target Date  03/26/19      PT LONG TERM GOAL #2   Title  Patient will reduce timed up and go to <11 seconds to reduce fall risk and demonstrate improved transfer/gait ability.    Time  4    Period  Weeks    Status  Achieved    Target Date  03/26/19      PT LONG TERM GOAL #3   Title  Patient will increase BLE gross strength to 4+/5 as to improve functional strength for independent gait, increased standing tolerance and increased ADL ability.    Time  4    Period  Weeks    Status  Partially Met    Target Date  03/26/19            Plan - 03/09/19 1205    Clinical Impression Statement  Patient motivated for therapy, participated well within sesison. Patient demonstrated improvements in maintaining static standing balance today compared to previous session, requiring less stepping strategies and knee buckling to remain upright with min VC. Patient balance continues to be challenged by bending over, starting/stopping ambulation, and taking big steps, requiring min to mod assist and VC to regain balance. Patient's low back pain did not increase with today's session. Patient would benefit from continued skilled PT to improve the impairments and deficits outlined in this note.    Personal Factors  and Comorbidities  Age    Examination-Activity Limitations  Locomotion Level;Stand;Transfers    Examination-Participation Restrictions  Driving;Community Activity    Stability/Clinical Decision Making  Stable/Uncomplicated    Rehab Potential  Good    PT Frequency  2x / week    PT Duration  8 weeks    PT Treatment/Interventions  Therapeutic exercise;Therapeutic  activities;Balance training;Neuromuscular re-education;Patient/family education;Manual techniques    PT Next Visit Plan  gait in // bars, dynamic balance    Consulted and Agree with Plan of Care  Patient;Family member/caregiver       Patient will benefit from skilled therapeutic intervention in order to improve the following deficits and impairments:  Abnormal gait, Decreased activity tolerance, Decreased endurance, Decreased strength, Impaired flexibility, Difficulty walking, Decreased mobility, Cardiopulmonary status limiting activity  Visit Diagnosis: Muscle weakness (generalized)  Difficulty in walking, not elsewhere classified     Problem List Patient Active Problem List   Diagnosis Date Noted  . Dysuria 12/31/2017  . Constipation 12/31/2017  . CAD (coronary artery disease), native coronary artery 12/28/2017  . Bilateral carotid artery stenosis 12/04/2017  . Laceration of right leg excluding thigh 10/23/2017  . Alcohol dependence in remission ([place]) 06/24/2017  . Imbalance 02/05/2017  . Mitral regurgitation 07/17/2016  . Dyslipidemia 07/14/2016  . Encounter for anticoagulation discussion and counseling 05/03/2015  . Chronic anticoagulation 03/01/2015  . 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 TIA (transient ischemic attack) 12/31/2014  . Advanced directives, counseling/discussion 04/19/2014  . Spinal stenosis, lumbar region, with neurogenic claudication 12/23/2012  . Osteoarthritis, multiple sites 10/08/2012  . Preventative health care 02/25/2012  . MI, old with prior NSTEMI x 2 12/28/2011  . Chronic diastolic HF (heart failure) ([place]) 12/28/2011    Class: Acute  . Edema 09/20/2010  . Allergic rhinitis 11/11/2008  . Mood disorder ([place]) 04/14/2008  . Essential hypertension 04/14/2008  . Chronic atrial fibrillation 04/14/2008  . COPD GOLD II B 04/14/2008   Herve Haug A. Rosana Hoes, SPT This entire session was performed under direct supervision and direction  of a licensed therapist/therapist assistant . I have personally read, edited and approve of the note as written.  Trotter,Margaret PT, DPT 03/09/2019, 2:22 PM  [place] MAIN [facility] SERVICES [address] [place], [place], [address] Phone: [phone]   Fax:  [phone]  Name: Eduardo [name] MRN: [id number] Date of Birth: 1930-09-17

## 2019-03-10 ENCOUNTER — Ambulatory Visit (INDEPENDENT_AMBULATORY_CARE_PROVIDER_SITE_OTHER): Payer: PPO

## 2019-03-10 DIAGNOSIS — Z23 Encounter for immunization: Secondary | ICD-10-CM

## 2019-03-11 ENCOUNTER — Ambulatory Visit: Payer: PPO | Admitting: Physical Therapy

## 2019-03-11 ENCOUNTER — Encounter: Payer: Self-pay | Admitting: Physical Therapy

## 2019-03-11 ENCOUNTER — Other Ambulatory Visit: Payer: Self-pay

## 2019-03-11 VITALS — HR 79

## 2019-03-11 DIAGNOSIS — M6281 Muscle weakness (generalized): Secondary | ICD-10-CM

## 2019-03-11 DIAGNOSIS — R262 Difficulty in walking, not elsewhere classified: Secondary | ICD-10-CM

## 2019-03-11 NOTE — Therapy (Addendum)
[place] MAIN [facility] SERVICES [address] [place], [place], [address] Phone: [phone]   Fax:  [phone]  Physical Therapy Treatment  Patient Details  Name: Eduardo [name] MRN: [id number] Date of Birth: 03-Feb-1931 Referring Provider (PT): [name]   Encounter Date: 03/11/2019  PT End of Session - 03/11/19 1230    Visit Number  19    Number of Visits  25    Date for PT Re-Evaluation  03/26/19    PT Start Time  1146    PT Stop Time  1230    PT Time Calculation (min)  44 min    Equipment Utilized During Treatment  Gait belt    Activity Tolerance  Patient tolerated treatment well    Behavior During Therapy  [facility] for tasks assessed/performed       Past Medical History:  Diagnosis Date  . Allergy   . Anemia 2012, 08/2015   chronic. Acute due to large hematoma 2013. 2017: due to gastric AVM bleeding.   . Anxiety   . CAD (coronary artery disease)    a. 06/2010 Cath: LM nl, LAD 50p/m, LCX 62m RCA 50p/m -->catheter dissection but good flow, 70d, RPDA 50-70;  b. 06/2010 Relook cath due to bradycardia and inf ST elev: RCA dissection @ ostium extending into coronary cusp and prox RCA, Ao dissection-->less staining than previously-->Med Rx.  . Chronic combined systolic (congestive) and diastolic (congestive) heart failure ([place]    a. 12/2014 Echo: EF 60-65%; b. 07/2016 Echo: EF 45-50%, mild LVH. Mild AS/MS. Mod-sev MR. Mild to mod TR. PASP 523mg; c. 08/2018 Echo: EF 55-60%, RVSP 68.81m481m. Sev dil LA, mod dil RA. at least mod MR, mild to mod TR. Mild AS.  . CMarland KitchenPD (chronic obstructive pulmonary disease) ([id number]   exertional dyspnea  . Gastric angiodysplasia with hemorrhage 08/2015  . Hx of colonic polyps 2001, 2011   adenomatous 2001. HP 2001, 2011.  . HMarland Kitchenperlipidemia   . Mitral regurgitation    a. 07/2016 Echo: Mod to sev MR; b. 08/2018 Echo: at least moderate MR.  . Myocardial infarction (HCC[place]/2012.  12/2011.   "twice; 1 day apart;  during/after cath". NQMI in setting severe anemia 2013.   . NMarland Kitchenuropathy 2014   in feet, likely due to spinal stenosis, DDD, HNP  . Osteoarthritis 2002   spinal stenosis, spondylolisthesis, disc protrusion multilevel in lumbar spine  . Permanent atrial fibrillation 2012   a.  CHA2DS2VASc = 6-->eliquis;  b. 12/2015 Echo: EF 60-65%, no rwma, LVH, mild AS/MS/MR, sev dil LA, mild TR, PASP 51m105m  . Stroke (HCC)[place]16  . Upper GI bleed 08/2015   EGD: 2 small gastric AVMs, 1 actively bleeding.  Both treated with APC ablation, clipping of the bleeder    Past Surgical History:  Procedure Laterality Date  . APPENDECTOMY    . CARDIAC CATHETERIZATION  2012   50% LAD and luminal irreg in RCA, 1/12  Post cath MI from damage  . CARPAL TUNNEL RELEASE     left  . CARPAL TUNNEL RELEASE  10/11/11   Dr WhitRush BarerCATARACT EXTRACTION W/ INTRAOCULAR LENS IMPLANT  2/13   Dr EpesMontey HoraCOLONOSCOPY  2001, 02/2010   Dr KaplDeatra Ina. ESOPHAGOGASTRODUODENOSCOPY N/A 08/15/2015   CarlGatha Mayer 2 small gastric AVMs, 1 actively bleeding.  Both treated with APC ablation, clipping of the bleeder  . FOOT FRACTURE SURGERY     right heel repair  . FRACTURE  SURGERY    . KNEE ARTHROSCOPY     left, right knee 10/2009  . MASTOIDECTOMY     x3 on right  . NASAL SEPTUM SURGERY     nasal septal repair  . PENILE PROSTHESIS  REMOVAL    . PENILE PROSTHESIS IMPLANT     x 2--got infection after 2nd and had to remove  . PILONIDAL CYST / SINUS EXCISION    . SHOULDER ARTHROSCOPY     right  . TONSILLECTOMY AND ADENOIDECTOMY      Vitals:   03/11/19 1153  Pulse: 79  SpO2: 93%    Subjective Assessment - 03/11/19 1224    Subjective  Patient is doing well today, no reports of pain today but experiencing some fatigue in his legs. Reports that he had a near fall yesterday when operating his dishwasher, was able to regain standing balance.    Patient is accompained by:  Family member    Pertinent History  Patinet  has been using a rollator for a year and 1/2, and a cane for 4 years before that. He has been having PT for the last 4 years. He has had several falls.    How long can you stand comfortably?  15 mins    How long can you walk comfortably?  150 feet    Patient Stated Goals  Patient  wants to be stronger to be abel to stand and do activiites.    Currently in Pain?  No/denies    Pain Score  0-No pain    Pain Onset  More than a month ago    Multiple Pain Sites  No         TREATMENT: Warm up with gait in hallway x320 feet without rollator, CGA with 1 seated rest break halfway; intermittent calls to stop and touch playing card/wall on either right or left side; He did require seated rest break after walking, HR 79 SPO2 93%, patient exhibits increased shortness of breath;   Patient instructed in advanced balance exercise   In 10MWT area: Instructed patient in obstacle course:   Sit<>Stand from regular chair unsupported x5 reps  Weaving around 3 cones,  sidestep to SAEBO Moving SAEBO balls up/down 3 rungs each UE  Side-step over cane  Forward step over orange hurdle  Picking up cones #6 left/right having to walk to each side and slow down to pick up cone  Tossing basketball x3 attempt Instructed patient in obstacle course x2 sets;   He does fatigue quickly requiring rest break prior to reaching basketball station; Require min A for safety with intermittent loss of balance often buckling knees with poor hip/ankle strategies;   Side-step in TUG area, bend over to touch cone on either side, x3 laps; timed to 1:42 sec with 5 instances of min A to return to standing balance.   Response to treatment: Patient often becomes short of breath, Vitals monitored, SPO2 94%, HR in high 80's to low 70's; multiple seated rest breaks offered for patient to catch breath due to dyspnea on exertion; Patient requires min VCs for correct exercise technique for optimum muscle activation and strengthening; Instructed  patient in advanced balance tasks, having patient stand unsupported with challenge to hip/ankle strategies; Patient required frequent cues for better weight shift and to activate hip/ankle strategies for less knee buckling and less reaction stepping; Patient demonstrated improvements this date with less frequent knee buckling in static standing balance, although knee buckling increases with bigger step length, bending over, and general  fatigue.         PT Education - 03/11/19 1229    Education Details  exercise technique, balance/HEP    Person(s) Educated  Patient    Methods  Explanation;Demonstration    Comprehension  Verbalized understanding;Returned demonstration;Verbal cues required;Need further instruction       PT Short Term Goals - 02/10/19 6629      PT SHORT TERM GOAL #1   Title  Patient will be independent in home exercise program to improve strength/mobility for better functional independence with ADLs.    Time  4    Period  Weeks    Status  Achieved    Target Date  01/29/19      PT SHORT TERM GOAL #2   Title  Patient (> 70 years old) will complete five times sit to stand test in < 15 seconds indicating an increased LE strength and improved balance.    Time  4    Period  Weeks    Status  Partially Met    Target Date  01/29/19        PT Long Term Goals - 02/26/19 0934      PT LONG TERM GOAL #1   Title  Patient will increase 10 meter walk test to >1.59ms as to improve gait speed for better community ambulation and to reduce fall risk.    Time  4    Period  Weeks    Status  Partially Met    Target Date  03/26/19      PT LONG TERM GOAL #2   Title  Patient will reduce timed up and go to <11 seconds to reduce fall risk and demonstrate improved transfer/gait ability.    Time  4    Period  Weeks    Status  Achieved    Target Date  03/26/19      PT LONG TERM GOAL #3   Title  Patient will increase BLE gross strength to 4+/5 as to improve functional strength for  independent gait, increased standing tolerance and increased ADL ability.    Time  4    Period  Weeks    Status  Partially Met    Target Date  03/26/19            Plan - 03/12/19 04765   Clinical Impression Statement  Patient demonstrated increased knee instability this date due to LE and low back fatigue, resulting in a more unsteady gait pattern and more frequent LOB. Patient was unable to complete hallway walk or obstacle course progressions without a seated rest break. Side/side walk in TUG area was timed at 1 minute 42 seconds with about 5 episodes of PT using gait belt to assist with regaining balance. Patient would continue to benefit from skilled PT to improve the deficits outlined in this note.    Personal Factors and Comorbidities  Age    Examination-Activity Limitations  Locomotion Level;Stand;Transfers    Examination-Participation Restrictions  Driving;Community Activity    Stability/Clinical Decision Making  Stable/Uncomplicated    Rehab Potential  Good    PT Frequency  2x / week    PT Duration  8 weeks    PT Treatment/Interventions  Therapeutic exercise;Therapeutic activities;Balance training;Neuromuscular re-education;Patient/family education;Manual techniques    PT Next Visit Plan  gait in // bars, dynamic balance    Consulted and Agree with Plan of Care  Patient;Family member/caregiver       Patient will benefit from skilled therapeutic intervention in order to improve  the following deficits and impairments:  Abnormal gait, Decreased activity tolerance, Decreased endurance, Decreased strength, Impaired flexibility, Difficulty walking, Decreased mobility, Cardiopulmonary status limiting activity  Visit Diagnosis: Muscle weakness (generalized)  Difficulty in walking, not elsewhere classified     Problem List Patient Active Problem List   Diagnosis Date Noted  . Dysuria 12/31/2017  . Constipation 12/31/2017  . CAD (coronary artery disease), native coronary  artery 12/28/2017  . Bilateral carotid artery stenosis 12/04/2017  . Laceration of right leg excluding thigh 10/23/2017  . Alcohol dependence in remission ([place]) 06/24/2017  . Imbalance 02/05/2017  . Mitral regurgitation 07/17/2016  . Dyslipidemia 07/14/2016  . Encounter for anticoagulation discussion and counseling 05/03/2015  . Chronic anticoagulation 03/01/2015  . 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 TIA (transient ischemic attack) 12/31/2014  . Advanced directives, counseling/discussion 04/19/2014  . Spinal stenosis, lumbar region, with neurogenic claudication 12/23/2012  . Osteoarthritis, multiple sites 10/08/2012  . Preventative health care 02/25/2012  . MI, old with prior NSTEMI x 2 12/28/2011  . Chronic diastolic HF (heart failure) ([place]) 12/28/2011    Class: Acute  . Edema 09/20/2010  . Allergic rhinitis 11/11/2008  . Mood disorder ([place]) 04/14/2008  . Essential hypertension 04/14/2008  . Chronic atrial fibrillation ([place]) 04/14/2008  . COPD GOLD II B 04/14/2008   Alean Rinne SPT This entire session was performed under direct supervision and direction of a licensed therapist/therapist assistant . I have personally read, edited and approve of the note as written.  Trotter,Margaret PT, DPT 03/12/2019, 8:20 AM  [place] MAIN [facility] SERVICES [address] [place], [place], [address] Phone: [phone]   Fax:  [phone]  Name: MIKHAI BIENVENUE MRN: [id number] Date of Birth: 1930-11-12

## 2019-03-11 NOTE — Therapy (Deleted)
[place] MAIN [facility] SERVICES [address] [place], [place], [address] Phone: [phone]   Fax:  [phone]  Physical Therapy Treatment  Patient Details  Name: Eduardo [name] MRN: [id number] Date of Birth: 03-Feb-1931 Referring Provider (PT): [name]   Encounter Date: 03/11/2019  PT End of Session - 03/11/19 1230    Visit Number  19    Number of Visits  25    Date for PT Re-Evaluation  03/26/19    PT Start Time  1146    PT Stop Time  1230    PT Time Calculation (min)  44 min    Equipment Utilized During Treatment  Gait belt    Activity Tolerance  Patient tolerated treatment well    Behavior During Therapy  [facility] for tasks assessed/performed       Past Medical History:  Diagnosis Date  . Allergy   . Anemia 2012, 08/2015   chronic. Acute due to large hematoma 2013. 2017: due to gastric AVM bleeding.   . Anxiety   . CAD (coronary artery disease)    a. 06/2010 Cath: LM nl, LAD 50p/m, LCX 62m RCA 50p/m -->catheter dissection but good flow, 70d, RPDA 50-70;  b. 06/2010 Relook cath due to bradycardia and inf ST elev: RCA dissection @ ostium extending into coronary cusp and prox RCA, Ao dissection-->less staining than previously-->Med Rx.  . Chronic combined systolic (congestive) and diastolic (congestive) heart failure ([place]    a. 12/2014 Echo: EF 60-65%; b. 07/2016 Echo: EF 45-50%, mild LVH. Mild AS/MS. Mod-sev MR. Mild to mod TR. PASP 523mg; c. 08/2018 Echo: EF 55-60%, RVSP 68.81m481m. Sev dil LA, mod dil RA. at least mod MR, mild to mod TR. Mild AS.  . CMarland KitchenPD (chronic obstructive pulmonary disease) (HCC[place]013   exertional dyspnea  . Gastric angiodysplasia with hemorrhage 08/2015  . Hx of colonic polyps 2001, 2011   adenomatous 2001. HP 2001, 2011.  . HMarland Kitchenperlipidemia   . Mitral regurgitation    a. 07/2016 Echo: Mod to sev MR; b. 08/2018 Echo: at least moderate MR.  . Myocardial infarction (HCC[place]/2012.  12/2011.   "twice; 1 day apart;  during/after cath". NQMI in setting severe anemia 2013.   . NMarland Kitchenuropathy 2014   in feet, likely due to spinal stenosis, DDD, HNP  . Osteoarthritis 2002   spinal stenosis, spondylolisthesis, disc protrusion multilevel in lumbar spine  . Permanent atrial fibrillation 2012   a.  CHA2DS2VASc = 6-->eliquis;  b. 12/2015 Echo: EF 60-65%, no rwma, LVH, mild AS/MS/MR, sev dil LA, mild TR, PASP 51m105m  . Stroke (HCC)[place]16  . Upper GI bleed 08/2015   EGD: 2 small gastric AVMs, 1 actively bleeding.  Both treated with APC ablation, clipping of the bleeder    Past Surgical History:  Procedure Laterality Date  . APPENDECTOMY    . CARDIAC CATHETERIZATION  2012   50% LAD and luminal irreg in RCA, 1/12  Post cath MI from damage  . CARPAL TUNNEL RELEASE     left  . CARPAL TUNNEL RELEASE  10/11/11   Dr WhitRush BarerCATARACT EXTRACTION W/ INTRAOCULAR LENS IMPLANT  2/13   Dr EpesMontey HoraCOLONOSCOPY  2001, 02/2010   Dr KaplDeatra Ina. ESOPHAGOGASTRODUODENOSCOPY N/A 08/15/2015   CarlGatha Mayer 2 small gastric AVMs, 1 actively bleeding.  Both treated with APC ablation, clipping of the bleeder  . FOOT FRACTURE SURGERY     right heel repair  . FRACTURE  SURGERY    . KNEE ARTHROSCOPY     left, right knee 10/2009  . MASTOIDECTOMY     x3 on right  . NASAL SEPTUM SURGERY     nasal septal repair  . PENILE PROSTHESIS  REMOVAL    . PENILE PROSTHESIS IMPLANT     x 2--got infection after 2nd and had to remove  . PILONIDAL CYST / SINUS EXCISION    . SHOULDER ARTHROSCOPY     right  . TONSILLECTOMY AND ADENOIDECTOMY      Vitals:   03/11/19 1153  Pulse: 79  SpO2: 93%    Subjective Assessment - 03/11/19 1224    Subjective  Patient is doing well today, no reports of pain today but experiencing some fatigue in his legs. Reports that he had a near fall yesterday when operating his dishwasher, was able to regain standing balance.    Patient is accompained by:  Family member    Pertinent History  Patinet  has been using a rollator for a year and 1/2, and a cane for 4 years before that. He has been having PT for the last 4 years. He has had several falls.    How long can you stand comfortably?  15 mins    How long can you walk comfortably?  150 feet    Patient Stated Goals  Patient  wants to be stronger to be abel to stand and do activiites.    Currently in Pain?  No/denies    Pain Score  0-No pain    Pain Onset  More than a month ago    Multiple Pain Sites  No                Instructed patient in balance obstacle course without AD x2 sets;                PT Education - 03/11/19 1229    Education Details  exercise technique, balance/HEP    Person(s) Educated  Patient    Methods  Explanation;Demonstration    Comprehension  Verbalized understanding;Returned demonstration;Verbal cues required;Need further instruction       PT Short Term Goals - 02/10/19 0942      PT SHORT TERM GOAL #1   Title  Patient will be independent in home exercise program to improve strength/mobility for better functional independence with ADLs.    Time  4    Period  Weeks    Status  Achieved    Target Date  01/29/19      PT SHORT TERM GOAL #2   Title  Patient (> 27 years old) will complete five times sit to stand test in < 15 seconds indicating an increased LE strength and improved balance.    Time  4    Period  Weeks    Status  Partially Met    Target Date  01/29/19        PT Long Term Goals - 02/26/19 0934      PT LONG TERM GOAL #1   Title  Patient will increase 10 meter walk test to >1.2ms as to improve gait speed for better community ambulation and to reduce fall risk.    Time  4    Period  Weeks    Status  Partially Met    Target Date  03/26/19      PT LONG TERM GOAL #2   Title  Patient will reduce timed up and go to <11 seconds to reduce fall risk and  demonstrate improved transfer/gait ability.    Time  4    Period  Weeks    Status  Achieved    Target Date   03/26/19      PT LONG TERM GOAL #3   Title  Patient will increase BLE gross strength to 4+/5 as to improve functional strength for independent gait, increased standing tolerance and increased ADL ability.    Time  4    Period  Weeks    Status  Partially Met    Target Date  03/26/19              Patient will benefit from skilled therapeutic intervention in order to improve the following deficits and impairments:     Visit Diagnosis: Muscle weakness (generalized)  Difficulty in walking, not elsewhere classified     Problem List Patient Active Problem List   Diagnosis Date Noted  . Dysuria 12/31/2017  . Constipation 12/31/2017  . CAD (coronary artery disease), native coronary artery 12/28/2017  . Bilateral carotid artery stenosis 12/04/2017  . Laceration of right leg excluding thigh 10/23/2017  . Alcohol dependence in remission ([place]) 06/24/2017  . Imbalance 02/05/2017  . Mitral regurgitation 07/17/2016  . Dyslipidemia 07/14/2016  . Encounter for anticoagulation discussion and counseling 05/03/2015  . Chronic anticoagulation 03/01/2015  . 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 TIA (transient ischemic attack) 12/31/2014  . Advanced directives, counseling/discussion 04/19/2014  . Spinal stenosis, lumbar region, with neurogenic claudication 12/23/2012  . Osteoarthritis, multiple sites 10/08/2012  . Preventative health care 02/25/2012  . MI, old with prior NSTEMI x 2 12/28/2011  . Chronic diastolic HF (heart failure) ([place]) 12/28/2011    Class: Acute  . Edema 09/20/2010  . Allergic rhinitis 11/11/2008  . Mood disorder ([place]) 04/14/2008  . Essential hypertension 04/14/2008  . Chronic atrial fibrillation 04/14/2008  . COPD GOLD II B 04/14/2008    Alenah Sarria PT, DPT 03/11/2019, 4:00 PM  [place] MAIN [facility] SERVICES [address] [place], [place], [address] Phone: [phone]   Fax:   [phone]  Name: MANUAL NAVARRA MRN: [id number] Date of Birth: 11-19-1930

## 2019-03-18 ENCOUNTER — Ambulatory Visit: Payer: PPO | Attending: Internal Medicine | Admitting: Physical Therapy

## 2019-03-18 ENCOUNTER — Ambulatory Visit: Payer: PPO | Admitting: Nurse Practitioner

## 2019-03-18 ENCOUNTER — Other Ambulatory Visit: Payer: Self-pay

## 2019-03-18 ENCOUNTER — Encounter: Payer: Self-pay | Admitting: Physical Therapy

## 2019-03-18 VITALS — HR 86

## 2019-03-18 DIAGNOSIS — M6281 Muscle weakness (generalized): Secondary | ICD-10-CM | POA: Insufficient documentation

## 2019-03-18 DIAGNOSIS — R262 Difficulty in walking, not elsewhere classified: Secondary | ICD-10-CM | POA: Insufficient documentation

## 2019-03-18 NOTE — Therapy (Signed)
[place] MAIN [facility] SERVICES [address] [place], [place], [address] Phone: [phone]   Fax:  [phone]  Physical Therapy Treatment  Physical Therapy Progress Note   Dates of reporting period  02/10/19   to  03/18/19    Patient Details  Name: Eduardo [name] MRN: [id number] Date of Birth: 06-Sep-1930 Referring Provider (PT): [name]   Encounter Date: 03/18/2019  PT End of Session - 03/18/19 1101    Visit Number  20    Number of Visits  25    Date for PT Re-Evaluation  03/26/19    PT Start Time  1102    PT Stop Time  1145    PT Time Calculation (min)  43 min    Equipment Utilized During Treatment  Gait belt    Activity Tolerance  Patient tolerated treatment well    Behavior During Therapy  WFL for tasks assessed/performed       Past Medical History:  Diagnosis Date  . Allergy   . Anemia 2012, 08/2015   chronic. Acute due to large hematoma 2013. 2017: due to gastric AVM bleeding.   . Anxiety   . CAD (coronary artery disease)    a. 06/2010 Cath: LM nl, LAD 50p/m, LCX 53m RCA 50p/m -->catheter dissection but good flow, 70d, RPDA 50-70;  b. 06/2010 Relook cath due to bradycardia and inf ST elev: RCA dissection @ ostium extending into coronary cusp and prox RCA, Ao dissection-->less staining than previously-->Med Rx.  . Chronic combined systolic (congestive) and diastolic (congestive) heart failure ([place]    a. 12/2014 Echo: EF 60-65%; b. 07/2016 Echo: EF 45-50%, mild LVH. Mild AS/MS. Mod-sev MR. Mild to mod TR. PASP 559mg; c. 08/2018 Echo: EF 55-60%, RVSP 68.26m14m. Sev dil LA, mod dil RA. at least mod MR, mild to mod TR. Mild AS.  . CMarland KitchenPD (chronic obstructive pulmonary disease) (HCCLiberty Lake013   exertional dyspnea  . Gastric angiodysplasia with hemorrhage 08/2015  . Hx of colonic polyps 2001, 2011   adenomatous 2001. HP 2001, 2011.  . HMarland Kitchenperlipidemia   . Mitral regurgitation    a. 07/2016 Echo: Mod to sev MR; b. 08/2018 Echo: at  least moderate MR.  . Myocardial infarction (HCCR[place]/2012.  12/2011.   "twice; 1 day apart; during/after cath". NQMI in setting severe anemia 2013.   . NMarland Kitchenuropathy 2014   in feet, likely due to spinal stenosis, DDD, HNP  . Osteoarthritis 2002   spinal stenosis, spondylolisthesis, disc protrusion multilevel in lumbar spine  . Permanent atrial fibrillation (HCC[place]012   a.  CHA2DS2VASc = 6-->eliquis;  b. 12/2015 Echo: EF 60-65%, no rwma, LVH, mild AS/MS/MR, sev dil LA, mild TR, PASP 32m75m  . Stroke (HCC)[place]16  . Upper GI bleed 08/2015   EGD: 2 small gastric AVMs, 1 actively bleeding.  Both treated with APC ablation, clipping of the bleeder    Past Surgical History:  Procedure Laterality Date  . APPENDECTOMY    . CARDIAC CATHETERIZATION  2012   50% LAD and luminal irreg in RCA, 1/12  Post cath MI from damage  . CARPAL TUNNEL RELEASE     left  . CARPAL TUNNEL RELEASE  10/11/11   Dr WhitRush BarerCATARACT EXTRACTION W/ INTRAOCULAR LENS IMPLANT  2/13   Dr EpesMontey HoraCOLONOSCOPY  2001, 02/2010   Dr KaplDeatra Ina. ESOPHAGOGASTRODUODENOSCOPY N/A 08/15/2015   CarlGatha Mayer 2 small gastric AVMs, 1 actively bleeding.  Both treated with  APC ablation, clipping of the bleeder  . FOOT FRACTURE SURGERY     right heel repair  . FRACTURE SURGERY    . KNEE ARTHROSCOPY     left, right knee 10/2009  . MASTOIDECTOMY     x3 on right  . NASAL SEPTUM SURGERY     nasal septal repair  . PENILE PROSTHESIS  REMOVAL    . PENILE PROSTHESIS IMPLANT     x 2--got infection after 2nd and had to remove  . PILONIDAL CYST / SINUS EXCISION    . SHOULDER ARTHROSCOPY     right  . TONSILLECTOMY AND ADENOIDECTOMY      Vitals:   03/18/19 1223  Pulse: 86  SpO2: 92%    Subjective Assessment - 03/18/19 1114    Subjective  Patient claims he is doing well today, no reports of pain. Wife states that patient has not been HEP compliant.    Patient is accompained by:  Family member    Pertinent History   Patinet has been using a rollator for a year and 1/2, and a cane for 4 years before that. He has been having PT for the last 4 years. He has had several falls.    How long can you stand comfortably?  15 mins    How long can you walk comfortably?  150 feet    Patient Stated Goals  Patient  wants to be stronger to be abel to stand and do activiites.    Pain Onset  More than a month ago           OUTCOME MEASURES: TEST Outcome (12/31/18)  Outcome (02/10/19) Outcome (03/18/19) Interpretation  5 times sit<>stand 16.07sec     15.32 sec without HHA 15.16 sec without HHA >60 yo, >15 sec indicates increased risk for falls  10 meter walk test         . 77        m/s    0.95 m/s with rollator 1.01 m/s fastest speed; 0.87 m/s normal walking speed <1.0 m/s indicates increased risk for falls; limited community ambulator  Timed up and Go    12.64             sec   10.68 sec with rollator; 11.23 sec without assistive device;  10.12 sec with rollator; 10.77 sec without AD <14 sec indicates increased risk for falls  6 minute walk test      320          Feet   400 feet with rollator 580 ft with rollator; required rest at 3 min; 92% SPO2 and HR 86 bpm post-test. 1000 feet is community ambulator    STRENGTH:  Graded on a 0-5 scale Muscle Group Left Right                          Hip Flex 5/5 4+/5  Hip Abd 5/5 4/5  Hip Add 3+/5 3/5  Hip Ext 3+/5 3+/5      Knee Flex 5/5 5/5  Knee Ext 5/5 5/5  Ankle DF 4-/5 4/5  Ankle PF 2/5 2/5    Treatment:  Instructed patient in 6 minute walk test, 5 times sit to stand, 10 meter walk test, and timed up and go for goal reassessment; see above.   -Seated dorsiflexion x15 BLE with red theraband; cues to hold 3 seconds. Patient required min-moderate verbal/tactile cues for correct exercise technique including to increase hold time for better strengthening;  Reinforced HEP with instruction to increase performance between show segments for increased  adherence. Patient verbalized understanding;      Response to Treatment:   Patient's condition has the potential to improve in response to therapy. Maximum improvement is yet to be obtained. The anticipated improvement is attainable and reasonable in a generally predictable time.   Patient was able to achieve his goals with the 10MWT with AD and TUG without his AD, and improved his 6MWT distance to 580 feet, up from 400 feet on 02/10/2019. Patient BLE strength continues to improve, but has yet to reach his goal of 4+/5 grossly. Notable deficit in BLE plantarflexion, unable to lift heels from floor but able to combat manual resistance from PT.  PT discussed with patient ways to implement HEP exercises into his daily leisure activities to facilitate consistent compliance.      PT Education - 03/18/19 1100    Education Details  POC, exerise technique, balance/HEP    Person(s) Educated  Patient    Methods  Explanation;Demonstration    Comprehension  Verbalized understanding;Returned demonstration;Verbal cues required;Need further instruction       PT Short Term Goals - 03/18/19 1201      PT SHORT TERM GOAL #1   Title  Patient will be independent in home exercise program to improve strength/mobility for better functional independence with ADLs.    Time  4    Period  Weeks    Status  Achieved    Target Date  01/29/19      PT SHORT TERM GOAL #2   Title  Patient (> 89 years old) will complete five times sit to stand test in < 15 seconds indicating an increased LE strength and improved balance.    Baseline  03/18/19 Patient completed 5xSTS in 15.16 sec.    Time  4    Period  Weeks    Status  Partially Met    Target Date  01/29/19        PT Long Term Goals - 03/18/19 1204      PT LONG TERM GOAL #1   Title  Patient will increase 10 meter walk test to >1.58ms as to improve gait speed for better community ambulation and to reduce fall risk.    Baseline  03/18/19 Fastest walking speed 1.01  m/s.    Time  8    Period  Weeks    Status  Achieved      PT LONG TERM GOAL #2   Title  Patient will reduce timed up and go to <11 seconds to reduce fall risk and demonstrate improved transfer/gait ability.    Baseline  03/18/19 patient completed TUG with AD in 10.12 sec, without AD in 10.77 sec.    Time  8    Period  Weeks    Status  Achieved      PT LONG TERM GOAL #3   Title  Patient will increase BLE gross strength to 4+/5 as to improve functional strength for independent gait, increased standing tolerance and increased ADL ability.    Time  8    Period  Weeks    Status  Partially Met    Target Date  03/26/19            Plan - 03/18/19 1217    Clinical Impression Statement  Patient achieved his goals with TUG without AD and gait speed in 10MWT with AD. His 6MWT distance increased to 580 feet, up from 400 at last assessment, required  a seated rest break at 3 minutes due to fatigue and moderate dyspnea. BLE strength continues to improve but has not reached his goal of 4+/5 grossly, although he feels confident in ability to navigate equipment in cardiac rehab gym. Patient would continue to benefit from skilled PT to address the deficits outlined in this note.    Personal Factors and Comorbidities  Age    Examination-Activity Limitations  Locomotion Level;Stand;Transfers    Examination-Participation Restrictions  Driving;Community Activity    Stability/Clinical Decision Making  Stable/Uncomplicated    Rehab Potential  Good    PT Frequency  2x / week    PT Duration  8 weeks    PT Treatment/Interventions  Therapeutic exercise;Therapeutic activities;Balance training;Neuromuscular re-education;Patient/family education;Manual techniques    PT Next Visit Plan  gait in // bars, dynamic balance    Consulted and Agree with Plan of Care  Patient;Family member/caregiver       Patient will benefit from skilled therapeutic intervention in order to improve the following deficits and  impairments:  Abnormal gait, Decreased activity tolerance, Decreased endurance, Decreased strength, Impaired flexibility, Difficulty walking, Decreased mobility, Cardiopulmonary status limiting activity  Visit Diagnosis: Muscle weakness (generalized)  Difficulty in walking, not elsewhere classified     Problem List Patient Active Problem List   Diagnosis Date Noted  . Dysuria 12/31/2017  . Constipation 12/31/2017  . CAD (coronary artery disease), native coronary artery 12/28/2017  . Bilateral carotid artery stenosis 12/04/2017  . Laceration of right leg excluding thigh 10/23/2017  . Alcohol dependence in remission ([place]) 06/24/2017  . Imbalance 02/05/2017  . Mitral regurgitation 07/17/2016  . Dyslipidemia 07/14/2016  . Encounter for anticoagulation discussion and counseling 05/03/2015  . Chronic anticoagulation 03/01/2015  . 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 TIA (transient ischemic attack) 12/31/2014  . Advanced directives, counseling/discussion 04/19/2014  . Spinal stenosis, lumbar region, with neurogenic claudication 12/23/2012  . Osteoarthritis, multiple sites 10/08/2012  . Preventative health care 02/25/2012  . MI, old with prior NSTEMI x 2 12/28/2011  . Chronic diastolic HF (heart failure) ([place]) 12/28/2011    Class: Acute  . Edema 09/20/2010  . Allergic rhinitis 11/11/2008  . Mood disorder ([place]) 04/14/2008  . Essential hypertension 04/14/2008  . Chronic atrial fibrillation ([place]) 04/14/2008  . COPD GOLD II B 04/14/2008   Caira Poche A. Rosana Hoes, SPT This entire session was performed under direct supervision and direction of a licensed therapist/therapist assistant . I have personally read, edited and approve of the note as written.  Trotter,Margaret PT, DPT 03/18/2019, 7:00 PM  [place] MAIN [facility] SERVICES [address] [place], [place], [address] Phone: [phone]   Fax:  [phone]  Name: RION CATALA MRN: [id number] Date of Birth: 23-Dec-1930

## 2019-03-19 ENCOUNTER — Ambulatory Visit (INDEPENDENT_AMBULATORY_CARE_PROVIDER_SITE_OTHER): Payer: PPO | Admitting: Nurse Practitioner

## 2019-03-19 ENCOUNTER — Other Ambulatory Visit: Payer: Self-pay

## 2019-03-19 ENCOUNTER — Encounter: Payer: Self-pay | Admitting: Nurse Practitioner

## 2019-03-19 ENCOUNTER — Other Ambulatory Visit
Admission: RE | Admit: 2019-03-19 | Discharge: 2019-03-19 | Disposition: A | Discharge status: Home / Self Care | Payer: PPO | Source: Ambulatory Visit | Attending: Nurse Practitioner | Admitting: Nurse Practitioner

## 2019-03-19 VITALS — BP 140/70 | HR 72 | Ht 62.0 in | Wt 147.8 lb

## 2019-03-19 DIAGNOSIS — I340 Nonrheumatic mitral (valve) insufficiency: Secondary | ICD-10-CM

## 2019-03-19 DIAGNOSIS — I10 Essential (primary) hypertension: Secondary | ICD-10-CM

## 2019-03-19 DIAGNOSIS — I2510 Atherosclerotic heart disease of native coronary artery without angina pectoris: Secondary | ICD-10-CM | POA: Insufficient documentation

## 2019-03-19 DIAGNOSIS — I5042 Chronic combined systolic (congestive) and diastolic (congestive) heart failure: Secondary | ICD-10-CM

## 2019-03-19 DIAGNOSIS — E785 Hyperlipidemia, unspecified: Secondary | ICD-10-CM | POA: Diagnosis not present

## 2019-03-19 DIAGNOSIS — I4821 Permanent atrial fibrillation: Secondary | ICD-10-CM | POA: Diagnosis not present

## 2019-03-19 LAB — CBC
HCT: 33 % — ABNORMAL LOW (ref 39.0–52.0)
Hemoglobin: 10.5 g/dL — ABNORMAL LOW (ref 13.0–17.0)
MCH: 29.2 pg (ref 26.0–34.0)
MCHC: 31.8 g/dL (ref 30.0–36.0)
MCV: 91.7 fL (ref 80.0–100.0)
Platelets: 190 10*3/uL (ref 150–400)
RBC: 3.6 MIL/uL — ABNORMAL LOW (ref 4.22–5.81)
RDW: 16 % — ABNORMAL HIGH (ref 11.5–15.5)
WBC: 7.1 10*3/uL (ref 4.0–10.5)
nRBC: 0 % (ref 0.0–0.2)

## 2019-03-19 LAB — BASIC METABOLIC PANEL
Anion gap: 9 (ref 5–15)
BUN: 37 mg/dL — ABNORMAL HIGH (ref 8–23)
CO2: 26 mmol/L (ref 22–32)
Calcium: 9.9 mg/dL (ref 8.9–10.3)
Chloride: 106 mmol/L (ref 98–111)
Creatinine, Ser: 1.01 mg/dL (ref 0.61–1.24)
GFR calc Af Amer: >60 mL/min (ref >60)
GFR calc non Af Amer: >60 mL/min (ref >60)
Glucose, Bld: 100 mg/dL — ABNORMAL HIGH (ref 70–99)
Potassium: 3.9 mmol/L (ref 3.5–5.1)
Sodium: 141 mmol/L (ref 135–145)

## 2019-03-19 NOTE — Progress Notes (Signed)
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  Past Medical History    Past Medical History:  Diagnosis Date   Allergy    Anemia 2012, 08/2015   chronic. Acute due to large hematoma 2013. 2017: due to gastric AVM bleeding.    Anxiety    CAD (coronary artery disease)    a. 06/2010 Cath: LM nl, LAD 50p/m, LCX 73m, RCA 50p/m -->catheter dissection but good flow, 70d, RPDA 50-70;  b. 06/2010 Relook cath due to bradycardia and inf ST elev: RCA dissection @ ostium extending into coronary cusp and prox RCA, Ao dissection-->less staining than previously-->Med Rx; c. 08/2018 MV: No ischemia.   Chronic combined systolic (congestive) and diastolic (congestive) heart failure ([place])    a. 12/2014 Echo: EF 60-65%; b. 07/2016 Echo: EF 45-50%, mild LVH. Mild AS/MS. Mod-sev MR. Mild to mod TR. PASP 19mmHg; c. 08/2018 Echo: EF 55-60%, RVSP 68.65mmHg. Sev dil LA, mod dil RA. at least mod MR, mild to mod TR. Mild AS.   COPD (chronic obstructive pulmonary disease) ([place]) 2013   exertional dyspnea   Gastric angiodysplasia with hemorrhage 08/2015   Hx of colonic polyps 2001, 2011   adenomatous 2001. HP 2001, 2011.   Hyperlipidemia    Mitral regurgitation    a. 07/2016 Echo: Mod to sev MR; b. 08/2018 Echo: at least moderate MR.   Myocardial infarction ([place]) 06/2010.  12/2011.   "twice; 1 day apart; during/after cath". NQMI in setting severe anemia 2013.    Neuropathy 2014   in feet, likely due to spinal stenosis, DDD, HNP     Osteoarthritis 2002   spinal stenosis, spondylolisthesis, disc protrusion multilevel in lumbar spine   Permanent atrial fibrillation ([place]) 2012   a.  CHA2DS2VASc = 6-->eliquis;  b. 12/2015 Echo: EF 60-65%, no rwma, LVH, mild AS/MS/MR, sev dil LA, mild TR, PASP 19mmHg.   Stroke [facility]) 2016   Upper GI bleed 08/2015   EGD: 2 small gastric AVMs, 1 actively bleeding.  Both treated with APC ablation, clipping of the bleeder   Past Surgical History:  Procedure Laterality Date   APPENDECTOMY     CARDIAC CATHETERIZATION  2012   50% LAD and luminal irreg in RCA, 1/12  Post cath MI from damage   CARPAL TUNNEL RELEASE     left   CARPAL TUNNEL RELEASE  10/11/11   Dr Rush Barer   CATARACT EXTRACTION W/ INTRAOCULAR LENS IMPLANT  2/13   Dr [name]   COLONOSCOPY  2001, 02/2010   Dr Deatra Ina.    ESOPHAGOGASTRODUODENOSCOPY N/A 08/15/2015   [name] MD; 2 small gastric AVMs, 1 actively bleeding.  Both treated with APC ablation, clipping of the bleeder   FOOT FRACTURE SURGERY     right heel repair   FRACTURE SURGERY     KNEE ARTHROSCOPY     left, right knee 10/2009   MASTOIDECTOMY     x3 on right   NASAL SEPTUM SURGERY     nasal septal repair   PENILE PROSTHESIS  REMOVAL     PENILE PROSTHESIS IMPLANT  x 2--got infection after 2nd and had to remove   PILONIDAL CYST / SINUS EXCISION     SHOULDER ARTHROSCOPY     right   TONSILLECTOMY AND ADENOIDECTOMY      Allergies  Allergies  Allergen Reactions   Penicillins Itching    Has patient had a PCN reaction causing immediate rash, facial/tongue/throat swelling, SOB or lightheadedness with hypotension: Yes Has patient had a PCN reaction causing severe rash involving mucus membranes or skin necrosis: No Has patient had a PCN reaction that required hospitalization No Has patient had a PCN reaction occurring within the last 10 years: No If all of the above answers are "NO", then may proceed with Cephalosporin use.     Sulfonamide Derivatives Other (See Comments)    "it affected my kidneys"   Tape Other (See Comments)    Arms black and blue, tears skin.  Please use "paper" tape    History of Present Illness    85 y/o ? w/ the above complex PMH including CAD s/p cath in 06/2010 revealing mod multivessel dzs, complicated by dissection of the ostial RCA w/ subsequent inf ST elevation requiring relook cath.  This has been medically managed.  Other hx includes chronic combined syst/diast CHF, permanent Afib on eliquis, prior embolic stroke, HTN, HL, COPD, and at least moderate mitral regurgitation.  He was last seen in cardiology clinic in March of this year after requiring adjustment to Lasix therapy due to increasing dyspnea and lower extremity edema.  His echocardiogram in March showed normal LV function with an RVSP of 68.6 mmHg, severely dilated left atrium, at least moderate mitral regurgitation, and mild aortic stenosis.  At his last visit, he reported an episode of chest discomfort and underwent stress testing which was nonischemic.  His EF was reported as being depressed however, as noted, echo showed normal LV function.  Since his last office visit, he has been working quite a bit with rehabilitation for gait training.  He has mostly noticed improvements and has been referred to cardiopulmonary rehabilitation, which he will be starting next week.  He is looking forward to this as his gym is been closed in the setting of the pandemic.  He has gained 7 pounds since March, which he attributes to being less active and eating more.  He has noted some increase in baseline level of dyspnea on exertion, especially when walking up stairs in his home.  He has not had any chest pain and denies palpitations, PND, orthopnea, dizziness, syncope, or early satiety.  He has some degree of chronic, mild lower extremity edema which he says is not present first thing in the morning but comes on in the setting of having his feet in  a dependent position for much of the day.  He is currently taking Lasix 40 mg daily.  Home Medications    Prior to Admission medications   Medication Sig Start Date End Date Taking? Authorizing Provider  acetaminophen (TYLENOL) 650 MG CR tablet Take 1,300 mg by mouth 2 (two) times daily.   Yes [provider]  albuterol (PROVENTIL) (2.5 MG/3ML) 0.083% nebulizer solution Use 1 vial in nebulizer every 4 hours as needed for wheezing or shortness of breath 02/09/19  Yes Byrum, Rose Fillers, MD  ALPRAZolam Duanne Moron) 0.25 MG tablet Take 1 tablet (0.25 mg total) by mouth 2 (two) times daily as needed for anxiety. 12/11/17  Yes Venia Carbon, MD  apixaban (ELIQUIS) 5 MG TABS tablet Take 1 tablet (5 mg total)  by mouth 2 (two) times daily. 04/16/18  Yes Minna Merritts, MD  atorvastatin (LIPITOR) 80 MG tablet TAKE ONE TABLET BY MOUTH ONE TIME DAILY  03/02/19  Yes Gollan, Kathlene November, MD  B Complex-C (SUPER B COMPLEX PO) Take 1 tablet by mouth at bedtime.    Yes [provider]  cetirizine (ZYRTEC) 10 MG tablet Take 10 mg by mouth at bedtime.    Yes [provider]  cholecalciferol (VITAMIN D) 1000 units tablet Take 1,000 Units by mouth daily.   Yes [provider]  escitalopram (LEXAPRO) 20 MG tablet Take 20 mg by mouth at bedtime.    Yes [provider]  fenofibrate 160 MG tablet TAKE ONE TABLET BY MOUTH AT BEDTIME  06/23/18  Yes Venia Carbon, MD  furosemide (LASIX) 40 MG tablet Take 1 tablet (40 mg total) by mouth daily. 11/28/18  Yes Venia Carbon, MD  hydrALAZINE (APRESOLINE) 10 MG tablet Take 1 tablet (10 mg total) by mouth 3 (three) times daily. 01/20/18  Yes Minna Merritts, MD  ipratropium (ATROVENT) 0.03 % nasal spray PLACE 2 SPRAYS INTO BOTH NOSTRILS AT BEDTIME 09/08/18  Yes Byrum, Rose Fillers, MD  lamoTRIgine (LAMICTAL) 150 MG tablet Take 150 mg by mouth at bedtime. Reported on 08/14/2015 07/07/15  Yes [provider]  montelukast (SINGULAIR) 10 MG  tablet TAKE ONE TABLET BY MOUTH DAILY AT BEDTIME  03/02/19  Yes Byrum, Rose Fillers, MD  Multiple Vitamin (MULTIVITAMIN) tablet Take 1 tablet by mouth at bedtime.    Yes [provider]  nitroGLYCERIN (NITROSTAT) 0.4 MG SL tablet Place 1 tablet (0.4 mg total) under the tongue every 5 (five) minutes as needed for chest pain. 08/19/18 03/19/19 Yes Theora Gianotti, NP  pantoprazole (PROTONIX) 40 MG tablet TAKE 1 TABLET BY MOUTH DAILY BEFORE BREAKFAST. 10/14/18  Yes Venia Carbon, MD  Tiotropium Bromide-Olodaterol (STIOLTO RESPIMAT) 2.5-2.5 MCG/ACT AERS Inhale 2 puffs into the lungs daily. 10/16/18  Yes Collene Gobble, MD  triamcinolone cream (KENALOG) 0.1 % Apply 1 application topically 2 (two) times daily as needed (for skin).  08/10/15  Yes [provider]  verapamil (CALAN-SR) 240 MG CR tablet Take 1 tablet (240 mg total) by mouth at bedtime. 05/01/18  Yes Minna Merritts, MD    Review of Systems    Over the past 6 months, he has noted some increase in baseline level of dyspnea on exertion.  Weight is up 7 pounds though he attributes this to being less active.  He has some degree of mild lower extremity edema that progresses as the day goes on and is not present first thing in the morning.  He denies chest pain, palpitations, PND, orthopnea, dizziness, syncope, or early satiety.  All other systems reviewed and are otherwise negative except as noted above.  Physical Exam    VS:  BP 140/70 (BP Location: Left Arm, Patient Position: Sitting, Cuff Size: Normal)    Pulse 72    Ht 5\' 2"  (1.575 m)    Wt 147 lb 12 oz (67 kg)    BMI 27.02 kg/m  , BMI Body mass index is 27.02 kg/m. GEN: Well nourished, well developed, in no acute distress. HEENT: normal. Neck: Supple, no JVD, carotid bruits, or masses. Cardiac: Irregularly irregular, no murmurs, rubs, or gallops. No clubbing, cyanosis, 1+ left lower extremity edema with trace right ankle edema.  Radials/PT 2+ and equal bilaterally.    Respiratory:  Respirations regular and unlabored, clear to auscultation  bilaterally. GI: Soft, nontender, nondistended, BS + x 4. MS: no deformity or atrophy. Skin: warm and dry, no rash. Neuro:  Strength and sensation are intact. Psych: Normal affect.  Accessory Clinical Findings    ECG personally reviewed by me today -atrial fibrillation, 72- no acute changes.  Lab Results  Component Value Date   WBC 8.8 03/24/2018   HGB 11.3 (L) 03/24/2018   HCT 35.2 (L) 03/24/2018   MCV 89.6 03/24/2018   PLT 229 03/24/2018   Lab Results  Component Value Date   CREATININE 0.95 08/12/2018   BUN 26 08/12/2018   NA 141 08/12/2018   K 3.7 08/12/2018   CL 101 08/12/2018   CO2 24 08/12/2018   Lab Results  Component Value Date   ALT 23 07/01/2017   AST 37 (H) 07/01/2017   ALKPHOS 56 07/01/2017   BILITOT 0.4 07/01/2017   Lab Results  Component Value Date   CHOL 132 06/24/2017   HDL 44.80 06/24/2017   LDLCALC UNABLE TO CALCULATE IF TRIGLYCERIDE OVER 400 mg/dL 12/31/2014   LDLDIRECT 69.0 06/24/2017   TRIG (H) 06/24/2017    1031.0 Triglyceride is over 400; calculations on Lipids are invalid.   CHOLHDL 3 06/24/2017     Assessment & Plan    1.  Acute on chronic diastolic congestive heart failure: Over the past 6 months, patient is noted an increase in baseline level of dyspnea on exertion as well as 7 pound weight gain.  He does have trace to 1+ bilateral lower extremity edema today.  He has been taking Lasix 40 mg daily.  At her last visit, we had agreed that he would drop down to 20 mg daily but he says he went back up due to increasing lower extremity swelling.  His blood pressure is moderately elevated today on hydralazine and verapamil.  I will follow-up a basic metabolic panel today.  Provided that this shows stable renal function and electrolytes, I will have him double his Lasix for 3 days.   2.  Permanent atrial fibrillation: Rate controlled on verapamil therapy.  He is  anticoagulated with Eliquis.  Follow-up CBC and basic metabolic panel today.  3.  Essential hypertension: Blood pressure moderately elevated.  Following up labs and will likely increase diuresis for a few days.  4.  Coronary artery disease: Medically managed dissection of the ostial right coronary artery dating back to 2012.  He has not been having any chest pain.  Recent nonischemic stress test in March.  Continue statin therapy.  No aspirin in the setting of chronic Eliquis.  5.  Hyperlipidemia: Most recent LDL was 69 in January 2019 and he remains on statin therapy.  6.  Moderate mitral regurgitation: Noted on most recent echo in March.  Question role in dyspnea.  I do not appreciate an MR murmur.  Follow-up labs with plan for short course diuresis.  At this point, would plan to follow-up echo next year and if there is any worsening of much regurgitation, he would require TEE and possible referral for a MitraClip.  7.  Pulmonary hypertension: RVSP of 68 on echo in March.  Follow-up labs today.  Continue Lasix.  8.  Disposition: Follow-up lab work today.  Follow-up in clinic in 2 months or sooner if necessary.   [name], NP 03/19/2019, 3:41 PM

## 2019-03-19 NOTE — Patient Instructions (Signed)
Medication Instructions:  Your physician recommends that you continue on your current medications as directed. Please refer to the Current Medication list given to you today.  If you need a refill on your cardiac medications before your next appointment, please call your pharmacy.   Lab work: Your physician recommends that you return for lab work today at the medical mall. (CBC, BMET)  No appt is needed. Hours are M-F 7AM- 6 PM.  If you have labs (blood work) drawn today and your tests are completely normal, you will receive your results only by: Marland Kitchen MyChart Message (if you have MyChart) OR . A paper copy in the mail If you have any lab test that is abnormal or we need to change your treatment, we will call you to review the results.  Testing/Procedures: None ordered   Follow-Up: At [facility], you and your health needs are our priority.  As part of our continuing mission to provide you with exceptional heart care, we have created designated Provider Care Teams.  These Care Teams include your primary Cardiologist (physician) and Advanced Practice Providers (APPs -  Physician Assistants and Nurse Practitioners) who all work together to provide you with the care you need, when you need it. You will need a follow up appointment in 2-3 months.  You may see Ida Rogue, MD or [name], NP.

## 2019-03-20 ENCOUNTER — Telehealth: Payer: Self-pay

## 2019-03-20 NOTE — Telephone Encounter (Signed)
-----   Message from Theora Gianotti, NP sent at 03/19/2019  5:29 PM EDT ----- Renal fxn/lytes stable.  Mild, chronic anemia - also stable.  In light of increased weight, shortness of breath, and mild edema, please have him increase lasix to 40mg  BID x 5 days and then drop back down to 40mg  daily.

## 2019-03-20 NOTE — Telephone Encounter (Signed)
DPR on file. Patient's wife [name] made aware of lab results and Ignacia Bayley, NP recommendations with verbalized understanding.

## 2019-03-24 ENCOUNTER — Ambulatory Visit: Payer: PPO | Admitting: Physical Therapy

## 2019-03-24 ENCOUNTER — Encounter: Payer: Self-pay | Admitting: Physical Therapy

## 2019-03-24 ENCOUNTER — Other Ambulatory Visit: Payer: Self-pay

## 2019-03-24 VITALS — HR 103

## 2019-03-24 DIAGNOSIS — M6281 Muscle weakness (generalized): Secondary | ICD-10-CM

## 2019-03-24 DIAGNOSIS — R262 Difficulty in walking, not elsewhere classified: Secondary | ICD-10-CM

## 2019-03-24 NOTE — Therapy (Signed)
[place] MAIN [facility] SERVICES [address], [place], [address] Phone: [phone]   Fax:  [phone]  Physical Therapy Treatment  Patient Details  Name: Eduardo [name] MRN: [id number] Date of Birth: 26-Sep-1930 Referring Provider (PT): [name]   Encounter Date: 03/24/2019  PT End of Session - 03/24/19 0927    Visit Number  21    Number of Visits  25    Date for PT Re-Evaluation  03/26/19    PT Start Time  0930    PT Stop Time  1016    PT Time Calculation (min)  46 min    Equipment Utilized During Treatment  Gait belt    Activity Tolerance  Patient tolerated treatment well    Behavior During Therapy  [facility] for tasks assessed/performed       Past Medical History:  Diagnosis Date  . Allergy   . Anemia 2012, 08/2015   chronic. Acute due to large hematoma 2013. 2017: due to gastric AVM bleeding.   . Anxiety   . CAD (coronary artery disease)    a. 06/2010 Cath: LM nl, LAD 50p/m, LCX 54m RCA 50p/m -->catheter dissection but good flow, 70d, RPDA 50-70;  b. 06/2010 Relook cath due to bradycardia and inf ST elev: RCA dissection @ ostium extending into coronary cusp and prox RCA, Ao dissection-->less staining than previously-->Med Rx; c. 08/2018 MV: No ischemia.  . Chronic combined systolic (congestive) and diastolic (congestive) heart failure ([place]    a. 12/2014 Echo: EF 60-65%; b. 07/2016 Echo: EF 45-50%, mild LVH. Mild AS/MS. Mod-sev MR. Mild to mod TR. PASP 514mg; c. 08/2018 Echo: EF 55-60%, RVSP 68.71m19m. Sev dil LA, mod dil RA. at least mod MR, mild to mod TR. Mild AS.  . CMarland KitchenPD (chronic obstructive pulmonary disease) ([place]CC[place] Clay Farms013   exertional dyspnea  . Gastric angiodysplasia with hemorrhage 08/2015  . Hx of colonic polyps 2001, 2011   adenomatous 2001. HP 2001, 2011.  . HMarland Kitchenperlipidemia   . Mitral regurgitation    a. 07/2016 Echo: Mod to sev MR; b. 08/2018 Echo: at least moderate MR.  . Myocardial infarction ([place]/2012.  12/2011.    "twice; 1 day apart; during/after cath". NQMI in setting severe anemia 2013.   . NMarland Kitchenuropathy 2014   in feet, likely due to spinal stenosis, DDD, HNP  . Osteoarthritis 2002   spinal stenosis, spondylolisthesis, disc protrusion multilevel in lumbar spine  . Permanent atrial fibrillation (HCC[place]012   a.  CHA2DS2VASc = 6-->eliquis;  b. 12/2015 Echo: EF 60-65%, no rwma, LVH, mild AS/MS/MR, sev dil LA, mild TR, PASP 83m60m  . Stroke (HCC)[place]16  . Upper GI bleed 08/2015   EGD: 2 small gastric AVMs, 1 actively bleeding.  Both treated with APC ablation, clipping of the bleeder    Past Surgical History:  Procedure Laterality Date  . APPENDECTOMY    . CARDIAC CATHETERIZATION  2012   50% LAD and luminal irreg in RCA, 1/12  Post cath MI from damage  . CARPAL TUNNEL RELEASE     left  . CARPAL TUNNEL RELEASE  10/11/11   Dr WhitRush BarerCATARACT EXTRACTION W/ INTRAOCULAR LENS IMPLANT  2/13   Dr EpesMontey HoraCOLONOSCOPY  2001, 02/2010   Dr KaplDeatra Ina. ESOPHAGOGASTRODUODENOSCOPY N/A 08/15/2015   CarlGatha Mayer 2 small gastric AVMs, 1 actively bleeding.  Both treated with APC ablation, clipping of the bleeder  . FOOT FRACTURE SURGERY  right heel repair  . FRACTURE SURGERY    . KNEE ARTHROSCOPY     left, right knee 10/2009  . MASTOIDECTOMY     x3 on right  . NASAL SEPTUM SURGERY     nasal septal repair  . PENILE PROSTHESIS  REMOVAL    . PENILE PROSTHESIS IMPLANT     x 2--got infection after 2nd and had to remove  . PILONIDAL CYST / SINUS EXCISION    . SHOULDER ARTHROSCOPY     right  . TONSILLECTOMY AND ADENOIDECTOMY      Vitals:   03/24/19 0941  Pulse: (!) 103  SpO2: 90%    Subjective Assessment - 03/24/19 0932    Subjective  Patient states he is doing well today, reports feeling"off" with his balance however. No new falls or new concerns.    Patient is accompained by:  Family member    Pertinent History  Patinet has been using a rollator for a year and 1/2, and a cane  for 4 years before that. He has been having PT for the last 4 years. He has had several falls.    How long can you stand comfortably?  15 mins    How long can you walk comfortably?  150 feet    Patient Stated Goals  Patient  wants to be stronger to be abel to stand and do activiites.    Currently in Pain?  No/denies    Pain Score  0-No pain    Pain Onset  More than a month ago    Multiple Pain Sites  No      Neuro Re-ed: Patient requires CGA during all standing interventions due to limited stability and patient fear of LOB. Cueing for reduction of UE support required as well as postural alignment for optimal stability within COM   TREATMENT:  Warm up with gait in hallway x320 feet without rollator, required seated break halfway, CGA; intermittent calls to stop and touch playing card/wall on either right or left side; He did require seated rest break after walking; SPO2 90% HR 103 bpm after ambulation    Patient instructed in advanced balance exercise   In 10MWT area: Instructed patient in obstacle course x2 sets:    Weaving around 3 cones Side-step to SAEBO Moving SAEBO balls up/down 3 rungs each UE  Side-step over cane  Forward step over orange hurdle  Seated rest break STS from regular chair unsupported x5 reps, hands on knees Tossing basketball x3 attempt   He does fatigue quickly requiring rest break prior to reaching chair; Require min A for safety with intermittent loss of balance often buckling knees with poor hip/ankle strategies;    Standing on firm surface feet together 2x30 sec hold; frequent stagger step to regain balance, improved with min VC to focus eyes on object straight ahead.   Seated ankle DF x15 BLE, 3 sec hold with red TB  Long sitting ankle PF x15 BLE, 3 sec hold with red TB   Patient demonstrates excellent motivation throughout today's session. Patient was able to perform advanced balance exercise with less frequent LOB this date compared to previous  sessions, required a seated rest break halfway through obstacle course due to quick onset of fatigue and knee buckling. Patient continues to be challenged by unstable static balance, requiring stepping strategies to maintain standing position due to poor ankle and hip strategies.      PT Education - 03/24/19 0927    Education Details  exercise technique, balance/HEP  Person(s) Educated  Patient    Methods  Explanation;Demonstration    Comprehension  Verbalized understanding;Returned demonstration;Verbal cues required;Need further instruction       PT Short Term Goals - 03/18/19 1201      PT SHORT TERM GOAL #1   Title  Patient will be independent in home exercise program to improve strength/mobility for better functional independence with ADLs.    Time  4    Period  Weeks    Status  Achieved    Target Date  01/29/19      PT SHORT TERM GOAL #2   Title  Patient (> 27 years old) will complete five times sit to stand test in < 15 seconds indicating an increased LE strength and improved balance.    Baseline  03/18/19 Patient completed 5xSTS in 15.16 sec.    Time  4    Period  Weeks    Status  Partially Met    Target Date  01/29/19        PT Long Term Goals - 03/18/19 1204      PT LONG TERM GOAL #1   Title  Patient will increase 10 meter walk test to >1.81ms as to improve gait speed for better community ambulation and to reduce fall risk.    Baseline  03/18/19 Fastest walking speed 1.01 m/s.    Time  8    Period  Weeks    Status  Achieved      PT LONG TERM GOAL #2   Title  Patient will reduce timed up and go to <11 seconds to reduce fall risk and demonstrate improved transfer/gait ability.    Baseline  03/18/19 patient completed TUG with AD in 10.12 sec, without AD in 10.77 sec.    Time  8    Period  Weeks    Status  Achieved      PT LONG TERM GOAL #3   Title  Patient will increase BLE gross strength to 4+/5 as to improve functional strength for independent gait, increased  standing tolerance and increased ADL ability.    Time  8    Period  Weeks    Status  Partially Met    Target Date  03/26/19            Plan - 03/24/19 0945    Clinical Impression Statement  Patient presented with excellent motivation for today's session. He continues to exhibit moderate to severe dyspnea following extended periods of walking and standing activities, which improves with frequent seated rest breaks. Patient demonstrated improved ability to navigate obstacle course with less frequent LOB, which included static balance reaching outside BOS, side stepping, stepping over obstacles, and sit to stands, although his knees begin to buckle when fatigue sets in, impairing his sense of balance. Patient would continue to benefit from skilled PT to address the deficits outlined in this note.    Personal Factors and Comorbidities  Age    Examination-Activity Limitations  Locomotion Level;Stand;Transfers    Examination-Participation Restrictions  Driving;Community Activity    Stability/Clinical Decision Making  Stable/Uncomplicated    Rehab Potential  Good    PT Frequency  2x / week    PT Duration  8 weeks    PT Treatment/Interventions  Therapeutic exercise;Therapeutic activities;Balance training;Neuromuscular re-education;Patient/family education;Manual techniques    PT Next Visit Plan  gait in // bars, dynamic balance    Consulted and Agree with Plan of Care  Patient;Family member/caregiver       Patient will benefit  from skilled therapeutic intervention in order to improve the following deficits and impairments:  Abnormal gait, Decreased activity tolerance, Decreased endurance, Decreased strength, Impaired flexibility, Difficulty walking, Decreased mobility, Cardiopulmonary status limiting activity  Visit Diagnosis: Muscle weakness (generalized)  Difficulty in walking, not elsewhere classified     Problem List Patient Active Problem List   Diagnosis Date Noted  . Dysuria  12/31/2017  . Constipation 12/31/2017  . CAD (coronary artery disease), native coronary artery 12/28/2017  . Bilateral carotid artery stenosis 12/04/2017  . Laceration of right leg excluding thigh 10/23/2017  . Alcohol dependence in remission ([place]) 06/24/2017  . Imbalance 02/05/2017  . Mitral regurgitation 07/17/2016  . Dyslipidemia 07/14/2016  . Encounter for anticoagulation discussion and counseling 05/03/2015  . Chronic anticoagulation 03/01/2015  . 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 TIA (transient ischemic attack) 12/31/2014  . Advanced directives, counseling/discussion 04/19/2014  . Spinal stenosis, lumbar region, with neurogenic claudication 12/23/2012  . Osteoarthritis, multiple sites 10/08/2012  . Preventative health care 02/25/2012  . MI, old with prior NSTEMI x 2 12/28/2011  . Chronic diastolic HF (heart failure) ([place]) 12/28/2011    Class: Acute  . Edema 09/20/2010  . Allergic rhinitis 11/11/2008  . Mood disorder ([place]) 04/14/2008  . Essential hypertension 04/14/2008  . Chronic atrial fibrillation ([place]) 04/14/2008  . COPD GOLD II B 04/14/2008   Eduardo [name] A. Rosana Hoes, SPT This entire session was performed under direct supervision and direction of a licensed therapist/therapist assistant . I have personally read, edited and approve of the note as written.  Trotter,Margaret PT, DPT 03/24/2019, 3:45 PM  [place] MAIN [facility] SERVICES [address] [place], [place], [address] Phone: [phone]   Fax:  [phone]  Name: Eduardo [name] MRN: [id number] Date of Birth: 01/21/1931

## 2019-03-25 ENCOUNTER — Ambulatory Visit: Payer: PPO

## 2019-03-25 ENCOUNTER — Other Ambulatory Visit: Payer: Self-pay

## 2019-03-25 DIAGNOSIS — F39 Unspecified mood [affective] disorder: Secondary | ICD-10-CM | POA: Diagnosis not present

## 2019-03-25 DIAGNOSIS — M6281 Muscle weakness (generalized): Secondary | ICD-10-CM | POA: Diagnosis not present

## 2019-03-25 DIAGNOSIS — R262 Difficulty in walking, not elsewhere classified: Secondary | ICD-10-CM

## 2019-03-25 DIAGNOSIS — F411 Generalized anxiety disorder: Secondary | ICD-10-CM | POA: Diagnosis not present

## 2019-03-25 NOTE — Therapy (Addendum)
[place] MAIN [facility] SERVICES [address] [place], [place], [address] Phone: [phone]   Fax:  [phone]  Physical Therapy Treatment  Patient Details  Name: Eduardo [name] MRN: [id number] Date of Birth: 1930-07-20 Referring Provider (PT): [name]   Encounter Date: 03/25/2019  PT End of Session - 03/25/19 1435    Visit Number  22    Number of Visits  25    Date for PT Re-Evaluation  03/26/19    PT Start Time  1324    PT Stop Time  1511    PT Time Calculation (min)  40 min    Equipment Utilized During Treatment  Gait belt    Activity Tolerance  Patient tolerated treatment well;No increased pain    Behavior During Therapy  WFL for tasks assessed/performed       Past Medical History:  Diagnosis Date  . Allergy   . Anemia 2012, 08/2015   chronic. Acute due to large hematoma 2013. 2017: due to gastric AVM bleeding.   . Anxiety   . CAD (coronary artery disease)    a. 06/2010 Cath: LM nl, LAD 50p/m, LCX 11m RCA 50p/m -->catheter dissection but good flow, 70d, RPDA 50-70;  b. 06/2010 Relook cath due to bradycardia and inf ST elev: RCA dissection @ ostium extending into coronary cusp and prox RCA, Ao dissection-->less staining than previously-->Med Rx; c. 08/2018 MV: No ischemia.  . Chronic combined systolic (congestive) and diastolic (congestive) heart failure ([place]    a. 12/2014 Echo: EF 60-65%; b. 07/2016 Echo: EF 45-50%, mild LVH. Mild AS/MS. Mod-sev MR. Mild to mod TR. PASP 567mg; c. 08/2018 Echo: EF 55-60%, RVSP 68.55m25m. Sev dil LA, mod dil RA. at least mod MR, mild to mod TR. Mild AS.  . CMarland KitchenPD (chronic obstructive pulmonary disease) (HCCIndian Lake013   exertional dyspnea  . Gastric angiodysplasia with hemorrhage 08/2015  . Hx of colonic polyps 2001, 2011   adenomatous 2001. HP 2001, 2011.  . HMarland Kitchenperlipidemia   . Mitral regurgitation    a. 07/2016 Echo: Mod to sev MR; b. 08/2018 Echo: at least moderate MR.  . Myocardial infarction  ([place]CC[place]/2012.  12/2011.   "twice; 1 day apart; during/after cath". NQMI in setting severe anemia 2013.   . NMarland Kitchenuropathy 2014   in feet, likely due to spinal stenosis, DDD, HNP  . Osteoarthritis 2002   spinal stenosis, spondylolisthesis, disc protrusion multilevel in lumbar spine  . Permanent atrial fibrillation (HCC[place]012   a.  CHA2DS2VASc = 6-->eliquis;  b. 12/2015 Echo: EF 60-65%, no rwma, LVH, mild AS/MS/MR, sev dil LA, mild TR, PASP 40m51m  . Stroke (HCC)[place]16  . Upper GI bleed 08/2015   EGD: 2 small gastric AVMs, 1 actively bleeding.  Both treated with APC ablation, clipping of the bleeder    Past Surgical History:  Procedure Laterality Date  . APPENDECTOMY    . CARDIAC CATHETERIZATION  2012   50% LAD and luminal irreg in RCA, 1/12  Post cath MI from damage  . CARPAL TUNNEL RELEASE     left  . CARPAL TUNNEL RELEASE  10/11/11   Dr WhitRush BarerCATARACT EXTRACTION W/ INTRAOCULAR LENS IMPLANT  2/13   Dr EpesMontey HoraCOLONOSCOPY  2001, 02/2010   Dr KaplDeatra Ina. ESOPHAGOGASTRODUODENOSCOPY N/A 08/15/2015   CarlGatha Mayer 2 small gastric AVMs, 1 actively bleeding.  Both treated with APC ablation, clipping of the bleeder  . FOOT FRACTURE SURGERY  right heel repair  . FRACTURE SURGERY    . KNEE ARTHROSCOPY     left, right knee 10/2009  . MASTOIDECTOMY     x3 on right  . NASAL SEPTUM SURGERY     nasal septal repair  . PENILE PROSTHESIS  REMOVAL    . PENILE PROSTHESIS IMPLANT     x 2--got infection after 2nd and had to remove  . PILONIDAL CYST / SINUS EXCISION    . SHOULDER ARTHROSCOPY     right  . TONSILLECTOMY AND ADENOIDECTOMY      There were no vitals filed for this visit.  Subjective Assessment - 03/25/19 1434    Subjective  Pt doing well today. No pain or updates. he is concerned about a cardiac rehab pt he saw without a mask the other day.    Pertinent History  Patinet has been using a rollator for a year and 1/2, and a cane for 4 years before that. He has  been having PT for the last 4 years. He has had several falls.    Currently in Pain?  No/denies      INTERVENTION THIS DATE:  -interval training in hallway 5x126f c rollator loaded with 40lbs  *on interval ~60seconds, rest interval ~2 minutes  (7565fAMB total); SpO2 >95% throughout, HR low 80s.  Pt limited by SOB  Forward/retro hurdle step-over 1x8, lateral hurdle step-over 1x8 B Airex foam straight waight chest press 3x10 3lb; LOB 30% of time with modA *in lieu of neuropathic loss of ankle rocker/ankle strtegy pt maintains a flexed knee strategy to control sagittal plane weight shift; improved performance 2nd and third sets with better trunk proprioception and less severe knee flexion    PT Short Term Goals - 03/18/19 1201      PT SHORT TERM GOAL #1   Title  Patient will be independent in home exercise program to improve strength/mobility for better functional independence with ADLs.    Time  4    Period  Weeks    Status  Achieved    Target Date  01/29/19      PT SHORT TERM GOAL #2   Title  Patient (> 6075ears old) will complete five times sit to stand test in < 15 seconds indicating an increased LE strength and improved balance.    Baseline  03/18/19 Patient completed 5xSTS in 15.16 sec.    Time  4    Period  Weeks    Status  Partially Met    Target Date  01/29/19        PT Long Term Goals - 03/18/19 1204      PT LONG TERM GOAL #1   Title  Patient will increase 10 meter walk test to >1.58m76mas to improve gait speed for better community ambulation and to reduce fall risk.    Baseline  03/18/19 Fastest walking speed 1.01 m/s.    Time  8    Period  Weeks    Status  Achieved      PT LONG TERM GOAL #2   Title  Patient will reduce timed up and go to <11 seconds to reduce fall risk and demonstrate improved transfer/gait ability.    Baseline  03/18/19 patient completed TUG with AD in 10.12 sec, without AD in 10.77 sec.    Time  8    Period  Weeks    Status  Achieved       PT LONG TERM GOAL #3   Title  Patient  will increase BLE gross strength to 4+/5 as to improve functional strength for independent gait, increased standing tolerance and increased ADL ability.    Time  8    Period  Weeks    Status  Partially Met    Target Date  03/26/19            Plan - 03/25/19 1437    Clinical Impression Statement  Conitnued with POC as previously established. Began session with interval training AMB with rollator walker. Pt also given ample opportunity to work on righting strategies in the sagittal plane, mos tof which is improved with proprioceptive awareness. Pt able to complete entire session as planned with rest breaks provided as needed. Pt maintains high level of focus and motivation. Extensiuve verbal, visual, and tactile cues are provided for most accurate form possible. Author provides minA intermittently for full ROM when needed. Pt has met all goals or made significant progress toward them and is ready for DC at this time. Pt will be commencing cardiac rehab next week and is agreeable to DC from PT services at this time.    Rehab Potential  Good    PT Frequency  2x / week    PT Duration  8 weeks    PT Treatment/Interventions  Therapeutic exercise;Therapeutic activities;Balance training;Neuromuscular re-education;Patient/family education;Manual techniques    PT Next Visit Plan  Pt now DC from PT services.    Consulted and Agree with Plan of Care  Patient;Family member/caregiver       Patient will benefit from skilled therapeutic intervention in order to improve the following deficits and impairments:  Abnormal gait, Decreased activity tolerance, Decreased endurance, Decreased strength, Impaired flexibility, Difficulty walking, Decreased mobility, Cardiopulmonary status limiting activity  Visit Diagnosis: Muscle weakness (generalized)  Difficulty in walking, not elsewhere classified     Problem List Patient Active Problem List   Diagnosis Date Noted   . Dysuria 12/31/2017  . Constipation 12/31/2017  . CAD (coronary artery disease), native coronary artery 12/28/2017  . Bilateral carotid artery stenosis 12/04/2017  . Laceration of right leg excluding thigh 10/23/2017  . Alcohol dependence in remission ([place]) 06/24/2017  . Imbalance 02/05/2017  . Mitral regurgitation 07/17/2016  . Dyslipidemia 07/14/2016  . Encounter for anticoagulation discussion and counseling 05/03/2015  . Chronic anticoagulation 03/01/2015  . 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 TIA (transient ischemic attack) 12/31/2014  . Advanced directives, counseling/discussion 04/19/2014  . Spinal stenosis, lumbar region, with neurogenic claudication 12/23/2012  . Osteoarthritis, multiple sites 10/08/2012  . Preventative health care 02/25/2012  . MI, old with prior NSTEMI x 2 12/28/2011  . Chronic diastolic HF (heart failure) ([place]) 12/28/2011    Class: Acute  . Edema 09/20/2010  . Allergic rhinitis 11/11/2008  . Mood disorder ([place]) 04/14/2008  . Essential hypertension 04/14/2008  . Chronic atrial fibrillation ([place]) 04/14/2008  . COPD GOLD II B 04/14/2008   2:54 PM, 03/25/19 Etta Grandchild, PT, DPT Physical Therapist - [facility]  Outpatient Physical Therapy- [place] [phone]     Etta Grandchild 03/25/2019, 2:40 PM  [place] MAIN [facility] SERVICES [address] [place], [place], [address] Phone: [phone]   Fax:  [phone]  Name: Eduardo [name] MRN: [id number] Date of Birth: [birth year]

## 2019-03-26 ENCOUNTER — Encounter: Payer: PPO | Attending: Internal Medicine

## 2019-03-26 ENCOUNTER — Other Ambulatory Visit: Payer: Self-pay

## 2019-03-26 DIAGNOSIS — I5030 Unspecified diastolic (congestive) heart failure: Secondary | ICD-10-CM | POA: Insufficient documentation

## 2019-03-26 DIAGNOSIS — I5032 Chronic diastolic (congestive) heart failure: Secondary | ICD-10-CM

## 2019-03-26 NOTE — Progress Notes (Signed)
Virtual Orientation complete. Visit diagnosis can be found in CH[facility]. RD and EP evaluation date on 03/31/2019 0930.

## 2019-03-27 ENCOUNTER — Other Ambulatory Visit: Payer: Self-pay | Admitting: Cardiovascular Disease

## 2019-03-27 ENCOUNTER — Other Ambulatory Visit: Payer: Self-pay | Admitting: Internal Medicine

## 2019-03-31 ENCOUNTER — Other Ambulatory Visit: Payer: Self-pay

## 2019-03-31 VITALS — Ht 62.0 in | Wt 142.1 lb

## 2019-03-31 DIAGNOSIS — I5032 Chronic diastolic (congestive) heart failure: Secondary | ICD-10-CM

## 2019-03-31 DIAGNOSIS — I5030 Unspecified diastolic (congestive) heart failure: Secondary | ICD-10-CM | POA: Diagnosis not present

## 2019-03-31 NOTE — Patient Instructions (Signed)
Patient Instructions  Patient Details  Name: Eduardo [name] MRN: [id number] Date of Birth: 08/04/1930 Referring Provider:  Venia Carbon, MD  Below are your personal goals for exercise, nutrition, and risk factors. Our goal is to help you stay on track towards obtaining and maintaining these goals. We will be discussing your progress on these goals with you throughout the program.  Initial Exercise Prescription: Initial Exercise Prescription - 03/31/19 1300      Date of Initial Exercise RX and Referring Provider   Date  03/31/19    Referring Provider  [place]      Treadmill   MPH  0.5    Grade  0    Minutes  2    METs  0.5      Recumbant Bike   Level  1    RPM  50    Watts  15    Minutes  10    METs  1      NuStep   Level  1    SPM  80    Minutes  15    METs  1.5      REL-XR   Level  1    Watts  20    Speed  30    Minutes  10    METs  1.5      T5 Nustep   Level  1    SPM  50    Minutes  5    METs  1      Biostep-RELP   Level  1    SPM  50    Minutes  5    METs  1      Prescription Details   Duration  Progress to 30 minutes of continuous aerobic without signs/symptoms of physical distress      Intensity   Ratings of Perceived Exertion  11-15    Perceived Dyspnea  0-4      Progression   Progression  Continue progressive overload as per policy without signs/symptoms or physical distress.      Resistance Training   Training Prescription  Yes    Weight  3    Reps  10-15       Exercise Goals: Frequency: Be able to perform aerobic exercise two to three times per week in program working toward 2-5 days per week of home exercise.  Intensity: Work with a perceived exertion of 11 (fairly light) - 15 (hard) while following your exercise prescription.  We will make changes to your prescription with you as you progress through the program.   Duration: Be able to do 30 to 45 minutes of continuous aerobic exercise in addition to a 5 minute warm-up  and a 5 minute cool-down routine.   Nutrition Goals: Your personal nutrition goals will be established when you do your nutrition analysis with the dietician.  The following are general nutrition guidelines to follow: Cholesterol < 200mg /day Sodium < 1500mg /day Fiber: Men over 50 yrs - 30 grams per day  Personal Goals: Personal Goals and Risk Factors at Admission - 03/26/19 1642      Core Components/Risk Factors/Patient Goals on Admission    Weight Management  Weight Loss;Yes    Intervention  Weight Management: Develop a combined nutrition and exercise program designed to reach desired caloric intake, while maintaining appropriate intake of nutrient and fiber, sodium and fats, and appropriate energy expenditure required for the weight goal.;Weight Management: Provide education and appropriate resources to help participant work  on and attain dietary goals.    Expected Outcomes  Short Term: Continue to assess and modify interventions until short term weight is achieved;Long Term: Adherence to nutrition and physical activity/exercise program aimed toward attainment of established weight goal;Weight Loss: Understanding of general recommendations for a balanced deficit meal plan, which promotes 1-2 lb weight loss per week and includes a negative energy balance of [phone] kcal/d;Understanding of distribution of calorie intake throughout the day with the consumption of 4-5 meals/snacks    Improve shortness of breath with ADL's  Yes    Intervention  Provide education, individualized exercise plan and daily activity instruction to help decrease symptoms of SOB with activities of daily living.    Expected Outcomes  Short Term: Improve cardiorespiratory fitness to achieve a reduction of symptoms when performing ADLs;Long Term: Be able to perform more ADLs without symptoms or delay the onset of symptoms    Heart Failure  Yes    Intervention  Provide a combined exercise and nutrition program that is  supplemented with education, support and counseling about heart failure. Directed toward relieving symptoms such as shortness of breath, decreased exercise tolerance, and extremity edema.    Expected Outcomes  Improve functional capacity of life;Short term: Attendance in program 2-3 days a week with increased exercise capacity. Reported lower sodium intake. Reported increased fruit and vegetable intake. Reports medication compliance.;Short term: Daily weights obtained and reported for increase. Utilizing diuretic protocols set by physician.;Long term: Adoption of self-care skills and reduction of barriers for early signs and symptoms recognition and intervention leading to self-care maintenance.    Hypertension  Yes    Intervention  Provide education on lifestyle modifcations including regular physical activity/exercise, weight management, moderate sodium restriction and increased consumption of fresh fruit, vegetables, and low fat dairy, alcohol moderation, and smoking cessation.;Monitor prescription use compliance.    Expected Outcomes  Short Term: Continued assessment and intervention until BP is < 140/16mm HG in hypertensive participants. < 130/18mm HG in hypertensive participants with diabetes, heart failure or chronic kidney disease.;Long Term: Maintenance of blood pressure at goal levels.    Lipids  Yes    Intervention  Provide education and support for participant on nutrition & aerobic/resistive exercise along with prescribed medications to achieve LDL 70mg , HDL >40mg .    Expected Outcomes  Short Term: Participant states understanding of desired cholesterol values and is compliant with medications prescribed. Participant is following exercise prescription and nutrition guidelines.;Long Term: Cholesterol controlled with medications as prescribed, with individualized exercise RX and with personalized nutrition plan. Value goals: LDL < 70mg , HDL > 40 mg.       Tobacco Use Initial  Evaluation: Social History   Tobacco Use  Smoking Status Former Smoker  . Packs/day: 2.50  . Years: 30.00  . Pack years: 75.00  . Types: Cigarettes  . Quit date: 06/11/1978  . Years since quitting: 40.8  Smokeless Tobacco Never Used    Exercise Goals and Review: Exercise Goals    Row Name 03/26/19 1639 03/31/19 1400           Exercise Goals   Increase Physical Activity  Yes  Yes      Intervention  Provide advice, education, support and counseling about physical activity/exercise needs.;Develop an individualized exercise prescription for aerobic and resistive training based on initial evaluation findings, risk stratification, comorbidities and participant's personal goals.  Provide advice, education, support and counseling about physical activity/exercise needs.;Develop an individualized exercise prescription for aerobic and resistive training based on initial evaluation findings,  risk stratification, comorbidities and participant's personal goals.      Expected Outcomes  Short Term: Attend rehab on a regular basis to increase amount of physical activity.;Long Term: Add in home exercise to make exercise part of routine and to increase amount of physical activity.;Long Term: Exercising regularly at least 3-5 days a week.  Short Term: Attend rehab on a regular basis to increase amount of physical activity.;Long Term: Add in home exercise to make exercise part of routine and to increase amount of physical activity.;Long Term: Exercising regularly at least 3-5 days a week.      Increase Strength and Stamina  Yes  -      Intervention  Provide advice, education, support and counseling about physical activity/exercise needs.;Develop an individualized exercise prescription for aerobic and resistive training based on initial evaluation findings, risk stratification, comorbidities and participant's personal goals.  Provide advice, education, support and counseling about physical activity/exercise  needs.;Develop an individualized exercise prescription for aerobic and resistive training based on initial evaluation findings, risk stratification, comorbidities and participant's personal goals.      Expected Outcomes  Short Term: Increase workloads from initial exercise prescription for resistance, speed, and METs.;Short Term: Perform resistance training exercises routinely during rehab and add in resistance training at home;Long Term: Improve cardiorespiratory fitness, muscular endurance and strength as measured by increased METs and functional capacity (6MWT)  Short Term: Increase workloads from initial exercise prescription for resistance, speed, and METs.;Short Term: Perform resistance training exercises routinely during rehab and add in resistance training at home;Long Term: Improve cardiorespiratory fitness, muscular endurance and strength as measured by increased METs and functional capacity (6MWT)      Able to understand and use rate of perceived exertion (RPE) scale  Yes  Yes      Intervention  Provide education and explanation on how to use RPE scale  Provide education and explanation on how to use RPE scale      Expected Outcomes  Short Term: Able to use RPE daily in rehab to express subjective intensity level;Long Term:  Able to use RPE to guide intensity level when exercising independently  Short Term: Able to use RPE daily in rehab to express subjective intensity level;Long Term:  Able to use RPE to guide intensity level when exercising independently      Able to understand and use Dyspnea scale  Yes  Yes      Intervention  Provide education and explanation on how to use Dyspnea scale  Provide education and explanation on how to use Dyspnea scale      Expected Outcomes  Short Term: Able to use Dyspnea scale daily in rehab to express subjective sense of shortness of breath during exertion;Long Term: Able to use Dyspnea scale to guide intensity level when exercising independently  Short Term:  Able to use Dyspnea scale daily in rehab to express subjective sense of shortness of breath during exertion;Long Term: Able to use Dyspnea scale to guide intensity level when exercising independently      Knowledge and understanding of Target Heart Rate Range (THRR)  Yes  Yes      Intervention  Provide education and explanation of THRR including how the numbers were predicted and where they are located for reference  Provide education and explanation of THRR including how the numbers were predicted and where they are located for reference      Expected Outcomes  Short Term: Able to state/look up THRR;Short Term: Able to use daily as guideline for intensity  in rehab;Long Term: Able to use THRR to govern intensity when exercising independently  Short Term: Able to state/look up THRR;Short Term: Able to use daily as guideline for intensity in rehab;Long Term: Able to use THRR to govern intensity when exercising independently      Able to check pulse independently  Yes  Yes      Intervention  Provide education and demonstration on how to check pulse in carotid and radial arteries.;Review the importance of being able to check your own pulse for safety during independent exercise  Provide education and demonstration on how to check pulse in carotid and radial arteries.;Review the importance of being able to check your own pulse for safety during independent exercise      Expected Outcomes  Short Term: Able to explain why pulse checking is important during independent exercise;Long Term: Able to check pulse independently and accurately  Short Term: Able to explain why pulse checking is important during independent exercise;Long Term: Able to check pulse independently and accurately      Understanding of Exercise Prescription  Yes  Yes      Intervention  Provide education, explanation, and written materials on patient's individual exercise prescription  Provide education, explanation, and written materials on  patient's individual exercise prescription      Expected Outcomes  Short Term: Able to explain program exercise prescription;Long Term: Able to explain home exercise prescription to exercise independently  Short Term: Able to explain program exercise prescription;Long Term: Able to explain home exercise prescription to exercise independently         Copy of goals given to participant.

## 2019-03-31 NOTE — Progress Notes (Signed)
Pulmonary Individual Treatment Plan  Patient Details  Name: Eduardo [name] MRN: [id number] Date of Birth: 06-07-1931 Referring Provider:     Pulmonary Rehab from 03/31/2019 in [facility] Cardiac and Pulmonary Rehab  Referring Provider  [place]      Initial Encounter Date:    Pulmonary Rehab from 03/31/2019 in [facility] Cardiac and Pulmonary Rehab  Date  03/31/19      Visit Diagnosis: Heart failure, diastolic, chronic ([place])  Patient's Home Medications on Admission:  Current Outpatient Medications:  .  acetaminophen (TYLENOL) 650 MG CR tablet, Take 1,300 mg by mouth 2 (two) times daily., Disp: , Rfl:  .  albuterol (PROVENTIL) (2.5 MG/3ML) 0.083% nebulizer solution, Use 1 vial in nebulizer every 4 hours as needed for wheezing or shortness of breath, Disp: 270 mL, Rfl: 2 .  ALPRAZolam (XANAX) 0.25 MG tablet, Take 1 tablet (0.25 mg total) by mouth 2 (two) times daily as needed for anxiety., Disp: 20 tablet, Rfl: 0 .  apixaban (ELIQUIS) 5 MG TABS tablet, Take 1 tablet (5 mg total) by mouth 2 (two) times daily., Disp: 180 tablet, Rfl: 3 .  atorvastatin (LIPITOR) 80 MG tablet, TAKE ONE TABLET BY MOUTH ONE TIME DAILY , Disp: 30 tablet, Rfl: 2 .  B Complex-C (SUPER B COMPLEX PO), Take 1 tablet by mouth at bedtime. , Disp: , Rfl:  .  cetirizine (ZYRTEC) 10 MG tablet, Take 10 mg by mouth at bedtime. , Disp: , Rfl:  .  cholecalciferol (VITAMIN D) 1000 units tablet, Take 1,000 Units by mouth daily., Disp: , Rfl:  .  escitalopram (LEXAPRO) 20 MG tablet, Take 20 mg by mouth at bedtime. , Disp: , Rfl:  .  fenofibrate 160 MG tablet, TAKE ONE TABLET BY MOUTH DAILY AT BEDTIME , Disp: 90 tablet, Rfl: 3 .  furosemide (LASIX) 40 MG tablet, Take 1 tablet (40 mg total) by mouth daily., Disp: 90 tablet, Rfl: 3 .  hydrALAZINE (APRESOLINE) 10 MG tablet, Take 1 tablet (10 mg total) by mouth 3 (three) times daily., Disp: 270 tablet, Rfl: 3 .  ipratropium (ATROVENT) 0.03 % nasal spray, PLACE 2 SPRAYS INTO BOTH NOSTRILS  AT BEDTIME, Disp: 30 mL, Rfl: 2 .  lamoTRIgine (LAMICTAL) 150 MG tablet, Take 150 mg by mouth at bedtime. Reported on 08/14/2015, Disp: , Rfl:  .  montelukast (SINGULAIR) 10 MG tablet, TAKE ONE TABLET BY MOUTH DAILY AT BEDTIME , Disp: 90 tablet, Rfl: 0 .  Multiple Vitamin (MULTIVITAMIN) tablet, Take 1 tablet by mouth at bedtime. , Disp: , Rfl:  .  nitroGLYCERIN (NITROSTAT) 0.4 MG SL tablet, Place 1 tablet (0.4 mg total) under the tongue every 5 (five) minutes as needed for chest pain., Disp: 90 tablet, Rfl: 3 .  pantoprazole (PROTONIX) 40 MG tablet, TAKE 1 TABLET BY MOUTH DAILY BEFORE BREAKFAST., Disp: 90 tablet, Rfl: 3 .  Tiotropium Bromide-Olodaterol (STIOLTO RESPIMAT) 2.5-2.5 MCG/ACT AERS, Inhale 2 puffs into the lungs daily., Disp: 3 Inhaler, Rfl: 1 .  triamcinolone cream (KENALOG) 0.1 %, Apply 1 application topically 2 (two) times daily as needed (for skin). , Disp: , Rfl:  .  verapamil (CALAN-SR) 240 MG CR tablet, Take 1 tablet (240 mg total) by mouth at bedtime., Disp: 90 tablet, Rfl: 2  Past Medical History: Past Medical History:  Diagnosis Date  . Allergy   . Anemia 2012, 08/2015   chronic. Acute due to large hematoma 2013. 2017: due to gastric AVM bleeding.   . Anxiety   . CAD (coronary artery disease)  a. 06/2010 Cath: LM nl, LAD 50p/m, LCX 24m RCA 50p/m -->catheter dissection but good flow, 70d, RPDA 50-70;  b. 06/2010 Relook cath due to bradycardia and inf ST elev: RCA dissection @ ostium extending into coronary cusp and prox RCA, Ao dissection-->less staining than previously-->Med Rx; c. 08/2018 MV: No ischemia.  . Chronic combined systolic (congestive) and diastolic (congestive) heart failure ([place]    a. 12/2014 Echo: EF 60-65%; b. 07/2016 Echo: EF 45-50%, mild LVH. Mild AS/MS. Mod-sev MR. Mild to mod TR. PASP 547mg; c. 08/2018 Echo: EF 55-60%, RVSP 68.4m58m. Sev dil LA, mod dil RA. at least mod MR, mild to mod TR. Mild AS.  . CMarland KitchenPD (chronic obstructive pulmonary disease) ([place]    exertional dyspnea  . Gastric angiodysplasia with hemorrhage 08/2015  . Hx of colonic polyps 2001, 2011   adenomatous 2001. HP 2001, 2011.  . HMarland Kitchenperlipidemia   . Mitral regurgitation    a. 07/2016 Echo: Mod to sev MR; b. 08/2018 Echo: at least moderate MR.  . Myocardial infarction ([place]/2012.  12/2011.   "twice; 1 day apart; during/after cath". NQMI in setting severe anemia 2013.   . NMarland Kitchenuropathy 2014   in feet, likely due to spinal stenosis, DDD, HNP  . Osteoarthritis 2002   spinal stenosis, spondylolisthesis, disc protrusion multilevel in lumbar spine  . Permanent atrial fibrillation ([place]   a.  CHA2DS2VASc = 6-->eliquis;  b. 12/2015 Echo: EF 60-65%, no rwma, LVH, mild AS/MS/MR, sev dil LA, mild TR, PASP 67m72m  . Stroke (HCC)[place]  . Upper GI bleed 08/2015   EGD: 2 small gastric AVMs, 1 actively bleeding.  Both treated with APC ablation, clipping of the bleeder    Tobacco Use: Social History   Tobacco Use  Smoking Status Former Smoker  . Packs/day: 2.50  . Years: 30.00  . Pack years: 75.00  . Types: Cigarettes  . Quit date: 06/11/1978  . Years since quitting: 40.8  Smokeless Tobacco Never Used    Labs: Recent ReviChemical engineerLabs for [facility] Cardiac and Pulmonary Rehab Latest Ref Rng & Units 05/18/2016 07/13/2016 07/14/2016 07/18/2016 06/24/2017   Cholestrol 0 - 200 mg/dL 134 - - - 132   LDLCALC 0 - 99 mg/dL - - - - -   LDLDIRECT mg/dL 65.0 - - - 69.0   HDL >39.00 mg/dL 42.80 - - - 44.80   Trlycerides 0.0 - 149.0 mg/dL 952.0 Triglyceride is over 400; calculations on Lipids are invalid.(H) - - - 1031.0 Triglyceride is over 400; calculations on Lipids are invalid.(H)   Hemoglobin A1c 4.8 - 5.6 % - - - 5.4 -   PHART 7.350 - 7.450 - - 7.403 - -   PCO2ART 32.0 - 48.0 mmHg - - 33.5 - -   HCO3 20.0 - 28.0 mmol/L - - 20.9 - -   TCO2 0 - 100 mmol/L - 24 22 - -   ACIDBASEDEF 0.0 - 2.0 mmol/L - - 3.0(H) - -   O2SAT % - - 98.0 - -       Pulmonary Assessment Scores:    UCSD: Self-administered rating of dyspnea associated with activities of daily living (ADLs) 6-point scale (0 = "not at all" to 5 = "maximal or unable to do because of breathlessness")  Scoring Scores range from 0 to 120.  Minimally important difference is 5 units  CAT: CAT can identify the health impairment of COPD patients and is better correlated with disease progression.  CAT has a  scoring range of zero to 40. The CAT score is classified into four groups of low (less than 10), medium (10 - 20), high (21-30) and very high (31-40) based on the impact level of disease on health status. A CAT score over 10 suggests significant symptoms.  A worsening CAT score could be explained by an exacerbation, poor medication adherence, poor inhaler technique, or progression of COPD or comorbid conditions.  CAT MCID is 2 points  mMRC: mMRC (Modified Medical Research Council) Dyspnea Scale is used to assess the degree of baseline functional disability in patients of respiratory disease due to dyspnea. No minimal important difference is established. A decrease in score of 1 point or greater is considered a positive change.   Pulmonary Function Assessment:   Exercise Target Goals: Exercise Program Goal: Individual exercise prescription set using results from initial 6 min walk test and THRR while considering  patient's activity barriers and safety.   Exercise Prescription Goal: Initial exercise prescription builds to 30-45 minutes a day of aerobic activity, 2-3 days per week.  Home exercise guidelines will be given to patient during program as part of exercise prescription that the participant will acknowledge.  Activity Barriers & Risk Stratification:   6 Minute Walk: 6 Minute Walk    Row Name 03/31/19 1354         6 Minute Walk   Phase  Initial     Distance  400 feet     Walk Time  4.35 minutes     # of Rest Breaks  1     MPH  0.75     METS  0.16     RPE  11     Perceived Dyspnea   2      VO2 Peak  0.57     Symptoms  No     Resting HR  66 bpm     Resting BP  132/64     Resting Oxygen Saturation   97 %     Exercise Oxygen Saturation  during 6 min walk  91 %     Max Ex. HR  87 bpm     Max Ex. BP  150/60     2 Minute Post BP  118/60       Interval HR   1 Minute HR  70     2 Minute HR  87     4 Minute HR  81     5 Minute HR  81     6 Minute HR  80     2 Minute Post HR  72     Interval Heart Rate?  Yes       Interval Oxygen   Interval Oxygen?  Yes     Baseline Oxygen Saturation %  97 %     1 Minute Oxygen Saturation %  90 %     1 Minute Liters of Oxygen  0 L     2 Minute Oxygen Saturation %  90 %     2 Minute Liters of Oxygen  0 L     3 Minute Liters of Oxygen  0 L     4 Minute Oxygen Saturation %  92 %     4 Minute Liters of Oxygen  0 L     5 Minute Oxygen Saturation %  91 %     5 Minute Liters of Oxygen  0 L     6 Minute Oxygen Saturation %  94 %  6 Minute Liters of Oxygen  0 L     2 Minute Post Oxygen Saturation %  94 %     2 Minute Post Liters of Oxygen  0 L       Oxygen Initial Assessment: Oxygen Initial Assessment - 03/26/19 1639      Home Oxygen   Home Oxygen Device  None    Sleep Oxygen Prescription  None    Home Exercise Oxygen Prescription  None    Home at Rest Exercise Oxygen Prescription  None      Initial 6 min Walk   Oxygen Used  None      Program Oxygen Prescription   Program Oxygen Prescription  None      Intervention   Short Term Goals  To learn and understand importance of monitoring SPO2 with pulse oximeter and demonstrate accurate use of the pulse oximeter.;To learn and understand importance of maintaining oxygen saturations>88%;To learn and demonstrate proper pursed lip breathing techniques or other breathing techniques.;To learn and demonstrate proper use of respiratory medications    Long  Term Goals  Verbalizes importance of monitoring SPO2 with pulse oximeter and return demonstration;Maintenance of O2  saturations>88%;Exhibits proper breathing techniques, such as pursed lip breathing or other method taught during program session;Compliance with respiratory medication;Demonstrates proper use of MDI's       Oxygen Re-Evaluation:   Oxygen Discharge (Final Oxygen Re-Evaluation):   Initial Exercise Prescription: Initial Exercise Prescription - 03/31/19 1300      Date of Initial Exercise RX and Referring Provider   Date  03/31/19    Referring Provider  [place]vak      Treadmill   MPH  0.5    Grade  0    Minutes  2    METs  0.5      Recumbant Bike   Level  1    RPM  50    Watts  15    Minutes  10    METs  1      NuStep   Level  1    SPM  80    Minutes  15    METs  1.5      REL-XR   Level  1    Watts  20    Speed  30    Minutes  10    METs  1.5      T5 Nustep   Level  1    SPM  50    Minutes  5    METs  1      Biostep-RELP   Level  1    SPM  50    Minutes  5    METs  1      Prescription Details   Duration  Progress to 30 minutes of continuous aerobic without signs/symptoms of physical distress      Intensity   Ratings of Perceived Exertion  11-15    Perceived Dyspnea  0-4      Progression   Progression  Continue progressive overload as per policy without signs/symptoms or physical distress.      Resistance Training   Training Prescription  Yes    Weight  3    Reps  10-15       Perform Capillary Blood Glucose checks as needed.  Exercise Prescription Changes:   Exercise Comments:   Exercise Goals and Review: Exercise Goals    Row Name 03/26/19 1639 03/31/19 1400  Exercise Goals   Increase Physical Activity  Yes  Yes      Intervention  Provide advice, education, support and counseling about physical activity/exercise needs.;Develop an individualized exercise prescription for aerobic and resistive training based on initial evaluation findings, risk stratification, comorbidities and participant's personal goals.  Provide advice,  education, support and counseling about physical activity/exercise needs.;Develop an individualized exercise prescription for aerobic and resistive training based on initial evaluation findings, risk stratification, comorbidities and participant's personal goals.      Expected Outcomes  Short Term: Attend rehab on a regular basis to increase amount of physical activity.;Long Term: Add in home exercise to make exercise part of routine and to increase amount of physical activity.;Long Term: Exercising regularly at least 3-5 days a week.  Short Term: Attend rehab on a regular basis to increase amount of physical activity.;Long Term: Add in home exercise to make exercise part of routine and to increase amount of physical activity.;Long Term: Exercising regularly at least 3-5 days a week.      Increase Strength and Stamina  Yes  -      Intervention  Provide advice, education, support and counseling about physical activity/exercise needs.;Develop an individualized exercise prescription for aerobic and resistive training based on initial evaluation findings, risk stratification, comorbidities and participant's personal goals.  Provide advice, education, support and counseling about physical activity/exercise needs.;Develop an individualized exercise prescription for aerobic and resistive training based on initial evaluation findings, risk stratification, comorbidities and participant's personal goals.      Expected Outcomes  Short Term: Increase workloads from initial exercise prescription for resistance, speed, and METs.;Short Term: Perform resistance training exercises routinely during rehab and add in resistance training at home;Long Term: Improve cardiorespiratory fitness, muscular endurance and strength as measured by increased METs and functional capacity (6MWT)  Short Term: Increase workloads from initial exercise prescription for resistance, speed, and METs.;Short Term: Perform resistance training exercises  routinely during rehab and add in resistance training at home;Long Term: Improve cardiorespiratory fitness, muscular endurance and strength as measured by increased METs and functional capacity (6MWT)      Able to understand and use rate of perceived exertion (RPE) scale  Yes  Yes      Intervention  Provide education and explanation on how to use RPE scale  Provide education and explanation on how to use RPE scale      Expected Outcomes  Short Term: Able to use RPE daily in rehab to express subjective intensity level;Long Term:  Able to use RPE to guide intensity level when exercising independently  Short Term: Able to use RPE daily in rehab to express subjective intensity level;Long Term:  Able to use RPE to guide intensity level when exercising independently      Able to understand and use Dyspnea scale  Yes  Yes      Intervention  Provide education and explanation on how to use Dyspnea scale  Provide education and explanation on how to use Dyspnea scale      Expected Outcomes  Short Term: Able to use Dyspnea scale daily in rehab to express subjective sense of shortness of breath during exertion;Long Term: Able to use Dyspnea scale to guide intensity level when exercising independently  Short Term: Able to use Dyspnea scale daily in rehab to express subjective sense of shortness of breath during exertion;Long Term: Able to use Dyspnea scale to guide intensity level when exercising independently      Knowledge and understanding of Target Heart Rate Range (THRR)  Yes  Yes      Intervention  Provide education and explanation of THRR including how the numbers were predicted and where they are located for reference  Provide education and explanation of THRR including how the numbers were predicted and where they are located for reference      Expected Outcomes  Short Term: Able to state/look up THRR;Short Term: Able to use daily as guideline for intensity in rehab;Long Term: Able to use THRR to govern  intensity when exercising independently  Short Term: Able to state/look up THRR;Short Term: Able to use daily as guideline for intensity in rehab;Long Term: Able to use THRR to govern intensity when exercising independently      Able to check pulse independently  Yes  Yes      Intervention  Provide education and demonstration on how to check pulse in carotid and radial arteries.;Review the importance of being able to check your own pulse for safety during independent exercise  Provide education and demonstration on how to check pulse in carotid and radial arteries.;Review the importance of being able to check your own pulse for safety during independent exercise      Expected Outcomes  Short Term: Able to explain why pulse checking is important during independent exercise;Long Term: Able to check pulse independently and accurately  Short Term: Able to explain why pulse checking is important during independent exercise;Long Term: Able to check pulse independently and accurately      Understanding of Exercise Prescription  Yes  Yes      Intervention  Provide education, explanation, and written materials on patient's individual exercise prescription  Provide education, explanation, and written materials on patient's individual exercise prescription      Expected Outcomes  Short Term: Able to explain program exercise prescription;Long Term: Able to explain home exercise prescription to exercise independently  Short Term: Able to explain program exercise prescription;Long Term: Able to explain home exercise prescription to exercise independently         Exercise Goals Re-Evaluation :   Discharge Exercise Prescription (Final Exercise Prescription Changes):   Nutrition:  Target Goals: Understanding of nutrition guidelines, daily intake of sodium <1553m, cholesterol <2080m calories 30% from fat and 7% or less from saturated fats, daily to have 5 or more servings of fruits and  vegetables.  Biometrics: Pre Biometrics - 03/31/19 1400      Pre Biometrics   Height  '5\' 2"'  (1.575 m)    Weight  142 lb 1.6 oz (64.5 kg)    BMI (Calculated)  25.98        Nutrition Therapy Plan and Nutrition Goals:   Nutrition Assessments:   Nutrition Goals Re-Evaluation:   Nutrition Goals Discharge (Final Nutrition Goals Re-Evaluation):   Psychosocial: Target Goals: Acknowledge presence or absence of significant depression and/or stress, maximize coping skills, provide positive support system. Participant is able to verbalize types and ability to use techniques and skills needed for reducing stress and depression.   Initial Review & Psychosocial Screening: Initial Psych Review & Screening - 03/26/19 1640      Initial Review   Current issues with  Current Psychotropic Meds;Current Anxiety/Panic      Family Dynamics   Good Support System?  Yes    Comments  He can look to his wife, daughter and son for support. He talks with a therapist about his anxiety.      Barriers   Psychosocial barriers to participate in program  The patient should benefit from training in  stress management and relaxation.      Screening Interventions   Interventions  Encouraged to exercise;To provide support and resources with identified psychosocial needs;Provide feedback about the scores to participant    Expected Outcomes  Short Term goal: Utilizing psychosocial counselor, staff and physician to assist with identification of specific Stressors or current issues interfering with healing process. Setting desired goal for each stressor or current issue identified.;Long Term Goal: Stressors or current issues are controlled or eliminated.;Long Term goal: The participant improves quality of Life and PHQ9 Scores as seen by post scores and/or verbalization of changes;Short Term goal: Identification and review with participant of any Quality of Life or Depression concerns found by scoring the questionnaire.        Quality of Life Scores:  Scores of 19 and below usually indicate a poorer quality of life in these areas.  A difference of  2-3 points is a clinically meaningful difference.  A difference of 2-3 points in the total score of the Quality of Life Index has been associated with significant improvement in overall quality of life, self-image, physical symptoms, and general health in studies assessing change in quality of life.  PHQ-9: Recent Review Flowsheet Data    Depression screen [facility] 2/9 06/24/2017 05/29/2016   Decreased Interest 0 0   Down, Depressed, Hopeless 0 0   PHQ - 2 Score 0 0     Interpretation of Total Score  Total Score Depression Severity:  1-4 = Minimal depression, 5-9 = Mild depression, 10-14 = Moderate depression, 15-19 = Moderately severe depression, 20-27 = Severe depression   Psychosocial Evaluation and Intervention:   Psychosocial Re-Evaluation:   Psychosocial Discharge (Final Psychosocial Re-Evaluation):   Education: Education Goals: Education classes will be provided on a weekly basis, covering required topics. Participant will state understanding/return demonstration of topics presented.  Learning Barriers/Preferences: Learning Barriers/Preferences - 03/26/19 1642      Learning Barriers/Preferences   Learning Barriers  None    Learning Preferences  None       Education Topics:  Initial Evaluation Education: - Verbal, written and demonstration of respiratory meds, oximetry and breathing techniques. Instruction on use of nebulizers and MDIs and importance of monitoring MDI activations.   Pulmonary Rehab from 03/31/2019 in [facility] Cardiac and Pulmonary Rehab  Date  03/31/19  Educator  [place]  Instruction Review Code  1- Verbalizes Understanding      General Nutrition Guidelines/Fats and Fiber: -Group instruction provided by verbal, written material, models and posters to present the general guidelines for heart healthy nutrition. Gives an explanation  and review of dietary fats and fiber.   Controlling Sodium/Reading Food Labels: -Group verbal and written material supporting the discussion of sodium use in heart healthy nutrition. Review and explanation with models, verbal and written materials for utilization of the food label.   Exercise Physiology & General Exercise Guidelines: - Group verbal and written instruction with models to review the exercise physiology of the cardiovascular system and associated critical values. Provides general exercise guidelines with specific guidelines to those with heart or lung disease.    Aerobic Exercise & Resistance Training: - Gives group verbal and written instruction on the various components of exercise. Focuses on aerobic and resistive training programs and the benefits of this training and how to safely progress through these programs.   Flexibility, Balance, Mind/Body Relaxation: Provides group verbal/written instruction on the benefits of flexibility and balance training, including mind/body exercise modes such as yoga, pilates and tai chi.  Demonstration and  skill practice provided.   Stress and Anxiety: - Provides group verbal and written instruction about the health risks of elevated stress and causes of high stress.  Discuss the correlation between heart/lung disease and anxiety and treatment options. Review healthy ways to manage with stress and anxiety.   Depression: - Provides group verbal and written instruction on the correlation between heart/lung disease and depressed mood, treatment options, and the stigmas associated with seeking treatment.   Exercise & Equipment Safety: - Individual verbal instruction and demonstration of equipment use and safety with use of the equipment.   Infection Prevention: - Provides verbal and written material to individual with discussion of infection control including proper hand washing and proper equipment cleaning during exercise session.    Pulmonary Rehab from 03/31/2019 in [facility] Cardiac and Pulmonary Rehab  Date  03/31/19  Educator  [place]  Instruction Review Code  1- Verbalizes Understanding      Falls Prevention: - Provides verbal and written material to individual with discussion of falls prevention and safety.   Pulmonary Rehab from 03/31/2019 in [facility] Cardiac and Pulmonary Rehab  Date  03/31/19  Educator  [place]  Instruction Review Code  1- Verbalizes Understanding      Diabetes: - Individual verbal and written instruction to review signs/symptoms of diabetes, desired ranges of glucose level fasting, after meals and with exercise. Advice that pre and post exercise glucose checks will be done for 3 sessions at entry of program.   Chronic Lung Diseases: - Group verbal and written instruction to review updates, respiratory medications, advancements in procedures and treatments. Discuss use of supplemental oxygen including available portable oxygen systems, continuous and intermittent flow rates, concentrators, personal use and safety guidelines. Review proper use of inhaler and spacers. Provide informative websites for self-education.    Energy Conservation: - Provide group verbal and written instruction for methods to conserve energy, plan and organize activities. Instruct on pacing techniques, use of adaptive equipment and posture/positioning to relieve shortness of breath.   Triggers and Exacerbations: - Group verbal and written instruction to review types of environmental triggers and ways to prevent exacerbations. Discuss weather changes, air quality and the benefits of nasal washing. Review warning signs and symptoms to help prevent infections. Discuss techniques for effective airway clearance, coughing, and vibrations.   AED/CPR: - Group verbal and written instruction with the use of models to demonstrate the basic use of the AED with the basic ABC's of resuscitation.   Anatomy and Physiology of the Lungs: - Group  verbal and written instruction with the use of models to provide basic lung anatomy and physiology related to function, structure and complications of lung disease.   Anatomy & Physiology of the Heart: - Group verbal and written instruction and models provide basic cardiac anatomy and physiology, with the coronary electrical and arterial systems. Review of Valvular disease and Heart Failure   Cardiac Medications: - Group verbal and written instruction to review commonly prescribed medications for heart disease. Reviews the medication, class of the drug, and side effects.   Know Your Numbers and Risk Factors: -Group verbal and written instruction about important numbers in your health.  Discussion of what are risk factors and how they play a role in the disease process.  Review of Cholesterol, Blood Pressure, Diabetes, and BMI and the role they play in your overall health.   Sleep Hygiene: -Provides group verbal and written instruction about how sleep can affect your health.  Define sleep hygiene, discuss sleep cycles and impact  of sleep habits. Review good sleep hygiene tips.    Other: -Provides group and verbal instruction on various topics (see comments)    Knowledge Questionnaire Score:    Core Components/Risk Factors/Patient Goals at Admission: Personal Goals and Risk Factors at Admission - 03/26/19 1642      Core Components/Risk Factors/Patient Goals on Admission    Weight Management  Weight Loss;Yes    Intervention  Weight Management: Develop a combined nutrition and exercise program designed to reach desired caloric intake, while maintaining appropriate intake of nutrient and fiber, sodium and fats, and appropriate energy expenditure required for the weight goal.;Weight Management: Provide education and appropriate resources to help participant work on and attain dietary goals.    Expected Outcomes  Short Term: Continue to assess and modify interventions until short term  weight is achieved;Long Term: Adherence to nutrition and physical activity/exercise program aimed toward attainment of established weight goal;Weight Loss: Understanding of general recommendations for a balanced deficit meal plan, which promotes 1-2 lb weight loss per week and includes a negative energy balance of [phone] kcal/d;Understanding of distribution of calorie intake throughout the day with the consumption of 4-5 meals/snacks    Improve shortness of breath with ADL's  Yes    Intervention  Provide education, individualized exercise plan and daily activity instruction to help decrease symptoms of SOB with activities of daily living.    Expected Outcomes  Short Term: Improve cardiorespiratory fitness to achieve a reduction of symptoms when performing ADLs;Long Term: Be able to perform more ADLs without symptoms or delay the onset of symptoms    Heart Failure  Yes    Intervention  Provide a combined exercise and nutrition program that is supplemented with education, support and counseling about heart failure. Directed toward relieving symptoms such as shortness of breath, decreased exercise tolerance, and extremity edema.    Expected Outcomes  Improve functional capacity of life;Short term: Attendance in program 2-3 days a week with increased exercise capacity. Reported lower sodium intake. Reported increased fruit and vegetable intake. Reports medication compliance.;Short term: Daily weights obtained and reported for increase. Utilizing diuretic protocols set by physician.;Long term: Adoption of self-care skills and reduction of barriers for early signs and symptoms recognition and intervention leading to self-care maintenance.    Hypertension  Yes    Intervention  Provide education on lifestyle modifcations including regular physical activity/exercise, weight management, moderate sodium restriction and increased consumption of fresh fruit, vegetables, and low fat dairy, alcohol moderation, and  smoking cessation.;Monitor prescription use compliance.    Expected Outcomes  Short Term: Continued assessment and intervention until BP is < 140/15m HG in hypertensive participants. < 130/82mHG in hypertensive participants with diabetes, heart failure or chronic kidney disease.;Long Term: Maintenance of blood pressure at goal levels.    Lipids  Yes    Intervention  Provide education and support for participant on nutrition & aerobic/resistive exercise along with prescribed medications to achieve LDL <7027mHDL >72m38m  Expected Outcomes  Short Term: Participant states understanding of desired cholesterol values and is compliant with medications prescribed. Participant is following exercise prescription and nutrition guidelines.;Long Term: Cholesterol controlled with medications as prescribed, with individualized exercise RX and with personalized nutrition plan. Value goals: LDL < 70mg35mL > 40 mg.       Core Components/Risk Factors/Patient Goals Review:    Core Components/Risk Factors/Patient Goals at Discharge (Final Review):    ITP Comments: ITP Comments    Row Name 03/26/19 1658  ITP Comments  Virtual Orientation complete. Visit diagnosis can be found in [facility]. RD and EP evaluation date on 03/31/2019 0930.          Comments: Initial ITP

## 2019-04-02 ENCOUNTER — Encounter: Payer: PPO | Admitting: *Deleted

## 2019-04-02 ENCOUNTER — Other Ambulatory Visit: Payer: Self-pay

## 2019-04-02 DIAGNOSIS — I5030 Unspecified diastolic (congestive) heart failure: Secondary | ICD-10-CM | POA: Diagnosis not present

## 2019-04-02 DIAGNOSIS — I5032 Chronic diastolic (congestive) heart failure: Secondary | ICD-10-CM

## 2019-04-02 NOTE — Progress Notes (Signed)
Daily Session Note  Patient Details  Name: Eduardo [name] MRN: [id number] Date of Birth: 12/28/1930 Referring Provider:     Pulmonary Rehab from 03/31/2019 in [facility] Cardiac and Pul[facility] Rehab  Referring Provider  [place]      Encounter Date: 04/02/2019  Check In: Session Check In - 04/02/19 0901      Check-In   Supervising physician immediately available to respond to emergencies  See telemetry face sheet for immediately available ER MD    Location  [facility] & Pul[facility] Re[facility]    Staff Present   , RN, BSN, CCRP;Jeanna Durrell BS, Exercise Physiologist;Amanda Sommer, BA, ACSM CEP, Exercise Physiologist    Virtual Visit  No    Medication changes reported      No    Fall or balance concerns reported     No    Warm-up and Cool-down  Performed on first and last piece of equipment    Resistance Training Performed  Yes    VAD Patient?  No    PAD/SET Patient?  No      Pain Assessment   Currently in Pain?  No/denies          Social History   Tobacco Use  Smoking Status Former Smoker  . Packs/day: 2.50  . Years: 30.00  . Pack years: 75.00  . Types: Cigarettes  . Quit date: 06/11/1978  . Years since quitting: 40.8  Smokeless Tobacco Never Used    Goals Met:  Exercise tolerated well No report of cardiac concerns or symptoms  Goals Unmet:  Not Applicable  Comments: First full day of exercise!  Patient was oriented to gym and equipment including functions, settings, policies, and procedures.  Patient's individual exercise prescription and treatment plan were reviewed.  All starting workloads were established based on the results of the 6 minute walk test done at initial orientation visit.  The plan for exercise progression was also introduced and progression will be customized based on patient's performance and goals.    Dr. Mark Miller is Medical Director for [facility] and [name]. 

## 2019-04-03 ENCOUNTER — Encounter: Payer: PPO | Admitting: *Deleted

## 2019-04-03 DIAGNOSIS — I5032 Chronic diastolic (congestive) heart failure: Secondary | ICD-10-CM

## 2019-04-03 DIAGNOSIS — I5030 Unspecified diastolic (congestive) heart failure: Secondary | ICD-10-CM | POA: Diagnosis not present

## 2019-04-03 NOTE — Progress Notes (Signed)
Daily Session Note  Patient Details  Name: Eduardo [name] MRN: [id number] Date of Birth: Oct 19, 1930 Referring Provider:     Pulmonary Rehab from 03/31/2019 in [facility]  Referring Provider  [place]      Encounter Date: 04/03/2019  Check In: Session Check In - 04/03/19 0846      Check-In   Supervising physician immediately available to respond to emergencies  See telemetry face sheet for immediately available ER MD    Location  [facility]    Staff Present  Heath Lark, RN, BSN, CCRP;Jessica [place], MA, RCEP, CCRP, [place], [place], ACSM CEP, Exercise Physiologist    Virtual Visit  No    Medication changes reported      No    Fall or balance concerns reported     No    Warm-up and Cool-down  Performed on first and last piece of equipment    Resistance Training Performed  Yes    VAD Patient?  No    PAD/SET Patient?  No      Pain Assessment   Currently in Pain?  No/denies          Social History   Tobacco Use  Smoking Status Former Smoker  . Packs/day: 2.50  . Years: 30.00  . Pack years: 75.00  . Types: Cigarettes  . Quit date: 06/11/1978  . Years since quitting: 40.8  Smokeless Tobacco Never Used    Goals Met:  Independence with exercise equipment Exercise tolerated well No report of cardiac concerns or symptoms  Goals Unmet:  Not Applicable  Comments: Pt able to follow exercise prescription today without complaint.  Will continue to monitor for progression.    Dr. Emily Filbert is Medical Director for [place] and [name].

## 2019-04-07 ENCOUNTER — Other Ambulatory Visit: Payer: Self-pay

## 2019-04-07 ENCOUNTER — Encounter: Payer: PPO | Admitting: *Deleted

## 2019-04-07 DIAGNOSIS — I5030 Unspecified diastolic (congestive) heart failure: Secondary | ICD-10-CM | POA: Diagnosis not present

## 2019-04-07 DIAGNOSIS — I5032 Chronic diastolic (congestive) heart failure: Secondary | ICD-10-CM

## 2019-04-07 NOTE — Progress Notes (Signed)
Daily Session Note  Patient Details  Name: Eduardo [name] MRN: [id number] Date of Birth: 06-19-1930 Referring Provider:     Pulmonary Rehab from 03/31/2019 in [facility] and Pulmonary Rehab  Referring Provider  [place]      Encounter Date: 04/07/2019  Check In: Session Check In - 04/07/19 0840      Check-In   Supervising physician immediately available to respond to emergencies  See telemetry face sheet for immediately available ER MD    Staff Present  Darel Hong, RN BSN;Joseph Foy [place], [place], ACSM CEP, Exercise Physiologist    Virtual Visit  No    Medication changes reported      No    Fall or balance concerns reported     No    Warm-up and Cool-down  Performed on first and last piece of equipment    Resistance Training Performed  Yes    VAD Patient?  No    PAD/SET Patient?  No      Pain Assessment   Currently in Pain?  No/denies          Social History   Tobacco Use  Smoking Status Former Smoker  . Packs/day: 2.50  . Years: 30.00  . Pack years: 75.00  . Types: Cigarettes  . Quit date: 06/11/1978  . Years since quitting: 40.8  Smokeless Tobacco Never Used    Goals Met:  Proper associated with RPD/PD & O2 Sat Independence with exercise equipment Using PLB without cueing & demonstrates good technique Exercise tolerated well Strength training completed today  Goals Unmet:  Not Applicable  Comments: Pt able to follow exercise prescription today without complaint.  Will continue to monitor for progression.    Dr. Emily Filbert is Medical Director for [place] and [name].

## 2019-04-09 ENCOUNTER — Encounter: Payer: PPO | Admitting: *Deleted

## 2019-04-09 ENCOUNTER — Other Ambulatory Visit: Payer: Self-pay

## 2019-04-09 DIAGNOSIS — I5030 Unspecified diastolic (congestive) heart failure: Secondary | ICD-10-CM | POA: Diagnosis not present

## 2019-04-09 DIAGNOSIS — I5032 Chronic diastolic (congestive) heart failure: Secondary | ICD-10-CM

## 2019-04-09 NOTE — Progress Notes (Signed)
Daily Session Note  Patient Details  Name: Eduardo [name] MRN: [id number] Date of Birth: 03-20-1931 Referring Provider:     Pulmonary Rehab from 03/31/2019 in [facility] Card[facility] and Pulmonary Rehab  Referring Provider  [place]      Encounter Date: 04/09/2019  Check In: Session Check In - 04/09/19 7471      Check-In   Supervising physician immediately available to respond to emergencies  See telemetry face sheet for immediately available ER MD    Staff Present  Heath Lark, RN, BSN, CCRP;Amanda Sommer, BA, ACSM CEP, Exercise Physiologist;Jeanna Durrell BS, Exercise Physiologist    Virtual Visit  No    Medication changes reported      No    Fall or balance concerns reported     Yes    Comments  fell-slipped in shower on soap    no injurt reported    Warm-up and Cool-down  Performed on first and last piece of equipment    Resistance Training Performed  Yes    VAD Patient?  No    PAD/SET Patient?  No      Pain Assessment   Currently in Pain?  No/denies          Social History   Tobacco Use  Smoking Status Former Smoker  . Packs/day: 2.50  . Years: 30.00  . Pack years: 75.00  . Types: Cigarettes  . Quit date: 06/11/1978  . Years since quitting: 40.8  Smokeless Tobacco Never Used    Goals Met:  Independence with exercise equipment Exercise tolerated well No report of cardiac concerns or symptoms  Goals Unmet:  Not Applicable  Comments: Pt able to follow exercise prescription today without complaint.  Will continue to monitor for progression.    Dr. Emily Filbert is Medical Director for [place] and [name].

## 2019-04-10 ENCOUNTER — Encounter: Payer: PPO | Admitting: *Deleted

## 2019-04-10 DIAGNOSIS — I5030 Unspecified diastolic (congestive) heart failure: Secondary | ICD-10-CM | POA: Diagnosis not present

## 2019-04-10 DIAGNOSIS — I5032 Chronic diastolic (congestive) heart failure: Secondary | ICD-10-CM

## 2019-04-10 NOTE — Progress Notes (Signed)
Daily Session Note  Patient Details  Name: Eduardo [name] MRN: [id number] Date of Birth: 02-03-1931 Referring Provider:     Pulmonary Rehab from 03/31/2019 in [facility] Re[facility]  Referring Provider  [place]      Encounter Date: 04/10/2019  Check In: Session Check In - 04/10/19 0829      Check-In   Supervising physician immediately available to respond to emergencies  See telemetry face sheet for immediately available ER MD    Location  [facility]    Staff Present  Heath Lark, RN, BSN, CCRP;Jessica [place], MA, RCEP, CCRP, [place], [place], ACSM CEP, Exercise Physiologist;Joseph Ferguson [place]    Virtual Visit  No    Medication changes reported      No    Fall or balance concerns reported     No    Warm-up and Cool-down  Performed on first and last piece of equipment    Resistance Training Performed  Yes    VAD Patient?  No    PAD/SET Patient?  No      Pain Assessment   Currently in Pain?  No/denies          Social History   Tobacco Use  Smoking Status Former Smoker  . Packs/day: 2.50  . Years: 30.00  . Pack years: 75.00  . Types: Cigarettes  . Quit date: 06/11/1978  . Years since quitting: 40.8  Smokeless Tobacco Never Used    Goals Met:  Independence with exercise equipment Exercise tolerated well No report of cardiac concerns or symptoms  Goals Unmet:  Not Applicable  Comments: Pt able to follow exercise prescription today without complaint.  Will continue to monitor for progression.    Dr. Emily Filbert is Medical Director for [place] and [name].

## 2019-04-13 ENCOUNTER — Other Ambulatory Visit: Payer: Self-pay | Admitting: Cardiovascular Disease

## 2019-04-14 ENCOUNTER — Other Ambulatory Visit: Payer: Self-pay

## 2019-04-14 ENCOUNTER — Encounter: Payer: PPO | Attending: Internal Medicine | Admitting: *Deleted

## 2019-04-14 DIAGNOSIS — I5030 Unspecified diastolic (congestive) heart failure: Secondary | ICD-10-CM | POA: Diagnosis not present

## 2019-04-14 DIAGNOSIS — I5032 Chronic diastolic (congestive) heart failure: Secondary | ICD-10-CM

## 2019-04-14 NOTE — Progress Notes (Signed)
Daily Session Note  Patient Details  Name: Eduardo [name] MRN: [id number] Date of Birth: 03/18/1931 Referring Provider:     Pulmonary Rehab from 03/31/2019 in [facility] Card[facility] and Pulmonary Rehab  Referring Provider  [name]      Encounter Date: 04/14/2019  Check In: Session Check In - 04/14/19 0856      Check-In   Supervising physician immediately available to respond to emergencies  See telemetry face sheet for immediately available ER MD    Location  [facility]    Staff Present  Heath Lark, RN, BSN, CCRP;Joseph Foy [place], [place], ACSM CEP, Exercise Physiologist    Virtual Visit  No    Medication changes reported      No    Fall or balance concerns reported     No    Warm-up and Cool-down  Performed on first and last piece of equipment    Resistance Training Performed  Yes    VAD Patient?  No    PAD/SET Patient?  No      Pain Assessment   Currently in Pain?  No/denies          Social History   Tobacco Use  Smoking Status Former Smoker  . Packs/day: 2.50  . Years: 30.00  . Pack years: 75.00  . Types: Cigarettes  . Quit date: 06/11/1978  . Years since quitting: 40.8  Smokeless Tobacco Never Used    Goals Met:  Independence with exercise equipment Exercise tolerated well No report of cardiac concerns or symptoms  Goals Unmet:  Not Applicable  Comments: Pt able to follow exercise prescription today without complaint.  Will continue to monitor for progression.    Dr. Emily Filbert is Medical Director for [place] and [name].

## 2019-04-15 ENCOUNTER — Encounter: Payer: Self-pay | Admitting: *Deleted

## 2019-04-15 DIAGNOSIS — I5032 Chronic diastolic (congestive) heart failure: Secondary | ICD-10-CM

## 2019-04-15 NOTE — Progress Notes (Signed)
Pulmonary Individual Treatment Plan  Patient Details  Name: Eduardo [name] MRN: [id number] Date of Birth: 16-Nov-1930 Referring Provider:     Pulmonary Rehab from 03/31/2019 in [facility] Cardiac and Pulmonary Rehab  Referring Provider  [place]      Initial Encounter Date:    Pulmonary Rehab from 03/31/2019 in [facility] Cardiac and Pulmonary Rehab  Date  03/31/19      Visit Diagnosis: Heart failure, diastolic, chronic ([place])  Patient's Home Medications on Admission:  Current Outpatient Medications:  .  acetaminophen (TYLENOL) 650 MG CR tablet, Take 1,300 mg by mouth 2 (two) times daily., Disp: , Rfl:  .  albuterol (PROVENTIL) (2.5 MG/3ML) 0.083% nebulizer solution, Use 1 vial in nebulizer every 4 hours as needed for wheezing or shortness of breath, Disp: 270 mL, Rfl: 2 .  ALPRAZolam (XANAX) 0.25 MG tablet, Take 1 tablet (0.25 mg total) by mouth 2 (two) times daily as needed for anxiety., Disp: 20 tablet, Rfl: 0 .  apixaban (ELIQUIS) 5 MG TABS tablet, Take 1 tablet (5 mg total) by mouth 2 (two) times daily., Disp: 180 tablet, Rfl: 3 .  atorvastatin (LIPITOR) 80 MG tablet, TAKE ONE TABLET BY MOUTH ONE TIME DAILY , Disp: 30 tablet, Rfl: 2 .  B Complex-C (SUPER B COMPLEX PO), Take 1 tablet by mouth at bedtime. , Disp: , Rfl:  .  cetirizine (ZYRTEC) 10 MG tablet, Take 10 mg by mouth at bedtime. , Disp: , Rfl:  .  cholecalciferol (VITAMIN D) 1000 units tablet, Take 1,000 Units by mouth daily., Disp: , Rfl:  .  escitalopram (LEXAPRO) 20 MG tablet, Take 20 mg by mouth at bedtime. , Disp: , Rfl:  .  fenofibrate 160 MG tablet, TAKE ONE TABLET BY MOUTH DAILY AT BEDTIME , Disp: 90 tablet, Rfl: 3 .  furosemide (LASIX) 40 MG tablet, Take 1 tablet (40 mg total) by mouth daily., Disp: 90 tablet, Rfl: 3 .  hydrALAZINE (APRESOLINE) 10 MG tablet, Take 1 tablet (10 mg total) by mouth 3 (three) times daily., Disp: 270 tablet, Rfl: 3 .  ipratropium (ATROVENT) 0.03 % nasal spray, PLACE 2 SPRAYS INTO BOTH NOSTRILS  AT BEDTIME, Disp: 30 mL, Rfl: 2 .  lamoTRIgine (LAMICTAL) 150 MG tablet, Take 150 mg by mouth at bedtime. Reported on 08/14/2015, Disp: , Rfl:  .  montelukast (SINGULAIR) 10 MG tablet, TAKE ONE TABLET BY MOUTH DAILY AT BEDTIME , Disp: 90 tablet, Rfl: 0 .  Multiple Vitamin (MULTIVITAMIN) tablet, Take 1 tablet by mouth at bedtime. , Disp: , Rfl:  .  nitroGLYCERIN (NITROSTAT) 0.4 MG SL tablet, Place 1 tablet (0.4 mg total) under the tongue every 5 (five) minutes as needed for chest pain., Disp: 90 tablet, Rfl: 3 .  pantoprazole (PROTONIX) 40 MG tablet, TAKE 1 TABLET BY MOUTH DAILY BEFORE BREAKFAST., Disp: 90 tablet, Rfl: 3 .  Tiotropium Bromide-Olodaterol (STIOLTO RESPIMAT) 2.5-2.5 MCG/ACT AERS, Inhale 2 puffs into the lungs daily., Disp: 3 Inhaler, Rfl: 1 .  triamcinolone cream (KENALOG) 0.1 %, Apply 1 application topically 2 (two) times daily as needed (for skin). , Disp: , Rfl:  .  verapamil (CALAN-SR) 240 MG CR tablet, TAKE ONE TABLET BY MOUTH AT BEDTIME, Disp: 90 tablet, Rfl: 0  Past Medical History: Past Medical History:  Diagnosis Date  . Allergy   . Anemia 2012, 08/2015   chronic. Acute due to large hematoma 2013. 2017: due to gastric AVM bleeding.   . Anxiety   . CAD (coronary artery disease)    a.  06/2010 Cath: LM nl, LAD 50p/m, LCX 75m RCA 50p/m -->catheter dissection but good flow, 70d, RPDA 50-70;  b. 06/2010 Relook cath due to bradycardia and inf ST elev: RCA dissection @ ostium extending into coronary cusp and prox RCA, Ao dissection-->less staining than previously-->Med Rx; c. 08/2018 MV: No ischemia.  . Chronic combined systolic (congestive) and diastolic (congestive) heart failure ([place]    a. 12/2014 Echo: EF 60-65%; b. 07/2016 Echo: EF 45-50%, mild LVH. Mild AS/MS. Mod-sev MR. Mild to mod TR. PASP 567mg; c. 08/2018 Echo: EF 55-60%, RVSP 68.20m18m. Sev dil LA, mod dil RA. at least mod MR, mild to mod TR. Mild AS.  . CMarland KitchenPD (chronic obstructive pulmonary disease) ([id number]   exertional  dyspnea  . Gastric angiodysplasia with hemorrhage 08/2015  . Hx of colonic polyps 2001, 2011   adenomatous 2001. HP 2001, 2011.  . HMarland Kitchenperlipidemia   . Mitral regurgitation    a. 07/2016 Echo: Mod to sev MR; b. 08/2018 Echo: at least moderate MR.  . Myocardial infarction ([facility]/2012.  12/2011.   "twice; 1 day apart; during/after cath". NQMI in setting severe anemia 2013.   . NMarland Kitchenuropathy 2014   in feet, likely due to spinal stenosis, DDD, HNP  . Osteoarthritis 2002   spinal stenosis, spondylolisthesis, disc protrusion multilevel in lumbar spine  . Permanent atrial fibrillation ([id number]   a.  CHA2DS2VASc = 6-->eliquis;  b. 12/2015 Echo: EF 60-65%, no rwma, LVH, mild AS/MS/MR, sev dil LA, mild TR, PASP 78m2m  . Stroke (HCC)[place]16  . Upper GI bleed 08/2015   EGD: 2 small gastric AVMs, 1 actively bleeding.  Both treated with APC ablation, clipping of the bleeder    Tobacco Use: Social History   Tobacco Use  Smoking Status Former Smoker  . Packs/day: 2.50  . Years: 30.00  . Pack years: 75.00  . Types: Cigarettes  . Quit date: 06/11/1978  . Years since quitting: 40.8  Smokeless Tobacco Never Used    Labs: Recent ReviChemical engineerLabs for [facility] Cardiac and Pulmonary Rehab Latest Ref Rng & Units 05/18/2016 07/13/2016 07/14/2016 07/18/2016 06/24/2017   Cholestrol 0 - 200 mg/dL 134 - - - 132   LDLCALC 0 - 99 mg/dL - - - - -   LDLDIRECT mg/dL 65.0 - - - 69.0   HDL >39.00 mg/dL 42.80 - - - 44.80   Trlycerides 0.0 - 149.0 mg/dL 952.0 Triglyceride is over 400; calculations on Lipids are invalid.(H) - - - 1031.0 Triglyceride is over 400; calculations on Lipids are invalid.(H)   Hemoglobin A1c 4.8 - 5.6 % - - - 5.4 -   PHART 7.350 - 7.450 - - 7.403 - -   PCO2ART 32.0 - 48.0 mmHg - - 33.5 - -   HCO3 20.0 - 28.0 mmol/L - - 20.9 - -   TCO2 0 - 100 mmol/L - 24 22 - -   ACIDBASEDEF 0.0 - 2.0 mmol/L - - 3.0(H) - -   O2SAT % - - 98.0 - -       Pulmonary Assessment Scores:    UCSD: Self-administered rating of dyspnea associated with activities of daily living (ADLs) 6-point scale (0 = "not at all" to 5 = "maximal or unable to do because of breathlessness")  Scoring Scores range from 0 to 120.  Minimally important difference is 5 units  CAT: CAT can identify the health impairment of COPD patients and is better correlated with disease progression.  CAT has a scoring  range of zero to 40. The CAT score is classified into four groups of low (less than 10), medium (10 - 20), high (21-30) and very high (31-40) based on the impact level of disease on health status. A CAT score over 10 suggests significant symptoms.  A worsening CAT score could be explained by an exacerbation, poor medication adherence, poor inhaler technique, or progression of COPD or comorbid conditions.  CAT MCID is 2 points  mMRC: mMRC (Modified Medical Research Council) Dyspnea Scale is used to assess the degree of baseline functional disability in patients of respiratory disease due to dyspnea. No minimal important difference is established. A decrease in score of 1 point or greater is considered a positive change.   Pulmonary Function Assessment:   Exercise Target Goals: Exercise Program Goal: Individual exercise prescription set using results from initial 6 min walk test and THRR while considering  patient's activity barriers and safety.   Exercise Prescription Goal: Initial exercise prescription builds to 30-45 minutes a day of aerobic activity, 2-3 days per week.  Home exercise guidelines will be given to patient during program as part of exercise prescription that the participant will acknowledge.  Activity Barriers & Risk Stratification:   6 Minute Walk: 6 Minute Walk    Row Name 03/31/19 1354         6 Minute Walk   Phase  Initial     Distance  400 feet     Walk Time  4.35 minutes     # of Rest Breaks  1     MPH  0.75     METS  0.16     RPE  11     Perceived Dyspnea   2      VO2 Peak  0.57     Symptoms  No     Resting HR  66 bpm     Resting BP  132/64     Resting Oxygen Saturation   97 %     Exercise Oxygen Saturation  during 6 min walk  91 %     Max Ex. HR  87 bpm     Max Ex. BP  150/60     2 Minute Post BP  118/60       Interval HR   1 Minute HR  70     2 Minute HR  87     4 Minute HR  81     5 Minute HR  81     6 Minute HR  80     2 Minute Post HR  72     Interval Heart Rate?  Yes       Interval Oxygen   Interval Oxygen?  Yes     Baseline Oxygen Saturation %  97 %     1 Minute Oxygen Saturation %  90 %     1 Minute Liters of Oxygen  0 L     2 Minute Oxygen Saturation %  90 %     2 Minute Liters of Oxygen  0 L     3 Minute Liters of Oxygen  0 L     4 Minute Oxygen Saturation %  92 %     4 Minute Liters of Oxygen  0 L     5 Minute Oxygen Saturation %  91 %     5 Minute Liters of Oxygen  0 L     6 Minute Oxygen Saturation %  94 %  6 Minute Liters of Oxygen  0 L     2 Minute Post Oxygen Saturation %  94 %     2 Minute Post Liters of Oxygen  0 L       Oxygen Initial Assessment: Oxygen Initial Assessment - 03/26/19 1639      Home Oxygen   Home Oxygen Device  None    Sleep Oxygen Prescription  None    Home Exercise Oxygen Prescription  None    Home at Rest Exercise Oxygen Prescription  None      Initial 6 min Walk   Oxygen Used  None      Program Oxygen Prescription   Program Oxygen Prescription  None      Intervention   Short Term Goals  To learn and understand importance of monitoring SPO2 with pulse oximeter and demonstrate accurate use of the pulse oximeter.;To learn and understand importance of maintaining oxygen saturations>88%;To learn and demonstrate proper pursed lip breathing techniques or other breathing techniques.;To learn and demonstrate proper use of respiratory medications    Long  Term Goals  Verbalizes importance of monitoring SPO2 with pulse oximeter and return demonstration;Maintenance of O2  saturations>88%;Exhibits proper breathing techniques, such as pursed lip breathing or other method taught during program session;Compliance with respiratory medication;Demonstrates proper use of MDI's       Oxygen Re-Evaluation:   Oxygen Discharge (Final Oxygen Re-Evaluation):   Initial Exercise Prescription: Initial Exercise Prescription - 03/31/19 1300      Date of Initial Exercise RX and Referring Provider   Date  03/31/19    Referring Provider  [place]vak      Treadmill   MPH  0.5    Grade  0    Minutes  2    METs  0.5      Recumbant Bike   Level  1    RPM  50    Watts  15    Minutes  10    METs  1      NuStep   Level  1    SPM  80    Minutes  15    METs  1.5      REL-XR   Level  1    Watts  20    Speed  30    Minutes  10    METs  1.5      T5 Nustep   Level  1    SPM  50    Minutes  5    METs  1      Biostep-RELP   Level  1    SPM  50    Minutes  5    METs  1      Prescription Details   Duration  Progress to 30 minutes of continuous aerobic without signs/symptoms of physical distress      Intensity   Ratings of Perceived Exertion  11-15    Perceived Dyspnea  0-4      Progression   Progression  Continue progressive overload as per policy without signs/symptoms or physical distress.      Resistance Training   Training Prescription  Yes    Weight  3    Reps  10-15       Perform Capillary Blood Glucose checks as needed.  Exercise Prescription Changes: Exercise Prescription Changes    Row Name 04/08/19 1300 04/14/19 0900           Response to Exercise   Blood Pressure (  Admit)  116/52  -      Blood Pressure (Exercise)  140/64  -      Blood Pressure (Exit)  136/74  -      Heart Rate (Admit)  70 bpm  -      Heart Rate (Exercise)  88 bpm  -      Heart Rate (Exit)  67 bpm  -      Oxygen Saturation (Admit)  93 %  -      Oxygen Saturation (Exercise)  92 %  -      Oxygen Saturation (Exit)  95 %  -      Rating of Perceived Exertion  (Exercise)  13  -      Perceived Dyspnea (Exercise)  3  -      Symptoms  SOB  -      Duration  Continue with 30 min of aerobic exercise without signs/symptoms of physical distress.  -      Intensity  THRR unchanged  -        Progression   Progression  Continue to progress workloads to maintain intensity without signs/symptoms of physical distress.  -      Average METs  2.26  -        Resistance Training   Training Prescription  Yes  -      Weight  3 lbs  -      Reps  10-15  -        Interval Training   Interval Training  No  -        Recumbant Bike   Level  1  -      Watts  18  -      Minutes  15  -      METs  2.97  -        REL-XR   Level  1  -      Minutes  15  -        T5 Nustep   Level  1  -      Minutes  15  -      METs  1.8  -        Biostep-RELP   Level  1  -      Minutes  15  -      METs  2  -        Home Exercise Plan   Plans to continue exercise at  -  Home (comment) looking for a Nustep for home      Frequency  -  Add 1 additional day to program exercise sessions.      Initial Home Exercises Provided  -  04/14/19         Exercise Comments: Exercise Comments    Row Name 04/02/19 0902           Exercise Comments  First full day of exercise!  Patient was oriented to gym and equipment including functions, settings, policies, and procedures.  Patient's individual exercise prescription and treatment plan were reviewed.  All starting workloads were established based on the results of the 6 minute walk test done at initial orientation visit.  The plan for exercise progression was also introduced and progression will be customized based on patient's performance and goals.          Exercise Goals and Review: Exercise Goals    Row Name 03/26/19 1639 03/31/19 1400  Exercise Goals   Increase Physical Activity  Yes  Yes      Intervention  Provide advice, education, support and counseling about physical activity/exercise needs.;Develop an  individualized exercise prescription for aerobic and resistive training based on initial evaluation findings, risk stratification, comorbidities and participant's personal goals.  Provide advice, education, support and counseling about physical activity/exercise needs.;Develop an individualized exercise prescription for aerobic and resistive training based on initial evaluation findings, risk stratification, comorbidities and participant's personal goals.      Expected Outcomes  Short Term: Attend rehab on a regular basis to increase amount of physical activity.;Long Term: Add in home exercise to make exercise part of routine and to increase amount of physical activity.;Long Term: Exercising regularly at least 3-5 days a week.  Short Term: Attend rehab on a regular basis to increase amount of physical activity.;Long Term: Add in home exercise to make exercise part of routine and to increase amount of physical activity.;Long Term: Exercising regularly at least 3-5 days a week.      Increase Strength and Stamina  Yes  -      Intervention  Provide advice, education, support and counseling about physical activity/exercise needs.;Develop an individualized exercise prescription for aerobic and resistive training based on initial evaluation findings, risk stratification, comorbidities and participant's personal goals.  Provide advice, education, support and counseling about physical activity/exercise needs.;Develop an individualized exercise prescription for aerobic and resistive training based on initial evaluation findings, risk stratification, comorbidities and participant's personal goals.      Expected Outcomes  Short Term: Increase workloads from initial exercise prescription for resistance, speed, and METs.;Short Term: Perform resistance training exercises routinely during rehab and add in resistance training at home;Long Term: Improve cardiorespiratory fitness, muscular endurance and strength as measured by  increased METs and functional capacity (6MWT)  Short Term: Increase workloads from initial exercise prescription for resistance, speed, and METs.;Short Term: Perform resistance training exercises routinely during rehab and add in resistance training at home;Long Term: Improve cardiorespiratory fitness, muscular endurance and strength as measured by increased METs and functional capacity (6MWT)      Able to understand and use rate of perceived exertion (RPE) scale  Yes  Yes      Intervention  Provide education and explanation on how to use RPE scale  Provide education and explanation on how to use RPE scale      Expected Outcomes  Short Term: Able to use RPE daily in rehab to express subjective intensity level;Long Term:  Able to use RPE to guide intensity level when exercising independently  Short Term: Able to use RPE daily in rehab to express subjective intensity level;Long Term:  Able to use RPE to guide intensity level when exercising independently      Able to understand and use Dyspnea scale  Yes  Yes      Intervention  Provide education and explanation on how to use Dyspnea scale  Provide education and explanation on how to use Dyspnea scale      Expected Outcomes  Short Term: Able to use Dyspnea scale daily in rehab to express subjective sense of shortness of breath during exertion;Long Term: Able to use Dyspnea scale to guide intensity level when exercising independently  Short Term: Able to use Dyspnea scale daily in rehab to express subjective sense of shortness of breath during exertion;Long Term: Able to use Dyspnea scale to guide intensity level when exercising independently      Knowledge and understanding of Target Heart Rate Range (THRR)  Yes  Yes      Intervention  Provide education and explanation of THRR including how the numbers were predicted and where they are located for reference  Provide education and explanation of THRR including how the numbers were predicted and where they are  located for reference      Expected Outcomes  Short Term: Able to state/look up THRR;Short Term: Able to use daily as guideline for intensity in rehab;Long Term: Able to use THRR to govern intensity when exercising independently  Short Term: Able to state/look up THRR;Short Term: Able to use daily as guideline for intensity in rehab;Long Term: Able to use THRR to govern intensity when exercising independently      Able to check pulse independently  Yes  Yes      Intervention  Provide education and demonstration on how to check pulse in carotid and radial arteries.;Review the importance of being able to check your own pulse for safety during independent exercise  Provide education and demonstration on how to check pulse in carotid and radial arteries.;Review the importance of being able to check your own pulse for safety during independent exercise      Expected Outcomes  Short Term: Able to explain why pulse checking is important during independent exercise;Long Term: Able to check pulse independently and accurately  Short Term: Able to explain why pulse checking is important during independent exercise;Long Term: Able to check pulse independently and accurately      Understanding of Exercise Prescription  Yes  Yes      Intervention  Provide education, explanation, and written materials on patient's individual exercise prescription  Provide education, explanation, and written materials on patient's individual exercise prescription      Expected Outcomes  Short Term: Able to explain program exercise prescription;Long Term: Able to explain home exercise prescription to exercise independently  Short Term: Able to explain program exercise prescription;Long Term: Able to explain home exercise prescription to exercise independently         Exercise Goals Re-Evaluation : Exercise Goals Re-Evaluation    Row Name 04/02/19 0902 04/08/19 1326 04/14/19 0942         Exercise Goal Re-Evaluation   Exercise Goals  Review  Able to understand and use rate of perceived exertion (RPE) scale;Knowledge and understanding of Target Heart Rate Range (THRR);Understanding of Exercise Prescription  Increase Physical Activity;Increase Strength and Stamina;Understanding of Exercise Prescription  Increase Physical Activity;Increase Strength and Stamina;Able to understand and use rate of perceived exertion (RPE) scale;Knowledge and understanding of Target Heart Rate Range (THRR);Able to check pulse independently;Understanding of Exercise Prescription     Comments  Reviewed RPE scale, THR and program prescription with pt today.  Pt voiced understanding and was given a copy of goals to take home.  Eduardo [name] is off to a good start.  He has completed his first three days of exercise. He was able to get 2 METs on the BioStep. We will continue to monitor his progress.  Reviewed home exercise with pt today.  Pt plans to get a nustep for home for exercise.  Reviewed THR, pulse, RPE, sign and symptoms, NTG use, and when to call 911 or MD.  Also discussed weather considerations and indoor options.  Pt voiced understanding.     Expected Outcomes  hort: Use RPE daily to regulate intensity. Long: Follow program prescription in THR.  Short: Continue to attend regularly.  Long: Continue to follow program prescription.  Short - do strength work at home until he can  find nustep :Long - maintain exercise on his own        Discharge Exercise Prescription (Final Exercise Prescription Changes): Exercise Prescription Changes - 04/14/19 0900      Home Exercise Plan   Plans to continue exercise at  Home (comment)   looking for a Nustep for home   Frequency  Add 1 additional day to program exercise sessions.    Initial Home Exercises Provided  04/14/19       Nutrition:  Target Goals: Understanding of nutrition guidelines, daily intake of sodium '1500mg'$ , cholesterol '200mg'$ , calories 30% from fat and 7% or less from saturated fats, daily to have 5 or  more servings of fruits and vegetables.  Biometrics: Pre Biometrics - 03/31/19 1400      Pre Biometrics   Height  '5\' 2"'$  (1.575 m)    Weight  142 lb 1.6 oz (64.5 kg)    BMI (Calculated)  25.98        Nutrition Therapy Plan and Nutrition Goals:   Nutrition Assessments: Nutrition Assessments - 03/31/19 1410      MEDFICTS Scores   Pre Score  42       Nutrition Goals Re-Evaluation:   Nutrition Goals Discharge (Final Nutrition Goals Re-Evaluation):   Psychosocial: Target Goals: Acknowledge presence or absence of significant depression and/or stress, maximize coping skills, provide positive support system. Participant is able to verbalize types and ability to use techniques and skills needed for reducing stress and depression.   Initial Review & Psychosocial Screening: Initial Psych Review & Screening - 03/26/19 1640      Initial Review   Current issues with  Current Psychotropic Meds;Current Anxiety/Panic      Family Dynamics   Good Support System?  Yes    Comments  He can look to his wife, daughter and son for support. He talks with a therapist about his anxiety.      Barriers   Psychosocial barriers to participate in program  The patient should benefit from training in stress management and relaxation.      Screening Interventions   Interventions  Encouraged to exercise;To provide support and resources with identified psychosocial needs;Provide feedback about the scores to participant    Expected Outcomes  Short Term goal: Utilizing psychosocial counselor, staff and physician to assist with identification of specific Stressors or current issues interfering with healing process. Setting desired goal for each stressor or current issue identified.;Long Term Goal: Stressors or current issues are controlled or eliminated.;Long Term goal: The participant improves quality of Life and PHQ9 Scores as seen by post scores and/or verbalization of changes;Short Term goal:  Identification and review with participant of any Quality of Life or Depression concerns found by scoring the questionnaire.       Quality of Life Scores:  Scores of 19 and below usually indicate a poorer quality of life in these areas.  A difference of  2-3 points is a clinically meaningful difference.  A difference of 2-3 points in the total score of the Quality of Life Index has been associated with significant improvement in overall quality of life, self-image, physical symptoms, and general health in studies assessing change in quality of life.  PHQ-9: Recent Review Flowsheet Data    Depression screen [facility] 2/9 06/24/2017 05/29/2016   Decreased Interest 0 0   Down, Depressed, Hopeless 0 0   PHQ - 2 Score 0 0     Interpretation of Total Score  Total Score Depression Severity:  1-4 = Minimal depression,  5-9 = Mild depression, 10-14 = Moderate depression, 15-19 = Moderately severe depression, 20-27 = Severe depression   Psychosocial Evaluation and Intervention:   Psychosocial Re-Evaluation:   Psychosocial Discharge (Final Psychosocial Re-Evaluation):   Education: Education Goals: Education classes will be provided on a weekly basis, covering required topics. Participant will state understanding/return demonstration of topics presented.  Learning Barriers/Preferences: Learning Barriers/Preferences - 03/26/19 1642      Learning Barriers/Preferences   Learning Barriers  None    Learning Preferences  None       Education Topics:  Initial Evaluation Education: - Verbal, written and demonstration of respiratory meds, oximetry and breathing techniques. Instruction on use of nebulizers and MDIs and importance of monitoring MDI activations.   Pulmonary Rehab from 03/31/2019 in [facility] Cardiac and Pulmonary Rehab  Date  03/31/19  Educator  [place]  Instruction Review Code  1- Verbalizes Understanding      General Nutrition Guidelines/Fats and Fiber: -Group instruction provided by  verbal, written material, models and posters to present the general guidelines for heart healthy nutrition. Gives an explanation and review of dietary fats and fiber.   Controlling Sodium/Reading Food Labels: -Group verbal and written material supporting the discussion of sodium use in heart healthy nutrition. Review and explanation with models, verbal and written materials for utilization of the food label.   Exercise Physiology & General Exercise Guidelines: - Group verbal and written instruction with models to review the exercise physiology of the cardiovascular system and associated critical values. Provides general exercise guidelines with specific guidelines to those with heart or lung disease.    Aerobic Exercise & Resistance Training: - Gives group verbal and written instruction on the various components of exercise. Focuses on aerobic and resistive training programs and the benefits of this training and how to safely progress through these programs.   Cardiac Rehab from 04/02/2019 in [facility] Cardiac and Pulmonary Rehab  Date  04/02/19  Educator  [facility]  Instruction Review Code  1- Verbalizes Understanding      Flexibility, Balance, Mind/Body Relaxation: Provides group verbal/written instruction on the benefits of flexibility and balance training, including mind/body exercise modes such as yoga, pilates and tai chi.  Demonstration and skill practice provided.   Stress and Anxiety: - Provides group verbal and written instruction about the health risks of elevated stress and causes of high stress.  Discuss the correlation between heart/lung disease and anxiety and treatment options. Review healthy ways to manage with stress and anxiety.   Depression: - Provides group verbal and written instruction on the correlation between heart/lung disease and depressed mood, treatment options, and the stigmas associated with seeking treatment.   Exercise & Equipment Safety: - Individual verbal  instruction and demonstration of equipment use and safety with use of the equipment.   Infection Prevention: - Provides verbal and written material to individual with discussion of infection control including proper hand washing and proper equipment cleaning during exercise session.   Pulmonary Rehab from 03/31/2019 in [facility] Cardiac and Pulmonary Rehab  Date  03/31/19  Educator  [place]  Instruction Review Code  1- Verbalizes Understanding      Falls Prevention: - Provides verbal and written material to individual with discussion of falls prevention and safety.   Pulmonary Rehab from 03/31/2019 in [facility] Cardiac and Pulmonary Rehab  Date  03/31/19  Educator  [place]  Instruction Review Code  1- Verbalizes Understanding      Diabetes: - Individual verbal and written instruction to review signs/symptoms of diabetes, desired ranges  of glucose level fasting, after meals and with exercise. Advice that pre and post exercise glucose checks will be done for 3 sessions at entry of program.   Chronic Lung Diseases: - Group verbal and written instruction to review updates, respiratory medications, advancements in procedures and treatments. Discuss use of supplemental oxygen including available portable oxygen systems, continuous and intermittent flow rates, concentrators, personal use and safety guidelines. Review proper use of inhaler and spacers. Provide informative websites for self-education.    Energy Conservation: - Provide group verbal and written instruction for methods to conserve energy, plan and organize activities. Instruct on pacing techniques, use of adaptive equipment and posture/positioning to relieve shortness of breath.   Triggers and Exacerbations: - Group verbal and written instruction to review types of environmental triggers and ways to prevent exacerbations. Discuss weather changes, air quality and the benefits of nasal washing. Review warning signs and symptoms to help prevent  infections. Discuss techniques for effective airway clearance, coughing, and vibrations.   AED/CPR: - Group verbal and written instruction with the use of models to demonstrate the basic use of the AED with the basic ABC's of resuscitation.   Anatomy and Physiology of the Lungs: - Group verbal and written instruction with the use of models to provide basic lung anatomy and physiology related to function, structure and complications of lung disease.   Anatomy & Physiology of the Heart: - Group verbal and written instruction and models provide basic cardiac anatomy and physiology, with the coronary electrical and arterial systems. Review of Valvular disease and Heart Failure   Cardiac Medications: - Group verbal and written instruction to review commonly prescribed medications for heart disease. Reviews the medication, class of the drug, and side effects.   Know Your Numbers and Risk Factors: -Group verbal and written instruction about important numbers in your health.  Discussion of what are risk factors and how they play a role in the disease process.  Review of Cholesterol, Blood Pressure, Diabetes, and BMI and the role they play in your overall health.   Cardiac Rehab from 04/02/2019 in [facility] Cardiac and Pulmonary Rehab  Date  04/02/19  Educator  [facility]  Instruction Review Code  1- Verbalizes Understanding      Sleep Hygiene: -Provides group verbal and written instruction about how sleep can affect your health.  Define sleep hygiene, discuss sleep cycles and impact of sleep habits. Review good sleep hygiene tips.    Other: -Provides group and verbal instruction on various topics (see comments)    Knowledge Questionnaire Score:    Core Components/Risk Factors/Patient Goals at Admission: Personal Goals and Risk Factors at Admission - 03/26/19 1642      Core Components/Risk Factors/Patient Goals on Admission    Weight Management  Weight Loss;Yes    Intervention  Weight  Management: Develop a combined nutrition and exercise program designed to reach desired caloric intake, while maintaining appropriate intake of nutrient and fiber, sodium and fats, and appropriate energy expenditure required for the weight goal.;Weight Management: Provide education and appropriate resources to help participant work on and attain dietary goals.    Expected Outcomes  Short Term: Continue to assess and modify interventions until short term weight is achieved;Long Term: Adherence to nutrition and physical activity/exercise program aimed toward attainment of established weight goal;Weight Loss: Understanding of general recommendations for a balanced deficit meal plan, which promotes 1-2 lb weight loss per week and includes a negative energy balance of [phone] kcal/d;Understanding of distribution of calorie intake throughout the day  with the consumption of 4-5 meals/snacks    Improve shortness of breath with ADL's  Yes    Intervention  Provide education, individualized exercise plan and daily activity instruction to help decrease symptoms of SOB with activities of daily living.    Expected Outcomes  Short Term: Improve cardiorespiratory fitness to achieve a reduction of symptoms when performing ADLs;Long Term: Be able to perform more ADLs without symptoms or delay the onset of symptoms    Heart Failure  Yes    Intervention  Provide a combined exercise and nutrition program that is supplemented with education, support and counseling about heart failure. Directed toward relieving symptoms such as shortness of breath, decreased exercise tolerance, and extremity edema.    Expected Outcomes  Improve functional capacity of life;Short term: Attendance in program 2-3 days a week with increased exercise capacity. Reported lower sodium intake. Reported increased fruit and vegetable intake. Reports medication compliance.;Short term: Daily weights obtained and reported for increase. Utilizing diuretic  protocols set by physician.;Long term: Adoption of self-care skills and reduction of barriers for early signs and symptoms recognition and intervention leading to self-care maintenance.    Hypertension  Yes    Intervention  Provide education on lifestyle modifcations including regular physical activity/exercise, weight management, moderate sodium restriction and increased consumption of fresh fruit, vegetables, and low fat dairy, alcohol moderation, and smoking cessation.;Monitor prescription use compliance.    Expected Outcomes  Short Term: Continued assessment and intervention until BP is < 140/39m HG in hypertensive participants. < 130/880mHG in hypertensive participants with diabetes, heart failure or chronic kidney disease.;Long Term: Maintenance of blood pressure at goal levels.    Lipids  Yes    Intervention  Provide education and support for participant on nutrition & aerobic/resistive exercise along with prescribed medications to achieve LDL '70mg'$ , HDL >'40mg'$ .    Expected Outcomes  Short Term: Participant states understanding of desired cholesterol values and is compliant with medications prescribed. Participant is following exercise prescription and nutrition guidelines.;Long Term: Cholesterol controlled with medications as prescribed, with individualized exercise RX and with personalized nutrition plan. Value goals: LDL < '70mg'$ , HDL > 40 mg.       Core Components/Risk Factors/Patient Goals Review:    Core Components/Risk Factors/Patient Goals at Discharge (Final Review):    ITP Comments: ITP Comments    Row Name 03/26/19 1658 04/02/19 0902 04/15/19 0701       ITP Comments  Virtual Orientation complete. Visit diagnosis can be found in [facility]. RD and EP evaluation date on 03/31/2019 0930.  First full day of exercise!  Patient was oriented to gym and equipment including functions, settings, policies, and procedures.  Patient's individual exercise prescription and treatment plan were  reviewed.  All starting workloads were established based on the results of the 6 minute walk test done at initial orientation visit.  The plan for exercise progression was also introduced and progression will be customized based on patient's performance and goals.  30 day review completed. Continue with ITP sent to Dr. MaEmily FilbertMedical Director of Cardiac and Pulmonary Rehab for review , changes as needed and signature.   New to program        Comments:

## 2019-04-16 ENCOUNTER — Other Ambulatory Visit: Payer: Self-pay

## 2019-04-16 ENCOUNTER — Encounter: Payer: PPO | Admitting: *Deleted

## 2019-04-16 DIAGNOSIS — I5030 Unspecified diastolic (congestive) heart failure: Secondary | ICD-10-CM | POA: Diagnosis not present

## 2019-04-16 DIAGNOSIS — I5032 Chronic diastolic (congestive) heart failure: Secondary | ICD-10-CM

## 2019-04-16 NOTE — Progress Notes (Signed)
Daily Session Note  Patient Details  Name: DEOVION BATREZ MRN: [id number] Date of Birth: [birth year] Referring Provider:     Pulmonary Rehab from 03/31/2019 in [facility] Cardiac and Pulmonary Rehab  Referring Provider  [name]      Encounter Date: 04/16/2019  Check In: Session Check In - 04/16/19 0845      Check-In   Supervising physician immediately available to respond to emergencies  See telemetry face sheet for immediately available ER MD    Location  [facility]-Cardiac & Pulmonary Rehab    Staff Present  Heath Lark, RN, BSN, CCRP;Amanda Sommer, BA, ACSM CEP, Exercise Physiologist;Jeanna Durrell BS, Exercise Physiologist    Virtual Visit  No    Medication changes reported      No    Fall or balance concerns reported     No    Warm-up and Cool-down  Performed on first and last piece of equipment    Resistance Training Performed  Yes    VAD Patient?  No    PAD/SET Patient?  No      Pain Assessment   Currently in Pain?  No/denies          Social History   Tobacco Use  Smoking Status Former Smoker  . Packs/day: 2.50  . Years: 30.00  . Pack years: 75.00  . Types: Cigarettes  . Quit date: 06/11/1978  . Years since quitting: 40.8  Smokeless Tobacco Never Used    Goals Met:  Independence with exercise equipment Exercise tolerated well No report of cardiac concerns or symptoms  Goals Unmet:  Not Applicable  Comments: Pt able to follow exercise prescription today without complaint.  Will continue to monitor for progression.    Dr. Emily Filbert is Medical Director for [place] and [name].

## 2019-04-21 ENCOUNTER — Encounter: Payer: PPO | Admitting: *Deleted

## 2019-04-21 ENCOUNTER — Other Ambulatory Visit: Payer: Self-pay

## 2019-04-21 DIAGNOSIS — I5030 Unspecified diastolic (congestive) heart failure: Secondary | ICD-10-CM | POA: Diagnosis not present

## 2019-04-21 DIAGNOSIS — I5032 Chronic diastolic (congestive) heart failure: Secondary | ICD-10-CM

## 2019-04-21 NOTE — Progress Notes (Signed)
Daily Session Note  Patient Details  Name: CROIX PRESLEY MRN: [id number] Date of Birth: 11/18/1930 Referring Provider:     Pulmonary Rehab from 03/31/2019 in [facility] Cardiac and Pulmonary Rehab  Referring Provider  [name]      Encounter Date: 04/21/2019  Check In: Session Check In - 04/21/19 0828      Check-In   Supervising physician immediately available to respond to emergencies  See telemetry face sheet for immediately available ER MD    Location  [facility]-Cardiac & Pulmonary Rehab    Staff Present  Heath Lark, RN, BSN, CCRP;Jeanna Durrell BS, Exercise Physiologist;Joseph Hood RCP,RRT,BSRT    Virtual Visit  No    Medication changes reported      No    Fall or balance concerns reported     No    Warm-up and Cool-down  Performed on first and last piece of equipment    Resistance Training Performed  Yes    VAD Patient?  No    PAD/SET Patient?  No      Pain Assessment   Currently in Pain?  No/denies          Social History   Tobacco Use  Smoking Status Former Smoker  . Packs/day: 2.50  . Years: 30.00  . Pack years: 75.00  . Types: Cigarettes  . Quit date: 06/11/1978  . Years since quitting: 40.8  Smokeless Tobacco Never Used    Goals Met:  Independence with exercise equipment Exercise tolerated well No report of cardiac concerns or symptoms  Goals Unmet:  Not Applicable  Comments: Pt able to follow exercise prescription today without complaint.  Will continue to monitor for progression.    Dr. Emily Filbert is Medical Director for [place] and [name].

## 2019-04-23 ENCOUNTER — Other Ambulatory Visit: Payer: Self-pay

## 2019-04-23 ENCOUNTER — Encounter: Payer: PPO | Admitting: *Deleted

## 2019-04-23 DIAGNOSIS — I5032 Chronic diastolic (congestive) heart failure: Secondary | ICD-10-CM

## 2019-04-23 DIAGNOSIS — I5030 Unspecified diastolic (congestive) heart failure: Secondary | ICD-10-CM | POA: Diagnosis not present

## 2019-04-23 NOTE — Progress Notes (Signed)
Daily Session Note  Patient Details  Name: QUINCEY QUESINBERRY MRN: [id number] Date of Birth: 02-10-1931 Referring Provider:     Pulmonary Rehab from 03/31/2019 in [facility] Cardiac and Pulmonary Rehab  Referring Provider  [name]      Encounter Date: 04/23/2019  Check In: Session Check In - 04/23/19 0845      Check-In   Supervising physician immediately available to respond to emergencies  See telemetry face sheet for immediately available ER MD    Location  [facility]-Cardiac & Pulmonary Rehab    Staff Present  Heath Lark, RN, BSN, CCRP;Jeanna Durrell BS, Exercise Physiologist;Jessica Elbing, MA, RCEP, CCRP, CCET    Virtual Visit  No    Medication changes reported      No    Fall or balance concerns reported     No    Warm-up and Cool-down  Performed on first and last piece of equipment    Resistance Training Performed  Yes    VAD Patient?  No    PAD/SET Patient?  No      Pain Assessment   Currently in Pain?  No/denies          Social History   Tobacco Use  Smoking Status Former Smoker  . Packs/day: 2.50  . Years: 30.00  . Pack years: 75.00  . Types: Cigarettes  . Quit date: 06/11/1978  . Years since quitting: 40.8  Smokeless Tobacco Never Used    Goals Met:  Independence with exercise equipment Exercise tolerated well No report of cardiac concerns or symptoms  Goals Unmet:  Not Applicable  Comments: Pt able to follow exercise prescription today without complaint.  Will continue to monitor for progression.    Dr. Emily Filbert is Medical Director for [place] and [name].

## 2019-04-24 ENCOUNTER — Encounter: Payer: PPO | Admitting: *Deleted

## 2019-04-24 DIAGNOSIS — I5032 Chronic diastolic (congestive) heart failure: Secondary | ICD-10-CM

## 2019-04-24 DIAGNOSIS — I5030 Unspecified diastolic (congestive) heart failure: Secondary | ICD-10-CM | POA: Diagnosis not present

## 2019-04-24 NOTE — Progress Notes (Signed)
Daily Session Note  Patient Details  Name: Eduardo [name] MRN: [id number] Date of Birth: 1931-03-22 Referring Provider:     Pulmonary Rehab from 03/31/2019 in [facility] Cardiac and Pulmonary Rehab  Referring Provider  [name]      Encounter Date: 04/24/2019  Check In: Session Check In - 04/24/19 1051      Check-In   Supervising physician immediately available to respond to emergencies  See telemetry face sheet for immediately available ER MD    Location  [facility]-Cardiac & Pulmonary Rehab    Staff Present  Renita Papa, RN BSN;Jessica Luan Pulling, MA, RCEP, CCRP, CCET;Joseph Zachary RCP,RRT,BSRT    Virtual Visit  No    Medication changes reported      No    Fall or balance concerns reported     No    Warm-up and Cool-down  Performed on first and last piece of equipment    Resistance Training Performed  Yes    VAD Patient?  No    PAD/SET Patient?  No      Pain Assessment   Currently in Pain?  No/denies          Social History   Tobacco Use  Smoking Status Former Smoker  . Packs/day: 2.50  . Years: 30.00  . Pack years: 75.00  . Types: Cigarettes  . Quit date: 06/11/1978  . Years since quitting: 40.8  Smokeless Tobacco Never Used    Goals Met:  Independence with exercise equipment Exercise tolerated well No report of cardiac concerns or symptoms Strength training completed today  Goals Unmet:  Not Applicable  Comments: Pt able to follow exercise prescription today without complaint.  Will continue to monitor for progression.    Dr. Emily Filbert is Medical Director for [place] and [name].

## 2019-04-27 ENCOUNTER — Other Ambulatory Visit: Payer: Self-pay

## 2019-04-27 DIAGNOSIS — I5032 Chronic diastolic (congestive) heart failure: Secondary | ICD-10-CM

## 2019-04-27 NOTE — Progress Notes (Signed)
RD eval deferred at this time - will continue to check in

## 2019-04-28 ENCOUNTER — Encounter: Payer: PPO | Admitting: *Deleted

## 2019-04-28 ENCOUNTER — Other Ambulatory Visit: Payer: Self-pay

## 2019-04-28 DIAGNOSIS — I5030 Unspecified diastolic (congestive) heart failure: Secondary | ICD-10-CM | POA: Diagnosis not present

## 2019-04-28 DIAGNOSIS — I5032 Chronic diastolic (congestive) heart failure: Secondary | ICD-10-CM

## 2019-04-28 NOTE — Progress Notes (Signed)
Daily Session Note  Patient Details  Name: Eduardo [name] MRN: [id number] Date of Birth: 1931-03-08 Referring Provider:     Pulmonary Rehab from 03/31/2019 in [facility] Cardiac and Pulmonary Rehab  Referring Provider  [name]      Encounter Date: 04/28/2019  Check In: Session Check In - 04/28/19 0839      Check-In   Supervising physician immediately available to respond to emergencies  See telemetry face sheet for immediately available ER MD    Location  [facility]-Cardiac & Pulmonary Rehab    Staff Present  Heath Lark, RN, BSN, CCRP;Amanda Sommer, BA, ACSM CEP, Exercise Physiologist;Jessica [place], MA, RCEP, CCRP, CCET    Virtual Visit  No    Medication changes reported      No    Fall or balance concerns reported     No    Warm-up and Cool-down  Performed on first and last piece of equipment    Resistance Training Performed  Yes    VAD Patient?  No    PAD/SET Patient?  No      Pain Assessment   Currently in Pain?  No/denies          Social History   Tobacco Use  Smoking Status Former Smoker  . Packs/day: 2.50  . Years: 30.00  . Pack years: 75.00  . Types: Cigarettes  . Quit date: 06/11/1978  . Years since quitting: 40.9  Smokeless Tobacco Never Used    Goals Met:  Independence with exercise equipment Exercise tolerated well No report of cardiac concerns or symptoms  Goals Unmet:  Not Applicable  Comments: Pt able to follow exercise prescription today without complaint.  Will continue to monitor for progression.    Dr. Emily Filbert is Medical Director for [place] and [name].

## 2019-04-29 MED ORDER — ATORVASTATIN CALCIUM 80 MG PO TABS: 80.0000 mg | ORAL_TABLET | Freq: Every day | ORAL | 1 refills | Status: DC

## 2019-04-30 ENCOUNTER — Other Ambulatory Visit: Payer: Self-pay

## 2019-04-30 ENCOUNTER — Encounter: Payer: PPO | Admitting: *Deleted

## 2019-04-30 DIAGNOSIS — I5030 Unspecified diastolic (congestive) heart failure: Secondary | ICD-10-CM | POA: Diagnosis not present

## 2019-04-30 DIAGNOSIS — I5032 Chronic diastolic (congestive) heart failure: Secondary | ICD-10-CM

## 2019-04-30 NOTE — Progress Notes (Signed)
Daily Session Note  Patient Details  Name: Eduardo [name] MRN: [id number] Date of Birth: 1930/12/06 Referring Provider:     Pulmonary Rehab from 03/31/2019 in [facility] Cardiac and Pulmonary Rehab  Referring Provider  [name]      Encounter Date: 04/30/2019  Check In: Session Check In - 04/30/19 0839      Check-In   Supervising physician immediately available to respond to emergencies  See telemetry face sheet for immediately available ER MD    Location  [facility]-Cardiac & Pulmonary Rehab    Staff Present  Heath Lark, RN, BSN, CCRP;Laureen Owens Shark, BS, RRT, CPFT;Jeanna Durrell BS, Exercise Physiologist    Virtual Visit  No    Medication changes reported      No    Fall or balance concerns reported     No    Warm-up and Cool-down  Performed on first and last piece of equipment    Resistance Training Performed  Yes    VAD Patient?  No    PAD/SET Patient?  No      Pain Assessment   Currently in Pain?  No/denies          Social History   Tobacco Use  Smoking Status Former Smoker  . Packs/day: 2.50  . Years: 30.00  . Pack years: 75.00  . Types: Cigarettes  . Quit date: 06/11/1978  . Years since quitting: 40.9  Smokeless Tobacco Never Used    Goals Met:  Proper associated with RPD/PD & O2 Sat Independence with exercise equipment Exercise tolerated well No report of cardiac concerns or symptoms  Goals Unmet:  Not Applicable  Comments: Pt able to follow exercise prescription today without complaint.  Will continue to monitor for progression.    Dr. Emily Filbert is Medical Director for [place] and [name].

## 2019-05-01 ENCOUNTER — Encounter: Payer: PPO | Admitting: *Deleted

## 2019-05-01 DIAGNOSIS — I5030 Unspecified diastolic (congestive) heart failure: Secondary | ICD-10-CM | POA: Diagnosis not present

## 2019-05-01 DIAGNOSIS — I5032 Chronic diastolic (congestive) heart failure: Secondary | ICD-10-CM

## 2019-05-01 NOTE — Progress Notes (Signed)
Daily Session Note  Patient Details  Name: Eduardo [name] MRN: [id number] Date of Birth: 1931/05/12 Referring Provider:     Pulmonary Rehab from 03/31/2019 in [facility] Cardiac and Pulmonary Rehab  Referring Provider  [name]      Encounter Date: 05/01/2019  Check In: Session Check In - 05/01/19 1137      Check-In   Supervising physician immediately available to respond to emergencies  See telemetry face sheet for immediately available ER MD    Location  [facility]-Cardiac & Pulmonary Rehab    Staff Present  Renita Papa, RN Vickki Hearing, BA, ACSM CEP, Exercise Physiologist    Virtual Visit  No    Medication changes reported      No    Fall or balance concerns reported     No    Warm-up and Cool-down  Performed on first and last piece of equipment    Resistance Training Performed  Yes    VAD Patient?  No    PAD/SET Patient?  No      Pain Assessment   Currently in Pain?  No/denies          Social History   Tobacco Use  Smoking Status Former Smoker  . Packs/day: 2.50  . Years: 30.00  . Pack years: 75.00  . Types: Cigarettes  . Quit date: 06/11/1978  . Years since quitting: 40.9  Smokeless Tobacco Never Used    Goals Met:  Independence with exercise equipment Exercise tolerated well No report of cardiac concerns or symptoms Strength training completed today  Goals Unmet:  Not Applicable  Comments: Pt able to follow exercise prescription today without complaint.  Will continue to monitor for progression.    Dr. Emily Filbert is Medical Director for [place] and [name].

## 2019-05-05 ENCOUNTER — Encounter: Payer: Self-pay | Admitting: Family Medicine

## 2019-05-05 ENCOUNTER — Encounter: Payer: PPO | Admitting: *Deleted

## 2019-05-05 ENCOUNTER — Ambulatory Visit (INDEPENDENT_AMBULATORY_CARE_PROVIDER_SITE_OTHER): Payer: PPO | Admitting: Family Medicine

## 2019-05-05 ENCOUNTER — Other Ambulatory Visit: Payer: Self-pay

## 2019-05-05 VITALS — BP 140/82 | HR 72 | Temp 98.2°F | Ht 62.0 in | Wt 141.0 lb

## 2019-05-05 DIAGNOSIS — I5030 Unspecified diastolic (congestive) heart failure: Secondary | ICD-10-CM | POA: Diagnosis not present

## 2019-05-05 DIAGNOSIS — D170 Benign lipomatous neoplasm of skin and subcutaneous tissue of head, face and neck: Secondary | ICD-10-CM

## 2019-05-05 DIAGNOSIS — I5032 Chronic diastolic (congestive) heart failure: Secondary | ICD-10-CM

## 2019-05-05 NOTE — Patient Instructions (Signed)
Likely either a Sebaceous cyst or Lipoma  Watch and wait for growth or redness or fever and chills

## 2019-05-05 NOTE — Progress Notes (Signed)
   Subjective:     Eduardo [name] is a 85 y.o. 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   Skin:       lump     Social History   Tobacco Use  Smoking Status Former Smoker  . Packs/day: 2.50  . Years: 30.00  . Pack years: 75.00  . Types: Cigarettes  . Quit date: 06/11/1978  . Years since quitting: 40.9  Smokeless Tobacco Never Used        Objective:    BP Readings from Last 3 Encounters:  05/05/19 140/82  03/19/19 140/70  01/05/19 132/66   Wt Readings from Last 3 Encounters:  05/05/19 141 lb (64 kg)  03/31/19 142 lb 1.6 oz (64.5 kg)  03/19/19 147 lb 12 oz (67 kg)    BP 140/82   Pulse 72   Temp 98.2 F (36.8 C)   Ht 5\' 2"  (1.575 m)   Wt 141 lb (64 kg)   SpO2 97%   BMI 25.79 kg/m    Physical Exam Constitutional:      Appearance: Normal appearance. He is not ill-appearing or diaphoretic.  HENT:     Right Ear: External ear normal.     Left Ear: External ear normal.     Nose: Nose normal.  Eyes:     General: No scleral icterus.    Extraocular Movements: Extraocular movements intact.     Conjunctiva/sclera: Conjunctivae normal.  Neck:     Musculoskeletal: Neck supple.  Cardiovascular:     Rate and Rhythm: Normal rate.  Pulmonary:     Effort: Pulmonary effort is normal.  Skin:    General: Skin is warm and dry.     Comments: Right posterior neck: soft, mobile subcutaneous mass approximately 1.5 inch in diameter. No erythema  Neurological:     Mental Status: He is alert. Mental status is at baseline.  Psychiatric:        Mood and Affect: Mood normal.           Assessment & Plan:   Problem List  Items Addressed This Visit    None    Visit Diagnoses    Lipoma of neck    -  Primary     Suspect lipoma or sebaceous cyst.   Advised watch and wait. Return if redness or signs of infection or growing rapidly. Given location may need surgical removal.   This visit occurred during the SARS-CoV-2 public health emergency.  Safety protocols were in place, including screening questions prior to the visit, additional usage of staff PPE, and extensive cleaning of exam room while observing appropriate contact time as indicated for disinfecting solutions.     Return if symptoms worsen or fail to improve.  Lesleigh Noe, MD

## 2019-05-05 NOTE — Progress Notes (Signed)
Daily Session Note  Patient Details  Name: NORA ROOKE MRN: [id number] Date of Birth: 09/08/1930 Referring Provider:     Pulmonary Rehab from 03/31/2019 in [facility]  Referring Provider  [name]      Encounter Date: 05/05/2019  Check In: Session Check In - 05/05/19 0831      Check-In   Supervising physician immediately available to respond to emergencies  See telemetry face sheet for immediately available ER MD    Location  [facility]-Cardiac & Pulmonary Rehab    Staff Present  Heath Lark, RN, BSN, CCRP;Jeanna Durrell BS, Exercise Physiologist;Amanda Oletta Darter, BA, ACSM CEP, Exercise Physiologist    Virtual Visit  No    Medication changes reported      No    Fall or balance concerns reported     No    Warm-up and Cool-down  Performed on first and last piece of equipment    Resistance Training Performed  Yes    VAD Patient?  No    PAD/SET Patient?  No      Pain Assessment   Currently in Pain?  No/denies          Social History   Tobacco Use  Smoking Status Former Smoker  . Packs/day: 2.50  . Years: 30.00  . Pack years: 75.00  . Types: Cigarettes  . Quit date: 06/11/1978  . Years since quitting: 40.9  Smokeless Tobacco Never Used    Goals Met:  Proper associated with RPD/PD & O2 Sat Independence with exercise equipment Exercise tolerated well No report of cardiac concerns or symptoms  Goals Unmet:  Not Applicable  Comments: Pt able to follow exercise prescription today without complaint.  Will continue to monitor for progression.    Dr. Emily Filbert is Medical Director for [name].

## 2019-05-12 ENCOUNTER — Encounter: Payer: PPO | Attending: Internal Medicine | Admitting: *Deleted

## 2019-05-12 ENCOUNTER — Other Ambulatory Visit: Payer: Self-pay

## 2019-05-12 DIAGNOSIS — I5030 Unspecified diastolic (congestive) heart failure: Secondary | ICD-10-CM | POA: Diagnosis not present

## 2019-05-12 DIAGNOSIS — I5032 Chronic diastolic (congestive) heart failure: Secondary | ICD-10-CM

## 2019-05-12 NOTE — Progress Notes (Signed)
Daily Session Note  Patient Details  Name: JURRELL ROYSTER MRN: [id number] Date of Birth: June 21, 1930 Referring Provider:     Pulmonary Rehab from 03/31/2019 in [facility]  Referring Provider  [place]      Encounter Date: 05/12/2019  Check In: Session Check In - 05/12/19 0853      Check-In   Supervising physician immediately available to respond to emergencies  See telemetry face sheet for immediately available ER MD    Location  [facility] Pul[facility]    Staff Present  Heath Lark, RN, BSN, CCRP;Jessica Ladora, MA, RCEP, CCRP, CCET;Joseph [place] RCP,RRT,BSRT    Virtual Visit  No    Medication changes reported      No    Fall or balance concerns reported     No    Warm-up and Cool-down  Performed on first and last piece of equipment    Resistance Training Performed  Yes    VAD Patient?  No    PAD/SET Patient?  No      Pain Assessment   Currently in Pain?  No/denies          Social History   Tobacco Use  Smoking Status Former Smoker  . Packs/day: 2.50  . Years: 30.00  . Pack years: 75.00  . Types: Cigarettes  . Quit date: 06/11/1978  . Years since quitting: 40.9  Smokeless Tobacco Never Used    Goals Met:  Independence with exercise equipment Exercise tolerated well No report of cardiac concerns or symptoms  Goals Unmet:  Not Applicable  Comments: Pt able to follow exercise prescription today without complaint.  Will continue to monitor for progression.    Dr. Emily Filbert is Medical Director for [name].

## 2019-05-13 ENCOUNTER — Encounter: Payer: Self-pay | Admitting: *Deleted

## 2019-05-13 DIAGNOSIS — I5032 Chronic diastolic (congestive) heart failure: Secondary | ICD-10-CM

## 2019-05-13 NOTE — Progress Notes (Signed)
Pulmonary Individual Treatment Plan  Patient Details  Name: Eduardo [name] MRN: [id number] Date of Birth: [birth year] Referring Provider:     Pulmonary Rehab from 03/31/2019 in [facility] Cardiac and Pulmonary Rehab  Referring Provider  [place]      Initial Encounter Date:    Pulmonary Rehab from 03/31/2019 in [facility] Cardiac and Pulmonary Rehab  Date  03/31/19      Visit Diagnosis: Heart failure, diastolic, chronic ([place])  Patient's Home Medications on Admission:  Current Outpatient Medications:  .  acetaminophen (TYLENOL) 650 MG CR tablet, Take 1,300 mg by mouth 2 (two) times daily., Disp: , Rfl:  .  albuterol (PROVENTIL) (2.5 MG/3ML) 0.083% nebulizer solution, Use 1 vial in nebulizer every 4 hours as needed for wheezing or shortness of breath, Disp: 270 mL, Rfl: 2 .  ALPRAZolam (XANAX) 0.25 MG tablet, Take 1 tablet (0.25 mg total) by mouth 2 (two) times daily as needed for anxiety., Disp: 20 tablet, Rfl: 0 .  apixaban (ELIQUIS) 5 MG TABS tablet, Take 1 tablet (5 mg total) by mouth 2 (two) times daily., Disp: 180 tablet, Rfl: 3 .  atorvastatin (LIPITOR) 80 MG tablet, Take 1 tablet (80 mg total) by mouth daily., Disp: 90 tablet, Rfl: 1 .  B Complex-C (SUPER B COMPLEX PO), Take 1 tablet by mouth at bedtime. , Disp: , Rfl:  .  cetirizine (ZYRTEC) 10 MG tablet, Take 10 mg by mouth at bedtime. , Disp: , Rfl:  .  cholecalciferol (VITAMIN D) 1000 units tablet, Take 1,000 Units by mouth daily., Disp: , Rfl:  .  escitalopram (LEXAPRO) 20 MG tablet, Take 20 mg by mouth at bedtime. , Disp: , Rfl:  .  fenofibrate 160 MG tablet, TAKE ONE TABLET BY MOUTH DAILY AT BEDTIME , Disp: 90 tablet, Rfl: 3 .  furosemide (LASIX) 40 MG tablet, Take 1 tablet (40 mg total) by mouth daily., Disp: 90 tablet, Rfl: 3 .  hydrALAZINE (APRESOLINE) 10 MG tablet, Take 1 tablet (10 mg total) by mouth 3 (three) times daily., Disp: 270 tablet, Rfl: 3 .  ipratropium (ATROVENT) 0.03 % nasal spray, PLACE 2 SPRAYS INTO BOTH  NOSTRILS AT BEDTIME, Disp: 30 mL, Rfl: 2 .  lamoTRIgine (LAMICTAL) 150 MG tablet, Take 150 mg by mouth at bedtime. Reported on 08/14/2015, Disp: , Rfl:  .  montelukast (SINGULAIR) 10 MG tablet, TAKE ONE TABLET BY MOUTH DAILY AT BEDTIME , Disp: 90 tablet, Rfl: 0 .  Multiple Vitamin (MULTIVITAMIN) tablet, Take 1 tablet by mouth at bedtime. , Disp: , Rfl:  .  nitroGLYCERIN (NITROSTAT) 0.4 MG SL tablet, Place 1 tablet (0.4 mg total) under the tongue every 5 (five) minutes as needed for chest pain., Disp: 90 tablet, Rfl: 3 .  pantoprazole (PROTONIX) 40 MG tablet, TAKE 1 TABLET BY MOUTH DAILY BEFORE BREAKFAST., Disp: 90 tablet, Rfl: 3 .  Tiotropium Bromide-Olodaterol (STIOLTO RESPIMAT) 2.5-2.5 MCG/ACT AERS, Inhale 2 puffs into the lungs daily., Disp: 3 Inhaler, Rfl: 1 .  triamcinolone cream (KENALOG) 0.1 %, Apply 1 application topically 2 (two) times daily as needed (for skin). , Disp: , Rfl:  .  verapamil (CALAN-SR) 240 MG CR tablet, TAKE ONE TABLET BY MOUTH AT BEDTIME, Disp: 90 tablet, Rfl: 0  Past Medical History: Past Medical History:  Diagnosis Date  . Allergy   . Anemia 2012, 08/2015   chronic. Acute due to large hematoma 2013. 2017: due to gastric AVM bleeding.   . Anxiety   . CAD (coronary artery disease)    a.  06/2010 Cath: LM nl, LAD 50p/m, LCX 72m RCA 50p/m -->catheter dissection but good flow, 70d, RPDA 50-70;  b. 06/2010 Relook cath due to bradycardia and inf ST elev: RCA dissection @ ostium extending into coronary cusp and prox RCA, Ao dissection-->less staining than previously-->Med Rx; c. 08/2018 MV: No ischemia.  . Chronic combined systolic (congestive) and diastolic (congestive) heart failure ([place]    a. 12/2014 Echo: EF 60-65%; b. 07/2016 Echo: EF 45-50%, mild LVH. Mild AS/MS. Mod-sev MR. Mild to mod TR. PASP 567mg; c. 08/2018 Echo: EF 55-60%, RVSP 68.6m65m. Sev dil LA, mod dil RA. at least mod MR, mild to mod TR. Mild AS.  . CMarland KitchenPD (chronic obstructive pulmonary disease) ([id number]    exertional dyspnea  . Gastric angiodysplasia with hemorrhage 08/2015  . Hx of colonic polyps 2001, 2011   adenomatous 2001. HP 2001, 2011.  . HMarland Kitchenperlipidemia   . Mitral regurgitation    a. 07/2016 Echo: Mod to sev MR; b. 08/2018 Echo: at least moderate MR.  . Myocardial infarction (HCC[place]/2012.  12/2011.   "twice; 1 day apart; during/after cath". NQMI in setting severe anemia 2013.   . NMarland Kitchenuropathy 2014   in feet, likely due to spinal stenosis, DDD, HNP  . Osteoarthritis 2002   spinal stenosis, spondylolisthesis, disc protrusion multilevel in lumbar spine  . Permanent atrial fibrillation ([place]CC[place]012   a.  CHA2DS2VASc = 6-->eliquis;  b. 12/2015 Echo: EF 60-65%, no rwma, LVH, mild AS/MS/MR, sev dil LA, mild TR, PASP 28m62m  . Stroke (HCC)[place]16  . Upper GI bleed 08/2015   EGD: 2 small gastric AVMs, 1 actively bleeding.  Both treated with APC ablation, clipping of the bleeder    Tobacco Use: Social History   Tobacco Use  Smoking Status Former Smoker  . Packs/day: 2.50  . Years: 30.00  . Pack years: 75.00  . Types: Cigarettes  . Quit date: 06/11/1978  . Years since quitting: 40.9  Smokeless Tobacco Never Used    Labs: Recent ReviChemical engineerLabs for [facility] Cardiac and Pulmonary Rehab Latest Ref Rng & Units 05/18/2016 07/13/2016 07/14/2016 07/18/2016 06/24/2017   Cholestrol 0 - 200 mg/dL 134 - - - 132   LDLCALC 0 - 99 mg/dL - - - - -   LDLDIRECT mg/dL 65.0 - - - 69.0   HDL >39.00 mg/dL 42.80 - - - 44.80   Trlycerides 0.0 - 149.0 mg/dL 952.0 Triglyceride is over 400; calculations on Lipids are invalid.(H) - - - 1031.0 Triglyceride is over 400; calculations on Lipids are invalid.(H)   Hemoglobin A1c 4.8 - 5.6 % - - - 5.4 -   PHART 7.350 - 7.450 - - 7.403 - -   PCO2ART 32.0 - 48.0 mmHg - - 33.5 - -   HCO3 20.0 - 28.0 mmol/L - - 20.9 - -   TCO2 0 - 100 mmol/L - 24 22 - -   ACIDBASEDEF 0.0 - 2.0 mmol/L - - 3.0(H) - -   O2SAT % - - 98.0 - -       Pulmonary Assessment Scores:    UCSD: Self-administered rating of dyspnea associated with activities of daily living (ADLs) 6-point scale (0 = "not at all" to 5 = "maximal or unable to do because of breathlessness")  Scoring Scores range from 0 to 120.  Minimally important difference is 5 units  CAT: CAT can identify the health impairment of COPD patients and is better correlated with disease progression.  CAT has a scoring  range of zero to 40. The CAT score is classified into four groups of low (less than 10), medium (10 - 20), high (21-30) and very high (31-40) based on the impact level of disease on health status. A CAT score over 10 suggests significant symptoms.  A worsening CAT score could be explained by an exacerbation, poor medication adherence, poor inhaler technique, or progression of COPD or comorbid conditions.  CAT MCID is 2 points  mMRC: mMRC (Modified Medical Research Council) Dyspnea Scale is used to assess the degree of baseline functional disability in patients of respiratory disease due to dyspnea. No minimal important difference is established. A decrease in score of 1 point or greater is considered a positive change.   Pulmonary Function Assessment:   Exercise Target Goals: Exercise Program Goal: Individual exercise prescription set using results from initial 6 min walk test and THRR while considering  patient's activity barriers and safety.   Exercise Prescription Goal: Initial exercise prescription builds to 30-45 minutes a day of aerobic activity, 2-3 days per week.  Home exercise guidelines will be given to patient during program as part of exercise prescription that the participant will acknowledge.  Activity Barriers & Risk Stratification:   6 Minute Walk: 6 Minute Walk    Row Name 03/31/19 1354         6 Minute Walk   Phase  Initial     Distance  400 feet     Walk Time  4.35 minutes     # of Rest Breaks  1     MPH  0.75     METS  0.16     RPE  11     Perceived Dyspnea   2      VO2 Peak  0.57     Symptoms  No     Resting HR  66 bpm     Resting BP  132/64     Resting Oxygen Saturation   97 %     Exercise Oxygen Saturation  during 6 min walk  91 %     Max Ex. HR  87 bpm     Max Ex. BP  150/60     2 Minute Post BP  118/60       Interval HR   1 Minute HR  70     2 Minute HR  87     4 Minute HR  81     5 Minute HR  81     6 Minute HR  80     2 Minute Post HR  72     Interval Heart Rate?  Yes       Interval Oxygen   Interval Oxygen?  Yes     Baseline Oxygen Saturation %  97 %     1 Minute Oxygen Saturation %  90 %     1 Minute Liters of Oxygen  0 L     2 Minute Oxygen Saturation %  90 %     2 Minute Liters of Oxygen  0 L     3 Minute Liters of Oxygen  0 L     4 Minute Oxygen Saturation %  92 %     4 Minute Liters of Oxygen  0 L     5 Minute Oxygen Saturation %  91 %     5 Minute Liters of Oxygen  0 L     6 Minute Oxygen Saturation %  94 %  6 Minute Liters of Oxygen  0 L     2 Minute Post Oxygen Saturation %  94 %     2 Minute Post Liters of Oxygen  0 L       Oxygen Initial Assessment: Oxygen Initial Assessment - 03/26/19 1639      Home Oxygen   Home Oxygen Device  None    Sleep Oxygen Prescription  None    Home Exercise Oxygen Prescription  None    Home at Rest Exercise Oxygen Prescription  None      Initial 6 min Walk   Oxygen Used  None      Program Oxygen Prescription   Program Oxygen Prescription  None      Intervention   Short Term Goals  To learn and understand importance of monitoring SPO2 with pulse oximeter and demonstrate accurate use of the pulse oximeter.;To learn and understand importance of maintaining oxygen saturations>88%;To learn and demonstrate proper pursed lip breathing techniques or other breathing techniques.;To learn and demonstrate proper use of respiratory medications    Long  Term Goals  Verbalizes importance of monitoring SPO2 with pulse oximeter and return demonstration;Maintenance of O2  saturations>88%;Exhibits proper breathing techniques, such as pursed lip breathing or other method taught during program session;Compliance with respiratory medication;Demonstrates proper use of MDI's       Oxygen Re-Evaluation: Oxygen Re-Evaluation    Row Name 04/21/19 0858             Program Oxygen Prescription   Program Oxygen Prescription  None         Home Oxygen   Home Oxygen Device  None       Sleep Oxygen Prescription  None       Home Exercise Oxygen Prescription  None       Home at Rest Exercise Oxygen Prescription  None         Goals/Expected Outcomes   Short Term Goals  To learn and demonstrate proper use of respiratory medications;To learn and understand importance of maintaining oxygen saturations>88%;To learn and demonstrate proper pursed lip breathing techniques or other breathing techniques.;To learn and understand importance of monitoring SPO2 with pulse oximeter and demonstrate accurate use of the pulse oximeter.       Long  Term Goals  Verbalizes importance of monitoring SPO2 with pulse oximeter and return demonstration;Maintenance of O2 saturations>88%;Exhibits proper breathing techniques, such as pursed lip breathing or other method taught during program session;Compliance with respiratory medication;Demonstrates proper use of MDI's       Comments  Eduardo [name] has been doing well in Rehab and has kept his oxygen level up while exercising. He was 92 percent today while on the NuStep. He has a pulse oximeter at home to monitor his oxygen and heart rate. He is taking his breathing medications and does not have any questions about them. Eduardo [name] has been using PLB techniques in the gym and at home when needed. Informed him that he needs to be 88 percent and above when exercising.       Goals/Expected Outcomes  Short:work on PLB more in rehab. Long: become independent with PLB.          Oxygen Discharge (Final Oxygen Re-Evaluation): Oxygen Re-Evaluation - 04/21/19 0858       Program Oxygen Prescription   Program Oxygen Prescription  None      Home Oxygen   Home Oxygen Device  None    Sleep Oxygen Prescription  None    Home Exercise Oxygen Prescription  None    Home at Rest Exercise Oxygen Prescription  None      Goals/Expected Outcomes   Short Term Goals  To learn and demonstrate proper use of respiratory medications;To learn and understand importance of maintaining oxygen saturations>88%;To learn and demonstrate proper pursed lip breathing techniques or other breathing techniques.;To learn and understand importance of monitoring SPO2 with pulse oximeter and demonstrate accurate use of the pulse oximeter.    Long  Term Goals  Verbalizes importance of monitoring SPO2 with pulse oximeter and return demonstration;Maintenance of O2 saturations>88%;Exhibits proper breathing techniques, such as pursed lip breathing or other method taught during program session;Compliance with respiratory medication;Demonstrates proper use of MDI's    Comments  Eduardo [name] has been doing well in Rehab and has kept his oxygen level up while exercising. He was 92 percent today while on the NuStep. He has a pulse oximeter at home to monitor his oxygen and heart rate. He is taking his breathing medications and does not have any questions about them. Eduardo [name] has been using PLB techniques in the gym and at home when needed. Informed him that he needs to be 88 percent and above when exercising.    Goals/Expected Outcomes  Short:work on PLB more in rehab. Long: become independent with PLB.       Initial Exercise Prescription: Initial Exercise Prescription - 03/31/19 1300      Date of Initial Exercise RX and Referring Provider   Date  03/31/19    Referring Provider  Letvak      Treadmill   MPH  0.5    Grade  0    Minutes  2    METs  0.5      Recumbant Bike   Level  1    RPM  50    Watts  15    Minutes  10    METs  1      NuStep   Level  1    SPM  80    Minutes  15    METs  1.5       REL-XR   Level  1    Watts  20    Speed  30    Minutes  10    METs  1.5      T5 Nustep   Level  1    SPM  50    Minutes  5    METs  1      Biostep-RELP   Level  1    SPM  50    Minutes  5    METs  1      Prescription Details   Duration  Progress to 30 minutes of continuous aerobic without signs/symptoms of physical distress      Intensity   Ratings of Perceived Exertion  11-15    Perceived Dyspnea  0-4      Progression   Progression  Continue progressive overload as per policy without signs/symptoms or physical distress.      Resistance Training   Training Prescription  Yes    Weight  3    Reps  10-15       Perform Capillary Blood Glucose checks as needed.  Exercise Prescription Changes: Exercise Prescription Changes    Row Name 04/08/19 1300 04/14/19 0900 04/22/19 1200 05/04/19 0900 05/04/19 0932     Response to Exercise   Blood Pressure (Admit)  116/52  -  140/60  132/68  -   Blood Pressure (Exercise)  140/64  -  124/62  130/68  -   Blood Pressure (Exit)  136/74  -  148/60  128/60  -   Heart Rate (Admit)  70 bpm  -  72 bpm  68 bpm  -   Heart Rate (Exercise)  88 bpm  -  92 bpm  89 bpm  -   Heart Rate (Exit)  67 bpm  -  77 bpm  72 bpm  -   Oxygen Saturation (Admit)  93 %  -  94 %  92 %  -   Oxygen Saturation (Exercise)  92 %  -  91 %  90 %  -   Oxygen Saturation (Exit)  95 %  -  93 %  95 %  -   Rating of Perceived Exertion (Exercise)  13  -  11  13  -   Perceived Dyspnea (Exercise)  3  -  2  2  -   Symptoms  SOB  -  SOB  SOB  -   Duration  Continue with 30 min of aerobic exercise without signs/symptoms of physical distress.  -  Continue with 30 min of aerobic exercise without signs/symptoms of physical distress.  Continue with 30 min of aerobic exercise without signs/symptoms of physical distress.  -   Intensity  THRR unchanged  -  THRR unchanged  THRR unchanged  -     Progression   Progression  Continue to progress workloads to maintain intensity without  signs/symptoms of physical distress.  -  Continue to progress workloads to maintain intensity without signs/symptoms of physical distress.  Continue to progress workloads to maintain intensity without signs/symptoms of physical distress.  -   Average METs  2.26  -  2.2  2.1  -     Resistance Training   Training Prescription  Yes  -  Yes  Yes  -   Weight  3 lbs  -  3 lb  3 lb  -   Reps  10-15  -  10-15  10-15  -     Interval Training   Interval Training  No  -  No  No  -     Recumbant Bike   Level  1  -  -  -  -   Watts  18  -  -  -  -   Minutes  15  -  -  -  -   METs  2.97  -  -  -  -     NuStep   Level  -  -  3  3  -   Minutes  -  -  30  30  -   METs  -  -  2.5  2.1  -     REL-XR   Level  1  -  -  -  -   Minutes  15  -  -  -  -     T5 Nustep   Level  1  -  1  -  -   Minutes  15  -  15  -  -   METs  1.8  -  2.2  -  -     Biostep-RELP   Level  1  -  -  3  -   Minutes  15  -  -  15  -   METs  2  -  -  2  -  Home Exercise Plan   Plans to continue exercise at  -  Home (comment) looking for a Nustep for home  Home (comment) looking for a Nustep for home  -  Home (comment) looking for a Nustep for home   Frequency  -  Add 1 additional day to program exercise sessions.  Add 1 additional day to program exercise sessions.  -  Add 1 additional day to program exercise sessions.   Initial Home Exercises Provided  -  04/14/19  04/14/19  -  04/14/19      Exercise Comments: Exercise Comments    Row Name 04/02/19 0902           Exercise Comments  First full day of exercise!  Patient was oriented to gym and equipment including functions, settings, policies, and procedures.  Patient's individual exercise prescription and treatment plan were reviewed.  All starting workloads were established based on the results of the 6 minute walk test done at initial orientation visit.  The plan for exercise progression was also introduced and progression will be customized based on patient's  performance and goals.          Exercise Goals and Review: Exercise Goals    Row Name 03/26/19 1639 03/31/19 1400           Exercise Goals   Increase Physical Activity  Yes  Yes      Intervention  Provide advice, education, support and counseling about physical activity/exercise needs.;Develop an individualized exercise prescription for aerobic and resistive training based on initial evaluation findings, risk stratification, comorbidities and participant's personal goals.  Provide advice, education, support and counseling about physical activity/exercise needs.;Develop an individualized exercise prescription for aerobic and resistive training based on initial evaluation findings, risk stratification, comorbidities and participant's personal goals.      Expected Outcomes  Short Term: Attend rehab on a regular basis to increase amount of physical activity.;Long Term: Add in home exercise to make exercise part of routine and to increase amount of physical activity.;Long Term: Exercising regularly at least 3-5 days a week.  Short Term: Attend rehab on a regular basis to increase amount of physical activity.;Long Term: Add in home exercise to make exercise part of routine and to increase amount of physical activity.;Long Term: Exercising regularly at least 3-5 days a week.      Increase Strength and Stamina  Yes  -      Intervention  Provide advice, education, support and counseling about physical activity/exercise needs.;Develop an individualized exercise prescription for aerobic and resistive training based on initial evaluation findings, risk stratification, comorbidities and participant's personal goals.  Provide advice, education, support and counseling about physical activity/exercise needs.;Develop an individualized exercise prescription for aerobic and resistive training based on initial evaluation findings, risk stratification, comorbidities and participant's personal goals.      Expected  Outcomes  Short Term: Increase workloads from initial exercise prescription for resistance, speed, and METs.;Short Term: Perform resistance training exercises routinely during rehab and add in resistance training at home;Long Term: Improve cardiorespiratory fitness, muscular endurance and strength as measured by increased METs and functional capacity (6MWT)  Short Term: Increase workloads from initial exercise prescription for resistance, speed, and METs.;Short Term: Perform resistance training exercises routinely during rehab and add in resistance training at home;Long Term: Improve cardiorespiratory fitness, muscular endurance and strength as measured by increased METs and functional capacity (6MWT)      Able to understand and use rate of perceived exertion (RPE) scale  Yes  Yes  Intervention  Provide education and explanation on how to use RPE scale  Provide education and explanation on how to use RPE scale      Expected Outcomes  Short Term: Able to use RPE daily in rehab to express subjective intensity level;Long Term:  Able to use RPE to guide intensity level when exercising independently  Short Term: Able to use RPE daily in rehab to express subjective intensity level;Long Term:  Able to use RPE to guide intensity level when exercising independently      Able to understand and use Dyspnea scale  Yes  Yes      Intervention  Provide education and explanation on how to use Dyspnea scale  Provide education and explanation on how to use Dyspnea scale      Expected Outcomes  Short Term: Able to use Dyspnea scale daily in rehab to express subjective sense of shortness of breath during exertion;Long Term: Able to use Dyspnea scale to guide intensity level when exercising independently  Short Term: Able to use Dyspnea scale daily in rehab to express subjective sense of shortness of breath during exertion;Long Term: Able to use Dyspnea scale to guide intensity level when exercising independently       Knowledge and understanding of Target Heart Rate Range (THRR)  Yes  Yes      Intervention  Provide education and explanation of THRR including how the numbers were predicted and where they are located for reference  Provide education and explanation of THRR including how the numbers were predicted and where they are located for reference      Expected Outcomes  Short Term: Able to state/look up THRR;Short Term: Able to use daily as guideline for intensity in rehab;Long Term: Able to use THRR to govern intensity when exercising independently  Short Term: Able to state/look up THRR;Short Term: Able to use daily as guideline for intensity in rehab;Long Term: Able to use THRR to govern intensity when exercising independently      Able to check pulse independently  Yes  Yes      Intervention  Provide education and demonstration on how to check pulse in carotid and radial arteries.;Review the importance of being able to check your own pulse for safety during independent exercise  Provide education and demonstration on how to check pulse in carotid and radial arteries.;Review the importance of being able to check your own pulse for safety during independent exercise      Expected Outcomes  Short Term: Able to explain why pulse checking is important during independent exercise;Long Term: Able to check pulse independently and accurately  Short Term: Able to explain why pulse checking is important during independent exercise;Long Term: Able to check pulse independently and accurately      Understanding of Exercise Prescription  Yes  Yes      Intervention  Provide education, explanation, and written materials on patient's individual exercise prescription  Provide education, explanation, and written materials on patient's individual exercise prescription      Expected Outcomes  Short Term: Able to explain program exercise prescription;Long Term: Able to explain home exercise prescription to exercise independently  Short  Term: Able to explain program exercise prescription;Long Term: Able to explain home exercise prescription to exercise independently         Exercise Goals Re-Evaluation : Exercise Goals Re-Evaluation    Row Name 04/02/19 0902 04/08/19 1326 04/14/19 0942 04/21/19 0854 05/04/19 0925     Exercise Goal Re-Evaluation   Exercise Goals Review  Able to understand and use rate of perceived exertion (RPE) scale;Knowledge and understanding of Target Heart Rate Range (THRR);Understanding of Exercise Prescription  Increase Physical Activity;Increase Strength and Stamina;Understanding of Exercise Prescription  Increase Physical Activity;Increase Strength and Stamina;Able to understand and use rate of perceived exertion (RPE) scale;Knowledge and understanding of Target Heart Rate Range (THRR);Able to check pulse independently;Understanding of Exercise Prescription  Increase Physical Activity;Increase Strength and Stamina  Increase Physical Activity;Increase Strength and Stamina;Understanding of Exercise Prescription   Comments  Reviewed RPE scale, THR and program prescription with pt today.  Pt voiced understanding and was given a copy of goals to take home.  Eduardo [name] is off to a good start.  He has completed his first three days of exercise. He was able to get 2 METs on the BioStep. We will continue to monitor his progress.  Reviewed home exercise with pt today.  Pt plans to get a nustep for home for exercise.  Reviewed THR, pulse, RPE, sign and symptoms, NTG use, and when to call 911 or MD.  Also discussed weather considerations and indoor options.  Pt voiced understanding.  Eduardo [name] has been working out in the [employer] regularly. He is still trying to find a new step for his home. He has been taking advantage of the programs that the [facility] offers. He is trying to exercise as much as he can without overdoing it. He has been enjoying exercise in the program and is doing well. Eduardo [name] wants to have enough energy to be able  to walk up a full flight of stairs.  Eduardo [name] has been doing well in rehab.  He is on level 3 on the BioStep now!  We will continue to monitor his progress.   Expected Outcomes  hort: Use RPE daily to regulate intensity. Long: Follow program prescription in THR.  Short: Continue to attend regularly.  Long: Continue to follow program prescription.  Short - do strength work at home until he can find nustep :Long - maintain exercise on his own  Short: be able to walk further. Long: maintain walking and exercise independently.  Short: Increase workloads.  Long: Continue to increase walking at home.      Discharge Exercise Prescription (Final Exercise Prescription Changes): Exercise Prescription Changes - 05/04/19 0932      Home Exercise Plan   Plans to continue exercise at  Home (comment)   looking for a Nustep for home   Frequency  Add 1 additional day to program exercise sessions.    Initial Home Exercises Provided  04/14/19       Nutrition:  Target Goals: Understanding of nutrition guidelines, daily intake of sodium <1527m, cholesterol <2040m calories 30% from fat and 7% or less from saturated fats, daily to have 5 or more servings of fruits and vegetables.  Biometrics: Pre Biometrics - 03/31/19 1400      Pre Biometrics   Height  _0  (1.575 m)    Weight  142 lb 1.6 oz (64.5 kg)    BMI (Calculated)  25.98        Nutrition Therapy Plan and Nutrition Goals: Nutrition Therapy & Goals - 04/27/19 1516      Nutrition Therapy   Diet  Low Na, HH diet    Protein (specify units)  55g    Fiber  30 grams    Whole Grain Foods  3 servings    Saturated Fats  12 max. grams    Fruits and Vegetables  5 servings/day    Sodium  1.5 grams      Personal Nutrition Goals   Nutrition Goal  N/A    Comments  RD eval deferred at this time by wife [name]. Will confirm with pt and continue to check in during re-evaluations. Pt wife reports eating mediterranean diet; lots of fruits and vegetables, fish,  lean protein, healthy fats, whole grains. Pt wife reports that he could do better with sodium and water, but that their overall lifestyle is healthy. tips on how to optimize diet for overall health and providing any resources as well as tips and checking in as role of RD.       Nutrition Assessments: Nutrition Assessments - 03/31/19 1410      MEDFICTS Scores   Pre Score  42       Nutrition Goals Re-Evaluation:   Nutrition Goals Discharge (Final Nutrition Goals Re-Evaluation):   Psychosocial: Target Goals: Acknowledge presence or absence of significant depression and/or stress, maximize coping skills, provide positive support system. Participant is able to verbalize types and ability to use techniques and skills needed for reducing stress and depression.   Initial Review & Psychosocial Screening: Initial Psych Review & Screening - 03/26/19 1640      Initial Review   Current issues with  Current Psychotropic Meds;Current Anxiety/Panic      Family Dynamics   Good Support System?  Yes    Comments  He can look to his wife, daughter and son for support. He talks with a therapist about his anxiety.      Barriers   Psychosocial barriers to participate in program  The patient should benefit from training in stress management and relaxation.      Screening Interventions   Interventions  Encouraged to exercise;To provide support and resources with identified psychosocial needs;Provide feedback about the scores to participant    Expected Outcomes  Short Term goal: Utilizing psychosocial counselor, staff and physician to assist with identification of specific Stressors or current issues interfering with healing process. Setting desired goal for each stressor or current issue identified.;Long Term Goal: Stressors or current issues are controlled or eliminated.;Long Term goal: The participant improves quality of Life and PHQ9 Scores as seen by post scores and/or verbalization of changes;Short  Term goal: Identification and review with participant of any Quality of Life or Depression concerns found by scoring the questionnaire.       Quality of Life Scores:  Scores of 19 and below usually indicate a poorer quality of life in these areas.  A difference of  2-3 points is a clinically meaningful difference.  A difference of 2-3 points in the total score of the Quality of Life Index has been associated with significant improvement in overall quality of life, self-image, physical symptoms, and general health in studies assessing change in quality of life.  PHQ-9: Recent Review Flowsheet Data    Depression screen [facility] 2/9 06/24/2017 05/29/2016   Decreased Interest 0 0   Down, Depressed, Hopeless 0 0   PHQ - 2 Score 0 0     Interpretation of Total Score  Total Score Depression Severity:  1-4 = Minimal depression, 5-9 = Mild depression, 10-14 = Moderate depression, 15-19 = Moderately severe depression, 20-27 = Severe depression   Psychosocial Evaluation and Intervention:   Psychosocial Re-Evaluation: Psychosocial Re-Evaluation    [place] Name 04/21/19 0903             Psychosocial Re-Evaluation   Current issues with  Current Depression;Current Psychotropic Meds;Current Anxiety/Panic  Comments  He has some anxiety with his health and when he gets short of breath. Eduardo [name] stays positive and wants to live as long as possible. He looks forward having breakfast daily and likes to watch [employer].       Expected Outcomes  Short: continue to exercise to reduce stress. Long: maintain exercise to keep stress at a minimum.       Interventions  Encouraged to attend Pulmonary Rehabilitation for the exercise       Continue Psychosocial Services   Follow up required by staff          Psychosocial Discharge (Final Psychosocial Re-Evaluation): Psychosocial Re-Evaluation - 04/21/19 0903      Psychosocial Re-Evaluation   Current issues with  Current Depression;Current Psychotropic  Meds;Current Anxiety/Panic    Comments  He has some anxiety with his health and when he gets short of breath. Eduardo [name] stays positive and wants to live as long as possible. He looks forward having breakfast daily and likes to watch [employer].    Expected Outcomes  Short: continue to exercise to reduce stress. Long: maintain exercise to keep stress at a minimum.    Interventions  Encouraged to attend Pulmonary Rehabilitation for the exercise    Continue Psychosocial Services   Follow up required by staff       Education: Education Goals: Education classes will be provided on a weekly basis, covering required topics. Participant will state understanding/return demonstration of topics presented.  Learning Barriers/Preferences: Learning Barriers/Preferences - 03/26/19 1642      Learning Barriers/Preferences   Learning Barriers  None    Learning Preferences  None       Education Topics:  Initial Evaluation Education: - Verbal, written and demonstration of respiratory meds, oximetry and breathing techniques. Instruction on use of nebulizers and MDIs and importance of monitoring MDI activations.   Pulmonary Rehab from 03/31/2019 in [facility] Cardiac and Pulmonary Rehab  Date  03/31/19  Educator  [place]  Instruction Review Code  1- Verbalizes Understanding      General Nutrition Guidelines/Fats and Fiber: -Group instruction provided by verbal, written material, models and posters to present the general guidelines for heart healthy nutrition. Gives an explanation and review of dietary fats and fiber.   Cardiac Rehab from 04/30/2019 in [facility] Cardiac and Pulmonary Rehab  Date  04/30/19  Educator  mc  Instruction Review Code  1- Verbalizes Understanding      Controlling Sodium/Reading Food Labels: -Group verbal and written material supporting the discussion of sodium use in heart healthy nutrition. Review and explanation with models, verbal and written materials for utilization of the food  label.   Exercise Physiology & General Exercise Guidelines: - Group verbal and written instruction with models to review the exercise physiology of the cardiovascular system and associated critical values. Provides general exercise guidelines with specific guidelines to those with heart or lung disease.    Aerobic Exercise & Resistance Training: - Gives group verbal and written instruction on the various components of exercise. Focuses on aerobic and resistive training programs and the benefits of this training and how to safely progress through these programs.   Cardiac Rehab from 04/30/2019 in [facility] Cardiac and Pulmonary Rehab  Date  04/16/19  Educator  jh  Instruction Review Code  1- Verbalizes Understanding      Flexibility, Balance, Mind/Body Relaxation: Provides group verbal/written instruction on the benefits of flexibility and balance training, including mind/body exercise modes such as yoga, pilates and tai chi.  Demonstration and skill  practice provided.   Cardiac Rehab from 04/30/2019 in [facility] Cardiac and Pulmonary Rehab  Date  04/30/19  Educator  as  Instruction Review Code  1- Verbalizes Understanding      Stress and Anxiety: - Provides group verbal and written instruction about the health risks of elevated stress and causes of high stress.  Discuss the correlation between heart/lung disease and anxiety and treatment options. Review healthy ways to manage with stress and anxiety.   Depression: - Provides group verbal and written instruction on the correlation between heart/lung disease and depressed mood, treatment options, and the stigmas associated with seeking treatment.   Exercise & Equipment Safety: - Individual verbal instruction and demonstration of equipment use and safety with use of the equipment.   Infection Prevention: - Provides verbal and written material to individual with discussion of infection control including proper hand washing and proper  equipment cleaning during exercise session.   Pulmonary Rehab from 03/31/2019 in [facility] Cardiac and Pulmonary Rehab  Date  03/31/19  Educator  [place]  Instruction Review Code  1- Verbalizes Understanding      Falls Prevention: - Provides verbal and written material to individual with discussion of falls prevention and safety.   Pulmonary Rehab from 03/31/2019 in [facility] Cardiac and Pulmonary Rehab  Date  03/31/19  Educator  [place]  Instruction Review Code  1- Verbalizes Understanding      Diabetes: - Individual verbal and written instruction to review signs/symptoms of diabetes, desired ranges of glucose level fasting, after meals and with exercise. Advice that pre and post exercise glucose checks will be done for 3 sessions at entry of program.   Chronic Lung Diseases: - Group verbal and written instruction to review updates, respiratory medications, advancements in procedures and treatments. Discuss use of supplemental oxygen including available portable oxygen systems, continuous and intermittent flow rates, concentrators, personal use and safety guidelines. Review proper use of inhaler and spacers. Provide informative websites for self-education.    Energy Conservation: - Provide group verbal and written instruction for methods to conserve energy, plan and organize activities. Instruct on pacing techniques, use of adaptive equipment and posture/positioning to relieve shortness of breath.   Triggers and Exacerbations: - Group verbal and written instruction to review types of environmental triggers and ways to prevent exacerbations. Discuss weather changes, air quality and the benefits of nasal washing. Review warning signs and symptoms to help prevent infections. Discuss techniques for effective airway clearance, coughing, and vibrations.   AED/CPR: - Group verbal and written instruction with the use of models to demonstrate the basic use of the AED with the basic ABC's of  resuscitation.   Anatomy and Physiology of the Lungs: - Group verbal and written instruction with the use of models to provide basic lung anatomy and physiology related to function, structure and complications of lung disease.   Anatomy & Physiology of the Heart: - Group verbal and written instruction and models provide basic cardiac anatomy and physiology, with the coronary electrical and arterial systems. Review of Valvular disease and Heart Failure   Cardiac Medications: - Group verbal and written instruction to review commonly prescribed medications for heart disease. Reviews the medication, class of the drug, and side effects.   Know Your Numbers and Risk Factors: -Group verbal and written instruction about important numbers in your health.  Discussion of what are risk factors and how they play a role in the disease process.  Review of Cholesterol, Blood Pressure, Diabetes, and BMI and the role they play  in your overall health.   Cardiac Rehab from 04/02/2019 in [facility] Cardiac and Pulmonary Rehab  Date  04/02/19  Educator  [facility]  Instruction Review Code  1- Verbalizes Understanding      Sleep Hygiene: -Provides group verbal and written instruction about how sleep can affect your health.  Define sleep hygiene, discuss sleep cycles and impact of sleep habits. Review good sleep hygiene tips.    Other: -Provides group and verbal instruction on various topics (see comments)    Knowledge Questionnaire Score:    Core Components/Risk Factors/Patient Goals at Admission: Personal Goals and Risk Factors at Admission - 03/26/19 1642      Core Components/Risk Factors/Patient Goals on Admission    Weight Management  Weight Loss;Yes    Intervention  Weight Management: Develop a combined nutrition and exercise program designed to reach desired caloric intake, while maintaining appropriate intake of nutrient and fiber, sodium and fats, and appropriate energy expenditure required for the  weight goal.;Weight Management: Provide education and appropriate resources to help participant work on and attain dietary goals.    Expected Outcomes  Short Term: Continue to assess and modify interventions until short term weight is achieved;Long Term: Adherence to nutrition and physical activity/exercise program aimed toward attainment of established weight goal;Weight Loss: Understanding of general recommendations for a balanced deficit meal plan, which promotes 1-2 lb weight loss per week and includes a negative energy balance of [phone] kcal/d;Understanding of distribution of calorie intake throughout the day with the consumption of 4-5 meals/snacks    Improve shortness of breath with ADL's  Yes    Intervention  Provide education, individualized exercise plan and daily activity instruction to help decrease symptoms of SOB with activities of daily living.    Expected Outcomes  Short Term: Improve cardiorespiratory fitness to achieve a reduction of symptoms when performing ADLs;Long Term: Be able to perform more ADLs without symptoms or delay the onset of symptoms    Heart Failure  Yes    Intervention  Provide a combined exercise and nutrition program that is supplemented with education, support and counseling about heart failure. Directed toward relieving symptoms such as shortness of breath, decreased exercise tolerance, and extremity edema.    Expected Outcomes  Improve functional capacity of life;Short term: Attendance in program 2-3 days a week with increased exercise capacity. Reported lower sodium intake. Reported increased fruit and vegetable intake. Reports medication compliance.;Short term: Daily weights obtained and reported for increase. Utilizing diuretic protocols set by physician.;Long term: Adoption of self-care skills and reduction of barriers for early signs and symptoms recognition and intervention leading to self-care maintenance.    Hypertension  Yes    Intervention  Provide  education on lifestyle modifcations including regular physical activity/exercise, weight management, moderate sodium restriction and increased consumption of fresh fruit, vegetables, and low fat dairy, alcohol moderation, and smoking cessation.;Monitor prescription use compliance.    Expected Outcomes  Short Term: Continued assessment and intervention until BP is < 140/46m HG in hypertensive participants. < 130/817mHG in hypertensive participants with diabetes, heart failure or chronic kidney disease.;Long Term: Maintenance of blood pressure at goal levels.    Lipids  Yes    Intervention  Provide education and support for participant on nutrition & aerobic/resistive exercise along with prescribed medications to achieve LDL <7044mHDL >27m20m  Expected Outcomes  Short Term: Participant states understanding of desired cholesterol values and is compliant with medications prescribed. Participant is following exercise prescription and nutrition guidelines.;Long Term: Cholesterol controlled with  medications as prescribed, with individualized exercise RX and with personalized nutrition plan. Value goals: LDL < 23m, HDL > 40 mg.       Core Components/Risk Factors/Patient Goals Review:  Goals and Risk Factor Review    Row Name 04/21/19 0910             Core Components/Risk Factors/Patient Goals Review   Personal Goals Review  Weight Management/Obesity;Hypertension;Lipids;Improve shortness of breath with ADL's;Heart Failure       Review  Eduardo [name] to lose some weight. His shortness of breath has improved slightly since the begining of the program. His blood pressure has been slightly elevated at 140/60 today. He wants to lose a little weight and be around 135 pounds.       Expected Outcomes  Short: lose some weight. Long: maitnain weight loss independently.          Core Components/Risk Factors/Patient Goals at Discharge (Final Review):  Goals and Risk Factor Review - 04/21/19 0910      Core  Components/Risk Factors/Patient Goals Review   Personal Goals Review  Weight Management/Obesity;Hypertension;Lipids;Improve shortness of breath with ADL's;Heart Failure    Review  Eduardo [name] to lose some weight. His shortness of breath has improved slightly since the begining of the program. His blood pressure has been slightly elevated at 140/60 today. He wants to lose a little weight and be around 135 pounds.    Expected Outcomes  Short: lose some weight. Long: maitnain weight loss independently.       ITP Comments: ITP Comments    Row Name 03/26/19 1658 04/02/19 0902 04/15/19 0701 04/27/19 1515 05/13/19 1042   ITP Comments  Virtual Orientation complete. Visit diagnosis can be found in [facility]. RD and EP evaluation date on 03/31/2019 0930.  First full day of exercise!  Patient was oriented to gym and equipment including functions, settings, policies, and procedures.  Patient's individual exercise prescription and treatment plan were reviewed.  All starting workloads were established based on the results of the 6 minute walk test done at initial orientation visit.  The plan for exercise progression was also introduced and progression will be customized based on patient's performance and goals.  30 day review completed. Continue with ITP sent to Dr. MEmily Filbert Medical Director of Cardiac and Pulmonary Rehab for review , changes as needed and signature.   New to program  RD eval deferred at this time - will continue to check in  30 day review competed . ITP sent to Dr MEmily Filbertfor review, changes as needed and ITP approval signature.      Comments:

## 2019-05-14 ENCOUNTER — Encounter: Payer: PPO | Admitting: *Deleted

## 2019-05-14 ENCOUNTER — Other Ambulatory Visit: Payer: Self-pay

## 2019-05-14 DIAGNOSIS — I5030 Unspecified diastolic (congestive) heart failure: Secondary | ICD-10-CM | POA: Diagnosis not present

## 2019-05-14 DIAGNOSIS — I5032 Chronic diastolic (congestive) heart failure: Secondary | ICD-10-CM

## 2019-05-14 NOTE — Progress Notes (Signed)
Daily Session Note  Patient Details  Name: Eduardo [name] MRN: [id number] Date of Birth: 09-Sep-1930 Referring Provider:     Pulmonary Rehab from 03/31/2019 in [facility] Cardiac and Pulmonary Rehab  Referring Provider  [place]      Encounter Date: 05/14/2019  Check In: Session Check In - 05/14/19 0926      Check-In   Supervising physician immediately available to respond to emergencies  See telemetry face sheet for immediately available ER MD    Location  [facility]-Cardiac & Pulmonary Rehab    Staff Present  Eduardo Lark, RN, BSN, CCRP;Eduardo [name] BS, Exercise Physiologist;Eduardo [place], MA, RCEP, CCRP, CCET    Virtual Visit  No    Medication changes reported      No    Fall or balance concerns reported     No    Warm-up and Cool-down  Performed on first and last piece of equipment    Resistance Training Performed  Yes    VAD Patient?  No    PAD/SET Patient?  No      Pain Assessment   Currently in Pain?  No/denies          Social History   Tobacco Use  Smoking Status Former Smoker  . Packs/day: 2.50  . Years: 30.00  . Pack years: 75.00  . Types: Cigarettes  . Quit date: 06/11/1978  . Years since quitting: 40.9  Smokeless Tobacco Never Used    Goals Met:  Independence with exercise equipment Exercise tolerated well No report of cardiac concerns or symptoms  Goals Unmet:  Not Applicable  Comments: Pt able to follow exercise prescription today without complaint.  Will continue to monitor for progression.    Dr. Emily [name] is Medical Director for [name].

## 2019-05-15 ENCOUNTER — Encounter: Payer: PPO | Admitting: *Deleted

## 2019-05-15 DIAGNOSIS — L920 Granuloma annulare: Secondary | ICD-10-CM | POA: Diagnosis not present

## 2019-05-15 DIAGNOSIS — D1721 Benign lipomatous neoplasm of skin and subcutaneous tissue of right arm: Secondary | ICD-10-CM | POA: Diagnosis not present

## 2019-05-15 DIAGNOSIS — I5032 Chronic diastolic (congestive) heart failure: Secondary | ICD-10-CM

## 2019-05-15 DIAGNOSIS — D692 Other nonthrombocytopenic purpura: Secondary | ICD-10-CM | POA: Diagnosis not present

## 2019-05-15 DIAGNOSIS — L821 Other seborrheic keratosis: Secondary | ICD-10-CM | POA: Diagnosis not present

## 2019-05-15 DIAGNOSIS — I5030 Unspecified diastolic (congestive) heart failure: Secondary | ICD-10-CM | POA: Diagnosis not present

## 2019-05-15 DIAGNOSIS — L820 Inflamed seborrheic keratosis: Secondary | ICD-10-CM | POA: Diagnosis not present

## 2019-05-15 DIAGNOSIS — D170 Benign lipomatous neoplasm of skin and subcutaneous tissue of head, face and neck: Secondary | ICD-10-CM | POA: Diagnosis not present

## 2019-05-15 NOTE — Progress Notes (Signed)
Daily Session Note  Patient Details  Name: Eduardo [name] MRN: [id number] Date of Birth: 28-Nov-1930 Referring Provider:     Pulmonary Rehab from 03/31/2019 in [facility] Cardiac and Pulmonary Rehab  Referring Provider  [name]      Encounter Date: 05/15/2019  Check In: Session Check In - 05/15/19 1054      Check-In   Supervising physician immediately available to respond to emergencies  See telemetry face sheet for immediately available ER MD    Location  [facility]-Cardiac & Pulmonary Rehab    Staff Present  Heath Lark, RN, BSN, CCRP;Joseph Hood RCP,RRT,BSRT;Amanda Ivan, [place], ACSM CEP, Exercise Physiologist    Virtual Visit  No    Medication changes reported      No    Fall or balance concerns reported     No    Warm-up and Cool-down  Performed on first and last piece of equipment    Resistance Training Performed  Yes    VAD Patient?  No    PAD/SET Patient?  No      Pain Assessment   Currently in Pain?  No/denies          Social History   Tobacco Use  Smoking Status Former Smoker  . Packs/day: 2.50  . Years: 30.00  . Pack years: 75.00  . Types: Cigarettes  . Quit date: 06/11/1978  . Years since quitting: 40.9  Smokeless Tobacco Never Used    Goals Met:  Proper associated with RPD/PD & O2 Sat Independence with exercise equipment Exercise tolerated well No report of cardiac concerns or symptoms  Goals Unmet:  Not Applicable  Comments: Pt able to follow exercise prescription today without complaint.  Will continue to monitor for progression.    Dr. Emily Filbert is Medical Director for [name].

## 2019-05-15 NOTE — Progress Notes (Signed)
Cardiology Office Note  Date:  05/18/2019   ID:  Eduardo RICKETT, DOB [birth year], MRN [id number]  PCP:  Eduardo Carbon, [name]   Chief Complaint  Patient presents with  . other    2-3 month follow up. Meds reviewed by the pt. verbally. "doing well."     HPI:  Eduardo [name] is a 85 y.o. male  Smoking history but stopped smoking 85 yo  cardiac catheterization in 2012 with moderate LAD and RCA disease STEMI with repeat catheterization documenting RCA dissection, managed medically chronic atrial fibrillation,  embolic stroke,  coronary artery disease,  hypertension Last seen in 2016 Right Carotid stenosis moderate 11/27/2017 LE arterial doppler ok 11/27/2017 COPD exacerbation 07/2016 Who presents for follow-up of his coronary disease, PAD  Reports that he continues to have shortness of breath, moderate to severe but stable Participating in cardiac rehab   echocardiogram in March 2020 results discussed with him showed normal LV function with an RVSP of 68.6 mmHg, severely dilated left atrium, at least moderate mitral regurgitation, and mild aortic stenosis.    Prior echocardiogram 2018 with right heart pressures of 50  Prior echocardiogram 2016 with right ventricular systolic pressure 34  Prior chest discomfort   underwent stress testing which was nonischemic, March 2020  Tolerating Lasix 40 daily Dose was increased from 20-40 several months ago Slight climbing BUN otherwise stable  Previously seen by pulmonology  EKG personally reviewed by myself on todays visit Shows atrial fibrillation ventricular rate 78 bpm intraventricular conduction delay, PVCs  Other past medical history reviewed Carotid ultrasound with less than 39% bilateral carotid disease  GI bleed/profound anemia in July 2013 with a fall at the time Hemoglobin at that time was around 6. 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  12/2011.), Neuropathy (2014), Osteoarthritis (2002), Permanent atrial fibrillation ([place]) (2012), Stroke ([place]) (2016), and Upper GI bleed (08/2015).  PSH:    Past Surgical History:  Procedure Laterality Date  . APPENDECTOMY    . CARDIAC CATHETERIZATION  2012   50% LAD and luminal irreg in RCA, 1/12  Post cath MI from damage  . CARPAL TUNNEL RELEASE     left  . CARPAL TUNNEL RELEASE  10/11/11   Dr Rush Barer  . CATARACT EXTRACTION W/ INTRAOCULAR LENS IMPLANT  2/13   Dr [name]  . COLONOSCOPY  2001, 02/2010   Dr Deatra Ina.   . ESOPHAGOGASTRODUODENOSCOPY N/A 08/15/2015   Eduardo [name]; 2 small gastric AVMs, 1 actively bleeding.  Both treated with APC ablation, clipping of the bleeder  . FOOT FRACTURE SURGERY     right heel repair  . FRACTURE SURGERY    . KNEE ARTHROSCOPY     left, right knee 10/2009  . MASTOIDECTOMY     x3 on right  . NASAL SEPTUM SURGERY     nasal septal repair  . PENILE PROSTHESIS  REMOVAL    . PENILE PROSTHESIS IMPLANT     x 2--got infection after 2nd and  had to remove  . PILONIDAL CYST / SINUS EXCISION    . SHOULDER ARTHROSCOPY     right  . TONSILLECTOMY AND ADENOIDECTOMY      Current Outpatient Medications  Medication Sig Dispense Refill  . acetaminophen (TYLENOL) 650 MG CR tablet Take 1,300 mg by mouth 2 (two) times daily.    Marland Kitchen albuterol (PROVENTIL) (2.5 MG/3ML) 0.083% nebulizer solution Use 1 vial in  nebulizer every 4 hours as needed for wheezing or shortness of breath 270 mL 2  . ALPRAZolam (XANAX) 0.25 MG tablet Take 1 tablet (0.25 mg total) by mouth 2 (two) times daily as needed for anxiety. 20 tablet 0  . atorvastatin (LIPITOR) 80 MG tablet Take 1 tablet (80 mg total) by mouth daily. 90 tablet 1  . B Complex-C (SUPER B COMPLEX PO) Take 1 tablet by mouth at bedtime.     . cetirizine (ZYRTEC) 10 MG tablet Take 10 mg by mouth at bedtime.     . cholecalciferol (VITAMIN D) 1000 units tablet Take 1,000 Units by mouth daily.    Marland Kitchen ELIQUIS 5 MG TABS tablet TAKE ONE TABLET BY MOUTH TWICE DAILY  180 tablet 0  . escitalopram (LEXAPRO) 20 MG tablet Take 20 mg by mouth at bedtime.     . fenofibrate 160 MG tablet TAKE ONE TABLET BY MOUTH DAILY AT BEDTIME  90 tablet 3  . furosemide (LASIX) 40 MG tablet Take 1 tablet (40 mg total) by mouth daily. 90 tablet 3  . hydrALAZINE (APRESOLINE) 10 MG tablet TAKE ONE TABLET BY MOUTH THREE TIMES DAILY  270 tablet 0  . ipratropium (ATROVENT) 0.03 % nasal spray PLACE 2 SPRAYS INTO BOTH NOSTRILS AT BEDTIME 30 mL 2  . lamoTRIgine (LAMICTAL) 150 MG tablet Take 150 mg by mouth at bedtime. Reported on 08/14/2015    . montelukast (SINGULAIR) 10 MG tablet TAKE ONE TABLET BY MOUTH DAILY AT BEDTIME  90 tablet 0  . Multiple Vitamin (MULTIVITAMIN) tablet Take 1 tablet by mouth at bedtime.     . pantoprazole (PROTONIX) 40 MG tablet TAKE 1 TABLET BY MOUTH DAILY BEFORE BREAKFAST. 90 tablet 3  . Tiotropium Bromide-Olodaterol (STIOLTO RESPIMAT) 2.5-2.5 MCG/ACT AERS Inhale 2 puffs into the lungs daily. 3 Inhaler 1  . triamcinolone cream (KENALOG) 0.1 % Apply 1 application topically 2 (two) times daily as needed (for skin).     . verapamil (CALAN-SR) 240 MG CR tablet TAKE ONE TABLET BY MOUTH AT BEDTIME 90 tablet 0  . nitroGLYCERIN (NITROSTAT) 0.4 MG SL tablet Place 1 tablet (0.4 mg total) under the tongue every 5 (five) minutes as needed for chest pain. 90 tablet 3   No current  facility-administered medications for this visit.      Allergies:   Penicillins, Sulfonamide derivatives, and Tape   Social History:  The patient  reports that he quit smoking about 40 years ago. His smoking use included cigarettes. He has a 75.00 pack-year smoking history. He has never used smokeless tobacco. He reports that he does not drink alcohol or use drugs.   Family History:   family history includes Cancer in his mother; Heart attack in his father; Stroke in his maternal grandmother.    Review of Systems: Review of Systems  Constitutional: Negative.   Respiratory: Positive for shortness of breath.   Cardiovascular: Negative.   Gastrointestinal: Negative.   Musculoskeletal: Positive for back pain.  Neurological: Negative.   Psychiatric/Behavioral: Negative.   All other systems reviewed and are negative.    PHYSICAL  EXAM: VS:  BP (!) 160/68 (BP Location: Left Arm, Patient Position: Sitting, Cuff Size: Normal)   Pulse 78   Temp (!) 97.4 F (36.3 C)   Ht 5\' 2"  (1.575 m)   Wt 144 lb 8 oz (65.5 kg)   BMI 26.43 kg/m  , BMI Body mass index is 26.43 kg/m. Constitutional:  oriented to person, place, and time. No distress.  HENT:  Head: Grossly normal Eyes:  no discharge. No scleral icterus.  Neck: Unable to estimate JVD, no carotid bruits  Cardiovascular: Regular rate and rhythm, no murmurs appreciated Trace pitting lower extremity edema above the ankles Pulmonary/Chest: Moderately decreased breath sounds throughout Abdominal: Soft.  no distension.  no tenderness.  Musculoskeletal: Normal range of motion Neurological:  normal muscle tone. Coordination normal. No atrophy Skin: Skin warm and dry Psychiatric: normal affect, pleasant   Recent Labs: 03/19/2019: BUN 37; Creatinine, Ser 1.01; Hemoglobin 10.5; Platelets 190; Potassium 3.9; Sodium 141    Lipid Panel Lab Results  Component Value Date   CHOL 132 06/24/2017   HDL 44.80 06/24/2017   LDLCALC UNABLE TO  CALCULATE IF TRIGLYCERIDE OVER 400 mg/dL 12/31/2014   TRIG (H) 06/24/2017    1031.0 Triglyceride is over 400; calculations on Lipids are invalid.      Wt Readings from Last 3 Encounters:  05/18/19 144 lb 8 oz (65.5 kg)  05/05/19 141 lb (64 kg)  03/31/19 142 lb 1.6 oz (64.5 kg)      ASSESSMENT AND PLAN:  Chronic atrial fibrillation ([place]) - Plan: EKG 12-Lead rate relatively well-controlled Tolerating anticoagulation On calcium channel blockers, no changes to his medications  Pulmonary hypertension Progressive climbing pressures from 34 up to 50 now 68 on echocardiogram Continued worsening shortness of breath over the past several years Participating in cardiac rehab with some improvements, Lasix 40 daily with climbing BUN though stable -Discussion with wife, wondering if we are doing everything we can do Suggestively place a referral to pulmonary hypertension clinic in [place] for further discussion Unclear if he is a candidate for sildenafil, other more fast modalities No prior right heart catheterization performed for confirmation -Pulmonary work-up appears remote with PFTs done 2012  Coronary artery disease of native artery of native heart with stable angina pectoris ([place]) - Plan: EKG 12-Lead Negative stress test earlier 2020 Denies any anginal symptoms but he does have chronic shortness of breath  Chronic diastolic HF (heart failure) (HCC) Lasix 40 daily, no changes made Likely exacerbated by atrial fibrillation, MR  Bilateral carotid artery stenosis Continue aggressive cholesterol management Prior triglycerides markedly elevated, will need repeat  Pulmonary emphysema, unspecified emphysema type ([place]) Followed by pulmonary  Disposition:   F/U  6 months  Discussed referral to pulmonary hypertension clinic  Total encounter time more than 40 minutes  Greater than 50% was spent in counseling and coordination of care with the patient    No orders of the defined  types were placed in this encounter.    Signed, Esmond Plants, M.D., Ph.D. 05/18/2019  [place], [place]

## 2019-05-16 ENCOUNTER — Other Ambulatory Visit: Payer: Self-pay | Admitting: Cardiovascular Disease

## 2019-05-18 ENCOUNTER — Other Ambulatory Visit: Payer: Self-pay

## 2019-05-18 ENCOUNTER — Ambulatory Visit: Payer: PPO | Admitting: Cardiovascular Disease

## 2019-05-18 ENCOUNTER — Encounter: Payer: Self-pay | Admitting: Cardiovascular Disease

## 2019-05-18 VITALS — BP 140/68 | HR 78 | Temp 97.4°F | Ht 62.0 in | Wt 144.5 lb

## 2019-05-18 DIAGNOSIS — I4821 Permanent atrial fibrillation: Secondary | ICD-10-CM

## 2019-05-18 DIAGNOSIS — I25119 Atherosclerotic heart disease of native coronary artery with unspecified angina pectoris: Secondary | ICD-10-CM

## 2019-05-18 DIAGNOSIS — I2721 Secondary pulmonary arterial hypertension: Secondary | ICD-10-CM | POA: Diagnosis not present

## 2019-05-18 DIAGNOSIS — I340 Nonrheumatic mitral (valve) insufficiency: Secondary | ICD-10-CM

## 2019-05-18 DIAGNOSIS — I5042 Chronic combined systolic (congestive) and diastolic (congestive) heart failure: Secondary | ICD-10-CM

## 2019-05-18 DIAGNOSIS — I10 Essential (primary) hypertension: Secondary | ICD-10-CM | POA: Diagnosis not present

## 2019-05-18 NOTE — Telephone Encounter (Signed)
Please review for refill of Eliquis. Thank you!  

## 2019-05-18 NOTE — Telephone Encounter (Signed)
Pt's age 85, wt 64 kg, SCr 1.01, CrCl 47.52, last ov w/ Ignacia Bayley, NP 03/19/19.

## 2019-05-18 NOTE — Addendum Note (Signed)
Addended by: Valora Corporal on: 05/18/2019 03:41 PM   Modules accepted: Orders

## 2019-05-18 NOTE — Patient Instructions (Addendum)
Ask about forever fit  Referral to pulmonary HTN clinic, Dr. Haroldine Laws Ask about sildenafil or other medications to drop the right heart pressures   Medication Instructions:  No changes  If you need a refill on your cardiac medications before your next appointment, please call your pharmacy.    Lab work: No new labs needed   If you have labs (blood work) drawn today and your tests are completely normal, you will receive your results only by: Marland Kitchen MyChart Message (if you have MyChart) OR . A paper copy in the mail If you have any lab test that is abnormal or we need to change your treatment, we will call you to review the results.   Testing/Procedures: No new testing needed   Follow-Up: At [facility], you and your health needs are our priority.  As part of our continuing mission to provide you with exceptional heart care, we have created designated Provider Care Teams.  These Care Teams include your primary Cardiologist (physician) and Advanced Practice Providers (APPs -  Physician Assistants and Nurse Practitioners) who all work together to provide you with the care you need, when you need it.  . You will need a follow up appointment in 6 months   . Providers on your designated Care Team:   . [name], NP . Christell Faith, PA-C . Marrianne Mood, PA-C  Any Other Special Instructions Will Be Listed Below (If Applicable).  For educational health videos Log in to : [email or web address] Or : [email or web address], password : [email or web address]

## 2019-05-19 ENCOUNTER — Encounter: Payer: PPO | Admitting: *Deleted

## 2019-05-19 DIAGNOSIS — I5032 Chronic diastolic (congestive) heart failure: Secondary | ICD-10-CM

## 2019-05-19 DIAGNOSIS — I5030 Unspecified diastolic (congestive) heart failure: Secondary | ICD-10-CM | POA: Diagnosis not present

## 2019-05-19 NOTE — Progress Notes (Signed)
Daily Session Note  Patient Details  Name: Eduardo [name] MRN: [id number] Date of Birth: June 16, 1930 Referring Provider:     Pulmonary Rehab from 03/31/2019 in [facility] Cardiac and Pulmonary Rehab  Referring Provider  [place]      Encounter Date: 05/19/2019  Check In: Session Check In - 05/19/19 0820      Check-In   Supervising physician immediately available to respond to emergencies  See telemetry face sheet for immediately available ER MD    Location  [facility]-Cardiac & Pulmonary Rehab    Staff Present  Heath Lark, RN, BSN, CCRP;Joseph Hood RCP,RRT,BSRT;Amanda Oletta Darter, [place], ACSM CEP, Exercise Physiologist    Virtual Visit  No    Medication changes reported      No    Fall or balance concerns reported     No    Warm-up and Cool-down  Performed on first and last piece of equipment    Resistance Training Performed  Yes    VAD Patient?  No    PAD/SET Patient?  No      Pain Assessment   Currently in Pain?  No/denies          Social History   Tobacco Use  Smoking Status Former Smoker  . Packs/day: 2.50  . Years: 30.00  . Pack years: 75.00  . Types: Cigarettes  . Quit date: 06/11/1978  . Years since quitting: 40.9  Smokeless Tobacco Never Used    Goals Met:  Proper associated with RPD/PD & O2 Sat Independence with exercise equipment Exercise tolerated well No report of cardiac concerns or symptoms  Goals Unmet:  Not Applicable  Comments: Pt able to follow exercise prescription today without complaint.  Will continue to monitor for progression.    Dr. Emily Filbert is Medical Director for [place] and [name].

## 2019-05-21 ENCOUNTER — Other Ambulatory Visit: Payer: Self-pay

## 2019-05-21 ENCOUNTER — Encounter: Payer: PPO | Admitting: *Deleted

## 2019-05-21 DIAGNOSIS — I5032 Chronic diastolic (congestive) heart failure: Secondary | ICD-10-CM

## 2019-05-21 DIAGNOSIS — I5030 Unspecified diastolic (congestive) heart failure: Secondary | ICD-10-CM | POA: Diagnosis not present

## 2019-05-21 NOTE — Progress Notes (Signed)
Daily Session Note  Patient Details  Name: Eduardo [name] MRN: [id number] Date of Birth: 02-09-1931 Referring Provider:     Pulmonary Rehab from 03/31/2019 in [facility] Cardiac and Pulmonary Rehab  Referring Provider  [place]      Encounter Date: 05/21/2019  Check In: Session Check In - 05/21/19 7703      Check-In   Supervising physician immediately available to respond to emergencies  See telemetry face sheet for immediately available ER MD    Location  [facility]-Cardiac & Pulmonary Rehab    Staff Present  Heath Lark, RN, BSN, CCRP;Jeanna Durrell BS, Exercise Physiologist;Amanda Oletta Darter, BA, ACSM CEP, Exercise Physiologist    Virtual Visit  No    Medication changes reported      No    Fall or balance concerns reported     No    Warm-up and Cool-down  Performed on first and last piece of equipment    Resistance Training Performed  Yes    VAD Patient?  No    PAD/SET Patient?  No      Pain Assessment   Currently in Pain?  No/denies          Social History   Tobacco Use  Smoking Status Former Smoker  . Packs/day: 2.50  . Years: 30.00  . Pack years: 75.00  . Types: Cigarettes  . Quit date: 06/11/1978  . Years since quitting: 40.9  Smokeless Tobacco Never Used    Goals Met:  Proper associated with RPD/PD & O2 Sat Independence with exercise equipment Exercise tolerated well No report of cardiac concerns or symptoms  Goals Unmet:  Not Applicable  Comments: Pt able to follow exercise prescription today without complaint.  Will continue to monitor for progression.    Dr. Emily Filbert is Medical Director for [place] and [name].

## 2019-05-22 ENCOUNTER — Encounter: Payer: PPO | Admitting: *Deleted

## 2019-05-22 DIAGNOSIS — I5030 Unspecified diastolic (congestive) heart failure: Secondary | ICD-10-CM | POA: Diagnosis not present

## 2019-05-22 DIAGNOSIS — I5032 Chronic diastolic (congestive) heart failure: Secondary | ICD-10-CM

## 2019-05-22 NOTE — Progress Notes (Signed)
Daily Session Note  Patient Details  Name: Eduardo [name] MRN: [id number] Date of Birth: [birth year] Referring Provider:     Pulmonary Rehab from 03/31/2019 in [facility] Cardiac and Pulmonary Rehab  Referring Provider  [name]      Encounter Date: 05/22/2019  Check In: Session Check In - 05/22/19 1100      Check-In   Supervising physician immediately available to respond to emergencies  See telemetry face sheet for immediately available ER MD    Location  [facility]-Cardiac & Pulmonary Rehab    Staff Present  Renita Papa, RN BSN;Jessica [place], MA, RCEP, CCRP, CCET;Amanda Sommer, [place], ACSM CEP, Exercise Physiologist    Virtual Visit  No    Medication changes reported      No    Fall or balance concerns reported     No    Warm-up and Cool-down  Performed on first and last piece of equipment    Resistance Training Performed  Yes    VAD Patient?  No    PAD/SET Patient?  No      Pain Assessment   Currently in Pain?  No/denies          Social History   Tobacco Use  Smoking Status Former Smoker  . Packs/day: 2.50  . Years: 30.00  . Pack years: 75.00  . Types: Cigarettes  . Quit date: 06/11/1978  . Years since quitting: 40.9  Smokeless Tobacco Never Used    Goals Met:  Independence with exercise equipment Exercise tolerated well No report of cardiac concerns or symptoms Strength training completed today  Goals Unmet:  Not Applicable  Comments: Pt able to follow exercise prescription today without complaint.  Will continue to monitor for progression.    Dr. Emily Filbert is Medical Director for [place] and [name].

## 2019-05-26 ENCOUNTER — Encounter: Payer: PPO | Admitting: *Deleted

## 2019-05-26 ENCOUNTER — Other Ambulatory Visit: Payer: Self-pay

## 2019-05-26 DIAGNOSIS — I5032 Chronic diastolic (congestive) heart failure: Secondary | ICD-10-CM

## 2019-05-26 DIAGNOSIS — I5030 Unspecified diastolic (congestive) heart failure: Secondary | ICD-10-CM | POA: Diagnosis not present

## 2019-05-26 NOTE — Progress Notes (Signed)
Daily Session Note  Patient Details  Name: DERRECK WILTSEY MRN: [id number] Date of Birth: 09-20-1930 Referring Provider:     Pulmonary Rehab from 03/31/2019 in [facility] Cardiac and Pulmonary Rehab  Referring Provider  [place]      Encounter Date: 05/26/2019  Check In: Session Check In - 05/26/19 0045      Check-In   Supervising physician immediately available to respond to emergencies  See telemetry face sheet for immediately available ER MD    Location  [facility]-Cardiac & Pulmonary Rehab    Staff Present  Heath Lark, RN, BSN, Lance Sell, BA, ACSM CEP, Exercise Physiologist;Joseph Hood RCP,RRT,BSRT    Virtual Visit  No    Medication changes reported      No    Fall or balance concerns reported     No    Warm-up and Cool-down  Performed on first and last piece of equipment    Resistance Training Performed  Yes    VAD Patient?  No    PAD/SET Patient?  No      Pain Assessment   Currently in Pain?  No/denies          Social History   Tobacco Use  Smoking Status Former Smoker  . Packs/day: 2.50  . Years: 30.00  . Pack years: 75.00  . Types: Cigarettes  . Quit date: 06/11/1978  . Years since quitting: 40.9  Smokeless Tobacco Never Used    Goals Met:  Proper associated with RPD/PD & O2 Sat Independence with exercise equipment Exercise tolerated well No report of cardiac concerns or symptoms  Goals Unmet:  Not Applicable  Comments: Pt able to follow exercise prescription today without complaint.  Will continue to monitor for progression.    Dr. Emily Filbert is Medical Director for [place] and [name].

## 2019-05-29 ENCOUNTER — Other Ambulatory Visit: Payer: Self-pay

## 2019-05-29 ENCOUNTER — Encounter: Payer: PPO | Admitting: *Deleted

## 2019-05-29 DIAGNOSIS — I5032 Chronic diastolic (congestive) heart failure: Secondary | ICD-10-CM

## 2019-05-29 DIAGNOSIS — I5030 Unspecified diastolic (congestive) heart failure: Secondary | ICD-10-CM | POA: Diagnosis not present

## 2019-05-29 NOTE — Progress Notes (Signed)
Daily Session Note  Patient Details  Name: Eduardo [name] MRN: [id number] Date of Birth: 02-Apr-1931 Referring Provider:     Pulmonary Rehab from 03/31/2019 in [facility] Cardiac and Pulmonary Rehab  Referring Provider  [place]      Encounter Date: 05/29/2019  Check In: Session Check In - 05/29/19 1105      Check-In   Supervising physician immediately available to respond to emergencies  See telemetry face sheet for immediately available ER MD    Location  [facility]-Cardiac & Pulmonary Rehab    Staff Present  Renita Papa, RN BSN;Jessica Luan [place], MA, RCEP, CCRP, CCET;Joseph [place] RCP,RRT,BSRT    Virtual Visit  No    Medication changes reported      No    Fall or balance concerns reported     No    Warm-up and Cool-down  Performed on first and last piece of equipment    Resistance Training Performed  Yes    VAD Patient?  No    PAD/SET Patient?  No      Pain Assessment   Currently in Pain?  No/denies          Social History   Tobacco Use  Smoking Status Former Smoker  . Packs/day: 2.50  . Years: 30.00  . Pack years: 75.00  . Types: Cigarettes  . Quit date: 06/11/1978  . Years since quitting: 40.9  Smokeless Tobacco Never Used    Goals Met:  Independence with exercise equipment Exercise tolerated well No report of cardiac concerns or symptoms Strength training completed today  Goals Unmet:  Not Applicable  Comments: Pt able to follow exercise prescription today without complaint.  Will continue to monitor for progression.    Dr. Emily Filbert is Medical Director for [place] and [name].

## 2019-05-30 ENCOUNTER — Other Ambulatory Visit: Payer: Self-pay | Admitting: Emergency Medicine

## 2019-06-02 ENCOUNTER — Other Ambulatory Visit: Payer: Self-pay

## 2019-06-02 ENCOUNTER — Encounter: Payer: PPO | Admitting: *Deleted

## 2019-06-02 DIAGNOSIS — I5030 Unspecified diastolic (congestive) heart failure: Secondary | ICD-10-CM | POA: Diagnosis not present

## 2019-06-02 DIAGNOSIS — I5032 Chronic diastolic (congestive) heart failure: Secondary | ICD-10-CM

## 2019-06-02 NOTE — Progress Notes (Signed)
Daily Session Note  Patient Details  Name: Eduardo [name] MRN: [id number] Date of Birth: 1931/05/02 Referring Provider:     Pulmonary Rehab from 03/31/2019 in [facility] Cardiac and Pulmonary Rehab  Referring Provider  [place]      Encounter Date: 06/02/2019  Check In: Session Check In - 06/02/19 0905      Check-In   Supervising physician immediately available to respond to emergencies  See telemetry face sheet for immediately available ER MD    Location  [facility]-Cardiac & Pulmonary Rehab    Staff Present  Heath Lark, RN, BSN, CCRP;Jessica [address], MA, RCEP, CCRP, [place], [place], ACSM CEP, Exercise Physiologist    Virtual Visit  No    Medication changes reported      No    Fall or balance concerns reported     No    Warm-up and Cool-down  Performed on first and last piece of equipment    Resistance Training Performed  Yes    VAD Patient?  No    PAD/SET Patient?  No      Pain Assessment   Currently in Pain?  No/denies          Social History   Tobacco Use  Smoking Status Former Smoker  . Packs/day: 2.50  . Years: 30.00  . Pack years: 75.00  . Types: Cigarettes  . Quit date: 06/11/1978  . Years since quitting: 41.0  Smokeless Tobacco Never Used    Goals Met:  Proper associated with RPD/PD & O2 Sat Independence with exercise equipment Exercise tolerated well No report of cardiac concerns or symptoms  Goals Unmet:  Not Applicable  Comments: Pt able to follow exercise prescription today without complaint.  Will continue to monitor for progression.    Dr. Emily Filbert is Medical Director for [place] and [name].

## 2019-06-04 ENCOUNTER — Other Ambulatory Visit: Payer: Self-pay

## 2019-06-04 ENCOUNTER — Encounter: Payer: PPO | Admitting: *Deleted

## 2019-06-04 DIAGNOSIS — I5032 Chronic diastolic (congestive) heart failure: Secondary | ICD-10-CM

## 2019-06-04 DIAGNOSIS — I5030 Unspecified diastolic (congestive) heart failure: Secondary | ICD-10-CM | POA: Diagnosis not present

## 2019-06-04 NOTE — Progress Notes (Signed)
Daily Session Note  Patient Details  Name: EBONY RICKEL MRN: [id number] Date of Birth: 07/10/1930 Referring Provider:     Pulmonary Rehab from 03/31/2019 in [facility] Cardiac and Pulmonary Rehab  Referring Provider  [place]      Encounter Date: 06/04/2019  Check In: Session Check In - 06/04/19 0839      Check-In   Supervising physician immediately available to respond to emergencies  See telemetry face sheet for immediately available ER MD    Location  [facility]-Cardiac & Pulmonary Rehab    Staff Present  Heath Lark, RN, BSN, CCRP;Jeanna Durrell BS, Exercise Physiologist;Jessica [place], MA, RCEP, CCRP, CCET    Virtual Visit  No    Medication changes reported      No    Fall or balance concerns reported     No    Warm-up and Cool-down  Performed on first and last piece of equipment    Resistance Training Performed  No    VAD Patient?  No    PAD/SET Patient?  No      Pain Assessment   Currently in Pain?  No/denies          Social History   Tobacco Use  Smoking Status Former Smoker  . Packs/day: 2.50  . Years: 30.00  . Pack years: 75.00  . Types: Cigarettes  . Quit date: 06/11/1978  . Years since quitting: 41.0  Smokeless Tobacco Never Used    Goals Met:  Proper associated with RPD/PD & O2 Sat Independence with exercise equipment Exercise tolerated well No report of cardiac concerns or symptoms  Goals Unmet:  Not Applicable  Comments: Pt able to follow exercise prescription today without complaint.  Will continue to monitor for progression.    Dr. Emily Filbert is Medical Director for [place] and [name].

## 2019-06-08 ENCOUNTER — Encounter: Payer: PPO | Admitting: *Deleted

## 2019-06-08 ENCOUNTER — Other Ambulatory Visit: Payer: Self-pay

## 2019-06-08 DIAGNOSIS — I5030 Unspecified diastolic (congestive) heart failure: Secondary | ICD-10-CM | POA: Diagnosis not present

## 2019-06-08 DIAGNOSIS — I5032 Chronic diastolic (congestive) heart failure: Secondary | ICD-10-CM

## 2019-06-08 NOTE — Progress Notes (Signed)
Daily Session Note  Patient Details  Name: Eduardo [name] MRN: [id number] Date of Birth: 10/06/1930 Referring Provider:     Pulmonary Rehab from 03/31/2019 in [facility]  Referring Provider  [place]      Encounter Date: 06/08/2019  Check In: Session Check In - 06/08/19 0937      Check-In   Supervising physician immediately available to respond to emergencies  See telemetry face sheet for immediately available ER MD    Location  [facility]    Staff Present   , RN, BSN, CCRP;Jeanna Durrell BS, Exercise Physiologist;Joseph Hood RCP,RRT,BSRT    Virtual Visit  No    Medication changes reported      No    Fall or balance concerns reported     No    Warm-up and Cool-down  Performed on first and last piece of equipment    Resistance Training Performed  Yes    VAD Patient?  No    PAD/SET Patient?  No      Pain Assessment   Currently in Pain?  No/denies          Social History   Tobacco Use  Smoking Status Former Smoker  . Packs/day: 2.50  . Years: 30.00  . Pack years: 75.00  . Types: Cigarettes  . Quit date: 06/11/1978  . Years since quitting: 41.0  Smokeless Tobacco Never Used    Goals Met:  Proper associated with RPD/PD & O2 Sat Independence with exercise equipment Exercise tolerated well No report of cardiac concerns or symptoms  Goals Unmet:  Not Applicable  Comments: Pt able to follow exercise prescription today without complaint.  Will continue to monitor for progression.    Dr. Mark Miller is Medical Director for [facility] and [name]. 

## 2019-06-10 ENCOUNTER — Encounter: Payer: Self-pay | Admitting: *Deleted

## 2019-06-10 DIAGNOSIS — I5032 Chronic diastolic (congestive) heart failure: Secondary | ICD-10-CM

## 2019-06-10 NOTE — Progress Notes (Signed)
Pulmonary Individual Treatment Plan  Patient Details  Name: Eduardo [name] MRN: [id number] Date of Birth: 1930/06/29 Referring Provider:     Pulmonary Rehab from 03/31/2019 in [facility] Cardiac and Pulmonary Rehab  Referring Provider  [place]vak      Initial Encounter Date:    Pulmonary Rehab from 03/31/2019 in [facility] Cardiac and Pulmonary Rehab  Date  03/31/19      Visit Diagnosis: Heart failure, diastolic, chronic ([place])  Patient's Home Medications on Admission:  Current Outpatient Medications:  .  acetaminophen (TYLENOL) 650 MG CR tablet, Take 1,300 mg by mouth 2 (two) times daily., Disp: , Rfl:  .  albuterol (PROVENTIL) (2.5 MG/3ML) 0.083% nebulizer solution, Use 1 vial in nebulizer every 4 hours as needed for wheezing or shortness of breath, Disp: 270 mL, Rfl: 2 .  ALPRAZolam (XANAX) 0.25 MG tablet, Take 1 tablet (0.25 mg total) by mouth 2 (two) times daily as needed for anxiety., Disp: 20 tablet, Rfl: 0 .  atorvastatin (LIPITOR) 80 MG tablet, Take 1 tablet (80 mg total) by mouth daily., Disp: 90 tablet, Rfl: 1 .  B Complex-C (SUPER B COMPLEX PO), Take 1 tablet by mouth at bedtime. , Disp: , Rfl:  .  cetirizine (ZYRTEC) 10 MG tablet, Take 10 mg by mouth at bedtime. , Disp: , Rfl:  .  cholecalciferol (VITAMIN D) 1000 units tablet, Take 1,000 Units by mouth daily., Disp: , Rfl:  .  ELIQUIS 5 MG TABS tablet, TAKE ONE TABLET BY MOUTH TWICE DAILY , Disp: 180 tablet, Rfl: 0 .  escitalopram (LEXAPRO) 20 MG tablet, Take 20 mg by mouth at bedtime. , Disp: , Rfl:  .  fenofibrate 160 MG tablet, TAKE ONE TABLET BY MOUTH DAILY AT BEDTIME , Disp: 90 tablet, Rfl: 3 .  furosemide (LASIX) 40 MG tablet, Take 1 tablet (40 mg total) by mouth daily., Disp: 90 tablet, Rfl: 3 .  hydrALAZINE (APRESOLINE) 10 MG tablet, TAKE ONE TABLET BY MOUTH THREE TIMES DAILY , Disp: 270 tablet, Rfl: 0 .  ipratropium (ATROVENT) 0.03 % nasal spray, PLACE 2 SPRAYS INTO BOTH NOSTRILS AT BEDTIME, Disp: 30 mL, Rfl: 2 .   lamoTRIgine (LAMICTAL) 150 MG tablet, Take 150 mg by mouth at bedtime. Reported on 08/14/2015, Disp: , Rfl:  .  montelukast (SINGULAIR) 10 MG tablet, TAKE ONE TABLET BY MOUTH DAILY AT BEDTIME , Disp: 90 tablet, Rfl: 0 .  Multiple Vitamin (MULTIVITAMIN) tablet, Take 1 tablet by mouth at bedtime. , Disp: , Rfl:  .  nitroGLYCERIN (NITROSTAT) 0.4 MG SL tablet, Place 1 tablet (0.4 mg total) under the tongue every 5 (five) minutes as needed for chest pain., Disp: 90 tablet, Rfl: 3 .  pantoprazole (PROTONIX) 40 MG tablet, TAKE 1 TABLET BY MOUTH DAILY BEFORE BREAKFAST., Disp: 90 tablet, Rfl: 3 .  Tiotropium Bromide-Olodaterol (STIOLTO RESPIMAT) 2.5-2.5 MCG/ACT AERS, Inhale 2 puffs into the lungs daily., Disp: 3 Inhaler, Rfl: 1 .  triamcinolone cream (KENALOG) 0.1 %, Apply 1 application topically 2 (two) times daily as needed (for skin). , Disp: , Rfl:  .  verapamil (CALAN-SR) 240 MG CR tablet, TAKE ONE TABLET BY MOUTH AT BEDTIME, Disp: 90 tablet, Rfl: 0  Past Medical History: Past Medical History:  Diagnosis Date  . Allergy   . Anemia 2012, 08/2015   chronic. Acute due to large hematoma 2013. 2017: due to gastric AVM bleeding.   . Anxiety   . CAD (coronary artery disease)    a. 06/2010 Cath: LM nl, LAD 50p/m, LCX 19m  RCA 50p/m -->catheter dissection but good flow, 70d, RPDA 50-70;  b. 06/2010 Relook cath due to bradycardia and inf ST elev: RCA dissection @ ostium extending into coronary cusp and prox RCA, Ao dissection-->less staining than previously-->Med Rx; c. 08/2018 MV: No ischemia.  . Chronic combined systolic (congestive) and diastolic (congestive) heart failure ([place])    a. 12/2014 Echo: EF 60-65%; b. 07/2016 Echo: EF 45-50%, mild LVH. Mild AS/MS. Mod-sev MR. Mild to mod TR. PASP 35mHg; c. 08/2018 Echo: EF 55-60%, RVSP 68.645mg. Sev dil LA, mod dil RA. at least mod MR, mild to mod TR. Mild AS.  . Marland KitchenOPD (chronic obstructive pulmonary disease) ([place]   exertional dyspnea  . Gastric angiodysplasia with  hemorrhage 08/2015  . Hx of colonic polyps 2001, 2011   adenomatous 2001. HP 2001, 2011.  . Marland Kitchenyperlipidemia   . Mitral regurgitation    a. 07/2016 Echo: Mod to sev MR; b. 08/2018 Echo: at least moderate MR.  . Myocardial infarction ([place]/2012.  12/2011.   "twice; 1 day apart; during/after cath". NQMI in setting severe anemia 2013.   . Marland Kitcheneuropathy 2014   in feet, likely due to spinal stenosis, DDD, HNP  . Osteoarthritis 2002   spinal stenosis, spondylolisthesis, disc protrusion multilevel in lumbar spine  . Permanent atrial fibrillation ([facility]2012   a.  CHA2DS2VASc = 6-->eliquis;  b. 12/2015 Echo: EF 60-65%, no rwma, LVH, mild AS/MS/MR, sev dil LA, mild TR, PASP 3433m.  . Stroke ([place]016  . Upper GI bleed 08/2015   EGD: 2 small gastric AVMs, 1 actively bleeding.  Both treated with APC ablation, clipping of the bleeder    Tobacco Use: Social History   Tobacco Use  Smoking Status Former Smoker  . Packs/day: 2.50  . Years: 30.00  . Pack years: 75.00  . Types: Cigarettes  . Quit date: 06/11/1978  . 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 for [facility] Cardiac and Pulmonary Rehab Latest Ref Rng & Units 05/18/2016 07/13/2016 07/14/2016 07/18/2016 06/24/2017   Cholestrol 0 - 200 mg/dL 134 - - - 132   LDLCALC 0 - 99 mg/dL - - - - -   LDLDIRECT mg/dL 65.0 - - - 69.0   HDL >39.00 mg/dL 42.80 - - - 44.80   Trlycerides 0.0 - 149.0 mg/dL 952.0 Triglyceride is over 400; calculations on Lipids are invalid.(H) - - - 1031.0 Triglyceride is over 400; calculations on Lipids are invalid.(H)   Hemoglobin A1c 4.8 - 5.6 % - - - 5.4 -   PHART 7.350 - 7.450 - - 7.403 - -   PCO2ART 32.0 - 48.0 mmHg - - 33.5 - -   HCO3 20.0 - 28.0 mmol/L - - 20.9 - -   TCO2 0 - 100 mmol/L - 24 22 - -   ACIDBASEDEF 0.0 - 2.0 mmol/L - - 3.0(H) - -   O2SAT % - - 98.0 - -       Pulmonary Assessment Scores:   UCSD: Self-administered rating of dyspnea associated with  activities of daily living (ADLs) 6-point scale (0 = "not at all" to 5 = "maximal or unable to do because of breathlessness")  Scoring Scores range from 0 to 120.  Minimally important difference is 5 units  CAT: CAT can identify the health impairment of COPD patients and is better correlated with disease progression.  CAT has a scoring range of zero to 40. The CAT score  is classified into four groups of low (less than 10), medium (10 - 20), high (21-30) and very high (31-40) based on the impact level of disease on health status. A CAT score over 10 suggests significant symptoms.  A worsening CAT score could be explained by an exacerbation, poor medication adherence, poor inhaler technique, or progression of COPD or comorbid conditions.  CAT MCID is 2 points  mMRC: mMRC (Modified Medical Research Council) Dyspnea Scale is used to assess the degree of baseline functional disability in patients of respiratory disease due to dyspnea. No minimal important difference is established. A decrease in score of 1 point or greater is considered a positive change.   Pulmonary Function Assessment:   Exercise Target Goals: Exercise Program Goal: Individual exercise prescription set using results from initial 6 min walk test and THRR while considering  patient's activity barriers and safety.   Exercise Prescription Goal: Initial exercise prescription builds to 30-45 minutes a day of aerobic activity, 2-3 days per week.  Home exercise guidelines will be given to patient during program as part of exercise prescription that the participant will acknowledge.  Activity Barriers & Risk Stratification:   6 Minute Walk: 6 Minute Walk    Row Name 03/31/19 1354         6 Minute Walk   Phase  Initial     Distance  400 feet     Walk Time  4.35 minutes     # of Rest Breaks  1     MPH  0.75     METS  0.16     RPE  11     Perceived Dyspnea   2     VO2 Peak  0.57     Symptoms  No     Resting HR  66 bpm      Resting BP  132/64     Resting Oxygen Saturation   97 %     Exercise Oxygen Saturation  during 6 min walk  91 %     Max Ex. HR  87 bpm     Max Ex. BP  150/60     2 Minute Post BP  118/60       Interval HR   1 Minute HR  70     2 Minute HR  87     4 Minute HR  81     5 Minute HR  81     6 Minute HR  80     2 Minute Post HR  72     Interval Heart Rate?  Yes       Interval Oxygen   Interval Oxygen?  Yes     Baseline Oxygen Saturation %  97 %     1 Minute Oxygen Saturation %  90 %     1 Minute Liters of Oxygen  0 L     2 Minute Oxygen Saturation %  90 %     2 Minute Liters of Oxygen  0 L     3 Minute Liters of Oxygen  0 L     4 Minute Oxygen Saturation %  92 %     4 Minute Liters of Oxygen  0 L     5 Minute Oxygen Saturation %  91 %     5 Minute Liters of Oxygen  0 L     6 Minute Oxygen Saturation %  94 %     6 Minute Liters of Oxygen  0  L     2 Minute Post Oxygen Saturation %  94 %     2 Minute Post Liters of Oxygen  0 L       Oxygen Initial Assessment: Oxygen Initial Assessment - 03/26/19 1639      Home Oxygen   Home Oxygen Device  None    Sleep Oxygen Prescription  None    Home Exercise Oxygen Prescription  None    Home at Rest Exercise Oxygen Prescription  None      Initial 6 min Walk   Oxygen Used  None      Program Oxygen Prescription   Program Oxygen Prescription  None      Intervention   Short Term Goals  To learn and understand importance of monitoring SPO2 with pulse oximeter and demonstrate accurate use of the pulse oximeter.;To learn and understand importance of maintaining oxygen saturations>88%;To learn and demonstrate proper pursed lip breathing techniques or other breathing techniques.;To learn and demonstrate proper use of respiratory medications    Long  Term Goals  Verbalizes importance of monitoring SPO2 with pulse oximeter and return demonstration;Maintenance of O2 saturations>88%;Exhibits proper breathing techniques, such as pursed lip  breathing or other method taught during program session;Compliance with respiratory medication;Demonstrates proper use of MDI's       Oxygen Re-Evaluation: Oxygen Re-Evaluation    Row Name 04/21/19 0858             Program Oxygen Prescription   Program Oxygen Prescription  None         Home Oxygen   Home Oxygen Device  None       Sleep Oxygen Prescription  None       Home Exercise Oxygen Prescription  None       Home at Rest Exercise Oxygen Prescription  None         Goals/Expected Outcomes   Short Term Goals  To learn and demonstrate proper use of respiratory medications;To learn and understand importance of maintaining oxygen saturations>88%;To learn and demonstrate proper pursed lip breathing techniques or other breathing techniques.;To learn and understand importance of monitoring SPO2 with pulse oximeter and demonstrate accurate use of the pulse oximeter.       Long  Term Goals  Verbalizes importance of monitoring SPO2 with pulse oximeter and return demonstration;Maintenance of O2 saturations>88%;Exhibits proper breathing techniques, such as pursed lip breathing or other method taught during program session;Compliance with respiratory medication;Demonstrates proper use of MDI's       Comments  Alcus has been doing well in Rehab and has kept his oxygen level up while exercising. He was 92 percent today while on the NuStep. He has a pulse oximeter at home to monitor his oxygen and heart rate. He is taking his breathing medications and does not have any questions about them. Itamar has been using PLB techniques in the gym and at home when needed. Informed him that he needs to be 88 percent and above when exercising.       Goals/Expected Outcomes  Short:work on PLB more in rehab. Long: become independent with PLB.          Oxygen Discharge (Final Oxygen Re-Evaluation): Oxygen Re-Evaluation - 04/21/19 0858      Program Oxygen Prescription   Program Oxygen Prescription  None       Home Oxygen   Home Oxygen Device  None    Sleep Oxygen Prescription  None    Home Exercise Oxygen Prescription  None    Home at  Rest Exercise Oxygen Prescription  None      Goals/Expected Outcomes   Short Term Goals  To learn and demonstrate proper use of respiratory medications;To learn and understand importance of maintaining oxygen saturations>88%;To learn and demonstrate proper pursed lip breathing techniques or other breathing techniques.;To learn and understand importance of monitoring SPO2 with pulse oximeter and demonstrate accurate use of the pulse oximeter.    Long  Term Goals  Verbalizes importance of monitoring SPO2 with pulse oximeter and return demonstration;Maintenance of O2 saturations>88%;Exhibits proper breathing techniques, such as pursed lip breathing or other method taught during program session;Compliance with respiratory medication;Demonstrates proper use of MDI's    Comments  Kara has been doing well in Rehab and has kept his oxygen level up while exercising. He was 92 percent today while on the NuStep. He has a pulse oximeter at home to monitor his oxygen and heart rate. He is taking his breathing medications and does not have any questions about them. Davarius has been using PLB techniques in the gym and at home when needed. Informed him that he needs to be 88 percent and above when exercising.    Goals/Expected Outcomes  Short:work on PLB more in rehab. Long: become independent with PLB.       Initial Exercise Prescription: Initial Exercise Prescription - 03/31/19 1300      Date of Initial Exercise RX and Referring Provider   Date  03/31/19    Referring Provider  Letvak      Treadmill   MPH  0.5    Grade  0    Minutes  2    METs  0.5      Recumbant Bike   Level  1    RPM  50    Watts  15    Minutes  10    METs  1      NuStep   Level  1    SPM  80    Minutes  15    METs  1.5      REL-XR   Level  1    Watts  20    Speed  30    Minutes  10    METs   1.5      T5 Nustep   Level  1    SPM  50    Minutes  5    METs  1      Biostep-RELP   Level  1    SPM  50    Minutes  5    METs  1      Prescription Details   Duration  Progress to 30 minutes of continuous aerobic without signs/symptoms of physical distress      Intensity   Ratings of Perceived Exertion  11-15    Perceived Dyspnea  0-4      Progression   Progression  Continue progressive overload as per policy without signs/symptoms or physical distress.      Resistance Training   Training Prescription  Yes    Weight  3    Reps  10-15       Perform Capillary Blood Glucose checks as needed.  Exercise Prescription Changes: Exercise Prescription Changes    Row Name 04/08/19 1300 04/14/19 0900 04/22/19 1200 05/04/19 0900 05/04/19 0932     Response to Exercise   Blood Pressure (Admit)  116/52  --  140/60  132/68  --   Blood Pressure (Exercise)  140/64  --  124/62  130/68  --   Blood Pressure (Exit)  136/74  --  148/60  128/60  --   Heart Rate (Admit)  70 bpm  --  72 bpm  68 bpm  --   Heart Rate (Exercise)  88 bpm  --  92 bpm  89 bpm  --   Heart Rate (Exit)  67 bpm  --  77 bpm  72 bpm  --   Oxygen Saturation (Admit)  93 %  --  94 %  92 %  --   Oxygen Saturation (Exercise)  92 %  --  91 %  90 %  --   Oxygen Saturation (Exit)  95 %  --  93 %  95 %  --   Rating of Perceived Exertion (Exercise)  13  --  11  13  --   Perceived Dyspnea (Exercise)  3  --  2  2  --   Symptoms  SOB  --  SOB  SOB  --   Duration  Continue with 30 min of aerobic exercise without signs/symptoms of physical distress.  --  Continue with 30 min of aerobic exercise without signs/symptoms of physical distress.  Continue with 30 min of aerobic exercise without signs/symptoms of physical distress.  --   Intensity  THRR unchanged  --  THRR unchanged  THRR unchanged  --     Progression   Progression  Continue to progress workloads to maintain intensity without signs/symptoms of physical distress.  --   Continue to progress workloads to maintain intensity without signs/symptoms of physical distress.  Continue to progress workloads to maintain intensity without signs/symptoms of physical distress.  --   Average METs  2.26  --  2.2  2.1  --     Resistance Training   Training Prescription  Yes  --  Yes  Yes  --   Weight  3 lbs  --  3 lb  3 lb  --   Reps  10-15  --  10-15  10-15  --     Interval Training   Interval Training  No  --  No  No  --     Recumbant Bike   Level  1  --  --  --  --   Watts  18  --  --  --  --   Minutes  15  --  --  --  --   METs  2.97  --  --  --  --     NuStep   Level  --  --  3  3  --   Minutes  --  --  30  30  --   METs  --  --  2.5  2.1  --     REL-XR   Level  1  --  --  --  --   Minutes  15  --  --  --  --     T5 Nustep   Level  1  --  1  --  --   Minutes  15  --  15  --  --   METs  1.8  --  2.2  --  --     Biostep-RELP   Level  1  --  --  3  --   Minutes  15  --  --  15  --   METs  2  --  --  2  --     Home Exercise Plan   Plans  to continue exercise at  --  Home (comment) looking for a Nustep for home  Home (comment) looking for a Nustep for home  --  Home (comment) looking for a Nustep for home   Frequency  --  Add 1 additional day to program exercise sessions.  Add 1 additional day to program exercise sessions.  --  Add 1 additional day to program exercise sessions.   Initial Home Exercises Provided  --  04/14/19  04/14/19  --  04/14/19   Row Name 05/21/19 1200 06/01/19 1400           Response to Exercise   Blood Pressure (Admit)  136/54  134/60      Blood Pressure (Exercise)  142/68  142/60      Blood Pressure (Exit)  --  130/56      Heart Rate (Admit)  67 bpm  76 bpm      Heart Rate (Exercise)  83 bpm  89 bpm      Heart Rate (Exit)  67 bpm  78 bpm      Oxygen Saturation (Admit)  93 %  97 %      Oxygen Saturation (Exercise)  93 %  93 %      Oxygen Saturation (Exit)  92 %  92 %      Rating of Perceived Exertion (Exercise)  15  15       Perceived Dyspnea (Exercise)  2  1      Duration  Progress to 30 minutes of  aerobic without signs/symptoms of physical distress  Continue with 30 min of aerobic exercise without signs/symptoms of physical distress.      Intensity  THRR unchanged  THRR unchanged        Progression   Progression  Continue to progress workloads to maintain intensity without signs/symptoms of physical distress.  Continue to progress workloads to maintain intensity without signs/symptoms of physical distress.      Average METs  2.4  2.17        Resistance Training   Training Prescription  Yes  Yes      Weight  5 lb  5 lb      Reps  10-15  10-15        Interval Training   Interval Training  No  No        NuStep   Level  3  3      Minutes  --  15      METs  --  2.6        T5 Nustep   Level  4  4      SPM  80  --      Minutes  15  15      METs  1.9  1.9        Biostep-RELP   Level  --  2      Minutes  --  15      METs  --  2        Home Exercise Plan   Plans to continue exercise at  Home (comment) looking for a Nustep for home  Home (comment) looking for a Nustep for home      Frequency  Add 1 additional day to program exercise sessions.  Add 1 additional day to program exercise sessions.      Initial Home Exercises Provided  04/14/19  04/14/19         Exercise Comments: Exercise Comments  [place] Name 04/02/19 0902           Exercise Comments  First full day of exercise!  Patient was oriented to gym and equipment including functions, settings, policies, and procedures.  Patient's individual exercise prescription and treatment plan were reviewed.  All starting workloads were established based on the results of the 6 minute walk test done at initial orientation visit.  The plan for exercise progression was also introduced and progression will be customized based on patient's performance and goals.          Exercise Goals and Review: Exercise Goals    Row Name 03/26/19 1639 03/31/19 1400            Exercise Goals   Increase Physical Activity  Yes  Yes      Intervention  Provide advice, education, support and counseling about physical activity/exercise needs.;Develop an individualized exercise prescription for aerobic and resistive training based on initial evaluation findings, risk stratification, comorbidities and participant's personal goals.  Provide advice, education, support and counseling about physical activity/exercise needs.;Develop an individualized exercise prescription for aerobic and resistive training based on initial evaluation findings, risk stratification, comorbidities and participant's personal goals.      Expected Outcomes  Short Term: Attend rehab on a regular basis to increase amount of physical activity.;Long Term: Add in home exercise to make exercise part of routine and to increase amount of physical activity.;Long Term: Exercising regularly at least 3-5 days a week.  Short Term: Attend rehab on a regular basis to increase amount of physical activity.;Long Term: Add in home exercise to make exercise part of routine and to increase amount of physical activity.;Long Term: Exercising regularly at least 3-5 days a week.      Increase Strength and Stamina  Yes  --      Intervention  Provide advice, education, support and counseling about physical activity/exercise needs.;Develop an individualized exercise prescription for aerobic and resistive training based on initial evaluation findings, risk stratification, comorbidities and participant's personal goals.  Provide advice, education, support and counseling about physical activity/exercise needs.;Develop an individualized exercise prescription for aerobic and resistive training based on initial evaluation findings, risk stratification, comorbidities and participant's personal goals.      Expected Outcomes  Short Term: Increase workloads from initial exercise prescription for resistance, speed, and METs.;Short Term:  Perform resistance training exercises routinely during rehab and add in resistance training at home;Long Term: Improve cardiorespiratory fitness, muscular endurance and strength as measured by increased METs and functional capacity (6MWT)  Short Term: Increase workloads from initial exercise prescription for resistance, speed, and METs.;Short Term: Perform resistance training exercises routinely during rehab and add in resistance training at home;Long Term: Improve cardiorespiratory fitness, muscular endurance and strength as measured by increased METs and functional capacity (6MWT)      Able to understand and use rate of perceived exertion (RPE) scale  Yes  Yes      Intervention  Provide education and explanation on how to use RPE scale  Provide education and explanation on how to use RPE scale      Expected Outcomes  Short Term: Able to use RPE daily in rehab to express subjective intensity level;Long Term:  Able to use RPE to guide intensity level when exercising independently  Short Term: Able to use RPE daily in rehab to express subjective intensity level;Long Term:  Able to use RPE to guide intensity level when exercising independently      Able to understand and use Dyspnea scale  Yes  Yes      Intervention  Provide education and explanation on how to use Dyspnea scale  Provide education and explanation on how to use Dyspnea scale      Expected Outcomes  Short Term: Able to use Dyspnea scale daily in rehab to express subjective sense of shortness of breath during exertion;Long Term: Able to use Dyspnea scale to guide intensity level when exercising independently  Short Term: Able to use Dyspnea scale daily in rehab to express subjective sense of shortness of breath during exertion;Long Term: Able to use Dyspnea scale to guide intensity level when exercising independently      Knowledge and understanding of Target Heart Rate Range (THRR)  Yes  Yes      Intervention  Provide education and explanation of  THRR including how the numbers were predicted and where they are located for reference  Provide education and explanation of THRR including how the numbers were predicted and where they are located for reference      Expected Outcomes  Short Term: Able to state/look up THRR;Short Term: Able to use daily as guideline for intensity in rehab;Long Term: Able to use THRR to govern intensity when exercising independently  Short Term: Able to state/look up THRR;Short Term: Able to use daily as guideline for intensity in rehab;Long Term: Able to use THRR to govern intensity when exercising independently      Able to check pulse independently  Yes  Yes      Intervention  Provide education and demonstration on how to check pulse in carotid and radial arteries.;Review the importance of being able to check your own pulse for safety during independent exercise  Provide education and demonstration on how to check pulse in carotid and radial arteries.;Review the importance of being able to check your own pulse for safety during independent exercise      Expected Outcomes  Short Term: Able to explain why pulse checking is important during independent exercise;Long Term: Able to check pulse independently and accurately  Short Term: Able to explain why pulse checking is important during independent exercise;Long Term: Able to check pulse independently and accurately      Understanding of Exercise Prescription  Yes  Yes      Intervention  Provide education, explanation, and written materials on patient's individual exercise prescription  Provide education, explanation, and written materials on patient's individual exercise prescription      Expected Outcomes  Short Term: Able to explain program exercise prescription;Long Term: Able to explain home exercise prescription to exercise independently  Short Term: Able to explain program exercise prescription;Long Term: Able to explain home exercise prescription to exercise  independently         Exercise Goals Re-Evaluation : Exercise Goals Re-Evaluation    Row Name 04/02/19 0902 04/08/19 1326 04/14/19 0942 04/21/19 0854 05/04/19 0925     Exercise Goal Re-Evaluation   Exercise Goals Review  Able to understand and use rate of perceived exertion (RPE) scale;Knowledge and understanding of Target Heart Rate Range (THRR);Understanding of Exercise Prescription  Increase Physical Activity;Increase Strength and Stamina;Understanding of Exercise Prescription  Increase Physical Activity;Increase Strength and Stamina;Able to understand and use rate of perceived exertion (RPE) scale;Knowledge and understanding of Target Heart Rate Range (THRR);Able to check pulse independently;Understanding of Exercise Prescription  Increase Physical Activity;Increase Strength and Stamina  Increase Physical Activity;Increase Strength and Stamina;Understanding of Exercise Prescription   Comments  Reviewed RPE scale, THR and program prescription with pt today.  Pt voiced understanding  and was given a copy of goals to take home.  Alix is off to a good start.  He has completed his first three days of exercise. He was able to get 2 METs on the BioStep. We will continue to monitor his progress.  Reviewed home exercise with pt today.  Pt plans to get a nustep for home for exercise.  Reviewed THR, pulse, RPE, sign and symptoms, NTG use, and when to call 911 or MD.  Also discussed weather considerations and indoor options.  Pt voiced understanding.  Aris has been working out in the [employer] regularly. He is still trying to find a new step for his home. He has been taking advantage of the programs that the [facility] offers. He is trying to exercise as much as he can without overdoing it. He has been enjoying exercise in the program and is doing well. Vu wants to have enough energy to be able to walk up a full flight of stairs.  Trent has been doing well in rehab.  He is on level 3 on the BioStep now!  We  will continue to monitor his progress.   Expected Outcomes  hort: Use RPE daily to regulate intensity. Long: Follow program prescription in THR.  Short: Continue to attend regularly.  Long: Continue to follow program prescription.  Short - do strength work at home until he can find nustep :Long - maintain exercise on his own  Short: be able to walk further. Long: maintain walking and exercise independently.  Short: Increase workloads.  Long: Continue to increase walking at home.   [place] Name 05/21/19 1248 06/01/19 1430           Exercise Goal Re-Evaluation   Exercise Goals Review  Increase Physical Activity;Increase Strength and Stamina;Able to understand and use rate of perceived exertion (RPE) scale;Able to understand and use Dyspnea scale;Knowledge and understanding of Target Heart Rate Range (THRR);Able to check pulse independently;Understanding of Exercise Prescription  Increase Physical Activity;Increase Strength and Stamina;Understanding of Exercise Prescription      Comments  Livingston has moved up to 5 lb for strength work.  He does do strength work at home.  Staff will monitor progress  Kourosh continues to do well in rehab.  He is doing well with his 30 min on the NuStep.  He is on level  3 on most of his pieces.  We will continue to monitor his progress.      Expected Outcomes  Short :  continue to attend consistently Long - build stamina overall  Short: Continue to improve stamina.  Long: Continue to exercise independently at home.         Discharge Exercise Prescription (Final Exercise Prescription Changes): Exercise Prescription Changes - 06/01/19 1400      Response to Exercise   Blood Pressure (Admit)  134/60    Blood Pressure (Exercise)  142/60    Blood Pressure (Exit)  130/56    Heart Rate (Admit)  76 bpm    Heart Rate (Exercise)  89 bpm    Heart Rate (Exit)  78 bpm    Oxygen Saturation (Admit)  97 %    Oxygen Saturation (Exercise)  93 %    Oxygen Saturation (Exit)  92 %     Rating of Perceived Exertion (Exercise)  15    Perceived Dyspnea (Exercise)  1    Duration  Continue with 30 min of aerobic exercise without signs/symptoms of physical distress.    Intensity  THRR unchanged  Progression   Progression  Continue to progress workloads to maintain intensity without signs/symptoms of physical distress.    Average METs  2.17      Resistance Training   Training Prescription  Yes    Weight  5 lb    Reps  10-15      Interval Training   Interval Training  No      NuStep   Level  3    Minutes  15    METs  2.6      T5 Nustep   Level  4    Minutes  15    METs  1.9      Biostep-RELP   Level  2    Minutes  15    METs  2      Home Exercise Plan   Plans to continue exercise at  Home (comment)   looking for a Nustep for home   Frequency  Add 1 additional day to program exercise sessions.    Initial Home Exercises Provided  04/14/19       Nutrition:  Target Goals: Understanding of nutrition guidelines, daily intake of sodium <1573m, cholesterol <2042m calories 30% from fat and 7% or less from saturated fats, daily to have 5 or more servings of fruits and vegetables.  Biometrics: Pre Biometrics - 03/31/19 1400      Pre Biometrics   Height  5' 2" (1.575 m)    Weight  142 lb 1.6 oz (64.5 kg)    BMI (Calculated)  25.98        Nutrition Therapy Plan and Nutrition Goals: Nutrition Therapy & Goals - 04/27/19 1516      Nutrition Therapy   Diet  Low Na, HH diet    Protein (specify units)  55g    Fiber  30 grams    Whole Grain Foods  3 servings    Saturated Fats  12 max. grams    Fruits and Vegetables  5 servings/day    Sodium  1.5 grams      Personal Nutrition Goals   Nutrition Goal  N/A    Comments  RD eval deferred at this time by wife TiOtila KluverWill confirm with pt and continue to check in during re-evaluations. Pt wife reports eating mediterranean diet; lots of fruits and vegetables, fish, lean protein, healthy fats, whole grains. Pt  wife reports that he could do better with sodium and water, but that their overall lifestyle is healthy. tips on how to optimize diet for overall health and providing any resources as well as tips and checking in as role of RD.       Nutrition Assessments: Nutrition Assessments - 03/31/19 1410      MEDFICTS Scores   Pre Score  42       Nutrition Goals Re-Evaluation:   Nutrition Goals Discharge (Final Nutrition Goals Re-Evaluation):   Psychosocial: Target Goals: Acknowledge presence or absence of significant depression and/or stress, maximize coping skills, provide positive support system. Participant is able to verbalize types and ability to use techniques and skills needed for reducing stress and depression.   Initial Review & Psychosocial Screening: Initial Psych Review & Screening - 03/26/19 1640      Initial Review   Current issues with  Current Psychotropic Meds;Current Anxiety/Panic      Family Dynamics   Good Support System?  Yes    Comments  He can look to his wife, daughter and son for support. He talks with a therapist about  his anxiety.      Barriers   Psychosocial barriers to participate in program  The patient should benefit from training in stress management and relaxation.      Screening Interventions   Interventions  Encouraged to exercise;To provide support and resources with identified psychosocial needs;Provide feedback about the scores to participant    Expected Outcomes  Short Term goal: Utilizing psychosocial counselor, staff and physician to assist with identification of specific Stressors or current issues interfering with healing process. Setting desired goal for each stressor or current issue identified.;Long Term Goal: Stressors or current issues are controlled or eliminated.;Long Term goal: The participant improves quality of Life and PHQ9 Scores as seen by post scores and/or verbalization of changes;Short Term goal: Identification and review with  participant of any Quality of Life or Depression concerns found by scoring the questionnaire.       Quality of Life Scores:  Scores of 19 and below usually indicate a poorer quality of life in these areas.  A difference of  2-3 points is a clinically meaningful difference.  A difference of 2-3 points in the total score of the Quality of Life Index has been associated with significant improvement in overall quality of life, self-image, physical symptoms, and general health in studies assessing change in quality of life.  PHQ-9: Recent Review Flowsheet Data    Depression screen [facility] 2/9 06/24/2017 05/29/2016   Decreased Interest 0 0   Down, Depressed, Hopeless 0 0   PHQ - 2 Score 0 0     Interpretation of Total Score  Total Score Depression Severity:  1-4 = Minimal depression, 5-9 = Mild depression, 10-14 = Moderate depression, 15-19 = Moderately severe depression, 20-27 = Severe depression   Psychosocial Evaluation and Intervention:   Psychosocial Re-Evaluation: Psychosocial Re-Evaluation    Row Name 04/21/19 4193 05/19/19 0845           Psychosocial Re-Evaluation   Current issues with  Current Depression;Current Psychotropic Meds;Current Anxiety/Panic  --      Comments  He has some anxiety with his health and when he gets short of breath. Alvin stays positive and wants to live as long as possible. He looks forward having breakfast daily and likes to watch [employer].  Zelma has no new stress.  He uses having breakfast and netflix to manage stress.      Expected Outcomes  Short: continue to exercise to reduce stress. Long: maintain exercise to keep stress at a minimum.  Short - continue to exercise Long - maintain positive attitude      Interventions  Encouraged to attend Pulmonary Rehabilitation for the exercise  --      Continue Psychosocial Services   Follow up required by staff  --         Psychosocial Discharge (Final Psychosocial Re-Evaluation): Psychosocial Re-Evaluation -  05/19/19 0845      Psychosocial Re-Evaluation   Comments  Amro has no new stress.  He uses having breakfast and netflix to manage stress.    Expected Outcomes  Short - continue to exercise Long - maintain positive attitude       Education: Education Goals: Education classes will be provided on a weekly basis, covering required topics. Participant will state understanding/return demonstration of topics presented.  Learning Barriers/Preferences: Learning Barriers/Preferences - 03/26/19 1642      Learning Barriers/Preferences   Learning Barriers  None    Learning Preferences  None       Education Topics:  Initial Evaluation Education: -  Verbal, written and demonstration of respiratory meds, oximetry and breathing techniques. Instruction on use of nebulizers and MDIs and importance of monitoring MDI activations.   Pulmonary Rehab from 03/31/2019 in [facility] Cardiac and Pulmonary Rehab  Date  03/31/19  Educator  [place]  Instruction Review Code  1- Verbalizes Understanding      General Nutrition Guidelines/Fats and Fiber: -Group instruction provided by verbal, written material, models and posters to present the general guidelines for heart healthy nutrition. Gives an explanation and review of dietary fats and fiber.   Cardiac Rehab from 05/14/2019 in [facility] Cardiac and Pulmonary Rehab  Date  04/30/19  Educator  mc  Instruction Review Code  1- Verbalizes Understanding      Controlling Sodium/Reading Food Labels: -Group verbal and written material supporting the discussion of sodium use in heart healthy nutrition. Review and explanation with models, verbal and written materials for utilization of the food label.   Exercise Physiology & General Exercise Guidelines: - Group verbal and written instruction with models to review the exercise physiology of the cardiovascular system and associated critical values. Provides general exercise guidelines with specific guidelines to those with heart  or lung disease.    Aerobic Exercise & Resistance Training: - Gives group verbal and written instruction on the various components of exercise. Focuses on aerobic and resistive training programs and the benefits of this training and how to safely progress through these programs.   Cardiac Rehab from 05/14/2019 in [facility] Cardiac and Pulmonary Rehab  Date  04/16/19  Educator  jh  Instruction Review Code  1- Verbalizes Understanding      Flexibility, Balance, Mind/Body Relaxation: Provides group verbal/written instruction on the benefits of flexibility and balance training, including mind/body exercise modes such as yoga, pilates and tai chi.  Demonstration and skill practice provided.   Cardiac Rehab from 05/14/2019 in [facility] Cardiac and Pulmonary Rehab  Date  05/14/19  Educator  as  Instruction Review Code  1- Verbalizes Understanding      Stress and Anxiety: - Provides group verbal and written instruction about the health risks of elevated stress and causes of high stress.  Discuss the correlation between heart/lung disease and anxiety and treatment options. Review healthy ways to manage with stress and anxiety.   Depression: - Provides group verbal and written instruction on the correlation between heart/lung disease and depressed mood, treatment options, and the stigmas associated with seeking treatment.   Exercise & Equipment Safety: - Individual verbal instruction and demonstration of equipment use and safety with use of the equipment.   Infection Prevention: - Provides verbal and written material to individual with discussion of infection control including proper hand washing and proper equipment cleaning during exercise session.   Pulmonary Rehab from 03/31/2019 in [facility] Cardiac and Pulmonary Rehab  Date  03/31/19  Educator  Banks  Instruction Review Code  1- Verbalizes Understanding      Falls Prevention: - Provides verbal and written material to individual with discussion of  falls prevention and safety.   Pulmonary Rehab from 03/31/2019 in [facility] Cardiac and Pulmonary Rehab  Date  03/31/19  Educator  [place]  Instruction Review Code  1- Verbalizes Understanding      Diabetes: - Individual verbal and written instruction to review signs/symptoms of diabetes, desired ranges of glucose level fasting, after meals and with exercise. Advice that pre and post exercise glucose checks will be done for 3 sessions at entry of program.   Chronic Lung Diseases: - Group verbal and written instruction  to review updates, respiratory medications, advancements in procedures and treatments. Discuss use of supplemental oxygen including available portable oxygen systems, continuous and intermittent flow rates, concentrators, personal use and safety guidelines. Review proper use of inhaler and spacers. Provide informative websites for self-education.    Cardiac Rehab from 05/14/2019 in [facility] Cardiac and Pulmonary Rehab  Date  05/14/19  Educator  [facility]  Instruction Review Code  1- Verbalizes Understanding      Energy Conservation: - Provide group verbal and written instruction for methods to conserve energy, plan and organize activities. Instruct on pacing techniques, use of adaptive equipment and posture/positioning to relieve shortness of breath.   Triggers and Exacerbations: - Group verbal and written instruction to review types of environmental triggers and ways to prevent exacerbations. Discuss weather changes, air quality and the benefits of nasal washing. Review warning signs and symptoms to help prevent infections. Discuss techniques for effective airway clearance, coughing, and vibrations.   AED/CPR: - Group verbal and written instruction with the use of models to demonstrate the basic use of the AED with the basic ABC's of resuscitation.   Anatomy and Physiology of the Lungs: - Group verbal and written instruction with the use of models to provide basic lung anatomy and  physiology related to function, structure and complications of lung disease.   Anatomy & Physiology of the Heart: - Group verbal and written instruction and models provide basic cardiac anatomy and physiology, with the coronary electrical and arterial systems. Review of Valvular disease and Heart Failure   Cardiac Medications: - Group verbal and written instruction to review commonly prescribed medications for heart disease. Reviews the medication, class of the drug, and side effects.   Know Your Numbers and Risk Factors: -Group verbal and written instruction about important numbers in your health.  Discussion of what are risk factors and how they play a role in the disease process.  Review of Cholesterol, Blood Pressure, Diabetes, and BMI and the role they play in your overall health.   Cardiac Rehab from 04/02/2019 in [facility] Cardiac and Pulmonary Rehab  Date  04/02/19  Educator  [facility]  Instruction Review Code  1- Verbalizes Understanding      Sleep Hygiene: -Provides group verbal and written instruction about how sleep can affect your health.  Define sleep hygiene, discuss sleep cycles and impact of sleep habits. Review good sleep hygiene tips.    Other: -Provides group and verbal instruction on various topics (see comments)    Knowledge Questionnaire Score:    Core Components/Risk Factors/Patient Goals at Admission: Personal Goals and Risk Factors at Admission - 03/26/19 1642      Core Components/Risk Factors/Patient Goals on Admission    Weight Management  Weight Loss;Yes    Intervention  Weight Management: Develop a combined nutrition and exercise program designed to reach desired caloric intake, while maintaining appropriate intake of nutrient and fiber, sodium and fats, and appropriate energy expenditure required for the weight goal.;Weight Management: Provide education and appropriate resources to help participant work on and attain dietary goals.    Expected Outcomes  Short  Term: Continue to assess and modify interventions until short term weight is achieved;Long Term: Adherence to nutrition and physical activity/exercise program aimed toward attainment of established weight goal;Weight Loss: Understanding of general recommendations for a balanced deficit meal plan, which promotes 1-2 lb weight loss per week and includes a negative energy balance of [phone] kcal/d;Understanding of distribution of calorie intake throughout the day with the consumption of 4-5 meals/snacks  Improve shortness of breath with ADL's  Yes    Intervention  Provide education, individualized exercise plan and daily activity instruction to help decrease symptoms of SOB with activities of daily living.    Expected Outcomes  Short Term: Improve cardiorespiratory fitness to achieve a reduction of symptoms when performing ADLs;Long Term: Be able to perform more ADLs without symptoms or delay the onset of symptoms    Heart Failure  Yes    Intervention  Provide a combined exercise and nutrition program that is supplemented with education, support and counseling about heart failure. Directed toward relieving symptoms such as shortness of breath, decreased exercise tolerance, and extremity edema.    Expected Outcomes  Improve functional capacity of life;Short term: Attendance in program 2-3 days a week with increased exercise capacity. Reported lower sodium intake. Reported increased fruit and vegetable intake. Reports medication compliance.;Short term: Daily weights obtained and reported for increase. Utilizing diuretic protocols set by physician.;Long term: Adoption of self-care skills and reduction of barriers for early signs and symptoms recognition and intervention leading to self-care maintenance.    Hypertension  Yes    Intervention  Provide education on lifestyle modifcations including regular physical activity/exercise, weight management, moderate sodium restriction and increased consumption of fresh  fruit, vegetables, and low fat dairy, alcohol moderation, and smoking cessation.;Monitor prescription use compliance.    Expected Outcomes  Short Term: Continued assessment and intervention until BP is < 140/68m HG in hypertensive participants. < 130/856mHG in hypertensive participants with diabetes, heart failure or chronic kidney disease.;Long Term: Maintenance of blood pressure at goal levels.    Lipids  Yes    Intervention  Provide education and support for participant on nutrition & aerobic/resistive exercise along with prescribed medications to achieve LDL <7090mHDL >4m11m  Expected Outcomes  Short Term: Participant states understanding of desired cholesterol values and is compliant with medications prescribed. Participant is following exercise prescription and nutrition guidelines.;Long Term: Cholesterol controlled with medications as prescribed, with individualized exercise RX and with personalized nutrition plan. Value goals: LDL < 70mg36mL > 40 mg.       Core Components/Risk Factors/Patient Goals Review:  Goals and Risk Factor Review    Row Name 04/21/19 0910 05/19/19 0840           Core Components/Risk Factors/Patient Goals Review   Personal Goals Review  Weight Management/Obesity;Hypertension;Lipids;Improve shortness of breath with ADL's;Heart Failure  Weight Management/Obesity;Increase knowledge of respiratory medications and ability to use respiratory devices properly.;Improve shortness of breath with ADL's      Review  MarviMates to lose some weight. His shortness of breath has improved slightly since the begining of the program. His blood pressure has been slightly elevated at 140/60 today. He wants to lose a little weight and be around 135 pounds.  MarviAndrielaking all meds as directed.  He says shortness of breath is about the same.  He does do some strength training at home on days not here.  He does some stretches for his back in the morning.  We discussed using how  clothes fit, etc and not just the scale to measure weight loss.      Expected Outcomes  Short: lose some weight. Long: maitnain weight loss independently.  Short :  continue strength work Long : maintain strength and improve overall stamina         Core Components/Risk Factors/Patient Goals at Discharge (Final Review):  Goals and Risk Factor Review - 05/19/19 0840  Core Components/Risk Factors/Patient Goals Review   Personal Goals Review  Weight Management/Obesity;Increase knowledge of respiratory medications and ability to use respiratory devices properly.;Improve shortness of breath with ADL's    Review  Hersey is taking all meds as directed.  He says shortness of breath is about the same.  He does do some strength training at home on days not here.  He does some stretches for his back in the morning.  We discussed using how clothes fit, etc and not just the scale to measure weight loss.    Expected Outcomes  Short :  continue strength work Long : maintain strength and improve overall stamina       ITP Comments: ITP Comments    Row Name 03/26/19 1658 04/02/19 0902 04/15/19 0701 04/27/19 1515 05/13/19 1042   ITP Comments  Virtual Orientation complete. Visit diagnosis can be found in [facility]. RD and EP evaluation date on 03/31/2019 0930.  First full day of exercise!  Patient was oriented to gym and equipment including functions, settings, policies, and procedures.  Patient's individual exercise prescription and treatment plan were reviewed.  All starting workloads were established based on the results of the 6 minute walk test done at initial orientation visit.  The plan for exercise progression was also introduced and progression will be customized based on patient's performance and goals.  30 day review completed. Continue with ITP sent to Dr. Emily Filbert, Medical Director of Cardiac and Pulmonary Rehab for review , changes as needed and signature.   New to program  RD eval deferred at this time  - will continue to check in  30 day review competed . ITP sent to Dr Emily Filbert for review, changes as needed and ITP approval signature.   [place] Name 06/10/19 0915           ITP Comments  30 day review competed . ITP sent to Dr Emily Filbert for review, changes as needed and ITP approval signature          Comments:

## 2019-06-12 ENCOUNTER — Other Ambulatory Visit: Payer: Self-pay | Admitting: Emergency Medicine

## 2019-06-18 ENCOUNTER — Ambulatory Visit: Payer: PPO

## 2019-06-22 ENCOUNTER — Other Ambulatory Visit: Payer: Self-pay

## 2019-06-22 ENCOUNTER — Encounter (HOSPITAL_COMMUNITY): Payer: Self-pay | Admitting: Internal Medicine

## 2019-06-22 ENCOUNTER — Ambulatory Visit (HOSPITAL_COMMUNITY)
Admission: RE | Admit: 2019-06-22 | Discharge: 2019-06-22 | Disposition: A | Discharge status: Home / Self Care | Payer: PPO | Source: Ambulatory Visit | Attending: Internal Medicine | Admitting: Internal Medicine

## 2019-06-22 ENCOUNTER — Ambulatory Visit: Payer: PPO

## 2019-06-22 VITALS — BP 134/60 | HR 77 | Wt 142.0 lb

## 2019-06-22 DIAGNOSIS — I2721 Secondary pulmonary arterial hypertension: Secondary | ICD-10-CM | POA: Diagnosis not present

## 2019-06-22 DIAGNOSIS — I2510 Atherosclerotic heart disease of native coronary artery without angina pectoris: Secondary | ICD-10-CM | POA: Insufficient documentation

## 2019-06-22 DIAGNOSIS — Z809 Family history of malignant neoplasm, unspecified: Secondary | ICD-10-CM | POA: Diagnosis not present

## 2019-06-22 DIAGNOSIS — I4821 Permanent atrial fibrillation: Secondary | ICD-10-CM | POA: Insufficient documentation

## 2019-06-22 DIAGNOSIS — I447 Left bundle-branch block, unspecified: Secondary | ICD-10-CM | POA: Insufficient documentation

## 2019-06-22 DIAGNOSIS — Z823 Family history of stroke: Secondary | ICD-10-CM | POA: Insufficient documentation

## 2019-06-22 DIAGNOSIS — I110 Hypertensive heart disease with heart failure: Secondary | ICD-10-CM | POA: Insufficient documentation

## 2019-06-22 DIAGNOSIS — I252 Old myocardial infarction: Secondary | ICD-10-CM | POA: Insufficient documentation

## 2019-06-22 DIAGNOSIS — Z87891 Personal history of nicotine dependence: Secondary | ICD-10-CM | POA: Insufficient documentation

## 2019-06-22 DIAGNOSIS — Z7901 Long term (current) use of anticoagulants: Secondary | ICD-10-CM | POA: Insufficient documentation

## 2019-06-22 DIAGNOSIS — Z79899 Other long term (current) drug therapy: Secondary | ICD-10-CM | POA: Diagnosis not present

## 2019-06-22 DIAGNOSIS — E785 Hyperlipidemia, unspecified: Secondary | ICD-10-CM | POA: Insufficient documentation

## 2019-06-22 DIAGNOSIS — F419 Anxiety disorder, unspecified: Secondary | ICD-10-CM | POA: Insufficient documentation

## 2019-06-22 DIAGNOSIS — I272 Pulmonary hypertension, unspecified: Secondary | ICD-10-CM | POA: Insufficient documentation

## 2019-06-22 DIAGNOSIS — M48061 Spinal stenosis, lumbar region without neurogenic claudication: Secondary | ICD-10-CM | POA: Diagnosis not present

## 2019-06-22 DIAGNOSIS — I5032 Chronic diastolic (congestive) heart failure: Secondary | ICD-10-CM

## 2019-06-22 DIAGNOSIS — Z882 Allergy status to sulfonamides status: Secondary | ICD-10-CM | POA: Diagnosis not present

## 2019-06-22 DIAGNOSIS — R9431 Abnormal electrocardiogram [ECG] [EKG]: Secondary | ICD-10-CM | POA: Diagnosis not present

## 2019-06-22 DIAGNOSIS — Z8249 Family history of ischemic heart disease and other diseases of the circulatory system: Secondary | ICD-10-CM | POA: Diagnosis not present

## 2019-06-22 DIAGNOSIS — Z8673 Personal history of transient ischemic attack (TIA), and cerebral infarction without residual deficits: Secondary | ICD-10-CM | POA: Insufficient documentation

## 2019-06-22 DIAGNOSIS — I340 Nonrheumatic mitral (valve) insufficiency: Secondary | ICD-10-CM

## 2019-06-22 DIAGNOSIS — J449 Chronic obstructive pulmonary disease, unspecified: Secondary | ICD-10-CM | POA: Diagnosis not present

## 2019-06-22 DIAGNOSIS — Z880 Allergy status to penicillin: Secondary | ICD-10-CM | POA: Insufficient documentation

## 2019-06-22 DIAGNOSIS — I5042 Chronic combined systolic (congestive) and diastolic (congestive) heart failure: Secondary | ICD-10-CM | POA: Insufficient documentation

## 2019-06-22 DIAGNOSIS — Z888 Allergy status to other drugs, medicaments and biological substances status: Secondary | ICD-10-CM | POA: Insufficient documentation

## 2019-06-22 DIAGNOSIS — G629 Polyneuropathy, unspecified: Secondary | ICD-10-CM | POA: Insufficient documentation

## 2019-06-22 NOTE — Patient Instructions (Signed)
No labs done today.  No medication changes were made. Please continue all current medications as prescribed.  Your physician recommends that you schedule a follow-up appointment in: 6 months. We will contact you to schedule an appointment.   [facility] will contact you about an overnight oximeter.   At the [facility], you and your health needs are our priority. As part of our continuing mission to provide you with exceptional heart care, we have created designated Provider Care Teams. These Care Teams include your primary Cardiologist (physician) and Advanced Practice Providers (APPs- Physician Assistants and Nurse Practitioners) who all work together to provide you with the care you need, when you need it.   You may see any of the following providers on your designated Care Team at your next follow up: Marland Kitchen Dr [name] . Dr Loralie Champagne . Darrick Grinder, NP . Lyda Jester, PA . Audry Riles, PharmD   Please be sure to bring in all your medications bottles to every appointment.

## 2019-06-22 NOTE — Progress Notes (Signed)
[facility] CONSULT NOTE  Referring Physician: Dr. Rockey Situ Primary Cardiologist: Dr. Rockey Situ  HPI:  Eduardo [name] is a 86 y.o. male with COPD, permanent AF, CAD s/p previous IMI, HTN, CVA, spinal stenosis. Referred by Dr. Rockey Situ for further evaluation of pulmonary HTN.   Follows with Dr. Lamonte Sakai for his COPD.   PFTs 2012 FEV1 1.86 (59%) FVC    1.03 (50%) DLCO 56%  Had ECHO  3/20. EF 60% RV mid HK. Marked biatrial enlargement. Moderate MR. Mild AS. RVSP 58mmHG   Here with his wife. Still pretty functional. Walks with walker. Can go 150 feet and then has to stop due mostly to his spinal stenosis and numbness in feet. Also pretty SOB. Mild LE edema. No orthopnea or PND. Mild snoring. Never had sleep study. No syncope or presyncope.    Review of Systems: [y] = yes, [ ]  = no   General: Weight gain [ ] ; Weight loss [ ] ; Anorexia [ ] ; Fatigue [ ] ; Fever [ ] ; Chills [ ] ; Weakness [ ]   Cardiac: Chest pain/pressure [ ] ; Resting SOB [ ] ; Exertional SOB Blue.Reese ]; Orthopnea [ ] ; Pedal Edema Blue.Reese ]; Palpitations [ ] ; Syncope [ ] ; Presyncope [ ] ; Paroxysmal nocturnal dyspnea[ ]   Pulmonary: Cough [ ] ; Wheezing[ ] ; Hemoptysis[ ] ; Sputum [ ] ; Snoring [ ]   GI: Vomiting[ ] ; Dysphagia[ ] ; Melena[ ] ; Hematochezia [ ] ; Heartburn[ ] ; Abdominal pain [ ] ; Constipation [ ] ; Diarrhea [ ] ; BRBPR [ ]   GU: Hematuria[ ] ; Dysuria [ ] ; Nocturia[ ]   Vascular: Pain in legs with walking [y]; Pain in feet with lying flat Blue.Reese ]; Non-healing sores [ ] ; Stroke [ ] ; TIA [ ] ; Slurred speech [ ] ;  Neuro: Headaches[ ] ; Vertigo[ ] ; Seizures[ ] ; Paresthesias[ ] ;Blurred vision [ ] ; Diplopia [ ] ; Vision changes [ ]   Ortho/Skin: Arthritis Blue.Reese ]; Joint pain Blue.Reese ]; Muscle pain [ ] ; Joint swelling [ ] ; Back Pain [ y]; Rash [ ]   Psych: Depression[ ] ; Anxiety[ ]   Heme: Bleeding problems [ ] ; Clotting disorders [ ] ; Anemia [ ]   Endocrine: Diabetes [ ] ; Thyroid dysfunction[ ]    Past Medical History:  Diagnosis Date  . Allergy   .  Anemia 2012, 08/2015   chronic. Acute due to large hematoma 2013. 2017: due to gastric AVM bleeding.   . Anxiety   . CAD (coronary artery disease)    a. 06/2010 Cath: LM nl, LAD 50p/m, LCX 30m, RCA 50p/m -->catheter dissection but good flow, 70d, RPDA 50-70;  b. 06/2010 Relook cath due to bradycardia and inf ST elev: RCA dissection @ ostium extending into coronary cusp and prox RCA, Ao dissection-->less staining than previously-->Med Rx; c. 08/2018 MV: No ischemia.  . Chronic combined systolic (congestive) and diastolic (congestive) heart failure ([place])    a. 12/2014 Echo: EF 60-65%; b. 07/2016 Echo: EF 45-50%, mild LVH. Mild AS/MS. Mod-sev MR. Mild to mod TR. PASP 56mmHg; c. 08/2018 Echo: EF 55-60%, RVSP 68.35mmHg. Sev dil LA, mod dil RA. at least mod MR, mild to mod TR. Mild AS.  Marland Kitchen COPD (chronic obstructive pulmonary disease) ([place]) 2013   exertional dyspnea  . Gastric angiodysplasia with hemorrhage 08/2015  . Hx of colonic polyps 2001, 2011   adenomatous 2001. HP 2001, 2011.  Marland Kitchen Hyperlipidemia   . Mitral regurgitation    a. 07/2016 Echo: Mod to sev MR; b. 08/2018 Echo: at least moderate MR.  . Myocardial infarction ([place]) 06/2010.  12/2011.   "twice; 1 day apart;  during/after cath". NQMI in setting severe anemia 2013.   Marland Kitchen Neuropathy 2014   in feet, likely due to spinal stenosis, DDD, HNP  . Osteoarthritis 2002   spinal stenosis, spondylolisthesis, disc protrusion multilevel in lumbar spine  . Permanent atrial fibrillation ([place]) 2012   a.  CHA2DS2VASc = 6-->eliquis;  b. 12/2015 Echo: EF 60-65%, no rwma, LVH, mild AS/MS/MR, sev dil LA, mild TR, PASP 57mmHg.  . Stroke ([place]) 2016  . Upper GI bleed 08/2015   EGD: 2 small gastric AVMs, 1 actively bleeding.  Both treated with APC ablation, clipping of the bleeder    Current Outpatient Medications  Medication Sig Dispense Refill  . acetaminophen (TYLENOL) 650 MG CR tablet Take 1,300 mg by mouth 2 (two) times daily.    Marland Kitchen albuterol (PROVENTIL) (2.5 MG/3ML)  0.083% nebulizer solution Use 1 vial in nebulizer every 4 hours as needed for wheezing or shortness of breath 270 mL 2  . ALPRAZolam (XANAX) 0.25 MG tablet Take 1 tablet (0.25 mg total) by mouth 2 (two) times daily as needed for anxiety. 20 tablet 0  . atorvastatin (LIPITOR) 80 MG tablet Take 1 tablet (80 mg total) by mouth daily. 90 tablet 1  . B Complex-C (SUPER B COMPLEX PO) Take 1 tablet by mouth at bedtime.     . cetirizine (ZYRTEC) 10 MG tablet Take 10 mg by mouth at bedtime.     . cholecalciferol (VITAMIN D) 1000 units tablet Take 1,000 Units by mouth daily.    Marland Kitchen ELIQUIS 5 MG TABS tablet TAKE ONE TABLET BY MOUTH TWICE DAILY  180 tablet 0  . escitalopram (LEXAPRO) 20 MG tablet Take 20 mg by mouth at bedtime.     . fenofibrate 160 MG tablet TAKE ONE TABLET BY MOUTH DAILY AT BEDTIME  90 tablet 3  . furosemide (LASIX) 40 MG tablet Take 1 tablet (40 mg total) by mouth daily. 90 tablet 3  . hydrALAZINE (APRESOLINE) 10 MG tablet TAKE ONE TABLET BY MOUTH THREE TIMES DAILY  270 tablet 0  . HYDROcodone-acetaminophen (NORCO/VICODIN) 5-325 MG tablet Take 1 tablet by mouth every 4 (four) hours as needed for moderate pain.    Marland Kitchen ipratropium (ATROVENT) 0.03 % nasal spray PLACE 2 SPRAYS INTO BOTH NOSTRILS AT BEDTIME 30 mL 2  . lamoTRIgine (LAMICTAL) 150 MG tablet Take 150 mg by mouth at bedtime. Reported on 08/14/2015    . montelukast (SINGULAIR) 10 MG tablet TAKE ONE TABLET BY MOUTH DAILY AT BEDTIME  90 tablet 0  . Multiple Vitamin (MULTIVITAMIN) tablet Take 1 tablet by mouth at bedtime.     . nitroGLYCERIN (NITROSTAT) 0.4 MG SL tablet Place 1 tablet (0.4 mg total) under the tongue every 5 (five) minutes as needed for chest pain. 90 tablet 3  . pantoprazole (PROTONIX) 40 MG tablet TAKE 1 TABLET BY MOUTH DAILY BEFORE BREAKFAST. 90 tablet 3  . STIOLTO RESPIMAT 2.5-2.5 MCG/ACT AERS INHALE TWO PUFFS BY MOUTH INTO THE LUNGS DAILY 12 g 0  . triamcinolone cream (KENALOG) 0.1 % Apply 1 application topically 2 (two)  times daily as needed (for skin).     . verapamil (CALAN-SR) 240 MG CR tablet TAKE ONE TABLET BY MOUTH AT BEDTIME 90 tablet 0   No current facility-administered medications for this encounter.    Allergies  Allergen Reactions  . Penicillins Itching    Has patient had a PCN reaction causing immediate rash, facial/tongue/throat swelling, SOB or lightheadedness with hypotension: Yes Has patient had a PCN reaction causing severe rash  involving mucus membranes or skin necrosis: No Has patient had a PCN reaction that required hospitalization No Has patient had a PCN reaction occurring within the last 10 years: No If all of the above answers are "NO", then may proceed with Cephalosporin use.   . Sulfonamide Derivatives Other (See Comments)    "it affected my kidneys"  . Tape Other (See Comments)    Arms black and blue, tears skin.  Please use "paper" tape      Social History   Socioeconomic History  . Marital status: Married    Spouse name: Not on file  . Number of children: 4  . Years of education: some college  . Highest education level: Not on file  Occupational History  . Occupation: Retired Chief Strategy Officer the job trainin)  Tobacco Use  . Smoking status: Former Smoker    Packs/day: 2.50    Years: 30.00    Pack years: 75.00    Types: Cigarettes    Quit date: 06/11/1978    Years since quitting: 41.0  . Smokeless tobacco: Never Used  Substance and Sexual Activity  . Alcohol use: No    Comment: QUIT 0086 alcoholic  . Drug use: No  . Sexual activity: Not Currently  Other Topics Concern  . Not on file  Social History Narrative   Grew up in [place], [place].  Lives in [place].   Married---2nd   2 children ([place], [place]  and near [place])--2 stepchildren   Former Smoker--quit in 1980   Alcohol use-no. Quit in 1980--alcoholic      Has living will.   Has designated wife, then step daughter [name] have health care POA.   Would accept resuscitation attempts.    No feeding tube if cognitively unaware.   Social Determinants of Health   Financial Resource Strain:   . Difficulty of Paying Living Expenses: Not on file  Food Insecurity:   . Worried About Charity fundraiser in the Last Year: Not on file  . Ran Out of Food in the Last Year: Not on file  Transportation Needs:   . Lack of Transportation (Medical): Not on file  . Lack of Transportation (Non-Medical): Not on file  Physical Activity:   . Days of Exercise per Week: Not on file  . Minutes of Exercise per Session: Not on file  Stress:   . Feeling of Stress : Not on file  Social Connections:   . Frequency of Communication with Friends and Family: Not on file  . Frequency of Social Gatherings with Friends and Family: Not on file  . Attends Religious Services: Not on file  . Active Member of Clubs or Organizations: Not on file  . Attends Archivist Meetings: Not on file  . Marital Status: Not on file  Intimate Partner Violence:   . Fear of Current or Ex-Partner: Not on file  . Emotionally Abused: Not on file  . Physically Abused: Not on file  . Sexually Abused: Not on file      Family History  Problem Relation Age of Onset  . Cancer Mother        ? leukemia  . Heart attack Father   . Stroke Maternal Grandmother   . Diabetes Neg Hx   . Hypertension Neg Hx     Vitals:   06/22/19 1511  BP: 134/60  Pulse: 77  SpO2: 96%  Weight: 64.4 kg (142 lb)    PHYSICAL EXAM: General:  Elderly male. Walks with walker  No respiratory difficulty HEENT: normal Neck: supple. no JVD. Carotids 2+ bilat; no bruits. No lymphadenopathy or thryomegaly appreciated. Cor: Barrel chested  PMI nondisplaced. Irregular rate & rhythm. No rubs, gallops or murmurs. Lungs: decreased BS throughout no wheeze Abdomen: soft, nontender, nondistended. No hepatosplenomegaly. No bruits or masses. Good bowel sounds. Extremities: no cyanosis, rash, edema + clubbing Neuro: alert & oriented x 3, cranial  nerves grossly intact. moves all 4 extremities w/o difficulty. Affect pleasant.  ECG: AF 85 iLBBB Personally reviewed   ASSESSMENT & PLAN:  1. Pulmonary HTN by echo - Discussed pathophysiology of left vs right-sided PH in detail with him and his wife  - Suspect combination WHO Group 2 (HF) & 3 (lung disease). - In looking at echo, likely has restrictive CM with moderate MR contributing to pulmonary venous HTN - He also has evidence of significant lung disease by PFTs in 2012 with clubbing on exam but no desation on hall walk with me today.  - We discussed possibility of RHC to further define degree of PH and also sort out R vs L components but I told him it was unlikely to change the way we treat him - Will defer RHC for now - Given clubbing will check overnight oximetry and may need PSG - Unable to exercise at gym so suggested he get stationary bike to continue his exercise program - Will see back in 6 months to check back in on him.   Total time spent 40 minutes. Over half that time spent discussing above.    [name], MD  5:35 PM

## 2019-06-24 ENCOUNTER — Encounter: Payer: PPO | Attending: Internal Medicine | Admitting: *Deleted

## 2019-06-24 ENCOUNTER — Other Ambulatory Visit: Payer: Self-pay

## 2019-06-24 DIAGNOSIS — I5030 Unspecified diastolic (congestive) heart failure: Secondary | ICD-10-CM | POA: Insufficient documentation

## 2019-06-24 DIAGNOSIS — I5032 Chronic diastolic (congestive) heart failure: Secondary | ICD-10-CM

## 2019-06-24 NOTE — Progress Notes (Signed)
Completed virtual visit today.   Eduardo [name] decided to transition to virtual based rehab due to increasing COVID numbers.  He got his first vaccine shot yesterday.  He was referred to Dr. Haroldine Laws this week for his heart failure.  Dr. Haroldine Laws suggested medical management given his age and encouraged home oxygen for night time to help.  Due to his heart failure and COPD, he has developed pulmonary hypertension and a leaky mitral valve.  Overall, he feels well and is maintaining his weight.  He has been walking some and is looking into purchasing some equipment for home use.  [facility] looked into the [facility], but they only had one stepper that he could use.    He is scheduled for his next follow up on 1/27 at 8am.

## 2019-06-29 ENCOUNTER — Encounter: Payer: PPO | Admitting: Internal Medicine

## 2019-07-02 ENCOUNTER — Other Ambulatory Visit: Payer: Self-pay

## 2019-07-02 ENCOUNTER — Encounter: Payer: Self-pay | Admitting: Internal Medicine

## 2019-07-02 ENCOUNTER — Ambulatory Visit (INDEPENDENT_AMBULATORY_CARE_PROVIDER_SITE_OTHER): Payer: PPO | Admitting: Internal Medicine

## 2019-07-02 ENCOUNTER — Ambulatory Visit: Payer: PPO

## 2019-07-02 VITALS — BP 152/70 | HR 77 | Temp 97.9°F | Ht 61.75 in | Wt 145.2 lb

## 2019-07-02 DIAGNOSIS — Z0000 Encounter for general adult medical examination without abnormal findings: Secondary | ICD-10-CM

## 2019-07-02 DIAGNOSIS — I5032 Chronic diastolic (congestive) heart failure: Secondary | ICD-10-CM | POA: Diagnosis not present

## 2019-07-02 DIAGNOSIS — M48062 Spinal stenosis, lumbar region with neurogenic claudication: Secondary | ICD-10-CM

## 2019-07-02 DIAGNOSIS — R2689 Other abnormalities of gait and mobility: Secondary | ICD-10-CM

## 2019-07-02 DIAGNOSIS — F1021 Alcohol dependence, in remission: Secondary | ICD-10-CM

## 2019-07-02 DIAGNOSIS — J439 Emphysema, unspecified: Secondary | ICD-10-CM | POA: Diagnosis not present

## 2019-07-02 DIAGNOSIS — F39 Unspecified mood [affective] disorder: Secondary | ICD-10-CM

## 2019-07-02 DIAGNOSIS — Z7189 Other specified counseling: Secondary | ICD-10-CM | POA: Diagnosis not present

## 2019-07-02 DIAGNOSIS — I482 Chronic atrial fibrillation, unspecified: Secondary | ICD-10-CM | POA: Diagnosis not present

## 2019-07-02 DIAGNOSIS — K219 Gastro-esophageal reflux disease without esophagitis: Secondary | ICD-10-CM | POA: Insufficient documentation

## 2019-07-02 NOTE — Assessment & Plan Note (Signed)
See social history 

## 2019-07-02 NOTE — Assessment & Plan Note (Signed)
Chronic atrial fib He doesn't feel it On verapamil and eliquis

## 2019-07-02 NOTE — Assessment & Plan Note (Signed)
I have personally reviewed the Medicare Annual Wellness questionnaire and have noted 1. The patient's medical and social history 2. Their use of alcohol, tobacco or illicit drugs 3. Their current medications and supplements 4. The patient's functional ability including ADL's, fall risks, home safety risks and hearing or visual             impairment. 5. Diet and physical activities 6. Evidence for depression or mood disorders  The patients weight, height, BMI and visual acuity have been recorded in the chart I have made referrals, counseling and provided education to the patient based review of the above and I have provided the pt with a written personalized care plan for preventive services.  I have provided you with a copy of your personalized plan for preventive services. Please take the time to review along with your updated medication list.  Had first COVID vaccine No cancer screening due to age Yearly flu vaccine Working on regular exercise

## 2019-07-02 NOTE — Assessment & Plan Note (Signed)
Has worked with proper breathing technique, etc On singulair, stioloto proair prn

## 2019-07-02 NOTE — Assessment & Plan Note (Signed)
He has continued the protonix--but no symptoms of heartburn or dysphagia Discussed cutting back to every other day

## 2019-07-02 NOTE — Assessment & Plan Note (Signed)
Saw CHF specialist Has ordered new machine for at home to do daily exercise Still on hydralazine, lasix, verapamil Monitors weight and oximetry

## 2019-07-02 NOTE — Progress Notes (Signed)
Subjective:    Patient ID: Eduardo [name], male    DOB: 28-Jan-1931, 86 y.o.   MRN: [id number]  HPI Here with wife for Medicare wellness visit and follow up of chronic health condtions This visit occurred during the SARS-CoV-2 public health emergency.  Safety protocols were in place, including screening questions prior to the visit, additional usage of staff PPE, and extensive cleaning of exam room while observing appropriate contact time as indicated for disinfecting solutions.   Reviewed form and advanced directives Reviewed other doctors No alcohol or tobacco Has been working with cardiopulmonary rehab and does light exercise at home Vision is fine Has hearing aides but doesn't wear them Slipped in shower once--landed on elbow. Just a mild bruise. Chronic mood problems--but has done well Doesn't drive. Will do some stairs in the house (to get to his shop in the basement). Wife does the instrumental ADLs---he helps some No significant memory issues  Finished at [facility] for the rehab They canceled due to the craziness at the hospital  Planning to get new machine for exercise at home No chest pain Breathing okay--does have stable exertional dyspnea (has to stop and deep breath) No orthostatic dizziness No regular dizziness--he monitors his weight  Dr Sung Amabile did note some clubbing so he is getting overnight oximetry to check oxygen levels  Mood generally okay  Current Outpatient Medications on File Prior to Visit  Medication Sig Dispense Refill  . albuterol (PROVENTIL) (2.5 MG/3ML) 0.083% nebulizer solution Use 1 vial in nebulizer every 4 hours as needed for wheezing or shortness of breath 270 mL 2  . ALPRAZolam (XANAX) 0.25 MG tablet Take 1 tablet (0.25 mg total) by mouth 2 (two) times daily as needed for anxiety. 20 tablet 0  . atorvastatin (LIPITOR) 80 MG tablet Take 1 tablet (80 mg total) by mouth daily. 90 tablet 1  . B Complex-C (SUPER B COMPLEX PO) Take 1 tablet by  mouth at bedtime.     . cetirizine (ZYRTEC) 10 MG tablet Take 10 mg by mouth at bedtime.     . cholecalciferol (VITAMIN D) 1000 units tablet Take 1,000 Units by mouth daily.    Marland Kitchen ELIQUIS 5 MG TABS tablet TAKE ONE TABLET BY MOUTH TWICE DAILY  180 tablet 0  . escitalopram (LEXAPRO) 20 MG tablet Take 20 mg by mouth at bedtime.     . fenofibrate 160 MG tablet TAKE ONE TABLET BY MOUTH DAILY AT BEDTIME  90 tablet 3  . furosemide (LASIX) 40 MG tablet Take 1 tablet (40 mg total) by mouth daily. 90 tablet 3  . hydrALAZINE (APRESOLINE) 10 MG tablet TAKE ONE TABLET BY MOUTH THREE TIMES DAILY  270 tablet 0  . HYDROcodone-acetaminophen (NORCO/VICODIN) 5-325 MG tablet Take 1 tablet by mouth every 4 (four) hours as needed for moderate pain.    Marland Kitchen ipratropium (ATROVENT) 0.03 % nasal spray PLACE 2 SPRAYS INTO BOTH NOSTRILS AT BEDTIME 30 mL 2  . lamoTRIgine (LAMICTAL) 150 MG tablet Take 150 mg by mouth at bedtime. Reported on 08/14/2015    . montelukast (SINGULAIR) 10 MG tablet TAKE ONE TABLET BY MOUTH DAILY AT BEDTIME  90 tablet 0  . Multiple Vitamin (MULTIVITAMIN) tablet Take 1 tablet by mouth at bedtime.     . pantoprazole (PROTONIX) 40 MG tablet TAKE 1 TABLET BY MOUTH DAILY BEFORE BREAKFAST. 90 tablet 3  . STIOLTO RESPIMAT 2.5-2.5 MCG/ACT AERS INHALE TWO PUFFS BY MOUTH INTO THE LUNGS DAILY 12 g 0  . triamcinolone cream (KENALOG)  0.1 % Apply 1 application topically 2 (two) times daily as needed (for skin).     . verapamil (CALAN-SR) 240 MG CR tablet TAKE ONE TABLET BY MOUTH AT BEDTIME 90 tablet 0  . nitroGLYCERIN (NITROSTAT) 0.4 MG SL tablet Place 1 tablet (0.4 mg total) under the tongue every 5 (five) minutes as needed for chest pain. 90 tablet 3   No current facility-administered medications on file prior to visit.    Allergies  Allergen Reactions  . Penicillins Itching    Has patient had a PCN reaction causing immediate rash, facial/tongue/throat swelling, SOB or lightheadedness with hypotension: Yes Has  patient had a PCN reaction causing severe rash involving mucus membranes or skin necrosis: No Has patient had a PCN reaction that required hospitalization No Has patient had a PCN reaction occurring within the last 10 years: No If all of the above answers are "NO", then may proceed with Cephalosporin use.   . Sulfonamide Derivatives Other (See Comments)    "it affected my kidneys"  . Tape Other (See Comments)    Arms black and blue, tears skin.  Please use "paper" tape    Past Medical History:  Diagnosis Date  . Allergy   . Anemia 2012, 08/2015   chronic. Acute due to large hematoma 2013. 2017: due to gastric AVM bleeding.   . Anxiety   . CAD (coronary artery disease)    a. 06/2010 Cath: LM nl, LAD 50p/m, LCX 60m, RCA 50p/m -->catheter dissection but good flow, 70d, RPDA 50-70;  b. 06/2010 Relook cath due to bradycardia and inf ST elev: RCA dissection @ ostium extending into coronary cusp and prox RCA, Ao dissection-->less staining than previously-->Med Rx; c. 08/2018 MV: No ischemia.  . Chronic combined systolic (congestive) and diastolic (congestive) heart failure ([place])    a. 12/2014 Echo: EF 60-65%; b. 07/2016 Echo: EF 45-50%, mild LVH. Mild AS/MS. Mod-sev MR. Mild to mod TR. PASP 81mmHg; c. 08/2018 Echo: EF 55-60%, RVSP 68.86mmHg. Sev dil LA, mod dil RA. at least mod MR, mild to mod TR. Mild AS.  Marland Kitchen COPD (chronic obstructive pulmonary disease) ([place]) 2013   exertional dyspnea  . Gastric angiodysplasia with hemorrhage 08/2015  . Hx of colonic polyps 2001, 2011   adenomatous 2001. HP 2001, 2011.  Marland Kitchen Hyperlipidemia   . Mitral regurgitation    a. 07/2016 Echo: Mod to sev MR; b. 08/2018 Echo: at least moderate MR.  . Myocardial infarction ([place]) 06/2010.  12/2011.   "twice; 1 day apart; during/after cath". NQMI in setting severe anemia 2013.   Marland Kitchen Neuropathy 2014   in feet, likely due to spinal stenosis, DDD, HNP  . Osteoarthritis 2002   spinal stenosis, spondylolisthesis, disc protrusion multilevel in  lumbar spine  . Permanent atrial fibrillation ([place]) 2012   a.  CHA2DS2VASc = 6-->eliquis;  b. 12/2015 Echo: EF 60-65%, no rwma, LVH, mild AS/MS/MR, sev dil LA, mild TR, PASP 67mmHg.  . Stroke ([place]) 2016  . Upper GI bleed 08/2015   EGD: 2 small gastric AVMs, 1 actively bleeding.  Both treated with APC ablation, clipping of the bleeder    Past Surgical History:  Procedure Laterality Date  . APPENDECTOMY    . CARDIAC CATHETERIZATION  2012   50% LAD and luminal irreg in RCA, 1/12  Post cath MI from damage  . CARPAL TUNNEL RELEASE     left  . CARPAL TUNNEL RELEASE  10/11/11   Dr Rush Barer  . CATARACT EXTRACTION W/ INTRAOCULAR LENS IMPLANT  2/13  Dr [name]  . COLONOSCOPY  2001, 02/2010   Dr Deatra Ina.   . ESOPHAGOGASTRODUODENOSCOPY N/A 08/15/2015   [name] MD; 2 small gastric AVMs, 1 actively bleeding.  Both treated with APC ablation, clipping of the bleeder  . FOOT FRACTURE SURGERY     right heel repair  . FRACTURE SURGERY    . KNEE ARTHROSCOPY     left, right knee 10/2009  . MASTOIDECTOMY     x3 on right  . NASAL SEPTUM SURGERY     nasal septal repair  . PENILE PROSTHESIS  REMOVAL    . PENILE PROSTHESIS IMPLANT     x 2--got infection after 2nd and had to remove  . PILONIDAL CYST / SINUS EXCISION    . SHOULDER ARTHROSCOPY     right  . TONSILLECTOMY AND ADENOIDECTOMY      Family History  Problem Relation Age of Onset  . Cancer Mother        ? leukemia  . Heart attack Father   . Stroke Maternal Grandmother   . Diabetes Neg Hx   . Hypertension Neg Hx     Social History   Socioeconomic History  . Marital status: Married    Spouse name: Not on file  . Number of children: 4  . Years of education: some college  . Highest education level: Not on file  Occupational History  . Occupation: Retired Chief Strategy Officer the job trainin)  Tobacco Use  . Smoking status: Former Smoker    Packs/day: 2.50    Years: 30.00    Pack years: 75.00    Types:  Cigarettes    Quit date: 06/11/1978    Years since quitting: 41.0  . Smokeless tobacco: Never Used  Substance and Sexual Activity  . Alcohol use: No    Comment: QUIT [id number] alcoholic  . Drug use: No  . Sexual activity: Not Currently  Other Topics Concern  . Not on file  Social History Narrative   Grew up in [place], [place].  Lives in [place].   Married---2nd   2 children ([place], [place]  and near [place])--2 stepchildren   Former Smoker--quit in 1980   Alcohol use-no. Quit in 1980--alcoholic      Has living will.   Has designated wife, then step daughter [name] have health care POA.   Would accept resuscitation attempts.   No feeding tube if cognitively unaware.   Social Determinants of Health   Financial Resource Strain:   . Difficulty of Paying Living Expenses: Not on file  Food Insecurity:   . Worried About Charity fundraiser in the Last Year: Not on file  . Ran Out of Food in the Last Year: Not on file  Transportation Needs:   . Lack of Transportation (Medical): Not on file  . Lack of Transportation (Non-Medical): Not on file  Physical Activity:   . Days of Exercise per Week: Not on file  . Minutes of Exercise per Session: Not on file  Stress:   . Feeling of Stress : Not on file  Social Connections:   . Frequency of Communication with Friends and Family: Not on file  . Frequency of Social Gatherings with Friends and Family: Not on file  . Attends Religious Services: Not on file  . Active Member of Clubs or Organizations: Not on file  . Attends Archivist Meetings: Not on file  . Marital Status: Not on file  Intimate Partner Violence:   . Fear of Current  or Ex-Partner: Not on file  . Emotionally Abused: Not on file  . Physically Abused: Not on file  . Sexually Abused: Not on file   Review of Systems Bowels have been moving okay---as long as he stays active Appetite is good Weight is stable Wears seat belt Sleeps well. Sleeps in bed with 1  pillow. No PND. Voids okay. Nocturia x 1.  Keeps up with dentist    Objective:   Physical Exam  Constitutional: He is oriented to person, place, and time. He appears well-developed. No distress.  Neck: No thyromegaly present.  Cardiovascular: Normal rate. Exam reveals no gallop.  No murmur heard. Slightly irregular Faint pedal pulses  Respiratory: Effort normal. No respiratory distress. He has no rales.  Decreased breath sounds Slight wheeze when bending over for socks  GI: Soft. There is no abdominal tenderness.  Musculoskeletal:        General: No tenderness.     Comments: Trace ankle edema  Lymphadenopathy:    He has no cervical adenopathy.  Neurological: He is alert and oriented to person, place, and time.  President--- "Edmon Crape, Trump, Obama, Bush" [phone] [id number] Recall 3/3  Skin: No rash noted. No erythema.  Psychiatric: He has a normal mood and affect. His behavior is normal.           Assessment & Plan:

## 2019-07-02 NOTE — Assessment & Plan Note (Signed)
Not really better with the extensive therapy  Walks with rollator

## 2019-07-02 NOTE — Assessment & Plan Note (Signed)
Has been abstinent for 40 years or so

## 2019-07-02 NOTE — Progress Notes (Signed)
Hearing Screening   125Hz  250Hz  500Hz  1000Hz  2000Hz  3000Hz  4000Hz  6000Hz  8000Hz   Right ear:   20 20 40  0    Left ear:   20 20 20   0    Vision Screening Comments: Last eye exam July or August 2020.

## 2019-07-02 NOTE — Assessment & Plan Note (Signed)
Continues with Dr Weston Anna phone visits lately Continues on the escitalopram and that is helping

## 2019-07-02 NOTE — Assessment & Plan Note (Signed)
This seems better Has cut back on tylenol now--only using it prn now

## 2019-07-03 ENCOUNTER — Other Ambulatory Visit: Payer: Self-pay | Admitting: *Deleted

## 2019-07-03 MED ORDER — ATORVASTATIN CALCIUM 80 MG PO TABS: 80.0000 mg | ORAL_TABLET | Freq: Every day | ORAL | 3 refills | Status: DC

## 2019-07-06 ENCOUNTER — Ambulatory Visit: Payer: PPO

## 2019-07-07 ENCOUNTER — Ambulatory Visit (INDEPENDENT_AMBULATORY_CARE_PROVIDER_SITE_OTHER)
Admission: RE | Admit: 2019-07-07 | Discharge: 2019-07-07 | Disposition: A | Discharge status: Home / Self Care | Payer: PPO | Source: Ambulatory Visit | Attending: Family Medicine | Admitting: Family Medicine

## 2019-07-07 ENCOUNTER — Other Ambulatory Visit: Payer: Self-pay

## 2019-07-07 ENCOUNTER — Ambulatory Visit (INDEPENDENT_AMBULATORY_CARE_PROVIDER_SITE_OTHER): Payer: PPO | Admitting: Family Medicine

## 2019-07-07 ENCOUNTER — Encounter: Payer: Self-pay | Admitting: Family Medicine

## 2019-07-07 DIAGNOSIS — S4991XA Unspecified injury of right shoulder and upper arm, initial encounter: Secondary | ICD-10-CM

## 2019-07-07 DIAGNOSIS — S41101D Unspecified open wound of right upper arm, subsequent encounter: Secondary | ICD-10-CM | POA: Insufficient documentation

## 2019-07-07 DIAGNOSIS — S5001XA Contusion of right elbow, initial encounter: Secondary | ICD-10-CM | POA: Diagnosis not present

## 2019-07-07 DIAGNOSIS — M79631 Pain in right forearm: Secondary | ICD-10-CM | POA: Diagnosis not present

## 2019-07-07 NOTE — Assessment & Plan Note (Signed)
S/p blunt injury from handle of an exercise machine in pt taking anticoagulant  Hematoma on exam with mild tenderness but good rom  XR reassuring ( linear lucency of radius thought to represent a nutrient foramen)  Recommend ice/relative rest and elevation (mild compression if helpful) and acetaminophen prn Consider serial films or CT if no further improvement Suspect hematoma will take some time to resolve

## 2019-07-07 NOTE — Progress Notes (Signed)
Subjective:    Patient ID: Eduardo [name], male    DOB: 1930-06-23, 86 y.o.   MRN: [id number]  This visit occurred during the SARS-CoV-2 public health emergency.  Safety protocols were in place, including screening questions prior to the visit, additional usage of staff PPE, and extensive cleaning of exam room while observing appropriate contact time as indicated for disinfecting solutions.    HPI 86 yo pt of Dr [name] presents with R arm injury  R arm is contused   Working with a new cross trainer - 2 days ago  Hit forearm on the handle -it really hurt Used ice pack   Bruised up -extends to above elbow  Has a lump he can feel   Hurts most just distal to lateral elbow No numbness   Hurts to grip but no loss of strength  Hurts to flex forearm and pronate/supination   Films today: DG Elbow Complete Right  Result Date: 07/07/2019 CLINICAL DATA:  Elbow pain with bruising EXAM: RIGHT ELBOW - COMPLETE 3+ VIEW COMPARISON:  None. FINDINGS: There is no evidence of fracture, dislocation, or joint effusion. There is no evidence of arthropathy or other focal bone abnormality. Soft tissues are unremarkable. IMPRESSION: Negative. Electronically Signed   By: Donavan Foil M.D.   On: 07/07/2019 15:52   DG Forearm Right  Addendum Date: 07/07/2019   ADDENDUM REPORT: 07/07/2019 16:17 ADDENDUM: Request made to re-evaluate shaft of the radius. No definitive fracture is seen. On AP view, there is a linear lucency at the mid shaft of the radius on the ulnar side, suspect that this represents a nutrient foramen. No corresponding abnormality on the lateral view. Similar oblique lucency at the cortex of the volar ulna on lateral view, also suspect nutrient foramen. If fracture suspicion remains high or point tender to these regions, options for management include immobilization with repeat radiographs in 10-14 days versus CT evaluation. Electronically Signed   By: Donavan Foil M.D.   On: 07/07/2019  16:17   Result Date: 07/07/2019 CLINICAL DATA:  Pain swelling and bruising EXAM: RIGHT FOREARM - 2 VIEW COMPARISON:  None. FINDINGS: No fracture or malalignment. Vascular calcifications. Arthritis at the distal radial carpal joint. Calcifications at the triangular fibrocartilage. IMPRESSION: No acute osseous abnormality. Electronically Signed: By: Donavan Foil M.D. On: 07/07/2019 15:52     Patient Active Problem List   Diagnosis Date Noted  . Arm injury, right, initial encounter 07/07/2019  . GERD (gastroesophageal reflux disease) 07/02/2019  . CAD (coronary artery disease), native coronary artery 12/28/2017  . Bilateral carotid artery stenosis 12/04/2017  . Alcohol dependence in remission ([place]) 06/24/2017  . Imbalance 02/05/2017  . Mitral regurgitation 07/17/2016  . Dyslipidemia 07/14/2016  . Encounter for anticoagulation discussion and counseling 05/03/2015  . Chronic anticoagulation 03/01/2015  . 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 TIA (transient ischemic attack) 12/31/2014  . Advanced directives, counseling/discussion 04/19/2014  . Spinal stenosis, lumbar region, with neurogenic claudication 12/23/2012  . Osteoarthritis, multiple sites 10/08/2012  . Preventative health care 02/25/2012  . MI, old with prior NSTEMI x 2 12/28/2011  . Chronic diastolic HF (heart failure) ([place]) 12/28/2011    Class: Acute  . Edema 09/20/2010  . Allergic rhinitis 11/11/2008  . Mood disorder ([place]) 04/14/2008  . Essential hypertension 04/14/2008  . Chronic atrial fibrillation ([place]) 04/14/2008  . COPD GOLD II B 04/14/2008   Past Medical History:  Diagnosis Date  . Allergy   . Anemia 2012, 08/2015   chronic. Acute  due to large hematoma 2013. 2017: due to gastric AVM bleeding.   . Anxiety   . CAD (coronary artery disease)    a. 06/2010 Cath: LM nl, LAD 50p/m, LCX 29m, RCA 50p/m -->catheter dissection but good flow, 70d, RPDA 50-70;  b. 06/2010 Relook cath due to bradycardia and  inf ST elev: RCA dissection @ ostium extending into coronary cusp and prox RCA, Ao dissection-->less staining than previously-->Med Rx; c. 08/2018 MV: No ischemia.  . Chronic combined systolic (congestive) and diastolic (congestive) heart failure ([place])    a. 12/2014 Echo: EF 60-65%; b. 07/2016 Echo: EF 45-50%, mild LVH. Mild AS/MS. Mod-sev MR. Mild to mod TR. PASP 31mmHg; c. 08/2018 Echo: EF 55-60%, RVSP 68.25mmHg. Sev dil LA, mod dil RA. at least mod MR, mild to mod TR. Mild AS.  Marland Kitchen COPD (chronic obstructive pulmonary disease) ([place]) 2013   exertional dyspnea  . Gastric angiodysplasia with hemorrhage 08/2015  . Hx of colonic polyps 2001, 2011   adenomatous 2001. HP 2001, 2011.  Marland Kitchen Hyperlipidemia   . Mitral regurgitation    a. 07/2016 Echo: Mod to sev MR; b. 08/2018 Echo: at least moderate MR.  . Myocardial infarction ([place]) 06/2010.  12/2011.   "twice; 1 day apart; during/after cath". NQMI in setting severe anemia 2013.   Marland Kitchen Neuropathy 2014   in feet, likely due to spinal stenosis, DDD, HNP  . Osteoarthritis 2002   spinal stenosis, spondylolisthesis, disc protrusion multilevel in lumbar spine  . Permanent atrial fibrillation ([place]) 2012   a.  CHA2DS2VASc = 6-->eliquis;  b. 12/2015 Echo: EF 60-65%, no rwma, LVH, mild AS/MS/MR, sev dil LA, mild TR, PASP 35mmHg.  . Stroke ([place]) 2016  . Upper GI bleed 08/2015   EGD: 2 small gastric AVMs, 1 actively bleeding.  Both treated with APC ablation, clipping of the bleeder   Past Surgical History:  Procedure Laterality Date  . APPENDECTOMY    . CARDIAC CATHETERIZATION  2012   50% LAD and luminal irreg in RCA, 1/12  Post cath MI from damage  . CARPAL TUNNEL RELEASE     left  . CARPAL TUNNEL RELEASE  10/11/11   Dr Rush Barer  . CATARACT EXTRACTION W/ INTRAOCULAR LENS IMPLANT  2/13   Dr [name]  . COLONOSCOPY  2001, 02/2010   Dr Deatra Ina.   . ESOPHAGOGASTRODUODENOSCOPY N/A 08/15/2015   [name] MD; 2 small gastric AVMs, 1 actively bleeding.  Both  treated with APC ablation, clipping of the bleeder  . FOOT FRACTURE SURGERY     right heel repair  . FRACTURE SURGERY    . KNEE ARTHROSCOPY     left, right knee 10/2009  . MASTOIDECTOMY     x3 on right  . NASAL SEPTUM SURGERY     nasal septal repair  . PENILE PROSTHESIS  REMOVAL    . PENILE PROSTHESIS IMPLANT     x 2--got infection after 2nd and had to remove  . PILONIDAL CYST / SINUS EXCISION    . SHOULDER ARTHROSCOPY     right  . TONSILLECTOMY AND ADENOIDECTOMY     Social History   Tobacco Use  . Smoking status: Former Smoker    Packs/day: 2.50    Years: 30.00    Pack years: 75.00    Types: Cigarettes    Quit date: 06/11/1978    Years since quitting: 41.0  . Smokeless tobacco: Never Used  Substance Use Topics  . Alcohol use: No    Comment: QUIT [id number] alcoholic  .  Drug use: No   Family History  Problem Relation Age of Onset  . Cancer Mother        ? leukemia  . Heart attack Father   . Stroke Maternal Grandmother   . Diabetes Neg Hx   . Hypertension Neg Hx    Allergies  Allergen Reactions  . Penicillins Itching    Has patient had a PCN reaction causing immediate rash, facial/tongue/throat swelling, SOB or lightheadedness with hypotension: Yes Has patient had a PCN reaction causing severe rash involving mucus membranes or skin necrosis: No Has patient had a PCN reaction that required hospitalization No Has patient had a PCN reaction occurring within the last 10 years: No If all of the above answers are "NO", then may proceed with Cephalosporin use.   . Sulfonamide Derivatives Other (See Comments)    "it affected my kidneys"  . Tape Other (See Comments)    Arms black and blue, tears skin.  Please use "paper" tape   Current Outpatient Medications on File Prior to Visit  Medication Sig Dispense Refill  . albuterol (PROVENTIL) (2.5 MG/3ML) 0.083% nebulizer solution Use 1 vial in nebulizer every 4 hours as needed for wheezing or shortness of breath 270 mL 2  .  ALPRAZolam (XANAX) 0.25 MG tablet Take 1 tablet (0.25 mg total) by mouth 2 (two) times daily as needed for anxiety. 20 tablet 0  . atorvastatin (LIPITOR) 80 MG tablet Take 1 tablet (80 mg total) by mouth daily. 90 tablet 3  . B Complex-C (SUPER B COMPLEX PO) Take 1 tablet by mouth at bedtime.     . cetirizine (ZYRTEC) 10 MG tablet Take 10 mg by mouth at bedtime.     . cholecalciferol (VITAMIN D) 1000 units tablet Take 1,000 Units by mouth daily.    Marland Kitchen ELIQUIS 5 MG TABS tablet TAKE ONE TABLET BY MOUTH TWICE DAILY  180 tablet 0  . escitalopram (LEXAPRO) 20 MG tablet Take 20 mg by mouth at bedtime.     . fenofibrate 160 MG tablet TAKE ONE TABLET BY MOUTH DAILY AT BEDTIME  90 tablet 3  . furosemide (LASIX) 40 MG tablet Take 1 tablet (40 mg total) by mouth daily. 90 tablet 3  . hydrALAZINE (APRESOLINE) 10 MG tablet TAKE ONE TABLET BY MOUTH THREE TIMES DAILY  270 tablet 0  . HYDROcodone-acetaminophen (NORCO/VICODIN) 5-325 MG tablet Take 1 tablet by mouth every 4 (four) hours as needed for moderate pain.    Marland Kitchen ipratropium (ATROVENT) 0.03 % nasal spray PLACE 2 SPRAYS INTO BOTH NOSTRILS AT BEDTIME 30 mL 2  . lamoTRIgine (LAMICTAL) 150 MG tablet Take 150 mg by mouth at bedtime. Reported on 08/14/2015    . montelukast (SINGULAIR) 10 MG tablet TAKE ONE TABLET BY MOUTH DAILY AT BEDTIME  90 tablet 0  . Multiple Vitamin (MULTIVITAMIN) tablet Take 1 tablet by mouth at bedtime.     . pantoprazole (PROTONIX) 40 MG tablet TAKE 1 TABLET BY MOUTH DAILY BEFORE BREAKFAST. 90 tablet 3  . STIOLTO RESPIMAT 2.5-2.5 MCG/ACT AERS INHALE TWO PUFFS BY MOUTH INTO THE LUNGS DAILY 12 g 0  . triamcinolone cream (KENALOG) 0.1 % Apply 1 application topically 2 (two) times daily as needed (for skin).     . verapamil (CALAN-SR) 240 MG CR tablet TAKE ONE TABLET BY MOUTH AT BEDTIME 90 tablet 0  . nitroGLYCERIN (NITROSTAT) 0.4 MG SL tablet Place 1 tablet (0.4 mg total) under the tongue every 5 (five) minutes as needed for chest pain. [place]  tablet 3   No current facility-administered medications on file prior to visit.    Review of Systems  Constitutional: Negative for activity change, appetite change, fatigue, fever and unexpected weight change.  HENT: Negative for congestion, rhinorrhea, sore throat and trouble swallowing.   Eyes: Negative for pain, redness, itching and visual disturbance.  Respiratory: Negative for cough, chest tightness, shortness of breath and wheezing.   Cardiovascular: Negative for chest pain and palpitations.  Gastrointestinal: Negative for abdominal pain, blood in stool, constipation, diarrhea and nausea.  Endocrine: Negative for cold intolerance, heat intolerance, polydipsia and polyuria.  Genitourinary: Negative for difficulty urinating, dysuria, frequency and urgency.  Musculoskeletal: Positive for arthralgias. Negative for joint swelling and myalgias.       Pain and swelling in RUE after trauma  Skin: Negative for pallor and rash.  Neurological: Negative for dizziness, tremors, weakness, numbness and headaches.  Hematological: Negative for adenopathy. Does not bruise/bleed easily.  Psychiatric/Behavioral: Negative for decreased concentration and dysphoric mood. The patient is not nervous/anxious.        Objective:   Physical Exam Constitutional:      Appearance: Normal appearance. He is normal weight. He is not ill-appearing or diaphoretic.  HENT:     Head: Normocephalic and atraumatic.  Eyes:     General: No scleral icterus.    Conjunctiva/sclera: Conjunctivae normal.     Pupils: Pupils are equal, round, and reactive to light.  Cardiovascular:     Rate and Rhythm: Normal rate.     Pulses: Normal pulses.  Pulmonary:     Effort: Pulmonary effort is normal. No respiratory distress.     Breath sounds: Normal breath sounds. No wheezing or rales.  Musculoskeletal:        General: Swelling, tenderness and signs of injury present. No deformity.     Right forearm: Swelling and tenderness  present. No deformity or lacerations.     Cervical back: Normal range of motion.     Comments: RUE- swelling distal to elbow with significant hematoma  Tender laterally just distal to elbow  Nl rom incl supination/pronation (mild discomfort)  Nl grip and wrist rom  No skin interruption or sign of infection   Lymphadenopathy:     Cervical: No cervical adenopathy.  Skin:    General: Skin is warm and dry.     Coloration: Skin is not pale.     Findings: Bruising present.  Neurological:     Mental Status: He is alert.     Sensory: No sensory deficit.  Psychiatric:        Mood and Affect: Mood normal.           Assessment & Plan:   Problem List Items Addressed This Visit      Other   Arm injury, right, initial encounter    S/p blunt injury from handle of an exercise machine in pt taking anticoagulant  Hematoma on exam with mild tenderness but good rom  XR reassuring ( linear lucency of radius thought to represent a nutrient foramen)  Recommend ice/relative rest and elevation (mild compression if helpful) and acetaminophen prn Consider serial films or CT if no further improvement Suspect hematoma will take some time to resolve      Relevant Orders   DG Forearm Right (Completed)   DG Elbow Complete Right (Completed)

## 2019-07-07 NOTE — Patient Instructions (Signed)
Xray now  Continue ice  Baby your arm / elevate when able   We will contact you with a reading   I suspect a hematoma -it will take a while to get better  If sudden severe pain call us asap

## 2019-07-08 ENCOUNTER — Encounter: Payer: PPO | Admitting: *Deleted

## 2019-07-08 DIAGNOSIS — I5032 Chronic diastolic (congestive) heart failure: Secondary | ICD-10-CM

## 2019-07-08 NOTE — Progress Notes (Signed)
Completed virtual follow up today.  Next follow up on 2/10 at [place] is doing well at home.  They purchased a [place], a recumbent stepper, for him to use at home.  He was very excited about it.  Unfortunately, it has already gotten the best of him as he fell on one of its arms.  He has a large bruise but nothing is broken.  He is using it for 30 min a day normally.  He is doing well with his weight and pressures.  His biggest compliant is his balance that he continues to work on.  He is scheduled to get his second vaccine dose on 2/9 and dental work later in February.  Overall, he is doing well.   Breathing is going well and no complaints.  He is still using his PLB to help.

## 2019-07-16 ENCOUNTER — Ambulatory Visit: Payer: PPO

## 2019-07-16 ENCOUNTER — Telehealth: Payer: Self-pay | Admitting: Internal Medicine

## 2019-07-16 NOTE — Chronic Care Management (AMB) (Signed)
Chronic Care Management Pharmacy  Name: Eduardo [name]  MRN: [id number] DOB: 08/12/1930  Chief Complaint/ HPI  Eduardo [name],  86 y.o., male presents for their Initial CCM visit with the clinical pharmacist via telephone.  PCP : Venia Carbon, MD   Allergies: patient confirmed  Penicillin (Itching)  Sulfonamide (kidney function decline)  Tape  Specialists: patient confirmed  Cardiology - Gollen/Berge (Primary) Bensimhon (Surgery consult - pulmonary HTN)  Pulmonology -  Byrum (every 6 months)  Psychiatry - Kupar (every 6 months)  Their chronic conditions include: Heart failure (diastolic), GERD, Mood disorder, AFIB, COPD, imbalance, HTN, dyslipidemia, CAD, allergic rhinitis, osteoarthritis  Office Visits:  07/07/19 [facility] - Arm injury from exercise machine with hematoma, rest, ice, elevation, tylenol PRN, x-ray today  07/02/19 [name], AWV - GERD, reduce protonix to every other day  Consult Visits:  06/22/19 [name], Cardiology - Pulmonary HTN, no medication changes, holding off on surgery for now   03/19/19 [name], Cardiology - Increase lasix 40 mg to BID for three days, active in pulmonary and cardiac rehab  Medications: Outpatient Encounter Medications as of 07/17/2019  Medication Sig  . albuterol (PROVENTIL) (2.5 MG/3ML) 0.083% nebulizer solution Use 1 vial in nebulizer every 4 hours as needed for wheezing or shortness of breath  . ALPRAZolam (XANAX) 0.25 MG tablet Take 1 tablet (0.25 mg total) by mouth 2 (two) times daily as needed for anxiety.  Marland Kitchen atorvastatin (LIPITOR) 80 MG tablet Take 1 tablet (80 mg total) by mouth daily.  . B Complex-C (SUPER B COMPLEX PO) Take 1 tablet by mouth at bedtime.   . cetirizine (ZYRTEC) 10 MG tablet Take 10 mg by mouth at bedtime.   . cholecalciferol (VITAMIN D) 1000 units tablet Take 1,000 Units by mouth daily.  Marland Kitchen ELIQUIS 5 MG TABS tablet TAKE ONE TABLET BY MOUTH TWICE DAILY   . escitalopram (LEXAPRO)  20 MG tablet Take 20 mg by mouth at bedtime.   . fenofibrate 160 MG tablet TAKE ONE TABLET BY MOUTH DAILY AT BEDTIME   . furosemide (LASIX) 40 MG tablet Take 1 tablet (40 mg total) by mouth daily.  . hydrALAZINE (APRESOLINE) 10 MG tablet TAKE ONE TABLET BY MOUTH THREE TIMES DAILY   . HYDROcodone-acetaminophen (NORCO/VICODIN) 5-325 MG tablet Take 1 tablet by mouth every 4 (four) hours as needed for moderate pain.  Marland Kitchen ipratropium (ATROVENT) 0.03 % nasal spray PLACE 2 SPRAYS INTO BOTH NOSTRILS AT BEDTIME  . lamoTRIgine (LAMICTAL) 150 MG tablet Take 150 mg by mouth at bedtime. Reported on 08/14/2015  . montelukast (SINGULAIR) 10 MG tablet TAKE ONE TABLET BY MOUTH DAILY AT BEDTIME   . Multiple Vitamin (MULTIVITAMIN) tablet Take 1 tablet by mouth at bedtime.   . nitroGLYCERIN (NITROSTAT) 0.4 MG SL tablet Place 1 tablet (0.4 mg total) under the tongue every 5 (five) minutes as needed for chest pain.  . pantoprazole (PROTONIX) 40 MG tablet TAKE 1 TABLET BY MOUTH DAILY BEFORE BREAKFAST.  Marland Kitchen STIOLTO RESPIMAT 2.5-2.5 MCG/ACT AERS INHALE TWO PUFFS BY MOUTH INTO THE LUNGS DAILY  . triamcinolone cream (KENALOG) 0.1 % Apply 1 application topically 2 (two) times daily as needed (for skin).   . verapamil (CALAN-SR) 240 MG CR tablet TAKE ONE TABLET BY MOUTH AT BEDTIME   No facility-administered encounter medications on file as of 07/17/2019.   Medication concerns: none, denies missed doses, denies concerns about adverse effects, fills pill box every Thursday  Current Diagnosis/Assessment: Goals    . Pharmacy Care Plan  Current Barriers:  . Chronic Disease Management support, education, and care coordination needs related to hypertension, COPD, heart failure  Pharmacist Clinical Goal(s):  Marland Kitchen Patient will check weight daily and contact physician if weight gain of more than 3 pounds in one day or 5 pounds in 1 week . Patient will return to clinic on March 2nd to assess accuracy of home blood pressure  monitor  Interventions: . Comprehensive medication review performed.  Patient Self Care Activities:  . Patient verbalizes understanding of plan to return to clinic for blood pressure monitor evaluation, Self administers medications as prescribed, and Calls pharmacy for medication refills  Initial goal documentation        AFIB  Managed by cardiology, Dr. Karl Bales Patient is currently rate controlled HR: Denies home HR < 60 or >100 Patient has failed these meds in past: metoprolol (unknown reason for discontinuation) Patient is currently on the following medications:   Eliquis 5 mg - take one tablet twice daily  Verapamil 40 mg - take one tablet at bedtime  We discussed: signs/symptoms of abnormal brusing and bleeding, adherence  Plan: Continue current medications  COPD [name] /Allergies  Managed by pulmonology, Dr. Lamonte Sakai Last spirometry score:09/18/10 69% Gold Grade: Gold 2 (FEV1 50-79%) Current COPD Classification:  B (high sx, <2 exacerbations/yr)  Eosinophil count:   Lab Results  Component Value Date/Time   EOSPCT 4 03/24/2018 04:31 PM  %                               Eos (Absolute):  Lab Results  Component Value Date/Time   EOSABS 0.3 03/24/2018 04:31 PM    Tobacco Status:  Social History   Tobacco Use  Smoking Status Former Smoker  . Packs/day: 2.50  . Years: 30.00  . Pack years: 75.00  . Types: Cigarettes  . Quit date: 06/11/1978  . Years since quitting: 41.1  Smokeless Tobacco Never Used    Patient has failed these meds in past: none reported Patient is currently controlled on the following medications:   Stiolto Respimat - inhale 2 puffs in the lungs daily  Albuterol inhaler - as needed  Albuterol nebulizer 0.083% - as needed  Montelukast 150 mg - take once daily at bedtime  Cetirizine 10 mg - take one tablet daily  Ipratropium nasal spray (Atrovent) - 2 sprays in each nostril at bedtime  Using maintenance inhaler regularly? Yes Frequency  of rescue inhaler use: 2-3x a week Breathing exercises: breathes through nose and exhales through mouth to reduce albuterol use Inhaler technique: inhales 2 puffs every night, inhales as fully as he can, waits 2 minutes, repeats We discussed: proper inhaler technique; reviewed breathing in slowly and holding breath for 10 seconds  Plan: Continue current medications. Bring inhaler to next appointment to review technique in person. ,  Heart Failure  Type: Diastolic  Last ejection fraction: 08/2018 55-60% NYHA Class: III (marked limitation of activity) AHA HF Stage: C (Heart disease and symptoms present)  Patient has failed these meds in past: none reported Patient is currently controlled on the following medications:   Lasix 40 mg - take one tablet daily  We discussed weighing daily; if you gain more than 3 pounds in one day or 5 pounds in one week call your doctor - reports weight loss of 5 lbs in the last week, encouraged daily weights Plan: Continue current medications. Check weight daily and contact doctor for weight gain of more  than 3 pounds in one day or 5 pounds in one week.   Hypertension  BP goal: <130/80 mmHg  Office blood pressures are  BP Readings from Last 3 Encounters:  07/07/19 132/74  07/02/19 (!) 152/70  06/22/19 134/60   BMP Latest Ref Rng & Units 03/19/2019 08/12/2018 08/07/2018  Glucose 70 - 99 mg/dL 100(H) 128(H) 104(H)  BUN 8 - 23 mg/dL 37(H) 26 35(H)  Creatinine 0.61 - 1.24 mg/dL 1.01 0.95 1.39(H)  BUN/Creat Ratio 10 - 24 - 27(H) 25(H)  Sodium 135 - 145 mmol/L 141 141 142  Potassium 3.5 - 5.1 mmol/L 3.9 3.7 4.0  Chloride 98 - 111 mmol/L 106 101 102  CO2 22 - 32 mmol/L 26 24 24   Calcium 8.9 - 10.3 mg/dL 9.9 9.4 10.0   Patient has failed these meds in the past: none reported Patient does not check BP at home. Reports unsure if his home monitors are accurate.  Patient is currently uncontrolled on the following medications:   Hydralazine 10 mg - take one  tablet three times daily  Verapamil 40 mg - take one tablet at bedtime  We discussed diet and exercise extensively Plan: Continue current medications. Plan face to face appointment to check accuracy of home blood pressure monitor.   Anxiety/Mood Disorder  Patient has failed these meds in past: none reported Patient is currently controlled on the following medications:   Escitalopram 20 mg - take one tablet daily  Alprazolam 0.25 mg - take one tablet twice daily as needed  Lamotrigine 150 mg - take one tablet daily at bedtime  We discussed: patient only uses alprazolam for extreme anxiety, maybe once a month Plan: Continue current medications  GERD  Patient has failed these meds in past: None reported Patient is currently controlled on the following medications:  Pantoprazole 40 mg - take one tablet every other day (reduced from every daily in January)  We discussed:Patient is doing well with every other day dosing, denies reoccurrence of symptoms. Plan: Continue current medications. Consider continuing taper.  CAD  Managed by cardiology, Dr. Karl Bales Patient has failed these meds in the past: reports low HR with unknown medication - maybe metoprolol per history, no aspirin per cardio due to Eliquis PMH: NSTEMI, Stroke Patient is currently on the following appropriate medications:   Atorvastatin 80 mg - once daily at bedtime  Nitroglycerin 0.4 mg SL - PRN  We discussed:  Patient has not had to use nitroglycerin recently Plan: Continue current medications   Hypercholesterolemia  Lipid Panel     Component Value Date/Time   CHOL 132 06/24/2017 1709   TRIG (H) 06/24/2017 1709    1031.0 Triglyceride is over 400; calculations on Lipids are invalid.   HDL 44.80 06/24/2017 1709   CHOLHDL 3 06/24/2017 1709   VLDL UNABLE TO CALCULATE IF TRIGLYCERIDE OVER 400 mg/dL 12/31/2014 0510   LDLCALC UNABLE TO CALCULATE IF TRIGLYCERIDE OVER 400 mg/dL 12/31/2014 0510   LDLDIRECT 69.0  06/24/2017 1709   Patient has failed these meds in past: fish oil (did not lower TG), fenofibrate seems to have been restarted in 2014, with an initial decrease in TG (~200), but TG have continued to rise afterwards. Triglycerides > 1000 on 06/24/17. Patient is currently uncontrolled on the following medications:   Atorvastatin 80 mg - once daily at bedtime  Fenofibrate 160 mg - once daily with largest meal  Plan: Continue current medications. Patient has chronically elevated TG and has failed fish oil, denied additional treatment previously. Unclear  if fenofibrate is providing benefit. Recommend updating lipid panel, > 2 years since last panel.  Osteoarthritis  Patient has failed these meds in past: none reported Patient is currently controlled on the following medications:  Hydrocodone-acetaminophen 5-325 mg - q4h PRN  Plan: Continue current medications    Medications not listed above:  OTCs: multivitamin, Super B-complex, vitamin D 1000 IU daily, vitamin C  Misc: triamcinolone 1% - apply twice daily (uses regularly for itchy rash)  Pharmacy: Dionne Milo, [place], interesed in personal management of medications through [facility], would like to discuss with wife first  Vaccines: Influenza, COVID-19 2nd dose next week, PCV13, PPSV23, Td 2012, Shingrix - all up to date Exercise: uses walker reguarly; exercises daily with home cross over machine, lifting weights (8 lbs), Apple watch reminds him to break up long periods of sitting  Current weight: patient reports 135 lbs (today); 140 lbs last week   CCM Follow up: Tuesday, March 2nd, in office at [place], PharmD Clinical Pharmacist [facility] at [facility] [phone]

## 2019-07-16 NOTE — Chronic Care Management (AMB) (Signed)
  Chronic Care Management   Note  07/16/2019 Name: Eduardo [name] MRN: [id number] DOB: 1931-04-01  Eduardo [name] is a 86 y.o. year old male who is a primary care patient of Venia Carbon, MD. I reached out to [name] by phone today in response to a referral sent by Eduardo [name] PCP, . Venia Carbon, MD  Eduardo [name] was given information about Chronic Care Management services today including:  1. CCM service includes personalized support from designated clinical staff supervised by his physician, including individualized plan of care and coordination with other care providers 2. 24/7 contact phone numbers for assistance for urgent and routine care needs. 3. Service will only be billed when office clinical staff spend 20 minutes or more in a month to coordinate care. 4. Only one practitioner may furnish and bill the service in a calendar month. 5. The patient may stop CCM services at any time (effective at the end of the month) by phone call to the office staff. 6. The patient will be responsible for cost sharing (co-pay) of up to 20% of the service fee (after annual deductible is met).  Patient agreed to services and verbal consent obtained.   Follow up plan:   Eduardo [name] [facility] Scheduler

## 2019-07-17 ENCOUNTER — Ambulatory Visit: Payer: PPO

## 2019-07-17 ENCOUNTER — Other Ambulatory Visit: Payer: Self-pay

## 2019-07-17 DIAGNOSIS — J301 Allergic rhinitis due to pollen: Secondary | ICD-10-CM

## 2019-07-17 DIAGNOSIS — I10 Essential (primary) hypertension: Secondary | ICD-10-CM

## 2019-07-17 DIAGNOSIS — I482 Chronic atrial fibrillation, unspecified: Secondary | ICD-10-CM

## 2019-07-17 DIAGNOSIS — I5032 Chronic diastolic (congestive) heart failure: Secondary | ICD-10-CM

## 2019-07-17 DIAGNOSIS — J439 Emphysema, unspecified: Secondary | ICD-10-CM

## 2019-07-17 DIAGNOSIS — K219 Gastro-esophageal reflux disease without esophagitis: Secondary | ICD-10-CM

## 2019-07-17 DIAGNOSIS — M159 Polyosteoarthritis, unspecified: Secondary | ICD-10-CM

## 2019-07-17 DIAGNOSIS — I2510 Atherosclerotic heart disease of native coronary artery without angina pectoris: Secondary | ICD-10-CM

## 2019-07-17 DIAGNOSIS — F39 Unspecified mood [affective] disorder: Secondary | ICD-10-CM

## 2019-07-17 DIAGNOSIS — E785 Hyperlipidemia, unspecified: Secondary | ICD-10-CM

## 2019-07-18 ENCOUNTER — Other Ambulatory Visit: Payer: Self-pay | Admitting: Cardiovascular Disease

## 2019-07-20 ENCOUNTER — Ambulatory Visit: Payer: PPO

## 2019-07-20 ENCOUNTER — Other Ambulatory Visit: Payer: Self-pay | Admitting: Emergency Medicine

## 2019-07-20 NOTE — Patient Instructions (Addendum)
Dear Eduardo [name],  It was a pleasure meeting you during our initial appointment on July 17, 2019. Below is a summary of the goals we discussed and components of chronic care management. Please contact me anytime with questions or concerns.   Goals Addressed            This Visit's Progress   . Pharmacy Care Plan       Current Barriers:  . Chronic Disease Management support, education, and care coordination needs related to hypertension, COPD, heart failure  Pharmacist Clinical Goal(s):  Marland Kitchen Patient will check weight daily and contact physician if weight gain of more than 3 pounds in one day or 5 pounds in 1 week . Patient will return to clinic on March 2nd to assess accuracy of home blood pressure monitor  Interventions: . Comprehensive medication review performed.  Patient Self Care Activities:  . Patient verbalizes understanding of plan to return to clinic for blood pressure monitor evaluation, Self administers medications as prescribed, and Calls pharmacy for medication refills  Initial goal documentation        [name] was given information about Chronic Care Management services today including:  1. CCM service includes personalized support from designated clinical staff supervised by his physician, including individualized plan of care and coordination with other care providers 2. 24/7 contact phone numbers for assistance for urgent and routine care needs. 3. Service will only be billed when office clinical staff spend 20 minutes or more in a month to coordinate care. 4. Only one practitioner may furnish and bill the service in a calendar month. 5. The patient may stop CCM services at any time (effective at the end of the month) by phone call to the office staff. 6. The patient will be responsible for cost sharing (co-pay) of up to 20% of the service fee (after annual deductible is met).  Patient agreed to services and verbal consent obtained.   Face to Face  appointment with pharmacist scheduled for: March 2nd, 2021, 10:00 AM  Sincerely,  Debbora Dus, PharmD Clinical Pharmacist [facility] at [facility] [phone]

## 2019-07-21 ENCOUNTER — Other Ambulatory Visit: Payer: Self-pay

## 2019-07-21 DIAGNOSIS — J439 Emphysema, unspecified: Secondary | ICD-10-CM

## 2019-07-21 DIAGNOSIS — E785 Hyperlipidemia, unspecified: Secondary | ICD-10-CM

## 2019-07-21 DIAGNOSIS — I10 Essential (primary) hypertension: Secondary | ICD-10-CM

## 2019-07-21 DIAGNOSIS — I5032 Chronic diastolic (congestive) heart failure: Secondary | ICD-10-CM

## 2019-07-22 ENCOUNTER — Other Ambulatory Visit: Payer: Self-pay

## 2019-07-22 ENCOUNTER — Encounter: Payer: PPO | Attending: Internal Medicine | Admitting: *Deleted

## 2019-07-22 DIAGNOSIS — I5030 Unspecified diastolic (congestive) heart failure: Secondary | ICD-10-CM | POA: Insufficient documentation

## 2019-07-22 DIAGNOSIS — I5032 Chronic diastolic (congestive) heart failure: Secondary | ICD-10-CM

## 2019-07-22 NOTE — Progress Notes (Signed)
Completed virtual visit today.  Next follow up is 2/24 to graduate.   Eduardo [name] had his second dose yesterday for COVID vaccine.  He said he had a rough night but only because his arm was very sore and he could not get comfortable.  He did almost fall the other day, but was able to catch himself.   He has been using his teeter everyday.  He is enjoying it and feels stronger.   His weight and blood pressure have been good.  He did get a call from [facility] pharmacist for about one hour and they want to check his heart out some more on 3/3.  We talked about him coming to cardiac if he needs to when things calm down.

## 2019-07-29 ENCOUNTER — Ambulatory Visit (INDEPENDENT_AMBULATORY_CARE_PROVIDER_SITE_OTHER)
Admission: RE | Admit: 2019-07-29 | Discharge: 2019-07-29 | Disposition: A | Discharge status: Home / Self Care | Payer: PPO | Source: Ambulatory Visit | Attending: Family Medicine | Admitting: Family Medicine

## 2019-07-29 ENCOUNTER — Other Ambulatory Visit: Payer: Self-pay

## 2019-07-29 ENCOUNTER — Encounter: Payer: Self-pay | Admitting: Family Medicine

## 2019-07-29 ENCOUNTER — Ambulatory Visit (INDEPENDENT_AMBULATORY_CARE_PROVIDER_SITE_OTHER): Payer: PPO | Admitting: Family Medicine

## 2019-07-29 VITALS — BP 132/60 | HR 75 | Temp 98.6°F | Ht 61.75 in | Wt 139.2 lb

## 2019-07-29 DIAGNOSIS — M79601 Pain in right arm: Secondary | ICD-10-CM

## 2019-07-29 DIAGNOSIS — M25421 Effusion, right elbow: Secondary | ICD-10-CM | POA: Diagnosis not present

## 2019-07-29 DIAGNOSIS — M25521 Pain in right elbow: Secondary | ICD-10-CM

## 2019-07-29 DIAGNOSIS — S4991XA Unspecified injury of right shoulder and upper arm, initial encounter: Secondary | ICD-10-CM | POA: Diagnosis not present

## 2019-07-29 DIAGNOSIS — M898X2 Other specified disorders of bone, upper arm: Secondary | ICD-10-CM

## 2019-07-29 DIAGNOSIS — S5001XA Contusion of right elbow, initial encounter: Secondary | ICD-10-CM

## 2019-07-29 DIAGNOSIS — M7989 Other specified soft tissue disorders: Secondary | ICD-10-CM | POA: Diagnosis not present

## 2019-07-29 DIAGNOSIS — M79631 Pain in right forearm: Secondary | ICD-10-CM | POA: Diagnosis not present

## 2019-07-29 DIAGNOSIS — R238 Other skin changes: Secondary | ICD-10-CM | POA: Diagnosis not present

## 2019-07-29 NOTE — Progress Notes (Signed)
Joby Hershkowitz T. Brissia Delisa, MD Primary Care and Sports Medicine [facility] at [facility] [place], [address] Phone: [phone]  FAX: [place] - 86 y.o. male  MRN [id number]  Date of Birth: 1930/12/09  Visit Date: 07/29/2019  PCP: Venia Carbon, MD  Referred by: Venia Carbon, MD  Chief Complaint  Patient presents with  . Fall    this morning  . Arm Injury    Right     This visit occurred during the SARS-CoV-2 public health emergency.  Safety protocols were in place, including screening questions prior to the visit, additional usage of staff PPE, and extensive cleaning of exam room while observing appropriate contact time as indicated for disinfecting solutions.   Subjective:   RITIK STAVOLA is a 86 y.o. very pleasant male patient who presents with the following:  [place] this AM:  DOI: 07/29/2019  Made a quick turn and lost his balance.  Now things it happens more. Landed directly on his R side and slid and shirt ripped the skin off.  Takes some Eliquis.  [place] a couple of weeks ago. Hematoma.   He landed directly on his elbow, and he did not hit his head at all.  Wife provides additional history.  She does not think he has had any change in his cognitive function.  He has pain essentially throughout much of the humerus distal humerus, proximal forearm, and some of the elbow.  He has a large hematoma as well as some bogginess in the olecranon.  He is denuded the skin at his elbow.  Large scale hematoma with denuded skin Pain mid forearm and distal humerus, R Moving elbow ok  Review of Systems is noted in the HPI, as appropriate  Objective:   BP 132/60   Pulse 75   Temp 98.6 F (37 C) (Temporal)   Ht 5' 1.75" (1.568 m)   Wt 139 lb 4 oz (63.2 kg)   SpO2 98%   BMI 25.68 kg/m   GEN: No acute distress; alert,appropriate. PULM: Breathing comfortably in no respiratory distress PSYCH: Normally  interactive.   His skin is denuded around his elbow on the right side.  He is a large very large hematoma.  He also has some olecranon swelling.  He is tender at the distal humerus as well as the midshaft through proximal forearm.  He is able to move his elbow in flexion and extension and pronation supination without much significant pain.  He is nontender throughout the bony anatomy of the distal radius and ulna as well as the carpal bones, and his entire hand.  There is ecchymosis throughout.      Laboratory and Imaging Data: DG Elbow Complete Right  Result Date: 07/29/2019 CLINICAL DATA:  Trauma secondary to a fall. Soft tissue hematoma. EXAM: RIGHT ELBOW - COMPLETE 3+ VIEW COMPARISON:  07/07/2019 FINDINGS: There is no evidence of fracture, dislocation, or joint effusion. There is no evidence of arthropathy or other focal bone abnormality. There is prominent soft tissue swelling at the posterolateral aspect of the elbow consistent with a hematoma. IMPRESSION: No acute bone abnormality. Soft tissue hematoma. Electronically Signed   By: Lorriane Shire M.D.   On: 07/29/2019 12:05   DG Forearm Right  Result Date: 07/29/2019 CLINICAL DATA:  Forearm pain secondary to a fall. Soft tissue swelling at the proximal forearm. EXAM: RIGHT FOREARM - 2 VIEW COMPARISON:  Radiographs dated 07/07/2019 FINDINGS: Prominent soft tissue  swelling at the elbow consistent with the clinically apparent hematoma. No acute bone abnormality. Old deformity of the distal radius, unchanged since 07/07/2019. IMPRESSION: No acute bone abnormality. Soft tissue swelling at the elbow consistent with the clinically apparent hematoma. Electronically Signed   By: Lorriane Shire M.D.   On: 07/29/2019 12:09   DG Humerus Right  Result Date: 07/29/2019 CLINICAL DATA:  Distal humerus pain secondary to a fall. Soft tissue hematoma at the elbow. EXAM: RIGHT HUMERUS - 2+ VIEW COMPARISON:  Elbow radiographs dated 07/07/2019 FINDINGS: There  is no fracture or dislocation. Arthritic changes of the glenohumeral joint. Soft tissue swelling at the lateral aspect of the elbow. Old healed right rib and clavicle fractures. IMPRESSION: No acute bone abnormality. Arthritic changes of the glenohumeral joint. Soft tissue swelling at the elbow. Electronically Signed   By: Lorriane Shire M.D.   On: 07/29/2019 12:07    Assessment and Plan:     ICD-10-CM   1. Arm pain, right  M79.601 DG Elbow Complete Right    DG Humerus Right    DG Forearm Right  2. Pain in humerus  M89.8X2 DG Humerus Right  3. Pain of right forearm  M79.631 DG Forearm Right  4. Pain and swelling of elbow, right  M25.521 DG Elbow Complete Right   M25.421   5. Denuded skin  R23.8   6. Traumatic hematoma of elbow, right, initial encounter  S50.01XA    Total encounter time: 60 minutes. On the day of the patient encounter, this can include review of prior records, labs, and imaging.  Additional time can include counselling, consultation with peer MD in person or by telephone.  This also includes independent review of Radiology.  Independent review of 3 separate x-rays.  Discussion with the patient, his wife, and one of my partners for follow-up.  Wound care discussion, wrapped with Xeroform gauze, Telfa placed, and loose wrapping with Coban.  Review of record including Eliquis use, additional medical history.  Review of prior office visits, including recent office visit regarding hematoma.  There is no evidence for acute fracture, however the patient has extensive bruising and has a very large hematoma with surface denuding of the skin surrounding this.  There is also traumatic olecranon bursa.  This is been dressed appropriately and will follow up with Mrs. Carlean Purl in 2 days.  Follow-up: No follow-ups on file.  No orders of the defined types were placed in this encounter.  There are no discontinued medications. Orders Placed This Encounter  Procedures  . DG Elbow  Complete Right  . DG Humerus Right  . DG Forearm Right    Signed,  Aitan Rossbach T. Tavaughn Silguero, MD   Outpatient Encounter Medications as of 07/29/2019  Medication Sig  . albuterol (PROVENTIL) (2.5 MG/3ML) 0.083% nebulizer solution Use 1 vial in nebulizer every 4 hours as needed for wheezing or shortness of breath  . ALPRAZolam (XANAX) 0.25 MG tablet Take 1 tablet (0.25 mg total) by mouth 2 (two) times daily as needed for anxiety.  Marland Kitchen atorvastatin (LIPITOR) 80 MG tablet Take 1 tablet (80 mg total) by mouth daily.  . B Complex-C (SUPER B COMPLEX PO) Take 1 tablet by mouth at bedtime.   . cetirizine (ZYRTEC) 10 MG tablet Take 10 mg by mouth at bedtime.   . cholecalciferol (VITAMIN D) 1000 units tablet Take 1,000 Units by mouth daily.  . clindamycin (CLEOCIN) 300 MG capsule Take 300 mg by mouth every 6 (six) hours.  Marland Kitchen ELIQUIS 5 MG TABS  tablet TAKE ONE TABLET BY MOUTH TWICE DAILY   . escitalopram (LEXAPRO) 20 MG tablet Take 20 mg by mouth at bedtime.   . fenofibrate 160 MG tablet TAKE ONE TABLET BY MOUTH DAILY AT BEDTIME   . furosemide (LASIX) 40 MG tablet Take 1 tablet (40 mg total) by mouth daily.  . hydrALAZINE (APRESOLINE) 10 MG tablet TAKE ONE TABLET BY MOUTH THREE TIMES DAILY   . HYDROcodone-acetaminophen (NORCO/VICODIN) 5-325 MG tablet Take 1 tablet by mouth every 4 (four) hours as needed for moderate pain.  Marland Kitchen ipratropium (ATROVENT) 0.03 % nasal spray INSTILL TWO SPRAYS INTO BOTH NOSTRILS AT BEDTIME  . lamoTRIgine (LAMICTAL) 150 MG tablet Take 150 mg by mouth at bedtime. Reported on 08/14/2015  . montelukast (SINGULAIR) 10 MG tablet TAKE ONE TABLET BY MOUTH DAILY AT BEDTIME   . Multiple Vitamin (MULTIVITAMIN) tablet Take 1 tablet by mouth at bedtime.   . pantoprazole (PROTONIX) 40 MG tablet TAKE 1 TABLET BY MOUTH DAILY BEFORE BREAKFAST.  Marland Kitchen STIOLTO RESPIMAT 2.5-2.5 MCG/ACT AERS INHALE TWO PUFFS BY MOUTH INTO THE LUNGS DAILY  . triamcinolone cream (KENALOG) 0.1 % Apply 1 application topically 2  (two) times daily as needed (for skin).   . verapamil (CALAN-SR) 240 MG CR tablet TAKE ONE TABLET BY MOUTH DAILY AT BEDTIME   . nitroGLYCERIN (NITROSTAT) 0.4 MG SL tablet Place 1 tablet (0.4 mg total) under the tongue every 5 (five) minutes as needed for chest pain.   No facility-administered encounter medications on file as of 07/29/2019.

## 2019-07-30 ENCOUNTER — Ambulatory Visit: Payer: PPO

## 2019-07-31 ENCOUNTER — Ambulatory Visit (INDEPENDENT_AMBULATORY_CARE_PROVIDER_SITE_OTHER): Payer: PPO | Admitting: Family Medicine

## 2019-07-31 ENCOUNTER — Other Ambulatory Visit: Payer: Self-pay

## 2019-07-31 ENCOUNTER — Ambulatory Visit: Payer: PPO | Admitting: Family Medicine

## 2019-07-31 ENCOUNTER — Encounter: Payer: Self-pay | Admitting: Family Medicine

## 2019-07-31 VITALS — BP 140/70 | HR 73 | Temp 97.7°F | Ht 61.75 in | Wt 140.0 lb

## 2019-07-31 DIAGNOSIS — S41101D Unspecified open wound of right upper arm, subsequent encounter: Secondary | ICD-10-CM | POA: Diagnosis not present

## 2019-07-31 DIAGNOSIS — R2689 Other abnormalities of gait and mobility: Secondary | ICD-10-CM

## 2019-07-31 NOTE — Assessment & Plan Note (Signed)
Led to recent fall with injury.  Continue rollator. Has completed PT for fall prevention.

## 2019-07-31 NOTE — Progress Notes (Signed)
This visit was conducted in person.  BP 140/70 (BP Location: Left Arm, Patient Position: Sitting, Cuff Size: Normal)   Pulse 73   Temp 97.7 F (36.5 C) (Temporal)   Ht 5' 1.75" (1.568 m)   Wt 140 lb (63.5 kg)   SpO2 94%   BMI 25.81 kg/m    CC: wound check Subjective:    Patient ID: Eduardo [name], male    DOB: 1931-04-22, 86 y.o.   MRN: [id number]  HPI: Eduardo [name] is a 86 y.o. male presenting on 07/31/2019 for Wound Check (Injury to right elbow.  Pt accompanied by wife, Eduardo [name]- temp 97.6. )    DOI: Wed 07/29/2019  Had fall due to ongoing balance issue - large hematoma distal to lateral R elbow with surface denuding of skin proximally.  Wound continues bleeding. He is taking eliquis. Used bleed-stop with benefit.   Saw Dr Eduardo [name] on Wed s/p radiological evaluation without fracture. Wound was dressed - wife has been dressing wound twice daily since then.   No redness. No draining pus. No fevers, nausea.  Was on clindamycin in prep for upcoming tooth procedure - this was stopped when tooth surgery was cancelled.   Wanted wound checked today.  They have called and scheduled wound clinic appt for next Wednesday.     Relevant past medical, surgical, family and social history reviewed and updated as indicated. Interim medical history since our last visit reviewed. Allergies and medications reviewed and updated. Outpatient Medications Prior to Visit  Medication Sig Dispense Refill  . albuterol (PROVENTIL) (2.5 MG/3ML) 0.083% nebulizer solution Use 1 vial in nebulizer every 4 hours as needed for wheezing or shortness of breath 270 mL 2  . ALPRAZolam (XANAX) 0.25 MG tablet Take 1 tablet (0.25 mg total) by mouth 2 (two) times daily as needed for anxiety. 20 tablet 0  . atorvastatin (LIPITOR) 80 MG tablet Take 1 tablet (80 mg total) by mouth daily. 90 tablet 3  . B Complex-C (SUPER B COMPLEX PO) Take 1 tablet by mouth at bedtime.     . cetirizine (ZYRTEC) 10 MG tablet Take  10 mg by mouth at bedtime.     . cholecalciferol (VITAMIN D) 1000 units tablet Take 1,000 Units by mouth daily.    . clindamycin (CLEOCIN) 300 MG capsule Take 300 mg by mouth every 6 (six) hours.    Marland Kitchen ELIQUIS 5 MG TABS tablet TAKE ONE TABLET BY MOUTH TWICE DAILY  180 tablet 0  . escitalopram (LEXAPRO) 20 MG tablet Take 20 mg by mouth at bedtime.     . fenofibrate 160 MG tablet TAKE ONE TABLET BY MOUTH DAILY AT BEDTIME  90 tablet 3  . furosemide (LASIX) 40 MG tablet Take 1 tablet (40 mg total) by mouth daily. 90 tablet 3  . hydrALAZINE (APRESOLINE) 10 MG tablet TAKE ONE TABLET BY MOUTH THREE TIMES DAILY  270 tablet 0  . HYDROcodone-acetaminophen (NORCO/VICODIN) 5-325 MG tablet Take 1 tablet by mouth every 4 (four) hours as needed for moderate pain.    Marland Kitchen ipratropium (ATROVENT) 0.03 % nasal spray INSTILL TWO SPRAYS INTO BOTH NOSTRILS AT BEDTIME 30 mL 0  . lamoTRIgine (LAMICTAL) 150 MG tablet Take 150 mg by mouth at bedtime. Reported on 08/14/2015    . montelukast (SINGULAIR) 10 MG tablet TAKE ONE TABLET BY MOUTH DAILY AT BEDTIME  90 tablet 0  . Multiple Vitamin (MULTIVITAMIN) tablet Take 1 tablet by mouth at bedtime.     . pantoprazole (PROTONIX) 40 MG  tablet TAKE 1 TABLET BY MOUTH DAILY BEFORE BREAKFAST. 90 tablet 3  . STIOLTO RESPIMAT 2.5-2.5 MCG/ACT AERS INHALE TWO PUFFS BY MOUTH INTO THE LUNGS DAILY 12 g 0  . triamcinolone cream (KENALOG) 0.1 % Apply 1 application topically 2 (two) times daily as needed (for skin).     . verapamil (CALAN-SR) 240 MG CR tablet TAKE ONE TABLET BY MOUTH DAILY AT BEDTIME  90 tablet 0  . nitroGLYCERIN (NITROSTAT) 0.4 MG SL tablet Place 1 tablet (0.4 mg total) under the tongue every 5 (five) minutes as needed for chest pain. 90 tablet 3   No facility-administered medications prior to visit.     Per HPI unless specifically indicated in ROS section below Review of Systems Objective:    BP 140/70 (BP Location: Left Arm, Patient Position: Sitting, Cuff Size: Normal)    Pulse 73   Temp 97.7 F (36.5 C) (Temporal)   Ht 5' 1.75" (1.568 m)   Wt 140 lb (63.5 kg)   SpO2 94%   BMI 25.81 kg/m   Wt Readings from Last 3 Encounters:  07/31/19 140 lb (63.5 kg)  07/29/19 139 lb 4 oz (63.2 kg)  07/07/19 145 lb 5 oz (65.9 kg)    Physical Exam Vitals and nursing note reviewed.  Constitutional:      Appearance: Normal appearance. He is not ill-appearing.  Musculoskeletal:        General: Normal range of motion.     Comments: FROM bilateral elbows  Skin:    General: Skin is warm and dry.     Findings: Ecchymosis, lesion and wound present.          Comments: Hematoma to lateral elbow with denuded skin proximally. Wound area is slowly decreasing in size. Persistent surrounding ecchymosis without surrounding erythema or drainage. Slight oozing of medial wound edge. Dry vaseline removed from wound, wound dressed and wrapped as per previously, home instructions provided - continue vaseline gauze dressing change twice daily.   Neurological:     Mental Status: He is alert.  Psychiatric:        Mood and Affect: Mood normal.        Behavior: Behavior normal.         Assessment & Plan:  This visit occurred during the SARS-CoV-2 public health emergency.  Safety protocols were in place, including screening questions prior to the visit, additional usage of staff PPE, and extensive cleaning of exam room while observing appropriate contact time as indicated for disinfecting solutions.   Problem List Items Addressed This Visit    Open arm wound, right, subsequent encounter - Primary    Wound debridement performed today. Overall improvement noted, reassurance provided. Healing by second intention. Continue home wound care as up to now. RTC 3d wound check, they have wound clinic f/u scheduled next week if needed.       Imbalance    Led to recent fall with injury.  Continue rollator. Has completed PT for fall prevention.           No orders of the defined types  were placed in this encounter.  No orders of the defined types were placed in this encounter.   Patient Instructions  Continue the course Return Monday for a wound check Keep appointment with wound clinic Wednesday.    Follow up plan: Return if symptoms worsen or fail to improve.  Ria Bush, MD

## 2019-07-31 NOTE — Telephone Encounter (Signed)
[facility] Night - Client Nonclinical Telephone Record Access[facility]urse Client [facility] - Client Client Site [place] Physician [name] - MD Contact Type Call Who Is Calling Patient / Member / Family / Caregiver Caller Name New Alluwe Phone Number [phone] Patient Name Eduardo [name] Patient DOB January 24, 1931 Call Type Message Only Information Provided Reason for Call Request to Schedule Office Appointment Initial Comment Caller states she needs to make an apt for her husband. Additional Comment Back [phone] - [phone] Disp. Time Disposition Final User 07/31/2019 8:09:01 AM General Information Provided Yes Mariann Laster Call Closed By: Mariann Laster Transaction Date/Time: 07/31/2019 8:05:59 AM (ET)

## 2019-07-31 NOTE — Patient Instructions (Signed)
Continue the course Return Monday for a wound check Keep appointment with wound clinic Wednesday.

## 2019-07-31 NOTE — Telephone Encounter (Signed)
Pt already has appt this morning with Dr Darnell Level at 11:45.

## 2019-07-31 NOTE — Assessment & Plan Note (Addendum)
Wound debridement performed today. Overall improvement noted, reassurance provided. Healing by second intention. Continue home wound care as up to now. RTC 3d wound check, they have wound clinic f/u scheduled next week if needed.

## 2019-08-03 ENCOUNTER — Other Ambulatory Visit: Payer: Self-pay

## 2019-08-03 ENCOUNTER — Ambulatory Visit: Payer: PPO

## 2019-08-03 ENCOUNTER — Ambulatory Visit (INDEPENDENT_AMBULATORY_CARE_PROVIDER_SITE_OTHER)
Admission: RE | Admit: 2019-08-03 | Discharge: 2019-08-03 | Disposition: A | Discharge status: Home / Self Care | Payer: PPO | Source: Ambulatory Visit | Attending: Family Medicine | Admitting: Family Medicine

## 2019-08-03 ENCOUNTER — Ambulatory Visit: Payer: PPO | Admitting: Family Medicine

## 2019-08-03 ENCOUNTER — Encounter: Payer: Self-pay | Admitting: Family Medicine

## 2019-08-03 ENCOUNTER — Ambulatory Visit (INDEPENDENT_AMBULATORY_CARE_PROVIDER_SITE_OTHER): Payer: PPO | Admitting: Family Medicine

## 2019-08-03 VITALS — BP 138/70 | HR 80 | Temp 98.2°F | Ht 61.75 in | Wt 143.3 lb

## 2019-08-03 DIAGNOSIS — W19XXXD Unspecified fall, subsequent encounter: Secondary | ICD-10-CM

## 2019-08-03 DIAGNOSIS — R2689 Other abnormalities of gait and mobility: Secondary | ICD-10-CM | POA: Diagnosis not present

## 2019-08-03 DIAGNOSIS — S41101D Unspecified open wound of right upper arm, subsequent encounter: Secondary | ICD-10-CM

## 2019-08-03 DIAGNOSIS — R0781 Pleurodynia: Secondary | ICD-10-CM

## 2019-08-03 NOTE — Assessment & Plan Note (Signed)
Lungs clear on exam.  No paradoxical ribcage movement with breathing.  Check rib films r/o rib fractures.  Discussed supportive care regardless of results.

## 2019-08-03 NOTE — Patient Instructions (Signed)
Rib xrays today.  Continue wound dressings at home, change to once a day.  Keep wound clinic appointment.  We will ask home health to come out for evaluation.

## 2019-08-03 NOTE — Assessment & Plan Note (Signed)
Worsened leading to fall, despite regular rollator use.  He had recently completed outpatient fall prevention program.  Wife feels overwhelmed with care at home, requests assistance. Discussed temporary HH evaluation then likely personal care private agency. They are interested in further assistance at home. Given recent fall and open wound, I do think patient would benefit from home safety evaluation - will place referral.

## 2019-08-03 NOTE — Progress Notes (Signed)
This visit was conducted in person.  BP 138/70 (BP Location: Left Arm, Patient Position: Sitting, Cuff Size: Normal)   Pulse 80   Temp 98.2 F (36.8 C) (Temporal)   Ht 5' 1.75" (1.568 m)   Wt 143 lb 5 oz (65 kg)   SpO2 96%   BMI 26.42 kg/m    CC: wound check Subjective:    Patient ID: Eduardo [name], male    DOB: 1931/01/29, 86 y.o.   MRN: [id number]  HPI: Eduardo [name] is a 87 y.o. male presenting on 08/03/2019 for Wound Check (Here for f/u of right elbow wound.  Pt accomapanied by wife, [name]- temp 97.9.)   See prior note for details. DOI: 07/29/2019 Saw Dr Lorelei Pont, then myself on 07/31/2019 for wound check.  Upcoming wound clinic appointment on Wednesday.   They request home health eval for fall prevention.   Also notes worsening R anterior ribcage pain since fall - does endorse hitting his rib during fall.      Relevant past medical, surgical, family and social history reviewed and updated as indicated. Interim medical history since our last visit reviewed. Allergies and medications reviewed and updated. Outpatient Medications Prior to Visit  Medication Sig Dispense Refill  . albuterol (PROVENTIL) (2.5 MG/3ML) 0.083% nebulizer solution Use 1 vial in nebulizer every 4 hours as needed for wheezing or shortness of breath 270 mL 2  . ALPRAZolam (XANAX) 0.25 MG tablet Take 1 tablet (0.25 mg total) by mouth 2 (two) times daily as needed for anxiety. 20 tablet 0  . atorvastatin (LIPITOR) 80 MG tablet Take 1 tablet (80 mg total) by mouth daily. 90 tablet 3  . B Complex-C (SUPER B COMPLEX PO) Take 1 tablet by mouth at bedtime.     . cetirizine (ZYRTEC) 10 MG tablet Take 10 mg by mouth at bedtime.     . cholecalciferol (VITAMIN D) 1000 units tablet Take 1,000 Units by mouth daily.    . clindamycin (CLEOCIN) 300 MG capsule Take 300 mg by mouth every 6 (six) hours.    Marland Kitchen ELIQUIS 5 MG TABS tablet TAKE ONE TABLET BY MOUTH TWICE DAILY  180 tablet 0  . escitalopram (LEXAPRO) 20  MG tablet Take 20 mg by mouth at bedtime.     . fenofibrate 160 MG tablet TAKE ONE TABLET BY MOUTH DAILY AT BEDTIME  90 tablet 3  . furosemide (LASIX) 40 MG tablet Take 1 tablet (40 mg total) by mouth daily. 90 tablet 3  . hydrALAZINE (APRESOLINE) 10 MG tablet TAKE ONE TABLET BY MOUTH THREE TIMES DAILY  270 tablet 0  . HYDROcodone-acetaminophen (NORCO/VICODIN) 5-325 MG tablet Take 1 tablet by mouth every 4 (four) hours as needed for moderate pain.    Marland Kitchen ipratropium (ATROVENT) 0.03 % nasal spray INSTILL TWO SPRAYS INTO BOTH NOSTRILS AT BEDTIME 30 mL 0  . lamoTRIgine (LAMICTAL) 150 MG tablet Take 150 mg by mouth at bedtime. Reported on 08/14/2015    . montelukast (SINGULAIR) 10 MG tablet TAKE ONE TABLET BY MOUTH DAILY AT BEDTIME  90 tablet 0  . Multiple Vitamin (MULTIVITAMIN) tablet Take 1 tablet by mouth at bedtime.     . pantoprazole (PROTONIX) 40 MG tablet TAKE 1 TABLET BY MOUTH DAILY BEFORE BREAKFAST. 90 tablet 3  . STIOLTO RESPIMAT 2.5-2.5 MCG/ACT AERS INHALE TWO PUFFS BY MOUTH INTO THE LUNGS DAILY 12 g 0  . triamcinolone cream (KENALOG) 0.1 % Apply 1 application topically 2 (two) times daily as needed (for skin).     Marland Kitchen  verapamil (CALAN-SR) 240 MG CR tablet TAKE ONE TABLET BY MOUTH DAILY AT BEDTIME  90 tablet 0  . nitroGLYCERIN (NITROSTAT) 0.4 MG SL tablet Place 1 tablet (0.4 mg total) under the tongue every 5 (five) minutes as needed for chest pain. 90 tablet 3   No facility-administered medications prior to visit.     Per HPI unless specifically indicated in ROS section below Review of Systems Objective:    BP 138/70 (BP Location: Left Arm, Patient Position: Sitting, Cuff Size: Normal)   Pulse 80   Temp 98.2 F (36.8 C) (Temporal)   Ht 5' 1.75" (1.568 m)   Wt 143 lb 5 oz (65 kg)   SpO2 96%   BMI 26.42 kg/m   Wt Readings from Last 3 Encounters:  08/03/19 143 lb 5 oz (65 kg)  07/31/19 140 lb (63.5 kg)  07/29/19 139 lb 4 oz (63.2 kg)    Physical Exam Vitals and nursing note  reviewed.  Constitutional:      Appearance: Normal appearance. He is not ill-appearing.  Pulmonary:     Effort: Pulmonary effort is normal. No respiratory distress.     Breath sounds: Normal breath sounds. No wheezing, rhonchi or rales.  Chest:     Chest wall: Tenderness (point tenderness anterior ribcage ~5-7) present.    Musculoskeletal:        General: Normal range of motion.  Skin:    General: Skin is warm and dry.     Findings: Bruising, ecchymosis and wound present.          Comments: Decreasing size hematoma to lateral forearm distal to elbow with denuded skin proximally. Improving surrounding ecchymosis with progression of ecchymosis proximally to inner upper arm medial elbow. No surrounding erythema.   Neurological:     Mental Status: He is alert.  Psychiatric:        Mood and Affect: Mood normal.        Behavior: Behavior normal.         Wound dressed and wrapped, home instructions provided - continue vaseline gauze dressing change once daily.  Assessment & Plan:  This visit occurred during the SARS-CoV-2 public health emergency.  Safety protocols were in place, including screening questions prior to the visit, additional usage of staff PPE, and extensive cleaning of exam room while observing appropriate contact time as indicated for disinfecting solutions.  Problem List Items Addressed This Visit    Rib pain on right side - Primary    Lungs clear on exam.  No paradoxical ribcage movement with breathing.  Check rib films r/o rib fractures.  Discussed supportive care regardless of results.       Relevant Orders   DG Ribs Unilateral Right   Open arm wound, right, subsequent encounter    Not much progress over the weekend, no signs of wound infection. rec decrease wound changes to once daily, continue vaseline gauze dressing. They have set up wound clinic appointment for Wednesday - encouraged they keep this appt.       Relevant Orders   Ambulatory referral to  [facility]    Worsened leading to fall, despite regular rollator use.  He had recently completed outpatient fall prevention program.  Wife feels overwhelmed with care at home, requests assistance. Discussed temporary HH evaluation then likely personal care private agency. They are interested in further assistance at home. Given recent fall and open wound, I do think patient would benefit from home safety evaluation - will place referral.  Relevant Orders   Ambulatory referral to [facility]    Other Visit Diagnoses    Injury due to fall, subsequent encounter       Relevant Orders   Ambulatory referral to [facility]       No orders of the defined types were placed in this encounter.  Orders Placed This Encounter  Procedures  . DG Ribs Unilateral Right    Standing Status:   Future    Number of Occurrences:   1    Standing Expiration Date:   09/30/2020    Order Specific Question:   Reason for Exam (SYMPTOM  OR DIAGNOSIS REQUIRED)    Answer:   R anteriolateral rib cage pain    Order Specific Question:   Preferred imaging location?    Answer:   [facility]    Order Specific Question:   Radiology Contrast Protocol - do NOT remove file path    Answer:   [email or web address]  . Ambulatory referral to Home Health    Referral Priority:   Routine    Referral Type:   Home Health Care    Referral Reason:   Specialty Services Required    Requested Specialty:   [facility]    Number of Visits Requested:   1    Patient Instructions  Rib xrays today.  Continue wound dressings at home, change to once a day.  Keep wound clinic appointment.  We will ask home health to come out for evaluation.    Follow up plan: No follow-ups on file.  Ria Bush, MD

## 2019-08-03 NOTE — Assessment & Plan Note (Signed)
Not much progress over the weekend, no signs of wound infection. rec decrease wound changes to once daily, continue vaseline gauze dressing. They have set up wound clinic appointment for Wednesday - encouraged they keep this appt.

## 2019-08-04 ENCOUNTER — Encounter: Payer: Self-pay | Admitting: Family Medicine

## 2019-08-05 ENCOUNTER — Other Ambulatory Visit: Payer: Self-pay

## 2019-08-05 ENCOUNTER — Telehealth: Payer: Self-pay | Admitting: Cardiovascular Disease

## 2019-08-05 ENCOUNTER — Encounter: Payer: PPO | Admitting: Internal Medicine

## 2019-08-05 ENCOUNTER — Encounter: Payer: PPO | Admitting: *Deleted

## 2019-08-05 DIAGNOSIS — S51011A Laceration without foreign body of right elbow, initial encounter: Secondary | ICD-10-CM | POA: Diagnosis not present

## 2019-08-05 DIAGNOSIS — S81801A Unspecified open wound, right lower leg, initial encounter: Secondary | ICD-10-CM | POA: Diagnosis not present

## 2019-08-05 DIAGNOSIS — I4891 Unspecified atrial fibrillation: Secondary | ICD-10-CM | POA: Diagnosis not present

## 2019-08-05 DIAGNOSIS — I5032 Chronic diastolic (congestive) heart failure: Secondary | ICD-10-CM

## 2019-08-05 DIAGNOSIS — I5030 Unspecified diastolic (congestive) heart failure: Secondary | ICD-10-CM | POA: Diagnosis not present

## 2019-08-05 NOTE — Telephone Encounter (Signed)
Patients wife calling in with concern. Patient fell a week ago (3rd fall in a month). Patient has large hematoma, 3 cracked ribs and open wound that is still bleeding on arm. Patient wife is very concerned and wants to speak with someone as soon as possible for advise. Patients wife also concerned about eliquis and whether to continue it or not.  Patient is scheduled at next available with Ignacia Bayley on 3/3 (only would like to see [name] or Rockey Situ due to the history)  Please advise

## 2019-08-05 NOTE — Progress Notes (Signed)
Cardiac Individual Treatment Plan  Patient Details  Name: Eduardo [name] MRN: [id number] Date of Birth: 1930-07-14 Referring Provider:     Pulmonary Rehab from 03/31/2019 in [facility] Cardiac and Pulmonary Rehab  Referring Provider  [place]      Initial Encounter Date:    Pulmonary Rehab from 03/31/2019 in [facility] Cardiac and Pulmonary Rehab  Date  03/31/19      Visit Diagnosis: Heart failure, diastolic, chronic ([place])  Patient's Home Medications on Admission:  Current Outpatient Medications:  .  albuterol (PROVENTIL) (2.5 MG/3ML) 0.083% nebulizer solution, Use 1 vial in nebulizer every 4 hours as needed for wheezing or shortness of breath, Disp: 270 mL, Rfl: 2 .  ALPRAZolam (XANAX) 0.25 MG tablet, Take 1 tablet (0.25 mg total) by mouth 2 (two) times daily as needed for anxiety., Disp: 20 tablet, Rfl: 0 .  atorvastatin (LIPITOR) 80 MG tablet, Take 1 tablet (80 mg total) by mouth daily., Disp: 90 tablet, Rfl: 3 .  B Complex-C (SUPER B COMPLEX PO), Take 1 tablet by mouth at bedtime. , Disp: , Rfl:  .  cetirizine (ZYRTEC) 10 MG tablet, Take 10 mg by mouth at bedtime. , Disp: , Rfl:  .  cholecalciferol (VITAMIN D) 1000 units tablet, Take 1,000 Units by mouth daily., Disp: , Rfl:  .  clindamycin (CLEOCIN) 300 MG capsule, Take 300 mg by mouth every 6 (six) hours., Disp: , Rfl:  .  ELIQUIS 5 MG TABS tablet, TAKE ONE TABLET BY MOUTH TWICE DAILY , Disp: 180 tablet, Rfl: 0 .  escitalopram (LEXAPRO) 20 MG tablet, Take 20 mg by mouth at bedtime. , Disp: , Rfl:  .  fenofibrate 160 MG tablet, TAKE ONE TABLET BY MOUTH DAILY AT BEDTIME , Disp: 90 tablet, Rfl: 3 .  furosemide (LASIX) 40 MG tablet, Take 1 tablet (40 mg total) by mouth daily., Disp: 90 tablet, Rfl: 3 .  hydrALAZINE (APRESOLINE) 10 MG tablet, TAKE ONE TABLET BY MOUTH THREE TIMES DAILY , Disp: 270 tablet, Rfl: 0 .  HYDROcodone-acetaminophen (NORCO/VICODIN) 5-325 MG tablet, Take 1 tablet by mouth every 4 (four) hours as needed for moderate  pain., Disp: , Rfl:  .  ipratropium (ATROVENT) 0.03 % nasal spray, INSTILL TWO SPRAYS INTO BOTH NOSTRILS AT BEDTIME, Disp: 30 mL, Rfl: 0 .  lamoTRIgine (LAMICTAL) 150 MG tablet, Take 150 mg by mouth at bedtime. Reported on 08/14/2015, Disp: , Rfl:  .  montelukast (SINGULAIR) 10 MG tablet, TAKE ONE TABLET BY MOUTH DAILY AT BEDTIME , Disp: 90 tablet, Rfl: 0 .  Multiple Vitamin (MULTIVITAMIN) tablet, Take 1 tablet by mouth at bedtime. , Disp: , Rfl:  .  nitroGLYCERIN (NITROSTAT) 0.4 MG SL tablet, Place 1 tablet (0.4 mg total) under the tongue every 5 (five) minutes as needed for chest pain., Disp: 90 tablet, Rfl: 3 .  pantoprazole (PROTONIX) 40 MG tablet, TAKE 1 TABLET BY MOUTH DAILY BEFORE BREAKFAST., Disp: 90 tablet, Rfl: 3 .  STIOLTO RESPIMAT 2.5-2.5 MCG/ACT AERS, INHALE TWO PUFFS BY MOUTH INTO THE LUNGS DAILY, Disp: 12 g, Rfl: 0 .  triamcinolone cream (KENALOG) 0.1 %, Apply 1 application topically 2 (two) times daily as needed (for skin). , Disp: , Rfl:  .  verapamil (CALAN-SR) 240 MG CR tablet, TAKE ONE TABLET BY MOUTH DAILY AT BEDTIME , Disp: 90 tablet, Rfl: 0  Past Medical History: Past Medical History:  Diagnosis Date  . Allergy   . Anemia 2012, 08/2015   chronic. Acute due to large hematoma 2013. 2017: due to  gastric AVM bleeding.   . Anxiety   . CAD (coronary artery disease)    a. 06/2010 Cath: LM nl, LAD 50p/m, LCX 59m RCA 50p/m -->catheter dissection but good flow, 70d, RPDA 50-70;  b. 06/2010 Relook cath due to bradycardia and inf ST elev: RCA dissection @ ostium extending into coronary cusp and prox RCA, Ao dissection-->less staining than previously-->Med Rx; c. 08/2018 MV: No ischemia.  . Chronic combined systolic (congestive) and diastolic (congestive) heart failure ([place]    a. 12/2014 Echo: EF 60-65%; b. 07/2016 Echo: EF 45-50%, mild LVH. Mild AS/MS. Mod-sev MR. Mild to mod TR. PASP 574mg; c. 08/2018 Echo: EF 55-60%, RVSP 68.10m10m. Sev dil LA, mod dil RA. at least mod MR, mild to mod TR.  Mild AS.  . CMarland KitchenPD (chronic obstructive pulmonary disease) ([id number]   exertional dyspnea  . Gastric angiodysplasia with hemorrhage 08/2015  . Hx of colonic polyps 2001, 2011   adenomatous 2001. HP 2001, 2011.  . HMarland Kitchenperlipidemia   . Mitral regurgitation    a. 07/2016 Echo: Mod to sev MR; b. 08/2018 Echo: at least moderate MR.  . Myocardial infarction ([place]CC[place]/2012.  12/2011.   "twice; 1 day apart; during/after cath". NQMI in setting severe anemia 2013.   . NMarland Kitchenuropathy 2014   in feet, likely due to spinal stenosis, DDD, HNP  . Osteoarthritis 2002   spinal stenosis, spondylolisthesis, disc protrusion multilevel in lumbar spine  . Permanent atrial fibrillation ([id number]   a.  CHA2DS2VASc = 6-->eliquis;  b. 12/2015 Echo: EF 60-65%, no rwma, LVH, mild AS/MS/MR, sev dil LA, mild TR, PASP 42m11m  . Stroke (HCC)[place]  . Upper GI bleed 08/2015   EGD: 2 small gastric AVMs, 1 actively bleeding.  Both treated with APC ablation, clipping of the bleeder    Tobacco Use: Social History   Tobacco Use  Smoking Status Former Smoker  . Packs/day: 2.50  . Years: 30.00  . Pack years: 75.00  . Types: Cigarettes  . Quit date: 06/11/1978  . Years since quitting: 41.1  Smokeless Tobacco Never Used    Labs: Recent ReviChemical engineerLabs for [facility] Cardiac and Pulmonary Rehab Latest Ref Rng & Units 05/18/2016 07/13/2016 07/14/2016 07/18/2016 06/24/2017   Cholestrol 0 - 200 mg/dL 134 - - - 132   LDLCALC 0 - 99 mg/dL - - - - -   LDLDIRECT mg/dL 65.0 - - - 69.0   HDL >39.00 mg/dL 42.80 - - - 44.80   Trlycerides 0.0 - 149.0 mg/dL 952.0 Triglyceride is over 400; calculations on Lipids are invalid.(H) - - - 1031.0 Triglyceride is over 400; calculations on Lipids are invalid.(H)   Hemoglobin A1c 4.8 - 5.6 % - - - 5.4 -   PHART 7.350 - 7.450 - - 7.403 - -   PCO2ART 32.0 - 48.0 mmHg - - 33.5 - -   HCO3 20.0 - 28.0 mmol/L - - 20.9 - -   TCO2 0 - 100 mmol/L - 24 22 - -   ACIDBASEDEF 0.0 - 2.0 mmol/L - - 3.0(H) - -    O2SAT % - - 98.0 - -       Exercise Target Goals: Exercise Program Goal: Individual exercise prescription set using results from initial 6 min walk test and THRR while considering  patient's activity barriers and safety.   Exercise Prescription Goal: Initial exercise prescription builds to 30-45 minutes a day of aerobic activity, 2-3 days per week.  Home exercise guidelines will be  given to patient during program as part of exercise prescription that the participant will acknowledge.  Activity Barriers & Risk Stratification:   6 Minute Walk: 6 Minute Walk    Row Name 03/31/19 1354         6 Minute Walk   Phase  Initial     Distance  400 feet     Walk Time  4.35 minutes     # of Rest Breaks  1     MPH  0.75     METS  0.16     RPE  11     Perceived Dyspnea   2     VO2 Peak  0.57     Symptoms  No     Resting HR  66 bpm     Resting BP  132/64     Resting Oxygen Saturation   97 %     Exercise Oxygen Saturation  during 6 min walk  91 %     Max Ex. HR  87 bpm     Max Ex. BP  150/60     2 Minute Post BP  118/60       Interval HR   1 Minute HR  70     2 Minute HR  87     4 Minute HR  81     5 Minute HR  81     6 Minute HR  80     2 Minute Post HR  72     Interval Heart Rate?  Yes       Interval Oxygen   Interval Oxygen?  Yes     Baseline Oxygen Saturation %  97 %     1 Minute Oxygen Saturation %  90 %     1 Minute Liters of Oxygen  0 L     2 Minute Oxygen Saturation %  90 %     2 Minute Liters of Oxygen  0 L     3 Minute Liters of Oxygen  0 L     4 Minute Oxygen Saturation %  92 %     4 Minute Liters of Oxygen  0 L     5 Minute Oxygen Saturation %  91 %     5 Minute Liters of Oxygen  0 L     6 Minute Oxygen Saturation %  94 %     6 Minute Liters of Oxygen  0 L     2 Minute Post Oxygen Saturation %  94 %     2 Minute Post Liters of Oxygen  0 L        Oxygen Initial Assessment: Oxygen Initial Assessment - 03/26/19 1639      Home Oxygen   Home Oxygen Device   None    Sleep Oxygen Prescription  None    Home Exercise Oxygen Prescription  None    Home at Rest Exercise Oxygen Prescription  None      Initial 6 min Walk   Oxygen Used  None      Program Oxygen Prescription   Program Oxygen Prescription  None      Intervention   Short Term Goals  To learn and understand importance of monitoring SPO2 with pulse oximeter and demonstrate accurate use of the pulse oximeter.;To learn and understand importance of maintaining oxygen saturations>88%;To learn and demonstrate proper pursed lip breathing techniques or other breathing techniques.;To learn and demonstrate proper use of respiratory medications  Long  Term Goals  Verbalizes importance of monitoring SPO2 with pulse oximeter and return demonstration;Maintenance of O2 saturations>88%;Exhibits proper breathing techniques, such as pursed lip breathing or other method taught during program session;Compliance with respiratory medication;Demonstrates proper use of MDI's       Oxygen Re-Evaluation: Oxygen Re-Evaluation    Row Name 04/21/19 0858 06/24/19 0826 07/08/19 0819         Program Oxygen Prescription   Program Oxygen Prescription  None  None  None       Home Oxygen   Home Oxygen Device  None  Home Concentrator  Home Concentrator     Sleep Oxygen Prescription  None  Continuous  Continuous     Liters per minute  --  2  2     Home Exercise Oxygen Prescription  None  None  None     Home at Rest Exercise Oxygen Prescription  None  None  None     Compliance with Home Oxygen Use  --  --  Yes       Goals/Expected Outcomes   Short Term Goals  To learn and demonstrate proper use of respiratory medications;To learn and understand importance of maintaining oxygen saturations>88%;To learn and demonstrate proper pursed lip breathing techniques or other breathing techniques.;To learn and understand importance of monitoring SPO2 with pulse oximeter and demonstrate accurate use of the pulse oximeter.  To  learn and understand importance of monitoring SPO2 with pulse oximeter and demonstrate accurate use of the pulse oximeter.;To learn and exhibit compliance with exercise, home and travel O2 prescription;To learn and understand importance of maintaining oxygen saturations>88%;To learn and demonstrate proper pursed lip breathing techniques or other breathing techniques.;To learn and demonstrate proper use of respiratory medications  To learn and understand importance of monitoring SPO2 with pulse oximeter and demonstrate accurate use of the pulse oximeter.;To learn and exhibit compliance with exercise, home and travel O2 prescription;To learn and understand importance of maintaining oxygen saturations>88%;To learn and demonstrate proper pursed lip breathing techniques or other breathing techniques.;To learn and demonstrate proper use of respiratory medications     Long  Term Goals  Verbalizes importance of monitoring SPO2 with pulse oximeter and return demonstration;Maintenance of O2 saturations>88%;Exhibits proper breathing techniques, such as pursed lip breathing or other method taught during program session;Compliance with respiratory medication;Demonstrates proper use of MDI's  Verbalizes importance of monitoring SPO2 with pulse oximeter and return demonstration;Maintenance of O2 saturations>88%;Exhibits proper breathing techniques, such as pursed lip breathing or other method taught during program session;Compliance with respiratory medication;Demonstrates proper use of MDI's;Exhibits compliance with exercise, home and travel O2 prescription  Verbalizes importance of monitoring SPO2 with pulse oximeter and return demonstration;Maintenance of O2 saturations>88%;Exhibits proper breathing techniques, such as pursed lip breathing or other method taught during program session;Compliance with respiratory medication;Demonstrates proper use of MDI's;Exhibits compliance with exercise, home and travel O2 prescription      Comments  Eduardo [name] has been doing well in Rehab and has kept his oxygen level up while exercising. He was 92 percent today while on the NuStep. He has a pulse oximeter at home to monitor his oxygen and heart rate. He is taking his breathing medications and does not have any questions about them. Eduardo [name] has been using PLB techniques in the gym and at home when needed. Informed him that he needs to be 88 percent and above when exercising.  Dr. Haroldine Laws suggested medical management given his age and encouraged home oxygen for night time to help.  Due to his  heart failure and COPD, he has developed pulmonary hypertension and a leaky mitral valve.  Overall, he feels well and is maintaining his weight.  Breathing is going well and no complaints.  He is still using his PLB to help.     Goals/Expected Outcomes  Short:work on PLB more in rehab. Long: become independent with PLB.  Short: Get set up with home oxygen use for nighttime.  Long: Continue to use PLB to manage breathing during day.  Short: Continued compliance.  Long: Continue to manage COPD        Oxygen Discharge (Final Oxygen Re-Evaluation): Oxygen Re-Evaluation - 07/08/19 0819      Program Oxygen Prescription   Program Oxygen Prescription  None      Home Oxygen   Home Oxygen Device  Home Concentrator    Sleep Oxygen Prescription  Continuous    Liters per minute  2    Home Exercise Oxygen Prescription  None    Home at Rest Exercise Oxygen Prescription  None    Compliance with Home Oxygen Use  Yes      Goals/Expected Outcomes   Short Term Goals  To learn and understand importance of monitoring SPO2 with pulse oximeter and demonstrate accurate use of the pulse oximeter.;To learn and exhibit compliance with exercise, home and travel O2 prescription;To learn and understand importance of maintaining oxygen saturations>88%;To learn and demonstrate proper pursed lip breathing techniques or other breathing techniques.;To learn and demonstrate  proper use of respiratory medications    Long  Term Goals  Verbalizes importance of monitoring SPO2 with pulse oximeter and return demonstration;Maintenance of O2 saturations>88%;Exhibits proper breathing techniques, such as pursed lip breathing or other method taught during program session;Compliance with respiratory medication;Demonstrates proper use of MDI's;Exhibits compliance with exercise, home and travel O2 prescription    Comments  Breathing is going well and no complaints.  He is still using his PLB to help.    Goals/Expected Outcomes  Short: Continued compliance.  Long: Continue to manage COPD       Initial Exercise Prescription: Initial Exercise Prescription - 03/31/19 1300      Date of Initial Exercise RX and Referring Provider   Date  03/31/19    Referring Provider  [place]ak      Treadmill   MPH  0.5    Grade  0    Minutes  2    METs  0.5      Recumbant Bike   Level  1    RPM  50    Watts  15    Minutes  10    METs  1      NuStep   Level  1    SPM  80    Minutes  15    METs  1.5      REL-XR   Level  1    Watts  20    Speed  30    Minutes  10    METs  1.5      T5 Nustep   Level  1    SPM  50    Minutes  5    METs  1      Biostep-RELP   Level  1    SPM  50    Minutes  5    METs  1      Prescription Details   Duration  Progress to 30 minutes of continuous aerobic without signs/symptoms of physical distress  Intensity   Ratings of Perceived Exertion  11-15    Perceived Dyspnea  0-4      Progression   Progression  Continue progressive overload as per policy without signs/symptoms or physical distress.      Resistance Training   Training Prescription  Yes    Weight  3    Reps  10-15       Perform Capillary Blood Glucose checks as needed.  Exercise Prescription Changes: Exercise Prescription Changes    Row Name 04/08/19 1300 04/14/19 0900 04/22/19 1200 05/04/19 0900 05/04/19 0932     Response to Exercise   Blood Pressure (Admit)   116/52  --  140/60  132/68  --   Blood Pressure (Exercise)  140/64  --  124/62  130/68  --   Blood Pressure (Exit)  136/74  --  148/60  128/60  --   Heart Rate (Admit)  70 bpm  --  72 bpm  68 bpm  --   Heart Rate (Exercise)  88 bpm  --  92 bpm  89 bpm  --   Heart Rate (Exit)  67 bpm  --  77 bpm  72 bpm  --   Oxygen Saturation (Admit)  93 %  --  94 %  92 %  --   Oxygen Saturation (Exercise)  92 %  --  91 %  90 %  --   Oxygen Saturation (Exit)  95 %  --  93 %  95 %  --   Rating of Perceived Exertion (Exercise)  13  --  11  13  --   Perceived Dyspnea (Exercise)  3  --  2  2  --   Symptoms  SOB  --  SOB  SOB  --   Duration  Continue with 30 min of aerobic exercise without signs/symptoms of physical distress.  --  Continue with 30 min of aerobic exercise without signs/symptoms of physical distress.  Continue with 30 min of aerobic exercise without signs/symptoms of physical distress.  --   Intensity  THRR unchanged  --  THRR unchanged  THRR unchanged  --     Progression   Progression  Continue to progress workloads to maintain intensity without signs/symptoms of physical distress.  --  Continue to progress workloads to maintain intensity without signs/symptoms of physical distress.  Continue to progress workloads to maintain intensity without signs/symptoms of physical distress.  --   Average METs  2.26  --  2.2  2.1  --     Resistance Training   Training Prescription  Yes  --  Yes  Yes  --   Weight  3 lbs  --  3 lb  3 lb  --   Reps  10-15  --  10-15  10-15  --     Interval Training   Interval Training  No  --  No  No  --     Recumbant Bike   Level  1  --  --  --  --   Watts  18  --  --  --  --   Minutes  15  --  --  --  --   METs  2.97  --  --  --  --     NuStep   Level  --  --  3  3  --   Minutes  --  --  30  30  --   METs  --  --  2.5  2.1  --  REL-XR   Level  1  --  --  --  --   Minutes  15  --  --  --  --     T5 Nustep   Level  1  --  1  --  --   Minutes  15  --  15  --   --   METs  1.8  --  2.2  --  --     Biostep-RELP   Level  1  --  --  3  --   Minutes  15  --  --  15  --   METs  2  --  --  2  --     Home Exercise Plan   Plans to continue exercise at  --  Home (comment) looking for a Nustep for home  Home (comment) looking for a Nustep for home  --  Home (comment) looking for a Nustep for home   Frequency  --  Add 1 additional day to program exercise sessions.  Add 1 additional day to program exercise sessions.  --  Add 1 additional day to program exercise sessions.   Initial Home Exercises Provided  --  04/14/19  04/14/19  --  04/14/19   Row Name 05/21/19 1200 06/01/19 1400 06/18/19 1500         Response to Exercise   Blood Pressure (Admit)  136/54  134/60  124/76     Blood Pressure (Exercise)  142/68  142/60  124/60     Blood Pressure (Exit)  --  130/56  134/66     Heart Rate (Admit)  67 bpm  76 bpm  77 bpm     Heart Rate (Exercise)  83 bpm  89 bpm  72 bpm     Heart Rate (Exit)  67 bpm  78 bpm  61 bpm     Oxygen Saturation (Admit)  93 %  97 %  95 %     Oxygen Saturation (Exercise)  93 %  93 %  93 %     Oxygen Saturation (Exit)  92 %  92 %  92 %     Rating of Perceived Exertion (Exercise)  '15  15  12     ' Perceived Dyspnea (Exercise)  '2  1  3     ' Duration  Progress to 30 minutes of  aerobic without signs/symptoms of physical distress  Continue with 30 min of aerobic exercise without signs/symptoms of physical distress.  Continue with 30 min of aerobic exercise without signs/symptoms of physical distress.     Intensity  THRR unchanged  THRR unchanged  THRR unchanged       Progression   Progression  Continue to progress workloads to maintain intensity without signs/symptoms of physical distress.  Continue to progress workloads to maintain intensity without signs/symptoms of physical distress.  Continue to progress workloads to maintain intensity without signs/symptoms of physical distress.     Average METs  2.4  2.17  2.8       Resistance Training    Training Prescription  Yes  Yes  Yes     Weight  5 lb  5 lb  5 lb     Reps  10-15  10-15  10-15       Interval Training   Interval Training  No  No  No       NuStep   Level  '3  3  4     ' Minutes  --  15  15     METs  --  2.6  2       T5 Nustep   Level  4  4  --     SPM  80  --  --     Minutes  15  15  --     METs  1.9  1.9  --       Biostep-RELP   Level  --  2  --     Minutes  --  15  --     METs  --  2  --       Home Exercise Plan   Plans to continue exercise at  Home (comment) looking for a Nustep for home  Home (comment) looking for a Nustep for home  Home (comment) looking for a Nustep for home     Frequency  Add 1 additional day to program exercise sessions.  Add 1 additional day to program exercise sessions.  Add 1 additional day to program exercise sessions.     Initial Home Exercises Provided  04/14/19  04/14/19  04/14/19        Exercise Comments: Exercise Comments    Row Name 04/02/19 0902           Exercise Comments  First full day of exercise!  Patient was oriented to gym and equipment including functions, settings, policies, and procedures.  Patient's individual exercise prescription and treatment plan were reviewed.  All starting workloads were established based on the results of the 6 minute walk test done at initial orientation visit.  The plan for exercise progression was also introduced and progression will be customized based on patient's performance and goals.          Exercise Goals and Review: Exercise Goals    Row Name 03/26/19 1639 03/31/19 1400           Exercise Goals   Increase Physical Activity  Yes  Yes      Intervention  Provide advice, education, support and counseling about physical activity/exercise needs.;Develop an individualized exercise prescription for aerobic and resistive training based on initial evaluation findings, risk stratification, comorbidities and participant's personal goals.  Provide advice, education, support and  counseling about physical activity/exercise needs.;Develop an individualized exercise prescription for aerobic and resistive training based on initial evaluation findings, risk stratification, comorbidities and participant's personal goals.      Expected Outcomes  Short Term: Attend rehab on a regular basis to increase amount of physical activity.;Long Term: Add in home exercise to make exercise part of routine and to increase amount of physical activity.;Long Term: Exercising regularly at least 3-5 days a week.  Short Term: Attend rehab on a regular basis to increase amount of physical activity.;Long Term: Add in home exercise to make exercise part of routine and to increase amount of physical activity.;Long Term: Exercising regularly at least 3-5 days a week.      Increase Strength and Stamina  Yes  --      Intervention  Provide advice, education, support and counseling about physical activity/exercise needs.;Develop an individualized exercise prescription for aerobic and resistive training based on initial evaluation findings, risk stratification, comorbidities and participant's personal goals.  Provide advice, education, support and counseling about physical activity/exercise needs.;Develop an individualized exercise prescription for aerobic and resistive training based on initial evaluation findings, risk stratification, comorbidities and participant's personal goals.      Expected Outcomes  Short Term: Increase workloads from initial exercise prescription for resistance, speed, and  METs.;Short Term: Perform resistance training exercises routinely during rehab and add in resistance training at home;Long Term: Improve cardiorespiratory fitness, muscular endurance and strength as measured by increased METs and functional capacity (6MWT)  Short Term: Increase workloads from initial exercise prescription for resistance, speed, and METs.;Short Term: Perform resistance training exercises routinely during rehab  and add in resistance training at home;Long Term: Improve cardiorespiratory fitness, muscular endurance and strength as measured by increased METs and functional capacity (6MWT)      Able to understand and use rate of perceived exertion (RPE) scale  Yes  Yes      Intervention  Provide education and explanation on how to use RPE scale  Provide education and explanation on how to use RPE scale      Expected Outcomes  Short Term: Able to use RPE daily in rehab to express subjective intensity level;Long Term:  Able to use RPE to guide intensity level when exercising independently  Short Term: Able to use RPE daily in rehab to express subjective intensity level;Long Term:  Able to use RPE to guide intensity level when exercising independently      Able to understand and use Dyspnea scale  Yes  Yes      Intervention  Provide education and explanation on how to use Dyspnea scale  Provide education and explanation on how to use Dyspnea scale      Expected Outcomes  Short Term: Able to use Dyspnea scale daily in rehab to express subjective sense of shortness of breath during exertion;Long Term: Able to use Dyspnea scale to guide intensity level when exercising independently  Short Term: Able to use Dyspnea scale daily in rehab to express subjective sense of shortness of breath during exertion;Long Term: Able to use Dyspnea scale to guide intensity level when exercising independently      Knowledge and understanding of Target Heart Rate Range (THRR)  Yes  Yes      Intervention  Provide education and explanation of THRR including how the numbers were predicted and where they are located for reference  Provide education and explanation of THRR including how the numbers were predicted and where they are located for reference      Expected Outcomes  Short Term: Able to state/look up THRR;Short Term: Able to use daily as guideline for intensity in rehab;Long Term: Able to use THRR to govern intensity when exercising  independently  Short Term: Able to state/look up THRR;Short Term: Able to use daily as guideline for intensity in rehab;Long Term: Able to use THRR to govern intensity when exercising independently      Able to check pulse independently  Yes  Yes      Intervention  Provide education and demonstration on how to check pulse in carotid and radial arteries.;Review the importance of being able to check your own pulse for safety during independent exercise  Provide education and demonstration on how to check pulse in carotid and radial arteries.;Review the importance of being able to check your own pulse for safety during independent exercise      Expected Outcomes  Short Term: Able to explain why pulse checking is important during independent exercise;Long Term: Able to check pulse independently and accurately  Short Term: Able to explain why pulse checking is important during independent exercise;Long Term: Able to check pulse independently and accurately      Understanding of Exercise Prescription  Yes  Yes      Intervention  Provide education, explanation, and written materials on  patient's individual exercise prescription  Provide education, explanation, and written materials on patient's individual exercise prescription      Expected Outcomes  Short Term: Able to explain program exercise prescription;Long Term: Able to explain home exercise prescription to exercise independently  Short Term: Able to explain program exercise prescription;Long Term: Able to explain home exercise prescription to exercise independently         Exercise Goals Re-Evaluation : Exercise Goals Re-Evaluation    Row Name 04/02/19 0902 04/08/19 1326 04/14/19 0942 04/21/19 0854 05/04/19 0925     Exercise Goal Re-Evaluation   Exercise Goals Review  Able to understand and use rate of perceived exertion (RPE) scale;Knowledge and understanding of Target Heart Rate Range (THRR);Understanding of Exercise Prescription  Increase Physical  Activity;Increase Strength and Stamina;Understanding of Exercise Prescription  Increase Physical Activity;Increase Strength and Stamina;Able to understand and use rate of perceived exertion (RPE) scale;Knowledge and understanding of Target Heart Rate Range (THRR);Able to check pulse independently;Understanding of Exercise Prescription  Increase Physical Activity;Increase Strength and Stamina  Increase Physical Activity;Increase Strength and Stamina;Understanding of Exercise Prescription   Comments  Reviewed RPE scale, THR and program prescription with pt today.  Pt voiced understanding and was given a copy of goals to take home.  Eduardo [name] is off to a good start.  He has completed his first three days of exercise. He was able to get 2 METs on the BioStep. We will continue to monitor his progress.  Reviewed home exercise with pt today.  Pt plans to get a nustep for home for exercise.  Reviewed THR, pulse, RPE, sign and symptoms, NTG use, and when to call 911 or MD.  Also discussed weather considerations and indoor options.  Pt voiced understanding.  Eduardo [name] has been working out in the [employer] regularly. He is still trying to find a new step for his home. He has been taking advantage of the programs that the [facility] offers. He is trying to exercise as much as he can without overdoing it. He has been enjoying exercise in the program and is doing well. Eduardo [name] wants to have enough energy to be able to walk up a full flight of stairs.  Eduardo [name] has been doing well in rehab.  He is on level 3 on the BioStep now!  We will continue to monitor his progress.   Expected Outcomes  hort: Use RPE daily to regulate intensity. Long: Follow program prescription in THR.  Short: Continue to attend regularly.  Long: Continue to follow program prescription.  Short - do strength work at home until he can find nustep :Long - maintain exercise on his own  Short: be able to walk further. Long: maintain walking and exercise independently.   Short: Increase workloads.  Long: Continue to increase walking at home.   [place] Name 05/21/19 1248 06/01/19 1430 06/18/19 1503 06/24/19 0825 07/08/19 0817     Exercise Goal Re-Evaluation   Exercise Goals Review  Increase Physical Activity;Increase Strength and Stamina;Able to understand and use rate of perceived exertion (RPE) scale;Able to understand and use Dyspnea scale;Knowledge and understanding of Target Heart Rate Range (THRR);Able to check pulse independently;Understanding of Exercise Prescription  Increase Physical Activity;Increase Strength and Stamina;Understanding of Exercise Prescription  Increase Physical Activity;Increase Strength and Stamina;Able to understand and use rate of perceived exertion (RPE) scale;Able to understand and use Dyspnea scale;Knowledge and understanding of Target Heart Rate Range (THRR);Able to check pulse independently;Understanding of Exercise Prescription  Increase Physical Activity;Increase Strength and Stamina;Understanding of Exercise Prescription  Increase Physical  Activity;Increase Strength and Stamina;Understanding of Exercise Prescription   Comments  Eduardo [name] has moved up to 5 lb for strength work.  He does do strength work at home.  Staff will monitor progress  Eduardo [name] continues to do well in rehab.  He is doing well with his 30 min on the NuStep.  He is on level  3 on most of his pieces.  We will continue to monitor his progress.  Jacque does videos when he cant attend LW sessions.  Staff will monitor progress.  He has been walking some and is looking into purchasing some equipment for home use.  [facility] looked into the [facility], but they only had one stepper that he could use.  Eduardo [name] is doing well at home.  They purchased a Teeter, a recumbent stepper, for him to use at home.  He was very excited about it.  Unfortunately, it has already gotten the best of him as he fell on one of its arms.  He has a large bruise but nothing is broken.  He is using it for 30 min a day  normally.   Expected Outcomes  Short :  continue to attend consistently Long - build stamina overall  Short: Continue to improve stamina.  Long: Continue to exercise independently at home.  Short :  continue to improve stamina Long:  maintain fitness on his own  Short: Find equipmet to use at home.  Long: Continue to exercise at home.  Short: Continue to use Teeter at home.  Long: Continue to work on balance and stamina.   [place] Name 07/22/19 0840 08/05/19 0820           Exercise Goal Re-Evaluation   Exercise Goals Review  Increase Physical Activity;Increase Strength and Stamina;Understanding of Exercise Prescription  --      Comments  He has been using his teeter everyday.  He is enjoying it and feels stronger.  Eduardo [name] has done well in rehab.  He is now using his [employer] (at home stepper) daily.  Unfortunately, he fell at home again and has a huge hematoma on his arm.  He is getting it evaluated and it was recommended that he look into PT for balance.   He does have an appointment with the wound clinic.  He has also gotten the book Falling is not an Option for exercises to improve balance.      Expected Outcomes  Short: Continue to use Teeter at home.  Long: Continue to work on balance and stamina.  Short: Look into PT for balance help  Long: Continue to exericse daily.         Discharge Exercise Prescription (Final Exercise Prescription Changes): Exercise Prescription Changes - 06/18/19 1500      Response to Exercise   Blood Pressure (Admit)  124/76    Blood Pressure (Exercise)  124/60    Blood Pressure (Exit)  134/66    Heart Rate (Admit)  77 bpm    Heart Rate (Exercise)  72 bpm    Heart Rate (Exit)  61 bpm    Oxygen Saturation (Admit)  95 %    Oxygen Saturation (Exercise)  93 %    Oxygen Saturation (Exit)  92 %    Rating of Perceived Exertion (Exercise)  12    Perceived Dyspnea (Exercise)  3    Duration  Continue with 30 min of aerobic exercise without signs/symptoms of physical  distress.    Intensity  THRR unchanged      Progression  Progression  Continue to progress workloads to maintain intensity without signs/symptoms of physical distress.    Average METs  2.8      Resistance Training   Training Prescription  Yes    Weight  5 lb    Reps  10-15      Interval Training   Interval Training  No      NuStep   Level  4    Minutes  15    METs  2      Home Exercise Plan   Plans to continue exercise at  Home (comment)   looking for a Nustep for home   Frequency  Add 1 additional day to program exercise sessions.    Initial Home Exercises Provided  04/14/19       Nutrition:  Target Goals: Understanding of nutrition guidelines, daily intake of sodium <1525m, cholesterol <2077m calories 30% from fat and 7% or less from saturated fats, daily to have 5 or more servings of fruits and vegetables.  Biometrics: Pre Biometrics - 03/31/19 1400      Pre Biometrics   Height  '5\' 2"'  (1.575 m)    Weight  142 lb 1.6 oz (64.5 kg)    BMI (Calculated)  25.98        Nutrition Therapy Plan and Nutrition Goals: Nutrition Therapy & Goals - 04/27/19 1516      Nutrition Therapy   Diet  Low Na, HH diet    Protein (specify units)  55g    Fiber  30 grams    Whole Grain Foods  3 servings    Saturated Fats  12 max. grams    Fruits and Vegetables  5 servings/day    Sodium  1.5 grams      Personal Nutrition Goals   Nutrition Goal  N/A    Comments  RD eval deferred at this time by wife TiOtila KluverWill confirm with pt and continue to check in during re-evaluations. Pt wife reports eating mediterranean diet; lots of fruits and vegetables, fish, lean protein, healthy fats, whole grains. Pt wife reports that he could do better with sodium and water, but that their overall lifestyle is healthy. tips on how to optimize diet for overall health and providing any resources as well as tips and checking in as role of RD.       Nutrition Assessments: Nutrition Assessments - 03/31/19  1410      MEDFICTS Scores   Pre Score  42       Nutrition Goals Re-Evaluation:   Nutrition Goals Discharge (Final Nutrition Goals Re-Evaluation):   Psychosocial: Target Goals: Acknowledge presence or absence of significant depression and/or stress, maximize coping skills, provide positive support system. Participant is able to verbalize types and ability to use techniques and skills needed for reducing stress and depression.   Initial Review & Psychosocial Screening: Initial Psych Review & Screening - 03/26/19 1640      Initial Review   Current issues with  Current Psychotropic Meds;Current Anxiety/Panic      Family Dynamics   Good Support System?  Yes    Comments  He can look to his wife, daughter and son for support. He talks with a therapist about his anxiety.      Barriers   Psychosocial barriers to participate in program  The patient should benefit from training in stress management and relaxation.      Screening Interventions   Interventions  Encouraged to exercise;To provide support and resources with identified psychosocial needs;Provide  feedback about the scores to participant    Expected Outcomes  Short Term goal: Utilizing psychosocial counselor, staff and physician to assist with identification of specific Stressors or current issues interfering with healing process. Setting desired goal for each stressor or current issue identified.;Long Term Goal: Stressors or current issues are controlled or eliminated.;Long Term goal: The participant improves quality of Life and PHQ9 Scores as seen by post scores and/or verbalization of changes;Short Term goal: Identification and review with participant of any Quality of Life or Depression concerns found by scoring the questionnaire.       Quality of Life Scores:   Scores of 19 and below usually indicate a poorer quality of life in these areas.  A difference of  2-3 points is a clinically meaningful difference.  A difference of  2-3 points in the total score of the Quality of Life Index has been associated with significant improvement in overall quality of life, self-image, physical symptoms, and general health in studies assessing change in quality of life.  PHQ-9: Recent Review Flowsheet Data    Depression screen [facility] 2/9 07/02/2019 06/24/2017 05/29/2016   Decreased Interest 0 0 0   Down, Depressed, Hopeless 0 0 0   PHQ - 2 Score 0 0 0     Interpretation of Total Score  Total Score Depression Severity:  1-4 = Minimal depression, 5-9 = Mild depression, 10-14 = Moderate depression, 15-19 = Moderately severe depression, 20-27 = Severe depression   Psychosocial Evaluation and Intervention: Psychosocial Evaluation - 08/05/19 0821      Discharge Psychosocial Assessment & Intervention   Comments  He has been sleeping in the chair as he has not been able to be comfortable in the bed.  Other than the pain from fall, he is doing well.       Psychosocial Re-Evaluation: Psychosocial Re-Evaluation    Row Name 04/21/19 [phone] 05/19/19 0845           Psychosocial Re-Evaluation   Current issues with  Current Depression;Current Psychotropic Meds;Current Anxiety/Panic  --      Comments  He has some anxiety with his health and when he gets short of breath. Eduardo [name] stays positive and wants to live as long as possible. He looks forward having breakfast daily and likes to watch [employer].  Eduardo [name] has no new stress.  He uses having breakfast and netflix to manage stress.      Expected Outcomes  Short: continue to exercise to reduce stress. Long: maintain exercise to keep stress at a minimum.  Short - continue to exercise Long - maintain positive attitude      Interventions  Encouraged to attend Pulmonary Rehabilitation for the exercise  --      Continue Psychosocial Services   Follow up required by staff  --         Psychosocial Discharge (Final Psychosocial Re-Evaluation): Psychosocial Re-Evaluation - 05/19/19 0845       Psychosocial Re-Evaluation   Comments  Eduardo [name] has no new stress.  He uses having breakfast and netflix to manage stress.    Expected Outcomes  Short - continue to exercise Long - maintain positive attitude       Vocational Rehabilitation: Provide vocational rehab assistance to qualifying candidates.   Vocational Rehab Evaluation & Intervention:   Education: Education Goals: Education classes will be provided on a variety of topics geared toward better understanding of heart health and risk factor modification. Participant will state understanding/return demonstration of topics presented as noted by education test  scores.  Learning Barriers/Preferences: Learning Barriers/Preferences - 03/26/19 1642      Learning Barriers/Preferences   Learning Barriers  None    Learning Preferences  None       Education Topics:  AED/CPR: - Group verbal and written instruction with the use of models to demonstrate the basic use of the AED with the basic ABC's of resuscitation.   General Nutrition Guidelines/Fats and Fiber: -Group instruction provided by verbal, written material, models and posters to present the general guidelines for heart healthy nutrition. Gives an explanation and review of dietary fats and fiber.   Cardiac Rehab from 05/14/2019 in [facility] Cardiac and Pulmonary Rehab  Date  04/30/19  Educator  mc  Instruction Review Code  1- Verbalizes Understanding      Controlling Sodium/Reading Food Labels: -Group verbal and written material supporting the discussion of sodium use in heart healthy nutrition. Review and explanation with models, verbal and written materials for utilization of the food label.   Exercise Physiology & General Exercise Guidelines: - Group verbal and written instruction with models to review the exercise physiology of the cardiovascular system and associated critical values. Provides general exercise guidelines with specific guidelines to those with heart or lung  disease.    Aerobic Exercise & Resistance Training: - Gives group verbal and written instruction on the various components of exercise. Focuses on aerobic and resistive training programs and the benefits of this training and how to safely progress through these programs..   Cardiac Rehab from 05/14/2019 in [facility] Cardiac and Pulmonary Rehab  Date  04/16/19  Educator  jh  Instruction Review Code  1- Verbalizes Understanding      Flexibility, Balance, Mind/Body Relaxation: Provides group verbal/written instruction on the benefits of flexibility and balance training, including mind/body exercise modes such as yoga, pilates and tai chi.  Demonstration and skill practice provided.   Cardiac Rehab from 05/14/2019 in [facility] Cardiac and Pulmonary Rehab  Date  05/14/19  Educator  as  Instruction Review Code  1- Verbalizes Understanding      Stress and Anxiety: - Provides group verbal and written instruction about the health risks of elevated stress and causes of high stress.  Discuss the correlation between heart/lung disease and anxiety and treatment options. Review healthy ways to manage with stress and anxiety.   Depression: - Provides group verbal and written instruction on the correlation between heart/lung disease and depressed mood, treatment options, and the stigmas associated with seeking treatment.   Anatomy & Physiology of the Heart: - Group verbal and written instruction and models provide basic cardiac anatomy and physiology, with the coronary electrical and arterial systems. Review of Valvular disease and Heart Failure   Cardiac Procedures: - Group verbal and written instruction to review commonly prescribed medications for heart disease. Reviews the medication, class of the drug, and side effects. Includes the steps to properly store meds and maintain the prescription regimen. (beta blockers and nitrates)   Cardiac Rehab from 05/14/2019 in [facility] Cardiac and Pulmonary Rehab  Date   04/16/19  Educator  mc  Instruction Review Code  1- Verbalizes Understanding      Cardiac Medications I: - Group verbal and written instruction to review commonly prescribed medications for heart disease. Reviews the medication, class of the drug, and side effects. Includes the steps to properly store meds and maintain the prescription regimen.   Cardiac Medications II: -Group verbal and written instruction to review commonly prescribed medications for heart disease. Reviews the medication, class of the  drug, and side effects. (all other drug classes)   Cardiac Rehab from 04/02/2019 in [facility] Cardiac and Pulmonary Rehab  Date  04/02/19  Educator  [facility]  Instruction Review Code  1- Verbalizes Understanding       Go Sex-Intimacy & Heart Disease, Get SMART - Goal Setting: - Group verbal and written instruction through game format to discuss heart disease and the return to sexual intimacy. Provides group verbal and written material to discuss and apply goal setting through the application of the S.M.A.R.T. Method.   Cardiac Rehab from 05/14/2019 in [facility] Cardiac and Pulmonary Rehab  Date  04/16/19  Educator  mc  Instruction Review Code  1- Verbalizes Understanding      Other Matters of the Heart: - Provides group verbal, written materials and models to describe Stable Angina and Peripheral Artery. Includes description of the disease process and treatment options available to the cardiac patient.   Exercise & Equipment Safety: - Individual verbal instruction and demonstration of equipment use and safety with use of the equipment.   Infection Prevention: - Provides verbal and written material to individual with discussion of infection control including proper hand washing and proper equipment cleaning during exercise session.   Pulmonary Rehab from 03/31/2019 in [facility] Cardiac and Pulmonary Rehab  Date  03/31/19  Educator  [place]  Instruction Review Code  1- Verbalizes Understanding       Falls Prevention: - Provides verbal and written material to individual with discussion of falls prevention and safety.   Pulmonary Rehab from 03/31/2019 in [facility] Cardiac and Pulmonary Rehab  Date  03/31/19  Educator  [place]  Instruction Review Code  1- Verbalizes Understanding      Diabetes: - Individual verbal and written instruction to review signs/symptoms of diabetes, desired ranges of glucose level fasting, after meals and with exercise. Acknowledge that pre and post exercise glucose checks will be done for 3 sessions at entry of program.   Know Your Numbers and Risk Factors: -Group verbal and written instruction about important numbers in your health.  Discussion of what are risk factors and how they play a role in the disease process.  Review of Cholesterol, Blood Pressure, Diabetes, and BMI and the role they play in your overall health.   Cardiac Rehab from 04/02/2019 in [facility] Cardiac and Pulmonary Rehab  Date  04/02/19  Educator  [facility]  Instruction Review Code  1- Verbalizes Understanding      Sleep Hygiene: -Provides group verbal and written instruction about how sleep can affect your health.  Define sleep hygiene, discuss sleep cycles and impact of sleep habits. Review good sleep hygiene tips.    Other: -Provides group and verbal instruction on various topics (see comments)   Knowledge Questionnaire Score:   Core Components/Risk Factors/Patient Goals at Admission: Personal Goals and Risk Factors at Admission - 03/26/19 1642      Core Components/Risk Factors/Patient Goals on Admission    Weight Management  Weight Loss;Yes    Intervention  Weight Management: Develop a combined nutrition and exercise program designed to reach desired caloric intake, while maintaining appropriate intake of nutrient and fiber, sodium and fats, and appropriate energy expenditure required for the weight goal.;Weight Management: Provide education and appropriate resources to help participant  work on and attain dietary goals.    Expected Outcomes  Short Term: Continue to assess and modify interventions until short term weight is achieved;Long Term: Adherence to nutrition and physical activity/exercise program aimed toward attainment of established weight goal;Weight Loss: Understanding  of general recommendations for a balanced deficit meal plan, which promotes 1-2 lb weight loss per week and includes a negative energy balance of [phone] kcal/d;Understanding of distribution of calorie intake throughout the day with the consumption of 4-5 meals/snacks    Improve shortness of breath with ADL's  Yes    Intervention  Provide education, individualized exercise plan and daily activity instruction to help decrease symptoms of SOB with activities of daily living.    Expected Outcomes  Short Term: Improve cardiorespiratory fitness to achieve a reduction of symptoms when performing ADLs;Long Term: Be able to perform more ADLs without symptoms or delay the onset of symptoms    Heart Failure  Yes    Intervention  Provide a combined exercise and nutrition program that is supplemented with education, support and counseling about heart failure. Directed toward relieving symptoms such as shortness of breath, decreased exercise tolerance, and extremity edema.    Expected Outcomes  Improve functional capacity of life;Short term: Attendance in program 2-3 days a week with increased exercise capacity. Reported lower sodium intake. Reported increased fruit and vegetable intake. Reports medication compliance.;Short term: Daily weights obtained and reported for increase. Utilizing diuretic protocols set by physician.;Long term: Adoption of self-care skills and reduction of barriers for early signs and symptoms recognition and intervention leading to self-care maintenance.    Hypertension  Yes    Intervention  Provide education on lifestyle modifcations including regular physical activity/exercise, weight management,  moderate sodium restriction and increased consumption of fresh fruit, vegetables, and low fat dairy, alcohol moderation, and smoking cessation.;Monitor prescription use compliance.    Expected Outcomes  Short Term: Continued assessment and intervention until BP is < 140/50m HG in hypertensive participants. < 130/868mHG in hypertensive participants with diabetes, heart failure or chronic kidney disease.;Long Term: Maintenance of blood pressure at goal levels.    Lipids  Yes    Intervention  Provide education and support for participant on nutrition & aerobic/resistive exercise along with prescribed medications to achieve LDL <7026mHDL >25m12m  Expected Outcomes  Short Term: Participant states understanding of desired cholesterol values and is compliant with medications prescribed. Participant is following exercise prescription and nutrition guidelines.;Long Term: Cholesterol controlled with medications as prescribed, with individualized exercise RX and with personalized nutrition plan. Value goals: LDL < 70mg13mL > 40 mg.       Core Components/Risk Factors/Patient Goals Review:  Goals and Risk Factor Review    Row Name 04/21/19 0910 05/19/19 0840 06/24/19 0826 07/08/19 0818 07/22/19 0840     Core Components/Risk Factors/Patient Goals Review   Personal Goals Review  Weight Management/Obesity;Hypertension;Lipids;Improve shortness of breath with ADL's;Heart Failure  Weight Management/Obesity;Increase knowledge of respiratory medications and ability to use respiratory devices properly.;Improve shortness of breath with ADL's  Weight Management/Obesity;Hypertension;Heart Failure  Weight Management/Obesity;Hypertension;Heart Failure  Weight Management/Obesity;Hypertension;Heart Failure   Review  Eduardo [name] to lose some weight. His shortness of breath has improved slightly since the begining of the program. His blood pressure has been slightly elevated at 140/60 today. He wants to lose a little weight  and be around 135 pounds.  Eduardo [name] all meds as directed.  He says shortness of breath is about the same.  He does do some strength training at home on days not here.  He does some stretches for his back in the morning.  We discussed using how clothes fit, etc and not just the scale to measure weight loss.  Eduardo [name] to transition to virtual based  rehab due to increasing COVID numbers.  He got his first vaccine shot yesterday.  He was referred to Dr. Haroldine Laws this week for his heart failure.  Dr. Haroldine Laws suggested medical management given his age and encouraged home oxygen for night time to help.  Due to his heart failure and COPD, he has developed pulmonary hypertension and a leaky mitral valve.  Overall, he feels well and is maintaining his weight.  He is doing well with his weight and pressures.  His biggest compliant is his balance that he continues to work on.  He is scheduled to get his second vaccine dose on 2/9 and dental work later in February.  Overall, he is doing well.  Eduardo [name] had his second dose yesterday for COVID vaccine.  He said he had a rough night but only because his arm was very sore and he could not get comfortable.  He did almost fall the other day, but was able to catch himself. His weight and blood pressure have been good.  He did get a call from [facility] pharmacist for about one hour and they want to check his heart out some more on 3/3.  We talked about him coming to cardiac if he needs to when things calm down.   Expected Outcomes  Short: lose some weight. Long: maitnain weight loss independently.  Short :  continue strength work Long : maintain strength and improve overall stamina  Short: Continue to exercise to maintain weight and strength.  Long: Continue to maintain stamina.  Short: Continue to exercise to maintain weight and strength.  Long: Continue to maintain stamina.  Short: Continue to exercise for strength and see what they say about his heart.  Long: Continue  to manage heart failure.   Eduardo [name] Name 08/05/19 6333             Core Components/Risk Factors/Patient Goals Review   Personal Goals Review  Weight Management/Obesity;Hypertension;Heart Failure       Review  Weight has been steady and pressures have been good.       Expected Outcomes  Short: Continue to maintain weight  Long; Continue to manage heart failure.          Core Components/Risk Factors/Patient Goals at Discharge (Final Review):  Goals and Risk Factor Review - 08/05/19 0821      Core Components/Risk Factors/Patient Goals Review   Personal Goals Review  Weight Management/Obesity;Hypertension;Heart Failure    Review  Weight has been steady and pressures have been good.    Expected Outcomes  Short: Continue to maintain weight  Long; Continue to manage heart failure.       ITP Comments: ITP Comments    Row Name 03/26/19 1658 04/02/19 0902 04/15/19 0701 04/27/19 1515 05/13/19 1042   ITP Comments  Virtual Orientation complete. Visit diagnosis can be found in [facility]. RD and EP evaluation date on 03/31/2019 0930.  First full day of exercise!  Patient was oriented to gym and equipment including functions, settings, policies, and procedures.  Patient's individual exercise prescription and treatment plan were reviewed.  All starting workloads were established based on the results of the 6 minute walk test done at initial orientation visit.  The plan for exercise progression was also introduced and progression will be customized based on patient's performance and goals.  30 day review completed. Continue with ITP sent to Dr. Emily Filbert, Medical Director of Cardiac and Pulmonary Rehab for review , changes as needed and signature.   New to program  RD  eval deferred at this time - will continue to check in  30 day review competed . ITP sent to Dr Emily Filbert for review, changes as needed and ITP approval signature.   [place] Name 06/10/19 0915 06/24/19 0813 07/08/19 0817 07/22/19 0840 08/05/19 0807    ITP Comments  30 day review competed . ITP sent to Dr Emily Filbert for review, changes as needed and ITP approval signature  Completed virtual visit today.  He is scheduled for his next follow up on 1/27 at 8am.  Completed virtual follow up today.  Next follow up on 2/10 at 8am.  Completed virtual visit today.  Next follow up is 2/24 to graduate.  Completed virtual follow up/graduation today.     Comments: Discharge ITP from [employer]

## 2019-08-05 NOTE — Progress Notes (Signed)
Eduardo, [name] ([id number]) Visit Report for 08/05/2019 Abuse/Suicide Risk Screen Details Patient Name: Eduardo [name], Eduardo [name]. Date of Service: 08/05/2019 9:45 AM Medical Record Number: [id number] Patient Account Number: [id number] Date of Birth/Sex: 07-29-1930 (86 y.o. M) Treating RN: Army Melia Primary Care Raidon Swanner: [name] Other Clinician: Referring Letizia Hook: Ria Bush Treating Elle Vezina/Extender: Tito Dine in Treatment: 0 Abuse/Suicide Risk Screen Items Answer ABUSE RISK SCREEN: Has anyone close to you tried to hurt or harm you recentlyo No Do you feel uncomfortable with anyone in your familyo No Has anyone forced you do things that you didnot want to doo No Electronic Signature(s) Signed: 08/05/2019 4:52:55 PM By: Army Melia Entered By: Army Melia on 08/05/2019 10:02:53 Holtman, Eugine M. ([id number]) -------------------------------------------------------------------------------- Activities of Daily Living Details Patient Name: Eduardo [name]. Date of Service: 08/05/2019 9:45 AM Medical Record Number: [id number] Patient Account Number: [id number] Date of Birth/Sex: May 04, 1931 (86 y.o. M) Treating RN: Army Melia Primary Care Loel Betancur: [name] Other Clinician: Referring Que Meneely: Ria Bush Treating Dow Blahnik/Extender: Tito Dine in Treatment: 0 Activities of Daily Living Items Answer Activities of Daily Living (Please select one for each item) Drive Automobile Not Able Take Medications Completely Able Use Telephone Completely Able Care for Appearance Need Assistance Use Toilet Completely Able Bath / Shower Need Assistance Dress Self Completely Able Feed Self Completely Able Walk Completely Able Get In / Out Bed Completely Able Housework Need Assistance Prepare Meals Completely [place] for Self Completely Able Electronic Signature(s) Signed: 08/05/2019 4:52:55 PM  By: Army Melia Entered By: Army Melia on 08/05/2019 10:03:27 Brecheisen, Jahmel M. ([id number]) -------------------------------------------------------------------------------- Education Screening Details Patient Name: Eduardo [name]. Date of Service: 08/05/2019 9:45 AM Medical Record Number: [id number] Patient Account Number: [id number] Date of Birth/Sex: [birth year] (86 y.o. M) Treating RN: Army Melia Primary Care Libia Fazzini: [name] Other Clinician: Referring Dequavion Follette: Ria Bush Treating Meghan Warshawsky/Extender: Tito Dine in Treatment: 0 Primary Learner Assessed: Patient Learning Preferences/Education Level/Primary Language Learning Preference: Explanation Highest Education Level: High School Preferred Language: English Cognitive Barrier Language Barrier: No Translator Needed: No Memory Deficit: No Emotional Barrier: No Cultural/Religious Beliefs Affecting Medical Care: No Physical Barrier Impaired Vision: No Impaired Hearing: No Decreased Hand dexterity: No Knowledge/Comprehension Knowledge Level: High Comprehension Level: High Ability to understand written instructions: High Ability to understand verbal instructions: High Motivation Anxiety Level: Calm Cooperation: Cooperative Education Importance: Acknowledges Need Interest in Health Problems: Asks Questions Perception: Coherent Willingness to Engage in Self-Management High Activities: Readiness to Engage in Self-Management High Activities: Electronic Signature(s) Signed: 08/05/2019 4:52:55 PM By: Army Melia Entered By: Army Melia on 08/05/2019 10:03:55 Resh, Seyon M. ([id number]) -------------------------------------------------------------------------------- Fall Risk Assessment Details Patient Name: Eduardo [name]. Date of Service: 08/05/2019 9:45 AM Medical Record Number: [id number] Patient Account Number: [id number] Date of Birth/Sex: 1931/04/23 (86 y.o.  M) Treating RN: Army Melia Primary Care Emrik Erhard: [name] Other Clinician: Referring Samiya Mervin: Ria Bush Treating Brookie Wayment/Extender: Tito Dine in Treatment: 0 Fall Risk Assessment Items Have you had 2 or more falls in the last 12 monthso 0 Yes Have you had any fall that resulted in injury in the last 12 monthso 0 Yes FALLS RISK SCREEN History of falling - immediate or within 3 months 25 Yes Secondary diagnosis (Do you have 2 or more medical diagnoseso) 0 No Ambulatory aid None/bed rest/wheelchair/nurse 0 No Crutches/cane/walker 15 Yes Furniture 0 No Intravenous therapy Access/Saline/Heparin Lock 0 No Gait/Transferring Normal/ bed rest/ wheelchair 0 No Weak (short steps with  or without shuffle, stooped but able to lift head while walking, may seek 10 Yes support from furniture) Impaired (short steps with shuffle, may have difficulty arising from chair, head down, impaired 0 No balance) Mental Status Oriented to own ability 0 No Electronic Signature(s) Signed: 08/05/2019 4:52:55 PM By: Army Melia Entered By: Army Melia on 08/05/2019 10:04:05 Mcelwain, Jobany M. ([id number]) -------------------------------------------------------------------------------- Nutrition Risk Screening Details Patient Name: Eduardo [name]. Date of Service: 08/05/2019 9:45 AM Medical Record Number: [id number] Patient Account Number: [id number] Date of Birth/Sex: 1930/11/24 (86 y.o. M) Treating RN: Army Melia Primary Care Heru Montz: [name] Other Clinician: Referring Cienna Dumais: Ria Bush Treating Cono Gebhard/Extender: Tito Dine in Treatment: 0 Height (in): 62 Weight (lbs): 141 Body Mass Index (BMI): 25.8 Nutrition Risk Screening Items Score Screening NUTRITION RISK SCREEN: I have an illness or condition that made me change the kind and/or amount of food I eat 0 No I eat fewer than two meals per day 0 No I eat few fruits and  vegetables, or milk products 0 No I have three or more drinks of beer, liquor or wine almost every day 0 No I have tooth or mouth problems that make it hard for me to eat 0 No I don't always have enough money to buy the food I need 0 No I eat alone most of the time 0 No I take three or more different prescribed or over-the-counter drugs a day 0 No Without wanting to, I have lost or gained 10 pounds in the last six months 0 No I am not always physically able to shop, cook and/or feed myself 0 No Nutrition Protocols Good Risk Protocol 0 No interventions needed Moderate Risk Protocol High Risk Proctocol Risk Level: Good Risk Score: 0 Electronic Signature(s) Signed: 08/05/2019 4:52:55 PM By: Army Melia Entered By: Army Melia on 08/05/2019 10:04:12

## 2019-08-05 NOTE — Progress Notes (Signed)
Completed virtual follow up/graduation today.    Eduardo [name] has done well in rehab.  He is now using his [employer] Plume (at home stepper) daily.  Unfortunately, he fell at home again and has a huge hematoma on his arm.  He is getting it evaluated and it was recommended that he look into PT for balance.   He does have an appointment with the wound clinic.  He has also gotten the book Falling is not an Option for exercises to improve balance.  He has been sleeping in the chair as he has not been able to be comfortable in the bed.  Other than the pain from fall, he is doing well.  Weight has been steady and pressures have been good.

## 2019-08-05 NOTE — Telephone Encounter (Signed)
He has permanent atrial fibrillation, and so we know that he carries an elevated stroke risk, which is ~ 9.7% per year.  We always get a little nervous about taking pts off of anticoagulation when they have had a prior stroke.  If he is actively bleeding, it is certainly reasonable to hold for a few days (1-3). But in the absence of active bleeding, I would recommend continuing eliquis.  With regard to using anticoagulation in the setting of frequent falls, in general, risk of stroke off of anticoagulation generally outweighs risk major bleeding, even in those at high risk of falling.  Thus frequent falls, in the absence of head trauma, are not a reason by themselves to stop anticoagulation.

## 2019-08-05 NOTE — Telephone Encounter (Signed)
Spoke with patient and reviewed provider recommendations and he verbalized understanding. He confirmed upcoming appointment next week with no further questions at this time.

## 2019-08-06 ENCOUNTER — Ambulatory Visit: Payer: PPO | Admitting: Cardiovascular Disease

## 2019-08-06 ENCOUNTER — Encounter: Payer: Self-pay | Admitting: Cardiovascular Disease

## 2019-08-06 ENCOUNTER — Other Ambulatory Visit: Payer: Self-pay

## 2019-08-06 VITALS — BP 162/56 | HR 79 | Ht 62.0 in | Wt 143.5 lb

## 2019-08-06 DIAGNOSIS — I4821 Permanent atrial fibrillation: Secondary | ICD-10-CM | POA: Diagnosis not present

## 2019-08-06 DIAGNOSIS — E785 Hyperlipidemia, unspecified: Secondary | ICD-10-CM

## 2019-08-06 DIAGNOSIS — I5030 Unspecified diastolic (congestive) heart failure: Secondary | ICD-10-CM | POA: Diagnosis not present

## 2019-08-06 DIAGNOSIS — I10 Essential (primary) hypertension: Secondary | ICD-10-CM | POA: Diagnosis not present

## 2019-08-06 DIAGNOSIS — I5032 Chronic diastolic (congestive) heart failure: Secondary | ICD-10-CM

## 2019-08-06 DIAGNOSIS — I2721 Secondary pulmonary arterial hypertension: Secondary | ICD-10-CM | POA: Diagnosis not present

## 2019-08-06 DIAGNOSIS — I25119 Atherosclerotic heart disease of native coronary artery with unspecified angina pectoris: Secondary | ICD-10-CM

## 2019-08-06 NOTE — Patient Instructions (Addendum)
Medication Instructions:  Hold eliquis this week, Call me next week  If you need a refill on your cardiac medications before your next appointment, please call your pharmacy.    Lab work: No new labs needed   If you have labs (blood work) drawn today and your tests are completely normal, you will receive your results only by: Marland Kitchen MyChart Message (if you have MyChart) OR . A paper copy in the mail If you have any lab test that is abnormal or we need to change your treatment, we will call you to review the results.   Testing/Procedures: No new testing needed   Follow-Up: At [facility] you and your health needs are our priority.  As part of our continuing mission to provide you with exceptional heart care, we have created designated Provider Care Teams.  These Care Teams include your primary Cardiologist (physician) and Advanced Practice Providers (APPs -  Physician Assistants and Nurse Practitioners) who all work together to provide you with the care you need, when you need it.  . You will need a follow up appointment in 3 months   . Providers on your designated Care Team:   . [name], NP . Christell Faith, PA-C . Marrianne Mood, PA-C  Any Other Special Instructions Will Be Listed Below (If Applicable).  For educational health videos Log in to : [email or web address] Or : [email or web address], password : [email or web address]

## 2019-08-06 NOTE — Progress Notes (Signed)
Cardiology Office Note  Date:  08/06/2019   ID:  MEER REINDL, DOB 03-06-1931, MRN [id number]  PCP:  Venia Carbon, MD   Chief Complaint  Patient presents with  . office visit    Pt concerned w/ having several falls. Meds verbally reviewed w/ pt.    HPI:  Eduardo [name] is a 86 y.o. male  Smoking history but stopped smoking 86 yo cardiac catheterization in 2012 with moderate LAD and RCA disease STEMI with repeat catheterization documenting RCA dissection, managed medically chronic atrial fibrillation,  embolic stroke,  hypertension Right Carotid stenosis moderate 11/27/2017 LE arterial doppler ok 11/27/2017 COPD exacerbation 07/2016 Who presents for follow-up of his coronary disease, PAD  Several recent falls with trauma to his right forearm " Hematoma on a hematoma" He has been followed by the wound clinic, last seen yesterday Was told that wound is very close to being a major problem, " edges may explode".  It was recommended from wound clinic that family had a discussion concerning his anticoagulation  Wife on discussion today is very concerned about his frequent falls On discussion with the patient, reports that he uses a walker at home and when he makes a turn will often lose balance and fall down -Has not hit his head to date  Wife reports that anticoagulation was previously held under the guidance of Dr. Tamala Julian given frequent falls Some several months later had a TIA, no major stroke Went back on anticoagulation at the time  Legs have been getting weaker this year in the setting of Covid, quarantine, sedentary lifestyle  Seen by advanced heart failure clinic, has not had follow-up yet given the focus on his right forearm, falls, trauma, wound clinic   echocardiogram in March 2020 showed normal LV function with an RVSP of 68.6 mmHg, severely dilated left atrium, at least moderate mitral regurgitation Prior echocardiogram 2018 with right heart pressures of  50 Prior echocardiogram 2016 with right ventricular systolic pressure 34   underwent stress testing which was nonischemic, March 2020  EKG personally reviewed by myself on todays visit Shows atrial fibrillation ventricular rate 79 bpm intraventricular conduction delay  Other past medical history reviewed Carotid ultrasound with less than 39% bilateral carotid disease  GI bleed/profound anemia in July 2013 with a fall at the time Hemoglobin at that time was around 6. 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  12/2011.), Neuropathy (2014), Osteoarthritis (2002), Permanent atrial fibrillation ([place]) (2012), Stroke ([place]) (2016), and Upper GI bleed (08/2015).  PSH:    Past Surgical History:  Procedure Laterality Date  . APPENDECTOMY    . CARDIAC CATHETERIZATION  2012   50% LAD and luminal irreg in RCA, 1/12  Post cath MI from damage  . CARPAL TUNNEL RELEASE     left  . CARPAL TUNNEL RELEASE  10/11/11   Dr Rush Barer  . CATARACT EXTRACTION W/ INTRAOCULAR LENS IMPLANT  2/13   Dr [name]  . COLONOSCOPY  2001, 02/2010  Dr Deatra Ina.   . ESOPHAGOGASTRODUODENOSCOPY N/A 08/15/2015   [name] MD; 2 small gastric AVMs, 1 actively bleeding.  Both treated with APC ablation, clipping of the bleeder  . FOOT FRACTURE SURGERY     right heel repair  . FRACTURE SURGERY    . KNEE ARTHROSCOPY      left, right knee 10/2009  . MASTOIDECTOMY     x3 on right  . NASAL SEPTUM SURGERY     nasal septal repair  . PENILE PROSTHESIS  REMOVAL    . PENILE PROSTHESIS IMPLANT     x 2--got infection after 2nd and had to remove  . PILONIDAL CYST / SINUS EXCISION    . SHOULDER ARTHROSCOPY     right  . TONSILLECTOMY AND ADENOIDECTOMY      Current Outpatient Medications  Medication Sig Dispense Refill  . albuterol (PROVENTIL) (2.5 MG/3ML) 0.083% nebulizer solution Use 1 vial in nebulizer every 4 hours as needed for wheezing or shortness of breath 270 mL 2  . ALPRAZolam (XANAX) 0.25 MG tablet Take 1 tablet (0.25 mg total) by mouth 2 (two) times daily as needed for anxiety. 20 tablet 0  . atorvastatin (LIPITOR) 80 MG tablet Take 1 tablet (80 mg total) by mouth daily. 90 tablet 3  . B Complex-C (SUPER B COMPLEX PO) Take 1 tablet by mouth at bedtime.     . cetirizine (ZYRTEC) 10 MG tablet Take 10 mg by mouth at bedtime.     . cholecalciferol (VITAMIN D) 1000 units tablet Take 1,000 Units by mouth daily.    . clindamycin (CLEOCIN) 300 MG capsule Take 300 mg by mouth every 6 (six) hours.    Marland Kitchen ELIQUIS 5 MG TABS tablet TAKE ONE TABLET BY MOUTH TWICE DAILY  180 tablet 0  . escitalopram (LEXAPRO) 20 MG tablet Take 20 mg by mouth at bedtime.     . fenofibrate 160 MG tablet TAKE ONE TABLET BY MOUTH DAILY AT BEDTIME  90 tablet 3  . furosemide (LASIX) 40 MG tablet Take 1 tablet (40 mg total) by mouth daily. 90 tablet 3  . hydrALAZINE (APRESOLINE) 10 MG tablet TAKE ONE TABLET BY MOUTH THREE TIMES DAILY  270 tablet 0  . HYDROcodone-acetaminophen (NORCO/VICODIN) 5-325 MG tablet Take 1 tablet by mouth every 4 (four) hours as needed for moderate pain.    Marland Kitchen ipratropium (ATROVENT) 0.03 % nasal spray INSTILL TWO SPRAYS INTO BOTH NOSTRILS AT BEDTIME 30 mL 0  . lamoTRIgine (LAMICTAL) 150 MG tablet Take 150 mg by mouth at bedtime. Reported on 08/14/2015    . montelukast (SINGULAIR) 10 MG tablet TAKE ONE TABLET BY MOUTH  DAILY AT BEDTIME  90 tablet 0  . Multiple Vitamin (MULTIVITAMIN) tablet Take 1 tablet by mouth at bedtime.     . pantoprazole (PROTONIX) 40 MG tablet TAKE 1 TABLET BY MOUTH DAILY BEFORE BREAKFAST. 90 tablet 3  . STIOLTO RESPIMAT 2.5-2.5 MCG/ACT AERS INHALE TWO PUFFS BY MOUTH INTO THE LUNGS DAILY 12 g 0  . triamcinolone cream (KENALOG) 0.1 % Apply 1 application topically 2 (two) times daily as needed (for skin).     . verapamil (CALAN-SR) 240 MG CR tablet TAKE ONE TABLET BY MOUTH DAILY AT BEDTIME  90 tablet 0  . nitroGLYCERIN (NITROSTAT) 0.4 MG SL tablet Place 1 tablet (0.4 mg total) under the tongue every 5 (five) minutes as needed for chest pain. 90 tablet 3   No current facility-administered medications for this visit.     Allergies:  Penicillins, Sulfonamide derivatives, and Tape   Social History:  The patient  reports that he quit smoking about 41 years ago. His smoking use included cigarettes. He has a 75.00 pack-year smoking history. He has never used smokeless tobacco. He reports that he does not drink alcohol or use drugs.   Family History:   family history includes Cancer in his mother; Heart attack in his father; Stroke in his maternal grandmother.   Review of Systems: Review of Systems  Constitutional: Negative.   Respiratory: Positive for shortness of breath.   Cardiovascular: Negative.   Gastrointestinal: Negative.   Musculoskeletal: Positive for back pain.  Neurological: Negative.   Psychiatric/Behavioral: Negative.   All other systems reviewed and are negative.   PHYSICAL EXAM: VS:  BP (!) 162/56 (BP Location: Left Arm, Patient Position: Sitting, Cuff Size: Normal)   Pulse 79   Ht 5\' 2"  (1.575 m)   Wt 143 lb 8 oz (65.1 kg)   SpO2 96%   BMI 26.25 kg/m  , BMI Body mass index is 26.25 kg/m. Constitutional:  oriented to person, place, and time. No distress.  Presenting in a wheelchair HENT:  Head: Grossly normal Eyes:  no discharge. No scleral icterus.  Neck: No  JVD, no carotid bruits  Cardiovascular: Regular rate and rhythm, no murmurs appreciated Pulmonary/Chest: Clear to auscultation bilaterally, no wheezes or rails Abdominal: Soft.  no distension.  no tenderness.  Musculoskeletal: Normal range of motion Neurological:  normal muscle tone. Coordination normal. No atrophy Skin: Skin warm and dry. Bandage right forearm, swelling of right hand Psychiatric: normal affect, pleasant  Recent Labs: 03/19/2019: BUN 37; Creatinine, Ser 1.01; Hemoglobin 10.5; Platelets 190; Potassium 3.9; Sodium 141    Lipid Panel Lab Results  Component Value Date   CHOL 132 06/24/2017   HDL 44.80 06/24/2017   LDLCALC UNABLE TO CALCULATE IF TRIGLYCERIDE OVER 400 mg/dL 12/31/2014   TRIG (H) 06/24/2017    1031.0 Triglyceride is over 400; calculations on Lipids are invalid.      Wt Readings from Last 3 Encounters:  08/06/19 143 lb 8 oz (65.1 kg)  08/03/19 143 lb 5 oz (65 kg)  07/31/19 140 lb (63.5 kg)      ASSESSMENT AND PLAN:  Chronic atrial fibrillation ([place]) - Plan: EKG 12-Lead rate relatively well-controlled On verapamil, Long discussion concerning risk and benefit of anticoagulation -Several recent falls with trauma to right forearm Recommend he hold Eliquis for 1 week with discussion next Wednesday after wound clinic visit  Pulmonary hypertension  climbing pressures from 34 up to 50 now 68 on echocardiogram Seen by advanced heart failure clinic He has not had a chance to follow-up given several recent falls, trauma to forearm, trips to the wound clinic  Coronary artery disease of native artery of native heart with stable angina pectoris ([place]) -  Negative stress test earlier 2020 Chronic shortness of breath, denies anginal symptoms  Chronic diastolic HF (heart failure) (HCC) Lasix 40 daily, stable renal function  exacerbated by atrial fibrillation, MR Minimal edema  Bilateral carotid artery stenosis Continue aggressive cholesterol  management Cholesterol is at goal on the current lipid regimen. No changes to the medications were made.  Pulmonary emphysema, unspecified emphysema type ([place]) Followed by pulmonary  Disposition:   F/U  6 months  Long discussion concerning risk and benefit of anticoagulation  Total encounter time more than 45 minutes  Greater than 50% was spent in counseling and coordination of care with the patient    No orders of  the defined types were placed in this encounter.    Signed, Esmond Plants, M.D., Ph.D. 08/06/2019  [place], [place]

## 2019-08-07 ENCOUNTER — Telehealth: Payer: Self-pay | Admitting: Cardiovascular Disease

## 2019-08-07 NOTE — Telephone Encounter (Signed)
Patient wife called, asking what you tube video Dr. Rockey Situ recommended yesterday for him to do exercises. Please call and advise. States it is ok to leave a detailed message.

## 2019-08-07 NOTE — Telephone Encounter (Signed)
To Dr. Gollan to advise.   

## 2019-08-08 DIAGNOSIS — I6523 Occlusion and stenosis of bilateral carotid arteries: Secondary | ICD-10-CM | POA: Diagnosis not present

## 2019-08-08 DIAGNOSIS — I252 Old myocardial infarction: Secondary | ICD-10-CM | POA: Diagnosis not present

## 2019-08-08 DIAGNOSIS — S41101D Unspecified open wound of right upper arm, subsequent encounter: Secondary | ICD-10-CM | POA: Diagnosis not present

## 2019-08-08 DIAGNOSIS — G8911 Acute pain due to trauma: Secondary | ICD-10-CM | POA: Diagnosis not present

## 2019-08-08 DIAGNOSIS — I482 Chronic atrial fibrillation, unspecified: Secondary | ICD-10-CM | POA: Diagnosis not present

## 2019-08-08 DIAGNOSIS — Z87891 Personal history of nicotine dependence: Secondary | ICD-10-CM | POA: Diagnosis not present

## 2019-08-08 DIAGNOSIS — J449 Chronic obstructive pulmonary disease, unspecified: Secondary | ICD-10-CM | POA: Diagnosis not present

## 2019-08-08 DIAGNOSIS — K219 Gastro-esophageal reflux disease without esophagitis: Secondary | ICD-10-CM | POA: Diagnosis not present

## 2019-08-08 DIAGNOSIS — M48061 Spinal stenosis, lumbar region without neurogenic claudication: Secondary | ICD-10-CM | POA: Diagnosis not present

## 2019-08-08 DIAGNOSIS — I5032 Chronic diastolic (congestive) heart failure: Secondary | ICD-10-CM | POA: Diagnosis not present

## 2019-08-08 DIAGNOSIS — R0781 Pleurodynia: Secondary | ICD-10-CM | POA: Diagnosis not present

## 2019-08-08 DIAGNOSIS — M159 Polyosteoarthritis, unspecified: Secondary | ICD-10-CM | POA: Diagnosis not present

## 2019-08-08 DIAGNOSIS — Z7901 Long term (current) use of anticoagulants: Secondary | ICD-10-CM | POA: Diagnosis not present

## 2019-08-08 DIAGNOSIS — I2510 Atherosclerotic heart disease of native coronary artery without angina pectoris: Secondary | ICD-10-CM | POA: Diagnosis not present

## 2019-08-08 DIAGNOSIS — I110 Hypertensive heart disease with heart failure: Secondary | ICD-10-CM | POA: Diagnosis not present

## 2019-08-08 DIAGNOSIS — I051 Rheumatic mitral insufficiency: Secondary | ICD-10-CM | POA: Diagnosis not present

## 2019-08-08 DIAGNOSIS — F39 Unspecified mood [affective] disorder: Secondary | ICD-10-CM | POA: Diagnosis not present

## 2019-08-08 DIAGNOSIS — Z9181 History of falling: Secondary | ICD-10-CM | POA: Diagnosis not present

## 2019-08-09 NOTE — Telephone Encounter (Signed)
[place] [employer]

## 2019-08-09 NOTE — Telephone Encounter (Signed)
Rib series has not yet been read by radiology. Can we call and f/u on MOnday? thanks

## 2019-08-10 ENCOUNTER — Other Ambulatory Visit: Payer: Self-pay

## 2019-08-10 ENCOUNTER — Other Ambulatory Visit: Payer: Self-pay | Admitting: Family Medicine

## 2019-08-10 DIAGNOSIS — Z87891 Personal history of nicotine dependence: Secondary | ICD-10-CM | POA: Diagnosis not present

## 2019-08-10 DIAGNOSIS — J449 Chronic obstructive pulmonary disease, unspecified: Secondary | ICD-10-CM | POA: Diagnosis not present

## 2019-08-10 DIAGNOSIS — S41101D Unspecified open wound of right upper arm, subsequent encounter: Secondary | ICD-10-CM | POA: Diagnosis not present

## 2019-08-10 DIAGNOSIS — I482 Chronic atrial fibrillation, unspecified: Secondary | ICD-10-CM | POA: Diagnosis not present

## 2019-08-10 DIAGNOSIS — M48061 Spinal stenosis, lumbar region without neurogenic claudication: Secondary | ICD-10-CM | POA: Diagnosis not present

## 2019-08-10 DIAGNOSIS — I051 Rheumatic mitral insufficiency: Secondary | ICD-10-CM | POA: Diagnosis not present

## 2019-08-10 DIAGNOSIS — F39 Unspecified mood [affective] disorder: Secondary | ICD-10-CM | POA: Diagnosis not present

## 2019-08-10 DIAGNOSIS — I252 Old myocardial infarction: Secondary | ICD-10-CM | POA: Diagnosis not present

## 2019-08-10 DIAGNOSIS — Z9181 History of falling: Secondary | ICD-10-CM | POA: Diagnosis not present

## 2019-08-10 DIAGNOSIS — Z7901 Long term (current) use of anticoagulants: Secondary | ICD-10-CM | POA: Diagnosis not present

## 2019-08-10 DIAGNOSIS — I110 Hypertensive heart disease with heart failure: Secondary | ICD-10-CM | POA: Diagnosis not present

## 2019-08-10 DIAGNOSIS — I2510 Atherosclerotic heart disease of native coronary artery without angina pectoris: Secondary | ICD-10-CM | POA: Diagnosis not present

## 2019-08-10 DIAGNOSIS — E785 Hyperlipidemia, unspecified: Secondary | ICD-10-CM

## 2019-08-10 DIAGNOSIS — K219 Gastro-esophageal reflux disease without esophagitis: Secondary | ICD-10-CM | POA: Diagnosis not present

## 2019-08-10 DIAGNOSIS — M159 Polyosteoarthritis, unspecified: Secondary | ICD-10-CM | POA: Diagnosis not present

## 2019-08-10 DIAGNOSIS — R0781 Pleurodynia: Secondary | ICD-10-CM

## 2019-08-10 DIAGNOSIS — G8911 Acute pain due to trauma: Secondary | ICD-10-CM | POA: Diagnosis not present

## 2019-08-10 DIAGNOSIS — I6523 Occlusion and stenosis of bilateral carotid arteries: Secondary | ICD-10-CM | POA: Diagnosis not present

## 2019-08-10 DIAGNOSIS — I5032 Chronic diastolic (congestive) heart failure: Secondary | ICD-10-CM | POA: Diagnosis not present

## 2019-08-10 NOTE — Chronic Care Management (AMB) (Signed)
Chronic Care Management Pharmacy  Name: Eduardo [name]  MRN: [id number] DOB: [birth year]  Chief Complaint/ HPI  Eduardo [name],  86 y.o. , male presents with his wife for his Follow-Up CCM visit with the clinical pharmacist In office.  PCP : Venia Carbon, MD   Allergies  Allergen Reactions  . Penicillins Itching    Has patient had a PCN reaction causing immediate rash, facial/tongue/throat swelling, SOB or lightheadedness with hypotension: Yes Has patient had a PCN reaction causing severe rash involving mucus membranes or skin necrosis: No Has patient had a PCN reaction that required hospitalization No Has patient had a PCN reaction occurring within the last 10 years: No If all of the above answers are "NO", then may proceed with Cephalosporin use.   . Sulfonamide Derivatives Other (See Comments)    "it affected my kidneys"  . Tape Other (See Comments)    Arms black and blue, tears skin.  Please use "paper" tape   Current Diagnosis/Assessment:  Initial CCM visit: 07/17/19  Patient concerns: at previous visit, concerned about weight loss, reports today it is stable around 137 lbs, appetite good; would like to know if iron is low, reports low in the past, not supplementing; would like med review for anything that may cause imbalance.   Office visit:  08/06/19: Cardiology - hold Eliquis 1-3 days, discuss risks/benefits (patient is holding Eliquis on a week by week basis per cardiology while his wound heals)  08/03/19 - Danise Mina - wound evaluation/fall, xray ribs   Allergies  Allergen Reactions  . Penicillins Itching    Has patient had a PCN reaction causing immediate rash, facial/tongue/throat swelling, SOB or lightheadedness with hypotension: Yes Has patient had a PCN reaction causing severe rash involving mucus membranes or skin necrosis: No Has patient had a PCN reaction that required hospitalization No Has patient had a PCN reaction occurring within the last 10  years: No If all of the above answers are "NO", then may proceed with Cephalosporin use.   . Sulfonamide Derivatives Other (See Comments)    "it affected my kidneys"  . Tape Other (See Comments)    Arms black and blue, tears skin.  Please use "paper" tape   Current Outpatient Medications on File Prior to Visit  Medication Sig Dispense Refill  . albuterol (PROVENTIL) (2.5 MG/3ML) 0.083% nebulizer solution Use 1 vial in nebulizer every 4 hours as needed for wheezing or shortness of breath 270 mL 2  . ALPRAZolam (XANAX) 0.25 MG tablet Take 1 tablet (0.25 mg total) by mouth 2 (two) times daily as needed for anxiety. 20 tablet 0  . atorvastatin (LIPITOR) 80 MG tablet Take 1 tablet (80 mg total) by mouth daily. 90 tablet 3  . B Complex-C (SUPER B COMPLEX PO) Take 1 tablet by mouth at bedtime.     . cetirizine (ZYRTEC) 10 MG tablet Take 10 mg by mouth at bedtime.     . Cholecalciferol (VITAMIN D3) 50 MCG (2000 UT) TABS Take 50 mcg by mouth daily.    Marland Kitchen ELIQUIS 5 MG TABS tablet TAKE ONE TABLET BY MOUTH TWICE DAILY  180 tablet 0  . escitalopram (LEXAPRO) 20 MG tablet Take 20 mg by mouth at bedtime.     . fenofibrate 160 MG tablet TAKE ONE TABLET BY MOUTH DAILY AT BEDTIME  90 tablet 3  . furosemide (LASIX) 40 MG tablet Take 1 tablet (40 mg total) by mouth daily. 90 tablet 3  . hydrALAZINE (APRESOLINE) 10 MG tablet  TAKE ONE TABLET BY MOUTH THREE TIMES DAILY  270 tablet 0  . HYDROcodone-acetaminophen (NORCO/VICODIN) 5-325 MG tablet Take 1 tablet by mouth every 4 (four) hours as needed for moderate pain.    Marland Kitchen ipratropium (ATROVENT) 0.03 % nasal spray INSTILL TWO SPRAYS INTO BOTH NOSTRILS AT BEDTIME 30 mL 0  . lamoTRIgine (LAMICTAL) 150 MG tablet Take 150 mg by mouth at bedtime. Reported on 08/14/2015    . montelukast (SINGULAIR) 10 MG tablet TAKE ONE TABLET BY MOUTH DAILY AT BEDTIME  90 tablet 0  . Multiple Vitamin (MULTIVITAMIN) tablet Take 1 tablet by mouth at bedtime.     Marland Kitchen STIOLTO RESPIMAT 2.5-2.5  MCG/ACT AERS INHALE TWO PUFFS BY MOUTH INTO THE LUNGS DAILY 12 g 0  . verapamil (CALAN-SR) 240 MG CR tablet TAKE ONE TABLET BY MOUTH DAILY AT BEDTIME  90 tablet 0  . cholecalciferol (VITAMIN D) 1000 units tablet Take 1,000 Units by mouth daily.    . clindamycin (CLEOCIN) 300 MG capsule Take 300 mg by mouth every 6 (six) hours.    . nitroGLYCERIN (NITROSTAT) 0.4 MG SL tablet Place 1 tablet (0.4 mg total) under the tongue every 5 (five) minutes as needed for chest pain. 90 tablet 3  . pantoprazole (PROTONIX) 40 MG tablet TAKE 1 TABLET BY MOUTH DAILY BEFORE BREAKFAST. (Patient taking differently: TAKE 1 TABLET BY MOUTH EVERY OTHER DAY BEFORE BREAKFAST) 90 tablet 3  . triamcinolone cream (KENALOG) 0.1 % Apply 1 application topically 2 (two) times daily as needed (for skin).      No current facility-administered medications on file prior to visit.   Goals    . Pharmacy Care Plan     Current Barriers:  . Chronic Disease Management support, education, and care coordination needs related to hypertension, COPD, heart failure  Pharmacist Clinical Goal(s):  . Review medications for possible discontinuation. Due to history of gastrointestinal bleed, it is best to continue pantoprazole at current dose every other day. . Maintain blood pressure within goal of less than 140/90 mmHg. Recommend obtaining a new home blood pressure monitor with arm cuff. Continue current medications for now. . Ensure medications are not hindering balance. Recommend monitoring closely for low blood pressure. Also recommend discussing balance concerns with psychiatrist. Avoid over the counter sleep-aids such as doxylamine and diphenhydramine.  . Review iron lab work and assess. Will discuss resuming iron with Dr. [name].  Interventions: . Comprehensive medication review performed.  Patient Self Care Activities:  . Patient verbalizes understanding of plan to return to clinic for blood pressure monitor evaluation, Self administers  medications as prescribed, and Calls pharmacy for medication refills  Please see past updates related to this goal by clicking on the "Past Updates" button in the selected goal        Hypertension   BP today is: 122/64 mmHg (left arm, sitting, in office)  Office blood pressures are  BP Readings from Last 3 Encounters:  08/06/19 (!) 162/56  08/03/19 138/70  07/31/19 140/70    CMP Latest Ref Rng & Units 03/19/2019 08/12/2018 08/07/2018  Glucose 70 - 99 mg/dL 100(H) 128(H) 104(H)  BUN 8 - 23 mg/dL 37(H) 26 35(H)  Creatinine 0.61 - 1.24 mg/dL 1.01 0.95 1.39(H)  Sodium 135 - 145 mmol/L 141 141 142  Potassium 3.5 - 5.1 mmol/L 3.9 3.7 4.0  Chloride 98 - 111 mmol/L 106 101 102  CO2 22 - 32 mmol/L 26 24 24   Calcium 8.9 - 10.3 mg/dL 9.9 9.4 10.0  Total Protein 6.4 -  8.3 g/dL - - -  Total Bilirubin 0.2 - 1.2 mg/dL - - -  Alkaline Phos 40 - 150 U/L - - -  AST 5 - 34 U/L - - -  ALT 0 - 55 U/L - - -    BP goal: <140/90 mmHg Patient has failed these meds in the past: none reported Home blood pressure: We checked his two home blood pressure monitors in office today to see if they were accurate and were unable to get either to work. Pt plans to purchase a new monitor.  History: hydralazine started 2018 inpatient PRN due to concern for cough with COPD and ACE/ARB, hyperkalemia on Aldactone, beta blocker concern with COPD, concern for HCTZ with sulfa allergy (minimal evidence of allergic cross-reactivity between sulfonamide antimicrobials and non-antimicrobials)  Patient is currently uncontrolled on the following medications:   Hydralazine 10 mg - take one tablet three times daily  Verapamil 40 mg - take one tablet at bedtime (AFIB)  Lasix 40 mg - 1 tablet every morning  Plan: Continue current medications. Will review home readings at follow up. Discuss switching hydralazine to an ACE or ARB.  Hyperlipidemia   Lipid Panel - Fasting, collected today     Component Value Date/Time   CHOL  115 08/11/2019 1058   TRIG (H) 08/11/2019 1058    1142.0 Triglyceride is over 400; calculations on Lipids are invalid.   HDL 39.30 08/11/2019 1058   CHOLHDL 3 08/11/2019 1058   VLDL UNABLE TO CALCULATE IF TRIGLYCERIDE OVER 400 mg/dL 12/31/2014 0510   LDLCALC UNABLE TO CALCULATE IF TRIGLYCERIDE OVER 400 mg/dL 12/31/2014 0510   LDLDIRECT 65.0 08/11/2019 1058   LDL goal < 100, TG < 150 Patient has failed these meds in past: fish oil (did not lower TG), fenofibrate seems to have been restarted in 2014, with an initial decrease in TG (~200), but TG have continued to rise afterwards. Triglycerides > 1000 today. Patient is currently uncontrolled on the following medications:   Atorvastatin 80 mg - once daily at bedtime  Fenofibrate 160 mg - once daily with largest meal  We discussed: pt denies alcohol, long term history of elevated triglycerides, no history of pancreatitis, no secondary causes identified   Plan: Continue current medications. Patient has chronically elevated TG and has failed fish oil, denied additional treatment previously. Continue to recommend low carbohydrate and low fat diet as well as avoidance of alcohol.  GERD   History: GI bleed 2013, 2017 Patient has failed these meds in past: none Patient is currently controlled on the following medications:   Pantoprazole 40 mg - every other day   We discussed: interested in continued pantoprazole taper - current dose 40 mg QOD, denies reflux symptoms, however due to history of GI bleed, recommend continuation of therapy  Plan: Continue current medication  Pt reported: Iron Deficiency Anemia   CBC    Component Value Date/Time   WBC 7.1 03/19/2019 1605   RBC 3.60 (L) 03/19/2019 1605   HGB 10.5 (L) 03/19/2019 1605   HGB 10.2 (L) 07/01/2017 1218   HGB 11.4 (L) 09/08/2011 0956   HCT 33.0 (L) 03/19/2019 1605   HCT 35.4 (L) 09/08/2011 0956   PLT 190 03/19/2019 1605   PLT 175 07/01/2017 1218   PLT 203 09/08/2011 0956   MCV  91.7 03/19/2019 1605   MCV 85 09/08/2011 0956   MCH 29.2 03/19/2019 1605   MCHC 31.8 03/19/2019 1605   RDW 16.0 (H) 03/19/2019 1605   RDW 14.7 (H)  09/08/2011 0956   LYMPHSABS 1.4 03/24/2018 1631   MONOABS 0.6 03/24/2018 1631   EOSABS 0.3 03/24/2018 1631   BASOSABS 0.0 03/24/2018 1631   Iron/TIBC/Ferritin/ %Sat    Component Value Date/Time   IRON 70 08/17/2015 1558   TIBC 462 (H) 08/17/2015 1558   FERRITIN 26 08/17/2015 1558   IRONPCTSAT 15 (L) 08/17/2015 1558   Patient has failed these meds in past: none  Patient is currently controlled on the following medications: no pharmacotherapy  We discussed: per chart review, GI bleed 2013 and chronic iron deficiency, also on warfarin at the time. Iron was stopped due to hemoglobin > 11 and constipation. CBC low, but stable. As TSAT < 19 and Ferritin < 30 on previous lab, may benefit from iron supplementation. May also increase iron-fortified foods such as grains, beans, and dark leafy vegetables if unable to tolerate oral dosing.  Plan: Discuss with PCP starting iron supplement three days a week with colace to prevent constipation. Take on empty stomach on opposite days as pantoprazole.  Imbalance Med Review   Patient is currently on the following medications that may contribute to dizziness or imbalance:  Hydrocodone - uses very sparingly  Alprazolam - uses very sparingly  Cetirizine - takes at bedtime  Escitalopram - takes at bedtime (Dr. Nicolasa Ducking)  Lamotrigine - takes at bedtime (Dr. Nicolasa Ducking)  Hydralazine, verapamil, furosemide - monitor for hypotension   Robitussin DM nighttime - avoid frequent use   We discussed: monitor for hypotension with new BP monitor; discuss concerns regarding imbalance with psychiatry, avoid OTC first gen antihistamine and anticholinergics  Plan: Continue current medications   CCM Follow Up: September 24, 2019 at 9:00 AM (telephone)  Debbora Dus, PharmD Clinical Pharmacist [facility] at  [facility] [phone]

## 2019-08-10 NOTE — Telephone Encounter (Signed)
Spoke with patients wife and reviewed the videos he was referring to was [employer]. She was appreciative for the call back with no further questions at this time.

## 2019-08-11 ENCOUNTER — Telehealth: Payer: Self-pay | Admitting: Internal Medicine

## 2019-08-11 ENCOUNTER — Other Ambulatory Visit: Payer: Self-pay

## 2019-08-11 ENCOUNTER — Other Ambulatory Visit (INDEPENDENT_AMBULATORY_CARE_PROVIDER_SITE_OTHER): Payer: PPO

## 2019-08-11 ENCOUNTER — Ambulatory Visit: Payer: PPO

## 2019-08-11 DIAGNOSIS — E785 Hyperlipidemia, unspecified: Secondary | ICD-10-CM

## 2019-08-11 DIAGNOSIS — R2689 Other abnormalities of gait and mobility: Secondary | ICD-10-CM

## 2019-08-11 DIAGNOSIS — I10 Essential (primary) hypertension: Secondary | ICD-10-CM

## 2019-08-11 DIAGNOSIS — K219 Gastro-esophageal reflux disease without esophagitis: Secondary | ICD-10-CM

## 2019-08-11 LAB — LIPID PANEL
Cholesterol: 115 mg/dL (ref 0–200)
HDL: 39.3 mg/dL (ref >39.00)
Total CHOL/HDL Ratio: 3
Triglycerides: 1142 mg/dL — ABNORMAL HIGH (ref 0.0–149.0)

## 2019-08-11 LAB — LDL CHOLESTEROL, DIRECT: Direct LDL: 65 mg/dL

## 2019-08-11 NOTE — Telephone Encounter (Signed)
[place], [facility] nurse with [facility] called today She stated when she seen the patient he stated that his ribs were fractured.  They are needing verify this.,   [facility] C/b # [phone]

## 2019-08-11 NOTE — Telephone Encounter (Signed)
plz clarify - he has old rib fractures, not new rib fractures.  Should be ok to proceed with HH. Would use pain as guide - if something hurts, don't do it.

## 2019-08-11 NOTE — Telephone Encounter (Signed)
Can I inform the [facility] nurse of rib fx?

## 2019-08-12 ENCOUNTER — Ambulatory Visit: Payer: PPO | Admitting: Nurse Practitioner

## 2019-08-12 ENCOUNTER — Encounter: Payer: PPO | Attending: Internal Medicine | Admitting: Internal Medicine

## 2019-08-12 DIAGNOSIS — S51811A Laceration without foreign body of right forearm, initial encounter: Secondary | ICD-10-CM | POA: Diagnosis not present

## 2019-08-12 DIAGNOSIS — I4891 Unspecified atrial fibrillation: Secondary | ICD-10-CM | POA: Diagnosis not present

## 2019-08-12 DIAGNOSIS — J449 Chronic obstructive pulmonary disease, unspecified: Secondary | ICD-10-CM | POA: Insufficient documentation

## 2019-08-12 DIAGNOSIS — I5032 Chronic diastolic (congestive) heart failure: Secondary | ICD-10-CM | POA: Diagnosis not present

## 2019-08-12 DIAGNOSIS — I2510 Atherosclerotic heart disease of native coronary artery without angina pectoris: Secondary | ICD-10-CM | POA: Diagnosis not present

## 2019-08-12 DIAGNOSIS — X58XXXD Exposure to other specified factors, subsequent encounter: Secondary | ICD-10-CM | POA: Insufficient documentation

## 2019-08-12 DIAGNOSIS — Z7901 Long term (current) use of anticoagulants: Secondary | ICD-10-CM | POA: Insufficient documentation

## 2019-08-12 DIAGNOSIS — S40021D Contusion of right upper arm, subsequent encounter: Secondary | ICD-10-CM | POA: Insufficient documentation

## 2019-08-12 DIAGNOSIS — I252 Old myocardial infarction: Secondary | ICD-10-CM | POA: Insufficient documentation

## 2019-08-12 DIAGNOSIS — T45515S Adverse effect of anticoagulants, sequela: Secondary | ICD-10-CM | POA: Diagnosis not present

## 2019-08-12 DIAGNOSIS — I110 Hypertensive heart disease with heart failure: Secondary | ICD-10-CM | POA: Insufficient documentation

## 2019-08-12 DIAGNOSIS — G629 Polyneuropathy, unspecified: Secondary | ICD-10-CM | POA: Insufficient documentation

## 2019-08-12 NOTE — Progress Notes (Signed)
Eduardo, [name] ([id number]) Visit Report for 08/12/2019 Arrival Information Details Patient Name: Eduardo [name], Eduardo [name]. Date of Service: 08/12/2019 10:45 AM Medical Record Number: [id number] Patient Account Number: [id number] Date of Birth/Sex: 1930-12-30 (86 y.o. M) Treating RN: Army Melia Primary Care Gabryela Kimbrell: [name] Other Clinician: Referring Kely Dohn: [name] Treating Makell Drohan/Extender: Tito Dine in Treatment: 1 Visit Information History Since Last Visit Added or deleted any medications: No Patient Arrived: Walker Any new allergies or adverse reactions: No Arrival Time: 10:42 Had a fall or experienced change in No Accompanied By: wife activities of daily living that may affect Transfer Assistance: None risk of falls: Patient Identification Verified: Yes Signs or symptoms of abuse/neglect since last visito No Hospitalized since last visit: No Has Dressing in Place as Prescribed: Yes Pain Present Now: No Electronic Signature(s) Signed: 08/12/2019 11:20:56 AM By: Army Melia Entered By: Army Melia on 08/12/2019 10:42:47 Kahan, Talha M. ([id number]) -------------------------------------------------------------------------------- Encounter Discharge Information Details Patient Name: Eduardo [name]. Date of Service: 08/12/2019 10:45 AM Medical Record Number: [id number] Patient Account Number: [id number] Date of Birth/Sex: Oct 11, 1930 (86 y.o. M) Treating RN: Cornell Barman Primary Care Ennifer Harston: [name] Other Clinician: Referring Masiya Claassen: [name] Treating Crit Obremski/Extender: Tito Dine in Treatment: 1 Encounter Discharge Information Items Post Procedure Vitals Discharge Condition: Stable Temperature (F): 97.8 Ambulatory Status: Ambulatory Pulse (bpm): 73 Discharge Destination: Home Respiratory Rate (breaths/min): 16 Transportation: Private Auto Blood Pressure (mmHg): 150/84 Accompanied By: self Schedule  Follow-up Appointment: Yes Clinical Summary of Care: Electronic Signature(s) Signed: 08/12/2019 4:40:39 PM By: Gretta Cool, BSN, RN, CWS, Kim RN, BSN Entered By: Gretta Cool, BSN, RN, CWS, Kim on 08/12/2019 11:19:22 Seivert, Corleone Jerilynn Mages ([id number]) -------------------------------------------------------------------------------- Lower Extremity Assessment Details Patient Name: Eduardo [name]. Date of Service: 08/12/2019 10:45 AM Medical Record Number: [id number] Patient Account Number: [id number] Date of Birth/Sex: [birth year] (86 y.o. M) Treating RN: Army Melia Primary Care Roderica Cathell: [name] Other Clinician: Referring Karigan Cloninger: [name] Treating Nazariah Cadet/Extender: Tito Dine in Treatment: 1 Electronic Signature(s) Signed: 08/12/2019 11:20:56 AM By: Army Melia Entered By: Army Melia on 08/12/2019 10:49:50 Douse, Diarra M. ([id number]) -------------------------------------------------------------------------------- Multi Wound Chart Details Patient Name: Eduardo [name]. Date of Service: 08/12/2019 10:45 AM Medical Record Number: [id number] Patient Account Number: [id number] Date of Birth/Sex: [birth year] (86 y.o. M) Treating RN: Cornell Barman Primary Care Masha Orbach: [name] Other Clinician: Referring Brynli Ollis: [name] Treating Cianna Kasparian/Extender: Tito Dine in Treatment: 1 Vital Signs Height(in): 62 Pulse(bpm): 102 Weight(lbs): 141 Blood Pressure(mmHg): 150/84 Body Mass Index(BMI): 26 Temperature(F): 97.8 Respiratory Rate(breaths/min): 16 Photos: [N/A:N/A] Wound Location: Right Forearm N/A N/A Wounding Event: Trauma N/A N/A Primary Etiology: Skin Tear N/A N/A Comorbid History: Glaucoma, Chronic Obstructive N/A N/A Pulmonary Disease (COPD), Arrhythmia, Congestive Heart Failure, Coronary Artery Disease, Myocardial Infarction, Neuropathy Date Acquired: 07/29/2019 N/A N/A Weeks of Treatment: 1 N/A N/A Wound Status: Open N/A  N/A Measurements L x W x D (cm) 4.4x4x0.1 N/A N/A Area (cm) : 13.823 N/A N/A Volume (cm) : 1.382 N/A N/A % Reduction in Area: 23.80% N/A N/A % Reduction in Volume: 23.80% N/A N/A Classification: Full Thickness Without Exposed N/A N/A Support Structures Exudate Amount: Medium N/A N/A Exudate Type: Serosanguineous N/A N/A Exudate Color: red, brown N/A N/A Wound Margin: Flat and Intact N/A N/A Granulation Amount: Large (67-100%) N/A N/A Granulation Quality: Red N/A N/A Necrotic Amount: Small (1-33%) N/A N/A Exposed Structures: Fat Layer (Subcutaneous Tissue) N/A N/A Exposed: Yes Fascia: No Loge, Dondrell M. ([id number]) Tendon: No Muscle: No Joint: No  Bone: No Epithelialization: None N/A N/A Debridement: Debridement - Excisional N/A N/A Pre-procedure Verification/Time 10:59 N/A N/A Out Taken: Pain Control: Lidocaine N/A N/A Tissue Debrided: Blood Clots, Subcutaneous N/A N/A Level: Skin/Subcutaneous Tissue N/A N/A Debridement Area (sq cm): 17.6 N/A N/A Instrument: Blade, Curette, Forceps, Scissors N/A N/A Bleeding: Large N/A N/A Hemostasis Achieved: Silver Nitrate N/A N/A Debridement Treatment Procedure was tolerated well N/A N/A Response: Post Debridement Measurements 4.4x4x1.6 N/A N/A L x W x D (cm) Post Debridement Volume: (cm) 22.117 N/A N/A Procedures Performed: Debridement N/A N/A Treatment Notes Wound #2 (Right Forearm) Notes Silvercell, Abd, conform, ace wrap Electronic Signature(s) Signed: 08/12/2019 4:59:37 PM By: Linton Ham MD Entered By: Linton Ham on 08/12/2019 11:51:19 Boesen, Ewan Jerilynn Mages ([id number]) -------------------------------------------------------------------------------- Multi-Disciplinary Care Plan Details Patient Name: Eduardo [name]. Date of Service: 08/12/2019 10:45 AM Medical Record Number: [id number] Patient Account Number: [id number] Date of Birth/Sex: 09-10-1930 (86 y.o. M) Treating RN: Cornell Barman Primary Care Ciearra Rufo:  [name] Other Clinician: Referring Maekayla Giorgio: [name] Treating Mcgregor Tinnon/Extender: Tito Dine in Treatment: 1 Active Inactive Abuse / Safety / Falls / Self Care Management Nursing Diagnoses: History of Falls Goals: Patient will not experience any injury related to falls Date Initiated: 08/05/2019 Target Resolution Date: 08/19/2019 Goal Status: Active Interventions: Assess fall risk on admission and as needed Notes: Necrotic Tissue Nursing Diagnoses: Impaired tissue integrity related to necrotic/devitalized tissue Goals: Necrotic/devitalized tissue will be minimized in the wound bed Date Initiated: 08/05/2019 Target Resolution Date: 08/19/2019 Goal Status: Active Interventions: Assess patient pain level pre-, during and post procedure and prior to discharge Treatment Activities: Apply topical anesthetic as ordered : 08/05/2019 Notes: Orientation to the Wound Care Program Nursing Diagnoses: Knowledge deficit related to the wound healing center program Goals: Patient/caregiver will verbalize understanding of the [facility] Date Initiated: 08/05/2019 Target Resolution Date: 08/19/2019 Goal Status: Active Interventions: Provide education on orientation to the wound center Notes: Pain, Acute or Chronic Silfies, Zeus M. ([id number]) Nursing Diagnoses: Potential alteration in comfort, pain Goals: Patient will verbalize adequate pain control and receive pain control interventions during procedures as needed Date Initiated: 08/05/2019 Target Resolution Date: 08/19/2019 Goal Status: Active Interventions: Assess comfort goal upon admission Complete pain assessment as per visit requirements Notes: Wound/Skin Impairment Nursing Diagnoses: Impaired tissue integrity Goals: Patient/caregiver will verbalize understanding of skin care regimen Date Initiated: 08/05/2019 Target Resolution Date: 08/19/2019 Goal Status: Active Ulcer/skin  breakdown will have a volume reduction of 30% by week 4 Date Initiated: 08/05/2019 Target Resolution Date: 09/02/2019 Goal Status: Active Interventions: Assess patient/caregiver ability to obtain necessary supplies Assess ulceration(s) every visit Treatment Activities: Patient referred to home care : 08/05/2019 Notes: Electronic Signature(s) Signed: 08/12/2019 4:40:39 PM By: Gretta Cool, BSN, RN, CWS, Kim RN, BSN Entered By: Gretta Cool, BSN, RN, CWS, Kim on 08/12/2019 10:58:00 Worthey, Umair Jerilynn Mages ([id number]) -------------------------------------------------------------------------------- Pain Assessment Details Patient Name: Eduardo [name]. Date of Service: 08/12/2019 10:45 AM Medical Record Number: [id number] Patient Account Number: [id number] Date of Birth/Sex: 04/12/1931 (86 y.o. M) Treating RN: Army Melia Primary Care Abagale Boulos: [name] Other Clinician: Referring Khalif Stender: [name] Treating Amarylis Rovito/Extender: Tito Dine in Treatment: 1 Active Problems Location of Pain Severity and Description of Pain Patient Has Paino No Site Locations Pain Management and Medication Current Pain Management: Electronic Signature(s) Signed: 08/12/2019 11:20:56 AM By: Army Melia Entered By: Army Melia on 08/12/2019 10:43:27 Carlini, Terek M. ([id number]) -------------------------------------------------------------------------------- Patient/Caregiver Education Details Patient Name: Eduardo [name]. Date of Service: 08/12/2019 10:45 AM Medical Record Number:  [id number] Patient Account Number: [id number] Date of Birth/Gender: [birth year] (86 y.o. M) Treating RN: Cornell Barman Primary Care Physician: [name] Other Clinician: Referring Physician: [name] Treating Physician/Extender: Tito Dine in Treatment: 1 Education Assessment Education Provided To: Patient Education Topics Provided Wound Debridement: Handouts: Wound  Debridement Methods: Demonstration, Explain/Verbal Responses: State content correctly Electronic Signature(s) Signed: 08/12/2019 4:40:39 PM By: Gretta Cool, BSN, RN, CWS, Kim RN, BSN Entered By: Gretta Cool, BSN, RN, CWS, Kim on 08/12/2019 11:13:44 Edelson, Calla Kicks ([id number]) -------------------------------------------------------------------------------- Wound Assessment Details Patient Name: Eduardo [name]. Date of Service: 08/12/2019 10:45 AM Medical Record Number: [id number] Patient Account Number: [id number] Date of Birth/Sex: March 21, 1931 (86 y.o. M) Treating RN: [name] Primary Care Wadell Craddock: [name] Other Clinician: Referring Analysia Dungee: [name] Treating Tillmon Kisling/Extender: Tito Dine in Treatment: 1 Wound Status Wound Number: 2 Primary Skin Tear Etiology: Wound Location: Right Forearm Wound Open Wounding Event: Trauma Status: Date Acquired: 07/29/2019 Comorbid Glaucoma, Chronic Obstructive Pulmonary Disease (COPD), Weeks Of Treatment: 1 History: Arrhythmia, Congestive Heart Failure, Coronary Artery Clustered Wound: No Disease, Myocardial Infarction, Neuropathy Photos Wound Measurements Length: (cm) 4.4 Width: (cm) 4 Depth: (cm) 0.1 Area: (cm) 13.823 Volume: (cm) 1.382 % Reduction in Area: 23.8% % Reduction in Volume: 23.8% Epithelialization: None Tunneling: No Undermining: No Wound Description Classification: Full Thickness Without Exposed Support Structu Wound Margin: Flat and Intact Exudate Amount: Medium Exudate Type: Serosanguineous Exudate Color: red, brown res Foul Odor After Cleansing: No Slough/Fibrino Yes Wound Bed Granulation Amount: Large (67-100%) Exposed Structure Granulation Quality: Red Fascia Exposed: No Necrotic Amount: Small (1-33%) Fat Layer (Subcutaneous Tissue) Exposed: Yes Necrotic Quality: Adherent Slough Tendon Exposed: No Muscle Exposed: No Joint Exposed: No Bone Exposed: No Treatment Notes Wound  #2 (Right Forearm) Notes Silvercell, Abd, conform, ace wrap Electronic Signature(s) Pellum, Tor M. ([id number]) Signed: 08/12/2019 4:27:26 PM By: [name] Previous Signature: 08/12/2019 11:20:56 AM Version By: Army Melia Entered By: [name] on 08/12/2019 11:25:08 Duncombe, Weyman M. ([id number]) -------------------------------------------------------------------------------- Vitals Details Patient Name: Eduardo [name]. Date of Service: 08/12/2019 10:45 AM Medical Record Number: [id number] Patient Account Number: [id number] Date of Birth/Sex: Sep 24, 1930 (86 y.o. M) Treating RN: Army Melia Primary Care Barry Faircloth: [name] Other Clinician: Referring Santanna Whitford: [name] Treating Raylan Hanton/Extender: Tito Dine in Treatment: 1 Vital Signs Time Taken: 10:42 Temperature (F): 97.8 Height (in): 62 Pulse (bpm): 73 Weight (lbs): 141 Respiratory Rate (breaths/min): 16 Body Mass Index (BMI): 25.8 Blood Pressure (mmHg): 150/84 Reference Range: 80 - 120 mg / dl Electronic Signature(s) Signed: 08/12/2019 11:20:56 AM By: Army Melia Entered By: Army Melia on 08/12/2019 10:43:21

## 2019-08-12 NOTE — Telephone Encounter (Signed)
Spoke with Crystal relaying Dr. Synthia Innocent message.  She verbalizes understanding.

## 2019-08-12 NOTE — Progress Notes (Signed)
KYZER, BLOWE [place]. ([id number]) Visit Report for 08/12/2019 Debridement Details Patient Name: Eduardo [name], Eduardo [name]. Date of Service: 08/12/2019 10:45 AM Medical Record Number: [id number] Patient Account Number: [id number] Date of Birth/Sex: 1931-03-20 (86 y.o. M) Treating RN: Cornell Barman Primary Care Provider: [name] Other Clinician: Referring Provider: [name] Treating Provider/Extender: Tito Dine in Treatment: 1 Debridement Performed for Wound #2 Right Forearm Assessment: Performed By: Physician Ricard Dillon, MD Debridement Type: Debridement Level of Consciousness (Pre- Awake and Alert procedure): Pre-procedure Verification/Time Out Yes - 10:59 Taken: Start Time: 10:59 Pain Control: Lidocaine Total Area Debrided (L x W): 4.4 (cm) x 4 (cm) = 17.6 (cm) Tissue and other material debrided: Viable, Non-Viable, Blood Clots, Subcutaneous, Skin: Epidermis Level: Skin/Subcutaneous Tissue Debridement Description: Excisional Instrument: Blade, Curette, Forceps, Scissors Bleeding: Large Hemostasis Achieved: Silver Nitrate End Time: 11:05 Response to Treatment: Procedure was tolerated well Level of Consciousness (Post- Awake and Alert procedure): Post Debridement Measurements of Total Wound Length: (cm) 4.4 Width: (cm) 4 Depth: (cm) 1.6 Volume: (cm) 22.117 Character of Wound/Ulcer Post Debridement: Stable Post Procedure Diagnosis Same as Pre-procedure Electronic Signature(s) Signed: 08/12/2019 4:40:39 PM By: Gretta Cool, BSN, RN, CWS, Kim RN, BSN Signed: 08/12/2019 4:59:37 PM By: Linton Ham MD Entered By: Linton Ham on 08/12/2019 11:55:53 [name], [name] ([id number]) -------------------------------------------------------------------------------- HPI Details Patient Name: Eduardo [name]. Date of Service: 08/12/2019 10:45 AM Medical Record Number: [id number] Patient Account Number: [id number] Date of Birth/Sex: Oct 16, 1930 (86 y.o.  M) Treating RN: Cornell Barman Primary Care Provider: [name] Other Clinician: Referring Provider: [name] Treating Provider/Extender: Tito Dine in Treatment: 1 History of Present Illness HPI Description: 11/08/17 on evaluation today patient presents initially concerning a right dramatic lower extremity ulcer/skin tear which occurred three weeks ago. He states that he initially was seen in urgent care where the area was sutured closed. The sutures stayed in for two weeks he tells me. Subsequently he was seen at Dr. Alla German office where the sutures were removed and Steri-Strips were placed over the next five days. Subsequently these were also removed and unfortunately at this time it was noted that the patient had a dehisced larger wound that required further attention. The patient and his wife actually requested referral to the wound care center in [place]. [name] made the referral to [place]. Subsequently the patient does have a history of atrial fibrillation, cardiovascular disease, COPD, stroke, and is on long-term Eliquis. There's no history of diabetes that he states. Subsequently he has no other major medical problems currently. No fevers, chills, nausea, or vomiting noted at this time. 11/14/17-He is seen in follow-up evaluation for right posterior lower extremity ulcer. There is improvement with granulation tissue present; debridement. We will continue with same treatment plan he'll follow-up next week. The vascular office did call to establish an appointment, but he forgot that we had referred him and did not make an appointment. He was encouraged to contact the office to schedule an appointment for evaluation. 11/22/17 on evaluation today patient actually appears to be doing well in regard to his ulcer. This has cleaned up very nicely in my pinion I do believe the Iodoflex has been of benefit for him. He actually does have his arterial study which is scheduled for 11/27/17. In  general he's having no significant discomfort mainly just with cleansing of the wound but the dressings themselves don't seem to be causing him problems. 11/29/17 on evaluation today patient appears to be doing very well in regard to his lower  extremity ulcer. With that being said he unfortunately prior to coming in for evaluation today did have a trip over a world where he failed hurting his back and striking his head. With that being said he has not had any altered mental status since that time and states that he's not really any more dizzy and there does not appear to be any residual effect from this. He also does not have a significant headache. With that being said this is something I did check out for him today briefly at least since he was in our office. I do think that he needs to look out for any signs or symptoms of anything worsening such as nausea, vomiting, fever, chills, confusion, or severe headaches. He has no visual disturbance at this point. 12/06/17 on evaluation today patient appears to be doing excellent in regard to his lower extremity ulcer. This is starting to really fill in which is good news as well. We do have the results of the arterial study unfortunately they did not perform the TBI portion of the examination and his ABI's were noncompressible. With that being said they did state that he had triphasic blood flow throughout the bilateral lower extremities and there was apparently no evidence of arterial disease on the arterial ultrasound. Overall this is good news we will be able to initiate stronger compression therapy which should help to allow this wound to heal even faster as the patient still continues to have some pitting edema. 12/10/17 on evaluation today patient appears to be doing much better in regard to his lower extremity ulcer. He has been tolerating the dressing changes without complication. Fortunately the compression wrap seems to be doing excellent at this  time and overall he is showing signs of good improvement. I'm happy with the overall progress that has been made over the past several weeks but especially in the past week. The patient and his wife both are extremely pleased. 12/18/17; right posterior calf ulcer. Healthy granulation. Dimension slightly smaller. Using silver collagen under compression 12/24/17 on evaluation today patient presents for follow-up concerning his ongoing right lower extremity ulcer. He has been tolerating the dressing changes without complication. Fortunately he seems to be doing excellent at this point. There does not appear to be any evidence of infection. 12/31/17 on evaluation today patient appears to be doing excellent in regard to his right lower extremity ulcer. He has been tolerating the dressing changes without complication. The wrap appears to have been very beneficial for him up to this point. With that being said he shows no signs of infection at this time and overall I'm very pleased with how things seem to be progressing. 01/07/18 on evaluation today patient actually appears to be doing excellent in regard to his ulcer. In fact there was a lot of dried dressing material noted covering the wound during evaluation today. Once I begin to remove this utilizing a curette I was noted that the patient actually had significant epithelialization underlying this event was just a very small region that appears to still be open otherwise the remainder appears to be closed. Obviously this is excellent news and I think he is progressing very nicely. 01/14/18-he is seen in follow-up evaluation. There is dried dressing which was removed to reveal an epithelialized wound. He will be discharged from services today [place] 08/05/2019 This is a somewhat frail 86 year old man accompanied by his wife. He was in this clinic in 2009 with a traumatic right lower extremity wound. He comes  in this time having suffered a fall on  07/07/2019 primary physician noted a hematoma and x-ray of the arm was negative. It was a bit difficult difficult to figure this out but looking through home health link however his wife says this area actually closed over and the hematoma resolved however he had a second fall on 07/29/2019 with a more substantial hematoma involving the lateral aspect of the right elbow a skin tear over this area and a substantial amount of bleeding in the posterior upper humerus area. Again an x-ray was negative. He has been applying Vaseline gauze twice daily with the help of his wife. The wound was debrided by primary care on 07/31/2019. BON, DOWIS ([id number]) His wife states that she is overwhelmed with the idea of having to care for this wound and request home health. And is much as a can guarantee anything these days with home health we will try to get somebody out to help her with the dressing. The idea of stopping the Eliquis that he is on for chronic atrial fibrillation in an elderly man who falls has been brought up before and apparently was done once with the assistance of Dr. Tamala Julian his cardiologist in [place] however he apparently had a mini stroke shortly thereafter so he is back on Eliquis. He apparently has gait instability he has a walker Past medical history; atrial fibrillation on Eliquis COPD, history of cerebrovascular disease, hypertension, diastolic heart failure COPD and coronary artery disease 08/12/2019; this is a patient with a substantial hematoma over the lateral epicondyle of the right elbow. I left this intact last week and wrapped with compression however he arrives in clinic this week with 3 gaping holes in the epithelialization over the hematoma. I did not think any of this was going to be viable. I was hoping to keep this intact a little while longer however I do not think this is going to be the case. He required an extensive debridement silver alginate to the resultant  wound Electronic Signature(s) Signed: 08/12/2019 4:59:37 PM By: Linton Ham MD Entered By: Linton Ham on 08/12/2019 11:57:17 Brander, Olivia M. ([id number]) -------------------------------------------------------------------------------- Physical Exam Details Patient Name: Eduardo [name]. Date of Service: 08/12/2019 10:45 AM Medical Record Number: [id number] Patient Account Number: [id number] Date of Birth/Sex: 08/25/1930 (86 y.o. M) Treating RN: Cornell Barman Primary Care Provider: [name] Other Clinician: Referring Provider: [name] Treating Provider/Extender: Tito Dine in Treatment: 1 Constitutional Patient is hypertensive.. Pulse regular and within target range for patient.Marland Kitchen Respirations regular, non-labored and within target range.. Temperature is normal and within the target range for the patient.. No change in height.Marland Kitchen appears in no distress. Notes Wound exam; the area was a tense hematoma with 3 open areas in the surface epithelium. This obviously needed to be fully evacuated. I removed the skin with pickups and a #15 scalpel. And then an aggressive clot evacuation with curette. The wound was vigorously washed with Anasept and gauze. I did not think there was exposed deep structures no bone was palpable. No tendon. There is still some retained clot superior. Both brachial and radial pulses are palpable on the right Electronic Signature(s) Signed: 08/12/2019 4:59:37 PM By: Linton Ham MD Entered By: Linton Ham on 08/12/2019 12:05:04 Lamons, Raphael Jerilynn Mages ([id number]) -------------------------------------------------------------------------------- Physician Orders Details Patient Name: Eduardo [name]. Date of Service: 08/12/2019 10:45 AM Medical Record Number: [id number] Patient Account Number: [id number] Date of Birth/Sex: 1930/11/23 (86 y.o. M) Treating RN: Cornell Barman Primary Care Provider: [name],  Richard Other  Clinician: Referring Provider: [name] Treating Provider/Extender: Tito Dine in Treatment: 1 Verbal / Phone Orders: No Diagnosis Coding Wound Cleansing Wound #2 Right Forearm o Clean wound with Normal Saline. Anesthetic (add to Medication List) Wound #2 Right Forearm o Topical Lidocaine 4% cream applied to wound bed prior to debridement (In Clinic Only). Primary Wound Dressing Wound #2 Right Forearm o Silver Alginate Secondary Dressing Wound #2 Right Forearm o ABD and Kerlix/Conform - Ace wrap to secure Dressing Change Frequency Wound #2 Right Forearm o Change Dressing Monday, Wednesday, Friday Follow-up Appointments Wound #2 Right Forearm o Return Appointment in 1 week. o Nurse Visit as needed - Friday Additional Orders / Instructions Wound #2 Right Forearm o Activity as tolerated Home Health Wound #2 Right Forearm o [place] Visits - [place] Nurse may visit PRN to address patientos wound care needs. o FACE TO FACE ENCOUNTER: MEDICARE and MEDICAID PATIENTS: I certify that this patient is under my care and that I had a face-to- face encounter that meets the physician face-to-face encounter requirements with this patient on this date. The encounter with the patient was in whole or in part for the following MEDICAL CONDITION: (primary reason for [place]) MEDICAL NECESSITY: I certify, that based on my findings, NURSING services are a medically necessary home health service. HOME BOUND STATUS: I certify that my clinical findings support that this patient is homebound (i.e., Due to illness or injury, pt requires aid of supportive devices such as crutches, cane, wheelchairs, walkers, the use of special transportation or the assistance of another person to leave their place of residence. There is a normal inability to leave the home and doing so requires considerable and taxing effort. Other absences are  for medical reasons / religious services and are infrequent or of short duration when for other reasons). o If current dressing causes regression in wound condition, may D/C ordered dressing product/s and apply Normal Saline Moist Dressing daily until next [place] / Other MD appointment. [place] of regression in wound condition at [phone]. o Please direct any NON-WOUND related issues/requests for orders to patient's Primary Care Physician Electronic Signature(s) Signed: 08/12/2019 4:40:39 PM By: Gretta Cool, BSN, RN, CWS, Kim RN, BSN Signed: 08/12/2019 4:59:37 PM By: Linton Ham MD [place], Deontrey Jerilynn Mages ([id number]) Entered By: Gretta Cool, BSN, RN, CWS, Kim on 08/12/2019 11:17:12 Baldwin, Calla Kicks ([id number]) -------------------------------------------------------------------------------- Problem List Details Patient Name: JACE, FERMIN. Date of Service: 08/12/2019 10:45 AM Medical Record Number: [id number] Patient Account Number: [id number] Date of Birth/Sex: [birth year] (86 y.o. M) Treating RN: Cornell Barman Primary Care Provider: [name] Other Clinician: Referring Provider: [name] Treating Provider/Extender: Tito Dine in Treatment: 1 Active Problems ICD-10 Evaluated Encounter Code Description Active Date Today Diagnosis S40.021D Contusion of right upper arm, subsequent encounter 08/05/2019 No Yes T45.515S Adverse effect of anticoagulants, sequela 08/05/2019 No Yes Inactive Problems Resolved Problems Electronic Signature(s) Signed: 08/12/2019 4:59:37 PM By: Linton Ham MD Entered By: Linton Ham on 08/12/2019 11:51:12 [place], Franquez ([id number]) -------------------------------------------------------------------------------- Progress Note Details Patient Name: Eduardo [name]. Date of Service: 08/12/2019 10:45 AM Medical Record Number: [id number] Patient Account Number: [id number] Date of Birth/Sex:  19-Dec-1930 (86 y.o. M) Treating RN: Cornell Barman Primary Care Provider: [name] Other Clinician: Referring Provider: [name] Treating Provider/Extender: Tito Dine in Treatment: 1 Subjective History of Present Illness (HPI) 11/08/17 on evaluation today patient presents initially concerning a right dramatic lower extremity  ulcer/skin tear which occurred three weeks ago. He states that he initially was seen in urgent care where the area was sutured closed. The sutures stayed in for two weeks he tells me. Subsequently he was seen at Dr. Alla German office where the sutures were removed and Steri-Strips were placed over the next five days. Subsequently these were also removed and unfortunately at this time it was noted that the patient had a dehisced larger wound that required further attention. The patient and his wife actually requested referral to the wound care center in [place]. [name] made the referral to [place]. Subsequently the patient does have a history of atrial fibrillation, cardiovascular disease, COPD, stroke, and is on long-term Eliquis. There's no history of diabetes that he states. Subsequently he has no other major medical problems currently. No fevers, chills, nausea, or vomiting noted at this time. 11/14/17-He is seen in follow-up evaluation for right posterior lower extremity ulcer. There is improvement with granulation tissue present; debridement. We will continue with same treatment plan he'll follow-up next week. The vascular office did call to establish an appointment, but he forgot that we had referred him and did not make an appointment. He was encouraged to contact the office to schedule an appointment for evaluation. 11/22/17 on evaluation today patient actually appears to be doing well in regard to his ulcer. This has cleaned up very nicely in my pinion I do believe the Iodoflex has been of benefit for him. He actually does have his arterial study which is  scheduled for 11/27/17. In general he's having no significant discomfort mainly just with cleansing of the wound but the dressings themselves don't seem to be causing him problems. 11/29/17 on evaluation today patient appears to be doing very well in regard to his lower extremity ulcer. With that being said he unfortunately prior to coming in for evaluation today did have a trip over a world where he failed hurting his back and striking his head. With that being said he has not had any altered mental status since that time and states that he's not really any more dizzy and there does not appear to be any residual effect from this. He also does not have a significant headache. With that being said this is something I did check out for him today briefly at least since he was in our office. I do think that he needs to look out for any signs or symptoms of anything worsening such as nausea, vomiting, fever, chills, confusion, or severe headaches. He has no visual disturbance at this point. 12/06/17 on evaluation today patient appears to be doing excellent in regard to his lower extremity ulcer. This is starting to really fill in which is good news as well. We do have the results of the arterial study unfortunately they did not perform the TBI portion of the examination and his ABI's were noncompressible. With that being said they did state that he had triphasic blood flow throughout the bilateral lower extremities and there was apparently no evidence of arterial disease on the arterial ultrasound. Overall this is good news we will be able to initiate stronger compression therapy which should help to allow this wound to heal even faster as the patient still continues to have some pitting edema. 12/10/17 on evaluation today patient appears to be doing much better in regard to his lower extremity ulcer. He has been tolerating the dressing changes without complication. Fortunately the compression wrap seems to be  doing excellent at this time and  overall he is showing signs of good improvement. I'm happy with the overall progress that has been made over the past several weeks but especially in the past week. The patient and his wife both are extremely pleased. 12/18/17; right posterior calf ulcer. Healthy granulation. Dimension slightly smaller. Using silver collagen under compression 12/24/17 on evaluation today patient presents for follow-up concerning his ongoing right lower extremity ulcer. He has been tolerating the dressing changes without complication. Fortunately he seems to be doing excellent at this point. There does not appear to be any evidence of infection. 12/31/17 on evaluation today patient appears to be doing excellent in regard to his right lower extremity ulcer. He has been tolerating the dressing changes without complication. The wrap appears to have been very beneficial for him up to this point. With that being said he shows no signs of infection at this time and overall I'm very pleased with how things seem to be progressing. 01/07/18 on evaluation today patient actually appears to be doing excellent in regard to his ulcer. In fact there was a lot of dried dressing material noted covering the wound during evaluation today. Once I begin to remove this utilizing a curette I was noted that the patient actually had significant epithelialization underlying this event was just a very small region that appears to still be open otherwise the remainder appears to be closed. Obviously this is excellent news and I think he is progressing very nicely. 01/14/18-he is seen in follow-up evaluation. There is dried dressing which was removed to reveal an epithelialized wound. He will be discharged from services today [place] 08/05/2019 This is a somewhat frail 86 year old man accompanied by his wife. He was in this clinic in 2009 with a traumatic right lower extremity wound. He comes in this time having  suffered a fall on 07/07/2019 primary physician noted a hematoma and x-ray of the arm was negative. It was a bit difficult difficult to figure this out but looking through home health link however his wife says this area actually closed over and the hematoma resolved however he had a second fall on 07/29/2019 with a more substantial hematoma involving the lateral aspect of the right elbow a skin tear over this area and a substantial amount of bleeding in the posterior upper humerus area. Again an x-ray was negative. He has been applying Vaseline gauze twice daily with the help of his wife. The wound was debrided by primary care on 07/31/2019. BENZION, MESTA ([id number]) His wife states that she is overwhelmed with the idea of having to care for this wound and request home health. And is much as a can guarantee anything these days with home health we will try to get somebody out to help her with the dressing. The idea of stopping the Eliquis that he is on for chronic atrial fibrillation in an elderly man who falls has been brought up before and apparently was done once with the assistance of Dr. Tamala Julian his cardiologist in [place] however he apparently had a mini stroke shortly thereafter so he is back on Eliquis. He apparently has gait instability he has a walker Past medical history; atrial fibrillation on Eliquis COPD, history of cerebrovascular disease, hypertension, diastolic heart failure COPD and coronary artery disease 08/12/2019; this is a patient with a substantial hematoma over the lateral epicondyle of the right elbow. I left this intact last week and wrapped with compression however he arrives in clinic this week with 3 gaping holes in the epithelialization over  the hematoma. I did not think any of this was going to be viable. I was hoping to keep this intact a little while longer however I do not think this is going to be the case. He required an extensive debridement silver alginate to  the resultant wound Objective Constitutional Patient is hypertensive.. Pulse regular and within target range for patient.Marland Kitchen Respirations regular, non-labored and within target range.. Temperature is normal and within the target range for the patient.. No change in height.Marland Kitchen appears in no distress. Vitals Time Taken: 10:42 AM, Height: 62 in, Weight: 141 lbs, BMI: 25.8, Temperature: 97.8 F, Pulse: 73 bpm, Respiratory Rate: 16 breaths/min, Blood Pressure: 150/84 mmHg. General Notes: Wound exam; the area was a tense hematoma with 3 open areas in the surface epithelium. This obviously needed to be fully evacuated. I removed the skin with pickups and a #15 scalpel. And then an aggressive clot evacuation with curette. The wound was vigorously washed with Anasept and gauze. I did not think there was exposed deep structures no bone was palpable. No tendon. There is still some retained clot superior. Both brachial and radial pulses are palpable on the right Integumentary (Hair, Skin) Wound #2 status is Open. Original cause of wound was Trauma. The wound is located on the Right Forearm. The wound measures 4.4cm length x 4cm width x 0.1cm depth; 13.823cm^2 area and 1.382cm^3 volume. There is Fat Layer (Subcutaneous Tissue) Exposed exposed. There is no tunneling or undermining noted. There is a medium amount of serosanguineous drainage noted. The wound margin is flat and intact. There is large (67-100%) red granulation within the wound bed. There is a small (1-33%) amount of necrotic tissue within the wound bed including Adherent Slough. Assessment Active Problems ICD-10 Contusion of right upper arm, subsequent encounter Adverse effect of anticoagulants, sequela Procedures Wound #2 Pre-procedure diagnosis of Wound #2 is a Skin Tear located on the Right Forearm . There was a Excisional Skin/Subcutaneous Tissue Debridement with a total area of 17.6 sq cm performed by Ricard Dillon, MD. With the  following instrument(s): Blade, Curette, Forceps, and Scissors to remove Viable and Non-Viable tissue/material. Material removed includes Blood Clots, Subcutaneous Tissue, and Skin: Epidermis after achieving pain control using Lidocaine. No specimens were taken. A time out was conducted at 10:59, prior to the start of the procedure. A Large amount of bleeding was controlled with Silver Nitrate. The procedure was tolerated well. Post Debridement Measurements: 4.4cm length x 4cm width x 1.6cm depth; [place], Malikah M. ([id number]) 22.117cm^3 volume. Character of Wound/Ulcer Post Debridement is stable. Post procedure Diagnosis Wound #2: Same as Pre-Procedure Plan Wound Cleansing: Wound #2 Right Forearm: Clean wound with Normal Saline. Anesthetic (add to Medication List): Wound #2 Right Forearm: Topical Lidocaine 4% cream applied to wound bed prior to debridement (In Clinic Only). Primary Wound Dressing: Wound #2 Right Forearm: Silver Alginate Secondary Dressing: Wound #2 Right Forearm: ABD and Kerlix/Conform - Ace wrap to secure Dressing Change Frequency: Wound #2 Right Forearm: Change Dressing Monday, Wednesday, Friday Follow-up Appointments: Wound #2 Right Forearm: Return Appointment in 1 week. Nurse Visit as needed - Friday Additional Orders / Instructions: Wound #2 Right Forearm: Activity as tolerated Home Health: Wound #2 Right Forearm: Continue Home Health Visits - [place] Nurse may visit PRN to address patient s wound care needs. FACE TO FACE ENCOUNTER: MEDICARE and MEDICAID PATIENTS: I certify that this patient is under my care and that I had a face-to-face encounter that meets the physician face-to-face encounter requirements  with this patient on this date. The encounter with the patient was in whole or in part for the following MEDICAL CONDITION: (primary reason for [place]) MEDICAL NECESSITY: I certify, that based on my findings, NURSING services  are a medically necessary home health service. HOME BOUND STATUS: I certify that my clinical findings support that this patient is homebound (i.e., Due to illness or injury, pt requires aid of supportive devices such as crutches, cane, wheelchairs, walkers, the use of special transportation or the assistance of another person to leave their place of residence. There is a normal inability to leave the home and doing so requires considerable and taxing effort. Other absences are for medical reasons / religious services and are infrequent or of short duration when for other reasons). If current dressing causes regression in wound condition, may D/C ordered dressing product/s and apply Normal Saline Moist Dressing daily until next [place] / Other MD appointment. [place] of regression in wound condition at [phone]. Please direct any NON-WOUND related issues/requests for orders to patient's Primary Care Physician 1. The wound was dressed with silver alginate, gauze, ABD Kerlix with Ace wrap to secure 2. This will need to be changed either with home health twice a week and once here. His wife literally begged me to bring him back in in 2 days and change it here before moving to home health which is [facility]. I consented 3. The patient's wife apparently had a conversation with his cardiologist about the merits of Eliquis. From a wound point of view this is a material however I was concerned about the frequency of his falls vis--vis a serious soft tissue or even intracranial bleed. They have home health physical therapy out doing a gait and balance assessment. I think this is a good idea 4. I was hoping that this hematoma would stay intact a little longer however that was not to be the case. This is going to be a long arduous process to get this area to close. Silver alginate this week may be changing to silver collagen. 5. He does not appear to have a vascular  issue Electronic Signature(s) Signed: 08/12/2019 4:59:37 PM By: Linton Ham MD Entered By: Linton Ham on 08/12/2019 12:09:47 Rettinger, Rose Jerilynn Mages ([id number]) -------------------------------------------------------------------------------- [place] Details Patient Name: Eduardo [name]. Date of Service: 08/12/2019 Medical Record Number: [id number] Patient Account Number: [id number] Date of Birth/Sex: [birth year] (86 y.o. M) Treating RN: Cornell Barman Primary Care Provider: [name] Other Clinician: Referring Provider: [name] Treating Provider/Extender: Tito Dine in Treatment: 1 Diagnosis Coding ICD-10 Codes Code Description S40.021D Contusion of right upper arm, subsequent encounter T45.515S Adverse effect of anticoagulants, sequela Facility Procedures CPT4 Code: 44034742 Description: 59563 - DEB SUBQ TISSUE 20 SQ CM/< Modifier: Quantity: 1 CPT4 Code: Description: ICD-10 Diagnosis Description S40.021D Contusion of right upper arm, subsequent encounter Modifier: Quantity: Physician Procedures CPT4 Code: 8756433 Description: 29518 - WC PHYS SUBQ TISS 20 SQ CM Modifier: Quantity: 1 CPT4 Code: Description: ICD-10 Diagnosis Description S40.021D Contusion of right upper arm, subsequent encounter Modifier: Quantity: Electronic Signature(s) Signed: 08/12/2019 4:59:37 PM By: Linton Ham MD Entered By: Linton Ham on 08/12/2019 12:10:03

## 2019-08-14 ENCOUNTER — Other Ambulatory Visit: Payer: Self-pay

## 2019-08-14 DIAGNOSIS — S40021D Contusion of right upper arm, subsequent encounter: Secondary | ICD-10-CM | POA: Diagnosis not present

## 2019-08-14 NOTE — Progress Notes (Signed)
COPELAND, NEISEN ([id number]) Visit Report for 08/14/2019 Arrival Information Details Patient Name: Eduardo [name], Eduardo [name]. Date of Service: 08/14/2019 8:00 AM Medical Record Number: [id number] Patient Account Number: [id number] Date of Birth/Sex: August 13, 1930 (86 y.o. M) Treating [name]: Eduardo [name] Primary Care Eduardo [name]: Eduardo [name] Other Clinician: Referring Eduardo [name]: Eduardo [name] Treating Eduardo [name]/Extender: Eduardo [name], Eduardo [name]: 1 Visit Information History Since Last Visit Has Dressing in Place as Prescribed: Yes Patient Arrived: [place] Pain Present Now: No Arrival Time: 08:03 Accompanied By: wife Transfer Assistance: None Patient Identification Verified: Yes Secondary Verification Process Completed: Yes Electronic Signature(s) Signed: 08/14/2019 5:14:53 PM By: Eduardo [name], BSN Entered By: Eduardo [name], BSN, [name], CWS, Kim on 08/14/2019 08:03:38 Kmetz, Eduardo [name] ([id number]) -------------------------------------------------------------------------------- Clinic Level of Care Assessment Details Patient Name: Eduardo [name], Eduardo [name]. Date of Service: 08/14/2019 8:00 AM Medical Record Number: [id number] Patient Account Number: [id number] Date of Birth/Sex: [birth year] (86 y.o. M) Treating [name]: Eduardo [name] Primary Care Colman Birdwell: Eduardo [name] Other Clinician: Referring Christne Platts: Eduardo [name] Treating Lerlene Treadwell/Extender: Eduardo [name], Eduardo [name]: 1 Clinic Level of Care Assessment Items TOOL 4 Quantity Score []  - Use when only an EandM is performed on FOLLOW-UP visit 0 ASSESSMENTS - Nursing Assessment / Reassessment []  - Reassessment of Co-morbidities (includes updates in patient status) 0 X- 1 5 Reassessment of Adherence to [name] Plan ASSESSMENTS - Wound and Skin Assessment / Reassessment X - Simple Wound Assessment / Reassessment - one wound 1 5 []  - 0 Complex Wound Assessment / Reassessment - multiple wounds []  - 0 Dermatologic / Skin  Assessment (not related to wound area) ASSESSMENTS - Focused Assessment []  - Circumferential Edema Measurements - multi extremities 0 []  - 0 Nutritional Assessment / Counseling / Intervention []  - 0 Lower Extremity Assessment (monofilament, tuning fork, pulses) []  - 0 Peripheral Arterial Disease Assessment (using hand held doppler) ASSESSMENTS - Ostomy and/or Continence Assessment and Care []  - Incontinence Assessment and Management 0 []  - 0 Ostomy Care Assessment and Management (repouching, etc.) PROCESS - Coordination of Care X - Simple Patient / Family Education for ongoing care 1 15 []  - 0 Complex (extensive) Patient / Family Education for ongoing care []  - 0 Staff obtains Programmer, systems, Records, Test Results / Process Orders []  - 0 Staff telephones HHA, Nursing Homes / Clarify orders / etc []  - 0 Routine Transfer to another Facility (non-emergent condition) []  - 0 Routine Hospital Admission (non-emergent condition) []  - 0 New Admissions / Biomedical engineer / Ordering NPWT, Apligraf, etc. []  - 0 Emergency Hospital Admission (emergent condition) X- 1 10 Simple Discharge Coordination []  - 0 Complex (extensive) Discharge Coordination PROCESS - Special Needs []  - Pediatric / Minor Patient Management 0 []  - 0 Isolation Patient Management []  - 0 Hearing / Language / Visual special needs []  - 0 Assessment of Community assistance (transportation, D/C planning, etc.) Kelty, Trason M. ([id number]) []  - 0 Additional assistance / Altered mentation []  - 0 Support Surface(s) Assessment (bed, cushion, seat, etc.) INTERVENTIONS - Wound Cleansing / Measurement X - Simple Wound Cleansing - one wound 1 5 []  - 0 Complex Wound Cleansing - multiple wounds X- 1 5 Wound Imaging (photographs - any number of wounds) []  - 0 Wound Tracing (instead of photographs) []  - 0 Simple Wound Measurement - one wound []  - 0 Complex Wound Measurement - multiple wounds INTERVENTIONS - Wound  Dressings []  - Small Wound Dressing one or multiple wounds 0 []  - 0 Medium Wound Dressing one or multiple wounds X-  1 20 Large Wound Dressing one or multiple wounds []  - 0 Application of Medications - topical []  - 0 Application of Medications - injection INTERVENTIONS - Miscellaneous []  - External ear exam 0 []  - 0 Specimen Collection (cultures, biopsies, blood, body fluids, etc.) []  - 0 Specimen(s) / Culture(s) sent or taken to Lab for analysis []  - 0 Patient Transfer (multiple staff / Civil Service fast streamer / Similar devices) []  - 0 Simple Staple / Suture removal (25 or less) []  - 0 Complex Staple / Suture removal (26 or more) []  - 0 Hypo / Hyperglycemic Management (close monitor of Blood Glucose) []  - 0 Ankle / Brachial Index (ABI) - do not check if billed separately []  - 0 Vital Signs Has the patient been seen at the hospital within the last three years: Yes Total Score: 65 Level Of Care: New/Established - Level 2 Electronic Signature(s) Signed: 08/14/2019 5:14:53 PM By: Eduardo [name], BSN Entered By: Eduardo [name], BSN, [name], CWS, Kim on 08/14/2019 08:10:42 Eduardo [name], Eduardo [name] ([id number]) -------------------------------------------------------------------------------- Encounter Discharge Information Details Patient Name: Eduardo [name]. Date of Service: 08/14/2019 8:00 AM Medical Record Number: [id number] Patient Account Number: [id number] Date of Birth/Sex: June 21, 1930 (86 y.o. M) Treating [name]: Eduardo [name] Primary Care Aurea Aronov: Eduardo [name] Other Clinician: Referring Vedanth Sirico: Eduardo [name] Treating Leidi Astle/Extender: Eduardo [name], Eduardo [name]: 1 Encounter Discharge Information Items Discharge Condition: Stable Ambulatory Status: Ambulatory Discharge Destination: Home Transportation: Private Auto Accompanied By: wife Schedule Follow-up Appointment: Yes Clinical Summary of Care: Electronic Signature(s) Signed: 08/14/2019 5:14:53 PM By: Eduardo [name], BSN, [name],  CWS, Kim [name], BSN Entered By: Eduardo [name], BSN, [name], CWS, Kim on 08/14/2019 08:09:59 Eduardo [name], Eduardo [name] ([id number]) -------------------------------------------------------------------------------- Wound Assessment Details Patient Name: Eduardo [name], Eduardo [name]. Date of Service: 08/14/2019 8:00 AM Medical Record Number: [id number] Patient Account Number: [id number] Date of Birth/Sex: [birth year] (86 y.o. M) Treating [name]: Eduardo [name] Primary Care Dannette Kinkaid: Eduardo [name] Other Clinician: Referring Chester Sibert: Eduardo [name] Treating Sharilynn Cassity/Extender: Eduardo [name], Eduardo [name]: 1 Wound Status Wound Number: 2 Primary Etiology: Skin Tear Wound Location: Right Forearm Wound Status: Open Wounding Event: Trauma Date Acquired: 07/29/2019 Weeks Of [name]: 1 Clustered Wound: No Wound Measurements Length: (cm) 4 Width: (cm) 4 Depth: (cm) 0.1 Area: (cm) 12.566 Volume: (cm) 1.257 % Reduction in Area: 30.7% % Reduction in Volume: 30.7% Wound Description Classification: Full Thickness Without Exposed Support Structure s Electronic Signature(s) Signed: 08/14/2019 5:14:53 PM By: Eduardo [name], BSN Entered By: Eduardo [name], BSN, [name], CWS, Kim on 08/14/2019 92:95:74

## 2019-08-17 DIAGNOSIS — J449 Chronic obstructive pulmonary disease, unspecified: Secondary | ICD-10-CM | POA: Diagnosis not present

## 2019-08-17 DIAGNOSIS — Z87891 Personal history of nicotine dependence: Secondary | ICD-10-CM | POA: Diagnosis not present

## 2019-08-17 DIAGNOSIS — Z9181 History of falling: Secondary | ICD-10-CM | POA: Diagnosis not present

## 2019-08-17 DIAGNOSIS — Z7901 Long term (current) use of anticoagulants: Secondary | ICD-10-CM | POA: Diagnosis not present

## 2019-08-17 DIAGNOSIS — F39 Unspecified mood [affective] disorder: Secondary | ICD-10-CM | POA: Diagnosis not present

## 2019-08-17 DIAGNOSIS — I252 Old myocardial infarction: Secondary | ICD-10-CM | POA: Diagnosis not present

## 2019-08-17 DIAGNOSIS — S41101D Unspecified open wound of right upper arm, subsequent encounter: Secondary | ICD-10-CM | POA: Diagnosis not present

## 2019-08-17 DIAGNOSIS — I482 Chronic atrial fibrillation, unspecified: Secondary | ICD-10-CM | POA: Diagnosis not present

## 2019-08-17 DIAGNOSIS — M48061 Spinal stenosis, lumbar region without neurogenic claudication: Secondary | ICD-10-CM | POA: Diagnosis not present

## 2019-08-17 DIAGNOSIS — I110 Hypertensive heart disease with heart failure: Secondary | ICD-10-CM | POA: Diagnosis not present

## 2019-08-17 DIAGNOSIS — G8911 Acute pain due to trauma: Secondary | ICD-10-CM | POA: Diagnosis not present

## 2019-08-17 DIAGNOSIS — I051 Rheumatic mitral insufficiency: Secondary | ICD-10-CM | POA: Diagnosis not present

## 2019-08-17 DIAGNOSIS — K219 Gastro-esophageal reflux disease without esophagitis: Secondary | ICD-10-CM | POA: Diagnosis not present

## 2019-08-17 DIAGNOSIS — I5032 Chronic diastolic (congestive) heart failure: Secondary | ICD-10-CM | POA: Diagnosis not present

## 2019-08-17 DIAGNOSIS — R0781 Pleurodynia: Secondary | ICD-10-CM | POA: Diagnosis not present

## 2019-08-17 DIAGNOSIS — M159 Polyosteoarthritis, unspecified: Secondary | ICD-10-CM | POA: Diagnosis not present

## 2019-08-17 DIAGNOSIS — I2510 Atherosclerotic heart disease of native coronary artery without angina pectoris: Secondary | ICD-10-CM | POA: Diagnosis not present

## 2019-08-17 DIAGNOSIS — I6523 Occlusion and stenosis of bilateral carotid arteries: Secondary | ICD-10-CM | POA: Diagnosis not present

## 2019-08-18 ENCOUNTER — Encounter: Payer: PPO | Admitting: Physician Assistant

## 2019-08-18 ENCOUNTER — Other Ambulatory Visit: Payer: Self-pay

## 2019-08-18 DIAGNOSIS — S51011A Laceration without foreign body of right elbow, initial encounter: Secondary | ICD-10-CM | POA: Diagnosis not present

## 2019-08-18 DIAGNOSIS — M159 Polyosteoarthritis, unspecified: Secondary | ICD-10-CM | POA: Diagnosis not present

## 2019-08-18 DIAGNOSIS — Z87891 Personal history of nicotine dependence: Secondary | ICD-10-CM | POA: Diagnosis not present

## 2019-08-18 DIAGNOSIS — I252 Old myocardial infarction: Secondary | ICD-10-CM | POA: Diagnosis not present

## 2019-08-18 DIAGNOSIS — J449 Chronic obstructive pulmonary disease, unspecified: Secondary | ICD-10-CM | POA: Diagnosis not present

## 2019-08-18 DIAGNOSIS — K219 Gastro-esophageal reflux disease without esophagitis: Secondary | ICD-10-CM | POA: Diagnosis not present

## 2019-08-18 DIAGNOSIS — I110 Hypertensive heart disease with heart failure: Secondary | ICD-10-CM | POA: Diagnosis not present

## 2019-08-18 DIAGNOSIS — F39 Unspecified mood [affective] disorder: Secondary | ICD-10-CM | POA: Diagnosis not present

## 2019-08-18 DIAGNOSIS — I5032 Chronic diastolic (congestive) heart failure: Secondary | ICD-10-CM | POA: Diagnosis not present

## 2019-08-18 DIAGNOSIS — G8911 Acute pain due to trauma: Secondary | ICD-10-CM | POA: Diagnosis not present

## 2019-08-18 DIAGNOSIS — R0781 Pleurodynia: Secondary | ICD-10-CM | POA: Diagnosis not present

## 2019-08-18 DIAGNOSIS — I051 Rheumatic mitral insufficiency: Secondary | ICD-10-CM | POA: Diagnosis not present

## 2019-08-18 DIAGNOSIS — I2510 Atherosclerotic heart disease of native coronary artery without angina pectoris: Secondary | ICD-10-CM | POA: Diagnosis not present

## 2019-08-18 DIAGNOSIS — M48061 Spinal stenosis, lumbar region without neurogenic claudication: Secondary | ICD-10-CM | POA: Diagnosis not present

## 2019-08-18 DIAGNOSIS — Z7901 Long term (current) use of anticoagulants: Secondary | ICD-10-CM | POA: Diagnosis not present

## 2019-08-18 DIAGNOSIS — S40021D Contusion of right upper arm, subsequent encounter: Secondary | ICD-10-CM | POA: Diagnosis not present

## 2019-08-18 DIAGNOSIS — S41101D Unspecified open wound of right upper arm, subsequent encounter: Secondary | ICD-10-CM | POA: Diagnosis not present

## 2019-08-18 DIAGNOSIS — I6523 Occlusion and stenosis of bilateral carotid arteries: Secondary | ICD-10-CM | POA: Diagnosis not present

## 2019-08-18 DIAGNOSIS — Z9181 History of falling: Secondary | ICD-10-CM | POA: Diagnosis not present

## 2019-08-18 DIAGNOSIS — I482 Chronic atrial fibrillation, unspecified: Secondary | ICD-10-CM | POA: Diagnosis not present

## 2019-08-18 NOTE — Progress Notes (Addendum)
TREYDON, HENRICKS ([id number]) Visit Report for 08/18/2019 Chief Complaint Document Details Patient Name: Eduardo [name], Eduardo [name]. Date of Service: 08/18/2019 3:45 PM Medical Record Number: [id number] Patient Account Number: [id number] Date of Birth/Sex: 06-Nov-1930 (86 y.o. M) Treating RN: [name] Primary Care Provider: [name] Other Clinician: Referring Provider: [name] Treating Provider/Extender: Melburn Hake, Sidonia Nutter Weeks in Treatment: 1 Information Obtained from: Patient Chief Complaint Right lower leg ulcer due to trauma 08/05/2019; patient is here for review of a hematoma and skin tear on the lateral part of the right elbow Electronic Signature(s) Signed: 08/18/2019 3:49:05 PM By: Worthy Keeler PA-C Entered By: Worthy Keeler on 08/18/2019 15:49:05 Meggett, Keithon M. ([id number]) -------------------------------------------------------------------------------- Debridement Details Patient Name: Eduardo [name]. Date of Service: 08/18/2019 3:45 PM Medical Record Number: [id number] Patient Account Number: [id number] Date of Birth/Sex: 07/16/1930 (86 y.o. M) Treating RN: [name] Primary Care Provider: [name] Other Clinician: Referring Provider: [name] Treating Provider/Extender: Melburn Hake, Quintan Saldivar Weeks in Treatment: 1 Debridement Performed for Wound #2 Right Elbow Assessment: Performed By: Physician STONE III, Gurnie Duris E., PA-C Debridement Type: Debridement Level of Consciousness (Pre- Awake and Alert procedure): Pre-procedure Verification/Time Out Yes - 16:05 Taken: Start Time: 16:05 Pain Control: Lidocaine 4% Topical Solution Total Area Debrided (L x W): 3.2 (cm) x 2.2 (cm) = 7.04 (cm) Tissue and other material debrided: Viable, Non-Viable, Blood Clots, Slough, Subcutaneous, Slough Level: Skin/Subcutaneous Tissue Debridement Description: Excisional Instrument: Curette Bleeding: Minimum Hemostasis Achieved: Silver Nitrate End  Time: 16:12 Procedural Pain: 0 Post Procedural Pain: 0 Response to Treatment: Procedure was tolerated well Level of Consciousness (Post- Awake and Alert procedure): Post Debridement Measurements of Total Wound Length: (cm) 3.2 Width: (cm) 2.2 Depth: (cm) 0.9 Volume: (cm) 4.976 Character of Wound/Ulcer Post Debridement: Improved Post Procedure Diagnosis Same as Pre-procedure Electronic Signature(s) Signed: 08/18/2019 4:36:26 PM By: [name] Signed: 08/18/2019 4:47:23 PM By: Worthy Keeler PA-C Entered By: [name] on 08/18/2019 16:16:53 Eduardo [name], [name] ([id number]) -------------------------------------------------------------------------------- HPI Details Patient Name: Eduardo [name]. Date of Service: 08/18/2019 3:45 PM Medical Record Number: [id number] Patient Account Number: [id number] Date of Birth/Sex: Jul 21, 1930 (86 y.o. M) Treating RN: [name] Primary Care Provider: [name] Other Clinician: Referring Provider: [name] Treating Provider/Extender: Melburn Hake, Makenly Larabee Weeks in Treatment: 1 History of Present Illness HPI Description: 11/08/17 on evaluation today patient presents initially concerning a right dramatic lower extremity ulcer/skin tear which occurred three weeks ago. He states that he initially was seen in urgent care where the area was sutured closed. The sutures stayed in for two weeks he tells me. Subsequently he was seen at Dr. Alla German office where the sutures were removed and Steri-Strips were placed over the next five days. Subsequently these were also removed and unfortunately at this time it was noted that the patient had a dehisced larger wound that required further attention. The patient and his wife actually requested referral to the wound care center in [place]. Silvio [name] made the referral to [place]. Subsequently the patient does have a history of atrial fibrillation, cardiovascular disease, COPD, stroke, and is on long-term  Eliquis. There's no history of diabetes that he states. Subsequently he has no other major medical problems currently. No fevers, chills, nausea, or vomiting noted at this time. 11/14/17-He is seen in follow-up evaluation for right posterior lower extremity ulcer. There is improvement with granulation tissue present; debridement. We will continue with same treatment plan he'll follow-up next week. The vascular office did call to establish an appointment, but  he forgot that we had referred him and did not make an appointment. He was encouraged to contact the office to schedule an appointment for evaluation. 11/22/17 on evaluation today patient actually appears to be doing well in regard to his ulcer. This has cleaned up very nicely in my pinion I do believe the Iodoflex has been of benefit for him. He actually does have his arterial study which is scheduled for 11/27/17. In general he's having no significant discomfort mainly just with cleansing of the wound but the dressings themselves don't seem to be causing him problems. 11/29/17 on evaluation today patient appears to be doing very well in regard to his lower extremity ulcer. With that being said he unfortunately prior to coming in for evaluation today did have a trip over a world where he failed hurting his back and striking his head. With that being said he has not had any altered mental status since that time and states that he's not really any more dizzy and there does not appear to be any residual effect from this. He also does not have a significant headache. With that being said this is something I did check out for him today briefly at least since he was in our office. I do think that he needs to look out for any signs or symptoms of anything worsening such as nausea, vomiting, fever, chills, confusion, or severe headaches. He has no visual disturbance at this point. 12/06/17 on evaluation today patient appears to be doing excellent in regard to  his lower extremity ulcer. This is starting to really fill in which is good news as well. We do have the results of the arterial study unfortunately they did not perform the TBI portion of the examination and his ABI's were noncompressible. With that being said they did state that he had triphasic blood flow throughout the bilateral lower extremities and there was apparently no evidence of arterial disease on the arterial ultrasound. Overall this is good news we will be able to initiate stronger compression therapy which should help to allow this wound to heal even faster as the patient still continues to have some pitting edema. 12/10/17 on evaluation today patient appears to be doing much better in regard to his lower extremity ulcer. He has been tolerating the dressing changes without complication. Fortunately the compression wrap seems to be doing excellent at this time and overall he is showing signs of good improvement. I'm happy with the overall progress that has been made over the past several weeks but especially in the past week. The patient and his wife both are extremely pleased. 12/18/17; right posterior calf ulcer. Healthy granulation. Dimension slightly smaller. Using silver collagen under compression 12/24/17 on evaluation today patient presents for follow-up concerning his ongoing right lower extremity ulcer. He has been tolerating the dressing changes without complication. Fortunately he seems to be doing excellent at this point. There does not appear to be any evidence of infection. 12/31/17 on evaluation today patient appears to be doing excellent in regard to his right lower extremity ulcer. He has been tolerating the dressing changes without complication. The wrap appears to have been very beneficial for him up to this point. With that being said he shows no signs of infection at this time and overall I'm very pleased with how things seem to be progressing. 01/07/18 on evaluation today  patient actually appears to be doing excellent in regard to his ulcer. In fact there was a lot of dried dressing  material noted covering the wound during evaluation today. Once I begin to remove this utilizing a curette I was noted that the patient actually had significant epithelialization underlying this event was just a very small region that appears to still be open otherwise the remainder appears to be closed. Obviously this is excellent news and I think he is progressing very nicely. 01/14/18-he is seen in follow-up evaluation. There is dried dressing which was removed to reveal an epithelialized wound. He will be discharged from services today Laie 08/05/2019 This is a somewhat frail 86 year old man accompanied by his wife. He was in this clinic in 2009 with a traumatic right lower extremity wound. He comes in this time having suffered a fall on 07/07/2019 primary physician noted a hematoma and x-ray of the arm was negative. It was a bit difficult difficult to figure this out but looking through home health link however his wife says this area actually closed over and the hematoma resolved however he had a second fall on 07/29/2019 with a more substantial hematoma involving the lateral aspect of the right elbow a skin tear over this area and a substantial amount of bleeding in the posterior upper humerus area. Again an x-ray was negative. He has been applying Vaseline gauze twice daily with the help of his wife. The wound was debrided by primary care on 07/31/2019. Eduardo [name], Eduardo [name] ([id number]) His wife states that she is overwhelmed with the idea of having to care for this wound and request home health. And is much as a can guarantee anything these days with home health we will try to get somebody out to help her with the dressing. The idea of stopping the Eliquis that he is on for chronic atrial fibrillation in an elderly man who falls has been brought up before and apparently was done  once with the assistance of Dr. Tamala Julian his cardiologist in [place] however he apparently had a mini stroke shortly thereafter so he is back on Eliquis. He apparently has gait instability he has a walker Past medical history; atrial fibrillation on Eliquis COPD, history of cerebrovascular disease, hypertension, diastolic heart failure COPD and coronary artery disease 08/12/2019; this is a patient with a substantial hematoma over the lateral epicondyle of the right elbow. I left this intact last week and wrapped with compression however he arrives in clinic this week with 3 gaping holes in the epithelialization over the hematoma. I did not think any of this was going to be viable. I was hoping to keep this intact a little while longer however I do not think this is going to be the case. He required an extensive debridement silver alginate to the resultant wound 08/18/2019 upon evaluation today patient is seen by myself for the first time today regarding the hematoma and injury on his right elbow location where he has a skin tear as well. Unfortunately he still has some hematoma remaining in the proximal portion of the wound. Fortunately there is no signs of active infection at this time. No fevers, chills, nausea, vomiting, or diarrhea. With that being said he has not had any additional falls which is good news and overall it does look like this is getting better compared to what it was when he first came back into the clinic. There has been some concern over whether or not he should continue with the blood thinners secondary to the fact that again he keeps having falls and again this could be detrimental if he were to strike his  head. Nonetheless I am not sure there is been a consensus with his cardiologist yet as to whether or not the blood thinner should be stopped. Electronic Signature(s) Signed: 08/18/2019 4:43:13 PM By: Worthy Keeler PA-C Previous Signature: 08/18/2019 3:50:24 PM Version By: Worthy Keeler PA-C Entered By: Worthy Keeler on 08/18/2019 16:43:13 Eduardo [name], [name] ([id number]) -------------------------------------------------------------------------------- Physical Exam Details Patient Name: Eduardo [name]. Date of Service: 08/18/2019 3:45 PM Medical Record Number: [id number] Patient Account Number: [id number] Date of Birth/Sex: 1931-01-04 (86 y.o. M) Treating RN: [name] Primary Care Provider: [name] Other Clinician: Referring Provider: [name] Treating Provider/Extender: STONE III, Chief Walkup Weeks in Treatment: 1 Constitutional Well-nourished and well-hydrated in no acute distress. Respiratory normal breathing without difficulty. Psychiatric this patient is able to make decisions and demonstrates good insight into disease process. Alert and Oriented x 3. pleasant and cooperative. Notes Patient's wound bed currently did require some sharp debridement to remove slough, congealed blood, and necrotic tissue from the surface of the wound. He tolerated debridement today with minimal discomfort the good news is post debridement wound bed appears to be doing much better. Still there is a long ways to go to get this area to heal appropriately. Electronic Signature(s) Signed: 08/18/2019 4:43:59 PM By: Worthy Keeler PA-C Previous Signature: 08/18/2019 3:52:51 PM Version By: Worthy Keeler PA-C Previous Signature: 08/18/2019 3:51:23 PM Version By: Worthy Keeler PA-C Entered By: Worthy Keeler on 08/18/2019 16:43:46 Eduardo [name], [name] ([id number]) -------------------------------------------------------------------------------- Physician Orders Details Patient Name: Eduardo [name]. Date of Service: 08/18/2019 3:45 PM Medical Record Number: [id number] Patient Account Number: [id number] Date of Birth/Sex: 12-26-1930 (86 y.o. M) Treating RN: [name] Primary Care Provider: [name] Other Clinician: Referring Provider: [name] Treating Provider/Extender: Melburn Hake, Kareen Jefferys Weeks in Treatment: 1 Verbal / Phone Orders: No Diagnosis Coding ICD-10 Coding Code Description S40.021D Contusion of right upper arm, subsequent encounter T45.515S Adverse effect of anticoagulants, sequela Wound Cleansing Wound #2 Right Elbow o Clean wound with Normal Saline. Anesthetic (add to Medication List) Wound #2 Right Elbow o Topical Lidocaine 4% cream applied to wound bed prior to debridement (In Clinic Only). Primary Wound Dressing Wound #2 Right Elbow o Silver Alginate Secondary Dressing Wound #2 Right Elbow o ABD and Kerlix/Conform - Ace wrap to secure Dressing Change Frequency Wound #2 Right Elbow o Change Dressing Monday, Wednesday, Friday Follow-up Appointments Wound #2 Right Elbow o Return Appointment in 1 week. Additional Orders / Instructions Wound #2 Right Elbow o Activity as tolerated Home Health Wound #2 Right Elbow o [place] [facility]its - [facility] Nurse may visit PRN to address patientos wound care needs. o FACE TO FACE ENCOUNTER: MEDICARE and MEDICAID PATIENTS: I certify that this patient is under my care and that I had a face-to- face encounter that meets the physician face-to-face encounter requirements with this patient on this date. The encounter with the patient was in whole or in part for the following MEDICAL CONDITION: (primary reason for [place]) MEDICAL NECESSITY: I certify, that based on my findings, NURSING services are a medically necessary home health service. HOME BOUND STATUS: I certify that my clinical findings support that this patient is homebound (i.e., Due to illness or injury, pt requires aid of supportive devices such as crutches, cane, wheelchairs, walkers, the use of special transportation or the assistance of another person to leave their place of residence. There is a normal inability to leave the home and doing so  requires  considerable and taxing effort. Other absences are for medical reasons / religious services and are infrequent or of short duration when for other reasons). o If current dressing causes regression in wound condition, may D/C ordered dressing product/s and apply Normal Saline Moist Dressing daily until next [place] / Other MD appointment. [place] of regression in wound condition at [phone]. o Please direct any NON-WOUND related issues/requests for orders to patient's Primary Care Physician Eduardo [name], Eduardo [name] ([id number]) Electronic Signature(s) Signed: 08/18/2019 4:36:26 PM By: [name] Signed: 08/18/2019 4:47:23 PM By: Worthy Keeler PA-C Entered By: [name] on 08/18/2019 16:12:29 Eduardo [name], [name] ([id number]) -------------------------------------------------------------------------------- Problem List Details Patient Name: Eduardo [name], Eduardo [name]. Date of Service: 08/18/2019 3:45 PM Medical Record Number: [id number] Patient Account Number: [id number] Date of Birth/Sex: 1930/09/09 (86 y.o. M) Treating RN: [name] Primary Care Provider: [name] Other Clinician: Referring Provider: [name] Treating Provider/Extender: Melburn Hake, [place] Weeks in Treatment: 1 Active Problems ICD-10 Evaluated Encounter Code Description Active Date Today Diagnosis S40.021D Contusion of right upper arm, subsequent encounter 08/05/2019 No Yes T45.515S Adverse effect of anticoagulants, sequela 08/05/2019 No Yes Inactive Problems Resolved Problems Electronic Signature(s) Signed: 08/18/2019 3:48:28 PM By: Worthy Keeler PA-C Entered By: Worthy Keeler on 08/18/2019 15:48:28 Yzaguirre, Keilan M. ([id number]) -------------------------------------------------------------------------------- Progress Note Details Patient Name: Eduardo [name]. Date of Service: 08/18/2019 3:45 PM Medical Record Number: [id number] Patient Account Number:  [id number] Date of Birth/Sex: [birth year] (86 y.o. M) Treating RN: [name] Primary Care Provider: [name] Other Clinician: Referring Provider: [name] Treating Provider/Extender: Melburn Hake, [place] Weeks in Treatment: 1 Subjective Chief Complaint Information obtained from Patient Right lower leg ulcer due to trauma 08/05/2019; patient is here for review of a hematoma and skin tear on the lateral part of the right elbow History of Present Illness (HPI) 11/08/17 on evaluation today patient presents initially concerning a right dramatic lower extremity ulcer/skin tear which occurred three weeks ago. He states that he initially was seen in urgent care where the area was sutured closed. The sutures stayed in for two weeks he tells me. Subsequently he was seen at Dr. Alla German office where the sutures were removed and Steri-Strips were placed over the next five days. Subsequently these were also removed and unfortunately at this time it was noted that the patient had a dehisced larger wound that required further attention. The patient and his wife actually requested referral to the wound care center in Dr. Silvio [name] made the referral to [place]. Subsequently the patient does have a history of atrial fibrillation, cardiovascular disease, COPD, stroke, and is on long-term Eliquis. There's no history of diabetes that he states. Subsequently he has no other major medical problems currently. No fevers, chills, nausea, or vomiting noted at this time. 11/14/17-He is seen in follow-up evaluation for right posterior lower extremity ulcer. There is improvement with granulation tissue present; debridement. We will continue with same treatment plan he'll follow-up next week. The vascular office did call to establish an appointment, but he forgot that we had referred him and did not make an appointment. He was encouraged to contact the office to schedule an appointment for evaluation. 11/22/17 on evaluation  today patient actually appears to be doing well in regard to his ulcer. This has cleaned up very nicely in my pinion I do believe the Iodoflex has been of benefit for him. He actually does have his arterial study which is scheduled for 11/27/17. In general he's having  no significant discomfort mainly just with cleansing of the wound but the dressings themselves don't seem to be causing him problems. 11/29/17 on evaluation today patient appears to be doing very well in regard to his lower extremity ulcer. With that being said he unfortunately prior to coming in for evaluation today did have a trip over a world where he failed hurting his back and striking his head. With that being said he has not had any altered mental status since that time and states that he's not really any more dizzy and there does not appear to be any residual effect from this. He also does not have a significant headache. With that being said this is something I did check out for him today briefly at least since he was in our office. I do think that he needs to look out for any signs or symptoms of anything worsening such as nausea, vomiting, fever, chills, confusion, or severe headaches. He has no visual disturbance at this point. 12/06/17 on evaluation today patient appears to be doing excellent in regard to his lower extremity ulcer. This is starting to really fill in which is good news as well. We do have the results of the arterial study unfortunately they did not perform the TBI portion of the examination and his ABI's were noncompressible. With that being said they did state that he had triphasic blood flow throughout the bilateral lower extremities and there was apparently no evidence of arterial disease on the arterial ultrasound. Overall this is good news we will be able to initiate stronger compression therapy which should help to allow this wound to heal even faster as the patient still continues to have some pitting  edema. 12/10/17 on evaluation today patient appears to be doing much better in regard to his lower extremity ulcer. He has been tolerating the dressing changes without complication. Fortunately the compression wrap seems to be doing excellent at this time and overall he is showing signs of good improvement. I'm happy with the overall progress that has been made over the past several weeks but especially in the past week. The patient and his wife both are extremely pleased. 12/18/17; right posterior calf ulcer. Healthy granulation. Dimension slightly smaller. Using silver collagen under compression 12/24/17 on evaluation today patient presents for follow-up concerning his ongoing right lower extremity ulcer. He has been tolerating the dressing changes without complication. Fortunately he seems to be doing excellent at this point. There does not appear to be any evidence of infection. 12/31/17 on evaluation today patient appears to be doing excellent in regard to his right lower extremity ulcer. He has been tolerating the dressing changes without complication. The wrap appears to have been very beneficial for him up to this point. With that being said he shows no signs of infection at this time and overall I'm very pleased with how things seem to be progressing. 01/07/18 on evaluation today patient actually appears to be doing excellent in regard to his ulcer. In fact there was a lot of dried dressing material noted covering the wound during evaluation today. Once I begin to remove this utilizing a curette I was noted that the patient actually had significant epithelialization underlying this event was just a very small region that appears to still be open otherwise the remainder appears to be closed. Obviously this is excellent news and I think he is progressing very nicely. 01/14/18-he is seen in follow-up evaluation. There is dried dressing which was removed to reveal an epithelialized  wound. He will be  discharged from services today Gary 08/05/2019 This is a somewhat frail 86 year old man accompanied by his wife. He was in this clinic in 2009 with a traumatic right lower extremity wound. He comes Eduardo [name], Eduardo [name]. ([id number]) in this time having suffered a fall on 07/07/2019 primary physician noted a hematoma and x-ray of the arm was negative. It was a bit difficult difficult to figure this out but looking through home health link however his wife says this area actually closed over and the hematoma resolved however he had a second fall on 07/29/2019 with a more substantial hematoma involving the lateral aspect of the right elbow a skin tear over this area and a substantial amount of bleeding in the posterior upper humerus area. Again an x-ray was negative. He has been applying Vaseline gauze twice daily with the help of his wife. The wound was debrided by primary care on 07/31/2019. His wife states that she is overwhelmed with the idea of having to care for this wound and request home health. And is much as a can guarantee anything these days with home health we will try to get somebody out to help her with the dressing. The idea of stopping the Eliquis that he is on for chronic atrial fibrillation in an elderly man who falls has been brought up before and apparently was done once with the assistance of Dr. Tamala Julian his cardiologist in [place] however he apparently had a mini stroke shortly thereafter so he is back on Eliquis. He apparently has gait instability he has a walker Past medical history; atrial fibrillation on Eliquis COPD, history of cerebrovascular disease, hypertension, diastolic heart failure COPD and coronary artery disease 08/12/2019; this is a patient with a substantial hematoma over the lateral epicondyle of the right elbow. I left this intact last week and wrapped with compression however he arrives in clinic this week with 3 gaping holes in the epithelialization over  the hematoma. I did not think any of this was going to be viable. I was hoping to keep this intact a little while longer however I do not think this is going to be the case. He required an extensive debridement silver alginate to the resultant wound 08/18/2019 upon evaluation today patient is seen by myself for the first time today regarding the hematoma and injury on his right elbow location where he has a skin tear as well. Unfortunately he still has some hematoma remaining in the proximal portion of the wound. Fortunately there is no signs of active infection at this time. No fevers, chills, nausea, vomiting, or diarrhea. With that being said he has not had any additional falls which is good news and overall it does look like this is getting better compared to what it was when he first came back into the clinic. There has been some concern over whether or not he should continue with the blood thinners secondary to the fact that again he keeps having falls and again this could be detrimental if he were to strike his head. Nonetheless I am not sure there is been a consensus with his cardiologist yet as to whether or not the blood thinner should be stopped. Objective Constitutional Well-nourished and well-hydrated in no acute distress. Vitals Time Taken: 3:40 PM, Height: 62 in, Weight: 141 lbs, BMI: 25.8, Temperature: 97.9 F, Pulse: 77 bpm, Respiratory Rate: 18 breaths/min, Blood Pressure: 161/74 mmHg. Respiratory normal breathing without difficulty. Psychiatric this patient is able to make decisions and demonstrates good  insight into disease process. Alert and Oriented x 3. pleasant and cooperative. General Notes: Patient's wound bed currently did require some sharp debridement to remove slough, congealed blood, and necrotic tissue from the surface of the wound. He tolerated debridement today with minimal discomfort the good news is post debridement wound bed appears to be doing much better.  Still there is a long ways to go to get this area to heal appropriately. Integumentary (Hair, Skin) Wound #2 status is Open. Original cause of wound was Trauma. The wound is located on the Right Elbow. The wound measures 3.2cm length x 2.2cm width x 0.7cm depth; 5.529cm^2 area and 3.87cm^3 volume. There is Fat Layer (Subcutaneous Tissue) Exposed exposed. There is no tunneling or undermining noted. There is a medium amount of serosanguineous drainage noted. There is medium (34-66%) red granulation within the wound bed. There is a medium (34-66%) amount of necrotic tissue within the wound bed including Eschar and Adherent Slough. Assessment Active Problems ICD-10 Eduardo [name], Eduardo [name]. ([id number]) Contusion of right upper arm, subsequent encounter Adverse effect of anticoagulants, sequela Procedures Wound #2 Pre-procedure diagnosis of Wound #2 is a Skin Tear located on the Right Elbow . There was a Excisional Skin/Subcutaneous Tissue Debridement with a total area of 7.04 sq cm performed by STONE III, Dejean Tribby E., PA-C. With the following instrument(s): Curette to remove Viable and Non-Viable tissue/material. Material removed includes Blood Clots, Subcutaneous Tissue, and Slough after achieving pain control using Lidocaine 4% Topical Solution. No specimens were taken. A time out was conducted at 16:05, prior to the start of the procedure. A Minimum amount of bleeding was controlled with Silver Nitrate. The procedure was tolerated well with a pain level of 0 throughout and a pain level of 0 following the procedure. Post Debridement Measurements: 3.2cm length x 2.2cm width x 0.9cm depth; 4.976cm^3 volume. Character of Wound/Ulcer Post Debridement is improved. Post procedure Diagnosis Wound #2: Same as Pre-Procedure Plan Wound Cleansing: Wound #2 Right Elbow: Clean wound with Normal Saline. Anesthetic (add to Medication List): Wound #2 Right Elbow: Topical Lidocaine 4% cream applied to wound bed  prior to debridement (In Clinic Only). Primary Wound Dressing: Wound #2 Right Elbow: Silver Alginate Secondary Dressing: Wound #2 Right Elbow: ABD and Kerlix/Conform - Ace wrap to secure Dressing Change Frequency: Wound #2 Right Elbow: Change Dressing Monday, Wednesday, Friday Follow-up Appointments: Wound #2 Right Elbow: Return Appointment in 1 week. Additional Orders / Instructions: Wound #2 Right Elbow: Activity as tolerated Home Health: Wound #2 Right Elbow: Continue Home Health Visits - [place] Nurse may visit PRN to address patient s wound care needs. FACE TO FACE ENCOUNTER: MEDICARE and MEDICAID PATIENTS: I certify that this patient is under my care and that I had a face-to-face encounter that meets the physician face-to-face encounter requirements with this patient on this date. The encounter with the patient was in whole or in part for the following MEDICAL CONDITION: (primary reason for [place]) MEDICAL NECESSITY: I certify, that based on my findings, NURSING services are a medically necessary home health service. HOME BOUND STATUS: I certify that my clinical findings support that this patient is homebound (i.e., Due to illness or injury, pt requires aid of supportive devices such as crutches, cane, wheelchairs, walkers, the use of special transportation or the assistance of another person to leave their place of residence. There is a normal inability to leave the home and doing so requires considerable and taxing effort. Other absences are for medical reasons / religious services  and are infrequent or of short duration when for other reasons). If current dressing causes regression in wound condition, may D/C ordered dressing product/s and apply Normal Saline Moist Dressing daily until next [place] / Other MD appointment. [place] of regression in wound condition at [phone]. Please direct any NON-WOUND related  issues/requests for orders to patient's Primary Care Physician 1. I would recommend currently that we going continue with the wound care measures as before with the silver alginate dressing. I do think with the debridement today it will make it much easier to perform the dressing changes. 2. I am also can recommend that the patient continue to tolerate activity as he can and I do not see any limitations we have to have here. Eduardo [name], [name] ([id number]) 3. I am also can recommend at this point that we had the patient continue with using the Ace wrap to help with compression to prevent any additional hematoma from forming I think he may be passed that point but nonetheless I think to be safe that still a good way to go. We will see patient back for reevaluation in 1 week here in the clinic. If anything worsens or changes patient will contact our office for additional recommendations. Electronic Signature(s) Signed: 08/18/2019 4:44:50 PM By: Worthy Keeler PA-C Entered By: Worthy Keeler on 08/18/2019 16:44:50 Eduardo [name], [name] ([id number]) -------------------------------------------------------------------------------- SuperBill Details Patient Name: Eduardo [name]. Date of Service: 08/18/2019 Medical Record Number: [id number] Patient Account Number: [id number] Date of Birth/Sex: Dec 28, 1930 (86 y.o. M) Treating RN: [name] Primary Care Provider: [name] Other Clinician: Referring Provider: [name] Treating Provider/Extender: Melburn Hake, Quita Mcgrory Weeks in Treatment: 1 Diagnosis Coding ICD-10 Codes Code Description S40.021D Contusion of right upper arm, subsequent encounter T45.515S Adverse effect of anticoagulants, sequela Facility Procedures CPT4 Code: 01007121 Description: 97588 - DEB SUBQ TISSUE 20 SQ CM/< Modifier: Quantity: 1 CPT4 Code: Description: ICD-10 Diagnosis Description S40.021D Contusion of right upper arm, subsequent  encounter Modifier: Quantity: Physician Procedures CPT4 Code: 3254982 Description: 64158 - WC PHYS SUBQ TISS 20 SQ CM Modifier: Quantity: 1 CPT4 Code: Description: ICD-10 Diagnosis Description S40.021D Contusion of right upper arm, subsequent encounter Modifier: Quantity: Electronic Signature(s) Signed: 08/18/2019 4:45:05 PM By: Worthy Keeler PA-C Entered By: Worthy Keeler on 08/18/2019 16:45:04

## 2019-08-18 NOTE — Progress Notes (Signed)
TREJAN, BUDA ([id number]) Visit Report for 08/05/2019 Allergy List Details Patient Name: Eduardo [name], Eduardo [name]. Date of Service: 08/05/2019 9:45 AM Medical Record Number: [id number] Patient Account Number: [id number] Date of Birth/Sex: 1930/07/30 (86 y.o. M) Treating RN: Army Melia Primary Care Kaymon Denomme: [name] Other Clinician: Referring Rodnisha Blomgren: [name] Treating Riki Gehring/Extender: Tito Dine in Treatment: 0 Allergies Active Allergies penicillin Reaction: hives Sulfa (Sulfonamide Antibiotics) Reaction: interferes with Kidneys Allergy Notes Electronic Signature(s) Signed: 08/05/2019 4:52:55 PM By: Army Melia Entered By: Army Melia on 08/05/2019 10:00:51 Eduardo [name], [name] ([id number]) -------------------------------------------------------------------------------- Arrival Information Details Patient Name: Eduardo [name]. Date of Service: 08/05/2019 9:45 AM Medical Record Number: [id number] Patient Account Number: [id number] Date of Birth/Sex: 28-May-1931 (86 y.o. M) Treating RN: Army Melia Primary Care Reo Portela: [name] Other Clinician: Referring Ruqayya Ventress: [name] Treating Diontay Rosencrans/Extender: Tito Dine in Treatment: 0 Visit Information Patient Arrived: Gilford Rile Arrival Time: 09:51 Accompanied By: wife Transfer Assistance: None Patient Identification Verified: Yes History Since Last Visit Added or deleted any medications: No Any new allergies or adverse reactions: No Had a fall or experienced change in activities of daily living that may affect risk of falls: No Signs or symptoms of abuse/neglect since last visito No Hospitalized since last visit: No Has Dressing in Place as Prescribed: Yes Electronic Signature(s) Signed: 08/05/2019 4:52:55 PM By: Army Melia Entered By: Army Melia on 08/05/2019 09:51:44 Eduardo [name], [name]  ([id number]) -------------------------------------------------------------------------------- Clinic Level of Care Assessment Details Patient Name: Eduardo [name]. Date of Service: 08/05/2019 9:45 AM Medical Record Number: [id number] Patient Account Number: [id number] Date of Birth/Sex: [birth year] (86 y.o. M) Treating RN: Cornell Barman Primary Care Rik Wadel: [name] Other Clinician: Referring Marvis Saefong: [name] Treating Shilo Philipson/Extender: Tito Dine in Treatment: 0 Clinic Level of Care Assessment Items TOOL 2 Quantity Score []  - Use when only an EandM is performed on the INITIAL visit 0 ASSESSMENTS - Nursing Assessment / Reassessment X - General Physical Exam (combine w/ comprehensive assessment (listed just below) when performed on new pt. 1 20 evals) X- 1 25 Comprehensive Assessment (HX, ROS, Risk Assessments, Wounds Hx, etc.) ASSESSMENTS - Wound and Skin Assessment / Reassessment X - Simple Wound Assessment / Reassessment - one wound 1 5 []  - 0 Complex Wound Assessment / Reassessment - multiple wounds []  - 0 Dermatologic / Skin Assessment (not related to wound area) ASSESSMENTS - Ostomy and/or Continence Assessment and Care []  - Incontinence Assessment and Management 0 []  - 0 Ostomy Care Assessment and Management (repouching, etc.) PROCESS - Coordination of Care X - Simple Patient / Family Education for ongoing care 1 15 []  - 0 Complex (extensive) Patient / Family Education for ongoing care []  - 0 Staff obtains Programmer, systems, Records, Test Results / Process Orders []  - 0 Staff telephones HHA, Nursing Homes / Clarify orders / etc []  - 0 Routine Transfer to another Facility (non-emergent condition) []  - 0 Routine Hospital Admission (non-emergent condition) X- 1 15 New Admissions / Biomedical engineer / Ordering NPWT, Apligraf, etc. []  - 0 Emergency Hospital Admission (emergent condition) X- 1 10 Simple Discharge Coordination []  - 0 Complex  (extensive) Discharge Coordination PROCESS - Special Needs []  - Pediatric / Minor Patient Management 0 []  - 0 Isolation Patient Management []  - 0 Hearing / Language / Visual special needs []  - 0 Assessment of Community assistance (transportation, D/C planning, etc.) []  - 0 Additional assistance / Altered mentation []  - 0 Support Surface(s) Assessment (bed, cushion, seat, etc.) INTERVENTIONS - Wound Cleansing /  Measurement X - Wound Imaging (photographs - any number of wounds) 1 5 Eduardo [name], [name] ([id number]) []  - 0 Wound Tracing (instead of photographs) X- 1 5 Simple Wound Measurement - one wound []  - 0 Complex Wound Measurement - multiple wounds X- 1 5 Simple Wound Cleansing - one wound []  - 0 Complex Wound Cleansing - multiple wounds INTERVENTIONS - Wound Dressings []  - Small Wound Dressing one or multiple wounds 0 []  - 0 Medium Wound Dressing one or multiple wounds X- 1 20 Large Wound Dressing one or multiple wounds []  - 0 Application of Medications - injection INTERVENTIONS - Miscellaneous []  - External ear exam 0 []  - 0 Specimen Collection (cultures, biopsies, blood, body fluids, etc.) []  - 0 Specimen(s) / Culture(s) sent or taken to Lab for analysis []  - 0 Patient Transfer (multiple staff / Civil Service fast streamer / Similar devices) []  - 0 Simple Staple / Suture removal (25 or less) []  - 0 Complex Staple / Suture removal (26 or more) []  - 0 Hypo / Hyperglycemic Management (close monitor of Blood Glucose) []  - 0 Ankle / Brachial Index (ABI) - do not check if billed separately Has the patient been seen at the hospital within the last three years: Yes Total Score: 125 Level Of Care: New/Established - Level 4 Electronic Signature(s) Signed: 08/14/2019 5:40:44 PM By: Gretta Cool, BSN, RN, CWS, Kim RN, BSN Entered By: Gretta Cool, BSN, RN, CWS, Kim on 08/05/2019 10:36:45 Eduardo [name], Eduardo [name]  ([id number]) -------------------------------------------------------------------------------- Encounter Discharge Information Details Patient Name: Eduardo [name]. Date of Service: 08/05/2019 9:45 AM Medical Record Number: [id number] Patient Account Number: [id number] Date of Birth/Sex: [birth year] (86 y.o. M) Treating RN: Cornell Barman Primary Care Shakinah Navis: [name] Other Clinician: Referring Claudell Rhody: [name] Treating Ovetta Bazzano/Extender: Tito Dine in Treatment: 0 Encounter Discharge Information Items Post Procedure Vitals Discharge Condition: Stable Temperature (F): 98.7 Ambulatory Status: Ambulatory Pulse (bpm): 63 Discharge Destination: Home Respiratory Rate (breaths/min): 16 Transportation: Private Auto Blood Pressure (mmHg): 143/46 Accompanied By: wife Schedule Follow-up Appointment: Yes Clinical Summary of Care: Electronic Signature(s) Signed: 08/14/2019 5:40:44 PM By: Gretta Cool, BSN, RN, CWS, Kim RN, BSN Entered By: Gretta Cool, BSN, RN, CWS, Kim on 08/05/2019 10:40:12 [place], Eduardo [name] ([id number]) -------------------------------------------------------------------------------- Lower Extremity Assessment Details Patient Name: Eduardo [name]. Date of Service: 08/05/2019 9:45 AM Medical Record Number: [id number] Patient Account Number: [id number] Date of Birth/Sex: 1930-10-08 (86 y.o. M) Treating RN: Army Melia Primary Care Risha Barretta: [name] Other Clinician: Referring Willeen Novak: [name] Treating Bayli Quesinberry/Extender: Tito Dine in Treatment: 0 Electronic Signature(s) Signed: 08/05/2019 4:52:55 PM By: Army Melia Entered By: Army Melia on 08/05/2019 10:00:41 Eduardo [name], [place]. ([id number]) -------------------------------------------------------------------------------- Multi Wound Chart Details Patient Name: Eduardo [name]. Date of Service: 08/05/2019 9:45 AM Medical Record Number: [id number] Patient Account  Number: [id number] Date of Birth/Sex: 30-Jul-1930 (86 y.o. M) Treating RN: Cornell Barman Primary Care Makylee Sanborn: [name] Other Clinician: Referring Nameer Summer: [name] Treating Jaaziah Schulke/Extender: Tito Dine in Treatment: 0 Vital Signs Height(in): 62 Pulse(bpm): 71 Weight(lbs): 141 Blood Pressure(mmHg): 143/46 Body Mass Index(BMI): 26 Temperature(F): 98.7 Respiratory Rate(breaths/min): 16 Photos: [N/A:N/A] Wound Location: Right Elbow N/A N/A Wounding Event: Trauma N/A N/A Primary Etiology: Skin Tear N/A N/A Comorbid History: Glaucoma, Chronic Obstructive N/A N/A Pulmonary Disease (COPD), Arrhythmia, Congestive Heart Failure, Coronary Artery Disease, Myocardial Infarction, Neuropathy Date Acquired: 07/29/2019 N/A N/A Weeks of Treatment: 0 N/A N/A Wound Status: Open N/A N/A Measurements L x W x D (cm) 4.2x5.5x0.1 N/A N/A Area (cm) : 18.143 N/A N/A  Volume (cm) : 1.814 N/A N/A % Reduction in Area: 0.00% N/A N/A % Reduction in Volume: 0.00% N/A N/A Classification: Full Thickness Without Exposed N/A N/A Support Structures Exudate Amount: Medium N/A N/A Exudate Type: Serosanguineous N/A N/A Exudate Color: red, brown N/A N/A Wound Margin: Flat and Intact N/A N/A Granulation Amount: Medium (34-66%) N/A N/A Granulation Quality: Red N/A N/A Necrotic Amount: Medium (34-66%) N/A N/A Exposed Structures: Fat Layer (Subcutaneous Tissue) N/A N/A Exposed: Yes Fascia: No Tendon: No Muscle: No Joint: No Bone: No Epithelialization: None N/A N/A Debridement: Debridement - Selective/Open N/A N/A Wound Pre-procedure Verification/Time 10:31 N/A N/A Out Taken: Pain Control: Lidocaine N/A N/A Police, Etienne M. ([id number]) Level: Skin/Dermis N/A N/A Debridement Area (sq cm): 0.2 N/A N/A Instrument: Scissors N/A N/A Bleeding: Minimum N/A N/A Hemostasis Achieved: Pressure N/A N/A Debridement Treatment Procedure was tolerated well N/A N/A Response: Post  Debridement Measurements 4.2x5.5x0.1 N/A N/A L x W x D (cm) Post Debridement Volume: (cm) 1.814 N/A N/A Procedures Performed: Debridement N/A N/A Treatment Notes Wound #2 (Right Forearm) 2. Anesthetic Topical Lidocaine 4% cream to wound bed prior to debridement Notes Silvercell, Abd, conform, ace wrap Electronic Signature(s) Signed: 08/14/2019 4:59:06 PM By: Gretta Cool, BSN, RN, CWS, Kim RN, BSN Previous Signature: 08/05/2019 5:01:35 PM Version By: Linton Ham MD Entered By: Gretta Cool, BSN, RN, CWS, Kim on 08/14/2019 16:59:06 Eduardo [name], Eduardo [name] ([id number]) -------------------------------------------------------------------------------- Multi-Disciplinary Care Plan Details Patient Name: Eduardo [name], Eduardo [name]. Date of Service: 08/05/2019 9:45 AM Medical Record Number: [id number] Patient Account Number: [id number] Date of Birth/Sex: [birth year] (86 y.o. M) Treating RN: Cornell Barman Primary Care Bethani Brugger: [name] Other Clinician: Referring Daiden Coltrane: [name] Treating Teanna Elem/Extender: Tito Dine in Treatment: 0 Active Inactive Abuse / Safety / Falls / Self Care Management Nursing Diagnoses: History of Falls Goals: Patient will not experience any injury related to falls Date Initiated: 08/05/2019 Target Resolution Date: 08/19/2019 Goal Status: Active Interventions: Assess fall risk on admission and as needed Notes: Necrotic Tissue Nursing Diagnoses: Impaired tissue integrity related to necrotic/devitalized tissue Goals: Necrotic/devitalized tissue will be minimized in the wound bed Date Initiated: 08/05/2019 Target Resolution Date: 08/19/2019 Goal Status: Active Interventions: Assess patient pain level pre-, during and post procedure and prior to discharge Treatment Activities: Apply topical anesthetic as ordered : 08/05/2019 Notes: Orientation to the Wound Care Program Nursing Diagnoses: Knowledge deficit related to the wound healing center  program Goals: Patient/caregiver will verbalize understanding of the [place] Program Date Initiated: 08/05/2019 Target Resolution Date: 08/19/2019 Goal Status: Active Interventions: Provide education on orientation to the wound center Notes: Pain, Acute or Chronic Eduardo [name], [name] ([id number]) Nursing Diagnoses: Potential alteration in comfort, pain Goals: Patient will verbalize adequate pain control and receive pain control interventions during procedures as needed Date Initiated: 08/05/2019 Target Resolution Date: 08/19/2019 Goal Status: Active Interventions: Assess comfort goal upon admission Complete pain assessment as per visit requirements Notes: Wound/Skin Impairment Nursing Diagnoses: Impaired tissue integrity Goals: Patient/caregiver will verbalize understanding of skin care regimen Date Initiated: 08/05/2019 Target Resolution Date: 08/19/2019 Goal Status: Active Ulcer/skin breakdown will have a volume reduction of 30% by week 4 Date Initiated: 08/05/2019 Target Resolution Date: 09/02/2019 Goal Status: Active Interventions: Assess patient/caregiver ability to obtain necessary supplies Assess ulceration(s) every visit Treatment Activities: Patient referred to home care : 08/05/2019 Notes: Electronic Signature(s) Signed: 08/14/2019 5:40:44 PM By: Gretta Cool, BSN, RN, CWS, Kim RN, BSN Entered By: Gretta Cool, BSN, RN, CWS, Kim on 08/05/2019 10:28:52 Eduardo [name], Eduardo [name] ([id number]) -------------------------------------------------------------------------------- Pain Assessment Details Patient Name:  Eduardo [name], [name] Date of Service: 08/05/2019 9:45 AM Medical Record Number: [id number] Patient Account Number: [id number] Date of Birth/Sex: 12-Aug-1930 (86 y.o. M) Treating RN: Army Melia Primary Care Danijela Vessey: [name] Other Clinician: Referring Lourdes Kucharski: [name] Treating Beckham Buxbaum/Extender: Tito Dine in Treatment: 0 Active  Problems Location of Pain Severity and Description of Pain Patient Has Paino Yes Site Locations Pain Location: Pain in Ulcers Rate the pain. Current Pain Level: 6 Pain Management and Medication Current Pain Management: Electronic Signature(s) Signed: 08/05/2019 4:52:55 PM By: Army Melia Entered By: Army Melia on 08/05/2019 09:52:05 Eduardo [name], Kendarius M. ([id number]) -------------------------------------------------------------------------------- Patient/Caregiver Education Details Patient Name: Eduardo [name]. Date of Service: 08/05/2019 9:45 AM Medical Record Number: [id number] Patient Account Number: [id number] Date of Birth/Gender: 04-08-1931 (86 y.o. M) Treating RN: Cornell Barman Primary Care Physician: [name] Other Clinician: Referring Physician: [name] Treating Physician/Extender: Tito Dine in Treatment: 0 Education Assessment Education Provided To: Patient Education Topics Provided Safety: Handouts: Personal Safety Methods: Demonstration Responses: State content correctly Wound/Skin Impairment: Handouts: Caring for Your Ulcer, Other: wound care as prescribed Methods: Demonstration, Explain/Verbal Responses: State content correctly Electronic Signature(s) Signed: 08/14/2019 5:40:44 PM By: Gretta Cool, BSN, RN, CWS, Kim RN, BSN Entered By: Gretta Cool, BSN, RN, CWS, Kim on 08/05/2019 10:37:41 [place]un[place], Eduardo [name] ([id number]) -------------------------------------------------------------------------------- Wound Assessment Details Patient Name: Eduardo [name]. Date of Service: 08/05/2019 9:45 AM Medical Record Number: [id number] Patient Account Number: [id number] Date of Birth/Sex: 01-21-1931 (86 y.o. M) Treating RN: Cornell Barman Primary Care Kenetra Hildenbrand: [name] Other Clinician: Referring Gerard Cantara: [name] Treating Danny Zimny/Extender: Tito Dine in Treatment: 0 Wound Status Wound Number: 2 Primary Skin  Tear Etiology: Wound Location: Right Elbow Wound Open Wounding Event: Trauma Status: Date Acquired: 07/29/2019 Comorbid Glaucoma, Chronic Obstructive Pulmonary Disease (COPD), Weeks Of Treatment: 0 History: Arrhythmia, Congestive Heart Failure, Coronary Artery Clustered Wound: No Disease, Myocardial Infarction, Neuropathy Photos Wound Measurements Length: (cm) 4.2 Width: (cm) 5.5 Depth: (cm) 0.1 Area: (cm) 18.143 Volume: (cm) 1.814 % Reduction in Area: 0% % Reduction in Volume: 0% Epithelialization: None Tunneling: No Undermining: No Wound Description Classification: Full Thickness Without Exposed Support Structu Wound Margin: Flat and Intact Exudate Amount: Medium Exudate Type: Serosanguineous Exudate Color: red, brown res Foul Odor After Cleansing: No Slough/Fibrino Yes Wound Bed Granulation Amount: Medium (34-66%) Exposed Structure Granulation Quality: Red Fascia Exposed: No Necrotic Amount: Medium (34-66%) Fat Layer (Subcutaneous Tissue) Exposed: Yes Necrotic Quality: Adherent Slough Tendon Exposed: No Muscle Exposed: No Joint Exposed: No Bone Exposed: No Treatment Notes Wound #2 (Right Forearm) 2. Anesthetic Topical Lidocaine 4% cream to wound bed prior to debridement Notes Silvercell, Abd, conform, ace wrap EMRIK, ERHARD ([id number]) Electronic Signature(s) Signed: 08/14/2019 4:58:45 PM By: Gretta Cool, BSN, RN, CWS, Kim RN, BSN Previous Signature: 08/05/2019 4:52:55 PM Version By: Army Melia Entered By: Gretta Cool BSN, RN, CWS, Kim on 08/14/2019 16:58:45 Ensey, Chandon Jerilynn [name] ([id number]) -------------------------------------------------------------------------------- [place] Details Patient Name: Eduardo [name]. Date of Service: 08/05/2019 9:45 AM Medical Record Number: [id number] Patient Account Number: [id number] Date of Birth/Sex: Jun 21, 1930 (86 y.o. M) Treating RN: Army Melia Primary Care Xian Apostol: [name] Other Clinician: Referring  Chandlor Noecker: [name] Treating Channin Agustin/Extender: Tito Dine in Treatment: 0 Vital Signs Time Taken: 09:52 Temperature (F): 98.7 Height (in): 62 Pulse (bpm): 63 Source: Stated Respiratory Rate (breaths/min): 16 Weight (lbs): 141 Blood Pressure (mmHg): 143/46 Source: Stated Reference Range: 80 - 120 mg / dl Body Mass Index (BMI): 25.8 Electronic Signature(s) Signed: 08/05/2019 4:52:55 PM By:  Nicki Reaper, [place] Entered By: Army Melia on 08/05/2019 09:52:49

## 2019-08-18 NOTE — Progress Notes (Signed)
Eduardo [name] ([id number]) Visit Report for 08/05/2019 Chief Complaint Document Details Patient Name: Eduardo [name], Eduardo [name]. Date of Service: 08/05/2019 9:45 AM Medical Record Number: [id number] Patient Account Number: [id number] Date of Birth/Sex: November 17, 1930 (86 y.o. M) Treating RN: Cornell Barman Primary Care Provider: [name] Other Clinician: Referring Provider: [name] Treating Provider/Extender: Tito Dine in Treatment: 0 Information Obtained from: Patient Chief Complaint Right lower leg ulcer due to trauma 08/05/2019; patient is here for review of a hematoma and skin tear on the lateral part of the right elbow Electronic Signature(s) Signed: 08/14/2019 4:57:36 PM By: Gretta Cool, BSN, RN, CWS, Kim RN, BSN Signed: 08/18/2019 9:29:19 AM By: Linton Ham MD Previous Signature: 08/05/2019 5:01:35 PM Version By: Linton Ham MD Entered By: Gretta Cool, BSN, RN, CWS, Kim on 08/14/2019 16:57:36 Eduardo [name], Eduardo [name] ([id number]) -------------------------------------------------------------------------------- Debridement Details Patient Name: Eduardo [name]. Date of Service: 08/05/2019 9:45 AM Medical Record Number: [id number] Patient Account Number: [id number] Date of Birth/Sex: 28-Feb-1931 (86 y.o. M) Treating RN: Cornell Barman Primary Care Provider: [name] Other Clinician: Referring Provider: [name] Treating Provider/Extender: Tito Dine in Treatment: 0 Debridement Performed for Wound #2 Right Forearm Assessment: Performed By: Physician Ricard Dillon, MD Debridement Type: Debridement Level of Consciousness (Pre- Awake and Alert procedure): Pre-procedure Verification/Time Out Yes - 10:31 Taken: Start Time: 10:31 Pain Control: Lidocaine Total Area Debrided (L x W): 1 (cm) x 0.2 (cm) = 0.2 (cm) Tissue and other material debrided: Non-Viable, Skin: Dermis Level: Skin/Dermis Debridement Description: Selective/Open  Wound Instrument: Scissors Bleeding: Minimum Hemostasis Achieved: Pressure End Time: 10:32 Response to Treatment: Procedure was tolerated well Level of Consciousness (Post- Awake and Alert procedure): Post Debridement Measurements of Total Wound Length: (cm) 4.2 Width: (cm) 5.5 Depth: (cm) 0.1 Volume: (cm) 1.814 Character of Wound/Ulcer Post Debridement: Stable Post Procedure Diagnosis Same as Pre-procedure Electronic Signature(s) Signed: 08/05/2019 5:01:35 PM By: Linton Ham MD Signed: 08/14/2019 5:40:44 PM By: Gretta Cool, BSN, RN, CWS, Kim RN, BSN Entered By: Linton Ham on 08/05/2019 10:47:47 Eduardo [name], [name] ([id number]) -------------------------------------------------------------------------------- HPI Details Patient Name: Eduardo [name]. Date of Service: 08/05/2019 9:45 AM Medical Record Number: [id number] Patient Account Number: [id number] Date of Birth/Sex: [birth year] (86 y.o. M) Treating RN: Cornell Barman Primary Care Provider: [name] Other Clinician: Referring Provider: [name] Treating Provider/Extender: Tito Dine in Treatment: 0 History of Present Illness HPI Description: 11/08/17 on evaluation today patient presents initially concerning a right dramatic lower extremity ulcer/skin tear which occurred three weeks ago. He states that he initially was seen in urgent care where the area was sutured closed. The sutures stayed in for two weeks he tells me. Subsequently he was seen at Dr. Alla German office where the sutures were removed and Steri-Strips were placed over the next five days. Subsequently these were also removed and unfortunately at this time it was noted that the patient had a dehisced larger wound that required further attention. The patient and his wife actually requested referral to the wound care center in [place]. [name] made the referral to [place]. Subsequently the patient does have a history of atrial fibrillation,  cardiovascular disease, COPD, stroke, and is on long-term Eliquis. There's no history of diabetes that he states. Subsequently he has no other major medical problems currently. No fevers, chills, nausea, or vomiting noted at this time. 11/14/17-He is seen in follow-up evaluation for right posterior lower extremity ulcer. There is improvement with granulation tissue present; debridement. We will continue with same treatment plan he'll follow-up next  week. The vascular office did call to establish an appointment, but he forgot that we had referred him and did not make an appointment. He was encouraged to contact the office to schedule an appointment for evaluation. 11/22/17 on evaluation today patient actually appears to be doing well in regard to his ulcer. This has cleaned up very nicely in my pinion I do believe the Iodoflex has been of benefit for him. He actually does have his arterial study which is scheduled for 11/27/17. In general he's having no significant discomfort mainly just with cleansing of the wound but the dressings themselves don't seem to be causing him problems. 11/29/17 on evaluation today patient appears to be doing very well in regard to his lower extremity ulcer. With that being said he unfortunately prior to coming in for evaluation today did have a trip over a world where he failed hurting his back and striking his head. With that being said he has not had any altered mental status since that time and states that he's not really any more dizzy and there does not appear to be any residual effect from this. He also does not have a significant headache. With that being said this is something I did check out for him today briefly at least since he was in our office. I do think that he needs to look out for any signs or symptoms of anything worsening such as nausea, vomiting, fever, chills, confusion, or severe headaches. He has no visual disturbance at this point. 12/06/17 on evaluation  today patient appears to be doing excellent in regard to his lower extremity ulcer. This is starting to really fill in which is good news as well. We do have the results of the arterial study unfortunately they did not perform the TBI portion of the examination and his ABI's were noncompressible. With that being said they did state that he had triphasic blood flow throughout the bilateral lower extremities and there was apparently no evidence of arterial disease on the arterial ultrasound. Overall this is good news we will be able to initiate stronger compression therapy which should help to allow this wound to heal even faster as the patient still continues to have some pitting edema. 12/10/17 on evaluation today patient appears to be doing much better in regard to his lower extremity ulcer. He has been tolerating the dressing changes without complication. Fortunately the compression wrap seems to be doing excellent at this time and overall he is showing signs of good improvement. I'm happy with the overall progress that has been made over the past several weeks but especially in the past week. The patient and his wife both are extremely pleased. 12/18/17; right posterior calf ulcer. Healthy granulation. Dimension slightly smaller. Using silver collagen under compression 12/24/17 on evaluation today patient presents for follow-up concerning his ongoing right lower extremity ulcer. He has been tolerating the dressing changes without complication. Fortunately he seems to be doing excellent at this point. There does not appear to be any evidence of infection. 12/31/17 on evaluation today patient appears to be doing excellent in regard to his right lower extremity ulcer. He has been tolerating the dressing changes without complication. The wrap appears to have been very beneficial for him up to this point. With that being said he shows no signs of infection at this time and overall I'm very pleased with how  things seem to be progressing. 01/07/18 on evaluation today patient actually appears to be doing excellent in regard to  his ulcer. In fact there was a lot of dried dressing material noted covering the wound during evaluation today. Once I begin to remove this utilizing a curette I was noted that the patient actually had significant epithelialization underlying this event was just a very small region that appears to still be open otherwise the remainder appears to be closed. Obviously this is excellent news and I think he is progressing very nicely. 01/14/18-he is seen in follow-up evaluation. There is dried dressing which was removed to reveal an epithelialized wound. He will be discharged from services today Roc[place]le 08/05/2019 This is a somewhat frail 86 year old man accompanied by his wife. He was in this clinic in 2009 with a traumatic right lower extremity wound. He comes in this time having suffered a fall on 07/07/2019 primary physician noted a hematoma and x-ray of the arm was negative. It was a bit difficult difficult to figure this out but looking through home health link however his wife says this area actually closed over and the hematoma resolved however he had a second fall on 07/29/2019 with a more substantial hematoma involving the lateral aspect of the right elbow a skin tear over this area and a substantial amount of bleeding in the posterior upper humerus area. Again an x-ray was negative. He has been applying Vaseline gauze twice daily with the help of his wife. The wound was debrided by primary care on 07/31/2019. ARTHUR, SPEAGLE ([id number]) His wife states that she is overwhelmed with the idea of having to care for this wound and request home health. And is much as a can guarantee anything these days with home health we will try to get somebody out to help her with the dressing. The idea of stopping the Eliquis that he is on for chronic atrial fibrillation in an elderly man  who falls has been brought up before and apparently was done once with the assistance of Dr. Tamala Julian his cardiologist in [place] however he apparently had a mini stroke shortly thereafter so he is back on Eliquis. He apparently has gait instability he has a walker Past medical history; atrial fibrillation on Eliquis COPD, history of cerebrovascular disease, hypertension, diastolic heart failure COPD and coronary artery disease Electronic Signature(s) Signed: 08/05/2019 5:01:35 PM By: Linton Ham MD Entered By: Linton Ham on 08/05/2019 10:51:05 Eduardo [name], Eduardo [name] ([id number]) -------------------------------------------------------------------------------- Physical Exam Details Patient Name: Eduardo [name]. Date of Service: 08/05/2019 9:45 AM Medical Record Number: [id number] Patient Account Number: [id number] Date of Birth/Sex: Jan 11, 1931 (86 y.o. M) Treating RN: Cornell Barman Primary Care Provider: [name] Other Clinician: Referring Provider: [name] Treating Provider/Extender: Tito Dine in Treatment: 0 Constitutional Wide pulse pressure but otherwise the patient looks well sitting. Pulse regular and within target range for patient.Marland Kitchen Respirations regular, non-labored and within target range.. Temperature is normal and within the target range for the patient.. Patient is not in any overt systemic distress. Cardiovascular Both radial and brachial pulses on the right arm were vibrant and easily palpable. Musculoskeletal Right elbow flexion and extension pronation and supination seems stable there is no exquisite pain elicited. Integumentary (Hair, Skin) Large hematoma over the lateral aspect of the right elbow and hematoma extending into the posterior humerus.Marland Kitchen Psychiatric No evidence of depression, anxiety, or agitation. Calm, cooperative, and communicative. Appropriate interactions and affect.. Notes Wound exam; the concerning areas on the  lateral aspect of the right elbow. Tense hematoma with overlying skin tear. There is leaking fluid coming through this area which is  serosanguineous. There is no overt tenderness but the tissue is tight. There is hematoma and deep bruising in the posterior humeral area but this does not seem particularly ominous to me at least today. The skin overlying this looks stable. Electronic Signature(s) Signed: 08/05/2019 5:01:35 PM By: Linton Ham MD Entered By: Linton Ham on 08/05/2019 10:53:41 Eduardo [name], Eduardo [name] ([id number]) -------------------------------------------------------------------------------- Physician Orders Details Patient Name: Eduardo [name]. Date of Service: 08/05/2019 9:45 AM Medical Record Number: [id number] Patient Account Number: [id number] Date of Birth/Sex: September 15, 1930 (86 y.o. M) Treating RN: Cornell Barman Primary Care Provider: [name] Other Clinician: Referring Provider: [name] Treating Provider/Extender: Tito Dine in Treatment: 0 Verbal / Phone Orders: No Diagnosis Coding Wound Cleansing Wound #2 Right Forearm o Clean wound with Normal Saline. Anesthetic (add to Medication List) Wound #2 Right Forearm o Topical Lidocaine 4% cream applied to wound bed prior to debridement (In Clinic Only). Primary Wound Dressing Wound #2 Right Forearm o Silver Alginate Secondary Dressing Wound #2 Right Forearm o ABD and Kerlix/Conform - Ace wrap to secure Dressing Change Frequency Wound #2 Right Forearm o Change Dressing Monday, Wednesday, Friday Follow-up Appointments Wound #2 Right Forearm o Return Appointment in 1 week. Additional Orders / Instructions Wound #2 Right Forearm o Activity as tolerated Home Health Wound #2 Right Forearm o [place] Nurse may visit PRN to address patientos wound care needs. o FACE TO FACE ENCOUNTER: MEDICARE and MEDICAID PATIENTS: I  certify that this patient is under my care and that I had a face-to- face encounter that meets the physician face-to-face encounter requirements with this patient on this date. The encounter with the patient was in whole or in part for the following MEDICAL CONDITION: (primary reason for [place]) MEDICAL NECESSITY: I certify, that based on my findings, NURSING services are a medically necessary home health service. HOME BOUND STATUS: I certify that my clinical findings support that this patient is homebound (i.e., Due to illness or injury, pt requires aid of supportive devices such as crutches, cane, wheelchairs, walkers, the use of special transportation or the assistance of another person to leave their place of residence. There is a normal inability to leave the home and doing so requires considerable and taxing effort. Other absences are for medical reasons / religious services and are infrequent or of short duration when for other reasons). o If current dressing causes regression in wound condition, may D/C ordered dressing product/s and apply Normal Saline Moist Dressing daily until next [place] / Other MD appointment. Rew of regression in wound condition at [phone]. o Please direct any NON-WOUND related issues/requests for orders to patient's Primary Care Physician Electronic Signature(s) Signed: 08/05/2019 5:01:35 PM By: Linton Ham MD Signed: 08/14/2019 5:40:44 PM By: Gretta Cool, BSN, RN, CWS, Kim RN, BSN Entered By: Gretta Cool, BSN, RN, CWS, Kim on 08/05/2019 10:43:52 Eduardo [name], Eduardo [name] ([id number]) [address], [place] M. ([id number]) -------------------------------------------------------------------------------- Problem List Details Patient Name: TERRIL, CHESTNUT. Date of Service: 08/05/2019 9:45 AM Medical Record Number: [id number] Patient Account Number: [id number] Date of Birth/Sex: Feb 15, 1931 (86 y.o. M) Treating RN: Cornell Barman Primary Care Provider: [name] Other Clinician: Referring Provider: [name] Treating Provider/Extender: Tito Dine in Treatment: 0 Active Problems ICD-10 Evaluated Encounter Code Description Active Date Today Diagnosis S40.021D Contusion of right upper arm, subsequent encounter 08/05/2019 No Yes T45.515S Adverse effect of anticoagulants, sequela 08/05/2019 No Yes S50.01XD Contusion of right elbow, subsequent encounter  08/14/2019 No Yes Inactive Problems Resolved Problems Electronic Signature(s) Signed: 08/14/2019 4:56:38 PM By: Gretta Cool, BSN, RN, CWS, Kim RN, BSN Signed: 08/18/2019 9:29:19 AM By: Linton Ham MD Previous Signature: 08/05/2019 5:01:35 PM Version By: Linton Ham MD Entered By: Gretta Cool, BSN, RN, CWS, Kim on 08/14/2019 16:56:38 Eduardo [name], Eduardo [name] ([id number]) -------------------------------------------------------------------------------- Progress Note Details Patient Name: Eduardo [name], Eduardo [name]. Date of Service: 08/05/2019 9:45 AM Medical Record Number: [id number] Patient Account Number: [id number] Date of Birth/Sex: Dec 18, 1930 (86 y.o. M) Treating RN: Cornell Barman Primary Care Provider: [name] Other Clinician: Referring Provider: [name] Treating Provider/Extender: Tito Dine in Treatment: 0 Subjective Chief Complaint Information obtained from Patient Right lower leg ulcer due to trauma 08/05/2019; patient is here for review of a hematoma and skin tear on the lateral part of the right elbow History of Present Illness (HPI) 11/08/17 on evaluation today patient presents initially concerning a right dramatic lower extremity ulcer/skin tear which occurred three weeks ago. He states that he initially was seen in urgent care where the area was sutured closed. The sutures stayed in for two weeks he tells me. Subsequently he was seen at Dr. Alla German office where the sutures were removed and Steri-Strips were placed over  the next five days. Subsequently these were also removed and unfortunately at this time it was noted that the patient had a dehisced larger wound that required further attention. The patient and his wife actually requested referral to the wound care center in [place]. [name] made the referral to [place]. Subsequently the patient does have a history of atrial fibrillation, cardiovascular disease, COPD, stroke, and is on long-term Eliquis. There's no history of diabetes that he states. Subsequently he has no other major medical problems currently. No fevers, chills, nausea, or vomiting noted at this time. 11/14/17-He is seen in follow-up evaluation for right posterior lower extremity ulcer. There is improvement with granulation tissue present; debridement. We will continue with same treatment plan he'll follow-up next week. The vascular office did call to establish an appointment, but he forgot that we had referred him and did not make an appointment. He was encouraged to contact the office to schedule an appointment for evaluation. 11/22/17 on evaluation today patient actually appears to be doing well in regard to his ulcer. This has cleaned up very nicely in my pinion I do believe the Iodoflex has been of benefit for him. He actually does have his arterial study which is scheduled for 11/27/17. In general he's having no significant discomfort mainly just with cleansing of the wound but the dressings themselves don't seem to be causing him problems. 11/29/17 on evaluation today patient appears to be doing very well in regard to his lower extremity ulcer. With that being said he unfortunately prior to coming in for evaluation today did have a trip over a world where he failed hurting his back and striking his head. With that being said he has not had any altered mental status since that time and states that he's not really any more dizzy and there does not appear to be any residual effect from this. He also does not  have a significant headache. With that being said this is something I did check out for him today briefly at least since he was in our office. I do think that he needs to look out for any signs or symptoms of anything worsening such as nausea, vomiting, fever, chills, confusion, or severe headaches. He has no visual disturbance at this point. 12/06/17 on  evaluation today patient appears to be doing excellent in regard to his lower extremity ulcer. This is starting to really fill in which is good news as well. We do have the results of the arterial study unfortunately they did not perform the TBI portion of the examination and his ABI's were noncompressible. With that being said they did state that he had triphasic blood flow throughout the bilateral lower extremities and there was apparently no evidence of arterial disease on the arterial ultrasound. Overall this is good news we will be able to initiate stronger compression therapy which should help to allow this wound to heal even faster as the patient still continues to have some pitting edema. 12/10/17 on evaluation today patient appears to be doing much better in regard to his lower extremity ulcer. He has been tolerating the dressing changes without complication. Fortunately the compression wrap seems to be doing excellent at this time and overall he is showing signs of good improvement. I'm happy with the overall progress that has been made over the past several weeks but especially in the past week. The patient and his wife both are extremely pleased. 12/18/17; right posterior calf ulcer. Healthy granulation. Dimension slightly smaller. Using silver collagen under compression 12/24/17 on evaluation today patient presents for follow-up concerning his ongoing right lower extremity ulcer. He has been tolerating the dressing changes without complication. Fortunately he seems to be doing excellent at this point. There does not appear to be any evidence of  infection. 12/31/17 on evaluation today patient appears to be doing excellent in regard to his right lower extremity ulcer. He has been tolerating the dressing changes without complication. The wrap appears to have been very beneficial for him up to this point. With that being said he shows no signs of infection at this time and overall I'm very pleased with how things seem to be progressing. 01/07/18 on evaluation today patient actually appears to be doing excellent in regard to his ulcer. In fact there was a lot of dried dressing material noted covering the wound during evaluation today. Once I begin to remove this utilizing a curette I was noted that the patient actually had significant epithelialization underlying this event was just a very small region that appears to still be open otherwise the remainder appears to be closed. Obviously this is excellent news and I think he is progressing very nicely. 01/14/18-he is seen in follow-up evaluation. There is dried dressing which was removed to reveal an epithelialized wound. He will be discharged from services today [place] 08/05/2019 This is a somewhat frail 86 year old man accompanied by his wife. He was in this clinic in 2009 with a traumatic right lower extremity wound. He comes Eduardo [name], Eduardo [name]. ([id number]) in this time having suffered a fall on 07/07/2019 primary physician noted a hematoma and x-ray of the arm was negative. It was a bit difficult difficult to figure this out but looking through home health link however his wife says this area actually closed over and the hematoma resolved however he had a second fall on 07/29/2019 with a more substantial hematoma involving the lateral aspect of the right elbow a skin tear over this area and a substantial amount of bleeding in the posterior upper humerus area. Again an x-ray was negative. He has been applying Vaseline gauze twice daily with the help of his wife. The wound was debrided by  primary care on 07/31/2019. His wife states that she is overwhelmed with the idea of having to care  for this wound and request home health. And is much as a can guarantee anything these days with home health we will try to get somebody out to help her with the dressing. The idea of stopping the Eliquis that he is on for chronic atrial fibrillation in an elderly man who falls has been brought up before and apparently was done once with the assistance of Dr. Tamala Julian his cardiologist in [place] however he apparently had a mini stroke shortly thereafter so he is back on Eliquis. He apparently has gait instability he has a walker Past medical history; atrial fibrillation on Eliquis COPD, history of cerebrovascular disease, hypertension, diastolic heart failure COPD and coronary artery disease Patient History Information obtained from Patient. Allergies penicillin (Reaction: hives), Sulfa (Sulfonamide Antibiotics) (Reaction: interferes with Kidneys) Family History Cancer - Mother,Child, Heart Disease - Father, Lung Disease - Child, Stroke - Maternal Grandparents, No family history of Diabetes, Hereditary Spherocytosis, Hypertension, Kidney Disease, Seizures, Thyroid Problems, Tuberculosis. Social History Former smoker - 28 years, Marital Status - Married, Alcohol Use - Never, Drug Use - No History, Caffeine Use - Daily. Medical History Eyes Patient has history of Glaucoma - replaced Denies history of Cataracts, Optic Neuritis Ear/Nose/Mouth/Throat Denies history of Chronic sinus problems/congestion, Middle ear problems Hematologic/Lymphatic Denies history of Anemia, Hemophilia, Human Immunodeficiency Virus, Lymphedema, Sickle Cell Disease Respiratory Patient has history of Chronic Obstructive Pulmonary Disease (COPD) Denies history of Aspiration, Asthma, Pneumothorax, Sleep Apnea, Tuberculosis Cardiovascular Patient has history of Arrhythmia - A-fib, Congestive Heart Failure, Coronary Artery  Disease, Myocardial Infarction - 2013 Denies history of Angina, Deep Vein Thrombosis, Hypertension, Hypotension, Peripheral Arterial Disease, Peripheral Venous Disease, Phlebitis, Vasculitis Gastrointestinal Denies history of Cirrhosis , Colitis, Crohn s, Hepatitis A, Hepatitis B, Hepatitis C Endocrine Denies history of Type I Diabetes, Type II Diabetes Genitourinary Denies history of End Stage Renal Disease Immunological Denies history of Lupus Erythematosus, Raynaud s, Scleroderma Integumentary (Skin) Denies history of History of Burn, History of pressure wounds Musculoskeletal Denies history of Gout, Rheumatoid Arthritis, Osteoarthritis, Osteomyelitis Neurologic Patient has history of Neuropathy - Feet and legs Denies history of Dementia, Quadriplegia, Paraplegia, Seizure Disorder Oncologic Denies history of Received Chemotherapy, Received Radiation Psychiatric Denies history of Anorexia/bulimia, Confinement Anxiety Review of Systems (ROS) Constitutional Symptoms (General Health) Denies complaints or symptoms of Fatigue, Fever, Chills, Marked Weight Change. Eyes Denies complaints or symptoms of Dry Eyes, Vision Changes, Glasses / Contacts. Ear/Nose/Mouth/Throat Denies complaints or symptoms of Difficult clearing ears, Sinusitis. Sel[place], Clete M. ([id number]) Hematologic/Lymphatic Denies complaints or symptoms of Bleeding / Clotting Disorders, Human Immunodeficiency Virus. Respiratory Denies complaints or symptoms of Chronic or frequent coughs, Shortness of Breath. Cardiovascular Denies complaints or symptoms of Chest pain, LE edema. Gastrointestinal Denies complaints or symptoms of Frequent diarrhea, Nausea, Vomiting. Endocrine Denies complaints or symptoms of Hepatitis, Thyroid disease, Polydypsia (Excessive Thirst). Genitourinary Denies complaints or symptoms of Kidney failure/ Dialysis, Incontinence/dribbling. Immunological Denies complaints or symptoms of Hives,  Itching. Integumentary (Skin) Complains or has symptoms of Wounds. Denies complaints or symptoms of Bleeding or bruising tendency, Breakdown, Swelling. Musculoskeletal Denies complaints or symptoms of Muscle Pain, Muscle Weakness. Neurologic Denies complaints or symptoms of Numbness/parasthesias, Focal/Weakness. Psychiatric Denies complaints or symptoms of Anxiety, Claustrophobia. Objective Constitutional Wide pulse pressure but otherwise the patient looks well sitting. Pulse regular and within target range for patient.Marland Kitchen Respirations regular, non-labored and within target range.. Temperature is normal and within the target range for the patient.. Patient is not in any overt systemic distress. Vitals Time Taken: 9:52 AM, Height:  62 in, Source: Stated, Weight: 141 lbs, Source: Stated, BMI: 25.8, Temperature: 98.7 F, Pulse: 63 bpm, Respiratory Rate: 16 breaths/min, Blood Pressure: 143/46 mmHg. Cardiovascular Both radial and brachial pulses on the right arm were vibrant and easily palpable. Musculoskeletal Right elbow flexion and extension pronation and supination seems stable there is no exquisite pain elicited. Psychiatric No evidence of depression, anxiety, or agitation. Calm, cooperative, and communicative. Appropriate interactions and affect.. General Notes: Wound exam; the concerning areas on the lateral aspect of the right elbow. Tense hematoma with overlying skin tear. There is leaking fluid coming through this area which is serosanguineous. There is no overt tenderness but the tissue is tight. There is hematoma and deep bruising in the posterior humeral area but this does not seem particularly ominous to me at least today. The skin overlying this looks stable. Integumentary (Hair, Skin) Large hematoma over the lateral aspect of the right elbow and hematoma extending into the posterior humerus.. Wound #2 status is Open. Original cause of wound was Trauma. The wound is located on the  Right Elbow. The wound measures 4.2cm length x 5.5cm width x 0.1cm depth; 18.143cm^2 area and 1.814cm^3 volume. There is Fat Layer (Subcutaneous Tissue) Exposed exposed. There is no tunneling or undermining noted. There is a medium amount of serosanguineous drainage noted. The wound margin is flat and intact. There is medium (34-66%) red granulation within the wound bed. There is a medium (34-66%) amount of necrotic tissue within the wound bed including Adherent Slough. Assessment Eduardo [name], [name] ([id number]) Active Problems ICD-10 Contusion of right upper arm, subsequent encounter Adverse effect of anticoagulants, sequela Contusion of right elbow, subsequent encounter Procedures Wound #2 Pre-procedure diagnosis of Wound #2 is a Skin Tear located on the Right Forearm . There was a Selective/Open Wound Skin/Dermis Debridement with a total area of 0.2 sq cm performed by Ricard Dillon, MD. With the following instrument(s): Scissors to remove Non-Viable tissue/material. Material removed includes Skin: Dermis after achieving pain control using Lidocaine. No specimens were taken. A time out was conducted at 10:31, prior to the start of the procedure. A Minimum amount of bleeding was controlled with Pressure. The procedure was tolerated well. Post Debridement Measurements: 4.2cm length x 5.5cm width x 0.1cm depth; 1.814cm^3 volume. Character of Wound/Ulcer Post Debridement is stable. Post procedure Diagnosis Wound #2: Same as Pre-Procedure Plan Wound Cleansing: Wound #2 Right Forearm: Clean wound with Normal Saline. Anesthetic (add to Medication List): Wound #2 Right Forearm: Topical Lidocaine 4% cream applied to wound bed prior to debridement (In Clinic Only). Primary Wound Dressing: Wound #2 Right Forearm: Silver Alginate Secondary Dressing: Wound #2 Right Forearm: ABD and Kerlix/Conform - Ace wrap to secure Dressing Change Frequency: Wound #2 Right Forearm: Change Dressing  Monday, Wednesday, Friday Follow-up Appointments: Wound #2 Right Forearm: Return Appointment in 1 week. Additional Orders / Instructions: Wound #2 Right Forearm: Activity as tolerated Home Health: Wound #2 Right Forearm: Cotton for [place] Nurse may visit PRN to address patient s wound care needs. FACE TO FACE ENCOUNTER: MEDICARE and MEDICAID PATIENTS: I certify that this patient is under my care and that I had a face-to-face encounter that meets the physician face-to-face encounter requirements with this patient on this date. The encounter with the patient was in whole or in part for the following MEDICAL CONDITION: (primary reason for [place]) MEDICAL NECESSITY: I certify, that based on my findings, NURSING services are a medically necessary home health service. HOME BOUND STATUS: I  certify that my clinical findings support that this patient is homebound (i.e., Due to illness or injury, pt requires aid of supportive devices such as crutches, cane, wheelchairs, walkers, the use of special transportation or the assistance of another person to leave their place of residence. There is a normal inability to leave the home and doing so requires considerable and taxing effort. Other absences are for medical reasons / religious services and are infrequent or of short duration when for other reasons). If current dressing causes regression in wound condition, may D/C ordered dressing product/s and apply Normal Saline Moist Dressing daily until next [place] / Other MD appointment. [place] of regression in wound condition at [phone]. Please direct any NON-WOUND related issues/requests for orders to patient's Primary Care Physician [place], Andersen M. ([id number]) 1. We put silver alginate/ABDs kerlix and conform. Ace wrap to support 2. We will attempt to arrange home health to change this dressing twice 3. There is no  evidence of any vascular compromise. 4. No evidence of neurologic issues in his hand or instability in the right elbow joint itself. 5. The best thing we can hope for is to support this area and to get some resorption of the underlying tense fluid. I have warned her that it is possible this will spontaneously rupture in which case the area will bleed with a jellylike consistency. In that case we have to clean out the wound bed remove the underlying nonviable skin and try to heal a deep wound. This would be the worst case scenario. Ideally the longer this tissue stays intact with dressings and compression the better off things will be. There is no indication to evacuate this currently in my opinion I spent 35 minutes in the review of this patient's chart face-to-face examination and preparation of this record. Electronic Signature(s) Signed: 08/14/2019 4:59:35 PM By: Gretta Cool, BSN, RN, CWS, Kim RN, BSN Signed: 08/18/2019 9:29:19 AM By: Linton Ham MD Previous Signature: 08/05/2019 5:01:35 PM Version By: Linton Ham MD Entered By: Gretta Cool BSN, RN, CWS, Kim on 08/14/2019 16:59:34 [place], Eduardo [name] ([id number]) -------------------------------------------------------------------------------- ROS/PFSH Details Patient Name: Eduardo [name], VANECEK. Date of Service: 08/05/2019 9:45 AM Medical Record Number: [id number] Patient Account Number: [id number] Date of Birth/Sex: 10-01-1930 (86 y.o. M) Treating RN: Army Melia Primary Care Provider: [name] Other Clinician: Referring Provider: [name] Treating Provider/Extender: Tito Dine in Treatment: 0 Information Obtained From Patient Constitutional Symptoms (General Health) Complaints and Symptoms: Negative for: Fatigue; Fever; Chills; Marked Weight Change Eyes Complaints and Symptoms: Negative for: Dry Eyes; Vision Changes; Glasses / Contacts Medical History: Positive for: Glaucoma - replaced Negative for: Cataracts;  Optic Neuritis Ear/Nose/Mouth/Throat Complaints and Symptoms: Negative for: Difficult clearing ears; Sinusitis Medical History: Negative for: Chronic sinus problems/congestion; Middle ear problems Hematologic/Lymphatic Complaints and Symptoms: Negative for: Bleeding / Clotting Disorders; Human Immunodeficiency Virus Medical History: Negative for: Anemia; Hemophilia; Human Immunodeficiency Virus; Lymphedema; Sickle Cell Disease Respiratory Complaints and Symptoms: Negative for: Chronic or frequent coughs; Shortness of Breath Medical History: Positive for: Chronic Obstructive Pulmonary Disease (COPD) Negative for: Aspiration; Asthma; Pneumothorax; Sleep Apnea; Tuberculosis Cardiovascular Complaints and Symptoms: Negative for: Chest pain; LE edema Medical History: Positive for: Arrhythmia - A-fib; Congestive Heart Failure; Coronary Artery Disease; Myocardial Infarction - 2013 Negative for: Angina; Deep Vein Thrombosis; Hypertension; Hypotension; Peripheral Arterial Disease; Peripheral Venous Disease; Phlebitis; Vasculitis Gastrointestinal Complaints and Symptoms: Negative for: Frequent diarrhea; Nausea; Vomiting Medical History: Negative for: Cirrhosis ; Colitis; Crohnos; Hepatitis A; Hepatitis B; Hepatitis  Loletha Grayer Luiz, [place] ([id number]) Endocrine Complaints and Symptoms: Negative for: Hepatitis; Thyroid disease; Polydypsia (Excessive Thirst) Medical History: Negative for: Type I Diabetes; Type II Diabetes Genitourinary Complaints and Symptoms: Negative for: Kidney failure/ Dialysis; Incontinence/dribbling Medical History: Negative for: End Stage Renal Disease Immunological Complaints and Symptoms: Negative for: Hives; Itching Medical History: Negative for: Lupus Erythematosus; Raynaudos; Scleroderma Integumentary (Skin) Complaints and Symptoms: Positive for: Wounds Negative for: Bleeding or bruising tendency; Breakdown; Swelling Medical History: Negative for:  History of Burn; History of pressure wounds Musculoskeletal Complaints and Symptoms: Negative for: Muscle Pain; Muscle Weakness Medical History: Negative for: Gout; Rheumatoid Arthritis; Osteoarthritis; Osteomyelitis Neurologic Complaints and Symptoms: Negative for: Numbness/parasthesias; Focal/Weakness Medical History: Positive for: Neuropathy - Feet and legs Negative for: Dementia; Quadriplegia; Paraplegia; Seizure Disorder Psychiatric Complaints and Symptoms: Negative for: Anxiety; Claustrophobia Medical History: Negative for: Anorexia/bulimia; Confinement Anxiety Oncologic Medical History: Negative for: Received Chemotherapy; Received Radiation HBO Extended History Items Eyes: Glaucoma Immunizations Pneumococcal Vaccine: HJALMAR, BALLENGEE ([id number]) Received Pneumococcal Vaccination: Yes Implantable Devices None Family and Social History Cancer: Yes - Mother,Child; Diabetes: No; Heart Disease: Yes - Father; Hereditary Spherocytosis: No; Hypertension: No; Kidney Disease: No; Lung Disease: Yes - Child; Seizures: No; Stroke: Yes - Maternal Grandparents; Thyroid Problems: No; Tuberculosis: No; Former smoker - 36 years; Marital Status - Married; Alcohol Use: Never; Drug Use: No History; Caffeine Use: Daily; Financial Concerns: No; Food, Clothing or Shelter Needs: No; Support System Lacking: No; Transportation Concerns: No Electronic Signature(s) Signed: 08/05/2019 4:52:55 PM By: Army Melia Signed: 08/05/2019 5:01:35 PM By: Linton Ham MD Entered By: Army Melia on 08/05/2019 10:02:45 Shaver, Zayon M. ([id number]) -------------------------------------------------------------------------------- [place] Details Patient Name: Eduardo [name]. Date of Service: 08/05/2019 Medical Record Number: [id number] Patient Account Number: [id number] Date of Birth/Sex: Oct 20, 1930 (86 y.o. M) Treating RN: Cornell Barman Primary Care Provider: [name] Other  Clinician: Referring Provider: [name] Treating Provider/Extender: Tito Dine in Treatment: 0 Diagnosis Coding ICD-10 Codes Code Description S40.021D Contusion of right upper arm, subsequent encounter T45.515S Adverse effect of anticoagulants, sequela Facility Procedures CPT4 Code: 36468032 Description: 12248 - WOUND CARE VISIT-LEV 4 EST PT Modifier: Quantity: 1 CPT4 Code: 25003704 Description: 88891 - DEBRIDE WOUND 1ST 20 SQ CM OR < Modifier: Quantity: 1 CPT4 Code: Description: ICD-10 Diagnosis Description S40.021D Contusion of right upper arm, subsequent encounter Modifier: Quantity: Physician Procedures CPT4 Code: 6945038 Description: 88280 - WC PHYS LEVEL 4 - EST PT Modifier: 25 Quantity: 1 CPT4 Code: Description: ICD-10 Diagnosis Description S40.021D Contusion of right upper arm, subsequent encounter T45.515S Adverse effect of anticoagulants, sequela Modifier: Quantity: CPT4 Code: 0349179 Description: 15056 - WC PHYS DEBR WO ANESTH 20 SQ CM Modifier: Quantity: 1 CPT4 Code: Description: ICD-10 Diagnosis Description S40.021D Contusion of right upper arm, subsequent encounter Modifier: Quantity: Electronic Signature(s) Signed: 08/05/2019 5:01:35 PM By: Linton Ham MD Entered By: Linton Ham on 08/05/2019 10:58:30

## 2019-08-19 ENCOUNTER — Ambulatory Visit: Payer: PPO | Admitting: Internal Medicine

## 2019-08-19 NOTE — Progress Notes (Signed)
Eduardo, [name] ([id number]) Visit Report for 08/18/2019 Arrival Information Details Patient Name: Eduardo, [name]. Date of Service: 08/18/2019 3:45 PM Medical Record Number: [id number] Patient Account Number: [id number] Date of Birth/Sex: [birth year] (86 y.o. M) Treating RN: [name] Primary Care Rhapsody Wolven: [name] Other Clinician: Referring Suraya Vidrine: [name] Treating Odessia Asleson/Extender: Melburn Hake, HOYT Weeks in Treatment: 1 Visit Information History Since Last Visit Added or deleted any medications: No Patient Arrived: Walker Any new allergies or adverse reactions: No Arrival Time: 15:38 Had a fall or experienced change in No Accompanied By: wife activities of daily living that may affect Transfer Assistance: None risk of falls: Patient Identification Verified: Yes Signs or symptoms of abuse/neglect since last visito No Secondary Verification Process Completed: Yes Hospitalized since last visit: No Implantable device outside of the clinic excluding No cellular tissue based products placed in the center since last visit: Has Dressing in Place as Prescribed: Yes Has Compression in Place as Prescribed: Yes Pain Present Now: No Electronic Signature(s) Signed: 08/18/2019 4:21:39 PM By: Lorine Bears RCP, RRT, CHT Entered By: Becky Sax, Amado Nash on 08/18/2019 15:41:03 [name], [name] ([id number]) -------------------------------------------------------------------------------- Encounter Discharge Information Details Patient Name: Eduardo [name]. Date of Service: 08/18/2019 3:45 PM Medical Record Number: [id number] Patient Account Number: [id number] Date of Birth/Sex: 1931/05/19 (86 y.o. M) Treating RN: [name] Primary Care Kiya Eno: [name] Other Clinician: Referring Sneijder Bernards: [name] Treating Amunique Neyra/Extender: Melburn Hake, HOYT Weeks in Treatment: 1 Encounter Discharge Information Items Post Procedure  Vitals Discharge Condition: Stable Temperature (F): 97.9 Ambulatory Status: Walker Pulse (bpm): 77 Discharge Destination: Home Respiratory Rate (breaths/min): 16 Transportation: Private Auto Blood Pressure (mmHg): 161/74 Accompanied By: spouse Schedule Follow-up Appointment: Yes Clinical Summary of Care: Electronic Signature(s) Signed: 08/18/2019 4:36:26 PM By: [name] Entered By: [name] on 08/18/2019 16:14:27 [name], [name] ([id number]) -------------------------------------------------------------------------------- Lower Extremity Assessment Details Patient Name: Eduardo [name]. Date of Service: 08/18/2019 3:45 PM Medical Record Number: [id number] Patient Account Number: [id number] Date of Birth/Sex: 1931-04-01 (86 y.o. M) Treating RN: Army Melia Primary Care Mahima Hottle: [name] Other Clinician: Referring Jaynee Winters: [name] Treating Tomi Paddock/Extender: Melburn Hake, HOYT Weeks in Treatment: 1 Electronic Signature(s) Signed: 08/19/2019 8:09:31 AM By: Army Melia Entered By: Army Melia on 08/18/2019 15:51:38 Arceneaux, Damiean M. ([id number]) -------------------------------------------------------------------------------- Multi Wound Chart Details Patient Name: Eduardo [name]. Date of Service: 08/18/2019 3:45 PM Medical Record Number: [id number] Patient Account Number: [id number] Date of Birth/Sex: 02/25/1931 (86 y.o. M) Treating RN: [name] Primary Care Nataliya Graig: [name] Other Clinician: Referring Geovany Trudo: [name] Treating Cindia Hustead/Extender: Melburn Hake, HOYT Weeks in Treatment: 1 Vital Signs Height(in): 62 Pulse(bpm): 74 Weight(lbs): 141 Blood Pressure(mmHg): 161/74 Body Mass Index(BMI): 26 Temperature(F): 97.9 Respiratory Rate(breaths/min): 18 Photos: [N/A:N/A] Wound Location: Right Elbow N/A N/A Wounding Event: Trauma N/A N/A Primary Etiology: Skin Tear N/A N/A Comorbid History: Glaucoma, Chronic  Obstructive N/A N/A Pulmonary Disease (COPD), Arrhythmia, Congestive Heart Failure, Coronary Artery Disease, Myocardial Infarction, Neuropathy Date Acquired: 07/29/2019 N/A N/A Weeks of Treatment: 1 N/A N/A Wound Status: Open N/A N/A Measurements L x W x D (cm) 3.2x2.2x0.7 N/A N/A Area (cm) : 5.529 N/A N/A Volume (cm) : 3.87 N/A N/A % Reduction in Area: 69.50% N/A N/A % Reduction in Volume: -113.30% N/A N/A Classification: Full Thickness Without Exposed N/A N/A Support Structures Exudate Amount: Medium N/A N/A Exudate Type: Serosanguineous N/A N/A Exudate Color: red, brown N/A N/A Granulation Amount: Medium (34-66%) N/A N/A Granulation Quality: Red N/A N/A Necrotic Amount: Medium (34-66%) N/A N/A Necrotic  Tissue: Eschar, Adherent Slough N/A N/A Exposed Structures: Fat Layer (Subcutaneous Tissue) N/A N/A Exposed: Yes Fascia: No Tendon: No Muscle: No Joint: No Bone: No Epithelialization: None N/A N/A Treatment Notes Electronic Signature(s) Eduardo [name], Eduardo [name] ([id number]) Signed: 08/18/2019 4:36:26 PM By: [name] Entered By: [name] on 08/18/2019 16:02:49 Jaquess, Damarcus M. ([id number]) -------------------------------------------------------------------------------- [place] Details Patient Name: Eduardo [name]. Date of Service: 08/18/2019 3:45 PM Medical Record Number: [id number] Patient Account Number: [id number] Date of Birth/Sex: [birth year] (86 y.o. M) Treating RN: [name] Primary Care Lyndol Vanderheiden: [name] Other Clinician: Referring Maniya Donovan: [name] Treating Alonzo Owczarzak/Extender: Melburn Hake, HOYT Weeks in Treatment: 1 Active Inactive Abuse / Safety / Falls / Self Care Management Nursing Diagnoses: History of Falls Goals: Patient will not experience any injury related to falls Date Initiated: 08/05/2019 Target Resolution Date: 08/19/2019 Goal Status: Active Interventions: Assess fall risk on admission and as  needed Notes: Necrotic Tissue Nursing Diagnoses: Impaired tissue integrity related to necrotic/devitalized tissue Goals: Necrotic/devitalized tissue will be minimized in the wound bed Date Initiated: 08/05/2019 Target Resolution Date: 08/19/2019 Goal Status: Active Interventions: Assess patient pain level pre-, during and post procedure and prior to discharge Treatment Activities: Apply topical anesthetic as ordered : 08/05/2019 Notes: Orientation to the Wound Care Program Nursing Diagnoses: Knowledge deficit related to the wound healing center program Goals: Patient/caregiver will verbalize understanding of the [place] Program Date Initiated: 08/05/2019 Target Resolution Date: 08/19/2019 Goal Status: Active Interventions: Provide education on orientation to the wound center Notes: Pain, Acute or Chronic [name], [name] ([id number]) Nursing Diagnoses: Potential alteration in comfort, pain Goals: Patient will verbalize adequate pain control and receive pain control interventions during procedures as needed Date Initiated: 08/05/2019 Target Resolution Date: 08/19/2019 Goal Status: Active Interventions: Assess comfort goal upon admission Complete pain assessment as per visit requirements Notes: Wound/Skin Impairment Nursing Diagnoses: Impaired tissue integrity Goals: Patient/caregiver will verbalize understanding of skin care regimen Date Initiated: 08/05/2019 Target Resolution Date: 08/19/2019 Goal Status: Active Ulcer/skin breakdown will have a volume reduction of 30% by week 4 Date Initiated: 08/05/2019 Target Resolution Date: 09/02/2019 Goal Status: Active Interventions: Assess patient/caregiver ability to obtain necessary supplies Assess ulceration(s) every visit Treatment Activities: Patient referred to home care : 08/05/2019 Notes: Electronic Signature(s) Signed: 08/18/2019 4:36:26 PM By: [name] Entered By: [name] on 08/18/2019  16:02:41 [name], [name] ([id number]) -------------------------------------------------------------------------------- Pain Assessment Details Patient Name: Eduardo [name]. Date of Service: 08/18/2019 3:45 PM Medical Record Number: [id number] Patient Account Number: [id number] Date of Birth/Sex: 06-15-1930 (86 y.o. M) Treating RN: Army Melia Primary Care Miking Usrey: [name] Other Clinician: Referring Dynasia Kercheval: [name] Treating Houda Brau/Extender: Melburn Hake, HOYT Weeks in Treatment: 1 Active Problems Location of Pain Severity and Description of Pain Patient Has Paino No Site Locations Pain Management and Medication Current Pain Management: Electronic Signature(s) Signed: 08/19/2019 8:09:31 AM By: Army Melia Entered By: Army Melia on 08/18/2019 15:48:30 [name], [name] ([id number]) -------------------------------------------------------------------------------- Patient/Caregiver Education Details Patient Name: Eduardo [name]. Date of Service: 08/18/2019 3:45 PM Medical Record Number: [id number] Patient Account Number: [id number] Date of Birth/Gender: 09/19/1930 (86 y.o. M) Treating RN: [name] Primary Care Physician: [name] Other Clinician: Referring Physician: [name] Treating Physician/Extender: Sharalyn Ink in Treatment: 1 Education Assessment Education Provided To: Patient and Caregiver Education Topics Provided Wound/Skin Impairment: Handouts: Other: wound care as ordered Methods: Demonstration, Explain/Verbal Responses: State content correctly Electronic Signature(s) Signed: 08/18/2019 4:36:26 PM By: [name] Entered By: [name] on 08/18/2019 16:10:44  Eduardo, [name] Dennis. ([id number]) -------------------------------------------------------------------------------- Wound Assessment Details Patient Name: Eduardo [name], Eduardo [name]. Date of Service: 08/18/2019 3:45 PM Medical Record Number:  [id number] Patient Account Number: [id number] Date of Birth/Sex: 11/08/1930 (86 y.o. M) Treating RN: Army Melia Primary Care Ferrel Simington: [name] Other Clinician: Referring Paislee Szatkowski: [name] Treating Promise Bushong/Extender: Melburn Hake, HOYT Weeks in Treatment: 1 Wound Status Wound Number: 2 Primary Skin Tear Etiology: Wound Location: Right Elbow Wound Open Wounding Event: Trauma Status: Date Acquired: 07/29/2019 Comorbid Glaucoma, Chronic Obstructive Pulmonary Disease (COPD), Weeks Of Treatment: 1 History: Arrhythmia, Congestive Heart Failure, Coronary Artery Clustered Wound: No Disease, Myocardial Infarction, Neuropathy Photos Wound Measurements Length: (cm) 3.2 Width: (cm) 2.2 Depth: (cm) 0.7 Area: (cm) 5.529 Volume: (cm) 3.87 % Reduction in Area: 69.5% % Reduction in Volume: -113.3% Epithelialization: None Tunneling: No Undermining: No Wound Description Classification: Full Thickness Without Exposed Support Structu Exudate Amount: Medium Exudate Type: Serosanguineous Exudate Color: red, brown res Foul Odor After Cleansing: No Slough/Fibrino Yes Wound Bed Granulation Amount: Medium (34-66%) Exposed Structure Granulation Quality: Red Fascia Exposed: No Necrotic Amount: Medium (34-66%) Fat Layer (Subcutaneous Tissue) Exposed: Yes Necrotic Quality: Eschar, Adherent Slough Tendon Exposed: No Muscle Exposed: No Joint Exposed: No Bone Exposed: No Treatment Notes Wound #2 (Right Elbow) Notes Silvercell, Abd, conform, ace wrap Electronic Signature(s) Signed: 08/19/2019 8:09:31 AM By: Eduardo [name], [name] ([id number]) Entered By: Army Melia on 08/18/2019 15:50:02 Eduardo [name], [name] ([id number]) -------------------------------------------------------------------------------- Vitals Details Patient Name: Eduardo [name]. Date of Service: 08/18/2019 3:45 PM Medical Record Number: [id number] Patient Account Number: [id number] Date of  Birth/Sex: Sep 14, 1930 (86 y.o. M) Treating RN: [name] Primary Care Katasha Riga: [name] Other Clinician: Referring Sreekar Broyhill: [name] Treating Larae Caison/Extender: Melburn Hake, HOYT Weeks in Treatment: 1 Vital Signs Time Taken: 15:40 Temperature (F): 97.9 Height (in): 62 Pulse (bpm): 77 Weight (lbs): 141 Respiratory Rate (breaths/min): 18 Body Mass Index (BMI): 25.8 Blood Pressure (mmHg): 161/74 Reference Range: 80 - 120 mg / dl Electronic Signature(s) Signed: 08/18/2019 4:21:39 PM By: Lorine Bears RCP, RRT, CHT Entered By: Lorine Bears on 08/18/2019 15:43:39

## 2019-08-20 DIAGNOSIS — F411 Generalized anxiety disorder: Secondary | ICD-10-CM | POA: Diagnosis not present

## 2019-08-20 DIAGNOSIS — F39 Unspecified mood [affective] disorder: Secondary | ICD-10-CM | POA: Diagnosis not present

## 2019-08-21 NOTE — Patient Instructions (Addendum)
Dear Eduardo [name],  Below is a summary of the goals we discussed at our follow up visit on 08/11/19. Please contact me anytime with questions or concerns.   Visit Information  Goals    . Pharmacy Care Plan     Current Barriers:  . Chronic Disease Management support, education, and care coordination needs related to hypertension, COPD, heart failure  Pharmacist Clinical Goal(s):  . Review medications for possible discontinuation. Due to history of gastrointestinal bleed, it is best to continue pantoprazole at current dose every other day. . Maintain blood pressure within goal of less than 140/90 mmHg. Recommend obtaining a new home blood pressure monitor with arm cuff. Continue current medications for now. . Ensure medications are not hindering balance. Recommend monitoring closely for low blood pressure. Also recommend discussing balance concerns with psychiatrist. Avoid over the counter sleep-aids such as doxylamine and diphenhydramine.  . Review iron lab work and assess. Will discuss resuming iron with Dr. [name].  Interventions: . Comprehensive medication review performed.  Patient Self Care Activities:  . Patient verbalizes understanding of plan to return to clinic for blood pressure monitor evaluation, Self administers medications as prescribed, and Calls pharmacy for medication refills  Please see past updates related to this goal by clicking on the "Past Updates" button in the selected goal        Print copy of patient instructions provided.  Telephone follow up appointment with pharmacy team member scheduled for: September 24, 2019 at 9:00 AM (telephone)  Debbora Dus, PharmD Clinical Pharmacist [facility] [phone]   Understanding Your Risk for Falls Each year, millions of people have serious injuries from falls. It is important to understand your risk for falling. Talk with your health care provider about your risk and what you can do to  lower it. There are actions you can take at home to lower your risk. If you do have a serious fall, make sure you tell your health care provider. Falling once raises your risk for falling again. How can falls affect me? Serious injuries from falls are common. These include:  Broken bones, such as hip fractures.  Head injuries, such as traumatic brain injuries (TBI). Fear of falling can also cause you to avoid activities and stay at home. This can make your muscles weaker and actually raise your risk for a fall. What can increase my risk? There are a number of risk factors that increase your risk for falling. The more risk factors you have, the higher your risk for falling. Serious injuries from a fall most often happen to people older than age 36. Children and young adults ages 41-29 are also at higher risk. Common risk factors include:  Weakness in the lower body.  Lack (deficiency) of vitamin D.  Being generally weak or confused due to long-term (chronic) illness.  Dizziness or balance problems.  Poor vision.  Medicines that cause dizziness or drowsiness. These can include medicines for your blood pressure, heart, anxiety, insomnia, or edema, as well as pain medicines and muscle relaxants. Other risk factors include:  Drinking alcohol.  Having had a fall in the past.  Having depression.  Foot pain or improper footwear.  Working at a dangerous job.  Having any of the following in your home: ? Tripping hazards, such as floor clutter or loose rugs. ? Poor lighting. ? Pets or clutter.  Dementia or memory loss. What actions can I take to lower my risk of falling?  Physical activity Maintain physical fitness. Do strength and balance exercises. Consider taking a regular class to build strength and balance. Yoga and tai chi are good options. Vision Have your eyes checked every year and your vision prescription updated as needed. Walking aids and footwear  Wear nonskid  shoes. Do not wear high heels.  Do not walk around the house in socks or slippers.  Use a cane or walker as told by your health care provider. Home safety  Attach secure railings on both sides of your stairs.  Install grab bars for your tub, shower, and toilet. Use a bath mat in your tub or shower.  Use good lighting in all rooms. Keep a flashlight near your bed.  Make sure there is a clear path from your bed to the bathroom. Use night-lights.  Do not use throw rugs. Make sure all carpeting is taped or tacked down securely.  Remove all clutter from walkways and stairways, including extension cords.  Repair uneven or broken steps.  Avoid walking on icy or slippery surfaces. Walk on the grass instead of on icy or slick sidewalks. Where you can, use ice melt to get rid of ice on walkways.  Use a cordless phone. Questions to ask your health care provider  Can you help me check my risk for a fall?  Do any of my medicines make me more likely to fall?  Should I take a vitamin D supplement?  What exercises can I do to improve my strength and balance?  Should I make an appointment to have my vision checked?  Do I need a bone density test to check for weak bones or osteoporosis?  Would it help to use a cane or a walker? Where to find more information  Centers for Disease Control and Prevention, STEADI: http://www.wolf.info/  Community-Based Fall Prevention Programs: http://www.wolf.info/  National Institute on Aging: ToneConnect.com.ee Contact a health care provider if:  You fall at home.  You are afraid of falling at home.  You feel weak, drowsy, or dizzy. Summary  People 40 and older are at high risk for falling. However, older people are not the only ones injured in falls. Children and young adults have a higher-than-normal risk too.  Talk with your health care provider about your risks for falling and how to lower those risks.  Taking certain precautions at home can lower your risk  for falling.  If you fall, always tell your health care provider. This information is not intended to replace advice given to you by your health care provider. Make sure you discuss any questions you have with your health care provider. Document Revised: 12/03/2018 Document Reviewed: 12/03/2018 Elsevier Patient Education  [place].

## 2019-08-25 ENCOUNTER — Other Ambulatory Visit: Payer: Self-pay

## 2019-08-25 ENCOUNTER — Encounter: Payer: PPO | Admitting: Physician Assistant

## 2019-08-25 DIAGNOSIS — S40021D Contusion of right upper arm, subsequent encounter: Secondary | ICD-10-CM | POA: Diagnosis not present

## 2019-08-25 DIAGNOSIS — S40021A Contusion of right upper arm, initial encounter: Secondary | ICD-10-CM | POA: Diagnosis not present

## 2019-08-25 NOTE — Progress Notes (Signed)
HIEU, HERMS ([id number]) Visit Report for 08/25/2019 Arrival Information Details Patient Name: Eduardo [name], Eduardo [name]. Date of Service: 08/25/2019 11:00 AM Medical Record Number: [id number] Patient Account Number: [id number] Date of Birth/Sex: [birth year] (86 y.o. M) Treating RN: Army Melia Primary Care Jamine Highfill: [name] Other Clinician: Referring Ky Moskowitz: [name] Treating Hanin Decook/Extender: Melburn Hake, HOYT Weeks in Treatment: 2 Visit Information History Since Last Visit Added or deleted any medications: No Patient Arrived: Walker Any new allergies or adverse reactions: No Arrival Time: 10:57 Had a fall or experienced change in No Accompanied By: wife activities of daily living that may affect Transfer Assistance: None risk of falls: Patient Identification Verified: Yes Signs or symptoms of abuse/neglect since last visito No Hospitalized since last visit: No Has Dressing in Place as Prescribed: Yes Pain Present Now: No Electronic Signature(s) Signed: 08/25/2019 2:56:13 PM By: Army Melia Entered By: Army Melia on 08/25/2019 10:58:06 Atkison, Yancy M. ([id number]) -------------------------------------------------------------------------------- Clinic Level of Care Assessment Details Patient Name: Eduardo [name]. Date of Service: 08/25/2019 11:00 AM Medical Record Number: [id number] Patient Account Number: [id number] Date of Birth/Sex: 06-Jun-1931 (86 y.o. M) Treating RN: [name] Primary Care Micaila Ziemba: [name] Other Clinician: Referring Cordai Rodrigue: [name] Treating Mendell Bontempo/Extender: Melburn Hake, HOYT Weeks in Treatment: 2 Clinic Level of Care Assessment Items TOOL 4 Quantity Score []  - Use when only an EandM is performed on FOLLOW-UP visit 0 ASSESSMENTS - Nursing Assessment / Reassessment X - Reassessment of Co-morbidities (includes updates in patient status) 1 10 X- 1 5 Reassessment of Adherence to Treatment  Plan ASSESSMENTS - Wound and Skin Assessment / Reassessment X - Simple Wound Assessment / Reassessment - one wound 1 5 []  - 0 Complex Wound Assessment / Reassessment - multiple wounds []  - 0 Dermatologic / Skin Assessment (not related to wound area) ASSESSMENTS - Focused Assessment []  - Circumferential Edema Measurements - multi extremities 0 []  - 0 Nutritional Assessment / Counseling / Intervention []  - 0 Lower Extremity Assessment (monofilament, tuning fork, pulses) []  - 0 Peripheral Arterial Disease Assessment (using hand held doppler) ASSESSMENTS - Ostomy and/or Continence Assessment and Care []  - Incontinence Assessment and Management 0 []  - 0 Ostomy Care Assessment and Management (repouching, etc.) PROCESS - Coordination of Care X - Simple Patient / Family Education for ongoing care 1 15 []  - 0 Complex (extensive) Patient / Family Education for ongoing care X- 1 10 Staff obtains Programmer, systems, Records, Test Results / Process Orders []  - 0 Staff telephones HHA, Nursing Homes / Clarify orders / etc []  - 0 Routine Transfer to another Facility (non-emergent condition) []  - 0 Routine Hospital Admission (non-emergent condition) []  - 0 New Admissions / Biomedical engineer / Ordering NPWT, Apligraf, etc. []  - 0 Emergency Hospital Admission (emergent condition) X- 1 10 Simple Discharge Coordination []  - 0 Complex (extensive) Discharge Coordination PROCESS - Special Needs []  - Pediatric / Minor Patient Management 0 []  - 0 Isolation Patient Management []  - 0 Hearing / Language / Visual special needs []  - 0 Assessment of Community assistance (transportation, D/C planning, etc.) Weisberg, [name] ([id number]) []  - 0 Additional assistance / Altered mentation []  - 0 Support Surface(s) Assessment (bed, cushion, seat, etc.) INTERVENTIONS - Wound Cleansing / Measurement X - Simple Wound Cleansing - one wound 1 5 []  - 0 Complex Wound Cleansing - multiple wounds X- 1  5 Wound Imaging (photographs - any number of wounds) []  - 0 Wound Tracing (instead of photographs) X- 1 5 Simple Wound Measurement - one wound []  -  0 Complex Wound Measurement - multiple wounds INTERVENTIONS - Wound Dressings X - Small Wound Dressing one or multiple wounds 1 10 []  - 0 Medium Wound Dressing one or multiple wounds []  - 0 Large Wound Dressing one or multiple wounds []  - 0 Application of Medications - topical []  - 0 Application of Medications - injection INTERVENTIONS - Miscellaneous []  - External ear exam 0 []  - 0 Specimen Collection (cultures, biopsies, blood, body fluids, etc.) []  - 0 Specimen(s) / Culture(s) sent or taken to Lab for analysis []  - 0 Patient Transfer (multiple staff / Civil Service fast streamer / Similar devices) []  - 0 Simple Staple / Suture removal (25 or less) []  - 0 Complex Staple / Suture removal (26 or more) []  - 0 Hypo / Hyperglycemic Management (close monitor of Blood Glucose) []  - 0 Ankle / Brachial Index (ABI) - do not check if billed separately X- 1 5 Vital Signs Has the patient been seen at the hospital within the last three years: Yes Total Score: 85 Level Of Care: New/Established - Level 3 Electronic Signature(s) Signed: 08/25/2019 2:58:07 PM By: [name] Entered By: [name] on 08/25/2019 11:13:28 [place], Eduardo [name] ([id number]) -------------------------------------------------------------------------------- Encounter Discharge Information Details Patient Name: Eduardo [name]. Date of Service: 08/25/2019 11:00 AM Medical Record Number: [id number] Patient Account Number: [id number] Date of Birth/Sex: [birth year] (86 y.o. M) Treating RN: [name] Primary Care Kamaria Lucia: [name] Other Clinician: Referring Aanika Defoor: [name] Treating Cadell Gabrielson/Extender: Melburn Hake, HOYT Weeks in Treatment: 2 Encounter Discharge Information Items Discharge Condition: Stable Ambulatory Status: Walker Discharge  Destination: Home Transportation: Private Auto Accompanied By: spouse Schedule Follow-up Appointment: Yes Clinical Summary of Care: Electronic Signature(s) Signed: 08/25/2019 2:58:07 PM By: [name] Entered By: [name] on 08/25/2019 11:14:22 Callan, Eduardo [name] ([id number]) -------------------------------------------------------------------------------- Lower Extremity Assessment Details Patient Name: Eduardo [name]. Date of Service: 08/25/2019 11:00 AM Medical Record Number: [id number] Patient Account Number: [id number] Date of Birth/Sex: [birth year] (86 y.o. M) Treating RN: Army Melia Primary Care Broadus Costilla: [name] Other Clinician: Referring Croix Presley: [name] Treating Erian Lariviere/Extender: Melburn Hake, HOYT Weeks in Treatment: 2 Electronic Signature(s) Signed: 08/25/2019 2:56:13 PM By: Army Melia Entered By: Army Melia on 08/25/2019 11:04:47 Tafolla, [name] ([id number]) -------------------------------------------------------------------------------- Multi Wound Chart Details Patient Name: Eduardo [name]. Date of Service: 08/25/2019 11:00 AM Medical Record Number: [id number] Patient Account Number: [id number] Date of Birth/Sex: 1931-05-31 (86 y.o. M) Treating RN: [name] Primary Care Peggyann Zwiefelhofer: [name] Other Clinician: Referring Vickie Melnik: [name] Treating Elonzo Sopp/Extender: Melburn Hake, HOYT Weeks in Treatment: 2 Vital Signs Height(in): 62 Pulse(bpm): 51 Weight(lbs): 141 Blood Pressure(mmHg): 140/58 Body Mass Index(BMI): 26 Temperature(F): 97.7 Respiratory Rate(breaths/min): 16 Photos: [N/A:N/A] Wound Location: Right Elbow N/A N/A Wounding Event: Trauma N/A N/A Primary Etiology: Skin Tear N/A N/A Comorbid History: Glaucoma, Chronic Obstructive N/A N/A Pulmonary Disease (COPD), Arrhythmia, Congestive Heart Failure, Coronary Artery Disease, Myocardial Infarction, Neuropathy Date Acquired: 07/29/2019 N/A  N/A Weeks of Treatment: 2 N/A N/A Wound Status: Open N/A N/A Measurements L x W x D (cm) 3x1.5x0.4 N/A N/A Area (cm) : 3.534 N/A N/A Volume (cm) : 1.414 N/A N/A % Reduction in Area: 80.50% N/A N/A % Reduction in Volume: 22.10% N/A N/A Starting Position 1 (o'clock): 8 Ending Position 1 (o'clock): 1 Maximum Distance 1 (cm): 0.5 Undermining: Yes N/A N/A Classification: Full Thickness Without Exposed N/A N/A Support Structures Exudate Amount: Medium N/A N/A Exudate Type: Serosanguineous N/A N/A Exudate Color: red, brown N/A N/A Granulation Amount: Medium (34-66%) N/A N/A Granulation Quality: Red  N/A N/A Necrotic Amount: Medium (34-66%) N/A N/A Necrotic Tissue: Eschar, Adherent Slough N/A N/A Exposed Structures: Fat Layer (Subcutaneous Tissue) N/A N/A Exposed: Yes Fascia: No Tendon: No Muscle: No Joint: No Bone: No Epithelialization: None N/A N/A Eduardo [name], Eduardo [name] ([id number]) Treatment Notes Electronic Signature(s) Signed: 08/25/2019 2:58:07 PM By: [name] Entered By: [name] on 08/25/2019 11:09:59 [place], Eduardo [name] ([id number]) -------------------------------------------------------------------------------- [place] Details Patient Name: Eduardo [name]. Date of Service: 08/25/2019 11:00 AM Medical Record Number: [id number] Patient Account Number: [id number] Date of Birth/Sex: 1930-12-31 (86 y.o. M) Treating RN: [name] Primary Care Layci Stenglein: [name] Other Clinician: Referring Luba Matzen: [name] Treating Lajune Perine/Extender: Melburn Hake, HOYT Weeks in Treatment: 2 Active Inactive Abuse / Safety / Falls / Self Care Management Nursing Diagnoses: History of Falls Goals: Patient will not experience any injury related to falls Date Initiated: 08/05/2019 Target Resolution Date: 08/19/2019 Goal Status: Active Interventions: Assess fall risk on admission and as needed Notes: Necrotic Tissue Nursing  Diagnoses: Impaired tissue integrity related to necrotic/devitalized tissue Goals: Necrotic/devitalized tissue will be minimized in the wound bed Date Initiated: 08/05/2019 Target Resolution Date: 08/19/2019 Goal Status: Active Interventions: Assess patient pain level pre-, during and post procedure and prior to discharge Treatment Activities: Apply topical anesthetic as ordered : 08/05/2019 Notes: Orientation to the Wound Care Program Nursing Diagnoses: Knowledge deficit related to the wound healing center program Goals: Patient/caregiver will verbalize understanding of the [facility] Date Initiated: 08/05/2019 Target Resolution Date: 08/19/2019 Goal Status: Active Interventions: Provide education on orientation to the wound center Notes: Pain, Acute or Chronic Eduardo [name], [name] ([id number]) Nursing Diagnoses: Potential alteration in comfort, pain Goals: Patient will verbalize adequate pain control and receive pain control interventions during procedures as needed Date Initiated: 08/05/2019 Target Resolution Date: 08/19/2019 Goal Status: Active Interventions: Assess comfort goal upon admission Complete pain assessment as per visit requirements Notes: Wound/Skin Impairment Nursing Diagnoses: Impaired tissue integrity Goals: Patient/caregiver will verbalize understanding of skin care regimen Date Initiated: 08/05/2019 Target Resolution Date: 08/19/2019 Goal Status: Active Ulcer/skin breakdown will have a volume reduction of 30% by week 4 Date Initiated: 08/05/2019 Target Resolution Date: 09/02/2019 Goal Status: Active Interventions: Assess patient/caregiver ability to obtain necessary supplies Assess ulceration(s) every visit Treatment Activities: Patient referred to home care : 08/05/2019 Notes: Electronic Signature(s) Signed: 08/25/2019 2:58:07 PM By: [name] Entered By: [name] on 08/25/2019 11:09:48 Eduardo [name], [name]  ([id number]) -------------------------------------------------------------------------------- Pain Assessment Details Patient Name: Eduardo [name]. Date of Service: 08/25/2019 11:00 AM Medical Record Number: [id number] Patient Account Number: [id number] Date of Birth/Sex: 08-08-1930 (86 y.o. M) Treating RN: Army Melia Primary Care Jiayi Lengacher: [name] Other Clinician: Referring Jye Fariss: [name] Treating Alexa Blish/Extender: Melburn Hake, HOYT Weeks in Treatment: 2 Active Problems Location of Pain Severity and Description of Pain Patient Has Paino No Site Locations Pain Management and Medication Current Pain Management: Electronic Signature(s) Signed: 08/25/2019 2:56:13 PM By: Army Melia Entered By: Army Melia on 08/25/2019 11:01:09 Eduardo [name], [name] ([id number]) -------------------------------------------------------------------------------- Patient/Caregiver Education Details Patient Name: Eduardo [name]. Date of Service: 08/25/2019 11:00 AM Medical Record Number: [id number] Patient Account Number: [id number] Date of Birth/Gender: 1931/03/11 (86 y.o. M) Treating RN: [name] Primary Care Physician: [name] Other Clinician: Referring Physician: [name] Treating Physician/Extender: Eduardo [name] in Treatment: 2 Education Assessment Education Provided To: Patient and Caregiver Education Topics Provided Wound/Skin Impairment: Handouts: Other: wound care as ordered Methods: Demonstration, Explain/Verbal Responses: State content correctly Electronic Signature(s) Signed: 08/25/2019 2:58:07 PM By:  Dorthy, Di Kindle Entered By: [name] on 08/25/2019 11:13:48 Eduardo [name], Eduardo [name] Jerilynn [name] ([id number]) -------------------------------------------------------------------------------- Wound Assessment Details Patient Name: BAER, HINTON. Date of Service: 08/25/2019 11:00 AM Medical Record Number: [id number] Patient Account Number:  [id number] Date of Birth/Sex: 1931-04-08 (86 y.o. M) Treating RN: Army Melia Primary Care Trachelle Low: [name] Other Clinician: Referring Amra Shukla: [name] Treating Lianni Kanaan/Extender: Melburn Hake, HOYT Weeks in Treatment: 2 Wound Status Wound Number: 2 Primary Skin Tear Etiology: Wound Location: Right Elbow Wound Open Wounding Event: Trauma Status: Date Acquired: 07/29/2019 Comorbid Glaucoma, Chronic Obstructive Pulmonary Disease (COPD), Weeks Of Treatment: 2 History: Arrhythmia, Congestive Heart Failure, Coronary Artery Clustered Wound: No Disease, Myocardial Infarction, Neuropathy Photos Wound Measurements Length: (cm) 3 Width: (cm) 1.5 Depth: (cm) 0.4 Area: (cm) 3.534 Volume: (cm) 1.414 % Reduction in Area: 80.5% % Reduction in Volume: 22.1% Epithelialization: None Undermining: Yes Starting Position (o'clock): 8 Ending Position (o'clock): 1 Maximum Distance: (cm) 0.5 Wound Description Classification: Full Thickness Without Exposed Support Structu Exudate Amount: Medium Exudate Type: Serosanguineous Exudate Color: red, brown res Foul Odor After Cleansing: No Slough/Fibrino Yes Wound Bed Granulation Amount: Medium (34-66%) Exposed Structure Granulation Quality: Red Fascia Exposed: No Necrotic Amount: Medium (34-66%) Fat Layer (Subcutaneous Tissue) Exposed: Yes Necrotic Quality: Eschar, Adherent Slough Tendon Exposed: No Muscle Exposed: No Joint Exposed: No Bone Exposed: No Treatment Notes Wound #2 (Right Elbow) Notes prisma, Abd, conform, ace wrap Manseau, Raymir M. ([id number]) Electronic Signature(s) Signed: 08/25/2019 2:56:13 PM By: Army Melia Entered By: Army Melia on 08/25/2019 11:04:28 Pesch, Tayjon M. ([id number]) -------------------------------------------------------------------------------- Vitals Details Patient Name: Eduardo [name]. Date of Service: 08/25/2019 11:00 AM Medical Record Number: [id number] Patient  Account Number: [id number] Date of Birth/Sex: 04/06/1931 (86 y.o. M) Treating RN: Army Melia Primary Care Gurveer Colucci: [name] Other Clinician: Referring Zayyan Mullen: [name] Treating Navea Woodrow/Extender: Melburn Hake, HOYT Weeks in Treatment: 2 Vital Signs Time Taken: 10:58 Temperature (F): 97.7 Height (in): 62 Pulse (bpm): 71 Weight (lbs): 141 Respiratory Rate (breaths/min): 16 Body Mass Index (BMI): 25.8 Blood Pressure (mmHg): 140/58 Reference Range: 80 - 120 mg / dl Electronic Signature(s) Signed: 08/25/2019 2:56:13 PM By: Army Melia Entered By: Army Melia on 08/25/2019 11:01:01

## 2019-08-25 NOTE — Progress Notes (Addendum)
Eduardo, [name] ([id number]) Visit Report for 08/25/2019 Chief Complaint Document Details Patient Name: Eduardo [name], Eduardo [name]. Date of Service: 08/25/2019 11:00 AM Medical Record Number: [id number] Patient Account Number: [id number] Date of Birth/Sex: [birth year] (86 y.o. [name]) Treating RN: [name] Primary Care Provider: [name] Other Clinician: Referring Provider: [name] Treating Provider/Extender: Melburn Hake, Ziona Wickens Weeks in Treatment: 2 Information Obtained from: Patient Chief Complaint Right lower leg ulcer due to trauma 08/05/2019; patient is here for review of a hematoma and skin tear on the lateral part of the right elbow Electronic Signature(s) Signed: 08/25/2019 11:07:18 AM By: Worthy Keeler PA-C Entered By: Worthy Keeler on 08/25/2019 11:07:18 Eduardo [name], Eduardo [name]. ([id number]) -------------------------------------------------------------------------------- HPI Details Patient Name: Eduardo [name]. Date of Service: 08/25/2019 11:00 AM Medical Record Number: [id number] Patient Account Number: [id number] Date of Birth/Sex: 1930-12-27 (86 y.o. [name]) Treating RN: [name] Primary Care Provider: [name] Other Clinician: Referring Provider: [name] Treating Provider/Extender: Melburn Hake, Edith Lord Weeks in Treatment: 2 History of Present Illness HPI Description: 11/08/17 on evaluation today patient presents initially concerning a right dramatic lower extremity ulcer/skin tear which occurred three weeks ago. He states that he initially was seen in urgent care where the area was sutured closed. The sutures stayed in for two weeks he tells me. Subsequently he was seen at Dr. Alla German office where the sutures were removed and Steri-Strips were placed over the next five days. Subsequently these were also removed and unfortunately at this time it was noted that the patient had a dehisced larger wound that required further attention. The patient and his  wife actually requested referral to the wound care center in [place]. [name] made the referral to [place]. Subsequently the patient does have a history of atrial fibrillation, cardiovascular disease, COPD, stroke, and is on long-term Eliquis. There's no history of diabetes that he states. Subsequently he has no other major medical problems currently. No fevers, chills, nausea, or vomiting noted at this time. 11/14/17-He is seen in follow-up evaluation for right posterior lower extremity ulcer. There is improvement with granulation tissue present; debridement. We will continue with same treatment plan he'll follow-up next week. The vascular office did call to establish an appointment, but he forgot that we had referred him and did not make an appointment. He was encouraged to contact the office to schedule an appointment for evaluation. 11/22/17 on evaluation today patient actually appears to be doing well in regard to his ulcer. This has cleaned up very nicely in my pinion I do believe the Iodoflex has been of benefit for him. He actually does have his arterial study which is scheduled for 11/27/17. In general he's having no significant discomfort mainly just with cleansing of the wound but the dressings themselves don't seem to be causing him problems. 11/29/17 on evaluation today patient appears to be doing very well in regard to his lower extremity ulcer. With that being said he unfortunately prior to coming in for evaluation today did have a trip over a world where he failed hurting his back and striking his head. With that being said he has not had any altered mental status since that time and states that he's not really any more dizzy and there does not appear to be any residual effect from this. He also does not have a significant headache. With that being said this is something I did check out for him today briefly at least since he was in our office. I do think that he needs to  look out for any signs or  symptoms of anything worsening such as nausea, vomiting, fever, chills, confusion, or severe headaches. He has no visual disturbance at this point. 12/06/17 on evaluation today patient appears to be doing excellent in regard to his lower extremity ulcer. This is starting to really fill in which is good news as well. We do have the results of the arterial study unfortunately they did not perform the TBI portion of the examination and his ABI's were noncompressible. With that being said they did state that he had triphasic blood flow throughout the bilateral lower extremities and there was apparently no evidence of arterial disease on the arterial ultrasound. Overall this is good news we will be able to initiate stronger compression therapy which should help to allow this wound to heal even faster as the patient still continues to have some pitting edema. 12/10/17 on evaluation today patient appears to be doing much better in regard to his lower extremity ulcer. He has been tolerating the dressing changes without complication. Fortunately the compression wrap seems to be doing excellent at this time and overall he is showing signs of good improvement. I'[name] happy with the overall progress that has been made over the past several weeks but especially in the past week. The patient and his wife both are extremely pleased. 12/18/17; right posterior calf ulcer. Healthy granulation. Dimension slightly smaller. Using silver collagen under compression 12/24/17 on evaluation today patient presents for follow-up concerning his ongoing right lower extremity ulcer. He has been tolerating the dressing changes without complication. Fortunately he seems to be doing excellent at this point. There does not appear to be any evidence of infection. 12/31/17 on evaluation today patient appears to be doing excellent in regard to his right lower extremity ulcer. He has been tolerating the dressing changes without complication. The  wrap appears to have been very beneficial for him up to this point. With that being said he shows no signs of infection at this time and overall I'[name] very pleased with how things seem to be progressing. 01/07/18 on evaluation today patient actually appears to be doing excellent in regard to his ulcer. In fact there was a lot of dried dressing material noted covering the wound during evaluation today. Once I begin to remove this utilizing a curette I was noted that the patient actually had significant epithelialization underlying this event was just a very small region that appears to still be open otherwise the remainder appears to be closed. Obviously this is excellent news and I think he is progressing very nicely. 01/14/18-he is seen in follow-up evaluation. There is dried dressing which was removed to reveal an epithelialized wound. He will be discharged from services today [place] 08/05/2019 This is a somewhat frail 86 year old man accompanied by his wife. He was in this clinic in 2009 with a traumatic right lower extremity wound. He comes in this time having suffered a fall on 07/07/2019 primary physician noted a hematoma and x-ray of the arm was negative. It was a bit difficult difficult to figure this out but looking through home health link however his wife says this area actually closed over and the hematoma resolved however he had a second fall on 07/29/2019 with a more substantial hematoma involving the lateral aspect of the right elbow a skin tear over this area and a substantial amount of bleeding in the posterior upper humerus area. Again an x-ray was negative. He has been applying Vaseline gauze twice daily with the help  of his wife. The wound was debrided by primary care on 07/31/2019. Eduardo [name], Eduardo [name] ([id number]) His wife states that she is overwhelmed with the idea of having to care for this wound and request home health. And is much as a can guarantee anything these days with  home health we will try to get somebody out to help her with the dressing. The idea of stopping the Eliquis that he is on for chronic atrial fibrillation in an elderly man who falls has been brought up before and apparently was done once with the assistance of Dr. Tamala Julian his cardiologist in [place] however he apparently had a mini stroke shortly thereafter so he is back on Eliquis. He apparently has gait instability he has a walker Past medical history; atrial fibrillation on Eliquis COPD, history of cerebrovascular disease, hypertension, diastolic heart failure COPD and coronary artery disease 08/12/2019; this is a patient with a substantial hematoma over the lateral epicondyle of the right elbow. I left this intact last week and wrapped with compression however he arrives in clinic this week with 3 gaping holes in the epithelialization over the hematoma. I did not think any of this was going to be viable. I was hoping to keep this intact a little while longer however I do not think this is going to be the case. He required an extensive debridement silver alginate to the resultant wound 08/18/2019 upon evaluation today patient is seen by myself for the first time today regarding the hematoma and injury on his right elbow location where he has a skin tear as well. Unfortunately he still has some hematoma remaining in the proximal portion of the wound. Fortunately there is no signs of active infection at this time. No fevers, chills, nausea, vomiting, or diarrhea. With that being said he has not had any additional falls which is good news and overall it does look like this is getting better compared to what it was when he first came back into the clinic. There has been some concern over whether or not he should continue with the blood thinners secondary to the fact that again he keeps having falls and again this could be detrimental if he were to strike his head. Nonetheless I am not sure there is been a  consensus with his cardiologist yet as to whether or not the blood thinner should be stopped. 08/25/2019 upon evaluation today patient actually appears to be doing excellent in regard to his forearm ulcer. He has been tolerating the dressing changes without complication. Fortunately there is no signs of active infection at this time. No fevers, chills, nausea, vomiting, or diarrhea. Electronic Signature(s) Signed: 08/25/2019 1:10:22 PM By: Worthy Keeler PA-C Entered By: Worthy Keeler on 08/25/2019 13:10:21 Eduardo [name], Eduardo [name]. ([id number]) -------------------------------------------------------------------------------- Physical Exam Details Patient Name: Eduardo [name]. Date of Service: 08/25/2019 11:00 AM Medical Record Number: [id number] Patient Account Number: [id number] Date of Birth/Sex: Oct 13, 1930 (86 y.o. [name]) Treating RN: [name] Primary Care Provider: [name] Other Clinician: Referring Provider: [name] Treating Provider/Extender: STONE III, Rukia Mcgillivray Weeks in Treatment: 2 Constitutional Well-nourished and well-hydrated in no acute distress. Respiratory normal breathing without difficulty. Psychiatric this patient is able to make decisions and demonstrates good insight into disease process. Alert and Oriented x 3. pleasant and cooperative. Notes Wound did not require any sharp debridement in fact I attempted to avoid this due to the fact that he does bleed easily even without his anticoagulant therapy actively being taken. Nonetheless I was able to  mechanically debride away the slough from the surface of the wound patient tolerated that today. Post debridement wound bed appears to be doing excellent. Electronic Signature(s) Signed: 08/25/2019 1:10:42 PM By: Worthy Keeler PA-C Entered By: Worthy Keeler on 08/25/2019 13:10:42 Eduardo [name], Eduardo [name]. ([id number]) -------------------------------------------------------------------------------- Physician  Orders Details Patient Name: Eduardo [name]. Date of Service: 08/25/2019 11:00 AM Medical Record Number: [id number] Patient Account Number: [id number] Date of Birth/Sex: [birth year] (86 y.o. [name]) Treating RN: [name] Primary Care Provider: [name] Other Clinician: Referring Provider: [name] Treating Provider/Extender: Melburn Hake, Lillie Portner Weeks in Treatment: 2 Verbal / Phone Orders: No Diagnosis Coding ICD-10 Coding Code Description S40.021D Contusion of right upper arm, subsequent encounter T45.515S Adverse effect of anticoagulants, sequela Wound Cleansing Wound #2 Right Elbow o Clean wound with Normal Saline. Anesthetic (add to Medication List) Wound #2 Right Elbow o Topical Lidocaine 4% cream applied to wound bed prior to debridement (In Clinic Only). Primary Wound Dressing Wound #2 Right Elbow o Silver Collagen - moisten with saline Secondary Dressing Wound #2 Right Elbow o ABD and Kerlix/Conform - Ace wrap to secure Dressing Change Frequency Wound #2 Right Elbow o Change Dressing Monday, Wednesday, Friday Follow-up Appointments Wound #2 Right Elbow o Return Appointment in 1 week. Additional Orders / Instructions Wound #2 Right Elbow o Activity as tolerated Home Health Wound #2 Right Elbow o D/C Home Health Services - Per patient request Electronic Signature(s) Signed: 08/25/2019 2:58:07 PM By: [name] Signed: 08/25/2019 3:07:58 PM By: Worthy Keeler PA-C Entered By: [name] on 08/25/2019 11:13:06 Eduardo [name], Eduardo [name]. ([id number]) -------------------------------------------------------------------------------- Problem List Details Patient Name: Eduardo [name], Eduardo [name]. Date of Service: 08/25/2019 11:00 AM Medical Record Number: [id number] Patient Account Number: [id number] Date of Birth/Sex: 29-Mar-1931 (86 y.o. [name]) Treating RN: [name] Primary Care Provider: [name] Other Clinician: Referring Provider:  [name] Treating Provider/Extender: Melburn Hake, Mysti Haley Weeks in Treatment: 2 Active Problems ICD-10 Evaluated Encounter Code Description Active Date Today Diagnosis S40.021D Contusion of right upper arm, subsequent encounter 08/05/2019 No Yes T45.515S Adverse effect of anticoagulants, sequela 08/05/2019 No Yes Inactive Problems Resolved Problems Electronic Signature(s) Signed: 08/25/2019 11:07:11 AM By: Worthy Keeler PA-C Entered By: Worthy Keeler on 08/25/2019 11:07:11 Eduardo [name], Eduardo [name]. ([id number]) -------------------------------------------------------------------------------- Progress Note Details Patient Name: Eduardo [name]. Date of Service: 08/25/2019 11:00 AM Medical Record Number: [id number] Patient Account Number: [id number] Date of Birth/Sex: May 19, 1931 (86 y.o. [name]) Treating RN: [name] Primary Care Provider: [name] Other Clinician: Referring Provider: [name] Treating Provider/Extender: Melburn Hake, Kahlie Deutscher Weeks in Treatment: 2 Subjective Chief Complaint Information obtained from Patient Right lower leg ulcer due to trauma 08/05/2019; patient is here for review of a hematoma and skin tear on the lateral part of the right elbow History of Present Illness (HPI) 11/08/17 on evaluation today patient presents initially concerning a right dramatic lower extremity ulcer/skin tear which occurred three weeks ago. He states that he initially was seen in urgent care where the area was sutured closed. The sutures stayed in for two weeks he tells me. Subsequently he was seen at Dr. Alla German office where the sutures were removed and Steri-Strips were placed over the next five days. Subsequently these were also removed and unfortunately at this time it was noted that the patient had a dehisced larger wound that required further attention. The patient and his wife actually requested referral to the wound care center in [place]. [name] made the referral to [place].  Subsequently the patient does have a  history of atrial fibrillation, cardiovascular disease, COPD, stroke, and is on long-term Eliquis. There's no history of diabetes that he states. Subsequently he has no other major medical problems currently. No fevers, chills, nausea, or vomiting noted at this time. 11/14/17-He is seen in follow-up evaluation for right posterior lower extremity ulcer. There is improvement with granulation tissue present; debridement. We will continue with same treatment plan he'll follow-up next week. The vascular office did call to establish an appointment, but he forgot that we had referred him and did not make an appointment. He was encouraged to contact the office to schedule an appointment for evaluation. 11/22/17 on evaluation today patient actually appears to be doing well in regard to his ulcer. This has cleaned up very nicely in my pinion I do believe the Iodoflex has been of benefit for him. He actually does have his arterial study which is scheduled for 11/27/17. In general he's having no significant discomfort mainly just with cleansing of the wound but the dressings themselves don't seem to be causing him problems. 11/29/17 on evaluation today patient appears to be doing very well in regard to his lower extremity ulcer. With that being said he unfortunately prior to coming in for evaluation today did have a trip over a world where he failed hurting his back and striking his head. With that being said he has not had any altered mental status since that time and states that he's not really any more dizzy and there does not appear to be any residual effect from this. He also does not have a significant headache. With that being said this is something I did check out for him today briefly at least since he was in our office. I do think that he needs to look out for any signs or symptoms of anything worsening such as nausea, vomiting, fever, chills, confusion, or severe  headaches. He has no visual disturbance at this point. 12/06/17 on evaluation today patient appears to be doing excellent in regard to his lower extremity ulcer. This is starting to really fill in which is good news as well. We do have the results of the arterial study unfortunately they did not perform the TBI portion of the examination and his ABI's were noncompressible. With that being said they did state that he had triphasic blood flow throughout the bilateral lower extremities and there was apparently no evidence of arterial disease on the arterial ultrasound. Overall this is good news we will be able to initiate stronger compression therapy which should help to allow this wound to heal even faster as the patient still continues to have some pitting edema. 12/10/17 on evaluation today patient appears to be doing much better in regard to his lower extremity ulcer. He has been tolerating the dressing changes without complication. Fortunately the compression wrap seems to be doing excellent at this time and overall he is showing signs of good improvement. I'[name] happy with the overall progress that has been made over the past several weeks but especially in the past week. The patient and his wife both are extremely pleased. 12/18/17; right posterior calf ulcer. Healthy granulation. Dimension slightly smaller. Using silver collagen under compression 12/24/17 on evaluation today patient presents for follow-up concerning his ongoing right lower extremity ulcer. He has been tolerating the dressing changes without complication. Fortunately he seems to be doing excellent at this point. There does not appear to be any evidence of infection. 12/31/17 on evaluation today patient appears to be doing excellent in  regard to his right lower extremity ulcer. He has been tolerating the dressing changes without complication. The wrap appears to have been very beneficial for him up to this point. With that being said he  shows no signs of infection at this time and overall I'[name] very pleased with how things seem to be progressing. 01/07/18 on evaluation today patient actually appears to be doing excellent in regard to his ulcer. In fact there was a lot of dried dressing material noted covering the wound during evaluation today. Once I begin to remove this utilizing a curette I was noted that the patient actually had significant epithelialization underlying this event was just a very small region that appears to still be open otherwise the remainder appears to be closed. Obviously this is excellent news and I think he is progressing very nicely. 01/14/18-he is seen in follow-up evaluation. There is dried dressing which was removed to reveal an epithelialized wound. He will be discharged from services today [place] 08/05/2019 This is a somewhat frail 86 year old man accompanied by his wife. He was in this clinic in 2009 with a traumatic right lower extremity wound. He comes Eduardo [name], Eduardo [name]. ([id number]) in this time having suffered a fall on 07/07/2019 primary physician noted a hematoma and x-ray of the arm was negative. It was a bit difficult difficult to figure this out but looking through home health link however his wife says this area actually closed over and the hematoma resolved however he had a second fall on 07/29/2019 with a more substantial hematoma involving the lateral aspect of the right elbow a skin tear over this area and a substantial amount of bleeding in the posterior upper humerus area. Again an x-ray was negative. He has been applying Vaseline gauze twice daily with the help of his wife. The wound was debrided by primary care on 07/31/2019. His wife states that she is overwhelmed with the idea of having to care for this wound and request home health. And is much as a can guarantee anything these days with home health we will try to get somebody out to help her with the dressing. The idea of  stopping the Eliquis that he is on for chronic atrial fibrillation in an elderly man who falls has been brought up before and apparently was done once with the assistance of Dr. Tamala Julian his cardiologist in [place] however he apparently had a mini stroke shortly thereafter so he is back on Eliquis. He apparently has gait instability he has a walker Past medical history; atrial fibrillation on Eliquis COPD, history of cerebrovascular disease, hypertension, diastolic heart failure COPD and coronary artery disease 08/12/2019; this is a patient with a substantial hematoma over the lateral epicondyle of the right elbow. I left this intact last week and wrapped with compression however he arrives in clinic this week with 3 gaping holes in the epithelialization over the hematoma. I did not think any of this was going to be viable. I was hoping to keep this intact a little while longer however I do not think this is going to be the case. He required an extensive debridement silver alginate to the resultant wound 08/18/2019 upon evaluation today patient is seen by myself for the first time today regarding the hematoma and injury on his right elbow location where he has a skin tear as well. Unfortunately he still has some hematoma remaining in the proximal portion of the wound. Fortunately there is no signs of active infection at this time. No fevers,  chills, nausea, vomiting, or diarrhea. With that being said he has not had any additional falls which is good news and overall it does look like this is getting better compared to what it was when he first came back into the clinic. There has been some concern over whether or not he should continue with the blood thinners secondary to the fact that again he keeps having falls and again this could be detrimental if he were to strike his head. Nonetheless I am not sure there is been a consensus with his cardiologist yet as to whether or not the blood thinner should be  stopped. 08/25/2019 upon evaluation today patient actually appears to be doing excellent in regard to his forearm ulcer. He has been tolerating the dressing changes without complication. Fortunately there is no signs of active infection at this time. No fevers, chills, nausea, vomiting, or diarrhea. Objective Constitutional Well-nourished and well-hydrated in no acute distress. Vitals Time Taken: 10:58 AM, Height: 62 in, Weight: 141 lbs, BMI: 25.8, Temperature: 97.7 F, Pulse: 71 bpm, Respiratory Rate: 16 breaths/min, Blood Pressure: 140/58 mmHg. Respiratory normal breathing without difficulty. Psychiatric this patient is able to make decisions and demonstrates good insight into disease process. Alert and Oriented x 3. pleasant and cooperative. General Notes: Wound did not require any sharp debridement in fact I attempted to avoid this due to the fact that he does bleed easily even without his anticoagulant therapy actively being taken. Nonetheless I was able to mechanically debride away the slough from the surface of the wound patient tolerated that today. Post debridement wound bed appears to be doing excellent. Integumentary (Hair, Skin) Wound #2 status is Open. Original cause of wound was Trauma. The wound is located on the Right Elbow. The wound measures 3cm length x 1.5cm width x 0.4cm depth; 3.534cm^2 area and 1.414cm^3 volume. There is Fat Layer (Subcutaneous Tissue) Exposed exposed. There is undermining starting at 8:00 and ending at 1:00 with a maximum distance of 0.5cm. There is a medium amount of serosanguineous drainage noted. There is medium (34-66%) red granulation within the wound bed. There is a medium (34-66%) amount of necrotic tissue within the wound bed including Eschar and Adherent Slough. Eduardo [name], Eduardo [name]. ([id number]) Assessment Active Problems ICD-10 Contusion of right upper arm, subsequent encounter Adverse effect of anticoagulants, sequela Plan Wound  Cleansing: Wound #2 Right Elbow: Clean wound with Normal Saline. Anesthetic (add to Medication List): Wound #2 Right Elbow: Topical Lidocaine 4% cream applied to wound bed prior to debridement (In Clinic Only). Primary Wound Dressing: Wound #2 Right Elbow: Silver Collagen - moisten with saline Secondary Dressing: Wound #2 Right Elbow: ABD and Kerlix/Conform - Ace wrap to secure Dressing Change Frequency: Wound #2 Right Elbow: Change Dressing Monday, Wednesday, Friday Follow-up Appointments: Wound #2 Right Elbow: Return Appointment in 1 week. Additional Orders / Instructions: Wound #2 Right Elbow: Activity as tolerated Home Health: Wound #2 Right Elbow: D/C Home Health Services - Per patient request 1. I would recommend currently that we go ahead and initiate treatment with a switch to a silver collagen dressing at this point I think this will do well as far as helping this area to heal more effectively and quickly. 2. Based on what I am seeing at this point I would recommend that we go ahead and continue with the Ace wrap which is keeping the swelling under control I think this is doing very well for him and also avoiding any adhesive to his skin. 3. I am also  can recommend that we discontinue wound care services as his wife states that she can pretty much take care of this on her own. We will see patient back for reevaluation in 1 week here in the clinic. If anything worsens or changes patient will contact our office for additional recommendations. Electronic Signature(s) Signed: 08/25/2019 1:15:47 PM By: Worthy Keeler PA-C Entered By: Worthy Keeler on 08/25/2019 13:15:47 [place], Tajay [name]. ([id number]) -------------------------------------------------------------------------------- SuperBill Details Patient Name: Eduardo [name]. Date of Service: 08/25/2019 Medical Record Number: [id number] Patient Account Number: [id number] Date of Birth/Sex: [birth year] (86 y.o.  [name]) Treating RN: [name] Primary Care Provider: [name] Other Clinician: Referring Provider: [name] Treating Provider/Extender: Melburn Hake, Shai Mckenzie Weeks in Treatment: 2 Diagnosis Coding ICD-10 Codes Code Description S40.021D Contusion of right upper arm, subsequent encounter T45.515S Adverse effect of anticoagulants, sequela Facility Procedures CPT4 Code: 20813887 Description: 99213 - WOUND CARE VISIT-LEV 3 EST PT Modifier: Quantity: 1 Physician Procedures CPT4 Code: 1959747 Description: 18550 - WC PHYS LEVEL 3 - EST PT Modifier: Quantity: 1 CPT4 Code: Description: ICD-10 Diagnosis Description S40.021D Contusion of right upper arm, subsequent encounter T45.515S Adverse effect of anticoagulants, sequela Modifier: Quantity: Electronic Signature(s) Signed: 08/25/2019 1:16:02 PM By: Worthy Keeler PA-C Entered By: Worthy Keeler on 08/25/2019 13:16:02

## 2019-08-26 ENCOUNTER — Ambulatory Visit: Payer: PPO | Admitting: Internal Medicine

## 2019-08-27 DIAGNOSIS — G8911 Acute pain due to trauma: Secondary | ICD-10-CM | POA: Diagnosis not present

## 2019-08-27 DIAGNOSIS — Z7901 Long term (current) use of anticoagulants: Secondary | ICD-10-CM | POA: Diagnosis not present

## 2019-08-27 DIAGNOSIS — R0781 Pleurodynia: Secondary | ICD-10-CM | POA: Diagnosis not present

## 2019-08-27 DIAGNOSIS — I2510 Atherosclerotic heart disease of native coronary artery without angina pectoris: Secondary | ICD-10-CM | POA: Diagnosis not present

## 2019-08-27 DIAGNOSIS — J449 Chronic obstructive pulmonary disease, unspecified: Secondary | ICD-10-CM | POA: Diagnosis not present

## 2019-08-27 DIAGNOSIS — F39 Unspecified mood [affective] disorder: Secondary | ICD-10-CM | POA: Diagnosis not present

## 2019-08-27 DIAGNOSIS — M48061 Spinal stenosis, lumbar region without neurogenic claudication: Secondary | ICD-10-CM | POA: Diagnosis not present

## 2019-08-27 DIAGNOSIS — I6523 Occlusion and stenosis of bilateral carotid arteries: Secondary | ICD-10-CM | POA: Diagnosis not present

## 2019-08-27 DIAGNOSIS — I110 Hypertensive heart disease with heart failure: Secondary | ICD-10-CM | POA: Diagnosis not present

## 2019-08-27 DIAGNOSIS — I051 Rheumatic mitral insufficiency: Secondary | ICD-10-CM | POA: Diagnosis not present

## 2019-08-27 DIAGNOSIS — I482 Chronic atrial fibrillation, unspecified: Secondary | ICD-10-CM | POA: Diagnosis not present

## 2019-08-27 DIAGNOSIS — M159 Polyosteoarthritis, unspecified: Secondary | ICD-10-CM | POA: Diagnosis not present

## 2019-08-27 DIAGNOSIS — I252 Old myocardial infarction: Secondary | ICD-10-CM | POA: Diagnosis not present

## 2019-08-27 DIAGNOSIS — K219 Gastro-esophageal reflux disease without esophagitis: Secondary | ICD-10-CM | POA: Diagnosis not present

## 2019-08-27 DIAGNOSIS — Z9181 History of falling: Secondary | ICD-10-CM | POA: Diagnosis not present

## 2019-08-27 DIAGNOSIS — S41101D Unspecified open wound of right upper arm, subsequent encounter: Secondary | ICD-10-CM | POA: Diagnosis not present

## 2019-08-27 DIAGNOSIS — Z87891 Personal history of nicotine dependence: Secondary | ICD-10-CM | POA: Diagnosis not present

## 2019-08-27 DIAGNOSIS — I5032 Chronic diastolic (congestive) heart failure: Secondary | ICD-10-CM | POA: Diagnosis not present

## 2019-08-31 ENCOUNTER — Ambulatory Visit: Payer: PPO | Admitting: Emergency Medicine

## 2019-08-31 ENCOUNTER — Encounter: Payer: Self-pay | Admitting: Emergency Medicine

## 2019-08-31 ENCOUNTER — Other Ambulatory Visit: Payer: Self-pay

## 2019-08-31 DIAGNOSIS — J439 Emphysema, unspecified: Secondary | ICD-10-CM | POA: Diagnosis not present

## 2019-08-31 NOTE — Progress Notes (Signed)
  Subjective:    Patient ID: RAYN ENDERSON, male    DOB: 08/23/1930, 86 y.o.   MRN: [id number] HPI  ROV 08/04/18 --87 year old gentleman, former smoker with CAD and atrial fibrillation.  Also COPD with upper airway instability and allergic rhinitis.  We have been managing him on Stiolto, Zyrtec, Atrovent nasal spray.  He began to have some difficulty earlier this month, describes increased cough with brownish mucous, orthopnea, increased LE edema.  He has been treated with prednisone, but then showed evidence of increased edema and continued dyspnea.  His Lasix was therefore increased from 20 to 40 mg and azithromycin was added.   ROV 01/05/2019 --Mr. Alpern is 67, has history of tobacco, coronary disease, atrial fibrillation.  We follow him for COPD as well as upper airway instability and cough in the setting of allergic rhinitis.  His most recent flare was in February, was treated with prednisone and azithromycin.  He has been chronically managed with Stiolto, has albuterol available to use at approximately once a day. He wants to have albuterol nebs available - has had difficulty getting it. Needs a new script.   He uses Zyrtec and Atrovent nasal spray, mucinex-DM. He is about to start rehab / PT for strength building. His activity level is down due to the isolation efforts. He has dyspnea with bending over, with heavier exertion like climbing 2 flights of stairs.   ROV 08/31/19 --86 year old man with a history of COPD as well as chronic cough due to upper airway instability and allergic rhinitis, drainage.  He is on Stiolto, uses albuterol a few times a month.  He has used Zyrtec, Atrovent nasal spray, Mucinex DM as needed. He has been having trouble with balance - had a fall and injured his R arm, had hematoma evacuation. He reports that his breathing has been . He still has nasal gtt, to his throat at night when he sleeps. No flares this winter.  He has had the COVID vaccine, tolerated well.      Review of Systems As per HPI    Objective:  Physical Exam Vitals:   08/31/19 1654  BP: (!) 145/66  Pulse: 85  Temp: 97.7 F (36.5 C)  TempSrc: Temporal  SpO2: 97%  Weight: 140 lb 6.4 oz (63.7 kg)  Height: 5\' 2"  (1.575 m)   Gen: Pleasant, well-nourished elderly man, in no distress,  normal affect  ENT: No lesions,  mouth clear,  oropharynx clear, no postnasal drip  Neck: No JVD, no stridor today  Lungs: No use of accessory muscles, distant, no wheeze  Cardiovascular: irregular, heart sounds normal, no murmur or gallops, no peripheral edema  Musculoskeletal: No deformities, no cyanosis or clubbing  Neuro: alert, non focal  Skin: Warm, no lesions or rashes   Assessment & Plan:  COPD GOLD II B Please continue Stiolto 2 puffs once daily as you have been taking it. Keep your albuterol available to use 2 puffs if needed for shortness of breath, chest tightness, wheezing. Please continue your Zyrtec and Atrovent nasal spray as you have been using it Keep Mucinex DM available to use as needed for congestion, cough, especially during the allergy season. Follow with Dr Lamonte Sakai in 6 months or sooner if you have any problems  Baltazar Apo, MD, PhD 29-Mar-2020, 1:05 PM [facility] [phone] or if no answer [phone]

## 2019-08-31 NOTE — Patient Instructions (Signed)
Please continue Stiolto 2 puffs once daily as you have been taking it. Keep your albuterol available to use 2 puffs if needed for shortness of breath, chest tightness, wheezing. Please continue your Zyrtec and Atrovent nasal spray as you have been using it Keep Mucinex DM available to use as needed for congestion, cough, especially during the allergy season. Follow with Dr Lamonte Sakai in 6 months or sooner if you have any problems

## 2019-09-01 ENCOUNTER — Encounter: Payer: PPO | Admitting: Physician Assistant

## 2019-09-01 ENCOUNTER — Other Ambulatory Visit: Payer: Self-pay

## 2019-09-01 DIAGNOSIS — I482 Chronic atrial fibrillation, unspecified: Secondary | ICD-10-CM | POA: Diagnosis not present

## 2019-09-01 DIAGNOSIS — I110 Hypertensive heart disease with heart failure: Secondary | ICD-10-CM | POA: Diagnosis not present

## 2019-09-01 DIAGNOSIS — F39 Unspecified mood [affective] disorder: Secondary | ICD-10-CM | POA: Diagnosis not present

## 2019-09-01 DIAGNOSIS — M48061 Spinal stenosis, lumbar region without neurogenic claudication: Secondary | ICD-10-CM | POA: Diagnosis not present

## 2019-09-01 DIAGNOSIS — S40021A Contusion of right upper arm, initial encounter: Secondary | ICD-10-CM | POA: Diagnosis not present

## 2019-09-01 DIAGNOSIS — Z9181 History of falling: Secondary | ICD-10-CM | POA: Diagnosis not present

## 2019-09-01 DIAGNOSIS — K219 Gastro-esophageal reflux disease without esophagitis: Secondary | ICD-10-CM | POA: Diagnosis not present

## 2019-09-01 DIAGNOSIS — S40021D Contusion of right upper arm, subsequent encounter: Secondary | ICD-10-CM | POA: Diagnosis not present

## 2019-09-01 DIAGNOSIS — G8911 Acute pain due to trauma: Secondary | ICD-10-CM | POA: Diagnosis not present

## 2019-09-01 DIAGNOSIS — J449 Chronic obstructive pulmonary disease, unspecified: Secondary | ICD-10-CM | POA: Diagnosis not present

## 2019-09-01 DIAGNOSIS — Z7901 Long term (current) use of anticoagulants: Secondary | ICD-10-CM | POA: Diagnosis not present

## 2019-09-01 DIAGNOSIS — R0781 Pleurodynia: Secondary | ICD-10-CM | POA: Diagnosis not present

## 2019-09-01 DIAGNOSIS — I051 Rheumatic mitral insufficiency: Secondary | ICD-10-CM | POA: Diagnosis not present

## 2019-09-01 DIAGNOSIS — I6523 Occlusion and stenosis of bilateral carotid arteries: Secondary | ICD-10-CM | POA: Diagnosis not present

## 2019-09-01 DIAGNOSIS — S41101D Unspecified open wound of right upper arm, subsequent encounter: Secondary | ICD-10-CM | POA: Diagnosis not present

## 2019-09-01 DIAGNOSIS — Z87891 Personal history of nicotine dependence: Secondary | ICD-10-CM | POA: Diagnosis not present

## 2019-09-01 DIAGNOSIS — I2510 Atherosclerotic heart disease of native coronary artery without angina pectoris: Secondary | ICD-10-CM | POA: Diagnosis not present

## 2019-09-01 DIAGNOSIS — M159 Polyosteoarthritis, unspecified: Secondary | ICD-10-CM | POA: Diagnosis not present

## 2019-09-01 DIAGNOSIS — I5032 Chronic diastolic (congestive) heart failure: Secondary | ICD-10-CM | POA: Diagnosis not present

## 2019-09-01 DIAGNOSIS — I252 Old myocardial infarction: Secondary | ICD-10-CM | POA: Diagnosis not present

## 2019-09-01 NOTE — Progress Notes (Addendum)
TRES, GRZYWACZ ([id number]) Visit Report for 09/01/2019 Chief Complaint Document Details Patient Name: Eduardo [name], Eduardo [name]. Date of Service: 09/01/2019 11:00 AM Medical Record Number: [id number] Patient Account Number: [id number] Date of Birth/Sex: 1930/09/02 (86 y.o. M) Treating RN: [name] Primary Care Provider: [name] Other Clinician: Referring Provider: [name] Treating Provider/Extender: Melburn Hake, Mollee Neer Weeks in Treatment: 3 Information Obtained from: Patient Chief Complaint Right lower leg ulcer due to trauma 08/05/2019; patient is here for review of a hematoma and skin tear on the lateral part of the right elbow Electronic Signature(s) Signed: 09/01/2019 11:17:12 AM By: Worthy Keeler PA-C Entered By: Worthy Keeler on 09/01/2019 11:17:11 Perillo, Manolo M. ([id number]) -------------------------------------------------------------------------------- HPI Details Patient Name: Eduardo [name]. Date of Service: 09/01/2019 11:00 AM Medical Record Number: [id number] Patient Account Number: [id number] Date of Birth/Sex: [birth year] (86 y.o. M) Treating RN: [name] Primary Care Provider: [name] Other Clinician: Referring Provider: [name] Treating Provider/Extender: Melburn Hake, Kordelia Severin Weeks in Treatment: 3 History of Present Illness HPI Description: 11/08/17 on evaluation today patient presents initially concerning a right dramatic lower extremity ulcer/skin tear which occurred three weeks ago. He states that he initially was seen in urgent care where the area was sutured closed. The sutures stayed in for two weeks he tells me. Subsequently he was seen at Dr. Alla German office where the sutures were removed and Steri-Strips were placed over the next five days. Subsequently these were also removed and unfortunately at this time it was noted that the patient had a dehisced larger wound that required further attention. The patient and his  wife actually requested referral to the wound care center in [place]. [name] made the referral to [place]. Subsequently the patient does have a history of atrial fibrillation, cardiovascular disease, COPD, stroke, and is on long-term Eliquis. There's no history of diabetes that he states. Subsequently he has no other major medical problems currently. No fevers, chills, nausea, or vomiting noted at this time. 11/14/17-He is seen in follow-up evaluation for right posterior lower extremity ulcer. There is improvement with granulation tissue present; debridement. We will continue with same treatment plan he'll follow-up next week. The vascular office did call to establish an appointment, but he forgot that we had referred him and did not make an appointment. He was encouraged to contact the office to schedule an appointment for evaluation. 11/22/17 on evaluation today patient actually appears to be doing well in regard to his ulcer. This has cleaned up very nicely in my pinion I do believe the Iodoflex has been of benefit for him. He actually does have his arterial study which is scheduled for 11/27/17. In general he's having no significant discomfort mainly just with cleansing of the wound but the dressings themselves don't seem to be causing him problems. 11/29/17 on evaluation today patient appears to be doing very well in regard to his lower extremity ulcer. With that being said he unfortunately prior to coming in for evaluation today did have a trip over a world where he failed hurting his back and striking his head. With that being said he has not had any altered mental status since that time and states that he's not really any more dizzy and there does not appear to be any residual effect from this. He also does not have a significant headache. With that being said this is something I did check out for him today briefly at least since he was in our office. I do think that he needs to  look out for any signs or  symptoms of anything worsening such as nausea, vomiting, fever, chills, confusion, or severe headaches. He has no visual disturbance at this point. 12/06/17 on evaluation today patient appears to be doing excellent in regard to his lower extremity ulcer. This is starting to really fill in which is good news as well. We do have the results of the arterial study unfortunately they did not perform the TBI portion of the examination and his ABI's were noncompressible. With that being said they did state that he had triphasic blood flow throughout the bilateral lower extremities and there was apparently no evidence of arterial disease on the arterial ultrasound. Overall this is good news we will be able to initiate stronger compression therapy which should help to allow this wound to heal even faster as the patient still continues to have some pitting edema. 12/10/17 on evaluation today patient appears to be doing much better in regard to his lower extremity ulcer. He has been tolerating the dressing changes without complication. Fortunately the compression wrap seems to be doing excellent at this time and overall he is showing signs of good improvement. I'm happy with the overall progress that has been made over the past several weeks but especially in the past week. The patient and his wife both are extremely pleased. 12/18/17; right posterior calf ulcer. Healthy granulation. Dimension slightly smaller. Using silver collagen under compression 12/24/17 on evaluation today patient presents for follow-up concerning his ongoing right lower extremity ulcer. He has been tolerating the dressing changes without complication. Fortunately he seems to be doing excellent at this point. There does not appear to be any evidence of infection. 12/31/17 on evaluation today patient appears to be doing excellent in regard to his right lower extremity ulcer. He has been tolerating the dressing changes without complication. The  wrap appears to have been very beneficial for him up to this point. With that being said he shows no signs of infection at this time and overall I'm very pleased with how things seem to be progressing. 01/07/18 on evaluation today patient actually appears to be doing excellent in regard to his ulcer. In fact there was a lot of dried dressing material noted covering the wound during evaluation today. Once I begin to remove this utilizing a curette I was noted that the patient actually had significant epithelialization underlying this event was just a very small region that appears to still be open otherwise the remainder appears to be closed. Obviously this is excellent news and I think he is progressing very nicely. 01/14/18-he is seen in follow-up evaluation. There is dried dressing which was removed to reveal an epithelialized wound. He will be discharged from services today [facility] 08/05/2019 This is a somewhat frail 86 year old man accompanied by his wife. He was in this clinic in 2009 with a traumatic right lower extremity wound. He comes in this time having suffered a fall on 07/07/2019 primary physician noted a hematoma and x-ray of the arm was negative. It was a bit difficult difficult to figure this out but looking through home health link however his wife says this area actually closed over and the hematoma resolved however he had a second fall on 07/29/2019 with a more substantial hematoma involving the lateral aspect of the right elbow a skin tear over this area and a substantial amount of bleeding in the posterior upper humerus area. Again an x-ray was negative. He has been applying Vaseline gauze twice daily with the help  of his wife. The wound was debrided by primary care on 07/31/2019. Eduardo [name], Eduardo [name] ([id number]) His wife states that she is overwhelmed with the idea of having to care for this wound and request home health. And is much as a can guarantee anything these days with  home health we will try to get somebody out to help her with the dressing. The idea of stopping the Eliquis that he is on for chronic atrial fibrillation in an elderly man who falls has been brought up before and apparently was done once with the assistance of Dr. Tamala Julian his cardiologist in [place] however he apparently had a mini stroke shortly thereafter so he is back on Eliquis. He apparently has gait instability he has a walker Past medical history; atrial fibrillation on Eliquis COPD, history of cerebrovascular disease, hypertension, diastolic heart failure COPD and coronary artery disease 08/12/2019; this is a patient with a substantial hematoma over the lateral epicondyle of the right elbow. I left this intact last week and wrapped with compression however he arrives in clinic this week with 3 gaping holes in the epithelialization over the hematoma. I did not think any of this was going to be viable. I was hoping to keep this intact a little while longer however I do not think this is going to be the case. He required an extensive debridement silver alginate to the resultant wound 08/18/2019 upon evaluation today patient is seen by myself for the first time today regarding the hematoma and injury on his right elbow location where he has a skin tear as well. Unfortunately he still has some hematoma remaining in the proximal portion of the wound. Fortunately there is no signs of active infection at this time. No fevers, chills, nausea, vomiting, or diarrhea. With that being said he has not had any additional falls which is good news and overall it does look like this is getting better compared to what it was when he first came back into the clinic. There has been some concern over whether or not he should continue with the blood thinners secondary to the fact that again he keeps having falls and again this could be detrimental if he were to strike his head. Nonetheless I am not sure there is been a  consensus with his cardiologist yet as to whether or not the blood thinner should be stopped. 08/25/2019 upon evaluation today patient actually appears to be doing excellent in regard to his forearm ulcer. He has been tolerating the dressing changes without complication. Fortunately there is no signs of active infection at this time. No fevers, chills, nausea, vomiting, or diarrhea. 09/01/2019 upon evaluation today patient appears to be doing very well with regard to his wound. This is extremely small even compared to last week and overall he is progressing quite nicely. Electronic Signature(s) Signed: 09/02/2019 5:29:51 PM By: Worthy Keeler PA-C Entered By: Worthy Keeler on 09/02/2019 17:29:51 Eduardo [name], [name] ([id number]) -------------------------------------------------------------------------------- Physical Exam Details Patient Name: Eduardo [name], [name] Date of Service: 09/01/2019 11:00 AM Medical Record Number: [id number] Patient Account Number: [id number] Date of Birth/Sex: 1930/09/13 (86 y.o. M) Treating RN: [name] Primary Care Provider: [name] Other Clinician: Referring Provider: [name] Treating Provider/Extender: Melburn Hake, Ozella Comins Weeks in Treatment: 3 Constitutional Well-nourished and well-hydrated in no acute distress. Respiratory normal breathing without difficulty. Psychiatric this patient is able to make decisions and demonstrates good insight into disease process. Alert and Oriented x 3. pleasant and cooperative. Notes Patient's wound bed currently  showed signs of good granulation at this time. There does not appear to be evidence of active infection which is great news and overall I am extremely happy with where things stand currently. No need for sharp debridement today. Electronic Signature(s) Signed: 09/02/2019 5:30:14 PM By: Worthy Keeler PA-C Entered By: Worthy Keeler on 09/02/2019 17:30:14 Eduardo [name], [name]  ([id number]) -------------------------------------------------------------------------------- Physician Orders Details Patient Name: Eduardo [name]. Date of Service: 09/01/2019 11:00 AM Medical Record Number: [id number] Patient Account Number: [id number] Date of Birth/Sex: 1930-11-01 (86 y.o. M) Treating RN: [name] Primary Care Provider: [name] Other Clinician: Referring Provider: [name] Treating Provider/Extender: Melburn Hake, Mariem Skolnick Weeks in Treatment: 3 Verbal / Phone Orders: No Diagnosis Coding ICD-10 Coding Code Description S40.021D Contusion of right upper arm, subsequent encounter T45.515S Adverse effect of anticoagulants, sequela Wound Cleansing Wound #2 Right Elbow o Clean wound with Normal Saline. Anesthetic (add to Medication List) Wound #2 Right Elbow o Topical Lidocaine 4% cream applied to wound bed prior to debridement (In Clinic Only). Primary Wound Dressing Wound #2 Right Elbow o Silver Collagen - moisten with saline Secondary Dressing Wound #2 Right Elbow o ABD and Kerlix/Conform - secure with netting Dressing Change Frequency Wound #2 Right Elbow o Change Dressing Monday, Wednesday, Friday Follow-up Appointments Wound #2 Right Elbow o Return Appointment in 1 week. Additional Orders / Instructions Wound #2 Right Elbow o Activity as tolerated Electronic Signature(s) Signed: 09/01/2019 4:41:39 PM By: [name] Signed: 09/02/2019 5:56:47 PM By: Worthy Keeler PA-C Entered By: [name] on 09/01/2019 11:21:30 Eduardo [name], [name] ([id number]) -------------------------------------------------------------------------------- Problem List Details Patient Name: JAHZIR, STROHMEIER. Date of Service: 09/01/2019 11:00 AM Medical Record Number: [id number] Patient Account Number: [id number] Date of Birth/Sex: September 27, 1930 (86 y.o. M) Treating RN: [name] Primary Care Provider: [name] Other  Clinician: Referring Provider: [name] Treating Provider/Extender: Melburn Hake, Yohance Hathorne Weeks in Treatment: 3 Active Problems ICD-10 Evaluated Encounter Code Description Active Date Today Diagnosis S40.021D Contusion of right upper arm, subsequent encounter 08/05/2019 No Yes T45.515S Adverse effect of anticoagulants, sequela 08/05/2019 No Yes Inactive Problems Resolved Problems Electronic Signature(s) Signed: 09/01/2019 11:17:05 AM By: Worthy Keeler PA-C Entered By: Worthy Keeler on 09/01/2019 11:17:04 Eduardo [name], [name] ([id number]) -------------------------------------------------------------------------------- Progress Note Details Patient Name: Eduardo [name]. Date of Service: 09/01/2019 11:00 AM Medical Record Number: [id number] Patient Account Number: [id number] Date of Birth/Sex: October 03, 1930 (86 y.o. M) Treating RN: [name] Primary Care Provider: [name] Other Clinician: Referring Provider: [name] Treating Provider/Extender: Melburn Hake, Alianys Chacko Weeks in Treatment: 3 Subjective Chief Complaint Information obtained from Patient Right lower leg ulcer due to trauma 08/05/2019; patient is here for review of a hematoma and skin tear on the lateral part of the right elbow History of Present Illness (HPI) 11/08/17 on evaluation today patient presents initially concerning a right dramatic lower extremity ulcer/skin tear which occurred three weeks ago. He states that he initially was seen in urgent care where the area was sutured closed. The sutures stayed in for two weeks he tells me. Subsequently he was seen at Dr. Alla German office where the sutures were removed and Steri-Strips were placed over the next five days. Subsequently these were also removed and unfortunately at this time it was noted that the patient had a dehisced larger wound that required further attention. The patient and his wife actually requested referral to the wound care center in [place].  [name] made the referral to [place]. Subsequently the patient does have a history  of atrial fibrillation, cardiovascular disease, COPD, stroke, and is on long-term Eliquis. There's no history of diabetes that he states. Subsequently he has no other major medical problems currently. No fevers, chills, nausea, or vomiting noted at this time. 11/14/17-He is seen in follow-up evaluation for right posterior lower extremity ulcer. There is improvement with granulation tissue present; debridement. We will continue with same treatment plan he'll follow-up next week. The vascular office did call to establish an appointment, but he forgot that we had referred him and did not make an appointment. He was encouraged to contact the office to schedule an appointment for evaluation. 11/22/17 on evaluation today patient actually appears to be doing well in regard to his ulcer. This has cleaned up very nicely in my pinion I do believe the Iodoflex has been of benefit for him. He actually does have his arterial study which is scheduled for 11/27/17. In general he's having no significant discomfort mainly just with cleansing of the wound but the dressings themselves don't seem to be causing him problems. 11/29/17 on evaluation today patient appears to be doing very well in regard to his lower extremity ulcer. With that being said he unfortunately prior to coming in for evaluation today did have a trip over a world where he failed hurting his back and striking his head. With that being said he has not had any altered mental status since that time and states that he's not really any more dizzy and there does not appear to be any residual effect from this. He also does not have a significant headache. With that being said this is something I did check out for him today briefly at least since he was in our office. I do think that he needs to look out for any signs or symptoms of anything worsening such as nausea, vomiting, fever,  chills, confusion, or severe headaches. He has no visual disturbance at this point. 12/06/17 on evaluation today patient appears to be doing excellent in regard to his lower extremity ulcer. This is starting to really fill in which is good news as well. We do have the results of the arterial study unfortunately they did not perform the TBI portion of the examination and his ABI's were noncompressible. With that being said they did state that he had triphasic blood flow throughout the bilateral lower extremities and there was apparently no evidence of arterial disease on the arterial ultrasound. Overall this is good news we will be able to initiate stronger compression therapy which should help to allow this wound to heal even faster as the patient still continues to have some pitting edema. 12/10/17 on evaluation today patient appears to be doing much better in regard to his lower extremity ulcer. He has been tolerating the dressing changes without complication. Fortunately the compression wrap seems to be doing excellent at this time and overall he is showing signs of good improvement. I'm happy with the overall progress that has been made over the past several weeks but especially in the past week. The patient and his wife both are extremely pleased. 12/18/17; right posterior calf ulcer. Healthy granulation. Dimension slightly smaller. Using silver collagen under compression 12/24/17 on evaluation today patient presents for follow-up concerning his ongoing right lower extremity ulcer. He has been tolerating the dressing changes without complication. Fortunately he seems to be doing excellent at this point. There does not appear to be any evidence of infection. 12/31/17 on evaluation today patient appears to be doing excellent in regard  to his right lower extremity ulcer. He has been tolerating the dressing changes without complication. The wrap appears to have been very beneficial for him up to this  point. With that being said he shows no signs of infection at this time and overall I'm very pleased with how things seem to be progressing. 01/07/18 on evaluation today patient actually appears to be doing excellent in regard to his ulcer. In fact there was a lot of dried dressing material noted covering the wound during evaluation today. Once I begin to remove this utilizing a curette I was noted that the patient actually had significant epithelialization underlying this event was just a very small region that appears to still be open otherwise the remainder appears to be closed. Obviously this is excellent news and I think he is progressing very nicely. 01/14/18-he is seen in follow-up evaluation. There is dried dressing which was removed to reveal an epithelialized wound. He will be discharged from services today [facility] 08/05/2019 This is a somewhat frail 86 year old man accompanied by his wife. He was in this clinic in 2009 with a traumatic right lower extremity wound. He comes Eduardo [name], Eduardo [name]. ([id number]) in this time having suffered a fall on 07/07/2019 primary physician noted a hematoma and x-ray of the arm was negative. It was a bit difficult difficult to figure this out but looking through home health link however his wife says this area actually closed over and the hematoma resolved however he had a second fall on 07/29/2019 with a more substantial hematoma involving the lateral aspect of the right elbow a skin tear over this area and a substantial amount of bleeding in the posterior upper humerus area. Again an x-ray was negative. He has been applying Vaseline gauze twice daily with the help of his wife. The wound was debrided by primary care on 07/31/2019. His wife states that she is overwhelmed with the idea of having to care for this wound and request home health. And is much as a can guarantee anything these days with home health we will try to get somebody out to help her with  the dressing. The idea of stopping the Eliquis that he is on for chronic atrial fibrillation in an elderly man who falls has been brought up before and apparently was done once with the assistance of Dr. Tamala Julian his cardiologist in [place] however he apparently had a mini stroke shortly thereafter so he is back on Eliquis. He apparently has gait instability he has a walker Past medical history; atrial fibrillation on Eliquis COPD, history of cerebrovascular disease, hypertension, diastolic heart failure COPD and coronary artery disease 08/12/2019; this is a patient with a substantial hematoma over the lateral epicondyle of the right elbow. I left this intact last week and wrapped with compression however he arrives in clinic this week with 3 gaping holes in the epithelialization over the hematoma. I did not think any of this was going to be viable. I was hoping to keep this intact a little while longer however I do not think this is going to be the case. He required an extensive debridement silver alginate to the resultant wound 08/18/2019 upon evaluation today patient is seen by myself for the first time today regarding the hematoma and injury on his right elbow location where he has a skin tear as well. Unfortunately he still has some hematoma remaining in the proximal portion of the wound. Fortunately there is no signs of active infection at this time. No fevers, chills,  nausea, vomiting, or diarrhea. With that being said he has not had any additional falls which is good news and overall it does look like this is getting better compared to what it was when he first came back into the clinic. There has been some concern over whether or not he should continue with the blood thinners secondary to the fact that again he keeps having falls and again this could be detrimental if he were to strike his head. Nonetheless I am not sure there is been a consensus with his cardiologist yet as to whether or not the  blood thinner should be stopped. 08/25/2019 upon evaluation today patient actually appears to be doing excellent in regard to his forearm ulcer. He has been tolerating the dressing changes without complication. Fortunately there is no signs of active infection at this time. No fevers, chills, nausea, vomiting, or diarrhea. 09/01/2019 upon evaluation today patient appears to be doing very well with regard to his wound. This is extremely small even compared to last week and overall he is progressing quite nicely. Objective Constitutional Well-nourished and well-hydrated in no acute distress. Vitals Time Taken: 10:55 AM, Height: 62 in, Weight: 141 lbs, BMI: 25.8, Temperature: 98.6 F, Pulse: 66 bpm, Respiratory Rate: 16 breaths/min, Blood Pressure: 139/59 mmHg. Respiratory normal breathing without difficulty. Psychiatric this patient is able to make decisions and demonstrates good insight into disease process. Alert and Oriented x 3. pleasant and cooperative. General Notes: Patient's wound bed currently showed signs of good granulation at this time. There does not appear to be evidence of active infection which is great news and overall I am extremely happy with where things stand currently. No need for sharp debridement today. Integumentary (Hair, Skin) Wound #2 status is Open. Original cause of wound was Trauma. The wound is located on the Right Elbow. The wound measures 2.7cm length x 0.6cm width x 0.2cm depth; 1.272cm^2 area and 0.254cm^3 volume. There is Fat Layer (Subcutaneous Tissue) Exposed exposed. There is a medium amount of serosanguineous drainage noted. There is medium (34-66%) red granulation within the wound bed. There is a medium (34-66%) amount of necrotic tissue within the wound bed including Eschar and Adherent Slough. Eduardo [name], [name] ([id number]) Assessment Active Problems ICD-10 Contusion of right upper arm, subsequent encounter Adverse effect of anticoagulants,  sequela Plan Wound Cleansing: Wound #2 Right Elbow: Clean wound with Normal Saline. Anesthetic (add to Medication List): Wound #2 Right Elbow: Topical Lidocaine 4% cream applied to wound bed prior to debridement (In Clinic Only). Primary Wound Dressing: Wound #2 Right Elbow: Silver Collagen - moisten with saline Secondary Dressing: Wound #2 Right Elbow: ABD and Kerlix/Conform - secure with netting Dressing Change Frequency: Wound #2 Right Elbow: Change Dressing Monday, Wednesday, Friday Follow-up Appointments: Wound #2 Right Elbow: Return Appointment in 1 week. Additional Orders / Instructions: Wound #2 Right Elbow: Activity as tolerated 1. I would recommend currently that we going continue with the silver collagen dressing I still think that is probably the best way to go and the patient seems to be responding extremely well. 2. I recommend that we secure this with an ABD pad and roll gauze. Stretch netting to hold everything in place. 3. With regard to his blood thinners I think that he is fine to go back on it from a wound care perspective I see no issues here that is going to cause him to have trouble. We will see patient back for reevaluation in 1 week here in the clinic. If anything worsens  or changes patient will contact our office for additional recommendations. Electronic Signature(s) Signed: 09/02/2019 5:30:55 PM By: Worthy Keeler PA-C Entered By: Worthy Keeler on 09/02/2019 17:30:55 Eduardo [name], [name] ([id number]) -------------------------------------------------------------------------------- [facility] Details Patient Name: Eduardo [name]. Date of Service: 09/01/2019 Medical Record Number: [id number] Patient Account Number: [id number] Date of Birth/Sex: 12/22/1930 (86 y.o. M) Treating RN: [name] Primary Care Provider: [name] Other Clinician: Referring Provider: [name] Treating Provider/Extender: Melburn Hake, Loney Peto Weeks in  Treatment: 3 Diagnosis Coding ICD-10 Codes Code Description S40.021D Contusion of right upper arm, subsequent encounter T45.515S Adverse effect of anticoagulants, sequela Facility Procedures CPT4 Code: 51833582 Description: 51898 - WOUND CARE VISIT-LEV 3 EST PT Modifier: Quantity: 1 Physician Procedures CPT4 Code: 4210312 Description: 81188 - WC PHYS LEVEL 3 - EST PT Modifier: Quantity: 1 CPT4 Code: Description: ICD-10 Diagnosis Description S40.021D Contusion of right upper arm, subsequent encounter T45.515S Adverse effect of anticoagulants, sequela Modifier: Quantity: Electronic Signature(s) Signed: 09/02/2019 5:56:47 PM By: Worthy Keeler PA-C Entered By: Worthy Keeler on 09/01/2019 23:51:01

## 2019-09-01 NOTE — Progress Notes (Signed)
**Note Eduardo [name]-Identified via Obfuscation** Eduardo [name] ([id number]) Visit Report for 09/01/2019 Arrival Information Details Patient Name: Eduardo [name], Eduardo [name]. Date of Service: 09/01/2019 11:00 AM Medical Record Number: [id number] Patient Account Number: [id number] Date of Birth/Sex: Oct 29, 1930 (86 y.o. M) Treating RN: [name] Primary Care Karisha Marlin: [name] Other Clinician: Referring Cornie Mccomber: [name] Treating Promyse Ardito/Extender: Melburn Hake, HOYT Weeks in Treatment: 3 Visit Information History Since Last Visit Added or deleted any medications: No Patient Arrived: Walker Any new allergies or adverse reactions: No Arrival Time: 10:58 Had a fall or experienced change in No Accompanied By: wife activities of daily living that may affect Transfer Assistance: None risk of falls: Patient Identification Verified: Yes Signs or symptoms of abuse/neglect since last visito No Secondary Verification Process Completed: Yes Hospitalized since last visit: No Implantable device outside of the clinic excluding No cellular tissue based products placed in the center since last visit: Has Dressing in Place as Prescribed: Yes Pain Present Now: No Electronic Signature(s) Signed: 09/01/2019 3:58:05 PM By: Lorine Bears RCP, RRT, CHT Entered By: Lorine Bears on 09/01/2019 11:00:03 Hellberg, Krystle M. ([id number]) -------------------------------------------------------------------------------- Clinic Level of Care Assessment Details Patient Name: Eduardo [name], Eduardo [name]. Date of Service: 09/01/2019 11:00 AM Medical Record Number: [id number] Patient Account Number: [id number] Date of Birth/Sex: 1931-04-08 (86 y.o. M) Treating RN: [name] Primary Care Nyiah Pianka: [name] Other Clinician: Referring Yolinda Duerr: [name] Treating Kelley Polinsky/Extender: Melburn Hake, HOYT Weeks in Treatment: 3 Clinic Level of Care Assessment Items TOOL 4 Quantity Score []  - Use when only an EandM is  performed on FOLLOW-UP visit 0 ASSESSMENTS - Nursing Assessment / Reassessment X - Reassessment of Co-morbidities (includes updates in patient status) 1 10 X- 1 5 Reassessment of Adherence to Treatment Plan ASSESSMENTS - Wound and Skin Assessment / Reassessment X - Simple Wound Assessment / Reassessment - one wound 1 5 []  - 0 Complex Wound Assessment / Reassessment - multiple wounds []  - 0 Dermatologic / Skin Assessment (not related to wound area) ASSESSMENTS - Focused Assessment []  - Circumferential Edema Measurements - multi extremities 0 []  - 0 Nutritional Assessment / Counseling / Intervention []  - 0 Lower Extremity Assessment (monofilament, tuning fork, pulses) []  - 0 Peripheral Arterial Disease Assessment (using hand held doppler) ASSESSMENTS - Ostomy and/or Continence Assessment and Care []  - Incontinence Assessment and Management 0 []  - 0 Ostomy Care Assessment and Management (repouching, etc.) PROCESS - Coordination of Care X - Simple Patient / Family Education for ongoing care 1 15 []  - 0 Complex (extensive) Patient / Family Education for ongoing care X- 1 10 Staff obtains Programmer, systems, Records, Test Results / Process Orders []  - 0 Staff telephones HHA, Nursing Homes / Clarify orders / etc []  - 0 Routine Transfer to another Facility (non-emergent condition) []  - 0 Routine Hospital Admission (non-emergent condition) []  - 0 New Admissions / Biomedical engineer / Ordering NPWT, Apligraf, etc. []  - 0 Emergency Hospital Admission (emergent condition) X- 1 10 Simple Discharge Coordination []  - 0 Complex (extensive) Discharge Coordination PROCESS - Special Needs []  - Pediatric / Minor Patient Management 0 []  - 0 Isolation Patient Management []  - 0 Hearing / Language / Visual special needs []  - 0 Assessment of Community assistance (transportation, D/C planning, etc.) [name], [name] ([id number]) []  - 0 Additional assistance / Altered mentation []  -  0 Support Surface(s) Assessment (bed, cushion, seat, etc.) INTERVENTIONS - Wound Cleansing / Measurement X - Simple Wound Cleansing - one wound 1 5 []  - 0 Complex Wound Cleansing - multiple wounds  X- 1 5 Wound Imaging (photographs - any number of wounds) []  - 0 Wound Tracing (instead of photographs) X- 1 5 Simple Wound Measurement - one wound []  - 0 Complex Wound Measurement - multiple wounds INTERVENTIONS - Wound Dressings X - Small Wound Dressing one or multiple wounds 1 10 []  - 0 Medium Wound Dressing one or multiple wounds []  - 0 Large Wound Dressing one or multiple wounds []  - 0 Application of Medications - topical []  - 0 Application of Medications - injection INTERVENTIONS - Miscellaneous []  - External ear exam 0 []  - 0 Specimen Collection (cultures, biopsies, blood, body fluids, etc.) []  - 0 Specimen(s) / Culture(s) sent or taken to Lab for analysis []  - 0 Patient Transfer (multiple staff / Civil Service fast streamer / Similar devices) []  - 0 Simple Staple / Suture removal (25 or less) []  - 0 Complex Staple / Suture removal (26 or more) []  - 0 Hypo / Hyperglycemic Management (close monitor of Blood Glucose) []  - 0 Ankle / Brachial Index (ABI) - do not check if billed separately X- 1 5 Vital Signs Has the patient been seen at the hospital within the last three years: Yes Total Score: 85 Level Of Care: New/Established - Level 3 Electronic Signature(s) Signed: 09/01/2019 4:41:39 PM By: [name] Entered By: [name] on 09/01/2019 11:26:28 [name], [name] ([id number]) -------------------------------------------------------------------------------- Encounter Discharge Information Details Patient Name: Eduardo [name]. Date of Service: 09/01/2019 11:00 AM Medical Record Number: [id number] Patient Account Number: [id number] Date of Birth/Sex: 01-Mar-1931 (86 y.o. M) Treating RN: [name] Primary Care Krzysztof Reichelt: [name] Other Clinician: Referring  Anira Senegal: [name] Treating Georgean Spainhower/Extender: Melburn Hake, HOYT Weeks in Treatment: 3 Encounter Discharge Information Items Discharge Condition: Stable Ambulatory Status: Wheelchair Discharge Destination: Home Transportation: Private Auto Accompanied By: wife Schedule Follow-up Appointment: Yes Clinical Summary of Care: Electronic Signature(s) Signed: 09/01/2019 4:41:39 PM By: [name] Entered By: [name] on 09/01/2019 11:27:39 [name], [name] ([id number]) -------------------------------------------------------------------------------- Lower Extremity Assessment Details Patient Name: Eduardo [name]. Date of Service: 09/01/2019 11:00 AM Medical Record Number: [id number] Patient Account Number: [id number] Date of Birth/Sex: 1931-04-23 (86 y.o. M) Treating RN: Army Melia Primary Care Hellon Vaccarella: [name] Other Clinician: Referring Tangee Marszalek: [name] Treating Koron Godeaux/Extender: Melburn Hake, HOYT Weeks in Treatment: 3 Electronic Signature(s) Signed: 09/01/2019 4:13:28 PM By: Army Melia Entered By: Army Melia on 09/01/2019 11:07:50 [name], [name] ([id number]) -------------------------------------------------------------------------------- Multi Wound Chart Details Patient Name: Eduardo [name]. Date of Service: 09/01/2019 11:00 AM Medical Record Number: [id number] Patient Account Number: [id number] Date of Birth/Sex: [birth year] (86 y.o. M) Treating RN: [name] Primary Care Dwayne Begay: [name] Other Clinician: Referring Yasamin Karel: [name] Treating Emilie Carp/Extender: Melburn Hake, HOYT Weeks in Treatment: 3 Vital Signs Height(in): 62 Pulse(bpm): 58 Weight(lbs): 141 Blood Pressure(mmHg): 139/59 Body Mass Index(BMI): 26 Temperature(F): 98.6 Respiratory Rate(breaths/min): 16 Photos: [N/A:N/A] Wound Location: Right Elbow N/A N/A Wounding Event: Trauma N/A N/A Primary Etiology: Skin Tear N/A N/A Comorbid  History: Glaucoma, Chronic Obstructive N/A N/A Pulmonary Disease (COPD), Arrhythmia, Congestive Heart Failure, Coronary Artery Disease, Myocardial Infarction, Neuropathy Date Acquired: 07/29/2019 N/A N/A Weeks of Treatment: 3 N/A N/A Wound Status: Open N/A N/A Measurements L x W x D (cm) 2.7x0.6x0.2 N/A N/A Area (cm) : 1.272 N/A N/A Volume (cm) : 0.254 N/A N/A % Reduction in Area: 93.00% N/A N/A % Reduction in Volume: 86.00% N/A N/A Classification: Full Thickness Without Exposed N/A N/A Support Structures Exudate Amount: Medium N/A N/A Exudate Type: Serosanguineous N/A N/A Exudate Color: red, brown  N/A N/A Granulation Amount: Medium (34-66%) N/A N/A Granulation Quality: Red N/A N/A Necrotic Amount: Medium (34-66%) N/A N/A Necrotic Tissue: Eschar, Adherent Slough N/A N/A Exposed Structures: Fat Layer (Subcutaneous Tissue) N/A N/A Exposed: Yes Fascia: No Tendon: No Muscle: No Joint: No Bone: No Epithelialization: None N/A N/A Treatment Notes Electronic Signature(s) Eduardo [name], Eduardo [name] ([id number]) Signed: 09/01/2019 4:41:39 PM By: [name] Entered By: [name] on 09/01/2019 11:19:36 [name], Eduardo [place] M. ([id number]) -------------------------------------------------------------------------------- [place] Details Patient Name: Eduardo [name]. Date of Service: 09/01/2019 11:00 AM Medical Record Number: [id number] Patient Account Number: [id number] Date of Birth/Sex: 05-22-1931 (86 y.o. M) Treating RN: [name] Primary Care Bitha Fauteux: [name] Other Clinician: Referring Lisbet Busker: [name] Treating Lindy Pennisi/Extender: Melburn Hake, HOYT Weeks in Treatment: 3 Active Inactive Abuse / Safety / Falls / Self Care Management Nursing Diagnoses: History of Falls Goals: Patient will not experience any injury related to falls Date Initiated: 08/05/2019 Target Resolution Date: 08/19/2019 Goal Status: Active Interventions: Assess  fall risk on admission and as needed Notes: Necrotic Tissue Nursing Diagnoses: Impaired tissue integrity related to necrotic/devitalized tissue Goals: Necrotic/devitalized tissue will be minimized in the wound bed Date Initiated: 08/05/2019 Target Resolution Date: 08/19/2019 Goal Status: Active Interventions: Assess patient pain level pre-, during and post procedure and prior to discharge Treatment Activities: Apply topical anesthetic as ordered : 08/05/2019 Notes: Orientation to the Wound Care Program Nursing Diagnoses: Knowledge deficit related to the wound healing center program Goals: Patient/caregiver will verbalize understanding of the [facility] Program Date Initiated: 08/05/2019 Target Resolution Date: 08/19/2019 Goal Status: Active Interventions: Provide education on orientation to the wound center Notes: Pain, Acute or Chronic Eduardo [name], [name] ([id number]) Nursing Diagnoses: Potential alteration in comfort, pain Goals: Patient will verbalize adequate pain control and receive pain control interventions during procedures as needed Date Initiated: 08/05/2019 Target Resolution Date: 08/19/2019 Goal Status: Active Interventions: Assess comfort goal upon admission Complete pain assessment as per visit requirements Notes: Wound/Skin Impairment Nursing Diagnoses: Impaired tissue integrity Goals: Patient/caregiver will verbalize understanding of skin care regimen Date Initiated: 08/05/2019 Target Resolution Date: 08/19/2019 Goal Status: Active Ulcer/skin breakdown will have a volume reduction of 30% by week 4 Date Initiated: 08/05/2019 Target Resolution Date: 09/02/2019 Goal Status: Active Interventions: Assess patient/caregiver ability to obtain necessary supplies Assess ulceration(s) every visit Treatment Activities: Patient referred to home care : 08/05/2019 Notes: Electronic Signature(s) Signed: 09/01/2019 4:41:39 PM By: [name] Entered By:  [name] on 09/01/2019 11:19:27 Eduardo [name], [name] ([id number]) -------------------------------------------------------------------------------- Pain Assessment Details Patient Name: Eduardo [name]. Date of Service: 09/01/2019 11:00 AM Medical Record Number: [id number] Patient Account Number: [id number] Date of Birth/Sex: 19-Jun-1930 (86 y.o. M) Treating RN: Army Melia Primary Care Alanmichael Barmore: [name] Other Clinician: Referring Jayliani Wanner: [name] Treating Yetzali Weld/Extender: Melburn Hake, HOYT Weeks in Treatment: 3 Active Problems Location of Pain Severity and Description of Pain Patient Has Paino No Site Locations Pain Management and Medication Current Pain Management: Electronic Signature(s) Signed: 09/01/2019 4:13:28 PM By: Army Melia Entered By: Army Melia on 09/01/2019 11:05:50 [name], [name] ([id number]) -------------------------------------------------------------------------------- Patient/Caregiver Education Details Patient Name: Eduardo [name]. Date of Service: 09/01/2019 11:00 AM Medical Record Number: [id number] Patient Account Number: [id number] Date of Birth/Gender: 1931/06/05 (86 y.o. M) Treating RN: [name] Primary Care Physician: [name] Other Clinician: Referring Physician: [name] Treating Physician/Extender: Sharalyn Ink in Treatment: 3 Education Assessment Education Provided To: Patient and Caregiver Education Topics Provided Wound/Skin Impairment: Handouts: Other: wound care as ordered Methods: Demonstration, Explain/Verbal  Responses: State content correctly Electronic Signature(s) Signed: 09/01/2019 4:41:39 PM By: [name] Entered By: [name] on 09/01/2019 11:26:59 Emanuele, Deonta M. ([id number]) -------------------------------------------------------------------------------- Wound Assessment Details Patient Name: Eduardo [name]. Date of Service: 09/01/2019 11:00  AM Medical Record Number: [id number] Patient Account Number: [id number] Date of Birth/Sex: 09-30-1930 (86 y.o. M) Treating RN: Army Melia Primary Care Jasman Murri: [name] Other Clinician: Referring Lavanda Nevels: [name] Treating Skyy Mcknight/Extender: Melburn Hake, HOYT Weeks in Treatment: 3 Wound Status Wound Number: 2 Primary Skin Tear Etiology: Wound Location: Right Elbow Wound Open Wounding Event: Trauma Status: Date Acquired: 07/29/2019 Comorbid Glaucoma, Chronic Obstructive Pulmonary Disease (COPD), Weeks Of Treatment: 3 History: Arrhythmia, Congestive Heart Failure, Coronary Artery Clustered Wound: No Disease, Myocardial Infarction, Neuropathy Photos Wound Measurements Length: (cm) 2.7 Width: (cm) 0.6 Depth: (cm) 0.2 Area: (cm) 1.272 Volume: (cm) 0.254 % Reduction in Area: 93% % Reduction in Volume: 86% Epithelialization: None Wound Description Classification: Full Thickness Without Exposed Support Structu Exudate Amount: Medium Exudate Type: Serosanguineous Exudate Color: red, brown res Foul Odor After Cleansing: No Slough/Fibrino Yes Wound Bed Granulation Amount: Medium (34-66%) Exposed Structure Granulation Quality: Red Fascia Exposed: No Necrotic Amount: Medium (34-66%) Fat Layer (Subcutaneous Tissue) Exposed: Yes Necrotic Quality: Eschar, Adherent Slough Tendon Exposed: No Muscle Exposed: No Joint Exposed: No Bone Exposed: No Treatment Notes Wound #2 (Right Elbow) Notes prisma, Abd, conform, netting Electronic Signature(s) Signed: 09/01/2019 4:13:28 PM By: Reva Bores, Daymond M. ([id number]) Entered By: Army Melia on 09/01/2019 11:06:29 Wint, Fedrick M. ([id number]) -------------------------------------------------------------------------------- Vitals Details Patient Name: Eduardo [name]. Date of Service: 09/01/2019 11:00 AM Medical Record Number: [id number] Patient Account Number: [id number] Date of Birth/Sex:  1931/04/03 (86 y.o. M) Treating RN: [name] Primary Care Aleta Manternach: [name] Other Clinician: Referring Marcelle Hepner: [name] Treating Uzoma Vivona/Extender: Melburn Hake, HOYT Weeks in Treatment: 3 Vital Signs Time Taken: 10:55 Temperature (F): 98.6 Height (in): 62 Pulse (bpm): 66 Weight (lbs): 141 Respiratory Rate (breaths/min): 16 Body Mass Index (BMI): 25.8 Blood Pressure (mmHg): 139/59 Reference Range: 80 - 120 mg / dl Electronic Signature(s) Signed: 09/01/2019 3:58:05 PM By: Lorine Bears RCP, RRT, CHT Entered By: Lorine Bears on 09/01/2019 11:00:31

## 2019-09-03 ENCOUNTER — Other Ambulatory Visit: Payer: Self-pay | Admitting: *Deleted

## 2019-09-03 DIAGNOSIS — I5032 Chronic diastolic (congestive) heart failure: Secondary | ICD-10-CM

## 2019-09-03 DIAGNOSIS — I10 Essential (primary) hypertension: Secondary | ICD-10-CM

## 2019-09-06 ENCOUNTER — Other Ambulatory Visit: Payer: Self-pay

## 2019-09-06 ENCOUNTER — Emergency Department
Admission: EM | Admit: 2019-09-06 | Discharge: 2019-09-06 | Disposition: A | Discharge status: Home / Self Care | Payer: PPO | Attending: Emergency Medicine | Admitting: Emergency Medicine

## 2019-09-06 ENCOUNTER — Emergency Department: Payer: PPO

## 2019-09-06 DIAGNOSIS — Z79899 Other long term (current) drug therapy: Secondary | ICD-10-CM | POA: Insufficient documentation

## 2019-09-06 DIAGNOSIS — R519 Headache, unspecified: Secondary | ICD-10-CM | POA: Diagnosis not present

## 2019-09-06 DIAGNOSIS — Y929 Unspecified place or not applicable: Secondary | ICD-10-CM | POA: Diagnosis not present

## 2019-09-06 DIAGNOSIS — I4891 Unspecified atrial fibrillation: Secondary | ICD-10-CM | POA: Diagnosis not present

## 2019-09-06 DIAGNOSIS — Y999 Unspecified external cause status: Secondary | ICD-10-CM | POA: Diagnosis not present

## 2019-09-06 DIAGNOSIS — W19XXXA Unspecified fall, initial encounter: Secondary | ICD-10-CM

## 2019-09-06 DIAGNOSIS — Y939 Activity, unspecified: Secondary | ICD-10-CM | POA: Diagnosis not present

## 2019-09-06 DIAGNOSIS — S0990XA Unspecified injury of head, initial encounter: Secondary | ICD-10-CM | POA: Insufficient documentation

## 2019-09-06 DIAGNOSIS — Z7901 Long term (current) use of anticoagulants: Secondary | ICD-10-CM | POA: Insufficient documentation

## 2019-09-06 DIAGNOSIS — I5032 Chronic diastolic (congestive) heart failure: Secondary | ICD-10-CM | POA: Insufficient documentation

## 2019-09-06 DIAGNOSIS — I110 Hypertensive heart disease with heart failure: Secondary | ICD-10-CM | POA: Diagnosis not present

## 2019-09-06 DIAGNOSIS — W010XXA Fall on same level from slipping, tripping and stumbling without subsequent striking against object, initial encounter: Secondary | ICD-10-CM | POA: Insufficient documentation

## 2019-09-06 DIAGNOSIS — I2510 Atherosclerotic heart disease of native coronary artery without angina pectoris: Secondary | ICD-10-CM | POA: Insufficient documentation

## 2019-09-06 DIAGNOSIS — Z87891 Personal history of nicotine dependence: Secondary | ICD-10-CM | POA: Insufficient documentation

## 2019-09-06 NOTE — ED Provider Notes (Signed)
[facility] Emergency Department Provider Note  ____________________________________________  Time seen: Approximately 11:08 PM  I have reviewed the triage vital signs and the nursing notes.   HISTORY  Chief Complaint Fall   HPI Emarion Toral Morini is a 86 y.o. male with history of CAD on Plavix who presents for evaluation after mechanical fall.  Patient experienced a fall at home when he attempted to sit on his walker.  The walker slipped from underneath him and he fell on his back.  He hit his head on the floor.  He denies LOC.  He is on Plavix.  Is complaining of mild occipital pain.  No neck pain, no back pain, no extremity pain, no chest pain, no abdominal pain.  Patient reports that the fall was mechanical in nature.  He is otherwise doing well and has no other medical complaints.   Past Medical History:  Diagnosis Date  . Allergy   . Anemia 2012, 08/2015   chronic. Acute due to large hematoma 2013. 2017: due to gastric AVM bleeding.   . Anxiety   . CAD (coronary artery disease)    a. 06/2010 Cath: LM nl, LAD 50p/m, LCX 88m, RCA 50p/m -->catheter dissection but good flow, 70d, RPDA 50-70;  b. 06/2010 Relook cath due to bradycardia and inf ST elev: RCA dissection @ ostium extending into coronary cusp and prox RCA, Ao dissection-->less staining than previously-->Med Rx; c. 08/2018 MV: No ischemia.  . Chronic combined systolic (congestive) and diastolic (congestive) heart failure ([place])    a. 12/2014 Echo: EF 60-65%; b. 07/2016 Echo: EF 45-50%, mild LVH. Mild AS/MS. Mod-sev MR. Mild to mod TR. PASP 47mmHg; c. 08/2018 Echo: EF 55-60%, RVSP 68.68mmHg. Sev dil LA, mod dil RA. at least mod MR, mild to mod TR. Mild AS.  Marland Kitchen COPD (chronic obstructive pulmonary disease) ([place]) 2013   exertional dyspnea  . Gastric angiodysplasia with hemorrhage 08/2015  . Hx of colonic polyps 2001, 2011   adenomatous 2001. HP 2001, 2011.  Marland Kitchen Hyperlipidemia   . Mitral regurgitation    a.  07/2016 Echo: Mod to sev MR; b. 08/2018 Echo: at least moderate MR.  . Myocardial infarction ([place]) 06/2010.  12/2011.   "twice; 1 day apart; during/after cath". NQMI in setting severe anemia 2013.   Marland Kitchen Neuropathy 2014   in feet, likely due to spinal stenosis, DDD, HNP  . Osteoarthritis 2002   spinal stenosis, spondylolisthesis, disc protrusion multilevel in lumbar spine  . Permanent atrial fibrillation ([place]) 2012   a.  CHA2DS2VASc = 6-->eliquis;  b. 12/2015 Echo: EF 60-65%, no rwma, LVH, mild AS/MS/MR, sev dil LA, mild TR, PASP 70mmHg.  . Stroke ([place]) 2016  . Upper GI bleed 08/2015   EGD: 2 small gastric AVMs, 1 actively bleeding.  Both treated with APC ablation, clipping of the bleeder    Patient Active Problem List   Diagnosis Date Noted  . Rib pain on right side 08/03/2019  . Open arm wound, right, subsequent encounter 07/07/2019  . GERD (gastroesophageal reflux disease) 07/02/2019  . CAD (coronary artery disease), native coronary artery 12/28/2017  . Bilateral carotid artery stenosis 12/04/2017  . Alcohol dependence in remission ([place]) 06/24/2017  . Imbalance 02/05/2017  . Mitral regurgitation 07/17/2016  . Dyslipidemia 07/14/2016  . Encounter for anticoagulation discussion and counseling 05/03/2015  . Chronic anticoagulation 03/01/2015  . 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 TIA (transient ischemic attack) 12/31/2014  . Advanced directives, counseling/discussion 04/19/2014  . Spinal stenosis, lumbar  region, with neurogenic claudication 12/23/2012  . Osteoarthritis, multiple sites 10/08/2012  . Preventative health care 02/25/2012  . MI, old with prior NSTEMI x 2 12/28/2011  . Chronic diastolic HF (heart failure) ([place]) 12/28/2011    Class: Acute  . Edema 09/20/2010  . Allergic rhinitis 11/11/2008  . Mood disorder ([place]) 04/14/2008  . Essential hypertension 04/14/2008  . Chronic atrial fibrillation ([place]) 04/14/2008  . COPD GOLD II B 04/14/2008    Past  Surgical History:  Procedure Laterality Date  . APPENDECTOMY    . CARDIAC CATHETERIZATION  2012   50% LAD and luminal irreg in RCA, 1/12  Post cath MI from damage  . CARPAL TUNNEL RELEASE     left  . CARPAL TUNNEL RELEASE  10/11/11   Dr Rush Barer  . CATARACT EXTRACTION W/ INTRAOCULAR LENS IMPLANT  2/13   Dr [name]  . COLONOSCOPY  2001, 02/2010   Dr Deatra Ina.   . ESOPHAGOGASTRODUODENOSCOPY N/A 08/15/2015   [name] MD; 2 small gastric AVMs, 1 actively bleeding.  Both treated with APC ablation, clipping of the bleeder  . FOOT FRACTURE SURGERY     right heel repair  . FRACTURE SURGERY    . KNEE ARTHROSCOPY     left, right knee 10/2009  . MASTOIDECTOMY     x3 on right  . NASAL SEPTUM SURGERY     nasal septal repair  . PENILE PROSTHESIS  REMOVAL    . PENILE PROSTHESIS IMPLANT     x 2--got infection after 2nd and had to remove  . PILONIDAL CYST / SINUS EXCISION    . SHOULDER ARTHROSCOPY     right  . TONSILLECTOMY AND ADENOIDECTOMY      Prior to Admission medications   Medication Sig Start Date End Date Taking? Authorizing Provider  albuterol (PROVENTIL) (2.5 MG/3ML) 0.083% nebulizer solution Use 1 vial in nebulizer every 4 hours as needed for wheezing or shortness of breath 02/09/19   Collene Gobble, MD  ALPRAZolam Duanne Moron) 0.25 MG tablet Take 1 tablet (0.25 mg total) by mouth 2 (two) times daily as needed for anxiety. 12/11/17   Venia Carbon, MD  atorvastatin (LIPITOR) 80 MG tablet Take 1 tablet (80 mg total) by mouth daily. 07/03/19   Minna Merritts, MD  B Complex-C (SUPER B COMPLEX PO) Take 1 tablet by mouth at bedtime.     [provider]  cetirizine (ZYRTEC) 10 MG tablet Take 10 mg by mouth at bedtime.     [provider]  cholecalciferol (VITAMIN D) 1000 units tablet Take 1,000 Units by mouth daily.    [provider]  Cholecalciferol (VITAMIN D3) 50 MCG (2000 UT) TABS Take 50 mcg by mouth daily.    [provider]    clindamycin (CLEOCIN) 300 MG capsule Take 300 mg by mouth every 6 (six) hours. 07/20/19   [provider]  ELIQUIS 5 MG TABS tablet TAKE ONE TABLET BY MOUTH TWICE DAILY  05/18/19   Minna Merritts, MD  escitalopram (LEXAPRO) 20 MG tablet Take 20 mg by mouth at bedtime.     [provider]  fenofibrate 160 MG tablet TAKE ONE TABLET BY MOUTH DAILY AT BEDTIME  03/27/19   [name] I, MD  furosemide (LASIX) 40 MG tablet Take 1 tablet (40 mg total) by mouth daily. 11/28/18   Venia Carbon, MD  hydrALAZINE (APRESOLINE) 10 MG tablet TAKE ONE TABLET BY MOUTH THREE TIMES DAILY  05/18/19   Minna Merritts, MD  HYDROcodone-acetaminophen (NORCO/VICODIN) 5-325 MG tablet Take 1 tablet by mouth every 4 (four) hours as needed for moderate pain.    [provider]  ipratropium (ATROVENT) 0.03 % nasal spray INSTILL TWO SPRAYS INTO BOTH NOSTRILS AT BEDTIME 07/20/19   Collene Gobble, MD  lamoTRIgine (LAMICTAL) 150 MG tablet Take 150 mg by mouth at bedtime. Reported on 08/14/2015 07/07/15   [provider]  montelukast (SINGULAIR) 10 MG tablet TAKE ONE TABLET BY MOUTH DAILY AT BEDTIME  06/01/19   Collene Gobble, MD  Multiple Vitamin (MULTIVITAMIN) tablet Take 1 tablet by mouth at bedtime.     [provider]  nitroGLYCERIN (NITROSTAT) 0.4 MG SL tablet Place 1 tablet (0.4 mg total) under the tongue every 5 (five) minutes as needed for chest pain. 08/19/18 06/22/19  Theora Gianotti, NP  pantoprazole (PROTONIX) 40 MG tablet TAKE 1 TABLET BY MOUTH DAILY BEFORE BREAKFAST. Patient taking differently: TAKE 1 TABLET BY MOUTH EVERY OTHER DAY BEFORE BREAKFAST 10/14/18   Venia Carbon, MD  STIOLTO RESPIMAT 2.5-2.5 MCG/ACT AERS INHALE TWO PUFFS BY MOUTH INTO THE LUNGS DAILY 06/15/19   Collene Gobble, MD  triamcinolone cream (KENALOG) 0.1 % Apply 1 application topically 2 (two) times daily as needed (for skin).  08/10/15   [provider]  verapamil (CALAN-SR) 240  MG CR tablet TAKE ONE TABLET BY MOUTH DAILY AT BEDTIME  07/20/19   Minna Merritts, MD    Allergies Penicillins, Sulfonamide derivatives, and Tape  Family History  Problem Relation Age of Onset  . Cancer Mother        ? leukemia  . Heart attack Father   . Stroke Maternal Grandmother   . Diabetes Neg Hx   . Hypertension Neg Hx     Social History Social History   Tobacco Use  . Smoking status: Former Smoker    Packs/day: 2.50    Years: 30.00    Pack years: 75.00    Types: Cigarettes    Quit date: 06/11/1978    Years since quitting: 41.2  . Smokeless tobacco: Never Used  Substance Use Topics  . Alcohol use: No    Comment: QUIT [id number] alcoholic  . Drug use: No    Review of Systems  Constitutional: Negative for fever. Eyes: Negative for visual changes. ENT: Negative for facial injury or neck injury Cardiovascular: Negative for chest injury. Respiratory: Negative for shortness of breath. Negative for chest wall injury. Gastrointestinal: Negative for abdominal pain or injury. Genitourinary: Negative for dysuria. Musculoskeletal: Negative for back injury, negative for arm or leg pain. Skin: Negative for laceration/abrasions. Neurological: + head injury.   ____________________________________________   PHYSICAL EXAM:  VITAL SIGNS: ED Triage Vitals  Enc Vitals Group     BP 09/06/19 2141 (!) 161/67     Pulse Rate 09/06/19 2141 73     Resp 09/06/19 2141 18     Temp 09/06/19 2141 98.9 F (37.2 C)     Temp Source 09/06/19 2141 Oral     SpO2 09/06/19 2141 99 %     Weight 09/06/19 2142 135 lb (61.2 kg)     Height 09/06/19 2142 5\' 1"  (1.549 m)     Head Circumference --      Peak Flow --      Pain Score 09/06/19 2142 0     Pain Loc --      Pain Edu? --      Excl. in [place]? --     Full spinal precautions  maintained throughout the trauma exam. Constitutional: Alert and oriented. No acute distress. Does not appear intoxicated. HEENT Head: Normocephalic and  atraumatic. Face: No facial bony tenderness. Stable midface Ears: No hemotympanum bilaterally. No Battle sign Eyes: No eye injury. PERRL. No raccoon eyes Nose: Nontender. No epistaxis. No rhinorrhea Mouth/Throat: Mucous membranes are moist. No oropharyngeal blood. No dental injury. Airway patent without stridor. Normal voice. Neck: no C-collar. No midline c-spine tenderness.  Cardiovascular: Normal rate, regular rhythm. Normal and symmetric distal pulses are present in all extremities. Pulmonary/Chest: Chest wall is stable and nontender to palpation/compression. Normal respiratory effort. Breath sounds are normal. No crepitus.  Abdominal: Soft, nontender, non distended. Musculoskeletal: Nontender with normal full range of motion in all extremities. No deformities. No thoracic or lumbar midline spinal tenderness. Pelvis is stable. Skin: Skin is warm, dry and intact. No abrasions or contutions. Psychiatric: Speech and behavior are appropriate. Neurological: Normal speech and language. Moves all extremities to command. No gross focal neurologic deficits are appreciated.  Glascow Coma Score: 4 - Opens eyes on own 6 - Follows simple motor commands 5 - Alert and oriented GCS: 15   ____________________________________________   LABS (all labs ordered are listed, but only abnormal results are displayed)  Labs Reviewed - No data to display ____________________________________________  EKG  none  ____________________________________________  RADIOLOGY  I have personally reviewed the images performed during this visit and I agree with the Radiologist's read.   Interpretation by Radiologist:  CT Head Wo Contrast  Result Date: 09/06/2019 CLINICAL DATA:  Recent fall with headaches, initial encounter EXAM: CT HEAD WITHOUT CONTRAST TECHNIQUE: Contiguous axial images were obtained from the base of the skull through the vertex without intravenous contrast. COMPARISON:  12/31/2014 FINDINGS:  Brain: Chronic atrophic and ischemic changes are again identified and stable. Cavum septum pellucidum is again seen. No findings to suggest acute hemorrhage, acute infarction or space-occupying mass lesion are seen. Vascular: No hyperdense vessel or unexpected calcification. Skull: Normal. Negative for fracture or focal lesion. Sinuses/Orbits: No acute finding. Other: None. IMPRESSION: Chronic atrophic and ischemic changes stable from the prior study. No acute intracranial abnormality noted. Electronically Signed   By: Inez Catalina M.D.   On: 09/06/2019 22:48     ____________________________________________   PROCEDURES  Procedure(s) performed: None Procedures Critical Care performed:  None ____________________________________________   INITIAL IMPRESSION / ASSESSMENT AND PLAN / ED COURSE   86 y.o. male with history of CAD on Plavix who presents for evaluation after mechanical fall.  Patient hit his head on the floor after slipping off his walker.  Exam is within normal limits with no real trauma to the head, CT no spine with no tenderness.  Patient is neurologically intact with no signs or symptoms of basilar skull fracture.  Head CT negative for intracranial injury.  Patient was offered Tylenol but he declined as he has no pain.  Discussed my standard return precautions and follow-up with PCP.  Patient be discharged home to the care of his wife.  I have reviewed patient's previous medical records and PMH.    ____________________________________________  Please note:  Patient was evaluated in Emergency Department today for the symptoms described in the history of present illness. Patient was evaluated in the context of the global COVID-19 pandemic, which necessitated consideration that the patient might be at risk for infection with the SARS-CoV-2 virus that causes COVID-19. Institutional protocols and algorithms that pertain to the evaluation of patients at risk for COVID-19 are in a state  of  rapid change based on information released by regulatory bodies including the CDC and federal and state organizations. These policies and algorithms were followed during the patient's care in the ED.  Some ED evaluations and interventions may be delayed as a result of limited staffing during the pandemic.   As part of my medical decision making, I reviewed the following data within the [facility] notes reviewed and incorporated, Old chart reviewed, Radiograph reviewed , Notes from prior ED visits and [place] Controlled Substance Database   ____________________________________________   FINAL CLINICAL IMPRESSION(S) / ED DIAGNOSES   Final diagnoses:  Fall, initial encounter  Injury of head, initial encounter      NEW MEDICATIONS STARTED DURING THIS VISIT:  ED Discharge Orders    None       Note:  This document was prepared using Dragon voice recognition software and may include unintentional dictation errors.    Alfred Levins, [place], MD 09/06/19 [phone]

## 2019-09-06 NOTE — ED Triage Notes (Signed)
Pt states he experienced a fall at home when he attempted to sit on his walker and it slid from under him, pt fell on his back side and hit his head on the floor.   Denis LOC, no bleeding noted, no dizziness, or blurred vision. Pt is on eliquist at this time.

## 2019-09-06 NOTE — Discharge Instructions (Addendum)

## 2019-09-07 ENCOUNTER — Other Ambulatory Visit: Payer: Self-pay | Admitting: Emergency Medicine

## 2019-09-07 DIAGNOSIS — I5032 Chronic diastolic (congestive) heart failure: Secondary | ICD-10-CM | POA: Diagnosis not present

## 2019-09-07 DIAGNOSIS — S41101D Unspecified open wound of right upper arm, subsequent encounter: Secondary | ICD-10-CM | POA: Diagnosis not present

## 2019-09-07 DIAGNOSIS — I2510 Atherosclerotic heart disease of native coronary artery without angina pectoris: Secondary | ICD-10-CM | POA: Diagnosis not present

## 2019-09-07 DIAGNOSIS — I110 Hypertensive heart disease with heart failure: Secondary | ICD-10-CM | POA: Diagnosis not present

## 2019-09-07 DIAGNOSIS — Z87891 Personal history of nicotine dependence: Secondary | ICD-10-CM | POA: Diagnosis not present

## 2019-09-07 DIAGNOSIS — G8911 Acute pain due to trauma: Secondary | ICD-10-CM | POA: Diagnosis not present

## 2019-09-07 DIAGNOSIS — M159 Polyosteoarthritis, unspecified: Secondary | ICD-10-CM | POA: Diagnosis not present

## 2019-09-07 DIAGNOSIS — Z9181 History of falling: Secondary | ICD-10-CM | POA: Diagnosis not present

## 2019-09-07 DIAGNOSIS — I051 Rheumatic mitral insufficiency: Secondary | ICD-10-CM | POA: Diagnosis not present

## 2019-09-07 DIAGNOSIS — Z7901 Long term (current) use of anticoagulants: Secondary | ICD-10-CM | POA: Diagnosis not present

## 2019-09-07 DIAGNOSIS — I252 Old myocardial infarction: Secondary | ICD-10-CM | POA: Diagnosis not present

## 2019-09-07 DIAGNOSIS — M48061 Spinal stenosis, lumbar region without neurogenic claudication: Secondary | ICD-10-CM | POA: Diagnosis not present

## 2019-09-07 DIAGNOSIS — R0781 Pleurodynia: Secondary | ICD-10-CM | POA: Diagnosis not present

## 2019-09-07 DIAGNOSIS — K219 Gastro-esophageal reflux disease without esophagitis: Secondary | ICD-10-CM | POA: Diagnosis not present

## 2019-09-07 DIAGNOSIS — I6523 Occlusion and stenosis of bilateral carotid arteries: Secondary | ICD-10-CM | POA: Diagnosis not present

## 2019-09-07 DIAGNOSIS — J449 Chronic obstructive pulmonary disease, unspecified: Secondary | ICD-10-CM | POA: Diagnosis not present

## 2019-09-07 DIAGNOSIS — I482 Chronic atrial fibrillation, unspecified: Secondary | ICD-10-CM | POA: Diagnosis not present

## 2019-09-07 DIAGNOSIS — F39 Unspecified mood [affective] disorder: Secondary | ICD-10-CM | POA: Diagnosis not present

## 2019-09-08 ENCOUNTER — Encounter: Payer: PPO | Admitting: Physician Assistant

## 2019-09-08 ENCOUNTER — Other Ambulatory Visit: Payer: Self-pay

## 2019-09-08 DIAGNOSIS — S40021D Contusion of right upper arm, subsequent encounter: Secondary | ICD-10-CM | POA: Diagnosis not present

## 2019-09-08 DIAGNOSIS — S51001A Unspecified open wound of right elbow, initial encounter: Secondary | ICD-10-CM | POA: Diagnosis not present

## 2019-09-09 ENCOUNTER — Ambulatory Visit: Payer: Self-pay

## 2019-09-09 ENCOUNTER — Telehealth: Payer: Self-pay

## 2019-09-09 DIAGNOSIS — I10 Essential (primary) hypertension: Secondary | ICD-10-CM

## 2019-09-09 DIAGNOSIS — Z7901 Long term (current) use of anticoagulants: Secondary | ICD-10-CM

## 2019-09-09 NOTE — Telephone Encounter (Signed)
CCM PCP consult regarding iron supplementation and hydralazine:  Venia Carbon, MD  Debbora Dus, [facility]  Actually, I meant 1-2 months for the iron       Previous Messages   ----- Message -----  From: Debbora Dus, [facility]  Sent: 08/23/2019  6:18 PM EDT  To: Venia Carbon, MD   Doristine Devoid, I will let Mr. Pelly know and discuss hydralazine with Dr Rockey Situ, appreciate it!   ----- Message -----  From: Venia Carbon, MD   Sent: 08/22/2019  5:38 PM EDT  To: Debbora Dus, [place]   I am fine with trying the every other day iron---but should not continue if no response in blood count after 1-2 weeks.   Dr Rockey Situ has pretty much been dictating the cardiac meds---you may want to check with him about the hydralazine (it sounds good to me---I try to avoid any tid meds)   ----- Message -----  From: Debbora Dus, [facility]  Sent: 08/21/2019 11:55 AM EST  To: Venia Carbon, MD   Dr. Orpah Clinton and his wife asked about his iron and whether it needs to be resumed. Previous iron labs were checked in 2017. Pt reports he discontinued due to constipation. Ferritin < 30 and TSAT < 19 in 2017, iron WNL. CBC stable but low with previous Hgb 10.5. He may better tolerate every other day dosing if desired. I am also curious if we could transition him off of hydralazine due to concerns with CAD and try an ACE or ARB? As well it would reduce pill burden. His renal function looks great and potassium is 3.9. Thank you!   Sharyn Lull

## 2019-09-09 NOTE — Chronic Care Management (AMB) (Signed)
CCM Follow Up:  Telephone call completed with patient regarding starting iron per PCP consult. Consulted cardiology regarding hydralazine.  1. Instructed patient to begin taking iron supplement every other day (three days per week). Take on empty stomach on opposite days as pantoprazole. Will repeat CBC in 1-2 months. Take colace daily to prevent constipation. Call with any adverse effects.  2. Reviewed medications with patient that may cause imbalance. Patient denies any OTC medications for sleep. Plans to discuss any further concerns with psychiatry.  3. Reviewed low fat, low carbohydrate diet for triglyceride reduction. Pt avoids alcohol.   4. Pt picked up new home blood pressure monitor and will call with readings.  CCM follow up: September 24, 2019   Cardiology consulted for HTN below: ----- Message -----  From: Minna Merritts, MD  Sent: 08/29/2019  1:15 PM EDT  To: Debbora Dus, [place], Valora Corporal, RN  Subject: RE: Hydralazine                  Pam,  Can we have him call with some blood pressures from home  Also needs updated BMP on any visit he has coming up. If no visits, perhaps a [facility] visit  Thx  TG  ----- Message -----  From: Debbora Dus, [facility]  Sent: 08/23/2019  6:05 PM EDT  To: Minna Merritts, MD  Subject: Hydralazine                    Dr. Rockey Situ,   I am working with Mr. Overfelt over at [facility] through [facility]. I appreciate your care for your patients. I always hear great things. Upon reviewing his chart, it seems there may be an opportunity to optimize his HTN regimen. Dr. [name] suggested running this by you as you have managed his HTN meds previously.   Per chart review, hydralazine was started inpatient while he had AKI and hyperkalemia, so ACE/ARB were avoided. They were also concerned about his sulfa intolerance, so avoided thiazides. He is currently on verapamil 240 mg at bedtime, lasix 40 mg daily, and hydralazine  10 mg TID.   As he is not on a nitrate, I have some concern about continuing hydralazine with CAD. As well we could reduce his pill burden from TID to daily. His renal function looks great and potassium is 3.9. Would you approve of tapering of hydralazine and trying an ACE or ARB?   Thank you!   Debbora Dus, PharmD  Clinical Pharmacist  [facility] at [facility]  [phone]

## 2019-09-10 ENCOUNTER — Other Ambulatory Visit: Payer: Self-pay | Admitting: Cardiovascular Disease

## 2019-09-10 NOTE — Progress Notes (Signed)
FOUNT, BAHE ([id number]) Visit Report for 09/08/2019 Arrival Information Details Patient Name: Eduardo [name], Eduardo [name]. Date of Service: 09/08/2019 11:00 AM Medical Record Number: [id number] Patient Account Number: [id number] Date of Birth/Sex: 03-14-1931 (86 y.o. M) Treating RN: Army Melia Primary Care Deniya Craigo: [name] Other Clinician: Referring Kosei Rhodes: [name] Treating Suann Klier/Extender: Melburn Hake, HOYT Weeks in Treatment: 4 Visit Information History Since Last Visit Added or deleted any medications: No Patient Arrived: Ambulatory Any new allergies or adverse reactions: No Arrival Time: 11:15 Had a fall or experienced change in No Accompanied By: wife activities of daily living that may affect Transfer Assistance: None risk of falls: Patient Identification Verified: Yes Signs or symptoms of abuse/neglect since last visito No Hospitalized since last visit: No Has Dressing in Place as Prescribed: Yes Pain Present Now: No Electronic Signature(s) Signed: 09/08/2019 11:39:10 AM By: Army Melia Entered By: Army Melia on 09/08/2019 11:16:05 Llamas, Cohl M. ([id number]) -------------------------------------------------------------------------------- Encounter Discharge Information Details Patient Name: Eduardo [name]. Date of Service: 09/08/2019 11:00 AM Medical Record Number: [id number] Patient Account Number: [id number] Date of Birth/Sex: 1931/02/27 (86 y.o. M) Treating RN: [name] Primary Care Patty Lopezgarcia: [name] Other Clinician: Referring Winner Valeriano: [name] Treating Jerine Surles/Extender: Melburn Hake, HOYT Weeks in Treatment: 4 Encounter Discharge Information Items Discharge Condition: Stable Ambulatory Status: Walker Discharge Destination: Home Transportation: Private Auto Accompanied By: spouse Schedule Follow-up Appointment: Yes Clinical Summary of Care: Electronic Signature(s) Signed: 09/10/2019 4:50:17 PM By: [name] Entered By: [name] on 09/08/2019 11:33:29 Ruacho, Vance M. ([id number]) -------------------------------------------------------------------------------- Lower Extremity Assessment Details Patient Name: Eduardo [name]. Date of Service: 09/08/2019 11:00 AM Medical Record Number: [id number] Patient Account Number: [id number] Date of Birth/Sex: 05-05-1931 (86 y.o. M) Treating RN: Army Melia Primary Care Alexxa Sabet: [name] Other Clinician: Referring Nyjah [name]: [name] Treating Courteny Egler/Extender: Melburn Hake, HOYT Weeks in Treatment: 4 Electronic Signature(s) Signed: 09/08/2019 11:39:10 AM By: Army Melia Entered By: Army Melia on 09/08/2019 11:20:19 Warga, Padraic M. ([id number]) -------------------------------------------------------------------------------- Multi Wound Chart Details Patient Name: Eduardo [name]. Date of Service: 09/08/2019 11:00 AM Medical Record Number: [id number] Patient Account Number: [id number] Date of Birth/Sex: Apr 08, 1931 (86 y.o. M) Treating RN: [name] Primary Care Moses Odoherty: [name] Other Clinician: Referring Viney Acocella: [name] Treating Devonne Kitchen/Extender: Melburn Hake, HOYT Weeks in Treatment: 4 Vital Signs Height(in): 62 Pulse(bpm): 54 Weight(lbs): 141 Blood Pressure(mmHg): 127/49 Body Mass Index(BMI): 26 Temperature(F): 97.8 Respiratory Rate(breaths/min): 16 Photos: [N/A:N/A] Wound Location: Right Elbow N/A N/A Wounding Event: Trauma N/A N/A Primary Etiology: Skin Tear N/A N/A Comorbid History: Glaucoma, Chronic Obstructive N/A N/A Pulmonary Disease (COPD), Arrhythmia, Congestive Heart Failure, Coronary Artery Disease, Myocardial Infarction, Neuropathy Date Acquired: 07/29/2019 N/A N/A Weeks of Treatment: 4 N/A N/A Wound Status: Open N/A N/A Measurements L x W x D (cm) 1.3x0.5x0.1 N/A N/A Area (cm) : 0.511 N/A N/A Volume (cm) : 0.051 N/A N/A % Reduction in Area: 97.20% N/A  N/A % Reduction in Volume: 97.20% N/A N/A Classification: Full Thickness Without Exposed N/A N/A Support Structures Exudate Amount: Medium N/A N/A Exudate Type: Serosanguineous N/A N/A Exudate Color: red, brown N/A N/A Granulation Amount: Large (67-100%) N/A N/A Granulation Quality: Red N/A N/A Necrotic Amount: Small (1-33%) N/A N/A Necrotic Tissue: Eschar, Adherent Slough N/A N/A Exposed Structures: Fat Layer (Subcutaneous Tissue) N/A N/A Exposed: Yes Fascia: No Tendon: No Muscle: No Joint: No Bone: No Epithelialization: Medium (34-66%) N/A N/A Treatment Notes Electronic Signature(s) DREXEL, IVEY ([id number]) Signed: 09/10/2019 4:50:17 PM By: [name] Entered By: [name] on 09/08/2019 11:28:44  JADARIOUS, DOBBINS [place] ([id number]) -------------------------------------------------------------------------------- [place] Details Patient Name: [name], Eduardo. Date of Service: 09/08/2019 11:00 AM Medical Record Number: [id number] Patient Account Number: [id number] Date of Birth/Sex: 1931-05-20 (86 y.o. M) Treating RN: [name] Primary Care Natania Finigan: [name] Other Clinician: Referring Jhoan Schmieder: [name] Treating Mikai Meints/Extender: Melburn Hake, HOYT Weeks in Treatment: 4 Active Inactive Abuse / Safety / Falls / Self Care Management Nursing Diagnoses: History of Falls Goals: Patient will not experience any injury related to falls Date Initiated: 08/05/2019 Target Resolution Date: 08/19/2019 Goal Status: Active Interventions: Assess fall risk on admission and as needed Notes: Necrotic Tissue Nursing Diagnoses: Impaired tissue integrity related to necrotic/devitalized tissue Goals: Necrotic/devitalized tissue will be minimized in the wound bed Date Initiated: 08/05/2019 Target Resolution Date: 08/19/2019 Goal Status: Active Interventions: Assess patient pain level pre-, during and post procedure and prior to  discharge Treatment Activities: Apply topical anesthetic as ordered : 08/05/2019 Notes: Orientation to the Wound Care Program Nursing Diagnoses: Knowledge deficit related to the wound healing center program Goals: Patient/caregiver will verbalize understanding of the [facility] Date Initiated: 08/05/2019 Target Resolution Date: 08/19/2019 Goal Status: Active Interventions: Provide education on orientation to the wound center Notes: Pain, Acute or Chronic Figuereo, Casimiro M. ([id number]) Nursing Diagnoses: Potential alteration in comfort, pain Goals: Patient will verbalize adequate pain control and receive pain control interventions during procedures as needed Date Initiated: 08/05/2019 Target Resolution Date: 08/19/2019 Goal Status: Active Interventions: Assess comfort goal upon admission Complete pain assessment as per visit requirements Notes: Wound/Skin Impairment Nursing Diagnoses: Impaired tissue integrity Goals: Patient/caregiver will verbalize understanding of skin care regimen Date Initiated: 08/05/2019 Target Resolution Date: 08/19/2019 Goal Status: Active Ulcer/skin breakdown will have a volume reduction of 30% by week 4 Date Initiated: 08/05/2019 Target Resolution Date: 09/02/2019 Goal Status: Active Interventions: Assess patient/caregiver ability to obtain necessary supplies Assess ulceration(s) every visit Treatment Activities: Patient referred to home care : 08/05/2019 Notes: Electronic Signature(s) Signed: 09/10/2019 4:50:17 PM By: [name] Entered By: [name] on 09/08/2019 11:28:24 Winegardner, Derian M. ([id number]) -------------------------------------------------------------------------------- Pain Assessment Details Patient Name: Eduardo [name]. Date of Service: 09/08/2019 11:00 AM Medical Record Number: [id number] Patient Account Number: [id number] Date of Birth/Sex: 1931-05-03 (86 y.o. M) Treating RN: Army Melia Primary Care Rishav Rockefeller: [name] Other Clinician: Referring Hasan Douse: [name] Treating Marvella Jenning/Extender: Melburn Hake, HOYT Weeks in Treatment: 4 Active Problems Location of Pain Severity and Description of Pain Patient Has Paino No Site Locations Pain Management and Medication Current Pain Management: Electronic Signature(s) Signed: 09/08/2019 11:39:10 AM By: Army Melia Entered By: Army Melia on 09/08/2019 11:17:41 Reagle, Dawon M. ([id number]) -------------------------------------------------------------------------------- Patient/Caregiver Education Details Patient Name: Eduardo [name]. Date of Service: 09/08/2019 11:00 AM Medical Record Number: [id number] Patient Account Number: [id number] Date of Birth/Gender: 01/29/1931 (86 y.o. M) Treating RN: [name] Primary Care Physician: [name] Other Clinician: Referring Physician: [name] Treating Physician/Extender: Sharalyn Ink in Treatment: 4 Education Assessment Education Provided To: Patient and Caregiver Education Topics Provided Wound/Skin Impairment: Handouts: Other: wound care as ordered Methods: Demonstration, Explain/Verbal Responses: State content correctly Electronic Signature(s) Signed: 09/10/2019 4:50:17 PM By: [name] Entered By: [name] on 09/08/2019 11:32:52 Eickhoff, Corvin M. ([id number]) -------------------------------------------------------------------------------- Wound Assessment Details Patient Name: Eduardo [name]. Date of Service: 09/08/2019 11:00 AM Medical Record Number: [id number] Patient Account Number: [id number] Date of Birth/Sex: 12-23-1930 (86 y.o. M) Treating RN: Army Melia Primary Care Oseias Horsey: [name] Other Clinician: Referring Shahd Occhipinti: [name] Treating Keeana Pieratt/Extender: Joaquim Lai  III, HOYT Weeks in Treatment: 4 Wound Status Wound Number: 2 Primary Skin Tear Etiology: Wound Location: Right  Elbow Wound Open Wounding Event: Trauma Status: Date Acquired: 07/29/2019 Comorbid Glaucoma, Chronic Obstructive Pulmonary Disease (COPD), Weeks Of Treatment: 4 History: Arrhythmia, Congestive Heart Failure, Coronary Artery Clustered Wound: No Disease, Myocardial Infarction, Neuropathy Photos Wound Measurements Length: (cm) 1.3 Width: (cm) 0.5 Depth: (cm) 0.1 Area: (cm) 0.511 Volume: (cm) 0.051 % Reduction in Area: 97.2% % Reduction in Volume: 97.2% Epithelialization: Medium (34-66%) Tunneling: No Undermining: No Wound Description Classification: Full Thickness Without Exposed Support Structu Exudate Amount: Medium Exudate Type: Serosanguineous Exudate Color: red, brown res Foul Odor After Cleansing: No Slough/Fibrino Yes Wound Bed Granulation Amount: Large (67-100%) Exposed Structure Granulation Quality: Red Fascia Exposed: No Necrotic Amount: Small (1-33%) Fat Layer (Subcutaneous Tissue) Exposed: Yes Necrotic Quality: Eschar, Adherent Slough Tendon Exposed: No Muscle Exposed: No Joint Exposed: No Bone Exposed: No Treatment Notes Wound #2 (Right Elbow) Notes hydrofera blue, Abd, conform, netting Electronic Signature(s) Signed: 09/08/2019 11:39:10 AM By: Reva Bores, Devontaye M. ([id number]) Entered By: Army Melia on 09/08/2019 11:20:02 Dockery, Mccormick M. ([id number]) -------------------------------------------------------------------------------- Vitals Details Patient Name: Eduardo [name]. Date of Service: 09/08/2019 11:00 AM Medical Record Number: [id number] Patient Account Number: [id number] Date of Birth/Sex: [birth year] (86 y.o. M) Treating RN: Army Melia Primary Care Leslea Vowles: [name] Other Clinician: Referring Shalayne Leach: [name] Treating Annora Guderian/Extender: Melburn Hake, HOYT Weeks in Treatment: 4 Vital Signs Time Taken: 11:15 Temperature (F): 97.8 Height (in): 62 Pulse (bpm): 54 Weight (lbs): 141 Respiratory Rate  (breaths/min): 16 Body Mass Index (BMI): 25.8 Blood Pressure (mmHg): 127/49 Reference Range: 80 - 120 mg / dl Electronic Signature(s) Signed: 09/08/2019 11:39:10 AM By: Army Melia Entered By: Army Melia on 09/08/2019 11:17:30

## 2019-09-11 NOTE — Progress Notes (Signed)
CONNER, MUEGGE ([id number]) Visit Report for 09/08/2019 Chief Complaint Document Details Patient Name: Eduardo [name], Eduardo [name]. Date of Service: 09/08/2019 11:00 AM Medical Record Number: [id number] Patient Account Number: [id number] Date of Birth/Sex: [birth year] (86 y.o. M) Treating RN: [name] Primary Care Provider: [name] Other Clinician: Referring Provider: [name] Treating Provider/Extender: Melburn Hake, Elchanan Bob Weeks in Treatment: 4 Information Obtained from: Patient Chief Complaint Right lower leg ulcer due to trauma 08/05/2019; patient is here for review of a hematoma and skin tear on the lateral part of the right elbow Electronic Signature(s) Signed: 09/08/2019 11:40:16 AM By: Worthy Keeler PA-C Entered By: Worthy Keeler on 09/08/2019 11:40:16 [name], [name] ([id number]) -------------------------------------------------------------------------------- HPI Details Patient Name: Eduardo [name]. Date of Service: 09/08/2019 11:00 AM Medical Record Number: [id number] Patient Account Number: [id number] Date of Birth/Sex: 26-May-1931 (86 y.o. M) Treating RN: [name] Primary Care Provider: [name] Other Clinician: Referring Provider: [name] Treating Provider/Extender: Melburn Hake, Karlei Waldo Weeks in Treatment: 4 History of Present Illness HPI Description: 11/08/17 on evaluation today patient presents initially concerning a right dramatic lower extremity ulcer/skin tear which occurred three weeks ago. He states that he initially was seen in urgent care where the area was sutured closed. The sutures stayed in for two weeks he tells me. Subsequently he was seen at Dr. Alla German office where the sutures were removed and Steri-Strips were placed over the next five days. Subsequently these were also removed and unfortunately at this time it was noted that the patient had a dehisced larger wound that required further attention. The patient and his  wife actually requested referral to the wound care center in [place]. [name] made the referral to [place]. Subsequently the patient does have a history of atrial fibrillation, cardiovascular disease, COPD, stroke, and is on long-term Eliquis. There's no history of diabetes that he states. Subsequently he has no other major medical problems currently. No fevers, chills, nausea, or vomiting noted at this time. 11/14/17-He is seen in follow-up evaluation for right posterior lower extremity ulcer. There is improvement with granulation tissue present; debridement. We will continue with same treatment plan he'll follow-up next week. The vascular office did call to establish an appointment, but he forgot that we had referred him and did not make an appointment. He was encouraged to contact the office to schedule an appointment for evaluation. 11/22/17 on evaluation today patient actually appears to be doing well in regard to his ulcer. This has cleaned up very nicely in my pinion I do believe the Iodoflex has been of benefit for him. He actually does have his arterial study which is scheduled for 11/27/17. In general he's having no significant discomfort mainly just with cleansing of the wound but the dressings themselves don't seem to be causing him problems. 11/29/17 on evaluation today patient appears to be doing very well in regard to his lower extremity ulcer. With that being said he unfortunately prior to coming in for evaluation today did have a trip over a world where he failed hurting his back and striking his head. With that being said he has not had any altered mental status since that time and states that he's not really any more dizzy and there does not appear to be any residual effect from this. He also does not have a significant headache. With that being said this is something I did check out for him today briefly at least since he was in our office. I do think that he needs to  look out for any signs or  symptoms of anything worsening such as nausea, vomiting, fever, chills, confusion, or severe headaches. He has no visual disturbance at this point. 12/06/17 on evaluation today patient appears to be doing excellent in regard to his lower extremity ulcer. This is starting to really fill in which is good news as well. We do have the results of the arterial study unfortunately they did not perform the TBI portion of the examination and his ABI's were noncompressible. With that being said they did state that he had triphasic blood flow throughout the bilateral lower extremities and there was apparently no evidence of arterial disease on the arterial ultrasound. Overall this is good news we will be able to initiate stronger compression therapy which should help to allow this wound to heal even faster as the patient still continues to have some pitting edema. 12/10/17 on evaluation today patient appears to be doing much better in regard to his lower extremity ulcer. He has been tolerating the dressing changes without complication. Fortunately the compression wrap seems to be doing excellent at this time and overall he is showing signs of good improvement. I'm happy with the overall progress that has been made over the past several weeks but especially in the past week. The patient and his wife both are extremely pleased. 12/18/17; right posterior calf ulcer. Healthy granulation. Dimension slightly smaller. Using silver collagen under compression 12/24/17 on evaluation today patient presents for follow-up concerning his ongoing right lower extremity ulcer. He has been tolerating the dressing changes without complication. Fortunately he seems to be doing excellent at this point. There does not appear to be any evidence of infection. 12/31/17 on evaluation today patient appears to be doing excellent in regard to his right lower extremity ulcer. He has been tolerating the dressing changes without complication. The  wrap appears to have been very beneficial for him up to this point. With that being said he shows no signs of infection at this time and overall I'm very pleased with how things seem to be progressing. 01/07/18 on evaluation today patient actually appears to be doing excellent in regard to his ulcer. In fact there was a lot of dried dressing material noted covering the wound during evaluation today. Once I begin to remove this utilizing a curette I was noted that the patient actually had significant epithelialization underlying this event was just a very small region that appears to still be open otherwise the remainder appears to be closed. Obviously this is excellent news and I think he is progressing very nicely. 01/14/18-he is seen in follow-up evaluation. There is dried dressing which was removed to reveal an epithelialized wound. He will be discharged from services today [place] 08/05/2019 This is a somewhat frail 86 year old man accompanied by his wife. He was in this clinic in 2009 with a traumatic right lower extremity wound. He comes in this time having suffered a fall on 07/07/2019 primary physician noted a hematoma and x-ray of the arm was negative. It was a bit difficult difficult to figure this out but looking through home health link however his wife says this area actually closed over and the hematoma resolved however he had a second fall on 07/29/2019 with a more substantial hematoma involving the lateral aspect of the right elbow a skin tear over this area and a substantial amount of bleeding in the posterior upper humerus area. Again an x-ray was negative. He has been applying Vaseline gauze twice daily with the help  of his wife. The wound was debrided by primary care on 07/31/2019. TANK, DIFIORE ([id number]) His wife states that she is overwhelmed with the idea of having to care for this wound and request home health. And is much as a can guarantee anything these days with  home health we will try to get somebody out to help her with the dressing. The idea of stopping the Eliquis that he is on for chronic atrial fibrillation in an elderly man who falls has been brought up before and apparently was done once with the assistance of Dr. Tamala Julian his cardiologist in [place] however he apparently had a mini stroke shortly thereafter so he is back on Eliquis. He apparently has gait instability he has a walker Past medical history; atrial fibrillation on Eliquis COPD, history of cerebrovascular disease, hypertension, diastolic heart failure COPD and coronary artery disease 08/12/2019; this is a patient with a substantial hematoma over the lateral epicondyle of the right elbow. I left this intact last week and wrapped with compression however he arrives in clinic this week with 3 gaping holes in the epithelialization over the hematoma. I did not think any of this was going to be viable. I was hoping to keep this intact a little while longer however I do not think this is going to be the case. He required an extensive debridement silver alginate to the resultant wound 08/18/2019 upon evaluation today patient is seen by myself for the first time today regarding the hematoma and injury on his right elbow location where he has a skin tear as well. Unfortunately he still has some hematoma remaining in the proximal portion of the wound. Fortunately there is no signs of active infection at this time. No fevers, chills, nausea, vomiting, or diarrhea. With that being said he has not had any additional falls which is good news and overall it does look like this is getting better compared to what it was when he first came back into the clinic. There has been some concern over whether or not he should continue with the blood thinners secondary to the fact that again he keeps having falls and again this could be detrimental if he were to strike his head. Nonetheless I am not sure there is been a  consensus with his cardiologist yet as to whether or not the blood thinner should be stopped. 08/25/2019 upon evaluation today patient actually appears to be doing excellent in regard to his forearm ulcer. He has been tolerating the dressing changes without complication. Fortunately there is no signs of active infection at this time. No fevers, chills, nausea, vomiting, or diarrhea. 09/01/2019 upon evaluation today patient appears to be doing very well with regard to his wound. This is extremely small even compared to last week and overall he is progressing quite nicely. 09/08/2019 upon evaluation today patient appears to be doing excellent in regard to his Elbow ulcer. Overall this is showing signs of excellent improvement and seems to be doing extremely well. I see no signs of infection and to be honest I think he is all but healed at this point. There was some hyper granulation tissue Electronic Signature(s) Signed: 09/09/2019 9:23:53 PM By: Worthy Keeler PA-C Previous Signature: 09/09/2019 9:13:57 PM Version By: Worthy Keeler PA-C Entered By: Worthy Keeler on 09/09/2019 21:23:53 [name], [name] ([id number]) -------------------------------------------------------------------------------- Otelia Sergeant TISS Details Patient Name: Eduardo [name]. Date of Service: 09/08/2019 11:00 AM Medical Record Number: [id number] Patient Account Number: [id number] Date of  Birth/Sex: [birth year] (86 y.o. M) Treating RN: [name] Primary Care Provider: [name] Other Clinician: Referring Provider: [name] Treating Provider/Extender: Melburn Hake, Malikhi Ogan Weeks in Treatment: 4 Procedure Performed for: Wound #2 Right Elbow Performed By: Physician STONE III, Kota Ciancio E., PA-C Post Procedure Diagnosis Same as Pre-procedure Notes 1 stick of silver nitrate used Electronic Signature(s) Signed: 09/10/2019 4:50:17 PM By: [name] Entered By: [name] on 09/08/2019  11:29:17 Peak, [name] ([id number]) -------------------------------------------------------------------------------- Physical Exam Details Patient Name: Eduardo [name]. Date of Service: 09/08/2019 11:00 AM Medical Record Number: [id number] Patient Account Number: [id number] Date of Birth/Sex: [birth year] (86 y.o. M) Treating RN: [name] Primary Care Provider: [name] Other Clinician: Referring Provider: [name] Treating Provider/Extender: Melburn Hake, Emric Kowalewski Weeks in Treatment: 4 Constitutional Well-nourished and well-hydrated in no acute distress. Respiratory normal breathing without difficulty. Psychiatric this patient is able to make decisions and demonstrates good insight into disease process. Alert and Oriented x 3. pleasant and cooperative. Notes Patient's wound again showed signs of hyper granulation there does not appear to be any evidence of infection which is great news. No fevers, chills, nausea, vomiting, or diarrhea. Electronic Signature(s) Signed: 09/09/2019 9:14:13 PM By: Worthy Keeler PA-C Entered By: Worthy Keeler on 09/09/2019 21:14:13 [name], [name] ([id number]) -------------------------------------------------------------------------------- Physician Orders Details Patient Name: Eduardo [name]. Date of Service: 09/08/2019 11:00 AM Medical Record Number: [id number] Patient Account Number: [id number] Date of Birth/Sex: July 29, 1930 (86 y.o. M) Treating RN: [name] Primary Care Provider: [name] Other Clinician: Referring Provider: [name] Treating Provider/Extender: Melburn Hake, Brentyn Seehafer Weeks in Treatment: 4 Verbal / Phone Orders: No Diagnosis Coding Wound Cleansing Wound #2 Right Elbow o Clean wound with Normal Saline. Anesthetic (add to Medication List) Wound #2 Right Elbow o Topical Lidocaine 4% cream applied to wound bed prior to debridement (In Clinic Only). Primary Wound Dressing Wound #2  Right Elbow o Hydrafera Blue Ready Transfer Secondary Dressing Wound #2 Right Elbow o ABD and Kerlix/Conform - secure with netting Dressing Change Frequency Wound #2 Right Elbow o Change Dressing Monday, Wednesday, Friday Follow-up Appointments Wound #2 Right Elbow o Return Appointment in 1 week. Additional Orders / Instructions Wound #2 Right Elbow o Activity as tolerated Electronic Signature(s) Signed: 09/10/2019 4:50:17 PM By: [name] Signed: 09/10/2019 5:29:46 PM By: Worthy Keeler PA-C Entered By: [name] on 09/08/2019 11:31:44 [name], [name] ([id number]) -------------------------------------------------------------------------------- Problem List Details Patient Name: Eduardo [name]. Date of Service: 09/08/2019 11:00 AM Medical Record Number: [id number] Patient Account Number: [id number] Date of Birth/Sex: Mar 25, 1931 (86 y.o. M) Treating RN: [name] Primary Care Provider: [name] Other Clinician: Referring Provider: [name] Treating Provider/Extender: Melburn Hake, Oriyah Lamphear Weeks in Treatment: 4 Active Problems ICD-10 Evaluated Encounter Code Description Active Date Today Diagnosis S40.021D Contusion of right upper arm, subsequent encounter 08/05/2019 No Yes T45.515S Adverse effect of anticoagulants, sequela 08/05/2019 No Yes Inactive Problems Resolved Problems Electronic Signature(s) Signed: 09/08/2019 11:40:11 AM By: Worthy Keeler PA-C Entered By: Worthy Keeler on 09/08/2019 11:40:11 [name], [name] ([id number]) -------------------------------------------------------------------------------- Progress Note Details Patient Name: Eduardo [name]. Date of Service: 09/08/2019 11:00 AM Medical Record Number: [id number] Patient Account Number: [id number] Date of Birth/Sex: June 17, 1930 (86 y.o. M) Treating RN: [name] Primary Care Provider: [name] Other Clinician: Referring Provider: [name] Treating Provider/Extender: Melburn Hake, Liannah Yarbough Weeks in Treatment: 4 Subjective Chief Complaint Information obtained from Patient Right lower leg ulcer due to trauma 08/05/2019; patient is here for review of a  hematoma and skin tear on the lateral part of the right elbow History of Present Illness (HPI) 11/08/17 on evaluation today patient presents initially concerning a right dramatic lower extremity ulcer/skin tear which occurred three weeks ago. He states that he initially was seen in urgent care where the area was sutured closed. The sutures stayed in for two weeks he tells me. Subsequently he was seen at Dr. Alla German office where the sutures were removed and Steri-Strips were placed over the next five days. Subsequently these were also removed and unfortunately at this time it was noted that the patient had a dehisced larger wound that required further attention. The patient and his wife actually requested referral to the wound care center in [place]. [name] made the referral to [place]. Subsequently the patient does have a history of atrial fibrillation, cardiovascular disease, COPD, stroke, and is on long-term Eliquis. There's no history of diabetes that he states. Subsequently he has no other major medical problems currently. No fevers, chills, nausea, or vomiting noted at this time. 11/14/17-He is seen in follow-up evaluation for right posterior lower extremity ulcer. There is improvement with granulation tissue present; debridement. We will continue with same treatment plan he'll follow-up next week. The vascular office did call to establish an appointment, but he forgot that we had referred him and did not make an appointment. He was encouraged to contact the office to schedule an appointment for evaluation. 11/22/17 on evaluation today patient actually appears to be doing well in regard to his ulcer. This has cleaned up very nicely in my pinion I do believe the Iodoflex has been of benefit for  him. He actually does have his arterial study which is scheduled for 11/27/17. In general he's having no significant discomfort mainly just with cleansing of the wound but the dressings themselves don't seem to be causing him problems. 11/29/17 on evaluation today patient appears to be doing very well in regard to his lower extremity ulcer. With that being said he unfortunately prior to coming in for evaluation today did have a trip over a world where he failed hurting his back and striking his head. With that being said he has not had any altered mental status since that time and states that he's not really any more dizzy and there does not appear to be any residual effect from this. He also does not have a significant headache. With that being said this is something I did check out for him today briefly at least since he was in our office. I do think that he needs to look out for any signs or symptoms of anything worsening such as nausea, vomiting, fever, chills, confusion, or severe headaches. He has no visual disturbance at this point. 12/06/17 on evaluation today patient appears to be doing excellent in regard to his lower extremity ulcer. This is starting to really fill in which is good news as well. We do have the results of the arterial study unfortunately they did not perform the TBI portion of the examination and his ABI's were noncompressible. With that being said they did state that he had triphasic blood flow throughout the bilateral lower extremities and there was apparently no evidence of arterial disease on the arterial ultrasound. Overall this is good news we will be able to initiate stronger compression therapy which should help to allow this wound to heal even faster as the patient still continues to have some pitting edema. 12/10/17 on evaluation today patient appears to be doing much better  in regard to his lower extremity ulcer. He has been tolerating the dressing changes without  complication. Fortunately the compression wrap seems to be doing excellent at this time and overall he is showing signs of good improvement. I'm happy with the overall progress that has been made over the past several weeks but especially in the past week. The patient and his wife both are extremely pleased. 12/18/17; right posterior calf ulcer. Healthy granulation. Dimension slightly smaller. Using silver collagen under compression 12/24/17 on evaluation today patient presents for follow-up concerning his ongoing right lower extremity ulcer. He has been tolerating the dressing changes without complication. Fortunately he seems to be doing excellent at this point. There does not appear to be any evidence of infection. 12/31/17 on evaluation today patient appears to be doing excellent in regard to his right lower extremity ulcer. He has been tolerating the dressing changes without complication. The wrap appears to have been very beneficial for him up to this point. With that being said he shows no signs of infection at this time and overall I'm very pleased with how things seem to be progressing. 01/07/18 on evaluation today patient actually appears to be doing excellent in regard to his ulcer. In fact there was a lot of dried dressing material noted covering the wound during evaluation today. Once I begin to remove this utilizing a curette I was noted that the patient actually had significant epithelialization underlying this event was just a very small region that appears to still be open otherwise the remainder appears to be closed. Obviously this is excellent news and I think he is progressing very nicely. 01/14/18-he is seen in follow-up evaluation. There is dried dressing which was removed to reveal an epithelialized wound. He will be discharged from services today [place] 08/05/2019 This is a somewhat frail 86 year old man accompanied by his wife. He was in this clinic in 2009 with a traumatic  right lower extremity wound. He comes ISOM, KOCHAN. ([id number]) in this time having suffered a fall on 07/07/2019 primary physician noted a hematoma and x-ray of the arm was negative. It was a bit difficult difficult to figure this out but looking through home health link however his wife says this area actually closed over and the hematoma resolved however he had a second fall on 07/29/2019 with a more substantial hematoma involving the lateral aspect of the right elbow a skin tear over this area and a substantial amount of bleeding in the posterior upper humerus area. Again an x-ray was negative. He has been applying Vaseline gauze twice daily with the help of his wife. The wound was debrided by primary care on 07/31/2019. His wife states that she is overwhelmed with the idea of having to care for this wound and request home health. And is much as a can guarantee anything these days with home health we will try to get somebody out to help her with the dressing. The idea of stopping the Eliquis that he is on for chronic atrial fibrillation in an elderly man who falls has been brought up before and apparently was done once with the assistance of Dr. Tamala Julian his cardiologist in [place] however he apparently had a mini stroke shortly thereafter so he is back on Eliquis. He apparently has gait instability he has a walker Past medical history; atrial fibrillation on Eliquis COPD, history of cerebrovascular disease, hypertension, diastolic heart failure COPD and coronary artery disease 08/12/2019; this is a patient with a substantial hematoma over the lateral  epicondyle of the right elbow. I left this intact last week and wrapped with compression however he arrives in clinic this week with 3 gaping holes in the epithelialization over the hematoma. I did not think any of this was going to be viable. I was hoping to keep this intact a little while longer however I do not think this is going to be the  case. He required an extensive debridement silver alginate to the resultant wound 08/18/2019 upon evaluation today patient is seen by myself for the first time today regarding the hematoma and injury on his right elbow location where he has a skin tear as well. Unfortunately he still has some hematoma remaining in the proximal portion of the wound. Fortunately there is no signs of active infection at this time. No fevers, chills, nausea, vomiting, or diarrhea. With that being said he has not had any additional falls which is good news and overall it does look like this is getting better compared to what it was when he first came back into the clinic. There has been some concern over whether or not he should continue with the blood thinners secondary to the fact that again he keeps having falls and again this could be detrimental if he were to strike his head. Nonetheless I am not sure there is been a consensus with his cardiologist yet as to whether or not the blood thinner should be stopped. 08/25/2019 upon evaluation today patient actually appears to be doing excellent in regard to his forearm ulcer. He has been tolerating the dressing changes without complication. Fortunately there is no signs of active infection at this time. No fevers, chills, nausea, vomiting, or diarrhea. 09/01/2019 upon evaluation today patient appears to be doing very well with regard to his wound. This is extremely small even compared to last week and overall he is progressing quite nicely. 09/08/2019 upon evaluation today patient appears to be doing excellent in regard to his Elbow ulcer. Overall this is showing signs of excellent improvement and seems to be doing extremely well. I see no signs of infection and to be honest I think he is all but healed at this point. There was some hyper granulation tissue Objective Constitutional Well-nourished and well-hydrated in no acute distress. Vitals Time Taken: 11:15 AM, Height: 62  in, Weight: 141 lbs, BMI: 25.8, Temperature: 97.8 F, Pulse: 54 bpm, Respiratory Rate: 16 breaths/min, Blood Pressure: 127/49 mmHg. Respiratory normal breathing without difficulty. Psychiatric this patient is able to make decisions and demonstrates good insight into disease process. Alert and Oriented x 3. pleasant and cooperative. General Notes: Patient's wound again showed signs of hyper granulation there does not appear to be any evidence of infection which is great news. No fevers, chills, nausea, vomiting, or diarrhea. Integumentary (Hair, Skin) Wound #2 status is Open. Original cause of wound was Trauma. The wound is located on the Right Elbow. The wound measures 1.3cm length x 0.5cm width x 0.1cm depth; 0.511cm^2 area and 0.051cm^3 volume. There is Fat Layer (Subcutaneous Tissue) Exposed exposed. There is no tunneling or undermining noted. There is a medium amount of serosanguineous drainage noted. There is large (67-100%) red granulation within the wound bed. There is a small (1-33%) amount of necrotic tissue within the wound bed including Eschar and Adherent Slough. Ringgenberg, Yerik M. ([id number]) Assessment Active Problems ICD-10 Contusion of right upper arm, subsequent encounter Adverse effect of anticoagulants, sequela Procedures Wound #2 Pre-procedure diagnosis of Wound #2 is a Skin Tear located on the Right  Elbow . An CHEM CAUT GRANULATION TISS procedure was performed by STONE III, Tejah Brekke E., PA-C. Post procedure Diagnosis Wound #2: Same as Pre-Procedure Notes: 1 stick of silver nitrate used Plan Wound Cleansing: Wound #2 Right Elbow: Clean wound with Normal Saline. Anesthetic (add to Medication List): Wound #2 Right Elbow: Topical Lidocaine 4% cream applied to wound bed prior to debridement (In Clinic Only). Primary Wound Dressing: Wound #2 Right Elbow: Hydrafera Blue Ready Transfer Secondary Dressing: Wound #2 Right Elbow: ABD and Kerlix/Conform - secure with  netting Dressing Change Frequency: Wound #2 Right Elbow: Change Dressing Monday, Wednesday, Friday Follow-up Appointments: Wound #2 Right Elbow: Return Appointment in 1 week. Additional Orders / Instructions: Wound #2 Right Elbow: Activity as tolerated 1. My suggestion at this time is good to be that we go ahead and switch to [facility] dressing I think this may be best for the patient he is in agreement with the plan. 2. I am also can recommend that we go ahead and have him continue to monitor for any signs of infection obviously I do believe at this point that the patient seems to be doing very well with regard to his elbow ulcer and I think the [facility] Blue will help in this regard. We will see patient back for reevaluation in 1 week here in the clinic. If anything worsens or changes patient will contact our office for additional recommendations. Electronic Signature(s) Signed: 09/09/2019 9:27:00 PM By: Worthy Keeler PA-C Previous Signature: 09/09/2019 9:23:23 PM Version By: Worthy Keeler PA-C Entered By: Worthy Keeler on 09/09/2019 21:27:00 [name], [name] ([id number]) [address] [name], [name] ([id number]) -------------------------------------------------------------------------------- SuperBill Details Patient Name: Eduardo [name]. Date of Service: 09/08/2019 Medical Record Number: [id number] Patient Account Number: [id number] Date of Birth/Sex: [birth year] (86 y.o. M) Treating RN: [name] Primary Care Provider: [name] Other Clinician: Referring Provider: [name] Treating Provider/Extender: Melburn Hake, Yahayra Geis Weeks in Treatment: 4 Diagnosis Coding ICD-10 Codes Code Description S40.021D Contusion of right upper arm, subsequent encounter T45.515S Adverse effect of anticoagulants, sequela Facility Procedures CPT4 Code: 24401027 Description: 25366 - CHEM CAUT GRANULATION TISS Modifier: Quantity: 1 CPT4 Code: Description: ICD-10 Diagnosis  Description S40.021D Contusion of right upper arm, subsequent encounter Modifier: Quantity: Physician Procedures CPT4 Code: 4403474 Description: 25956 - WC PHYS CHEM CAUT GRAN TISSUE Modifier: Quantity: 1 CPT4 Code: Description: ICD-10 Diagnosis Description S40.021D Contusion of right upper arm, subsequent encounter Modifier: Quantity: Electronic Signature(s) Signed: 09/09/2019 9:27:40 PM By: Worthy Keeler PA-C Entered By: Worthy Keeler on 09/09/2019 21:27:40

## 2019-09-11 NOTE — Telephone Encounter (Signed)
Pt last saw Dr Rockey Situ 08/06/19, last labs 03/19/19 Creat 1.01, age 86, weight 61.2kg, based on specified criteria pt is on appropriate dosage of Eliquis 5mg  BID.  Will refill rx.

## 2019-09-14 ENCOUNTER — Other Ambulatory Visit: Payer: Self-pay | Admitting: Emergency Medicine

## 2019-09-14 ENCOUNTER — Telehealth: Payer: Self-pay | Admitting: *Deleted

## 2019-09-14 DIAGNOSIS — I482 Chronic atrial fibrillation, unspecified: Secondary | ICD-10-CM | POA: Diagnosis not present

## 2019-09-14 DIAGNOSIS — K219 Gastro-esophageal reflux disease without esophagitis: Secondary | ICD-10-CM | POA: Diagnosis not present

## 2019-09-14 DIAGNOSIS — R0781 Pleurodynia: Secondary | ICD-10-CM | POA: Diagnosis not present

## 2019-09-14 DIAGNOSIS — S41101D Unspecified open wound of right upper arm, subsequent encounter: Secondary | ICD-10-CM | POA: Diagnosis not present

## 2019-09-14 DIAGNOSIS — Z87891 Personal history of nicotine dependence: Secondary | ICD-10-CM | POA: Diagnosis not present

## 2019-09-14 DIAGNOSIS — I5032 Chronic diastolic (congestive) heart failure: Secondary | ICD-10-CM | POA: Diagnosis not present

## 2019-09-14 DIAGNOSIS — G8911 Acute pain due to trauma: Secondary | ICD-10-CM | POA: Diagnosis not present

## 2019-09-14 DIAGNOSIS — I051 Rheumatic mitral insufficiency: Secondary | ICD-10-CM | POA: Diagnosis not present

## 2019-09-14 DIAGNOSIS — J449 Chronic obstructive pulmonary disease, unspecified: Secondary | ICD-10-CM | POA: Diagnosis not present

## 2019-09-14 DIAGNOSIS — M159 Polyosteoarthritis, unspecified: Secondary | ICD-10-CM | POA: Diagnosis not present

## 2019-09-14 DIAGNOSIS — I110 Hypertensive heart disease with heart failure: Secondary | ICD-10-CM | POA: Diagnosis not present

## 2019-09-14 DIAGNOSIS — Z7901 Long term (current) use of anticoagulants: Secondary | ICD-10-CM | POA: Diagnosis not present

## 2019-09-14 DIAGNOSIS — Z9181 History of falling: Secondary | ICD-10-CM | POA: Diagnosis not present

## 2019-09-14 DIAGNOSIS — I2510 Atherosclerotic heart disease of native coronary artery without angina pectoris: Secondary | ICD-10-CM | POA: Diagnosis not present

## 2019-09-14 DIAGNOSIS — I252 Old myocardial infarction: Secondary | ICD-10-CM | POA: Diagnosis not present

## 2019-09-14 DIAGNOSIS — M48061 Spinal stenosis, lumbar region without neurogenic claudication: Secondary | ICD-10-CM | POA: Diagnosis not present

## 2019-09-14 DIAGNOSIS — I6523 Occlusion and stenosis of bilateral carotid arteries: Secondary | ICD-10-CM | POA: Diagnosis not present

## 2019-09-14 DIAGNOSIS — F39 Unspecified mood [affective] disorder: Secondary | ICD-10-CM | POA: Diagnosis not present

## 2019-09-14 NOTE — Telephone Encounter (Signed)
Noted.   Will route to MA to call patient and encourage him to follow his treatment plan of taking additional lasix if he has gained weight.   Also to ask patient to call back tomorrow with update on how he is feeling and what his weights have been for Dr. [name] to review.

## 2019-09-14 NOTE — Telephone Encounter (Signed)
Patient states that his weight was around 140 lbs on 09/12/19, on 09/13/19 was around 139 lbs ish, and today was around 138 lbs per patient. Patient takes Lasix 40 mg 1 tablet daily. His O2 levels have been around 95% and above on average. This morning 02 was 91%, not able to re check his 02 sat while on the phone with me. Feels like some of the tree pollen is affecting him more, maybe a little worsening in his breathing at night, when he goes to bed and lays down. States it feels like its an exertion to get in bed, he has noticed this in the past 1 1/2 weeks. No chest tightness, no chest pressure. Left leg is more swollen than his right leg. In the morning when he wakes up both legs look the same but then left leg gets more swollen during the day. Patient states his left leg does have some redness present on the sides of the legs below knee area (not in the calf area). No fever. No pain in the leg. Patient will go ahead and take an extra Lasix 40 mg today.  Patient was advised that I will send this note to Dr Einar Pheasant and Dr [name] to see if anything else further is needed but advised patient if we do not call back today we will follow up with patient tomorrow once Dr [name] reviews for any further recommendations.  Patient states he does have an appointment with wound center tomorrow and if we call him back tomorrow it needs to be around 2 pm.

## 2019-09-14 NOTE — Telephone Encounter (Signed)
Spoke to pt.  1. He will update [place] tomorrow about the swelling and the extra Lasix tonight. 2. He will try one of the allergy medications. Does not think that is an issue, though. Usually allergic to ragweed. 3. He will look in to the support socks.

## 2019-09-14 NOTE — Telephone Encounter (Signed)
He might want to consider an antihistamine--like loratadine 10mg  or cetirizine 10mg , perhaps at bedtime (in case this is his allergies) He should consider support socks if he doesn't already wear them

## 2019-09-14 NOTE — Telephone Encounter (Signed)
Tillie Rung PT left a voicemail stating that she is seeing the patient. Tillie Rung stated that she has some concerns because patient gained a couple of pounds overnight. Tillie Rung stated that the patient told her he ate ham for Easter. Tillie Rung stated that she advised the patient to use the protocol on his Lasix to take an extra pill. Tillie Rung stated that the patient told her he did not want to do that because he takes so much medication already. Tillie Rung stated  that patient had weight gain last week and had to take an extra Lasix. Tillie Rung stated that she just needed to report this to Dr. [name].

## 2019-09-15 ENCOUNTER — Encounter: Payer: PPO | Attending: Physician Assistant | Admitting: Physician Assistant

## 2019-09-15 ENCOUNTER — Other Ambulatory Visit: Payer: Self-pay

## 2019-09-15 DIAGNOSIS — I4891 Unspecified atrial fibrillation: Secondary | ICD-10-CM | POA: Diagnosis not present

## 2019-09-15 DIAGNOSIS — G629 Polyneuropathy, unspecified: Secondary | ICD-10-CM | POA: Insufficient documentation

## 2019-09-15 DIAGNOSIS — S40021A Contusion of right upper arm, initial encounter: Secondary | ICD-10-CM | POA: Diagnosis not present

## 2019-09-15 DIAGNOSIS — T45515S Adverse effect of anticoagulants, sequela: Secondary | ICD-10-CM | POA: Diagnosis not present

## 2019-09-15 DIAGNOSIS — S40021D Contusion of right upper arm, subsequent encounter: Secondary | ICD-10-CM | POA: Insufficient documentation

## 2019-09-15 DIAGNOSIS — J449 Chronic obstructive pulmonary disease, unspecified: Secondary | ICD-10-CM | POA: Insufficient documentation

## 2019-09-15 DIAGNOSIS — T45515A Adverse effect of anticoagulants, initial encounter: Secondary | ICD-10-CM | POA: Diagnosis not present

## 2019-09-15 DIAGNOSIS — I252 Old myocardial infarction: Secondary | ICD-10-CM | POA: Insufficient documentation

## 2019-09-15 DIAGNOSIS — Z7901 Long term (current) use of anticoagulants: Secondary | ICD-10-CM | POA: Diagnosis not present

## 2019-09-15 DIAGNOSIS — I5032 Chronic diastolic (congestive) heart failure: Secondary | ICD-10-CM | POA: Insufficient documentation

## 2019-09-15 DIAGNOSIS — I110 Hypertensive heart disease with heart failure: Secondary | ICD-10-CM | POA: Insufficient documentation

## 2019-09-15 DIAGNOSIS — I2510 Atherosclerotic heart disease of native coronary artery without angina pectoris: Secondary | ICD-10-CM | POA: Diagnosis not present

## 2019-09-16 NOTE — Progress Notes (Addendum)
Eduardo [name], Eduardo [name] ([id number]) Visit Report for 09/15/2019 Chief Complaint Document Details Patient Name: Eduardo [name], Eduardo [name]. Date of Service: 09/15/2019 11:00 AM Medical Record Number: [id number] Patient Account Number: [id number] Date of Birth/Sex: 07/08/1930 (86 y.o. M) Treating RN: [name] Primary Care Provider: [name] Other Clinician: Referring Provider: [name] Treating Provider/Extender: Melburn Hake, Leenah Seidner Weeks in Treatment: 5 Information Obtained from: Patient Chief Complaint Right lower leg ulcer due to trauma 08/05/2019; patient is here for review of a hematoma and skin tear on the lateral part of the right elbow Electronic Signature(s) Signed: 09/15/2019 11:01:03 AM By: Worthy Keeler PA-C Entered By: Worthy Keeler on 09/15/2019 11:01:02 Joyce, Reegan M. ([id number]) -------------------------------------------------------------------------------- HPI Details Patient Name: Eduardo [name]. Date of Service: 09/15/2019 11:00 AM Medical Record Number: [id number] Patient Account Number: [id number] Date of Birth/Sex: 1930-12-10 (86 y.o. M) Treating RN: [name] Primary Care Provider: [name] Other Clinician: Referring Provider: [name] Treating Provider/Extender: Melburn Hake, Roby Donaway Weeks in Treatment: 5 History of Present Illness HPI Description: 11/08/17 on evaluation today patient presents initially concerning a right dramatic lower extremity ulcer/skin tear which occurred three weeks ago. He states that he initially was seen in urgent care where the area was sutured closed. The sutures stayed in for two weeks he tells me. Subsequently he was seen at Dr. Alla German office where the sutures were removed and Steri-Strips were placed over the next five days. Subsequently these were also removed and unfortunately at this time it was noted that the patient had a dehisced larger wound that required further attention. The patient and his  wife actually requested referral to the wound care center in [place]. [name] made the referral to [place]. Subsequently the patient does have a history of atrial fibrillation, cardiovascular disease, COPD, stroke, and is on long-term Eliquis. There's no history of diabetes that he states. Subsequently he has no other major medical problems currently. No fevers, chills, nausea, or vomiting noted at this time. 11/14/17-He is seen in follow-up evaluation for right posterior lower extremity ulcer. There is improvement with granulation tissue present; debridement. We will continue with same treatment plan he'll follow-up next week. The vascular office did call to establish an appointment, but he forgot that we had referred him and did not make an appointment. He was encouraged to contact the office to schedule an appointment for evaluation. 11/22/17 on evaluation today patient actually appears to be doing well in regard to his ulcer. This has cleaned up very nicely in my pinion I do believe the Iodoflex has been of benefit for him. He actually does have his arterial study which is scheduled for 11/27/17. In general he's having no significant discomfort mainly just with cleansing of the wound but the dressings themselves don't seem to be causing him problems. 11/29/17 on evaluation today patient appears to be doing very well in regard to his lower extremity ulcer. With that being said he unfortunately prior to coming in for evaluation today did have a trip over a world where he failed hurting his back and striking his head. With that being said he has not had any altered mental status since that time and states that he's not really any more dizzy and there does not appear to be any residual effect from this. He also does not have a significant headache. With that being said this is something I did check out for him today briefly at least since he was in our office. I do think that he needs to  look out for any signs or  symptoms of anything worsening such as nausea, vomiting, fever, chills, confusion, or severe headaches. He has no visual disturbance at this point. 12/06/17 on evaluation today patient appears to be doing excellent in regard to his lower extremity ulcer. This is starting to really fill in which is good news as well. We do have the results of the arterial study unfortunately they did not perform the TBI portion of the examination and his ABI's were noncompressible. With that being said they did state that he had triphasic blood flow throughout the bilateral lower extremities and there was apparently no evidence of arterial disease on the arterial ultrasound. Overall this is good news we will be able to initiate stronger compression therapy which should help to allow this wound to heal even faster as the patient still continues to have some pitting edema. 12/10/17 on evaluation today patient appears to be doing much better in regard to his lower extremity ulcer. He has been tolerating the dressing changes without complication. Fortunately the compression wrap seems to be doing excellent at this time and overall he is showing signs of good improvement. I'm happy with the overall progress that has been made over the past several weeks but especially in the past week. The patient and his wife both are extremely pleased. 12/18/17; right posterior calf ulcer. Healthy granulation. Dimension slightly smaller. Using silver collagen under compression 12/24/17 on evaluation today patient presents for follow-up concerning his ongoing right lower extremity ulcer. He has been tolerating the dressing changes without complication. Fortunately he seems to be doing excellent at this point. There does not appear to be any evidence of infection. 12/31/17 on evaluation today patient appears to be doing excellent in regard to his right lower extremity ulcer. He has been tolerating the dressing changes without complication. The  wrap appears to have been very beneficial for him up to this point. With that being said he shows no signs of infection at this time and overall I'm very pleased with how things seem to be progressing. 01/07/18 on evaluation today patient actually appears to be doing excellent in regard to his ulcer. In fact there was a lot of dried dressing material noted covering the wound during evaluation today. Once I begin to remove this utilizing a curette I was noted that the patient actually had significant epithelialization underlying this event was just a very small region that appears to still be open otherwise the remainder appears to be closed. Obviously this is excellent news and I think he is progressing very nicely. 01/14/18-he is seen in follow-up evaluation. There is dried dressing which was removed to reveal an epithelialized wound. He will be discharged from services today [place] 08/05/2019 This is a somewhat frail 86 year old man accompanied by his wife. He was in this clinic in 2009 with a traumatic right lower extremity wound. He comes in this time having suffered a fall on 07/07/2019 primary physician noted a hematoma and x-ray of the arm was negative. It was a bit difficult difficult to figure this out but looking through home health link however his wife says this area actually closed over and the hematoma resolved however he had a second fall on 07/29/2019 with a more substantial hematoma involving the lateral aspect of the right elbow a skin tear over this area and a substantial amount of bleeding in the posterior upper humerus area. Again an x-ray was negative. He has been applying Vaseline gauze twice daily with the help  of his wife. The wound was debrided by primary care on 07/31/2019. EBERARDO, DEMELLO ([id number]) His wife states that she is overwhelmed with the idea of having to care for this wound and request home health. And is much as a can guarantee anything these days with  home health we will try to get somebody out to help her with the dressing. The idea of stopping the Eliquis that he is on for chronic atrial fibrillation in an elderly man who falls has been brought up before and apparently was done once with the assistance of Dr. Tamala Julian his cardiologist in [place] however he apparently had a mini stroke shortly thereafter so he is back on Eliquis. He apparently has gait instability he has a walker Past medical history; atrial fibrillation on Eliquis COPD, history of cerebrovascular disease, hypertension, diastolic heart failure COPD and coronary artery disease 08/12/2019; this is a patient with a substantial hematoma over the lateral epicondyle of the right elbow. I left this intact last week and wrapped with compression however he arrives in clinic this week with 3 gaping holes in the epithelialization over the hematoma. I did not think any of this was going to be viable. I was hoping to keep this intact a little while longer however I do not think this is going to be the case. He required an extensive debridement silver alginate to the resultant wound 08/18/2019 upon evaluation today patient is seen by myself for the first time today regarding the hematoma and injury on his right elbow location where he has a skin tear as well. Unfortunately he still has some hematoma remaining in the proximal portion of the wound. Fortunately there is no signs of active infection at this time. No fevers, chills, nausea, vomiting, or diarrhea. With that being said he has not had any additional falls which is good news and overall it does look like this is getting better compared to what it was when he first came back into the clinic. There has been some concern over whether or not he should continue with the blood thinners secondary to the fact that again he keeps having falls and again this could be detrimental if he were to strike his head. Nonetheless I am not sure there is been a  consensus with his cardiologist yet as to whether or not the blood thinner should be stopped. 08/25/2019 upon evaluation today patient actually appears to be doing excellent in regard to his forearm ulcer. He has been tolerating the dressing changes without complication. Fortunately there is no signs of active infection at this time. No fevers, chills, nausea, vomiting, or diarrhea. 09/01/2019 upon evaluation today patient appears to be doing very well with regard to his wound. This is extremely small even compared to last week and overall he is progressing quite nicely. 09/08/2019 upon evaluation today patient appears to be doing excellent in regard to his Elbow ulcer. Overall this is showing signs of excellent improvement and seems to be doing extremely well. I see no signs of infection and to be honest I think he is all but healed at this point. There was some hyper granulation tissue 09/15/2019 upon evaluation today patient actually appears to be doing excellent. In fact he appears to be completely healed which is great news. There is no signs of active infection currently that is excellent as well. Electronic Signature(s) Signed: 09/18/2019 9:57:59 AM By: Worthy Keeler PA-C Entered By: Worthy Keeler on 09/18/2019 09:57:59 Eduardo [name], [name] ([id number]) -------------------------------------------------------------------------------- Physical  Exam Details Patient Name: DEWAYNE, SEVERE. Date of Service: 09/15/2019 11:00 AM Medical Record Number: [id number] Patient Account Number: [id number] Date of Birth/Sex: 1930-08-31 (86 y.o. M) Treating RN: [name] Primary Care Provider: [name] Other Clinician: Referring Provider: [name] Treating Provider/Extender: Melburn Hake, Eleesha Purkey Weeks in Treatment: 5 Constitutional Well-nourished and well-hydrated in no acute distress. Respiratory normal breathing without difficulty. Psychiatric this patient is able to make decisions  and demonstrates good insight into disease process. Alert and Oriented x 3. pleasant and cooperative. Notes Patient's wound bed currently showed signs of great epithelization at this point. There is no evidence of open wound and though this does need to be protected with said things such as a longsleeve shirt or even potentially a cover dressing just for protection I do not believe that he has to have anything on this other than just being very careful. Electronic Signature(s) Signed: 09/18/2019 9:58:25 AM By: Worthy Keeler PA-C Entered By: Worthy Keeler on 09/18/2019 09:58:25 Eduardo [name], [name] ([id number]) -------------------------------------------------------------------------------- Physician Orders Details Patient Name: Eduardo [name]. Date of Service: 09/15/2019 11:00 AM Medical Record Number: [id number] Patient Account Number: [id number] Date of Birth/Sex: 1930-10-11 (86 y.o. M) Treating RN: [name] Primary Care Provider: [name] Other Clinician: Referring Provider: [name] Treating Provider/Extender: Melburn Hake, Shontavia Mickel Weeks in Treatment: 5 Verbal / Phone Orders: No Diagnosis Coding ICD-10 Coding Code Description S40.021D Contusion of right upper arm, subsequent encounter T45.515S Adverse effect of anticoagulants, sequela Discharge From [facility] o Discharge from [place] Signature(s) Signed: 09/15/2019 4:31:26 PM By: [name] Signed: 09/18/2019 10:31:16 AM By: Worthy Keeler PA-C Entered By: [name] on 09/15/2019 11:10:25 Eduardo [name], [name] ([id number]) -------------------------------------------------------------------------------- Problem List Details Patient Name: Eduardo [name]. Date of Service: 09/15/2019 11:00 AM Medical Record Number: [id number] Patient Account Number: [id number] Date of Birth/Sex: [birth year] (86 y.o. M) Treating RN: [name] Primary Care Provider: [name] Other  Clinician: Referring Provider: [name] Treating Provider/Extender: Melburn Hake, Hannah Crill Weeks in Treatment: 5 Active Problems ICD-10 Evaluated Encounter Code Description Active Date Today Diagnosis S40.021D Contusion of right upper arm, subsequent encounter 08/05/2019 No Yes T45.515S Adverse effect of anticoagulants, sequela 08/05/2019 No Yes Inactive Problems Resolved Problems Electronic Signature(s) Signed: 09/15/2019 11:00:55 AM By: Worthy Keeler PA-C Entered By: Worthy Keeler on 09/15/2019 11:00:54 Eduardo [name], [name] ([id number]) -------------------------------------------------------------------------------- Progress Note Details Patient Name: Eduardo [name]. Date of Service: 09/15/2019 11:00 AM Medical Record Number: [id number] Patient Account Number: [id number] Date of Birth/Sex: 1931/01/08 (86 y.o. M) Treating RN: [name] Primary Care Provider: [name] Other Clinician: Referring Provider: [name] Treating Provider/Extender: Melburn Hake, Alura Olveda Weeks in Treatment: 5 Subjective Chief Complaint Information obtained from Patient Right lower leg ulcer due to trauma 08/05/2019; patient is here for review of a hematoma and skin tear on the lateral part of the right elbow History of Present Illness (HPI) 11/08/17 on evaluation today patient presents initially concerning a right dramatic lower extremity ulcer/skin tear which occurred three weeks ago. He states that he initially was seen in urgent care where the area was sutured closed. The sutures stayed in for two weeks he tells me. Subsequently he was seen at Dr. Alla German office where the sutures were removed and Steri-Strips were placed over the next five days. Subsequently these were also removed and unfortunately at this time it was noted that the patient had a dehisced larger wound that required further attention. The patient and his wife actually requested referral  to the wound care center in [place].  [name] made the referral to [place]. Subsequently the patient does have a history of atrial fibrillation, cardiovascular disease, COPD, stroke, and is on long-term Eliquis. There's no history of diabetes that he states. Subsequently he has no other major medical problems currently. No fevers, chills, nausea, or vomiting noted at this time. 11/14/17-He is seen in follow-up evaluation for right posterior lower extremity ulcer. There is improvement with granulation tissue present; debridement. We will continue with same treatment plan he'll follow-up next week. The vascular office did call to establish an appointment, but he forgot that we had referred him and did not make an appointment. He was encouraged to contact the office to schedule an appointment for evaluation. 11/22/17 on evaluation today patient actually appears to be doing well in regard to his ulcer. This has cleaned up very nicely in my pinion I do believe the Iodoflex has been of benefit for him. He actually does have his arterial study which is scheduled for 11/27/17. In general he's having no significant discomfort mainly just with cleansing of the wound but the dressings themselves don't seem to be causing him problems. 11/29/17 on evaluation today patient appears to be doing very well in regard to his lower extremity ulcer. With that being said he unfortunately prior to coming in for evaluation today did have a trip over a world where he failed hurting his back and striking his head. With that being said he has not had any altered mental status since that time and states that he's not really any more dizzy and there does not appear to be any residual effect from this. He also does not have a significant headache. With that being said this is something I did check out for him today briefly at least since he was in our office. I do think that he needs to look out for any signs or symptoms of anything worsening such as nausea, vomiting, fever,  chills, confusion, or severe headaches. He has no visual disturbance at this point. 12/06/17 on evaluation today patient appears to be doing excellent in regard to his lower extremity ulcer. This is starting to really fill in which is good news as well. We do have the results of the arterial study unfortunately they did not perform the TBI portion of the examination and his ABI's were noncompressible. With that being said they did state that he had triphasic blood flow throughout the bilateral lower extremities and there was apparently no evidence of arterial disease on the arterial ultrasound. Overall this is good news we will be able to initiate stronger compression therapy which should help to allow this wound to heal even faster as the patient still continues to have some pitting edema. 12/10/17 on evaluation today patient appears to be doing much better in regard to his lower extremity ulcer. He has been tolerating the dressing changes without complication. Fortunately the compression wrap seems to be doing excellent at this time and overall he is showing signs of good improvement. I'm happy with the overall progress that has been made over the past several weeks but especially in the past week. The patient and his wife both are extremely pleased. 12/18/17; right posterior calf ulcer. Healthy granulation. Dimension slightly smaller. Using silver collagen under compression 12/24/17 on evaluation today patient presents for follow-up concerning his ongoing right lower extremity ulcer. He has been tolerating the dressing changes without complication. Fortunately he seems to be doing excellent at this point. There  does not appear to be any evidence of infection. 12/31/17 on evaluation today patient appears to be doing excellent in regard to his right lower extremity ulcer. He has been tolerating the dressing changes without complication. The wrap appears to have been very beneficial for him up to this  point. With that being said he shows no signs of infection at this time and overall I'm very pleased with how things seem to be progressing. 01/07/18 on evaluation today patient actually appears to be doing excellent in regard to his ulcer. In fact there was a lot of dried dressing material noted covering the wound during evaluation today. Once I begin to remove this utilizing a curette I was noted that the patient actually had significant epithelialization underlying this event was just a very small region that appears to still be open otherwise the remainder appears to be closed. Obviously this is excellent news and I think he is progressing very nicely. 01/14/18-he is seen in follow-up evaluation. There is dried dressing which was removed to reveal an epithelialized wound. He will be discharged from services today [place] 08/05/2019 This is a somewhat frail 86 year old man accompanied by his wife. He was in this clinic in 2009 with a traumatic right lower extremity wound. He comes Eduardo [name], Eduardo [name]. ([id number]) in this time having suffered a fall on 07/07/2019 primary physician noted a hematoma and x-ray of the arm was negative. It was a bit difficult difficult to figure this out but looking through home health link however his wife says this area actually closed over and the hematoma resolved however he had a second fall on 07/29/2019 with a more substantial hematoma involving the lateral aspect of the right elbow a skin tear over this area and a substantial amount of bleeding in the posterior upper humerus area. Again an x-ray was negative. He has been applying Vaseline gauze twice daily with the help of his wife. The wound was debrided by primary care on 07/31/2019. His wife states that she is overwhelmed with the idea of having to care for this wound and request home health. And is much as a can guarantee anything these days with home health we will try to get somebody out to help her with  the dressing. The idea of stopping the Eliquis that he is on for chronic atrial fibrillation in an elderly man who falls has been brought up before and apparently was done once with the assistance of Dr. Tamala Julian his cardiologist in [place] however he apparently had a mini stroke shortly thereafter so he is back on Eliquis. He apparently has gait instability he has a walker Past medical history; atrial fibrillation on Eliquis COPD, history of cerebrovascular disease, hypertension, diastolic heart failure COPD and coronary artery disease 08/12/2019; this is a patient with a substantial hematoma over the lateral epicondyle of the right elbow. I left this intact last week and wrapped with compression however he arrives in clinic this week with 3 gaping holes in the epithelialization over the hematoma. I did not think any of this was going to be viable. I was hoping to keep this intact a little while longer however I do not think this is going to be the case. He required an extensive debridement silver alginate to the resultant wound 08/18/2019 upon evaluation today patient is seen by myself for the first time today regarding the hematoma and injury on his right elbow location where he has a skin tear as well. Unfortunately he still has some hematoma remaining  in the proximal portion of the wound. Fortunately there is no signs of active infection at this time. No fevers, chills, nausea, vomiting, or diarrhea. With that being said he has not had any additional falls which is good news and overall it does look like this is getting better compared to what it was when he first came back into the clinic. There has been some concern over whether or not he should continue with the blood thinners secondary to the fact that again he keeps having falls and again this could be detrimental if he were to strike his head. Nonetheless I am not sure there is been a consensus with his cardiologist yet as to whether or not the  blood thinner should be stopped. 08/25/2019 upon evaluation today patient actually appears to be doing excellent in regard to his forearm ulcer. He has been tolerating the dressing changes without complication. Fortunately there is no signs of active infection at this time. No fevers, chills, nausea, vomiting, or diarrhea. 09/01/2019 upon evaluation today patient appears to be doing very well with regard to his wound. This is extremely small even compared to last week and overall he is progressing quite nicely. 09/08/2019 upon evaluation today patient appears to be doing excellent in regard to his Elbow ulcer. Overall this is showing signs of excellent improvement and seems to be doing extremely well. I see no signs of infection and to be honest I think he is all but healed at this point. There was some hyper granulation tissue 09/15/2019 upon evaluation today patient actually appears to be doing excellent. In fact he appears to be completely healed which is great news. There is no signs of active infection currently that is excellent as well. Objective Constitutional Well-nourished and well-hydrated in no acute distress. Vitals Time Taken: 11:01 AM, Height: 62 in, Weight: 141 lbs, BMI: 25.8, Temperature: 97.6 F, Pulse: 65 bpm, Respiratory Rate: 16 breaths/min, Blood Pressure: 117/68 mmHg. Respiratory normal breathing without difficulty. Psychiatric this patient is able to make decisions and demonstrates good insight into disease process. Alert and Oriented x 3. pleasant and cooperative. General Notes: Patient's wound bed currently showed signs of great epithelization at this point. There is no evidence of open wound and though this does need to be protected with said things such as a longsleeve shirt or even potentially a cover dressing just for protection I do not believe that he has to have anything on this other than just being very careful. Integumentary (Hair, Skin) Wound #2 status is  Healed - Epithelialized. Original cause of wound was Trauma. The wound is located on the Right Elbow. The wound measures 0cm Eduardo [name], [name] ([id number]) length x 0cm width x 0cm depth; 0cm^2 area and 0cm^3 volume. There is a none present amount of drainage noted. There is no granulation within the wound bed. There is no necrotic tissue within the wound bed. Assessment Active Problems ICD-10 Contusion of right upper arm, subsequent encounter Adverse effect of anticoagulants, sequela Plan Discharge From [facility]: Discharge from [place] #1. I would recommend that we discontinue wound care services at this point as the patient appears to be completely healed and is doing excellent in that regard. #2. I am also going to suggest that the patient continue to monitor for anything that worsens or changes. Obviously if he has any concerns he can let me know although I really feel like he is going to do quite well with this to be honest. We will see  the patient back for follow-up visit as needed. Electronic Signature(s) Signed: 09/18/2019 9:59:06 AM By: Worthy Keeler PA-C Entered By: Worthy Keeler on 09/18/2019 09:59:06 Eduardo [name], [name] ([id number]) -------------------------------------------------------------------------------- SuperBill Details Patient Name: Eduardo [name]. Date of Service: 09/15/2019 Medical Record Number: [id number] Patient Account Number: [id number] Date of Birth/Sex: 1931/01/06 (86 y.o. M) Treating RN: [name] Primary Care Provider: [name] Other Clinician: Referring Provider: [name] Treating Provider/Extender: Melburn Hake, Marquasia Schmieder Weeks in Treatment: 5 Diagnosis Coding ICD-10 Codes Code Description S40.021D Contusion of right upper arm, subsequent encounter T45.515S Adverse effect of anticoagulants, sequela Facility Procedures CPT4 Code: 12258346 Description: [phone] - WOUND CARE VISIT-LEV 2 EST PT Modifier: Quantity:  1 Physician Procedures CPT4 Code: 1252712 Description: 92909 - WC PHYS LEVEL 2 - EST PT Modifier: Quantity: 1 CPT4 Code: Description: ICD-10 Diagnosis Description S40.021D Contusion of right upper arm, subsequent encounter T45.515S Adverse effect of anticoagulants, sequela Modifier: Quantity: Electronic Signature(s) Signed: 09/18/2019 10:31:16 AM By: Worthy Keeler PA-C Entered By: Worthy Keeler on 09/15/2019 23:51:38

## 2019-09-16 NOTE — Progress Notes (Signed)
Eduardo [name] ([id number]) Visit Report for 09/15/2019 Arrival Information Details Patient Name: Eduardo [name], Eduardo [name]. Date of Service: 09/15/2019 11:00 AM Medical Record Number: [id number] Patient Account Number: [id number] Date of Birth/Sex: 06-27-1930 (86 y.o. M) Treating RN: Army Melia Primary Care Glendel Jaggers: [name] Other Clinician: Referring Tyshun Tuckerman: [name] Treating Kenyen Candy/Extender: Melburn Hake, HOYT Weeks in Treatment: 5 Visit Information History Since Last Visit Added or deleted any medications: No Patient Arrived: Walker Any new allergies or adverse reactions: No Arrival Time: 11:00 Had a fall or experienced change in No Accompanied By: wife activities of daily living that may affect Transfer Assistance: None risk of falls: Patient Identification Verified: Yes Signs or symptoms of abuse/neglect since last visito No Hospitalized since last visit: No Has Dressing in Place as Prescribed: Yes Pain Present Now: No Electronic Signature(s) Signed: 09/15/2019 3:54:04 PM By: Army Melia Entered By: Army Melia on 09/15/2019 11:00:57 Cypert, Bowyn M. ([id number]) -------------------------------------------------------------------------------- Clinic Level of Care Assessment Details Patient Name: Eduardo [name]. Date of Service: 09/15/2019 11:00 AM Medical Record Number: [id number] Patient Account Number: [id number] Date of Birth/Sex: 1930/12/20 (86 y.o. M) Treating RN: [name] Primary Care Kaien Pezzullo: [name] Other Clinician: Referring Jaide Hillenburg: [name] Treating Rachael Zapanta/Extender: Melburn Hake, HOYT Weeks in Treatment: 5 Clinic Level of Care Assessment Items TOOL 4 Quantity Score []  - Use when only an EandM is performed on FOLLOW-UP visit 0 ASSESSMENTS - Nursing Assessment / Reassessment X - Reassessment of Co-morbidities (includes updates in patient status) 1 10 X- 1 5 Reassessment of Adherence to Treatment Plan ASSESSMENTS -  Wound and Skin Assessment / Reassessment X - Simple Wound Assessment / Reassessment - one wound 1 5 []  - 0 Complex Wound Assessment / Reassessment - multiple wounds []  - 0 Dermatologic / Skin Assessment (not related to wound area) ASSESSMENTS - Focused Assessment []  - Circumferential Edema Measurements - multi extremities 0 []  - 0 Nutritional Assessment / Counseling / Intervention []  - 0 Lower Extremity Assessment (monofilament, tuning fork, pulses) []  - 0 Peripheral Arterial Disease Assessment (using hand held doppler) ASSESSMENTS - Ostomy and/or Continence Assessment and Care []  - Incontinence Assessment and Management 0 []  - 0 Ostomy Care Assessment and Management (repouching, etc.) PROCESS - Coordination of Care X - Simple Patient / Family Education for ongoing care 1 15 []  - 0 Complex (extensive) Patient / Family Education for ongoing care X- 1 10 Staff obtains Programmer, systems, Records, Test Results / Process Orders []  - 0 Staff telephones HHA, Nursing Homes / Clarify orders / etc []  - 0 Routine Transfer to another Facility (non-emergent condition) []  - 0 Routine Hospital Admission (non-emergent condition) []  - 0 New Admissions / Biomedical engineer / Ordering NPWT, Apligraf, etc. []  - 0 Emergency Hospital Admission (emergent condition) X- 1 10 Simple Discharge Coordination []  - 0 Complex (extensive) Discharge Coordination PROCESS - Special Needs []  - Pediatric / Minor Patient Management 0 []  - 0 Isolation Patient Management []  - 0 Hearing / Language / Visual special needs []  - 0 Assessment of Community assistance (transportation, D/C planning, etc.) Ancrum, Jakobie M. ([id number]) []  - 0 Additional assistance / Altered mentation []  - 0 Support Surface(s) Assessment (bed, cushion, seat, etc.) INTERVENTIONS - Wound Cleansing / Measurement X - Simple Wound Cleansing - one wound 1 5 []  - 0 Complex Wound Cleansing - multiple wounds X- 1 5 Wound Imaging  (photographs - any number of wounds) []  - 0 Wound Tracing (instead of photographs) X- 1 5 Simple Wound Measurement - one wound []  -  0 Complex Wound Measurement - multiple wounds INTERVENTIONS - Wound Dressings []  - Small Wound Dressing one or multiple wounds 0 []  - 0 Medium Wound Dressing one or multiple wounds []  - 0 Large Wound Dressing one or multiple wounds []  - 0 Application of Medications - topical []  - 0 Application of Medications - injection INTERVENTIONS - Miscellaneous []  - External ear exam 0 []  - 0 Specimen Collection (cultures, biopsies, blood, body fluids, etc.) []  - 0 Specimen(s) / Culture(s) sent or taken to Lab for analysis []  - 0 Patient Transfer (multiple staff / Harrel Lemon Lift / Similar devices) []  - 0 Simple Staple / Suture removal (25 or less) []  - 0 Complex Staple / Suture removal (26 or more) []  - 0 Hypo / Hyperglycemic Management (close monitor of Blood Glucose) []  - 0 Ankle / Brachial Index (ABI) - do not check if billed separately X- 1 5 Vital Signs Has the patient been seen at the hospital within the last three years: Yes Total Score: 75 Level Of Care: New/Established - Level 2 Electronic Signature(s) Signed: 09/15/2019 4:31:26 PM By: [name] Entered By: [name] on 09/15/2019 11:10:42 [place], Arnez M. ([id number]) -------------------------------------------------------------------------------- Encounter Discharge Information Details Patient Name: Eduardo [name]. Date of Service: 09/15/2019 11:00 AM Medical Record Number: [id number] Patient Account Number: [id number] Date of Birth/Sex: 07-30-1930 (86 y.o. M) Treating RN: [name] Primary Care Valli Randol: [name] Other Clinician: Referring Elvin Mccartin: [name] Treating Annayah Worthley/Extender: Melburn Hake, HOYT Weeks in Treatment: 5 Encounter Discharge Information Items Discharge Condition: Stable Ambulatory Status: Walker Discharge Destination:  Home Transportation: Private Auto Accompanied By: wife Schedule Follow-up Appointment: No Clinical Summary of Care: Electronic Signature(s) Signed: 09/15/2019 4:31:26 PM By: [name] Entered By: [name] on 09/15/2019 11:11:35 Modisette, Aadith M. ([id number]) -------------------------------------------------------------------------------- Lower Extremity Assessment Details Patient Name: Eduardo [name]. Date of Service: 09/15/2019 11:00 AM Medical Record Number: [id number] Patient Account Number: [id number] Date of Birth/Sex: 10/24/1930 (86 y.o. M) Treating RN: Army Melia Primary Care Pacen Watford: [name] Other Clinician: Referring Breven Guidroz: [name] Treating Thai Hemrick/Extender: Melburn Hake, HOYT Weeks in Treatment: 5 Electronic Signature(s) Signed: 09/15/2019 3:54:04 PM By: Army Melia Entered By: Army Melia on 09/15/2019 11:02:37 Platts, Juliano M. ([id number]) -------------------------------------------------------------------------------- Multi Wound Chart Details Patient Name: Eduardo [name]. Date of Service: 09/15/2019 11:00 AM Medical Record Number: [id number] Patient Account Number: [id number] Date of Birth/Sex: 1931-02-15 (86 y.o. M) Treating RN: [name] Primary Care Blakeley Scheier: [name] Other Clinician: Referring Marq Rebello: [name] Treating Ascher Schroepfer/Extender: Melburn Hake, HOYT Weeks in Treatment: 5 Vital Signs Height(in): 62 Pulse(bpm): 24 Weight(lbs): 141 Blood Pressure(mmHg): 117/68 Body Mass Index(BMI): 26 Temperature(F): 97.6 Respiratory Rate(breaths/min): 16 Photos: [N/A:N/A] Wound Location: Right Elbow N/A N/A Wounding Event: Trauma N/A N/A Primary Etiology: Skin Tear N/A N/A Comorbid History: Glaucoma, Chronic Obstructive N/A N/A Pulmonary Disease (COPD), Arrhythmia, Congestive Heart Failure, Coronary Artery Disease, Myocardial Infarction, Neuropathy Date Acquired: 07/29/2019 N/A N/A Weeks of Treatment:  5 N/A N/A Wound Status: Healed - Epithelialized N/A N/A Measurements L x W x D (cm) 0x0x0 N/A N/A Area (cm) : 0 N/A N/A Volume (cm) : 0 N/A N/A % Reduction in Area: 100.00% N/A N/A % Reduction in Volume: 100.00% N/A N/A Classification: Full Thickness Without Exposed N/A N/A Support Structures Exudate Amount: None Present N/A N/A Granulation Amount: None Present (0%) N/A N/A Necrotic Amount: None Present (0%) N/A N/A Exposed Structures: Fascia: No N/A N/A Fat Layer (Subcutaneous Tissue) Exposed: No Tendon: No Muscle: No Joint: No Bone: No Epithelialization: Large (67-100%)  N/A N/A Treatment Notes Electronic Signature(s) Signed: 09/15/2019 4:31:26 PM By: [name] Entered By: [name] on 09/15/2019 11:10:10 Olenick, Raylyn M. ([id number]) [address] [place] M. ([id number]) -------------------------------------------------------------------------------- [place] Details Patient Name: TOBEY, SCHMELZLE. Date of Service: 09/15/2019 11:00 AM Medical Record Number: [id number] Patient Account Number: [id number] Date of Birth/Sex: 1930-10-12 (86 y.o. M) Treating RN: [name] Primary Care Yunior Jain: [name] Other Clinician: Referring Navya Timmons: [name] Treating Paije Goodhart/Extender: Melburn Hake, HOYT Weeks in Treatment: 5 Active Inactive Electronic Signature(s) Signed: 09/15/2019 4:31:26 PM By: [name] Entered By: [name] on 09/15/2019 11:10:00 Strohmeyer, Jeffrie Jerilynn Mages ([id number]) -------------------------------------------------------------------------------- Pain Assessment Details Patient Name: Eduardo [name]. Date of Service: 09/15/2019 11:00 AM Medical Record Number: [id number] Patient Account Number: [id number] Date of Birth/Sex: 1930/07/13 (86 y.o. M) Treating RN: Army Melia Primary Care Ilma Achee: [name] Other Clinician: Referring Preeti Winegardner: [name] Treating Josephene Marrone/Extender: Melburn Hake,  HOYT Weeks in Treatment: 5 Active Problems Location of Pain Severity and Description of Pain Patient Has Paino No Site Locations Pain Management and Medication Current Pain Management: Electronic Signature(s) Signed: 09/15/2019 3:54:04 PM By: Army Melia Entered By: Army Melia on 09/15/2019 11:02:02 [place]ul[place], Exodus M. ([id number]) -------------------------------------------------------------------------------- Patient/Caregiver Education Details Patient Name: Eduardo [name]. Date of Service: 09/15/2019 11:00 AM Medical Record Number: [id number] Patient Account Number: [id number] Date of Birth/Gender: Jan 22, 1931 (86 y.o. M) Treating RN: [name] Primary Care Physician: [name] Other Clinician: Referring Physician: [name] Treating Physician/Extender: Sharalyn Ink in Treatment: 5 Education Assessment Education Provided To: Patient and Caregiver Education Topics Provided Basic Hygiene: Handouts: Other: scar care Methods: Explain/Verbal Responses: State content correctly Electronic Signature(s) Signed: 09/15/2019 4:31:26 PM By: [name] Entered By: [name] on 09/15/2019 11:11:16 Sorci, Millan M. ([id number]) -------------------------------------------------------------------------------- Wound Assessment Details Patient Name: Eduardo [name]. Date of Service: 09/15/2019 11:00 AM Medical Record Number: [id number] Patient Account Number: [id number] Date of Birth/Sex: [birth year] (86 y.o. M) Treating RN: [name] Primary Care Vienne Corcoran: [name] Other Clinician: Referring Merri Dimaano: [name] Treating Reaghan Kawa/Extender: Melburn Hake, HOYT Weeks in Treatment: 5 Wound Status Wound Number: 2 Primary Skin Tear Etiology: Wound Location: Right Elbow Wound Healed - Epithelialized Wounding Event: Trauma Status: Date Acquired: 07/29/2019 Comorbid Glaucoma, Chronic Obstructive Pulmonary Disease (COPD), Weeks Of  Treatment: 5 History: Arrhythmia, Congestive Heart Failure, Coronary Artery Clustered Wound: No Disease, Myocardial Infarction, Neuropathy Photos Wound Measurements Length: (cm) Width: (cm) Depth: (cm) Area: (cm) Volume: (cm) 0 % Reduction in Area: 100% 0 % Reduction in Volume: 100% 0 Epithelialization: Large (67-100%) 0 0 Wound Description Classification: Full Thickness Without Exposed Support Structure Exudate Amount: None Present s Foul Odor After Cleansing: No Slough/Fibrino Yes Wound Bed Granulation Amount: None Present (0%) Exposed Structure Necrotic Amount: None Present (0%) Fascia Exposed: No Fat Layer (Subcutaneous Tissue) Exposed: No Tendon Exposed: No Muscle Exposed: No Joint Exposed: No Bone Exposed: No Electronic Signature(s) Signed: 09/15/2019 4:31:26 PM By: [name] Entered By: [name] on 09/15/2019 11:09:42 Flenner, Tiwan M. ([id number]) -------------------------------------------------------------------------------- [place] Details Patient Name: Eduardo [name]. Date of Service: 09/15/2019 11:00 AM Medical Record Number: [id number] Patient Account Number: [id number] Date of Birth/Sex: 10/19/1930 (86 y.o. M) Treating RN: Army Melia Primary Care Hendry Speas: [name] Other Clinician: Referring Girlie Veltri: [name] Treating Sai Moura/Extender: Melburn Hake, HOYT Weeks in Treatment: 5 Vital Signs Time Taken: 11:01 Temperature (F): 97.6 Height (in): 62 Pulse (bpm): 65 Weight (lbs): 141 Respiratory Rate (breaths/min): 16 Body Mass Index (BMI): 25.8 Blood Pressure (mmHg): 117/68 Reference Range: 80 - 120 mg /  dl Electronic Signature(s) Signed: 09/15/2019 3:54:04 PM By: Army Melia Entered By: Army Melia on 09/15/2019 11:01:52

## 2019-09-21 DIAGNOSIS — S41101D Unspecified open wound of right upper arm, subsequent encounter: Secondary | ICD-10-CM | POA: Diagnosis not present

## 2019-09-21 DIAGNOSIS — I2510 Atherosclerotic heart disease of native coronary artery without angina pectoris: Secondary | ICD-10-CM | POA: Diagnosis not present

## 2019-09-21 DIAGNOSIS — I252 Old myocardial infarction: Secondary | ICD-10-CM | POA: Diagnosis not present

## 2019-09-21 DIAGNOSIS — I051 Rheumatic mitral insufficiency: Secondary | ICD-10-CM | POA: Diagnosis not present

## 2019-09-21 DIAGNOSIS — J449 Chronic obstructive pulmonary disease, unspecified: Secondary | ICD-10-CM | POA: Diagnosis not present

## 2019-09-21 DIAGNOSIS — M48061 Spinal stenosis, lumbar region without neurogenic claudication: Secondary | ICD-10-CM | POA: Diagnosis not present

## 2019-09-21 DIAGNOSIS — R0781 Pleurodynia: Secondary | ICD-10-CM | POA: Diagnosis not present

## 2019-09-21 DIAGNOSIS — Z9181 History of falling: Secondary | ICD-10-CM | POA: Diagnosis not present

## 2019-09-21 DIAGNOSIS — I482 Chronic atrial fibrillation, unspecified: Secondary | ICD-10-CM | POA: Diagnosis not present

## 2019-09-21 DIAGNOSIS — F39 Unspecified mood [affective] disorder: Secondary | ICD-10-CM | POA: Diagnosis not present

## 2019-09-21 DIAGNOSIS — I6523 Occlusion and stenosis of bilateral carotid arteries: Secondary | ICD-10-CM | POA: Diagnosis not present

## 2019-09-21 DIAGNOSIS — Z7901 Long term (current) use of anticoagulants: Secondary | ICD-10-CM | POA: Diagnosis not present

## 2019-09-21 DIAGNOSIS — I110 Hypertensive heart disease with heart failure: Secondary | ICD-10-CM | POA: Diagnosis not present

## 2019-09-21 DIAGNOSIS — Z87891 Personal history of nicotine dependence: Secondary | ICD-10-CM | POA: Diagnosis not present

## 2019-09-21 DIAGNOSIS — G8911 Acute pain due to trauma: Secondary | ICD-10-CM | POA: Diagnosis not present

## 2019-09-21 DIAGNOSIS — M159 Polyosteoarthritis, unspecified: Secondary | ICD-10-CM | POA: Diagnosis not present

## 2019-09-21 DIAGNOSIS — I5032 Chronic diastolic (congestive) heart failure: Secondary | ICD-10-CM | POA: Diagnosis not present

## 2019-09-21 DIAGNOSIS — K219 Gastro-esophageal reflux disease without esophagitis: Secondary | ICD-10-CM | POA: Diagnosis not present

## 2019-09-22 ENCOUNTER — Encounter: Payer: PPO | Admitting: Physician Assistant

## 2019-09-24 ENCOUNTER — Other Ambulatory Visit: Payer: Self-pay

## 2019-09-24 ENCOUNTER — Other Ambulatory Visit: Payer: Self-pay | Admitting: Internal Medicine

## 2019-09-24 ENCOUNTER — Ambulatory Visit: Payer: PPO

## 2019-09-24 ENCOUNTER — Telehealth: Payer: Self-pay

## 2019-09-24 DIAGNOSIS — I119 Hypertensive heart disease without heart failure: Secondary | ICD-10-CM

## 2019-09-24 DIAGNOSIS — D509 Iron deficiency anemia, unspecified: Secondary | ICD-10-CM

## 2019-09-24 DIAGNOSIS — I5032 Chronic diastolic (congestive) heart failure: Secondary | ICD-10-CM

## 2019-09-24 DIAGNOSIS — I10 Essential (primary) hypertension: Secondary | ICD-10-CM

## 2019-09-24 NOTE — Telephone Encounter (Signed)
Patient has been taking ferrous sulfate 325 mg Monday, Wednesday, Friday for the past 2 weeks. He is doing well, denies constipation. Would you like to go ahead and order labs?  Debbora Dus, PharmD Clinical Pharmacist [facility] [phone]

## 2019-09-24 NOTE — Telephone Encounter (Signed)
I would wait a little longer but I put in the order.  Carrie, Please set him up for non fasting blood work in 2-3 weeks

## 2019-09-24 NOTE — Chronic Care Management (AMB) (Signed)
Chronic Care Management Pharmacy  Name: Eduardo [name]  MRN: [id number] DOB: Nov 23, 1930  Chief Complaint/ HPI  Eduardo [name],  86 y.o., male presents with his wife for his Follow-Up CCM visit with the clinical pharmacist In office.  PCP : Venia Carbon, MD   Initial CCM visit: 07/17/19  Patient concerns: Reports swelling improved with extra dose of lasix x 1 on 09/15/19, also started CoQ10 per Dr. Rockey Situ. Reports he is doing a lot of exercises to improve balance and ankle strength, working on fall prevention with physical therapist [facility]), enjoying booklet on fall risk prevention.  Office visits: none since CCM on 09/09/19  Allergies  Allergen Reactions  . Penicillins Itching    Has patient had a PCN reaction causing immediate rash, facial/tongue/throat swelling, SOB or lightheadedness with hypotension: Yes Has patient had a PCN reaction causing severe rash involving mucus membranes or skin necrosis: No Has patient had a PCN reaction that required hospitalization No Has patient had a PCN reaction occurring within the last 10 years: No If all of the above answers are "NO", then may proceed with Cephalosporin use.   . Sulfonamide Derivatives Other (See Comments)    "it affected my kidneys"  . Tape Other (See Comments)    Arms black and blue, tears skin.  Please use "paper" tape   Current Outpatient Medications on File Prior to Visit  Medication Sig Dispense Refill  . albuterol (PROVENTIL) (2.5 MG/3ML) 0.083% nebulizer solution Use 1 vial in nebulizer every 4 hours as needed for wheezing or shortness of breath 270 mL 2  . ALPRAZolam (XANAX) 0.25 MG tablet Take 1 tablet (0.25 mg total) by mouth 2 (two) times daily as needed for anxiety. 20 tablet 0  . atorvastatin (LIPITOR) 80 MG tablet Take 1 tablet (80 mg total) by mouth daily. 90 tablet 3  . B Complex-C (SUPER B COMPLEX PO) Take 1 tablet by mouth at bedtime.     . cetirizine (ZYRTEC) 10 MG tablet Take  10 mg by mouth at bedtime.     . cholecalciferol (VITAMIN D) 1000 units tablet Take 1,000 Units by mouth daily.    . Cholecalciferol (VITAMIN D3) 50 MCG (2000 UT) TABS Take 50 mcg by mouth daily.    . clindamycin (CLEOCIN) 300 MG capsule Take 300 mg by mouth every 6 (six) hours.    Marland Kitchen ELIQUIS 5 MG TABS tablet TAKE ONE TABLET BY MOUTH TWICE DAILY  180 tablet 1  . escitalopram (LEXAPRO) 20 MG tablet Take 20 mg by mouth at bedtime.     . fenofibrate 160 MG tablet TAKE ONE TABLET BY MOUTH DAILY AT BEDTIME  90 tablet 3  . furosemide (LASIX) 40 MG tablet Take 1 tablet (40 mg total) by mouth daily. 90 tablet 3  . hydrALAZINE (APRESOLINE) 10 MG tablet TAKE ONE TABLET BY MOUTH THREE TIMES DAILY  270 tablet 0  . HYDROcodone-acetaminophen (NORCO/VICODIN) 5-325 MG tablet Take 1 tablet by mouth every 4 (four) hours as needed for moderate pain.    Marland Kitchen ipratropium (ATROVENT) 0.03 % nasal spray INSTILL TWO SPRAYS INTO BOTH NOSTRILS AT BEDTIME 30 mL 0  . lamoTRIgine (LAMICTAL) 150 MG tablet Take 150 mg by mouth at bedtime. Reported on 08/14/2015    . montelukast (SINGULAIR) 10 MG tablet TAKE ONE TABLET BY MOUTH DAILY AT BEDTIME  90 tablet 3  . Multiple Vitamin (MULTIVITAMIN) tablet Take 1 tablet by mouth at bedtime.     . nitroGLYCERIN (NITROSTAT) 0.4  MG SL tablet Place 1 tablet (0.4 mg total) under the tongue every 5 (five) minutes as needed for chest pain. 90 tablet 3  . pantoprazole (PROTONIX) 40 MG tablet TAKE 1 TABLET BY MOUTH DAILY BEFORE BREAKFAST. (Patient taking differently: TAKE 1 TABLET BY MOUTH EVERY OTHER DAY BEFORE BREAKFAST) 90 tablet 3  . STIOLTO RESPIMAT 2.5-2.5 MCG/ACT AERS inhale 2 puffs by mouth into the lungs once daily 12 g 3  . triamcinolone cream (KENALOG) 0.1 % Apply 1 application topically 2 (two) times daily as needed (for skin).     . verapamil (CALAN-SR) 240 MG CR tablet TAKE ONE TABLET BY MOUTH DAILY AT BEDTIME  90 tablet 0   No current facility-administered medications on file prior to  visit.   Current Diagnosis/Assessment: Goals    . Pharmacy Care Plan     Current Barriers:  . Chronic Disease Management support, education, and care coordination needs related to hypertension, COPD, heart failure  Pharmacist Clinical Goal(s):  Marland Kitchen Maintain blood pressure within goal of less than 140/90 mmHg. Continue current medications. Check blood pressure daily for the next 7 days and call with readings.  . Continue iron three days per week. Update lab work in 4-6 weeks.   Interventions: . Comprehensive medication review performed. . Request iron panel and CBC.   Patient Self Care Activities:  . Patient verbalizes understanding of plan to return to clinic for blood pressure monitor evaluation, Self administers medications as prescribed, and Calls pharmacy for medication refills  Please see past updates related to this goal by clicking on the "Past Updates" button in the selected goal        Hypertension   BP today is: 122/64 mmHg (left arm, sitting, in office) Office blood pressures are  BP Readings from Last 3 Encounters:  09/06/19 (!) 160/67  08/31/19 (!) 145/66  08/06/19 (!) 162/56    CMP Latest Ref Rng & Units 03/19/2019 08/12/2018 08/07/2018  Glucose 70 - 99 mg/dL 100(H) 128(H) 104(H)  BUN 8 - 23 mg/dL 37(H) 26 35(H)  Creatinine 0.61 - 1.24 mg/dL 1.01 0.95 1.39(H)  Sodium 135 - 145 mmol/L 141 141 142  Potassium 3.5 - 5.1 mmol/L 3.9 3.7 4.0  Chloride 98 - 111 mmol/L 106 101 102  CO2 22 - 32 mmol/L 26 24 24   Calcium 8.9 - 10.3 mg/dL 9.9 9.4 10.0  Total Protein 6.4 - 8.3 g/dL - - -  Total Bilirubin 0.2 - 1.2 mg/dL - - -  Alkaline Phos 40 - 150 U/L - - -  AST 5 - 34 U/L - - -  ALT 0 - 55 U/L - - -   BP goal: <140/90 mmHg Patient has failed these meds in the past: none reported Home blood pressure: checked 2-3 times per week, did not record in log  Reports BP has been good: 120/70 mmHg when PT checked but no other readings provided  History: hydralazine started 2018  inpatient PRN due to concern for cough with COPD and ACE/ARB, hyperkalemia on Aldactone, beta blocker concern with COPD, concern for HCTZ with sulfa allergy (minimal evidence of allergic cross-reactivity between sulfonamide antimicrobials and non-antimicrobials)  Patient is currently uncontrolled on the following medications:   Hydralazine 10 mg - take one tablet three times daily  Verapamil 40 mg - take one tablet at bedtime (AFIB)  Lasix 40 mg - 1 tablet every morning  Plan: Continue current medications. Keep log of home readings for the next 7 days. Consider switching hydralazine to an ACE  or ARB.   GERD   History: GI bleed 2013, 2017 Patient has failed these meds in past: none Patient is currently controlled on the following medications:   Pantoprazole 40 mg - every other day   We discussed: interested in continued pantoprazole taper - current dose 40 mg QOD, denies reflux symptoms, however due to history of GI bleed, recommend continuation of therapy  Plan: Continue current medication  Pt reported: Iron Deficiency Anemia   CBC    Component Value Date/Time   WBC 7.1 03/19/2019 1605   RBC 3.60 (L) 03/19/2019 1605   HGB 10.5 (L) 03/19/2019 1605   HGB 10.2 (L) 07/01/2017 1218   HGB 11.4 (L) 09/08/2011 0956   HCT 33.0 (L) 03/19/2019 1605   HCT 35.4 (L) 09/08/2011 0956   PLT 190 03/19/2019 1605   PLT 175 07/01/2017 1218   PLT 203 09/08/2011 0956   MCV 91.7 03/19/2019 1605   MCV 85 09/08/2011 0956   MCH 29.2 03/19/2019 1605   MCHC 31.8 03/19/2019 1605   RDW 16.0 (H) 03/19/2019 1605   RDW 14.7 (H) 09/08/2011 0956   LYMPHSABS 1.4 03/24/2018 1631   MONOABS 0.6 03/24/2018 1631   EOSABS 0.3 03/24/2018 1631   BASOSABS 0.0 03/24/2018 1631   Iron/TIBC/Ferritin/ %Sat    Component Value Date/Time   IRON 70 08/17/2015 1558   TIBC 462 (H) 08/17/2015 1558   FERRITIN 26 08/17/2015 1558   IRONPCTSAT 15 (L) 08/17/2015 1558   Patient has failed these meds in past: none    Patient is currently controlled on the following medications:   Ferrous sulfate 325 mg (65 mg) - 1 tablet M,W,F   We discussed: denies constipation, taking iron three days a week, drinking 20 oz of water 4-5 times a day, has noticed some increased urination frequency (waking up once at night); taking iron with Clearlax on Monday, Wednesday, Friday. Reminded to alternate days with pantoprazole.   Plan: Continue current medications and repeat iron panel in 4-6 weeks.  CCM Follow Up: Nov 03, 2019 at 10:00 AM (telephone)  Debbora Dus, PharmD Clinical Pharmacist [facility] at [facility] [phone]

## 2019-09-25 NOTE — Telephone Encounter (Signed)
I left a detailed message on patient's voice mail to call back and schedule non-fasting lab in 2-3 weeks.

## 2019-09-25 NOTE — Telephone Encounter (Signed)
Received a call back from patient wife. They are headed out of town at the moment - they will call back when they return home to schedule the lab visit.

## 2019-09-30 DIAGNOSIS — Z9181 History of falling: Secondary | ICD-10-CM | POA: Diagnosis not present

## 2019-09-30 DIAGNOSIS — F39 Unspecified mood [affective] disorder: Secondary | ICD-10-CM | POA: Diagnosis not present

## 2019-09-30 DIAGNOSIS — I051 Rheumatic mitral insufficiency: Secondary | ICD-10-CM | POA: Diagnosis not present

## 2019-09-30 DIAGNOSIS — I2510 Atherosclerotic heart disease of native coronary artery without angina pectoris: Secondary | ICD-10-CM | POA: Diagnosis not present

## 2019-09-30 DIAGNOSIS — K219 Gastro-esophageal reflux disease without esophagitis: Secondary | ICD-10-CM | POA: Diagnosis not present

## 2019-09-30 DIAGNOSIS — I252 Old myocardial infarction: Secondary | ICD-10-CM | POA: Diagnosis not present

## 2019-09-30 DIAGNOSIS — I6523 Occlusion and stenosis of bilateral carotid arteries: Secondary | ICD-10-CM | POA: Diagnosis not present

## 2019-09-30 DIAGNOSIS — S41101D Unspecified open wound of right upper arm, subsequent encounter: Secondary | ICD-10-CM | POA: Diagnosis not present

## 2019-09-30 DIAGNOSIS — I110 Hypertensive heart disease with heart failure: Secondary | ICD-10-CM | POA: Diagnosis not present

## 2019-09-30 DIAGNOSIS — I482 Chronic atrial fibrillation, unspecified: Secondary | ICD-10-CM | POA: Diagnosis not present

## 2019-09-30 DIAGNOSIS — R0781 Pleurodynia: Secondary | ICD-10-CM | POA: Diagnosis not present

## 2019-09-30 DIAGNOSIS — M48061 Spinal stenosis, lumbar region without neurogenic claudication: Secondary | ICD-10-CM | POA: Diagnosis not present

## 2019-09-30 DIAGNOSIS — M159 Polyosteoarthritis, unspecified: Secondary | ICD-10-CM | POA: Diagnosis not present

## 2019-09-30 DIAGNOSIS — Z87891 Personal history of nicotine dependence: Secondary | ICD-10-CM | POA: Diagnosis not present

## 2019-09-30 DIAGNOSIS — J449 Chronic obstructive pulmonary disease, unspecified: Secondary | ICD-10-CM | POA: Diagnosis not present

## 2019-09-30 DIAGNOSIS — I5032 Chronic diastolic (congestive) heart failure: Secondary | ICD-10-CM | POA: Diagnosis not present

## 2019-09-30 DIAGNOSIS — Z7901 Long term (current) use of anticoagulants: Secondary | ICD-10-CM | POA: Diagnosis not present

## 2019-09-30 DIAGNOSIS — G8911 Acute pain due to trauma: Secondary | ICD-10-CM | POA: Diagnosis not present

## 2019-10-06 ENCOUNTER — Ambulatory Visit (INDEPENDENT_AMBULATORY_CARE_PROVIDER_SITE_OTHER): Payer: PPO | Admitting: Internal Medicine

## 2019-10-06 ENCOUNTER — Encounter: Payer: Self-pay | Admitting: Internal Medicine

## 2019-10-06 ENCOUNTER — Other Ambulatory Visit: Payer: Self-pay

## 2019-10-06 VITALS — BP 132/68 | HR 74 | Temp 98.0°F | Ht 62.0 in | Wt 140.0 lb

## 2019-10-06 DIAGNOSIS — R109 Unspecified abdominal pain: Secondary | ICD-10-CM

## 2019-10-06 DIAGNOSIS — R2689 Other abnormalities of gait and mobility: Secondary | ICD-10-CM

## 2019-10-06 NOTE — Assessment & Plan Note (Signed)
Seems to be related to back ?muscular Not clearly radicular Discussed lidocaine, tylenol

## 2019-10-06 NOTE — Progress Notes (Signed)
Subjective:    Patient ID: Eduardo [name], male    DOB: Sep 29, 1930, 86 y.o.   MRN: [id number]  HPI Here due to pain in his left side With wife as usual This visit occurred during the SARS-CoV-2 public health emergency.  Safety protocols were in place, including screening questions prior to the visit, additional usage of staff PPE, and extensive cleaning of exam room while observing appropriate contact time as indicated for disinfecting solutions.   Has had pain for 2 weeks Points to his LUQ Hadn't mentioned it before--thought it was part of his back Awoke with more pain and "in distress" Some better now Seems to change depending on his position--really worsened trying to get up Pain is sharp--variable intensity No rash there Will feel it with a big breath--but not really pleuritic Tried tylenol--not clearly helpful  Appetite is fine Not related to eating Moves bowels daily--not related to this (going 2-3 times daily with miralax)  Current Outpatient Medications on File Prior to Visit  Medication Sig Dispense Refill  . albuterol (PROVENTIL) (2.5 MG/3ML) 0.083% nebulizer solution Use 1 vial in nebulizer every 4 hours as needed for wheezing or shortness of breath 270 mL 2  . ALPRAZolam (XANAX) 0.25 MG tablet Take 1 tablet (0.25 mg total) by mouth 2 (two) times daily as needed for anxiety. 20 tablet 0  . atorvastatin (LIPITOR) 80 MG tablet Take 1 tablet (80 mg total) by mouth daily. 90 tablet 3  . B Complex-C (SUPER B COMPLEX PO) Take 1 tablet by mouth at bedtime.     . cetirizine (ZYRTEC) 10 MG tablet Take 10 mg by mouth at bedtime.     . cholecalciferol (VITAMIN D) 1000 units tablet Take 1,000 Units by mouth daily.    . Cholecalciferol (VITAMIN D3) 50 MCG (2000 UT) TABS Take 50 mcg by mouth daily.    . clindamycin (CLEOCIN) 300 MG capsule Take 300 mg by mouth every 6 (six) hours.    . Coenzyme Q10 ([employer] COQ-10 PO) Take by mouth.    Arne Cleveland 5 MG TABS tablet TAKE ONE TABLET BY  MOUTH TWICE DAILY  180 tablet 1  . escitalopram (LEXAPRO) 20 MG tablet Take 20 mg by mouth at bedtime.     . fenofibrate 160 MG tablet TAKE ONE TABLET BY MOUTH DAILY AT BEDTIME  90 tablet 3  . ferrous sulfate 324 MG TBEC Take 324 mg by mouth. Monday, Wednesday, Friday    . furosemide (LASIX) 40 MG tablet Take 1 tablet (40 mg total) by mouth daily. 90 tablet 3  . hydrALAZINE (APRESOLINE) 10 MG tablet TAKE ONE TABLET BY MOUTH THREE TIMES DAILY  270 tablet 0  . HYDROcodone-acetaminophen (NORCO/VICODIN) 5-325 MG tablet Take 1 tablet by mouth every 4 (four) hours as needed for moderate pain.    Marland Kitchen ipratropium (ATROVENT) 0.03 % nasal spray INSTILL TWO SPRAYS INTO BOTH NOSTRILS AT BEDTIME 30 mL 0  . lamoTRIgine (LAMICTAL) 150 MG tablet Take 150 mg by mouth at bedtime. Reported on 08/14/2015    . montelukast (SINGULAIR) 10 MG tablet TAKE ONE TABLET BY MOUTH DAILY AT BEDTIME  90 tablet 3  . Multiple Vitamin (MULTIVITAMIN) tablet Take 1 tablet by mouth at bedtime.     . pantoprazole (PROTONIX) 40 MG tablet TAKE 1 TABLET BY MOUTH DAILY BEFORE BREAKFAST. (Patient taking differently: TAKE 1 TABLET BY MOUTH EVERY OTHER DAY BEFORE BREAKFAST) 90 tablet 3  . polyethylene glycol (MIRALAX / GLYCOLAX) 17 g packet Take 17 g by  mouth daily.    Marland Kitchen STIOLTO RESPIMAT 2.5-2.5 MCG/ACT AERS inhale 2 puffs by mouth into the lungs once daily 12 g 3  . triamcinolone cream (KENALOG) 0.1 % Apply 1 application topically 2 (two) times daily as needed (for skin).     . verapamil (CALAN-SR) 240 MG CR tablet TAKE ONE TABLET BY MOUTH DAILY AT BEDTIME  90 tablet 0  . nitroGLYCERIN (NITROSTAT) 0.4 MG SL tablet Place 1 tablet (0.4 mg total) under the tongue every 5 (five) minutes as needed for chest pain. 90 tablet 3   No current facility-administered medications on file prior to visit.    Allergies  Allergen Reactions  . Penicillins Itching    Has patient had a PCN reaction causing immediate rash, facial/tongue/throat swelling, SOB or  lightheadedness with hypotension: Yes Has patient had a PCN reaction causing severe rash involving mucus membranes or skin necrosis: No Has patient had a PCN reaction that required hospitalization No Has patient had a PCN reaction occurring within the last 10 years: No If all of the above answers are "NO", then may proceed with Cephalosporin use.   . Sulfonamide Derivatives Other (See Comments)    "it affected my kidneys"  . Tape Other (See Comments)    Arms black and blue, tears skin.  Please use "paper" tape    Past Medical History:  Diagnosis Date  . Allergy   . Anemia 2012, 08/2015   chronic. Acute due to large hematoma 2013. 2017: due to gastric AVM bleeding.   . Anxiety   . CAD (coronary artery disease)    a. 06/2010 Cath: LM nl, LAD 50p/m, LCX 73m, RCA 50p/m -->catheter dissection but good flow, 70d, RPDA 50-70;  b. 06/2010 Relook cath due to bradycardia and inf ST elev: RCA dissection @ ostium extending into coronary cusp and prox RCA, Ao dissection-->less staining than previously-->Med Rx; c. 08/2018 MV: No ischemia.  . Chronic combined systolic (congestive) and diastolic (congestive) heart failure ([place])    a. 12/2014 Echo: EF 60-65%; b. 07/2016 Echo: EF 45-50%, mild LVH. Mild AS/MS. Mod-sev MR. Mild to mod TR. PASP 46mmHg; c. 08/2018 Echo: EF 55-60%, RVSP 68.41mmHg. Sev dil LA, mod dil RA. at least mod MR, mild to mod TR. Mild AS.  Marland Kitchen COPD (chronic obstructive pulmonary disease) ([place]) 2013   exertional dyspnea  . Gastric angiodysplasia with hemorrhage 08/2015  . Hx of colonic polyps 2001, 2011   adenomatous 2001. HP 2001, 2011.  Marland Kitchen Hyperlipidemia   . Mitral regurgitation    a. 07/2016 Echo: Mod to sev MR; b. 08/2018 Echo: at least moderate MR.  . Myocardial infarction ([place]) 06/2010.  12/2011.   "twice; 1 day apart; during/after cath". NQMI in setting severe anemia 2013.   Marland Kitchen Neuropathy 2014   in feet, likely due to spinal stenosis, DDD, HNP  . Osteoarthritis 2002   spinal stenosis,  spondylolisthesis, disc protrusion multilevel in lumbar spine  . Permanent atrial fibrillation ([place]) 2012   a.  CHA2DS2VASc = 6-->eliquis;  b. 12/2015 Echo: EF 60-65%, no rwma, LVH, mild AS/MS/MR, sev dil LA, mild TR, PASP 1mmHg.  . Stroke ([place]) 2016  . Upper GI bleed 08/2015   EGD: 2 small gastric AVMs, 1 actively bleeding.  Both treated with APC ablation, clipping of the bleeder    Past Surgical History:  Procedure Laterality Date  . APPENDECTOMY    . CARDIAC CATHETERIZATION  2012   50% LAD and luminal irreg in RCA, 1/12  Post cath MI from damage  .  CARPAL TUNNEL RELEASE     left  . CARPAL TUNNEL RELEASE  10/11/11   Dr Rush Barer  . CATARACT EXTRACTION W/ INTRAOCULAR LENS IMPLANT  2/13   Dr [name]  . COLONOSCOPY  2001, 02/2010   Dr Deatra Ina.   . ESOPHAGOGASTRODUODENOSCOPY N/A 08/15/2015   [name] MD; 2 small gastric AVMs, 1 actively bleeding.  Both treated with APC ablation, clipping of the bleeder  . FOOT FRACTURE SURGERY     right heel repair  . FRACTURE SURGERY    . KNEE ARTHROSCOPY     left, right knee 10/2009  . MASTOIDECTOMY     x3 on right  . NASAL SEPTUM SURGERY     nasal septal repair  . PENILE PROSTHESIS  REMOVAL    . PENILE PROSTHESIS IMPLANT     x 2--got infection after 2nd and had to remove  . PILONIDAL CYST / SINUS EXCISION    . SHOULDER ARTHROSCOPY     right  . TONSILLECTOMY AND ADENOIDECTOMY      Family History  Problem Relation Age of Onset  . Cancer Mother        ? leukemia  . Heart attack Father   . Stroke Maternal Grandmother   . Diabetes Neg Hx   . Hypertension Neg Hx     Social History   Socioeconomic History  . Marital status: Married    Spouse name: Not on file  . Number of children: 4  . Years of education: some college  . Highest education level: Not on file  Occupational History  . Occupation: Retired Chief Strategy Officer the job trainin)  Tobacco Use  . Smoking status: Former Smoker    Packs/day: 2.50    Years:  30.00    Pack years: 75.00    Types: Cigarettes    Quit date: 06/11/1978    Years since quitting: 41.3  . Smokeless tobacco: Never Used  Substance and Sexual Activity  . Alcohol use: No    Comment: QUIT [id number] alcoholic  . Drug use: No  . Sexual activity: Not Currently  Other Topics Concern  . Not on file  Social History Narrative   Grew up in [place], [place].  Lives in [place].   Married---2nd   2 children ([place], [place]  and near [place])--2 stepchildren   Former Smoker--quit in 1980   Alcohol use-no. Quit in 1980--alcoholic      Has living will.   Has designated wife, then step daughter [name] have health care POA.   Would accept resuscitation attempts.   No feeding tube if cognitively unaware.   Social Determinants of Health   Financial Resource Strain:   . Difficulty of Paying Living Expenses:   Food Insecurity:   . Worried About Charity fundraiser in the Last Year:   . Arboriculturist in the Last Year:   Transportation Needs:   . Film/video editor (Medical):   Marland Kitchen Lack of Transportation (Non-Medical):   Physical Activity:   . Days of Exercise per Week:   . Minutes of Exercise per Session:   Stress:   . Feeling of Stress :   Social Connections:   . Frequency of Communication with Friends and Family:   . Frequency of Social Gatherings with Friends and Family:   . Attends Religious Services:   . Active Member of Clubs or Organizations:   . Attends Archivist Meetings:   Marland Kitchen Marital Status:   Intimate Partner Violence:   .  Fear of Current or Ex-Partner:   . Emotionally Abused:   Marland Kitchen Physically Abused:   . Sexually Abused:    Review of Systems No N/V No cough or fever    Objective:   Physical Exam  Constitutional: He appears well-developed. No distress.  Neck: No thyromegaly present.  Cardiovascular: Normal rate. Exam reveals no gallop.  No murmur heard. irregular  Respiratory: Effort normal and breath sounds normal. No respiratory  distress. He has no wheezes. He has no rales.  GI: Soft. Bowel sounds are normal. He exhibits no distension. There is no abdominal tenderness. There is no rebound and no guarding.  Severe pain on left flank---points more LLQ though--when he went to lie down supine. Had to pull up legs. Nothing tender though  Musculoskeletal:        General: No edema.  Lymphadenopathy:    He has no cervical adenopathy.           Assessment & Plan:

## 2019-10-06 NOTE — Assessment & Plan Note (Signed)
Did improve some with home PT Will try to continue at [facility]

## 2019-10-13 ENCOUNTER — Other Ambulatory Visit: Payer: Self-pay

## 2019-10-13 ENCOUNTER — Other Ambulatory Visit (INDEPENDENT_AMBULATORY_CARE_PROVIDER_SITE_OTHER): Payer: PPO

## 2019-10-13 DIAGNOSIS — D509 Iron deficiency anemia, unspecified: Secondary | ICD-10-CM | POA: Diagnosis not present

## 2019-10-13 DIAGNOSIS — M6281 Muscle weakness (generalized): Secondary | ICD-10-CM | POA: Diagnosis not present

## 2019-10-13 DIAGNOSIS — R2681 Unsteadiness on feet: Secondary | ICD-10-CM | POA: Diagnosis not present

## 2019-10-13 LAB — CBC
HCT: 34.4 % — ABNORMAL LOW (ref 39.0–52.0)
Hemoglobin: 11.2 g/dL — ABNORMAL LOW (ref 13.0–17.0)
MCHC: 32.5 g/dL (ref 30.0–36.0)
MCV: 88 fl (ref 78.0–100.0)
Platelets: 252 10*3/uL (ref 150.0–400.0)
RBC: 3.91 Mil/uL — ABNORMAL LOW (ref 4.22–5.81)
RDW: 16.5 % — ABNORMAL HIGH (ref 11.5–15.5)
WBC: 8.9 10*3/uL (ref 4.0–10.5)

## 2019-10-15 ENCOUNTER — Other Ambulatory Visit: Payer: Self-pay | Admitting: Cardiovascular Disease

## 2019-10-15 DIAGNOSIS — M6281 Muscle weakness (generalized): Secondary | ICD-10-CM | POA: Diagnosis not present

## 2019-10-15 DIAGNOSIS — R2681 Unsteadiness on feet: Secondary | ICD-10-CM | POA: Diagnosis not present

## 2019-10-15 MED ORDER — PREDNISONE 20 MG PO TABS: 40.0000 mg | ORAL_TABLET | Freq: Every day | ORAL | 0 refills | Status: DC

## 2019-10-20 DIAGNOSIS — M6281 Muscle weakness (generalized): Secondary | ICD-10-CM | POA: Diagnosis not present

## 2019-10-20 DIAGNOSIS — R2681 Unsteadiness on feet: Secondary | ICD-10-CM | POA: Diagnosis not present

## 2019-10-21 ENCOUNTER — Other Ambulatory Visit: Payer: Self-pay | Admitting: Physical Medicine and Rehabilitation

## 2019-10-21 DIAGNOSIS — M5136 Other intervertebral disc degeneration, lumbar region: Secondary | ICD-10-CM | POA: Diagnosis not present

## 2019-10-21 DIAGNOSIS — M5416 Radiculopathy, lumbar region: Secondary | ICD-10-CM

## 2019-10-21 DIAGNOSIS — M5442 Lumbago with sciatica, left side: Secondary | ICD-10-CM | POA: Diagnosis not present

## 2019-10-21 DIAGNOSIS — M5441 Lumbago with sciatica, right side: Secondary | ICD-10-CM | POA: Diagnosis not present

## 2019-10-21 DIAGNOSIS — M48062 Spinal stenosis, lumbar region with neurogenic claudication: Secondary | ICD-10-CM | POA: Diagnosis not present

## 2019-10-22 DIAGNOSIS — R2681 Unsteadiness on feet: Secondary | ICD-10-CM | POA: Diagnosis not present

## 2019-10-22 DIAGNOSIS — M6281 Muscle weakness (generalized): Secondary | ICD-10-CM | POA: Diagnosis not present

## 2019-10-26 DIAGNOSIS — M6281 Muscle weakness (generalized): Secondary | ICD-10-CM | POA: Diagnosis not present

## 2019-10-26 DIAGNOSIS — R2681 Unsteadiness on feet: Secondary | ICD-10-CM | POA: Diagnosis not present

## 2019-10-28 ENCOUNTER — Telehealth: Payer: Self-pay

## 2019-10-28 NOTE — Telephone Encounter (Signed)
Please check on him tomorrow and see if he is feeing better

## 2019-10-28 NOTE — Telephone Encounter (Signed)
[facility] Night - Client Nonclinical Telephone Record AccessNurse Client [place] Night - Client Client Site [place] Physician [name] - MD Contact Type Call Who Is Calling Physician / Provider / Hospital Call Type Provider Call Message Only Reason for Call Request to send message to Office Initial Comment Caller states she is a NP doing a home visit and states he has been experiencing an upset stomach. Caller reports that it did resolve after drinking milk and pepto bismol. Caller reports he took his Prilosec, but that didn't resolve his issues. Caller states he is stopping the Clindamycin and will cancel dental procedure due to indigestion issues. Additional Comment Caller: Eduardo [name] (FNP) with [facility], [phone] Patient name: Eduardo [name], 13-Dec-1930 Disp. Time Disposition Final User 10/27/2019 5:14:53 PM General Information Provided Yes Pense, Lori Call Closed By: Gloris Ham Transaction Date/Time: 10/27/2019 5:11:27 PM (ET)

## 2019-10-29 DIAGNOSIS — M6281 Muscle weakness (generalized): Secondary | ICD-10-CM | POA: Diagnosis not present

## 2019-10-29 DIAGNOSIS — R2681 Unsteadiness on feet: Secondary | ICD-10-CM | POA: Diagnosis not present

## 2019-10-29 NOTE — Telephone Encounter (Signed)
Spoke to wife, [name]. They fixed the issue. It was the antibiotic and the Prilosec together. Once they separated the 2 he started feeling better.

## 2019-11-01 ENCOUNTER — Ambulatory Visit: Payer: PPO

## 2019-11-02 DIAGNOSIS — M6281 Muscle weakness (generalized): Secondary | ICD-10-CM | POA: Diagnosis not present

## 2019-11-02 DIAGNOSIS — R2681 Unsteadiness on feet: Secondary | ICD-10-CM | POA: Diagnosis not present

## 2019-11-03 ENCOUNTER — Telehealth: Payer: PPO

## 2019-11-05 ENCOUNTER — Other Ambulatory Visit: Payer: Self-pay | Admitting: Emergency Medicine

## 2019-11-06 DIAGNOSIS — M6281 Muscle weakness (generalized): Secondary | ICD-10-CM | POA: Diagnosis not present

## 2019-11-06 DIAGNOSIS — R2681 Unsteadiness on feet: Secondary | ICD-10-CM | POA: Diagnosis not present

## 2019-11-11 DIAGNOSIS — R2681 Unsteadiness on feet: Secondary | ICD-10-CM | POA: Diagnosis not present

## 2019-11-11 DIAGNOSIS — M6281 Muscle weakness (generalized): Secondary | ICD-10-CM | POA: Diagnosis not present

## 2019-11-12 DIAGNOSIS — M6281 Muscle weakness (generalized): Secondary | ICD-10-CM | POA: Diagnosis not present

## 2019-11-12 DIAGNOSIS — R2681 Unsteadiness on feet: Secondary | ICD-10-CM | POA: Diagnosis not present

## 2019-11-16 NOTE — Progress Notes (Signed)
Cardiology Office Note  Date:  11/17/2019   ID:  Eduardo [name], DOB 1931/03/29, MRN [id number]  PCP:  Venia Carbon, MD   Chief Complaint  Patient presents with  . office visit    Pt states SOB is getting worst/ fatigue/ swelling mostly in Right leg but in both (U). Meds verbally reviewed w/ pt.     HPI:  Eduardo [name] is a 86 y.o. 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 Sharlet Salina for acute on chronic low back with sciatica MRI has been ordered, he prefers to wait on MRI Having very mild symptoms  Reports sats of 88% at rest when he checks it at home, increases up to >90 with deep inspiration  EKG personally reviewed by myself on todays visit Shows atrial fibrillation ventricular rate 69 bpm intraventricular conduction delay  Other past medical history reviewed  echocardiogram in March 2020 showed normal LV function with an RVSP of 68.6 mmHg, severely dilated left  atrium, at least moderate mitral regurgitation Prior echocardiogram 2018 with right heart pressures of 50 Prior echocardiogram 2016 with right ventricular systolic pressure 34  underwent stress testing which was nonischemic, March 2020Other past medical history reviewed  Carotid ultrasound with less than 39% bilateral carotid disease  GI bleed/profound anemia in July 2013 with a fall at the time Hemoglobin at that time was around 6. Because of this and periodic falls, it was felt he was high risk for anticoagulation and Coumadin was held   PMH:   has a past medical history of Allergy, Anemia (2012, 08/2015), Anxiety, CAD (coronary artery disease), Chronic combined systolic (congestive) and diastolic (congestive) heart failure ([place]), COPD (chronic obstructive pulmonary disease) ([place]) (2013), Gastric angiodysplasia with hemorrhage (08/2015), colonic polyps (2001, 2011), Hyperlipidemia, Mitral regurgitation, Myocardial infarction [facility]) (06/2010.  12/2011.), Neuropathy (2014), Osteoarthritis (2002), Permanent atrial fibrillation ([place]) (2012), Stroke ([place]) (2016), and Upper GI bleed (08/2015).  PSH:    Past Surgical History:  Procedure Laterality Date  . APPENDECTOMY    . CARDIAC CATHETERIZATION  2012   50% LAD and luminal irreg in RCA, 1/12  Post cath MI from damage  . CARPAL TUNNEL RELEASE     left  . CARPAL TUNNEL RELEASE  10/11/11   Dr Rush Barer  . CATARACT EXTRACTION W/ INTRAOCULAR LENS IMPLANT  2/13   Dr [name]  . COLONOSCOPY  2001, 02/2010   Dr Deatra Ina.   . ESOPHAGOGASTRODUODENOSCOPY N/A 08/15/2015   [name] MD; 2 small gastric AVMs, 1 actively bleeding.  Both treated  with APC ablation, clipping of the bleeder  . FOOT FRACTURE SURGERY     right heel repair  . FRACTURE SURGERY    . KNEE ARTHROSCOPY     left, right knee 10/2009  . MASTOIDECTOMY     x3 on right  . NASAL SEPTUM SURGERY     nasal septal repair  . PENILE PROSTHESIS  REMOVAL    . PENILE PROSTHESIS IMPLANT      x 2--got infection after 2nd and had to remove  . PILONIDAL CYST / SINUS EXCISION    . SHOULDER ARTHROSCOPY     right  . TONSILLECTOMY AND ADENOIDECTOMY      Current Outpatient Medications  Medication Sig Dispense Refill  . albuterol (PROVENTIL) (2.5 MG/3ML) 0.083% nebulizer solution Use 1 vial in nebulizer every 4 hours as needed for wheezing or shortness of breath 270 mL 2  . atorvastatin (LIPITOR) 80 MG tablet Take 1 tablet (80 mg total) by mouth daily. 90 tablet 3  . B Complex-C (SUPER B COMPLEX PO) Take 1 tablet by mouth at bedtime.     . cetirizine (ZYRTEC) 10 MG tablet Take 10 mg by mouth at bedtime.     . cholecalciferol (VITAMIN D) 1000 units tablet Take 1,000 Units by mouth daily.    . Coenzyme Q10 ([employer] COQ-10 PO) Take by mouth.    Arne Cleveland 5 MG TABS tablet TAKE ONE TABLET BY MOUTH TWICE DAILY  180 tablet 1  . escitalopram (LEXAPRO) 20 MG tablet Take 20 mg by mouth at bedtime.     . fenofibrate 160 MG tablet TAKE ONE TABLET BY MOUTH DAILY AT BEDTIME  90 tablet 3  . ferrous sulfate 324 MG TBEC Take 324 mg by mouth. Monday, Wednesday, Friday    . furosemide (LASIX) 40 MG tablet Take 1 tablet (40 mg total) by mouth daily. 90 tablet 3  . hydrALAZINE (APRESOLINE) 10 MG tablet TAKE ONE TABLET BY MOUTH THREE TIMES DAILY  270 tablet 0  . ipratropium (ATROVENT) 0.03 % nasal spray INSTILL Two sprays into both nostrils at bedtime 30 mL 5  . lamoTRIgine (LAMICTAL) 150 MG tablet Take 150 mg by mouth at bedtime. Reported on 08/14/2015    . montelukast (SINGULAIR) 10 MG tablet TAKE ONE TABLET BY MOUTH DAILY AT BEDTIME  90 tablet 3  . Multiple Vitamin (MULTIVITAMIN) tablet Take 1 tablet by mouth at bedtime.     . pantoprazole (PROTONIX) 40 MG tablet TAKE 1 TABLET BY MOUTH DAILY BEFORE BREAKFAST. (Patient taking differently: TAKE 1 TABLET BY MOUTH EVERY OTHER DAY BEFORE BREAKFAST) 90 tablet 3  . STIOLTO RESPIMAT 2.5-2.5 MCG/ACT AERS inhale 2 puffs by mouth into the lungs once daily 12 g 3  .  triamcinolone cream (KENALOG) 0.1 % Apply 1 application topically 2 (two) times daily as needed (for skin).     . verapamil (CALAN-SR) 240 MG CR tablet TAKE ONE TABLET BY MOUTH DAILY AT BEDTIME  90 tablet 0  . ALPRAZolam (XANAX) 0.25 MG tablet Take 1 tablet (0.25 mg total) by mouth 2 (two) times daily as needed for anxiety. (Patient not taking: Reported on 11/17/2019) 20 tablet 0  . Cholecalciferol (VITAMIN D3) 50 MCG (2000 UT) TABS Take 50 mcg by mouth daily.    . clindamycin (CLEOCIN) 300 MG capsule Take 300 mg by mouth every 6 (six) hours.    Marland Kitchen HYDROcodone-acetaminophen (NORCO/VICODIN) 5-325 MG tablet Take 1 tablet by mouth every 4 (four) hours as needed for moderate pain.    Marland Kitchen  nitroGLYCERIN (NITROSTAT) 0.4 MG SL tablet Place 1 tablet (0.4 mg total) under the tongue every 5 (five) minutes as needed for chest pain. (Patient not taking: Reported on 11/17/2019) 90 tablet 3  . polyethylene glycol (MIRALAX / GLYCOLAX) 17 g packet Take 17 g by mouth daily.    . predniSONE (DELTASONE) 20 MG tablet Take 2 tablets (40 mg total) by mouth daily. For 3 days, then 1 tab daily for 3 days. (Patient not taking: Reported on 11/17/2019) 9 tablet 0   No current facility-administered medications for this visit.     Allergies:   Penicillins, Sulfonamide derivatives, and Tape   Social History:  The patient  reports that he quit smoking about 41 years ago. His smoking use included cigarettes. He has a 75.00 pack-year smoking history. He has never used smokeless tobacco. He reports that he does not drink alcohol or use drugs.   Family History:   family history includes Cancer in his mother; Heart attack in his father; Stroke in his maternal grandmother.   Review of Systems: Review of Systems  Constitutional: Negative.   Respiratory: Positive for shortness of breath.   Cardiovascular: Negative.   Gastrointestinal: Negative.   Musculoskeletal: Positive for back pain.  Neurological: Negative.   Psychiatric/Behavioral:  Negative.   All other systems reviewed and are negative.   PHYSICAL EXAM: VS:  BP (!) 146/62 (BP Location: Left Arm, Patient Position: Sitting, Cuff Size: Normal)   Pulse 69   Ht 5\' 2"  (1.575 m)   Wt 144 lb 6 oz (65.5 kg)   SpO2 96%   BMI 26.41 kg/m  , BMI Body mass index is 26.41 kg/m. Constitutional:  oriented to person, place, and time. No distress.  HENT:  Head: Grossly normal Eyes:  no discharge. No scleral icterus.  Neck: No JVD, no carotid bruits  Cardiovascular: Regular rate and rhythm, no murmurs appreciated Pulmonary/Chest: Clear to auscultation bilaterally, no wheezes or rails Abdominal: Soft.  no distension.  no tenderness.  Musculoskeletal: Normal range of motion Neurological:  normal muscle tone. Coordination normal. No atrophy Skin: Skin warm and dry Psychiatric: normal affect, pleasant   Recent Labs: 03/19/2019: BUN 37; Creatinine, Ser 1.01; Potassium 3.9; Sodium 141 10/13/2019: Hemoglobin 11.2; Platelets 252.0    Lipid Panel Lab Results  Component Value Date   CHOL 115 08/11/2019   HDL 39.30 08/11/2019   LDLCALC UNABLE TO CALCULATE IF TRIGLYCERIDE OVER 400 mg/dL 12/31/2014   TRIG (H) 08/11/2019    1142.0 Triglyceride is over 400; calculations on Lipids are invalid.      Wt Readings from Last 3 Encounters:  11/17/19 144 lb 6 oz (65.5 kg)  10/06/19 140 lb (63.5 kg)  09/06/19 135 lb (61.2 kg)      ASSESSMENT AND PLAN:  Chronic atrial fibrillation ([place]) - Plan: EKG 12-Lead rate relatively well-controlled On verapamil, and Eliquis Previous falls with wounds to his forearms which have now healed Long discussion with patient and patient's wife concerning appropriate dosing Reports low weight at home 135 pounds but here it is consistently 140 or higher Recommended he stay on Eliquis 5 twice daily  Pulmonary hypertension  climbing pressures from 34 up to 50 now 68 on echocardiogram Seen by advanced heart failure clinic Has not had a chance to  schedule follow-up And concerned about his weight gain, possible indication of fluid retention He does have mild lower extremity pitting edema Recommend he had extra Lasix for this edema, moderate his fluid intake  Coronary artery disease of native  artery of native heart with stable angina pectoris (HCC) -  Negative stress test earlier 2020 Chronic shortness of breath, denies anginal symptoms Recommend extra Lasix as above No further ischemic work-up at this time  Chronic diastolic HF (heart failure) (HCC) Lasix 40 daily, stable renal function  exacerbated by atrial fibrillation, MR Slight worsening of his edema with weight gain on today's visit Extra Lasix as above  Bilateral carotid artery stenosis Continue aggressive cholesterol management Cholesterol is at goal on the current lipid regimen. No changes to the medications were made.  Pulmonary emphysema, unspecified emphysema type ([place]) Followed by pulmonary Reports hypoxia, saturations high 80s improving into the 90s with improved respirations Recommend he follow-up with pulmonary  Disposition:   F/U  6 months  Long discussion concerning risk and benefit of anticoagulation, discussed management of fluids, hypoxia  Total encounter time more than 45 minutes  Greater than 50% was spent in counseling and coordination of care with the patient    Orders Placed This Encounter  Procedures  . EKG 12-Lead     Signed, Esmond Plants, M.D., Ph.D. 11/17/2019  [place], [place]

## 2019-11-17 ENCOUNTER — Encounter: Payer: Self-pay | Admitting: Cardiovascular Disease

## 2019-11-17 ENCOUNTER — Other Ambulatory Visit: Payer: Self-pay

## 2019-11-17 ENCOUNTER — Ambulatory Visit: Payer: PPO | Admitting: Cardiovascular Disease

## 2019-11-17 VITALS — BP 146/62 | HR 69 | Ht 62.0 in | Wt 144.4 lb

## 2019-11-17 DIAGNOSIS — I4821 Permanent atrial fibrillation: Secondary | ICD-10-CM

## 2019-11-17 DIAGNOSIS — I5032 Chronic diastolic (congestive) heart failure: Secondary | ICD-10-CM

## 2019-11-17 DIAGNOSIS — E785 Hyperlipidemia, unspecified: Secondary | ICD-10-CM

## 2019-11-17 DIAGNOSIS — I10 Essential (primary) hypertension: Secondary | ICD-10-CM

## 2019-11-17 DIAGNOSIS — I25119 Atherosclerotic heart disease of native coronary artery with unspecified angina pectoris: Secondary | ICD-10-CM

## 2019-11-17 DIAGNOSIS — I2721 Secondary pulmonary arterial hypertension: Secondary | ICD-10-CM | POA: Diagnosis not present

## 2019-11-17 DIAGNOSIS — I340 Nonrheumatic mitral (valve) insufficiency: Secondary | ICD-10-CM | POA: Diagnosis not present

## 2019-11-17 NOTE — Patient Instructions (Signed)
BMP today  Medication Instructions:  No changes  If you need a refill on your cardiac medications before your next appointment, please call your pharmacy.    Lab work: No new labs needed   If you have labs (blood work) drawn today and your tests are completely normal, you will receive your results only by: Marland Kitchen MyChart Message (if you have MyChart) OR . A paper copy in the mail If you have any lab test that is abnormal or we need to change your treatment, we will call you to review the results.   Testing/Procedures: No new testing needed   Follow-Up: At [facility], you and your health needs are our priority.  As part of our continuing mission to provide you with exceptional heart care, we have created designated Provider Care Teams.  These Care Teams include your primary Cardiologist (physician) and Advanced Practice Providers (APPs -  Physician Assistants and Nurse Practitioners) who all work together to provide you with the care you need, when you need it.  . You will need a follow up appointment in 6 months   . Providers on your designated Care Team:   . [name], NP . Christell Faith, PA-C . Marrianne Mood, PA-C  Any Other Special Instructions Will Be Listed Below (If Applicable).  For educational health videos Log in to : [email or web address] Or : [email or web address], password : [email or web address]

## 2019-11-18 LAB — BASIC METABOLIC PANEL
BUN/Creatinine Ratio: 32 — ABNORMAL HIGH (ref 10–24)
BUN: 35 mg/dL — ABNORMAL HIGH (ref 8–27)
CO2: 23 mmol/L (ref 20–29)
Calcium: 9.8 mg/dL (ref 8.6–10.2)
Chloride: 107 mmol/L — ABNORMAL HIGH (ref 96–106)
Creatinine, Ser: 1.1 mg/dL (ref 0.76–1.27)
GFR calc Af Amer: 70 mL/min/{1.73_m2} (ref >59)
GFR calc non Af Amer: 60 mL/min/{1.73_m2} (ref >59)
Glucose: 93 mg/dL (ref 65–99)
Potassium: 4.2 mmol/L (ref 3.5–5.2)
Sodium: 147 mmol/L — ABNORMAL HIGH (ref 134–144)

## 2019-11-21 DIAGNOSIS — I5032 Chronic diastolic (congestive) heart failure: Secondary | ICD-10-CM

## 2019-11-23 NOTE — Telephone Encounter (Signed)
Dr. Byrum, please see pt's mychart message and advise. 

## 2019-11-23 NOTE — Telephone Encounter (Signed)
Yes, his observation is correct. That altitude can affect his O2 numbers. It is Ok for him to up titrate based on his SpO2. Goal > 88%

## 2019-11-25 DIAGNOSIS — M6281 Muscle weakness (generalized): Secondary | ICD-10-CM | POA: Diagnosis not present

## 2019-11-25 DIAGNOSIS — R2681 Unsteadiness on feet: Secondary | ICD-10-CM | POA: Diagnosis not present

## 2019-11-26 ENCOUNTER — Other Ambulatory Visit: Payer: Self-pay | Admitting: Cardiovascular Disease

## 2019-11-26 ENCOUNTER — Other Ambulatory Visit: Payer: Self-pay | Admitting: Internal Medicine

## 2019-11-27 DIAGNOSIS — R2681 Unsteadiness on feet: Secondary | ICD-10-CM | POA: Diagnosis not present

## 2019-11-27 DIAGNOSIS — M6281 Muscle weakness (generalized): Secondary | ICD-10-CM | POA: Diagnosis not present

## 2019-12-01 DIAGNOSIS — M6281 Muscle weakness (generalized): Secondary | ICD-10-CM | POA: Diagnosis not present

## 2019-12-01 DIAGNOSIS — R2681 Unsteadiness on feet: Secondary | ICD-10-CM | POA: Diagnosis not present

## 2019-12-04 DIAGNOSIS — R2681 Unsteadiness on feet: Secondary | ICD-10-CM | POA: Diagnosis not present

## 2019-12-04 DIAGNOSIS — M6281 Muscle weakness (generalized): Secondary | ICD-10-CM | POA: Diagnosis not present

## 2019-12-08 DIAGNOSIS — R2681 Unsteadiness on feet: Secondary | ICD-10-CM | POA: Diagnosis not present

## 2019-12-08 DIAGNOSIS — M6281 Muscle weakness (generalized): Secondary | ICD-10-CM | POA: Diagnosis not present

## 2019-12-10 DIAGNOSIS — R2681 Unsteadiness on feet: Secondary | ICD-10-CM | POA: Diagnosis not present

## 2019-12-10 DIAGNOSIS — M6281 Muscle weakness (generalized): Secondary | ICD-10-CM | POA: Diagnosis not present

## 2019-12-15 DIAGNOSIS — M6281 Muscle weakness (generalized): Secondary | ICD-10-CM | POA: Diagnosis not present

## 2019-12-15 DIAGNOSIS — R2681 Unsteadiness on feet: Secondary | ICD-10-CM | POA: Diagnosis not present

## 2019-12-16 DIAGNOSIS — R2681 Unsteadiness on feet: Secondary | ICD-10-CM | POA: Diagnosis not present

## 2019-12-16 DIAGNOSIS — M6281 Muscle weakness (generalized): Secondary | ICD-10-CM | POA: Diagnosis not present

## 2019-12-18 ENCOUNTER — Other Ambulatory Visit: Payer: Self-pay

## 2019-12-18 ENCOUNTER — Other Ambulatory Visit
Admission: RE | Admit: 2019-12-18 | Discharge: 2019-12-18 | Disposition: A | Discharge status: Home / Self Care | Payer: PPO | Attending: Cardiovascular Disease | Admitting: Cardiovascular Disease

## 2019-12-18 DIAGNOSIS — I5032 Chronic diastolic (congestive) heart failure: Secondary | ICD-10-CM | POA: Diagnosis not present

## 2019-12-18 LAB — HEPATIC FUNCTION PANEL
ALT: 19 U/L (ref 0–44)
AST: 33 U/L (ref 15–41)
Albumin: 3.8 g/dL (ref 3.5–5.0)
Alkaline Phosphatase: 57 U/L (ref 38–126)
Bilirubin, Direct: 0.1 mg/dL (ref 0.0–0.2)
Indirect Bilirubin: 0.6 mg/dL (ref 0.3–0.9)
Total Bilirubin: 0.7 mg/dL (ref 0.3–1.2)
Total Protein: 7.4 g/dL (ref 6.5–8.1)

## 2019-12-22 DIAGNOSIS — F39 Unspecified mood [affective] disorder: Secondary | ICD-10-CM | POA: Diagnosis not present

## 2019-12-22 DIAGNOSIS — F411 Generalized anxiety disorder: Secondary | ICD-10-CM | POA: Diagnosis not present

## 2019-12-23 DIAGNOSIS — M6281 Muscle weakness (generalized): Secondary | ICD-10-CM | POA: Diagnosis not present

## 2019-12-23 DIAGNOSIS — R2681 Unsteadiness on feet: Secondary | ICD-10-CM | POA: Diagnosis not present

## 2019-12-25 DIAGNOSIS — R2681 Unsteadiness on feet: Secondary | ICD-10-CM | POA: Diagnosis not present

## 2019-12-25 DIAGNOSIS — M6281 Muscle weakness (generalized): Secondary | ICD-10-CM | POA: Diagnosis not present

## 2019-12-28 DIAGNOSIS — R2681 Unsteadiness on feet: Secondary | ICD-10-CM | POA: Diagnosis not present

## 2019-12-28 DIAGNOSIS — M6281 Muscle weakness (generalized): Secondary | ICD-10-CM | POA: Diagnosis not present

## 2019-12-31 DIAGNOSIS — R2681 Unsteadiness on feet: Secondary | ICD-10-CM | POA: Diagnosis not present

## 2019-12-31 DIAGNOSIS — M6281 Muscle weakness (generalized): Secondary | ICD-10-CM | POA: Diagnosis not present

## 2020-01-04 DIAGNOSIS — R2681 Unsteadiness on feet: Secondary | ICD-10-CM | POA: Diagnosis not present

## 2020-01-04 DIAGNOSIS — M6281 Muscle weakness (generalized): Secondary | ICD-10-CM | POA: Diagnosis not present

## 2020-01-11 DIAGNOSIS — R2681 Unsteadiness on feet: Secondary | ICD-10-CM | POA: Diagnosis not present

## 2020-01-11 DIAGNOSIS — M6281 Muscle weakness (generalized): Secondary | ICD-10-CM | POA: Diagnosis not present

## 2020-01-13 DIAGNOSIS — M6281 Muscle weakness (generalized): Secondary | ICD-10-CM | POA: Diagnosis not present

## 2020-01-13 DIAGNOSIS — R2681 Unsteadiness on feet: Secondary | ICD-10-CM | POA: Diagnosis not present

## 2020-01-18 DIAGNOSIS — R2681 Unsteadiness on feet: Secondary | ICD-10-CM | POA: Diagnosis not present

## 2020-01-18 DIAGNOSIS — M6281 Muscle weakness (generalized): Secondary | ICD-10-CM | POA: Diagnosis not present

## 2020-01-20 DIAGNOSIS — R2681 Unsteadiness on feet: Secondary | ICD-10-CM | POA: Diagnosis not present

## 2020-01-20 DIAGNOSIS — M6281 Muscle weakness (generalized): Secondary | ICD-10-CM | POA: Diagnosis not present

## 2020-01-25 DIAGNOSIS — R2681 Unsteadiness on feet: Secondary | ICD-10-CM | POA: Diagnosis not present

## 2020-01-25 DIAGNOSIS — M6281 Muscle weakness (generalized): Secondary | ICD-10-CM | POA: Diagnosis not present

## 2020-01-27 DIAGNOSIS — R2681 Unsteadiness on feet: Secondary | ICD-10-CM | POA: Diagnosis not present

## 2020-01-27 DIAGNOSIS — M6281 Muscle weakness (generalized): Secondary | ICD-10-CM | POA: Diagnosis not present

## 2020-02-01 DIAGNOSIS — R2681 Unsteadiness on feet: Secondary | ICD-10-CM | POA: Diagnosis not present

## 2020-02-01 DIAGNOSIS — M6281 Muscle weakness (generalized): Secondary | ICD-10-CM | POA: Diagnosis not present

## 2020-02-03 DIAGNOSIS — M6281 Muscle weakness (generalized): Secondary | ICD-10-CM | POA: Diagnosis not present

## 2020-02-03 DIAGNOSIS — R2681 Unsteadiness on feet: Secondary | ICD-10-CM | POA: Diagnosis not present

## 2020-02-05 ENCOUNTER — Other Ambulatory Visit: Payer: Self-pay | Admitting: Internal Medicine

## 2020-02-07 ENCOUNTER — Inpatient Hospital Stay (HOSPITAL_COMMUNITY)
Admission: EM | Admit: 2020-02-07 | Discharge: 2020-02-18 | DRG: 280 | Disposition: A | Discharge status: Skilled Nursing Facility | Payer: PPO | Attending: Cardiovascular Disease | Admitting: Cardiovascular Disease

## 2020-02-07 ENCOUNTER — Inpatient Hospital Stay (HOSPITAL_COMMUNITY): Payer: PPO

## 2020-02-07 ENCOUNTER — Emergency Department (HOSPITAL_COMMUNITY): Payer: PPO

## 2020-02-07 ENCOUNTER — Other Ambulatory Visit: Payer: Self-pay

## 2020-02-07 ENCOUNTER — Encounter (HOSPITAL_COMMUNITY): Payer: Self-pay | Admitting: *Deleted

## 2020-02-07 DIAGNOSIS — Z8673 Personal history of transient ischemic attack (TIA), and cerebral infarction without residual deficits: Secondary | ICD-10-CM | POA: Diagnosis not present

## 2020-02-07 DIAGNOSIS — Z86010 Personal history of colonic polyps: Secondary | ICD-10-CM | POA: Diagnosis not present

## 2020-02-07 DIAGNOSIS — E785 Hyperlipidemia, unspecified: Secondary | ICD-10-CM | POA: Diagnosis not present

## 2020-02-07 DIAGNOSIS — I4821 Permanent atrial fibrillation: Secondary | ICD-10-CM | POA: Diagnosis not present

## 2020-02-07 DIAGNOSIS — E781 Pure hyperglyceridemia: Secondary | ICD-10-CM | POA: Diagnosis present

## 2020-02-07 DIAGNOSIS — Z7901 Long term (current) use of anticoagulants: Secondary | ICD-10-CM

## 2020-02-07 DIAGNOSIS — I517 Cardiomegaly: Secondary | ICD-10-CM | POA: Diagnosis not present

## 2020-02-07 DIAGNOSIS — R0602 Shortness of breath: Secondary | ICD-10-CM | POA: Diagnosis not present

## 2020-02-07 DIAGNOSIS — Z8249 Family history of ischemic heart disease and other diseases of the circulatory system: Secondary | ICD-10-CM

## 2020-02-07 DIAGNOSIS — N170 Acute kidney failure with tubular necrosis: Secondary | ICD-10-CM | POA: Diagnosis not present

## 2020-02-07 DIAGNOSIS — I252 Old myocardial infarction: Secondary | ICD-10-CM | POA: Diagnosis not present

## 2020-02-07 DIAGNOSIS — I110 Hypertensive heart disease with heart failure: Secondary | ICD-10-CM | POA: Diagnosis present

## 2020-02-07 DIAGNOSIS — I340 Nonrheumatic mitral (valve) insufficiency: Secondary | ICD-10-CM | POA: Diagnosis not present

## 2020-02-07 DIAGNOSIS — M2550 Pain in unspecified joint: Secondary | ICD-10-CM | POA: Diagnosis not present

## 2020-02-07 DIAGNOSIS — R0789 Other chest pain: Secondary | ICD-10-CM | POA: Diagnosis not present

## 2020-02-07 DIAGNOSIS — I447 Left bundle-branch block, unspecified: Secondary | ICD-10-CM | POA: Diagnosis not present

## 2020-02-07 DIAGNOSIS — Z9049 Acquired absence of other specified parts of digestive tract: Secondary | ICD-10-CM | POA: Diagnosis not present

## 2020-02-07 DIAGNOSIS — I255 Ischemic cardiomyopathy: Secondary | ICD-10-CM | POA: Diagnosis present

## 2020-02-07 DIAGNOSIS — I4811 Longstanding persistent atrial fibrillation: Secondary | ICD-10-CM | POA: Diagnosis not present

## 2020-02-07 DIAGNOSIS — I959 Hypotension, unspecified: Secondary | ICD-10-CM | POA: Diagnosis not present

## 2020-02-07 DIAGNOSIS — Z882 Allergy status to sulfonamides status: Secondary | ICD-10-CM

## 2020-02-07 DIAGNOSIS — I361 Nonrheumatic tricuspid (valve) insufficiency: Secondary | ICD-10-CM

## 2020-02-07 DIAGNOSIS — Z961 Presence of intraocular lens: Secondary | ICD-10-CM | POA: Diagnosis not present

## 2020-02-07 DIAGNOSIS — J449 Chronic obstructive pulmonary disease, unspecified: Secondary | ICD-10-CM | POA: Diagnosis not present

## 2020-02-07 DIAGNOSIS — R262 Difficulty in walking, not elsewhere classified: Secondary | ICD-10-CM | POA: Diagnosis not present

## 2020-02-07 DIAGNOSIS — F419 Anxiety disorder, unspecified: Secondary | ICD-10-CM | POA: Diagnosis not present

## 2020-02-07 DIAGNOSIS — I4891 Unspecified atrial fibrillation: Secondary | ICD-10-CM

## 2020-02-07 DIAGNOSIS — N179 Acute kidney failure, unspecified: Secondary | ICD-10-CM | POA: Diagnosis not present

## 2020-02-07 DIAGNOSIS — I130 Hypertensive heart and chronic kidney disease with heart failure and stage 1 through stage 4 chronic kidney disease, or unspecified chronic kidney disease: Secondary | ICD-10-CM | POA: Diagnosis not present

## 2020-02-07 DIAGNOSIS — D638 Anemia in other chronic diseases classified elsewhere: Secondary | ICD-10-CM | POA: Diagnosis present

## 2020-02-07 DIAGNOSIS — D649 Anemia, unspecified: Secondary | ICD-10-CM

## 2020-02-07 DIAGNOSIS — I214 Non-ST elevation (NSTEMI) myocardial infarction: Secondary | ICD-10-CM

## 2020-02-07 DIAGNOSIS — I5042 Chronic combined systolic (congestive) and diastolic (congestive) heart failure: Secondary | ICD-10-CM | POA: Diagnosis present

## 2020-02-07 DIAGNOSIS — I25119 Atherosclerotic heart disease of native coronary artery with unspecified angina pectoris: Secondary | ICD-10-CM | POA: Diagnosis not present

## 2020-02-07 DIAGNOSIS — I5043 Acute on chronic combined systolic (congestive) and diastolic (congestive) heart failure: Secondary | ICD-10-CM | POA: Diagnosis present

## 2020-02-07 DIAGNOSIS — J441 Chronic obstructive pulmonary disease with (acute) exacerbation: Secondary | ICD-10-CM | POA: Diagnosis not present

## 2020-02-07 DIAGNOSIS — K219 Gastro-esophageal reflux disease without esophagitis: Secondary | ICD-10-CM | POA: Diagnosis not present

## 2020-02-07 DIAGNOSIS — R2681 Unsteadiness on feet: Secondary | ICD-10-CM | POA: Diagnosis not present

## 2020-02-07 DIAGNOSIS — R188 Other ascites: Secondary | ICD-10-CM | POA: Diagnosis present

## 2020-02-07 DIAGNOSIS — J439 Emphysema, unspecified: Secondary | ICD-10-CM | POA: Diagnosis present

## 2020-02-07 DIAGNOSIS — R278 Other lack of coordination: Secondary | ICD-10-CM | POA: Diagnosis not present

## 2020-02-07 DIAGNOSIS — M6281 Muscle weakness (generalized): Secondary | ICD-10-CM | POA: Diagnosis not present

## 2020-02-07 DIAGNOSIS — I5032 Chronic diastolic (congestive) heart failure: Secondary | ICD-10-CM

## 2020-02-07 DIAGNOSIS — Z91048 Other nonmedicinal substance allergy status: Secondary | ICD-10-CM

## 2020-02-07 DIAGNOSIS — I5023 Acute on chronic systolic (congestive) heart failure: Secondary | ICD-10-CM | POA: Diagnosis not present

## 2020-02-07 DIAGNOSIS — Z20822 Contact with and (suspected) exposure to covid-19: Secondary | ICD-10-CM | POA: Diagnosis not present

## 2020-02-07 DIAGNOSIS — I4819 Other persistent atrial fibrillation: Secondary | ICD-10-CM | POA: Diagnosis not present

## 2020-02-07 DIAGNOSIS — Z741 Need for assistance with personal care: Secondary | ICD-10-CM | POA: Diagnosis not present

## 2020-02-07 DIAGNOSIS — R0902 Hypoxemia: Secondary | ICD-10-CM

## 2020-02-07 DIAGNOSIS — Z823 Family history of stroke: Secondary | ICD-10-CM

## 2020-02-07 DIAGNOSIS — I2510 Atherosclerotic heart disease of native coronary artery without angina pectoris: Secondary | ICD-10-CM | POA: Diagnosis present

## 2020-02-07 DIAGNOSIS — Z7401 Bed confinement status: Secondary | ICD-10-CM | POA: Diagnosis not present

## 2020-02-07 DIAGNOSIS — Z87891 Personal history of nicotine dependence: Secondary | ICD-10-CM

## 2020-02-07 DIAGNOSIS — N281 Cyst of kidney, acquired: Secondary | ICD-10-CM | POA: Diagnosis not present

## 2020-02-07 DIAGNOSIS — Z880 Allergy status to penicillin: Secondary | ICD-10-CM

## 2020-02-07 DIAGNOSIS — Z9841 Cataract extraction status, right eye: Secondary | ICD-10-CM

## 2020-02-07 DIAGNOSIS — R4189 Other symptoms and signs involving cognitive functions and awareness: Secondary | ICD-10-CM | POA: Diagnosis not present

## 2020-02-07 DIAGNOSIS — Z79899 Other long term (current) drug therapy: Secondary | ICD-10-CM

## 2020-02-07 DIAGNOSIS — R079 Chest pain, unspecified: Secondary | ICD-10-CM | POA: Diagnosis not present

## 2020-02-07 LAB — HEPARIN LEVEL (UNFRACTIONATED): Heparin Unfractionated: 0.27 IU/mL — ABNORMAL LOW (ref 0.30–0.70)

## 2020-02-07 LAB — ECHOCARDIOGRAM COMPLETE
Area-P 1/2: 4.57 cm2
Calc EF: 39.8 %
Height: 62 in
S' Lateral: 3.7 cm
Single Plane A2C EF: 32.7 %
Single Plane A4C EF: 46.1 %
Weight: 2222.24 oz

## 2020-02-07 LAB — BASIC METABOLIC PANEL
Anion gap: 11 (ref 5–15)
BUN: 37 mg/dL — ABNORMAL HIGH (ref 8–23)
CO2: 26 mmol/L (ref 22–32)
Calcium: 9.9 mg/dL (ref 8.9–10.3)
Chloride: 103 mmol/L (ref 98–111)
Creatinine, Ser: 1.07 mg/dL (ref 0.61–1.24)
GFR calc Af Amer: >60 mL/min (ref >60)
GFR calc non Af Amer: >60 mL/min (ref >60)
Glucose, Bld: 124 mg/dL — ABNORMAL HIGH (ref 70–99)
Potassium: 3.7 mmol/L (ref 3.5–5.1)
Sodium: 140 mmol/L (ref 135–145)

## 2020-02-07 LAB — TROPONIN I (HIGH SENSITIVITY)
Troponin I (High Sensitivity): 33 ng/L — ABNORMAL HIGH (ref <18)
Troponin I (High Sensitivity): 414 ng/L (ref <18)
Troponin I (High Sensitivity): 21943 ng/L (ref <18)
Troponin I (High Sensitivity): >27000 ng/L (ref <18)

## 2020-02-07 LAB — CBC
HCT: 32.8 % — ABNORMAL LOW (ref 39.0–52.0)
Hemoglobin: 10.5 g/dL — ABNORMAL LOW (ref 13.0–17.0)
MCH: 29.7 pg (ref 26.0–34.0)
MCHC: 32 g/dL (ref 30.0–36.0)
MCV: 92.7 fL (ref 80.0–100.0)
Platelets: 235 10*3/uL (ref 150–400)
RBC: 3.54 MIL/uL — ABNORMAL LOW (ref 4.22–5.81)
RDW: 16 % — ABNORMAL HIGH (ref 11.5–15.5)
WBC: 7.7 10*3/uL (ref 4.0–10.5)
nRBC: 0 % (ref 0.0–0.2)

## 2020-02-07 LAB — SARS CORONAVIRUS 2 BY RT PCR (HOSPITAL ORDER, PERFORMED IN ~~LOC~~ HOSPITAL LAB): SARS Coronavirus 2: NEGATIVE

## 2020-02-07 LAB — APTT: aPTT: 63 seconds — ABNORMAL HIGH (ref 24–36)

## 2020-02-07 LAB — BRAIN NATRIURETIC PEPTIDE: B Natriuretic Peptide: 802.2 pg/mL — ABNORMAL HIGH (ref 0.0–100.0)

## 2020-02-07 MED ORDER — HYDRALAZINE HCL 10 MG PO TABS
10.0000 mg | ORAL_TABLET | Freq: Three times a day (TID) | ORAL | Status: DC
  Administered 2020-02-07 – 2020-02-18 (×32): 10 mg via ORAL
  Filled 2020-02-07 (×34): qty 1

## 2020-02-07 MED ORDER — ASPIRIN 81 MG PO CHEW: 324.0000 mg | CHEWABLE_TABLET | ORAL | Status: DC

## 2020-02-07 MED ORDER — ASPIRIN 81 MG PO CHEW
81.0000 mg | CHEWABLE_TABLET | ORAL | Status: AC
  Administered 2020-02-08: 81 mg via ORAL
  Filled 2020-02-07: qty 1

## 2020-02-07 MED ORDER — ADULT MULTIVITAMIN W/MINERALS CH
1.0000 | ORAL_TABLET | Freq: Every day | ORAL | Status: DC
  Administered 2020-02-07 – 2020-02-17 (×11): 1 via ORAL
  Filled 2020-02-07 (×11): qty 1

## 2020-02-07 MED ORDER — PANTOPRAZOLE SODIUM 40 MG PO TBEC
40.0000 mg | DELAYED_RELEASE_TABLET | Freq: Every day | ORAL | Status: DC
  Administered 2020-02-07 – 2020-02-18 (×12): 40 mg via ORAL
  Filled 2020-02-07 (×12): qty 1

## 2020-02-07 MED ORDER — ASPIRIN 300 MG RE SUPP: 300.0000 mg | RECTAL | Status: DC

## 2020-02-07 MED ORDER — SODIUM CHLORIDE 0.9% FLUSH: 3.0000 mL | INTRAVENOUS | Status: DC | PRN

## 2020-02-07 MED ORDER — SODIUM CHLORIDE 0.9% FLUSH
3.0000 mL | Freq: Two times a day (BID) | INTRAVENOUS | Status: DC
  Administered 2020-02-07 – 2020-02-10 (×7): 3 mL via INTRAVENOUS

## 2020-02-07 MED ORDER — HEPARIN (PORCINE) 25000 UT/250ML-% IV SOLN
1000.0000 [IU]/h | INTRAVENOUS | Status: DC
  Administered 2020-02-07: 850 [IU]/h via INTRAVENOUS
  Administered 2020-02-08 – 2020-02-09 (×2): 950 [IU]/h via INTRAVENOUS
  Filled 2020-02-07 (×5): qty 250

## 2020-02-07 MED ORDER — ONDANSETRON HCL 4 MG/2ML IJ SOLN
4.0000 mg | Freq: Four times a day (QID) | INTRAMUSCULAR | Status: DC | PRN
  Administered 2020-02-07 – 2020-02-17 (×3): 4 mg via INTRAVENOUS
  Filled 2020-02-07 (×3): qty 2

## 2020-02-07 MED ORDER — LAMOTRIGINE 100 MG PO TABS
150.0000 mg | ORAL_TABLET | Freq: Every day | ORAL | Status: DC
  Administered 2020-02-07 – 2020-02-17 (×11): 150 mg via ORAL
  Filled 2020-02-07 (×11): qty 2

## 2020-02-07 MED ORDER — SODIUM CHLORIDE 0.9 % IV SOLN: INTRAVENOUS | Status: DC

## 2020-02-07 MED ORDER — FUROSEMIDE 10 MG/ML IJ SOLN
40.0000 mg | Freq: Once | INTRAMUSCULAR | Status: AC
  Administered 2020-02-07: 40 mg via INTRAVENOUS
  Filled 2020-02-07: qty 4

## 2020-02-07 MED ORDER — VITAMIN D 25 MCG (1000 UNIT) PO TABS
2000.0000 [IU] | ORAL_TABLET | Freq: Every day | ORAL | Status: DC
  Administered 2020-02-07 – 2020-02-08 (×2): 2000 [IU] via ORAL
  Filled 2020-02-07 (×2): qty 2

## 2020-02-07 MED ORDER — ACETAMINOPHEN 325 MG PO TABS: 650.0000 mg | ORAL_TABLET | ORAL | Status: DC | PRN

## 2020-02-07 MED ORDER — ATORVASTATIN CALCIUM 80 MG PO TABS
80.0000 mg | ORAL_TABLET | Freq: Every day | ORAL | Status: DC
  Administered 2020-02-07 – 2020-02-18 (×12): 80 mg via ORAL
  Filled 2020-02-07 (×12): qty 1

## 2020-02-07 MED ORDER — FENOFIBRATE 160 MG PO TABS
160.0000 mg | ORAL_TABLET | Freq: Every day | ORAL | Status: DC
  Administered 2020-02-07 – 2020-02-08 (×2): 160 mg via ORAL
  Filled 2020-02-07 (×2): qty 1

## 2020-02-07 MED ORDER — METOPROLOL TARTRATE 25 MG PO TABS
25.0000 mg | ORAL_TABLET | Freq: Two times a day (BID) | ORAL | Status: AC
  Administered 2020-02-07 – 2020-02-12 (×12): 25 mg via ORAL
  Filled 2020-02-07 (×12): qty 1

## 2020-02-07 MED ORDER — NITROGLYCERIN IN D5W 200-5 MCG/ML-% IV SOLN
0.0000 ug/min | INTRAVENOUS | Status: DC
  Administered 2020-02-07: 5 ug/min via INTRAVENOUS
  Filled 2020-02-07: qty 250

## 2020-02-07 MED ORDER — NITROGLYCERIN 0.4 MG SL SUBL: 0.4000 mg | SUBLINGUAL_TABLET | SUBLINGUAL | Status: DC | PRN

## 2020-02-07 MED ORDER — ASPIRIN EC 81 MG PO TBEC
81.0000 mg | DELAYED_RELEASE_TABLET | Freq: Every day | ORAL | Status: DC
  Administered 2020-02-09 – 2020-02-18 (×9): 81 mg via ORAL
  Filled 2020-02-07 (×11): qty 1

## 2020-02-07 MED ORDER — SODIUM CHLORIDE 0.9 % IV SOLN: 250.0000 mL | INTRAVENOUS | Status: DC | PRN

## 2020-02-07 MED ORDER — POLYETHYLENE GLYCOL 3350 17 G PO PACK: 17.0000 g | PACK | Freq: Every day | ORAL | Status: DC | PRN

## 2020-02-07 NOTE — ED Triage Notes (Signed)
Pt arrives via [facility] from home with c/o sudden onset of cp, radiating down both arms, onset 1 hour ago, describes as pressure. Nausea. Nitro x2, asa given en route. Hx of MI. Taken off Eliquis 2 days ago for dental procedure. Afib 70-80, hx of the same. 132/86, temp 98.1. IV established in the left forearm.

## 2020-02-07 NOTE — ED Notes (Signed)
Attempted report 

## 2020-02-07 NOTE — ED Notes (Signed)
Pt placed on 2L  for sob.   Sats remain between 91 and 95.

## 2020-02-07 NOTE — Progress Notes (Signed)
Per Audie Box, MD wife [name] is ok to stay overnights.

## 2020-02-07 NOTE — Progress Notes (Signed)
Eduardo [name] is an 86 year old gentleman with medical history of congestive heart failure, COPD, hyperlipidemia who was admitted overnight with non-STEMI.  His EKG demonstrates left bundle branch block with no acute ischemic changes.  He is atrial fibrillation.  Troponins have trended up.  He has ruled in for non-STEMI.  He is currently with 1 out of 10 chest pain on a nitro drip.  He is on heparin and has received aspirin.  We have started him on high intensity statin.  Eliquis has been held for least 1 week per his report.  We will also start him on metoprolol.  Echocardiogram is pending.  I had extensive discussion with the patient and his wife.  I recommended left heart catheterization.  Apparently he had a complication of RCA dissection in 2012 at the time of the diagnostic left heart catheterization.  This resulted in an extended ICU stay.  There are reluctant to proceed with repeat left heart catheterization.  I have assured them that this is via a right radial approach this time.  The complication risk is lower.  For now, they will think about this.  We will plan for left heart catheterization the morning I will keep him n.p.o. at midnight.  Should they change their mind we will discuss this in the a.m.  Currently his chest pain is well controlled.  He is without significant discomfort.  He was sleeping at the time of my initial interview.  I see no need for urgent left heart catheterization today.  He is a bit volume up and we will give him Lasix 40 mg today.  Overall appears to be comfortable.  [place] T. Audie Box, [place]  [address] [place], [place] [address] [phone]  12:11 PM

## 2020-02-07 NOTE — Progress Notes (Signed)
Troponin noted to be around 20,000.  He remains on nitro drip.  He reports minimal chest pain and is resting comfortably.  Echocardiogram with reduced EF and severe hypokinesis of the septum/anterior wall concerning for LAD infarction.  Repeat EKG without ST elevation.  He does have a chronic left bundle.  I did briefly discuss the case with interventional cardiology.  Given that he is resting comfortably and has advanced age and is a bit volume up we would prefer to optimize him medically before proceeding with left heart catheterization.  I will keep him n.p.o. moving forward in case he has pain that cannot be controlled with medication.  That would be our only indication for cardiac cath overnight.  We will continue to trend troponin values and continue with diuresis.  [place] T. Audie Box, [place]  [address], [place], [place] [address] [phone]  4:46 PM

## 2020-02-07 NOTE — Plan of Care (Signed)

## 2020-02-07 NOTE — ED Provider Notes (Addendum)
[facility] EMERGENCY DEPARTMENT Provider Note   CSN: [id number] Arrival date & time: 02/07/20  0114     History Chief Complaint  Patient presents with  . Chest Pain    Eduardo [name] is a 86 y.o. male.  HPI     This is an 86 year old male with a history of coronary artery disease, congestive heart failure, COPD, hyperlipidemia who presents with chest pain. Patient had acute onset of chest pain while in bed earlier this evening. He reports sharp anterior chest pain that radiated down both arms. Onset around midnight. He also describes it as pressure. He had some nausea and one episode of nonbilious, nonbloody emesis. He came off his Eliquis for a dental procedure. Currently he rates his pain at 2 out of 10. Not improved with nitroglycerin in route. States that his pain feels similar to when he had his prior heart attack. No recent cough, fever, infectious symptoms. No known sick contacts or Covid exposures. Denies back pain. Fully vaccinated for COVID-19.  Patient followed by Dr. Rockey Situ. And chart review reveals cardiac catheterization in 2012 with LAD and RCA lesions. Had a subsequent RCA dissection treated medically.  Past Medical History:  Diagnosis Date  . Allergy   . Anemia 2012, 08/2015   chronic. Acute due to large hematoma 2013. 2017: due to gastric AVM bleeding.   . Anxiety   . CAD (coronary artery disease)    a. 06/2010 Cath: LM nl, LAD 50p/m, LCX 10m, RCA 50p/m -->catheter dissection but good flow, 70d, RPDA 50-70;  b. 06/2010 Relook cath due to bradycardia and inf ST elev: RCA dissection @ ostium extending into coronary cusp and prox RCA, Ao dissection-->less staining than previously-->Med Rx; c. 08/2018 MV: No ischemia.  . Chronic combined systolic (congestive) and diastolic (congestive) heart failure ([place])    a. 12/2014 Echo: EF 60-65%; b. 07/2016 Echo: EF 45-50%, mild LVH. Mild AS/MS. Mod-sev MR. Mild to mod TR. PASP 41mmHg; c. 08/2018 Echo: EF 55-60%,  RVSP 68.50mmHg. Sev dil LA, mod dil RA. at least mod MR, mild to mod TR. Mild AS.  Marland Kitchen COPD (chronic obstructive pulmonary disease) ([place]) 2013   exertional dyspnea  . Gastric angiodysplasia with hemorrhage 08/2015  . Hx of colonic polyps 2001, 2011   adenomatous 2001. HP 2001, 2011.  Marland Kitchen Hyperlipidemia   . Mitral regurgitation    a. 07/2016 Echo: Mod to sev MR; b. 08/2018 Echo: at least moderate MR.  . Myocardial infarction ([place]) 06/2010.  12/2011.   "twice; 1 day apart; during/after cath". NQMI in setting severe anemia 2013.   Marland Kitchen Neuropathy 2014   in feet, likely due to spinal stenosis, DDD, HNP  . Osteoarthritis 2002   spinal stenosis, spondylolisthesis, disc protrusion multilevel in lumbar spine  . Permanent atrial fibrillation ([place]) 2012   a.  CHA2DS2VASc = 6-->eliquis;  b. 12/2015 Echo: EF 60-65%, no rwma, LVH, mild AS/MS/MR, sev dil LA, mild TR, PASP 33mmHg.  . Stroke ([place]) 2016  . Upper GI bleed 08/2015   EGD: 2 small gastric AVMs, 1 actively bleeding.  Both treated with APC ablation, clipping of the bleeder    Patient Active Problem List   Diagnosis Date Noted  . Left flank pain 10/06/2019  . Rib pain on right side 08/03/2019  . Open arm wound, right, subsequent encounter 07/07/2019  . GERD (gastroesophageal reflux disease) 07/02/2019  . CAD (coronary artery disease), native coronary artery 12/28/2017  . Bilateral carotid artery stenosis 12/04/2017  . Alcohol dependence  in remission ([place]) 06/24/2017  . Imbalance 02/05/2017  . Mitral regurgitation 07/17/2016  . Dyslipidemia 07/14/2016  . Encounter for anticoagulation discussion and counseling 05/03/2015  . Chronic anticoagulation 03/01/2015  . 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 TIA (transient ischemic attack) 12/31/2014  . Advanced directives, counseling/discussion 04/19/2014  . Spinal stenosis, lumbar region, with neurogenic claudication 12/23/2012  . Osteoarthritis, multiple sites 10/08/2012  .  Preventative health care 02/25/2012  . MI, old with prior NSTEMI x 2 12/28/2011  . Chronic diastolic HF (heart failure) ([place]) 12/28/2011    Class: Acute  . Edema 09/20/2010  . Allergic rhinitis 11/11/2008  . Mood disorder ([place]) 04/14/2008  . Essential hypertension 04/14/2008  . Chronic atrial fibrillation ([place]) 04/14/2008  . COPD GOLD II B 04/14/2008    Past Surgical History:  Procedure Laterality Date  . APPENDECTOMY    . CARDIAC CATHETERIZATION  2012   50% LAD and luminal irreg in RCA, 1/12  Post cath MI from damage  . CARPAL TUNNEL RELEASE     left  . CARPAL TUNNEL RELEASE  10/11/11   Dr Rush Barer  . CATARACT EXTRACTION W/ INTRAOCULAR LENS IMPLANT  2/13   Dr [name]  . COLONOSCOPY  2001, 02/2010   Dr Deatra Ina.   . ESOPHAGOGASTRODUODENOSCOPY N/A 08/15/2015   [name] MD; 2 small gastric AVMs, 1 actively bleeding.  Both treated with APC ablation, clipping of the bleeder  . FOOT FRACTURE SURGERY     right heel repair  . FRACTURE SURGERY    . KNEE ARTHROSCOPY     left, right knee 10/2009  . MASTOIDECTOMY     x3 on right  . NASAL SEPTUM SURGERY     nasal septal repair  . PENILE PROSTHESIS  REMOVAL    . PENILE PROSTHESIS IMPLANT     x 2--got infection after 2nd and had to remove  . PILONIDAL CYST / SINUS EXCISION    . SHOULDER ARTHROSCOPY     right  . TONSILLECTOMY AND ADENOIDECTOMY         Family History  Problem Relation Age of Onset  . Cancer Mother        ? leukemia  . Heart attack Father   . Stroke Maternal Grandmother   . Diabetes Neg Hx   . Hypertension Neg Hx     Social History   Tobacco Use  . Smoking status: Former Smoker    Packs/day: 2.50    Years: 30.00    Pack years: 75.00    Types: Cigarettes    Quit date: 06/11/1978    Years since quitting: 41.6  . Smokeless tobacco: Never Used  Vaping Use  . Vaping Use: Never used  Substance Use Topics  . Alcohol use: No    Comment: QUIT 0093 alcoholic  . Drug use: No    Home  Medications Prior to Admission medications   Medication Sig Start Date End Date Taking? Authorizing Provider  albuterol (PROVENTIL) (2.5 MG/3ML) 0.083% nebulizer solution Use 1 vial in nebulizer every 4 hours as needed for wheezing or shortness of breath 02/09/19  Yes Byrum, Rose Fillers, MD  ALPRAZolam Duanne Moron) 0.25 MG tablet Take 1 tablet (0.25 mg total) by mouth 2 (two) times daily as needed for anxiety. 12/11/17  Yes Venia Carbon, MD  atorvastatin (LIPITOR) 80 MG tablet Take 1 tablet (80 mg total) by mouth daily. 07/03/19  Yes Gollan, Kathlene November, MD  B Complex-C (SUPER B COMPLEX PO) Take 1 tablet by mouth at bedtime.  Yes [provider]  cetirizine (ZYRTEC) 10 MG tablet Take 10 mg by mouth at bedtime.    Yes [provider]  Cholecalciferol (VITAMIN D) 50 MCG (2000 UT) CAPS Take 2,000 Units by mouth daily.    Yes [provider]  Coenzyme Q10 ([employer] COQ-10 PO) Take 300 mg by mouth daily.    Yes [provider]  ELIQUIS 5 MG TABS tablet TAKE ONE TABLET BY MOUTH TWICE DAILY  Patient taking differently: Take 5 mg by mouth 2 (two) times daily.  09/11/19  Yes Gollan, Kathlene November, MD  escitalopram (LEXAPRO) 20 MG tablet Take 20 mg by mouth at bedtime.    Yes [provider]  fenofibrate 160 MG tablet TAKE ONE TABLET BY MOUTH DAILY AT BEDTIME  Patient taking differently: Take 160 mg by mouth at bedtime.  03/27/19  Yes Venia Carbon, MD  furosemide (LASIX) 40 MG tablet TAKE ONE TABLET BY MOUTH ONE TIME DAILY Patient taking differently: Take 40 mg by mouth daily.  02/05/20  Yes [name] I, MD  hydrALAZINE (APRESOLINE) 10 MG tablet TAKE ONE TABLET BY MOUTH THREE TIMES DAILY  Patient taking differently: Take 10 mg by mouth 3 (three) times daily.  10/16/19  Yes Minna Merritts, MD  ipratropium (ATROVENT) 0.03 % nasal spray INSTILL Two sprays into both nostrils at bedtime Patient taking differently: Place 2 sprays into both nostrils at bedtime. INSTILL TWO  SPRAYS INTO BOTH NOSTRILS AT BEDTIME 11/06/19  Yes Collene Gobble, MD  lamoTRIgine (LAMICTAL) 150 MG tablet Take 150 mg by mouth at bedtime. Reported on 08/14/2015 07/07/15  Yes [provider]  montelukast (SINGULAIR) 10 MG tablet TAKE ONE TABLET BY MOUTH DAILY AT BEDTIME  Patient taking differently: Take 10 mg by mouth at bedtime.  09/07/19  Yes Collene Gobble, MD  Multiple Vitamin (MULTIVITAMIN) tablet Take 1 tablet by mouth at bedtime.    Yes [provider]  nitroGLYCERIN (NITROSTAT) 0.4 MG SL tablet Place 1 tablet (0.4 mg total) under the tongue every 5 (five) minutes as needed for chest pain. 08/19/18 02/07/20 Yes Theora Gianotti, NP  pantoprazole (PROTONIX) 40 MG tablet TAKE 1 TABLET BY MOUTH DAILY BEFORE BREAKFAST Patient taking differently: Take 40 mg by mouth daily. TAKE 1 TABLET BY MOUTH DAILY BEFORE BREAKFAST. 02/05/20  Yes Venia Carbon, MD  STIOLTO RESPIMAT 2.5-2.5 MCG/ACT AERS inhale 2 puffs by mouth into the lungs once daily Patient taking differently: Inhale 2 puffs into the lungs daily.  09/15/19  Yes Collene Gobble, MD  triamcinolone cream (KENALOG) 0.1 % Apply 1 application topically 2 (two) times daily as needed (for skin).  08/10/15  Yes [provider]  verapamil (CALAN-SR) 240 MG CR tablet TAKE ONE TABLET BY MOUTH DAILY AT BEDTIME Patient taking differently: Take 240 mg by mouth daily.  11/27/19  Yes Gollan, Kathlene November, MD  polyethylene glycol (MIRALAX / GLYCOLAX) 17 g packet Take 17 g by mouth daily as needed for moderate constipation.     [provider]    Allergies    Penicillins, Sulfonamide derivatives, and Tape  Review of Systems   Review of Systems  Constitutional: Negative for fever.  Respiratory: Negative for shortness of breath.   Cardiovascular: Positive for chest pain and leg swelling.  Gastrointestinal: Positive for nausea. Negative for abdominal pain.  Genitourinary: Negative for dysuria.  All other systems  reviewed and are negative.   Physical Exam Updated Vital Signs BP (!) 142/81   Pulse  86   Temp 98 F (36.7 C) (Oral)   Resp (!) 22   SpO2 94%   Physical Exam Vitals and nursing note reviewed.  Constitutional:      Appearance: He is well-developed. He is not ill-appearing.     Comments: elderly, nontoxic-appearing  HENT:     Head: Normocephalic and atraumatic.  Eyes:     Pupils: Pupils are equal, round, and reactive to light.  Cardiovascular:     Rate and Rhythm: Normal rate. Rhythm irregular.     Heart sounds: Normal heart sounds. No murmur heard.   Pulmonary:     Effort: Pulmonary effort is normal. No respiratory distress.     Breath sounds: Normal breath sounds. No wheezing.  Abdominal:     General: Bowel sounds are normal.     Palpations: Abdomen is soft.     Tenderness: There is no abdominal tenderness. There is no rebound.  Musculoskeletal:     Cervical back: Neck supple.     Right lower leg: Edema present.     Left lower leg: Edema present.  Lymphadenopathy:     Cervical: No cervical adenopathy.  Skin:    General: Skin is warm and dry.  Neurological:     Mental Status: He is alert and oriented to person, place, and time.     ED Results / Procedures / Treatments   Labs (all labs ordered are listed, but only abnormal results are displayed) Labs Reviewed  BASIC METABOLIC PANEL - Abnormal; Notable for the following components:      Result Value   Glucose, Bld 124 (*)    BUN 37 (*)    All other components within normal limits  CBC - Abnormal; Notable for the following components:   RBC 3.54 (*)    Hemoglobin 10.5 (*)    HCT 32.8 (*)    RDW 16.0 (*)    All other components within normal limits  TROPONIN I (HIGH SENSITIVITY) - Abnormal; Notable for the following components:   Troponin I (High Sensitivity) 33 (*)    All other components within normal limits  TROPONIN I (HIGH SENSITIVITY) - Abnormal; Notable for the following components:   Troponin I (High  Sensitivity) 414 (*)    All other components within normal limits  SARS CORONAVIRUS 2 BY RT PCR (HOSPITAL ORDER, [facility])  HEPARIN LEVEL (UNFRACTIONATED)  APTT    EKG EKG Interpretation  Date/Time:  Sunday February 07 2020 01:25:36 EDT Ventricular Rate:  72 PR Interval:    QRS Duration: 130 QT Interval:  452 QTC Calculation: 494 R Axis:   73 Text Interpretation: Atrial fibrillation with premature ventricular or aberrantly conducted complexes Non-specific intra-ventricular conduction block Abnormal ECG Confirmed by Thayer Jew [phone]) on 02/07/2020 4:44:39 AM   Radiology DG Chest 2 View  Result Date: 02/07/2020 CLINICAL DATA:  Chest pain EXAM: CHEST - 2 VIEW COMPARISON:  08/03/2019, 08/10/2019, 12/30/2017 FINDINGS: No significant pleural effusion. Cardiomegaly with mild central vascular congestion. Probable mild interstitial edema. Aortic atherosclerosis. No pneumothorax. IMPRESSION: Cardiomegaly with vascular congestion and probable mild interstitial edema. Electronically Signed   By: Donavan Foil M.D.   On: 02/07/2020 02:34    Procedures Procedures (including critical care time)  CRITICAL CARE Performed by: Merryl Hacker   Total critical care time: 45 minutes  Critical care time was exclusive of separately billable procedures and treating other patients.  Critical care was necessary to treat or prevent imminent or life-threatening deterioration.  Critical care  was time spent personally by me on the following activities: development of treatment plan with patient and/or surrogate as well as nursing, discussions with consultants, evaluation of patient's response to treatment, examination of patient, obtaining history from patient or surrogate, ordering and performing treatments and interventions, ordering and review of laboratory studies, ordering and review of radiographic studies, pulse oximetry and re-evaluation of patient's  condition.   Medications Ordered in ED Medications  nitroGLYCERIN 50 mg in dextrose 5 % 250 mL (0.2 mg/mL) infusion (5 mcg/min Intravenous New Bag/Given 02/07/20 0547)  heparin ADULT infusion 100 units/mL (25000 units/249mL sodium chloride 0.45%) (850 Units/hr Intravenous New Bag/Given 02/07/20 0546)    ED Course  I have reviewed the triage vital signs and the nursing notes.  Pertinent labs & imaging results that were available during my care of the patient were reviewed by me and considered in my medical decision making (see chart for details).  Clinical Course as of Feb 06 610  Nancy Fetter Feb 07, 2020  0611 Spoke with cardiology, Dr. Juliane Lack.  He will have the patient evaluated.   [CH]    Clinical Course User Index [CH] Reika Callanan, Barbette Hair, MD   MDM Rules/Calculators/A&P                           Patient presents with chest pain. Acute in onset. He is overall nontoxic and vital signs are reassuring. He is in atrial fibrillation. EKG without acute ischemic changes and initial troponin 33. ACS is certainly consideration. Other considerations include but not limited to PE, dissection. Patient is clinically well-appearing and in no acute distress. Have lower suspicion for these given clinical findings and history. Chest x-ray obtained and shows no evidence of pneumothorax or pneumonia. He does have evidence of some volume overload and vascular congestion. He is not hypoxic. Basic metabolic panel is reassuring. Repeat troponin is 414. This is highly concerning for possible ACS. Patient started on heparin and nitroglycerin. We will plan for cardiology consultation for likely NSTEMI.  Final Clinical Impression(s) / ED Diagnoses Final diagnoses:  NSTEMI (non-ST elevated myocardial infarction) [facility])    Rx / [place] Orders ED Discharge Orders    None       Zharia Conrow, Barbette Hair, MD 02/07/20 0532    Merryl Hacker, MD 02/07/20 [phone]

## 2020-02-07 NOTE — ED Triage Notes (Signed)
Pt states that he was lying down in bed and started having a pain across his chest and onset of nausea, shortness of breath. Pain still 9/10 after 2 nitro. Hx of MI and COPD.

## 2020-02-07 NOTE — H&P (Signed)
Chief Complaint: Chest pain.  HPI: This is an 86 year old male with a history of coronary artery disease, congestive heart failure, COPD, hyperlipidemia who presents with chest pain. Patient had acute onset of chest pain while in bed earlier this evening. He reports anterior chest pain that radiated down both arms. Onset around midnight. He also describes it as pressure. He had some nausea and one episode of nonbilious, nonbloody emesis. He came off his Eliquis for a dental procedure and has been off it since last Sunday. Currently he rates his pain at 2 out of 10 with the worst being 9/10 yesterday. Pain similar to the last heart attack. Not improved with nitroglycerin in route. States that his pain feels similar to when he had his prior heart attack. No recent cough, fever, infectious symptoms. No known sick contacts or Covid exposures Fully vaccinated for COVID-19.Eduardo [name] Denies back pain.   Patient followed by Dr. Rockey Situ. And chart review reveals cardiac catheterization in 2012 with LAD and RCA lesions. Had a subsequent RCA dissection treated medically. He is not keen on undergoing the heart cath even though he does not seem to be resistant to the idea of a cath.    Review of Systems:     Cardiac Review of Systems: {Y] = yes [ ]  = no  Chest Pain [ y   ]  Resting SOB [   ] Exertional SOB  Blue.Reese  ]  Orthopnea [  ]   Pedal Edema [   ]    Palpitations [  ] Syncope  [  ]   Presyncope [   ]  General Review of Systems: [Y] = yes [  ]=no Constitional: recent weight change [  ]; anorexia [  ]; fatigue [  ]; nausea [  ]; night sweats [  ]; fever [  ]; or chills [  ];                                                                     Dental: poor dentition[  ];   Eye : blurred vision [  ]; diplopia [   ]; vision changes [  ];  Amaurosis fugax[  ]; Resp: cough [  ];  wheezing[ y ];  hemoptysis[  ]; shortness of breath[  ]; paroxysmal nocturnal dyspnea[  ]; dyspnea on exertion[  ]; or orthopnea[  ];  GI:   gallstones[  ], vomiting[ y ];  dysphagia[  ]; melena[  ];  hematochezia [  ]; heartburn[  ];   GU: kidney stones [  ]; hematuria[  ];   dysuria [  ];  nocturia[  ];               Skin: rash [  ], swelling[  ];, hair loss[  ];  peripheral edema[  ];  or itching[  ]; Musculosketetal: myalgias[  ];  joint swelling[  ];  joint erythema[  ];  joint pain[  ];  back pain[  ];  Heme/Lymph: bruising[  y];  bleeding[  ];  anemia[  ];  Neuro: TIA[  ];  headaches[  ];  stroke[  ];  vertigo[  ];  seizures[  ];   paresthesias[  ];  difficulty walking[  ];  Psych:depression[  ]; anxiety[  ];  Endocrine: diabetes[  ];  thyroid dysfunction[  ];   Past Medical History:  Diagnosis Date   Allergy    Anemia 2012, 08/2015   chronic. Acute due to large hematoma 2013. 2017: due to gastric AVM bleeding.    Anxiety    CAD (coronary artery disease)    a. 06/2010 Cath: LM nl, LAD 50p/m, LCX 72m, RCA 50p/m -->catheter dissection but good flow, 70d, RPDA 50-70;  b. 06/2010 Relook cath due to bradycardia and inf ST elev: RCA dissection @ ostium extending into coronary cusp and prox RCA, Ao dissection-->less staining than previously-->Med Rx; c. 08/2018 MV: No ischemia.   Chronic combined systolic (congestive) and diastolic (congestive) heart failure ([place])    a. 12/2014 Echo: EF 60-65%; b. 07/2016 Echo: EF 45-50%, mild LVH. Mild AS/MS. Mod-sev MR. Mild to mod TR. PASP 46mmHg; c. 08/2018 Echo: EF 55-60%, RVSP 68.65mmHg. Sev dil LA, mod dil RA. at least mod MR, mild to mod TR. Mild AS.   COPD (chronic obstructive pulmonary disease) ([place]) 2013   exertional dyspnea   Gastric angiodysplasia with hemorrhage 08/2015   Hx of colonic polyps 2001, 2011   adenomatous 2001. HP 2001, 2011.   Hyperlipidemia    Mitral regurgitation    a. 07/2016 Echo: Mod to sev MR; b. 08/2018 Echo: at least moderate MR.   Myocardial infarction ([place]) 06/2010.  12/2011.   "twice; 1 day apart; during/after cath". NQMI in setting severe anemia 2013.    Neuropathy  2014   in feet, likely due to spinal stenosis, DDD, HNP   Osteoarthritis 2002   spinal stenosis, spondylolisthesis, disc protrusion multilevel in lumbar spine   Permanent atrial fibrillation ([place]) 2012   a.  CHA2DS2VASc = 6-->eliquis;  b. 12/2015 Echo: EF 60-65%, no rwma, LVH, mild AS/MS/MR, sev dil LA, mild TR, PASP 68mmHg.   Stroke [facility]) 2016   Upper GI bleed 08/2015   EGD: 2 small gastric AVMs, 1 actively bleeding.  Both treated with APC ablation, clipping of the bleeder    No current facility-administered medications on file prior to encounter.   Current Outpatient Medications on File Prior to Encounter  Medication Sig Dispense Refill   albuterol (PROVENTIL) (2.5 MG/3ML) 0.083% nebulizer solution Use 1 vial in nebulizer every 4 hours as needed for wheezing or shortness of breath 270 mL 2   ALPRAZolam (XANAX) 0.25 MG tablet Take 1 tablet (0.25 mg total) by mouth 2 (two) times daily as needed for anxiety. 20 tablet 0   atorvastatin (LIPITOR) 80 MG tablet Take 1 tablet (80 mg total) by mouth daily. 90 tablet 3   B Complex-C (SUPER B COMPLEX PO) Take 1 tablet by mouth at bedtime.      cetirizine (ZYRTEC) 10 MG tablet Take 10 mg by mouth at bedtime.      Cholecalciferol (VITAMIN D) 50 MCG (2000 UT) CAPS Take 2,000 Units by mouth daily.      Coenzyme Q10 ([employer] COQ-10 PO) Take 300 mg by mouth daily.      ELIQUIS 5 MG TABS tablet TAKE ONE TABLET BY MOUTH TWICE DAILY  (Patient taking differently: Take 5 mg by mouth 2 (two) times daily. ) 180 tablet 1   escitalopram (LEXAPRO) 20 MG tablet Take 20 mg by mouth at bedtime.      fenofibrate 160 MG tablet TAKE ONE TABLET BY MOUTH DAILY AT BEDTIME  (Patient taking differently: Take 160 mg by mouth at bedtime. ) 90 tablet 3  furosemide (LASIX) 40 MG tablet TAKE ONE TABLET BY MOUTH ONE TIME DAILY (Patient taking differently: Take 40 mg by mouth daily. ) 90 tablet 0   hydrALAZINE (APRESOLINE) 10 MG tablet TAKE ONE TABLET BY MOUTH THREE TIMES DAILY  (Patient  taking differently: Take 10 mg by mouth 3 (three) times daily. ) 270 tablet 0   ipratropium (ATROVENT) 0.03 % nasal spray INSTILL Two sprays into both nostrils at bedtime (Patient taking differently: Place 2 sprays into both nostrils at bedtime. INSTILL TWO SPRAYS INTO BOTH NOSTRILS AT BEDTIME) 30 mL 5   lamoTRIgine (LAMICTAL) 150 MG tablet Take 150 mg by mouth at bedtime. Reported on 08/14/2015     montelukast (SINGULAIR) 10 MG tablet TAKE ONE TABLET BY MOUTH DAILY AT BEDTIME  (Patient taking differently: Take 10 mg by mouth at bedtime. ) 90 tablet 3   Multiple Vitamin (MULTIVITAMIN) tablet Take 1 tablet by mouth at bedtime.      nitroGLYCERIN (NITROSTAT) 0.4 MG SL tablet Place 1 tablet (0.4 mg total) under the tongue every 5 (five) minutes as needed for chest pain. 90 tablet 3   pantoprazole (PROTONIX) 40 MG tablet TAKE 1 TABLET BY MOUTH DAILY BEFORE BREAKFAST (Patient taking differently: Take 40 mg by mouth daily. TAKE 1 TABLET BY MOUTH DAILY BEFORE BREAKFAST.) 90 tablet 0   STIOLTO RESPIMAT 2.5-2.5 MCG/ACT AERS inhale 2 puffs by mouth into the lungs once daily (Patient taking differently: Inhale 2 puffs into the lungs daily. ) 12 g 3   triamcinolone cream (KENALOG) 0.1 % Apply 1 application topically 2 (two) times daily as needed (for skin).      verapamil (CALAN-SR) 240 MG CR tablet TAKE ONE TABLET BY MOUTH DAILY AT BEDTIME (Patient taking differently: Take 240 mg by mouth daily. ) 90 tablet 3   polyethylene glycol (MIRALAX / GLYCOLAX) 17 g packet Take 17 g by mouth daily as needed for moderate constipation.        Allergies  Allergen Reactions   Penicillins Itching    Has patient had a PCN reaction causing immediate rash, facial/tongue/throat swelling, SOB or lightheadedness with hypotension: Yes Has patient had a PCN reaction causing severe rash involving mucus membranes or skin necrosis: No Has patient had a PCN reaction that required hospitalization No Has patient had a PCN reaction  occurring within the last 10 years: No If all of the above answers are "NO", then may proceed with Cephalosporin use.    Sulfonamide Derivatives Other (See Comments)    "it affected my kidneys"   Tape Other (See Comments)    Arms black and blue, tears skin.  Please use "paper" tape    Social History   Socioeconomic History   Marital status: Married    Spouse name: Not on file   Number of children: 4   Years of education: some college   Highest education level: Not on file  Occupational History   Occupation: Retired Chief Strategy Officer the job trainin)  Tobacco Use   Smoking status: Former Smoker    Packs/day: 2.50    Years: 30.00    Pack years: 75.00    Types: Cigarettes    Quit date: 06/11/1978    Years since quitting: 41.6   Smokeless tobacco: Never Used  Vaping Use   Vaping Use: Never used  Substance and Sexual Activity   Alcohol use: No    Comment: QUIT 1749 alcoholic   Drug use: No   Sexual activity: Not Currently  Other Topics Concern  Not on file  Social History Narrative   Grew up in [place], [place].  Lives in [place].   Married---2nd   2 children ([place], [place]  and near [place])--2 stepchildren   Former Smoker--quit in 1980   Alcohol use-no. Quit in 1980--alcoholic      Has living will.   Has designated wife, then step daughter [name] have health care POA.   Would accept resuscitation attempts.   No feeding tube if cognitively unaware.   Social Determinants of Health   Financial Resource Strain:    Difficulty of Paying Living Expenses: Not on file  Food Insecurity:    Worried About Charity fundraiser in the Last Year: Not on file   [employer] of Food in the Last Year: Not on file  Transportation Needs:    Lack of Transportation (Medical): Not on file   Lack of Transportation (Non-Medical): Not on file  Physical Activity:    Days of Exercise per Week: Not on file   Minutes of Exercise per Session: Not on file  Stress:    Feeling of  Stress : Not on file  Social Connections:    Frequency of Communication with Friends and Family: Not on file   Frequency of Social Gatherings with Friends and Family: Not on file   Attends Religious Services: Not on file   Active Member of Clubs or Organizations: Not on file   Attends Archivist Meetings: Not on file   Marital Status: Not on file  Intimate Partner Violence:    Fear of Current or Ex-Partner: Not on file   Emotionally Abused: Not on file   Physically Abused: Not on file   Sexually Abused: Not on file    Family History  Problem Relation Age of Onset   Cancer Mother        ? leukemia   Heart attack Father    Stroke Maternal Grandmother    Diabetes Neg Hx    Hypertension Neg Hx     PHYSICAL EXAM: Vitals:   02/07/20 0515 02/07/20 0530  BP: 134/75 (!) 142/81  Pulse: 97 86  Resp: 17 (!) 22  Temp:    SpO2: 95% 94%   General:  Well appearing. No respiratory difficulty HEENT: normal Neck: supple. no JVD. Carotids 2+ bilat; no bruits. No lymphadenopathy or thryomegaly appreciated. Cor: PMI nondisplaced. Irregular rate & rhythm. No rubs, gallops or murmurs. Lungs: CTAB Abdomen: soft, nontender, nondistended. No hepatosplenomegaly. No bruits or masses. Good bowel sounds. Extremities: no cyanosis, clubbing, rash, edema Neuro: alert & oriented x 3, cranial nerves grossly intact. moves all 4 extremities w/o difficulty. Affect pleasant.  ECG: Afib with LBBB and PVC/aberrant conduction. No ischemia changes compared to previous EKG.  Echo: 2020 1. The left ventricle has normal systolic function, with an ejection  fraction of 55-60%. The cavity size was normal. Left ventricular  diastology could not be evaluated secondary to atrial fibrillation. No  evidence of left ventricular regional wall  motion abnormalities.   2. The right ventricle has mildly reduced systolic function. The cavity  was mildly enlarged. There is no increase in right ventricular wall    thickness. Right ventricular systolic pressure moderately to severely  elevated with an estimated pressure of 68.6   mmHg.   3. Left atrial size was severely dilated.   4. Right atrial size was moderately dilated.   5. The mitral valve is degenerative. Mild thickening of the mitral valve  leaflet. There is severe mitral  annular calcification present. Mitral  valve regurgitation is at least moderate in severity but may be  underestimated by jet eccentricity. No  evidence of mitral valve stenosis.   6. The tricuspid valve is normal in structure. Tricuspid valve  regurgitation is mild-moderate.   7. The aortic valve is tricuspid Moderate thickening of the aortic valve  Mild calcification of the aortic valve. Aortic valve regurgitation is  trivial by color flow Doppler. mild stenosis of the aortic valve.   8. The pulmonic valve was not well visualized. Pulmonic valve  regurgitation is mild by color flow Doppler.   9. The aortic root is normal in size and structure.  10. There is mild dilatation of the ascending aorta measuring 35 mm.  11. The inferior vena cava was dilated in size with <50% respiratory  variability.  12. No evidence of left ventricular regional wall motion abnormalities.  13. The interatrial septum was not well visualized.   Results for orders placed or performed during the hospital encounter of 02/07/20 (from the past 24 hour(s))  Basic metabolic panel     Status: Abnormal   Collection Time: 02/07/20  1:27 AM  Result Value Ref Range   Sodium 140 135 - 145 mmol/L   Potassium 3.7 3.5 - 5.1 mmol/L   Chloride 103 98 - 111 mmol/L   CO2 26 22 - 32 mmol/L   Glucose, Bld 124 (H) 70 - 99 mg/dL   BUN 37 (H) 8 - 23 mg/dL   Creatinine, Ser 1.07 0.61 - 1.24 mg/dL   Calcium 9.9 8.9 - 10.3 mg/dL   GFR calc non Af Amer >60 >60 mL/min   GFR calc Af Amer >60 >60 mL/min   Anion gap 11 5 - 15  CBC     Status: Abnormal   Collection Time: 02/07/20  1:27 AM  Result Value Ref Range    WBC 7.7 4.0 - 10.5 K/uL   RBC 3.54 (L) 4.22 - 5.81 MIL/uL   Hemoglobin 10.5 (L) 13.0 - 17.0 g/dL   HCT 32.8 (L) 39 - 52 %   MCV 92.7 80.0 - 100.0 fL   MCH 29.7 26.0 - 34.0 pg   MCHC 32.0 30.0 - 36.0 g/dL   RDW 16.0 (H) 11.5 - 15.5 %   Platelets 235 150 - 400 K/uL   nRBC 0.0 0.0 - 0.2 %  Troponin I (High Sensitivity)     Status: Abnormal   Collection Time: 02/07/20  1:27 AM  Result Value Ref Range   Troponin I (High Sensitivity) 33 (H) <18 ng/L  Troponin I (High Sensitivity)     Status: Abnormal   Collection Time: 02/07/20  3:33 AM  Result Value Ref Range   Troponin I (High Sensitivity) 414 (HH) <18 ng/L   DG Chest 2 View  Result Date: 02/07/2020 CLINICAL DATA:  Chest pain EXAM: CHEST - 2 VIEW COMPARISON:  08/03/2019, 08/10/2019, 12/30/2017 FINDINGS: No significant pleural effusion. Cardiomegaly with mild central vascular congestion. Probable mild interstitial edema. Aortic atherosclerosis. No pneumothorax. IMPRESSION: Cardiomegaly with vascular congestion and probable mild interstitial edema. Electronically Signed   By: Donavan Foil M.D.   On: 02/07/2020 02:34     ASSESSMENT and PLAN: NSTEMI: Patient has anginal symptoms and his trops are elevated. Given his previous cardiac history, he may be likely to have NSTEMI. Will plan for heart cath and treat patient for NSTEMI. Will hold plavix in anticipation for cardiac cath.  Atrial Fibrillation: Rate controlled. Off apixaban for a week for his dental  procedure. Patient will be fully anticoagulated for ACS.  COPD with cor pulmonale: Stable. Will continue home meds on discharge.  GERD: On pantoprazole which will be continued.

## 2020-02-07 NOTE — Progress Notes (Signed)
Echocardiogram 2D Echocardiogram has been performed.  Oneal Deputy Kynley Metzger 02/07/2020, 2:37 PM

## 2020-02-07 NOTE — Progress Notes (Addendum)
[facility] for heparin Indication: chest pain/ACS and atrial fibrillation  Allergies  Allergen Reactions  . Penicillins Itching    Has patient had a PCN reaction causing immediate rash, facial/tongue/throat swelling, SOB or lightheadedness with hypotension: Yes Has patient had a PCN reaction causing severe rash involving mucus membranes or skin necrosis: No Has patient had a PCN reaction that required hospitalization No Has patient had a PCN reaction occurring within the last 10 years: No If all of the above answers are "NO", then may proceed with Cephalosporin use.   . Sulfonamide Derivatives Other (See Comments)    "it affected my kidneys"  . Tape Other (See Comments)    Arms black and blue, tears skin.  Please use "paper" tape    Patient Measurements: Height: 5\' 2"  (157.5 cm) Weight 63 kg (138 lb 14.2 oz) Heparin Dosing Weight: 63 kg  Vital Signs: Temp: 98.3 F (36.8 C) (08/29 1007) Temp Source: Oral (08/29 1007) BP: 130/79 (08/29 1007) Pulse Rate: 84 (08/29 0915)  Labs: Recent Labs    02/07/20 0127 02/07/20 0333  HGB 10.5*  --   HCT 32.8*  --   PLT 235  --   CREATININE 1.07  --   TROPONINIHS 33* 414*    CrCl cannot be calculated (Unknown ideal weight.).   Medical History: Past Medical History:  Diagnosis Date  . Allergy   . Anemia 2012, 08/2015   chronic. Acute due to large hematoma 2013. 2017: due to gastric AVM bleeding.   . Anxiety   . CAD (coronary artery disease)    a. 06/2010 Cath: LM nl, LAD 50p/m, LCX 3m, RCA 50p/m -->catheter dissection but good flow, 70d, RPDA 50-70;  b. 06/2010 Relook cath due to bradycardia and inf ST elev: RCA dissection @ ostium extending into coronary cusp and prox RCA, Ao dissection-->less staining than previously-->Med Rx; c. 08/2018 MV: No ischemia.  . Chronic combined systolic (congestive) and diastolic (congestive) heart failure ([place])    a. 12/2014 Echo: EF 60-65%; b. 07/2016 Echo: EF  45-50%, mild LVH. Mild AS/MS. Mod-sev MR. Mild to mod TR. PASP 43mmHg; c. 08/2018 Echo: EF 55-60%, RVSP 68.14mmHg. Sev dil LA, mod dil RA. at least mod MR, mild to mod TR. Mild AS.  Marland Kitchen COPD (chronic obstructive pulmonary disease) ([place]) 2013   exertional dyspnea  . Gastric angiodysplasia with hemorrhage 08/2015  . Hx of colonic polyps 2001, 2011   adenomatous 2001. HP 2001, 2011.  Marland Kitchen Hyperlipidemia   . Mitral regurgitation    a. 07/2016 Echo: Mod to sev MR; b. 08/2018 Echo: at least moderate MR.  . Myocardial infarction ([place]) 06/2010.  12/2011.   "twice; 1 day apart; during/after cath". NQMI in setting severe anemia 2013.   Marland Kitchen Neuropathy 2014   in feet, likely due to spinal stenosis, DDD, HNP  . Osteoarthritis 2002   spinal stenosis, spondylolisthesis, disc protrusion multilevel in lumbar spine  . Permanent atrial fibrillation ([place]) 2012   a.  CHA2DS2VASc = 6-->eliquis;  b. 12/2015 Echo: EF 60-65%, no rwma, LVH, mild AS/MS/MR, sev dil LA, mild TR, PASP 22mmHg.  . Stroke ([place]) 2016  . Upper GI bleed 08/2015   EGD: 2 small gastric AVMs, 1 actively bleeding.  Both treated with APC ablation, clipping of the bleeder    Medications:  Scheduled:  . aspirin  324 mg Oral NOW   Or  . aspirin  300 mg Rectal NOW  . [START ON 02/08/2020] aspirin EC  81 mg Oral  Daily  . atorvastatin  80 mg Oral Daily  . cholecalciferol  2,000 Units Oral Daily  . fenofibrate  160 mg Oral QHS  . hydrALAZINE  10 mg Oral TID  . lamoTRIgine  150 mg Oral QHS  . multivitamin with minerals  1 tablet Oral QHS  . pantoprazole  40 mg Oral Daily   Infusions:  . heparin 850 Units/hr (02/07/20 0546)  . nitroGLYCERIN 35 mcg/min (02/07/20 0933)   PRN: acetaminophen, nitroGLYCERIN, ondansetron (ZOFRAN) IV, polyethylene glycol  Assessment: 86 yo male presents with chest pain and elevated troponin. Troponin upon presentation was 33 and is now 21,943. PTA the patient is on apixaban but it was being held for a dental procedure. The last  apixaban dose was the evening on 01/30/20. The patient is currently recommended to get a cardiac catheterization but the patient is unsure, therefore, will remain on heparin until a decision is made. Pharmacy is consulted to dose heparin.  Per the RN, there were no issues with the infusion and the patient does not have any signs or symptoms of bleeding. The patient's CBC is WNL.   The patient's heparin level is subtherapeutic at 0.27 and aPTT is 63 while running at 850 units/hr. The heparin level correlates to the aPTT, therefore will discontinue aPTT and monitor with heparin levels only.  Goal of Therapy:  Heparin level 0.3-0.7 units/ml aPTT 66-102 seconds Monitor platelets by anticoagulation protocol: Yes   Plan:  Increase heparin IV to 950 units/hour Obtain 8 hour heparin level Monitor daily heparin level and CBC Monitor for signs and symptoms of bleeding Follow-up cardiac catheterization plans  Shauna Hugh, PharmD, [place]  PGY-1 Pharmacy Resident 02/07/2020 10:22 AM  Please check [email or web address] for unit-specific pharmacy phone numbers.

## 2020-02-07 NOTE — ED Notes (Signed)
Patients wife would like to be contacted when patient gets to treatment room

## 2020-02-07 NOTE — Progress Notes (Signed)
ANTICOAGULATION CONSULT NOTE - Initial Consult  Pharmacy Consult for Heparin (Apixaban on hold) Indication: chest pain/ACS and atrial fibrillation  Allergies  Allergen Reactions  . Penicillins Itching    Has patient had a PCN reaction causing immediate rash, facial/tongue/throat swelling, SOB or lightheadedness with hypotension: Yes Has patient had a PCN reaction causing severe rash involving mucus membranes or skin necrosis: No Has patient had a PCN reaction that required hospitalization No Has patient had a PCN reaction occurring within the last 10 years: No If all of the above answers are "NO", then may proceed with Cephalosporin use.   . Sulfonamide Derivatives Other (See Comments)    "it affected my kidneys"  . Tape Other (See Comments)    Arms black and blue, tears skin.  Please use "paper" tape     Vital Signs: Temp: 98 F (36.7 C) (08/29 0144) Temp Source: Oral (08/29 0144) BP: 128/63 (08/29 0144) Pulse Rate: 72 (08/29 0144)  Labs: Recent Labs    02/07/20 0127 02/07/20 0333  HGB 10.5*  --   HCT 32.8*  --   PLT 235  --   CREATININE 1.07  --   TROPONINIHS 33* 414*    CrCl cannot be calculated (Unknown ideal weight.).   Medical History: Past Medical History:  Diagnosis Date  . Allergy   . Anemia 2012, 08/2015   chronic. Acute due to large hematoma 2013. 2017: due to gastric AVM bleeding.   . Anxiety   . CAD (coronary artery disease)    a. 06/2010 Cath: LM nl, LAD 50p/m, LCX 56m, RCA 50p/m -->catheter dissection but good flow, 70d, RPDA 50-70;  b. 06/2010 Relook cath due to bradycardia and inf ST elev: RCA dissection @ ostium extending into coronary cusp and prox RCA, Ao dissection-->less staining than previously-->Med Rx; c. 08/2018 MV: No ischemia.  . Chronic combined systolic (congestive) and diastolic (congestive) heart failure ([place])    a. 12/2014 Echo: EF 60-65%; b. 07/2016 Echo: EF 45-50%, mild LVH. Mild AS/MS. Mod-sev MR. Mild to mod TR. PASP 3mmHg; c.  08/2018 Echo: EF 55-60%, RVSP 68.27mmHg. Sev dil LA, mod dil RA. at least mod MR, mild to mod TR. Mild AS.  Marland Kitchen COPD (chronic obstructive pulmonary disease) ([place]) 2013   exertional dyspnea  . Gastric angiodysplasia with hemorrhage 08/2015  . Hx of colonic polyps 2001, 2011   adenomatous 2001. HP 2001, 2011.  Marland Kitchen Hyperlipidemia   . Mitral regurgitation    a. 07/2016 Echo: Mod to sev MR; b. 08/2018 Echo: at least moderate MR.  . Myocardial infarction ([place]) 06/2010.  12/2011.   "twice; 1 day apart; during/after cath". NQMI in setting severe anemia 2013.   Marland Kitchen Neuropathy 2014   in feet, likely due to spinal stenosis, DDD, HNP  . Osteoarthritis 2002   spinal stenosis, spondylolisthesis, disc protrusion multilevel in lumbar spine  . Permanent atrial fibrillation ([place]) 2012   a.  CHA2DS2VASc = 6-->eliquis;  b. 12/2015 Echo: EF 60-65%, no rwma, LVH, mild AS/MS/MR, sev dil LA, mild TR, PASP 58mmHg.  . Stroke ([place]) 2016  . Upper GI bleed 08/2015   EGD: 2 small gastric AVMs, 1 actively bleeding.  Both treated with APC ablation, clipping of the bleeder    Assessment: 86 y/o M on apixaban PTA for afib, now with chest pain and mildly elevated troponin. Has been off apixaban for 2 days for dental procedure, ok to start heparin now. Hgb 10.5. Renal function good. May need to use aPTT to dose for now.  Goal of Therapy:  Heparin level 0.3-0.7 units/ml aPTT 66-102 seconds Monitor platelets by anticoagulation protocol: Yes   Plan:  -Start heparin drip at 850 units/hr -1400 aPTT/heparin level -Daily CBC/heparin level/aPTT -Monitor for bleeding  Narda Bonds, PharmD, BCPS Clinical Pharmacist Phone: [phone]

## 2020-02-08 DIAGNOSIS — I4819 Other persistent atrial fibrillation: Secondary | ICD-10-CM

## 2020-02-08 LAB — CBC
HCT: 32.1 % — ABNORMAL LOW (ref 39.0–52.0)
Hemoglobin: 9.9 g/dL — ABNORMAL LOW (ref 13.0–17.0)
MCH: 28.1 pg (ref 26.0–34.0)
MCHC: 30.8 g/dL (ref 30.0–36.0)
MCV: 91.2 fL (ref 80.0–100.0)
Platelets: 253 10*3/uL (ref 150–400)
RBC: 3.52 MIL/uL — ABNORMAL LOW (ref 4.22–5.81)
RDW: 15.9 % — ABNORMAL HIGH (ref 11.5–15.5)
WBC: 11.1 10*3/uL — ABNORMAL HIGH (ref 4.0–10.5)
nRBC: 0 % (ref 0.0–0.2)

## 2020-02-08 LAB — BASIC METABOLIC PANEL
Anion gap: 11 (ref 5–15)
Anion gap: 11 (ref 5–15)
BUN: 43 mg/dL — ABNORMAL HIGH (ref 8–23)
BUN: 50 mg/dL — ABNORMAL HIGH (ref 8–23)
CO2: 24 mmol/L (ref 22–32)
CO2: 23 mmol/L (ref 22–32)
Calcium: 10 mg/dL (ref 8.9–10.3)
Calcium: 10.1 mg/dL (ref 8.9–10.3)
Chloride: 100 mmol/L (ref 98–111)
Chloride: 100 mmol/L (ref 98–111)
Creatinine, Ser: 1.85 mg/dL — ABNORMAL HIGH (ref 0.61–1.24)
Creatinine, Ser: 2.15 mg/dL — ABNORMAL HIGH (ref 0.61–1.24)
GFR calc Af Amer: 37 mL/min — ABNORMAL LOW (ref >60)
GFR calc Af Amer: 31 mL/min — ABNORMAL LOW (ref >60)
GFR calc non Af Amer: 32 mL/min — ABNORMAL LOW (ref >60)
GFR calc non Af Amer: 27 mL/min — ABNORMAL LOW (ref >60)
Glucose, Bld: 123 mg/dL — ABNORMAL HIGH (ref 70–99)
Glucose, Bld: 99 mg/dL (ref 70–99)
Potassium: 4.3 mmol/L (ref 3.5–5.1)
Potassium: 4.3 mmol/L (ref 3.5–5.1)
Sodium: 135 mmol/L (ref 135–145)
Sodium: 134 mmol/L — ABNORMAL LOW (ref 135–145)

## 2020-02-08 LAB — HEPARIN LEVEL (UNFRACTIONATED): Heparin Unfractionated: 0.45 IU/mL (ref 0.30–0.70)

## 2020-02-08 MED ORDER — ARFORMOTEROL TARTRATE 15 MCG/2ML IN NEBU
15.0000 ug | INHALATION_SOLUTION | Freq: Two times a day (BID) | RESPIRATORY_TRACT | Status: DC
  Administered 2020-02-08 – 2020-02-18 (×20): 15 ug via RESPIRATORY_TRACT
  Filled 2020-02-08 (×21): qty 2

## 2020-02-08 MED ORDER — ALBUTEROL SULFATE (2.5 MG/3ML) 0.083% IN NEBU: 2.5000 mg | INHALATION_SOLUTION | Freq: Four times a day (QID) | RESPIRATORY_TRACT | Status: DC | PRN

## 2020-02-08 MED ORDER — ESCITALOPRAM OXALATE 10 MG PO TABS
20.0000 mg | ORAL_TABLET | Freq: Every day | ORAL | Status: DC
  Administered 2020-02-08 – 2020-02-17 (×11): 20 mg via ORAL
  Filled 2020-02-08 (×11): qty 2

## 2020-02-08 MED ORDER — UMECLIDINIUM BROMIDE 62.5 MCG/INH IN AEPB
1.0000 | INHALATION_SPRAY | Freq: Every day | RESPIRATORY_TRACT | Status: DC
  Administered 2020-02-08 – 2020-02-18 (×11): 1 via RESPIRATORY_TRACT
  Filled 2020-02-08 (×2): qty 7

## 2020-02-08 NOTE — Progress Notes (Signed)
  Mobility Specialist Criteria Algorithm Info.  SATURATION QUALIFICATIONS: (This note is used to comply with regulatory documentation for home oxygen)  Patient Saturations on Room Air at Rest = 93%  Patient Saturations on Room Air while Ambulating = 88%  Patient Saturations on 2 Liters of oxygen while Ambulating = 91%  Please briefly explain why patient needs home oxygen: Needed 2L O2 to maintain an oxygen saturation >90% while ambulating  Mobility Team:  [facility] elevated:Self regulated Activity: Ambulated in hall;Transferred:  Bed to chair (to chair after ambulation) Range of motion: Active;All extremities Level of assistance: Minimal assist, patient does 75% or more; Standby supine/sit Assistive device: Front wheel walker Minutes sitting in chair:  Minutes stood: 3 minutes Minutes ambulated: 3 minutes Distance ambulated (ft): 40 ft Mobility response: Tolerated fair;RN notified (Needed prolonged standing rest break d/t SOB and fatigue) Bed Position: Chair  Pt willing and eager to participate in mobility this afternoon. Wife at the bedside is very supportive reporting that patient ambulates independently with RW and she assists minimally with all ADL's at home. She also reported patient had multiple falls within the last 6 months. Pt went from supine to sit with supervision and minimal HHA from sit/stand. Required cues for hand placement on RW and occasionally needs to be reminded to step into the walker. He appeared to be steady initially upon standing and taking a few steps but became increasingly unsteady at the onset of fatigue and SOB. Was able to ambulate 40 ft before needing seated rest break. Was not able to ambulate back to room, per pt he was too fatigued. VSS. Denied dizziness, lightheadedness, or pain. Pt now sitting in recliner chair with all needs met and call bell within reach.   02/08/2020 2:45 PM

## 2020-02-08 NOTE — Progress Notes (Signed)
ANTICOAGULATION CONSULT NOTE - Initial Consult  Pharmacy Consult for Heparin (Apixaban on hold) Indication: chest pain/ACS and atrial fibrillation  Allergies  Allergen Reactions  . Penicillins Itching    Has patient had a PCN reaction causing immediate rash, facial/tongue/throat swelling, SOB or lightheadedness with hypotension: Yes Has patient had a PCN reaction causing severe rash involving mucus membranes or skin necrosis: No Has patient had a PCN reaction that required hospitalization No Has patient had a PCN reaction occurring within the last 10 years: No If all of the above answers are "NO", then may proceed with Cephalosporin use.   . Sulfonamide Derivatives Other (See Comments)    "it affected my kidneys"  . Tape Other (See Comments)    Arms black and blue, tears skin.  Please use "paper" tape     Vital Signs: Temp: 97.8 F (36.6 C) (08/29 2326) Temp Source: Oral (08/29 2326) BP: 103/61 (08/30 0139) Pulse Rate: 69 (08/30 0139)  Labs: Recent Labs    02/07/20 0127 02/07/20 0127 02/07/20 0333 02/07/20 1206 02/07/20 1315 02/07/20 1535 02/08/20 0228  HGB 10.5*  --   --   --   --   --  9.9*  HCT 32.8*  --   --   --   --   --  32.1*  PLT 235  --   --   --   --   --  253  APTT  --   --   --   --  63*  --   --   HEPARINUNFRC  --   --   --   --  0.27*  --  0.45  CREATININE 1.07  --   --   --   --   --   --   TROPONINIHS 33*   < > 414* 21,943*  --  >27,000*  --    < > = values in this interval not displayed.    Estimated Creatinine Clearance: 38.3 mL/min (by C-G formula based on SCr of 1.07 mg/dL).   Medical History: Past Medical History:  Diagnosis Date  . Allergy   . Anemia 2012, 08/2015   chronic. Acute due to large hematoma 2013. 2017: due to gastric AVM bleeding.   . Anxiety   . CAD (coronary artery disease)    a. 06/2010 Cath: LM nl, LAD 50p/m, LCX 17m, RCA 50p/m -->catheter dissection but good flow, 70d, RPDA 50-70;  b. 06/2010 Relook cath due to  bradycardia and inf ST elev: RCA dissection @ ostium extending into coronary cusp and prox RCA, Ao dissection-->less staining than previously-->Med Rx; c. 08/2018 MV: No ischemia.  . Chronic combined systolic (congestive) and diastolic (congestive) heart failure ([place])    a. 12/2014 Echo: EF 60-65%; b. 07/2016 Echo: EF 45-50%, mild LVH. Mild AS/MS. Mod-sev MR. Mild to mod TR. PASP 10mmHg; c. 08/2018 Echo: EF 55-60%, RVSP 68.55mmHg. Sev dil LA, mod dil RA. at least mod MR, mild to mod TR. Mild AS.  Marland Kitchen COPD (chronic obstructive pulmonary disease) ([place]) 2013   exertional dyspnea  . Gastric angiodysplasia with hemorrhage 08/2015  . Hx of colonic polyps 2001, 2011   adenomatous 2001. HP 2001, 2011.  Marland Kitchen Hyperlipidemia   . Mitral regurgitation    a. 07/2016 Echo: Mod to sev MR; b. 08/2018 Echo: at least moderate MR.  . Myocardial infarction ([place]) 06/2010.  12/2011.   "twice; 1 day apart; during/after cath". NQMI in setting severe anemia 2013.   Marland Kitchen Neuropathy 2014   in feet,  likely due to spinal stenosis, DDD, HNP  . Osteoarthritis 2002   spinal stenosis, spondylolisthesis, disc protrusion multilevel in lumbar spine  . Permanent atrial fibrillation ([place]) 2012   a.  CHA2DS2VASc = 6-->eliquis;  b. 12/2015 Echo: EF 60-65%, no rwma, LVH, mild AS/MS/MR, sev dil LA, mild TR, PASP 77mmHg.  . Stroke ([place]) 2016  . Upper GI bleed 08/2015   EGD: 2 small gastric AVMs, 1 actively bleeding.  Both treated with APC ablation, clipping of the bleeder    Assessment: 86 y/o M on apixaban PTA for afib, now with chest pain and mildly elevated troponin. Has been off apixaban for 2 days for dental procedure, ok to start heparin now. Hgb 10.5. Renal function good. May need to use aPTT to dose for now.   8/30 AM update:  Heparin level therapeutic after rate increase  Goal of Therapy:  Heparin level 0.3-0.7 units/mL Monitor platelets by anticoagulation protocol: Yes   Plan:  -Cont heparin 950 units/hr -1200 heparin level  Narda Bonds, PharmD, BCPS Clinical Pharmacist Phone: [phone]

## 2020-02-08 NOTE — Progress Notes (Addendum)
Progress Note  Patient Name: Eduardo [name] Date of Encounter: 02/08/2020  [facility] Cardiologist: Ida Rogue, MD   Subjective   No recurrent chest pain. Breathing stable.   Inpatient Medications    Scheduled Meds: . aspirin EC  81 mg Oral Daily  . atorvastatin  80 mg Oral Daily  . cholecalciferol  2,000 Units Oral Daily  . fenofibrate  160 mg Oral QHS  . hydrALAZINE  10 mg Oral TID  . lamoTRIgine  150 mg Oral QHS  . metoprolol tartrate  25 mg Oral BID  . multivitamin with minerals  1 tablet Oral QHS  . pantoprazole  40 mg Oral Daily  . sodium chloride flush  3 mL Intravenous Q12H   Continuous Infusions: . sodium chloride    . sodium chloride 50 mL/hr at 02/08/20 0542  . heparin 950 Units/hr (02/07/20 1534)  . nitroGLYCERIN 25 mcg/min (02/08/20 0130)   PRN Meds: sodium chloride, acetaminophen, nitroGLYCERIN, ondansetron (ZOFRAN) IV, polyethylene glycol, sodium chloride flush   Vital Signs    Vitals:   02/07/20 2326 02/08/20 0127 02/08/20 0139 02/08/20 0520  BP: 112/72 (!) 84/44 103/61   Pulse: 62  69 74  Resp: 18   18  Temp: 97.8 F (36.6 C)   98.6 F (37 C)  TempSrc: Oral   Oral  SpO2: 98%  98% 98%  Weight:    63 kg  Height:        Intake/Output Summary (Last 24 hours) at 02/08/2020 0747 Last data filed at 02/08/2020 0634 Gross per 24 hour  Intake 726.48 ml  Output 650 ml  Net 76.48 ml   Last 3 Weights 02/08/2020 02/07/2020 11/17/2019  Weight (lbs) 138 lb 14.2 oz 138 lb 14.2 oz 144 lb 6 oz  Weight (kg) 63 kg 63 kg 65.488 kg      Telemetry    SR - Personally Reviewed  ECG    No new tracing   Physical Exam   GEN: No acute distress.   Neck: No JVD Cardiac: RRR, no murmurs, rubs, or gallops.  Respiratory: Clear to auscultation bilaterally. GI: Soft, nontender, non-distended  MS: No edema; No deformity. Neuro:  Nonfocal  Psych: Normal affect   Labs    High Sensitivity Troponin:   Recent Labs  Lab 02/07/20 0127 02/07/20 0333  02/07/20 1206 02/07/20 1535  TROPONINIHS 33* 414* 21,943* >27,000*      Chemistry Recent Labs  Lab 02/07/20 0127 02/08/20 0228  NA 140 135  K 3.7 4.3  CL 103 100  CO2 26 24  GLUCOSE 124* 123*  BUN 37* 43*  CREATININE 1.07 1.85*  CALCIUM 9.9 10.0  GFRNONAA >60 32*  GFRAA >60 37*  ANIONGAP 11 11     Hematology Recent Labs  Lab 02/07/20 0127 02/08/20 0228  WBC 7.7 11.1*  RBC 3.54* 3.52*  HGB 10.5* 9.9*  HCT 32.8* 32.1*  MCV 92.7 91.2  MCH 29.7 28.1  MCHC 32.0 30.8  RDW 16.0* 15.9*  PLT 235 253    BNP Recent Labs  Lab 02/07/20 1206  BNP 802.2*     DDimer No results for input(s): DDIMER in the last 168 hours.   Radiology    DG Chest 2 View  Result Date: 02/07/2020 CLINICAL DATA:  Chest pain EXAM: CHEST - 2 VIEW COMPARISON:  08/03/2019, 08/10/2019, 12/30/2017 FINDINGS: No significant pleural effusion. Cardiomegaly with mild central vascular congestion. Probable mild interstitial edema. Aortic atherosclerosis. No pneumothorax. IMPRESSION: Cardiomegaly with vascular congestion and probable mild interstitial  edema. Electronically Signed   By: Donavan Foil M.D.   On: 02/07/2020 02:34   ECHOCARDIOGRAM COMPLETE  Result Date: 02/07/2020    ECHOCARDIOGRAM REPORT   Patient Name:   Eduardo [name] Date of Exam: 02/07/2020 Medical Rec #:  [id number]          Height:       62.0 in Accession #:    [id number]         Weight:       138.9 lb Date of Birth:  1931-01-09         BSA:          1.637 m Patient Age:    86 years           BP:           130/79 mmHg Patient Gender: M                  HR:           75 bpm. Exam Location:  Inpatient Procedure: 2D Echo, Color Doppler and Cardiac Doppler Indications:    NSTEMI  History:        Patient has prior history of Echocardiogram examinations, most                 recent 08/12/2018. CHF, CAD, COPD, Arrythmias:Atrial Fibrillation;                 Risk Factors:Hypertension and Dyslipidemia.  Sonographer:    Raquel Sarna Senior RDCS Referring  Phys: [id number] [place]  1. Left ventricular ejection fraction, by estimation, is 30 to 35%. The left ventricle has moderately decreased function. The left ventricle demonstrates global hypokinesis. There is moderate left ventricular hypertrophy. Left ventricular diastolic parameters are consistent with Grade III diastolic dysfunction (restrictive).  2. Right ventricular systolic function is normal. The right ventricular size is normal. There is moderately elevated pulmonary artery systolic pressure. The estimated right ventricular systolic pressure is 52.8 mmHg.  3. Left atrial size was severely dilated.  4. Right atrial size was moderately dilated.  5. The mitral valve is normal in structure. Moderate mitral valve regurgitation. No evidence of mitral stenosis.  6. The aortic valve is tricuspid. Aortic valve regurgitation is not visualized. No aortic stenosis is present.  7. Aortic dilatation noted. There is mild dilatation of the ascending aorta measuring 37 mm.  8. The inferior vena cava is dilated in size with <50% respiratory variability, suggesting right atrial pressure of 15 mmHg. Comparison(s): Prior images reviewed side by side. Changes from prior study are noted. The left ventricular function is significantly worse. Prior ECHO EF WAS NORMAL. FINDINGS  Left Ventricle: Left ventricular ejection fraction, by estimation, is 30 to 35%. The left ventricle has moderately decreased function. The left ventricle demonstrates global hypokinesis. The left ventricular internal cavity size was normal in size. There is moderate left ventricular hypertrophy. Left ventricular diastolic parameters are consistent with Grade III diastolic dysfunction (restrictive).  LV Wall Scoring: The mid and distal lateral wall, mid and distal inferior wall, mid anterolateral segment, apical septal segment, and apex are akinetic. Right Ventricle: The right ventricular size is normal. No increase in right ventricular wall  thickness. Right ventricular systolic function is normal. There is moderately elevated pulmonary artery systolic pressure. The tricuspid regurgitant velocity is 3.14 m/s, and with an assumed right atrial pressure of 15 mmHg, the estimated right ventricular systolic pressure is 41.3 mmHg. Left Atrium: Left atrial size was severely dilated.  Right Atrium: Right atrial size was moderately dilated. Pericardium: There is no evidence of pericardial effusion. Mitral Valve: The mitral valve is normal in structure. There is severe thickening of the mitral valve leaflet(s). There is severe calcification of the mitral valve leaflet(s). Normal mobility of the mitral valve leaflets. Severe mitral annular calcification. Moderate mitral valve regurgitation. No evidence of mitral valve stenosis. Tricuspid Valve: The tricuspid valve is normal in structure. Tricuspid valve regurgitation is mild . No evidence of tricuspid stenosis. Aortic Valve: The aortic valve is tricuspid. . There is severe thickening and severe calcifcation of the aortic valve. Aortic valve regurgitation is not visualized. No aortic stenosis is present. There is severe thickening of the aortic valve. There is severe calcifcation of the aortic valve. Pulmonic Valve: The pulmonic valve was normal in structure. Pulmonic valve regurgitation is trivial. No evidence of pulmonic stenosis. Aorta: Aortic dilatation noted. There is mild dilatation of the ascending aorta measuring 37 mm. Venous: The inferior vena cava is dilated in size with less than 50% respiratory variability, suggesting right atrial pressure of 15 mmHg. IAS/Shunts: No atrial level shunt detected by color flow Doppler.  LEFT VENTRICLE PLAX 2D LVIDd:         5.00 cm      Diastology LVIDs:         3.70 cm      LV e' lateral:   9.81 cm/s LV PW:         1.70 cm      LV E/e' lateral: 13.4 LV IVS:        1.30 cm      LV e' medial:    3.73 cm/s LVOT diam:     1.80 cm      LV E/e' medial:  35.1 LV SV:         60  LV SV Index:   37 LVOT Area:     2.54 cm  LV Volumes (MOD) LV vol d, MOD A2C: 128.0 ml LV vol d, MOD A4C: 102.0 ml LV vol s, MOD A2C: 86.2 ml LV vol s, MOD A4C: 55.0 ml LV SV MOD A2C:     41.8 ml LV SV MOD A4C:     102.0 ml LV SV MOD BP:      46.0 ml RIGHT VENTRICLE RV S prime:     5.22 cm/s TAPSE (M-mode): 1.1 cm LEFT ATRIUM              Index       RIGHT ATRIUM           Index LA diam:        4.80 cm  2.93 cm/m  RA Area:     21.90 cm LA Vol (A2C):   108.0 ml 65.96 ml/m RA Volume:   63.40 ml  38.72 ml/m LA Vol (A4C):   110.0 ml 67.19 ml/m LA Biplane Vol: 114.0 ml 69.63 ml/m  AORTIC VALVE LVOT Vmax:   110.80 cm/s LVOT Vmean:  85.133 cm/s LVOT VTI:    0.236 m  AORTA Ao Root diam: 3.00 cm Ao Asc diam:  3.70 cm MITRAL VALVE                TRICUSPID VALVE MV Area (PHT): 4.57 cm     TR Peak grad:   39.4 mmHg MV Decel Time: 166 msec     TR Vmax:        314.00 cm/s MV E velocity: 131.00 cm/s  SHUNTS                             Systemic VTI:  0.24 m                             Systemic Diam: 1.80 cm Candee Furbish MD Electronically signed by Candee Furbish MD Signature Date/Time: 02/07/2020/4:37:24 PM    Final     Cardiac Studies   Echo 02/07/20 1. Left ventricular ejection fraction, by estimation, is 30 to 35%. The  left ventricle has moderately decreased function. The left ventricle  demonstrates global hypokinesis. There is moderate left ventricular  hypertrophy. Left ventricular diastolic  parameters are consistent with Grade III diastolic dysfunction  (restrictive).  2. Right ventricular systolic function is normal. The right ventricular  size is normal. There is moderately elevated pulmonary artery systolic  pressure. The estimated right ventricular systolic pressure is 74.1 mmHg.  3. Left atrial size was severely dilated.  4. Right atrial size was moderately dilated.  5. The mitral valve is normal in structure. Moderate mitral valve  regurgitation. No evidence of  mitral stenosis.  6. The aortic valve is tricuspid. Aortic valve regurgitation is not  visualized. No aortic stenosis is present.  7. Aortic dilatation noted. There is mild dilatation of the ascending  aorta measuring 37 mm.  8. The inferior vena cava is dilated in size with <50% respiratory  variability, suggesting right atrial pressure of 15 mmHg.   Patient Profile     86 y.o. male  with medical history of chronic diastolic heart failure, chronic afib, bilateral carotid artery disase, CAD, COPD, hyperlipidemia and HTN admitted with NSTEMI.   Assessment & Plan    1. NSTEMI - Troponin peaked > 27000. EKG with chronic LBBB. Plan cardiac cath today.   2. Chronic systolic CHF - LVEF of 28-78% by echo with grade II DD.  - BNP 802 - Given  IV lasix 40mg x 1 yesterday. However renal function worsen to 1.85 from 1.07. Now on fluids for hydration prior to cath. Will repeat renal function  3. AKI - SCr 1.85 - Repeat renal function around 10am - Continue lopressor >> consolidate to long active at discharge - Continue hydralazine >> change to ARB/Entresto once stable renal function and blood pressure however repots severe renal function after dye load x2  in 2012 when had inferior STEMI and required to see nephrologist. Will need close renal function.   4. HLD with hypertriglyceridemia  - 08/11/2019: Cholesterol 115; HDL 39.30; Triglycerides 1142.0 Triglyceride is over 400; calculations on Lipids are invalid.  - Continue Lipitor 80mg  qd - Reports cholesterol panel always runs higher   5. Chronic atrial fibrillation  - Eliquis on hold - On heparin  - Rete controlled on BB  6. CAD -S/p cath in 06/2010 revealing mod multivessel dzs, complicated by dissection of the ostial RCA w/ subsequent inf ST elevation requiring relook cath.  This has been medically managed  7. HTN - BP soft  8. Moderate MR  9. COPD - No active wheezing. Restart home inhaler   For questions or updates, please  contact [place] Please consult [email or web address] for contact info under        Signed, Leanor Kail, PA  02/08/2020, 7:47 AM    I have personally seen and examined this patient. I agree with the assessment and plan as outlined above. 86 yo male  with known CAD admitted with chest pain, volume overload and found to have reduced LV systolic function, troponin over 27,000. No chest pain today. EKG shows LBBB, chronic. Echo 02/07/20 with LVEF=30-35%, global hypokinesis, moderate MR. Given his presentation with chest pain, evidence of ACS and new finding of LV systolic dysfunction, cardiac cath is indicated. This will be planned based on stability of renal function. His creatinine bumped today after one dose of IV Lasix.Repeat BMET later this am. If renal function is improved, cardiac cath later today. If no improvement in renal function, will plan to delay cardiac cath until tomorrow. Continue IV heparin.   Lauree Chandler 02/08/2020 9:58 AM

## 2020-02-09 ENCOUNTER — Encounter (HOSPITAL_COMMUNITY): Payer: Self-pay | Admitting: Internal Medicine

## 2020-02-09 ENCOUNTER — Telehealth: Payer: Self-pay | Admitting: Internal Medicine

## 2020-02-09 ENCOUNTER — Encounter (HOSPITAL_COMMUNITY)
Admission: EM | Disposition: A | Discharge status: Skilled Nursing Facility | Payer: Self-pay | Source: Home / Self Care | Attending: Internal Medicine

## 2020-02-09 ENCOUNTER — Inpatient Hospital Stay (HOSPITAL_COMMUNITY): Payer: PPO

## 2020-02-09 LAB — BASIC METABOLIC PANEL
Anion gap: 11 (ref 5–15)
BUN: 66 mg/dL — ABNORMAL HIGH (ref 8–23)
CO2: 23 mmol/L (ref 22–32)
Calcium: 10.1 mg/dL (ref 8.9–10.3)
Chloride: 99 mmol/L (ref 98–111)
Creatinine, Ser: 2.31 mg/dL — ABNORMAL HIGH (ref 0.61–1.24)
GFR calc Af Amer: 29 mL/min — ABNORMAL LOW (ref >60)
GFR calc non Af Amer: 25 mL/min — ABNORMAL LOW (ref >60)
Glucose, Bld: 125 mg/dL — ABNORMAL HIGH (ref 70–99)
Potassium: 4.3 mmol/L (ref 3.5–5.1)
Sodium: 133 mmol/L — ABNORMAL LOW (ref 135–145)

## 2020-02-09 LAB — CBC
HCT: 33.3 % — ABNORMAL LOW (ref 39.0–52.0)
Hemoglobin: 10.6 g/dL — ABNORMAL LOW (ref 13.0–17.0)
MCH: 29.4 pg (ref 26.0–34.0)
MCHC: 31.8 g/dL (ref 30.0–36.0)
MCV: 92.2 fL (ref 80.0–100.0)
Platelets: 216 10*3/uL (ref 150–400)
RBC: 3.61 MIL/uL — ABNORMAL LOW (ref 4.22–5.81)
RDW: 15.8 % — ABNORMAL HIGH (ref 11.5–15.5)
WBC: 12.4 10*3/uL — ABNORMAL HIGH (ref 4.0–10.5)
nRBC: 0 % (ref 0.0–0.2)

## 2020-02-09 LAB — URINALYSIS, ROUTINE W REFLEX MICROSCOPIC
Bilirubin Urine: NEGATIVE
Glucose, UA: NEGATIVE mg/dL
Hgb urine dipstick: NEGATIVE
Ketones, ur: NEGATIVE mg/dL
Leukocytes,Ua: NEGATIVE
Nitrite: NEGATIVE
Protein, ur: NEGATIVE mg/dL
Specific Gravity, Urine: 1.015 (ref 1.005–1.030)
pH: 5 (ref 5.0–8.0)

## 2020-02-09 LAB — BRAIN NATRIURETIC PEPTIDE: B Natriuretic Peptide: 1855.9 pg/mL — ABNORMAL HIGH (ref 0.0–100.0)

## 2020-02-09 LAB — SODIUM, URINE, RANDOM: Sodium, Ur: <10 mmol/L

## 2020-02-09 LAB — HEPARIN LEVEL (UNFRACTIONATED): Heparin Unfractionated: 0.41 IU/mL (ref 0.30–0.70)

## 2020-02-09 SURGERY — LEFT HEART CATH AND CORONARY ANGIOGRAPHY
Anesthesia: LOCAL

## 2020-02-09 NOTE — Consult Note (Signed)
[facility] Renal Consultation Note  Requesting MD:   Indication for Consultation: Acute kidney injury, maintenance of euvolemia, assessment and treatment of electrolyte abnormalities, assessment treatment of acid-base abnormalities.     HPI:   Eduardo [name] is a 86 y.o. male.  With a history of coronary disease congestive heart failure COPD hyperlipidemia.  He presented with an NSTEMI.  He was scheduled for left-sided heart catheterization 02/09/2020 but appears to have a rising serum creatinine.  He had a serum creatinine increase after dye load in 2012.  There appears to be some systolic hypotension with a blood pressures that have been borderline 8/30 and 02/07/2020.  Lasix 40 mg given 02/07/2020.  IV nitroglycerin has been discontinued  Blood pressure 146/77 pulse 83 temperature 98.6 O2 sats 98% 2 L nasal cannula  Sodium 133 potassium 4.3 chloride 99 CO2 23 BUN 6 6 creatinine 2.3 calcium 10.1 hemoglobin 10.6 WBC 12.4 platelets 216  Brovana 2 puffs twice daily, aspirin 81 mg daily atorvastatin 80 mg daily vitamin D 2000 units daily, Lexapro 20 mg daily hydralazine 10 mg 3 times daily, metoprolol 25 mg twice daily multivitamins 1 daily Protonix 40 mg daily Umeclidinium 1 puff daily  IV heparin  Creatinine  Date/Time Value Ref Range Status  07/01/2017 12:18 PM 1.21 0.70 - 1.30 mg/dL Final  09/08/2011 09:56 AM 0.98 0.60 - 1.30 mg/dL Final   Creat  Date/Time Value Ref Range Status  08/22/2015 09:57 AM 1.01 0.70 - 1.11 mg/dL Final   Creatinine, Ser  Date/Time Value Ref Range Status  02/09/2020 03:30 AM 2.31 (H) 0.61 - 1.24 mg/dL Final  02/08/2020 11:40 AM 2.15 (H) 0.61 - 1.24 mg/dL Final  02/08/2020 02:28 AM 1.85 (H) 0.61 - 1.24 mg/dL Final  02/07/2020 01:27 AM 1.07 0.61 - 1.24 mg/dL Final  11/17/2019 04:44 PM 1.10 0.76 - 1.27 mg/dL Final  03/19/2019 04:05 PM 1.01 0.61 - 1.24 mg/dL Final  08/12/2018 01:29 PM 0.95 0.76 - 1.27 mg/dL Final  08/07/2018 03:49 PM 1.39  (H) 0.76 - 1.27 mg/dL Final  03/24/2018 04:31 PM 1.00 0.61 - 1.24 mg/dL Final  12/30/2017 12:41 PM 1.08 0.40 - 1.50 mg/dL Final  12/16/2017 03:42 PM 0.99 0.40 - 1.50 mg/dL Final  06/24/2017 05:09 PM 1.13 0.40 - 1.50 mg/dL Final  08/08/2016 08:47 AM 0.99 0.76 - 1.27 mg/dL Final  07/24/2016 11:44 AM 0.92 0.40 - 1.50 mg/dL Final  07/20/2016 05:00 AM 0.93 0.61 - 1.24 mg/dL Final  07/19/2016 04:23 AM 0.93 0.61 - 1.24 mg/dL Final  07/18/2016 04:30 AM 1.23 0.61 - 1.24 mg/dL Final  07/17/2016 04:05 PM 1.57 (H) 0.61 - 1.24 mg/dL Final  07/16/2016 02:22 AM 1.51 (H) 0.61 - 1.24 mg/dL Final  07/15/2016 06:36 AM 1.15 0.61 - 1.24 mg/dL Final  07/14/2016 02:10 AM 1.21 0.61 - 1.24 mg/dL Final  07/13/2016 11:13 PM 1.10 0.61 - 1.24 mg/dL Final  07/13/2016 10:54 PM 1.29 (H) 0.61 - 1.24 mg/dL Final  05/18/2016 08:10 AM 1.11 0.40 - 1.50 mg/dL Final  11/22/2015 09:04 AM 1.12 0.40 - 1.50 mg/dL Final  08/18/2015 02:20 AM 0.95 0.61 - 1.24 mg/dL Final  08/16/2015 04:08 AM 0.92 0.61 - 1.24 mg/dL Final  08/15/2015 04:57 AM 1.03 0.61 - 1.24 mg/dL Final  08/14/2015 10:00 AM 1.13 0.61 - 1.24 mg/dL Final  05/12/2015 08:06 AM 1.05 0.40 - 1.50 mg/dL Final  12/31/2014 12:16 AM 1.32 (H) 0.61 - 1.24 mg/dL Final  12/31/2014 12:12 AM 1.40 (H) 0.61 - 1.24 mg/dL Final  04/19/2014 05:00 PM  1.0 0.40 - 1.50 mg/dL Final  03/16/2013 11:15 AM 0.9 0.40 - 1.50 mg/dL Final  01/11/2012 12:44 PM 0.8 0.40 - 1.50 mg/dL Final  12/28/2011 06:30 AM 0.90 0.50 - 1.35 mg/dL Final  12/27/2011 05:00 AM 0.95 0.50 - 1.35 mg/dL Final  12/26/2011 05:55 AM 0.91 0.50 - 1.35 mg/dL Final  08/14/2011 12:29 PM 0.9 0.40 - 1.50 mg/dL Final  06/22/2010 04:57 AM 1.75 (H) 0.40 - 1.50 mg/dL Final  06/21/2010 04:44 AM 2.42 (H) 0.40 - 1.50 mg/dL Final  06/20/2010 03:40 AM 3.41 (H) 0.40 - 1.50 mg/dL Final  06/19/2010 03:30 AM 4.76 (H) 0.40 - 1.50 mg/dL Final  06/18/2010 04:45 AM 4.35 (H) 0.40 - 1.50 mg/dL Final  06/17/2010 09:01 AM 2.92 (H) 0.40 - 1.50 mg/dL  Final  06/17/2010 05:18 AM 3.15 DELTA CHECK NOTED (H) 0.40 - 1.50 mg/dL Final  06/16/2010 05:15 AM 1.11 0.40 - 1.50 mg/dL Final  12/06/2009 09:35 AM 1.0 0.4 - 1.5 mg/dL Final  05/31/2009 11:13 AM 1.1 0.4 - 1.5 mg/dL Final  11/16/2008 08:44 AM 1.1 0.4 - 1.5 mg/dL Final  04/14/2008 12:31 PM 1.0 0.4 - 1.5 mg/dL Final     PMHx:   Past Medical History:  Diagnosis Date  . Allergy   . Anemia 2012, 08/2015   chronic. Acute due to large hematoma 2013. 2017: due to gastric AVM bleeding.   . Anxiety   . CAD (coronary artery disease)    a. 06/2010 Cath: LM nl, LAD 50p/m, LCX 22m, RCA 50p/m -->catheter dissection but good flow, 70d, RPDA 50-70;  b. 06/2010 Relook cath due to bradycardia and inf ST elev: RCA dissection @ ostium extending into coronary cusp and prox RCA, Ao dissection-->less staining than previously-->Med Rx; c. 08/2018 MV: No ischemia.  . Chronic combined systolic (congestive) and diastolic (congestive) heart failure ([place])    a. 12/2014 Echo: EF 60-65%; b. 07/2016 Echo: EF 45-50%, mild LVH. Mild AS/MS. Mod-sev MR. Mild to mod TR. PASP 3mmHg; c. 08/2018 Echo: EF 55-60%, RVSP 68.55mmHg. Sev dil LA, mod dil RA. at least mod MR, mild to mod TR. Mild AS.  Marland Kitchen COPD (chronic obstructive pulmonary disease) ([place]) 2013   exertional dyspnea  . Gastric angiodysplasia with hemorrhage 08/2015  . Hx of colonic polyps 2001, 2011   adenomatous 2001. HP 2001, 2011.  Marland Kitchen Hyperlipidemia   . Mitral regurgitation    a. 07/2016 Echo: Mod to sev MR; b. 08/2018 Echo: at least moderate MR.  . Myocardial infarction ([place]) 06/2010.  12/2011.   "twice; 1 day apart; during/after cath". NQMI in setting severe anemia 2013.   Marland Kitchen Neuropathy 2014   in feet, likely due to spinal stenosis, DDD, HNP  . Osteoarthritis 2002   spinal stenosis, spondylolisthesis, disc protrusion multilevel in lumbar spine  . Permanent atrial fibrillation ([place]) 2012   a.  CHA2DS2VASc = 6-->eliquis;  b. 12/2015 Echo: EF 60-65%, no rwma, LVH, mild  AS/MS/MR, sev dil LA, mild TR, PASP 28mmHg.  . Stroke ([place]) 2016  . Upper GI bleed 08/2015   EGD: 2 small gastric AVMs, 1 actively bleeding.  Both treated with APC ablation, clipping of the bleeder    Past Surgical History:  Procedure Laterality Date  . APPENDECTOMY    . CARDIAC CATHETERIZATION  2012   50% LAD and luminal irreg in RCA, 1/12  Post cath MI from damage  . CARPAL TUNNEL RELEASE     left  . CARPAL TUNNEL RELEASE  10/11/11   Dr Rush Barer  . CATARACT EXTRACTION  W/ INTRAOCULAR LENS IMPLANT  2/13   Dr [name]  . COLONOSCOPY  2001, 02/2010   Dr Deatra Ina.   . ESOPHAGOGASTRODUODENOSCOPY N/A 08/15/2015   [name] MD; 2 small gastric AVMs, 1 actively bleeding.  Both treated with APC ablation, clipping of the bleeder  . FOOT FRACTURE SURGERY     right heel repair  . FRACTURE SURGERY    . KNEE ARTHROSCOPY     left, right knee 10/2009  . MASTOIDECTOMY     x3 on right  . NASAL SEPTUM SURGERY     nasal septal repair  . PENILE PROSTHESIS  REMOVAL    . PENILE PROSTHESIS IMPLANT     x 2--got infection after 2nd and had to remove  . PILONIDAL CYST / SINUS EXCISION    . SHOULDER ARTHROSCOPY     right  . TONSILLECTOMY AND ADENOIDECTOMY      Family Hx:  Family History  Problem Relation Age of Onset  . Cancer Mother        ? leukemia  . Heart attack Father   . Stroke Maternal Grandmother   . Diabetes Neg Hx   . Hypertension Neg Hx     Social History:  reports that he quit smoking about 41 years ago. His smoking use included cigarettes. He has a 75.00 pack-year smoking history. He has never used smokeless tobacco. He reports that he does not drink alcohol and does not use drugs.  Allergies:  Allergies  Allergen Reactions  . Penicillins Itching    Has patient had a PCN reaction causing immediate rash, facial/tongue/throat swelling, SOB or lightheadedness with hypotension: Yes Has patient had a PCN reaction causing severe rash involving mucus membranes or skin  necrosis: No Has patient had a PCN reaction that required hospitalization No Has patient had a PCN reaction occurring within the last 10 years: No If all of the above answers are "NO", then may proceed with Cephalosporin use.   . Sulfonamide Derivatives Other (See Comments)    "it affected my kidneys"  . Tape Other (See Comments)    Arms black and blue, tears skin.  Please use "paper" tape    Medications: Prior to Admission medications   Medication Sig Start Date End Date Taking? Authorizing Provider  albuterol (PROVENTIL) (2.5 MG/3ML) 0.083% nebulizer solution Use 1 vial in nebulizer every 4 hours as needed for wheezing or shortness of breath 02/09/19  Yes Byrum, Rose Fillers, MD  ALPRAZolam Duanne Moron) 0.25 MG tablet Take 1 tablet (0.25 mg total) by mouth 2 (two) times daily as needed for anxiety. 12/11/17  Yes Venia Carbon, MD  atorvastatin (LIPITOR) 80 MG tablet Take 1 tablet (80 mg total) by mouth daily. 07/03/19  Yes Gollan, Kathlene November, MD  B Complex-C (SUPER B COMPLEX PO) Take 1 tablet by mouth at bedtime.    Yes [provider]  cetirizine (ZYRTEC) 10 MG tablet Take 10 mg by mouth at bedtime.    Yes [provider]  Cholecalciferol (VITAMIN D) 50 MCG (2000 UT) CAPS Take 2,000 Units by mouth daily.    Yes [provider]  Coenzyme Q10 (CVS COQ-10 PO) Take 300 mg by mouth daily.    Yes [provider]  ELIQUIS 5 MG TABS tablet TAKE ONE TABLET BY MOUTH TWICE DAILY  Patient taking differently: Take 5 mg by mouth 2 (two) times daily.  09/11/19  Yes Gollan, Kathlene November, MD  escitalopram (LEXAPRO) 20 MG tablet Take 20 mg by mouth at bedtime.  Yes [provider]  fenofibrate 160 MG tablet TAKE ONE TABLET BY MOUTH DAILY AT BEDTIME  Patient taking differently: Take 160 mg by mouth at bedtime.  03/27/19  Yes Venia Carbon, MD  furosemide (LASIX) 40 MG tablet TAKE ONE TABLET BY MOUTH ONE TIME DAILY Patient taking differently: Take 40 mg by mouth daily.   02/05/20  Yes [name] I, MD  hydrALAZINE (APRESOLINE) 10 MG tablet TAKE ONE TABLET BY MOUTH THREE TIMES DAILY  Patient taking differently: Take 10 mg by mouth 3 (three) times daily.  10/16/19  Yes Minna Merritts, MD  ipratropium (ATROVENT) 0.03 % nasal spray INSTILL Two sprays into both nostrils at bedtime Patient taking differently: Place 2 sprays into both nostrils at bedtime. INSTILL TWO SPRAYS INTO BOTH NOSTRILS AT BEDTIME 11/06/19  Yes Collene Gobble, MD  lamoTRIgine (LAMICTAL) 150 MG tablet Take 150 mg by mouth at bedtime. Reported on 08/14/2015 07/07/15  Yes [provider]  montelukast (SINGULAIR) 10 MG tablet TAKE ONE TABLET BY MOUTH DAILY AT BEDTIME  Patient taking differently: Take 10 mg by mouth at bedtime.  09/07/19  Yes Collene Gobble, MD  Multiple Vitamin (MULTIVITAMIN) tablet Take 1 tablet by mouth at bedtime.    Yes [provider]  nitroGLYCERIN (NITROSTAT) 0.4 MG SL tablet Place 1 tablet (0.4 mg total) under the tongue every 5 (five) minutes as needed for chest pain. 08/19/18 02/07/20 Yes Theora Gianotti, NP  pantoprazole (PROTONIX) 40 MG tablet TAKE 1 TABLET BY MOUTH DAILY BEFORE BREAKFAST Patient taking differently: Take 40 mg by mouth daily. TAKE 1 TABLET BY MOUTH DAILY BEFORE BREAKFAST. 02/05/20  Yes Venia Carbon, MD  STIOLTO RESPIMAT 2.5-2.5 MCG/ACT AERS inhale 2 puffs by mouth into the lungs once daily Patient taking differently: Inhale 2 puffs into the lungs daily.  09/15/19  Yes Collene Gobble, MD  triamcinolone cream (KENALOG) 0.1 % Apply 1 application topically 2 (two) times daily as needed (for skin).  08/10/15  Yes [provider]  verapamil (CALAN-SR) 240 MG CR tablet TAKE ONE TABLET BY MOUTH DAILY AT BEDTIME Patient taking differently: Take 240 mg by mouth daily.  11/27/19  Yes Minna Merritts, MD       [provider]      Labs:  Results for orders placed or performed during the hospital encounter of 02/07/20  (from the past 48 hour(s))  Troponin I (High Sensitivity)     Status: Abnormal   Collection Time: 02/07/20 12:06 PM  Result Value Ref Range   Troponin I (High Sensitivity) 21,943 (HH) <18 ng/L    Comment: CRITICAL VALUE NOTED.  VALUE IS CONSISTENT WITH PREVIOUSLY REPORTED AND CALLED VALUE. (NOTE) Elevated high sensitivity troponin I (hsTnI) values and significant  changes across serial measurements may suggest ACS but many other  chronic and acute conditions are known to elevate hsTnI results.  Refer to the Links section for chest pain algorithms and additional  guidance. Performed at [facility], [place] [address], [place], [place] [address]   Brain natriuretic peptide     Status: Abnormal   Collection Time: 02/07/20 12:06 PM  Result Value Ref Range   B Natriuretic Peptide 802.2 (H) 0.0 - 100.0 pg/mL    Comment: Performed at [facility] [address], [place], [place] [address]  Heparin level (unfractionated)     Status: Abnormal   Collection Time: 02/07/20  1:15 PM  Result Value Ref Range   Heparin Unfractionated  0.27 (L) 0.30 - 0.70 IU/mL    Comment: (NOTE) If heparin results are below expected values, and patient dosage has  been confirmed, suggest follow up testing of antithrombin III levels. Performed at [facility], [place] [address], [place], [place] [address]   APTT     Status: Abnormal   Collection Time: 02/07/20  1:15 PM  Result Value Ref Range   aPTT 63 (H) 24 - 36 seconds    Comment:        IF BASELINE aPTT IS ELEVATED, SUGGEST PATIENT RISK ASSESSMENT BE USED TO DETERMINE APPROPRIATE ANTICOAGULANT THERAPY. Performed at [facility], [place] [address] [place], [place] [address]   Troponin I (High Sensitivity)     Status: Abnormal   Collection Time: 02/07/20  3:35 PM  Result Value Ref Range   Troponin I (High Sensitivity) >27,000 (HH) <18 ng/L    Comment: RESULT CONFIRMED BY AUTOMATED DILUTION CRITICAL VALUE NOTED.  VALUE IS CONSISTENT  WITH PREVIOUSLY REPORTED AND CALLED VALUE. (NOTE) Elevated high sensitivity troponin I (hsTnI) values and significant  changes across serial measurements may suggest ACS but many other  chronic and acute conditions are known to elevate hsTnI results.  Refer to the Links section for chest pain algorithms and additional  guidance. Performed at [facility], [place] [address], [place], [place] [address]   Basic metabolic panel     Status: Abnormal   Collection Time: 02/08/20  2:28 AM  Result Value Ref Range   Sodium 135 135 - 145 mmol/L   Potassium 4.3 3.5 - 5.1 mmol/L   Chloride 100 98 - 111 mmol/L   CO2 24 22 - 32 mmol/L   Glucose, Bld 123 (H) 70 - 99 mg/dL    Comment: Glucose reference range applies only to samples taken after fasting for at least 8 hours.   BUN 43 (H) 8 - 23 mg/dL   Creatinine, Ser 1.85 (H) 0.61 - 1.24 mg/dL   Calcium 10.0 8.9 - 10.3 mg/dL   GFR calc non Af Amer 32 (L) >60 mL/min   GFR calc Af Amer 37 (L) >60 mL/min   Anion gap 11 5 - 15    Comment: Performed at [facility] [address], [place], [place] [address]  CBC     Status: Abnormal   Collection Time: 02/08/20  2:28 AM  Result Value Ref Range   WBC 11.1 (H) 4.0 - 10.5 K/uL   RBC 3.52 (L) 4.22 - 5.81 MIL/uL   Hemoglobin 9.9 (L) 13.0 - 17.0 g/dL   HCT 32.1 (L) 39 - 52 %   MCV 91.2 80.0 - 100.0 fL   MCH 28.1 26.0 - 34.0 pg   MCHC 30.8 30.0 - 36.0 g/dL   RDW 15.9 (H) 11.5 - 15.5 %   Platelets 253 150 - 400 K/uL   nRBC 0.0 0.0 - 0.2 %    Comment: Performed at [facility], [place] [address] [place], [place] [address]  Heparin level (unfractionated)     Status: None   Collection Time: 02/08/20  2:28 AM  Result Value Ref Range   Heparin Unfractionated 0.45 0.30 - 0.70 IU/mL    Comment: (NOTE) If heparin results are below expected values, and patient dosage has  been confirmed, suggest follow up testing of antithrombin III levels. Performed at [facility], [place] [address]  [place], [place] [address]   Basic metabolic panel     Status: Abnormal  Collection Time: 02/08/20 11:40 AM  Result Value Ref Range   Sodium 134 (L) 135 - 145 mmol/L   Potassium 4.3 3.5 - 5.1 mmol/L   Chloride 100 98 - 111 mmol/L   CO2 23 22 - 32 mmol/L   Glucose, Bld 99 70 - 99 mg/dL    Comment: Glucose reference range applies only to samples taken after fasting for at least 8 hours.   BUN 50 (H) 8 - 23 mg/dL   Creatinine, Ser 2.15 (H) 0.61 - 1.24 mg/dL   Calcium 10.1 8.9 - 10.3 mg/dL   GFR calc non Af Amer 27 (L) >60 mL/min   GFR calc Af Amer 31 (L) >60 mL/min   Anion gap 11 5 - 15    Comment: Performed at [facility] [address], [place], [place] [address]  Basic metabolic panel     Status: Abnormal   Collection Time: 02/09/20  3:30 AM  Result Value Ref Range   Sodium 133 (L) 135 - 145 mmol/L   Potassium 4.3 3.5 - 5.1 mmol/L   Chloride 99 98 - 111 mmol/L   CO2 23 22 - 32 mmol/L   Glucose, Bld 125 (H) 70 - 99 mg/dL    Comment: Glucose reference range applies only to samples taken after fasting for at least 8 hours.   BUN 66 (H) 8 - 23 mg/dL   Creatinine, Ser 2.31 (H) 0.61 - 1.24 mg/dL   Calcium 10.1 8.9 - 10.3 mg/dL   GFR calc non Af Amer 25 (L) >60 mL/min   GFR calc Af Amer 29 (L) >60 mL/min   Anion gap 11 5 - 15    Comment: Performed at [facility] [address], [place], [place] [address]  CBC     Status: Abnormal   Collection Time: 02/09/20  3:30 AM  Result Value Ref Range   WBC 12.4 (H) 4.0 - 10.5 K/uL   RBC 3.61 (L) 4.22 - 5.81 MIL/uL   Hemoglobin 10.6 (L) 13.0 - 17.0 g/dL   HCT 33.3 (L) 39 - 52 %   MCV 92.2 80.0 - 100.0 fL   MCH 29.4 26.0 - 34.0 pg   MCHC 31.8 30.0 - 36.0 g/dL   RDW 15.8 (H) 11.5 - 15.5 %   Platelets 216 150 - 400 K/uL   nRBC 0.0 0.0 - 0.2 %    Comment: Performed at [facility] [address], [place], [place] [address]  Heparin level (unfractionated)     Status: None   Collection Time: 02/09/20  3:30 AM  Result Value  Ref Range   Heparin Unfractionated 0.41 0.30 - 0.70 IU/mL    Comment: (NOTE) If heparin results are below expected values, and patient dosage has  been confirmed, suggest follow up testing of antithrombin III levels. Performed at [facility], [place] [address] [place], [place] [address]      ROS:  General denies fatigue fever sweats or chills Eyes denies visual complaints Ear nose mouth throat no hearing loss epistaxis or sore throat Cardiovascular positive for NSTEMI plan for cardiac catheterization once renal function improves Respiratory denies cough wheeze and obsess Abdominal system denies abdominal pain nausea vomiting Urogenital no urgency frequency dysuria Neurologic no stroke no seizures no focal deficits Musculoskeletal no use nonsteroidal anti-inflammatory drugs no joint swelling Dermatologic no skin rash  Physical Exam: Vitals:   02/09/20 0015 02/09/20 0601  BP: 138/82 (!) 146/77  Pulse: 89 79  Resp: 18 (!)  22  Temp: 98.8 F (37.1 C) 98.6 F (37 C)  SpO2: 100% 98%     General: Elderly gentleman lying supine in bed HEENT: Normocephalic atraumatic oropharynx was clear Eyes: Pupils round equal reactive no icterus or pallor Neck: Supple no thyromegaly no JVP no carotid bruits Heart: Regular rate and rhythm no murmurs rubs or gallops Lungs: Clear to auscultation bilaterally no wheezes rales Abdomen: Soft nontender no guarding splenomegaly Extremities: No cyanosis clubbing or edema Skin: No lesions noted Neuro: No focality  Assessment/Plan: 1.Acute kidney injury with baseline serum creatinine within normal range.  Noted hypotension with systolic blood pressures less than 100 mmHg 02/07/2020 02/08/2020.  I suspect this is the etiology of his acute tubular necrosis.  Will check renal ultrasound and evaluate urinalysis.  Agree with delaying contrast.  Daily renal panel monitor I's and O's.  Adjust medications for worsening renal insufficiency.  Avoid the use of  nephrotoxins 2. Hypertension/volume  -appears to be stable euvolemic did receive IV Lasix.  Good urine output 3.  Anemia we will continue to follow stable at this point no intervention needed 4.  Coronary artery disease status post NSTEMI.  Plan is for cardiac catheterization once creatinine stable.  Avoid hypotension if possible.  IV heparin. 5.  Bones acute kidney injury Baseline creatinine within normal range.  We will continue to follow 6.  GERD patient using Protonix I seriously doubt this is causing any acute interstitial nephritis.  But will continue to Barrett in mind.   Sherril Croon 02/09/2020, 8:13 AM

## 2020-02-09 NOTE — Progress Notes (Signed)
[facility] for Heparin (Apixaban on hold) Indication: chest pain/ACS and atrial fibrillation  Allergies  Allergen Reactions  . Penicillins Itching    Has patient had a PCN reaction causing immediate rash, facial/tongue/throat swelling, SOB or lightheadedness with hypotension: Yes Has patient had a PCN reaction causing severe rash involving mucus membranes or skin necrosis: No Has patient had a PCN reaction that required hospitalization No Has patient had a PCN reaction occurring within the last 10 years: No If all of the above answers are "NO", then may proceed with Cephalosporin use.   . Sulfonamide Derivatives Other (See Comments)    "it affected my kidneys"  . Tape Other (See Comments)    Arms black and blue, tears skin.  Please use "paper" tape     Vital Signs: Temp: 98.6 F (37 C) (08/31 0601) Temp Source: Oral (08/31 0601) BP: 146/77 (08/31 0601) Pulse Rate: 79 (08/31 0601)  Labs: Recent Labs    02/07/20 0127 02/07/20 0127 02/07/20 0333 02/07/20 1206 02/07/20 1315 02/07/20 1535 02/08/20 0228 02/08/20 1140 02/09/20 0330  HGB 10.5*   < >  --   --   --   --  9.9*  --  10.6*  HCT 32.8*  --   --   --   --   --  32.1*  --  33.3*  PLT 235  --   --   --   --   --  253  --  216  APTT  --   --   --   --  63*  --   --   --   --   HEPARINUNFRC  --   --   --   --  0.27*  --  0.45  --  0.41  CREATININE 1.07   < >  --   --   --   --  1.85* 2.15* 2.31*  TROPONINIHS 33*   < > 414* 21,943*  --  >27,000*  --   --   --    < > = values in this interval not displayed.    Estimated Creatinine Clearance: 19.2 mL/min (A) (by C-G formula based on SCr of 2.31 mg/dL (H)).   Medical History: Past Medical History:  Diagnosis Date  . Allergy   . Anemia 2012, 08/2015   chronic. Acute due to large hematoma 2013. 2017: due to gastric AVM bleeding.   . Anxiety   . CAD (coronary artery disease)    a. 06/2010 Cath: LM nl, LAD 50p/m, LCX 92m, RCA 50p/m  -->catheter dissection but good flow, 70d, RPDA 50-70;  b. 06/2010 Relook cath due to bradycardia and inf ST elev: RCA dissection @ ostium extending into coronary cusp and prox RCA, Ao dissection-->less staining than previously-->Med Rx; c. 08/2018 MV: No ischemia.  . Chronic combined systolic (congestive) and diastolic (congestive) heart failure ([place])    a. 12/2014 Echo: EF 60-65%; b. 07/2016 Echo: EF 45-50%, mild LVH. Mild AS/MS. Mod-sev MR. Mild to mod TR. PASP 69mmHg; c. 08/2018 Echo: EF 55-60%, RVSP 68.17mmHg. Sev dil LA, mod dil RA. at least mod MR, mild to mod TR. Mild AS.  Marland Kitchen COPD (chronic obstructive pulmonary disease) ([place]) 2013   exertional dyspnea  . Gastric angiodysplasia with hemorrhage 08/2015  . Hx of colonic polyps 2001, 2011   adenomatous 2001. HP 2001, 2011.  Marland Kitchen Hyperlipidemia   . Mitral regurgitation    a. 07/2016 Echo: Mod to sev MR; b. 08/2018 Echo: at least  moderate MR.  . Myocardial infarction ([place]) 06/2010.  12/2011.   "twice; 1 day apart; during/after cath". NQMI in setting severe anemia 2013.   Marland Kitchen Neuropathy 2014   in feet, likely due to spinal stenosis, DDD, HNP  . Osteoarthritis 2002   spinal stenosis, spondylolisthesis, disc protrusion multilevel in lumbar spine  . Permanent atrial fibrillation ([place]) 2012   a.  CHA2DS2VASc = 6-->eliquis;  b. 12/2015 Echo: EF 60-65%, no rwma, LVH, mild AS/MS/MR, sev dil LA, mild TR, PASP 50mmHg.  . Stroke ([place]) 2016  . Upper GI bleed 08/2015   EGD: 2 small gastric AVMs, 1 actively bleeding.  Both treated with APC ablation, clipping of the bleeder    Assessment: 20 yoM on apixaban PTA for AFib admitted with CP and started on IV heparin for cath lab. Apixaban previously held at home for procedure.  Heparin level therapeutic this morning, CBC stable.  Goal of Therapy:  Heparin level 0.3-0.7 units/mL Monitor platelets by anticoagulation protocol: Yes   Plan:  -Continue heparin 950 units/h -Daily heparin level and CBC   Arrie Senate,  PharmD, BCPS Clinical Pharmacist [phone] Please check AMION for all [facility] [place] numbers 02/09/2020

## 2020-02-09 NOTE — Telephone Encounter (Signed)
Patient's wife,Tina, said she received a call from our office and missed it. She was wondering if it was Dr.Letvak.  I let her know it could have been, but Dr.Letvak is gone for the day.  She said if he wants to call her, she can be reached on her cell phone.

## 2020-02-09 NOTE — Progress Notes (Addendum)
Progress Note  Patient Name: Eduardo [name] Date of Encounter: 02/09/2020  [facility] Cardiologist: Ida Rogue, MD   Subjective   Severe shortness of breath with walking. Sleeping in reclining position.   Inpatient Medications    Scheduled Meds: . arformoterol  15 mcg Nebulization BID  . aspirin EC  81 mg Oral Daily  . atorvastatin  80 mg Oral Daily  . cholecalciferol  2,000 Units Oral Daily  . escitalopram  20 mg Oral QHS  . hydrALAZINE  10 mg Oral TID  . lamoTRIgine  150 mg Oral QHS  . metoprolol tartrate  25 mg Oral BID  . multivitamin with minerals  1 tablet Oral QHS  . pantoprazole  40 mg Oral Daily  . sodium chloride flush  3 mL Intravenous Q12H  . umeclidinium bromide  1 puff Inhalation Daily   Continuous Infusions: . sodium chloride    . heparin 950 Units/hr (02/08/20 1014)  . nitroGLYCERIN 25 mcg/min (02/08/20 0130)   PRN Meds: sodium chloride, acetaminophen, albuterol, nitroGLYCERIN, ondansetron (ZOFRAN) IV, polyethylene glycol, sodium chloride flush   Vital Signs    Vitals:   02/08/20 2041 02/08/20 2115 02/09/20 0015 02/09/20 0601  BP:  133/82 138/82 (!) 146/77  Pulse:  87 89 79  Resp:  18 18 (!) 22  Temp:  98.3 F (36.8 C) 98.8 F (37.1 C) 98.6 F (37 C)  TempSrc:  Oral Oral Oral  SpO2: 96% 98% 100% 98%  Weight:    66 kg  Height:        Intake/Output Summary (Last 24 hours) at 02/09/2020 0756 Last data filed at 02/09/2020 0630 Gross per 24 hour  Intake 732.4 ml  Output 400 ml  Net 332.4 ml   Last 3 Weights 02/09/2020 02/08/2020 02/07/2020  Weight (lbs) 145 lb 6.4 oz 138 lb 14.2 oz 138 lb 14.2 oz  Weight (kg) 65.953 kg 63 kg 63 kg      Telemetry    AFib - Personally Reviewed  ECG    No new tracing   Physical Exam   GEN: No acute distress.   Neck: No JVD Cardiac: RRR, no murmurs, rubs, or gallops.  Respiratory: faint rales GI: Soft, nontender, non-distended  MS: No edema; No deformity. Neuro:  Nonfocal  Psych: Normal  affect   Labs    High Sensitivity Troponin:   Recent Labs  Lab 02/07/20 0127 02/07/20 0333 02/07/20 1206 02/07/20 1535  TROPONINIHS 33* 414* 21,943* >27,000*      Chemistry Recent Labs  Lab 02/08/20 0228 02/08/20 1140 02/09/20 0330  NA 135 134* 133*  K 4.3 4.3 4.3  CL 100 100 99  CO2 24 23 23   GLUCOSE 123* 99 125*  BUN 43* 50* 66*  CREATININE 1.85* 2.15* 2.31*  CALCIUM 10.0 10.1 10.1  GFRNONAA 32* 27* 25*  GFRAA 37* 31* 29*  ANIONGAP 11 11 11      Hematology Recent Labs  Lab 02/07/20 0127 02/08/20 0228 02/09/20 0330  WBC 7.7 11.1* 12.4*  RBC 3.54* 3.52* 3.61*  HGB 10.5* 9.9* 10.6*  HCT 32.8* 32.1* 33.3*  MCV 92.7 91.2 92.2  MCH 29.7 28.1 29.4  MCHC 32.0 30.8 31.8  RDW 16.0* 15.9* 15.8*  PLT 235 253 216    BNP Recent Labs  Lab 02/07/20 1206  BNP 802.2*     Radiology    ECHOCARDIOGRAM COMPLETE  Result Date: 02/07/2020    ECHOCARDIOGRAM REPORT   Patient Name:   Eduardo [name] Date of Exam: 02/07/2020 Medical  Rec #:  [id number]          Height:       62.0 in Accession #:    [id number]         Weight:       138.9 lb Date of Birth:  16-Jul-1930         BSA:          1.637 m Patient Age:    86 years           BP:           130/79 mmHg Patient Gender: M                  HR:           75 bpm. Exam Location:  Inpatient Procedure: 2D Echo, Color Doppler and Cardiac Doppler Indications:    NSTEMI  History:        Patient has prior history of Echocardiogram examinations, most                 recent 08/12/2018. CHF, CAD, COPD, Arrythmias:Atrial Fibrillation;                 Risk Factors:Hypertension and Dyslipidemia.  Sonographer:    Raquel Sarna Senior RDCS Referring Phys: [id number] [place]  1. Left ventricular ejection fraction, by estimation, is 30 to 35%. The left ventricle has moderately decreased function. The left ventricle demonstrates global hypokinesis. There is moderate left ventricular hypertrophy. Left ventricular diastolic parameters are  consistent with Grade III diastolic dysfunction (restrictive).  2. Right ventricular systolic function is normal. The right ventricular size is normal. There is moderately elevated pulmonary artery systolic pressure. The estimated right ventricular systolic pressure is 65.4 mmHg.  3. Left atrial size was severely dilated.  4. Right atrial size was moderately dilated.  5. The mitral valve is normal in structure. Moderate mitral valve regurgitation. No evidence of mitral stenosis.  6. The aortic valve is tricuspid. Aortic valve regurgitation is not visualized. No aortic stenosis is present.  7. Aortic dilatation noted. There is mild dilatation of the ascending aorta measuring 37 mm.  8. The inferior vena cava is dilated in size with <50% respiratory variability, suggesting right atrial pressure of 15 mmHg. Comparison(s): Prior images reviewed side by side. Changes from prior study are noted. The left ventricular function is significantly worse. Prior ECHO EF WAS NORMAL. FINDINGS  Left Ventricle: Left ventricular ejection fraction, by estimation, is 30 to 35%. The left ventricle has moderately decreased function. The left ventricle demonstrates global hypokinesis. The left ventricular internal cavity size was normal in size. There is moderate left ventricular hypertrophy. Left ventricular diastolic parameters are consistent with Grade III diastolic dysfunction (restrictive).  LV Wall Scoring: The mid and distal lateral wall, mid and distal inferior wall, mid anterolateral segment, apical septal segment, and apex are akinetic. Right Ventricle: The right ventricular size is normal. No increase in right ventricular wall thickness. Right ventricular systolic function is normal. There is moderately elevated pulmonary artery systolic pressure. The tricuspid regurgitant velocity is 3.14 m/s, and with an assumed right atrial pressure of 15 mmHg, the estimated right ventricular systolic pressure is 65.0 mmHg. Left Atrium: Left  atrial size was severely dilated. Right Atrium: Right atrial size was moderately dilated. Pericardium: There is no evidence of pericardial effusion. Mitral Valve: The mitral valve is normal in structure. There is severe thickening of the mitral valve leaflet(s). There is severe calcification of the mitral valve  leaflet(s). Normal mobility of the mitral valve leaflets. Severe mitral annular calcification. Moderate mitral valve regurgitation. No evidence of mitral valve stenosis. Tricuspid Valve: The tricuspid valve is normal in structure. Tricuspid valve regurgitation is mild . No evidence of tricuspid stenosis. Aortic Valve: The aortic valve is tricuspid. . There is severe thickening and severe calcifcation of the aortic valve. Aortic valve regurgitation is not visualized. No aortic stenosis is present. There is severe thickening of the aortic valve. There is severe calcifcation of the aortic valve. Pulmonic Valve: The pulmonic valve was normal in structure. Pulmonic valve regurgitation is trivial. No evidence of pulmonic stenosis. Aorta: Aortic dilatation noted. There is mild dilatation of the ascending aorta measuring 37 mm. Venous: The inferior vena cava is dilated in size with less than 50% respiratory variability, suggesting right atrial pressure of 15 mmHg. IAS/Shunts: No atrial level shunt detected by color flow Doppler.  LEFT VENTRICLE PLAX 2D LVIDd:         5.00 cm      Diastology LVIDs:         3.70 cm      LV e' lateral:   9.81 cm/s LV PW:         1.70 cm      LV E/e' lateral: 13.4 LV IVS:        1.30 cm      LV e' medial:    3.73 cm/s LVOT diam:     1.80 cm      LV E/e' medial:  35.1 LV SV:         60 LV SV Index:   37 LVOT Area:     2.54 cm  LV Volumes (MOD) LV vol d, MOD A2C: 128.0 ml LV vol d, MOD A4C: 102.0 ml LV vol s, MOD A2C: 86.2 ml LV vol s, MOD A4C: 55.0 ml LV SV MOD A2C:     41.8 ml LV SV MOD A4C:     102.0 ml LV SV MOD BP:      46.0 ml RIGHT VENTRICLE RV S prime:     5.22 cm/s TAPSE  (M-mode): 1.1 cm LEFT ATRIUM              Index       RIGHT ATRIUM           Index LA diam:        4.80 cm  2.93 cm/m  RA Area:     21.90 cm LA Vol (A2C):   108.0 ml 65.96 ml/m RA Volume:   63.40 ml  38.72 ml/m LA Vol (A4C):   110.0 ml 67.19 ml/m LA Biplane Vol: 114.0 ml 69.63 ml/m  AORTIC VALVE LVOT Vmax:   110.80 cm/s LVOT Vmean:  85.133 cm/s LVOT VTI:    0.236 m  AORTA Ao Root diam: 3.00 cm Ao Asc diam:  3.70 cm MITRAL VALVE                TRICUSPID VALVE MV Area (PHT): 4.57 cm     TR Peak grad:   39.4 mmHg MV Decel Time: 166 msec     TR Vmax:        314.00 cm/s MV E velocity: 131.00 cm/s                             SHUNTS  Systemic VTI:  0.24 m                             Systemic Diam: 1.80 cm Candee Furbish MD Electronically signed by Candee Furbish MD Signature Date/Time: 02/07/2020/4:37:24 PM    Final     Cardiac Studies   Echo 02/07/20 1. Left ventricular ejection fraction, by estimation, is 30 to 35%. The  left ventricle has moderately decreased function. The left ventricle  demonstrates global hypokinesis. There is moderate left ventricular  hypertrophy. Left ventricular diastolic  parameters are consistent with Grade III diastolic dysfunction  (restrictive).  2. Right ventricular systolic function is normal. The right ventricular  size is normal. There is moderately elevated pulmonary artery systolic  pressure. The estimated right ventricular systolic pressure is 90.3 mmHg.  3. Left atrial size was severely dilated.  4. Right atrial size was moderately dilated.  5. The mitral valve is normal in structure. Moderate mitral valve  regurgitation. No evidence of mitral stenosis.  6. The aortic valve is tricuspid. Aortic valve regurgitation is not  visualized. No aortic stenosis is present.  7. Aortic dilatation noted. There is mild dilatation of the ascending  aorta measuring 37 mm.  8. The inferior vena cava is dilated in size with <50% respiratory    variability, suggesting right atrial pressure of 15 mmHg.   Patient Profile     86 y.o. male with medical history of chronic diastolic heart failure, chronic afib, bilateral carotid artery disase, CAD, COPD, hyperlipidemia and HTN admitted with NSTEMI.    Assessment & Plan    1. NSTEMI - Troponin peaked > 27000. EKG with chronic LBBB. Cath delayed due to worsening renal failure. Continue ASA, statin and BB.   2. Chronic systolic and diastolic CHF - LVEF of 00-92% by echo with grade II DD.  - BNP 802 on admit - Given  IV lasix 40mg x 1 Sunday then on gentle fluids. However renal function continue to worsen. Will stop fluids.  - Continue lopressor >> consolidate to long active at discharge - No ACE/ARB given AKI - Continue hydralazine  3. AKI - Repeat renal function continue to worsen  - Repots severe renal function after dye load x2  in 2012 when had inferior STEMI and required to see nephrologist.  - Dr. Justin Mend to see the patient later today   4. HLD with hypertriglyceridemia  - 08/11/2019: Cholesterol 115; HDL 39.30; Triglycerides 1142.0 Triglyceride is over 400; calculations on Lipids are invalid.  - Continue Lipitor 80mg  qd - Reports cholesterol panel always runs higher   5. Chronic atrial fibrillation  - Eliquis on hold - On heparin  - Rete controlled on BB  6. CAD -S/p cath in 06/2010 revealing mod multivessel dzs, complicated by dissection of the ostial RCA w/ subsequent inf ST elevation requiring relook cath. This has been medically managed  7. HTN - BP stable on current medications.   8. Moderate MR  9. COPD - No active wheezing. Restart home inhaler   For questions or updates, please contact [place] Please consult [email or web address] for contact info under        Signed, Leanor Kail, PA  02/09/2020, 7:56 AM    I have personally seen and examined this patient. I agree with the assessment and plan as outlined above.  He has no chest pain this  am. He has dyspnea with exertion. BP stable. We have discussed a cardiac cath but given his  worsened renal function will delay for now. Appreciate Nephrology team input. Will hold off on diuretics for now. Follow renal function closely. Pt and wife updated at the bedside today.   Lauree Chandler 02/09/2020 10:32 AM

## 2020-02-09 NOTE — Progress Notes (Signed)
Pt has had limited output throughout shift.Pt encouraged and able to void 252ml.  Post void, bladder scan performed showing 260ml. Juliane Lack, MD paged and made aware. Will continue to monitor.

## 2020-02-09 NOTE — Progress Notes (Signed)
BP 104/64, HR 78. Morning does of Hydralazine held. Bhagat, [place] notified.

## 2020-02-10 LAB — BASIC METABOLIC PANEL
Anion gap: 13 (ref 5–15)
BUN: 75 mg/dL — ABNORMAL HIGH (ref 8–23)
CO2: 21 mmol/L — ABNORMAL LOW (ref 22–32)
Calcium: 10.2 mg/dL (ref 8.9–10.3)
Chloride: 98 mmol/L (ref 98–111)
Creatinine, Ser: 1.83 mg/dL — ABNORMAL HIGH (ref 0.61–1.24)
GFR calc Af Amer: 38 mL/min — ABNORMAL LOW (ref >60)
GFR calc non Af Amer: 33 mL/min — ABNORMAL LOW (ref >60)
Glucose, Bld: 109 mg/dL — ABNORMAL HIGH (ref 70–99)
Potassium: 4.2 mmol/L (ref 3.5–5.1)
Sodium: 132 mmol/L — ABNORMAL LOW (ref 135–145)

## 2020-02-10 LAB — CBC
HCT: 31.3 % — ABNORMAL LOW (ref 39.0–52.0)
Hemoglobin: 9.6 g/dL — ABNORMAL LOW (ref 13.0–17.0)
MCH: 28.3 pg (ref 26.0–34.0)
MCHC: 30.7 g/dL (ref 30.0–36.0)
MCV: 92.3 fL (ref 80.0–100.0)
Platelets: 193 10*3/uL (ref 150–400)
RBC: 3.39 MIL/uL — ABNORMAL LOW (ref 4.22–5.81)
RDW: 15.8 % — ABNORMAL HIGH (ref 11.5–15.5)
WBC: 10.6 10*3/uL — ABNORMAL HIGH (ref 4.0–10.5)
nRBC: 0 % (ref 0.0–0.2)

## 2020-02-10 LAB — IRON AND TIBC
Iron: 22 ug/dL — ABNORMAL LOW (ref 45–182)
Saturation Ratios: 4 % — ABNORMAL LOW (ref 17.9–39.5)
TIBC: 497 ug/dL — ABNORMAL HIGH (ref 250–450)
UIBC: 475 ug/dL

## 2020-02-10 LAB — HEPARIN LEVEL (UNFRACTIONATED): Heparin Unfractionated: 0.34 IU/mL (ref 0.30–0.70)

## 2020-02-10 MED ORDER — FUROSEMIDE 40 MG PO TABS
40.0000 mg | ORAL_TABLET | Freq: Every day | ORAL | Status: DC
  Administered 2020-02-10 – 2020-02-12 (×2): 40 mg via ORAL
  Filled 2020-02-10 (×2): qty 1

## 2020-02-10 NOTE — H&P (Signed)
Cath due to decreased LV function (new) and prior h/o Mod LAD with RCA ostial dissection during cath via right radial.

## 2020-02-10 NOTE — Progress Notes (Signed)
[place] for Heparin (Apixaban on hold) Indication: chest pain/ACS and atrial fibrillation  Allergies  Allergen Reactions  . Penicillins Itching    Has patient had a PCN reaction causing immediate rash, facial/tongue/throat swelling, SOB or lightheadedness with hypotension: Yes Has patient had a PCN reaction causing severe rash involving mucus membranes or skin necrosis: No Has patient had a PCN reaction that required hospitalization No Has patient had a PCN reaction occurring within the last 10 years: No If all of the above answers are "NO", then may proceed with Cephalosporin use.   . Sulfonamide Derivatives Other (See Comments)    "it affected my kidneys"  . Tape Other (See Comments)    Arms black and blue, tears skin.  Please use "paper" tape     Vital Signs: Temp: 98 F (36.7 C) (09/01 0514) Temp Source: Oral (09/01 0514) BP: 127/76 (09/01 0514) Pulse Rate: 78 (09/01 0040)  Labs: Recent Labs    02/07/20 1206 02/07/20 1315 02/07/20 1315 02/07/20 1535 02/08/20 0228 02/08/20 0228 02/08/20 1140 02/09/20 0330 02/10/20 0430  HGB  --   --   --   --  9.9*   < >  --  10.6* 9.6*  HCT  --   --   --   --  32.1*  --   --  33.3* 31.3*  PLT  --   --   --   --  253  --   --  216 193  APTT  --  63*  --   --   --   --   --   --   --   HEPARINUNFRC  --  0.27*   < >  --  0.45  --   --  0.41 0.34  CREATININE  --   --   --   --  1.85*   < > 2.15* 2.31* 1.83*  TROPONINIHS 21,943*  --   --  >27,000*  --   --   --   --   --    < > = values in this interval not displayed.    Estimated Creatinine Clearance: 24.3 mL/min (A) (by C-G formula based on SCr of 1.83 mg/dL (H)).   Medical History: Past Medical History:  Diagnosis Date  . Allergy   . Anemia 2012, 08/2015   chronic. Acute due to large hematoma 2013. 2017: due to gastric AVM bleeding.   . Anxiety   . CAD (coronary artery disease)    a. 06/2010 Cath: LM nl, LAD 50p/m, LCX 42m, RCA 50p/m  -->catheter dissection but good flow, 70d, RPDA 50-70;  b. 06/2010 Relook cath due to bradycardia and inf ST elev: RCA dissection @ ostium extending into coronary cusp and prox RCA, Ao dissection-->less staining than previously-->Med Rx; c. 08/2018 MV: No ischemia.  . Chronic combined systolic (congestive) and diastolic (congestive) heart failure ([place])    a. 12/2014 Echo: EF 60-65%; b. 07/2016 Echo: EF 45-50%, mild LVH. Mild AS/MS. Mod-sev MR. Mild to mod TR. PASP 42mmHg; c. 08/2018 Echo: EF 55-60%, RVSP 68.53mmHg. Sev dil LA, mod dil RA. at least mod MR, mild to mod TR. Mild AS.  Marland Kitchen COPD (chronic obstructive pulmonary disease) ([place]) 2013   exertional dyspnea  . Gastric angiodysplasia with hemorrhage 08/2015  . Hx of colonic polyps 2001, 2011   adenomatous 2001. HP 2001, 2011.  Marland Kitchen Hyperlipidemia   . Mitral regurgitation    a. 07/2016 Echo: Mod to sev MR; b. 08/2018 Echo:  at least moderate MR.  . Myocardial infarction ([place]) 06/2010.  12/2011.   "twice; 1 day apart; during/after cath". NQMI in setting severe anemia 2013.   Marland Kitchen Neuropathy 2014   in feet, likely due to spinal stenosis, DDD, HNP  . Osteoarthritis 2002   spinal stenosis, spondylolisthesis, disc protrusion multilevel in lumbar spine  . Permanent atrial fibrillation ([place]) 2012   a.  CHA2DS2VASc = 6-->eliquis;  b. 12/2015 Echo: EF 60-65%, no rwma, LVH, mild AS/MS/MR, sev dil LA, mild TR, PASP 34mmHg.  . Stroke ([place]) 2016  . Upper GI bleed 08/2015   EGD: 2 small gastric AVMs, 1 actively bleeding.  Both treated with APC ablation, clipping of the bleeder    Assessment: 42 yoM on apixaban PTA for AFib admitted with CP and started on IV heparin for cath lab. Apixaban previously held at home for procedure.  Heparin level therapeutic this morning, CBC stable. Cr improved enough potentially for cath later today.  Goal of Therapy:  Heparin level 0.3-0.7 units/mL Monitor platelets by anticoagulation protocol: Yes   Plan:  -Continue heparin 950  units/h -Daily heparin level and CBC   Arrie Senate, PharmD, BCPS Clinical Pharmacist [phone] Please check AMION for all [place] numbers 02/10/2020

## 2020-02-10 NOTE — Progress Notes (Signed)
[facility] ROUNDING NOTE   Subjective:   86 y.o. male.  With a history of coronary disease congestive heart failure COPD hyperlipidemia.  He presented with an NSTEMI.  He was scheduled for left-sided heart catheterization 02/09/2020 but appears to have a rising serum creatinine.  He had a serum creatinine increase after dye load in 2012.  There appeared to be some variation in blood pressures with some hypotension noted on 8/30 and 02/07/2020.  He was at that time receiving nitroglycerin and this has been discontinued.  His blood pressure has improved.  Renal ultrasound showed bilateral renal cysts however volume was 92 cc on the right kidney and 144 cc on the left kidney there was some right pleural effusion and ascites.  Urine sediment was negative for protein and negative for cells.  Blood pressure 127/76 pulse 83 temperature 98 O2 sats 98% 2 L nasal cannula.  Urine output 775 cc 02/09/2020  Sodium 132 potassium 4.2 chloride 98 CO2 21 BUN 35 creatinine 1.83 glucose 189 calcium 10.2 hemoglobin 9.6 WBC 10.6     Objective:  Vital signs in last 24 hours:  Temp:  [97.8 F (36.6 C)-98.7 F (37.1 C)] 98 F (36.7 C) (09/01 0514) Pulse Rate:  [78-82] 78 (09/01 0040) Resp:  [16-18] 18 (09/01 0514) BP: (104-132)/(64-88) 127/76 (09/01 0514) SpO2:  [94 %-99 %] 98 % (09/01 0715) Weight:  [66.5 kg] 66.5 kg (09/01 0514)  Weight change: 0.547 kg Filed Weights   02/08/20 0520 02/09/20 0601 02/10/20 0514  Weight: 63 kg 66 kg 66.5 kg    Intake/Output: I/O last 3 completed shifts: In: 1035.7 [P.O.:222; I.V.:813.7] Out: 1125 [Urine:1125]   Intake/Output this shift:  No intake/output data recorded.  CVS- RRR no murmurs rubs gallops RS- CTA clear to auscultation ABD- BS present soft non-distended EXT- no edema   Basic Metabolic Panel: Recent Labs  Lab 02/07/20 0127 02/07/20 0127 02/08/20 0228 02/08/20 0228 02/08/20 1140 02/09/20 0330 02/10/20 0430  NA 140  --  135  --   134* 133* 132*  K 3.7  --  4.3  --  4.3 4.3 4.2  CL 103  --  100  --  100 99 98  CO2 26  --  24  --  23 23 21*  GLUCOSE 124*  --  123*  --  99 125* 109*  BUN 37*  --  43*  --  50* 66* 75*  CREATININE 1.07  --  1.85*  --  2.15* 2.31* 1.83*  CALCIUM 9.9   < > 10.0   < > 10.1 10.1 10.2   < > = values in this interval not displayed.    Liver Function Tests: No results for input(s): AST, ALT, ALKPHOS, BILITOT, PROT, ALBUMIN in the last 168 hours. No results for input(s): LIPASE, AMYLASE in the last 168 hours. No results for input(s): AMMONIA in the last 168 hours.  CBC: Recent Labs  Lab 02/07/20 0127 02/08/20 0228 02/09/20 0330 02/10/20 0430  WBC 7.7 11.1* 12.4* 10.6*  HGB 10.5* 9.9* 10.6* 9.6*  HCT 32.8* 32.1* 33.3* 31.3*  MCV 92.7 91.2 92.2 92.3  PLT 235 253 216 193    Cardiac Enzymes: No results for input(s): CKTOTAL, CKMB, CKMBINDEX, TROPONINI in the last 168 hours.  BNP: Invalid input(s): POCBNP  CBG: No results for input(s): GLUCAP in the last 168 hours.  Microbiology: Results for orders placed or performed during the hospital encounter of 02/07/20  SARS Coronavirus 2 by RT PCR (hospital order, performed in  [facility] hospital lab) Nasopharyngeal Nasopharyngeal Swab     Status: None   Collection Time: 02/07/20  5:33 AM   Specimen: Nasopharyngeal Swab  Result Value Ref Range Status   SARS Coronavirus 2 NEGATIVE NEGATIVE Final    Comment: (NOTE) SARS-CoV-2 target nucleic acids are NOT DETECTED.  The SARS-CoV-2 RNA is generally detectable in upper and lower respiratory specimens during the acute phase of infection. The lowest concentration of SARS-CoV-2 viral copies this assay can detect is 250 copies / mL. A negative result does not preclude SARS-CoV-2 infection and should not be used as the sole basis for treatment or other patient management decisions.  A negative result may occur with improper specimen collection / handling, submission of specimen other than  nasopharyngeal swab, presence of viral mutation(s) within the areas targeted by this assay, and inadequate number of viral copies (<250 copies / mL). A negative result must be combined with clinical observations, patient history, and epidemiological information.  Fact Sheet for Patients:   StrictlyIdeas.no  Fact Sheet for Healthcare Providers: BankingDealers.co.za  This test is not yet approved or  cleared by the [place] FDA and has been authorized for detection and/or diagnosis of SARS-CoV-2 by FDA under an Emergency Use Authorization (EUA).  This EUA will remain in effect (meaning this test can be used) for the duration of the COVID-19 declaration under Section 564(b)(1) of the Act, 21 U.S.C. section 360bbb-3(b)(1), unless the authorization is terminated or revoked sooner.  Performed at [facility], [place] [address], [place], [place] [address]     Coagulation Studies: No results for input(s): LABPROT, INR in the last 72 hours.  Urinalysis: Recent Labs    02/09/20 1735  COLORURINE YELLOW  LABSPEC 1.015  PHURINE 5.0  GLUCOSEU NEGATIVE  HGBUR NEGATIVE  BILIRUBINUR NEGATIVE  KETONESUR NEGATIVE  PROTEINUR NEGATIVE  NITRITE NEGATIVE  LEUKOCYTESUR NEGATIVE      Imaging: US RENAL  Result Date: 02/09/2020 CLINICAL DATA:  Acute kidney failure; history coronary artery disease post MI, CHF, hypertension, COPD, former smoker EXAM: RENAL / URINARY TRACT ULTRASOUND COMPLETE COMPARISON:  None FINDINGS: Right Kidney: Renal measurements: 11.1 x 5.5 x 6.0 cm = volume: 92 mL. Normal cortical thickness. Upper normal cortical echogenicity. Large cyst at inferior pole 3.9 x 4.6 x 3.9 cm, simple features. Additional tiny cyst inferior pole 1.7 x 1.6 x 1.6 cm, simple features. No solid mass or hydronephrosis. No shadowing calcification. Left Kidney: Renal measurements: 11.3 x 5.5 x 4.5 cm = volume: 144 mL. Normal cortical thickness and  echogenicity. Two simple appearing LEFT renal cysts are identified, 1.5 x 2.0 x 1.6 cm at upper pole and 4.0 x 4.5 x 3.4 cm at inferior pole. No additional mass or hydronephrosis. Bladder: Appears normal for degree of bladder distention. Other: Incidentally noted mild ascites and small RIGHT pleural effusion. IMPRESSION: BILATERAL renal cysts. No renal sonographic abnormalities. Mild ascites and small RIGHT pleural effusion. Electronically Signed   By: Lavonia Dana M.D.   On: 02/09/2020 12:20     Medications:   . sodium chloride    . heparin 950 Units/hr (02/09/20 1045)   . arformoterol  15 mcg Nebulization BID  . aspirin EC  81 mg Oral Daily  . atorvastatin  80 mg Oral Daily  . escitalopram  20 mg Oral QHS  . hydrALAZINE  10 mg Oral TID  . lamoTRIgine  150 mg Oral QHS  . metoprolol tartrate  25 mg Oral BID  . multivitamin with minerals  1 tablet Oral QHS  . pantoprazole  40 mg Oral Daily  . sodium chloride flush  3 mL Intravenous Q12H  . umeclidinium bromide  1 puff Inhalation Daily   sodium chloride, acetaminophen, albuterol, nitroGLYCERIN, ondansetron (ZOFRAN) IV, polyethylene glycol, sodium chloride flush  Assessment/ Plan:  1.Acute kidney injury with baseline serum creatinine within normal range.  Noted hypotension with systolic blood pressures less than 100 mmHg 02/07/2020 02/08/2020.  I suspect this is the etiology of his acute tubular necrosis.    Renal function appears to be improving.  Ultrasound did not reveal any evidence of hydronephrosis.  Urinalysis was bland. 2. Hypertension/volume  -appears to be stable euvolemic did receive IV Lasix.  Good urine output.  Ultrasound does reveal some effusions and ascites.  Would restart home dose of Lasix at 40 mg daily 3.  Anemia we will continue to follow stable at this point no intervention needed.  Slight drop in hemoglobin below 10.  We will check iron studies 4.  Coronary artery disease status post NSTEMI.  Plan is for cardiac  catheterization once creatinine stable.  Avoid hypotension if possible.  IV heparin. 5.  Bones acute kidney injury Baseline creatinine within normal range.  We will continue to follow 6.  GERD patient using Protonix I seriously doubt this is causing any acute interstitial nephritis.    LOS: [place] @TODAY @7 :52 AM

## 2020-02-10 NOTE — Telephone Encounter (Signed)
It was me who called and I was able to reach her today to review her husband's status

## 2020-02-10 NOTE — Progress Notes (Addendum)
Progress Note  Patient Name: Eduardo [name] Date of Encounter: 02/10/2020  [facility] Cardiologist: Ida Rogue, MD   Subjective   Breathing stable. No chest discomfort at rest.   Inpatient Medications    Scheduled Meds: . arformoterol  15 mcg Nebulization BID  . aspirin EC  81 mg Oral Daily  . atorvastatin  80 mg Oral Daily  . escitalopram  20 mg Oral QHS  . hydrALAZINE  10 mg Oral TID  . lamoTRIgine  150 mg Oral QHS  . metoprolol tartrate  25 mg Oral BID  . multivitamin with minerals  1 tablet Oral QHS  . pantoprazole  40 mg Oral Daily  . sodium chloride flush  3 mL Intravenous Q12H  . umeclidinium bromide  1 puff Inhalation Daily   Continuous Infusions: . sodium chloride    . heparin 950 Units/hr (02/09/20 1045)   PRN Meds: sodium chloride, acetaminophen, albuterol, nitroGLYCERIN, ondansetron (ZOFRAN) IV, polyethylene glycol, sodium chloride flush   Vital Signs    Vitals:   02/09/20 2049 02/10/20 0000 02/10/20 0040 02/10/20 0514  BP: 123/82 132/88 132/88 127/76  Pulse: 80 82 78   Resp: 18 18  18   Temp: 98.1 F (36.7 C) 98.7 F (37.1 C)  98 F (36.7 C)  TempSrc: Oral Oral  Oral  SpO2: 99% 94% 97% 99%  Weight:    66.5 kg  Height:        Intake/Output Summary (Last 24 hours) at 02/10/2020 0705 Last data filed at 02/10/2020 0100 Gross per 24 hour  Intake 303.25 ml  Output 725 ml  Net -421.75 ml   Last 3 Weights 02/10/2020 02/09/2020 02/08/2020  Weight (lbs) 146 lb 9.7 oz 145 lb 6.4 oz 138 lb 14.2 oz  Weight (kg) 66.5 kg 65.953 kg 63 kg      Telemetry    Atrial fibrillation - Personally Reviewed  ECG    N/A  Physical Exam   GEN: No acute distress.   Neck: No JVD Cardiac: Irregular, no murmurs, rubs, or gallops.  Respiratory: faint rales GI: Soft, nontender, non-distended  MS: No edema; No deformity. Neuro:  Nonfocal  Psych: Normal affect   Labs    High Sensitivity Troponin:   Recent Labs  Lab 02/07/20 0127 02/07/20 0333  02/07/20 1206 02/07/20 1535  TROPONINIHS 33* 414* 21,943* >27,000*      Chemistry Recent Labs  Lab 02/08/20 1140 02/09/20 0330 02/10/20 0430  NA 134* 133* 132*  K 4.3 4.3 4.2  CL 100 99 98  CO2 23 23 21*  GLUCOSE 99 125* 109*  BUN 50* 66* 75*  CREATININE 2.15* 2.31* 1.83*  CALCIUM 10.1 10.1 10.2  GFRNONAA 27* 25* 33*  GFRAA 31* 29* 38*  ANIONGAP 11 11 13      Hematology Recent Labs  Lab 02/08/20 0228 02/09/20 0330 02/10/20 0430  WBC 11.1* 12.4* 10.6*  RBC 3.52* 3.61* 3.39*  HGB 9.9* 10.6* 9.6*  HCT 32.1* 33.3* 31.3*  MCV 91.2 92.2 92.3  MCH 28.1 29.4 28.3  MCHC 30.8 31.8 30.7  RDW 15.9* 15.8* 15.8*  PLT 253 216 193    BNP Recent Labs  Lab 02/07/20 1206 02/09/20 0330  BNP 802.2* 1,855.9*     DDimer No results for input(s): DDIMER in the last 168 hours.   Radiology    US RENAL  Result Date: 02/09/2020 CLINICAL DATA:  Acute kidney failure; history coronary artery disease post MI, CHF, hypertension, COPD, former smoker EXAM: RENAL / URINARY TRACT ULTRASOUND COMPLETE COMPARISON:  None FINDINGS: Right Kidney: Renal measurements: 11.1 x 5.5 x 6.0 cm = volume: 92 mL. Normal cortical thickness. Upper normal cortical echogenicity. Large cyst at inferior pole 3.9 x 4.6 x 3.9 cm, simple features. Additional tiny cyst inferior pole 1.7 x 1.6 x 1.6 cm, simple features. No solid mass or hydronephrosis. No shadowing calcification. Left Kidney: Renal measurements: 11.3 x 5.5 x 4.5 cm = volume: 144 mL. Normal cortical thickness and echogenicity. Two simple appearing LEFT renal cysts are identified, 1.5 x 2.0 x 1.6 cm at upper pole and 4.0 x 4.5 x 3.4 cm at inferior pole. No additional mass or hydronephrosis. Bladder: Appears normal for degree of bladder distention. Other: Incidentally noted mild ascites and small RIGHT pleural effusion. IMPRESSION: BILATERAL renal cysts. No renal sonographic abnormalities. Mild ascites and small RIGHT pleural effusion. Electronically Signed   By:  Lavonia Dana M.D.   On: 02/09/2020 12:20    Cardiac Studies   Echo 02/07/20 1. Left ventricular ejection fraction, by estimation, is 30 to 35%. The  left ventricle has moderately decreased function. The left ventricle  demonstrates global hypokinesis. There is moderate left ventricular  hypertrophy. Left ventricular diastolic  parameters are consistent with Grade III diastolic dysfunction  (restrictive).  2. Right ventricular systolic function is normal. The right ventricular  size is normal. There is moderately elevated pulmonary artery systolic  pressure. The estimated right ventricular systolic pressure is 99.3 mmHg.  3. Left atrial size was severely dilated.  4. Right atrial size was moderately dilated.  5. The mitral valve is normal in structure. Moderate mitral valve  regurgitation. No evidence of mitral stenosis.  6. The aortic valve is tricuspid. Aortic valve regurgitation is not  visualized. No aortic stenosis is present.  7. Aortic dilatation noted. There is mild dilatation of the ascending  aorta measuring 37 mm.  8. The inferior vena cava is dilated in size with <50% respiratory  variability, suggesting right atrial pressure of 15 mmHg.   Patient Profile     86 y.o.malewith medical history ofchronic diastolicheart failure, chronic afib, bilateral carotid artery disase, CAD,COPD, hyperlipidemiaand HTN admitted with NSTEMI.Echo with new low LVEF. Cath delayed due to worsen renal function.   Assessment & Plan    1. NSTEMI - Troponin peaked >27000. EKG with chronic LBBB. Cath delayed due to worsening renal failure, Creatinine improved today. Timing of cath per nephrology, appreciate recommendations. Will make NPO this morning tentatively.  - Continue ASA, heparin, statin and BB.   2. Chronic systolic and diastolic CHF - LVEF of 57-01% by echo with grade II DD.  - BNP 802 on admit>> 1855.9 yesterday  - Given IV lasix 40mg x 1 Sunday then on gentle fluids.  However renal function continue to worsen. Improved after discontinuation of fluids.  - Continue lopressor >> consolidate to long active at discharge - No ACE/ARB given AKI - Continue hydralazine  3. AKI - Renal function improved today - Appreciate nephrology recomendations  4. HLDwith hypertriglyceridemia -08/11/2019: Cholesterol 115; HDL 39.30; Triglycerides 1142.0 Triglyceride is over 400; calculations on Lipids are invalid. - Continue Lipitor 80mg  qd - Reports cholesterol panel always runs higher  5. Chronic atrial fibrillation  - Eliquis on hold - On heparin  - Rete controlled on BB  6. CAD -S/p cath in 06/2010 revealing mod multivessel dzs, complicated by dissection of the ostial RCA w/ subsequent inf ST elevation requiring relook cath. This has been medically managed  7. HTN - BP stable on current medications.   8.  Moderate MR 9. COPD - No active wheezing. Restart home inhaler   10. Chronic anemia - Hgb stable      For questions or updates, please contact [place] Please consult [email or web address] for contact info under        Signed, Leanor Kail, PA  02/10/2020, 7:05 AM    I have personally seen and examined this patient. I agree with the assessment and plan as outlined above.  86 yo male with history of CAD and chronic atrial fibrillation admitted with a NSTEMI. Cardiac cath has been delayed due to his renal insufficiency. No chest pain. Appreciate input of the Nephrology team. Renal function improved today but creatinine still 1.8. Will delay cardiac cath until tomorrow. Continue IV heparin for now.   Lauree Chandler 02/10/2020 10:24 AM

## 2020-02-11 ENCOUNTER — Telehealth: Payer: Self-pay | Admitting: *Deleted

## 2020-02-11 ENCOUNTER — Encounter (HOSPITAL_COMMUNITY)
Admission: EM | Disposition: A | Discharge status: Skilled Nursing Facility | Payer: Self-pay | Source: Home / Self Care | Attending: Internal Medicine

## 2020-02-11 ENCOUNTER — Encounter (HOSPITAL_COMMUNITY): Payer: Self-pay | Admitting: Interventional Cardiology

## 2020-02-11 DIAGNOSIS — I5042 Chronic combined systolic (congestive) and diastolic (congestive) heart failure: Secondary | ICD-10-CM

## 2020-02-11 DIAGNOSIS — E785 Hyperlipidemia, unspecified: Secondary | ICD-10-CM

## 2020-02-11 DIAGNOSIS — N179 Acute kidney failure, unspecified: Secondary | ICD-10-CM

## 2020-02-11 DIAGNOSIS — I2510 Atherosclerotic heart disease of native coronary artery without angina pectoris: Secondary | ICD-10-CM

## 2020-02-11 HISTORY — PX: LEFT HEART CATH AND CORONARY ANGIOGRAPHY: CATH118249

## 2020-02-11 LAB — BASIC METABOLIC PANEL
Anion gap: 10 (ref 5–15)
BUN: 75 mg/dL — ABNORMAL HIGH (ref 8–23)
CO2: 25 mmol/L (ref 22–32)
Calcium: 10.6 mg/dL — ABNORMAL HIGH (ref 8.9–10.3)
Chloride: 100 mmol/L (ref 98–111)
Creatinine, Ser: 1.71 mg/dL — ABNORMAL HIGH (ref 0.61–1.24)
GFR calc Af Amer: 41 mL/min — ABNORMAL LOW (ref >60)
GFR calc non Af Amer: 35 mL/min — ABNORMAL LOW (ref >60)
Glucose, Bld: 129 mg/dL — ABNORMAL HIGH (ref 70–99)
Potassium: 4.4 mmol/L (ref 3.5–5.1)
Sodium: 135 mmol/L (ref 135–145)

## 2020-02-11 LAB — HEPARIN LEVEL (UNFRACTIONATED): Heparin Unfractionated: 0.29 IU/mL — ABNORMAL LOW (ref 0.30–0.70)

## 2020-02-11 LAB — CBC
HCT: 29.6 % — ABNORMAL LOW (ref 39.0–52.0)
Hemoglobin: 9.2 g/dL — ABNORMAL LOW (ref 13.0–17.0)
MCH: 28.6 pg (ref 26.0–34.0)
MCHC: 31.1 g/dL (ref 30.0–36.0)
MCV: 91.9 fL (ref 80.0–100.0)
Platelets: 195 10*3/uL (ref 150–400)
RBC: 3.22 MIL/uL — ABNORMAL LOW (ref 4.22–5.81)
RDW: 15.9 % — ABNORMAL HIGH (ref 11.5–15.5)
WBC: 9.5 10*3/uL (ref 4.0–10.5)
nRBC: 0 % (ref 0.0–0.2)

## 2020-02-11 SURGERY — LEFT HEART CATH AND CORONARY ANGIOGRAPHY
Anesthesia: LOCAL

## 2020-02-11 MED ORDER — ACETAMINOPHEN 325 MG PO TABS: 650.0000 mg | ORAL_TABLET | ORAL | Status: DC | PRN

## 2020-02-11 MED ORDER — HEPARIN SODIUM (PORCINE) 1000 UNIT/ML IJ SOLN
INTRAMUSCULAR | Status: AC
  Filled 2020-02-11: qty 1

## 2020-02-11 MED ORDER — VERAPAMIL HCL 2.5 MG/ML IV SOLN
INTRAVENOUS | Status: DC | PRN
  Administered 2020-02-11: 10 mL via INTRA_ARTERIAL

## 2020-02-11 MED ORDER — HEPARIN (PORCINE) IN NACL 1000-0.9 UT/500ML-% IV SOLN
INTRAVENOUS | Status: AC
  Filled 2020-02-11: qty 1000

## 2020-02-11 MED ORDER — LIDOCAINE HCL (PF) 1 % IJ SOLN
INTRAMUSCULAR | Status: DC | PRN
  Administered 2020-02-11: 2 mL via INTRADERMAL

## 2020-02-11 MED ORDER — HEPARIN (PORCINE) 25000 UT/250ML-% IV SOLN
1000.0000 [IU]/h | INTRAVENOUS | Status: DC
  Administered 2020-02-11: 1000 [IU]/h via INTRAVENOUS
  Filled 2020-02-11: qty 250

## 2020-02-11 MED ORDER — SODIUM CHLORIDE 0.9% FLUSH
3.0000 mL | Freq: Two times a day (BID) | INTRAVENOUS | Status: DC
  Administered 2020-02-12 – 2020-02-17 (×10): 3 mL via INTRAVENOUS

## 2020-02-11 MED ORDER — SODIUM CHLORIDE 0.9 % IV SOLN: INTRAVENOUS | Status: DC

## 2020-02-11 MED ORDER — HEPARIN SODIUM (PORCINE) 1000 UNIT/ML IJ SOLN
INTRAMUSCULAR | Status: DC | PRN
  Administered 2020-02-11: 3500 [IU] via INTRAVENOUS

## 2020-02-11 MED ORDER — HYDRALAZINE HCL 20 MG/ML IJ SOLN: 10.0000 mg | INTRAMUSCULAR | Status: AC | PRN

## 2020-02-11 MED ORDER — SODIUM CHLORIDE 0.9% FLUSH
3.0000 mL | INTRAVENOUS | Status: DC | PRN
  Administered 2020-02-11 – 2020-02-13 (×3): 3 mL via INTRAVENOUS

## 2020-02-11 MED ORDER — ONDANSETRON HCL 4 MG/2ML IJ SOLN: 4.0000 mg | Freq: Four times a day (QID) | INTRAMUSCULAR | Status: DC | PRN

## 2020-02-11 MED ORDER — IOHEXOL 350 MG/ML SOLN
INTRAVENOUS | Status: AC
  Filled 2020-02-11: qty 1

## 2020-02-11 MED ORDER — LIDOCAINE HCL (PF) 1 % IJ SOLN
INTRAMUSCULAR | Status: AC
  Filled 2020-02-11: qty 30

## 2020-02-11 MED ORDER — SODIUM CHLORIDE 0.9 % IV SOLN
250.0000 mg | Freq: Every day | INTRAVENOUS | Status: AC
  Administered 2020-02-11 – 2020-02-14 (×4): 250 mg via INTRAVENOUS
  Filled 2020-02-11 (×4): qty 20

## 2020-02-11 MED ORDER — SODIUM CHLORIDE 0.9% FLUSH: 3.0000 mL | INTRAVENOUS | Status: DC | PRN

## 2020-02-11 MED ORDER — SODIUM CHLORIDE 0.9 % IV SOLN: 250.0000 mL | INTRAVENOUS | Status: DC | PRN

## 2020-02-11 MED ORDER — OXYCODONE HCL 5 MG PO TABS: 5.0000 mg | ORAL_TABLET | ORAL | Status: DC | PRN

## 2020-02-11 MED ORDER — SODIUM CHLORIDE 0.9 % WEIGHT BASED INFUSION
1.0000 mL/kg/h | INTRAVENOUS | Status: AC
  Administered 2020-02-11 (×2): 1 mL/kg/h via INTRAVENOUS

## 2020-02-11 MED ORDER — IOHEXOL 350 MG/ML SOLN
INTRAVENOUS | Status: DC | PRN
  Administered 2020-02-11: 28 mL

## 2020-02-11 MED ORDER — HEPARIN (PORCINE) IN NACL 1000-0.9 UT/500ML-% IV SOLN
INTRAVENOUS | Status: DC | PRN
  Administered 2020-02-11 (×2): 500 mL

## 2020-02-11 MED ORDER — VERAPAMIL HCL 2.5 MG/ML IV SOLN
INTRAVENOUS | Status: AC
  Filled 2020-02-11: qty 2

## 2020-02-11 MED ORDER — SODIUM CHLORIDE 0.9% FLUSH: 3.0000 mL | Freq: Two times a day (BID) | INTRAVENOUS | Status: DC

## 2020-02-11 MED ORDER — ASPIRIN 81 MG PO CHEW
81.0000 mg | CHEWABLE_TABLET | ORAL | Status: AC
  Administered 2020-02-11: 81 mg via ORAL
  Filled 2020-02-11: qty 1

## 2020-02-11 MED ORDER — LABETALOL HCL 5 MG/ML IV SOLN: 10.0000 mg | INTRAVENOUS | Status: AC | PRN

## 2020-02-11 SURGICAL SUPPLY — 13 items
CATH INFINITI 5 FR JL3.5 (CATHETERS) ×1 IMPLANT
CATH INFINITI JR4 5F (CATHETERS) ×1 IMPLANT
DEVICE RAD COMP TR BAND LRG (VASCULAR PRODUCTS) ×1 IMPLANT
GLIDESHEATH SLEND A-KIT 6F 22G (SHEATH) ×1 IMPLANT
GUIDEWIRE INQWIRE 1.5J.035X260 (WIRE) IMPLANT
INQWIRE 1.5J .035X260CM (WIRE) ×2
KIT ENCORE 26 ADVANTAGE (KITS) ×1 IMPLANT
KIT HEART LEFT (KITS) ×2 IMPLANT
PACK CARDIAC CATHETERIZATION (CUSTOM PROCEDURE TRAY) ×2 IMPLANT
SHEATH GLIDE SLENDER 4/5FR (SHEATH) ×1 IMPLANT
SHEATH PROBE COVER 6X72 (BAG) ×1 IMPLANT
TRANSDUCER W/STOPCOCK (MISCELLANEOUS) ×2 IMPLANT
TUBING CIL FLEX 10 FLL-RA (TUBING) ×2 IMPLANT

## 2020-02-11 NOTE — Progress Notes (Addendum)
[facility] ROUNDING NOTE   Subjective:   86 y.o. male.  With a history of coronary disease congestive heart failure COPD hyperlipidemia.  He presented with an NSTEMI.  He was scheduled for left-sided heart catheterization 02/09/2020 but appears to have a rising serum creatinine.  He had a serum creatinine increase after dye load in 2012.  There appeared to be some variation in blood pressures with some hypotension noted on 8/30 and 02/07/2020.  He was at that time receiving nitroglycerin and this has been discontinued.  His blood pressure has improved.  Renal ultrasound showed bilateral renal cysts however volume was 92 cc on the right kidney and 144 cc on the left kidney there was some right pleural effusion and ascites.  Urine sediment was negative for protein and negative for cells.  His baseline serum creatinine was within normal range  Plan for cardiac catheterization 02/11/2020 I suspect the contrast load will lead to acute kidney injury.  Blood pressure 134/99 pulse 89 temperature 97.8 O2 sats 100% 3 L  Sodium 135 potassium 4.4 chloride 100 CO2 25 BUN 75 creatinine 1.7 calcium 10.6 glucose 129 hemoglobin 9.2     Objective:  Vital signs in last 24 hours:  Temp:  [97.3 F (36.3 C)-98 F (36.7 C)] 97.3 F (36.3 C) (09/02 0429) Pulse Rate:  [69-89] 86 (09/02 0834) Resp:  [16-22] 20 (09/02 0429) BP: (114-153)/(64-99) 134/99 (09/02 0834) SpO2:  [98 %-100 %] 99 % (09/02 0429) Weight:  [65.2 kg] 65.2 kg (09/02 0429)  Weight change: -1.3 kg Filed Weights   02/09/20 0601 02/10/20 0514 02/11/20 0429  Weight: 66 kg 66.5 kg 65.2 kg    Intake/Output: I/O last 3 completed shifts: In: 444 [P.O.:444] Out: 1825 [Urine:1825]   Intake/Output this shift:  No intake/output data recorded.  CVS- RRR no murmurs rubs gallops RS- CTA clear to auscultation ABD- BS present soft non-distended EXT- no edema   Basic Metabolic Panel: Recent Labs  Lab 02/08/20 0228 02/08/20 0228  02/08/20 1140 02/08/20 1140 02/09/20 0330 02/10/20 0430 02/11/20 0351  NA 135  --  134*  --  133* 132* 135  K 4.3  --  4.3  --  4.3 4.2 4.4  CL 100  --  100  --  99 98 100  CO2 24  --  23  --  23 21* 25  GLUCOSE 123*  --  99  --  125* 109* 129*  BUN 43*  --  50*  --  66* 75* 75*  CREATININE 1.85*  --  2.15*  --  2.31* 1.83* 1.71*  CALCIUM 10.0   < > 10.1   < > 10.1 10.2 10.6*   < > = values in this interval not displayed.    Liver Function Tests: No results for input(s): AST, ALT, ALKPHOS, BILITOT, PROT, ALBUMIN in the last 168 hours. No results for input(s): LIPASE, AMYLASE in the last 168 hours. No results for input(s): AMMONIA in the last 168 hours.  CBC: Recent Labs  Lab 02/07/20 0127 02/08/20 0228 02/09/20 0330 02/10/20 0430 02/11/20 0351  WBC 7.7 11.1* 12.4* 10.6* 9.5  HGB 10.5* 9.9* 10.6* 9.6* 9.2*  HCT 32.8* 32.1* 33.3* 31.3* 29.6*  MCV 92.7 91.2 92.2 92.3 91.9  PLT 235 253 216 193 195    Cardiac Enzymes: No results for input(s): CKTOTAL, CKMB, CKMBINDEX, TROPONINI in the last 168 hours.  BNP: Invalid input(s): POCBNP  CBG: No results for input(s): GLUCAP in the last 168 hours.  Microbiology: Results  for orders placed or performed during the hospital encounter of 02/07/20  SARS Coronavirus 2 by RT PCR (hospital order, performed in [facility] hospital lab) Nasopharyngeal Nasopharyngeal Swab     Status: None   Collection Time: 02/07/20  5:33 AM   Specimen: Nasopharyngeal Swab  Result Value Ref Range Status   SARS Coronavirus 2 NEGATIVE NEGATIVE Final    Comment: (NOTE) SARS-CoV-2 target nucleic acids are NOT DETECTED.  The SARS-CoV-2 RNA is generally detectable in upper and lower respiratory specimens during the acute phase of infection. The lowest concentration of SARS-CoV-2 viral copies this assay can detect is 250 copies / mL. A negative result does not preclude SARS-CoV-2 infection and should not be used as the sole basis for treatment or  other patient management decisions.  A negative result may occur with improper specimen collection / handling, submission of specimen other than nasopharyngeal swab, presence of viral mutation(s) within the areas targeted by this assay, and inadequate number of viral copies (<250 copies / mL). A negative result must be combined with clinical observations, patient history, and epidemiological information.  Fact Sheet for Patients:   StrictlyIdeas.no  Fact Sheet for Healthcare Providers: BankingDealers.co.za  This test is not yet approved or  cleared by the [place] FDA and has been authorized for detection and/or diagnosis of SARS-CoV-2 by FDA under an Emergency Use Authorization (EUA).  This EUA will remain in effect (meaning this test can be used) for the duration of the COVID-19 declaration under Section 564(b)(1) of the Act, 21 U.S.C. section 360bbb-3(b)(1), unless the authorization is terminated or revoked sooner.  Performed at [facility], [place] [address], [place], [place] [address]     Coagulation Studies: No results for input(s): LABPROT, INR in the last 72 hours.  Urinalysis: Recent Labs    02/09/20 1735  COLORURINE YELLOW  LABSPEC 1.015  PHURINE 5.0  GLUCOSEU NEGATIVE  HGBUR NEGATIVE  BILIRUBINUR NEGATIVE  KETONESUR NEGATIVE  PROTEINUR NEGATIVE  NITRITE NEGATIVE  LEUKOCYTESUR NEGATIVE      Imaging: US RENAL  Result Date: 02/09/2020 CLINICAL DATA:  Acute kidney failure; history coronary artery disease post MI, CHF, hypertension, COPD, former smoker EXAM: RENAL / URINARY TRACT ULTRASOUND COMPLETE COMPARISON:  None FINDINGS: Right Kidney: Renal measurements: 11.1 x 5.5 x 6.0 cm = volume: 92 mL. Normal cortical thickness. Upper normal cortical echogenicity. Large cyst at inferior pole 3.9 x 4.6 x 3.9 cm, simple features. Additional tiny cyst inferior pole 1.7 x 1.6 x 1.6 cm, simple features. No solid mass  or hydronephrosis. No shadowing calcification. Left Kidney: Renal measurements: 11.3 x 5.5 x 4.5 cm = volume: 144 mL. Normal cortical thickness and echogenicity. Two simple appearing LEFT renal cysts are identified, 1.5 x 2.0 x 1.6 cm at upper pole and 4.0 x 4.5 x 3.4 cm at inferior pole. No additional mass or hydronephrosis. Bladder: Appears normal for degree of bladder distention. Other: Incidentally noted mild ascites and small RIGHT pleural effusion. IMPRESSION: BILATERAL renal cysts. No renal sonographic abnormalities. Mild ascites and small RIGHT pleural effusion. Electronically Signed   By: Lavonia Dana M.D.   On: 02/09/2020 12:20     Medications:   . sodium chloride    . sodium chloride    . sodium chloride 50 mL/hr at 02/11/20 0725  . heparin 950 Units/hr (02/09/20 1045)   . arformoterol  15 mcg Nebulization BID  . aspirin EC  81 mg Oral Daily  . atorvastatin  80 mg Oral Daily  .  escitalopram  20 mg Oral QHS  . furosemide  40 mg Oral Daily  . hydrALAZINE  10 mg Oral TID  . lamoTRIgine  150 mg Oral QHS  . metoprolol tartrate  25 mg Oral BID  . multivitamin with minerals  1 tablet Oral QHS  . pantoprazole  40 mg Oral Daily  . sodium chloride flush  3 mL Intravenous Q12H  . sodium chloride flush  3 mL Intravenous Q12H  . umeclidinium bromide  1 puff Inhalation Daily   sodium chloride, sodium chloride, acetaminophen, albuterol, nitroGLYCERIN, ondansetron (ZOFRAN) IV, polyethylene glycol, sodium chloride flush, sodium chloride flush  Assessment/ Plan:  1.Acute kidney injury with baseline serum creatinine within normal range.  Noted hypotension with systolic blood pressures less than 100 mmHg 02/07/2020 02/08/2020.  I suspect this is the etiology of his acute tubular necrosis.    Renal function appears to be improving.  Ultrasound did not reveal any evidence of hydronephrosis.  Urinalysis was bland. 2. Hypertension/volume  -appears to be stable euvolemic did receive IV Lasix.  Good urine  output.  Ultrasound does reveal some effusions and ascites.  Restarted home dose Lasix. 3.  Anemia we will continue to follow stable at this point no intervention needed.  Iron sats 4% we will give IV iron 4.  Coronary artery disease status post NSTEMI.  Plan is for cardiac catheterization once creatinine stable.  Creatinine is stable at 1.7 but not back to baseline.  I suspect the contrast load will lead to acute kidney injury.   Avoid hypotension if possible.  IV heparin. 5.  Bones acute kidney injury Baseline creatinine within normal range.  We will continue to follow 6.  GERD patient using Protonix I seriously doubt this is causing any acute interstitial nephritis.  7 mild hypercalcemia noted we will continue to follow does not appear to be on any hypercalcemia promoting medications.  Chest x-ray negative for any pulmonary lesions   LOS: 4 Sherril [place] @TODAY @9 :01 AM

## 2020-02-11 NOTE — CV Procedure (Addendum)
   Left heart cath via right radial artery using real-time vascular ultrasound for access.  Because of concentric calcification within the radial artery, we were not able to advance a 5/6 slender sheath but rather downsized to a 4/5 [place] sheath then advanced without difficulty.  Persistent sinus/ostial RCA dissection/aneurysm.  Sluggish antegrade RCA's flow to the mid to distal RCA..  The left main is widely patent  The LAD contains segmental proximal 70 to 80% heavily calcified stenosis.  50% stenosis in the first diagonal ostial to proximal segment.  Well-formed apical LAD to PDA collaterals filling the RCA retrograde.  The circumflex contains subtotal occlusion in the distal first obtuse marginal.  LVEDP elevated 24 mmHg.  Total contrast < 30 cc.Marland Kitchen

## 2020-02-11 NOTE — Interval H&P Note (Signed)
Cath Lab Visit (complete for each Cath Lab visit)  Clinical Evaluation Leading to the Procedure:   ACS: Yes.    Non-ACS:    Anginal Classification: CCS III  Anti-ischemic medical therapy: Minimal Therapy (1 class of medications)  Non-Invasive Test Results: No non-invasive testing performed  Prior CABG: No previous CABG      History and Physical Interval Note:  02/11/2020 11:33 AM  Eduardo [name]  has presented today for surgery, with the diagnosis of nstemi.  The various methods of treatment have been discussed with the patient and family. After consideration of risks, benefits and other options for treatment, the patient has consented to  Procedure(s): LEFT HEART CATH AND CORONARY ANGIOGRAPHY (N/A) as a surgical intervention.  The patient's history has been reviewed, patient examined, no change in status, stable for surgery.  I have reviewed the patient's chart and labs.  Questions were answered to the patient's satisfaction.     Belva Crome III

## 2020-02-11 NOTE — Progress Notes (Addendum)
[place] for Heparin (Apixaban on hold) Indication: chest pain/ACS and atrial fibrillation  Allergies  Allergen Reactions  . Penicillins Itching    Has patient had a PCN reaction causing immediate rash, facial/tongue/throat swelling, SOB or lightheadedness with hypotension: Yes Has patient had a PCN reaction causing severe rash involving mucus membranes or skin necrosis: No Has patient had a PCN reaction that required hospitalization No Has patient had a PCN reaction occurring within the last 10 years: No If all of the above answers are "NO", then may proceed with Cephalosporin use.   . Sulfonamide Derivatives Other (See Comments)    "it affected my kidneys"  . Tape Other (See Comments)    Arms black and blue, tears skin.  Please use "paper" tape     Vital Signs: Temp: 97.3 F (36.3 C) (09/02 0429) Temp Source: Oral (09/02 0429) BP: 140/90 (09/02 0429) Pulse Rate: 88 (09/02 0000)  Labs: Recent Labs    02/09/20 0330 02/09/20 0330 02/10/20 0430 02/11/20 0351  HGB 10.6*   < > 9.6* 9.2*  HCT 33.3*  --  31.3* 29.6*  PLT 216  --  193 195  HEPARINUNFRC 0.41  --  0.34 0.29*  CREATININE 2.31*  --  1.83* 1.71*   < > = values in this interval not displayed.    Estimated Creatinine Clearance: 23.9 mL/min (A) (by C-G formula based on SCr of 1.71 mg/dL (H)).   Medical History: Past Medical History:  Diagnosis Date  . Allergy   . Anemia 2012, 08/2015   chronic. Acute due to large hematoma 2013. 2017: due to gastric AVM bleeding.   . Anxiety   . CAD (coronary artery disease)    a. 06/2010 Cath: LM nl, LAD 50p/m, LCX 31m, RCA 50p/m -->catheter dissection but good flow, 70d, RPDA 50-70;  b. 06/2010 Relook cath due to bradycardia and inf ST elev: RCA dissection @ ostium extending into coronary cusp and prox RCA, Ao dissection-->less staining than previously-->Med Rx; c. 08/2018 MV: No ischemia.  . Chronic combined systolic (congestive) and  diastolic (congestive) heart failure ([place])    a. 12/2014 Echo: EF 60-65%; b. 07/2016 Echo: EF 45-50%, mild LVH. Mild AS/MS. Mod-sev MR. Mild to mod TR. PASP 61mmHg; c. 08/2018 Echo: EF 55-60%, RVSP 68.62mmHg. Sev dil LA, mod dil RA. at least mod MR, mild to mod TR. Mild AS.  Marland Kitchen COPD (chronic obstructive pulmonary disease) ([place]) 2013   exertional dyspnea  . Gastric angiodysplasia with hemorrhage 08/2015  . Hx of colonic polyps 2001, 2011   adenomatous 2001. HP 2001, 2011.  Marland Kitchen Hyperlipidemia   . Mitral regurgitation    a. 07/2016 Echo: Mod to sev MR; b. 08/2018 Echo: at least moderate MR.  . Myocardial infarction ([place]) 06/2010.  12/2011.   "twice; 1 day apart; during/after cath". NQMI in setting severe anemia 2013.   Marland Kitchen Neuropathy 2014   in feet, likely due to spinal stenosis, DDD, HNP  . Osteoarthritis 2002   spinal stenosis, spondylolisthesis, disc protrusion multilevel in lumbar spine  . Permanent atrial fibrillation ([place]) 2012   a.  CHA2DS2VASc = 6-->eliquis;  b. 12/2015 Echo: EF 60-65%, no rwma, LVH, mild AS/MS/MR, sev dil LA, mild TR, PASP 22mmHg.  . Stroke ([place]) 2016  . Upper GI bleed 08/2015   EGD: 2 small gastric AVMs, 1 actively bleeding.  Both treated with APC ablation, clipping of the bleeder    Assessment: 6 yoM on apixaban PTA for AFib admitted with CP and  started on IV heparin for cath lab. Apixaban previously held at home for procedure.  Heparin level slightly below goal this morning, CBC stable, cath likely today.  Goal of Therapy:  Heparin level 0.3-0.7 units/mL Monitor platelets by anticoagulation protocol: Yes   Plan:  -Increase heparin to 1000 units/h -Daily heparin level and CBC  ADDENDUM: Pt now s/p cath, pharmacy to resume heparin in 8h.  Plan: Restart heparin 1000 units/h no bolus at 2030 Recheck heparin level with am labs   Arrie Senate, PharmD, BCPS Clinical Pharmacist [phone] Please check AMION for all [place] numbers 02/11/2020

## 2020-02-11 NOTE — Telephone Encounter (Signed)
I work at [employer] [employer] and if they can take him that would be best. After that, I don't have as much preferences (I would need to know the choices)

## 2020-02-11 NOTE — Telephone Encounter (Signed)
Spoke to wife. She will do her best to get him in [place].

## 2020-02-11 NOTE — Telephone Encounter (Signed)
Pt's wife left message at Triage. Pt is about to be d/c from hospital, she said he will likely need to go to a rehab facility. The wife wants to know Dr. Alla German top 2 picks for a rehab/nursing facility, wife doesn't want him to go to a facility that Dr. [name] doesn't recommend.  Wife said we can call her back or send her a Pharmacist, community message with PCPs recommendations and she will get it

## 2020-02-11 NOTE — H&P (View-Only) (Signed)
Progress Note  Patient Name: Eduardo [name] Date of Encounter: 02/11/2020  [facility] Cardiologist: Ida Rogue, MD   Subjective   Breathing stable. No chest pain at rest. For cath later today.   Inpatient Medications    Scheduled Meds: . arformoterol  15 mcg Nebulization BID  . aspirin EC  81 mg Oral Daily  . atorvastatin  80 mg Oral Daily  . escitalopram  20 mg Oral QHS  . furosemide  40 mg Oral Daily  . hydrALAZINE  10 mg Oral TID  . lamoTRIgine  150 mg Oral QHS  . metoprolol tartrate  25 mg Oral BID  . multivitamin with minerals  1 tablet Oral QHS  . pantoprazole  40 mg Oral Daily  . sodium chloride flush  3 mL Intravenous Q12H  . sodium chloride flush  3 mL Intravenous Q12H  . umeclidinium bromide  1 puff Inhalation Daily   Continuous Infusions: . sodium chloride    . sodium chloride    . sodium chloride 50 mL/hr at 02/11/20 0725  . heparin 950 Units/hr (02/09/20 1045)   PRN Meds: sodium chloride, sodium chloride, acetaminophen, albuterol, nitroGLYCERIN, ondansetron (ZOFRAN) IV, polyethylene glycol, sodium chloride flush, sodium chloride flush   Vital Signs    Vitals:   02/10/20 2005 02/10/20 2208 02/11/20 0000 02/11/20 0429  BP: 129/87 (!) 153/81 (!) 143/96 140/90  Pulse: 89  88   Resp: (!) 22  20 20   Temp: 97.6 F (36.4 C)  97.9 F (36.6 C) (!) 97.3 F (36.3 C)  TempSrc: Oral  Oral Oral  SpO2: 100%  100% 99%  Weight:    65.2 kg  Height:        Intake/Output Summary (Last 24 hours) at 02/11/2020 0754 Last data filed at 02/11/2020 0616 Gross per 24 hour  Intake 444 ml  Output 1300 ml  Net -856 ml   Last 3 Weights 02/11/2020 02/10/2020 02/09/2020  Weight (lbs) 143 lb 11.8 oz 146 lb 9.7 oz 145 lb 6.4 oz  Weight (kg) 65.2 kg 66.5 kg 65.953 kg      Telemetry    Atrial fibrillation at controlled rate - Personally Reviewed  ECG    No new tracing   Physical Exam   GEN: No acute distress.   Neck: No JVD Cardiac: RRR, no murmurs, rubs, or  gallops.  Respiratory: faint bibasilar rales GI: Soft, nontender, non-distended  MS: No edema; No deformity. Neuro:  Nonfocal  Psych: Normal affect   Labs    High Sensitivity Troponin:   Recent Labs  Lab 02/07/20 0127 02/07/20 0333 02/07/20 1206 02/07/20 1535  TROPONINIHS 33* 414* 21,943* >27,000*      Chemistry Recent Labs  Lab 02/09/20 0330 02/10/20 0430 02/11/20 0351  NA 133* 132* 135  K 4.3 4.2 4.4  CL 99 98 100  CO2 23 21* 25  GLUCOSE 125* 109* 129*  BUN 66* 75* 75*  CREATININE 2.31* 1.83* 1.71*  CALCIUM 10.1 10.2 10.6*  GFRNONAA 25* 33* 35*  GFRAA 29* 38* 41*  ANIONGAP 11 13 10      Hematology Recent Labs  Lab 02/09/20 0330 02/10/20 0430 02/11/20 0351  WBC 12.4* 10.6* 9.5  RBC 3.61* 3.39* 3.22*  HGB 10.6* 9.6* 9.2*  HCT 33.3* 31.3* 29.6*  MCV 92.2 92.3 91.9  MCH 29.4 28.3 28.6  MCHC 31.8 30.7 31.1  RDW 15.8* 15.8* 15.9*  PLT 216 193 195    BNP Recent Labs  Lab 02/07/20 1206 02/09/20 0330  BNP 802.2*  1,855.9*      Radiology    US RENAL  Result Date: 02/09/2020 CLINICAL DATA:  Acute kidney failure; history coronary artery disease post MI, CHF, hypertension, COPD, former smoker EXAM: RENAL / URINARY TRACT ULTRASOUND COMPLETE COMPARISON:  None FINDINGS: Right Kidney: Renal measurements: 11.1 x 5.5 x 6.0 cm = volume: 92 mL. Normal cortical thickness. Upper normal cortical echogenicity. Large cyst at inferior pole 3.9 x 4.6 x 3.9 cm, simple features. Additional tiny cyst inferior pole 1.7 x 1.6 x 1.6 cm, simple features. No solid mass or hydronephrosis. No shadowing calcification. Left Kidney: Renal measurements: 11.3 x 5.5 x 4.5 cm = volume: 144 mL. Normal cortical thickness and echogenicity. Two simple appearing LEFT renal cysts are identified, 1.5 x 2.0 x 1.6 cm at upper pole and 4.0 x 4.5 x 3.4 cm at inferior pole. No additional mass or hydronephrosis. Bladder: Appears normal for degree of bladder distention. Other: Incidentally noted mild  ascites and small RIGHT pleural effusion. IMPRESSION: BILATERAL renal cysts. No renal sonographic abnormalities. Mild ascites and small RIGHT pleural effusion. Electronically Signed   By: Lavonia Dana M.D.   On: 02/09/2020 12:20    Cardiac Studies   Echo 02/07/20 1. Left ventricular ejection fraction, by estimation, is 30 to 35%. The  left ventricle has moderately decreased function. The left ventricle  demonstrates global hypokinesis. There is moderate left ventricular  hypertrophy. Left ventricular diastolic  parameters are consistent with Grade III diastolic dysfunction  (restrictive).  2. Right ventricular systolic function is normal. The right ventricular  size is normal. There is moderately elevated pulmonary artery systolic  pressure. The estimated right ventricular systolic pressure is 19.3 mmHg.  3. Left atrial size was severely dilated.  4. Right atrial size was moderately dilated.  5. The mitral valve is normal in structure. Moderate mitral valve  regurgitation. No evidence of mitral stenosis.  6. The aortic valve is tricuspid. Aortic valve regurgitation is not  visualized. No aortic stenosis is present.  7. Aortic dilatation noted. There is mild dilatation of the ascending  aorta measuring 37 mm.  8. The inferior vena cava is dilated in size with <50% respiratory  variability, suggesting right atrial pressure of 15 mmHg.   Patient Profile     86 y.o.malewith medical history ofchronic diastolicheart failure, chronic afib, bilateral carotid artery disase, CAD,COPD, hyperlipidemiaand HTN admitted with NSTEMI.Echo with new low LVEF. Cath delayed due to worsen renal function.   Assessment & Plan    1. NSTEMI - Troponin peaked >27000. EKG with chronic LBBB.Cath delayed due to worsening renal failure. Now, renal function improving. For cath later today.  - Continue ASA, heparin, statin and BB.  2. Chronic systolicand diastolicCHF - LVEF of 79-02% by echo  with grade II DD.  - BNP 802on admit>> 1855.9 yesterday  - Given IV lasix 40mg x 1Sunday then on gentle fluids.However renal function continue toworsen. Improved after discontinuation of fluids. Due to finding of ascites and effusion on renal US, he is back on daily po lasix. Good urine output with further improve in renal function.  -Continue lopressor >> consolidate to long active at discharge - No ACE/ARB given AKI - Continue hydralazine - Consider adding Imdur post cath  3. AKI - Renal function improving. As above.  - Appreciate nephrology recomendations  4. HLDwith hypertriglyceridemia -08/11/2019: Cholesterol 115; HDL 39.30; Triglycerides 1142.0 Triglyceride is over 400; calculations on Lipids are invalid. - Continue Lipitor 80mg  qd - Reports cholesterol panel always runs higher  5. Chronic  atrial fibrillation  - Eliquis on hold - On heparin  - Rete controlled on BB  6. CAD -S/p cath in 06/2010 revealing mod multivessel dzs, complicated by dissection of the ostial RCA w/ subsequent inf ST elevation requiring relook cath. This has been medically managed  7. HTN - BPstable on current medications.  8. Moderate MR 9. COPD - No active wheezing. Restarted home inhaler   10. Chronic anemia - Hgb stable. Likely needs iron supplement   For questions or updates, please contact [place] Please consult [email or web address] for contact info under        Signed, Leanor Kail, PA  02/11/2020, 7:54 AM    I have personally seen and examined this patient. I agree with the assessment and plan as outlined above.  Renal function improved today. Will plan cardiac cath. The patient and his wife understand that his event was over the weekend and there may not be options for PCI. Eliquis is on hold. He has been on IV heparin.   Lauree Chandler 02/11/2020 8:46 AM

## 2020-02-11 NOTE — Progress Notes (Addendum)
Progress Note  Patient Name: Eduardo [name] Date of Encounter: 02/11/2020  [facility] Cardiologist: Ida Rogue, MD   Subjective   Breathing stable. No chest pain at rest. For cath later today.   Inpatient Medications    Scheduled Meds: . arformoterol  15 mcg Nebulization BID  . aspirin EC  81 mg Oral Daily  . atorvastatin  80 mg Oral Daily  . escitalopram  20 mg Oral QHS  . furosemide  40 mg Oral Daily  . hydrALAZINE  10 mg Oral TID  . lamoTRIgine  150 mg Oral QHS  . metoprolol tartrate  25 mg Oral BID  . multivitamin with minerals  1 tablet Oral QHS  . pantoprazole  40 mg Oral Daily  . sodium chloride flush  3 mL Intravenous Q12H  . sodium chloride flush  3 mL Intravenous Q12H  . umeclidinium bromide  1 puff Inhalation Daily   Continuous Infusions: . sodium chloride    . sodium chloride    . sodium chloride 50 mL/hr at 02/11/20 0725  . heparin 950 Units/hr (02/09/20 1045)   PRN Meds: sodium chloride, sodium chloride, acetaminophen, albuterol, nitroGLYCERIN, ondansetron (ZOFRAN) IV, polyethylene glycol, sodium chloride flush, sodium chloride flush   Vital Signs    Vitals:   02/10/20 2005 02/10/20 2208 02/11/20 0000 02/11/20 0429  BP: 129/87 (!) 153/81 (!) 143/96 140/90  Pulse: 89  88   Resp: (!) 22  20 20   Temp: 97.6 F (36.4 C)  97.9 F (36.6 C) (!) 97.3 F (36.3 C)  TempSrc: Oral  Oral Oral  SpO2: 100%  100% 99%  Weight:    65.2 kg  Height:        Intake/Output Summary (Last 24 hours) at 02/11/2020 0754 Last data filed at 02/11/2020 0616 Gross per 24 hour  Intake 444 ml  Output 1300 ml  Net -856 ml   Last 3 Weights 02/11/2020 02/10/2020 02/09/2020  Weight (lbs) 143 lb 11.8 oz 146 lb 9.7 oz 145 lb 6.4 oz  Weight (kg) 65.2 kg 66.5 kg 65.953 kg      Telemetry    Atrial fibrillation at controlled rate - Personally Reviewed  ECG    No new tracing   Physical Exam   GEN: No acute distress.   Neck: No JVD Cardiac: RRR, no murmurs, rubs, or  gallops.  Respiratory: faint bibasilar rales GI: Soft, nontender, non-distended  MS: No edema; No deformity. Neuro:  Nonfocal  Psych: Normal affect   Labs    High Sensitivity Troponin:   Recent Labs  Lab 02/07/20 0127 02/07/20 0333 02/07/20 1206 02/07/20 1535  TROPONINIHS 33* 414* 21,943* >27,000*      Chemistry Recent Labs  Lab 02/09/20 0330 02/10/20 0430 02/11/20 0351  NA 133* 132* 135  K 4.3 4.2 4.4  CL 99 98 100  CO2 23 21* 25  GLUCOSE 125* 109* 129*  BUN 66* 75* 75*  CREATININE 2.31* 1.83* 1.71*  CALCIUM 10.1 10.2 10.6*  GFRNONAA 25* 33* 35*  GFRAA 29* 38* 41*  ANIONGAP 11 13 10      Hematology Recent Labs  Lab 02/09/20 0330 02/10/20 0430 02/11/20 0351  WBC 12.4* 10.6* 9.5  RBC 3.61* 3.39* 3.22*  HGB 10.6* 9.6* 9.2*  HCT 33.3* 31.3* 29.6*  MCV 92.2 92.3 91.9  MCH 29.4 28.3 28.6  MCHC 31.8 30.7 31.1  RDW 15.8* 15.8* 15.9*  PLT 216 193 195    BNP Recent Labs  Lab 02/07/20 1206 02/09/20 0330  BNP 802.2*  1,855.9*      Radiology    US RENAL  Result Date: 02/09/2020 CLINICAL DATA:  Acute kidney failure; history coronary artery disease post MI, CHF, hypertension, COPD, former smoker EXAM: RENAL / URINARY TRACT ULTRASOUND COMPLETE COMPARISON:  None FINDINGS: Right Kidney: Renal measurements: 11.1 x 5.5 x 6.0 cm = volume: 92 mL. Normal cortical thickness. Upper normal cortical echogenicity. Large cyst at inferior pole 3.9 x 4.6 x 3.9 cm, simple features. Additional tiny cyst inferior pole 1.7 x 1.6 x 1.6 cm, simple features. No solid mass or hydronephrosis. No shadowing calcification. Left Kidney: Renal measurements: 11.3 x 5.5 x 4.5 cm = volume: 144 mL. Normal cortical thickness and echogenicity. Two simple appearing LEFT renal cysts are identified, 1.5 x 2.0 x 1.6 cm at upper pole and 4.0 x 4.5 x 3.4 cm at inferior pole. No additional mass or hydronephrosis. Bladder: Appears normal for degree of bladder distention. Other: Incidentally noted mild  ascites and small RIGHT pleural effusion. IMPRESSION: BILATERAL renal cysts. No renal sonographic abnormalities. Mild ascites and small RIGHT pleural effusion. Electronically Signed   By: Lavonia Dana M.D.   On: 02/09/2020 12:20    Cardiac Studies   Echo 02/07/20 1. Left ventricular ejection fraction, by estimation, is 30 to 35%. The  left ventricle has moderately decreased function. The left ventricle  demonstrates global hypokinesis. There is moderate left ventricular  hypertrophy. Left ventricular diastolic  parameters are consistent with Grade III diastolic dysfunction  (restrictive).  2. Right ventricular systolic function is normal. The right ventricular  size is normal. There is moderately elevated pulmonary artery systolic  pressure. The estimated right ventricular systolic pressure is 13.0 mmHg.  3. Left atrial size was severely dilated.  4. Right atrial size was moderately dilated.  5. The mitral valve is normal in structure. Moderate mitral valve  regurgitation. No evidence of mitral stenosis.  6. The aortic valve is tricuspid. Aortic valve regurgitation is not  visualized. No aortic stenosis is present.  7. Aortic dilatation noted. There is mild dilatation of the ascending  aorta measuring 37 mm.  8. The inferior vena cava is dilated in size with <50% respiratory  variability, suggesting right atrial pressure of 15 mmHg.   Patient Profile     86 y.o.malewith medical history ofchronic diastolicheart failure, chronic afib, bilateral carotid artery disase, CAD,COPD, hyperlipidemiaand HTN admitted with NSTEMI.Echo with new low LVEF. Cath delayed due to worsen renal function.   Assessment & Plan    1. NSTEMI - Troponin peaked >27000. EKG with chronic LBBB.Cath delayed due to worsening renal failure. Now, renal function improving. For cath later today.  - Continue ASA, heparin, statin and BB.  2. Chronic systolicand diastolicCHF - LVEF of 86-57% by echo  with grade II DD.  - BNP 802on admit>> 1855.9 yesterday  - Given IV lasix 40mg x 1Sunday then on gentle fluids.However renal function continue toworsen. Improved after discontinuation of fluids. Due to finding of ascites and effusion on renal US, he is back on daily po lasix. Good urine output with further improve in renal function.  -Continue lopressor >> consolidate to long active at discharge - No ACE/ARB given AKI - Continue hydralazine - Consider adding Imdur post cath  3. AKI - Renal function improving. As above.  - Appreciate nephrology recomendations  4. HLDwith hypertriglyceridemia -08/11/2019: Cholesterol 115; HDL 39.30; Triglycerides 1142.0 Triglyceride is over 400; calculations on Lipids are invalid. - Continue Lipitor 80mg  qd - Reports cholesterol panel always runs higher  5. Chronic  atrial fibrillation  - Eliquis on hold - On heparin  - Rete controlled on BB  6. CAD -S/p cath in 06/2010 revealing mod multivessel dzs, complicated by dissection of the ostial RCA w/ subsequent inf ST elevation requiring relook cath. This has been medically managed  7. HTN - BPstable on current medications.  8. Moderate MR 9. COPD - No active wheezing. Restarted home inhaler   10. Chronic anemia - Hgb stable. Likely needs iron supplement   For questions or updates, please contact [place] Please consult [email or web address] for contact info under        Signed, Leanor Kail, PA  02/11/2020, 7:54 AM    I have personally seen and examined this patient. I agree with the assessment and plan as outlined above.  Renal function improved today. Will plan cardiac cath. The patient and his wife understand that his event was over the weekend and there may not be options for PCI. Eliquis is on hold. He has been on IV heparin.   Lauree Chandler 02/11/2020 8:46 AM

## 2020-02-12 DIAGNOSIS — I5023 Acute on chronic systolic (congestive) heart failure: Secondary | ICD-10-CM

## 2020-02-12 DIAGNOSIS — I255 Ischemic cardiomyopathy: Secondary | ICD-10-CM

## 2020-02-12 LAB — RENAL FUNCTION PANEL
Albumin: 3.4 g/dL — ABNORMAL LOW (ref 3.5–5.0)
Anion gap: 13 (ref 5–15)
BUN: 69 mg/dL — ABNORMAL HIGH (ref 8–23)
CO2: 22 mmol/L (ref 22–32)
Calcium: 10.4 mg/dL — ABNORMAL HIGH (ref 8.9–10.3)
Chloride: 99 mmol/L (ref 98–111)
Creatinine, Ser: 1.51 mg/dL — ABNORMAL HIGH (ref 0.61–1.24)
GFR calc Af Amer: 48 mL/min — ABNORMAL LOW (ref >60)
GFR calc non Af Amer: 41 mL/min — ABNORMAL LOW (ref >60)
Glucose, Bld: 140 mg/dL — ABNORMAL HIGH (ref 70–99)
Phosphorus: 3.8 mg/dL (ref 2.5–4.6)
Potassium: 4.4 mmol/L (ref 3.5–5.1)
Sodium: 134 mmol/L — ABNORMAL LOW (ref 135–145)

## 2020-02-12 LAB — CBC WITH DIFFERENTIAL/PLATELET
Abs Immature Granulocytes: 0.03 10*3/uL (ref 0.00–0.07)
Basophils Absolute: 0 10*3/uL (ref 0.0–0.1)
Basophils Relative: 0 %
Eosinophils Absolute: 0.1 10*3/uL (ref 0.0–0.5)
Eosinophils Relative: 1 %
HCT: 29.9 % — ABNORMAL LOW (ref 39.0–52.0)
Hemoglobin: 9.1 g/dL — ABNORMAL LOW (ref 13.0–17.0)
Immature Granulocytes: 0 %
Lymphocytes Relative: 13 %
Lymphs Abs: 1.2 10*3/uL (ref 0.7–4.0)
MCH: 28.2 pg (ref 26.0–34.0)
MCHC: 30.4 g/dL (ref 30.0–36.0)
MCV: 92.6 fL (ref 80.0–100.0)
Monocytes Absolute: 1.2 10*3/uL — ABNORMAL HIGH (ref 0.1–1.0)
Monocytes Relative: 13 %
Neutro Abs: 6.9 10*3/uL (ref 1.7–7.7)
Neutrophils Relative %: 73 %
Platelets: 233 10*3/uL (ref 150–400)
RBC: 3.23 MIL/uL — ABNORMAL LOW (ref 4.22–5.81)
RDW: 16.3 % — ABNORMAL HIGH (ref 11.5–15.5)
WBC: 9.4 10*3/uL (ref 4.0–10.5)
nRBC: 0 % (ref 0.0–0.2)

## 2020-02-12 MED ORDER — FUROSEMIDE 10 MG/ML IJ SOLN
40.0000 mg | Freq: Two times a day (BID) | INTRAMUSCULAR | Status: DC
  Administered 2020-02-12: 40 mg via INTRAVENOUS
  Filled 2020-02-12: qty 4

## 2020-02-12 MED ORDER — FUROSEMIDE 10 MG/ML IJ SOLN: 40.0000 mg | Freq: Two times a day (BID) | INTRAMUSCULAR | Status: DC

## 2020-02-12 MED ORDER — APIXABAN 2.5 MG PO TABS
2.5000 mg | ORAL_TABLET | Freq: Two times a day (BID) | ORAL | Status: DC
  Administered 2020-02-12 – 2020-02-18 (×13): 2.5 mg via ORAL
  Filled 2020-02-12 (×13): qty 1

## 2020-02-12 MED ORDER — METOPROLOL SUCCINATE ER 50 MG PO TB24
50.0000 mg | ORAL_TABLET | Freq: Every day | ORAL | Status: DC
  Administered 2020-02-13 – 2020-02-18 (×6): 50 mg via ORAL
  Filled 2020-02-12 (×6): qty 1

## 2020-02-12 NOTE — Progress Notes (Addendum)
Progress Note  Patient Name: NABEEL GLADSON Date of Encounter: 02/12/2020  [facility] Cardiologist: Ida Rogue, MD   Subjective   Pt reports dyspnea with minimal movement. No chest pain.   Inpatient Medications    Scheduled Meds: . arformoterol  15 mcg Nebulization BID  . aspirin EC  81 mg Oral Daily  . atorvastatin  80 mg Oral Daily  . escitalopram  20 mg Oral QHS  . furosemide  40 mg Oral Daily  . hydrALAZINE  10 mg Oral TID  . lamoTRIgine  150 mg Oral QHS  . metoprolol tartrate  25 mg Oral BID  . multivitamin with minerals  1 tablet Oral QHS  . pantoprazole  40 mg Oral Daily  . sodium chloride flush  3 mL Intravenous Q12H  . umeclidinium bromide  1 puff Inhalation Daily   Continuous Infusions: . sodium chloride    . ferric gluconate (FERRLECIT/NULECIT) IV 250 mg (02/11/20 1335)  . heparin 1,000 Units/hr (02/12/20 0630)   PRN Meds: sodium chloride, acetaminophen, albuterol, nitroGLYCERIN, ondansetron (ZOFRAN) IV, oxyCODONE, polyethylene glycol, sodium chloride flush   Vital Signs    Vitals:   02/12/20 0331 02/12/20 0500 02/12/20 0754 02/12/20 0828  BP: 131/72   133/85  Pulse: 81   86  Resp: 18   18  Temp: 98.5 F (36.9 C)   97.6 F (36.4 C)  TempSrc: Oral   Oral  SpO2:   98% 98%  Weight:  67.2 kg    Height:        Intake/Output Summary (Last 24 hours) at 02/12/2020 0849 Last data filed at 02/12/2020 0700 Gross per 24 hour  Intake 1606.29 ml  Output 850 ml  Net 756.29 ml   Last 3 Weights 02/12/2020 02/11/2020 02/10/2020  Weight (lbs) 148 lb 2.4 oz 143 lb 11.8 oz 146 lb 9.7 oz  Weight (kg) 67.2 kg 65.2 kg 66.5 kg      Telemetry    Afib, ventricular rates 70-80s - Personally Reviewed  ECG    No new tracings - Personally Reviewed  Physical Exam   GEN: No acute distress.   Neck: No JVD - exam difficult Cardiac: RRR, no murmurs, rubs, or gallops.  Respiratory: coarse sounds throughout, crackles in bases GI: Soft, nontender, non-distended    MS: No edema; No deformity. Neuro:  Nonfocal  Psych: Normal affect   Labs    High Sensitivity Troponin:   Recent Labs  Lab 02/07/20 0127 02/07/20 0333 02/07/20 1206 02/07/20 1535  TROPONINIHS 33* 414* 21,943* >27,000*      Chemistry Recent Labs  Lab 02/09/20 0330 02/10/20 0430 02/11/20 0351  NA 133* 132* 135  K 4.3 4.2 4.4  CL 99 98 100  CO2 23 21* 25  GLUCOSE 125* 109* 129*  BUN 66* 75* 75*  CREATININE 2.31* 1.83* 1.71*  CALCIUM 10.1 10.2 10.6*  GFRNONAA 25* 33* 35*  GFRAA 29* 38* 41*  ANIONGAP 11 13 10      Hematology Recent Labs  Lab 02/09/20 0330 02/10/20 0430 02/11/20 0351  WBC 12.4* 10.6* 9.5  RBC 3.61* 3.39* 3.22*  HGB 10.6* 9.6* 9.2*  HCT 33.3* 31.3* 29.6*  MCV 92.2 92.3 91.9  MCH 29.4 28.3 28.6  MCHC 31.8 30.7 31.1  RDW 15.8* 15.8* 15.9*  PLT 216 193 195    BNP Recent Labs  Lab 02/07/20 1206 02/09/20 0330  BNP 802.2* 1,855.9*     DDimer No results for input(s): DDIMER in the last 168 hours.   Radiology  CARDIAC CATHETERIZATION  Result Date: 02/11/2020  Widely patent left main  Proximal LAD 70 to 80% with either thrombus or calcified nodular plaque.  First diagonal 50% narrowed proximally.  First obtuse marginal circumflex and the distal third contains subtotal occlusion which could be an acute culprit lesion.  Functionally occluded right coronary with ostial/sinus of Valsalva aneurysm.  Left-to-right collaterals from LAD to PDA around the apex.  Severely elevated LVEDP consistent with acute on chronic combined systolic and diastolic heart failure.  Total contrast 27 cc RECOMMENDATIONS:  Track creatinine  We will hydrate but patient will be at risk for heart failure.  Best therapy for CAD is medical management.   Cardiac Studies   Left heart cath 02/11/20:  Widely patent left main  Proximal LAD 70 to 80% with either thrombus or calcified nodular plaque.  First diagonal 50% narrowed proximally.  First obtuse marginal  circumflex and the distal third contains subtotal occlusion which could be an acute culprit lesion.  Functionally occluded right coronary with ostial/sinus of Valsalva aneurysm.  Left-to-right collaterals from LAD to PDA around the apex.  Severely elevated LVEDP consistent with acute on chronic combined systolic and diastolic heart failure.  Total contrast 27 cc  RECOMMENDATIONS:   Track creatinine  We will hydrate but patient will be at risk for heart failure.  Best therapy for CAD is medical management.   Echo 02/07/20: 1. Left ventricular ejection fraction, by estimation, is 30 to 35%. The  left ventricle has moderately decreased function. The left ventricle  demonstrates global hypokinesis. There is moderate left ventricular  hypertrophy. Left ventricular diastolic  parameters are consistent with Grade III diastolic dysfunction  (restrictive).  2. Right ventricular systolic function is normal. The right ventricular  size is normal. There is moderately elevated pulmonary artery systolic  pressure. The estimated right ventricular systolic pressure is 68.3 mmHg.  3. Left atrial size was severely dilated.  4. Right atrial size was moderately dilated.  5. The mitral valve is normal in structure. Moderate mitral valve  regurgitation. No evidence of mitral stenosis.  6. The aortic valve is tricuspid. Aortic valve regurgitation is not  visualized. No aortic stenosis is present.  7. Aortic dilatation noted. There is mild dilatation of the ascending  aorta measuring 37 mm.  8. The inferior vena cava is dilated in size with <50% respiratory  variability, suggesting right atrial pressure of 15 mmHg.    Patient Profile     86 y.o. male with medical history ofchronic diastolicheart failure, chronic afib, bilateral carotid artery disase, CAD,COPD, hyperlipidemiaand HTN admitted with NSTEMI.Echo with new low LVEF. Cath delayed due to worsen renal function.  Assessment &  Plan    NSTEMI CAD - heart cath yesterday with 99% stenosis in the RCA with left to right collaterals, 80% stenosis in the proximal LAD, 99% stenosis in the first marginal felt to be the culprit lesion --> recommended medical management - ASA and BB   Ischemic cardiomyopathy Chronic systolic heart failure - EF this admission was 25-30%, decline from 2018 echo - elevated LVEDP during heart cath - BNP 802 --> 1856 - diuresis has been complicated by worsening renal function - labs pending today - holding ACEI/ARB, ARNI - continue hydralazine, expect renal function to improve   Chronic Afib - was maintained on eliquis, verapamil - will restart eliquis when safe to do so, currently on heparin - will transition metoprolol tartrate to succinate for CHF - hold verapamil - rates are controlled today   Acute  kidney injury - sCr 1.71 yesterday - labs pending today   Hyperlipidemia - home fenofibrate, 80 mg lipitor   Deconditioning - needs PT consult, will likely need rehab at discharge   COPD - continue home inhalers - complicating his dyspnea    Difficult situation. Need to titrate therapy as renal function improves. No labs available yet today. If improved, would benefit from IV lasix.     For questions or updates, please contact [facility] Please consult [email or web address] for contact info under        Signed, Ledora Bottcher, PA  02/12/2020, 8:49 AM    I have personally seen and examined this patient. I agree with the assessment and plan as outlined above.  He is having dyspnea with minimal exertion. No chest pain.  Multi-vessel CAD by cath yesterday. Dr. Tamala Julian recommended medical therapy for his CAD His cardiomyopathy is felt to be ischemic in etiology. Will continue beta blocker. No ACE-inh or ARB due to his renal insufficiency.  Based on his increased LVEDP, will start IV Lasix today.  Stop heparin. Restart Eliquis.  PT consult.  May need SNF placement.    Lauree Chandler 02/12/2020 10:12 AM

## 2020-02-12 NOTE — Progress Notes (Signed)
Eduardo [name] ROUNDING NOTE   Subjective:   86 y.o. male.  With a history of coronary disease congestive heart failure COPD hyperlipidemia.  He presented with an NSTEMI.  He was scheduled for left-sided heart catheterization 02/09/2020 but appears to have a rising serum creatinine.  He had a serum creatinine increase after dye load in 2012.  There appeared to be some variation in blood pressures with some hypotension noted on 8/30 and 02/07/2020.  He was at that time receiving nitroglycerin and this has been discontinued.  His blood pressure has improved.  Renal ultrasound showed bilateral renal cysts however volume was 92 cc on the right kidney and 144 cc on the left kidney there was some right pleural effusion and ascites.  Urine sediment was negative for protein and negative for cells.  His baseline serum creatinine was within normal range  Plan for cardiac catheterization 02/11/2020 I suspect the contrast load will lead to acute kidney injury.  Appreciate assistance of Dr. Daneen Schick.  Blood pressure 131/72 pulse 80 temperature 98.5 O2 sats 98% 2 L nasal cannula  Labs pending this morning     Objective:  Vital signs in last 24 hours:  Temp:  [97.3 F (36.3 C)-98.5 F (36.9 C)] 97.6 F (36.4 C) (09/03 0828) Pulse Rate:  [59-91] 86 (09/03 0828) Resp:  [5-54] 18 (09/03 0828) BP: (103-147)/(55-99) 133/85 (09/03 0828) SpO2:  [0 %-100 %] 98 % (09/03 0828) Weight:  [67.2 kg] 67.2 kg (09/03 0500)  Weight change: 2 kg Filed Weights   02/10/20 0514 02/11/20 0429 02/12/20 0500  Weight: 66.5 kg 65.2 kg 67.2 kg    Intake/Output: I/O last 3 completed shifts: In: 2016.3 [P.O.:600; I.V.:1416.3] Out: 1500 [Urine:1500]   Intake/Output this shift:  No intake/output data recorded.  CVS- RRR no murmurs rubs gallops RS- CTA clear to auscultation ABD- BS present soft non-distended EXT- no edema   Basic Metabolic Panel: Recent Labs  Lab 02/08/20 0228 02/08/20 0228  02/08/20 1140 02/08/20 1140 02/09/20 0330 02/10/20 0430 02/11/20 0351  NA 135  --  134*  --  133* 132* 135  K 4.3  --  4.3  --  4.3 4.2 4.4  CL 100  --  100  --  99 98 100  CO2 24  --  23  --  23 21* 25  GLUCOSE 123*  --  99  --  125* 109* 129*  BUN 43*  --  50*  --  66* 75* 75*  CREATININE 1.85*  --  2.15*  --  2.31* 1.83* 1.71*  CALCIUM 10.0   < > 10.1   < > 10.1 10.2 10.6*   < > = values in this interval not displayed.    Liver Function Tests: No results for input(s): AST, ALT, ALKPHOS, BILITOT, PROT, ALBUMIN in the last 168 hours. No results for input(s): LIPASE, AMYLASE in the last 168 hours. No results for input(s): AMMONIA in the last 168 hours.  CBC: Recent Labs  Lab 02/07/20 0127 02/08/20 0228 02/09/20 0330 02/10/20 0430 02/11/20 0351  WBC 7.7 11.1* 12.4* 10.6* 9.5  HGB 10.5* 9.9* 10.6* 9.6* 9.2*  HCT 32.8* 32.1* 33.3* 31.3* 29.6*  MCV 92.7 91.2 92.2 92.3 91.9  PLT 235 253 216 193 195    Cardiac Enzymes: No results for input(s): CKTOTAL, CKMB, CKMBINDEX, TROPONINI in the last 168 hours.  BNP: Invalid input(s): POCBNP  CBG: No results for input(s): GLUCAP in the last 168 hours.  Microbiology: Results for orders placed or  performed during the hospital encounter of 02/07/20  SARS Coronavirus 2 by RT PCR (hospital order, performed in [facility] hospital lab) Nasopharyngeal Nasopharyngeal Swab     Status: None   Collection Time: 02/07/20  5:33 AM   Specimen: Nasopharyngeal Swab  Result Value Ref Range Status   SARS Coronavirus 2 NEGATIVE NEGATIVE Final    Comment: (NOTE) SARS-CoV-2 target nucleic acids are NOT DETECTED.  The SARS-CoV-2 RNA is generally detectable in upper and lower respiratory specimens during the acute phase of infection. The lowest concentration of SARS-CoV-2 viral copies this assay can detect is 250 copies / mL. A negative result does not preclude SARS-CoV-2 infection and should not be used as the sole basis for treatment or  other patient management decisions.  A negative result may occur with improper specimen collection / handling, submission of specimen other than nasopharyngeal swab, presence of viral mutation(s) within the areas targeted by this assay, and inadequate number of viral copies (<250 copies / mL). A negative result must be combined with clinical observations, patient history, and epidemiological information.  Fact Sheet for Patients:   StrictlyIdeas.no  Fact Sheet for Healthcare Providers: BankingDealers.co.za  This test is not yet approved or  cleared by the [place] FDA and has been authorized for detection and/or diagnosis of SARS-CoV-2 by FDA under an Emergency Use Authorization (EUA).  This EUA will remain in effect (meaning this test can be used) for the duration of the COVID-19 declaration under Section 564(b)(1) of the Act, 21 U.S.C. section 360bbb-3(b)(1), unless the authorization is terminated or revoked sooner.  Performed at [facility], [place] [address], [place], [place] [address]     Coagulation Studies: No results for input(s): LABPROT, INR in the last 72 hours.  Urinalysis: Recent Labs    02/09/20 1735  COLORURINE YELLOW  LABSPEC 1.015  PHURINE 5.0  GLUCOSEU NEGATIVE  HGBUR NEGATIVE  BILIRUBINUR NEGATIVE  KETONESUR NEGATIVE  PROTEINUR NEGATIVE  NITRITE NEGATIVE  LEUKOCYTESUR NEGATIVE      Imaging: CARDIAC CATHETERIZATION  Result Date: 02/11/2020  Widely patent left main  Proximal LAD 70 to 80% with either thrombus or calcified nodular plaque.  First diagonal 50% narrowed proximally.  First obtuse marginal circumflex and the distal third contains subtotal occlusion which could be an acute culprit lesion.  Functionally occluded right coronary with ostial/sinus of Valsalva aneurysm.  Left-to-right collaterals from LAD to PDA around the apex.  Severely elevated LVEDP consistent with acute on chronic  combined systolic and diastolic heart failure.  Total contrast 27 cc RECOMMENDATIONS:  Track creatinine  We will hydrate but patient will be at risk for heart failure.  Best therapy for CAD is medical management.    Medications:   . sodium chloride    . ferric gluconate (FERRLECIT/NULECIT) IV 250 mg (02/11/20 1335)  . heparin 1,000 Units/hr (02/12/20 0630)   . arformoterol  15 mcg Nebulization BID  . aspirin EC  81 mg Oral Daily  . atorvastatin  80 mg Oral Daily  . escitalopram  20 mg Oral QHS  . furosemide  40 mg Oral Daily  . hydrALAZINE  10 mg Oral TID  . lamoTRIgine  150 mg Oral QHS  . metoprolol tartrate  25 mg Oral BID  . multivitamin with minerals  1 tablet Oral QHS  . pantoprazole  40 mg Oral Daily  . sodium chloride flush  3 mL Intravenous Q12H  . umeclidinium bromide  1 puff Inhalation Daily   sodium chloride, acetaminophen, albuterol, nitroGLYCERIN,  ondansetron (ZOFRAN) IV, oxyCODONE, polyethylene glycol, sodium chloride flush  Assessment/ Plan:  1.Acute kidney injury with baseline serum creatinine within normal range.  Noted hypotension with systolic blood pressures less than 100 mmHg 02/07/2020 02/08/2020.  I suspect this is the etiology of his acute tubular necrosis.    Renal function appears to be improving.  Ultrasound did not reveal any evidence of hydronephrosis.  Urinalysis was bland.  Status post cardiac catheterization 02/11/2020. 2. Hypertension/volume  -appears to be stable euvolemic did receive IV Lasix.  Good urine output.  Ultrasound does reveal some effusions and ascites.  Restarted home dose Lasix. 3.  Anemia we will continue to follow stable at this point no intervention needed.  Iron sats 4% we will give IV iron 4.  Coronary artery disease status post NSTEMI.    We will continue to follow renal function status post cardiac catheterization 02/11/2020.   Avoid hypotension if possible.  IV heparin. 5.  Bones acute kidney injury Baseline creatinine within normal  range.  We will continue to follow 6.  GERD patient using Protonix I seriously doubt this is causing any acute interstitial nephritis.  7 mild hypercalcemia noted we will continue to follow does not appear to be on any hypercalcemia promoting medications.  Chest x-ray negative for any pulmonary lesions   LOS: 5 Sherril Croon @TODAY @8 :30 AM

## 2020-02-13 LAB — RENAL FUNCTION PANEL
Albumin: 3.5 g/dL (ref 3.5–5.0)
Anion gap: 13 (ref 5–15)
BUN: 67 mg/dL — ABNORMAL HIGH (ref 8–23)
CO2: 21 mmol/L — ABNORMAL LOW (ref 22–32)
Calcium: 10.4 mg/dL — ABNORMAL HIGH (ref 8.9–10.3)
Chloride: 100 mmol/L (ref 98–111)
Creatinine, Ser: 1.59 mg/dL — ABNORMAL HIGH (ref 0.61–1.24)
GFR calc Af Amer: 45 mL/min — ABNORMAL LOW (ref >60)
GFR calc non Af Amer: 39 mL/min — ABNORMAL LOW (ref >60)
Glucose, Bld: 146 mg/dL — ABNORMAL HIGH (ref 70–99)
Phosphorus: 3.6 mg/dL (ref 2.5–4.6)
Potassium: 4.4 mmol/L (ref 3.5–5.1)
Sodium: 134 mmol/L — ABNORMAL LOW (ref 135–145)

## 2020-02-13 LAB — CBC
HCT: 30.6 % — ABNORMAL LOW (ref 39.0–52.0)
Hemoglobin: 9.3 g/dL — ABNORMAL LOW (ref 13.0–17.0)
MCH: 28.3 pg (ref 26.0–34.0)
MCHC: 30.4 g/dL (ref 30.0–36.0)
MCV: 93 fL (ref 80.0–100.0)
Platelets: 223 10*3/uL (ref 150–400)
RBC: 3.29 MIL/uL — ABNORMAL LOW (ref 4.22–5.81)
RDW: 16.4 % — ABNORMAL HIGH (ref 11.5–15.5)
WBC: 9.1 10*3/uL (ref 4.0–10.5)
nRBC: 0.2 % (ref 0.0–0.2)

## 2020-02-13 MED ORDER — FUROSEMIDE 40 MG PO TABS
40.0000 mg | ORAL_TABLET | Freq: Every day | ORAL | Status: DC
  Administered 2020-02-13 – 2020-02-14 (×2): 40 mg via ORAL
  Filled 2020-02-13 (×3): qty 1

## 2020-02-13 MED ORDER — DAPAGLIFLOZIN PROPANEDIOL 10 MG PO TABS
10.0000 mg | ORAL_TABLET | Freq: Every day | ORAL | Status: DC
  Administered 2020-02-13 – 2020-02-17 (×5): 10 mg via ORAL
  Filled 2020-02-13 (×5): qty 1

## 2020-02-13 NOTE — Evaluation (Signed)
Physical Therapy Evaluation Patient Details Name: Eduardo [name] MRN: [id number] DOB: April 21, 1931 Today's Date: 02/13/2020   History of Present Illness  86 yo male with onset of NSTEMI was admitted, had L heart cath on 02/11/20, noted CHF and atherosclerosis with LAD, occluded R coronary artery.  EF 30-35%, AKI, deconditioned.  Ischemic cardiomyopathy, PMHx:  CAD, a-fib, CHF, COPD, HLD, HTN,   Clinical Impression  Pt is up to side of bed and then sidestepping, noted his buckling appearance of knees.  With RN was able to take several walks with pt on and off O2, noting decline to 88% with last walk off O2.  Pt will be given SNF recommendation, and will be requiring O2 at least initially.  Follow up with him as needed and work toward greater standing balance and endurance to shorten stay in rehab.  Pt is motivated and has very good support from his wife.    Follow Up Recommendations SNF    Equipment Recommendations  Rolling walker with 5" wheels    Recommendations for Other Services       Precautions / Restrictions Precautions Precautions: Fall Precaution Comments: monitor O2 sats and HR Restrictions Weight Bearing Restrictions: No      Mobility  Bed Mobility Overal bed mobility: Needs Assistance Bed Mobility: Supine to Sit;Sit to Supine     Supine to sit: Min assist Sit to supine: Supervision   General bed mobility comments: pt is quite mobile x mod assist to scoot up in bed  Transfers Overall transfer level: Needs assistance Equipment used: Rolling walker (2 wheeled);1 person hand held assist Transfers: Sit to/from Stand Sit to Stand: Min assist         General transfer comment: min assist consistently to power up  Ambulation/Gait Ambulation/Gait assistance: Min guard;+2 physical assistance;+2 safety/equipment Gait Distance (Feet): 46 Feet (15 x 2, 8 x 2) Assistive device: Rolling walker (2 wheeled);2 person hand held assist Gait Pattern/deviations: Step-through  pattern;Step-to pattern;Decreased stride length;Wide base of support Gait velocity: reduced Gait velocity interpretation: <1.31 ft/sec, indicative of household ambulator General Gait Details: pt is SOB during part of gait where O2 is off, quickly fatigues  Stairs Stairs:  (deferred)          Wheelchair Mobility    Modified Rankin (Stroke Patients Only)       Balance Overall balance assessment: Needs assistance Sitting-balance support: Feet supported Sitting balance-Leahy Scale: Fair Sitting balance - Comments: pt is impulsive in sitting and unsupported needs continual supervision to avoid standing unsafely Postural control: Posterior lean;Left lateral lean Standing balance support: Bilateral upper extremity supported;During functional activity Standing balance-Leahy Scale: Poor                               Pertinent Vitals/Pain Pain Assessment: Faces Faces Pain Scale: No hurt    Home Living Family/patient expects to be discharged to:: Private residence Living Arrangements: Spouse/significant other Available Help at Discharge: Family;Available 24 hours/day Type of Home: House       Home Layout: One level Home Equipment: Cane - single point Additional Comments: was walking without help until recently    Prior Function Level of Independence: Independent with assistive device(s)               Hand Dominance   Dominant Hand: Right    Extremity/Trunk Assessment   Upper Extremity Assessment Upper Extremity Assessment: Generalized weakness    Lower Extremity Assessment Lower Extremity Assessment: Generalized  weakness    Cervical / Trunk Assessment Cervical / Trunk Assessment: Kyphotic  Communication   Communication: HOH  Cognition Arousal/Alertness: Awake/alert Behavior During Therapy: Impulsive Overall Cognitive Status: History of cognitive impairments - at baseline                                 General Comments: per  wife pt is cognitively at baseline      General Comments General comments (skin integrity, edema, etc.): Pt is on 2L O2, at baseline was 98% sat down to 93% walking on air.  At removal of cannula was down to 88% walking.    Exercises     Assessment/Plan    PT Assessment Patient needs continued PT services  PT Problem List Decreased strength;Decreased range of motion;Decreased activity tolerance;Decreased mobility;Decreased balance;Decreased coordination;Decreased cognition;Decreased knowledge of use of DME;Decreased safety awareness;Cardiopulmonary status limiting activity       PT Treatment Interventions DME instruction;Gait training;Stair training;Functional mobility training;Therapeutic activities;Therapeutic exercise;Balance training;Neuromuscular re-education;Patient/family education    PT Goals (Current goals can be found in the Care Plan section)  Acute Rehab PT Goals Patient Stated Goal: to use [facility] PT Goal Formulation: With patient/family Time For Goal Achievement: 02/27/20 Potential to Achieve Goals: Good    Frequency Min 3X/week   Barriers to discharge Inaccessible home environment;Decreased caregiver support one person assist with wife, desaturating    Co-evaluation               AM-PAC PT "6 Clicks" Mobility  Outcome Measure Help needed turning from your back to your side while in a flat bed without using bedrails?: A Little Help needed moving from lying on your back to sitting on the side of a flat bed without using bedrails?: A Little Help needed moving to and from a bed to a chair (including a wheelchair)?: A Little Help needed standing up from a chair using your arms (e.g., wheelchair or bedside chair)?: A Little Help needed to walk in hospital room?: A Lot Help needed climbing 3-5 steps with a railing? : Total 6 Click Score: 15    End of Session Equipment Utilized During Treatment: Oxygen;Gait belt Activity Tolerance: Patient tolerated treatment  well;Treatment limited secondary to medical complications (Comment) Patient left: in bed;with call bell/phone within reach;with bed alarm set;with family/visitor present;with nursing/sitter in room Nurse Communication: Mobility status PT Visit Diagnosis: Unsteadiness on feet (R26.81);Muscle weakness (generalized) (M62.81);Difficulty in walking, not elsewhere classified (R26.2)    Time: 8828-0034 PT Time Calculation (min) (ACUTE ONLY): 44 min   Charges:   PT Evaluation $PT Eval Moderate Complexity: 1 Mod PT Treatments $Gait Training: 8-22 mins $Therapeutic Activity: 8-22 mins       Ramond Dial 02/13/2020, 5:50 PM  Mee Hives, PT MS Acute Rehab Dept. Number: [place] and [place]

## 2020-02-13 NOTE — Progress Notes (Signed)
SATURATION QUALIFICATIONS: (This note is used to comply with regulatory documentation for home oxygen)  Patient Saturations on Room Air at Rest =   93 %  Patient Saturations on Room Air while Ambulating = 88 %  Patient Saturations on 2 Liters of oxygen while Ambulating = 97 %  Please briefly explain why patient needs home oxygen: pt was short of breath while ambulating off oxygen. While pt O2 saturation was at 97 % on 2 Liters , pt contiued to be short of breath while ambulating . Had asked for " few minutes "  To catch  His breath .

## 2020-02-13 NOTE — Progress Notes (Signed)
Progress Note  Patient Name: Eduardo [name] Date of Encounter: 02/13/2020  [facility] Cardiologist: Ida Rogue, MD   Subjective   Spoke to wife [name], she feels he is improving. Patient agrees.  Inpatient Medications    Scheduled Meds: . apixaban  2.5 mg Oral BID  . arformoterol  15 mcg Nebulization BID  . aspirin EC  81 mg Oral Daily  . atorvastatin  80 mg Oral Daily  . escitalopram  20 mg Oral QHS  . furosemide  40 mg Intravenous BID  . hydrALAZINE  10 mg Oral TID  . lamoTRIgine  150 mg Oral QHS  . metoprolol succinate  50 mg Oral Daily  . multivitamin with minerals  1 tablet Oral QHS  . pantoprazole  40 mg Oral Daily  . sodium chloride flush  3 mL Intravenous Q12H  . umeclidinium bromide  1 puff Inhalation Daily   Continuous Infusions: . sodium chloride    . ferric gluconate (FERRLECIT/NULECIT) IV 250 mg (02/12/20 1145)   PRN Meds: sodium chloride, acetaminophen, albuterol, nitroGLYCERIN, ondansetron (ZOFRAN) IV, oxyCODONE, polyethylene glycol, sodium chloride flush   Vital Signs    Vitals:   02/12/20 2359 02/13/20 0433 02/13/20 0744 02/13/20 0823  BP: 140/77 135/75 123/81   Pulse: 88 80 80   Resp: 18 18 15    Temp: 98 F (36.7 C) 98.5 F (36.9 C) 98.1 F (36.7 C)   TempSrc: Oral Oral Oral   SpO2: 98% 99% 92% 98%  Weight:  65.2 kg    Height:        Intake/Output Summary (Last 24 hours) at 02/13/2020 0833 Last data filed at 02/13/2020 0300 Gross per 24 hour  Intake 203 ml  Output 510 ml  Net -307 ml   Last 3 Weights 02/13/2020 02/12/2020 02/11/2020  Weight (lbs) 143 lb 11.8 oz 148 lb 2.4 oz 143 lb 11.8 oz  Weight (kg) 65.2 kg 67.2 kg 65.2 kg      Telemetry    Afib, ventricular rates 80s-90s- Personally Reviewed  ECG    No new tracings - Personally Reviewed  Physical Exam   GEN: No acute distress.   Neck: No JVD Cardiac: irregular rhythm, normal rate, no murmurs, rubs, or gallops.  Respiratory: Clear to auscultation bilaterally. GI:  Soft, nontender, non-distended  MS: No edema; No deformity. Neuro:  Nonfocal  Psych: Normal affect    Labs    High Sensitivity Troponin:   Recent Labs  Lab 02/07/20 0127 02/07/20 0333 02/07/20 1206 02/07/20 1535  TROPONINIHS 33* 414* 21,943* >27,000*      Chemistry Recent Labs  Lab 02/10/20 0430 02/11/20 0351 02/12/20 1452  NA 132* 135 134*  K 4.2 4.4 4.4  CL 98 100 99  CO2 21* 25 22  GLUCOSE 109* 129* 140*  BUN 75* 75* 69*  CREATININE 1.83* 1.71* 1.51*  CALCIUM 10.2 10.6* 10.4*  ALBUMIN  --   --  3.4*  GFRNONAA 33* 35* 41*  GFRAA 38* 41* 48*  ANIONGAP 13 10 13      Hematology Recent Labs  Lab 02/10/20 0430 02/11/20 0351 02/12/20 1454  WBC 10.6* 9.5 9.4  RBC 3.39* 3.22* 3.23*  HGB 9.6* 9.2* 9.1*  HCT 31.3* 29.6* 29.9*  MCV 92.3 91.9 92.6  MCH 28.3 28.6 28.2  MCHC 30.7 31.1 30.4  RDW 15.8* 15.9* 16.3*  PLT 193 195 233    BNP Recent Labs  Lab 02/07/20 1206 02/09/20 0330  BNP 802.2* 1,855.9*     DDimer No results for  input(s): DDIMER in the last 168 hours.   Radiology    CARDIAC CATHETERIZATION  Result Date: 02/11/2020  Widely patent left main  Proximal LAD 70 to 80% with either thrombus or calcified nodular plaque.  First diagonal 50% narrowed proximally.  First obtuse marginal circumflex and the distal third contains subtotal occlusion which could be an acute culprit lesion.  Functionally occluded right coronary with ostial/sinus of Valsalva aneurysm.  Left-to-right collaterals from LAD to PDA around the apex.  Severely elevated LVEDP consistent with acute on chronic combined systolic and diastolic heart failure.  Total contrast 27 cc RECOMMENDATIONS:  Track creatinine  We will hydrate but patient will be at risk for heart failure.  Best therapy for CAD is medical management.   Cardiac Studies   Left heart cath 02/11/20:  Widely patent left main  Proximal LAD 70 to 80% with either thrombus or calcified nodular plaque.  First diagonal  50% narrowed proximally.  First obtuse marginal circumflex and the distal third contains subtotal occlusion which could be an acute culprit lesion.  Functionally occluded right coronary with ostial/sinus of Valsalva aneurysm.  Left-to-right collaterals from LAD to PDA around the apex.  Severely elevated LVEDP consistent with acute on chronic combined systolic and diastolic heart failure.  Total contrast 27 cc  RECOMMENDATIONS:   Track creatinine  We will hydrate but patient will be at risk for heart failure.  Best therapy for CAD is medical management.   Echo 02/07/20: 1. Left ventricular ejection fraction, by estimation, is 30 to 35%. The  left ventricle has moderately decreased function. The left ventricle  demonstrates global hypokinesis. There is moderate left ventricular  hypertrophy. Left ventricular diastolic  parameters are consistent with Grade III diastolic dysfunction  (restrictive).  2. Right ventricular systolic function is normal. The right ventricular  size is normal. There is moderately elevated pulmonary artery systolic  pressure. The estimated right ventricular systolic pressure is 57.3 mmHg.  3. Left atrial size was severely dilated.  4. Right atrial size was moderately dilated.  5. The mitral valve is normal in structure. Moderate mitral valve  regurgitation. No evidence of mitral stenosis.  6. The aortic valve is tricuspid. Aortic valve regurgitation is not  visualized. No aortic stenosis is present.  7. Aortic dilatation noted. There is mild dilatation of the ascending  aorta measuring 37 mm.  8. The inferior vena cava is dilated in size with <50% respiratory  variability, suggesting right atrial pressure of 15 mmHg.    Patient Profile     86 y.o. male with medical history ofchronic diastolicheart failure, chronic afib, bilateral carotid artery disase, CAD,COPD, hyperlipidemiaand HTN admitted with NSTEMI.Echo with new low LVEF. Cath  delayed due to worsen renal function.  Assessment & Plan    NSTEMI CAD - cath yesterday with 99% stenosis in the RCA with left to right collaterals, 80% stenosis in the proximal LAD, 99% stenosis in the first marginal felt to be the culprit lesion --> recommended medical management -ASA and BB - also on eliquis. In setting of NSTEMI, continue both ASA and Eliquis at this time if bleeding risk is not prohibitive.  Ischemic cardiomyopathy Chronic systolic heart failure - EF this admission was 30-35%, decline from 2018 echo (55-60%) - elevated LVEDP during heart cath - BNP 802 --> 1856 - diuresis has been complicated by worsening renal function - labs pending today - continue hydralazine, creatinine improving. Heart Failure Therapy ACE-I/ARB/ARNI: on hold due to renal function, would start entresto when able.  BB: metoprolol succinate 50 mg daily  MRA: none due to renal function. SGLT2I: GFR acceptable. Starting Farxiga 10 mg daily. Diuretic: back on furosemide 40 mg IV BID.  Cr: pending UOP yesterday: 1.06 L Weight: 66.5-->67.2-->65.2 kg Net for admission: net even Diuretic plan: transitioned to lasix 40 mg po daily. Monitor Cr.  Chronic Afib - was maintained on eliquis, verapamil as outpt - eliquis restarted, 2.5 mg BID - metoprolol succinate for CHF - hold verapamil, rates controlled. - rates are controlled today  Acute kidney injury - sCr 1.51 yesterday - labs pending today  Hyperlipidemia - home fenofibrate, 80 mg lipitor  Deconditioning - needs PT consult, will likely need rehab at discharge  COPD - continue home inhalers - complicating his dyspnea  - on new O2 req. Discussed weaning strategies with Nurse Rey.     For questions or updates, please contact [place] Please consult [email or web address] for contact info under        Signed, Elouise Munroe, MD  02/13/2020, 8:33 AM

## 2020-02-13 NOTE — Progress Notes (Signed)
[facility] ROUNDING NOTE   Subjective:   86 y.o. male.  With a history of coronary disease congestive heart failure COPD hyperlipidemia.  He presented with an NSTEMI.  He was scheduled for left-sided heart catheterization 02/09/2020 but appears to have a rising serum creatinine.  He had a serum creatinine increase after dye load in 2012.  There appeared to be some variation in blood pressures with some hypotension noted on 8/30 and 02/07/2020.  He was at that time receiving nitroglycerin and this has been discontinued.  His blood pressure has improved.  Renal ultrasound showed bilateral renal cysts however volume was 92 cc on the right kidney and 144 cc on the left kidney there was some right pleural effusion and ascites.  Urine sediment was negative for protein and negative for cells.  His baseline serum creatinine was within normal range  Plan for cardiac catheterization 02/11/2020 I suspect the contrast load will lead to acute kidney injury.  Appreciate assistance of Dr. Daneen Schick.  Blood pressure 122/81 pulse 80 temperature 98.1 O2 sats 98% 2 L  Sodium 134 potassium 4.4 chloride 99 CO2 22 BUN 69 creatinine 1.5 glucose 140 calcium 10.4 hemoglobin 9.1     Objective:  Vital signs in last 24 hours:  Temp:  [97.6 F (36.4 C)-98.6 F (37 C)] 98.1 F (36.7 C) (09/04 0744) Pulse Rate:  [72-88] 80 (09/04 0744) Resp:  [15-18] 15 (09/04 0744) BP: (123-140)/(75-81) 123/81 (09/04 0744) SpO2:  [92 %-99 %] 98 % (09/04 0823) Weight:  [65.2 kg] 65.2 kg (09/04 0433)  Weight change: -2 kg Filed Weights   02/11/20 0429 02/12/20 0500 02/13/20 0433  Weight: 65.2 kg 67.2 kg 65.2 kg    Intake/Output: I/O last 3 completed shifts: In: 1274.1 [P.O.:680; I.V.:594.1] Out: 1160 [Urine:1160]   Intake/Output this shift:  No intake/output data recorded.  CVS- RRR no murmurs rubs gallops RS- CTA clear to auscultation ABD- BS present soft non-distended EXT- no edema   Basic Metabolic  Panel: Recent Labs  Lab 02/08/20 1140 02/08/20 1140 02/09/20 0330 02/09/20 0330 02/10/20 0430 02/11/20 0351 02/12/20 1452  NA 134*  --  133*  --  132* 135 134*  K 4.3  --  4.3  --  4.2 4.4 4.4  CL 100  --  99  --  98 100 99  CO2 23  --  23  --  21* 25 22  GLUCOSE 99  --  125*  --  109* 129* 140*  BUN 50*  --  66*  --  75* 75* 69*  CREATININE 2.15*  --  2.31*  --  1.83* 1.71* 1.51*  CALCIUM 10.1   < > 10.1   < > 10.2 10.6* 10.4*  PHOS  --   --   --   --   --   --  3.8   < > = values in this interval not displayed.    Liver Function Tests: Recent Labs  Lab 02/12/20 1452  ALBUMIN 3.4*   No results for input(s): LIPASE, AMYLASE in the last 168 hours. No results for input(s): AMMONIA in the last 168 hours.  CBC: Recent Labs  Lab 02/08/20 0228 02/09/20 0330 02/10/20 0430 02/11/20 0351 02/12/20 1454  WBC 11.1* 12.4* 10.6* 9.5 9.4  NEUTROABS  --   --   --   --  6.9  HGB 9.9* 10.6* 9.6* 9.2* 9.1*  HCT 32.1* 33.3* 31.3* 29.6* 29.9*  MCV 91.2 92.2 92.3 91.9 92.6  PLT 253 216 193  195 233    Cardiac Enzymes: No results for input(s): CKTOTAL, CKMB, CKMBINDEX, TROPONINI in the last 168 hours.  BNP: Invalid input(s): POCBNP  CBG: No results for input(s): GLUCAP in the last 168 hours.  Microbiology: Results for orders placed or performed during the hospital encounter of 02/07/20  SARS Coronavirus 2 by RT PCR (hospital order, performed in [facility] hospital lab) Nasopharyngeal Nasopharyngeal Swab     Status: None   Collection Time: 02/07/20  5:33 AM   Specimen: Nasopharyngeal Swab  Result Value Ref Range Status   SARS Coronavirus 2 NEGATIVE NEGATIVE Final    Comment: (NOTE) SARS-CoV-2 target nucleic acids are NOT DETECTED.  The SARS-CoV-2 RNA is generally detectable in upper and lower respiratory specimens during the acute phase of infection. The lowest concentration of SARS-CoV-2 viral copies this assay can detect is 250 copies / mL. A negative result does not  preclude SARS-CoV-2 infection and should not be used as the sole basis for treatment or other patient management decisions.  A negative result may occur with improper specimen collection / handling, submission of specimen other than nasopharyngeal swab, presence of viral mutation(s) within the areas targeted by this assay, and inadequate number of viral copies (<250 copies / mL). A negative result must be combined with clinical observations, patient history, and epidemiological information.  Fact Sheet for Patients:   StrictlyIdeas.no  Fact Sheet for Healthcare Providers: BankingDealers.co.za  This test is not yet approved or  cleared by the [place] FDA and has been authorized for detection and/or diagnosis of SARS-CoV-2 by FDA under an Emergency Use Authorization (EUA).  This EUA will remain in effect (meaning this test can be used) for the duration of the COVID-19 declaration under Section 564(b)(1) of the Act, 21 U.S.C. section 360bbb-3(b)(1), unless the authorization is terminated or revoked sooner.  Performed at [facility], [place] [address], [place], [place] [address]     Coagulation Studies: No results for input(s): LABPROT, INR in the last 72 hours.  Urinalysis: No results for input(s): COLORURINE, LABSPEC, PHURINE, GLUCOSEU, HGBUR, BILIRUBINUR, KETONESUR, PROTEINUR, UROBILINOGEN, NITRITE, LEUKOCYTESUR in the last 72 hours.  Invalid input(s): APPERANCEUR    Imaging: CARDIAC CATHETERIZATION  Result Date: 02/11/2020  Widely patent left main  Proximal LAD 70 to 80% with either thrombus or calcified nodular plaque.  First diagonal 50% narrowed proximally.  First obtuse marginal circumflex and the distal third contains subtotal occlusion which could be an acute culprit lesion.  Functionally occluded right coronary with ostial/sinus of Valsalva aneurysm.  Left-to-right collaterals from LAD to PDA around the apex.   Severely elevated LVEDP consistent with acute on chronic combined systolic and diastolic heart failure.  Total contrast 27 cc RECOMMENDATIONS:  Track creatinine  We will hydrate but patient will be at risk for heart failure.  Best therapy for CAD is medical management.    Medications:   . sodium chloride    . ferric gluconate (FERRLECIT/NULECIT) IV 250 mg (02/12/20 1145)   . apixaban  2.5 mg Oral BID  . arformoterol  15 mcg Nebulization BID  . aspirin EC  81 mg Oral Daily  . atorvastatin  80 mg Oral Daily  . escitalopram  20 mg Oral QHS  . furosemide  40 mg Intravenous BID  . hydrALAZINE  10 mg Oral TID  . lamoTRIgine  150 mg Oral QHS  . metoprolol succinate  50 mg Oral Daily  . multivitamin with minerals  1 tablet Oral QHS  . pantoprazole  40 mg Oral Daily  . sodium chloride flush  3 mL Intravenous Q12H  . umeclidinium bromide  1 puff Inhalation Daily   sodium chloride, acetaminophen, albuterol, nitroGLYCERIN, ondansetron (ZOFRAN) IV, oxyCODONE, polyethylene glycol, sodium chloride flush  Assessment/ Plan:  1.Acute kidney injury with baseline serum creatinine within normal range.  Noted hypotension with systolic blood pressures less than 100 mmHg 02/07/2020 02/08/2020.  I suspect this is the etiology of his acute tubular necrosis.    Renal function appears to be improving.  Ultrasound did not reveal any evidence of hydronephrosis.  Urinalysis was bland.  Status post cardiac catheterization 02/11/2020. 2. Hypertension/volume  -appears to be stable euvolemic did receive IV Lasix.  Good urine output.  Ultrasound does reveal some effusions and ascites.  Restarted home dose Lasix. 3.  Anemia we will continue to follow stable at this point no intervention needed.  Iron sats 4% we will give IV iron 4.  Coronary artery disease status post NSTEMI.    We will continue to follow renal function status post cardiac catheterization 02/11/2020.   Avoid hypotension if possible.  IV heparin. 5.  Bones  acute kidney injury Baseline creatinine within normal range.    Creatinine continues to improve 6.  GERD patient using Protonix I seriously doubt this is causing any acute interstitial nephritis.  7 mild hypercalcemia noted we will continue to follow does not appear to be on any hypercalcemia promoting medications.  Chest x-ray negative for any pulmonary lesions  Will sign off please reconsult if help needed.  No follow-up needed with [facility] at this point.   LOS: [place] @TODAY @8 :42 AM

## 2020-02-14 LAB — RENAL FUNCTION PANEL
Albumin: 3.5 g/dL (ref 3.5–5.0)
Anion gap: 11 (ref 5–15)
BUN: 68 mg/dL — ABNORMAL HIGH (ref 8–23)
CO2: 27 mmol/L (ref 22–32)
Calcium: 10.4 mg/dL — ABNORMAL HIGH (ref 8.9–10.3)
Chloride: 98 mmol/L (ref 98–111)
Creatinine, Ser: 1.84 mg/dL — ABNORMAL HIGH (ref 0.61–1.24)
GFR calc Af Amer: 38 mL/min — ABNORMAL LOW (ref >60)
GFR calc non Af Amer: 32 mL/min — ABNORMAL LOW (ref >60)
Glucose, Bld: 126 mg/dL — ABNORMAL HIGH (ref 70–99)
Phosphorus: 3.3 mg/dL (ref 2.5–4.6)
Potassium: 4.2 mmol/L (ref 3.5–5.1)
Sodium: 136 mmol/L (ref 135–145)

## 2020-02-14 LAB — CBC
HCT: 29.5 % — ABNORMAL LOW (ref 39.0–52.0)
Hemoglobin: 9.3 g/dL — ABNORMAL LOW (ref 13.0–17.0)
MCH: 29.6 pg (ref 26.0–34.0)
MCHC: 31.5 g/dL (ref 30.0–36.0)
MCV: 93.9 fL (ref 80.0–100.0)
Platelets: 245 10*3/uL (ref 150–400)
RBC: 3.14 MIL/uL — ABNORMAL LOW (ref 4.22–5.81)
RDW: 16.7 % — ABNORMAL HIGH (ref 11.5–15.5)
WBC: 9.3 10*3/uL (ref 4.0–10.5)
nRBC: 0.3 % — ABNORMAL HIGH (ref 0.0–0.2)

## 2020-02-14 NOTE — TOC Initial Note (Signed)
Transition of Care [facility]) - Initial/Assessment Note    Patient Details  Name: Eduardo [name] MRN: [id number] Date of Birth: 08/30/1930  Transition of Care [facility]) CM/SW Contact:    Bary Castilla, LCSW Phone Number: [phone] 02/14/2020, 11:40 AM  Clinical Narrative:                  CSW met with patient and patient's wife [name]) to discuss PT recommendation of a SNF. Patient was in out of sleep therefore CSW mainly spoke with wife Patient and patient's wife aware of recommendation and in agreement with going to a ST SNF. CSW discussed the SNF process.CSW provided patient's wife with medicare.gov rating list.  [name] gave CSW permission to fax referrals out to: [facility]. [name] informed CSW that she plans to visit several facilities between today and tomorrow.CSW answered questions about the SNF process and the next steps in the process.  [name] will be making the SNF choice therefore please follow up with her via her cell phone.  TOC team will continue to assist with discharge planning needs.   Expected Discharge Plan: Skilled Nursing Facility Barriers to Discharge: Insurance Authorization, SNF Pending bed offer, Continued Medical Work up   Patient Goals and CMS Choice Patient states their goals for this hospitalization and ongoing recovery are:: To be able to go back home CMS Medicare.gov Compare Post Acute Care list provided to:: Patient Represenative (must comment) ([place] -[name])    Expected Discharge Plan and Services Expected Discharge Plan: [place]       Living arrangements for the past 2 months: Single Family Home                                      Prior Living Arrangements/Services Living arrangements for the past 2 months: Single Family Home Lives with:: Significant Other Patient language and need for interpreter reviewed:: Yes        Need for Family Participation in Patient Care:  No (Comment) Care giver support system in place?: Yes (comment)      Activities of Daily Living Home Assistive Devices/Equipment: Walker (specify type) ADL Screening (condition at time of admission) Patient's cognitive ability adequate to safely complete daily activities?: Yes Is the patient deaf or have difficulty hearing?: Yes Does the patient have difficulty seeing, even when wearing glasses/contacts?: Yes Does the patient have difficulty concentrating, remembering, or making decisions?: No Patient able to express need for assistance with ADLs?: Yes Does the patient have difficulty dressing or bathing?: No Independently performs ADLs?: Yes (appropriate for developmental age) Does the patient have difficulty walking or climbing stairs?: Yes Weakness of Legs: Both Weakness of Arms/Hands: None  Permission Sought/Granted Permission sought to share information with : Family Supports Permission granted to share information with : Yes, Verbal Permission Granted  Share Information with NAME: [name]  Permission granted to share info w AGENCY: SNFws  Permission granted to share info w Relationship: Spouse  Permission granted to share info w Contact Information: [phone]  Emotional Assessment Appearance:: Appears stated age Attitude/Demeanor/Rapport: Lethargic Affect (typically observed): Accepting, Appropriate Orientation: : Oriented to Self, Oriented to Place, Oriented to  Time, Oriented to Situation      Admission diagnosis:  NSTEMI (non-ST elevated myocardial infarction) [facility]) [I21.4] Patient Active Problem List   Diagnosis Date Noted  . NSTEMI (non-ST elevated myocardial infarction) ([place])  02/07/2020  . Left flank pain 10/06/2019  . Rib pain on right side 08/03/2019  . Open arm wound, right, subsequent encounter 07/07/2019  . GERD (gastroesophageal reflux disease) 07/02/2019  . CAD (coronary artery disease), native coronary artery 12/28/2017  . Bilateral carotid artery  stenosis 12/04/2017  . Alcohol dependence in remission ([place]) 06/24/2017  . Imbalance 02/05/2017  . Mitral regurgitation 07/17/2016  . Acute kidney failure ([place]) 07/17/2016  . Dyslipidemia 07/14/2016  . Encounter for anticoagulation discussion and counseling 05/03/2015  . Chronic anticoagulation 03/01/2015  . 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 TIA (transient ischemic attack) 12/31/2014  . Advanced directives, counseling/discussion 04/19/2014  . Spinal stenosis, lumbar region, with neurogenic claudication 12/23/2012  . Osteoarthritis, multiple sites 10/08/2012  . Preventative health care 02/25/2012  . MI, old with prior NSTEMI x 2 12/28/2011  . Chronic diastolic HF (heart failure) ([place]) 12/28/2011    Class: Acute  . Edema 09/20/2010  . Allergic rhinitis 11/11/2008  . Mood disorder ([place]) 04/14/2008  . Essential hypertension 04/14/2008  . Atrial fibrillation ([place]) 04/14/2008  . COPD GOLD II B 04/14/2008   PCP:  Venia Carbon, MD Pharmacy:   [facility] # [address], [place] [address] [place] [address] Phone: [phone] Fax: [phone]  [place], [place] - [place] [address] Phone: [phone] Fax: [phone]  [id number] [place] [id number]: [phone]: [phone]    Social Determinants of Health (SDOH) Interventions    Readmission Risk Interventions No flowsheet data found.

## 2020-02-14 NOTE — NC FL2 (Signed)
[place] LEVEL OF CARE SCREENING TOOL     IDENTIFICATION  Patient Name: Eduardo [name] Birthdate: 1930-07-08 Sex: male Admission Date (Current Location): 02/07/2020  [facility] and [place] Number:  Herbalist and Address:  [facility], [place] [address], [place], [place] [address]      Provider Number: [id number]  Attending Physician Name and Address:  Elouise Munroe, MD  Relative Name and Phone Number:  [name] [phone]    Current Level of Care: Hospital Recommended Level of Care: [place] Prior Approval Number:    Date Approved/Denied:   PASRR Number: Pending  Discharge Plan: SNF    Current Diagnoses: Patient Active Problem List   Diagnosis Date Noted  . NSTEMI (non-ST elevated myocardial infarction) ([place]) 02/07/2020  . Left flank pain 10/06/2019  . Rib pain on right side 08/03/2019  . Open arm wound, right, subsequent encounter 07/07/2019  . GERD (gastroesophageal reflux disease) 07/02/2019  . CAD (coronary artery disease), native coronary artery 12/28/2017  . Bilateral carotid artery stenosis 12/04/2017  . Alcohol dependence in remission ([place]) 06/24/2017  . Imbalance 02/05/2017  . Mitral regurgitation 07/17/2016  . Acute kidney failure ([place]) 07/17/2016  . Dyslipidemia 07/14/2016  . Encounter for anticoagulation discussion and counseling 05/03/2015  . Chronic anticoagulation 03/01/2015  . 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 TIA (transient ischemic attack) 12/31/2014  . Advanced directives, counseling/discussion 04/19/2014  . Spinal stenosis, lumbar region, with neurogenic claudication 12/23/2012  . Osteoarthritis, multiple sites 10/08/2012  . Preventative health care 02/25/2012  . MI, old with prior NSTEMI x 2 12/28/2011  . Chronic diastolic HF (heart failure) ([place]) 12/28/2011  . Edema 09/20/2010  . Allergic rhinitis 11/11/2008  . Mood disorder ([place]) 04/14/2008  .  Essential hypertension 04/14/2008  . Atrial fibrillation ([place]) 04/14/2008  . COPD GOLD II B 04/14/2008    Orientation RESPIRATION BLADDER Height & Weight     Self, Time, Situation, Place  O2 (2L) Continent Weight: 142 lb 13.7 oz (64.8 kg) Height:  5\' 2"  (157.5 cm)  BEHAVIORAL SYMPTOMS/MOOD NEUROLOGICAL BOWEL NUTRITION STATUS      Continent Diet (See discharge summary)  AMBULATORY STATUS COMMUNICATION OF NEEDS Skin   Extensive Assist Verbally Skin abrasions (Left leg)                       Personal Care Assistance Level of Assistance  Bathing, Feeding, Dressing Bathing Assistance: Limited assistance Feeding assistance: Independent Dressing Assistance: Limited assistance     Functional Limitations Info  Sight, Hearing, Speech Sight Info: Adequate Hearing Info: Impaired Speech Info: Adequate    SPECIAL CARE FACTORS FREQUENCY  PT (By licensed PT), OT (By licensed OT)     PT Frequency: 5X per week OT Frequency: 5X per week            Contractures Contractures Info: Not present    Additional Factors Info  Psychotropic, Code Status, Allergies Code Status Info: Full Allergies Info: Penicillins, Sulfonamide Derivatives, Tape Psychotropic Info: Lexapro         Current Medications (02/14/2020):  This is the current hospital active medication list Current Facility-Administered Medications  Medication Dose Route Frequency Provider Last Rate Last Admin  . 0.9 %  sodium chloride infusion  250 mL Intravenous PRN Belva Crome, MD      . acetaminophen (TYLENOL) tablet 650 mg  650 mg Oral Q4H PRN Belva Crome, MD      .  albuterol (PROVENTIL) (2.5 MG/3ML) 0.083% nebulizer solution 2.5 mg  2.5 mg Nebulization Q6H PRN Belva Crome, MD      . apixaban Arne Cleveland) tablet 2.5 mg  2.5 mg Oral BID Burnell Blanks, MD   2.5 mg at 02/14/20 0930  . arformoterol (BROVANA) nebulizer solution 15 mcg  15 mcg Nebulization BID Belva Crome, MD   15 mcg at 02/14/20 0818  .  aspirin EC tablet 81 mg  81 mg Oral Daily Belva Crome, MD   81 mg at 02/14/20 0930  . atorvastatin (LIPITOR) tablet 80 mg  80 mg Oral Daily Belva Crome, MD   80 mg at 02/14/20 0930  . dapagliflozin propanediol (FARXIGA) tablet 10 mg  10 mg Oral Daily Cherlynn Kaiser A, MD   10 mg at 02/14/20 0928  . escitalopram (LEXAPRO) tablet 20 mg  20 mg Oral QHS Belva Crome, MD   20 mg at 02/13/20 2121  . furosemide (LASIX) tablet 40 mg  40 mg Oral Daily Edrick Oh, MD   40 mg at 02/14/20 0930  . hydrALAZINE (APRESOLINE) tablet 10 mg  10 mg Oral TID Belva Crome, MD   10 mg at 02/14/20 0930  . lamoTRIgine (LAMICTAL) tablet 150 mg  150 mg Oral QHS Belva Crome, MD   150 mg at 02/13/20 2123  . metoprolol succinate (TOPROL-XL) 24 hr tablet 50 mg  50 mg Oral Daily Ledora Bottcher, PA   50 mg at 02/14/20 0930  . multivitamin with minerals tablet 1 tablet  1 tablet Oral QHS Belva Crome, MD   1 tablet at 02/13/20 2120  . nitroGLYCERIN (NITROSTAT) SL tablet 0.4 mg  0.4 mg Sublingual Q5 min PRN Belva Crome, MD      . ondansetron [facility]) injection 4 mg  4 mg Intravenous Q6H PRN Belva Crome, MD   4 mg at 02/07/20 2037  . oxyCODONE (Oxy IR/ROXICODONE) immediate release tablet 5-10 mg  5-10 mg Oral Q4H PRN Belva Crome, MD      . pantoprazole (PROTONIX) EC tablet 40 mg  40 mg Oral Daily Belva Crome, MD   40 mg at 02/14/20 0930  . polyethylene glycol (MIRALAX / GLYCOLAX) packet 17 g  17 g Oral Daily PRN Belva Crome, MD      . sodium chloride flush (NS) 0.9 % injection 3 mL  3 mL Intravenous Q12H Belva Crome, MD   3 mL at 02/14/20 1009  . sodium chloride flush (NS) 0.9 % injection 3 mL  3 mL Intravenous PRN Belva Crome, MD   3 mL at 02/13/20 2128  . umeclidinium bromide (INCRUSE ELLIPTA) 62.5 MCG/INH 1 puff  1 puff Inhalation Daily Belva Crome, MD   1 puff at 02/14/20 3710     Discharge Medications: Please see discharge summary for a list of discharge medications.  Relevant  Imaging Results:  Relevant Lab Results:   Additional Information SSN# [id number] Patient fully vaccinated  Bary Castilla, LCSW

## 2020-02-14 NOTE — Progress Notes (Signed)
Progress Note  Patient Name: Eduardo [name] Date of Encounter: 02/14/2020  [facility] Cardiologist: Ida Rogue, MD   Subjective   Feeling better. Wife Eduardo [name] present, she agrees.  Inpatient Medications    Scheduled Meds: . apixaban  2.5 mg Oral BID  . arformoterol  15 mcg Nebulization BID  . aspirin EC  81 mg Oral Daily  . atorvastatin  80 mg Oral Daily  . dapagliflozin propanediol  10 mg Oral Daily  . escitalopram  20 mg Oral QHS  . furosemide  40 mg Oral Daily  . hydrALAZINE  10 mg Oral TID  . lamoTRIgine  150 mg Oral QHS  . metoprolol succinate  50 mg Oral Daily  . multivitamin with minerals  1 tablet Oral QHS  . pantoprazole  40 mg Oral Daily  . sodium chloride flush  3 mL Intravenous Q12H  . umeclidinium bromide  1 puff Inhalation Daily   Continuous Infusions: . sodium chloride    . ferric gluconate (FERRLECIT/NULECIT) IV 250 mg (02/13/20 0924)   PRN Meds: sodium chloride, acetaminophen, albuterol, nitroGLYCERIN, ondansetron (ZOFRAN) IV, oxyCODONE, polyethylene glycol, sodium chloride flush   Vital Signs    Vitals:   02/13/20 2120 02/14/20 0003 02/14/20 0514 02/14/20 0805  BP: 134/82 132/78  (!) 131/91  Pulse: 77 74 85 83  Resp:  18 18 18   Temp:  98 F (36.7 C) 98.6 F (37 C) 98 F (36.7 C)  TempSrc:  Oral Oral Oral  SpO2:  95% 99% 98%  Weight:   64.8 kg   Height:        Intake/Output Summary (Last 24 hours) at 02/14/2020 0855 Last data filed at 02/14/2020 0514 Gross per 24 hour  Intake 246 ml  Output 402 ml  Net -156 ml   Last 3 Weights 02/14/2020 02/13/2020 02/12/2020  Weight (lbs) 142 lb 13.7 oz 143 lb 11.8 oz 148 lb 2.4 oz  Weight (kg) 64.8 kg 65.2 kg 67.2 kg      Telemetry    Afib, ventricular rates 80s-90s- Personally Reviewed  ECG    No new tracings - Personally Reviewed  Physical Exam   GEN: No acute distress.   Neck: No JVD Cardiac: RRR, no murmurs, rubs, or gallops.  Respiratory: shallow breaths but grossly clear GI:  Soft, nontender, non-distended  MS: No edema; No deformity. Neuro:  Nonfocal  Psych: Normal affect    Labs    High Sensitivity Troponin:   Recent Labs  Lab 02/07/20 0127 02/07/20 0333 02/07/20 1206 02/07/20 1535  TROPONINIHS 33* 414* 21,943* >27,000*      Chemistry Recent Labs  Lab 02/12/20 1452 02/13/20 0932 02/14/20 0643  NA 134* 134* 136  K 4.4 4.4 4.2  CL 99 100 98  CO2 22 21* 27  GLUCOSE 140* 146* 126*  BUN 69* 67* 68*  CREATININE 1.51* 1.59* 1.84*  CALCIUM 10.4* 10.4* 10.4*  ALBUMIN 3.4* 3.5 3.5  GFRNONAA 41* 39* 32*  GFRAA 48* 45* 38*  ANIONGAP 13 13 11      Hematology Recent Labs  Lab 02/12/20 1454 02/13/20 0932 02/14/20 0643  WBC 9.4 9.1 9.3  RBC 3.23* 3.29* 3.14*  HGB 9.1* 9.3* 9.3*  HCT 29.9* 30.6* 29.5*  MCV 92.6 93.0 93.9  MCH 28.2 28.3 29.6  MCHC 30.4 30.4 31.5  RDW 16.3* 16.4* 16.7*  PLT 233 223 245    BNP Recent Labs  Lab 02/07/20 1206 02/09/20 0330  BNP 802.2* 1,855.9*     DDimer No results for  input(s): DDIMER in the last 168 hours.   Radiology    No results found.  Cardiac Studies   Left heart cath 02/11/20:  Widely patent left main  Proximal LAD 70 to 80% with either thrombus or calcified nodular plaque.  First diagonal 50% narrowed proximally.  First obtuse marginal circumflex and the distal third contains subtotal occlusion which could be an acute culprit lesion.  Functionally occluded right coronary with ostial/sinus of Valsalva aneurysm.  Left-to-right collaterals from LAD to PDA around the apex.  Severely elevated LVEDP consistent with acute on chronic combined systolic and diastolic heart failure.  Total contrast 27 cc  RECOMMENDATIONS:   Track creatinine  We will hydrate but patient will be at risk for heart failure.  Best therapy for CAD is medical management.   Echo 02/07/20: 1. Left ventricular ejection fraction, by estimation, is 30 to 35%. The  left ventricle has moderately decreased  function. The left ventricle  demonstrates global hypokinesis. There is moderate left ventricular  hypertrophy. Left ventricular diastolic  parameters are consistent with Grade III diastolic dysfunction  (restrictive).  2. Right ventricular systolic function is normal. The right ventricular  size is normal. There is moderately elevated pulmonary artery systolic  pressure. The estimated right ventricular systolic pressure is 40.9 mmHg.  3. Left atrial size was severely dilated.  4. Right atrial size was moderately dilated.  5. The mitral valve is normal in structure. Moderate mitral valve  regurgitation. No evidence of mitral stenosis.  6. The aortic valve is tricuspid. Aortic valve regurgitation is not  visualized. No aortic stenosis is present.  7. Aortic dilatation noted. There is mild dilatation of the ascending  aorta measuring 37 mm.  8. The inferior vena cava is dilated in size with <50% respiratory  variability, suggesting right atrial pressure of 15 mmHg.    Patient Profile     87 y.o. male with medical history ofchronic diastolicheart failure, chronic afib, bilateral carotid artery disase, CAD,COPD, hyperlipidemiaand HTN admitted with NSTEMI.Echo with new low LVEF. Cath delayed due to worsen renal function.  Assessment & Plan    NSTEMI CAD - cath with 99% stenosis in the RCA with left to right collaterals, 80% stenosis in the proximal LAD, 99% stenosis in the first marginal felt to be the culprit lesion --> recommended medical management -ASA and BB - also on eliquis. In setting of NSTEMI, continue both ASA and Eliquis at this time if bleeding risk is not prohibitive.  Ischemic cardiomyopathy Chronic systolic heart failure - EF this admission was 30-35%, decline from 2018 echo (55-60%) - LVEDP 27 mmHg during cath - BNP 802 --> 1856 - diuresis has been complicated by worsening renal function - continue hydralazine  Heart Failure Therapy ACE-I/ARB/ARNI: on  hold due to renal function, would start entresto when able.  BB: metoprolol succinate 50 mg daily  MRA: none due to renal function. SGLT2I: GFR acceptable. Farxiga 10 mg daily. Cr: 1.51 >> 1.59 >> 1.84 UOP yesterday: 400 mL Weight: 66.5-->67.2-->65.2 kg-->64.8 Net for admission: -284 mL Diuretic plan: transitioned to lasix 40 mg po daily.  Guidance on diuretics from nephrology, appreciate assitance.   Chronic Afib - was maintained on eliquis, verapamil as outpt - eliquis 2.5 mg BID - metoprolol succinate for CHF - hold verapamil, rates controlled.  Acute kidney injury - sCr 1.84 - appreciate guidance from nephrology  Hyperlipidemia - home fenofibrate, 80 mg lipitor  Deconditioning - pending placement  COPD - continue home inhalers - complicating his dyspnea  -  on new O2 req. Discussed weaning strategies.      For questions or updates, please contact [place] Please consult [email or web address] for contact info under        Signed, Elouise Munroe, MD  02/14/2020, 8:55 AM

## 2020-02-15 LAB — RENAL FUNCTION PANEL
Albumin: 3.4 g/dL — ABNORMAL LOW (ref 3.5–5.0)
Anion gap: 11 (ref 5–15)
BUN: 63 mg/dL — ABNORMAL HIGH (ref 8–23)
CO2: 26 mmol/L (ref 22–32)
Calcium: 10.1 mg/dL (ref 8.9–10.3)
Chloride: 96 mmol/L — ABNORMAL LOW (ref 98–111)
Creatinine, Ser: 1.71 mg/dL — ABNORMAL HIGH (ref 0.61–1.24)
GFR calc Af Amer: 41 mL/min — ABNORMAL LOW (ref >60)
GFR calc non Af Amer: 35 mL/min — ABNORMAL LOW (ref >60)
Glucose, Bld: 117 mg/dL — ABNORMAL HIGH (ref 70–99)
Phosphorus: 3.3 mg/dL (ref 2.5–4.6)
Potassium: 4.3 mmol/L (ref 3.5–5.1)
Sodium: 133 mmol/L — ABNORMAL LOW (ref 135–145)

## 2020-02-15 LAB — CBC
HCT: 29.2 % — ABNORMAL LOW (ref 39.0–52.0)
Hemoglobin: 8.8 g/dL — ABNORMAL LOW (ref 13.0–17.0)
MCH: 28.4 pg (ref 26.0–34.0)
MCHC: 30.1 g/dL (ref 30.0–36.0)
MCV: 94.2 fL (ref 80.0–100.0)
Platelets: 236 10*3/uL (ref 150–400)
RBC: 3.1 MIL/uL — ABNORMAL LOW (ref 4.22–5.81)
RDW: 16.8 % — ABNORMAL HIGH (ref 11.5–15.5)
WBC: 9.6 10*3/uL (ref 4.0–10.5)
nRBC: 0 % (ref 0.0–0.2)

## 2020-02-15 MED ORDER — FUROSEMIDE 80 MG PO TABS
80.0000 mg | ORAL_TABLET | Freq: Every day | ORAL | Status: DC
  Administered 2020-02-15 – 2020-02-18 (×4): 80 mg via ORAL
  Filled 2020-02-15 (×3): qty 1

## 2020-02-15 NOTE — TOC Progression Note (Signed)
Transition of Care [facility]) - Progression Note    Patient Details  Name: Eduardo [name] MRN: [id number] Date of Birth: 08-Aug-1930  Transition [facility] Care [facility] CM/SW [place], [place] Phone Number: 02/15/2020, 10:32 AM  Clinical Narrative:     Pending bed offers at the moment. PASSR number is under review. CSW submitted clinicals for review. CSW will need to start insurance authorization once SNF choice is made and patient is getting closer to being medically ready. CSW will provide bed offers to patients spouse [name] once in.  CSW will continue to follow.  Expected Discharge Plan: Skilled Nursing Facility Barriers to Discharge: Ship broker, SNF Pending bed offer, Continued Medical Work up  Expected Discharge Plan and Services Expected Discharge Plan: [place] arrangements for the past 2 months: Single Family Home                                       Social Determinants of Health (SDOH) Interventions    Readmission Risk Interventions No flowsheet data found.

## 2020-02-15 NOTE — Progress Notes (Signed)
Report given to Eduardo [name], Therapist, sports. Patient is alert and oriented x4 resting in bed with call light in reach. A fib on tele but rate controlled. 1L of O2 in place via nasal cannula. SOB noted on exertion. PO Lasix in place. Wife visiting SNF facilities today and will call CM to have referral sent to facility of her choosing. Patient to continue to work with PT.  Hgb 8.8, platelets 236, K+ 4.3, Creat 1.71, phos 3.3 this AM.

## 2020-02-15 NOTE — Progress Notes (Signed)
RE: Eduardo [name]. [place]  Date of Birth: Oct 14, 1930  Date: 02/15/2020  To Whom It May Concern:  Please be advised that the above-named patient will require a short-term nursing home stay - anticipated 30 days or less for rehabilitation and strengthening. The plan is for return home.

## 2020-02-15 NOTE — Progress Notes (Signed)
Progress Note  Patient Name: Eduardo [name] Date of Encounter: 02/15/2020  [facility] Cardiologist: Ida Rogue, MD   Patient Profile     86 y.o. male with medical history ofchronic diastolicheart failure, chronic afib, bilateral carotid artery disase, CAD,COPD, hyperlipidemiaand HTN admitted with NSTEMI.Echo with new low LVEF. Cath>>> LAD 80; OM1 99 OM3 sub ; RCA 99 L.>> R Collaterals--med rx/ no intervention; LVEDP 27 Anemia w Fe sat 4 % >> IV iron CHF gradually improving    Subjective    sob at rest Not able to tell me if it is better or worse than over the last previous days  Inpatient Medications    Scheduled Meds: . apixaban  2.5 mg Oral BID  . arformoterol  15 mcg Nebulization BID  . aspirin EC  81 mg Oral Daily  . atorvastatin  80 mg Oral Daily  . dapagliflozin propanediol  10 mg Oral Daily  . escitalopram  20 mg Oral QHS  . furosemide  40 mg Oral Daily  . hydrALAZINE  10 mg Oral TID  . lamoTRIgine  150 mg Oral QHS  . metoprolol succinate  50 mg Oral Daily  . multivitamin with minerals  1 tablet Oral QHS  . pantoprazole  40 mg Oral Daily  . sodium chloride flush  3 mL Intravenous Q12H  . umeclidinium bromide  1 puff Inhalation Daily   Continuous Infusions: . sodium chloride     PRN Meds: sodium chloride, acetaminophen, albuterol, nitroGLYCERIN, ondansetron (ZOFRAN) IV, oxyCODONE, polyethylene glycol, sodium chloride flush   Vital Signs    Vitals:   02/15/20 0130 02/15/20 0351 02/15/20 0737 02/15/20 0745  BP:   120/79   Pulse:   70   Resp:  18 18   Temp:  98.7 F (37.1 C) (!) 97.4 F (36.3 C)   TempSrc:  Oral Oral   SpO2: 96% 95% 97% 99%  Weight:  64.2 kg    Height:        Intake/Output Summary (Last 24 hours) at 02/15/2020 0753 Last data filed at 02/15/2020 0352 Gross per 24 hour  Intake 1440 ml  Output 1250 ml  Net 190 ml   Last 3 Weights 02/15/2020 02/14/2020 02/13/2020  Weight (lbs) 141 lb 8.6 oz 142 lb 13.7 oz 143 lb 11.8 oz    Weight (kg) 64.2 kg 64.8 kg 65.2 kg      Telemetry  Afib rate controlled 60-70s Personally Reviewed  ECG    No new tracings - Personally Reviewed  Physical Exam   Well developed and nourished in no acute distress HENT normal Neck supple with JVP >8-10 Crackles  Irregularly irregular rate and rhythm with controlled ventricular response, no murmurs or gallops Abd-soft with active BS without hepatomegaly No Clubbing cyanosis edema Skin-warm and dry A & Oriented  Grossly normal sensory and motor function   Labs    High Sensitivity Troponin:   Recent Labs  Lab 02/07/20 0127 02/07/20 0333 02/07/20 1206 02/07/20 1535  TROPONINIHS 33* 414* 21,943* >27,000*      Chemistry Recent Labs  Lab 02/13/20 0932 02/14/20 0643 02/15/20 0312  NA 134* 136 133*  K 4.4 4.2 4.3  CL 100 98 96*  CO2 21* 27 26  GLUCOSE 146* 126* 117*  BUN 67* 68* 63*  CREATININE 1.59* 1.84* 1.71*  CALCIUM 10.4* 10.4* 10.1  ALBUMIN 3.5 3.5 3.4*  GFRNONAA 39* 32* 35*  GFRAA 45* 38* 41*  ANIONGAP 13 11 11      Hematology Recent Labs  Lab 02/13/20 0932 02/14/20 0643 02/15/20 0312  WBC 9.1 9.3 9.6  RBC 3.29* 3.14* 3.10*  HGB 9.3* 9.3* 8.8*  HCT 30.6* 29.5* 29.2*  MCV 93.0 93.9 94.2  MCH 28.3 29.6 28.4  MCHC 30.4 31.5 30.1  RDW 16.4* 16.7* 16.8*  PLT 223 245 236    BNP Recent Labs  Lab 02/09/20 0330  BNP 1,855.9*     DDimer No results for input(s): DDIMER in the last 168 hours.   Radiology    No results found.  Cardiac Studies   Left heart cath 02/11/20:  Widely patent left main  Proximal LAD 70 to 80% with either thrombus or calcified nodular plaque.  First diagonal 50% narrowed proximally.  First obtuse marginal circumflex and the distal third contains subtotal occlusion which could be an acute culprit lesion.  Functionally occluded right coronary with ostial/sinus of Valsalva aneurysm.  Left-to-right collaterals from LAD to PDA around the apex.  Severely elevated LVEDP  consistent with acute on chronic combined systolic and diastolic heart failure.  Total contrast 27 cc  RECOMMENDATIONS:   Track creatinine  We will hydrate but patient will be at risk for heart failure.  Best therapy for CAD is medical management.   Echo 02/07/20: 1. Left ventricular ejection fraction, by estimation, is 30 to 35%. The  left ventricle has moderately decreased function. The left ventricle  demonstrates global hypokinesis. There is moderate left ventricular  hypertrophy. Left ventricular diastolic  parameters are consistent with Grade III diastolic dysfunction  (restrictive).  2. Right ventricular systolic function is normal. The right ventricular  size is normal. There is moderately elevated pulmonary artery systolic  pressure. The estimated right ventricular systolic pressure is 47.6 mmHg.  3. Left atrial size was severely dilated.  4. Right atrial size was moderately dilated.  5. The mitral valve is normal in structure. Moderate mitral valve  regurgitation. No evidence of mitral stenosis.  6. The aortic valve is tricuspid. Aortic valve regurgitation is not  visualized. No aortic stenosis is present.  7. Aortic dilatation noted. There is mild dilatation of the ascending  aorta measuring 37 mm.  8. The inferior vena cava is dilated in size with <50% respiratory  variability, suggesting right atrial pressure of 15 mmHg.      Assessment & Plan     NSTEMI  Afib permanent  Cardiomyopathy ischemic  Acute/chronic renal insufficiency  Anemia acute./chronic  fe def  COPD O2   Decondityioning  SNF recommended    Continue Apixoban  And ASA for NSTEMI and AFib, ? Role for short term clopidogrel - will [facility]  On hydralazine, BP somewhat limiting for uptitration,  ACE/ARB on hold 2/2 renal insufficiency,.  Cr seems to have reached its apogee  Continue oral diuretics--increase to 80//incentive spirometer OOB chair to try to mitigate  weakness and bedrest   Afib on Apixoban , appropriately dosed for age/Cr  Anemia is chronic, Fe def noted 8 yrs ago-- not sure workup is indicated given cardiac comorbidity  Certainly can defer   Endoscopy 2017  AVMS.  On PPI  Will need outpt iron supplementation         Signed, Virl Axe, MD  02/15/2020, 7:53 AM

## 2020-02-16 DIAGNOSIS — I5032 Chronic diastolic (congestive) heart failure: Secondary | ICD-10-CM

## 2020-02-16 LAB — CBC
HCT: 31.8 % — ABNORMAL LOW (ref 39.0–52.0)
Hemoglobin: 9.6 g/dL — ABNORMAL LOW (ref 13.0–17.0)
MCH: 28.8 pg (ref 26.0–34.0)
MCHC: 30.2 g/dL (ref 30.0–36.0)
MCV: 95.5 fL (ref 80.0–100.0)
Platelets: 279 10*3/uL (ref 150–400)
RBC: 3.33 MIL/uL — ABNORMAL LOW (ref 4.22–5.81)
RDW: 17.8 % — ABNORMAL HIGH (ref 11.5–15.5)
WBC: 11.4 10*3/uL — ABNORMAL HIGH (ref 4.0–10.5)
nRBC: 0.2 % (ref 0.0–0.2)

## 2020-02-16 LAB — RENAL FUNCTION PANEL
Albumin: 3.6 g/dL (ref 3.5–5.0)
Anion gap: 13 (ref 5–15)
BUN: 65 mg/dL — ABNORMAL HIGH (ref 8–23)
CO2: 23 mmol/L (ref 22–32)
Calcium: 10.1 mg/dL (ref 8.9–10.3)
Chloride: 96 mmol/L — ABNORMAL LOW (ref 98–111)
Creatinine, Ser: 1.72 mg/dL — ABNORMAL HIGH (ref 0.61–1.24)
GFR calc Af Amer: 41 mL/min — ABNORMAL LOW (ref >60)
GFR calc non Af Amer: 35 mL/min — ABNORMAL LOW (ref >60)
Glucose, Bld: 112 mg/dL — ABNORMAL HIGH (ref 70–99)
Phosphorus: 3.5 mg/dL (ref 2.5–4.6)
Potassium: 4.2 mmol/L (ref 3.5–5.1)
Sodium: 132 mmol/L — ABNORMAL LOW (ref 135–145)

## 2020-02-16 MED ORDER — ISOSORBIDE MONONITRATE ER 30 MG PO TB24
15.0000 mg | ORAL_TABLET | Freq: Every day | ORAL | Status: DC
  Administered 2020-02-16 – 2020-02-18 (×3): 15 mg via ORAL
  Filled 2020-02-16 (×3): qty 1

## 2020-02-16 MED ORDER — FERROUS SULFATE 325 (65 FE) MG PO TABS
325.0000 mg | ORAL_TABLET | Freq: Every day | ORAL | Status: DC
  Administered 2020-02-16 – 2020-02-18 (×3): 325 mg via ORAL
  Filled 2020-02-16 (×3): qty 1

## 2020-02-16 NOTE — TOC Progression Note (Signed)
Transition of Care [facility]) - Progression Note    Patient Details  Name: TORRENCE HAMMACK MRN: [id number] Date of Birth: [birth year]  Transition [facility] Care [facility]) CM/SW [place], [place] Phone Number: 02/16/2020, 11:40 AM  Clinical Narrative:     CSW spoke with spouse [name] to provide SNF bed offers. Patients spouse [name] chose SNF placement for patient at [facility].[place] confirmed SNF bed offer with CSW. CSW started insurance authorization for patient. PASSR number was approved.  Patient has SNF bed at [facility] when medically ready. Insurance authorization has been started. PASSR number approved.  CSW will continue to follow.  Expected Discharge Plan: Skilled Nursing Facility Barriers to Discharge: Ship broker, SNF Pending bed offer, Continued Medical Work up  Expected Discharge Plan and Services Expected Discharge Plan: [place] arrangements for the past 2 months: Single Family Home                                       Social Determinants of Health (SDOH) Interventions    Readmission Risk Interventions No flowsheet data found.

## 2020-02-16 NOTE — Progress Notes (Addendum)
Progress Note  Patient Name: Eduardo [name] Date of Encounter: 02/16/2020  [facility] Cardiologist: Ida Rogue, MD   Subjective   No acute overnight events. No chest pain. No shortness of breath at rest but still has shortness of breath with minimal activity such as going to bedside commode.  Inpatient Medications    Scheduled Meds:  apixaban  2.5 mg Oral BID   arformoterol  15 mcg Nebulization BID   aspirin EC  81 mg Oral Daily   atorvastatin  80 mg Oral Daily   dapagliflozin propanediol  10 mg Oral Daily   escitalopram  20 mg Oral QHS   furosemide  80 mg Oral Daily   hydrALAZINE  10 mg Oral TID   lamoTRIgine  150 mg Oral QHS   metoprolol succinate  50 mg Oral Daily   multivitamin with minerals  1 tablet Oral QHS   pantoprazole  40 mg Oral Daily   sodium chloride flush  3 mL Intravenous Q12H   umeclidinium bromide  1 puff Inhalation Daily   Continuous Infusions:  sodium chloride     PRN Meds: sodium chloride, acetaminophen, albuterol, nitroGLYCERIN, ondansetron (ZOFRAN) IV, oxyCODONE, polyethylene glycol, sodium chloride flush   Vital Signs    Vitals:   02/15/20 2157 02/16/20 0013 02/16/20 0200 02/16/20 0445  BP:  122/78  (!) 144/84  Pulse:  79  74  Resp:  20  19  Temp:  97.6 F (36.4 C)  97.9 F (36.6 C)  TempSrc:  Oral  Oral  SpO2: 95% 94% 96% 97%  Weight:    67.7 kg  Height:        Intake/Output Summary (Last 24 hours) at 02/16/2020 0713 Last data filed at 02/15/2020 2258 Gross per 24 hour  Intake 120 ml  Output 725 ml  Net -605 ml   Last 3 Weights 02/16/2020 02/15/2020 02/14/2020  Weight (lbs) 149 lb 4.8 oz 141 lb 8.6 oz 142 lb 13.7 oz  Weight (kg) 67.722 kg 64.2 kg 64.8 kg      Telemetry    Atrial fibrillation with PVCs - Personally Reviewed  ECG    No new ECG tracing today. - Personally Reviewed  Physical Exam   Physical Exam per MD:  GEN: No acute distress.   Neck: No JVD Cardiac: Irregularly irregular rhythm with normal rate.  No murmurs, rubs, or gallops.  Respiratory: Clear to auscultation bilaterally. No wheezes, rhonchi, or rales. GI: Soft, non-tender, non-distended  MS: No lower extremity edema. No deformity. Neuro:  No focal deficits. Psych: Normal affect.  Labs    High Sensitivity Troponin:   Recent Labs  Lab 02/07/20 0127 02/07/20 0333 02/07/20 1206 02/07/20 1535  TROPONINIHS 33* 414* 21,943* >27,000*      Chemistry Recent Labs  Lab 02/14/20 0643 02/15/20 0312 02/16/20 0436  NA 136 133* 132*  K 4.2 4.3 4.2  CL 98 96* 96*  CO2 27 26 23   GLUCOSE 126* 117* 112*  BUN 68* 63* 65*  CREATININE 1.84* 1.71* 1.72*  CALCIUM 10.4* 10.1 10.1  ALBUMIN 3.5 3.4* 3.6  GFRNONAA 32* 35* 35*  GFRAA 38* 41* 41*  ANIONGAP 11 11 13      Hematology Recent Labs  Lab 02/14/20 0643 02/15/20 0312 02/16/20 0436  WBC 9.3 9.6 11.4*  RBC 3.14* 3.10* 3.33*  HGB 9.3* 8.8* 9.6*  HCT 29.5* 29.2* 31.8*  MCV 93.9 94.2 95.5  MCH 29.6 28.4 28.8  MCHC 31.5 30.1 30.2  RDW 16.7* 16.8* 17.8*  PLT 245  236 279    BNPNo results for input(s): BNP, PROBNP in the last 168 hours.   DDimer No results for input(s): DDIMER in the last 168 hours.   Radiology    No results found.  Cardiac Studies   Echocardiogram 02/07/2020: Impressions:  1. Left ventricular ejection fraction, by estimation, is 30 to 35%. The  left ventricle has moderately decreased function. The left ventricle  demonstrates global hypokinesis. There is moderate left ventricular  hypertrophy. Left ventricular diastolic  parameters are consistent with Grade III diastolic dysfunction  (restrictive).   2. Right ventricular systolic function is normal. The right ventricular  size is normal. There is moderately elevated pulmonary artery systolic  pressure. The estimated right ventricular systolic pressure is 64.4 mmHg.   3. Left atrial size was severely dilated.   4. Right atrial size was moderately dilated.   5. The mitral valve is normal in  structure. Moderate mitral valve  regurgitation. No evidence of mitral stenosis.   6. The aortic valve is tricuspid. Aortic valve regurgitation is not  visualized. No aortic stenosis is present.   7. Aortic dilatation noted. There is mild dilatation of the ascending  aorta measuring 37 mm.   8. The inferior vena cava is dilated in size with <50% respiratory  variability, suggesting right atrial pressure of 15 mmHg.   Comparison(s): Prior images reviewed side by side. Changes from prior  study are noted. The left ventricular function is significantly worse.  Prior ECHO EF WAS NORMAL. _______________  Left Heart Catheterization 02/11/2020: Widely patent left main Proximal LAD 70 to 80% with either thrombus or calcified nodular plaque.  First diagonal 50% narrowed proximally. First obtuse marginal circumflex and the distal third contains subtotal occlusion which could be an acute culprit lesion. Functionally occluded right coronary with ostial/sinus of Valsalva aneurysm.  Left-to-right collaterals from LAD to PDA around the apex. Severely elevated LVEDP consistent with acute on chronic combined systolic and diastolic heart failure. Total contrast 27 cc   Recommendations: Track creatinine We will hydrate but patient will be at risk for heart failure. Best therapy for CAD is medical management.  Diagnostic Dominance: Right  Patient Profile     86 y.o. male with a history of CAD, chronic diastolic CHF, permanent atrial fibrillation on Eliquis, bilateral carotid artery stenosis,  hyperlipidemia, and COPD who presented to the ED on 02/07/2020 for further evaluation of chest pain.  Assessment & Plan    NSTEMI  - High-sensitivity troponin peaked at >27,000.  - Echo showed LVEF of 30-35% with global hypokinesis, moderate LVH, and grade III diastolic dysfunction. Cardiac catheterization on 02/11/2020 (delayed due to AKI) showed 70-80% proximal LAD lesion (either thrombus or calcified nodular  plaque), 99% stenosis of proximal RCA with left to right collaterals, and 99% stenosis in the 1st Mrg vessel felt to be the culprit lesion. Noted to have severely elevated LVEDP on cath. Medical therapy recommended.  - No recurrent angina. - Started on Aspirin 81mg  daily and Toprol-XL 50mg  daily. Home Lipitor 80mg  continued.     Acute on Chronic Combined CHF/ Ischemic cardiomyopathy  - BNP elevated at 802 >> 1,856.  - Echo this admission showed LVEF of 30-35% with global hypokinesis, moderate LVH, grade III diastolic dysfunction, moderate MR, and mildly dilated ascending aorta. EF down from 55-60% on Echo in 2018. LVEDP elevated at 27 mmHg on cath.  - Patient was started on IV Lasix but diuresis complicated by worsening renal function with creatinine peaking at 2.31. Nephrology consulted. Net  negative 551 mL this admission. - Transitioned to PO Lasix 80mg  daily on 02/15/2020. - Continue Toprol-XL 50mg  daily. - Continue Hydralazine 10mg  three times daily.  - Will start Imdur 15mg  daily. - Continue to monitor daily weights, strict I/O's, and renal function.     Chronic Atrial Fibrillation  - Rate controlled.  - Home Verapamil discontinued due to reduced EF and patient started on Toprol-XL 50mg  daily.  - Continue Eliquis 2.5mg  twice daily (low dose given age and renal function).     AKI  - Creatinine 1.07 on admission and peaked at 2.31 on 02/09/2020. Creatinine 1.72 today. - Nephrology consulted. Renal ultrasound showed bilateral renal cysts, mild ascites , and small right pleural effusion but no renal sonographic abnormalities. AKI felt to be due to acute tubular necrosis.  - Continue to monitor renal function closely. - Nephrology has signed off.  Hyperlipidemia  - Lipid panel not checked this admission but LDL 65 in 08/2019.  - Continue home Lipitor 80mg  daily and Fenofibrate.  COPD  Continue home inhalers.  Anemia - Hemoglobin 9.6 today, up from 8.8 yesterday. - Apparently diagnosed  with iron deficiency anemia 8 years ago.  - May need iron supplementation.  Deconditioning  - SNF recommended.   For questions or updates, please contact [place] Please consult [email or web address] for contact info under        Signed, Darreld Mclean, PA-C  02/16/2020, 7:13 AM    Agree with note by Sande Rives, PA-C  Mr. Petillo  is a patient of Dr. Elwyn Reach.  He was admitted with chest pain and heart failure.  He had a heart cath by Dr. Tamala Julian revealing a subtotally occluded RCA with left-to-right collaterals, and moderate LAD disease.  Medical therapy was recommended.  He does have chronic A. fib on Eliquis oral anticoagulation.  Other confounding issues during his hospitalization was acute kidney injury with a baseline creatinine in the 1 range now 1.7.  He seems to turn the corner and was clinically improved this morning.  Plan is for SNF close to his home once a bed is available and an early follow-up with Dr. Rockey Situ.  He is an appropriate medications.  He is not on ACE/ARB because of his renal function.   Lorretta Harp, M.D., [place], [facility], [place] [address] [place], [place]  [address]  [phone] 02/16/2020 10:00 AM

## 2020-02-16 NOTE — Discharge Instructions (Addendum)
Heart Failure Education: 1. Weigh yourself EVERY morning after you go to the bathroom but before you eat or drink anything. Write this number down in a weight log/diary. If you gain 3 pounds overnight or 5 pounds in a week, call the office. 2. Take your medicines as prescribed. If you have concerns about your medications, please call us before you stop taking them.  3. Eat low salt foods--Limit salt (sodium) to 2000 mg per day. This will help prevent your body from holding onto fluid. Read food labels as many processed foods have a lot of sodium, especially canned goods and prepackaged meats. If you would like some assistance choosing low sodium foods, we would be happy to set you up with a nutritionist. 4. Stay as active as you can everyday. Staying active will give you more energy and make your muscles stronger. Start with 5 minutes at a time and work your way up to 30 minutes a day. Break up your activities--do some in the morning and some in the afternoon. Start with 3 days per week and work your way up to 5 days as you can.  If you have chest pain, feel short of breath, dizzy, or lightheaded, STOP. If you don't feel better after a short rest, call 911. If you do feel better, call the office to let us know you have symptoms with exercise. 5. Limit all fluids for the day to less than 2 liters. Fluid includes all drinks, coffee, juice, ice chips, soup, jello, and all other liquids.  Post NSTEMI: NO HEAVY LIFTING X 2 WEEKS. NO SEXUAL ACTIVITY X 2 WEEKS. NO SOAKING BATHS, HOT TUBS, POOLS, ETC., X 7 DAYS.  Radial Site Care Refer to this sheet in the next few weeks. These instructions provide you with information on caring for yourself after your procedure. Your caregiver may also give you more specific instructions. Your treatment has been planned according to current medical practices, but problems sometimes occur. Call your caregiver if you have any problems or questions after your procedure. HOME  CARE INSTRUCTIONS  You may shower the day after the procedure.Remove the bandage (dressing) and gently wash the site with plain soap and water.Gently pat the site dry.   Do not apply powder or lotion to the site.   Do not submerge the affected site in water for 3 to 5 days.   Inspect the site at least twice daily.   Do not flex or bend the affected arm for 24 hours.   No lifting over 5 pounds (2.3 kg) for 5 days after your procedure.   Do not drive home if you are discharged the same day of the procedure. Have someone else drive you.  What to expect:  Any bruising will usually fade within 1 to 2 weeks.   Blood that collects in the tissue (hematoma) may be painful to the touch. It should usually decrease in size and tenderness within 1 to 2 weeks.  SEEK IMMEDIATE MEDICAL CARE IF:  You have unusual pain at the radial site.   You have redness, warmth, swelling, or pain at the radial site.   You have drainage (other than a small amount of blood on the dressing).   You have chills.   You have a fever or persistent symptoms for more than 72 hours.   You have a fever and your symptoms suddenly get worse.   Your arm becomes pale, cool, tingly, or numb.   You have heavy bleeding from the site. Hold  pressure on the site.    Information on my medicine - ELIQUIS (apixaban)  Why was Eliquis prescribed for you? Eliquis was prescribed for you to reduce the risk of a blood clot forming that can cause a stroke if you have a medical condition called atrial fibrillation (a type of irregular heartbeat).  What do You need to know about Eliquis ? Take your Eliquis TWICE DAILY - one tablet in the morning and one tablet in the evening with or without food. If you have difficulty swallowing the tablet whole please discuss with your pharmacist how to take the medication safely.  Take Eliquis exactly as prescribed by your doctor and DO NOT stop taking Eliquis without talking to the  doctor who prescribed the medication.  Stopping may increase your risk of developing a stroke.  Refill your prescription before you run out.  After discharge, you should have regular check-up appointments with your healthcare provider that is prescribing your Eliquis.  In the future your dose may need to be changed if your kidney function or weight changes by a significant amount or as you get older.  What do you do if you miss a dose? If you miss a dose, take it as soon as you remember on the same day and resume taking twice daily.  Do not take more than one dose of ELIQUIS at the same time to make up a missed dose.  Important Safety Information A possible side effect of Eliquis is bleeding. You should call your healthcare provider right away if you experience any of the following: ? Bleeding from an injury or your nose that does not stop. ? Unusual colored urine (red or dark brown) or unusual colored stools (red or black). ? Unusual bruising for unknown reasons. ? A serious fall or if you hit your head (even if there is no bleeding).  Some medicines may interact with Eliquis and might increase your risk of bleeding or clotting while on Eliquis. To help avoid this, consult your healthcare provider or pharmacist prior to using any new prescription or non-prescription medications, including herbals, vitamins, non-steroidal anti-inflammatory drugs (NSAIDs) and supplements.  This website has more information on Eliquis (apixaban): http://www.eliquis.com/eliquis/home

## 2020-02-16 NOTE — Progress Notes (Signed)
Report given to Eduardo [name], Therapist, sports. Patient is alert and oriented x4 resting in bed with call light in reach. 1L of O2 in place via nasal cannula and patient tolerating it well. DOE noted at times. A fib with a BBB on tele (rate controlled). BM noted this AM. Wife [name] will be here this AM by 0730 per patient. She has been looking at [facility] facilities and decision needs to be relayed to CM so that can referrals can be sent out in order to DC patient by tomorrow. WBC 11.4, Hgb 9.6, Na+ 132, K+ 4.2, Creat 1.72 this AM. VSS.

## 2020-02-16 NOTE — Telephone Encounter (Signed)
Pts wife left v/m (DPR signed) that pt is to be admitted to [facility] sometime on 02/16/20. pts wife wanted Dr [name] and Larene Beach CMA to be aware.

## 2020-02-16 NOTE — Progress Notes (Signed)
Physical Therapy Treatment Patient Details Name: Eduardo [name] MRN: [id number] DOB: 07/21/1930 Today's Date: 02/16/2020    History of Present Illness 86 yo male with onset of NSTEMI was admitted, had L heart cath on 02/11/20, noted CHF and atherosclerosis with LAD, occluded R coronary artery.  EF 30-35%, AKI, deconditioned.  Ischemic cardiomyopathy, PMHx:  CAD, a-fib, CHF, COPD, HLD, HTN,     PT Comments    Pt and wife were both eager for pt to get a chance up on his feet.  Initiated session with LE strengthening exercise.  Emphasis otherwise on gait stability and stamina, working on scooting, transfer safety from chairs and the toilet. Pt's SpO2 stay at mid to upper 90's overall with mild dyspnea at times.    Follow Up Recommendations  SNF     Equipment Recommendations  Other (comment) (may need RW instead of his rollator)    Recommendations for Other Services       Precautions / Restrictions Precautions Precautions: Fall Precaution Comments: monitor O2 sats and HR    Mobility  Bed Mobility               General bed mobility comments: OOB in the recliner on arrival  Transfers Overall transfer level: Needs assistance Equipment used: Rolling walker (2 wheeled) Transfers: Sit to/from Stand Sit to Stand: Min assist (mod from lower toilet)         General transfer comment: cues for hand placement and assist to come both forward and boost.  Ambulation/Gait Ambulation/Gait assistance: Min guard;Min assist;+2 safety/equipment Gait Distance (Feet): 30 Feet (then 15 feet and then 35 all with RW, last trial with min) Assistive device: Rolling walker (2 wheeled) Gait Pattern/deviations: Step-through pattern Gait velocity: reduced Gait velocity interpretation: <1.31 ft/sec, indicative of household ambulator General Gait Details: pt notably unsteady at hips and increasingly more at knees as he fatigues.  Cues to stay closer to the RW, postural check and assist for  stability.   Stairs             Wheelchair Mobility    Modified Rankin (Stroke Patients Only)       Balance Overall balance assessment: Needs assistance Sitting-balance support: Feet supported Sitting balance-Leahy Scale: Fair       Standing balance-Leahy Scale: Poor Standing balance comment: reliant on AD or external support.                            Cognition Arousal/Alertness: Awake/alert Behavior During Therapy: WFL for tasks assessed/performed Overall Cognitive Status: History of cognitive impairments - at baseline                                 General Comments: per wife pt is cognitively at baseline      Exercises General Exercises - Lower Extremity Long Arc Quad: AROM;Both;10 reps;Seated Heel Slides: AROM;Strengthening;Both;10 reps;Seated (graded resistance in gross extension) Hip Flexion/Marching: AROM;Strengthening;Both;10 reps;Seated    General Comments General comments (skin integrity, edema, etc.): On 1 L pt's SpO2 stayed in the upper 90's at rest and mid 90's with activity.      Pertinent Vitals/Pain Pain Assessment: No/denies pain    Home Living                      Prior Function            PT Goals (current goals  can now be found in the care plan section) Acute Rehab PT Goals Patient Stated Goal: get better and go home. PT Goal Formulation: With patient/family Time For Goal Achievement: 02/27/20 Potential to Achieve Goals: Good Progress towards PT goals: Progressing toward goals    Frequency    Min 3X/week      PT Plan Current plan remains appropriate    Co-evaluation              AM-PAC PT "6 Clicks" Mobility   Outcome Measure  Help needed turning from your back to your side while in a flat bed without using bedrails?: A Little Help needed moving from lying on your back to sitting on the side of a flat bed without using bedrails?: A Little Help needed moving to and from a bed  to a chair (including a wheelchair)?: A Little Help needed standing up from a chair using your arms (e.g., wheelchair or bedside chair)?: A Little Help needed to walk in hospital room?: A Lot Help needed climbing 3-5 steps with a railing? : Total 6 Click Score: 15    End of Session Equipment Utilized During Treatment: Oxygen Activity Tolerance: Patient tolerated treatment well;Patient limited by fatigue Patient left: in chair;with call bell/phone within reach;with family/visitor present Nurse Communication: Mobility status PT Visit Diagnosis: Muscle weakness (generalized) (M62.81);Other abnormalities of gait and mobility (R26.89);Unsteadiness on feet (R26.81)     Time: 7078-6754 PT Time Calculation (min) (ACUTE ONLY): 25 min  Charges:  $Gait Training: 8-22 mins $Therapeutic Activity: 8-22 mins                     02/16/2020  Eduardo Carne., PT Acute Rehabilitation Services [phone]  (pager) [phone]  (office)   Eduardo [name] 02/16/2020, 4:26 PM

## 2020-02-17 DIAGNOSIS — I4811 Longstanding persistent atrial fibrillation: Secondary | ICD-10-CM

## 2020-02-17 LAB — CBC
HCT: 31.8 % — ABNORMAL LOW (ref 39.0–52.0)
Hemoglobin: 9.4 g/dL — ABNORMAL LOW (ref 13.0–17.0)
MCH: 28.6 pg (ref 26.0–34.0)
MCHC: 29.6 g/dL — ABNORMAL LOW (ref 30.0–36.0)
MCV: 96.7 fL (ref 80.0–100.0)
Platelets: 272 10*3/uL (ref 150–400)
RBC: 3.29 MIL/uL — ABNORMAL LOW (ref 4.22–5.81)
RDW: 18.2 % — ABNORMAL HIGH (ref 11.5–15.5)
WBC: 9.8 10*3/uL (ref 4.0–10.5)
nRBC: 0 % (ref 0.0–0.2)

## 2020-02-17 LAB — RENAL FUNCTION PANEL
Albumin: 3.6 g/dL (ref 3.5–5.0)
Anion gap: 10 (ref 5–15)
BUN: 63 mg/dL — ABNORMAL HIGH (ref 8–23)
CO2: 28 mmol/L (ref 22–32)
Calcium: 10 mg/dL (ref 8.9–10.3)
Chloride: 96 mmol/L — ABNORMAL LOW (ref 98–111)
Creatinine, Ser: 1.87 mg/dL — ABNORMAL HIGH (ref 0.61–1.24)
GFR calc Af Amer: 37 mL/min — ABNORMAL LOW (ref >60)
GFR calc non Af Amer: 32 mL/min — ABNORMAL LOW (ref >60)
Glucose, Bld: 107 mg/dL — ABNORMAL HIGH (ref 70–99)
Phosphorus: 3.6 mg/dL (ref 2.5–4.6)
Potassium: 4 mmol/L (ref 3.5–5.1)
Sodium: 134 mmol/L — ABNORMAL LOW (ref 135–145)

## 2020-02-17 LAB — SARS CORONAVIRUS 2 (TAT 6-24 HRS): SARS Coronavirus 2: NEGATIVE

## 2020-02-17 LAB — GLUCOSE, CAPILLARY: Glucose-Capillary: 104 mg/dL — ABNORMAL HIGH (ref 70–99)

## 2020-02-17 NOTE — Care Management Important Message (Signed)
Important Message  Patient Details  Name: Eduardo [name] MRN: [id number] Date of Birth: 03-17-1931   Medicare Important Message Given:  Yes     Shelda Altes 02/17/2020, 12:15 PM

## 2020-02-17 NOTE — Progress Notes (Signed)
1110-1200 Pt asked me to do education with his wife. He was present but sleeping on and off. Wife stated she will read all materials. Gave CHF booklet and MI booklet. Gave low sodium diets and heart healthy diet. Discussed the zones and when to call MD with signs/symptoms of CHF. Wife stated he has been dealing with CHF a long time. Discussed daily weights and 2000 mg sodium restriction. Wife stated he attended either Pulmonary or Cardiac Rehab at [facility] before. Referral to [place] [facility] 2 to meet requirement for NSTEMI. Wife knows pt will need to compete PT prior to attending. Would like the number listed as his cell to be contacted. j Since pt more appropriate for PT and ed has been done, will sign off. Graylon Good RN BSN 02/17/2020 12:02 PM

## 2020-02-17 NOTE — Telephone Encounter (Signed)
That is good news. I will plan to see him tomorrow then

## 2020-02-17 NOTE — TOC Benefit Eligibility Note (Signed)
Transition of Care [facility]) Benefit Eligibility Note    Patient Details  Name: Eduardo [name] MRN: [id number] Date of Birth: Oct 16, 1930   Medication/Dose: Wilder Glade 10mg  qd  Covered?: No (for exception call [phone])     Prescription Coverage Preferred Pharmacy: [facility] for mail order, any retail pharmacy  Spoke with Person/Company/Phone Number:: [employer] for hta  Additional Notes: alternatives [facility] and [facility] -> [facility] -> cost $40 for 30 day retail $80 for [place] Phone Number: 02/17/2020, 2:41 PM

## 2020-02-17 NOTE — Progress Notes (Addendum)
Progress Note  Patient Name: Eduardo [name] Date of Encounter: 02/17/2020  [facility] Cardiologist: Ida Rogue, MD   Subjective   No acute overnight events. Waiting on insurance approval for SNF. Breathing improved. No chest pain. No complaints this morning.  Inpatient Medications    Scheduled Meds:  apixaban  2.5 mg Oral BID   arformoterol  15 mcg Nebulization BID   aspirin EC  81 mg Oral Daily   atorvastatin  80 mg Oral Daily   dapagliflozin propanediol  10 mg Oral Daily   escitalopram  20 mg Oral QHS   ferrous sulfate  325 mg Oral Q breakfast   furosemide  80 mg Oral Daily   hydrALAZINE  10 mg Oral TID   isosorbide mononitrate  15 mg Oral Daily   lamoTRIgine  150 mg Oral QHS   metoprolol succinate  50 mg Oral Daily   multivitamin with minerals  1 tablet Oral QHS   pantoprazole  40 mg Oral Daily   sodium chloride flush  3 mL Intravenous Q12H   umeclidinium bromide  1 puff Inhalation Daily   Continuous Infusions:  sodium chloride     PRN Meds: sodium chloride, acetaminophen, albuterol, nitroGLYCERIN, ondansetron (ZOFRAN) IV, oxyCODONE, polyethylene glycol, sodium chloride flush   Vital Signs    Vitals:   02/16/20 2030 02/16/20 2307 02/17/20 0030 02/17/20 0359  BP:  138/89  112/71  Pulse:  81  78  Resp:  17  18  Temp:  97.8 F (36.6 C)  98.2 F (36.8 C)  TempSrc:  Oral  Oral  SpO2: 97% 93% 95% 90%  Weight:    68.7 kg  Height:        Intake/Output Summary (Last 24 hours) at 02/17/2020 0754 Last data filed at 02/17/2020 0110 Gross per 24 hour  Intake 363 ml  Output 875 ml  Net -512 ml   Last 3 Weights 02/17/2020 02/16/2020 02/15/2020  Weight (lbs) 151 lb 7.3 oz 149 lb 4.8 oz 141 lb 8.6 oz  Weight (kg) 68.7 kg 67.722 kg 64.2 kg      Telemetry    Atrial fibrillation with PVCs. Rates in the 70's to 80's. - Personally Reviewed  ECG    No new ECG tracing. - Personally Reviewed  Physical Exam   GEN: No acute distress.   Neck:  Supple. No JVD Cardiac: Irregular rhythm with normal rate. Possible soft systolic murmur. No rubs or gallops.  Respiratory: No increased work of breathing. Very faint bibasilar crackles (possible atelectasis). but lungs otherwise clear. GI: Soft, non-tender, non-distended  MS: No lower extremity edema. No deformity. Skin: Warm and dry. Neuro:  No focal deficits. Psych: Normal affect. Responds appropriately.  Labs    High Sensitivity Troponin:   Recent Labs  Lab 02/07/20 0127 02/07/20 0333 02/07/20 1206 02/07/20 1535  TROPONINIHS 33* 414* 21,943* >27,000*      Chemistry Recent Labs  Lab 02/14/20 0643 02/15/20 0312 02/16/20 0436  NA 136 133* 132*  K 4.2 4.3 4.2  CL 98 96* 96*  CO2 27 26 23   GLUCOSE 126* 117* 112*  BUN 68* 63* 65*  CREATININE 1.84* 1.71* 1.72*  CALCIUM 10.4* 10.1 10.1  ALBUMIN 3.5 3.4* 3.6  GFRNONAA 32* 35* 35*  GFRAA 38* 41* 41*  ANIONGAP 11 11 13      Hematology Recent Labs  Lab 02/15/20 0312 02/16/20 0436 02/17/20 0630  WBC 9.6 11.4* 9.8  RBC 3.10* 3.33* 3.29*  HGB 8.8* 9.6* 9.4*  HCT 29.2* 31.8*  31.8*  MCV 94.2 95.5 96.7  MCH 28.4 28.8 28.6  MCHC 30.1 30.2 29.6*  RDW 16.8* 17.8* 18.2*  PLT 236 279 272    BNPNo results for input(s): BNP, PROBNP in the last 168 hours.   DDimer No results for input(s): DDIMER in the last 168 hours.   Radiology    No results found.  Cardiac Studies   Echocardiogram 02/07/2020: Impressions: 1. Left ventricular ejection fraction, by estimation, is 30 to 35%. The  left ventricle has moderately decreased function. The left ventricle  demonstrates global hypokinesis. There is moderate left ventricular  hypertrophy. Left ventricular diastolic  parameters are consistent with Grade III diastolic dysfunction  (restrictive).  2. Right ventricular systolic function is normal. The right ventricular  size is normal. There is moderately elevated pulmonary artery systolic  pressure. The estimated right  ventricular systolic pressure is 32.4 mmHg.  3. Left atrial size was severely dilated.  4. Right atrial size was moderately dilated.  5. The mitral valve is normal in structure. Moderate mitral valve  regurgitation. No evidence of mitral stenosis.  6. The aortic valve is tricuspid. Aortic valve regurgitation is not  visualized. No aortic stenosis is present.  7. Aortic dilatation noted. There is mild dilatation of the ascending  aorta measuring 37 mm.  8. The inferior vena cava is dilated in size with <50% respiratory  variability, suggesting right atrial pressure of 15 mmHg.   Comparison(s): Prior images reviewed side by side. Changes from prior  study are noted. The left ventricular function is significantly worse.  Prior ECHO EF WAS NORMAL. _______________  Left Heart Catheterization 02/11/2020:  Widely patent left main  Proximal LAD 70 to 80% with either thrombus or calcified nodular plaque. First diagonal 50% narrowed proximally.  First obtuse marginal circumflex and the distal third contains subtotal occlusion which could be an acute culprit lesion.  Functionally occluded right coronary with ostial/sinus of Valsalva aneurysm. Left-to-right collaterals from LAD to PDA around the apex.  Severely elevated LVEDP consistent with acute on chronic combined systolic and diastolic heart failure.  Total contrast 27 cc  Recommendations:  Track creatinine  We will hydrate but patient will be at risk for heart failure.  Best therapy for CAD is medical management.  Diagnostic Dominance: Right   Patient Profile     86 y.o. male with a history of CAD, chronic diastolic CHF, permanent atrial fibrillation on Eliquis, bilateral carotid artery stenosis, hyperlipidemia, and COPD who presented to the ED on 02/07/2020 for further evaluation of chest pain.  Assessment & Plan    NSTEMI  - High-sensitivity troponin peaked at >27,000.  - Echo showed LVEF of 30-35% with global  hypokinesis, moderate LVH, and grade III diastolic dysfunction. Cardiac catheterization on 02/11/2020 (delayed due to AKI) showed 70-80% proximal LAD lesion (either thrombus or calcified nodular plaque), 99% stenosis of proximal RCA with left to right collaterals, and 99% stenosis in the 1st Mrg vessel felt to be the culprit lesion. Noted to have severely elevated LVEDP on cath. Medical therapy recommended.  - No recurrent angina.  - Started on Aspirin 81mg  daily and Toprol-XL 50mg  daily. Home Lipitor 80mg  continued.    Acute on Chronic Combined CHF/ Ischemic cardiomyopathy  - BNP elevated at 802 >> 1,856.  - Echo this admission showed LVEF of 30-35% with global hypokinesis, moderate LVH, grade III diastolic dysfunction, moderate MR, and mildly dilated ascending aorta. EF down from 55-60% on Echo in 2018. LVEDP elevated at 27 mmHg on cath.  -  Patient was started on IV Lasix but diuresis complicated by worsening renal function with creatinine peaking at 2.31. Nephrology consulted. Net negative 1.36 L this admission. Weight up 2 lbs from yesterday and 13 lbs since admission (unsure if documented weights are accurate). - Transitioned to PO Lasix 80mg  daily on 02/15/2020. - Continue Toprol-XL 50mg  daily. - Continue Hydralazine 10mg  three times daily.  - Imdur 15mg  daily started yesterday. - Continue to monitor daily weights, strict I/O's, and renal function.  - Still on 1L of O2 via nasal cannula. Will need O2 at discharge.   Chronic Atrial Fibrillation  - Rate controlled.  - Home Verapamil discontinued due to reduced EF and patient started on Toprol-XL 50mg  daily.  - Continue Eliquis 2.5mg  twice daily (low dose given age and renal function).    AKI  - Creatinine 1.07 on admission and peaked at 2.31 on 02/09/2020. Creatinine 1.87 today, up from 1.72 yesterday. - Nephrology consulted. Renal ultrasound showed bilateral renal cysts, mild ascites , and small right pleural effusion but no renal  sonographic abnormalities. AKI felt to be due to acute tubular necrosis which was felt to be caused by hypotension. - Continue to monitor renal function closely. - Nephrology has signed off. They did not feel like any follow-up was needed at this at this time.  Hyperlipidemia  - Lipid panel not checked this admission but LDL 65 in 08/2019.  - Continue home Lipitor 80mg  daily and Fenofibrate.  COPD  - Continue home inhalers.  Anemia - Hemoglobin stable at 9.4 today. - Apparently diagnosed with iron deficiency anemia 8 years ago.  - Started Iron supplement.  Deconditioning  - SNF recommended.  Disposition: Waiting on insurance to approve SNF bed.  For questions or updates, please contact [place] Please consult [email or web address] for contact info under        Signed, Darreld Mclean, PA-C  02/17/2020, 7:54 AM     Agree with note by Sande Rives, PA-C  Mr. Hearn  is stable for discharge home today.  He appears euvolemic.  His serum creatinine is mildly elevated.  He is chronic A. fib on Eliquis oral anticoagulation.  Were awaiting SNF authorization for discharge.  Case management is working on this.  He will follow up with Dr. Rockey Situ as an outpatient.   Lorretta Harp, M.D., [place], [facility], [place] [address] [place], [place]  [address]  [phone] 02/17/2020 9:50 AM

## 2020-02-17 NOTE — TOC Progression Note (Addendum)
Transition of Care [facility]) - Progression Note    Patient Details  Name: DEONDRE MARINARO MRN: [id number] Date of Birth: [birth year]  Transition of Care [facility]) CM/SW [place], [place] Phone Number: 02/17/2020, 10:22 AM  Clinical Narrative:     Update 9/8 4:08pm-Ambulance transportation has been approved for patient. Auth # is [id number]. CSW called patients spouse [name] to let her know ambulance request has been approved.  CSW will continue to follow.  Update 9/8 11:30am-CSW confirmed plan with patients spouse [place], [place], and [place]. Plan is for patient to go to [facility] tomorrow if visitation does not change depending on covid results that come back from facility. If visitation changes SNF choice will change and plan will be to go to different SNF tomorrow. PA will complete peer to peer today for possible PTAR approval for patient.  CSW will continue to follow.    CSW spoke with [employer] and patients insurance has been approved for SNF placement at [facility]. The approval number is [id number]. The approval is good for seven days.Ambulance request has gone to a peer to peer. CSW chatted physician about peer to peer for ambulance/PTAR approval. CSW will continue to follow to see if peer to peer is approved for PTAR.  Patient has SNF bed at [facility]. PTAR approval is pending. CSW awaiting peer to peer to see if it is approved.  CSW will continue to follow.  Expected Discharge Plan: Skilled Nursing Facility Barriers to Discharge: Ship broker, SNF Pending bed offer, Continued Medical Work up  Expected Discharge Plan and Services Expected Discharge Plan: [place] arrangements for the past 2 months: Single Family Home                                       Social Determinants of Health (SDOH) Interventions    Readmission Risk Interventions No flowsheet data found.

## 2020-02-17 NOTE — Discharge Summary (Addendum)
Discharge Summary    Patient ID: Eduardo [name] MRN: [id number]; DOB: 05-03-1931  Admit date: 02/07/2020 Discharge date: 02/18/2020  Primary Care Provider: Venia Carbon, MD  Primary Cardiologist: Eduardo Rogue, MD  Primary Electrophysiologist:  None   Discharge Diagnoses    Principal Problem:   NSTEMI (non-ST elevated myocardial infarction) [facility]) Active Problems:   Atrial fibrillation ([place])   COPD GOLD II B   Chronic anemia   Acute on chronic combined systolic and diastolic CHF (congestive heart failure) ([place])   Hyperlipidemia   Acute kidney failure ([place])   Hypoxia    Diagnostic Studies/Procedures    Echocardiogram 02/07/2020: Impressions: 1. Left ventricular ejection fraction, by estimation, is 30 to 35%. The  left ventricle has moderately decreased function. The left ventricle  demonstrates global hypokinesis. There is moderate left ventricular  hypertrophy. Left ventricular diastolic  parameters are consistent with Grade III diastolic dysfunction  (restrictive).  2. Right ventricular systolic function is normal. The right ventricular  size is normal. There is moderately elevated pulmonary artery systolic  pressure. The estimated right ventricular systolic pressure is 09.3 mmHg.  3. Left atrial size was severely dilated.  4. Right atrial size was moderately dilated.  5. The mitral valve is normal in structure. Moderate mitral valve  regurgitation. No evidence of mitral stenosis.  6. The aortic valve is tricuspid. Aortic valve regurgitation is not  visualized. No aortic stenosis is present.  7. Aortic dilatation noted. There is mild dilatation of the ascending  aorta measuring 37 mm.  8. The inferior vena cava is dilated in size with <50% respiratory  variability, suggesting right atrial pressure of 15 mmHg.   Comparison(s): Prior images reviewed side by side. Changes from prior  study are noted. The left ventricular function is significantly worse.   Prior ECHO EF WAS NORMAL.  Renal Ultrasound 02/09/2020: Impressions: BILATERAL renal cysts. No renal sonographic abnormalities. Mild ascites and small RIGHT pleural effusion. _____________  Left Heart Catheterization 02/11/2020:  Widely patent left main  Proximal LAD 70 to 80% with either thrombus or calcified nodular plaque.  First diagonal 50% narrowed proximally.  First obtuse marginal circumflex and the distal third contains subtotal occlusion which could be an acute culprit lesion.  Functionally occluded right coronary with ostial/sinus of Valsalva aneurysm.  Left-to-right collaterals from LAD to PDA around the apex.  Severely elevated LVEDP consistent with acute on chronic combined systolic and diastolic heart failure.  Total contrast 27 cc  Recommendations:  Track creatinine  We will hydrate but patient will be at risk for heart failure.  Best therapy for CAD is medical management.  Diagnostic Dominance: Right     History of Present Illness     Eduardo [name] is a 86 y.o. male with a history of CAD, chronic diastolic CHF, permanent atrial fibrillation on Eliquis, bilateral carotid artery stenosis, hyperlipidemia, and COPD who presented to the ED on 02/07/2020 for further evaluation of chest pain. Patient followed by Dr. Rockey [name]. Cardiac cath in 2012 showed non-obstructive CAD involving LAD and RCA. He subsequently had a RCA dissection which was treated medically.   Patient reported acute onset of chest pain while in bed around midnight prior to presentation. He described anterior chest pressure that radiated down both arms with associated nausea and one episode of nonbilious, nonbloody emesis. He ranked pain as a 9/10 on the pain scale at its worse. In the ED, he ranked the pain as a 2/10. Pain similar to prior heart attack and did not  improved with Nitro en route to the ED. No recent fevers, cough, or infectious symptoms. He had been off Eliquis for about 1 week  following a dental procedure.   In the ED, EKG showed atrial fibrillation with LBBB and PVC/aberrant conduction. No ischemic changes compared to prior EKG. High-sensitivity troponin elevated at 33 >> 414. Chest x-ray showed cardiomegaly with vascular congestion and probable mild interstitial edema. WBC 7.7, Hgb 10.5, Plts 235. Na 140, K 3.7, Glucose 124, BUN 37, Cr 1.07. COVID-19 testing negative.   Hospital Course     Consultants: Nephrology  NSTEMI  Patient admitted with NSTEMI on 02/07/2020 as stated above. High-sensitivity troponin peaked at >27,000. Echo showed LVEF of 30-35% with global hypokinesis, moderate LVH, and grade III diastolic dysfunction. Cardiac catheterization on 02/11/2020 (delayed due to AKI) showed 70-80% proximal LAD lesion (either thrombus or calcified nodular plaque), 99% stenosis of proximal RCA with left to right collaterals, and 99% stenosis in the 1st Mrg vessel felt to be the culprit lesion. Noted to have severely elevated LVEDP on cath. Medical therapy recommended. Started on Aspirin 81mg  daily and Toprol-XL 50mg  daily. Home Lipitor 80mg  continued.    Acute on Chronic Combined CHF with Hypoxia/ Ischemic Cardiomyopathy  Echo this admission showed LVEF of 30-35% with global hypokinesis, moderate LVH, grade III diastolic dysfunction, moderate MR, and mildly dilated ascending aorta. EF down from 55-60% on Echo in 2018. LVEDP elevated at 27 mmHg on cath. BNP elevated at 802 >> 1,856. Patient was started on IV Lasix but diuresis complicated by worsening renal function with creatinine peaking at 2.31. Nephrology consulted - see below. Patient was ultimately restart on PO Lasix 80mg  daily. Patient started on Toprol-XL 50mg  daily, Hydralazine 10mg  three times daily, and Imdur 15mg  daily. Initially started on Farxiga but given borderline GFR (cutoff 30) will hold on discharge. Can consider restarting if renal function improves (although [place] not covered by patient's insurance and  [place] would be more affordable). Patient's creatinine has been trending back up the last couple of days prior to discharge and 2.05 today. Will hold PO Lasix for 2 days and then restart at Lasix 40mg  daily on 02/21/2020. Will need BMET in 1 week at SNF or follow-up visit (patient has follow-up visit scheduled for 1 week but if this gets moved, SNF will need to draw BMET).  Hypoxia Patient still hypoxic at times. On 1L of O2 via nasal cannula at rest. Will need O2 via nasal cannula at SNF. Placed order home DME O2 order.   Chronic Atrial Fibrillation  Rate controlled. Home Verapamil discontinued due to reduced EF and patient started on Toprol-XL 50mg  daily. Home Eliquis reduced 2.5mg  twice daily due to worsening renal function in addition to weight <70 kg.   AKI  Creatinine 1.07 on admission and peaked at 2.31 on 02/09/2020. Nephrology consulted. Renal ultrasound showed bilateral renal cysts, mild ascites , and small right pleural effusion but no renal sonographic abnormalities. AKI felt to be due to acute tubular necrosis which was felt to be due to hypotension. Creatinine 2.05 on day of discharge. Will hold Lasix for 2 days and then restart at 40mg  daily as stated above. Will need repeat BMET in 1 week at SNF or follow-up visit.  Hyperlipidemia  Patient has history of significant hypertriglyceridemia. Lipid panel not checked this admission. LDL 65 but triglycerides 1,142 in 08/2019. Continue home Lipitor 80mg  daily. Home Fenofibrate discontinued given AKI (contraindicated with CrCl <30).   COPD  Continue home inhalers.  Chronic Anemia  Hemoglobin stable at 10.0 on day of discharge. Apparently diagnosed with iron deficiency anemia 8 years ago. Started on iron supplement. Can repeat CBC at follow-up visit.  Deconditioned Discharged to SNF.    Patient seen and examined by Dr. Gwenlyn Found and determined to be stable for discharge. Outpatient follow-up has been arranged. Medications as below.     Did the patient have an acute coronary syndrome (MI, NSTEMI, STEMI, etc) this admission?:  Yes                               AHA/ACC Clinical Performance & Quality Measures: 4. Aspirin prescribed? - Yes 5. ADP Receptor Inhibitor (Plavix/Clopidogrel, Brilinta/Ticagrelor or Effient/Prasugrel) prescribed (includes medically managed patients)? - No - no PCI performed and treating medically 6. Beta Blocker prescribed? - Yes 7. High Intensity Statin (Lipitor 40-80mg  or Crestor 20-40mg ) prescribed? - Yes 8. EF assessed during THIS hospitalization? - Yes 9. For EF <40%, was ACEI/ARB prescribed? - No - Reason:  renal function 10. For EF <40%, Aldosterone Antagonist (Spironolactone or Eplerenone) prescribed? - No - Reason:  renal function 11. Cardiac Rehab Phase II ordered (including medically managed patients)? - Yes   _____________  Discharge Vitals Blood pressure 125/82, pulse 70, temperature 98.1 F (36.7 C), temperature source Oral, resp. rate 16, height 5\' 2"  (1.575 m), weight 67.7 kg, SpO2 95 %.  Filed Weights   02/16/20 0445 02/17/20 0359 02/18/20 0430  Weight: 67.7 kg 68.7 kg 67.7 kg    Labs & Radiologic Studies    CBC Recent Labs    02/17/20 0630 02/18/20 0821  WBC 9.8 11.0*  HGB 9.4* 10.0*  HCT 31.8* 32.5*  MCV 96.7 97.3  PLT 272 161   Basic Metabolic Panel Recent Labs    02/17/20 0601 02/18/20 0821  NA 134* 133*  K 4.0 4.3  CL 96* 94*  CO2 28 27  GLUCOSE 107* 100*  BUN 63* 65*  CREATININE 1.87* 2.05*  CALCIUM 10.0 9.9  PHOS 3.6 3.8   Liver Function Tests Recent Labs    02/17/20 0601 02/18/20 0821  ALBUMIN 3.6 3.5   No results for input(s): LIPASE, AMYLASE in the last 72 hours. High Sensitivity Troponin:   Recent Labs  Lab 02/07/20 0127 02/07/20 0333 02/07/20 1206 02/07/20 1535  TROPONINIHS 33* 414* 21,943* >27,000*    BNP Invalid input(s): POCBNP D-Dimer No results for input(s): DDIMER in the last 72 hours. Hemoglobin A1C No results for  input(s): HGBA1C in the last 72 hours. Fasting Lipid Panel No results for input(s): CHOL, HDL, LDLCALC, TRIG, CHOLHDL, LDLDIRECT in the last 72 hours. Thyroid Function Tests No results for input(s): TSH, T4TOTAL, T3FREE, THYROIDAB in the last 72 hours.  Invalid input(s): FREET3 _____________  DG Chest 2 View  Result Date: 02/07/2020 CLINICAL DATA:  Chest pain EXAM: CHEST - 2 VIEW COMPARISON:  08/03/2019, 08/10/2019, 12/30/2017 FINDINGS: No significant pleural effusion. Cardiomegaly with mild central vascular congestion. Probable mild interstitial edema. Aortic atherosclerosis. No pneumothorax. IMPRESSION: Cardiomegaly with vascular congestion and probable mild interstitial edema. Electronically Signed   By: Donavan Foil M.D.   On: 02/07/2020 02:34   CARDIAC CATHETERIZATION  Result Date: 02/11/2020  Widely patent left main  Proximal LAD 70 to 80% with either thrombus or calcified nodular plaque.  First diagonal 50% narrowed proximally.  First obtuse marginal circumflex and the distal third contains subtotal occlusion which could be an acute culprit lesion.  Functionally occluded right coronary with ostial/sinus  of Valsalva aneurysm.  Left-to-right collaterals from LAD to PDA around the apex.  Severely elevated LVEDP consistent with acute on chronic combined systolic and diastolic heart failure.  Total contrast 27 cc RECOMMENDATIONS:  Track creatinine  We will hydrate but patient will be at risk for heart failure.  Best therapy for CAD is medical management.  US RENAL  Result Date: 02/09/2020 CLINICAL DATA:  Acute kidney failure; history coronary artery disease post MI, CHF, hypertension, COPD, former smoker EXAM: RENAL / URINARY TRACT ULTRASOUND COMPLETE COMPARISON:  None FINDINGS: Right Kidney: Renal measurements: 11.1 x 5.5 x 6.0 cm = volume: 92 mL. Normal cortical thickness. Upper normal cortical echogenicity. Large cyst at inferior pole 3.9 x 4.6 x 3.9 cm, simple features. Additional  tiny cyst inferior pole 1.7 x 1.6 x 1.6 cm, simple features. No solid mass or hydronephrosis. No shadowing calcification. Left Kidney: Renal measurements: 11.3 x 5.5 x 4.5 cm = volume: 144 mL. Normal cortical thickness and echogenicity. Two simple appearing LEFT renal cysts are identified, 1.5 x 2.0 x 1.6 cm at upper pole and 4.0 x 4.5 x 3.4 cm at inferior pole. No additional mass or hydronephrosis. Bladder: Appears normal for degree of bladder distention. Other: Incidentally noted mild ascites and small RIGHT pleural effusion. IMPRESSION: BILATERAL renal cysts. No renal sonographic abnormalities. Mild ascites and small RIGHT pleural effusion. Electronically Signed   By: Lavonia Dana M.D.   On: 02/09/2020 12:20   ECHOCARDIOGRAM COMPLETE  Result Date: 02/07/2020    ECHOCARDIOGRAM REPORT   Patient Name:   Eduardo [name] Date of Exam: 02/07/2020 Medical Rec #:  [id number]          Height:       62.0 in Accession #:    [id number]         Weight:       138.9 lb Date of Birth:  [birth year]         BSA:          1.637 m Patient Age:    57 years           BP:           130/79 mmHg Patient Gender: M                  HR:           75 bpm. Exam Location:  Inpatient Procedure: 2D Echo, Color Doppler and Cardiac Doppler Indications:    NSTEMI  History:        Patient has prior history of Echocardiogram examinations, most                 recent 08/12/2018. CHF, CAD, COPD, Arrythmias:Atrial Fibrillation;                 Risk Factors:Hypertension and Dyslipidemia.  Sonographer:    Raquel Sarna Senior RDCS Referring Phys: [id number] [place]  1. Left ventricular ejection fraction, by estimation, is 30 to 35%. The left ventricle has moderately decreased function. The left ventricle demonstrates global hypokinesis. There is moderate left ventricular hypertrophy. Left ventricular diastolic parameters are consistent with Grade III diastolic dysfunction (restrictive).  2. Right ventricular systolic function is normal. The  right ventricular size is normal. There is moderately elevated pulmonary artery systolic pressure. The estimated right ventricular systolic pressure is 85.2 mmHg.  3. Left atrial size was severely dilated.  4. Right atrial size was moderately dilated.  5. The mitral valve is normal in structure. Moderate  mitral valve regurgitation. No evidence of mitral stenosis.  6. The aortic valve is tricuspid. Aortic valve regurgitation is not visualized. No aortic stenosis is present.  7. Aortic dilatation noted. There is mild dilatation of the ascending aorta measuring 37 mm.  8. The inferior vena cava is dilated in size with <50% respiratory variability, suggesting right atrial pressure of 15 mmHg. Comparison(s): Prior images reviewed side by side. Changes from prior study are noted. The left ventricular function is significantly worse. Prior ECHO EF WAS NORMAL. FINDINGS  Left Ventricle: Left ventricular ejection fraction, by estimation, is 30 to 35%. The left ventricle has moderately decreased function. The left ventricle demonstrates global hypokinesis. The left ventricular internal cavity size was normal in size. There is moderate left ventricular hypertrophy. Left ventricular diastolic parameters are consistent with Grade III diastolic dysfunction (restrictive).  LV Wall Scoring: The mid and distal lateral wall, mid and distal inferior wall, mid anterolateral segment, apical septal segment, and apex are akinetic. Right Ventricle: The right ventricular size is normal. No increase in right ventricular wall thickness. Right ventricular systolic function is normal. There is moderately elevated pulmonary artery systolic pressure. The tricuspid regurgitant velocity is 3.14 m/s, and with an assumed right atrial pressure of 15 mmHg, the estimated right ventricular systolic pressure is 02.7 mmHg. Left Atrium: Left atrial size was severely dilated. Right Atrium: Right atrial size was moderately dilated. Pericardium: There is no  evidence of pericardial effusion. Mitral Valve: The mitral valve is normal in structure. There is severe thickening of the mitral valve leaflet(s). There is severe calcification of the mitral valve leaflet(s). Normal mobility of the mitral valve leaflets. Severe mitral annular calcification. Moderate mitral valve regurgitation. No evidence of mitral valve stenosis. Tricuspid Valve: The tricuspid valve is normal in structure. Tricuspid valve regurgitation is mild . No evidence of tricuspid stenosis. Aortic Valve: The aortic valve is tricuspid. . There is severe thickening and severe calcifcation of the aortic valve. Aortic valve regurgitation is not visualized. No aortic stenosis is present. There is severe thickening of the aortic valve. There is severe calcifcation of the aortic valve. Pulmonic Valve: The pulmonic valve was normal in structure. Pulmonic valve regurgitation is trivial. No evidence of pulmonic stenosis. Aorta: Aortic dilatation noted. There is mild dilatation of the ascending aorta measuring 37 mm. Venous: The inferior vena cava is dilated in size with less than 50% respiratory variability, suggesting right atrial pressure of 15 mmHg. IAS/Shunts: No atrial level shunt detected by color flow Doppler.  LEFT VENTRICLE PLAX 2D LVIDd:         5.00 cm      Diastology LVIDs:         3.70 cm      LV e' lateral:   9.81 cm/s LV PW:         1.70 cm      LV E/e' lateral: 13.4 LV IVS:        1.30 cm      LV e' medial:    3.73 cm/s LVOT diam:     1.80 cm      LV E/e' medial:  35.1 LV SV:         60 LV SV Index:   37 LVOT Area:     2.54 cm  LV Volumes (MOD) LV vol d, MOD A2C: 128.0 ml LV vol d, MOD A4C: 102.0 ml LV vol s, MOD A2C: 86.2 ml LV vol s, MOD A4C: 55.0 ml LV SV MOD A2C:  41.8 ml LV SV MOD A4C:     102.0 ml LV SV MOD BP:      46.0 ml RIGHT VENTRICLE RV S prime:     5.22 cm/s TAPSE (M-mode): 1.1 cm LEFT ATRIUM              Index       RIGHT ATRIUM           Index LA diam:        4.80 cm  2.93 cm/m  RA  Area:     21.90 cm LA Vol (A2C):   108.0 ml 65.96 ml/m RA Volume:   63.40 ml  38.72 ml/m LA Vol (A4C):   110.0 ml 67.19 ml/m LA Biplane Vol: 114.0 ml 69.63 ml/m  AORTIC VALVE LVOT Vmax:   110.80 cm/s LVOT Vmean:  85.133 cm/s LVOT VTI:    0.236 m  AORTA Ao Root diam: 3.00 cm Ao Asc diam:  3.70 cm MITRAL VALVE                TRICUSPID VALVE MV Area (PHT): 4.57 cm     TR Peak grad:   39.4 mmHg MV Decel Time: 166 msec     TR Vmax:        314.00 cm/s MV E velocity: 131.00 cm/s                             SHUNTS                             Systemic VTI:  0.24 m                             Systemic Diam: 1.80 cm Candee Furbish MD Electronically signed by Candee Furbish MD Signature Date/Time: 02/07/2020/4:37:24 PM    Final    Disposition   Patient is being discharged home today in good condition.  Follow-up Plans & Appointments     Contact information for follow-up providers    Loel Dubonnet, NP Follow up.   Specialty: Cardiology Why: Hospital follow-up scheduled for 02/25/2020 at 3pm wtih Laurann Montana, one of Dr. Donivan Scull NPs. Please arrive 15 minutes early for check-in. If this date/time does not work for you, please call our office to reschedule. Contact information: [place] [address] [phone]            Contact information for after-discharge care    Destination    [facility] .   Service: Skilled Nursing Contact information: [address] [place] [phone]                 Discharge Instructions    Amb Referral to Cardiac Rehabilitation   Complete by: As directed    Diagnosis: NSTEMI   After initial evaluation and assessments completed: Virtual Based Care may be provided alone or in conjunction with Phase 2 Cardiac Rehab based on patient barriers.: Yes      Discharge Medications   Allergies as of 02/18/2020      Reactions   Penicillins Itching   Has patient had a PCN reaction  causing immediate rash, facial/tongue/throat swelling, SOB or lightheadedness with hypotension: Yes Has patient had a PCN reaction causing severe rash involving mucus membranes or skin necrosis: No Has patient had a PCN  reaction that required hospitalization No Has patient had a PCN reaction occurring within the last 10 years: No If all of the above answers are "NO", then may proceed with Cephalosporin use.   Sulfonamide Derivatives Other (See Comments)   "it affected my kidneys"   Tape Other (See Comments)   Arms black and blue, tears skin.  Please use "paper" tape      Medication List    STOP taking these medications   fenofibrate 160 MG tablet   verapamil 240 MG CR tablet Commonly known as: CALAN-SR     TAKE these medications   albuterol (2.5 MG/3ML) 0.083% nebulizer solution Commonly known as: PROVENTIL Use 1 vial in nebulizer every 4 hours as needed for wheezing or shortness of breath   ALPRAZolam 0.25 MG tablet Commonly known as: XANAX Take 1 tablet (0.25 mg total) by mouth 2 (two) times daily as needed for anxiety.   apixaban 2.5 MG Tabs tablet Commonly known as: ELIQUIS Take 1 tablet (2.5 mg total) by mouth 2 (two) times daily. What changed:   medication strength  how much to take   aspirin 81 MG EC tablet Take 1 tablet (81 mg total) by mouth daily. Swallow whole.   atorvastatin 80 MG tablet Commonly known as: LIPITOR Take 1 tablet (80 mg total) by mouth daily.   cetirizine 10 MG tablet Commonly known as: ZYRTEC Take 10 mg by mouth at bedtime.   [employer] COQ-10 PO Take 300 mg by mouth daily.   escitalopram 20 MG tablet Commonly known as: LEXAPRO Take 20 mg by mouth at bedtime.   ferrous sulfate 325 (65 FE) MG tablet Take 1 tablet (325 mg total) by mouth daily with breakfast.   furosemide 40 MG tablet Commonly known as: LASIX TAKE ONE TABLET BY MOUTH ONE TIME DAILY Notes to patient: Restart on 02/21/2020.   hydrALAZINE 10 MG tablet Commonly known as:  APRESOLINE TAKE ONE TABLET BY MOUTH THREE TIMES DAILY   ipratropium 0.03 % nasal spray Commonly known as: ATROVENT INSTILL Two sprays into both nostrils at bedtime What changed: See the new instructions.   isosorbide mononitrate 30 MG 24 hr tablet Commonly known as: IMDUR Take 0.5 tablets (15 mg total) by mouth daily.   lamoTRIgine 150 MG tablet Commonly known as: LAMICTAL Take 150 mg by mouth at bedtime. Reported on 08/14/2015   metoprolol succinate 50 MG 24 hr tablet Commonly known as: TOPROL-XL Take 1 tablet (50 mg total) by mouth daily. Take with or immediately following a meal.   montelukast 10 MG tablet Commonly known as: SINGULAIR TAKE ONE TABLET BY MOUTH DAILY AT BEDTIME   multivitamin tablet Take 1 tablet by mouth at bedtime.   nitroGLYCERIN 0.4 MG SL tablet Commonly known as: NITROSTAT Place 1 tablet (0.4 mg total) under the tongue every 5 (five) minutes as needed for chest pain.   pantoprazole 40 MG tablet Commonly known as: PROTONIX TAKE 1 TABLET BY MOUTH DAILY BEFORE BREAKFAST What changed: See the new instructions.   polyethylene glycol 17 g packet Commonly known as: MIRALAX / GLYCOLAX Take 17 g by mouth daily as needed for moderate constipation.   Stiolto Respimat 2.5-2.5 MCG/ACT Aers Generic drug: Tiotropium Bromide-Olodaterol inhale 2 puffs by mouth into the lungs once daily What changed: See the new instructions.   SUPER B COMPLEX PO Take 1 tablet by mouth at bedtime.   triamcinolone cream 0.1 % Commonly known as: KENALOG Apply 1 application topically 2 (two) times daily as needed (for skin).  Vitamin D 50 MCG (2000 UT) Caps Take 2,000 Units by mouth daily.            Durable Medical Equipment  (From admission, onward)         Start     Ordered   02/18/20 1045  For home use only DME oxygen  Once       Comments: Patient needs 1-2 L/min via nasal cannula. Normal 1 L at rest but may need 2 L with activity.  Question Answer Comment    Length of Need 6 Months   Mode or (Route) Nasal cannula   Liters per Minute 2   Frequency Continuous (stationary and portable oxygen unit needed)   Oxygen delivery system Gas      02/18/20 1045             Outstanding Labs/Studies   Repeat BMET in 1 week at SNF or follow-up appointment. Repeat CBC at follow-up appointment.  Duration of Discharge Encounter   Greater than 30 minutes including physician time.  Signed, Darreld Mclean, PA-C 02/18/2020, 11:04 AM

## 2020-02-17 NOTE — Care Management (Signed)
Case manager submitted request for Benefits check for copay of Farxiga . TOC team continues to monitor. Ricki Miller, RN BSN Case Manager [phone]

## 2020-02-17 NOTE — Progress Notes (Signed)
Report given to [place], Therapist, sports. Patient is alert and oriented x4 resting in bed with call light in reach. VSS. A fib with BBB on tele. 1L of O2 in place via [place], DOE noted. Patient is to be DC'd [facility]. Wife has seen the facility and is in agreement with patient going. Hgb 9.4, platelets 272 this AM.

## 2020-02-18 DIAGNOSIS — F419 Anxiety disorder, unspecified: Secondary | ICD-10-CM | POA: Diagnosis not present

## 2020-02-18 DIAGNOSIS — I2510 Atherosclerotic heart disease of native coronary artery without angina pectoris: Secondary | ICD-10-CM | POA: Diagnosis not present

## 2020-02-18 DIAGNOSIS — R262 Difficulty in walking, not elsewhere classified: Secondary | ICD-10-CM | POA: Diagnosis not present

## 2020-02-18 DIAGNOSIS — J189 Pneumonia, unspecified organism: Secondary | ICD-10-CM | POA: Diagnosis not present

## 2020-02-18 DIAGNOSIS — R0902 Hypoxemia: Secondary | ICD-10-CM

## 2020-02-18 DIAGNOSIS — I4821 Permanent atrial fibrillation: Secondary | ICD-10-CM | POA: Diagnosis not present

## 2020-02-18 DIAGNOSIS — J181 Lobar pneumonia, unspecified organism: Secondary | ICD-10-CM | POA: Diagnosis not present

## 2020-02-18 DIAGNOSIS — I214 Non-ST elevation (NSTEMI) myocardial infarction: Secondary | ICD-10-CM | POA: Diagnosis not present

## 2020-02-18 DIAGNOSIS — N179 Acute kidney failure, unspecified: Secondary | ICD-10-CM | POA: Diagnosis not present

## 2020-02-18 DIAGNOSIS — J449 Chronic obstructive pulmonary disease, unspecified: Secondary | ICD-10-CM | POA: Diagnosis not present

## 2020-02-18 DIAGNOSIS — I4819 Other persistent atrial fibrillation: Secondary | ICD-10-CM | POA: Diagnosis not present

## 2020-02-18 DIAGNOSIS — Z7401 Bed confinement status: Secondary | ICD-10-CM | POA: Diagnosis not present

## 2020-02-18 DIAGNOSIS — R4189 Other symptoms and signs involving cognitive functions and awareness: Secondary | ICD-10-CM | POA: Diagnosis not present

## 2020-02-18 DIAGNOSIS — I5043 Acute on chronic combined systolic (congestive) and diastolic (congestive) heart failure: Secondary | ICD-10-CM | POA: Diagnosis not present

## 2020-02-18 DIAGNOSIS — F05 Delirium due to known physiological condition: Secondary | ICD-10-CM | POA: Diagnosis not present

## 2020-02-18 DIAGNOSIS — M6281 Muscle weakness (generalized): Secondary | ICD-10-CM | POA: Diagnosis not present

## 2020-02-18 DIAGNOSIS — E785 Hyperlipidemia, unspecified: Secondary | ICD-10-CM | POA: Diagnosis not present

## 2020-02-18 DIAGNOSIS — F39 Unspecified mood [affective] disorder: Secondary | ICD-10-CM | POA: Diagnosis not present

## 2020-02-18 DIAGNOSIS — Z7901 Long term (current) use of anticoagulants: Secondary | ICD-10-CM | POA: Diagnosis not present

## 2020-02-18 DIAGNOSIS — D692 Other nonthrombocytopenic purpura: Secondary | ICD-10-CM | POA: Diagnosis not present

## 2020-02-18 DIAGNOSIS — I25119 Atherosclerotic heart disease of native coronary artery with unspecified angina pectoris: Secondary | ICD-10-CM | POA: Diagnosis not present

## 2020-02-18 DIAGNOSIS — R278 Other lack of coordination: Secondary | ICD-10-CM | POA: Diagnosis not present

## 2020-02-18 DIAGNOSIS — M2550 Pain in unspecified joint: Secondary | ICD-10-CM | POA: Diagnosis not present

## 2020-02-18 DIAGNOSIS — R0602 Shortness of breath: Secondary | ICD-10-CM | POA: Diagnosis not present

## 2020-02-18 DIAGNOSIS — R110 Nausea: Secondary | ICD-10-CM | POA: Diagnosis not present

## 2020-02-18 DIAGNOSIS — I130 Hypertensive heart and chronic kidney disease with heart failure and stage 1 through stage 4 chronic kidney disease, or unspecified chronic kidney disease: Secondary | ICD-10-CM | POA: Diagnosis not present

## 2020-02-18 DIAGNOSIS — K219 Gastro-esophageal reflux disease without esophagitis: Secondary | ICD-10-CM | POA: Diagnosis not present

## 2020-02-18 DIAGNOSIS — R410 Disorientation, unspecified: Secondary | ICD-10-CM | POA: Diagnosis not present

## 2020-02-18 DIAGNOSIS — I5032 Chronic diastolic (congestive) heart failure: Secondary | ICD-10-CM | POA: Diagnosis not present

## 2020-02-18 DIAGNOSIS — Z741 Need for assistance with personal care: Secondary | ICD-10-CM | POA: Diagnosis not present

## 2020-02-18 DIAGNOSIS — I5042 Chronic combined systolic (congestive) and diastolic (congestive) heart failure: Secondary | ICD-10-CM | POA: Diagnosis not present

## 2020-02-18 DIAGNOSIS — R2681 Unsteadiness on feet: Secondary | ICD-10-CM | POA: Diagnosis not present

## 2020-02-18 DIAGNOSIS — I482 Chronic atrial fibrillation, unspecified: Secondary | ICD-10-CM | POA: Diagnosis not present

## 2020-02-18 DIAGNOSIS — J439 Emphysema, unspecified: Secondary | ICD-10-CM | POA: Diagnosis not present

## 2020-02-18 DIAGNOSIS — I4811 Longstanding persistent atrial fibrillation: Secondary | ICD-10-CM | POA: Diagnosis not present

## 2020-02-18 DIAGNOSIS — J441 Chronic obstructive pulmonary disease with (acute) exacerbation: Secondary | ICD-10-CM | POA: Diagnosis not present

## 2020-02-18 DIAGNOSIS — N184 Chronic kidney disease, stage 4 (severe): Secondary | ICD-10-CM | POA: Diagnosis not present

## 2020-02-18 DIAGNOSIS — I25118 Atherosclerotic heart disease of native coronary artery with other forms of angina pectoris: Secondary | ICD-10-CM | POA: Diagnosis not present

## 2020-02-18 LAB — RENAL FUNCTION PANEL
Albumin: 3.5 g/dL (ref 3.5–5.0)
Anion gap: 12 (ref 5–15)
BUN: 65 mg/dL — ABNORMAL HIGH (ref 8–23)
CO2: 27 mmol/L (ref 22–32)
Calcium: 9.9 mg/dL (ref 8.9–10.3)
Chloride: 94 mmol/L — ABNORMAL LOW (ref 98–111)
Creatinine, Ser: 2.05 mg/dL — ABNORMAL HIGH (ref 0.61–1.24)
GFR calc Af Amer: 33 mL/min — ABNORMAL LOW (ref >60)
GFR calc non Af Amer: 28 mL/min — ABNORMAL LOW (ref >60)
Glucose, Bld: 100 mg/dL — ABNORMAL HIGH (ref 70–99)
Phosphorus: 3.8 mg/dL (ref 2.5–4.6)
Potassium: 4.3 mmol/L (ref 3.5–5.1)
Sodium: 133 mmol/L — ABNORMAL LOW (ref 135–145)

## 2020-02-18 LAB — CBC
HCT: 32.5 % — ABNORMAL LOW (ref 39.0–52.0)
Hemoglobin: 10 g/dL — ABNORMAL LOW (ref 13.0–17.0)
MCH: 29.9 pg (ref 26.0–34.0)
MCHC: 30.8 g/dL (ref 30.0–36.0)
MCV: 97.3 fL (ref 80.0–100.0)
Platelets: 280 10*3/uL (ref 150–400)
RBC: 3.34 MIL/uL — ABNORMAL LOW (ref 4.22–5.81)
RDW: 18.4 % — ABNORMAL HIGH (ref 11.5–15.5)
WBC: 11 10*3/uL — ABNORMAL HIGH (ref 4.0–10.5)
nRBC: 0 % (ref 0.0–0.2)

## 2020-02-18 MED ORDER — FERROUS SULFATE 325 (65 FE) MG PO TABS: 325.0000 mg | ORAL_TABLET | Freq: Every day | ORAL | 3 refills | Status: AC

## 2020-02-18 MED ORDER — METOPROLOL SUCCINATE ER 50 MG PO TB24: 50.0000 mg | ORAL_TABLET | Freq: Every day | ORAL | Status: AC

## 2020-02-18 MED ORDER — APIXABAN 2.5 MG PO TABS: 2.5000 mg | ORAL_TABLET | Freq: Two times a day (BID) | ORAL | Status: AC

## 2020-02-18 MED ORDER — ASPIRIN 81 MG PO TBEC: 81.0000 mg | DELAYED_RELEASE_TABLET | Freq: Every day | ORAL | 11 refills | Status: AC

## 2020-02-18 MED ORDER — ISOSORBIDE MONONITRATE ER 30 MG PO TB24: 15.0000 mg | ORAL_TABLET | Freq: Every day | ORAL | Status: DC

## 2020-02-18 NOTE — TOC Transition Note (Signed)
Transition of Care [facility]) - CM/SW Discharge Note   Patient Details  Name: Eduardo [name] MRN: [id number] Date of Birth: November 30, 1930  Transition of Care [facility]) CM/SW Contact:  Trula Ore, [place] Phone Number: 02/18/2020, 10:43 AM   Clinical Narrative:     Patient will DC to: [facility]   Anticipated DC date: 02/18/2020  Family notified: [name]  Transport by: Corey Harold  ?  Per MD patient ready for DC to [facility] . RN, patient, patient's family, and facility notified of DC. Discharge Summary sent to facility. RN given number for report tele#[phone] RM#[phone]. DC packet on chart. Ambulance transport requested for patient.  CSW signing off.  Final next level of care: Skilled Nursing Facility Barriers to Discharge: Ship broker, SNF Pending bed offer, Continued Medical Work up   Patient Goals and CMS Choice Patient states their goals for this hospitalization and ongoing recovery are:: to go to SNF CMS Medicare.gov Compare Post Acute Care list provided to:: Patient Represenative (must comment) [name]) Choice offered to / list presented to : Spouse [name])  Discharge Placement              Patient chooses bed at: [facility] Patient to be transferred to facility by: [place] Name of family member notified: [name] Patient and family notified of of transfer: 02/18/20  Discharge Plan and Services                                     Social Determinants of Health (SDOH) Interventions     Readmission Risk Interventions No flowsheet data found.

## 2020-02-18 NOTE — Progress Notes (Addendum)
Physical Therapy Treatment Patient Details Name: Eduardo [name] MRN: [id number] DOB: 1931-06-10 Today's Date: 02/18/2020    History of Present Illness 86 yo male with onset of NSTEMI was admitted, had L heart cath on 02/11/20, noted CHF and atherosclerosis with LAD, occluded R coronary artery.  EF 30-35%, AKI, deconditioned.  Ischemic cardiomyopathy, PMHx:  CAD, a-fib, CHF, COPD, HLD, HTN,     PT Comments    Pt asleep in recliner on entry with [place] removed and O2 probe not reading. Replaced O2 probe and SaO2 on RA noted to be 83%O2. Placed pt on 2L O2 via [place] and instructed pt in purse lip breathing. Pt required increased cuing secondary to increased grogginess. Pt rebounded to mid 90%s O2 within 2 minutes. Educated pt and wife on need for monitoring O2 levels especially with shallow breathing during sleeping. Pt agreeable to therapy however reports increased sleepiness after not sleeping last night. Performed seated exercise to wake up. Pt requires min A for sit>stand, pt unable to step due to LE weakness. Pt performed 4 more sit>stand with min A. Pt and family hopeful for d/c to SNF this afternoon.    Follow Up Recommendations  SNF     Equipment Recommendations  Other (comment) (may need RW instead of his rollator)       Precautions / Restrictions Precautions Precautions: Fall Precaution Comments: monitor O2 sats and HR Restrictions Weight Bearing Restrictions: No    Mobility  Bed Mobility               General bed mobility comments: OOB in the recliner on arrival  Transfers Overall transfer level: Needs assistance Equipment used: Rolling walker (2 wheeled) Transfers: Sit to/from Stand Sit to Stand: Min assist         General transfer comment: min guard for power up from recliner however needs min A for bringing CoG over BoS, with increased knee buckling and inability to step away from chair pt eventually sits with decreased eccentric control vc for reaching back to  chair, performed 4 more sit>stand working on eccentric control in sitting   Ambulation/Gait   General Gait Details: attempted to step away from chair but unable due to increased LE weakness.           Balance Overall balance assessment: Needs assistance Sitting-balance support: Feet supported Sitting balance-Leahy Scale: Fair       Standing balance-Leahy Scale: Poor Standing balance comment: reliant on AD or external support.                            Cognition Arousal/Alertness: Awake/alert Behavior During Therapy: WFL for tasks assessed/performed Overall Cognitive Status: History of cognitive impairments - at baseline                                 General Comments: per wife pt is cognitively at baseline      Exercises General Exercises - Lower Extremity Ankle Circles/Pumps: AROM;Both;10 reps;Seated Long Arc Quad: AROM;Both;10 reps;Seated Heel Slides:  (graded resistance in gross extension) Hip Flexion/Marching: AROM;Strengthening;Both;10 reps;Seated Other Exercises Other Exercises: 5x sit to stand    General Comments General comments (skin integrity, edema, etc.): On entry pt asleep in chair with [place] off and O2 probe off finger. When measured SaO2 on RA 83%O2, instructed pt on purse lip breathing and placed back on 2L O2 via [place]. Educated pt and wife  on probable need for supplemental O2 with sleeping in particular but that O2 probe should be in place when especially if pt is not on supplemental O2.       Pertinent Vitals/Pain Pain Assessment: No/denies pain           PT Goals (current goals can now be found in the care plan section) Acute Rehab PT Goals Patient Stated Goal: get better and go home. PT Goal Formulation: With patient/family Time For Goal Achievement: 02/27/20 Potential to Achieve Goals: Good Progress towards PT goals: Progressing toward goals    Frequency    Min 3X/week      PT Plan Current plan remains  appropriate       AM-PAC PT "6 Clicks" Mobility   Outcome Measure  Help needed turning from your back to your side while in a flat bed without using bedrails?: A Little Help needed moving from lying on your back to sitting on the side of a flat bed without using bedrails?: A Little Help needed moving to and from a bed to a chair (including a wheelchair)?: A Little Help needed standing up from a chair using your arms (e.g., wheelchair or bedside chair)?: A Little Help needed to walk in hospital room?: A Lot Help needed climbing 3-5 steps with a railing? : Total 6 Click Score: 15    End of Session Equipment Utilized During Treatment: Oxygen;Gait belt Activity Tolerance: Patient tolerated treatment well;Patient limited by fatigue Patient left: in chair;with call bell/phone within reach;with family/visitor present Nurse Communication: Mobility status PT Visit Diagnosis: Muscle weakness (generalized) (M62.81);Other abnormalities of gait and mobility (R26.89);Unsteadiness on feet (R26.81)     Time: 2876-8115 PT Time Calculation (min) (ACUTE ONLY): 26 min  Charges:  $Therapeutic Exercise: 23-37 mins                     Saysha Menta B. Migdalia Dk PT, DPT Acute Rehabilitation Services Pager [phone] Office [phone]    [place] 02/18/2020, 1:47 PM

## 2020-02-18 NOTE — Progress Notes (Signed)
Report given to [place], Therapist, sports. Patient is alert and oriented x4 sitting up in bedside chair with call light in reach. VSS. A fib on tele. 1L of O2 in place. Patient's plan is to DC to [facility] today. Labs pending for this AM.

## 2020-02-18 NOTE — Progress Notes (Signed)
Report called to [facility]. Pt transferred with PTAR without issue. Wife at bedside at time of transfer. Carroll Kinds RN

## 2020-02-18 NOTE — Progress Notes (Addendum)
Progress Note  Patient Name: Eduardo [name] Date of Encounter: 02/18/2020  [facility] Cardiologist: Ida Rogue, MD   Subjective   No acute overnight events. Breathing OK. No chest pain. No lightheadedness or dizziness. Likely be discharged to SNF today.   Inpatient Medications    Scheduled Meds: . apixaban  2.5 mg Oral BID  . arformoterol  15 mcg Nebulization BID  . aspirin EC  81 mg Oral Daily  . atorvastatin  80 mg Oral Daily  . escitalopram  20 mg Oral QHS  . ferrous sulfate  325 mg Oral Q breakfast  . furosemide  80 mg Oral Daily  . hydrALAZINE  10 mg Oral TID  . isosorbide mononitrate  15 mg Oral Daily  . lamoTRIgine  150 mg Oral QHS  . metoprolol succinate  50 mg Oral Daily  . multivitamin with minerals  1 tablet Oral QHS  . pantoprazole  40 mg Oral Daily  . sodium chloride flush  3 mL Intravenous Q12H  . umeclidinium bromide  1 puff Inhalation Daily   Continuous Infusions: . sodium chloride     PRN Meds: sodium chloride, acetaminophen, albuterol, nitroGLYCERIN, ondansetron (ZOFRAN) IV, oxyCODONE, polyethylene glycol, sodium chloride flush   Vital Signs    Vitals:   02/17/20 2000 02/17/20 2015 02/18/20 0430 02/18/20 0739  BP: 137/81  125/82   Pulse: 71  67 70  Resp: 18  18 16   Temp: 98 F (36.7 C)  98.1 F (36.7 C)   TempSrc: Oral  Oral   SpO2: 98% 96% 95% 95%  Weight:   67.7 kg   Height:        Intake/Output Summary (Last 24 hours) at 02/18/2020 0741 Last data filed at 02/18/2020 0100 Gross per 24 hour  Intake 50 ml  Output 725 ml  Net -675 ml   Last 3 Weights 02/18/2020 02/17/2020 02/16/2020  Weight (lbs) 149 lb 4.8 oz 151 lb 7.3 oz 149 lb 4.8 oz  Weight (kg) 67.722 kg 68.7 kg 67.722 kg      Telemetry    Atrial fibrillation with rates in the 70's. - Personally Reviewed  ECG    No new ECG tracing today. - Personally Reviewed  Physical Exam   GEN: No acute distress.   Neck: Supple. Cardiac: Irregular rhyhtm with normal rate. No  murmurs, rubs, or gallops.  Respiratory: Clear to auscultation bilaterally. UY:QIHK, non-tender, non-distended  MS:No lower extremity edema. No deformity. Skin: Warm and dry. Neuro:No focal deficits. Psych: Normal affect. Responds appropriately.  Labs    High Sensitivity Troponin:   Recent Labs  Lab 02/07/20 0127 02/07/20 0333 02/07/20 1206 02/07/20 1535  TROPONINIHS 33* 414* 21,943* >27,000*      Chemistry Recent Labs  Lab 02/15/20 0312 02/16/20 0436 02/17/20 0601  NA 133* 132* 134*  K 4.3 4.2 4.0  CL 96* 96* 96*  CO2 26 23 28   GLUCOSE 117* 112* 107*  BUN 63* 65* 63*  CREATININE 1.71* 1.72* 1.87*  CALCIUM 10.1 10.1 10.0  ALBUMIN 3.4* 3.6 3.6  GFRNONAA 35* 35* 32*  GFRAA 41* 41* 37*  ANIONGAP 11 13 10      Hematology Recent Labs  Lab 02/15/20 0312 02/16/20 0436 02/17/20 0630  WBC 9.6 11.4* 9.8  RBC 3.10* 3.33* 3.29*  HGB 8.8* 9.6* 9.4*  HCT 29.2* 31.8* 31.8*  MCV 94.2 95.5 96.7  MCH 28.4 28.8 28.6  MCHC 30.1 30.2 29.6*  RDW 16.8* 17.8* 18.2*  PLT 236 279 272    BNPNo  results for input(s): BNP, PROBNP in the last 168 hours.   DDimer No results for input(s): DDIMER in the last 168 hours.   Radiology    No results found.  Cardiac Studies   Echocardiogram 02/07/2020: Impressions: 1. Left ventricular ejection fraction, by estimation, is 30 to 35%. The  left ventricle has moderately decreased function. The left ventricle  demonstrates global hypokinesis. There is moderate left ventricular  hypertrophy. Left ventricular diastolic  parameters are consistent with Grade III diastolic dysfunction  (restrictive).  2. Right ventricular systolic function is normal. The right ventricular  size is normal. There is moderately elevated pulmonary artery systolic  pressure. The estimated right ventricular systolic pressure is 90.2 mmHg.  3. Left atrial size was severely dilated.  4. Right atrial size was moderately dilated.  5. The mitral valve is  normal in structure. Moderate mitral valve  regurgitation. No evidence of mitral stenosis.  6. The aortic valve is tricuspid. Aortic valve regurgitation is not  visualized. No aortic stenosis is present.  7. Aortic dilatation noted. There is mild dilatation of the ascending  aorta measuring 37 mm.  8. The inferior vena cava is dilated in size with <50% respiratory  variability, suggesting right atrial pressure of 15 mmHg.   Comparison(s):Prior images reviewed side by side. Changes from prior  study are noted. The left ventricular function is significantly worse.  Prior ECHO EF WAS NORMAL. _______________  Left Heart Catheterization 02/11/2020:  Widely patent left main  Proximal LAD 70 to 80% with either thrombus or calcified nodular plaque. First diagonal 50% narrowed proximally.  First obtuse marginal circumflex and the distal third contains subtotal occlusion which could be an acute culprit lesion.  Functionally occluded right coronary with ostial/sinus of Valsalva aneurysm. Left-to-right collaterals from LAD to PDA around the apex.  Severely elevated LVEDP consistent with acute on chronic combined systolic and diastolic heart failure.  Total contrast 27 cc  Recommendations:  Track creatinine  We will hydrate but patient will be at risk for heart failure.  Best therapy for CAD is medical management.  Diagnostic Dominance: Right   Patient Profile     86 y.o. male with a history ofCAD, chronic diastolic CHF, permanent atrial fibrillation on Eliquis, bilateral carotid artery stenosis, hyperlipidemia, and COPD who presented to the ED on 02/07/2020 for further evaluation of chest pain.  Assessment & Plan    NSTEMI -High-sensitivity troponin peaked at >27,000. -Echo showed LVEF of 30-35% with global hypokinesis, moderate LVH, and grade III diastolic dysfunction. Cardiac catheterization on 02/11/2020 (delayed due to AKI) showed 70-80% proximal LAD lesion (either  thrombus or calcified nodular plaque), 99% stenosis of proximal RCA with left to right collaterals, and 99% stenosis in the 1st Mrg vessel felt to be the culprit lesion. Noted to have severely elevated LVEDP on cath. Medical therapy recommended. -No recurrent angina.  -Started on Aspirin 81mg  daily and Toprol-XL 50mg  daily. Home Lipitor 80mg  continued.   Acute on Chronic Combined CHF/ Ischemic cardiomyopathy -BNP elevated at 802 >> 1,856. -Echo this admission showed LVEF of 30-35% with global hypokinesis, moderate LVH, grade III diastolic dysfunction, moderate MR, and mildly dilated ascending aorta. EF down from 55-60% on Echo in 2018. LVEDP elevated at 27 mmHg on cath. -Patient was started on IV Lasix but diuresis complicated by worsening renal function with creatinine peaking at 2.31. Nephrology consulted.Net negative 1.73 L this admission. Weight down 2 lbs from yesterday but up 13 lbs since admission (unsure if documented weights are accurate). - Transitioned  to PO Lasix 80mg  daily on 02/15/2020.  - Continue Toprol-XL 50mg  daily. - Continue Hydralazine 10mg  three times daily. - Imdur 15mg  daily started yesterday. - Continue to monitor daily weights, strict I/O's, and renal function. - Still on 1-2L of O2 via nasal cannula. Will need O2 at discharge.   Chronic Atrial Fibrillation -Rate controlled. -Home Verapamil discontinued due to reduced EF and patient started on Toprol-XL 50mg  daily. -Continue Eliquis. Now on reduced dose of 2.5mg  twice daily given age and renal function.   AKI -Creatinine 1.07 on admission and peaked at 2.31 on 02/09/2020.Today's lab pending.  -Nephrology consulted. Renal ultrasound showed bilateral renal cysts, mild ascites , and small right pleural effusion but no renal sonographic abnormalities. AKI felt to be due to acute tubular necrosis which was felt to be caused by hypotension. - Continue to monitor renal function closely. - Nephrology  has signed off. They did not feel like any follow-up was needed at this at this time.  Hyperlipidemia - Lipid panel not checked this admission but LDL 65 in 08/2019. -Continue home Lipitor 80mg  daily.  COPD  - Continue home inhalers.  Anemia - Hemoglobin stable at 9.4 today.  - Apparently diagnosed with iron deficiency anemia 8 years ago.  - Started Iron supplement.  Deconditioning -SNF recommended.  For questions or updates, please contact [place] Please consult [email or web address] for contact info under        Signed, Darreld Mclean, PA-C  02/18/2020, 7:41 AM    Agree with note by Sande Rives, PA-C  Mr Akhavan is clinically stable for discharge to a skilled nursing facility today.  Follow-up is been arranged with Laurann Montana, PA-C on 9/16 and with Dr. Rockey Situ after that.   Lorretta Harp, M.D., [place], [facility], [place]verta [place] [address] [place], [place]  [address]  [phone] 02/18/2020 9:36 AM

## 2020-02-19 ENCOUNTER — Telehealth: Payer: Self-pay | Admitting: Cardiovascular Disease

## 2020-02-19 NOTE — Telephone Encounter (Signed)
Malachy Mood, can we get one of our nurses to write it in am happy to sign it.

## 2020-02-19 NOTE — Telephone Encounter (Signed)
Patient's wife states she had planned a trip, but had to cancel it due to the patient's condition. She states Dr. Gwenlyn Found saw him last in the hospital and she would like him to write a note of the patient's condition so she can get reimbursed for the tickets. She states if she does not answer her home number to try her cell phone: [phone]

## 2020-02-19 NOTE — Telephone Encounter (Signed)
Spoke to patient's wife letter done.Letter left at [facility] office front desk.

## 2020-02-22 ENCOUNTER — Telehealth: Payer: Self-pay

## 2020-02-22 DIAGNOSIS — N184 Chronic kidney disease, stage 4 (severe): Secondary | ICD-10-CM | POA: Diagnosis not present

## 2020-02-22 DIAGNOSIS — F39 Unspecified mood [affective] disorder: Secondary | ICD-10-CM | POA: Diagnosis not present

## 2020-02-22 DIAGNOSIS — I5042 Chronic combined systolic (congestive) and diastolic (congestive) heart failure: Secondary | ICD-10-CM | POA: Diagnosis not present

## 2020-02-22 DIAGNOSIS — F05 Delirium due to known physiological condition: Secondary | ICD-10-CM | POA: Diagnosis not present

## 2020-02-22 DIAGNOSIS — I482 Chronic atrial fibrillation, unspecified: Secondary | ICD-10-CM | POA: Diagnosis not present

## 2020-02-22 DIAGNOSIS — D692 Other nonthrombocytopenic purpura: Secondary | ICD-10-CM | POA: Diagnosis not present

## 2020-02-22 DIAGNOSIS — J439 Emphysema, unspecified: Secondary | ICD-10-CM | POA: Diagnosis not present

## 2020-02-22 DIAGNOSIS — I25119 Atherosclerotic heart disease of native coronary artery with unspecified angina pectoris: Secondary | ICD-10-CM | POA: Diagnosis not present

## 2020-02-22 NOTE — Telephone Encounter (Signed)
[facility] Night - Client TELEPHONE ADVICE RECORD [facility] Patient Name: Eduardo [name] Gender: Male DOB: 1931-01-08 Age: 86 Y 9 M 27 D Return Phone Number: [phone] (Primary), [phone] (Secondary) Address[place] [address] Client  [facility] Night - Client Client Site [place] Physician [name]- MD Contact Type Call Who Is Calling Patient / Member / Family / Caregiver Call Type Triage / Clinical Caller Name Robynn Pane Relationship To Patient Spouse Return Phone Number [phone] (Primary) Chief Complaint Leg Swelling And Edema Reason for Call Symptomatic / Request for [place] states that her husband's creatinine is up to 2 and legs are swollen Translation No Nurse Assessment Nurse: Laqueta Due, RN, Metallurgist (Eastern Time): 02/20/2020 10:02:50 AM Confirm and document reason for call. If symptomatic, describe symptoms. ---Pt's creatinine is up to 2 and legs are swollen. no fever Has the patient had close contact with a person known or suspected to have the novel coronavirus illness OR traveled / lives in area with major community spread (including international travel) in the last 14 days from the onset of symptoms? * If Asymptomatic, screen for exposure and travel within the last 14 days. ---No Does the patient have any new or worsening symptoms? ---Yes Will a triage be completed? ---Yes Related visit to physician within the last 2 weeks? ---Yes Does the PT have any chronic conditions? (i.e. diabetes, asthma, this includes High risk factors for pregnancy, etc.) ---Yes List chronic conditions. ---CHF Is this a behavioral health or substance abuse call? ---No Guidelines Guideline Title Affirmed Question Affirmed Notes Nurse Date/Time (Eastern Time) Heart Failure PostHospitalization Follow-up Call [1] Thigh, calf, or  ankle swelling AND [2] bilateral AND [3] 1 side is more swollen Laqueta Due, RN, [employer] 02/20/2020 10:08:34 AM Disp. Time (Eastern Time) Disposition Final UserPLEASE NOTE: All timestamps contained within this report are represented as [place] Standard Time. CONFIDENTIALTY NOTICE: This fax transmission is intended only for the addressee. It contains information that is legally privileged, confidential or otherwise protected from use or disclosure. If you are not the intended recipient, you are strictly prohibited from reviewing, disclosing, copying using or disseminating any of this information or taking any action in reliance on or regarding this information. If you have received this fax in error, please notify us immediately by telephone so that we can arrange for its return to [place]. Phone: [phone], Toll-Free: [phone], Fax: [phone] Page: 2 of 2 Call Id: [id number] 02/20/2020 10:12:19 AM See HCP within 4 Hours (or PCP triage) Yes Laqueta Due, RN, Print production planner Understands Yes PreDisposition Did not know what to do Care Advice Given Per Guideline SEE HCP (OR PCP TRIAGE) WITHIN 4 HOURS: * IF OFFICE WILL BE CLOSED AND NO PCP (PRIMARY CARE PROVIDER) SECOND-LEVEL TRIAGE: You need to be seen within the next 3 or 4 hours. A nearby Urgent [facility] [place] [facility]) is often a good source of care. Another choice is to go to the ED. Go sooner if you become worse. CALL BACK IF: * You become worse CARE ADVICE given per Heart Failure Post-Hospitalization Follow-Up Call (Adult) guideline. Comments User: Letta Moynahan, RN Date/Time Eilene Ghazi Time): 02/20/2020 10:08:10 AM pt is in a rehab facility for recent heart attack. User: Letta Moynahan, RN Date/Time Eilene Ghazi Time): 02/20/2020 10:09:38 AM has been acting confused even when in hospital and no worse. Referrals GO TO FACILITY REFUSED GO TO FACILITY UNDECIDED

## 2020-02-22 NOTE — Telephone Encounter (Signed)
Noted  

## 2020-02-22 NOTE — Telephone Encounter (Signed)
Eduardo [name] called to let you know that Dr [name] saw this morning @ [facility]

## 2020-02-22 NOTE — Telephone Encounter (Signed)
LVM on cell and home numbers

## 2020-02-23 ENCOUNTER — Telehealth: Payer: Self-pay | Admitting: *Deleted

## 2020-02-23 NOTE — Telephone Encounter (Signed)
Wife called Triage and just wanted to guidance from PCP. Wife wants to bring pt home from [facility] and as if Dr. [name] had any suggestions or recommendations that could help her transition pt back at home. Pt ask if PCP could call her back if not she will await a call back from [facility].   Wife also was confused about some of pt's meds so she requested I print out of copy of pt's med list so she could have. Med list printed and placed at the front desk for pick up

## 2020-02-23 NOTE — Telephone Encounter (Signed)
She feels he is depressed and not doing well in rehab---wants to take him home Discussed her home care plans---may hire someone as much as 12 hours per day at first. I told I thought that was feasible--but urged her to talk to social worker Crystal at [facility] as setting up home care may take some time

## 2020-02-25 ENCOUNTER — Ambulatory Visit: Payer: PPO | Admitting: Family

## 2020-02-25 ENCOUNTER — Encounter: Payer: Self-pay | Admitting: Family

## 2020-02-25 ENCOUNTER — Other Ambulatory Visit: Payer: Self-pay

## 2020-02-25 VITALS — BP 108/60 | HR 67 | Ht 62.0 in | Wt 151.5 lb

## 2020-02-25 DIAGNOSIS — I4821 Permanent atrial fibrillation: Secondary | ICD-10-CM | POA: Diagnosis not present

## 2020-02-25 DIAGNOSIS — E785 Hyperlipidemia, unspecified: Secondary | ICD-10-CM | POA: Diagnosis not present

## 2020-02-25 DIAGNOSIS — I5042 Chronic combined systolic (congestive) and diastolic (congestive) heart failure: Secondary | ICD-10-CM | POA: Diagnosis not present

## 2020-02-25 DIAGNOSIS — I4811 Longstanding persistent atrial fibrillation: Secondary | ICD-10-CM | POA: Diagnosis not present

## 2020-02-25 DIAGNOSIS — Z7901 Long term (current) use of anticoagulants: Secondary | ICD-10-CM | POA: Diagnosis not present

## 2020-02-25 DIAGNOSIS — N179 Acute kidney failure, unspecified: Secondary | ICD-10-CM | POA: Diagnosis not present

## 2020-02-25 DIAGNOSIS — R110 Nausea: Secondary | ICD-10-CM | POA: Diagnosis not present

## 2020-02-25 DIAGNOSIS — I25118 Atherosclerotic heart disease of native coronary artery with other forms of angina pectoris: Secondary | ICD-10-CM | POA: Diagnosis not present

## 2020-02-25 DIAGNOSIS — F05 Delirium due to known physiological condition: Secondary | ICD-10-CM | POA: Diagnosis not present

## 2020-02-25 NOTE — Telephone Encounter (Signed)
Tim, Can you check some routine labs today---he is having nausea and some delirium. I want to make sure his blood count is okay and the renal function hasn't worsened. Thanks! Rich

## 2020-02-25 NOTE — Progress Notes (Signed)
Office Visit    Patient Name: Eduardo [name] Date of Encounter: 02/25/2020  Primary Care Provider:  Venia Carbon, MD Primary Cardiologist:  Ida Rogue, MD Electrophysiologist:  None   Chief Complaint    Eduardo [name] is a 86 y.o. male with a hx of CAD, chronic diastolic heart failure, permanent atrial fibrillation on Eliquis, bilateral carotid artery stenosis, HLD, COPD presents today for hospital follow-up  Past Medical History    Past Medical History:  Diagnosis Date  . Allergy   . Anemia 2012, 08/2015   chronic. Acute due to large hematoma 2013. 2017: due to gastric AVM bleeding.   . Anxiety   . CAD (coronary artery disease)    a. 06/2010 Cath: LM nl, LAD 50p/m, LCX 81m, RCA 50p/m -->catheter dissection but good flow, 70d, RPDA 50-70;  b. 06/2010 Relook cath due to bradycardia and inf ST elev: RCA dissection @ ostium extending into coronary cusp and prox RCA, Ao dissection-->less staining than previously-->Med Rx; c. 08/2018 MV: No ischemia.  . Chronic combined systolic (congestive) and diastolic (congestive) heart failure ([place])    a. 12/2014 Echo: EF 60-65%; b. 07/2016 Echo: EF 45-50%, mild LVH. Mild AS/MS. Mod-sev MR. Mild to mod TR. PASP 52mmHg; c. 08/2018 Echo: EF 55-60%, RVSP 68.29mmHg. Sev dil LA, mod dil RA. at least mod MR, mild to mod TR. Mild AS.  Marland Kitchen COPD (chronic obstructive pulmonary disease) ([place]) 2013   exertional dyspnea  . Gastric angiodysplasia with hemorrhage 08/2015  . Hx of colonic polyps 2001, 2011   adenomatous 2001. HP 2001, 2011.  Marland Kitchen Hyperlipidemia   . Mitral regurgitation    a. 07/2016 Echo: Mod to sev MR; b. 08/2018 Echo: at least moderate MR.  . Myocardial infarction ([place]) 06/2010.  12/2011.   "twice; 1 day apart; during/after cath". NQMI in setting severe anemia 2013.   Marland Kitchen Neuropathy 2014   in feet, likely due to spinal stenosis, DDD, HNP  . Osteoarthritis 2002   spinal stenosis, spondylolisthesis, disc protrusion multilevel in lumbar spine    . Permanent atrial fibrillation ([place]) 2012   a.  CHA2DS2VASc = 6-->eliquis;  b. 12/2015 Echo: EF 60-65%, no rwma, LVH, mild AS/MS/MR, sev dil LA, mild TR, PASP 29mmHg.  . Stroke ([place]) 2016  . Upper GI bleed 08/2015   EGD: 2 small gastric AVMs, 1 actively bleeding.  Both treated with APC ablation, clipping of the bleeder   Past Surgical History:  Procedure Laterality Date  . APPENDECTOMY    . CARDIAC CATHETERIZATION  2012   50% LAD and luminal irreg in RCA, 1/12  Post cath MI from damage  . CARPAL TUNNEL RELEASE     left  . CARPAL TUNNEL RELEASE  10/11/11   Dr Rush Barer  . CATARACT EXTRACTION W/ INTRAOCULAR LENS IMPLANT  2/13   Dr [name]  . COLONOSCOPY  2001, 02/2010   Dr Deatra Ina.   . ESOPHAGOGASTRODUODENOSCOPY N/A 08/15/2015   [name] MD; 2 small gastric AVMs, 1 actively bleeding.  Both treated with APC ablation, clipping of the bleeder  . FOOT FRACTURE SURGERY     right heel repair  . FRACTURE SURGERY    . KNEE ARTHROSCOPY     left, right knee 10/2009  . LEFT HEART CATH AND CORONARY ANGIOGRAPHY N/A 02/11/2020   Procedure: LEFT HEART CATH AND CORONARY ANGIOGRAPHY;  Surgeon: Belva Crome, MD;  Location: [facility];  Service: Cardiovascular;  Laterality: N/A;  . MASTOIDECTOMY  x3 on right  . NASAL SEPTUM SURGERY     nasal septal repair  . PENILE PROSTHESIS  REMOVAL    . PENILE PROSTHESIS IMPLANT     x 2--got infection after 2nd and had to remove  . PILONIDAL CYST / SINUS EXCISION    . SHOULDER ARTHROSCOPY     right  . TONSILLECTOMY AND ADENOIDECTOMY      Allergies  Allergies  Allergen Reactions  . Penicillins Itching    Has patient had a PCN reaction causing immediate rash, facial/tongue/throat swelling, SOB or lightheadedness with hypotension: Yes Has patient had a PCN reaction causing severe rash involving mucus membranes or skin necrosis: No Has patient had a PCN reaction that required hospitalization No Has patient had a PCN reaction  occurring within the last 10 years: No If all of the above answers are "NO", then may proceed with Cephalosporin use.   . Sulfonamide Derivatives Other (See Comments)    "it affected my kidneys"  . Tape Other (See Comments)    Arms black and blue, tears skin.  Please use "paper" tape    History of Present Illness    Eduardo [name] is a 86 y.o. male with a hx of CAD, chronic diastolic heart failure, prone atrial fibrillation on Eliquis, bilateral carotid artery stenosis, HLD, COPD last seen while hospitalized.  Cardiac cath in 2012 with nonobstructive CAD involving LAD and RCA.  He subsequently had RCA dissection which was treated medically.  He presented to the [facility], ED 02/07/2020 for acute onset chest pain while in bed.  EKG in the ED with atrial fibrillation with LBBB and PVCs/aberrant conduction.  No ischemic changes compared to prior.  At HS-troponin of 33 and 414 with peak at >27,000. CXR showed cardiomegaly with vascular congestion and probable interstitial edema.  Diagnosed with NSTEMI.  Echo while admitted LVEF 30-35%, global hypokinesis, moderate LVH, gradeIII diastolic dysfunction, moderate MR, mildly dilated ascending aorta.  EF reduced compared to previous (2000 1855 to 60%).  Cardiac catheterization 02/11/2020 (delayed due to AKI) with 78% proximal LAD lesion (thrombus or calcified nodular plaque), 99% stenosis of proximal RCA with left-to-right collaterals, 99 stenosis of first marginal vessel felt to be culprit lesion.  Noted severely elevated LVEDP on cath.  He was recommended for medical therapy.  Diuresis was limited by AKI with creatinine peak at 2.31, nephrology was consulted.  Due to AKI his home verapamil was transitioned to Toprol.  Fenofibrate discontinued.  Eliquis reduced to 2.5 mg twice daily.  Wilder Glade was started but subsequently discontinued due to worsening renal function.  He was discharged to SNF on regimen including Eliquis 2.5 mg twice daily, aspirin,  atorvastatin 80 mg, furosemide 40 mg daily, hydralazine 10 mg TID, Toprol 50 mg daily.  Message from Dr. [name] today requesting routine labs as Mr. Crossin is having some delerium and nausea.   Present today with his wife for follow up. History assisted by wife as he is noticeably lethargic today. Alert to self and place, but not aware of situation. Reports no chest pain, pressure, tightness. Reports no shortness of breath at rest. Endorses dyspnea on exertion. Wife is understandably concerned about bilateral LE 2+ pitting edema. He is trying to keep his feet up when sitting. Endorses not eating salt, but using salt substitute. Drinking less than 2L of fluid per day. Wife is very concerned about his delerium and confusion at [facility]. Is hopeful to be able to get him back home, but wants to do  so safely.   EKGs/Labs/Other Studies Reviewed:   The following studies were reviewed today: Echocardiogram 02/07/2020: Impressions: 1. Left ventricular ejection fraction, by estimation, is 30 to 35%. The  left ventricle has moderately decreased function. The left ventricle  demonstrates global hypokinesis. There is moderate left ventricular  hypertrophy. Left ventricular diastolic  parameters are consistent with Grade III diastolic dysfunction  (restrictive).   2. Right ventricular systolic function is normal. The right ventricular  size is normal. There is moderately elevated pulmonary artery systolic  pressure. The estimated right ventricular systolic pressure is 16.1 mmHg.   3. Left atrial size was severely dilated.   4. Right atrial size was moderately dilated.   5. The mitral valve is normal in structure. Moderate mitral valve  regurgitation. No evidence of mitral stenosis.   6. The aortic valve is tricuspid. Aortic valve regurgitation is not  visualized. No aortic stenosis is present.   7. Aortic dilatation noted. There is mild dilatation of the ascending  aorta measuring 37 mm.   8. The  inferior vena cava is dilated in size with <50% respiratory  variability, suggesting right atrial pressure of 15 mmHg.   Comparison(s): Prior images reviewed side by side. Changes from prior  study are noted. The left ventricular function is significantly worse.  Prior ECHO EF WAS NORMAL.   Renal Ultrasound 02/09/2020: Impressions: BILATERAL renal cysts. No renal sonographic abnormalities. Mild ascites and small RIGHT pleural effusion. _____________   Left Heart Catheterization 02/11/2020:  Widely patent left main  Proximal LAD 70 to 80% with either thrombus or calcified nodular plaque.  First diagonal 50% narrowed proximally.  First obtuse marginal circumflex and the distal third contains subtotal occlusion which could be an acute culprit lesion.  Functionally occluded right coronary with ostial/sinus of Valsalva aneurysm.  Left-to-right collaterals from LAD to PDA around the apex.  Severely elevated LVEDP consistent with acute on chronic combined systolic and diastolic heart failure.  Total contrast 27 cc   Recommendations:  Track creatinine  We will hydrate but patient will be at risk for heart failure.  Best therapy for CAD is medical management.   Diagnostic Dominance: Right  EKG:  EKG is ordered today.  The ekg ordered today demonstrates rate controlled atrial fibrillation 64 bpm with T wave inversion in lead V6.  No acute ST/T wave changes.  Recent Labs: 12/18/2019: ALT 19 02/09/2020: B Natriuretic Peptide 1,855.9 02/18/2020: BUN 65; Creatinine, Ser 2.05; Hemoglobin 10.0; Platelets 280; Potassium 4.3; Sodium 133  Recent Lipid Panel    Component Value Date/Time   CHOL 115 08/11/2019 1058   TRIG (H) 08/11/2019 1058    1142.0 Triglyceride is over 400; calculations on Lipids are invalid.   HDL 39.30 08/11/2019 1058   CHOLHDL 3 08/11/2019 1058   VLDL UNABLE TO CALCULATE IF TRIGLYCERIDE OVER 400 mg/dL 12/31/2014 0510   LDLCALC UNABLE TO CALCULATE IF TRIGLYCERIDE OVER  400 mg/dL 12/31/2014 0510   LDLDIRECT 65.0 08/11/2019 1058    Home Medications   Current Meds  Medication Sig  . albuterol (PROVENTIL) (2.5 MG/3ML) 0.083% nebulizer solution Use 1 vial in nebulizer every 4 hours as needed for wheezing or shortness of breath  . ALPRAZolam (XANAX) 0.25 MG tablet Take 1 tablet (0.25 mg total) by mouth 2 (two) times daily as needed for anxiety.  Marland Kitchen apixaban (ELIQUIS) 2.5 MG TABS tablet Take 1 tablet (2.5 mg total) by mouth 2 (two) times daily.  Marland Kitchen aspirin EC 81 MG EC tablet Take 1 tablet (81 mg  total) by mouth daily. Swallow whole.  Marland Kitchen atorvastatin (LIPITOR) 80 MG tablet Take 1 tablet (80 mg total) by mouth daily.  . B Complex-C (SUPER B COMPLEX PO) Take 1 tablet by mouth at bedtime.   . cetirizine (ZYRTEC) 10 MG tablet Take 10 mg by mouth at bedtime.   . Cholecalciferol (VITAMIN D) 50 MCG (2000 UT) CAPS Take 2,000 Units by mouth daily.   . Coenzyme Q10 ([employer] COQ-10 PO) Take 300 mg by mouth daily.   Marland Kitchen escitalopram (LEXAPRO) 20 MG tablet Take 20 mg by mouth at bedtime.   . ferrous sulfate 325 (65 FE) MG tablet Take 1 tablet (325 mg total) by mouth daily with breakfast.  . furosemide (LASIX) 40 MG tablet TAKE ONE TABLET BY MOUTH ONE TIME DAILY (Patient taking differently: Take 40 mg by mouth daily. )  . hydrALAZINE (APRESOLINE) 10 MG tablet TAKE ONE TABLET BY MOUTH THREE TIMES DAILY  (Patient taking differently: Take 10 mg by mouth 3 (three) times daily. )  . ipratropium (ATROVENT) 0.03 % nasal spray INSTILL Two sprays into both nostrils at bedtime (Patient taking differently: Place 2 sprays into both nostrils at bedtime. INSTILL TWO SPRAYS INTO BOTH NOSTRILS AT BEDTIME)  . lamoTRIgine (LAMICTAL) 150 MG tablet Take 150 mg by mouth at bedtime. Reported on 08/14/2015  . melatonin 1 MG TABS tablet Take 3 mg by mouth as needed.  . metoprolol succinate (TOPROL-XL) 50 MG 24 hr tablet Take 1 tablet (50 mg total) by mouth daily. Take with or immediately following a meal.  .  montelukast (SINGULAIR) 10 MG tablet TAKE ONE TABLET BY MOUTH DAILY AT BEDTIME  (Patient taking differently: Take 10 mg by mouth at bedtime. )  . Multiple Vitamin (MULTIVITAMIN) tablet Take 1 tablet by mouth at bedtime.   . NYSTATIN PO Take by mouth as needed.  . ondansetron (ZOFRAN) 4 MG tablet Take 4 mg by mouth as needed for nausea or vomiting.  . pantoprazole (PROTONIX) 40 MG tablet TAKE 1 TABLET BY MOUTH DAILY BEFORE BREAKFAST (Patient taking differently: Take 40 mg by mouth daily. TAKE 1 TABLET BY MOUTH DAILY BEFORE BREAKFAST.)  . polyethylene glycol (MIRALAX / GLYCOLAX) 17 g packet Take 17 g by mouth daily as needed for moderate constipation.   Marland Kitchen STIOLTO RESPIMAT 2.5-2.5 MCG/ACT AERS inhale 2 puffs by mouth into the lungs once daily (Patient taking differently: Inhale 2 puffs into the lungs daily. )  . triamcinolone cream (KENALOG) 0.1 % Apply 1 application topically 2 (two) times daily as needed (for skin).   . [DISCONTINUED] isosorbide mononitrate (IMDUR) 30 MG 24 hr tablet Take 0.5 tablets (15 mg total) by mouth daily.    Review of Systems      Review of Systems  Constitutional: Positive for malaise/fatigue. Negative for chills and fever.  Cardiovascular: Positive for dyspnea on exertion. Negative for chest pain, irregular heartbeat, leg swelling, near-syncope, orthopnea, palpitations and syncope.  Respiratory: Negative for cough, shortness of breath and wheezing.   Gastrointestinal: Negative for melena, nausea and vomiting.  Genitourinary: Negative for hematuria.  Neurological: Negative for dizziness, light-headedness and weakness.  Psychiatric/Behavioral: Positive for altered mental status (delerium in setting of SNF stay).   All other systems reviewed and are otherwise negative except as noted above.  Physical Exam    VS:  BP 108/60 (BP Location: Left Arm, Patient Position: Sitting, Cuff Size: Normal)   Pulse 67   Ht 5\' 2"  (1.575 m)   Wt 151 lb 8 oz (68.7 kg)  SpO2 90%    BMI 27.71 kg/m  , BMI Body mass index is 27.71 kg/m. GEN: Well nourished, well developed, in no acute distress. HEENT: normal. Neck: Supple, no JVD, carotid bruits, or masses. Cardiac: irregularly irregular, no murmurs, rubs, or gallops. No clubbing, cyanosis. Bilateral LE 2+ pitting edema.  Radials/DP/PT 2+ and equal bilaterally.  Respiratory:  Respirations regular and unlabored, clear to auscultation bilaterally. GI: Soft, nontender, nondistended. MS: No deformity or atrophy. Skin: Warm and dry, no rash. Bilateral upper extremity with scattered ecchymosis. R radial cath site without evidence of infection nor hematoma.  Neuro:  Strength and sensation are intact. Alert to person, place. Not alert to situation. Lethargic.  Psych: Normal affect.  Assessment & Plan    1. CAD s/p NSTEMI 02/07/2020- Reports no chest pain, pressure, tightness.  LHC 02/11/20 70-80% prox LAD lesion (thrombus or calcified nodular plaque), 99% stenosis of prox RCA with L-R collateral, 99% stenosis in 1st mrg vessel felt to be culprit. Recommended for medical therapy. Continue GDMT including Aspirin, Toprol, Atorvastatin. Stop Imdur 15mg  daily due to hypotension. R radial catheterization site with ecchymosis but no hematoma   2. Chronic combined CHF/ischemic cardiomyopathy- Bilateral lower extremities with 2+ pitting edema foot to knee. Weight up 3 pounds from hospital discharge. CMP today. Will change Lasix dose based on result via phone call to [facility]. Hesitant to change today due to creatinine 2.05 one week ago. Continue Lasix 40mg  daily. Additional GDMT includes Toprol, Hydralazine. Discontinue Imdur due to hypotension.   3. Chronic atrial fibrillation on anticoagulation- Rate controlled atrial fibrillation on EKG today with stable LBBB. Denies bleeding complications. Continue Eliquis 2.5mg  twice daily - reduced dose due to age, renal function. Continue Toprol 50mg  daily.   4. AKI- Noted during recent  admission which required adjustment of his medications. CMP today.   5. Hyperlipidemia, LDL goal less than 70- Continue Atorvastatin 80mg  daily.   Disposition: Follow up in 2-3 weeks with Dr. Rockey Situ or APP   Loel Dubonnet, NP 02/25/2020, 5:13 PM

## 2020-02-25 NOTE — Patient Instructions (Signed)
Medication Instructions:  Your physician has recommended you make the following change in your medication:  STOP Isosorbide Mononitrate (Imdur) due to low blood pressure  *If you need a refill on your cardiac medications before your next appointment, please call your pharmacy*  Lab Work: Your provider recommends that you return for lab work today: CMP, CBC  If you have labs (blood work) drawn today and your tests are completely normal, you will receive your results only by: Marland Kitchen MyChart Message (if you have MyChart) OR . A paper copy in the mail If you have any lab test that is abnormal or we need to change your treatment, we will call you to review the results.  Testing/Procedures: Your EKG today shows rate controlled atrial fibrillation   Follow-Up: At [facility], you and your health needs are our priority.  As part of our continuing mission to provide you with exceptional heart care, we have created designated Provider Care Teams.  These Care Teams include your primary Cardiologist (physician) and Advanced Practice Providers (APPs -  Physician Assistants and Nurse Practitioners) who all work together to provide you with the care you need, when you need it.  We recommend signing up for the patient portal called "MyChart".  Sign up information is provided on this After Visit Summary.  MyChart is used to connect with patients for Virtual Visits (Telemedicine).  Patients are able to view lab/test results, encounter notes, upcoming appointments, etc.  Non-urgent messages can be sent to your provider as well.   To learn more about what you can do with MyChart, go to [email or web address].    Your next appointment:   2-3 week(s)  The format for your next appointment:   In Person  Provider:    You may see Eduardo Rogue, MD or one of the following Advanced Practice Providers on your designated Care Team:    [name], NP  Christell Faith, PA-C  Laurann Montana, NP  Marrianne Mood, PA-C  Other Instructions  Continue low salt diet.  Recommend less than 2 liters of fluid per day.   Keep feet up when sitting.   Wear compression stockings during the daytime as tolerated.

## 2020-02-26 ENCOUNTER — Telehealth: Payer: Self-pay | Admitting: Family

## 2020-02-26 LAB — COMPREHENSIVE METABOLIC PANEL
ALT: 149 IU/L — ABNORMAL HIGH (ref 0–44)
AST: 104 IU/L — ABNORMAL HIGH (ref 0–40)
Albumin/Globulin Ratio: 1.6 (ref 1.2–2.2)
Albumin: 4 g/dL (ref 3.6–4.6)
Alkaline Phosphatase: 212 IU/L — ABNORMAL HIGH (ref 44–121)
BUN/Creatinine Ratio: 25 — ABNORMAL HIGH (ref 10–24)
BUN: 68 mg/dL — ABNORMAL HIGH (ref 8–27)
Bilirubin Total: 0.9 mg/dL (ref 0.0–1.2)
CO2: 27 mmol/L (ref 20–29)
Calcium: 9.6 mg/dL (ref 8.6–10.2)
Chloride: 91 mmol/L — ABNORMAL LOW (ref 96–106)
Creatinine, Ser: 2.74 mg/dL — ABNORMAL HIGH (ref 0.76–1.27)
GFR calc Af Amer: 23 mL/min/{1.73_m2} — ABNORMAL LOW (ref >59)
GFR calc non Af Amer: 20 mL/min/{1.73_m2} — ABNORMAL LOW (ref >59)
Globulin, Total: 2.5 g/dL (ref 1.5–4.5)
Glucose: 111 mg/dL — ABNORMAL HIGH (ref 65–99)
Potassium: 4.5 mmol/L (ref 3.5–5.2)
Sodium: 131 mmol/L — ABNORMAL LOW (ref 134–144)
Total Protein: 6.5 g/dL (ref 6.0–8.5)

## 2020-02-26 LAB — CBC
Hematocrit: 32.9 % — ABNORMAL LOW (ref 37.5–51.0)
Hemoglobin: 10.3 g/dL — ABNORMAL LOW (ref 13.0–17.7)
MCH: 29.2 pg (ref 26.6–33.0)
MCHC: 31.3 g/dL — ABNORMAL LOW (ref 31.5–35.7)
MCV: 93 fL (ref 79–97)
Platelets: 184 10*3/uL (ref 150–450)
RBC: 3.53 x10E6/uL — ABNORMAL LOW (ref 4.14–5.80)
RDW: 15.3 % (ref 11.6–15.4)
WBC: 8.5 10*3/uL (ref 3.4–10.8)

## 2020-02-26 MED ORDER — ATORVASTATIN CALCIUM 80 MG PO TABS: 40.0000 mg | ORAL_TABLET | Freq: Every day | ORAL | 3 refills | Status: AC

## 2020-02-26 NOTE — Telephone Encounter (Signed)
Confirmed with [facility] staff that faxed recommendations had been received.   Loel Dubonnet, NP

## 2020-02-26 NOTE — Telephone Encounter (Signed)
CBC, CMP collected at clinic visit yesterday. Results received and recommendations below. Spoke with RN at [facility] who requested changes be faxed to [phone]. Will route this note to them via Epic fax function and confirm received fax by phone [phone]). Will also route to Dr. [name] as he is Mr. Atha's PCP and MD at [facility].  CBC shows blood counts overall stable compared to previous (Hb 10.3, WBC 8.5, RBC 3.53, HCT 32.9). His WBC have returned to normal since hospital discharge.   Kidney function continues to decline (creatinine 2.74, GFR 20, BUN 68).  Sodium mildly low at 131, recommend strict <2L fluid restriction which will help with sodium as well as prevent fluid retention. Recommend HOLD Lasix for 2 days, then resume at [facility]m daily[facility] at time of 8:30am call. Of note, his AKI while hospitalized was felt to be due to acute tubular necrosis due to hypotension. We stopped Imdur during office visit yesterday due to hypotension. Recommend careful monitoring of BP at SNF with consideration of further reduction of antihypertensive agents as directed by MD.  His liver enzymes are also elevated (Alk Phos 212, AST 104, ALT 149) which is a new finding. For protection of liver enzymes, recommend REDUCE Atorvastatin to 415mdaily. Avoid usage of Acetaminophen.   He will require repeat CMP in 1 week. [facility] Le[facility]ate  CaLoel DubonnetNP

## 2020-02-28 NOTE — Telephone Encounter (Signed)
Challenging case Previously seen by Dr. Linna Hoff January 2021 for pulmonary hypertension, also with diastolic and systolic heart failure Now with worsening renal dysfunction creatinine 2.7 on Lasix, transaminitis concerning for hepatic congestion Worsening leg swelling Recent hospitalization non-STEMI with chronically occluded RCA collaterals left to right also with severe proximal LAD disease medical management recommended EF 30-35, markedly elevated left ventricular end-diastolic pressure on catheterization, Inability to get much fluid off and hospitalization secondary to renal dysfunction  Options are probably limited but will CC Dr. Linna Hoff B Given severe underlying lung disease, medication options may be limited Not sure if low-dose sildenafil would be of much benefit, and likely not a good candidate for advanced therapies Suspect he would not be a candidate for dialysis Thanks to all TG

## 2020-02-29 DIAGNOSIS — N184 Chronic kidney disease, stage 4 (severe): Secondary | ICD-10-CM | POA: Diagnosis not present

## 2020-02-29 DIAGNOSIS — K219 Gastro-esophageal reflux disease without esophagitis: Secondary | ICD-10-CM

## 2020-02-29 DIAGNOSIS — I5043 Acute on chronic combined systolic (congestive) and diastolic (congestive) heart failure: Secondary | ICD-10-CM | POA: Diagnosis not present

## 2020-02-29 DIAGNOSIS — J189 Pneumonia, unspecified organism: Secondary | ICD-10-CM | POA: Diagnosis not present

## 2020-02-29 DIAGNOSIS — J449 Chronic obstructive pulmonary disease, unspecified: Secondary | ICD-10-CM | POA: Diagnosis not present

## 2020-02-29 NOTE — Telephone Encounter (Signed)
Thanks for your input Tim. I had to restart his furosemide this weekend, and antibiotics for worsening of respiratory status this weekend. I generally chose diuresis over preventing AKI to prevent dyspnea.

## 2020-03-01 DIAGNOSIS — R410 Disorientation, unspecified: Secondary | ICD-10-CM | POA: Diagnosis not present

## 2020-03-01 DIAGNOSIS — N184 Chronic kidney disease, stage 4 (severe): Secondary | ICD-10-CM | POA: Diagnosis not present

## 2020-03-01 DIAGNOSIS — J181 Lobar pneumonia, unspecified organism: Secondary | ICD-10-CM | POA: Diagnosis not present

## 2020-03-01 DIAGNOSIS — I5032 Chronic diastolic (congestive) heart failure: Secondary | ICD-10-CM | POA: Diagnosis not present

## 2020-03-07 DIAGNOSIS — N184 Chronic kidney disease, stage 4 (severe): Secondary | ICD-10-CM | POA: Diagnosis not present

## 2020-03-07 DIAGNOSIS — I5042 Chronic combined systolic (congestive) and diastolic (congestive) heart failure: Secondary | ICD-10-CM | POA: Diagnosis not present

## 2020-03-07 DIAGNOSIS — I25119 Atherosclerotic heart disease of native coronary artery with unspecified angina pectoris: Secondary | ICD-10-CM | POA: Diagnosis not present

## 2020-03-07 DIAGNOSIS — F05 Delirium due to known physiological condition: Secondary | ICD-10-CM | POA: Diagnosis not present

## 2020-03-10 ENCOUNTER — Ambulatory Visit: Payer: PPO | Admitting: Family

## 2020-03-11 NOTE — Assessment & Plan Note (Signed)
Please continue Stiolto 2 puffs once daily as you have been taking it. Keep your albuterol available to use 2 puffs if needed for shortness of breath, chest tightness, wheezing. Please continue your Zyrtec and Atrovent nasal spray as you have been using it Keep Mucinex DM available to use as needed for congestion, cough, especially during the allergy season. Follow with Dr Lamonte Sakai in 6 months or sooner if you have any problems

## 2020-03-11 DEATH — deceased

## 2020-03-14 ENCOUNTER — Ambulatory Visit: Payer: PPO | Admitting: Internal Medicine

## 2020-03-15 IMAGING — DX DG RIBS W/ CHEST 3+V*R*
5 series · 5 of 5 positions shown · non-contrast
Comparison: Chest x-ray 07/28/2018.  Rib series 03/24/2018.

CLINICAL DATA: Right anterolateral rib cage pain.

EXAM:
RIGHT RIBS AND CHEST - 3+ VIEW

[rib pa (1 of 3)]
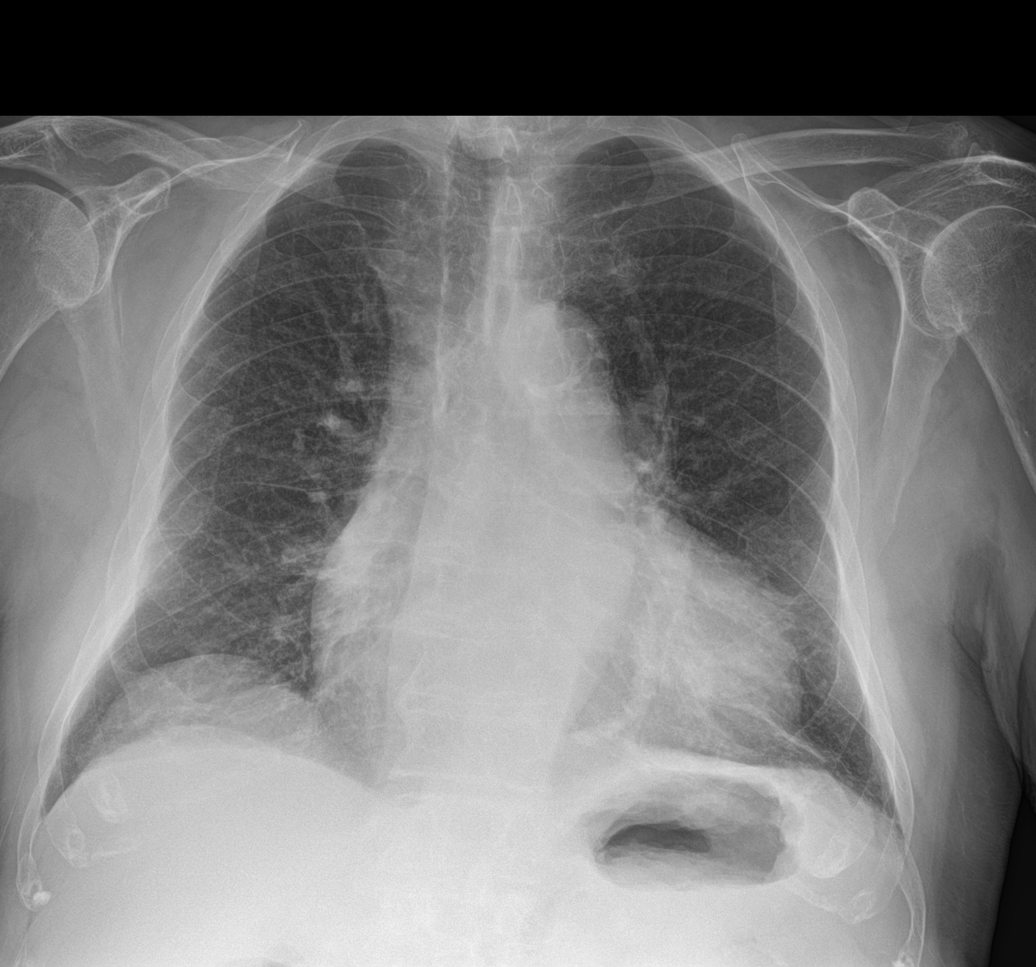

[rib obl (1 of 2)]
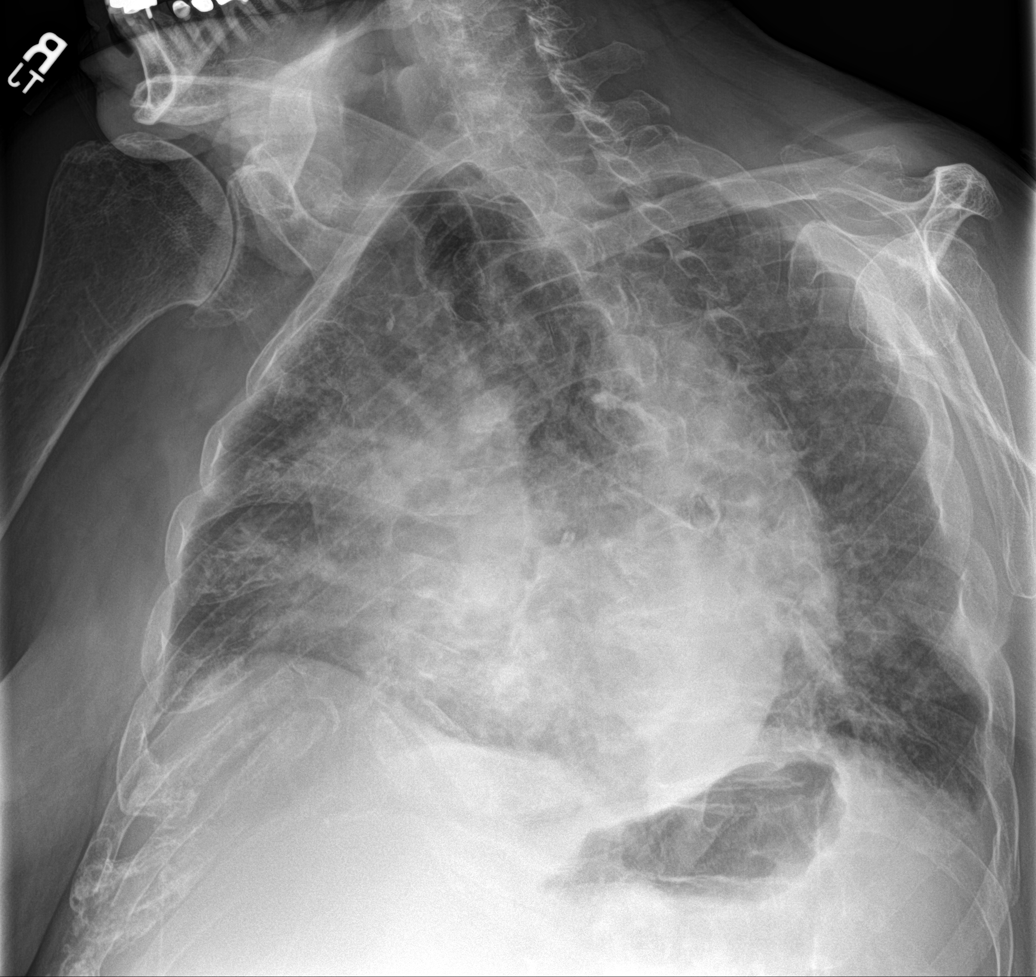

[rib obl (2 of 2)]
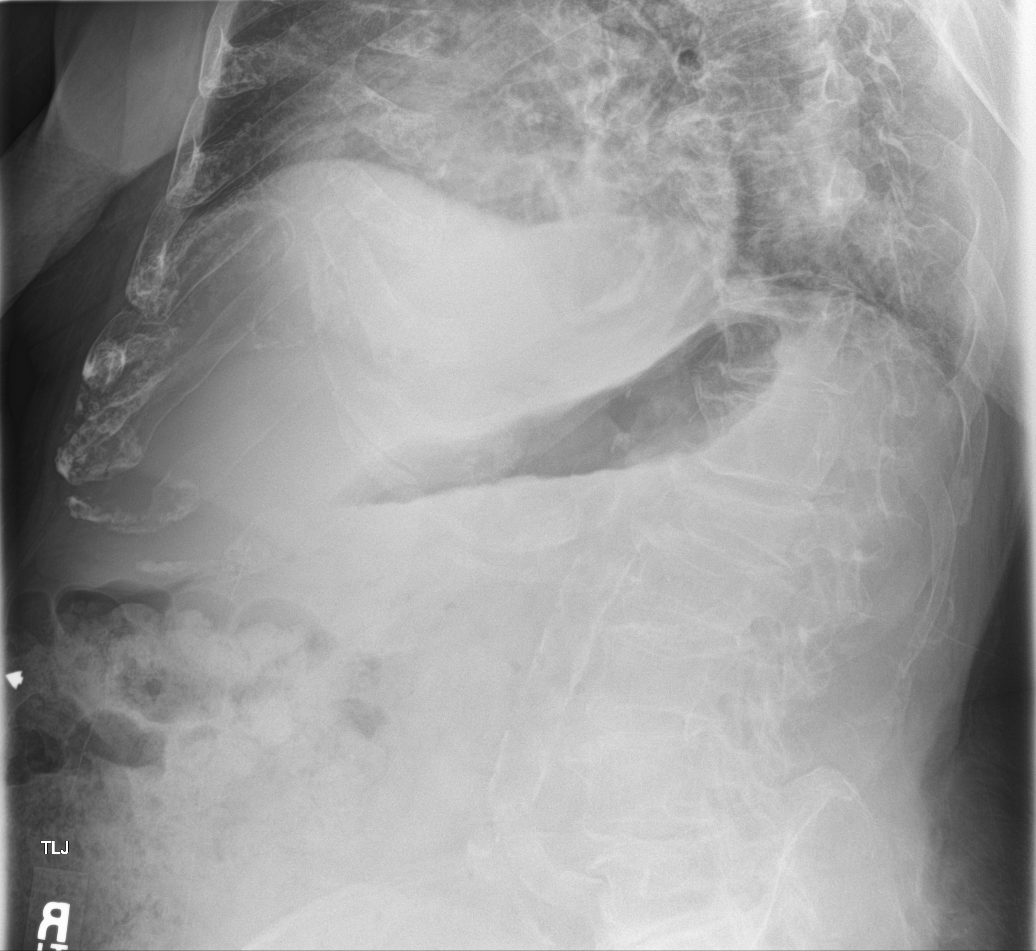

[rib pa (2 of 3)]
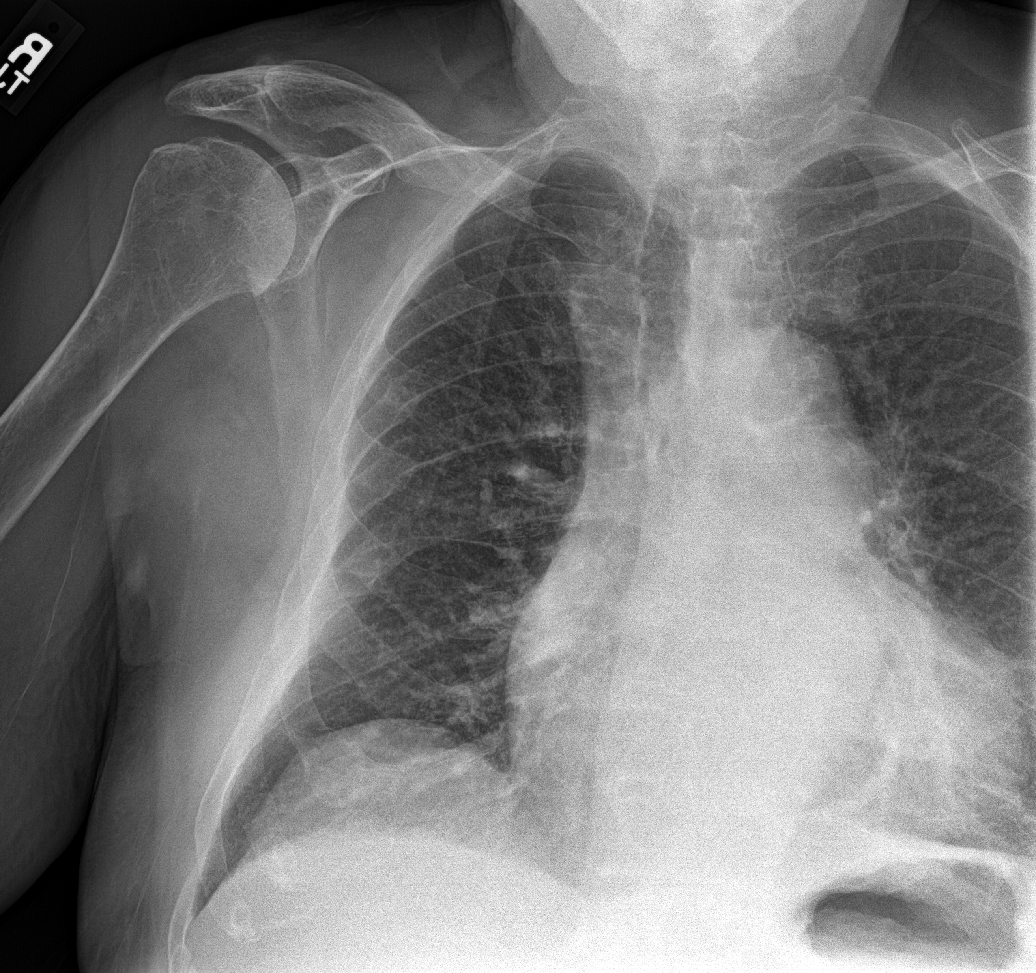

[rib pa (3 of 3)]
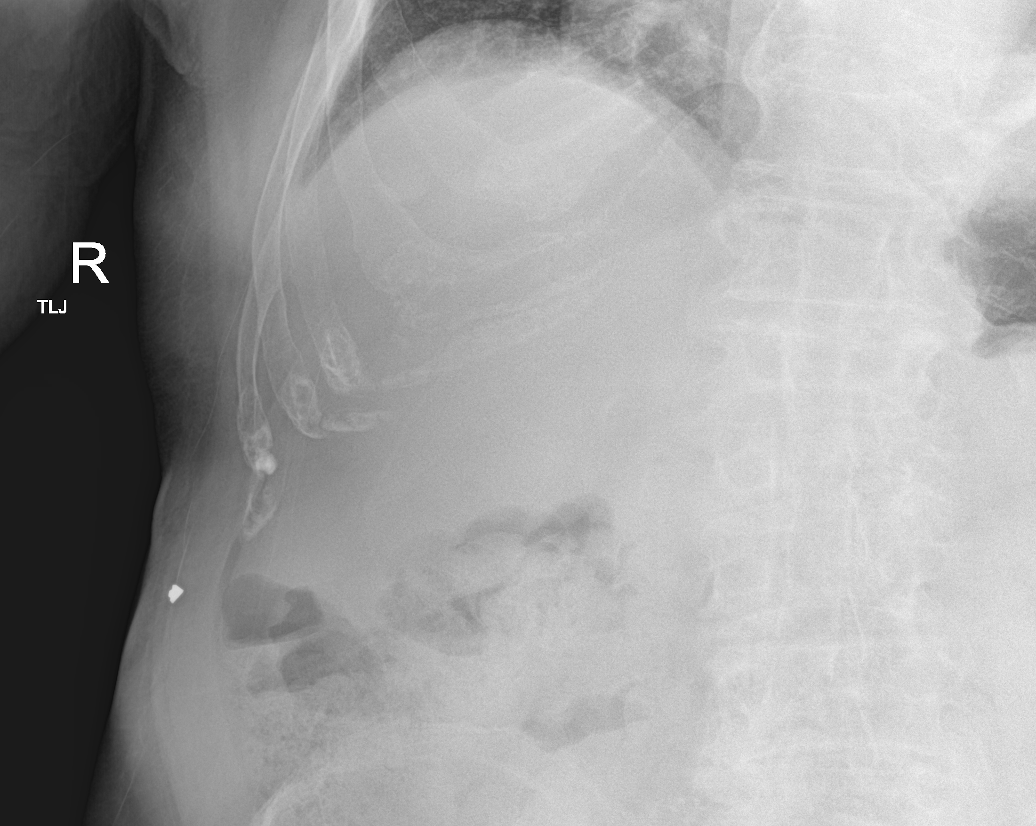

[5 of 5 positions shown; findings below may reference images not displayed]

FINDINGS: Mediastinum and hilar structures normal. Mitral annular
calcification again noted. Stable cardiomegaly. No pulmonary venous
congestion. Mild bibasilar subsegmental atelectasis and or scarring
again noted. No focal infiltrate. No pleural effusion or
pneumothorax. Old right clavicular fracture again noted. Multiple
old right rib fractures are noted. No evidence of acute rib fracture
or focal abnormality. Stable upper lumbar compression fracture
noted.
IMPRESSION: 1. No acute cardiopulmonary disease. Stable cardiomegaly. Mild
bibasilar subsegmental atelectasis and or scarring again noted.

2. Old right clavicular fracture again noted. Multiple old right rib
fractures again noted. No acute rib fracture or focal rib
abnormality identified.

3.  Stable upper lumbar compression fracture.

## 2020-05-23 ENCOUNTER — Ambulatory Visit: Payer: PPO | Admitting: Cardiovascular Disease

## 2020-07-05 ENCOUNTER — Ambulatory Visit: Payer: PPO | Admitting: Internal Medicine

## 2020-09-21 IMAGING — US US RENAL
1 series · 14 of 25 positions shown · non-contrast
Comparison: None

CLINICAL DATA: Acute kidney failure; history coronary artery
disease post MI, CHF, hypertension, COPD, former smoker

EXAM:
RENAL / URINARY TRACT ULTRASOUND COMPLETE

[Series 1: us renal · 14 of 48 slices shown]
[im 1/48]
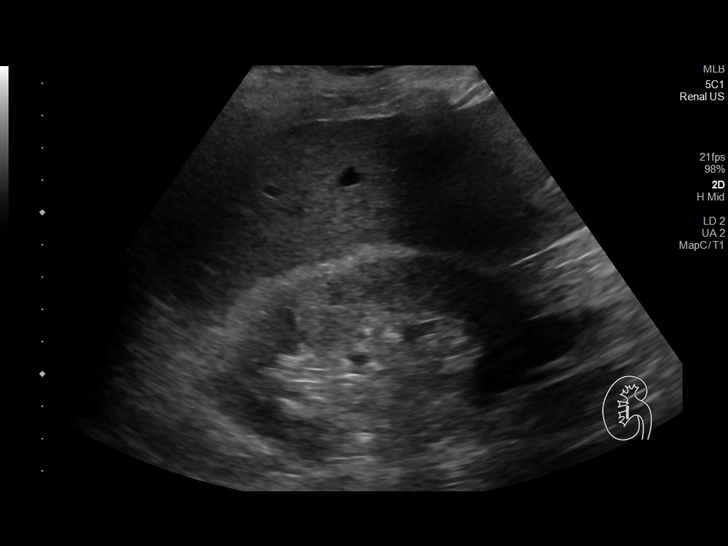
[im 4/48]
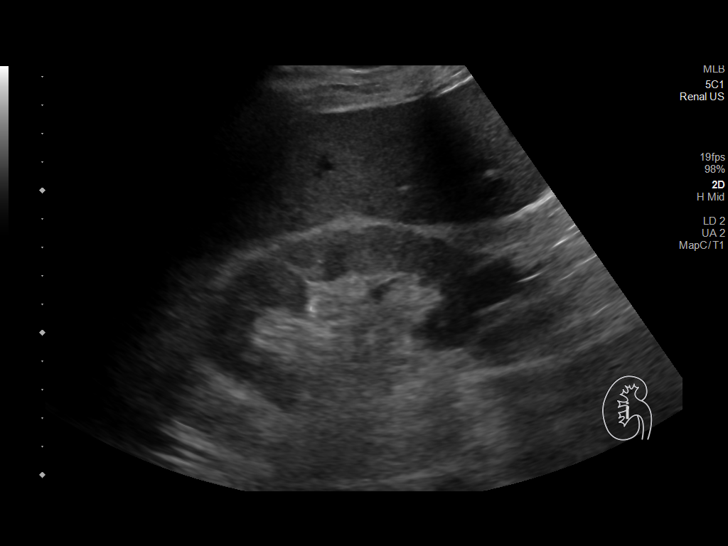
[im 8/48]
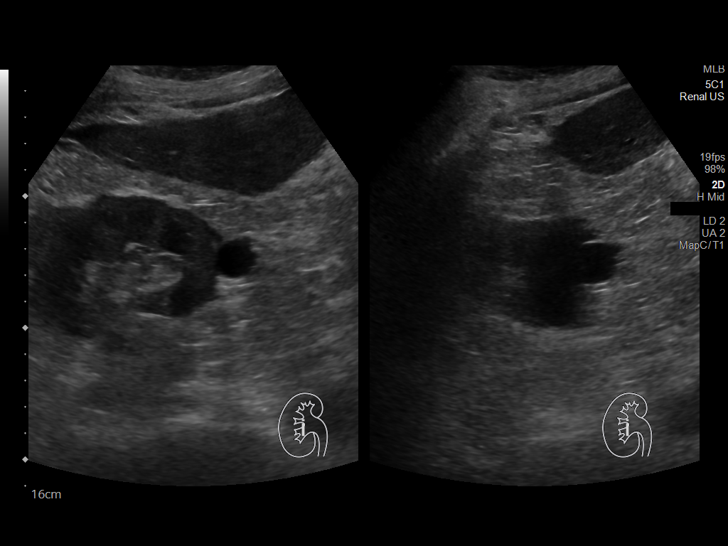
[im 12/48]
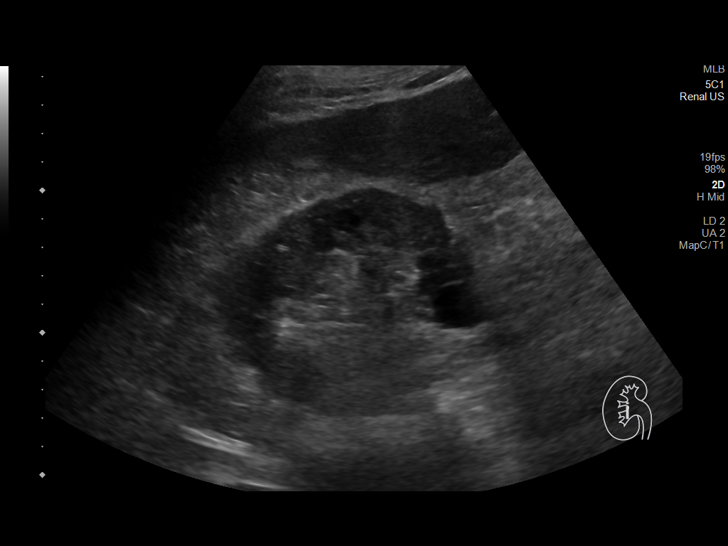
[im 16/48]
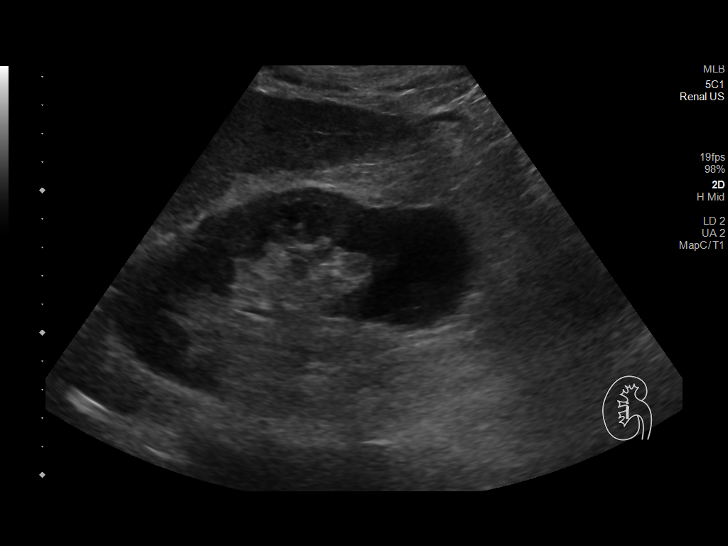
[im 18/48]
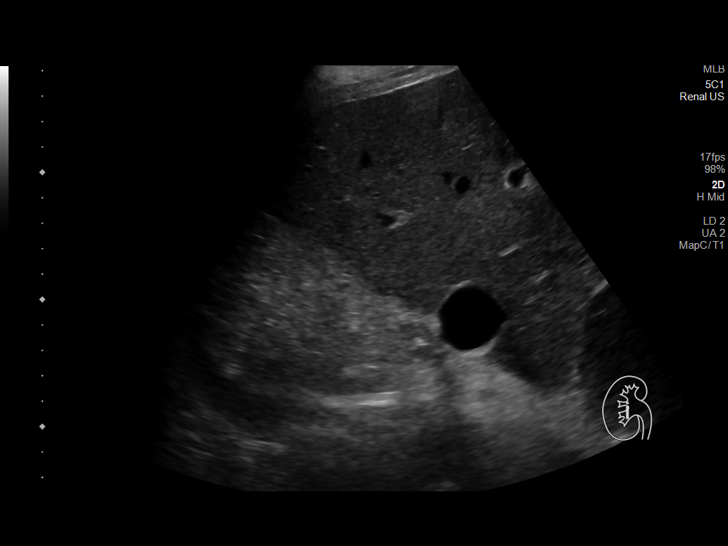
[im 22/48]
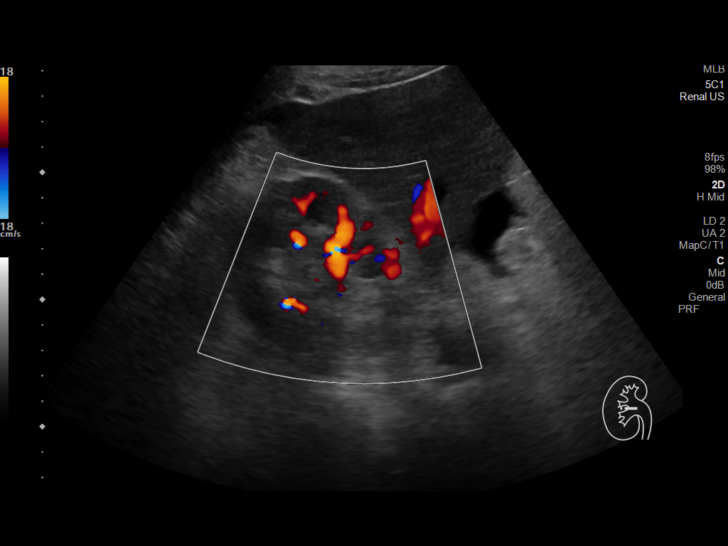
[im 26/48]
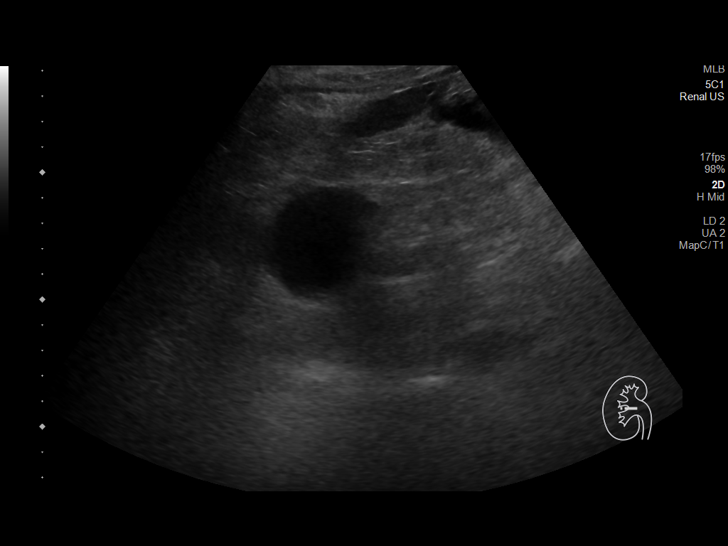
[im 30/48]
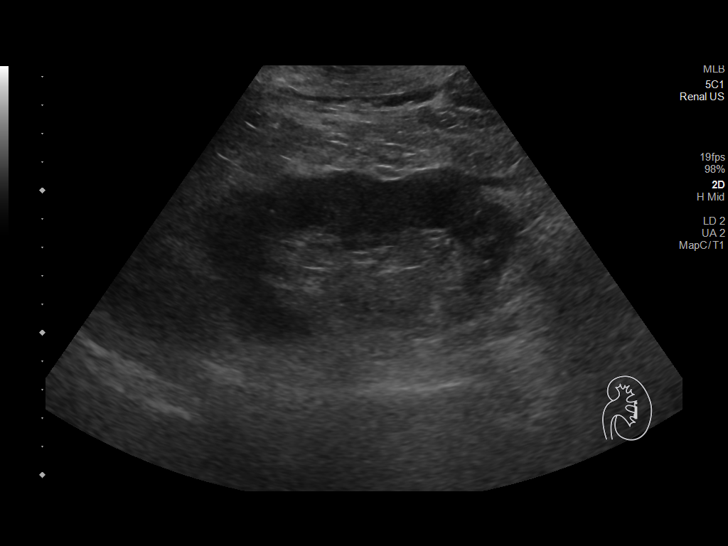
[im 32/48]
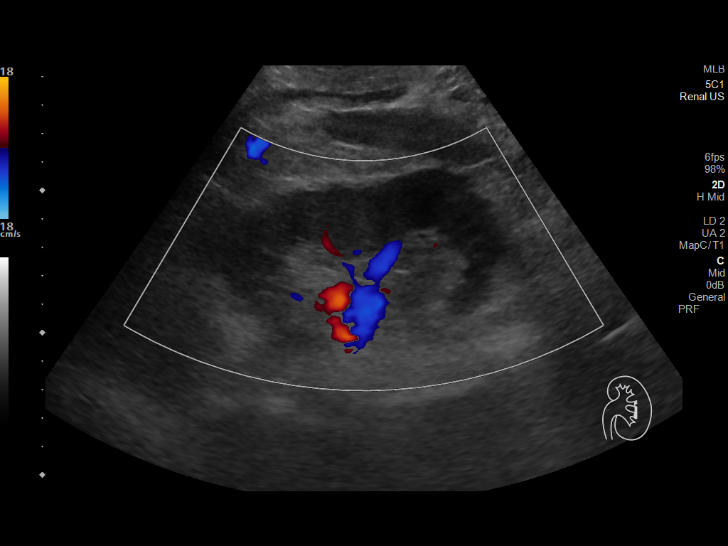
[im 36/48]
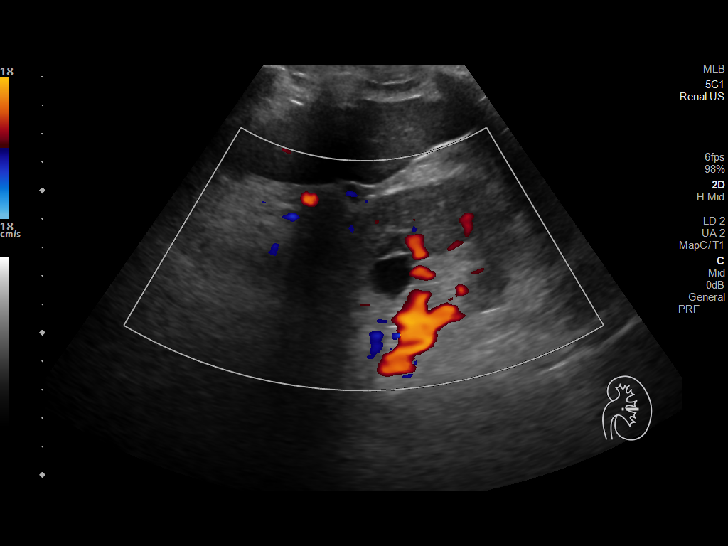
[im 40/48]
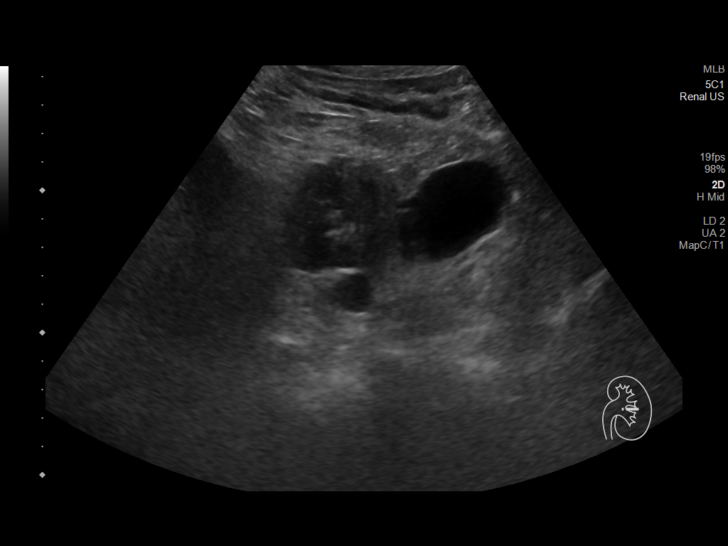
[im 44/48]
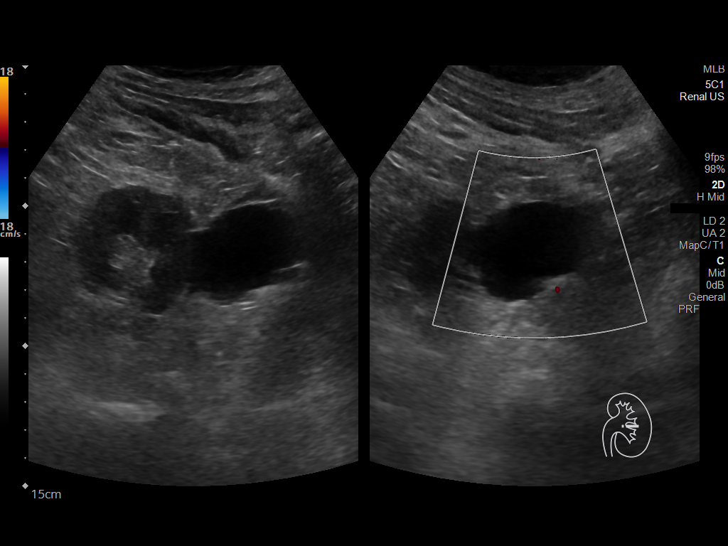
[im 48/48]
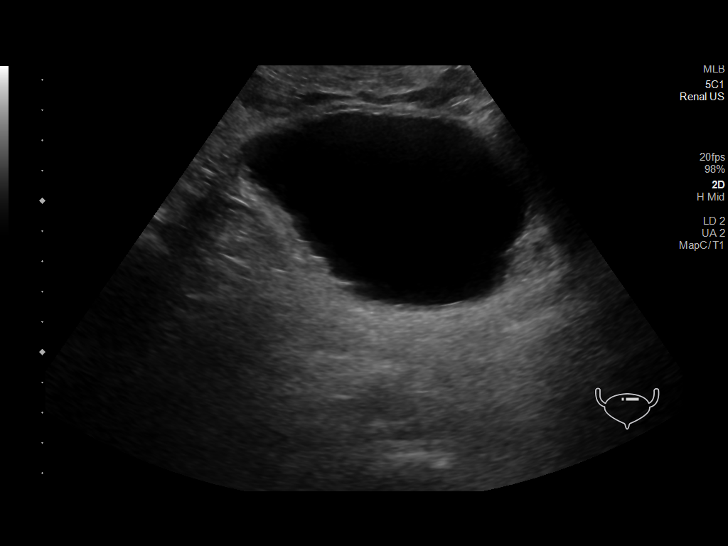

[14 of 25 positions shown; findings below may reference images not displayed]

FINDINGS: Right Kidney:

Renal measurements: 11.1 x 5.5 x 6.0 cm = volume: 92 mL. Normal
cortical thickness. Upper normal cortical echogenicity. Large cyst
at inferior pole 3.9 x 4.6 x 3.9 cm, simple features. Additional
tiny cyst inferior pole 1.7 x 1.6 x 1.6 cm, simple features. No
solid mass or hydronephrosis. No shadowing calcification.

Left Kidney:

Renal measurements: 11.3 x 5.5 x 4.5 cm = volume: 144 mL. Normal
cortical thickness and echogenicity. Two simple appearing LEFT renal
cysts are identified, 1.5 x 2.0 x 1.6 cm at upper pole and 4.0 x
x 3.4 cm at inferior pole. No additional mass or hydronephrosis.

Bladder:

Appears normal for degree of bladder distention.

Other:

Incidentally noted mild ascites and small RIGHT pleural effusion.
IMPRESSION: BILATERAL renal cysts.

No renal sonographic abnormalities.

Mild ascites and small RIGHT pleural effusion.
